# Patient Record
Sex: Male | Born: 1950 | Race: White | Hispanic: No | Marital: Single | Smoking: Never smoker
Health system: Southern US, Community
[De-identification: ages and names within clinical notes are randomized; demographics above are authoritative.]

## PROBLEM LIST (undated history)

## (undated) DIAGNOSIS — I480 Paroxysmal atrial fibrillation: Secondary | ICD-10-CM

## (undated) DIAGNOSIS — I272 Pulmonary hypertension, unspecified: Secondary | ICD-10-CM

## (undated) DIAGNOSIS — E1165 Type 2 diabetes mellitus with hyperglycemia: Secondary | ICD-10-CM

## (undated) DIAGNOSIS — I255 Ischemic cardiomyopathy: Secondary | ICD-10-CM

## (undated) DIAGNOSIS — N200 Calculus of kidney: Secondary | ICD-10-CM

## (undated) DIAGNOSIS — N183 Chronic kidney disease, stage 3 unspecified: Secondary | ICD-10-CM

## (undated) DIAGNOSIS — L409 Psoriasis, unspecified: Secondary | ICD-10-CM

## (undated) DIAGNOSIS — I10 Essential (primary) hypertension: Secondary | ICD-10-CM

## (undated) DIAGNOSIS — I219 Acute myocardial infarction, unspecified: Secondary | ICD-10-CM

## (undated) DIAGNOSIS — I5020 Unspecified systolic (congestive) heart failure: Secondary | ICD-10-CM

## (undated) DIAGNOSIS — I4891 Unspecified atrial fibrillation: Secondary | ICD-10-CM

## (undated) DIAGNOSIS — D509 Iron deficiency anemia, unspecified: Secondary | ICD-10-CM

## (undated) DIAGNOSIS — T7840XA Allergy, unspecified, initial encounter: Secondary | ICD-10-CM

## (undated) DIAGNOSIS — I429 Cardiomyopathy, unspecified: Secondary | ICD-10-CM

## (undated) DIAGNOSIS — Z87442 Personal history of urinary calculi: Secondary | ICD-10-CM

## (undated) DIAGNOSIS — N289 Disorder of kidney and ureter, unspecified: Secondary | ICD-10-CM

## (undated) DIAGNOSIS — IMO0002 Reserved for concepts with insufficient information to code with codable children: Secondary | ICD-10-CM

## (undated) DIAGNOSIS — H269 Unspecified cataract: Secondary | ICD-10-CM

## (undated) DIAGNOSIS — I509 Heart failure, unspecified: Secondary | ICD-10-CM

## (undated) DIAGNOSIS — F32A Depression, unspecified: Secondary | ICD-10-CM

## (undated) DIAGNOSIS — I2510 Atherosclerotic heart disease of native coronary artery without angina pectoris: Secondary | ICD-10-CM

## (undated) DIAGNOSIS — G4730 Sleep apnea, unspecified: Secondary | ICD-10-CM

## (undated) DIAGNOSIS — Z9109 Other allergy status, other than to drugs and biological substances: Secondary | ICD-10-CM

## (undated) DIAGNOSIS — I5042 Chronic combined systolic (congestive) and diastolic (congestive) heart failure: Secondary | ICD-10-CM

## (undated) DIAGNOSIS — H409 Unspecified glaucoma: Secondary | ICD-10-CM

## (undated) DIAGNOSIS — F419 Anxiety disorder, unspecified: Secondary | ICD-10-CM

## (undated) DIAGNOSIS — D649 Anemia, unspecified: Secondary | ICD-10-CM

## (undated) HISTORY — DX: Acute myocardial infarction, unspecified: I21.9

## (undated) HISTORY — DX: Anemia, unspecified: D64.9

## (undated) HISTORY — DX: Unspecified glaucoma: H40.9

## (undated) HISTORY — DX: Ischemic cardiomyopathy: I25.5

## (undated) HISTORY — PX: CARDIAC CATHETERIZATION: SHX172

## (undated) HISTORY — PX: EYE SURGERY: SHX253

## (undated) HISTORY — DX: Anxiety disorder, unspecified: F41.9

## (undated) HISTORY — PX: CORONARY ANGIOPLASTY WITH STENT PLACEMENT: SHX49

## (undated) HISTORY — DX: Allergy, unspecified, initial encounter: T78.40XA

## (undated) HISTORY — DX: Unspecified systolic (congestive) heart failure: I50.20

## (undated) HISTORY — DX: Depression, unspecified: F32.A

## (undated) HISTORY — DX: Paroxysmal atrial fibrillation: I48.0

## (undated) HISTORY — PX: NEPHRECTOMY: SHX65

## (undated) HISTORY — PX: KIDNEY SURGERY: SHX687

## (undated) HISTORY — DX: Atherosclerotic heart disease of native coronary artery without angina pectoris: I25.10

## (undated) HISTORY — DX: Unspecified cataract: H26.9

---

## 2015-06-18 IMAGING — US US RENAL
1 series · 14 of 25 positions shown · non-contrast
Comparison: Radiographs today.

CLINICAL DATA: Left-sided kidney stone.  Difficulty urinating.

EXAM:
RENAL / URINARY TRACT ULTRASOUND COMPLETE

[Series 1: us renal · 0.33mm/px · 14 of 63 slices shown]
[im 1/63]
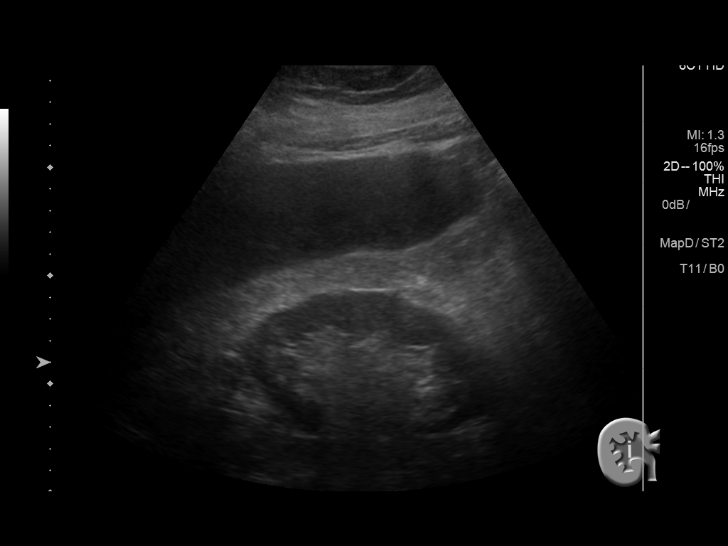
[im 6/63]
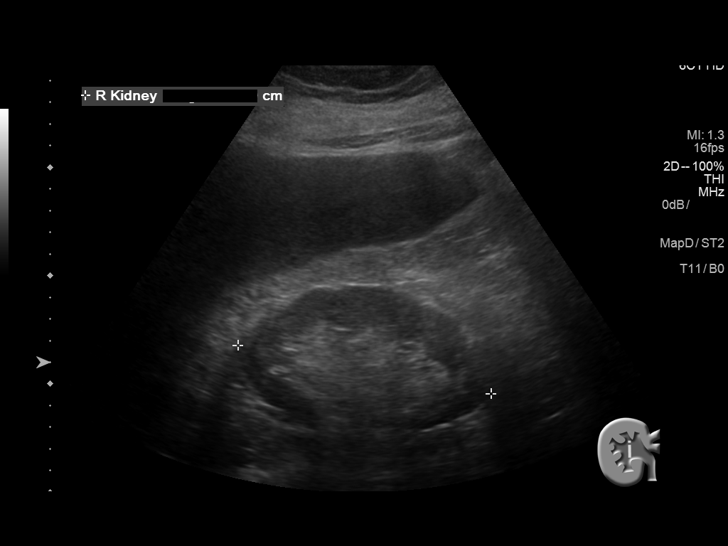
[im 11/63]
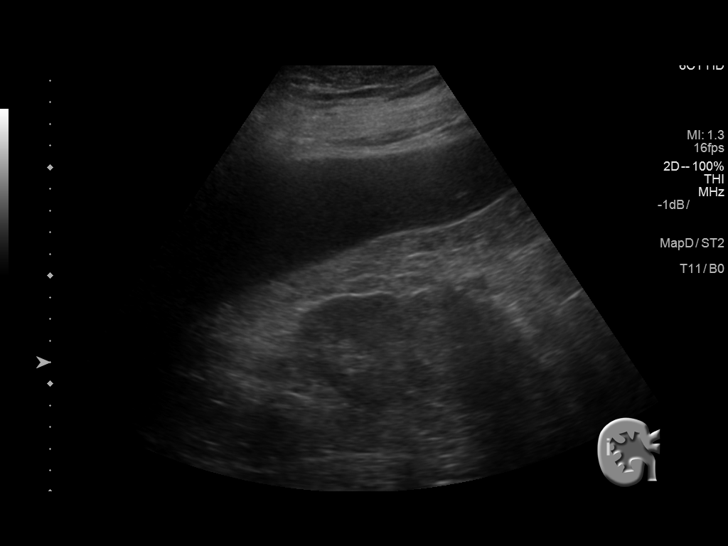
[im 16/63]
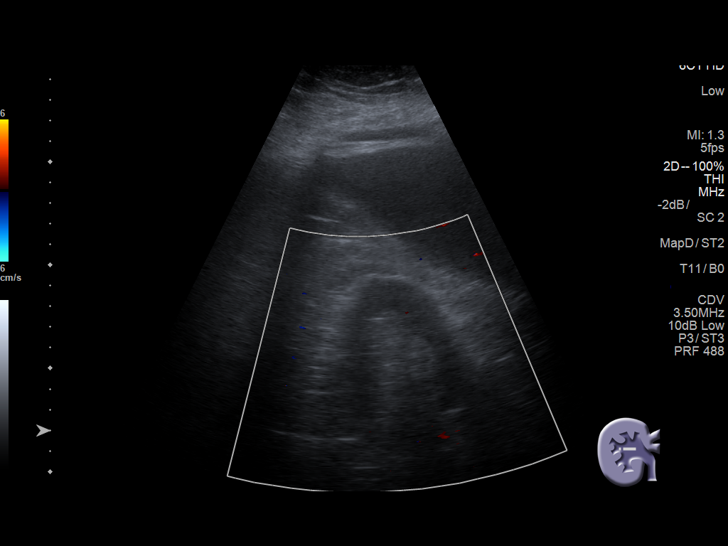
[im 21/63]
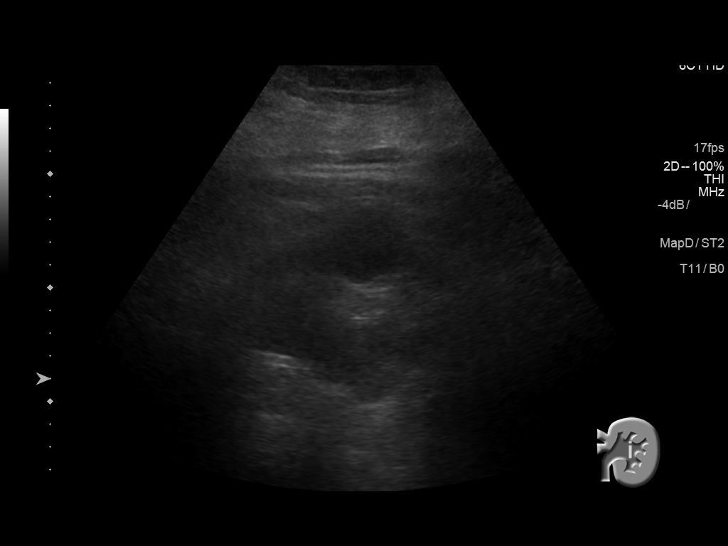
[im 24/63]
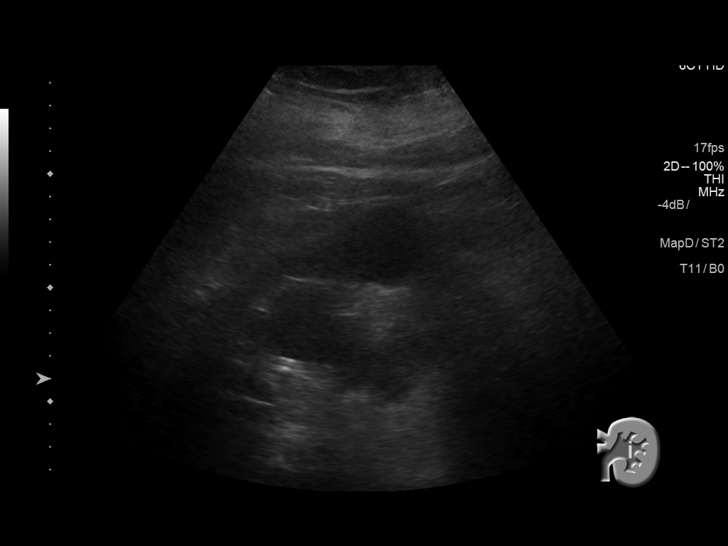
[im 29/63]
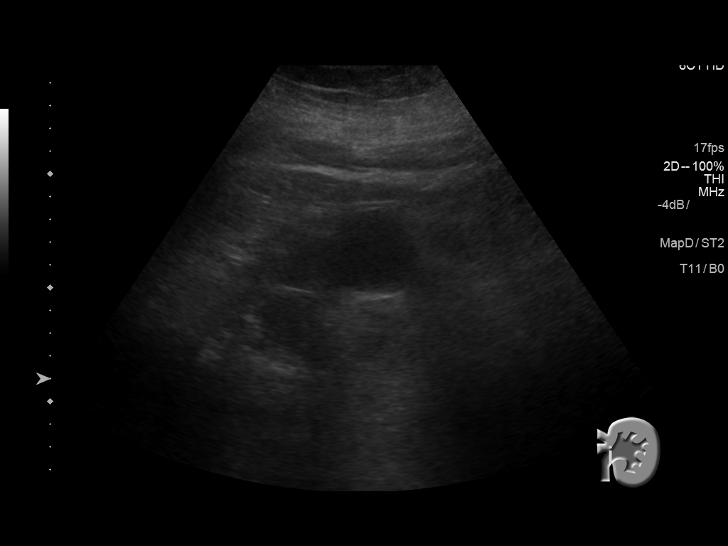
[im 34/63]
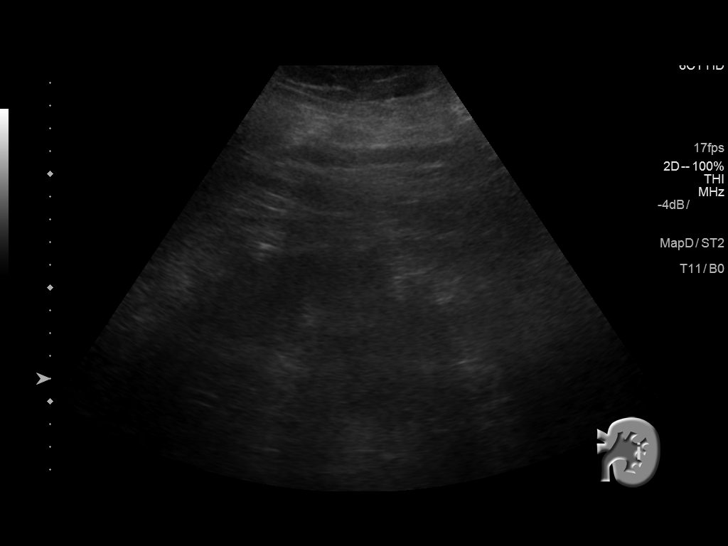
[im 39/63]
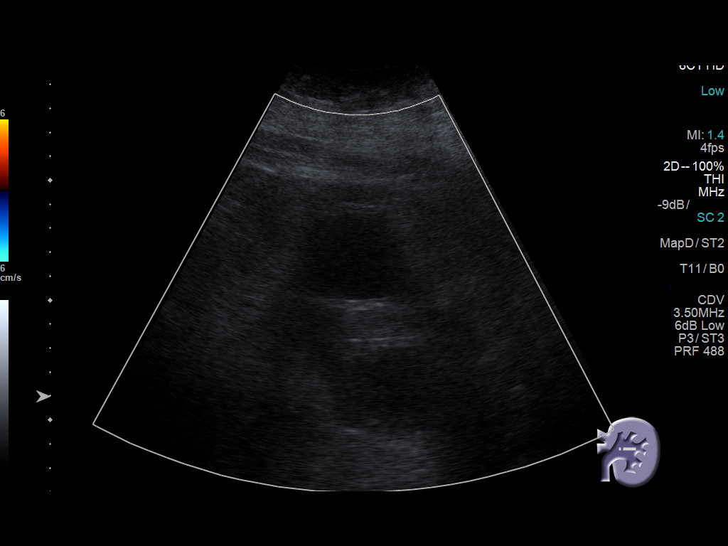
[im 42/63]
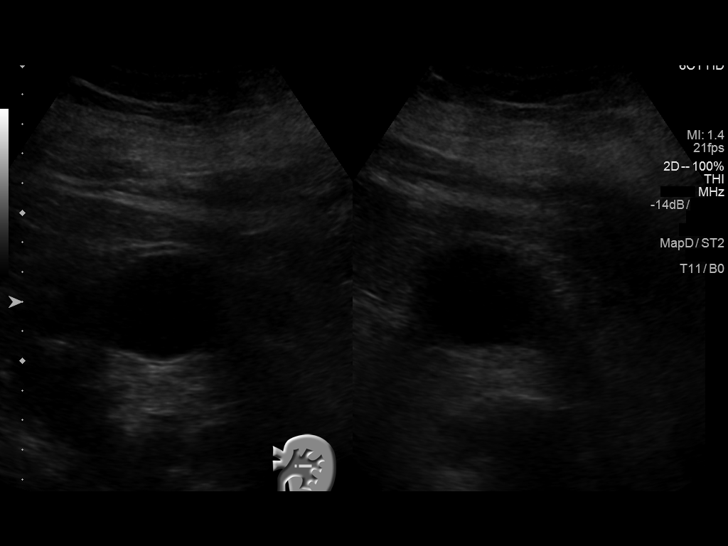
[im 47/63]
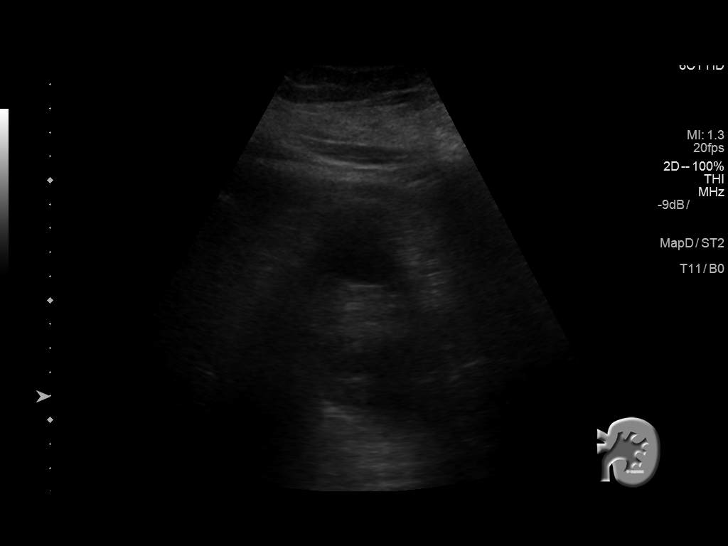
[im 52/63]
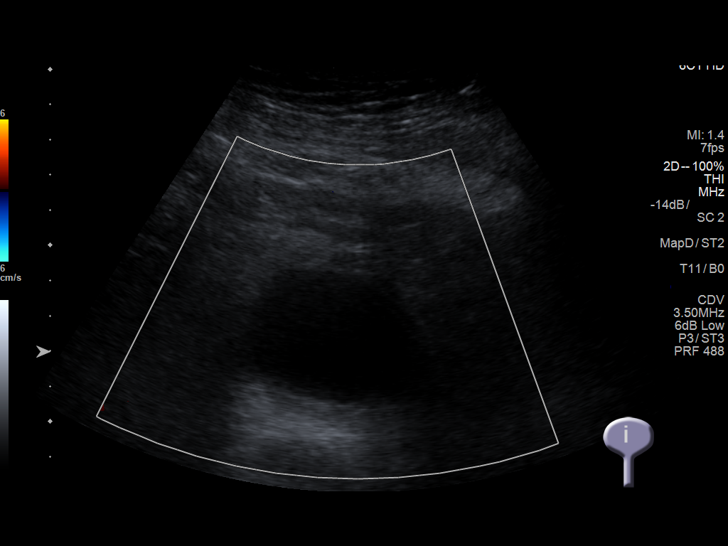
[im 57/63]
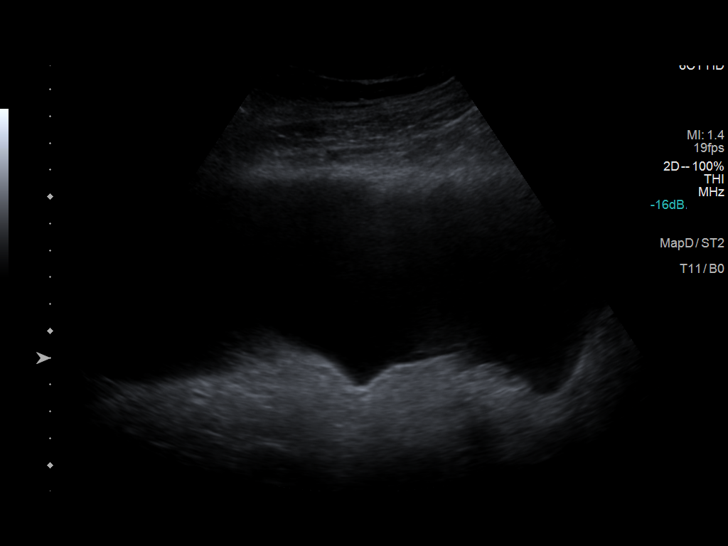
[im 63/63]
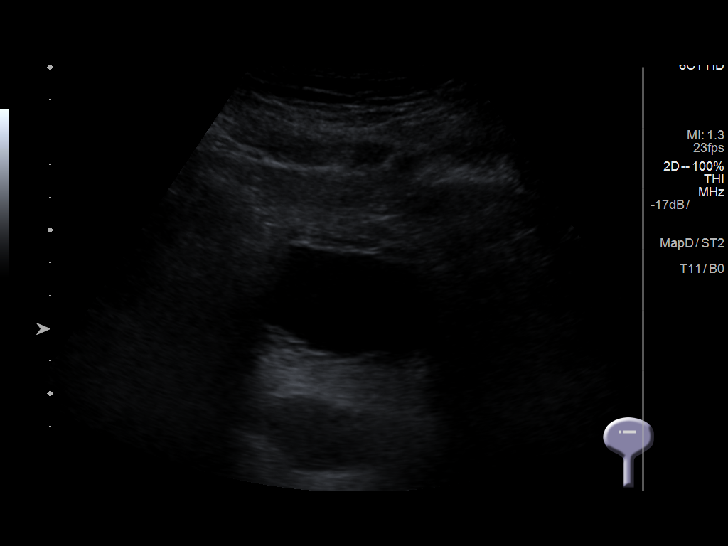

[14 of 25 positions shown; findings below may reference images not displayed]

FINDINGS: Right Kidney:

Length: 11.9 cm. Echogenicity within normal limits. No mass or
hydronephrosis visualized.

Left Kidney:

Length: 14.1 cm. The left kidney is suboptimally visualized, partly
obscured by bowel gas. There is a probable cyst in the interpolar
region measuring approximately 4.1 x 3.6 x 4.1 cm. The left renal
pelvis appears mildly distended. The calcifications demonstrated on
the radiographs are not well seen, probably within the ureter.

Bladder:

Appears normal for degree of bladder distention. Ureteral jets were
not visualized. Pelvic ascites noted.
IMPRESSION: 1. Suboptimal visualization of the left kidney due to bowel gas. The
left renal pelvis appears dilated, suggesting obstruction of the
left ureter by the calcifications demonstrated on radiographs.
2. Left renal cyst.
3. Pelvic ascites.
4. CT suggested for further evaluation.

## 2015-07-08 ENCOUNTER — Encounter: Payer: Self-pay | Admitting: Emergency Medicine

## 2015-07-08 ENCOUNTER — Emergency Department: Payer: Medicaid Other

## 2015-07-08 ENCOUNTER — Inpatient Hospital Stay
Admission: EM | Admit: 2015-07-08 | Discharge: 2015-08-01 | DRG: 286 | Disposition: A | Discharge status: Home Health | Payer: Medicaid Other | Attending: Internal Medicine | Admitting: Internal Medicine

## 2015-07-08 DIAGNOSIS — G4733 Obstructive sleep apnea (adult) (pediatric): Secondary | ICD-10-CM

## 2015-07-08 DIAGNOSIS — N200 Calculus of kidney: Secondary | ICD-10-CM

## 2015-07-08 DIAGNOSIS — N179 Acute kidney failure, unspecified: Secondary | ICD-10-CM | POA: Diagnosis present

## 2015-07-08 DIAGNOSIS — Z87442 Personal history of urinary calculi: Secondary | ICD-10-CM | POA: Diagnosis not present

## 2015-07-08 DIAGNOSIS — D696 Thrombocytopenia, unspecified: Secondary | ICD-10-CM | POA: Diagnosis present

## 2015-07-08 DIAGNOSIS — Z8249 Family history of ischemic heart disease and other diseases of the circulatory system: Secondary | ICD-10-CM | POA: Diagnosis not present

## 2015-07-08 DIAGNOSIS — I5023 Acute on chronic systolic (congestive) heart failure: Secondary | ICD-10-CM | POA: Diagnosis not present

## 2015-07-08 DIAGNOSIS — Z79899 Other long term (current) drug therapy: Secondary | ICD-10-CM

## 2015-07-08 DIAGNOSIS — Z7984 Long term (current) use of oral hypoglycemic drugs: Secondary | ICD-10-CM | POA: Diagnosis not present

## 2015-07-08 DIAGNOSIS — E871 Hypo-osmolality and hyponatremia: Secondary | ICD-10-CM | POA: Diagnosis not present

## 2015-07-08 DIAGNOSIS — I959 Hypotension, unspecified: Secondary | ICD-10-CM | POA: Diagnosis present

## 2015-07-08 DIAGNOSIS — I130 Hypertensive heart and chronic kidney disease with heart failure and stage 1 through stage 4 chronic kidney disease, or unspecified chronic kidney disease: Secondary | ICD-10-CM | POA: Diagnosis present

## 2015-07-08 DIAGNOSIS — E1122 Type 2 diabetes mellitus with diabetic chronic kidney disease: Secondary | ICD-10-CM | POA: Diagnosis present

## 2015-07-08 DIAGNOSIS — N132 Hydronephrosis with renal and ureteral calculous obstruction: Secondary | ICD-10-CM | POA: Diagnosis present

## 2015-07-08 DIAGNOSIS — K5900 Constipation, unspecified: Secondary | ICD-10-CM | POA: Diagnosis present

## 2015-07-08 DIAGNOSIS — Z955 Presence of coronary angioplasty implant and graft: Secondary | ICD-10-CM

## 2015-07-08 DIAGNOSIS — F419 Anxiety disorder, unspecified: Secondary | ICD-10-CM | POA: Diagnosis present

## 2015-07-08 DIAGNOSIS — I272 Other secondary pulmonary hypertension: Secondary | ICD-10-CM | POA: Diagnosis present

## 2015-07-08 DIAGNOSIS — N183 Chronic kidney disease, stage 3 (moderate): Secondary | ICD-10-CM | POA: Diagnosis present

## 2015-07-08 DIAGNOSIS — I878 Other specified disorders of veins: Secondary | ICD-10-CM | POA: Diagnosis present

## 2015-07-08 DIAGNOSIS — I5041 Acute combined systolic (congestive) and diastolic (congestive) heart failure: Secondary | ICD-10-CM | POA: Diagnosis not present

## 2015-07-08 DIAGNOSIS — I951 Orthostatic hypotension: Secondary | ICD-10-CM | POA: Diagnosis present

## 2015-07-08 DIAGNOSIS — I248 Other forms of acute ischemic heart disease: Secondary | ICD-10-CM | POA: Diagnosis present

## 2015-07-08 DIAGNOSIS — IMO0002 Reserved for concepts with insufficient information to code with codable children: Secondary | ICD-10-CM

## 2015-07-08 DIAGNOSIS — I1310 Hypertensive heart and chronic kidney disease without heart failure, with stage 1 through stage 4 chronic kidney disease, or unspecified chronic kidney disease: Secondary | ICD-10-CM | POA: Diagnosis present

## 2015-07-08 DIAGNOSIS — I340 Nonrheumatic mitral (valve) insufficiency: Secondary | ICD-10-CM | POA: Diagnosis present

## 2015-07-08 DIAGNOSIS — I2510 Atherosclerotic heart disease of native coronary artery without angina pectoris: Secondary | ICD-10-CM | POA: Diagnosis present

## 2015-07-08 DIAGNOSIS — I472 Ventricular tachycardia: Secondary | ICD-10-CM | POA: Diagnosis not present

## 2015-07-08 DIAGNOSIS — I5043 Acute on chronic combined systolic (congestive) and diastolic (congestive) heart failure: Secondary | ICD-10-CM | POA: Diagnosis present

## 2015-07-08 DIAGNOSIS — I255 Ischemic cardiomyopathy: Secondary | ICD-10-CM | POA: Diagnosis present

## 2015-07-08 DIAGNOSIS — I4891 Unspecified atrial fibrillation: Secondary | ICD-10-CM | POA: Diagnosis not present

## 2015-07-08 DIAGNOSIS — R7989 Other specified abnormal findings of blood chemistry: Secondary | ICD-10-CM

## 2015-07-08 DIAGNOSIS — I509 Heart failure, unspecified: Secondary | ICD-10-CM

## 2015-07-08 DIAGNOSIS — Z452 Encounter for adjustment and management of vascular access device: Secondary | ICD-10-CM

## 2015-07-08 DIAGNOSIS — E1165 Type 2 diabetes mellitus with hyperglycemia: Secondary | ICD-10-CM | POA: Diagnosis present

## 2015-07-08 DIAGNOSIS — I480 Paroxysmal atrial fibrillation: Secondary | ICD-10-CM | POA: Diagnosis present

## 2015-07-08 DIAGNOSIS — N1330 Unspecified hydronephrosis: Secondary | ICD-10-CM

## 2015-07-08 DIAGNOSIS — R778 Other specified abnormalities of plasma proteins: Secondary | ICD-10-CM | POA: Diagnosis present

## 2015-07-08 DIAGNOSIS — I5021 Acute systolic (congestive) heart failure: Secondary | ICD-10-CM | POA: Diagnosis not present

## 2015-07-08 DIAGNOSIS — N189 Chronic kidney disease, unspecified: Secondary | ICD-10-CM

## 2015-07-08 DIAGNOSIS — Z936 Other artificial openings of urinary tract status: Secondary | ICD-10-CM

## 2015-07-08 DIAGNOSIS — Z6841 Body Mass Index (BMI) 40.0 and over, adult: Secondary | ICD-10-CM

## 2015-07-08 DIAGNOSIS — R0600 Dyspnea, unspecified: Secondary | ICD-10-CM | POA: Diagnosis not present

## 2015-07-08 DIAGNOSIS — E785 Hyperlipidemia, unspecified: Secondary | ICD-10-CM | POA: Diagnosis present

## 2015-07-08 DIAGNOSIS — T829XXA Unspecified complication of cardiac and vascular prosthetic device, implant and graft, initial encounter: Secondary | ICD-10-CM

## 2015-07-08 HISTORY — DX: Sleep apnea, unspecified: G47.30

## 2015-07-08 HISTORY — DX: Unspecified atrial fibrillation: I48.91

## 2015-07-08 HISTORY — DX: Chronic kidney disease, stage 3 (moderate): N18.3

## 2015-07-08 HISTORY — DX: Chronic kidney disease, stage 3 unspecified: N18.30

## 2015-07-08 HISTORY — DX: Essential (primary) hypertension: I10

## 2015-07-08 HISTORY — DX: Chronic combined systolic (congestive) and diastolic (congestive) heart failure: I50.42

## 2015-07-08 HISTORY — DX: Heart failure, unspecified: I50.9

## 2015-07-08 HISTORY — DX: Disorder of kidney and ureter, unspecified: N28.9

## 2015-07-08 HISTORY — DX: Type 2 diabetes mellitus with hyperglycemia: E11.65

## 2015-07-08 HISTORY — DX: Reserved for concepts with insufficient information to code with codable children: IMO0002

## 2015-07-08 LAB — CBC
HCT: 41.2 % (ref 40.0–52.0)
Hemoglobin: 13.4 g/dL (ref 13.0–18.0)
MCH: 32.3 pg (ref 26.0–34.0)
MCHC: 32.5 g/dL (ref 32.0–36.0)
MCV: 99.4 fL (ref 80.0–100.0)
PLATELETS: 178 10*3/uL (ref 150–440)
RBC: 4.15 MIL/uL — AB (ref 4.40–5.90)
RDW: 14.8 % — ABNORMAL HIGH (ref 11.5–14.5)
WBC: 6.3 10*3/uL (ref 3.8–10.6)

## 2015-07-08 LAB — URINALYSIS COMPLETE WITH MICROSCOPIC (ARMC ONLY)
Bilirubin Urine: NEGATIVE
GLUCOSE, UA: NEGATIVE mg/dL
HGB URINE DIPSTICK: NEGATIVE
Ketones, ur: NEGATIVE mg/dL
Nitrite: NEGATIVE
PROTEIN: NEGATIVE mg/dL
Specific Gravity, Urine: 1.011 (ref 1.005–1.030)
pH: 5 (ref 5.0–8.0)

## 2015-07-08 LAB — BASIC METABOLIC PANEL
Anion gap: 11 (ref 5–15)
BUN: 33 mg/dL — ABNORMAL HIGH (ref 6–20)
CALCIUM: 9.7 mg/dL (ref 8.9–10.3)
CO2: 30 mmol/L (ref 22–32)
CREATININE: 1.48 mg/dL — AB (ref 0.61–1.24)
Chloride: 98 mmol/L — ABNORMAL LOW (ref 101–111)
GFR calc non Af Amer: 49 mL/min — ABNORMAL LOW (ref >60)
GFR, EST AFRICAN AMERICAN: 56 mL/min — AB (ref >60)
GLUCOSE: 318 mg/dL — AB (ref 65–99)
Potassium: 4.7 mmol/L (ref 3.5–5.1)
Sodium: 139 mmol/L (ref 135–145)

## 2015-07-08 LAB — TROPONIN I: TROPONIN I: 0.11 ng/mL — AB (ref <0.031)

## 2015-07-08 LAB — GLUCOSE, CAPILLARY: GLUCOSE-CAPILLARY: 251 mg/dL — AB (ref 65–99)

## 2015-07-08 IMAGING — CR DG CHEST 2V
1 series · 2 of 2 positions shown · non-contrast
Comparison: None.

CLINICAL DATA: Patient with a history of chf and cardiomyopathy.
Patient states that since [REDACTED] he has developed shortness of
breath, bilateral leg swelling and vertigo. Patient states that over
the last week the symptoms have become worse.

EXAM:
CHEST  2 VIEW

[Series 1: dg chest 2 view · 0.14mm/px · 2 of 2 slices shown]
[im 1/2]
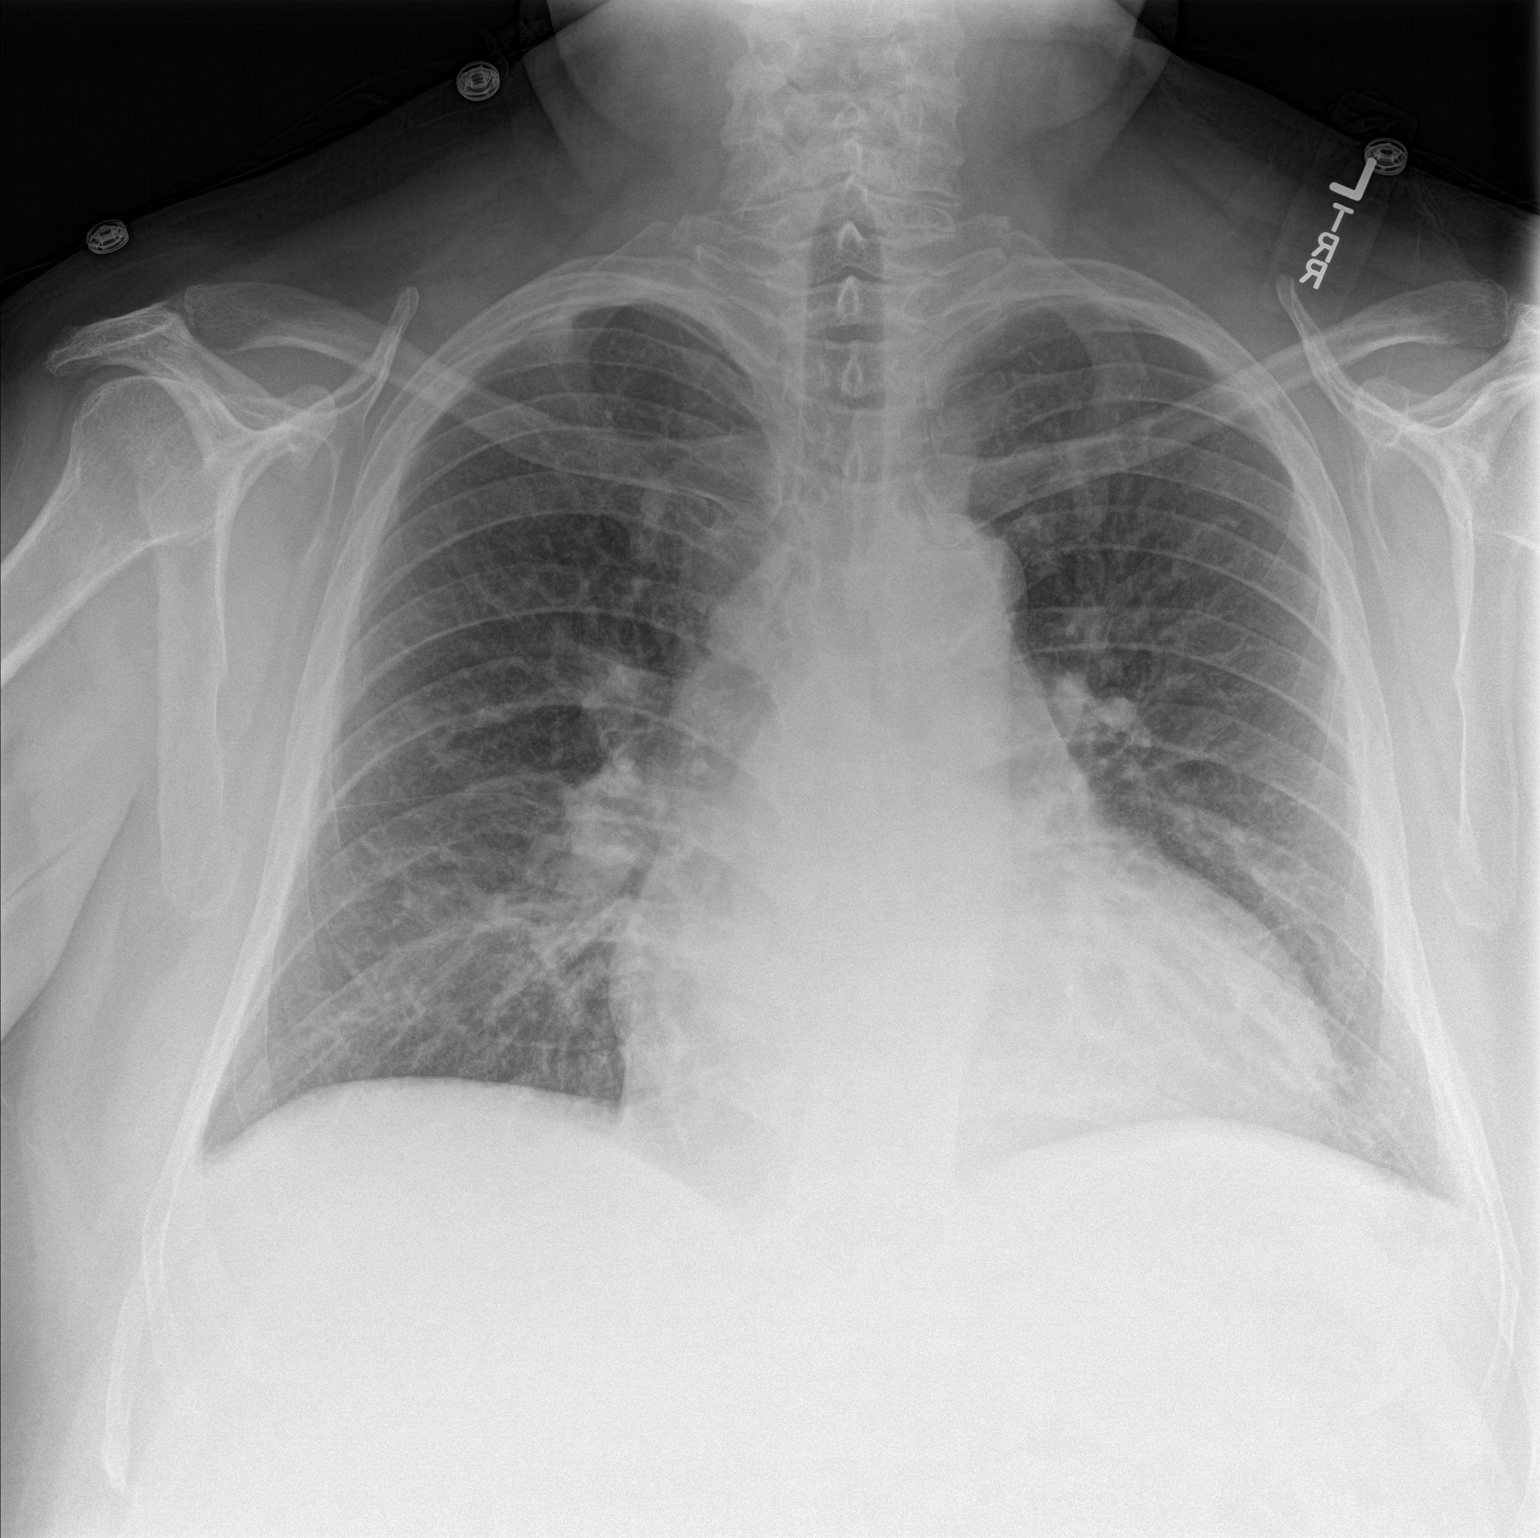
[im 2/2]
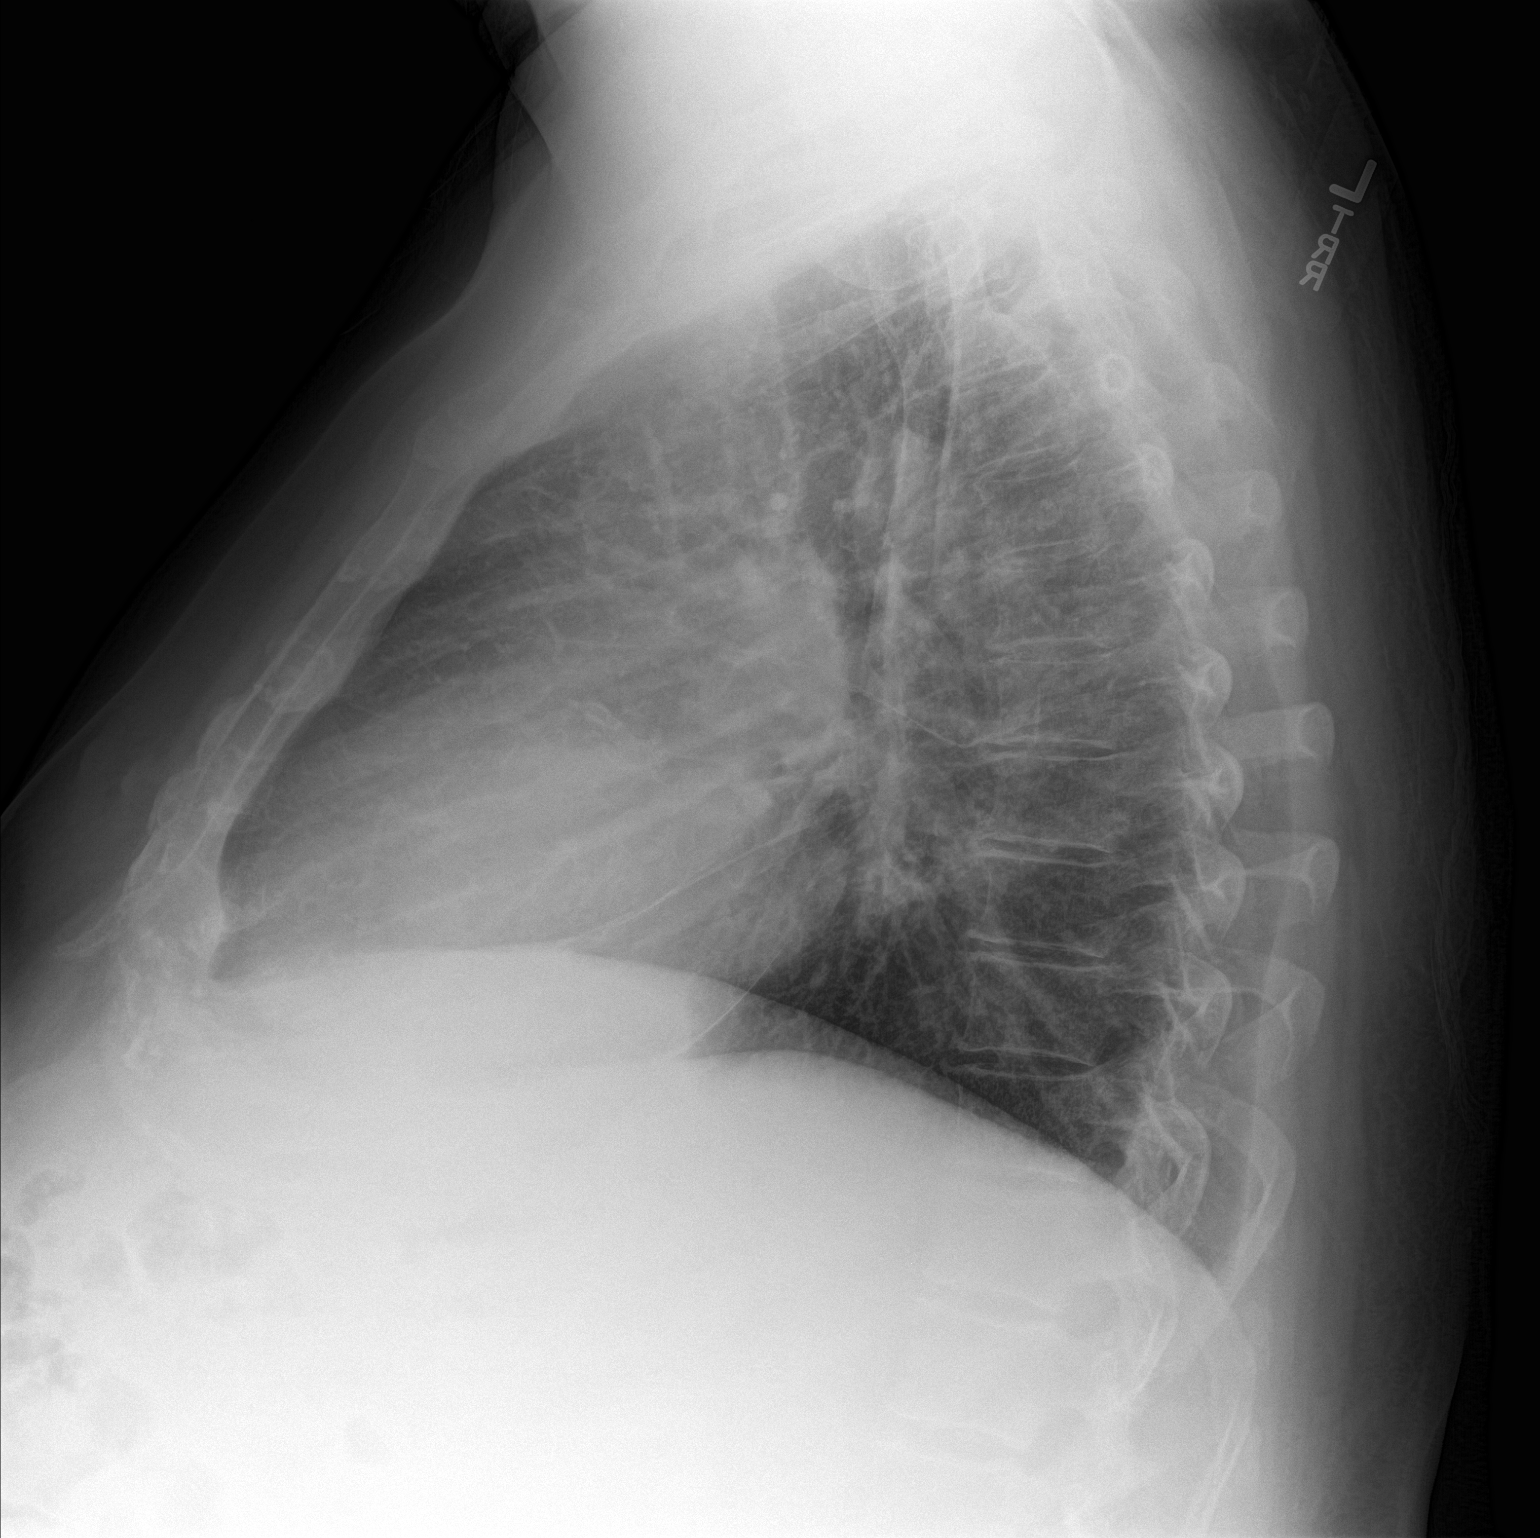

[2 of 2 positions shown; findings below may reference images not displayed]

FINDINGS: Normal cardiac silhouette. There is central venous congestion. No
effusion, infiltrate pneumothorax. No pleural fluid. No acute
osseous abnormality.
IMPRESSION: Central venous congestion.  No pleural floor no overt edema.

## 2015-07-08 MED ORDER — INSULIN ASPART 100 UNIT/ML ~~LOC~~ SOLN
0.0000 [IU] | Freq: Every day | SUBCUTANEOUS | Status: DC
  Administered 2015-07-08: 100 [IU] via SUBCUTANEOUS
  Administered 2015-07-09: 2 [IU] via SUBCUTANEOUS
  Administered 2015-07-10: 3 [IU] via SUBCUTANEOUS
  Administered 2015-07-12 – 2015-07-16 (×4): 2 [IU] via SUBCUTANEOUS
  Administered 2015-07-17: 3 [IU] via SUBCUTANEOUS
  Administered 2015-07-18 – 2015-07-31 (×3): 2 [IU] via SUBCUTANEOUS
  Filled 2015-07-08: qty 3
  Filled 2015-07-08: qty 2
  Filled 2015-07-08: qty 5
  Filled 2015-07-08: qty 2
  Filled 2015-07-08: qty 3
  Filled 2015-07-08 (×5): qty 2
  Filled 2015-07-08: qty 5
  Filled 2015-07-08 (×3): qty 2

## 2015-07-08 MED ORDER — METOPROLOL SUCCINATE ER 25 MG PO TB24
25.0000 mg | ORAL_TABLET | Freq: Every day | ORAL | Status: DC
  Administered 2015-07-08: 25 mg via ORAL
  Filled 2015-07-08: qty 1

## 2015-07-08 MED ORDER — SODIUM CHLORIDE 0.9% FLUSH
3.0000 mL | Freq: Two times a day (BID) | INTRAVENOUS | Status: DC
  Administered 2015-07-08 – 2015-08-01 (×38): 3 mL via INTRAVENOUS

## 2015-07-08 MED ORDER — ONDANSETRON HCL 4 MG PO TABS
4.0000 mg | ORAL_TABLET | Freq: Four times a day (QID) | ORAL | Status: DC | PRN
  Administered 2015-07-11: 4 mg via ORAL
  Filled 2015-07-08: qty 1

## 2015-07-08 MED ORDER — SODIUM CHLORIDE 0.9 % IV SOLN: 250.0000 mL | INTRAVENOUS | Status: DC | PRN

## 2015-07-08 MED ORDER — LISINOPRIL 5 MG PO TABS
5.0000 mg | ORAL_TABLET | Freq: Every day | ORAL | Status: DC
  Administered 2015-07-09 – 2015-07-12 (×4): 5 mg via ORAL
  Filled 2015-07-08 (×4): qty 1

## 2015-07-08 MED ORDER — ACETAMINOPHEN 325 MG PO TABS
650.0000 mg | ORAL_TABLET | Freq: Four times a day (QID) | ORAL | Status: DC | PRN
  Administered 2015-07-10 – 2015-07-16 (×3): 650 mg via ORAL
  Filled 2015-07-08 (×4): qty 2

## 2015-07-08 MED ORDER — ONDANSETRON HCL 4 MG/2ML IJ SOLN
4.0000 mg | Freq: Four times a day (QID) | INTRAMUSCULAR | Status: DC | PRN
  Administered 2015-07-12: 4 mg via INTRAVENOUS
  Filled 2015-07-08 (×2): qty 2

## 2015-07-08 MED ORDER — SODIUM CHLORIDE 0.9% FLUSH
3.0000 mL | INTRAVENOUS | Status: DC | PRN
  Administered 2015-07-24 – 2015-07-29 (×2): 3 mL via INTRAVENOUS
  Filled 2015-07-08 (×2): qty 3

## 2015-07-08 MED ORDER — POTASSIUM CHLORIDE CRYS ER 10 MEQ PO TBCR
10.0000 meq | EXTENDED_RELEASE_TABLET | Freq: Every day | ORAL | Status: DC
  Administered 2015-07-09 – 2015-07-14 (×6): 10 meq via ORAL
  Filled 2015-07-08 (×5): qty 1

## 2015-07-08 MED ORDER — ASPIRIN 81 MG PO CHEW
81.0000 mg | CHEWABLE_TABLET | Freq: Every day | ORAL | Status: DC
  Administered 2015-07-09 – 2015-07-18 (×10): 81 mg via ORAL
  Filled 2015-07-08 (×10): qty 1

## 2015-07-08 MED ORDER — ALBUTEROL SULFATE (2.5 MG/3ML) 0.083% IN NEBU
2.5000 mg | INHALATION_SOLUTION | RESPIRATORY_TRACT | Status: DC | PRN
  Administered 2015-07-11: 2.5 mg via RESPIRATORY_TRACT
  Filled 2015-07-08 (×2): qty 3

## 2015-07-08 MED ORDER — SPIRONOLACTONE 25 MG PO TABS
25.0000 mg | ORAL_TABLET | Freq: Every day | ORAL | Status: DC
  Administered 2015-07-08 – 2015-07-31 (×23): 25 mg via ORAL
  Filled 2015-07-08 (×23): qty 1

## 2015-07-08 MED ORDER — ACETAMINOPHEN 650 MG RE SUPP: 650.0000 mg | Freq: Four times a day (QID) | RECTAL | Status: DC | PRN

## 2015-07-08 MED ORDER — HEPARIN SODIUM (PORCINE) 5000 UNIT/ML IJ SOLN
5000.0000 [IU] | Freq: Three times a day (TID) | INTRAMUSCULAR | Status: DC
  Administered 2015-07-08 – 2015-07-10 (×5): 5000 [IU] via SUBCUTANEOUS
  Filled 2015-07-08 (×5): qty 1

## 2015-07-08 MED ORDER — FUROSEMIDE 10 MG/ML IJ SOLN
40.0000 mg | Freq: Two times a day (BID) | INTRAMUSCULAR | Status: DC
  Administered 2015-07-08 – 2015-07-09 (×2): 40 mg via INTRAVENOUS
  Filled 2015-07-08 (×2): qty 4

## 2015-07-08 MED ORDER — INSULIN ASPART 100 UNIT/ML ~~LOC~~ SOLN
0.0000 [IU] | Freq: Three times a day (TID) | SUBCUTANEOUS | Status: DC
  Administered 2015-07-09: 7 [IU] via SUBCUTANEOUS
  Administered 2015-07-09: 2 [IU] via SUBCUTANEOUS
  Administered 2015-07-09: 3 [IU] via SUBCUTANEOUS
  Administered 2015-07-10 (×2): 2 [IU] via SUBCUTANEOUS
  Administered 2015-07-10 – 2015-07-11 (×2): 5 [IU] via SUBCUTANEOUS
  Administered 2015-07-11: 1 [IU] via SUBCUTANEOUS
  Administered 2015-07-11: 5 [IU] via SUBCUTANEOUS
  Administered 2015-07-12 (×3): 3 [IU] via SUBCUTANEOUS
  Administered 2015-07-13: 5 [IU] via SUBCUTANEOUS
  Administered 2015-07-13: 2 [IU] via SUBCUTANEOUS
  Administered 2015-07-13: 5 [IU] via SUBCUTANEOUS
  Administered 2015-07-14: 3 [IU] via SUBCUTANEOUS
  Administered 2015-07-14 (×2): 1 [IU] via SUBCUTANEOUS
  Administered 2015-07-15: 5 [IU] via SUBCUTANEOUS
  Administered 2015-07-15: 2 [IU] via SUBCUTANEOUS
  Administered 2015-07-15: 1 [IU] via SUBCUTANEOUS
  Administered 2015-07-16 (×2): 2 [IU] via SUBCUTANEOUS
  Administered 2015-07-17: 3 [IU] via SUBCUTANEOUS
  Administered 2015-07-17: 2 [IU] via SUBCUTANEOUS
  Administered 2015-07-17: 5 [IU] via SUBCUTANEOUS
  Administered 2015-07-18: 2 [IU] via SUBCUTANEOUS
  Administered 2015-07-18: 5 [IU] via SUBCUTANEOUS
  Administered 2015-07-18: 3 [IU] via SUBCUTANEOUS
  Administered 2015-07-19: 2 [IU] via SUBCUTANEOUS
  Administered 2015-07-19: 1 [IU] via SUBCUTANEOUS
  Administered 2015-07-19: 3 [IU] via SUBCUTANEOUS
  Administered 2015-07-20: 2 [IU] via SUBCUTANEOUS
  Administered 2015-07-20: 3 [IU] via SUBCUTANEOUS
  Administered 2015-07-21: 1 [IU] via SUBCUTANEOUS
  Administered 2015-07-21 – 2015-07-24 (×5): 3 [IU] via SUBCUTANEOUS
  Administered 2015-07-24 – 2015-07-25 (×2): 2 [IU] via SUBCUTANEOUS
  Administered 2015-07-26: 1 [IU] via SUBCUTANEOUS
  Administered 2015-07-26: 2 [IU] via SUBCUTANEOUS
  Administered 2015-07-27: 3 [IU] via SUBCUTANEOUS
  Administered 2015-07-27: 2 [IU] via SUBCUTANEOUS
  Administered 2015-07-28 (×2): 1 [IU] via SUBCUTANEOUS
  Administered 2015-07-28 – 2015-07-30 (×3): 3 [IU] via SUBCUTANEOUS
  Administered 2015-07-31: 1 [IU] via SUBCUTANEOUS
  Administered 2015-07-31: 3 [IU] via SUBCUTANEOUS
  Administered 2015-08-01: 2 [IU] via SUBCUTANEOUS
  Filled 2015-07-08: qty 1
  Filled 2015-07-08 (×4): qty 2
  Filled 2015-07-08: qty 1
  Filled 2015-07-08 (×3): qty 2
  Filled 2015-07-08: qty 3
  Filled 2015-07-08: qty 4
  Filled 2015-07-08: qty 5
  Filled 2015-07-08: qty 2
  Filled 2015-07-08: qty 3
  Filled 2015-07-08: qty 2
  Filled 2015-07-08: qty 5
  Filled 2015-07-08: qty 2
  Filled 2015-07-08 (×2): qty 3
  Filled 2015-07-08: qty 5
  Filled 2015-07-08 (×2): qty 3
  Filled 2015-07-08: qty 5
  Filled 2015-07-08 (×2): qty 2
  Filled 2015-07-08: qty 3
  Filled 2015-07-08: qty 1
  Filled 2015-07-08: qty 3
  Filled 2015-07-08: qty 5
  Filled 2015-07-08: qty 2
  Filled 2015-07-08 (×3): qty 1
  Filled 2015-07-08: qty 5
  Filled 2015-07-08: qty 2
  Filled 2015-07-08: qty 8
  Filled 2015-07-08: qty 5
  Filled 2015-07-08 (×2): qty 3
  Filled 2015-07-08: qty 1
  Filled 2015-07-08: qty 5
  Filled 2015-07-08: qty 2
  Filled 2015-07-08 (×4): qty 3
  Filled 2015-07-08: qty 5
  Filled 2015-07-08: qty 1

## 2015-07-08 MED ORDER — FUROSEMIDE 10 MG/ML IJ SOLN
60.0000 mg | Freq: Once | INTRAMUSCULAR | Status: AC
  Administered 2015-07-08: 60 mg via INTRAVENOUS
  Filled 2015-07-08: qty 8

## 2015-07-08 MED ORDER — ATORVASTATIN CALCIUM 20 MG PO TABS
40.0000 mg | ORAL_TABLET | Freq: Every day | ORAL | Status: DC
  Administered 2015-07-09 – 2015-07-31 (×21): 40 mg via ORAL
  Filled 2015-07-08 (×20): qty 2

## 2015-07-08 NOTE — ED Provider Notes (Signed)
[facility] Emergency Department Provider Note  Time seen: 9:07 PM  I have reviewed the triage vital signs and the nursing notes.   HISTORY  Chief Complaint Shortness of Breath    HPI [name] is a 65 y.o. male with a past medical history of CAD, CHF, cream out of the, CK D, diabetes, presents the emergency department with increased shortness of breath. According to the patient for the past one month he has had progressively worsening shortness breath, however he states over the past 3-4 days it has worsened acutely. Patient is also been noticing increased fluid accumulation on his legs. Patient has never had accumulated fluid. He was prescribed Lasix 40 mg tablets to be taken when necessary. He states over the past 30 days he has been taking them daily, over the past several days he has been taking one and a half tablets daily, and continues to feel worsening shortness breath. He now has to sleep in a recliner as he cannot lay flat in the bed due to shortness of breath. Denies any chest pain. Denies fever or cough. Patient states he can only walk very short distances without stopping to rest due to the dyspnea. Describes the dyspnea is mild wall resting, severe while exerting.     Past Medical History  Diagnosis Date  . CHF (congestive heart failure) ([place])   . Sleep apnea   . Diabetes ([place])   . Renal disorder     kidney stone    There are no active problems to display for this patient.   Past Surgical History  Procedure Laterality Date  . Coronary angioplasty with stent placement    . Kidney surgery      No current outpatient prescriptions on file.  Allergies Review of patient's allergies indicates no known allergies.  No family history on file.  Social History Social History  Substance Use Topics  . Smoking status: Never Smoker   . Smokeless tobacco: None  . Alcohol Use: Yes     Comment: occasionally    Review of  Systems Constitutional: Negative for fever Cardiovascular: Negative for chest pain. Respiratory: Positive shortness of breath Gastrointestinal: Negative for abdominal pain Neurological: Negative for headache 10-point ROS otherwise negative.  ____________________________________________   PHYSICAL EXAM:  VITAL SIGNS: ED Triage Vitals  Enc Vitals Group     BP 07/08/15 2002 134/88 mmHg     Pulse Rate 07/08/15 2002 107     Resp 07/08/15 2002 20     Temp 07/08/15 2002 97.6 F (36.4 C)     Temp Source 07/08/15 2002 Oral     SpO2 07/08/15 2002 97 %     Weight 07/08/15 2002 315 lb (142.883 kg)     Height 07/08/15 2002 6' (1.829 m)     Head Cir --      Peak Flow --      Pain Score --      Pain Loc --      Pain Edu? --      Excl. in [place]? --     Constitutional: Alert and oriented. Well appearing and in no distress. Eyes: Normal exam ENT   Head: Normocephalic and atraumatic.   Mouth/Throat: Mucous membranes are moist. Cardiovascular: Normal rate, regular rhythm. No murmur Respiratory: Normal respiratory effort without tachypnea nor retractions. Breath sounds are clear  Gastrointestinal: Soft and nontender. Obese. Musculoskeletal: Nontender with normal range of motion in all extremities. 3+ lower extremity pitting edema bilaterally. Skin color changes consistent with  venous stasis, no tenderness palpation. Neurologic:  Normal speech and language. No gross focal neurologic deficits Skin:  Skin is warm, dry and intact.  Psychiatric: Mood and affect are normal. Speech and behavior are normal.   ____________________________________________    EKG  EKG reviewed and interpreted by myself shows sinus tachycardia 110 bpm, slightly widened QRS, left axis deviation, nonspecific ST changes. No ST elevations.  ____________________________________________    RADIOLOGY  Chest x-ray shows vascular congestion without overt edema.  INITIAL IMPRESSION / ASSESSMENT AND PLAN / ED  COURSE  Pertinent labs & imaging results that were available during my care of the patient were reviewed by me and considered in my medical decision making (see chart for details).  Patient presents the emergency department worsening shortness breath of the past 1 month, acutely worse over the past several days. Patient has 3+ lower extremity pitting edema. Chest x-ray consistent with vascular congestion. Labs consistent with elevated troponin 0.11. The patient is now to the point where he needs to sit upright in a recliner to sleep, cannot lie back or lay flat. States he can only walk very short distances for having to rest due to dyspnea. Given the patient's elevated troponin, exam consistent with CHF exacerbation, and the fact the patient recently moved to [place] has no primary care or cardiology follow-up we will admit to the hospital for IV diuresis. Patient does 60 mg of IV Lasix in the emergency department.  ____________________________________________   FINAL CLINICAL IMPRESSION(S) / ED DIAGNOSES  CHF exacerbation Dyspnea   Harvest Dark, MD 07/08/15 2111

## 2015-07-08 NOTE — H&P (Signed)
[place] [facility] [place] NAME: [name]    MR#:  [id number]  DATE OF BIRTH:  09/19/1950  DATE OF ADMISSION:  07/08/2015  PRIMARY CARE PHYSICIAN: No PCP Per Patient   REQUESTING/REFERRING PHYSICIAN: Harvest Dark, MD  CHIEF COMPLAINT:   Chief Complaint  Patient presents with  . Shortness of Breath   cough, shortness of breath and leg swelling for the past 6 weeks  HISTORY OF PRESENT ILLNESS:  [name]  is a 65 y.o. male with a known history of CAD, CHF, cardiomyopathy, CKD, diabetes. The patient has had cough, shortness of breath and leg swelling for the past 6 weeks, which has been worsening for the past the 3-4 days. He also complains of orthopnea and nocturnal dyspnea. He has worsening dyspnea on exertion. But he denies any fever or chills. He was prescribed Lasix 40 mg by mouth daily when necessary and has to take it daily for the past several days.  PAST MEDICAL HISTORY:   Past Medical History  Diagnosis Date  . CHF (congestive heart failure) ([place])   . Sleep apnea   . Diabetes ([place])   . Renal disorder     kidney stone  . Hypertension     PAST SURGICAL HISTORY:   Past Surgical History  Procedure Laterality Date  . Coronary angioplasty with stent placement    . Kidney surgery      SOCIAL HISTORY:   Social History  Substance Use Topics  . Smoking status: Never Smoker   . Smokeless tobacco: Not on file  . Alcohol Use: Yes     Comment: occasionally    FAMILY HISTORY:   Family History  Problem Relation Age of Onset  . Heart failure Father   . Heart attack Brother     DRUG ALLERGIES:  No Known Allergies  REVIEW OF SYSTEMS:  CONSTITUTIONAL: No fever, has weakness.  EYES: No blurred or double vision.  EARS, NOSE, AND THROAT: No tinnitus or ear pain.  RESPIRATORY: Has cough, shortness of breath, no wheezing or hemoptysis.  CARDIOVASCULAR: No chest pain, orthopnea, has leg edema.  GASTROINTESTINAL: No  nausea, vomiting, diarrhea or abdominal pain.  GENITOURINARY: No dysuria, hematuria.  ENDOCRINE: No polyuria, nocturia,  HEMATOLOGY: No anemia, easy bruising or bleeding SKIN: No rash or lesion. MUSCULOSKELETAL: No joint pain or arthritis.   NEUROLOGIC: No tingling, numbness, weakness.  PSYCHIATRY: No anxiety or depression.   MEDICATIONS AT HOME:   Prior to Admission medications   Medication Sig Start Date End Date Taking? Authorizing Provider  b complex vitamins tablet Take 1 tablet by mouth daily.   Yes Historical Provider, MD  cholecalciferol (VITAMIN D) 1000 units tablet Take 2,000 Units by mouth daily.   Yes Historical Provider, MD  Cranberry 500 MG CAPS Take 1,000 mg by mouth daily.   Yes Historical Provider, MD  furosemide (LASIX) 40 MG tablet Take 40 mg by mouth daily.   Yes Historical Provider, MD  glipiZIDE (GLUCOTROL) 10 MG tablet Take 20-30 mg by mouth 2 (two) times daily. Pt takes three tablets in the morning and two tablets at bedtime.   Yes Historical Provider, MD  Cathrine Muster 550 MG CAPS Take 550 mg by mouth daily.   Yes Historical Provider, MD  hydrochlorothiazide (HYDRODIURIL) 25 MG tablet Take 25 mg by mouth daily.   Yes Historical Provider, MD  metFORMIN (GLUCOPHAGE) 500 MG tablet Take 1,000 mg by mouth 2 (two) times daily with a meal.  Yes Historical Provider, MD  metoprolol succinate (TOPROL-XL) 25 MG 24 hr tablet Take 25 mg by mouth at bedtime.    Yes Historical Provider, MD  multivitamin-lutein (OCUVITE-LUTEIN) CAPS capsule Take 1 capsule by mouth 2 (two) times daily.   Yes Historical Provider, MD  Olive Leaf Extract 250 MG CAPS Take 250 mg by mouth 2 (two) times daily.   Yes Historical Provider, MD  OVER THE COUNTER MEDICATION Take 3 capsules by mouth 2 (two) times daily. Pt takes beet root.   Yes Historical Provider, MD  OVER THE COUNTER MEDICATION Take 1 capsule by mouth daily. Pt takes caprylic acid.   Yes Historical Provider, MD  spironolactone (ALDACTONE)  25 MG tablet Take 25 mg by mouth at bedtime.    Yes Historical Provider, MD  TURMERIC CURCUMIN PO Take 1 capsule by mouth daily.   Yes Historical Provider, MD  vitamin C (ASCORBIC ACID) 500 MG tablet Take 1,000 mg by mouth daily.   Yes Historical Provider, MD  vitamin E 400 UNIT capsule Take 400 Units by mouth daily.   Yes Historical Provider, MD      VITAL SIGNS:  Blood pressure 134/88, pulse 107, temperature 97.6 F (36.4 C), temperature source Oral, resp. rate 20, height 6' (1.829 m), weight 142.883 kg (315 lb), SpO2 97 %.  PHYSICAL EXAMINATION:  GENERAL:  65 y.o.-year-old patient lying in the bed with no acute distress.  morbid obese. EYES: Pupils equal, round, reactive to light and accommodation. No scleral icterus. Extraocular muscles intact.  HEENT: Head atraumatic, normocephalic. Oropharynx and nasopharynx clear.  Moist oral mucosa. NECK:  Supple, no jugular venous distention. No thyroid enlargement, no tenderness.  LUNGS: Normal breath sounds bilaterally, no wheezing, rales,rhonchi or crepitation. No use of accessory muscles of respiration.  CARDIOVASCULAR: S1, S2 normal. No murmurs, rubs, or gallops.  ABDOMEN: Soft, nontender, nondistended. Bowel sounds present. No organomegaly or mass.  EXTREMITIE: bilateral leg edema 2-3+, no cyanosis, or clubbing.  NEUROLOGIC: Cranial nerves II through XII are intact. Muscle strength 5/5 in all extremities. Sensation intact. Gait not checked.  PSYCHIATRIC: The patient is alert and oriented x 3.  SKIN: No obvious rash, lesion, or ulcer.   LABORATORY PANEL:   CBC  Recent Labs Lab 07/08/15 2011  WBC 6.3  HGB 13.4  HCT 41.2  PLT 178   ------------------------------------------------------------------------------------------------------------------  Chemistries   Recent Labs Lab 07/08/15 2011  NA 139  K 4.7  CL 98*  CO2 30  GLUCOSE 318*  BUN 33*  CREATININE 1.48*  CALCIUM 9.7    ------------------------------------------------------------------------------------------------------------------  Cardiac Enzymes  Recent Labs Lab 07/08/15 2011  TROPONINI 0.11*   ------------------------------------------------------------------------------------------------------------------  RADIOLOGY:  Dg Chest 2 View  07/08/2015  CLINICAL DATA:  Patient with a history of chf and cardiomyopathy. Patient states that since December he has developed shortness of breath, bilateral leg swelling and vertigo. Patient states that over the last week the symptoms have become worse. EXAM: CHEST  2 VIEW COMPARISON:  None. FINDINGS: Normal cardiac silhouette. There is central venous congestion. No effusion, infiltrate pneumothorax. No pleural fluid. No acute osseous abnormality. IMPRESSION: Central venous congestion.  No pleural floor no overt edema. Electronically Signed   By: Suzy Bouchard M.D.   On: 07/08/2015 20:43    EKG:   Orders placed or performed during the hospital encounter of 07/08/15  . EKG 12-Lead  . EKG 12-Lead  . ED EKG within 10 minutes  . ED EKG within 10 minutes    IMPRESSION AND PLAN:  Acute on chronic CHF, Cardiomyopathy The patient will be admitted to telemetry floor. I will start CHF protocol, start Lasix 40 mg IV twice a day, continue spironolactone, add lisinopril, give potassium supplement, get echocardiogram and a cardiology consult.  Eleviated troponin and CAD. Possible due to demanding ischemia. Follow-up troponin and lipid panel , start aspirin and Lipitor, follow-up cardiology consult.  Hypertension.  continue Lopressor, Lasix, spironolactone and lisinopril.  Diabetes, start sliding scale, hold metformin and glipizide.  CKD stage III. follow-up BMP while on Lasix.  OSA and Morbid obesity   All the records are reviewed and case discussed with ED provider. Management plans discussed with the patient, family and they are in agreement.  CODE  STATUS: Full code  TOTAL TIME TAKING CARE OF THIS PATIENT: 57 minutes.    Demetrios Loll M.D on 07/08/2015 at 9:47 PM  Between 7am to 6pm - Pager - [phone]  After 6pm go to [email or web address] - password [email or web address] [facility]  [facility]  Office  [phone]  CC: Primary care physician; No PCP Per Patient

## 2015-07-08 NOTE — ED Notes (Signed)
Patient with a history of chf and cardiomyopathy. Patient states that since December he has developed shortness of breath, bilateral leg swelling and vertigo. Patient states that over the last week the symptoms have become worse.

## 2015-07-08 NOTE — ED Notes (Signed)
Notified Dr Kerman Passey of elevated troponin.

## 2015-07-09 ENCOUNTER — Inpatient Hospital Stay (HOSPITAL_COMMUNITY)
Admit: 2015-07-09 | Discharge: 2015-07-09 | Disposition: A | Discharge status: Home / Self Care | Payer: Medicaid Other | Attending: Internal Medicine | Admitting: Internal Medicine

## 2015-07-09 DIAGNOSIS — R0600 Dyspnea, unspecified: Secondary | ICD-10-CM

## 2015-07-09 DIAGNOSIS — I5023 Acute on chronic systolic (congestive) heart failure: Secondary | ICD-10-CM

## 2015-07-09 LAB — BASIC METABOLIC PANEL
Anion gap: 11 (ref 5–15)
BUN: 31 mg/dL — AB (ref 6–20)
CALCIUM: 9.5 mg/dL (ref 8.9–10.3)
CHLORIDE: 99 mmol/L — AB (ref 101–111)
CO2: 29 mmol/L (ref 22–32)
Creatinine, Ser: 1.38 mg/dL — ABNORMAL HIGH (ref 0.61–1.24)
GFR calc Af Amer: >60 mL/min (ref >60)
GFR, EST NON AFRICAN AMERICAN: 53 mL/min — AB (ref >60)
Glucose, Bld: 214 mg/dL — ABNORMAL HIGH (ref 65–99)
POTASSIUM: 4.1 mmol/L (ref 3.5–5.1)
SODIUM: 139 mmol/L (ref 135–145)

## 2015-07-09 LAB — LIPID PANEL
CHOL/HDL RATIO: 4.5 ratio
CHOLESTEROL: 153 mg/dL (ref 0–200)
HDL: 34 mg/dL — ABNORMAL LOW (ref >40)
LDL Cholesterol: 85 mg/dL (ref 0–99)
TRIGLYCERIDES: 168 mg/dL — AB (ref <150)
VLDL: 34 mg/dL (ref 0–40)

## 2015-07-09 LAB — GLUCOSE, CAPILLARY
GLUCOSE-CAPILLARY: 227 mg/dL — AB (ref 65–99)
Glucose-Capillary: 181 mg/dL — ABNORMAL HIGH (ref 65–99)
Glucose-Capillary: 347 mg/dL — ABNORMAL HIGH (ref 65–99)
Glucose-Capillary: 209 mg/dL — ABNORMAL HIGH (ref 65–99)

## 2015-07-09 LAB — TROPONIN I
TROPONIN I: 0.12 ng/mL — AB (ref <0.031)
Troponin I: 0.12 ng/mL — ABNORMAL HIGH (ref <0.031)
Troponin I: 0.14 ng/mL — ABNORMAL HIGH (ref <0.031)

## 2015-07-09 LAB — CBC
HEMATOCRIT: 41.5 % (ref 40.0–52.0)
HEMOGLOBIN: 13.7 g/dL (ref 13.0–18.0)
MCH: 31.9 pg (ref 26.0–34.0)
MCHC: 33 g/dL (ref 32.0–36.0)
MCV: 97 fL (ref 80.0–100.0)
Platelets: 160 10*3/uL (ref 150–440)
RBC: 4.28 MIL/uL — ABNORMAL LOW (ref 4.40–5.90)
RDW: 15.5 % — AB (ref 11.5–14.5)
WBC: 6.8 10*3/uL (ref 3.8–10.6)

## 2015-07-09 LAB — HEMOGLOBIN A1C: HEMOGLOBIN A1C: 11 % — AB (ref 4.0–6.0)

## 2015-07-09 LAB — BRAIN NATRIURETIC PEPTIDE: B NATRIURETIC PEPTIDE 5: 1891 pg/mL — AB (ref 0.0–100.0)

## 2015-07-09 LAB — MAGNESIUM: Magnesium: 2.3 mg/dL (ref 1.7–2.4)

## 2015-07-09 MED ORDER — INSULIN GLARGINE 100 UNIT/ML ~~LOC~~ SOLN
8.0000 [IU] | Freq: Every day | SUBCUTANEOUS | Status: DC
  Administered 2015-07-09: 8 [IU] via SUBCUTANEOUS
  Filled 2015-07-09 (×2): qty 0.08

## 2015-07-09 MED ORDER — DEXTROSE 5 % IV SOLN
1.0000 g | INTRAVENOUS | Status: DC
  Administered 2015-07-09 – 2015-07-10 (×2): 1 g via INTRAVENOUS
  Filled 2015-07-09 (×3): qty 10

## 2015-07-09 MED ORDER — METOPROLOL SUCCINATE ER 25 MG PO TB24
25.0000 mg | ORAL_TABLET | Freq: Two times a day (BID) | ORAL | Status: DC
Start: 2015-07-09 — End: 2015-07-10
  Administered 2015-07-09 – 2015-07-10 (×2): 25 mg via ORAL
  Filled 2015-07-09: qty 1

## 2015-07-09 MED ORDER — METOPROLOL TARTRATE 25 MG PO TABS
25.0000 mg | ORAL_TABLET | Freq: Two times a day (BID) | ORAL | Status: DC
Start: 2015-07-09 — End: 2015-07-09
  Filled 2015-07-09: qty 1

## 2015-07-09 MED ORDER — FUROSEMIDE 10 MG/ML IJ SOLN
100.0000 mg | Freq: Two times a day (BID) | INTRAMUSCULAR | Status: DC
  Administered 2015-07-09 – 2015-07-10 (×3): 100 mg via INTRAVENOUS
  Filled 2015-07-09 (×5): qty 10

## 2015-07-09 MED ORDER — GUAIFENESIN-DM 100-10 MG/5ML PO SYRP
5.0000 mL | ORAL_SOLUTION | ORAL | Status: DC | PRN
  Administered 2015-07-09 – 2015-07-11 (×4): 5 mL via ORAL
  Filled 2015-07-09 (×4): qty 5

## 2015-07-09 NOTE — Progress Notes (Signed)
*  PRELIMINARY RESULTS* Echocardiogram 2D Echocardiogram has been performed.  [name] 07/09/2015, 11:43 AM

## 2015-07-09 NOTE — Progress Notes (Signed)
Patient is admitted to room 245 with a diagnosis of HF. Alert and oriented x 4. Denied any pain, no acute respiratory distress noted. Patient is oriented to her room, staff, call bell/ascom. Skin assessment done with Mya. Noted skin discoralation on bilateral legs, the legs are dry, flaky and reddened. Also noted dry patches scattered over skin (per patient is Psoriasis). Verified tele box with Jessica NT. Will continue to monitor.

## 2015-07-09 NOTE — Consult Note (Signed)
Cardiology Consultation Note  Patient ID: [name], MRN: [id number], DOB/AGE: Oct 16, 1950 65 y.o. Admit date: 07/08/2015   Date of Consult: 07/09/2015 Primary Physician: No PCP Per Patient Primary Cardiologist: New to [facility]  Chief Complaint: SOB and lower extremity swelling Reason for Consult: Acute on chronic systolic CHF  HPI: 65 y.o. male with h/o CAD s/p remote PCI and stenting approximately 25 years ago, ischemic cardiomyopathy with patient reported EF of 25% by cardiac cath in 2013 with medical management at that time per patient, obesity, OSA, HTN, and DM who presents to [facility] on 2/3 with increased SOB and bilateral lower extremity edema.   He recently moved from [place] to [place] in June of 2016 given hardships. He has not seen a cardiologist since this move. He was previously on Lasix 40 mg prn lower extremity swelling. As of late he has began to notice more frequent increased bilateral lower extremity swelling along with worsening SOB requiring him to sleep in a recliner. He has gone from taking Lasix 40 mg prn to 40 mg daily intermittently to taking Lasix 60 mg daily a couple of days. He reports not seeing much increase in his urine output with this increase in PO Lasix. He does not watch his PO salt or fluid intake.   Upon the patient's arrival to [facility] they were found to have a troponin 0.11-->0.12-->0.12, SCr 1.48-->1.38, BUN 33-->31. ECG showed sinus tachycardia, 110 bpm, PACs, rare PVC, left anterior fascicular block, cannot rule out inferior infarct, CXR showed central venous congestion, no pleural edema. He was started IV Lasix q 12 hours with 600 mL urine output for the admission so far.    Past Medical History  Diagnosis Date  . CHF (congestive heart failure) ([place])   . Sleep apnea   . Diabetes ([place])   . Renal disorder     kidney stone  . Hypertension       Most Recent Cardiac Studies: Echo pending   Surgical History:  Past Surgical History  Procedure Laterality  Date  . Coronary angioplasty with stent placement    . Kidney surgery       Home Meds: Prior to Admission medications   Medication Sig Start Date End Date Taking? Authorizing Provider  b complex vitamins tablet Take 1 tablet by mouth daily.   Yes Historical Provider, MD  cholecalciferol (VITAMIN D) 1000 units tablet Take 2,000 Units by mouth daily.   Yes Historical Provider, MD  Cranberry 500 MG CAPS Take 1,000 mg by mouth daily.   Yes Historical Provider, MD  furosemide (LASIX) 40 MG tablet Take 40 mg by mouth daily.   Yes Historical Provider, MD  glipiZIDE (GLUCOTROL) 10 MG tablet Take 20-30 mg by mouth 2 (two) times daily. Pt takes three tablets in the morning and two tablets at bedtime.   Yes Historical Provider, MD  Cathrine Muster 550 MG CAPS Take 550 mg by mouth daily.   Yes Historical Provider, MD  hydrochlorothiazide (HYDRODIURIL) 25 MG tablet Take 25 mg by mouth daily.   Yes Historical Provider, MD  metFORMIN (GLUCOPHAGE) 500 MG tablet Take 1,000 mg by mouth 2 (two) times daily with a meal.   Yes Historical Provider, MD  metoprolol succinate (TOPROL-XL) 25 MG 24 hr tablet Take 25 mg by mouth at bedtime.    Yes Historical Provider, MD  multivitamin-lutein (OCUVITE-LUTEIN) CAPS capsule Take 1 capsule by mouth 2 (two) times daily.   Yes Historical Provider, MD  Olive Leaf Extract 250 MG CAPS Take  250 mg by mouth 2 (two) times daily.   Yes Historical Provider, MD  OVER THE COUNTER MEDICATION Take 3 capsules by mouth 2 (two) times daily. Pt takes beet root.   Yes Historical Provider, MD  OVER THE COUNTER MEDICATION Take 1 capsule by mouth daily. Pt takes caprylic acid.   Yes Historical Provider, MD  spironolactone (ALDACTONE) 25 MG tablet Take 25 mg by mouth at bedtime.    Yes Historical Provider, MD  TURMERIC CURCUMIN PO Take 1 capsule by mouth daily.   Yes Historical Provider, MD  vitamin C (ASCORBIC ACID) 500 MG tablet Take 1,000 mg by mouth daily.   Yes Historical Provider, MD    vitamin E 400 UNIT capsule Take 400 Units by mouth daily.   Yes Historical Provider, MD    Inpatient Medications:  . aspirin  81 mg Oral Daily  . atorvastatin  40 mg Oral q1800  . furosemide  40 mg Intravenous Q12H  . heparin  5,000 Units Subcutaneous 3 times per day  . insulin aspart  0-5 Units Subcutaneous QHS  . insulin aspart  0-9 Units Subcutaneous TID WC  . lisinopril  5 mg Oral Daily  . metoprolol succinate  25 mg Oral QHS  . potassium chloride  10 mEq Oral Daily  . sodium chloride flush  3 mL Intravenous Q12H  . spironolactone  25 mg Oral QHS      Allergies: No Known Allergies  Social History   Social History  . Marital Status: Single    Spouse Name: N/A  . Number of Children: N/A  . Years of Education: N/A   Occupational History  . Not on file.   Social History Main Topics  . Smoking status: Never Smoker   . Smokeless tobacco: Not on file  . Alcohol Use: Yes     Comment: occasionally  . Drug Use: No  . Sexual Activity: Not on file   Other Topics Concern  . Not on file   Social History Narrative  . No narrative on file     Family History  Problem Relation Age of Onset  . Heart failure Father   . Heart attack Brother      Review of Systems: Review of Systems  Constitutional: Positive for malaise/fatigue. Negative for fever, chills, weight loss and diaphoresis.  HENT: Negative for congestion.   Eyes: Negative for discharge and redness.  Respiratory: Positive for shortness of breath. Negative for cough, hemoptysis, sputum production and wheezing.   Cardiovascular: Positive for orthopnea and leg swelling. Negative for chest pain, palpitations, claudication and PND.  Gastrointestinal: Negative for heartburn, nausea, vomiting and abdominal pain.  Musculoskeletal: Negative for myalgias and falls.  Skin: Negative for rash.  Neurological: Positive for weakness. Negative for dizziness, tingling, tremors, sensory change, speech change, focal weakness and  loss of consciousness.  Endo/Heme/Allergies: Does not bruise/bleed easily.  Psychiatric/Behavioral: Negative for substance abuse. The patient is not nervous/anxious.   All other systems reviewed and are negative.   Labs:  Recent Labs  07/08/15 2011 07/08/15 2327 07/09/15 0455  TROPONINI 0.11* 0.12* 0.12*   Lab Results  Component Value Date   WBC 6.8 07/09/2015   HGB 13.7 07/09/2015   HCT 41.5 07/09/2015   MCV 97.0 07/09/2015   PLT 160 07/09/2015    Recent Labs Lab 07/09/15 0455  NA 139  K 4.1  CL 99*  CO2 29  BUN 31*  CREATININE 1.38*  CALCIUM 9.5  GLUCOSE 214*   Lab Results  Component  Value Date   CHOL 153 07/09/2015   HDL 34* 07/09/2015   LDLCALC 85 07/09/2015   TRIG 168* 07/09/2015   No results found for: DDIMER  Radiology/Studies:  Dg Chest 2 View  07/08/2015  CLINICAL DATA:  Patient with a history of chf and cardiomyopathy. Patient states that since December he has developed shortness of breath, bilateral leg swelling and vertigo. Patient states that over the last week the symptoms have become worse. EXAM: CHEST  2 VIEW COMPARISON:  None. FINDINGS: Normal cardiac silhouette. There is central venous congestion. No effusion, infiltrate pneumothorax. No pleural fluid. No acute osseous abnormality. IMPRESSION: Central venous congestion.  No pleural floor no overt edema. Electronically Signed   By: Suzy Bouchard M.D.   On: 07/08/2015 20:43    EKG: sinus tachycardia, 110 bpm, PACs, rare PVC, left anterior fascicular block, cannot rule out inferior infarct  Weights: Filed Weights   07/08/15 2002 07/08/15 2228 07/09/15 0516  Weight: 315 lb (142.883 kg) 302 lb 8 oz (137.213 kg) 302 lb 8 oz (137.213 kg)     Physical Exam: Blood pressure 137/95, pulse 113, temperature 97.8 F (36.6 C), temperature source Oral, resp. rate 18, height 6' (1.829 m), weight 302 lb 8 oz (137.213 kg), SpO2 97 %. Body mass index is 41.02 kg/(m^2). General: Well developed, well  nourished, in no acute distress. Head: Normocephalic, atraumatic, sclera non-icteric, no xanthomas, nares are without discharge.  Neck: Negative for carotid bruits. JVD not elevated. Lungs: Clear bilaterally to auscultation without wheezes, rales, or rhonchi. Breathing is unlabored. Heart: RRR with S1 S2. No murmurs, rubs, or gallops appreciated. Abdomen: Obese, soft, non-tender, non-distended with normoactive bowel sounds. No hepatomegaly. No rebound/guarding. No obvious abdominal masses. Msk:  Strength and tone appear normal for age. Extremities: No clubbing or cyanosis. 2+ pitting woody edema to the lower shin with 1+ pitting edema to the bilateral thighs.   Neuro: Alert and oriented X 3. No facial asymmetry. No focal deficit. Moves all extremities spontaneously. Psych:  Responds to questions appropriately with a normal affect.    Assessment and Plan:   1. Acute on likely chronic systolic CHF: -Minimal urine output with IV Lasix 40 mg q 12 hours -Still with significant lower extremity swelling -Echo is pending. I added a BNP -It is unclear what his baseline renal function is as he presented with a renal function of 1.48 and had been taking extra Lasix up to 60 mg daily -With IV Lasix 40 mg q 12 hours his SCr has improved some, though minimal UOP as above -Perhaps he would either benefit from either an increase in his IV lasix or a change to IV Bumex  -Consider changing to torsemide at discharge as he reports minimal UOP with PO Lasix at home -Toprol XL 25 mg, lisinopril 5 mg, spironolactone 25 mg  2. CAD s/p patient reported PCI as above: -No anginal symptoms -Minimally elevated troponin likely in the setting of volume overload -Check echo as above -Have patient sign record release to obtain records from [place] cardiologist  -Toprol and lisinopril as above  3. Acute renal injury: -Unknown baseline -Improving -Monitor closely with diuresis -If trends upwards would hold  lisinopril and spironolactone   4. HTN: -Continue current medications   5. Obesity/OSA: -Weight loss advised  6. HLD: -Lipitor 40 mg   SignedChristell Faith, PA-C Pager: [phone] 07/09/2015, 9:39 AM  I have seen and examined this patient with Christell Faith.  Agree with above, note added to reflect my  findings.  On exam, tachycardic, regular, no murmurs, lungs clear, 2LE edema to the knee.  Patient has had shortness of breath for about a month now and is unable to walk across the room. [name] also been sleeping in a chair because he cannot lay flat. He has significant lower extremity edema and obvious heart failure symptoms. He does say that he had a catheterization and a stent placed 25 years ago and that his EF is at 25% as well. He has an echo ordered and we should see what his EF is at this time. He has been getting 40 mg of IV Lasix since admission but is not positive. We'll increase his Lasix today to see if he will urinate more. [name] getting a BNP drawn right now which will give [place] an idea of his fluid status. Agree with Aldactone, lisinopril, and metoprolol, and these may be able to be increased once his volume status is improved.  Will M. Camnitz MD 07/09/2015 10:53 AM

## 2015-07-09 NOTE — Progress Notes (Signed)
Patient ID: [name], male   DOB: 1951/05/05, 65 y.o.   MRN: [id number] [facility] PROGRESS NOTE  [name] [id number] DOB: July 11, 1950 DOA: 07/08/2015 PCP: No PCP Per Patient  HPI/Subjective: Patient with shortness of breath with any activity. Increased leg swelling recently increased weight gain. He feels okay at rest.  Objective: Filed Vitals:   07/09/15 0800 07/09/15 1156  BP: 137/95 126/89  Pulse: 113 109  Temp: 97.8 F (36.6 C)   Resp: 18 20    Filed Weights   07/08/15 2002 07/08/15 2228 07/09/15 0516  Weight: 142.883 kg (315 lb) 137.213 kg (302 lb 8 oz) 137.213 kg (302 lb 8 oz)    ROS: Review of Systems  Constitutional: Negative for fever and chills.  Eyes: Negative for blurred vision.  Respiratory: Positive for shortness of breath. Negative for cough.   Cardiovascular: Negative for chest pain.  Gastrointestinal: Negative for nausea, vomiting, abdominal pain, diarrhea and constipation.  Genitourinary: Negative for dysuria.  Musculoskeletal: Negative for joint pain.  Neurological: Negative for dizziness and headaches.   Exam: Physical Exam  Constitutional: [name] oriented to person, place, and time.  HENT:  Nose: No mucosal edema.  Mouth/Throat: No oropharyngeal exudate or posterior oropharyngeal edema.  Eyes: Conjunctivae, EOM and lids are normal. Pupils are equal, round, and reactive to light.  Neck: No JVD present. Carotid bruit is not present. No edema present. No thyroid mass and no thyromegaly present.  Cardiovascular: S1 normal and S2 normal.  An irregularly irregular rhythm present. Exam reveals no gallop.   Murmur heard.  Systolic murmur is present with a grade of 2/6  Pulses:      Dorsalis pedis pulses are 2+ on the right side, and 2+ on the left side.  Respiratory: No respiratory distress. He has decreased breath sounds in the right lower field and the left lower field. He has no wheezes. He has no rhonchi. He has no rales.  GI: Soft.  Bowel sounds are normal. There is no tenderness.  Musculoskeletal:       Right ankle: He exhibits swelling.       Left ankle: He exhibits swelling.  Lymphadenopathy:    He has no cervical adenopathy.  Neurological: [name] alert and oriented to person, place, and time. No cranial nerve deficit.  Skin: Skin is warm. Nails show no clubbing.  Erythema and warmth bilateral lower extremities and feet.  Psychiatric: He has a normal mood and affect.      Data Reviewed: Basic Metabolic Panel:  Recent Labs Lab 07/08/15 2011 07/09/15 0455  NA 139 139  K 4.7 4.1  CL 98* 99*  CO2 30 29  GLUCOSE 318* 214*  BUN 33* 31*  CREATININE 1.48* 1.38*  CALCIUM 9.7 9.5  MG  --  2.3   CBC:  Recent Labs Lab 07/08/15 2011 07/09/15 0455  WBC 6.3 6.8  HGB 13.4 13.7  HCT 41.2 41.5  MCV 99.4 97.0  PLT 178 160   Cardiac Enzymes:  Recent Labs Lab 07/08/15 2011 07/08/15 2327 07/09/15 0455 07/09/15 1055  TROPONINI 0.11* 0.12* 0.12* 0.14*   BNP (last 3 results)  Recent Labs  07/09/15 1048  BNP 1891.0*    CBG:  Recent Labs Lab 07/08/15 2231 07/09/15 0756 07/09/15 1153  GLUCAP 251* 181* 347*      Studies: Dg Chest 2 View  07/08/2015  CLINICAL DATA:  Patient with a history of chf and cardiomyopathy. Patient states that since December he has developed shortness of breath,  bilateral leg swelling and vertigo. Patient states that over the last week the symptoms have become worse. EXAM: CHEST  2 VIEW COMPARISON:  None. FINDINGS: Normal cardiac silhouette. There is central venous congestion. No effusion, infiltrate pneumothorax. No pleural fluid. No acute osseous abnormality. IMPRESSION: Central venous congestion.  No pleural floor no overt edema. Electronically Signed   By: Suzy Bouchard M.D.   On: 07/08/2015 20:43    Scheduled Meds: . aspirin  81 mg Oral Daily  . atorvastatin  40 mg Oral q1800  . cefTRIAXone (ROCEPHIN)  IV  1 g Intravenous Q24H  . furosemide  100 mg Intravenous  BID  . heparin  5,000 Units Subcutaneous 3 times per day  . insulin aspart  0-5 Units Subcutaneous QHS  . insulin aspart  0-9 Units Subcutaneous TID WC  . lisinopril  5 mg Oral Daily  . metoprolol succinate  25 mg Oral QHS  . potassium chloride  10 mEq Oral Daily  . sodium chloride flush  3 mL Intravenous Q12H  . spironolactone  25 mg Oral QHS    Assessment/Plan:  1. 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 Cardiology increased Lasix 200 mg twice a day. Patient also on spironolactone, metoprolol and lisinopril. 2. Atrial fibrillation new onset. Increase metoprolol to twice a day dosing. I will let cardiology decide on anticoagulation, depending on whether they want to do a procedure not first 3. Bilateral Page Spiro and be cellulitis start Rocephin 4. Type 2 diabetes mellitus- on sliding scale and start low-dose Lantus at night. Oral meds on hold at this point. 5. Obesity and sleep apnea 6. Chronic kidney disease stage II  Code Status:     Code Status Orders        Start     Ordered   07/08/15 2228  Full code   Continuous     07/08/15 2228    Code Status History    Date Active Date Inactive Code Status Order ID Comments User Context   This patient has a current code status but no historical code status.     Disposition Plan: Home once diuresis.  Consultants:  Cardiology  Antibiotics:  Rocephin  Time spent: 25 minutes  Loletha Grayer  [facility]

## 2015-07-10 DIAGNOSIS — I509 Heart failure, unspecified: Secondary | ICD-10-CM

## 2015-07-10 LAB — GLUCOSE, CAPILLARY
GLUCOSE-CAPILLARY: 163 mg/dL — AB (ref 65–99)
GLUCOSE-CAPILLARY: 263 mg/dL — AB (ref 65–99)
Glucose-Capillary: 193 mg/dL — ABNORMAL HIGH (ref 65–99)
Glucose-Capillary: 255 mg/dL — ABNORMAL HIGH (ref 65–99)

## 2015-07-10 LAB — BASIC METABOLIC PANEL
Anion gap: 14 (ref 5–15)
BUN: 31 mg/dL — AB (ref 6–20)
CHLORIDE: 96 mmol/L — AB (ref 101–111)
CO2: 28 mmol/L (ref 22–32)
Calcium: 9.3 mg/dL (ref 8.9–10.3)
Creatinine, Ser: 1.44 mg/dL — ABNORMAL HIGH (ref 0.61–1.24)
GFR calc Af Amer: 58 mL/min — ABNORMAL LOW (ref >60)
GFR calc non Af Amer: 50 mL/min — ABNORMAL LOW (ref >60)
Glucose, Bld: 190 mg/dL — ABNORMAL HIGH (ref 65–99)
POTASSIUM: 3.3 mmol/L — AB (ref 3.5–5.1)
SODIUM: 138 mmol/L (ref 135–145)

## 2015-07-10 MED ORDER — METOPROLOL SUCCINATE ER 50 MG PO TB24
50.0000 mg | ORAL_TABLET | Freq: Two times a day (BID) | ORAL | Status: DC
  Administered 2015-07-10 – 2015-07-12 (×4): 50 mg via ORAL
  Filled 2015-07-10 (×4): qty 1

## 2015-07-10 MED ORDER — INSULIN GLARGINE 100 UNIT/ML ~~LOC~~ SOLN
14.0000 [IU] | Freq: Every day | SUBCUTANEOUS | Status: DC
  Administered 2015-07-10 – 2015-07-12 (×2): 14 [IU] via SUBCUTANEOUS
  Filled 2015-07-10 (×3): qty 0.14

## 2015-07-10 MED ORDER — APIXABAN 5 MG PO TABS
5.0000 mg | ORAL_TABLET | Freq: Two times a day (BID) | ORAL | Status: AC
Start: 2015-07-10 — End: 2015-07-22
  Administered 2015-07-10 – 2015-07-22 (×26): 5 mg via ORAL
  Filled 2015-07-10 (×26): qty 1

## 2015-07-10 NOTE — Progress Notes (Signed)
Patient ID: [name], male   DOB: 09-11-1950, 65 y.o.   MRN: [id number] [facility] PROGRESS NOTE  [name] [id number] DOB: 1950/08/07 DOA: 07/08/2015 PCP: No PCP Per Patient  HPI/Subjective: Patient feeling a little bit better today. Still short of breath with limited activity. Still with swelling of the lower extremity. Urinating better.  Objective: Filed Vitals:   07/10/15 0817 07/10/15 1118  BP: 128/87 113/71  Pulse: 120 101  Temp: 98 F (36.7 C) 97.5 F (36.4 C)  Resp: 20 20    Filed Weights   07/08/15 2228 07/09/15 0516 07/10/15 0408  Weight: 137.213 kg (302 lb 8 oz) 137.213 kg (302 lb 8 oz) 136.805 kg (301 lb 9.6 oz)    ROS: Review of Systems  Constitutional: Negative for fever and chills.  Eyes: Negative for blurred vision.  Respiratory: Positive for shortness of breath. Negative for cough.   Cardiovascular: Negative for chest pain.  Gastrointestinal: Negative for nausea, vomiting, abdominal pain, diarrhea and constipation.  Genitourinary: Negative for dysuria.  Musculoskeletal: Negative for joint pain.  Neurological: Negative for dizziness and headaches.   Exam: Physical Exam  Constitutional: [name] oriented to person, place, and time.  HENT:  Nose: No mucosal edema.  Mouth/Throat: No oropharyngeal exudate or posterior oropharyngeal edema.  Eyes: Conjunctivae, EOM and lids are normal. Pupils are equal, round, and reactive to light.  Neck: No JVD present. Carotid bruit is not present. No edema present. No thyroid mass and no thyromegaly present.  Cardiovascular: S1 normal and S2 normal.  An irregularly irregular rhythm present. Exam reveals no gallop.   Murmur heard.  Systolic murmur is present with a grade of 2/6  Pulses:      Dorsalis pedis pulses are 2+ on the right side, and 2+ on the left side.  Respiratory: No respiratory distress. He has decreased breath sounds in the right lower field and the left lower field. He has no wheezes. He  has no rhonchi. He has no rales.  GI: Soft. Bowel sounds are normal. There is no tenderness.  Musculoskeletal:       Right ankle: He exhibits swelling.       Left ankle: He exhibits swelling.  Lymphadenopathy:    He has no cervical adenopathy.  Neurological: [name] alert and oriented to person, place, and time. No cranial nerve deficit.  Skin: Skin is warm. Nails show no clubbing.  Erythema and warmth bilateral lower extremities and feet.  Psychiatric: He has a normal mood and affect.      Data Reviewed: Basic Metabolic Panel:  Recent Labs Lab 07/08/15 2011 07/09/15 0455 07/10/15 0554  NA 139 139 138  K 4.7 4.1 3.3*  CL 98* 99* 96*  CO2 30 29 28   GLUCOSE 318* 214* 190*  BUN 33* 31* 31*  CREATININE 1.48* 1.38* 1.44*  CALCIUM 9.7 9.5 9.3  MG  --  2.3  --    CBC:  Recent Labs Lab 07/08/15 2011 07/09/15 0455  WBC 6.3 6.8  HGB 13.4 13.7  HCT 41.2 41.5  MCV 99.4 97.0  PLT 178 160   Cardiac Enzymes:  Recent Labs Lab 07/08/15 2011 07/08/15 2327 07/09/15 0455 07/09/15 1055  TROPONINI 0.11* 0.12* 0.12* 0.14*   BNP (last 3 results)  Recent Labs  07/09/15 1048  BNP 1891.0*    CBG:  Recent Labs Lab 07/09/15 1153 07/09/15 1637 07/09/15 2107 07/10/15 0719 07/10/15 1116  GLUCAP 347* 227* 209* 163* 263*      Studies: Dg Chest  2 View  07/08/2015  CLINICAL DATA:  Patient with a history of chf and cardiomyopathy. Patient states that since December he has developed shortness of breath, bilateral leg swelling and vertigo. Patient states that over the last week the symptoms have become worse. EXAM: CHEST  2 VIEW COMPARISON:  None. FINDINGS: Normal cardiac silhouette. There is central venous congestion. No effusion, infiltrate pneumothorax. No pleural fluid. No acute osseous abnormality. IMPRESSION: Central venous congestion.  No pleural floor no overt edema. Electronically Signed   By: Suzy Bouchard M.D.   On: 07/08/2015 20:43    Scheduled Meds: . apixaban   5 mg Oral BID  . aspirin  81 mg Oral Daily  . atorvastatin  40 mg Oral q1800  . cefTRIAXone (ROCEPHIN)  IV  1 g Intravenous Q24H  . furosemide  100 mg Intravenous BID  . insulin aspart  0-5 Units Subcutaneous QHS  . insulin aspart  0-9 Units Subcutaneous TID WC  . insulin glargine  8 Units Subcutaneous QHS  . lisinopril  5 mg Oral Daily  . metoprolol succinate  50 mg Oral BID  . potassium chloride  10 mEq Oral Daily  . sodium chloride flush  3 mL Intravenous Q12H  . spironolactone  25 mg Oral QHS    Assessment/Plan:  1. 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 Cardiology increased Lasix 100 mg iv twice a day. Patient also on spironolactone, metoprolol and lisinopril. 2. Atrial fibrillation new onset. Today looks more like sinus arrhythmia. Increase Toprol to 50 mg twice a day. Start eliquis 5 mg twice a day 3. Bilateral lower extremity cellulitis -continue Rocephin 4. Type 2 diabetes mellitus- on sliding scale and low-dose Lantus at night. Oral meds on hold at this point. 5. Obesity and sleep apnea 6. Chronic kidney disease stage 3. Watch with diuresis.  Code Status:     Code Status Orders        Start     Ordered   07/08/15 2228  Full code   Continuous     07/08/15 2228    Code Status History    Date Active Date Inactive Code Status Order ID Comments User Context   This patient has a current code status but no historical code status.     Disposition Plan:   Consultants:  Cardiology  Antibiotics:  Rocephin  Time spent: 25 minutes  Loletha Grayer  [facility]

## 2015-07-10 NOTE — Progress Notes (Addendum)
ANTICOAGULATION CONSULT NOTE - Initial Consult  Pharmacy Consult for Apixaban Indication: atrial fibrillation  No Known Allergies  Patient Measurements: Height: 6' (182.9 cm) Weight: (!) 301 lb 9.6 oz (136.805 kg) IBW/kg (Calculated) : 77.6   Vital Signs: Temp: 98 F (36.7 C) (02/05 0817) Temp Source: Oral (02/05 0817) BP: 128/87 mmHg (02/05 0817) Pulse Rate: 120 (02/05 0817)  Labs:  Recent Labs  07/08/15 2011 07/08/15 2327 07/09/15 0455 07/09/15 1055 07/10/15 0554  HGB 13.4  --  13.7  --   --   HCT 41.2  --  41.5  --   --   PLT 178  --  160  --   --   CREATININE 1.48*  --  1.38*  --  1.44*  TROPONINI 0.11* 0.12* 0.12* 0.14*  --     Estimated Creatinine Clearance: 75.2 mL/min (by C-G formula based on Cr of 1.44).   Assessment: 65 yo male found to have new onset AFib starting on Eliquis.   2/4 Hgb 13.7, Plt 160  Goal of Therapy:  Monitor platelets by anticoagulation protocol: Yes   Plan:  D/c SQ heparin Will start apixaban 5 mg PO BID (age <80, SCr <1.5, Wt>60kg) Will need CBC q3 days  Rayna Sexton L 07/10/2015,10:52 AM

## 2015-07-10 NOTE — Progress Notes (Signed)
Patient rested quietly tonight with a cough as his only complaint. PRN Robitussin ordered and relieved patient's cough. A&Ox4 and VSS. Nursing staff will continue to monitor. Earleen Reaper, RN

## 2015-07-11 DIAGNOSIS — I4891 Unspecified atrial fibrillation: Secondary | ICD-10-CM

## 2015-07-11 LAB — BASIC METABOLIC PANEL
ANION GAP: 11 (ref 5–15)
BUN: 31 mg/dL — ABNORMAL HIGH (ref 6–20)
CALCIUM: 9.6 mg/dL (ref 8.9–10.3)
CO2: 33 mmol/L — ABNORMAL HIGH (ref 22–32)
Chloride: 94 mmol/L — ABNORMAL LOW (ref 101–111)
Creatinine, Ser: 1.76 mg/dL — ABNORMAL HIGH (ref 0.61–1.24)
GFR, EST AFRICAN AMERICAN: 46 mL/min — AB (ref >60)
GFR, EST NON AFRICAN AMERICAN: 39 mL/min — AB (ref >60)
Glucose, Bld: 149 mg/dL — ABNORMAL HIGH (ref 65–99)
Potassium: 4.2 mmol/L (ref 3.5–5.1)
Sodium: 138 mmol/L (ref 135–145)

## 2015-07-11 LAB — GLUCOSE, CAPILLARY
GLUCOSE-CAPILLARY: 291 mg/dL — AB (ref 65–99)
Glucose-Capillary: 132 mg/dL — ABNORMAL HIGH (ref 65–99)
Glucose-Capillary: 274 mg/dL — ABNORMAL HIGH (ref 65–99)
Glucose-Capillary: 246 mg/dL — ABNORMAL HIGH (ref 65–99)

## 2015-07-11 MED ORDER — ALPRAZOLAM 0.25 MG PO TABS
0.2500 mg | ORAL_TABLET | Freq: Three times a day (TID) | ORAL | Status: DC | PRN
  Administered 2015-07-11 – 2015-07-31 (×20): 0.25 mg via ORAL
  Filled 2015-07-11 (×20): qty 1

## 2015-07-11 MED ORDER — TORSEMIDE 20 MG PO TABS
40.0000 mg | ORAL_TABLET | Freq: Two times a day (BID) | ORAL | Status: DC
  Administered 2015-07-11 (×2): 40 mg via ORAL
  Filled 2015-07-11 (×2): qty 2

## 2015-07-11 MED ORDER — LABETALOL HCL 5 MG/ML IV SOLN
10.0000 mg | INTRAVENOUS | Status: DC | PRN
  Administered 2015-07-11: 10 mg via INTRAVENOUS
  Filled 2015-07-11: qty 4

## 2015-07-11 MED ORDER — AMIODARONE HCL IN DEXTROSE 360-4.14 MG/200ML-% IV SOLN
30.0000 mg/h | INTRAVENOUS | Status: DC
  Administered 2015-07-11 – 2015-07-12 (×3): 30 mg/h via INTRAVENOUS
  Filled 2015-07-11 (×6): qty 200

## 2015-07-11 MED ORDER — LORAZEPAM 2 MG/ML IJ SOLN
0.5000 mg | Freq: Once | INTRAMUSCULAR | Status: AC
  Administered 2015-07-12: 0.5 mg via INTRAVENOUS
  Filled 2015-07-11: qty 1

## 2015-07-11 MED ORDER — AMIODARONE LOAD VIA INFUSION
150.0000 mg | Freq: Once | INTRAVENOUS | Status: AC
  Administered 2015-07-11: 150 mg via INTRAVENOUS
  Filled 2015-07-11: qty 83.34

## 2015-07-11 MED ORDER — CEPHALEXIN 500 MG PO CAPS
500.0000 mg | ORAL_CAPSULE | Freq: Three times a day (TID) | ORAL | Status: DC
  Administered 2015-07-11 – 2015-07-17 (×18): 500 mg via ORAL
  Filled 2015-07-11 (×26): qty 1

## 2015-07-11 MED ORDER — AMIODARONE HCL IN DEXTROSE 360-4.14 MG/200ML-% IV SOLN
60.0000 mg/h | INTRAVENOUS | Status: DC
  Administered 2015-07-11 (×2): 60 mg/h via INTRAVENOUS
  Filled 2015-07-11: qty 200

## 2015-07-11 NOTE — Progress Notes (Signed)
Patient ID: [name], male   DOB: June 10, 1950, 65 y.o.   MRN: [id number] [facility] PROGRESS NOTE  [name] [id number] DOB: 25-Jan-1951 DOA: 07/08/2015 PCP: No PCP Per Patient  HPI/Subjective: Patient was short of breath last night and thinks he had a panic attack. He feels a little bit better after Xanax. Cardiology put on amiodarone drip. Still has some shortness of breath.  Objective: Filed Vitals:   07/11/15 1045 07/11/15 1123  BP: 115/77 102/80  Pulse: 100 100  Temp:  97.8 F (36.6 C)  Resp: 20 20    Filed Weights   07/09/15 0516 07/10/15 0408 07/11/15 0330  Weight: 137.213 kg (302 lb 8 oz) 136.805 kg (301 lb 9.6 oz) 135.978 kg (299 lb 12.4 oz)    ROS: Review of Systems  Constitutional: Negative for fever and chills.  Eyes: Negative for blurred vision.  Respiratory: Positive for shortness of breath. Negative for cough.   Cardiovascular: Negative for chest pain.  Gastrointestinal: Negative for nausea, vomiting, abdominal pain, diarrhea and constipation.  Genitourinary: Negative for dysuria.  Musculoskeletal: Negative for joint pain.  Neurological: Negative for dizziness and headaches.   Exam: Physical Exam  Constitutional: [name] oriented to person, place, and time.  HENT:  Nose: No mucosal edema.  Mouth/Throat: No oropharyngeal exudate or posterior oropharyngeal edema.  Eyes: Conjunctivae, EOM and lids are normal. Pupils are equal, round, and reactive to light.  Neck: No JVD present. Carotid bruit is not present. No edema present. No thyroid mass and no thyromegaly present.  Cardiovascular: S1 normal and S2 normal.  An irregularly irregular rhythm present. Exam reveals no gallop.   Murmur heard.  Systolic murmur is present with a grade of 2/6  Pulses:      Dorsalis pedis pulses are 2+ on the right side, and 2+ on the left side.  Respiratory: No respiratory distress. He has decreased breath sounds in the right lower field and the left lower field.  He has no wheezes. He has no rhonchi. He has no rales.  GI: Soft. Bowel sounds are normal. There is no tenderness.  Musculoskeletal:       Right ankle: He exhibits swelling.       Left ankle: He exhibits swelling.  Lymphadenopathy:    He has no cervical adenopathy.  Neurological: [name] alert and oriented to person, place, and time. No cranial nerve deficit.  Skin: Skin is warm. Nails show no clubbing.  Erythema bilateral lower extremities and feet. Bilateral lower extremities are normal or warm.  Psychiatric: He has a normal mood and affect.      Data Reviewed: Basic Metabolic Panel:  Recent Labs Lab 07/08/15 2011 07/09/15 0455 07/10/15 0554 07/11/15 0437  NA 139 139 138 138  K 4.7 4.1 3.3* 4.2  CL 98* 99* 96* 94*  CO2 30 29 28  33*  GLUCOSE 318* 214* 190* 149*  BUN 33* 31* 31* 31*  CREATININE 1.48* 1.38* 1.44* 1.76*  CALCIUM 9.7 9.5 9.3 9.6  MG  --  2.3  --   --    CBC:  Recent Labs Lab 07/08/15 2011 07/09/15 0455  WBC 6.3 6.8  HGB 13.4 13.7  HCT 41.2 41.5  MCV 99.4 97.0  PLT 178 160   Cardiac Enzymes:  Recent Labs Lab 07/08/15 2011 07/08/15 2327 07/09/15 0455 07/09/15 1055  TROPONINI 0.11* 0.12* 0.12* 0.14*   BNP (last 3 results)  Recent Labs  07/09/15 1048  BNP 1891.0*    CBG:  Recent Labs Lab  07/10/15 1116 07/10/15 1632 07/10/15 2130 07/11/15 0737 07/11/15 1125  GLUCAP 263* 193* 255* 132* 291*      Studies: No results found.  Scheduled Meds: . apixaban  5 mg Oral BID  . aspirin  81 mg Oral Daily  . atorvastatin  40 mg Oral q1800  . cephALEXin  500 mg Oral 3 times per day  . insulin aspart  0-5 Units Subcutaneous QHS  . insulin aspart  0-9 Units Subcutaneous TID WC  . insulin glargine  14 Units Subcutaneous QHS  . lisinopril  5 mg Oral Daily  . metoprolol succinate  50 mg Oral BID  . potassium chloride  10 mEq Oral Daily  . sodium chloride flush  3 mL Intravenous Q12H  . spironolactone  25 mg Oral QHS  . torsemide  40 mg  Oral BID    Assessment/Plan:  1. 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 Cardiology with the patient on torsemide. Patient also on spironolactone, metoprolol and lisinopril. 2. Atrial fibrillation new onset. Cardiology put on amiodarone drip. Increase Toprol to 50 mg twice a day. Eliquis 5 mg twice a day 3. Bilateral lower extremity cellulitis -switch Rocephin to Keflex 4. Type 2 diabetes mellitus- on sliding scale and low-dose Lantus at night. Oral meds on hold at this point. 5. Obesity and sleep apnea 6. Acute on Chronic kidney disease stage 3. Watch with diuresis. Check a BMP tomorrow morning  Code Status:     Code Status Orders        Start     Ordered   07/08/15 2228  Full code   Continuous     07/08/15 2228    Code Status History    Date Active Date Inactive Code Status Order ID Comments User Context   This patient has a current code status but no historical code status.     Disposition Plan:   Consultants:  Cardiology  Antibiotics:  Rocephin  Time spent: 25 minutes  Loletha Grayer  [facility]

## 2015-07-11 NOTE — Progress Notes (Signed)
Initial appointment made at the [facility] on July 25, 2015 at 12:30pm. Thank you.

## 2015-07-11 NOTE — Progress Notes (Signed)
Pt states "i feel really good, this is the best I have felt all weekend". Reporting that he is no longer short of breath, denies anymore chest tightness. Will continue to monitor.   [name] M

## 2015-07-11 NOTE — Progress Notes (Signed)
Pt reports having a chest tightness. Vital signs checked, BP 165/142. MD Jannifer Franklin notified. Order given for 10 mg Labetalol IV push. Awaiting medication from pharmacy. Will continue to monitor.     Iran Sizer M

## 2015-07-11 NOTE — Progress Notes (Signed)
Inpatient Diabetes Program Recommendations  AACE/ADA: New Consensus Statement on Inpatient Glycemic Control (2015)  Target Ranges:  Prepandial:   less than 140 mg/dL      Peak postprandial:   less than 180 mg/dL (1-2 hours)      Critically ill patients:  140 - 180 mg/dL   Review of Glycemic Control:  Results for [name], [name] (MRN [id number]) as of 07/11/2015 09:43  Ref. Range 07/10/2015 07:19 07/10/2015 11:16 07/10/2015 16:32 07/10/2015 21:30 07/11/2015 07:37  Glucose-Capillary Latest Ref Range: 65-99 mg/dL 163 (H) 263 (H) 193 (H) 255 (H) 132 (H)   Results for [name], [name] (MRN [id number]) as of 07/11/2015 09:43  Ref. Range 07/08/2015 20:11  Hemoglobin A1C Latest Ref Range: 4.0-6.0 % 11.0 (H)   Diabetes history:  Diabetes Mellitus Outpatient Diabetes medications: Glipizide 20-30 mg daily, Metformin 1000 mg bid Current orders for Inpatient glycemic control:  Lantus 14 units q HS, Novolog sensitive tid with meals and HS  Inpatient Diabetes Program Recommendations:   Consider adding Novolog meal coverage 3 units tid with meals while patient is in the hospital. Also consider changing bedtime scale to HS scale per Glycemic Control order set? A1C indicates poor control of diabetes prior to admit.  Likely will need insulin at discharge?  Will talk with patient regarding home diabetes management.    Thanks, Adah Perl, RN, BC-ADM Inpatient Diabetes Coordinator Pager [phone] (8a-5p)

## 2015-07-11 NOTE — Progress Notes (Signed)
SUBJECTIVE:  The patient reports feeling better. [name] noted to go into atrial fibrillation with episodes of rapid ventricular response and heart rate occasionally going up to 140 bpm. Renal function worsened with diuresis with a creatinine of 1.76. IV Lasix is currently on hold. He complains of intermittent substernal chest tightness.   Filed Vitals:   07/11/15 0255 07/11/15 0300 07/11/15 0315 07/11/15 0330  BP:  87/54 99/74 114/86  Pulse:  97 96 100  Temp:    98.7 F (37.1 C)  TempSrc:    Oral  Resp:    18  Height:      Weight:    299 lb 12.4 oz (135.978 kg)  SpO2: 94%   95%    Intake/Output Summary (Last 24 hours) at 07/11/15 0852 Last data filed at 07/11/15 [id number]  Gross per 24 hour  Intake    480 ml  Output   2425 ml  Net  -1945 ml    LABS: Basic Metabolic Panel:  Recent Labs  07/09/15 0455 07/10/15 0554 07/11/15 0437  NA 139 138 138  K 4.1 3.3* 4.2  CL 99* 96* 94*  CO2 29 28 33*  GLUCOSE 214* 190* 149*  BUN 31* 31* 31*  CREATININE 1.38* 1.44* 1.76*  CALCIUM 9.5 9.3 9.6  MG 2.3  --   --    Liver Function Tests: No results for input(s): AST, ALT, ALKPHOS, BILITOT, PROT, ALBUMIN in the last 72 hours. No results for input(s): LIPASE, AMYLASE in the last 72 hours. CBC:  Recent Labs  07/08/15 2011 07/09/15 0455  WBC 6.3 6.8  HGB 13.4 13.7  HCT 41.2 41.5  MCV 99.4 97.0  PLT 178 160   Cardiac Enzymes:  Recent Labs  07/08/15 2327 07/09/15 0455 07/09/15 1055  TROPONINI 0.12* 0.12* 0.14*   BNP: Invalid input(s): POCBNP D-Dimer: No results for input(s): DDIMER in the last 72 hours. Hemoglobin A1C:  Recent Labs  07/08/15 2011  HGBA1C 11.0*   Fasting Lipid Panel:  Recent Labs  07/09/15 0455  CHOL 153  HDL 34*  LDLCALC 85  TRIG 168*  CHOLHDL 4.5   Thyroid Function Tests: No results for input(s): TSH, T4TOTAL, T3FREE, THYROIDAB in the last 72 hours.  Invalid input(s): FREET3 Anemia Panel: No results for input(s): VITAMINB12, FOLATE,  FERRITIN, TIBC, IRON, RETICCTPCT in the last 72 hours.   PHYSICAL EXAM General: Well developed, well nourished, in no acute distress HEENT:  Normocephalic and atramatic Neck: Jugular veins are not well visualized  Lungs: Diminished breath sounds at the bases bilaterally with few crackles. Heart: Irregularly irregular . Normal S1 and S2 without gallops or murmurs.  Abdomen: Bowel sounds are positive, abdomen soft and non-tender  Msk:  Back normal, normal gait. Normal strength and tone for age. Extremities: No clubbing, cyanosis. Moderately to severe leg edema Neuro: Alert and oriented X 3. Psych:  Good affect, responds appropriately  TELEMETRY: Reviewed telemetry pt in sinus tachycardia with frequent PACs with episodes of A. fib with RVR  ASSESSMENT AND PLAN:  1. Acute on chronic systolic heart failure: Echocardiogram yesterday showed an ejection fraction of 15-20% with moderate mitral regurgitation and moderate pulmonary hypertension. His previous ejection fraction was reported to be 25%.  The patient still seems to be fluid overloaded. IV Lasix was held due to worsening kidney function. I'm going to try with torsemide 40 mg by mouth twice daily to evaluate his response. Monitor renal function closely. Continue treatment with Toprol, lisinopril and spironolactone. Once his tachycardia improved,  we should consider switching metoprolol to carvedilol.  2. Newly diagnosed atrial fibrillation with rapid ventricular response: Most of his rhythm seems to be due to sinus tachycardia with PACs. However, he is having episodes of A. fib with RVR on telemetry as well. I agree with anticoagulation which was started with Eliquis.  It is difficult to increase the dose of metoprolol as his blood pressure is relatively low. Thus, I elected to start him on IV amiodarone to see if we can maintain him in sinus rhythm.  3. Coronary artery disease with previous PCI: The patient reports intermittent episodes of  substernal chest tightness at rest. I think he will require a repeat cardiac catheterization at some point but this likely can be done in the outpatient setting after his heart failure is more controlled.    Kathlyn Sacramento, MD, [facility] 07/11/2015 8:52 AM

## 2015-07-11 NOTE — Progress Notes (Signed)
Pt having reports of chest tightness and sob. Vitals signs are stable. No distress noted. MD Pyreddy notified. Orders given for 0.25 mg xanax q8h prn. Will continue to  Monitor.   Iran Sizer M

## 2015-07-11 NOTE — Care Management (Signed)
Patient moved to [place] from [place] in June 2016 when he lost his home in foreclosure.  He had not been able to work and filed for disability.  After two years he was declined.  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  He has signed up for insurance through the affordable care web site and his [place] goes into affect 3/1.  he is not sure about actual benefit coverage or medications patient lives in [place] but will get established with PCP and cardiology in [place].  He will qualify for assistance of [facility] if PCP are in [place]. At present anticipate discharge on new Eliquis. Currently on Amiodarone dirp

## 2015-07-11 NOTE — Progress Notes (Signed)
Pt started on amiodarone gtt. New iv placed for dedicated line.

## 2015-07-11 NOTE — Progress Notes (Signed)
Rn receieved orders from MD [place] to continue to [facility] gtt. Will continue to monitor.   Iran Sizer M

## 2015-07-11 NOTE — Progress Notes (Signed)
PT Cancellation Note  Patient Details Name: [name] MRN: [id number] DOB: 08-05-1950   Cancelled Treatment:    Reason Eval/Treat Not Completed: Medical issues which prohibited therapy. PT reviewed chart and noted patient is on IV infusion of amioderone. Given the need for this to control his cardiopulmonary status, PT will defer mobility until patient is more stable. PT will continue to follow/re-attempt when patient is available and appropriate.   Kerman Passey, PT, DPT    07/11/2015, 12:38 PM

## 2015-07-11 NOTE — Progress Notes (Signed)
Pt complaining of lightheadedness, Vitals taken. BP 87/63. Amio gtt paused. Cardiologist paged. Awaiting call back. Will continue to monitor.   [name] M

## 2015-07-11 NOTE — Progress Notes (Addendum)
Inpatient Diabetes Program Recommendations  AACE/ADA: New Consensus Statement on Inpatient Glycemic Control (2015)  Target Ranges:  Prepandial:   less than 140 mg/dL      Peak postprandial:   less than 180 mg/dL (1-2 hours)      Critically ill patients:  140 - 180 mg/dL   Review of Glycemic Control:  Results for [name], [name] (MRN [id number]) as of 07/11/2015 12:53  Ref. Range 07/10/2015 11:16 07/10/2015 16:32 07/10/2015 21:30 07/11/2015 07:37 07/11/2015 11:25  Glucose-Capillary Latest Ref Range: 65-99 mg/dL 263 (H) 193 (H) 255 (H) 132 (H) 291 (H)  Results for [name], [name] (MRN [id number]) as of 07/11/2015 12:53  Ref. Range 07/08/2015 20:11  Hemoglobin A1C Latest Ref Range: 4.0-6.0 % 11.0 (H)    Inpatient Diabetes Program Recommendations:    Note that A1C elevated.  Spoke with patient regarding A1C results.  He states that he has not had an A1C done in over a year and 1/2 but states he was surprised at how high it was.  He states that he has not been consistently taking diabetes medications at home and has been eating "whatever" at home also.  Discussed potential for insulin at home.  Patient states that his brother has "insulin dependent diabetes" and is familiar with insulin.  He does not have a PCP at this time but states he thinks he will follow up "here".  Ordered "Living Well with Diabetes" booklet for patient.   May consider adding Novolog meal coverage 3 units tid with meals and continue to titrate Lantus as needed based on fasting CBG's.  May need more affordable insulin at discharge if patient does not have Rx. Coverage?    MD if patient is to be d/c'd home on insulin.  Please order insulin teaching by bedside RN.    Will follow.  Thanks, Adah Perl, RN, BC-ADM Inpatient Diabetes Coordinator Pager [phone] (8a-5p)

## 2015-07-12 DIAGNOSIS — I272 Other secondary pulmonary hypertension: Secondary | ICD-10-CM

## 2015-07-12 DIAGNOSIS — N189 Chronic kidney disease, unspecified: Secondary | ICD-10-CM

## 2015-07-12 DIAGNOSIS — I1310 Hypertensive heart and chronic kidney disease without heart failure, with stage 1 through stage 4 chronic kidney disease, or unspecified chronic kidney disease: Secondary | ICD-10-CM | POA: Diagnosis present

## 2015-07-12 DIAGNOSIS — N179 Acute kidney failure, unspecified: Secondary | ICD-10-CM | POA: Diagnosis present

## 2015-07-12 DIAGNOSIS — I130 Hypertensive heart and chronic kidney disease with heart failure and stage 1 through stage 4 chronic kidney disease, or unspecified chronic kidney disease: Principal | ICD-10-CM

## 2015-07-12 DIAGNOSIS — I951 Orthostatic hypotension: Secondary | ICD-10-CM | POA: Diagnosis present

## 2015-07-12 DIAGNOSIS — I5043 Acute on chronic combined systolic (congestive) and diastolic (congestive) heart failure: Secondary | ICD-10-CM | POA: Diagnosis present

## 2015-07-12 DIAGNOSIS — I5041 Acute combined systolic (congestive) and diastolic (congestive) heart failure: Secondary | ICD-10-CM

## 2015-07-12 DIAGNOSIS — I480 Paroxysmal atrial fibrillation: Secondary | ICD-10-CM

## 2015-07-12 LAB — BASIC METABOLIC PANEL
ANION GAP: 9 (ref 5–15)
BUN: 40 mg/dL — ABNORMAL HIGH (ref 6–20)
CALCIUM: 9.5 mg/dL (ref 8.9–10.3)
CO2: 33 mmol/L — ABNORMAL HIGH (ref 22–32)
CREATININE: 2.02 mg/dL — AB (ref 0.61–1.24)
Chloride: 91 mmol/L — ABNORMAL LOW (ref 101–111)
GFR, EST AFRICAN AMERICAN: 39 mL/min — AB (ref >60)
GFR, EST NON AFRICAN AMERICAN: 33 mL/min — AB (ref >60)
Glucose, Bld: 249 mg/dL — ABNORMAL HIGH (ref 65–99)
Potassium: 4.1 mmol/L (ref 3.5–5.1)
SODIUM: 133 mmol/L — AB (ref 135–145)

## 2015-07-12 LAB — GLUCOSE, CAPILLARY
GLUCOSE-CAPILLARY: 237 mg/dL — AB (ref 65–99)
GLUCOSE-CAPILLARY: 241 mg/dL — AB (ref 65–99)
Glucose-Capillary: 247 mg/dL — ABNORMAL HIGH (ref 65–99)
Glucose-Capillary: 230 mg/dL — ABNORMAL HIGH (ref 65–99)
Glucose-Capillary: 196 mg/dL — ABNORMAL HIGH (ref 65–99)

## 2015-07-12 MED ORDER — METOPROLOL SUCCINATE ER 25 MG PO TB24
25.0000 mg | ORAL_TABLET | Freq: Two times a day (BID) | ORAL | Status: DC
  Administered 2015-07-13: 25 mg via ORAL
  Filled 2015-07-12: qty 1

## 2015-07-12 MED ORDER — INSULIN GLARGINE 100 UNIT/ML ~~LOC~~ SOLN
20.0000 [IU] | Freq: Every day | SUBCUTANEOUS | Status: DC
  Administered 2015-07-12 – 2015-07-25 (×12): 20 [IU] via SUBCUTANEOUS
  Filled 2015-07-12 (×17): qty 0.2

## 2015-07-12 MED ORDER — PROMETHAZINE HCL 25 MG PO TABS
12.5000 mg | ORAL_TABLET | ORAL | Status: DC | PRN
  Administered 2015-07-12: 12.5 mg via ORAL
  Filled 2015-07-12 (×2): qty 1

## 2015-07-12 MED ORDER — AMIODARONE HCL 200 MG PO TABS
200.0000 mg | ORAL_TABLET | Freq: Two times a day (BID) | ORAL | Status: DC
  Administered 2015-07-12 – 2015-07-28 (×31): 200 mg via ORAL
  Filled 2015-07-12 (×31): qty 1

## 2015-07-12 NOTE — Progress Notes (Signed)
Patient ID: [name], male   DOB: [birth year], 65 y.o.   MRN: [id number] [facility] PROGRESS NOTE  [name] [id number] DOB: 12/07/1950 DOA: 07/08/2015 PCP: No PCP Per Patient  HPI/Subjective: Patient had nausea and vomiting last night. Still feels a little nauseous this morning. Still feel short of breath at times.  Objective: Filed Vitals:   07/12/15 1500 07/12/15 1549  BP: 98/72 90/54  Pulse:    Temp:    Resp:      Filed Weights   07/10/15 0408 07/11/15 0330 07/12/15 0523  Weight: 136.805 kg (301 lb 9.6 oz) 135.978 kg (299 lb 12.4 oz) 135.37 kg (298 lb 7 oz)    ROS: Review of Systems  Constitutional: Negative for fever and chills.  Eyes: Negative for blurred vision.  Respiratory: Positive for shortness of breath. Negative for cough.   Cardiovascular: Negative for chest pain.  Gastrointestinal: Negative for nausea, vomiting, abdominal pain, diarrhea and constipation.  Genitourinary: Negative for dysuria.  Musculoskeletal: Negative for joint pain.  Neurological: Negative for dizziness and headaches.   Exam: Physical Exam  Constitutional: [name] oriented to person, place, and time.  HENT:  Nose: No mucosal edema.  Mouth/Throat: No oropharyngeal exudate or posterior oropharyngeal edema.  Eyes: Conjunctivae, EOM and lids are normal. Pupils are equal, round, and reactive to light.  Neck: No JVD present. Carotid bruit is not present. No edema present. No thyroid mass and no thyromegaly present.  Cardiovascular: S1 normal and S2 normal.  An irregularly irregular rhythm present. Exam reveals no gallop.   Murmur heard.  Systolic murmur is present with a grade of 2/6  Pulses:      Dorsalis pedis pulses are 2+ on the right side, and 2+ on the left side.  Respiratory: No respiratory distress. He has decreased breath sounds in the right lower field and the left lower field. He has no wheezes. He has no rhonchi. He has no rales.  GI: Soft. Bowel sounds are normal.  There is no tenderness.  Musculoskeletal:       Right ankle: He exhibits swelling.       Left ankle: He exhibits swelling.  Lymphadenopathy:    He has no cervical adenopathy.  Neurological: [name] alert and oriented to person, place, and time. No cranial nerve deficit.  Skin: Skin is warm. Nails show no clubbing.  Erythema bilateral lower extremities and feet. Bilateral lower extremities are normal or warm.  Psychiatric: He has a normal mood and affect.      Data Reviewed: Basic Metabolic Panel:  Recent Labs Lab 07/08/15 2011 07/09/15 0455 07/10/15 0554 07/11/15 0437 07/12/15 0524  NA 139 139 138 138 133*  K 4.7 4.1 3.3* 4.2 4.1  CL 98* 99* 96* 94* 91*  CO2 30 29 28  33* 33*  GLUCOSE 318* 214* 190* 149* 249*  BUN 33* 31* 31* 31* 40*  CREATININE 1.48* 1.38* 1.44* 1.76* 2.02*  CALCIUM 9.7 9.5 9.3 9.6 9.5  MG  --  2.3  --   --   --    CBC:  Recent Labs Lab 07/08/15 2011 07/09/15 0455  WBC 6.3 6.8  HGB 13.4 13.7  HCT 41.2 41.5  MCV 99.4 97.0  PLT 178 160   Cardiac Enzymes:  Recent Labs Lab 07/08/15 2011 07/08/15 2327 07/09/15 0455 07/09/15 1055  TROPONINI 0.11* 0.12* 0.12* 0.14*   BNP (last 3 results)  Recent Labs  07/09/15 1048  BNP 1891.0*    CBG:  Recent Labs Lab 07/11/15 1713  07/11/15 2137 07/12/15 0011 07/12/15 0726 07/12/15 1141  GLUCAP 274* 246* 241* 237* 247*     Scheduled Meds: . amiodarone  200 mg Oral BID  . apixaban  5 mg Oral BID  . aspirin  81 mg Oral Daily  . atorvastatin  40 mg Oral q1800  . cephALEXin  500 mg Oral 3 times per day  . insulin aspart  0-5 Units Subcutaneous QHS  . insulin aspart  0-9 Units Subcutaneous TID WC  . insulin glargine  20 Units Subcutaneous QHS  . lisinopril  5 mg Oral Daily  . metoprolol succinate  50 mg Oral BID  . potassium chloride  10 mEq Oral Daily  . sodium chloride flush  3 mL Intravenous Q12H  . spironolactone  25 mg Oral QHS    Assessment/Plan:  1. Acute systolic congestive  heart failure with severe cardiomyopathy with an EF of 15-20%. hold torsemide and lisinopril with acute renal failure. ower the dose of Toprol-XL 2. Atrial fibrillation new onset. Amiodarone orally, decreased dose of Toprol to 25 mg twice a day starting tomorrow with the patient's hypotension today. continue Eliquis 5 mg twice a day. 3. Bilateral lower extremity cellulitis -switch Rocephin to Keflex 4. Type 2 diabetes mellitus- on sliding scale and increase Lantus at night. Oral meds on hold at this point. 5. Obesity and sleep apnea 6. Acute on Chronic kidney disease stage 3. Hold diuresis today. Check a BMP tomorrow morning 7. Hyperlipidemia unspecified on atorvastatin 8. Relative hypotension The dose of the Toprol, hold lisinopril.  Code Status:     Code Status Orders        Start     Ordered   07/08/15 2228  Full code   Continuous     07/08/15 2228    Code Status History    Date Active Date Inactive Code Status Order ID Comments User Context   This patient has a current code status but no historical code status.     Disposition Plan:   Consultants:  Cardiology  Antibiotics:  Keflex  Time spent: 20 minutes  Loletha Grayer  [facility]

## 2015-07-12 NOTE — Progress Notes (Signed)
Patient: [name] / Admit Date: 07/08/2015 / Date of Encounter: 07/12/2015, 6:20 PM   Subjective: Continued shortness of breath, leg swelling "Legs are double what they normally are" Has not been out of bed much at all, anorexia, secondary to nausea and vomiting last night and again today. Reports symptoms have been coming on for several months time Has not seen cardiology in several years time [place]. Since then no workup or management   Review of Systems: Review of Systems  Respiratory: Positive for shortness of breath.   Cardiovascular: Positive for orthopnea and leg swelling.  Gastrointestinal: Positive for nausea and vomiting.  Musculoskeletal: Negative.   Neurological: Positive for weakness.  Psychiatric/Behavioral: Negative.   All other systems reviewed and are negative.   Objective: Telemetry: Telemetry showing normal sinus rhythm for the past 24 hours Physical Exam: Blood pressure 92/70, pulse 90, temperature 97.4 F (36.3 C), temperature source Oral, resp. rate 21, height 6' (1.829 m), weight 298 lb 7 oz (135.37 kg), SpO2 93 %. Body mass index is 40.47 kg/(m^2). General: Well developed, well nourished, in no acute distress HEENT: Normocephalic and atramatic Neck: Jugular veins are not well visualized  Lungs: Diminished breath sounds at the bases bilaterally with few crackles. Heart: Regular rate and rhythm, Normal S1 and S2 without gallops or murmurs.  Abdomen: Bowel sounds are positive, abdomen soft and non-tender , appears distended Msk: Back normal, normal gait. Normal strength and tone for age. Extremities: No clubbing, cyanosis. Moderately  leg edema, regions of excoriation, discolored from chronic edema Neuro: Alert and oriented X 3. Psych: Good affect, responds appropriately   Intake/Output Summary (Last 24 hours) at 07/12/15 1820 Last data filed at 07/12/15 1339  Gross per 24 hour  Intake    240 ml    Output      0 ml  Net    240 ml    Inpatient Medications:  . amiodarone  200 mg Oral BID  . apixaban  5 mg Oral BID  . aspirin  81 mg Oral Daily  . atorvastatin  40 mg Oral q1800  . cephALEXin  500 mg Oral 3 times per day  . insulin aspart  0-5 Units Subcutaneous QHS  . insulin aspart  0-9 Units Subcutaneous TID WC  . insulin glargine  20 Units Subcutaneous QHS  . [START ON 07/13/2015] metoprolol succinate  25 mg Oral BID  . potassium chloride  10 mEq Oral Daily  . sodium chloride flush  3 mL Intravenous Q12H  . spironolactone  25 mg Oral QHS   Infusions:    Labs:  Recent Labs  07/11/15 0437 07/12/15 0524  NA 138 133*  K 4.2 4.1  CL 94* 91*  CO2 33* 33*  GLUCOSE 149* 249*  BUN 31* 40*  CREATININE 1.76* 2.02*  CALCIUM 9.6 9.5   No results for input(s): AST, ALT, ALKPHOS, BILITOT, PROT, ALBUMIN in the last 72 hours. No results for input(s): WBC, NEUTROABS, HGB, HCT, MCV, PLT in the last 72 hours. No results for input(s): CKTOTAL, CKMB, TROPONINI in the last 72 hours. Invalid input(s): POCBNP No results for input(s): HGBA1C in the last 72 hours.   Weights: Filed Weights   07/10/15 0408 07/11/15 0330 07/12/15 0523  Weight: 301 lb 9.6 oz (136.805 kg) 299 lb 12.4 oz (135.978 kg) 298 lb 7 oz (135.37 kg)     Radiology/Studies:  Dg Chest 2 View  07/08/2015  CLINICAL DATA:  Patient with  a history of chf and cardiomyopathy. Patient states that since December he has developed shortness of breath, bilateral leg swelling and vertigo. Patient states that over the last week the symptoms have become worse. EXAM: CHEST  2 VIEW COMPARISON:  None. FINDINGS: Normal cardiac silhouette. There is central venous congestion. No effusion, infiltrate pneumothorax. No pleural fluid. No acute osseous abnormality. IMPRESSION: Central venous congestion.  No pleural floor no overt edema. Electronically Signed   By: Suzy Bouchard M.D.   On: 07/08/2015 20:43     Assessment and Plan  65 y.o. male    1. Acute on chronic systolic heart failure: ejection fraction of 15-20% with moderate mitral regurgitation and moderate pulmonary hypertension Severely Dilated inferior vena cava.  --He has not responded to IV diuresis, even torsemide with minimal urine output, Now with worsening renal dysfunction (likely cardiorenal) --Long discussion with him today, he reports symptoms have been coming on for several months. Suspect he will need dopamine and dobutamine inotropes with IV Lasix (Do not think he will tolerate milrinone given his low blood pressure and renal dysfunction)  2. Newly diagnosed atrial fibrillation with rapid ventricular response: Tolerated amiodarone infusion overnight, maintaining normal sinus rhythm Changed to amiodarone 200 mg by mouth twice a day If he has recurrent atrial fibrillation, we'll need to go back on IV infusion for higher dose pill Amiodarone likely not contributing to his hypotension  3. Coronary artery disease with previous PCI: Given the decline in his ejection fraction, will need cardiac catheterization Using pressors, dopamine and dobutamine, may help with renal function, diuresis May reach a point where renal function improved to where he can tolerate cardiac catheterization  4.Morbid obesity Likely sleep apnea, needs CPAP  5. Acute on chronic renal failure Minimal urine output given his dilated IVC, moderately elevated right heart pressures, systolic CHF   likely cardiorenal, low cardiac output   Total encounter time more than 35 minutes  Greater than 50% was spent in counseling and coordination of care with the patient    Signed, Esmond Plants, MD, Ph.D. [facility] 07/12/2015, 6:20 PM

## 2015-07-12 NOTE — Progress Notes (Addendum)
Inpatient Diabetes Program Recommendations  AACE/ADA: New Consensus Statement on Inpatient Glycemic Control (2015)  Target Ranges:  Prepandial:   less than 140 mg/dL      Peak postprandial:   less than 180 mg/dL (1-2 hours)      Critically ill patients:  140 - 180 mg/dL   Review of Glycemic Control  Results for [name], [name] (MRN [id number]) as of 07/12/2015 08:18  Ref. Range 07/11/2015 11:25 07/11/2015 17:13 07/11/2015 21:37 07/12/2015 00:11 07/12/2015 07:26  Glucose-Capillary Latest Ref Range: 65-99 mg/dL 291 (H) 274 (H) 246 (H) 241 (H) 237 (H)    Diabetes history: Diabetes Mellitus Outpatient Diabetes medications: Glipizide 20-30 mg daily, Metformin 1000 mg bid Current orders for Inpatient glycemic control:  Lantus 14 units q HS, Novolog sensitive tid with meals and Novolog 0-5 units QHS  Inpatient Diabetes Program Recommendations:  Consider increasing Lantus to 20 units qhs (0.15units/kg)  - fasting blood sugar 237mg /dl  Consider adding Novolog meal coverage 3 units tid with meals while patient is in the hospital. Patient does not have insurance coverage until August 05, 2015 and MAY qualify for medication management clinic.  With poor renal function, if he does not go home on oral meds;if he is discharged home on insulin- please order insulin teaching for staff to educate patient before he goes home.  Please consider changing diet to carb modified/heart healthy/2gram sodium restricted  Gentry Fitz, RN, [place], [place], CDE Diabetes Coordinator Inpatient Diabetes Program  [phone] (Team Pager) [phone] ([place]) 07/12/2015 8:26 AM

## 2015-07-12 NOTE — Progress Notes (Signed)
Patient C/O Nausea and vomiting and feeling light headed. BP 88/65 in chair on amiodarone drip. Dr. Rockey Situ notified orders to stop drip received. Patient back in bed and bp 98/72 and states he feels a little better. Will continue to monitor.

## 2015-07-12 NOTE — Progress Notes (Signed)
PT Cancellation Note  Patient Details Name: [name] MRN: [id number] DOB: 1950-06-24   Cancelled Treatment:    Reason Eval/Treat Not Completed: Medical issues which prohibited therapy.  Chart reviewed and spoke with RN and patient.  Pt continues with IV amioderone drip this date.  Will continue to defer PT until pt more stable.  PT will continue to follow as appropriate.  Jacquelyn Antony A Cletus Mehlhoff, PT 07/12/2015, 11:44 AM

## 2015-07-13 ENCOUNTER — Inpatient Hospital Stay: Payer: Medicaid Other

## 2015-07-13 ENCOUNTER — Encounter: Payer: Self-pay | Admitting: Student

## 2015-07-13 DIAGNOSIS — I2789 Other specified pulmonary heart diseases: Secondary | ICD-10-CM

## 2015-07-13 DIAGNOSIS — R7989 Other specified abnormal findings of blood chemistry: Secondary | ICD-10-CM

## 2015-07-13 DIAGNOSIS — E1165 Type 2 diabetes mellitus with hyperglycemia: Secondary | ICD-10-CM

## 2015-07-13 DIAGNOSIS — IMO0002 Reserved for concepts with insufficient information to code with codable children: Secondary | ICD-10-CM

## 2015-07-13 DIAGNOSIS — I5043 Acute on chronic combined systolic (congestive) and diastolic (congestive) heart failure: Secondary | ICD-10-CM

## 2015-07-13 DIAGNOSIS — E871 Hypo-osmolality and hyponatremia: Secondary | ICD-10-CM

## 2015-07-13 LAB — BASIC METABOLIC PANEL
Anion gap: 12 (ref 5–15)
BUN: 43 mg/dL — AB (ref 6–20)
CHLORIDE: 87 mmol/L — AB (ref 101–111)
CO2: 30 mmol/L (ref 22–32)
Calcium: 8.9 mg/dL (ref 8.9–10.3)
Creatinine, Ser: 2.13 mg/dL — ABNORMAL HIGH (ref 0.61–1.24)
GFR calc Af Amer: 36 mL/min — ABNORMAL LOW (ref >60)
GFR calc non Af Amer: 31 mL/min — ABNORMAL LOW (ref >60)
GLUCOSE: 192 mg/dL — AB (ref 65–99)
POTASSIUM: 4 mmol/L (ref 3.5–5.1)
SODIUM: 129 mmol/L — AB (ref 135–145)

## 2015-07-13 LAB — GLUCOSE, CAPILLARY
GLUCOSE-CAPILLARY: 193 mg/dL — AB (ref 65–99)
GLUCOSE-CAPILLARY: 290 mg/dL — AB (ref 65–99)
GLUCOSE-CAPILLARY: 267 mg/dL — AB (ref 65–99)
Glucose-Capillary: 203 mg/dL — ABNORMAL HIGH (ref 65–99)

## 2015-07-13 IMAGING — CR DG CHEST 1V PORT
1 series · 1 of 1 positions shown · non-contrast
Comparison: Chest x-ray dated [DATE].

CLINICAL DATA: PICC line placement

EXAM:
PORTABLE CHEST 1 VIEW

[ap]
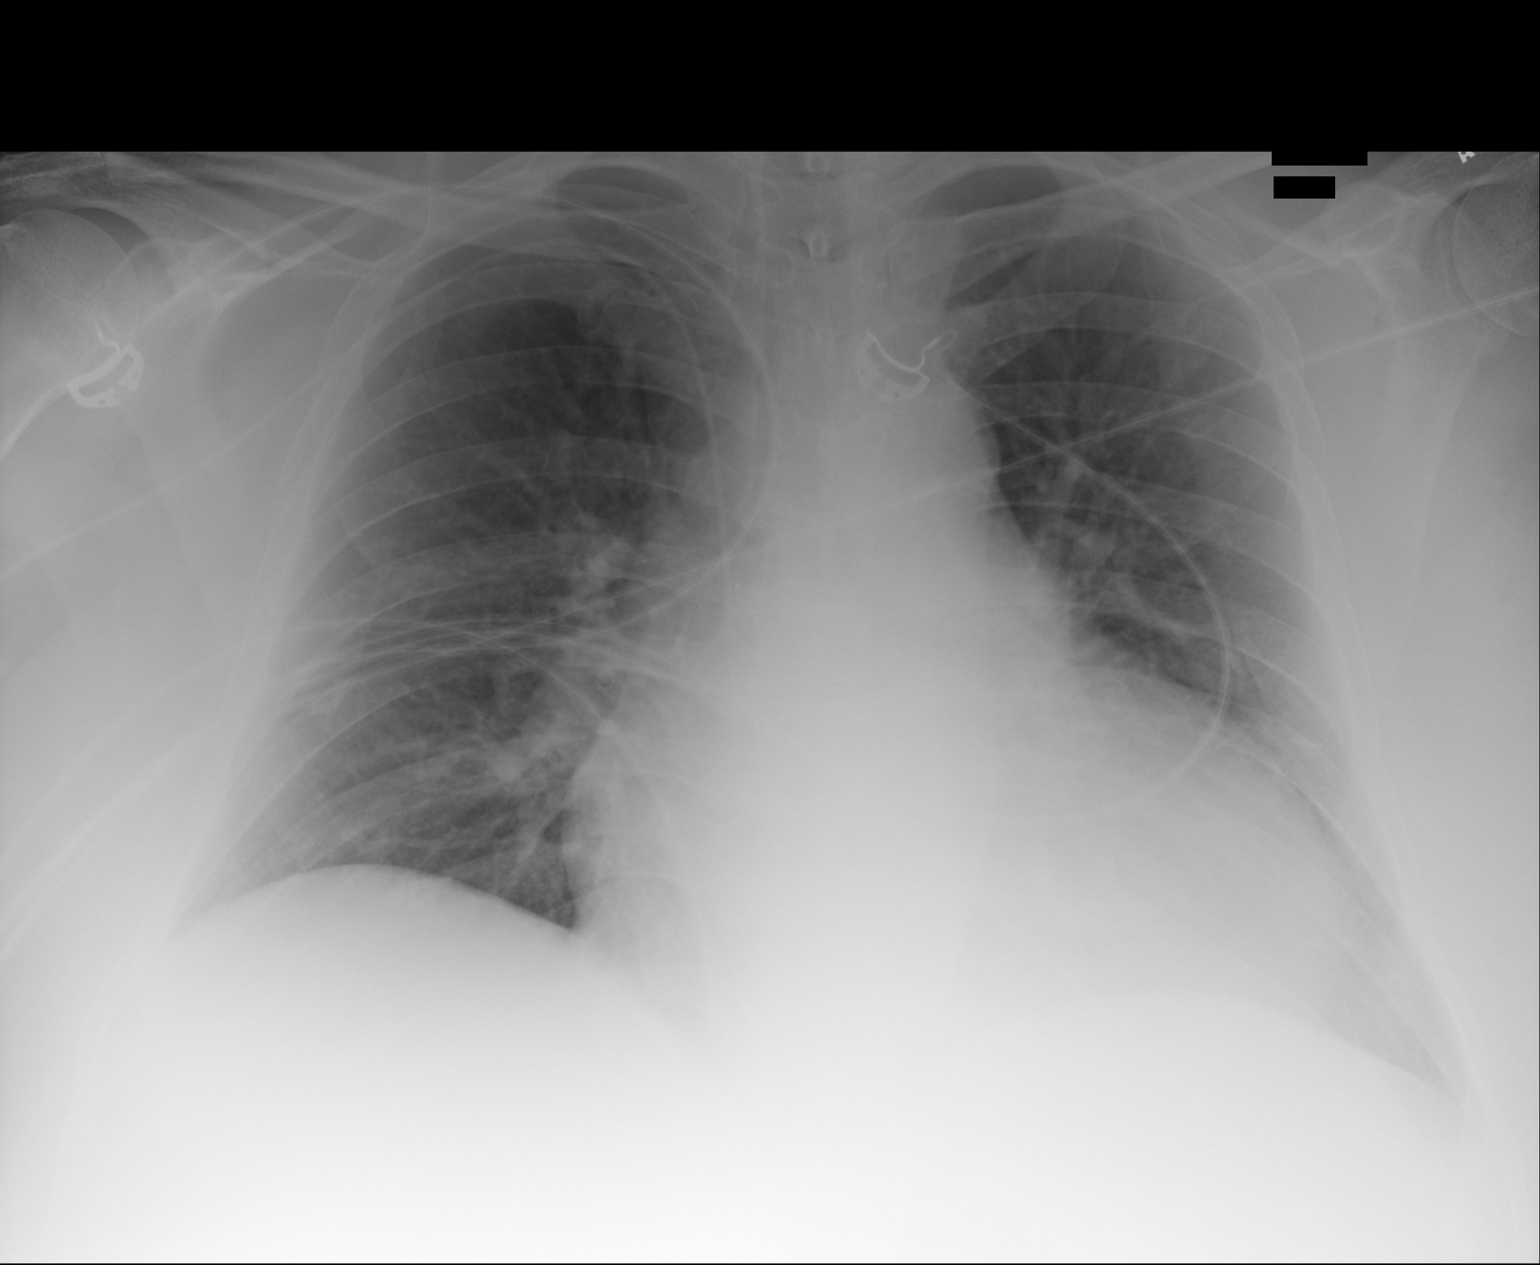

[1 of 1 positions shown; findings below may reference images not displayed]

FINDINGS: Interval placement of a right-sided PICC line with tip
well-positioned in the lower SVC, near the expected location of the
cavoatrial junction. Cardiomegaly is stable. Lungs are clear. No
pleural effusion seen. No pneumothorax seen.
IMPRESSION: 1. Right-sided PICC line placement, well positioned with tip near
the expected location of the cavoatrial junction.
2. Stable cardiomegaly.

## 2015-07-13 MED ORDER — INSULIN ASPART 100 UNIT/ML ~~LOC~~ SOLN
3.0000 [IU] | Freq: Three times a day (TID) | SUBCUTANEOUS | Status: DC
  Administered 2015-07-13 – 2015-07-18 (×14): 3 [IU] via SUBCUTANEOUS
  Filled 2015-07-13 (×5): qty 3
  Filled 2015-07-13: qty 5
  Filled 2015-07-13 (×6): qty 3

## 2015-07-13 MED ORDER — DOBUTAMINE IN D5W 4-5 MG/ML-% IV SOLN
2.5000 ug/kg/min | INTRAVENOUS | Status: DC
  Administered 2015-07-13 – 2015-07-15 (×2): 2.5 ug/kg/min via INTRAVENOUS
  Administered 2015-07-17: 3.5 ug/kg/min via INTRAVENOUS
  Administered 2015-07-18: 5 ug/kg/min via INTRAVENOUS
  Administered 2015-07-20: 2.5 ug/kg/min via INTRAVENOUS
  Filled 2015-07-13 (×5): qty 250

## 2015-07-13 MED ORDER — INSULIN ASPART 100 UNIT/ML ~~LOC~~ SOLN: 0.0000 [IU] | Freq: Three times a day (TID) | SUBCUTANEOUS | Status: DC

## 2015-07-13 MED ORDER — INSULIN ASPART 100 UNIT/ML ~~LOC~~ SOLN: 0.0000 [IU] | Freq: Every day | SUBCUTANEOUS | Status: DC

## 2015-07-13 MED ORDER — DOPAMINE-DEXTROSE 3.2-5 MG/ML-% IV SOLN
3.0000 ug/kg/min | INTRAVENOUS | Status: DC
  Filled 2015-07-13: qty 250

## 2015-07-13 NOTE — Progress Notes (Signed)
ANTICOAGULATION CONSULT NOTE - Follow up  Pharmacy Consult for Apixaban Indication: atrial fibrillation  No Known Allergies  Patient Measurements: Height: 6' (182.9 cm) Weight: (!) 306 lb (138.801 kg) IBW/kg (Calculated) : 77.6   Vital Signs: Temp: 97.5 F (36.4 C) (02/08 0630) Temp Source: Oral (02/08 0630) BP: 103/82 mmHg (02/08 0630) Pulse Rate: 87 (02/08 0630)  Labs:  Recent Labs  07/11/15 0437 07/12/15 0524 07/13/15 0719  CREATININE 1.76* 2.02* 2.13*   Lab Results  Component Value Date   HGB 13.7 07/09/2015   Lab Results  Component Value Date   PLT 160 07/09/2015   Estimated Creatinine Clearance: 51.3 mL/min (by C-G formula based on Cr of 2.13).   Assessment: 65 yo male found to have new onset AFib starting on Eliquis.   2/4 Hgb 13.7, Plt 160  Goal of Therapy:  Monitor platelets by anticoagulation protocol: Yes   Plan:  D/c SQ heparin Will start apixaban 5 mg PO BID (age <80, SCr <1.5, Wt>60kg) Will need CBC q3 days  2/8: CBC with am labs  Leo Weyandt A 07/13/2015,12:04 PM

## 2015-07-13 NOTE — Care Management (Addendum)
Plan for patient to transfer to icu for dobutamine and dopamine drip.  Continue to anticipate need for CM to assess medications at discharge.  Patient continues to state that he will be followed by physicians in [facility] which will allow assist from Medication Management Clinic.  Will have clinic also assist patient with completion of Eliquis patient assistance application.  have not provided patient with the Eliquis 30 day coupon yet for fear of it getting lost.  He will have to be assessed for need of home 02  prior to discharge

## 2015-07-13 NOTE — Progress Notes (Addendum)
Inpatient Diabetes Program Recommendations  AACE/ADA: New Consensus Statement on Inpatient Glycemic Control (2015)  Target Ranges:  Prepandial:   less than 140 mg/dL      Peak postprandial:   less than 180 mg/dL (1-2 hours)      Critically ill patients:  140 - 180 mg/dL   Review of Glycemic Control:  Results for [name], [name] (MRN [id number]) as of 07/13/2015 13:19  Ref. Range 07/12/2015 11:41 07/12/2015 16:17 07/12/2015 21:15 07/13/2015 07:48 07/13/2015 11:45  Glucose-Capillary Latest Ref Range: 65-99 mg/dL 247 (H) 230 (H) 196 (H) 193 (H) 290 (H)   Current orders for Inpatient glycemic control: Novolog sensitive tid with meals and HS, Lantus 20 units daily  Inpatient Diabetes Program Recommendations:    May consider adding Novolog meal coverage 4 units tid with meals (while in the hospital).  No insurance on file.  May need less expensive insulin at discharge such as NPH or Novolin 70/30, due to delay of insurance coverage til August 03, 2015.   Thanks, Adah Perl, RN, BC-ADM Inpatient Diabetes Coordinator Pager [phone] (8a-5p)

## 2015-07-13 NOTE — Progress Notes (Signed)
Patient transfer to CCU 16 report called to [facility].

## 2015-07-13 NOTE — Progress Notes (Addendum)
Patient ID: [name], male   DOB: 04-06-1951, 65 y.o.   MRN: [id number] [facility] PROGRESS NOTE  [name] [id number] DOB: [birth year] DOA: 07/08/2015 PCP: No PCP Per Patient  HPI/Subjective: Patient's nausea is gone. Still with some shortness of breath. Legs are still swollen. Feeling a bit better with regards to his breathing.  Objective: Filed Vitals:   07/13/15 0630 07/13/15 1100  BP: 103/82 100/80  Pulse: 87 80  Temp: 97.5 F (36.4 C) 98.1 F (36.7 C)  Resp: 22 20    Filed Weights   07/11/15 0330 07/12/15 0523 07/13/15 0500  Weight: 135.978 kg (299 lb 12.4 oz) 135.37 kg (298 lb 7 oz) 138.801 kg (306 lb)    ROS: Review of Systems  Constitutional: Negative for fever and chills.  Eyes: Negative for blurred vision.  Respiratory: Positive for shortness of breath. Negative for cough.   Cardiovascular: Negative for chest pain.  Gastrointestinal: Negative for nausea, vomiting, abdominal pain, diarrhea and constipation.  Genitourinary: Negative for dysuria.  Musculoskeletal: Negative for joint pain.  Neurological: Negative for dizziness and headaches.   Exam: Physical Exam  Constitutional: [name] oriented to person, place, and time.  HENT:  Nose: No mucosal edema.  Mouth/Throat: No oropharyngeal exudate or posterior oropharyngeal edema.  Eyes: Conjunctivae, EOM and lids are normal. Pupils are equal, round, and reactive to light.  Neck: No JVD present. Carotid bruit is not present. No edema present. No thyroid mass and no thyromegaly present.  Cardiovascular: S1 normal and S2 normal.  An irregularly irregular rhythm present. Exam reveals no gallop.   Murmur heard.  Systolic murmur is present with a grade of 2/6  Pulses:      Dorsalis pedis pulses are 2+ on the right side, and 2+ on the left side.  Respiratory: No respiratory distress. He has no decreased breath sounds. He has no wheezes. He has no rhonchi. He has rales in the right lower field and the  left lower field.  GI: Soft. Bowel sounds are normal. There is no tenderness.  Musculoskeletal:       Right ankle: He exhibits swelling.       Left ankle: He exhibits swelling.  Lymphadenopathy:    He has no cervical adenopathy.  Neurological: [name] alert and oriented to person, place, and time. No cranial nerve deficit.  Skin: Skin is warm. Nails show no clubbing.  Erythema bilateral lower extremities and feet. Bilateral lower extremities are normal or warm.  Psychiatric: He has a normal mood and affect.      Data Reviewed: Basic Metabolic Panel:  Recent Labs Lab 07/09/15 0455 07/10/15 0554 07/11/15 0437 07/12/15 0524 07/13/15 0719  NA 139 138 138 133* 129*  K 4.1 3.3* 4.2 4.1 4.0  CL 99* 96* 94* 91* 87*  CO2 29 28 33* 33* 30  GLUCOSE 214* 190* 149* 249* 192*  BUN 31* 31* 31* 40* 43*  CREATININE 1.38* 1.44* 1.76* 2.02* 2.13*  CALCIUM 9.5 9.3 9.6 9.5 8.9  MG 2.3  --   --   --   --    CBC:  Recent Labs Lab 07/08/15 2011 07/09/15 0455  WBC 6.3 6.8  HGB 13.4 13.7  HCT 41.2 41.5  MCV 99.4 97.0  PLT 178 160   Cardiac Enzymes:  Recent Labs Lab 07/08/15 2011 07/08/15 2327 07/09/15 0455 07/09/15 1055  TROPONINI 0.11* 0.12* 0.12* 0.14*   BNP (last 3 results)  Recent Labs  07/09/15 1048  BNP 1891.0*    CBG:  Recent Labs Lab 07/12/15 1141 07/12/15 1617 07/12/15 2115 07/13/15 0748 07/13/15 1145  GLUCAP 247* 230* 196* 193* 290*     Scheduled Meds: . amiodarone  200 mg Oral BID  . apixaban  5 mg Oral BID  . aspirin  81 mg Oral Daily  . atorvastatin  40 mg Oral q1800  . cephALEXin  500 mg Oral 3 times per day  . insulin aspart  0-5 Units Subcutaneous QHS  . insulin aspart  0-9 Units Subcutaneous TID WC  . insulin aspart  3 Units Subcutaneous TID WC  . insulin glargine  20 Units Subcutaneous QHS  . metoprolol succinate  25 mg Oral BID  . potassium chloride  10 mEq Oral Daily  . sodium chloride flush  3 mL Intravenous Q12H  . spironolactone   25 mg Oral QHS    Assessment/Plan:  1. 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  Case discussed with cardiology Dr. Ellyn Hack. Transfer to CCU stepdown for dobutamine and dopamine drip. Holding diuretics at this point. 2. Atrial fibrillation new onset. Amiodarone orally, Toprol decreased dose twice a day, continue Eliquis 5 mg twice a day. 3. Bilateral lower extremity cellulitis -oral Keflex 4. Type 2 diabetes mellitus- on Lantus 20 units at night. Oral meds on hold at this point. 5. Obesity and sleep apnea 6. Acute on Chronic kidney disease stage 3. Watch with dobutamine and dopamine 7. Hyperlipidemia unspecified on atorvastatin 8. Hyponatremia watch closely with diuresis with dobutamine and dopamine   Code Status:     Code Status Orders        Start     Ordered   07/08/15 2228  Full code   Continuous     07/08/15 2228    Code Status History    Date Active Date Inactive Code Status Order ID Comments User Context   This patient has a current code status but no historical code status.     Disposition Plan:   Consultants:  Cardiology  Antibiotics:  Keflex  Time spent: 20 minutes  Loletha Grayer  [facility]

## 2015-07-13 NOTE — Evaluation (Signed)
Physical Therapy Evaluation Patient Details Name: [name] MRN: [id number] DOB: 04/24/1951 Today's Date: 07/13/2015   History of Present Illness  Patient is a 65 y/o male that presents with orthopnea, nocturnal dyspnea, and LE swelling determined to have acute on chronic CHF. He was found to be in a-fib on admission and was started on amioderone drip. Became hypotensive on amio drip, which was dc'd.   Clinical Impression  Patient reports no chest pain and is agreeable to session. He demonstrates mild LE functional weakness opting to use RW, however he does take several steps in the room without AD and no loss of balance. He does not become dyspnic or show any signs of fatigue with ambulation, no loss of balance concerns with RW. He does appear generally deconditioned as he reports an overall decrease in activity level recently, likely contributing to current hospitalization. Given the above patient would benefit from long term activity modifications, starting with HHPT and progressing to cardiac rehab. Skilled acute PT services are indicated to address his mobility deficits listed below.      Follow Up Recommendations Home health PT    Equipment Recommendations  Rolling walker with 5" wheels    Recommendations for Other Services       Precautions / Restrictions Precautions Precautions: Fall Restrictions Weight Bearing Restrictions: No      Mobility  Bed Mobility Overal bed mobility: Needs Assistance Bed Mobility: Supine to Sit     Supine to sit: Min guard     General bed mobility comments: Patient used bed rails with HOB elevated, this appears close to his baseline.  Transfers Overall transfer level: Modified independent Equipment used: Rolling walker (2 wheeled)             General transfer comment: No deficits identified in sit to stand with RW.   Ambulation/Gait Ambulation/Gait assistance: Modified independent (Device/Increase time) Ambulation Distance (Feet):  200 Feet Assistive device: Rolling walker (2 wheeled) Gait Pattern/deviations: WFL(Within Functional Limits)   Gait velocity interpretation: Below normal speed for age/gender General Gait Details: Decreased gait speed, otherwise no gait deficits. Patient takes several steps without RW in the session with no loss of balance.   Stairs            Wheelchair Mobility    Modified Rankin (Stroke Patients Only)       Balance Overall balance assessment: Needs assistance Sitting-balance support: No upper extremity supported Sitting balance-Leahy Scale: Good     Standing balance support: Bilateral upper extremity supported Standing balance-Leahy Scale: Good Standing balance comment: No balance deficits identified with RW                              Pertinent Vitals/Pain Pain Assessment:  (Patient does not complain of any pain in this session)    Home Living Family/patient expects to be discharged to:: Private residence Living Arrangements:  (With a roommate ) Available Help at Discharge: Friend(s) Type of Home: [place]: Walker - 2 wheels;Cane - single point      Prior Function Level of Independence: Independent         Comments: Patient reports he has decline in mobility by choice ("laziness") no falls recently and no use of AD.      Hand Dominance        Extremity/Trunk Assessment   Upper Extremity Assessment: Overall WFL for tasks assessed  Lower Extremity Assessment: Overall WFL for tasks assessed         Communication   Communication: No difficulties  Cognition Arousal/Alertness: Awake/alert Behavior During Therapy: WFL for tasks assessed/performed Overall Cognitive Status: Within Functional Limits for tasks assessed                      General Comments General comments (skin integrity, edema, etc.): Redness/swelling in bilateral LEs, MD aware.     Exercises         Assessment/Plan    PT Assessment Patient needs continued PT services  PT Diagnosis Difficulty walking;Generalized weakness   PT Problem List Decreased strength;Decreased knowledge of use of DME;Decreased activity tolerance;Decreased balance;Cardiopulmonary status limiting activity;Decreased mobility  PT Treatment Interventions DME instruction;Gait training;Stair training;Therapeutic activities;Therapeutic exercise;Balance training   PT Goals (Current goals can be found in the Care Plan section) Acute Rehab PT Goals Patient Stated Goal: To return home safely  PT Goal Formulation: With patient Time For Goal Achievement: 07/27/15 Potential to Achieve Goals: Good    Frequency Min 2X/week   Barriers to discharge        Co-evaluation               End of Session Equipment Utilized During Treatment: Gait belt Activity Tolerance: Patient tolerated treatment well Patient left: in chair;with call bell/phone within reach (Declined chair alarm) Nurse Communication: Mobility status         Time: [id number] PT Time Calculation (min) (ACUTE ONLY): 20 min   Charges:   PT Evaluation $PT Eval High Complexity: 1 Procedure     PT G Codes:       Kerman Passey, PT, DPT    07/13/2015, 5:22 PM

## 2015-07-13 NOTE — Progress Notes (Signed)
Per Dr. Rockey Situ he stated despite elevated creatinine of 2.13 pt may still have PICC line placed

## 2015-07-13 NOTE — Progress Notes (Signed)
Hospital Problem List     Principal Problem:   Acute on chronic combined systolic and diastolic CHF (congestive heart failure) (HCC) Active Problems:   Elevated troponin   Pulmonary hypertension (HCC)   Cardiorenal syndrome   Acute on chronic renal failure (HCC)   Morbid obesity due to excess calories (HCC)   Paroxysmal atrial fibrillation (HCC)   Orthostatic hypotension   Hyponatremia    Patient Profile:   65 y.o. male w/ PMH of CAD (s/p remote PCI and stenting 25 years ago), ischemic cardiomyopathy (EF 15-20% this admission), obesity, OSA, HTN, and DM admitted to [facility] on 07/08/2015 with acute on chronic combined systolic and diastolic CHF.  Subjective   Continues to have shortness of breath and lower extremity edema, unchanged since yesterday. Unsure of what his baseline weight is. Denies any chest pain or palpitations.   Inpatient Medications    . amiodarone  200 mg Oral BID  . apixaban  5 mg Oral BID  . aspirin  81 mg Oral Daily  . atorvastatin  40 mg Oral q1800  . cephALEXin  500 mg Oral 3 times per day  . insulin aspart  0-5 Units Subcutaneous QHS  . insulin aspart  0-5 Units Subcutaneous QHS  . insulin aspart  0-9 Units Subcutaneous TID WC  . insulin aspart  0-9 Units Subcutaneous TID WC  . insulin aspart  3 Units Subcutaneous TID WC  . insulin glargine  20 Units Subcutaneous QHS  . metoprolol succinate  25 mg Oral BID  . potassium chloride  10 mEq Oral Daily  . sodium chloride flush  3 mL Intravenous Q12H  . spironolactone  25 mg Oral QHS    Vital Signs    Filed Vitals:   07/12/15 2007 07/13/15 0500 07/13/15 0630 07/13/15 1100  BP: 101/72  103/82 100/80  Pulse: 86  87 80  Temp: 97.7 F (36.5 C)  97.5 F (36.4 C) 98.1 F (36.7 C)  TempSrc: Oral  Oral Oral  Resp: 16  22 20   Height:  6' (1.829 m)    Weight:  306 lb (138.801 kg)    SpO2: 90%  93% 94%    Intake/Output Summary (Last 24 hours) at 07/13/15 1352 Last data filed at 07/13/15 1108  Gross  per 24 hour  Intake    360 ml  Output    600 ml  Net   -240 ml   Filed Weights   07/11/15 0330 07/12/15 0523 07/13/15 0500  Weight: 299 lb 12.4 oz (135.978 kg) 298 lb 7 oz (135.37 kg) 306 lb (138.801 kg)    Physical Exam    General: Morbidly obese Caucasian male appearing in no acute distress. Head: Normocephalic, atraumatic.  Neck: Supple without bruits, JVD unable to be assessed secondary to body habitus. Lungs:  Resp regular and unlabored, decreased breath sounds at the bases bilaterally. Heart: RRR, S1, S2, no S3, S4, or murmur; no rub. Abdomen: Soft, non-tender,  with normoactive bowel sounds. No hepatomegaly. No rebound/guarding. No obvious abdominal masses. Appears distended. Extremities: No clubbing or cyanosis, 2+ edema bilaterally. Distal pedal pulses are 2+ bilaterally. Neuro: Alert and oriented X 3. Moves all extremities spontaneously. Psych: Normal affect.  Labs    CBC No results for input(s): WBC, NEUTROABS, HGB, HCT, MCV, PLT in the last 72 hours. Basic Metabolic Panel  Recent Labs  07/12/15 0524 07/13/15 0719  NA 133* 129*  K 4.1 4.0  CL 91* 87*  CO2 33* 30  GLUCOSE 249* 192*  BUN 40* 43*  CREATININE 2.02* 2.13*  CALCIUM 9.5 8.9     Telemetry    NSR with HR in 80's - 90's. No atopic events.  ECG    No new tracing.   Cardiac Studies and Radiology    Dg Chest 2 View: 07/08/2015  CLINICAL DATA:  Patient with a history of chf and cardiomyopathy. Patient states that since December he has developed shortness of breath, bilateral leg swelling and vertigo. Patient states that over the last week the symptoms have become worse. EXAM: CHEST  2 VIEW COMPARISON:  None. FINDINGS: Normal cardiac silhouette. There is central venous congestion. No effusion, infiltrate pneumothorax. No pleural fluid. No acute osseous abnormality. IMPRESSION: Central venous congestion.  No pleural floor no overt edema. Electronically Signed   By: Suzy Bouchard M.D.   On: 07/08/2015  20:43    Echocardiogram: 07/09/2015 Study Conclusions - Procedure narrative: Transthoracic echocardiography. Image quality was poor. The study was technically difficult, as a result of poor acoustic windows and poor sound wave transmission. - Left ventricle: The cavity size was moderately dilated. There was mild concentric hypertrophy. Systolic function was severely reduced. The estimated ejection fraction was in the range of 15% to 20%. Possible akinesis of the anteroseptal, anterior, and anterolateral myocardium. The study is not technically sufficient to allow evaluation of LV diastolic function. - Mitral valve: There was moderate regurgitation. - Left atrium: The atrium was moderately dilated. - Right ventricle: The cavity size was moderately dilated. Wall thickness was normal. Systolic function was moderately reduced. - Right atrium: The atrium was mildly dilated. - Pulmonary arteries: Systolic pressure was moderately increased. PA peak pressure: 50 mm Hg (S). - Inferior vena cava: The vessel was severely dilated. Respirophasic changes in dimension were absent, severely elevated central venous pressure.  Assessment & Plan    1. Acute on chronic combined systolic and diastolic heart failure - Echo this admission shows an EF of 15-20% with moderate mitral regurgitation and moderate pulmonary hypertension and severely dilated inferior vena cava.  - [name] not responding to IV diuresis, even torsemide with minimal urine output. Creatinine continues to worsen. - Discussed the patient's case with Dr. Ellyn Hack and Dr. Rockey Situ who are both in agreement to transfer to the ICU for Dopamine and Dobutamine Inotropic therapy. He will need a PICC line for this but the medications can be started IV until PICC can be placed. The plan would be to start these today and anticipate restarting IV diuretics tomorrow or the following day. Will hold his BB while receiving Inotropic  therapy. - Dr. Rockey Situ does not think he will tolerate Milrinone given his low blood pressure and renal dysfunction.  2. Newly diagnosed atrial fibrillation with rapid ventricular response - converted to NSR on 07/11/2015 - This patients CHA2DS2-VASc Score and unadjusted Ischemic Stroke Rate (% per year) is equal to 4.8 % stroke rate/year from a score of 4 (CHF, HTN, DM, Vascular). Continue Eliquis for anticoagulation.  - continue with PO Amiodarone 200 mg BID. BB on hold due to Inotropic therapy.  3. Coronary artery disease with previous PCI: - Given the decline in his ejection fraction (previously reported as 25% in 2013), will need cardiac catheterization once his volume status improves -- would do right and left heart cath. - Elevated Troponin: troponin values this admission ranged from 0.11 - 0.14.  4. Morbid obesity - BMI of 42.8 on admission. - Likely has sleep apnea as well, needs CPAP  5. Acute on chronic Stage 3  CKD - Minimal urine output given his dilated IVC, moderately elevated right heart pressures, and systolic CHF. Likely to represent cardiorenal syndrome. - creatinine 1.48 on admission. Trending up to 2.13 on 07/13/2015 even in the setting of holding diuretics. Urine output recorded as only 483mL on 07/12/2015. -- Hyponatremic - ? Likely related to Volume overload - hypoosmolar  6. Uncontrolled Type 2 DM - A1c 11.0 this admission - per admitting team  Signed, Erma Heritage , PA-C 1:52 PM 07/13/2015 Pager: [phone]   I saw evaluated the patient along with Mr. Samara Snide, [place]. As the patient is new to both of [place], we reviewed the chart together and a poor form the interview and exam together. We then discussed findings as well as our plan with Dr. Rockey Situ as well as Dr. Earleen Newport.    As he has had poor urine output with worsening renal function and very difficult to assess volume status besides clear edema, as per discussion with Dr. Rockey Situ last night, we will transfer to CCU  to initiate treatment with dobutamine and dopamine for inotropic effect. I would run the combination of inotropes overnight and then consider restarting diuretic tomorrow.  We will need to watch his A. fib rate closely while on inotropes. [name] currently on an increased dose of amiodaroneconverted from IV to oral. If he were to go fast overnight, I would go back to IV amiodarone.   Unfortunately, really really don't know his baseline. If he doesn't have good response with this current plan, we would need to consider right heart catheterization to get a better estimation of his filling pressures. Cardiac evaluation by echo would suggest severely elevated RV and RA pressures. This may be in part related to obesity hypoventilation syndrome.  If his renal function stabilizes, consider right and left heart catheterization prior to discharge. This would reevaluate both reduced ejection fraction and volume status/intracardiac pressures.   Leonie Man, M.D., M.S.  [employer]  [address] [place], [place] [address] [phone] Fax [phone]

## 2015-07-14 ENCOUNTER — Encounter: Payer: Self-pay | Admitting: Student

## 2015-07-14 LAB — BASIC METABOLIC PANEL
Anion gap: 10 (ref 5–15)
Anion gap: 6 (ref 5–15)
BUN: 50 mg/dL — ABNORMAL HIGH (ref 6–20)
BUN: 47 mg/dL — AB (ref 6–20)
CO2: 34 mmol/L — ABNORMAL HIGH (ref 22–32)
CO2: 35 mmol/L — ABNORMAL HIGH (ref 22–32)
Calcium: 9.1 mg/dL (ref 8.9–10.3)
Calcium: 9.1 mg/dL (ref 8.9–10.3)
Chloride: 91 mmol/L — ABNORMAL LOW (ref 101–111)
Chloride: 92 mmol/L — ABNORMAL LOW (ref 101–111)
Creatinine, Ser: 1.96 mg/dL — ABNORMAL HIGH (ref 0.61–1.24)
Creatinine, Ser: 1.87 mg/dL — ABNORMAL HIGH (ref 0.61–1.24)
GFR calc Af Amer: 40 mL/min — ABNORMAL LOW (ref >60)
GFR calc Af Amer: 42 mL/min — ABNORMAL LOW (ref >60)
GFR calc non Af Amer: 35 mL/min — ABNORMAL LOW (ref >60)
GFR, EST NON AFRICAN AMERICAN: 37 mL/min — AB (ref >60)
GLUCOSE: 171 mg/dL — AB (ref 65–99)
Glucose, Bld: 167 mg/dL — ABNORMAL HIGH (ref 65–99)
POTASSIUM: 3.9 mmol/L (ref 3.5–5.1)
Potassium: 3.5 mmol/L (ref 3.5–5.1)
Sodium: 135 mmol/L (ref 135–145)
Sodium: 133 mmol/L — ABNORMAL LOW (ref 135–145)

## 2015-07-14 LAB — GLUCOSE, CAPILLARY
GLUCOSE-CAPILLARY: 149 mg/dL — AB (ref 65–99)
GLUCOSE-CAPILLARY: 157 mg/dL — AB (ref 65–99)
GLUCOSE-CAPILLARY: 179 mg/dL — AB (ref 65–99)
Glucose-Capillary: 221 mg/dL — ABNORMAL HIGH (ref 65–99)

## 2015-07-14 LAB — CBC
HCT: 37 % — ABNORMAL LOW (ref 40.0–52.0)
Hemoglobin: 12.3 g/dL — ABNORMAL LOW (ref 13.0–18.0)
MCH: 32.5 pg (ref 26.0–34.0)
MCHC: 33.2 g/dL (ref 32.0–36.0)
MCV: 97.7 fL (ref 80.0–100.0)
Platelets: 144 10*3/uL — ABNORMAL LOW (ref 150–440)
RBC: 3.79 MIL/uL — ABNORMAL LOW (ref 4.40–5.90)
RDW: 14.6 % — ABNORMAL HIGH (ref 11.5–14.5)
WBC: 5.9 10*3/uL (ref 3.8–10.6)

## 2015-07-14 MED ORDER — FUROSEMIDE 10 MG/ML IJ SOLN
40.0000 mg | Freq: Two times a day (BID) | INTRAMUSCULAR | Status: DC
  Administered 2015-07-14 – 2015-07-16 (×4): 40 mg via INTRAVENOUS
  Filled 2015-07-14 (×4): qty 4

## 2015-07-14 MED ORDER — POTASSIUM CHLORIDE CRYS ER 20 MEQ PO TBCR
40.0000 meq | EXTENDED_RELEASE_TABLET | Freq: Every day | ORAL | Status: DC
  Administered 2015-07-15 – 2015-07-20 (×6): 40 meq via ORAL
  Filled 2015-07-14 (×6): qty 2

## 2015-07-14 MED ORDER — LORATADINE 10 MG PO TABS
10.0000 mg | ORAL_TABLET | Freq: Every day | ORAL | Status: DC
  Administered 2015-07-14 – 2015-07-31 (×18): 10 mg via ORAL
  Filled 2015-07-14 (×18): qty 1

## 2015-07-14 MED ORDER — DIPHENHYDRAMINE HCL 25 MG PO CAPS
25.0000 mg | ORAL_CAPSULE | Freq: Four times a day (QID) | ORAL | Status: DC | PRN
  Filled 2015-07-14: qty 1

## 2015-07-14 MED ORDER — POTASSIUM CHLORIDE CRYS ER 20 MEQ PO TBCR
40.0000 meq | EXTENDED_RELEASE_TABLET | Freq: Once | ORAL | Status: AC
  Administered 2015-07-14: 40 meq via ORAL
  Filled 2015-07-14: qty 2

## 2015-07-14 NOTE — Progress Notes (Signed)
Initial Nutrition Assessment     INTERVENTION:   Meals and Snacks: Cater to patient preferences within dietary restrictions; assisted pt with meal order for this evening as pt unhappy with food options Education: attempted diet education, pt reports he does not eat "salt" but also reports that when he leaves the hospital his first meal is going to be fried chicken and french fries. Pt not interested in further diet education at this time   NUTRITION DIAGNOSIS:   Limited adherence to nutrition-related recommendations related to chronic illness as evidenced by per patient/family report (pt with poor diet despite education, plans to eat fried chicken and french fries as first meal once he leaves hospital).  GOAL:   Patient will meet greater than or equal to 90% of their needs  MONITOR:    (Energy Intake, Anthropometrics, Electrolyte/Renal Profile, Digestive System)  REASON FOR ASSESSMENT:   Diagnosis    ASSESSMENT:    Pt admitted with acute on chronic CHF with EF of 15-20%, currently on dobutamine, new onset afib  Past Medical History  Diagnosis Date  . Chronic combined systolic (congestive) and diastolic (congestive) heart failure (HCC)     a. EF 25% by cath in 2013 b. Echo in 07/2015 showing EF of 15-20%, moderate MR, moderate Pulm HTN, severely dilated IVC  . Sleep apnea   . Diabetes mellitus type 2, uncontrolled ([place])     a. A1c 11.0 in 07/2015.  [name] Renal disorder     kidney stone  . Hypertension   . New onset atrial fibrillation ([place])     a. diagnosed in 07/2015 b. started on Eliquis  . CKD (chronic kidney disease) stage 3, GFR 30-59 ml/min      Diet Order:  Diet heart healthy/carb modified Room service appropriate?: Yes; Fluid consistency:: Thin   Energy Intake: pt with good appetite although not happy with diet or food. Recorded po intake mostly 100% of meals.   Electrolyte and Renal Profile:  Recent Labs Lab 07/09/15 0455  07/12/15 0524 07/13/15 0719  07/14/15 0510  BUN 31*  < > 40* 43* 50*  CREATININE 1.38*  < > 2.02* 2.13* 1.96*  NA 139  < > 133* 129* 135  K 4.1  < > 4.1 4.0 3.5  MG 2.3  --   --   --   --   < > = values in this interval not displayed. Glucose Profile:   Recent Labs  07/13/15 2129 07/14/15 0746 07/14/15 1149  GLUCAP 203* 149* 221*   Lab Results  Component Value Date   HGBA1C 11.0* 07/08/2015    Meds: ss novolog, dobutamine, novolog with meals, lantus, potassium chloride  Height:   Ht Readings from Last 1 Encounters:  07/13/15 6' (1.829 m)    Weight:   Wt Readings from Last 1 Encounters:  07/14/15 313 lb 0.9 oz (142 kg)   Filed Weights   07/12/15 0523 07/13/15 0500 07/14/15 0500  Weight: 298 lb 7 oz (135.37 kg) 306 lb (138.801 kg) 313 lb 0.9 oz (142 kg)    BMI:  Body mass index is 42.45 kg/(m^2).  LOW Care Level  Kerman Passey MS, [place], LDN [phone] Pager  [phone] Weekend/On-Call Pager

## 2015-07-14 NOTE — Progress Notes (Signed)
ANTICOAGULATION CONSULT NOTE - Follow up  Pharmacy Consult for Apixaban Indication: atrial fibrillation  No Known Allergies  Patient Measurements: Height: 6' (182.9 cm) Weight: (!) 313 lb 0.9 oz (142 kg) IBW/kg (Calculated) : 77.6   Vital Signs: Temp: 97.8 F (36.6 C) (02/09 0500) Temp Source: Axillary (02/09 0500) BP: 118/67 mmHg (02/09 0600) Pulse Rate: 91 (02/09 0600)  Labs:  Recent Labs  07/12/15 0524 07/13/15 0719 07/14/15 0510  HGB  --   --  12.3*  HCT  --   --  37.0*  PLT  --   --  144*  CREATININE 2.02* 2.13* 1.96*   Lab Results  Component Value Date   HGB 12.3* 07/14/2015   Lab Results  Component Value Date   PLT 144* 07/14/2015   Estimated Creatinine Clearance: 56.4 mL/min (by C-G formula based on Cr of 1.96).   Assessment: 65 yo male found to have new onset AFib starting on Eliquis.   2/4 Hgb 13.7, Plt 160  Goal of Therapy:  Monitor platelets by anticoagulation protocol: Yes   Plan:  Will continue apixaban 5 mg bid.   Ulice Dash D 07/14/2015,3:35 PM

## 2015-07-14 NOTE — Progress Notes (Signed)
Patient ID: [name], male   DOB: 11/06/1950, 65 y.o.   MRN: [id number]  [facility] PROGRESS NOTE  [name] [id number] DOB: Feb 22, 1951 DOA: 07/08/2015 PCP: No PCP Per Patient  HPI/Subjective: Patient is breathing better. Patient states he is urinating well. His nausea is gone. He feels okay. He has been sneezing and has a runny nose  Objective: Filed Vitals:   07/14/15 0500 07/14/15 0600  BP: 102/69 118/67  Pulse: 94 91  Temp: 97.8 F (36.6 C)   Resp: 10 15    Filed Weights   07/12/15 0523 07/13/15 0500 07/14/15 0500  Weight: 135.37 kg (298 lb 7 oz) 138.801 kg (306 lb) 142 kg (313 lb 0.9 oz)    ROS: Review of Systems  Constitutional: Negative for fever and chills.  HENT: Positive for congestion.   Eyes: Negative for blurred vision.  Respiratory: Positive for shortness of breath. Negative for cough.   Cardiovascular: Negative for chest pain.  Gastrointestinal: Negative for nausea, vomiting, abdominal pain, diarrhea and constipation.  Genitourinary: Negative for dysuria.  Musculoskeletal: Negative for joint pain.  Neurological: Negative for dizziness and headaches.   Exam: Physical Exam  Constitutional: [name] oriented to person, place, and time.  HENT:  Nose: No mucosal edema.  Mouth/Throat: No oropharyngeal exudate or posterior oropharyngeal edema.  Eyes: Conjunctivae, EOM and lids are normal. Pupils are equal, round, and reactive to light.  Neck: No JVD present. Carotid bruit is not present. No edema present. No thyroid mass and no thyromegaly present.  Cardiovascular: S1 normal and S2 normal.  An irregularly irregular rhythm present. Exam reveals no gallop.   Murmur heard.  Systolic murmur is present with a grade of 2/6  Pulses:      Dorsalis pedis pulses are 2+ on the right side, and 2+ on the left side.  Respiratory: No respiratory distress. He has no decreased breath sounds. He has no wheezes. He has no rhonchi. He has no rales.  GI: Soft.  Bowel sounds are normal. There is no tenderness.  Musculoskeletal:       Right ankle: He exhibits swelling.       Left ankle: He exhibits swelling.  Lymphadenopathy:    He has no cervical adenopathy.  Neurological: [name] alert and oriented to person, place, and time. No cranial nerve deficit.  Skin: Skin is warm. Nails show no clubbing.  Erythema bilateral lower extremities and feet. Bilateral lower extremities are normal or warm.  Psychiatric: He has a normal mood and affect.      Data Reviewed: Basic Metabolic Panel:  Recent Labs Lab 07/09/15 0455  07/11/15 0437 07/12/15 0524 07/13/15 0719 07/14/15 0510 07/14/15 1605  NA 139  < > 138 133* 129* 135 133*  K 4.1  < > 4.2 4.1 4.0 3.5 3.9  CL 99*  < > 94* 91* 87* 91* 92*  CO2 29  < > 33* 33* 30 34* 35*  GLUCOSE 214*  < > 149* 249* 192* 167* 171*  BUN 31*  < > 31* 40* 43* 50* 47*  CREATININE 1.38*  < > 1.76* 2.02* 2.13* 1.96* 1.87*  CALCIUM 9.5  < > 9.6 9.5 8.9 9.1 9.1  MG 2.3  --   --   --   --   --   --   < > = values in this interval not displayed. CBC:  Recent Labs Lab 07/08/15 2011 07/09/15 0455 07/14/15 0510  WBC 6.3 6.8 5.9  HGB 13.4 13.7 12.3*  HCT  41.2 41.5 37.0*  MCV 99.4 97.0 97.7  PLT 178 160 144*   Cardiac Enzymes:  Recent Labs Lab 07/08/15 2011 07/08/15 2327 07/09/15 0455 07/09/15 1055  TROPONINI 0.11* 0.12* 0.12* 0.14*   BNP (last 3 results)  Recent Labs  07/09/15 1048  BNP 1891.0*    CBG:  Recent Labs Lab 07/13/15 1536 07/13/15 2129 07/14/15 0746 07/14/15 1149 07/14/15 1557  GLUCAP 267* 203* 149* 221* 157*     Scheduled Meds: . amiodarone  200 mg Oral BID  . apixaban  5 mg Oral BID  . aspirin  81 mg Oral Daily  . atorvastatin  40 mg Oral q1800  . cephALEXin  500 mg Oral 3 times per day  . furosemide  40 mg Intravenous Q12H  . insulin aspart  0-5 Units Subcutaneous QHS  . insulin aspart  0-9 Units Subcutaneous TID WC  . insulin aspart  3 Units Subcutaneous TID WC  .  insulin glargine  20 Units Subcutaneous QHS  . [START ON 07/15/2015] potassium chloride  40 mEq Oral Daily  . sodium chloride flush  3 mL Intravenous Q12H  . spironolactone  25 mg Oral QHS    Assessment/Plan:  1. Acute systolic congestive heart failure with severe cardiomyopathy with an EF of 15-20% and anasarca.  Continue dobutamine drip. IV Lasix started today by cardiology 40 mg twice a day 2. Atrial fibrillation new onset. Amiodarone orally, Toprol held while on dobutamine, continue Eliquis 5 mg twice a day. 3. Bilateral lower extremity cellulitis -oral Keflex 4. Type 2 diabetes mellitus- on Lantus 20 units at night. Oral meds on hold at this point. 5. Obesity and sleep apnea 6. Acute on Chronic kidney disease stage 3. Watch with dobutamine and Lasix. Creatinine a little bit better today at 1.87 7. Hyperlipidemia unspecified on atorvastatin 8. Hyponatremia - watch closely with diuresis  9. elevated troponin demand ischemia from heart failure    Code Status:     Code Status Orders        Start     Ordered   07/08/15 2228  Full code   Continuous     07/08/15 2228    Code Status History    Date Active Date Inactive Code Status Order ID Comments User Context   This patient has a current code status but no historical code status.     Disposition Plan:   Consultants:  Cardiology  Antibiotics:  Keflex  Time spent: 20 minutes  Loletha Grayer  [facility]

## 2015-07-14 NOTE — Progress Notes (Signed)
Hospital Problem List     Principal Problem:   Acute on chronic combined systolic and diastolic CHF (congestive heart failure) (HCC) Active Problems:   Elevated troponin   Pulmonary hypertension (HCC)   Cardiorenal syndrome   Acute on chronic renal failure (HCC)   Morbid obesity due to excess calories (HCC)   Paroxysmal atrial fibrillation (HCC)   Orthostatic hypotension   Hyponatremia   Uncontrolled type 2 diabetes mellitus [facility])     Patient Profile:   65 y.o. male w/ PMH of CAD (s/p remote PCI and stenting 25 years ago), ischemic cardiomyopathy (EF 15-20% this admission), obesity, OSA, HTN, and DM admitted to [facility] on 07/08/2015 with acute on chronic combined systolic and diastolic CHF.  Subjective   Transferred to ICU yesterday to start Dobutamine and Dopamine in an effort to augment urine output. Currently on Dobutamine 5.6 mL/hr.  -1.3 L yesterday following the start of Inotropic therapy.  Denies any chest pain or palpitations. Breathing similar to previous day.  Inpatient Medications    . amiodarone  200 mg Oral BID  . apixaban  5 mg Oral BID  . aspirin  81 mg Oral Daily  . atorvastatin  40 mg Oral q1800  . cephALEXin  500 mg Oral 3 times per day  . insulin aspart  0-5 Units Subcutaneous QHS  . insulin aspart  0-9 Units Subcutaneous TID WC  . insulin aspart  3 Units Subcutaneous TID WC  . insulin glargine  20 Units Subcutaneous QHS  . potassium chloride  10 mEq Oral Daily  . sodium chloride flush  3 mL Intravenous Q12H  . spironolactone  25 mg Oral QHS    Vital Signs    Filed Vitals:   07/14/15 0300 07/14/15 0400 07/14/15 0500 07/14/15 0600  BP: 110/81 118/74 102/69 118/67  Pulse: 83 89 94 91  Temp:   97.8 F (36.6 C)   TempSrc:   Axillary   Resp: 21 20 10 15   Height:      Weight:   313 lb 0.9 oz (142 kg)   SpO2: 99% 100% 100% 92%    Intake/Output Summary (Last 24 hours) at 07/14/15 0750 Last data filed at 07/14/15 0548  Gross per 24 hour  Intake     600 ml  Output   1325 ml  Net   -725 ml   Filed Weights   07/12/15 0523 07/13/15 0500 07/14/15 0500  Weight: 298 lb 7 oz (135.37 kg) 306 lb (138.801 kg) 313 lb 0.9 oz (142 kg)    Physical Exam    General: Morbidly obese Caucasian male appearing in no acute distress. Head: Normocephalic, atraumatic. Neck: Supple without bruits, JVD unable to be assessed secondary to body habitus. Lungs: Resp regular and unlabored, decreased breath sounds at the bases bilaterally. Heart: RRR, S1, S2, no S3, S4, or murmur; no rub. Abdomen: Soft, non-tender, with normoactive bowel sounds. No hepatomegaly. No rebound/guarding. No obvious abdominal masses. Appears distended. Extremities: No clubbing or cyanosis, 2+ edema bilaterally. Erythema along lower extremities (chronic according to the patient). Distal pedal pulses are 2+ bilaterally. Neuro: Alert and oriented X 3. Moves all extremities spontaneously. Psych: Normal affect.  Labs    CBC  Recent Labs  07/14/15 0510  WBC 5.9  HGB 12.3*  HCT 37.0*  MCV 97.7  PLT 123456*   Basic Metabolic Panel  Recent Labs  07/13/15 0719 07/14/15 0510  NA 129* 135  K 4.0 3.5  CL 87* 91*  CO2 30 34*  GLUCOSE 192* 167*  BUN 43* 50*  CREATININE 2.13* 1.96*  CALCIUM 8.9 9.1    Telemetry    NSR, HR in the 90's. Episodes of 2-4 beats of NSVT at times. Occasional Ventricular Trigeminy.  ECG    No new tracing.   Cardiac Studies and Radiology    Dg Chest 2 View: 07/08/2015  CLINICAL DATA:  Patient with a history of chf and cardiomyopathy. Patient states that since December he has developed shortness of breath, bilateral leg swelling and vertigo. Patient states that over the last week the symptoms have become worse. EXAM: CHEST  2 VIEW COMPARISON:  None. FINDINGS: Normal cardiac silhouette. There is central venous congestion. No effusion, infiltrate pneumothorax. No pleural fluid. No acute osseous abnormality. IMPRESSION: Central venous congestion.   No pleural floor no overt edema. Electronically Signed   By: Suzy Bouchard M.D.   On: 07/08/2015 20:43   Dg Chest Port 1 View: 07/13/2015  CLINICAL DATA:  PICC line placement EXAM: PORTABLE CHEST 1 VIEW COMPARISON:  Chest x-ray dated 07/08/2015. FINDINGS: Interval placement of a right-sided PICC line with tip well-positioned in the lower SVC, near the expected location of the cavoatrial junction. Cardiomegaly is stable. Lungs are clear. No pleural effusion seen. No pneumothorax seen. IMPRESSION: 1. Right-sided PICC line placement, well positioned with tip near the expected location of the cavoatrial junction. 2. Stable cardiomegaly. Electronically Signed   By: Franki Cabot M.D.   On: 07/13/2015 18:19    Echocardiogram: 07/09/2015 Study Conclusions - Procedure narrative: Transthoracic echocardiography. Image quality was poor. The study was technically difficult, as a result of poor acoustic windows and poor sound wave transmission. - Left ventricle: The cavity size was moderately dilated. There was mild concentric hypertrophy. Systolic function was severely reduced. The estimated ejection fraction was in the range of 15% to 20%. Possible akinesis of the anteroseptal, anterior, and anterolateral myocardium. The study is not technically sufficient to allow evaluation of LV diastolic function. - Mitral valve: There was moderate regurgitation. - Left atrium: The atrium was moderately dilated. - Right ventricle: The cavity size was moderately dilated. Wall thickness was normal. Systolic function was moderately reduced. - Right atrium: The atrium was mildly dilated. - Pulmonary arteries: Systolic pressure was moderately increased. PA peak pressure: 50 mm Hg (S). - Inferior vena cava: The vessel was severely dilated. Respirophasic changes in dimension were absent, severely elevated central venous pressure.  Assessment & Plan    1. Acute on chronic combined systolic and  diastolic heart failure - Echo this admission shows an EF of 15-20% with moderate mitral regurgitation and moderate pulmonary hypertension and severely dilated inferior vena cava.  - The patient quit responding to IV diuresis, even torsemide with minimal urine output with his creatinine continuing to worsen. - Discussed the patient's case with Dr. Ellyn Hack and Dr. Rockey Situ on 07/13/2015 and the decision was made to transfer to the ICU for Dopamine and Dobutamine Inotropic therapy. BB on hold while on inotropes. - Plan was to start IV diuretics today. He diuresed -1.3L following the start of Dobutamine yesterday and his creatinine is trending down to 1.96 today. Consider checking a BMET later this afternoon prior to restarting IV diuretics. Would likely wait until tomorrow to allow more improvement in his creatinine and to see how he auto-diuresis.  2. Newly diagnosed atrial fibrillation with rapid ventricular response - converted to NSR on 07/11/2015 - This patients CHA2DS2-VASc Score and unadjusted Ischemic Stroke Rate (% per year) is equal to 4.8 %  stroke rate/year from a score of 4 (CHF, HTN, DM, Vascular). Continue Eliquis for anticoagulation.  - continue with PO Amiodarone 200 mg BID. BB on hold due to Inotropic therapy.  3. Coronary artery disease with previous PCI/ Elevated Troponin - Given the decline in his ejection fraction (previously reported as 25% in 2013), will need cardiac catheterization once his volume status improves -- would do right and left heart cath. -  troponin values this admission ranged from 0.11 - 0.14.  4. Morbid obesity - BMI of 42.8 on admission. - Likely has sleep apnea as well, needs CPAP  5. Acute on chronic Stage 3 CKD - Minimal urine output given his dilated IVC, moderately elevated right heart pressures, and systolic CHF. Likely to represent cardiorenal syndrome. - creatinine 1.48 on admission. Peaked at 2.13 on 07/13/2015. Trending down to 1.96 on 07/14/2015.  6.  Uncontrolled Type 2 DM - A1c 11.0 this admission - per admitting team  7. NSVT/ Ventricular Trigeminy - 2-4 beats of NSVT and occasional runs of ventricular trigeminy noted - BB on hold while receiving Inotropic therapy. Will resume afterwards - planning for LHC/RHC once renal function and volume status improves.   Signed, Erma Heritage , PA-C 7:50 AM 07/14/2015 Pager: [phone]

## 2015-07-14 NOTE — Progress Notes (Signed)
Pt alert and oriented, no complaints of pain.  NSR with BBB, a-fib at times, short runs of V-tach (4-5 beats) which pt has had history of this admission.  Placed on 2L [place] while sleeping.  Uses urinal.  VSS, afebrile.  Remains on dobutamine drip at 2.5 to keep map >65.  Urine output 650 ml so far this shift.

## 2015-07-14 NOTE — Progress Notes (Signed)
Inpatient Diabetes Program Recommendations  AACE/ADA: New Consensus Statement on Inpatient Glycemic Control (2015)  Target Ranges:  Prepandial:   less than 140 mg/dL      Peak postprandial:   less than 180 mg/dL (1-2 hours)      Critically ill patients:  140 - 180 mg/dL   Review of Glycemic Control  Results for DELORIS, POARCH (MRN [id number]) as of 07/14/2015 08:18  Ref. Range 07/13/2015 07:48 07/13/2015 11:45 07/13/2015 15:36 07/13/2015 21:29 07/14/2015 07:46  Glucose-Capillary Latest Ref Range: 65-99 mg/dL 193 (H) 290 (H) 267 (H) 203 (H) 149 (H)    Current orders for Inpatient glycemic control: Novolog sensitive 0-9units tid with meals and HS, Lantus 20 units daily, Novolog 3 units tid with meals  Inpatient Diabetes Program Recommendations:    May consider increasing Novolog meal coverage 5 units tid with meals (while in the hospital)- post prandial blood sugars remain elevated despite current Novolog ordered. No insurance on file. May need less expensive insulin at discharge such as NPH or Novolin 70/30, due to delay of insurance coverage til August 03, 2015.   Gentry Fitz, RN, BA, MHA, CDE Diabetes Coordinator Inpatient Diabetes Program  [phone] (Team Pager) [phone] ([place]) 07/14/2015 7:54 AM

## 2015-07-15 LAB — CBC
HEMATOCRIT: 36.9 % — AB (ref 40.0–52.0)
Hemoglobin: 12.1 g/dL — ABNORMAL LOW (ref 13.0–18.0)
MCH: 32 pg (ref 26.0–34.0)
MCHC: 32.9 g/dL (ref 32.0–36.0)
MCV: 97.1 fL (ref 80.0–100.0)
Platelets: 140 10*3/uL — ABNORMAL LOW (ref 150–440)
RBC: 3.8 MIL/uL — AB (ref 4.40–5.90)
RDW: 14.4 % (ref 11.5–14.5)
WBC: 5.4 10*3/uL (ref 3.8–10.6)

## 2015-07-15 LAB — BASIC METABOLIC PANEL
ANION GAP: 7 (ref 5–15)
BUN: 44 mg/dL — AB (ref 6–20)
CHLORIDE: 95 mmol/L — AB (ref 101–111)
CO2: 34 mmol/L — ABNORMAL HIGH (ref 22–32)
Calcium: 9.3 mg/dL (ref 8.9–10.3)
Creatinine, Ser: 1.63 mg/dL — ABNORMAL HIGH (ref 0.61–1.24)
GFR, EST AFRICAN AMERICAN: 50 mL/min — AB (ref >60)
GFR, EST NON AFRICAN AMERICAN: 43 mL/min — AB (ref >60)
Glucose, Bld: 169 mg/dL — ABNORMAL HIGH (ref 65–99)
POTASSIUM: 4.3 mmol/L (ref 3.5–5.1)
SODIUM: 136 mmol/L (ref 135–145)

## 2015-07-15 LAB — GLUCOSE, CAPILLARY
GLUCOSE-CAPILLARY: 273 mg/dL — AB (ref 65–99)
GLUCOSE-CAPILLARY: 153 mg/dL — AB (ref 65–99)
GLUCOSE-CAPILLARY: 203 mg/dL — AB (ref 65–99)
Glucose-Capillary: 139 mg/dL — ABNORMAL HIGH (ref 65–99)

## 2015-07-15 NOTE — Progress Notes (Signed)
ANTICOAGULATION CONSULT NOTE - Follow up  Pharmacy Consult for Apixaban Indication: atrial fibrillation  No Known Allergies  Patient Measurements: Height: 6' (182.9 cm) Weight: (!) 306 lb 7 oz (139 kg) IBW/kg (Calculated) : 77.6   Vital Signs: Temp: 97.6 F (36.4 C) (02/10 0424) Temp Source: Oral (02/10 0424) BP: 110/81 mmHg (02/10 0600) Pulse Rate: 104 (02/10 0600)  Labs:  Recent Labs  07/14/15 0510 07/14/15 1605 07/15/15 0428  HGB 12.3*  --  12.1*  HCT 37.0*  --  36.9*  PLT 144*  --  140*  CREATININE 1.96* 1.87* 1.63*   Lab Results  Component Value Date   HGB 12.1* 07/15/2015   Lab Results  Component Value Date   PLT 140* 07/15/2015   Estimated Creatinine Clearance: 67.1 mL/min (by C-G formula based on Cr of 1.63).   Assessment: Pharmacy consulted to dose and monitor apixiban for 65 yo male with new onset AFib.   Goal of Therapy:  Monitor platelets by anticoagulation protocol: Yes   Plan:  Will continue apixaban 5 mg bid.   Simpson,Michael L 07/15/2015,12:34 PM

## 2015-07-15 NOTE — Progress Notes (Signed)
[facility] [place] NAME: [name]    MR#:  [id number]  DATE OF BIRTH:  06/07/1950  SUBJECTIVE:   Patient is here due to acute on chronic systolic CHF. Shortness of breath is improved. [name] diuresing well with IV Lasix. Her means of a dobutamine drip. Renal function is improving.  REVIEW OF SYSTEMS:    Review of Systems  Constitutional: Negative for fever and chills.  HENT: Negative for congestion and tinnitus.   Eyes: Negative for blurred vision and double vision.  Respiratory: Negative for cough, shortness of breath and wheezing.   Cardiovascular: Negative for chest pain, orthopnea and PND.  Gastrointestinal: Negative for nausea, vomiting, abdominal pain and diarrhea.  Genitourinary: Negative for dysuria and hematuria.  Neurological: Negative for dizziness, sensory change and focal weakness.  All other systems reviewed and are negative.   Nutrition: Heart healthy Tolerating Diet: yes Tolerating PT: Await Eval.    DRUG ALLERGIES:  No Known Allergies  VITALS:  Blood pressure 87/67, pulse 112, temperature 98.6 F (37 C), temperature source Oral, resp. rate 18, height 6' (1.829 m), weight 139 kg (306 lb 7 oz), SpO2 95 %.  PHYSICAL EXAMINATION:   Physical Exam  GENERAL:  65 y.o.-year-old obese patient lying in the bed in no acute distress.  EYES: Pupils equal, round, reactive to light and accommodation. No scleral icterus. Extraocular muscles intact.  HEENT: Head atraumatic, normocephalic. Oropharynx and nasopharynx clear.  NECK:  Supple, no jugular venous distention. No thyroid enlargement, no tenderness.  LUNGS: Normal breath sounds bilaterally, no wheezing, rales, rhonchi. No use of accessory muscles of respiration.  CARDIOVASCULAR: S1, S2 normal. No murmurs, rubs, or gallops.  ABDOMEN: Soft, nontender, nondistended. Bowel sounds present. No organomegaly or mass.  EXTREMITIES: No cyanosis, clubbing, + 1-2 edema  b/l.     NEUROLOGIC: Cranial nerves II through XII are intact. No focal Motor or sensory deficits b/l.   PSYCHIATRIC: The patient is alert and oriented x 3.  SKIN: No obvious rash, lesion, or ulcer.    LABORATORY PANEL:   CBC  Recent Labs Lab 07/15/15 0428  WBC 5.4  HGB 12.1*  HCT 36.9*  PLT 140*   ------------------------------------------------------------------------------------------------------------------  Chemistries   Recent Labs Lab 07/09/15 0455  07/15/15 0428  NA 139  < > 136  K 4.1  < > 4.3  CL 99*  < > 95*  CO2 29  < > 34*  GLUCOSE 214*  < > 169*  BUN 31*  < > 44*  CREATININE 1.38*  < > 1.63*  CALCIUM 9.5  < > 9.3  MG 2.3  --   --   < > = values in this interval not displayed. ------------------------------------------------------------------------------------------------------------------  Cardiac Enzymes  Recent Labs Lab 07/09/15 1055  TROPONINI 0.14*   ------------------------------------------------------------------------------------------------------------------  RADIOLOGY:  Dg Chest Port 1 View  07/13/2015  CLINICAL DATA:  PICC line placement EXAM: PORTABLE CHEST 1 VIEW COMPARISON:  Chest x-ray dated 07/08/2015. FINDINGS: Interval placement of a right-sided PICC line with tip well-positioned in the lower SVC, near the expected location of the cavoatrial junction. Cardiomegaly is stable. Lungs are clear. No pleural effusion seen. No pneumothorax seen. IMPRESSION: 1. Right-sided PICC line placement, well positioned with tip near the expected location of the cavoatrial junction. 2. Stable cardiomegaly. Electronically Signed   By: Franki Cabot M.D.   On: 07/13/2015 18:19     ASSESSMENT AND PLAN:   65 year old male with past medical history of  chronic systolic CHF, obstructive sleep apnea, diabetes type 2 that, patient, chronic kidney disease stage III, hypertension, atrial fibrillation who presented to the hospital shortness of breath and noted  to be in congestive heart failure.  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 -Continue diuresis with IV Lasix and continue dobutamine. Appreciate cardiology input and will likely need a few more days of IV diuresis.  #2 chronic atrial fibrillation-continue amiodarone, rate controlled. -Continue Eliquis  #3. Bilateral lower extremity cellulitis-continue Keflex.  #4 type 2 diabetes without complication-continue Lantus, NovoLog with meals -Follow blood sugars.  #5. HyperLipidemia-continue atorvastatin.  #6. Anxiety - cont. Xanax.     All the records are reviewed and case discussed with Care Management/Social Workerr. Management plans discussed with the patient, family and they are in agreement.  CODE STATUS: Full   DVT Prophylaxis: Eliquis  TOTAL TIME TAKING CARE OF THIS PATIENT: 30 minutes.   POSSIBLE D/C IN 2-3 DAYS, DEPENDING ON CLINICAL CONDITION.   [name] M.D on 07/15/2015 at 5:27 PM  Between 7am to 6pm - Pager - [phone]  After 6pm go to [email or web address] - password [email or web address] [facility]  [place] [facility]  Office  [phone]  CC: Primary care physician; No PCP Per Patient

## 2015-07-15 NOTE — Progress Notes (Signed)
Inpatient Diabetes Program Recommendations  AACE/ADA: New Consensus Statement on Inpatient Glycemic Control (2015)  Target Ranges:  Prepandial:   less than 140 mg/dL      Peak postprandial:   less than 180 mg/dL (1-2 hours)      Critically ill patients:  140 - 180 mg/dL   Review of Glycemic Control  Results for EKAM, DERSTINE (MRN [id number]) as of 07/15/2015 13:04  Ref. Range 07/14/2015 11:49 07/14/2015 15:57 07/14/2015 22:32 07/15/2015 07:45 07/15/2015 11:24  Glucose-Capillary Latest Ref Range: 65-99 mg/dL 221 (H) 157 (H) 179 (H) 139 (H) 273 (H)    Current orders for Inpatient glycemic control: Novolog sensitive 0-9units tid with meals and HS, Lantus 20 units daily, Novolog 3 units tid with meals  Post prandial blood sugars remain elevated  Inpatient Diabetes Program Recommendations:   Consider increasing Novolog meal coverage to  5 units tid with meals (while in the hospital)- post prandial blood sugars remain elevated despite current Novolog ordered. No insurance on file. May need less expensive insulin at discharge such as NPH or Novolin 70/30, due to delay of insurance coverage til August 03, 2015.  Gentry Fitz, RN, BA, MHA, CDE Diabetes Coordinator Inpatient Diabetes Program  [phone] (Team Pager) [phone] ([place]) 07/15/2015 1:05 PM

## 2015-07-15 NOTE — Progress Notes (Signed)
Hospital Problem List     Principal Problem:   Acute on chronic combined systolic and diastolic CHF (congestive heart failure) (HCC) Active Problems:   Elevated troponin   Pulmonary hypertension (HCC)   Cardiorenal syndrome   Acute on chronic renal failure (HCC)   Morbid obesity due to excess calories (HCC)   Paroxysmal atrial fibrillation (HCC)   Orthostatic hypotension   Hyponatremia   Uncontrolled type 2 diabetes mellitus [facility])     Patient Profile:   65 y.o. male w/ PMH of CAD (s/p remote PCI and stenting 25 years ago), ischemic cardiomyopathy (EF 15-20% this admission), obesity, OSA, HTN, and DM admitted to [facility] on 07/08/2015 with acute on chronic combined systolic and diastolic CHF.  Subjective   [name] feeling better today. Urine output has improved and renal function also improved.  Inpatient Medications    . amiodarone  200 mg Oral BID  . apixaban  5 mg Oral BID  . aspirin  81 mg Oral Daily  . atorvastatin  40 mg Oral q1800  . cephALEXin  500 mg Oral 3 times per day  . furosemide  40 mg Intravenous Q12H  . insulin aspart  0-5 Units Subcutaneous QHS  . insulin aspart  0-9 Units Subcutaneous TID WC  . insulin aspart  3 Units Subcutaneous TID WC  . insulin glargine  20 Units Subcutaneous QHS  . loratadine  10 mg Oral QHS  . potassium chloride  40 mEq Oral Daily  . sodium chloride flush  3 mL Intravenous Q12H  . spironolactone  25 mg Oral QHS    Vital Signs    Filed Vitals:   07/15/15 0424 07/15/15 0436 07/15/15 0500 07/15/15 0600  BP: 115/73  121/77 110/81  Pulse: 110  107 104  Temp: 97.6 F (36.4 C)     TempSrc: Oral     Resp: 17  16 17   Height:      Weight:  306 lb 7 oz (139 kg)    SpO2: 100%  100% 100%    Intake/Output Summary (Last 24 hours) at 07/15/15 1336 Last data filed at 07/15/15 1100  Gross per 24 hour  Intake 1055.2 ml  Output   2300 ml  Net -1244.8 ml   Filed Weights   07/13/15 0500 07/14/15 0500 07/15/15 0436  Weight: 306 lb  (138.801 kg) 313 lb 0.9 oz (142 kg) 306 lb 7 oz (139 kg)    Physical Exam    General: Morbidly obese Caucasian male appearing in no acute distress. Head: Normocephalic, atraumatic. Neck: Supple without bruits, JVD unable to be assessed secondary to body habitus. Lungs: Resp regular and unlabored, decreased breath sounds at the bases bilaterally. Heart: RRR, S1, S2, no S3, S4, or murmur; no rub. Abdomen: Soft, non-tender, with normoactive bowel sounds. No hepatomegaly. No rebound/guarding. No obvious abdominal masses. Appears distended. Extremities: No clubbing or cyanosis, 2+ edema bilaterally. Erythema along lower extremities (chronic according to the patient). Distal pedal pulses are 2+ bilaterally. Neuro: Alert and oriented X 3. Moves all extremities spontaneously. Psych: Normal affect.  Labs    CBC  Recent Labs  07/14/15 0510 07/15/15 0428  WBC 5.9 5.4  HGB 12.3* 12.1*  HCT 37.0* 36.9*  MCV 97.7 97.1  PLT 144* [id number]*   Basic Metabolic Panel  Recent Labs  07/14/15 1605 07/15/15 0428  NA 133* 136  K 3.9 4.3  CL 92* 95*  CO2 35* 34*  GLUCOSE 171* 169*  BUN 47* 44*  CREATININE 1.87*  1.63*  CALCIUM 9.1 9.3    Telemetry    NSR, HR in the 90's. Episodes of 2-4 beats of NSVT at times. Occasional Ventricular Trigeminy.  ECG    No new tracing.   Cardiac Studies and Radiology    Dg Chest 2 View: 07/08/2015  CLINICAL DATA:  Patient with a history of chf and cardiomyopathy. Patient states that since December he has developed shortness of breath, bilateral leg swelling and vertigo. Patient states that over the last week the symptoms have become worse. EXAM: CHEST  2 VIEW COMPARISON:  None. FINDINGS: Normal cardiac silhouette. There is central venous congestion. No effusion, infiltrate pneumothorax. No pleural fluid. No acute osseous abnormality. IMPRESSION: Central venous congestion.  No pleural floor no overt edema. Electronically Signed   By: Suzy Bouchard M.D.    On: 07/08/2015 20:43   Dg Chest Port 1 View: 07/13/2015  CLINICAL DATA:  PICC line placement EXAM: PORTABLE CHEST 1 VIEW COMPARISON:  Chest x-ray dated 07/08/2015. FINDINGS: Interval placement of a right-sided PICC line with tip well-positioned in the lower SVC, near the expected location of the cavoatrial junction. Cardiomegaly is stable. Lungs are clear. No pleural effusion seen. No pneumothorax seen. IMPRESSION: 1. Right-sided PICC line placement, well positioned with tip near the expected location of the cavoatrial junction. 2. Stable cardiomegaly. Electronically Signed   By: Franki Cabot M.D.   On: 07/13/2015 18:19    Echocardiogram: 07/09/2015 Study Conclusions - Procedure narrative: Transthoracic echocardiography. Image quality was poor. The study was technically difficult, as a result of poor acoustic windows and poor sound wave transmission. - Left ventricle: The cavity size was moderately dilated. There was mild concentric hypertrophy. Systolic function was severely reduced. The estimated ejection fraction was in the range of 15% to 20%. Possible akinesis of the anteroseptal, anterior, and anterolateral myocardium. The study is not technically sufficient to allow evaluation of LV diastolic function. - Mitral valve: There was moderate regurgitation. - Left atrium: The atrium was moderately dilated. - Right ventricle: The cavity size was moderately dilated. Wall thickness was normal. Systolic function was moderately reduced. - Right atrium: The atrium was mildly dilated. - Pulmonary arteries: Systolic pressure was moderately increased. PA peak pressure: 50 mm Hg (S). - Inferior vena cava: The vessel was severely dilated. Respirophasic changes in dimension were absent, severely elevated central venous pressure.  Assessment & Plan    1. Acute on chronic combined systolic and diastolic heart failure - Echo this admission shows an EF of 15-20% with moderate mitral  regurgitation and moderate pulmonary hypertension and severely dilated inferior vena cava.  - The patient is improving with low dose dobutamine. Furosemide was resumed yesterday with good urine output. His weight is down to 306. [name] still moderately fluid overloaded and I recommend continued dobutamine at least until tomorrow or Sunday. Continue diuresis at the current dose and continue to monitor renal function. If renal function continues to improve, we can consider resuming small dose lisinopril or ARB and resuming small dose carvedilol once he is off dobutamine.   2. Newly diagnosed atrial fibrillation with rapid ventricular response - converted to NSR on 07/11/2015 - This patients CHA2DS2-VASc Score and unadjusted Ischemic Stroke Rate (% per year) is equal to 4.8 % stroke rate/year from a score of 4 (CHF, HTN, DM, Vascular). Continue Eliquis for anticoagulation.  - continue with PO Amiodarone 200 mg BID. BB on hold due to Inotropic therapy.  3. Coronary artery disease with previous PCI/ Elevated Troponin - Given  the decline in his ejection fraction (previously reported as 25% in 2013), will need right and left cardiac catheterization once his volume status improves --  likely as an outpatient -  troponin values this admission ranged from 0.11 - 0.14.  4. Morbid obesity - BMI of 42.8 on admission. - Likely has sleep apnea as well, needs CPAP  5. Acute on chronic Stage 3 CKD - Minimal urine output given his dilated IVC, moderately elevated right heart pressures, and systolic CHF. Likely to represent cardiorenal syndrome. - creatinine 1.48 on admission. Peaked at 2.13 on 07/13/2015. Trending down to 1.96 on 07/14/2015.  6. Uncontrolled Type 2 DM - A1c 11.0 this admission - per admitting team    Signed, Kathlyn Sacramento , MD  1:36 PM 07/15/2015

## 2015-07-15 NOTE — Progress Notes (Signed)
Physical Therapy Treatment Patient Details Name: [name] MRN: [id number] DOB: 1950-11-24 Today's Date: 07/15/2015    History of Present Illness Patient is a 65 y/o male that presents with orthopnea, nocturnal dyspnea, and LE swelling determined to have acute on chronic CHF. He was found to be in a-fib on admission and was started on amioderone drip. Became hypotensive on amio drip, which was dc'd.     PT Comments    Per chart review, patient noted with transfer to CCU for dobutamine drip.  Per discussion with RN, patient stable and appropriate for continued PT; to enter/modify order as appropriate to allow for continued treatment. Patient continues to complete all mobility at sup/mod indep level at this time; no significant change in functional status noted.  Vitals stable and WFL throughout session with minimal reports of fatigue or SOB. Initial goals remain appropriate; recommend continued mobilization with nursing outside of therapy to maintain functional strength/endurance.  May transition to no PT needs upon discharge.   Follow Up Recommendations  Other (comment);Home health PT (outpatient cardiac rehab)     Equipment Recommendations  Rolling walker with 5" wheels    Recommendations for Other Services       Precautions / Restrictions Precautions Precautions: Fall Restrictions Weight Bearing Restrictions: No    Mobility  Bed Mobility Overal bed mobility: Modified Independent Bed Mobility: Supine to Sit;Sit to Supine           General bed mobility comments: use of momentum to complete movement transition  Transfers Overall transfer level: Modified independent Equipment used: Rolling walker (2 wheeled)             General transfer comment: Sit/stand with RW, mod indep; good LE strength and power noted with movement transition  Ambulation/Gait Ambulation/Gait assistance: Supervision;Modified independent (Device/Increase time) Ambulation Distance (Feet): 150  Feet Assistive device: Rolling walker (2 wheeled)       General Gait Details: reciprocal stepping with fair cadence and overall gait speed; no buckling or LOB.  Vitals stable and WFL throughout without reports of chest pain/pressure or excessive SOB   Stairs            Wheelchair Mobility    Modified Rankin (Stroke Patients Only)       Balance Overall balance assessment: Needs assistance Sitting-balance support: No upper extremity supported;Feet supported Sitting balance-Leahy Scale: Normal     Standing balance support: Bilateral upper extremity supported Standing balance-Leahy Scale: Good                      Cognition Arousal/Alertness: Awake/alert Behavior During Therapy: WFL for tasks assessed/performed Overall Cognitive Status: Within Functional Limits for tasks assessed                      Exercises Other Exercises Other Exercises: Seated LE therex, 1x15, AROM for muscular strength/endurance with functional activities    General Comments        Pertinent Vitals/Pain Pain Assessment: No/denies pain    Home Living                      Prior Function            PT Goals (current goals can now be found in the care plan section) Acute Rehab PT Goals Patient Stated Goal: To return home safely  PT Goal Formulation: With patient Time For Goal Achievement: 07/27/15 Potential to Achieve Goals: Good Progress towards PT goals: Progressing toward goals  Frequency  Min 2X/week    PT Plan Current plan remains appropriate    Co-evaluation             End of Session   Activity Tolerance: Patient tolerated treatment well Patient left: in bed;with call bell/phone within reach     Time: 1122-1145 PT Time Calculation (min) (ACUTE ONLY): 23 min  Charges:  $Gait Training: 8-22 mins $Therapeutic Exercise: 8-22 mins                    G Codes:      Takoya Jonas H. Owens Shark, PT, DPT, NCS 07/15/2015, 2:50  PM [phone]

## 2015-07-16 ENCOUNTER — Inpatient Hospital Stay: Payer: Medicaid Other

## 2015-07-16 DIAGNOSIS — E1165 Type 2 diabetes mellitus with hyperglycemia: Secondary | ICD-10-CM

## 2015-07-16 DIAGNOSIS — E1159 Type 2 diabetes mellitus with other circulatory complications: Secondary | ICD-10-CM

## 2015-07-16 LAB — BASIC METABOLIC PANEL
Anion gap: 7 (ref 5–15)
BUN: 36 mg/dL — AB (ref 6–20)
CO2: 34 mmol/L — ABNORMAL HIGH (ref 22–32)
Calcium: 8.9 mg/dL (ref 8.9–10.3)
Chloride: 94 mmol/L — ABNORMAL LOW (ref 101–111)
Creatinine, Ser: 1.61 mg/dL — ABNORMAL HIGH (ref 0.61–1.24)
GFR calc Af Amer: 51 mL/min — ABNORMAL LOW (ref >60)
GFR, EST NON AFRICAN AMERICAN: 44 mL/min — AB (ref >60)
GLUCOSE: 109 mg/dL — AB (ref 65–99)
POTASSIUM: 4 mmol/L (ref 3.5–5.1)
Sodium: 135 mmol/L (ref 135–145)

## 2015-07-16 LAB — GLUCOSE, CAPILLARY
Glucose-Capillary: 89 mg/dL (ref 65–99)
Glucose-Capillary: 191 mg/dL — ABNORMAL HIGH (ref 65–99)
Glucose-Capillary: 174 mg/dL — ABNORMAL HIGH (ref 65–99)
Glucose-Capillary: 228 mg/dL — ABNORMAL HIGH (ref 65–99)

## 2015-07-16 IMAGING — CR DG CHEST 1V PORT
1 series · 3 of 3 positions shown · non-contrast
Comparison: Chest radiograph performed [DATE]

CLINICAL DATA: Check central line position.  Initial encounter.

EXAM:
PORTABLE CHEST 1 VIEW

[Series 1: ap · 0.17mm/px · 3 of 3 slices shown]
[im 1/3]
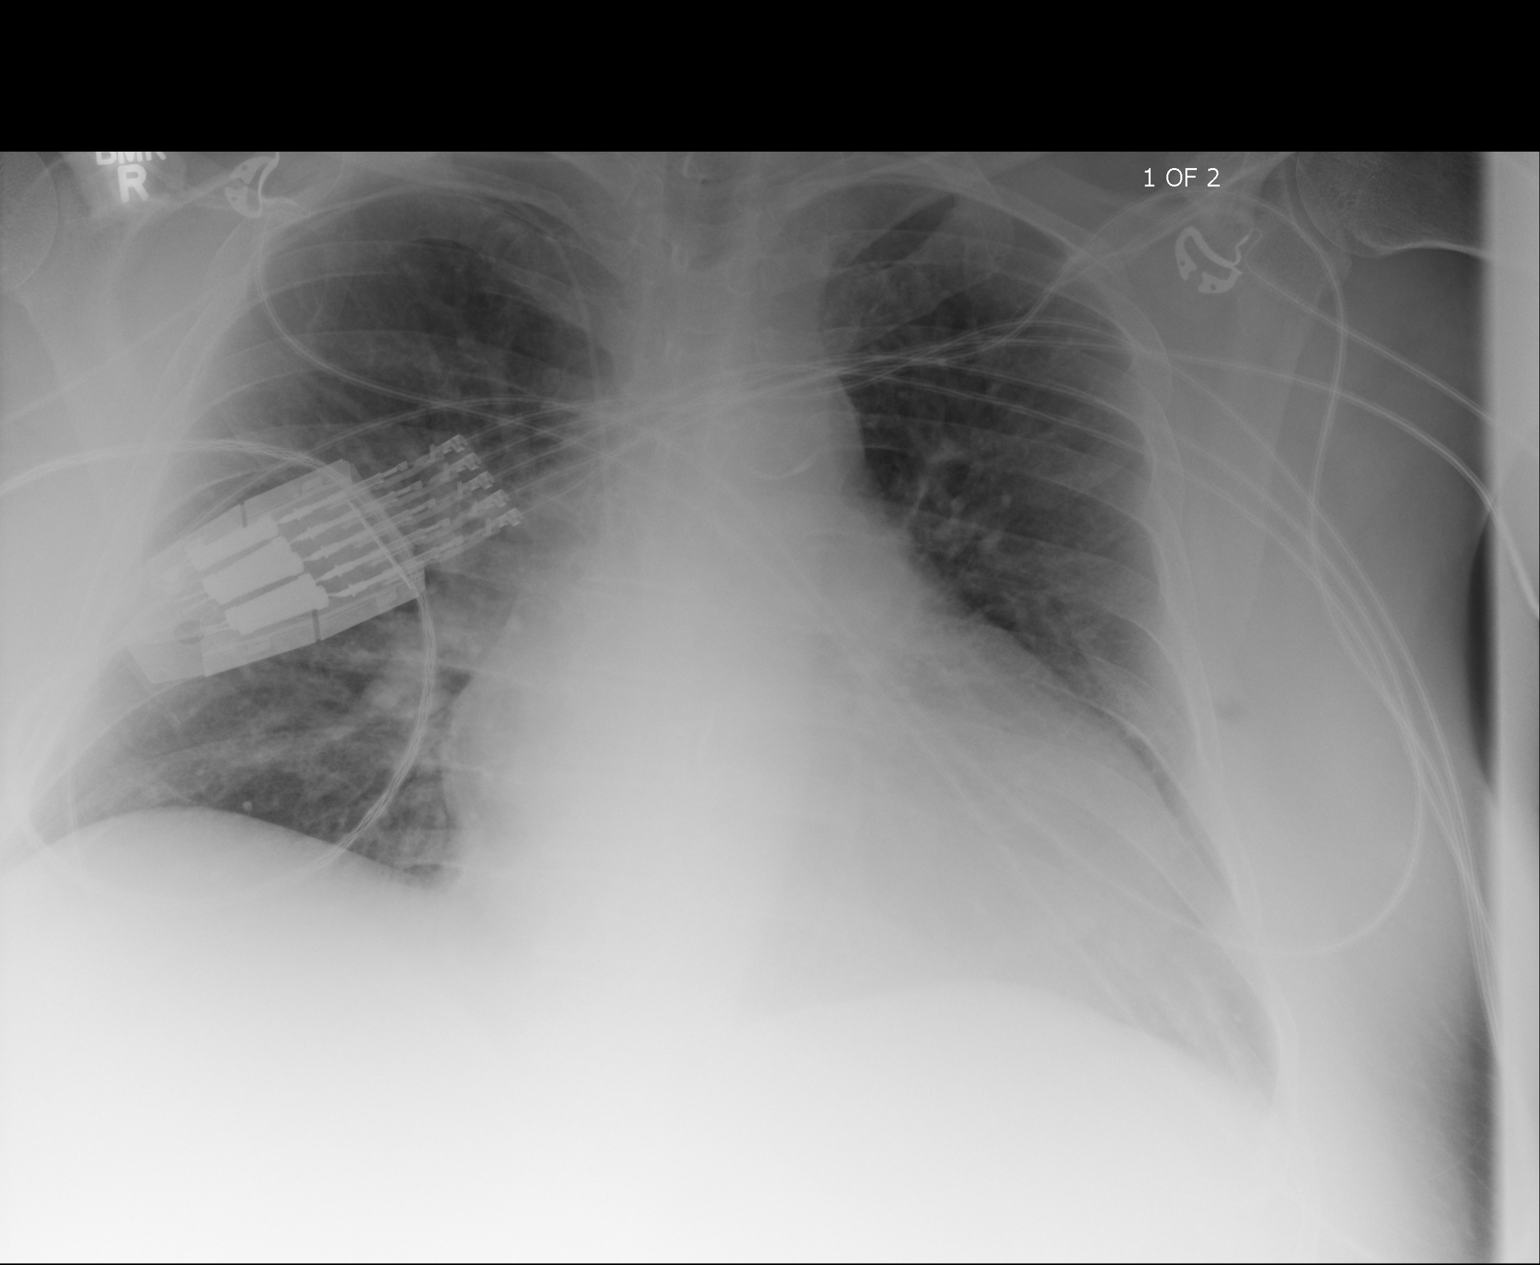
[im 2/3]
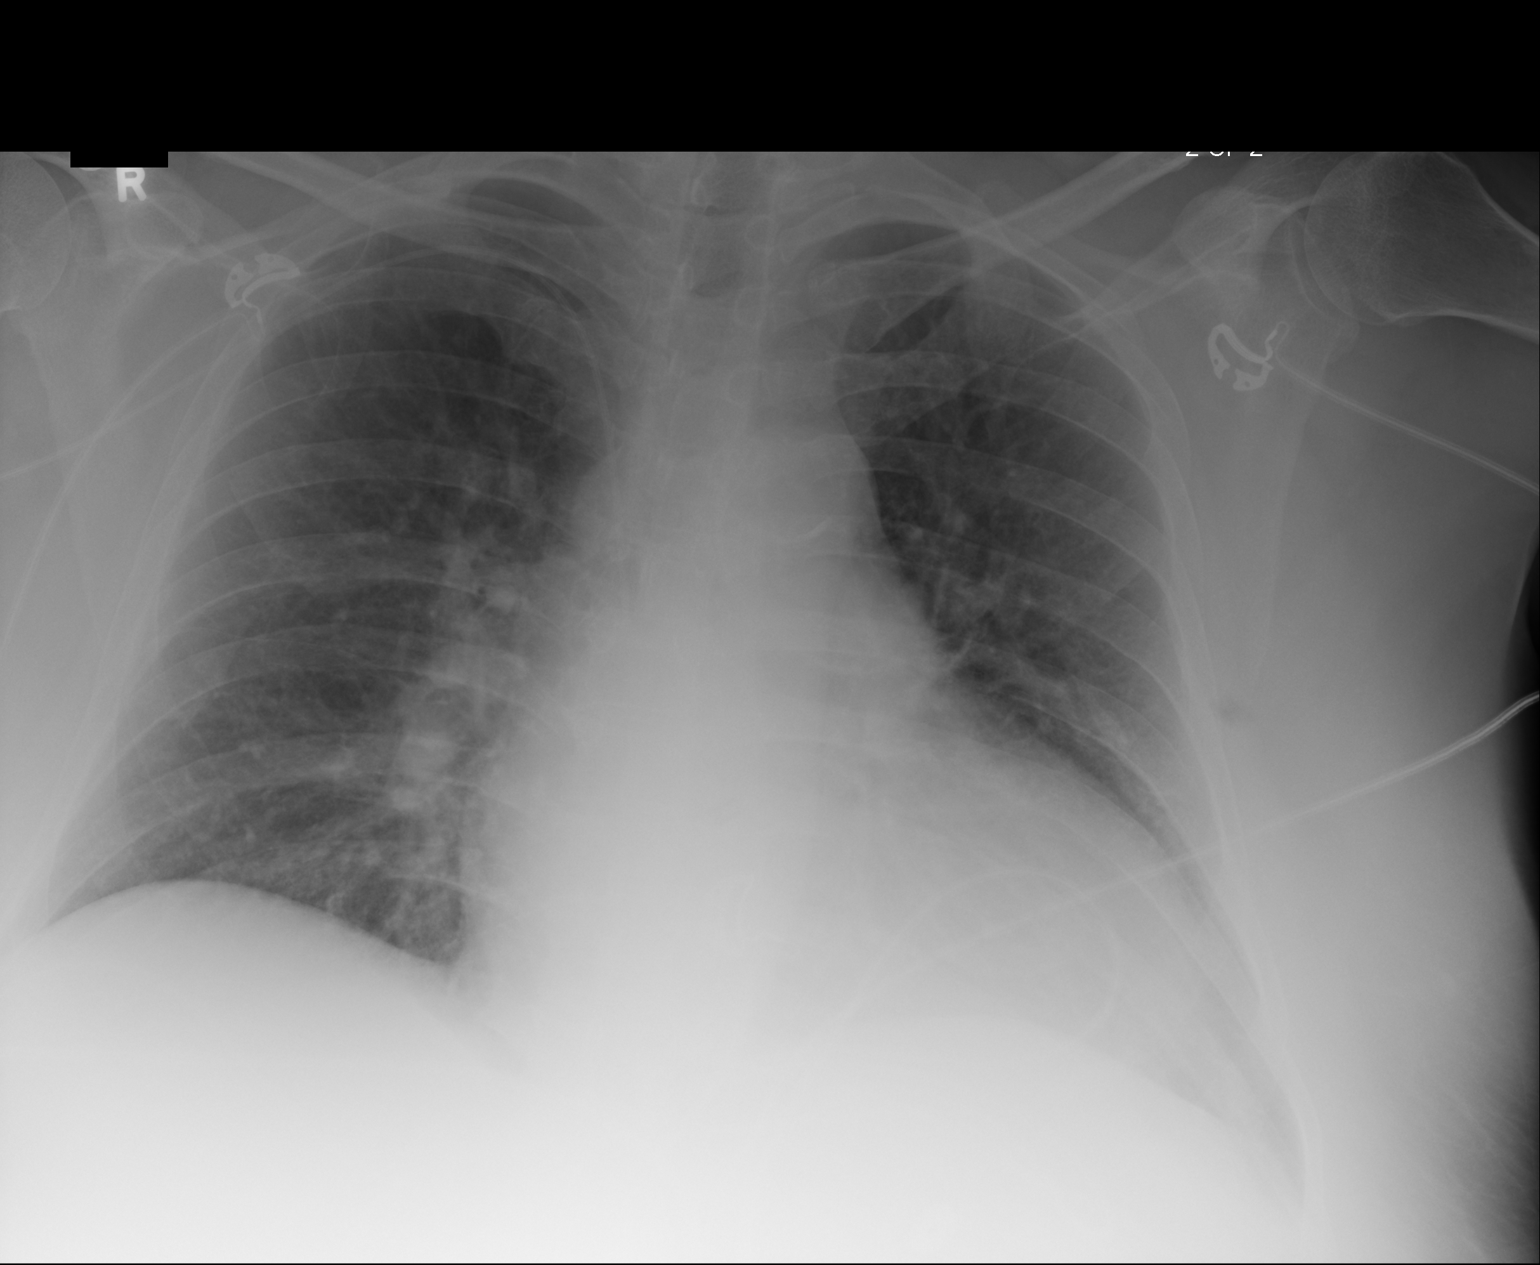
[im 3/3]
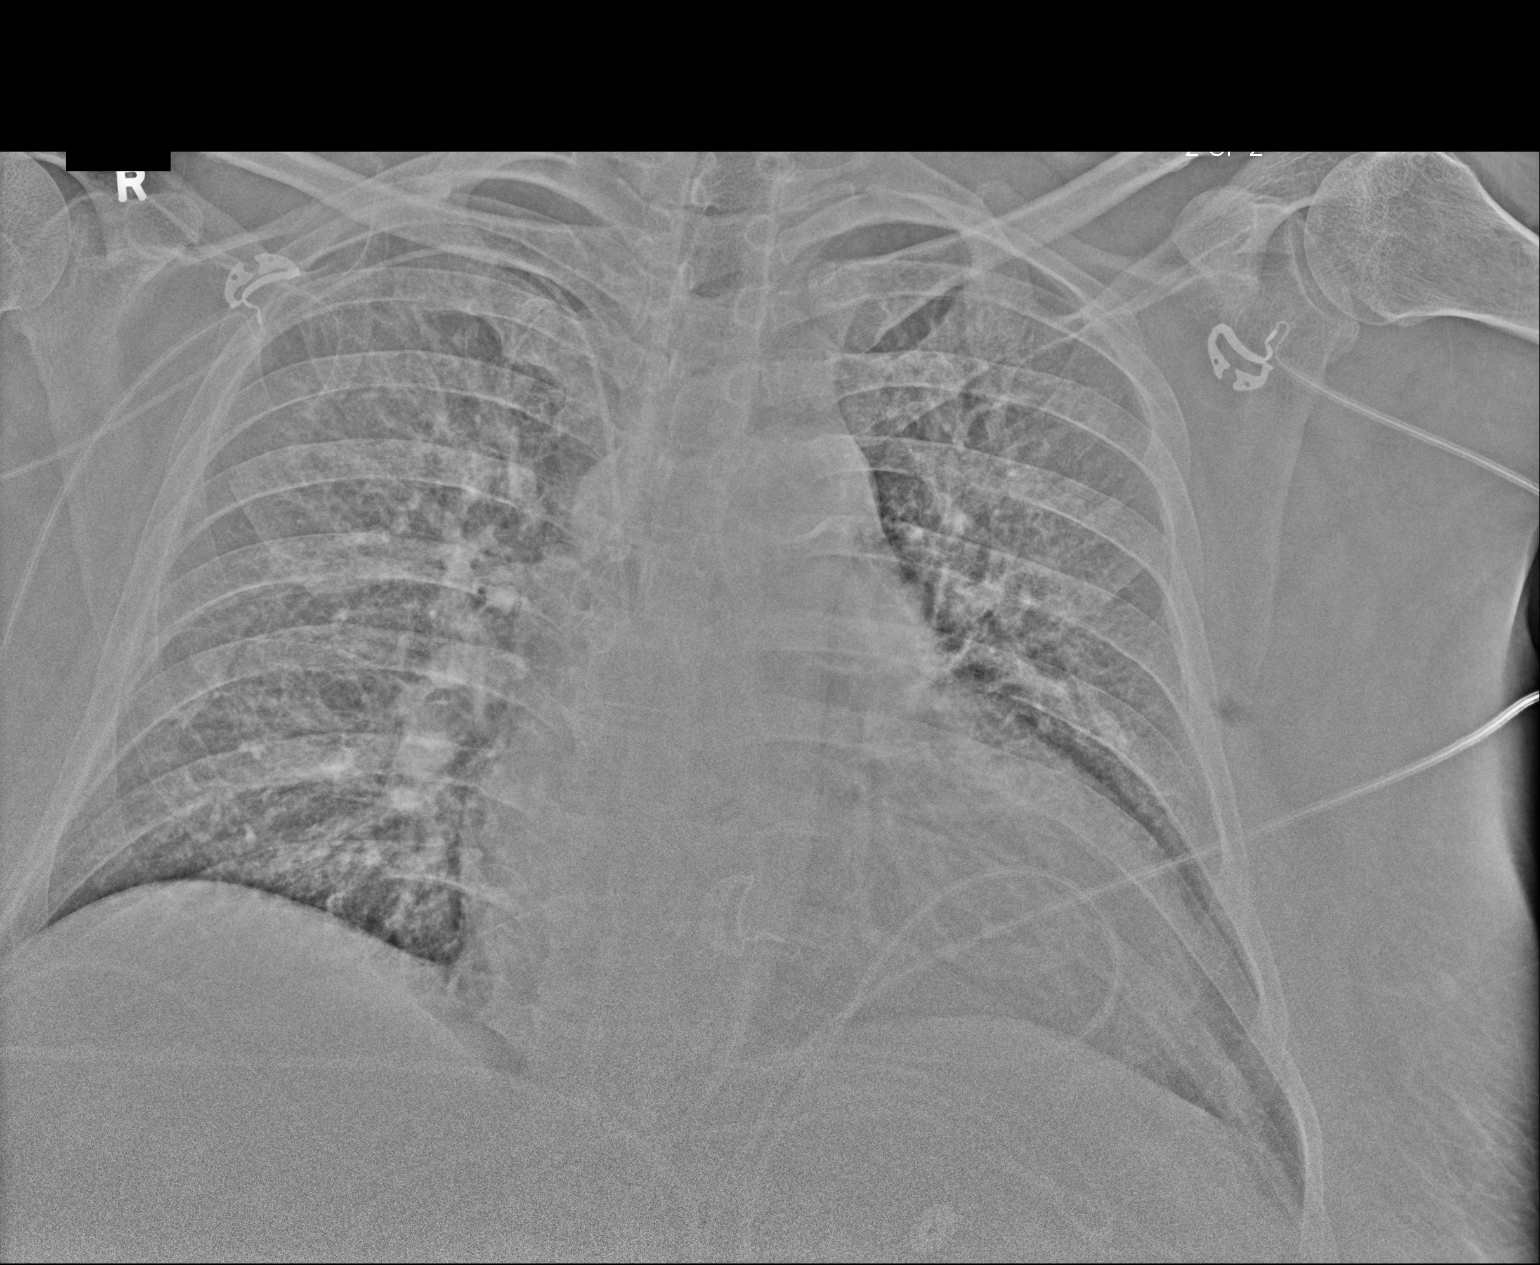

[3 of 3 positions shown; findings below may reference images not displayed]

FINDINGS: A right PICC is noted ending about the distal SVC.

The lungs are well-aerated. Vascular congestion is noted. There is
no evidence of focal opacification, pleural effusion or
pneumothorax.

The cardiomediastinal silhouette is mildly enlarged. No acute
osseous abnormalities are seen.
IMPRESSION: 1. Right PICC noted ending about the distal SVC.
2. Vascular congestion and mild cardiomegaly. Lungs remain grossly
clear.

## 2015-07-16 MED ORDER — BISACODYL 10 MG RE SUPP
10.0000 mg | Freq: Every day | RECTAL | Status: DC | PRN
  Administered 2015-07-17: 10 mg via RECTAL
  Filled 2015-07-16: qty 1

## 2015-07-16 MED ORDER — DOPAMINE-DEXTROSE 3.2-5 MG/ML-% IV SOLN
2.5000 ug/kg/min | INTRAVENOUS | Status: DC
  Administered 2015-07-16: 2.5 ug/kg/min via INTRAVENOUS
  Filled 2015-07-16: qty 250

## 2015-07-16 MED ORDER — FUROSEMIDE 10 MG/ML IJ SOLN
40.0000 mg | Freq: Three times a day (TID) | INTRAMUSCULAR | Status: DC
  Administered 2015-07-16 – 2015-07-19 (×9): 40 mg via INTRAVENOUS
  Filled 2015-07-16 (×9): qty 4

## 2015-07-16 MED ORDER — DOCUSATE SODIUM 100 MG PO CAPS
100.0000 mg | ORAL_CAPSULE | Freq: Two times a day (BID) | ORAL | Status: DC
  Administered 2015-07-16 – 2015-08-01 (×31): 100 mg via ORAL
  Filled 2015-07-16 (×33): qty 1

## 2015-07-16 NOTE — Progress Notes (Signed)
2130: Called E-link and spoke with Eddie Dibbles, RN r/t pt.HR which since 2115 has been 120-130's and is on dobutamine gtt @ lowest dose (see EMR for details). Also relayed need for bowel regimen as LBM 07/12/15.  2145:  Spoke w/ MD Sommers r/t pt. HR and MD deferred to cardiologist. Will page cardiologist on-call. MD Wallis Bamberg initiated bowel regimen.

## 2015-07-16 NOTE — Progress Notes (Signed)
Changed pt.'s PICC dsg, due to old drainage dsg was difficult to remove and PICC line was pulled out 3 cm.  Spoke w/ E-link MD who ordered a CXR.  CXR showed tip in distal SVC, spoke w/ E-link again and was told the PICC was safe to use.  Both lines get blood return.

## 2015-07-16 NOTE — Progress Notes (Signed)
MD Gollan called back to check-in on pt. Pt.'s HR has decreased to 118-123, MD decided to switch and stop dopamine gtt restarting dobutamine gtt to see if that makes more of an impact. Will continue to monitor pt. Closely.

## 2015-07-16 NOTE — Progress Notes (Signed)
[facility] Progress Note Patient Name: Roswell Adornetto DOB: 1950/07/16 MRN: [id number]   Date of Service  07/16/2015  HPI/Events of Note  Constipation since 07/12/2015.  eICU Interventions  Will order: 1. Colace 100 mg PO BID. 2. Dulcolax 10 mg PR Q day PRN constipation.      Intervention Category Intermediate Interventions: Other:  Lysle Dingwall 07/16/2015, 9:47 PM

## 2015-07-16 NOTE — Progress Notes (Signed)
Spoke w/ MD Rockey Situ, cardiologist on-call discussed pt.'s HR, history, UOP.  MD ordered stopping dobutamine gtt and maintaining dopamine gtt to see if that impacts pt.'s HR. Will continue to monitor pt. Closely.

## 2015-07-16 NOTE — Progress Notes (Signed)
Pt. Has dobutamine gtt running and has been in ST w/ SBP in the 100's-110's.  Pt. Has been afebrile and O2SATs > 92% on room air when awake and 3L [place] when asleep.  Pt. Has not c/o of pain, nausea or any type to discomfort but only slept 3 hours which pt. States is baseline.  Pt. Refused bath from both RN and NT stating he wants to wait for d/c.  BUN and Cr. Are continuing to trend down this AM and pt.'s I & O is  - 240 for this shift.  Pt. Is passing gas but LBM was on 2/7 - will discuss with oncoming RN to bring up bowel regimen during rounds.

## 2015-07-16 NOTE — Progress Notes (Signed)
Patient: [name] / Admit Date: 07/08/2015 / Date of Encounter: 07/16/2015, 11:35 AM   Subjective: ABD bloating, leg swelling, very slow improvement, Improving SOB, On dobutamine 2.5 ug/kg/min Total >6 liters negative this admission.  Review of Systems: Review of Systems  Constitutional: Negative.   Respiratory: Positive for shortness of breath.   Cardiovascular: Positive for leg swelling.  Gastrointestinal:       ABD distention  Musculoskeletal: Negative.   Neurological: Negative.   Psychiatric/Behavioral: Negative.   All other systems reviewed and are negative.   Objective: Telemetry:  Physical Exam: Blood pressure 128/104, pulse 114, temperature 98.6 F (37 C), temperature source Oral, resp. rate 21, height 6' (1.829 m), weight 308 lb 10.3 oz (140 kg), SpO2 93 %. Body mass index is 41.85 kg/(m^2). General: Morbidly obese Caucasian male appearing in no acute distress. Head: Normocephalic, atraumatic. Neck: Supple without bruits, JVD unable to be assessed secondary to body habitus. Lungs: Resp regular and unlabored, decreased breath sounds at the bases bilaterally. Heart: RRR,  Tachycardia, S1, S2, no S3, S4, or murmur; no rub.  Abdomen: Soft, non-tender, with normoactive bowel sounds. No hepatomegaly. No rebound/guarding. No obvious abdominal masses. Appears distended. Extremities: No clubbing or cyanosis, 2+ edema bilaterally.  Neuro: Alert and oriented X 3. Moves all extremities spontaneously. Psych: Normal affect.   Intake/Output Summary (Last 24 hours) at 07/16/15 1135 Last data filed at 07/16/15 0949  Gross per 24 hour  Intake 2390.8 ml  Output   2800 ml  Net -409.2 ml    Inpatient Medications:  . amiodarone  200 mg Oral BID  . apixaban  5 mg Oral BID  . aspirin  81 mg Oral Daily  . atorvastatin  40 mg Oral q1800  . cephALEXin  500 mg Oral 3 times per day  . furosemide  40 mg Intravenous Q12H  . insulin aspart  0-5 Units Subcutaneous QHS  .  insulin aspart  0-9 Units Subcutaneous TID WC  . insulin aspart  3 Units Subcutaneous TID WC  . insulin glargine  20 Units Subcutaneous QHS  . loratadine  10 mg Oral QHS  . potassium chloride  40 mEq Oral Daily  . sodium chloride flush  3 mL Intravenous Q12H  . spironolactone  25 mg Oral QHS   Infusions:  . DOBUTamine 2.5 mcg/kg/min (07/16/15 0700)    Labs:  Recent Labs  07/15/15 0428 07/16/15 0428  NA 136 135  K 4.3 4.0  CL 95* 94*  CO2 34* 34*  GLUCOSE 169* 109*  BUN 44* 36*  CREATININE 1.63* 1.61*  CALCIUM 9.3 8.9   No results for input(s): AST, ALT, ALKPHOS, BILITOT, PROT, ALBUMIN in the last 72 hours.  Recent Labs  07/14/15 0510 07/15/15 0428  WBC 5.9 5.4  HGB 12.3* 12.1*  HCT 37.0* 36.9*  MCV 97.7 97.1  PLT 144* 140*   No results for input(s): CKTOTAL, CKMB, TROPONINI in the last 72 hours. Invalid input(s): POCBNP No results for input(s): HGBA1C in the last 72 hours.   Weights: Filed Weights   07/14/15 0500 07/15/15 0436 07/16/15 0400  Weight: 313 lb 0.9 oz (142 kg) 306 lb 7 oz (139 kg) 308 lb 10.3 oz (140 kg)     Radiology/Studies:  Dg Chest 2 View  07/08/2015  CLINICAL DATA:  Patient with a history of chf and cardiomyopathy. Patient states that since December he has developed shortness of breath, bilateral leg swelling and vertigo. Patient states that over the last week the symptoms  have become worse. EXAM: CHEST  2 VIEW COMPARISON:  None. FINDINGS: Normal cardiac silhouette. There is central venous congestion. No effusion, infiltrate pneumothorax. No pleural fluid. No acute osseous abnormality. IMPRESSION: Central venous congestion.  No pleural floor no overt edema. Electronically Signed   By: Suzy Bouchard M.D.   On: 07/08/2015 20:43   Dg Chest Port 1 View  07/16/2015  CLINICAL DATA:  Check central line position.  Initial encounter. EXAM: PORTABLE CHEST 1 VIEW COMPARISON:  Chest radiograph performed 07/13/2015 FINDINGS: A right PICC is noted ending  about the distal SVC. The lungs are well-aerated. Vascular congestion is noted. There is no evidence of focal opacification, pleural effusion or pneumothorax. The cardiomediastinal silhouette is mildly enlarged. No acute osseous abnormalities are seen. IMPRESSION: 1. Right PICC noted ending about the distal SVC. 2. Vascular congestion and mild cardiomegaly. Lungs remain grossly clear. Electronically Signed   By: Garald Balding M.D.   On: 07/16/2015 03:34   Dg Chest Port 1 View  07/13/2015  CLINICAL DATA:  PICC line placement EXAM: PORTABLE CHEST 1 VIEW COMPARISON:  Chest x-ray dated 07/08/2015. FINDINGS: Interval placement of a right-sided PICC line with tip well-positioned in the lower SVC, near the expected location of the cavoatrial junction. Cardiomegaly is stable. Lungs are clear. No pleural effusion seen. No pneumothorax seen. IMPRESSION: 1. Right-sided PICC line placement, well positioned with tip near the expected location of the cavoatrial junction. 2. Stable cardiomegaly. Electronically Signed   By: Franki Cabot M.D.   On: 07/13/2015 18:19     Assessment and Plan  65 y.o. male   1. Acute on chronic combined systolic and diastolic heart failure  EF of 15-20% with moderate mitral regurgitation and moderate pulmonary hypertension and severely dilated inferior vena cava.  on low dose dobutamine.  Furosemide was resumed Thursday, slow urine output but improved still moderately fluid overloaded  ---will add dopamine 2 to 3 ug infusion and increase lasix dosing to Q8 given continued massive fluid overload Plan discussed with Dr. Theodoro Doing  2. Newly diagnosed atrial fibrillation with rapid ventricular response - converted to NSR on 07/11/2015 - This patients CHA2DS2-VASc Score and unadjusted Ischemic Stroke Rate (% per year) is equal to 4.8 % stroke rate/year from a score of 4 (CHF, HTN, DM, Vascular). Continue Eliquis for anticoagulation.  - continue with PO Amiodarone 200 mg BID. BB on hold due  to Inotropic therapy.  3. Coronary artery disease with previous PCI/ Elevated Troponin - Given the decline in his ejection fraction (previously reported as 25% in 2013),  will need right and left cardiac catheterization once his volume status improves   4. Morbid obesity - BMI of 42.8 on admission. - Likely has sleep apnea as well, needs CPAP  5. 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 Peaked at 2.13 on 07/13/2015.  Trending down   6. Uncontrolled Type 2 DM - A1c 11.0 this admission Strict diet    Total encounter time more than 35 minutes  Greater than 50% was spent in counseling and coordination of care with the patient   Signed, Esmond Plants, MD, Ph.D. [facility] 07/16/2015, 11:35 AM

## 2015-07-16 NOTE — Progress Notes (Signed)
eLink Physician-Brief Progress Note Patient Name: [name] DOB: [birth year] MRN: [id number]   Date of Service  07/16/2015  HPI/Events of Note  Notified that PICC line accidentally withdrawn 3cm.  eICU Interventions  Stat Portable CXR to confirm placement.     Intervention Category Intermediate Interventions: Other:  Tera Partridge 07/16/2015, 3:16 AM

## 2015-07-16 NOTE — Progress Notes (Signed)
[name] at [place] NAME: [name]    MR#:  [id number]  DATE OF BIRTH:  02/16/1951  SUBJECTIVE:   Patient is here due to acute on chronic systolic CHF. Shortness of breath is improving. [name] diuresing well with IV Lasix. Remains on a dobutamine drip. As per cardiology will also start him on a dopamine drip and increase his Lasix to improve diuresis. No other complaints presently.  REVIEW OF SYSTEMS:    Review of Systems  Constitutional: Negative for fever and chills.  HENT: Negative for congestion and tinnitus.   Eyes: Negative for blurred vision and double vision.  Respiratory: Negative for cough, shortness of breath and wheezing.   Cardiovascular: Negative for chest pain, orthopnea and PND.  Gastrointestinal: Negative for nausea, vomiting, abdominal pain and diarrhea.  Genitourinary: Negative for dysuria and hematuria.  Neurological: Negative for dizziness, sensory change and focal weakness.  All other systems reviewed and are negative.   Nutrition: Heart healthy Tolerating Diet: yes Tolerating PT: Await Eval.    DRUG ALLERGIES:  No Known Allergies  VITALS:  Blood pressure 108/74, pulse 117, temperature 98.6 F (37 C), temperature source Oral, resp. rate 18, height 6' (1.829 m), weight 140 kg (308 lb 10.3 oz), SpO2 96 %.  PHYSICAL EXAMINATION:   Physical Exam  GENERAL:  65 y.o.-year-old obese patient sitting up in chair in no acute distress.  EYES: Pupils equal, round, reactive to light and accommodation. No scleral icterus. Extraocular muscles intact.  HEENT: Head atraumatic, normocephalic. Oropharynx and nasopharynx clear.  NECK:  Supple, no jugular venous distention. No thyroid enlargement, no tenderness.  LUNGS: Normal breath sounds bilaterally, no wheezing, rales, rhonchi. No use of accessory muscles of respiration.  CARDIOVASCULAR: S1, S2 normal. No murmurs, rubs, or gallops.  ABDOMEN: Soft, nontender,  nondistended. Bowel sounds present. No organomegaly or mass.  EXTREMITIES: No cyanosis, clubbing, + 1-2 edema b/l.     NEUROLOGIC: Cranial nerves II through XII are intact. No focal Motor or sensory deficits b/l.   PSYCHIATRIC: The patient is alert and oriented x 3.  SKIN: No obvious rash, lesion, or ulcer.    LABORATORY PANEL:   CBC  Recent Labs Lab 07/15/15 0428  WBC 5.4  HGB 12.1*  HCT 36.9*  PLT 140*   ------------------------------------------------------------------------------------------------------------------  Chemistries   Recent Labs Lab 07/16/15 0428  NA 135  K 4.0  CL 94*  CO2 34*  GLUCOSE 109*  BUN 36*  CREATININE 1.61*  CALCIUM 8.9   ------------------------------------------------------------------------------------------------------------------  Cardiac Enzymes No results for input(s): TROPONINI in the last 168 hours. ------------------------------------------------------------------------------------------------------------------  RADIOLOGY:  Dg Chest Port 1 View  07/16/2015  CLINICAL DATA:  Check central line position.  Initial encounter. EXAM: PORTABLE CHEST 1 VIEW COMPARISON:  Chest radiograph performed 07/13/2015 FINDINGS: A right PICC is noted ending about the distal SVC. The lungs are well-aerated. Vascular congestion is noted. There is no evidence of focal opacification, pleural effusion or pneumothorax. The cardiomediastinal silhouette is mildly enlarged. No acute osseous abnormalities are seen. IMPRESSION: 1. Right PICC noted ending about the distal SVC. 2. Vascular congestion and mild cardiomegaly. Lungs remain grossly clear. Electronically Signed   By: Garald Balding M.D.   On: 07/16/2015 03:34     ASSESSMENT AND PLAN:   65 year old male with past medical history of chronic systolic CHF, obstructive sleep apnea, diabetes type 2 that, patient, chronic kidney disease stage III, hypertension, atrial fibrillation who presented to the hospital  shortness of  breath and noted to be in congestive heart failure.  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 -Continue diuresis with IV Lasix and continue dobutamine. Appreciate cardiology input and will add low-dose dopamine and increase Lasix to improve diuresis. Cont. Aldactone.  -  -Patient is about 6 L negative since admission. We'll continue aggressive diuresis for another 2 days.  #2 chronic atrial fibrillation-continue amiodarone, rate controlled. -Continue Eliquis  #3. Bilateral lower extremity cellulitis-continue Keflex.  #4 type 2 diabetes without complication-continue Lantus, NovoLog with meals - Blood Sugars stable  #5. HyperLipidemia-continue atorvastatin.  #6. Anxiety - cont. Xanax.     All the records are reviewed and case discussed with Care Management/Social Workerr. Management plans discussed with the patient, family and they are in agreement.  CODE STATUS: Full   DVT Prophylaxis: Eliquis  TOTAL TIME TAKING CARE OF THIS PATIENT: 25 minutes.   POSSIBLE D/C IN 2-3 DAYS, DEPENDING ON CLINICAL CONDITION.   [name] M.D on 07/16/2015 at 3:47 PM  Between 7am to 6pm - Pager - [phone]  After 6pm go to [email or web address] - password [email or web address] [facility]  [place] [facility]  Office  [phone]  CC: Primary care physician; No PCP Per Patient

## 2015-07-16 NOTE — Plan of Care (Signed)
Problem: Safety: Goal: Ability to remain free from injury will improve Outcome: Progressing Pt. Alerts staff when needing to stand/get out of bed

## 2015-07-16 NOTE — Progress Notes (Signed)
Pt. Has been having miscommunication problems with dietary for this past week r/t his carb-modified diet.  Pt. Called today before 1600 and spoke with Kennyth Lose in dietary ordering dinner per what he and the  nutritionist discussed.  Dinner never arrived and at [employer] pt. Brought up to staff that dinner never came.  [place], NT called dietary which was closed.  We gave the option of cereal/turkey sandwich: but the pt. Does not like [place] and had already had cereal. Pt. Was extremely upset stating this was the only area of his stay that has been unsatisfactory.  Spoke with Lelon Frohlich, RN supervisor per [employer] charge RN suggestion who went into the dietary offices and got dinner for the pt.

## 2015-07-17 DIAGNOSIS — G4733 Obstructive sleep apnea (adult) (pediatric): Secondary | ICD-10-CM

## 2015-07-17 LAB — BASIC METABOLIC PANEL
ANION GAP: 8 (ref 5–15)
BUN: 35 mg/dL — ABNORMAL HIGH (ref 6–20)
CALCIUM: 8.8 mg/dL — AB (ref 8.9–10.3)
CHLORIDE: 93 mmol/L — AB (ref 101–111)
CO2: 34 mmol/L — AB (ref 22–32)
CREATININE: 1.53 mg/dL — AB (ref 0.61–1.24)
GFR calc non Af Amer: 47 mL/min — ABNORMAL LOW (ref >60)
GFR, EST AFRICAN AMERICAN: 54 mL/min — AB (ref >60)
Glucose, Bld: 195 mg/dL — ABNORMAL HIGH (ref 65–99)
Potassium: 4.5 mmol/L (ref 3.5–5.1)
SODIUM: 135 mmol/L (ref 135–145)

## 2015-07-17 LAB — GLUCOSE, CAPILLARY
GLUCOSE-CAPILLARY: 186 mg/dL — AB (ref 65–99)
GLUCOSE-CAPILLARY: 219 mg/dL — AB (ref 65–99)
GLUCOSE-CAPILLARY: 291 mg/dL — AB (ref 65–99)
Glucose-Capillary: 274 mg/dL — ABNORMAL HIGH (ref 65–99)

## 2015-07-17 NOTE — Progress Notes (Signed)
Patient: [name] / Admit Date: 07/08/2015 / Date of Encounter: 07/17/2015, 12:08 PM   Subjective: difficulty tolerating  CPAP yesterday, felt like he was suffocating, took this off after 6 minutes  Abdomen less distended and tense, Still with significant leg swelling  Lasix increased up to every 8 hours yesterday  Had tachycardia last night, was called by nurses, heart rates more than 120 Initially held dobutamine with minimal improvement of his heart rate Felt like he was smothering without dobutamine Restarted dobutamine, held dopamine, placed back in bed with improvement of his heart rate and symptoms  Review of intake show significant fluid intake daily. For some reason the kitchen is providing large volumes of ice tea, and he is drinking water from a large cup. Despite that he was still negative, reports having improve shortness of breath  Review of Systems: Review of Systems  Constitutional: Negative.   Respiratory: Positive for shortness of breath.   Cardiovascular: Positive for leg swelling.  Gastrointestinal:       ABD distention  Musculoskeletal: Negative.   Neurological: Negative.   Psychiatric/Behavioral: Negative.   All other systems reviewed and are negative.   Objective: Telemetry: Sinus tachycardia, rate 110 bpm Physical Exam: Blood pressure 108/80, pulse 108, temperature 97.8 F (36.6 C), temperature source Oral, resp. rate 16, height 6' (1.829 m), weight 310 lb 10.1 oz (140.9 kg), SpO2 100 %. Body mass index is 42.12 kg/(m^2). General: Morbidly obese Caucasian male appearing in no acute distress. Head: Normocephalic, atraumatic. Neck: Supple without bruits, JVD unable to be assessed secondary to body habitus. Lungs: Resp regular and unlabored, decreased breath sounds at the bases bilaterally. Heart: RRR,  Tachycardia, S1, S2, no S3, S4, or murmur; no rub.  Abdomen: Soft, non-tender, with normoactive bowel sounds. No hepatomegaly. No  rebound/guarding. No obvious abdominal masses. Appears distended. Extremities: No clubbing or cyanosis, 2+ edema bilaterally.  Neuro: Alert and oriented X 3. Moves all extremities spontaneously. Psych: Normal affect.   Intake/Output Summary (Last 24 hours) at 07/17/15 1208 Last data filed at 07/17/15 1050  Gross per 24 hour  Intake 2074.49 ml  Output   3450 ml  Net -1375.51 ml    Inpatient Medications:  . amiodarone  200 mg Oral BID  . apixaban  5 mg Oral BID  . aspirin  81 mg Oral Daily  . atorvastatin  40 mg Oral q1800  . cephALEXin  500 mg Oral 3 times per day  . docusate sodium  100 mg Oral BID  . furosemide  40 mg Intravenous 3 times per day  . insulin aspart  0-5 Units Subcutaneous QHS  . insulin aspart  0-9 Units Subcutaneous TID WC  . insulin aspart  3 Units Subcutaneous TID WC  . insulin glargine  20 Units Subcutaneous QHS  . loratadine  10 mg Oral QHS  . potassium chloride  40 mEq Oral Daily  . sodium chloride flush  3 mL Intravenous Q12H  . spironolactone  25 mg Oral QHS   Infusions:  . DOBUTamine 3.5 mcg/kg/min (07/17/15 1110)  . DOPamine Stopped (07/16/15 2321)    Labs:  Recent Labs  07/16/15 0428 07/17/15 0522  NA 135 135  K 4.0 4.5  CL 94* 93*  CO2 34* 34*  GLUCOSE 109* 195*  BUN 36* 35*  CREATININE 1.61* 1.53*  CALCIUM 8.9 8.8*   No results for input(s): AST, ALT, ALKPHOS, BILITOT, PROT, ALBUMIN in the last 72 hours.  Recent Labs  07/15/15 0428  WBC 5.4  HGB 12.1*  HCT 36.9*  MCV 97.1  PLT 140*   No results for input(s): CKTOTAL, CKMB, TROPONINI in the last 72 hours. Invalid input(s): POCBNP No results for input(s): HGBA1C in the last 72 hours.   Weights: Filed Weights   07/15/15 0436 07/16/15 0400 07/17/15 0500  Weight: 306 lb 7 oz (139 kg) 308 lb 10.3 oz (140 kg) 310 lb 10.1 oz (140.9 kg)     Radiology/Studies:  Dg Chest 2 View  07/08/2015  CLINICAL DATA:  Patient with a history of chf and cardiomyopathy. Patient states that  since December he has developed shortness of breath, bilateral leg swelling and vertigo. Patient states that over the last week the symptoms have become worse. EXAM: CHEST  2 VIEW COMPARISON:  None. FINDINGS: Normal cardiac silhouette. There is central venous congestion. No effusion, infiltrate pneumothorax. No pleural fluid. No acute osseous abnormality. IMPRESSION: Central venous congestion.  No pleural floor no overt edema. Electronically Signed   By: Suzy Bouchard M.D.   On: 07/08/2015 20:43   Dg Chest Port 1 View  07/16/2015  CLINICAL DATA:  Check central line position.  Initial encounter. EXAM: PORTABLE CHEST 1 VIEW COMPARISON:  Chest radiograph performed 07/13/2015 FINDINGS: A right PICC is noted ending about the distal SVC. The lungs are well-aerated. Vascular congestion is noted. There is no evidence of focal opacification, pleural effusion or pneumothorax. The cardiomediastinal silhouette is mildly enlarged. No acute osseous abnormalities are seen. IMPRESSION: 1. Right PICC noted ending about the distal SVC. 2. Vascular congestion and mild cardiomegaly. Lungs remain grossly clear. Electronically Signed   By: Garald Balding M.D.   On: 07/16/2015 03:34   Dg Chest Port 1 View  07/13/2015  CLINICAL DATA:  PICC line placement EXAM: PORTABLE CHEST 1 VIEW COMPARISON:  Chest x-ray dated 07/08/2015. FINDINGS: Interval placement of a right-sided PICC line with tip well-positioned in the lower SVC, near the expected location of the cavoatrial junction. Cardiomegaly is stable. Lungs are clear. No pleural effusion seen. No pneumothorax seen. IMPRESSION: 1. Right-sided PICC line placement, well positioned with tip near the expected location of the cavoatrial junction. 2. Stable cardiomegaly. Electronically Signed   By: Franki Cabot M.D.   On: 07/13/2015 18:19     Assessment and Plan  65 y.o. male   1. Acute on chronic combined systolic and diastolic heart failure  EF of 15-20% with moderate mitral  regurgitation and moderate pulmonary hypertension and severely dilated inferior vena cava.  on low dose dobutamine.  Tried adding dopamine yesterday but only had tachycardia, still moderately fluid overloaded  Felt like he was smothering yesterday without dobutamine, improved symptoms back on the infusion Plan discussed with Dr. Theodoro Doing --- Plan is to continue Lasix every 8 hours --We will try to increase in dobutamine up to 5 g to augment urine output, close monitoring of arrhythmia or tachycardia --Hold dopamine permanently, blood pressures are stable --Talk with nurses and patient, will stress the importance of decreased fluid intake. Would likely be advancing more rapidly with diuresis if intake was decreased  2. Newly diagnosed atrial fibrillation with rapid ventricular response - converted to NSR on 07/11/2015 - This patients CHA2DS2-VASc Score and unadjusted Ischemic Stroke Rate (% per year) is equal to 4.8 % stroke rate/year from a score of 4 (CHF, HTN, DM, Vascular). Continue Eliquis for anticoagulation.  - continue with PO Amiodarone 200 mg BID. BB on hold due to Inotropic therapy. Telemetry reviewed, sinus tachycardia with no atrial fibrillation  3. Coronary  artery disease with previous PCI/ Elevated Troponin - Given the decline in his ejection fraction (previously reported as 25% in 2013),  will need right and left cardiac catheterization once his volume status improves  Perhaps could be done when we feel he is euvolemic, when trying to wean off dobutamine  4. Morbid obesity - BMI of 42.8 on admission. - Likely has sleep apnea as well, needs CPAP  5. 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 Peaked at 2.13 on 07/13/2015.  Continues to trend down, now 1.5  6. Uncontrolled Type 2 DM - A1c 11.0 this admission Drinks significant volume of suite  tea, this has been stopped, needs diabetes education  Strict diet    Total encounter time more than 35 minutes  Greater than 50% was spent in counseling and coordination of care with the patient   Signed, Esmond Plants, MD, Ph.D. [facility] 07/17/2015, 12:08 PM

## 2015-07-17 NOTE — Progress Notes (Signed)
Pt attempted to wear Nasal CPAP but can not tolerate the CPAP even on minimum settings

## 2015-07-17 NOTE — Progress Notes (Signed)
[place] at [place] NAME: [name]    MR#:  [id number]  DATE OF BIRTH:  04-11-1951  SUBJECTIVE:   Was started on dopamine gtt yesterday but became tachycardic and it was discontinued.  Pt. Has increasing intake of fluids by drinking large volumes of ice tea and water.  Remains on Dobutamine and IV Lasix.   REVIEW OF SYSTEMS:    Review of Systems  Constitutional: Negative for fever and chills.  HENT: Negative for congestion and tinnitus.   Eyes: Negative for blurred vision and double vision.  Respiratory: Negative for cough, shortness of breath and wheezing.   Cardiovascular: Negative for chest pain, orthopnea and PND.  Gastrointestinal: Negative for nausea, vomiting, abdominal pain and diarrhea.  Genitourinary: Negative for dysuria and hematuria.  Neurological: Negative for dizziness, sensory change and focal weakness.  All other systems reviewed and are negative.   Nutrition: Heart healthy Tolerating Diet: yes Tolerating PT: Await Eval.    DRUG ALLERGIES:  No Known Allergies  VITALS:  Blood pressure 97/65, pulse 105, temperature 97.7 F (36.5 C), temperature source Oral, resp. rate 13, height 6' (1.829 m), weight 140.9 kg (310 lb 10.1 oz), SpO2 100 %.  PHYSICAL EXAMINATION:   Physical Exam  GENERAL:  65 y.o.-year-old obese patient sitting up in chair in no acute distress.  EYES: Pupils equal, round, reactive to light and accommodation. No scleral icterus. Extraocular muscles intact.  HEENT: Head atraumatic, normocephalic. Oropharynx and nasopharynx clear.  NECK:  Supple, no jugular venous distention. No thyroid enlargement, no tenderness.  LUNGS: Normal breath sounds bilaterally, no wheezing, rales, rhonchi. No use of accessory muscles of respiration.  CARDIOVASCULAR: S1, S2 normal. No murmurs, rubs, or gallops.  ABDOMEN: Soft, nontender, nondistended. Bowel sounds present. No organomegaly or mass.  EXTREMITIES: No  cyanosis, clubbing, + 1-2 edema b/l.     NEUROLOGIC: Cranial nerves II through XII are intact. No focal Motor or sensory deficits b/l.   PSYCHIATRIC: The patient is alert and oriented x 3.  SKIN: No obvious rash, lesion, or ulcer.    LABORATORY PANEL:   CBC  Recent Labs Lab 07/15/15 0428  WBC 5.4  HGB 12.1*  HCT 36.9*  PLT 140*   ------------------------------------------------------------------------------------------------------------------  Chemistries   Recent Labs Lab 07/17/15 0522  NA 135  K 4.5  CL 93*  CO2 34*  GLUCOSE 195*  BUN 35*  CREATININE 1.53*  CALCIUM 8.8*   ------------------------------------------------------------------------------------------------------------------  Cardiac Enzymes No results for input(s): TROPONINI in the last 168 hours. ------------------------------------------------------------------------------------------------------------------  RADIOLOGY:  Dg Chest Port 1 View  07/16/2015  CLINICAL DATA:  Check central line position.  Initial encounter. EXAM: PORTABLE CHEST 1 VIEW COMPARISON:  Chest radiograph performed 07/13/2015 FINDINGS: A right PICC is noted ending about the distal SVC. The lungs are well-aerated. Vascular congestion is noted. There is no evidence of focal opacification, pleural effusion or pneumothorax. The cardiomediastinal silhouette is mildly enlarged. No acute osseous abnormalities are seen. IMPRESSION: 1. Right PICC noted ending about the distal SVC. 2. Vascular congestion and mild cardiomegaly. Lungs remain grossly clear. Electronically Signed   By: Garald Balding M.D.   On: 07/16/2015 03:34     ASSESSMENT AND PLAN:   65 year old male with past medical history of chronic systolic CHF, obstructive sleep apnea, diabetes type 2 that, patient, chronic kidney disease stage III, hypertension, atrial fibrillation who presented to the hospital shortness of breath and noted to be in congestive heart failure.  #1 acute  on chronic systolic CHF-patient has underlying severe cardiomyopathy of a 15-20%. -Continue diuresis with IV Lasix and continue dobutamine. Appreciate cardiology input they attempted to add dopamine but pt. Became tachycardic and it was stopped.   -Patient is about 7 L negative since admission. Patient will be fluid restricted as he has had increasing fluid intake over the past few days. Continue aggressive IV diuresis for the next few days. Follow I's and O's.  #2 chronic atrial fibrillation-continue amiodarone, rate controlled. -Continue Eliquis  #3. Bilateral lower extremity cellulitis-this seems more like chronic venous stasis. I will DC Keflex for now.  #4 type 2 diabetes without complication-continue Lantus, NovoLog with meals - Blood Sugars stable  #5. HyperLipidemia-continue atorvastatin.  #6. Anxiety - cont. Xanax.    #7 obstructive sleep apnea-attempted to give patient CPAP last night but he could not tolerate the nasal CPAP. We'll continue to monitor.   All the records are reviewed and case discussed with Care Management/Social Workerr. Management plans discussed with the patient, family and they are in agreement.  CODE STATUS: Full   DVT Prophylaxis: Eliquis  TOTAL TIME TAKING CARE OF THIS PATIENT: 25 minutes.   POSSIBLE D/C IN 2-3 DAYS, DEPENDING ON CLINICAL CONDITION.   [name] M.D on 07/17/2015 at 2:02 PM  Between 7am to 6pm - Pager - [phone]  After 6pm go to [email or web address] - password [email or web address] [facility]  [place] [facility]  Office  [phone]  CC: Primary care physician; No PCP Per Patient

## 2015-07-18 LAB — GLUCOSE, CAPILLARY
GLUCOSE-CAPILLARY: 188 mg/dL — AB (ref 65–99)
GLUCOSE-CAPILLARY: 215 mg/dL — AB (ref 65–99)
Glucose-Capillary: 247 mg/dL — ABNORMAL HIGH (ref 65–99)
Glucose-Capillary: 265 mg/dL — ABNORMAL HIGH (ref 65–99)

## 2015-07-18 LAB — MRSA PCR SCREENING: MRSA by PCR: NEGATIVE

## 2015-07-18 MED ORDER — MAGNESIUM CITRATE PO SOLN
1.0000 | Freq: Once | ORAL | Status: DC
  Filled 2015-07-18: qty 296

## 2015-07-18 MED ORDER — LIVING BETTER WITH HEART FAILURE BOOK
Freq: Once | Status: AC
  Administered 2015-07-18: 20:00:00

## 2015-07-18 MED ORDER — INSULIN ASPART 100 UNIT/ML ~~LOC~~ SOLN
5.0000 [IU] | Freq: Three times a day (TID) | SUBCUTANEOUS | Status: DC
  Administered 2015-07-18 – 2015-07-22 (×10): 5 [IU] via SUBCUTANEOUS
  Filled 2015-07-18 (×8): qty 5

## 2015-07-18 MED ORDER — FLEET ENEMA 7-19 GM/118ML RE ENEM
1.0000 | ENEMA | Freq: Once | RECTAL | Status: AC
  Administered 2015-07-18: 1 via RECTAL

## 2015-07-18 NOTE — Progress Notes (Signed)
ANTICOAGULATION CONSULT NOTE - Follow up  Pharmacy Consult for Apixaban Indication: atrial fibrillation  No Known Allergies  Patient Measurements: Height: 6' (182.9 cm) Weight: (!) 311 lb 8.2 oz (141.3 kg) IBW/kg (Calculated) : 77.6   Vital Signs: Temp: 97.6 F (36.4 C) (02/13 0800) Temp Source: Oral (02/13 0800) BP: 113/79 mmHg (02/13 1000) Pulse Rate: 122 (02/13 1000)  Labs:  Recent Labs  07/16/15 0428 07/17/15 0522  CREATININE 1.61* 1.53*   Lab Results  Component Value Date   HGB 12.1* 07/15/2015   Lab Results  Component Value Date   PLT 140* 07/15/2015   Estimated Creatinine Clearance: 72.1 mL/min (by C-G formula based on Cr of 1.53).   Assessment: Pharmacy consulted to dose and monitor apixiban for 65 yo male with new onset AFib.   Goal of Therapy:  Monitor platelets by anticoagulation protocol: Yes   Plan:  Will continue apixaban 5 mg bid.  Will order CBC for tomorrow AM (need q3 days per policy).  Pharmacy will continue to follow.   Rocky Morel 07/18/2015,2:54 PM

## 2015-07-18 NOTE — Progress Notes (Signed)
Chaplain rounded in the unit and provided a compassionate presence to the patient. Visited and said a prayer. Minerva Fester [phone]

## 2015-07-18 NOTE — Progress Notes (Signed)
Patient: [name] / Admit Date: 07/08/2015 / Date of Encounter: 07/18/2015, 9:11 AM   Subjective:  Dobutamine has been gradually increased to 5 g. He feels the same. Dyspnea is overall improved. Leg edema and also seems to be better. [name] trying to cut down on fluid intake  Review of Systems: Review of Systems  Constitutional: Negative.   Respiratory: Positive for shortness of breath.   Cardiovascular: Positive for leg swelling.  Gastrointestinal:       ABD distention  Musculoskeletal: Negative.   Neurological: Negative.   Psychiatric/Behavioral: Negative.   All other systems reviewed and are negative.   Objective: Telemetry: Sinus tachycardia, rate 110 bpm Physical Exam: Blood pressure 98/66, pulse 110, temperature 97.6 F (36.4 C), temperature source Oral, resp. rate 11, height 6' (1.829 m), weight 311 lb 8.2 oz (141.3 kg), SpO2 100 %. Body mass index is 42.24 kg/(m^2). General: Morbidly obese Caucasian male appearing in no acute distress. Head: Normocephalic, atraumatic. Neck: Supple without bruits, JVD unable to be assessed secondary to body habitus. Lungs: Resp regular and unlabored, decreased breath sounds at the bases bilaterally. Heart: RRR,  Tachycardia, S1, S2, no S3, S4, or murmur; no rub.  Abdomen: Soft, non-tender, with normoactive bowel sounds. No hepatomegaly. No rebound/guarding. No obvious abdominal masses. Appears distended. Extremities: No clubbing or cyanosis, 2+ edema bilaterally.  Neuro: Alert and oriented X 3. Moves all extremities spontaneously. Psych: Normal affect.   Intake/Output Summary (Last 24 hours) at 07/18/15 0911 Last data filed at 07/18/15 0755  Gross per 24 hour  Intake 1369.78 ml  Output   3300 ml  Net -1930.22 ml    Inpatient Medications:  . amiodarone  200 mg Oral BID  . apixaban  5 mg Oral BID  . aspirin  81 mg Oral Daily  . atorvastatin  40 mg Oral q1800  . docusate sodium  100 mg Oral BID  . furosemide  40 mg  Intravenous 3 times per day  . insulin aspart  0-5 Units Subcutaneous QHS  . insulin aspart  0-9 Units Subcutaneous TID WC  . insulin aspart  3 Units Subcutaneous TID WC  . insulin glargine  20 Units Subcutaneous QHS  . loratadine  10 mg Oral QHS  . potassium chloride  40 mEq Oral Daily  . sodium chloride flush  3 mL Intravenous Q12H  . spironolactone  25 mg Oral QHS   Infusions:  . DOBUTamine 4.995 mcg/kg/min (07/18/15 0600)  . DOPamine Stopped (07/16/15 2321)    Labs:  Recent Labs  07/16/15 0428 07/17/15 0522  NA 135 135  K 4.0 4.5  CL 94* 93*  CO2 34* 34*  GLUCOSE 109* 195*  BUN 36* 35*  CREATININE 1.61* 1.53*  CALCIUM 8.9 8.8*   No results for input(s): AST, ALT, ALKPHOS, BILITOT, PROT, ALBUMIN in the last 72 hours. No results for input(s): WBC, NEUTROABS, HGB, HCT, MCV, PLT in the last 72 hours. No results for input(s): CKTOTAL, CKMB, TROPONINI in the last 72 hours. Invalid input(s): POCBNP No results for input(s): HGBA1C in the last 72 hours.   Weights: Filed Weights   07/16/15 0400 07/17/15 0500 07/18/15 0500  Weight: 308 lb 10.3 oz (140 kg) 310 lb 10.1 oz (140.9 kg) 311 lb 8.2 oz (141.3 kg)     Radiology/Studies:  Dg Chest 2 View  07/08/2015  CLINICAL DATA:  Patient with a history of chf and cardiomyopathy. Patient states that since December he has developed shortness of breath, bilateral leg swelling  and vertigo. Patient states that over the last week the symptoms have become worse. EXAM: CHEST  2 VIEW COMPARISON:  None. FINDINGS: Normal cardiac silhouette. There is central venous congestion. No effusion, infiltrate pneumothorax. No pleural fluid. No acute osseous abnormality. IMPRESSION: Central venous congestion.  No pleural floor no overt edema. Electronically Signed   By: Suzy Bouchard M.D.   On: 07/08/2015 20:43   Dg Chest Port 1 View  07/16/2015  CLINICAL DATA:  Check central line position.  Initial encounter. EXAM: PORTABLE CHEST 1 VIEW COMPARISON:   Chest radiograph performed 07/13/2015 FINDINGS: A right PICC is noted ending about the distal SVC. The lungs are well-aerated. Vascular congestion is noted. There is no evidence of focal opacification, pleural effusion or pneumothorax. The cardiomediastinal silhouette is mildly enlarged. No acute osseous abnormalities are seen. IMPRESSION: 1. Right PICC noted ending about the distal SVC. 2. Vascular congestion and mild cardiomegaly. Lungs remain grossly clear. Electronically Signed   By: Garald Balding M.D.   On: 07/16/2015 03:34   Dg Chest Port 1 View  07/13/2015  CLINICAL DATA:  PICC line placement EXAM: PORTABLE CHEST 1 VIEW COMPARISON:  Chest x-ray dated 07/08/2015. FINDINGS: Interval placement of a right-sided PICC line with tip well-positioned in the lower SVC, near the expected location of the cavoatrial junction. Cardiomegaly is stable. Lungs are clear. No pleural effusion seen. No pneumothorax seen. IMPRESSION: 1. Right-sided PICC line placement, well positioned with tip near the expected location of the cavoatrial junction. 2. Stable cardiomegaly. Electronically Signed   By: Franki Cabot M.D.   On: 07/13/2015 18:19     Assessment and Plan  65 y.o. male   1. Acute on chronic combined systolic and diastolic heart failure  EF of 15-20% with moderate mitral regurgitation and moderate pulmonary hypertension and severely dilated inferior vena cava.  on low dose dobutamine.  Continue Dobutamine and current dose of IV Lasix. Consider weaning off dobutamine starting tomorrow.   2. Newly diagnosed atrial fibrillation with rapid ventricular response - converted to NSR on 07/11/2015 - This patients CHA2DS2-VASc Score and unadjusted Ischemic Stroke Rate (% per year) is equal to 4.8 % stroke rate/year from a score of 4 (CHF, HTN, DM, Vascular). Continue Eliquis for anticoagulation.  - continue with PO Amiodarone 200 mg BID. BB on hold due to Inotropic therapy. Telemetry reviewed, sinus tachycardia  with no atrial fibrillation  3. Coronary artery disease with previous PCI/ Elevated Troponin - Given the decline in his ejection fraction (previously reported as 25% in 2013),  will need right and left cardiac catheterization once his volume status improves  Perhaps could be done when we feel he is euvolemic, likely as an outpatient.   4. Morbid obesity - BMI of 42.8 on admission. - Likely has sleep apnea as well, needs CPAP  5. 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 Peaked at 2.13 on 07/13/2015.  Continues to trend down, now 1.5  6. Uncontrolled Type 2 DM - A1c 11.0 this admission Drinks significant volume of suite tea, this has been stopped, needs diabetes education  Strict diet     Signed, Kathlyn Sacramento, MD  [facility] 07/18/2015, 9:11 AM

## 2015-07-18 NOTE — Plan of Care (Signed)
Problem: Safety: Goal: Ability to remain free from injury will improve Outcome: Progressing Pt uses call light to get up to bathroom. Yellow socks on patient before any transfers.   Problem: Activity: Goal: Capacity to carry out activities will improve Outcome: Completed/Met Date Met:  07/18/15 Pt states he can feel relief from the swelling due to his heart failure.  Problem: Cardiac: Goal: Ability to achieve and maintain adequate cardiopulmonary perfusion will improve Outcome: Progressing Pt on 78mg of dobutamine. Will titrate down tomorrow. Pt currently ST on telemetry.  Problem: Education: Goal: Ability to demonstrate managment of disease process will improve Outcome: Progressing Informed patient about the importance of diet control and fluid management.

## 2015-07-18 NOTE — Progress Notes (Signed)
[place] at [place] NAME: [name]    MR#:  [id number]  DATE OF BIRTH:  06-29-1950  SUBJECTIVE:   Dobutamine gtt dose has slowly been advanced.  Diuresing o.k. With increased Lasix dose.  Constipated.   REVIEW OF SYSTEMS:    Review of Systems  Constitutional: Negative for fever and chills.  HENT: Negative for congestion and tinnitus.   Eyes: Negative for blurred vision and double vision.  Respiratory: Negative for cough, shortness of breath and wheezing.   Cardiovascular: Negative for chest pain, orthopnea and PND.  Gastrointestinal: Positive for constipation. Negative for nausea, vomiting, abdominal pain and diarrhea.  Genitourinary: Negative for dysuria and hematuria.  Neurological: Negative for dizziness, sensory change and focal weakness.  All other systems reviewed and are negative.   Nutrition: Heart healthy Tolerating Diet: yes Tolerating PT: Await Eval.    DRUG ALLERGIES:  No Known Allergies  VITALS:  Blood pressure 113/74, pulse 121, temperature 97.9 F (36.6 C), temperature source Oral, resp. rate 21, height 6' (1.829 m), weight 141.3 kg (311 lb 8.2 oz), SpO2 92 %.  PHYSICAL EXAMINATION:   Physical Exam  GENERAL:  65 y.o.-year-old obese patient sitting up in chair in no acute distress.  EYES: Pupils equal, round, reactive to light and accommodation. No scleral icterus. Extraocular muscles intact.  HEENT: Head atraumatic, normocephalic. Oropharynx and nasopharynx clear.  NECK:  Supple, no jugular venous distention. No thyroid enlargement, no tenderness.  LUNGS: Normal breath sounds bilaterally, no wheezing, rales, rhonchi. No use of accessory muscles of respiration.  CARDIOVASCULAR: S1, S2 normal. No murmurs, rubs, or gallops.  ABDOMEN: Soft, nontender, nondistended. Bowel sounds present. No organomegaly or mass.  EXTREMITIES: No cyanosis, clubbing, + 1-2 edema b/l.     NEUROLOGIC: Cranial nerves II  through XII are intact. No focal Motor or sensory deficits b/l.  G PSYCHIATRIC: The patient is alert and oriented x 3.  SKIN: No obvious rash, lesion, or ulcer.    LABORATORY PANEL:   CBC  Recent Labs Lab 07/15/15 0428  WBC 5.4  HGB 12.1*  HCT 36.9*  PLT 140*   ------------------------------------------------------------------------------------------------------------------  Chemistries   Recent Labs Lab 07/17/15 0522  NA 135  K 4.5  CL 93*  CO2 34*  GLUCOSE 195*  BUN 35*  CREATININE 1.53*  CALCIUM 8.8*   ------------------------------------------------------------------------------------------------------------------  Cardiac Enzymes No results for input(s): TROPONINI in the last 168 hours. ------------------------------------------------------------------------------------------------------------------  RADIOLOGY:  No results found.   ASSESSMENT AND PLAN:   65 year old male with past medical history of chronic systolic CHF, obstructive sleep apnea, diabetes type 2 that, patient, chronic kidney disease stage III, hypertension, atrial fibrillation who presented to the hospital shortness of breath and noted to be in congestive heart failure.  #1 acute on chronic systolic CHF-patient has underlying severe cardiomyopathy of a 15-20%. -Continue diuresis with IV Lasix and and higher dose of dobutamine. Appreciate cardiology input they attempted and will attempt to wean Dobutamine tomorrow.  -Patient is about 8 L negative since admission. Cont. Fluid restriction.   #2 chronic atrial fibrillation-continue amiodarone, rate controlled. -Continue Eliquis  #3. Bilateral lower extremity cellulitis-this seems more like chronic venous stasis. Off Keflex now.  - afebrile.    #4 type 2 diabetes without complication-continue Lantus, NovoLog with meals - Blood Sugars stable  #5. HyperLipidemia-continue atorvastatin.  #6. Anxiety - cont. Xanax.    #7 Constipation - was  given dulcolax and Miralax without much response.  - will order Fleets  enema today and monitor.   All the records are reviewed and case discussed with Care Management/Social Workerr. Management plans discussed with the patient, family and they are in agreement.  CODE STATUS: Full   DVT Prophylaxis: Eliquis  TOTAL TIME TAKING CARE OF THIS PATIENT: 25 minutes.   POSSIBLE D/C IN 2-3 DAYS, DEPENDING ON CLINICAL CONDITION.   [name] M.D on 07/18/2015 at 3:54 PM  Between 7am to 6pm - Pager - [phone]  After 6pm go to [email or web address] - password [email or web address] [facility]  [facility]  Office  [phone]  CC: Primary care physician; No PCP Per Patient

## 2015-07-18 NOTE — Progress Notes (Signed)
Inpatient Diabetes Program Recommendations  AACE/ADA: New Consensus Statement on Inpatient Glycemic Control (2015)  Target Ranges:  Prepandial:   less than 140 mg/dL      Peak postprandial:   less than 180 mg/dL (1-2 hours)      Critically ill patients:  140 - 180 mg/dL  Results for [name], [name] (MRN [id number]) as of 07/18/2015 09:48  Ref. Range 07/17/2015 07:30 07/17/2015 11:39 07/17/2015 16:59 07/17/2015 22:39 07/18/2015 07:46  Glucose-Capillary Latest Ref Range: 65-99 mg/dL 186 (H) 219 (H) 291 (H) 274 (H) 188 (H)   Results for [name], [name] (MRN [id number]) as of 07/18/2015 09:48  Ref. Range 07/08/2015 20:11  Hemoglobin A1C Latest Ref Range: 4.0-6.0 % 11.0 (H)   Review of Glycemic Control  Diabetes history: DM2 Outpatient Diabetes medications: Glipizide 30 mg QAM, Glipizide 20 mg QHS, Metformin 1000 mg BID Current orders for Inpatient glycemic control: Lantus 20 units QHS, Novolog 0-9 units TID with meals, Novolog 0-5 units QHS, Novolog 3 units TID with meals for meal coverage  Inpatient Diabetes Program Recommendations: Insulin - Meal Coverage: Post prandial glucose continues to be elevated despite Novolog 3 units TID with meals. Please consider increasing meal coverage to Novolog 7 units TID with meals. HgbA1C: A1C was 11% on 07/08/15 indicating an average glucose of 269 mg/dl over the past 2-3 months. Patient will need to follow up with PCP regarding glycemic control.  Thanks, Barnie Alderman, RN, MSN, CDE Diabetes Coordinator Inpatient Diabetes Program [phone] (Team Pager from [place] to [place]) [phone] ([facility] office) [phone] [facility] office) [phone] [facility] office)

## 2015-07-18 NOTE — Progress Notes (Signed)
PT Cancellation Note  Patient Details Name: [name] MRN: [id number] DOB: 12-10-1950   Cancelled Treatment:    Reason Eval/Treat Not Completed:  (Per chart review, order not updated as instructed to therapist 2/10.  Clarified with current RN who reports patient remains appropriate for PT; orders updated for continued care this date.  However, patient just receiving enema; RN recommends re-attempt at later time as result.)   [name] H. Owens Shark, PT, DPT, NCS 07/18/2015, 3:34 PM [phone]

## 2015-07-18 NOTE — Care Management (Signed)
Met with patient to see his medical progress. [name] still requiring O2 which is new. He has no PCP and lives in [place]. He states he has been independent with mobility. He would like to remain with [facility] pulmonary physicians. Advised patient to seek PCP also- he agreed. List of home health agencies left with patient. I have faxed MAR to Medication Mgt for review as he is not an [facility] resident. He will have health insurance 08/03/15. He lives with a roommate [facility] however patient pays [facility] rent to stay there. Patient states his is able to drive.

## 2015-07-18 NOTE — Progress Notes (Signed)
At 0923, Pt had 13 beats of V-Tach. Pt states he did not feel and palpitations and had no idea anything had happened if it wasn't for the monitor alarming. Printed off event and showed Dr. Fletcher Anon. He stated if pt has anymore episodes to let Cardiology know.

## 2015-07-19 DIAGNOSIS — I5021 Acute systolic (congestive) heart failure: Secondary | ICD-10-CM

## 2015-07-19 LAB — BASIC METABOLIC PANEL
ANION GAP: 6 (ref 5–15)
BUN: 35 mg/dL — AB (ref 6–20)
CHLORIDE: 96 mmol/L — AB (ref 101–111)
CO2: 35 mmol/L — ABNORMAL HIGH (ref 22–32)
Calcium: 8.8 mg/dL — ABNORMAL LOW (ref 8.9–10.3)
Creatinine, Ser: 1.32 mg/dL — ABNORMAL HIGH (ref 0.61–1.24)
GFR calc Af Amer: >60 mL/min (ref >60)
GFR, EST NON AFRICAN AMERICAN: 56 mL/min — AB (ref >60)
GLUCOSE: 137 mg/dL — AB (ref 65–99)
POTASSIUM: 4 mmol/L (ref 3.5–5.1)
SODIUM: 137 mmol/L (ref 135–145)

## 2015-07-19 LAB — CBC
HCT: 33.8 % — ABNORMAL LOW (ref 40.0–52.0)
HEMOGLOBIN: 11.3 g/dL — AB (ref 13.0–18.0)
MCH: 32.1 pg (ref 26.0–34.0)
MCHC: 33.4 g/dL (ref 32.0–36.0)
MCV: 96.2 fL (ref 80.0–100.0)
Platelets: 116 10*3/uL — ABNORMAL LOW (ref 150–440)
RBC: 3.51 MIL/uL — AB (ref 4.40–5.90)
RDW: 15.1 % — ABNORMAL HIGH (ref 11.5–14.5)
WBC: 5.8 10*3/uL (ref 3.8–10.6)

## 2015-07-19 LAB — GLUCOSE, CAPILLARY
GLUCOSE-CAPILLARY: 127 mg/dL — AB (ref 65–99)
GLUCOSE-CAPILLARY: 178 mg/dL — AB (ref 65–99)
GLUCOSE-CAPILLARY: 122 mg/dL — AB (ref 65–99)
Glucose-Capillary: 231 mg/dL — ABNORMAL HIGH (ref 65–99)

## 2015-07-19 MED ORDER — FUROSEMIDE 10 MG/ML IJ SOLN
80.0000 mg | Freq: Two times a day (BID) | INTRAMUSCULAR | Status: DC
  Administered 2015-07-19 – 2015-07-21 (×5): 80 mg via INTRAVENOUS
  Filled 2015-07-19 (×5): qty 8

## 2015-07-19 MED ORDER — HYDROCORTISONE ACE-PRAMOXINE 1-1 % RE FOAM
1.0000 | Freq: Three times a day (TID) | RECTAL | Status: DC | PRN
  Administered 2015-07-19: 1 via RECTAL
  Filled 2015-07-19: qty 10

## 2015-07-19 NOTE — Progress Notes (Signed)
Pt lying in bed resting comfortably no SOB noted. Pt has [place] @3l  tolerating well.. Pt denies pain or distress . Pt had good UOP this shift ,. Continued on dobutamine at 61mcg.further assessment via flowsheet

## 2015-07-19 NOTE — Plan of Care (Signed)
Problem: Cardiac: Goal: Ability to achieve and maintain adequate cardiopulmonary perfusion will improve Outcome: Progressing Dobutamine titrated down to 2.5 mcg/kg/minand lasix increased to 80 mg twice daily per Cardiologist.  Problem: Education: Goal: Ability to demonstrate managment of disease process will improve Outcome: Progressing Patient able to verbalize treatments of heart failure has information packet at bedside that he has been reviewing as well. Goal: Ability to verbalize understanding of medication therapies will improve Outcome: Progressing Patient verbalized goal of dobuatmine infusion and lasix injections.

## 2015-07-19 NOTE — Progress Notes (Signed)
[place] at [place] NAME: [name]    MR#:  [id number]  DATE OF BIRTH:  1950/12/19  SUBJECTIVE:   Dobutamine gtt lowered.  Having BM's now.  Lasix dose advanced.  Feels better.  No other complaints.   REVIEW OF SYSTEMS:    Review of Systems  Constitutional: Negative for fever and chills.  HENT: Negative for congestion and tinnitus.   Eyes: Negative for blurred vision and double vision.  Respiratory: Negative for cough, shortness of breath and wheezing.   Cardiovascular: Negative for chest pain, orthopnea and PND.  Gastrointestinal: Negative for nausea, vomiting, abdominal pain, diarrhea and constipation.  Genitourinary: Negative for dysuria and hematuria.  Neurological: Negative for dizziness, sensory change and focal weakness.  All other systems reviewed and are negative.   Nutrition: Heart healthy Tolerating Diet: yes Tolerating PT: Await Eval.    DRUG ALLERGIES:  No Known Allergies  VITALS:  Blood pressure 107/80, pulse 117, temperature 97.9 F (36.6 C), temperature source Oral, resp. rate 17, height 6' (1.829 m), weight 137.6 kg (303 lb 5.7 oz), SpO2 97 %.  PHYSICAL EXAMINATION:   Physical Exam  GENERAL:  65 y.o.-year-old obese patient sitting up in chair in no acute distress.  EYES: Pupils equal, round, reactive to light and accommodation. No scleral icterus. Extraocular muscles intact.  HEENT: Head atraumatic, normocephalic. Oropharynx and nasopharynx clear.  NECK:  Supple, no jugular venous distention. No thyroid enlargement, no tenderness.  LUNGS: Normal breath sounds bilaterally, no wheezing, rales, rhonchi. No use of accessory muscles of respiration.  CARDIOVASCULAR: S1, S2 normal. No murmurs, rubs, or gallops.  ABDOMEN: Soft, nontender, nondistended. Bowel sounds present. No organomegaly or mass.  EXTREMITIES: No cyanosis, clubbing, + 1-2 edema b/l.     NEUROLOGIC: Cranial nerves II through XII are  intact. No focal Motor or sensory deficits b/l.  G PSYCHIATRIC: The patient is alert and oriented x 3.  SKIN: No obvious rash, lesion, or ulcer. Signs of chronic venous stasis on lower extremities bilaterally.   LABORATORY PANEL:   CBC  Recent Labs Lab 07/19/15 0446  WBC 5.8  HGB 11.3*  HCT 33.8*  PLT 116*   ------------------------------------------------------------------------------------------------------------------  Chemistries   Recent Labs Lab 07/19/15 0446  NA 137  K 4.0  CL 96*  CO2 35*  GLUCOSE 137*  BUN 35*  CREATININE 1.32*  CALCIUM 8.8*   ------------------------------------------------------------------------------------------------------------------  Cardiac Enzymes No results for input(s): TROPONINI in the last 168 hours. ------------------------------------------------------------------------------------------------------------------  RADIOLOGY:  No results found.   ASSESSMENT AND PLAN:   65 year old male with past medical history of chronic systolic CHF, obstructive sleep apnea, diabetes type 2 that, patient, chronic kidney disease stage III, hypertension, atrial fibrillation who presented to the hospital shortness of breath and noted to be in congestive heart failure.  #1 acute on chronic systolic CHF-patient has underlying severe cardiomyopathy of a 15-20%. - about 9-10 L since admission.  -Continue diuresis with IV Lasix and dose advanced by Cards.  Dobutamine lowered due to tachycardia.   - Appreciate cardiology help.  -Cont. Fluid restriction.   #2 chronic atrial fibrillation-continue amiodarone, Tachy due to Dobutamine which is being lowered. -Continue Eliquis  #3. Bilateral lower extremity cellulitis- ruled out.  - chronic venous stasis. Off Keflex now.   #4 type 2 diabetes without complication-continue Lantus, NovoLog with meals - Blood Sugars stable  #5. HyperLipidemia-continue atorvastatin.  #6. Anxiety - cont. Xanax.     #7 Constipation - now resolved w/ Fleets  enema - cont. To monitor.   All the records are reviewed and case discussed with Care Management/Social Worker. Management plans discussed with the patient, family and they are in agreement.  CODE STATUS: Full   DVT Prophylaxis: Eliquis  TOTAL TIME TAKING CARE OF THIS PATIENT: 25 minutes.   POSSIBLE D/C IN 2-3 DAYS, DEPENDING ON CLINICAL CONDITION.   [name] M.D on 07/19/2015 at 3:03 PM  Between 7am to 6pm - Pager - [phone]  After 6pm go to [email or web address] - password [email or web address] [facility]  [facility]  Office  [phone]  CC: Primary care physician; No PCP Per Patient

## 2015-07-19 NOTE — Progress Notes (Signed)
Gave report to [place], Therapist, sports and relinquished pt. Care @ 2300

## 2015-07-19 NOTE — Progress Notes (Signed)
PT Cancellation Note  Patient Details Name: [name] MRN: [id number] DOB: [birth year]   Cancelled Treatment:    Reason Eval/Treat Not Completed: Patient declined, no reason specified;Other (comment). Treatment attempted this afternoon. Pt is on the bedside commode and notes he has been suffering with loose stool and constant need to use commode since last night. Pt request PT held today and attempted tomorrow.    Erline Levine Bishop 07/19/2015, 2:28 PM

## 2015-07-19 NOTE — Progress Notes (Signed)
PT Cancellation Note  Patient Details Name: [name] MRN: [id number] DOB: [birth year]   Cancelled Treatment:    Reason Eval/Treat Not Completed: Other (comment) (Treatment session attempted.  Patient currently on [facility]; expresses need to 'work' a little longer.  Will re-attempt at later time/date as patient available and medically appropriate.)   Shalika Arntz H. Owens Shark, PT, DPT, NCS 07/19/2015, 11:19 AM [phone]

## 2015-07-19 NOTE — Progress Notes (Addendum)
Patient: [name] / Admit Date: 07/08/2015 / Date of Encounter: 07/19/2015, 8:14 AM   Subjective:   feels better today  IO incomplete 2/2 urine in commode with BM   Review of Systems: Review of Systems  Constitutional: Negative.   Respiratory: Positive for shortness of breath.   Cardiovascular: Positive for leg swelling.  Gastrointestinal:       ABD distention  Musculoskeletal: Negative.   Neurological: Negative.   Psychiatric/Behavioral: Negative.   All other systems reviewed and are negative.   Objective: Telemetry: Sinus tachycardia, rate 110 bpm Physical Exam: Blood pressure 111/91, pulse 106, temperature 97.6 F (36.4 C), temperature source Oral, resp. rate 15, height 6' (1.829 m), weight 303 lb 5.7 oz (137.6 kg), SpO2 100 %. Body mass index is 41.13 kg/(m^2). General: Morbidly obese Caucasian male appearing in no acute distress. Head: Normocephalic, atraumatic. Neck: Supple without bruits, JVD unable to be assessed secondary to body habitus. Lungs: Resp regular and unlabored, decreased breath sounds at the bases bilaterally. Heart: RRR,  Tachycardia, S1, S2, no S3, S4, or murmur; no rub.  Abdomen: Soft, non-tender, with normoactive bowel sounds. No hepatomegaly. No rebound/guarding. No obvious abdominal masses. Appears distended. Extremities: No clubbing or cyanosis, 2+ edema bilaterally.  Neuro: Alert and oriented X 3. Moves all extremities spontaneously. Psych: Normal affect.   Intake/Output Summary (Last 24 hours) at 07/19/15 0814 Last data filed at 07/19/15 0600  Gross per 24 hour  Intake 1088.8 ml  Output   1575 ml  Net -486.2 ml    Inpatient Medications:  . amiodarone  200 mg Oral BID  . apixaban  5 mg Oral BID  . aspirin  81 mg Oral Daily  . atorvastatin  40 mg Oral q1800  . docusate sodium  100 mg Oral BID  . furosemide  40 mg Intravenous 3 times per day  . insulin aspart  0-5 Units Subcutaneous QHS  . insulin aspart  0-9 Units  Subcutaneous TID WC  . insulin aspart  5 Units Subcutaneous TID WC  . insulin glargine  20 Units Subcutaneous QHS  . loratadine  10 mg Oral QHS  . magnesium citrate  1 Bottle Oral Once  . potassium chloride  40 mEq Oral Daily  . sodium chloride flush  3 mL Intravenous Q12H  . spironolactone  25 mg Oral QHS   Infusions:  . DOBUTamine 4.995 mcg/kg/min (07/19/15 0600)    Labs:  Recent Labs  07/17/15 0522 07/19/15 0446  NA 135 137  K 4.5 4.0  CL 93* 96*  CO2 34* 35*  GLUCOSE 195* 137*  BUN 35* 35*  CREATININE 1.53* 1.32*  CALCIUM 8.8* 8.8*   No results for input(s): AST, ALT, ALKPHOS, BILITOT, PROT, ALBUMIN in the last 72 hours.  Recent Labs  07/19/15 0446  WBC 5.8  HGB 11.3*  HCT 33.8*  MCV 96.2  PLT 116*   No results for input(s): CKTOTAL, CKMB, TROPONINI in the last 72 hours. Invalid input(s): POCBNP No results for input(s): HGBA1C in the last 72 hours.   Weights: Filed Weights   07/17/15 0500 07/18/15 0500 07/19/15 0600  Weight: 310 lb 10.1 oz (140.9 kg) 311 lb 8.2 oz (141.3 kg) 303 lb 5.7 oz (137.6 kg)     Radiology/Studies:  Dg Chest 2 View  07/08/2015  CLINICAL DATA:  Patient with a history of chf and cardiomyopathy. Patient states that since December he has developed shortness of breath, bilateral leg swelling and vertigo. Patient states that over the last  week the symptoms have become worse. EXAM: CHEST  2 VIEW COMPARISON:  None. FINDINGS: Normal cardiac silhouette. There is central venous congestion. No effusion, infiltrate pneumothorax. No pleural fluid. No acute osseous abnormality. IMPRESSION: Central venous congestion.  No pleural floor no overt edema. Electronically Signed   By: Suzy Bouchard M.D.   On: 07/08/2015 20:43   Dg Chest Port 1 View  07/16/2015  CLINICAL DATA:  Check central line position.  Initial encounter. EXAM: PORTABLE CHEST 1 VIEW COMPARISON:  Chest radiograph performed 07/13/2015 FINDINGS: A right PICC is noted ending about the  distal SVC. The lungs are well-aerated. Vascular congestion is noted. There is no evidence of focal opacification, pleural effusion or pneumothorax. The cardiomediastinal silhouette is mildly enlarged. No acute osseous abnormalities are seen. IMPRESSION: 1. Right PICC noted ending about the distal SVC. 2. Vascular congestion and mild cardiomegaly. Lungs remain grossly clear. Electronically Signed   By: Garald Balding M.D.   On: 07/16/2015 03:34   Dg Chest Port 1 View  07/13/2015  CLINICAL DATA:  PICC line placement EXAM: PORTABLE CHEST 1 VIEW COMPARISON:  Chest x-ray dated 07/08/2015. FINDINGS: Interval placement of a right-sided PICC line with tip well-positioned in the lower SVC, near the expected location of the cavoatrial junction. Cardiomegaly is stable. Lungs are clear. No pleural effusion seen. No pneumothorax seen. IMPRESSION: 1. Right-sided PICC line placement, well positioned with tip near the expected location of the cavoatrial junction. 2. Stable cardiomegaly. Electronically Signed   By: Franki Cabot M.D.   On: 07/13/2015 18:19     Assessment and Plan  65 y.o. male   1. Acute on chronic combined systolic and diastolic heart failure  EF of 15-20% with moderate mitral regurgitation and moderate pulmonary hypertension and severely dilated inferior vena cava.  on low dose dobutamine.   Still massively volume overloaded  .   2. Newly diagnosed atrial fibrillation with rapid ventricular response - converted to NSR on 07/11/2015 - This patients CHA2DS2-VASc Score and unadjusted Ischemic Stroke Rate (% per year) is equal to 4.8 % stroke rate/year from a score of 4 (CHF, HTN, DM, Vascular). Continue Eliquis for anticoagulation.  - continue with PO Amiodarone 200 mg BID. BB on hold due to Inotropic therapy. Telemetry reviewed, sinus tachycardia with no atrial fibrillation  3. Coronary artery disease with previous PCI/ Elevated Troponin - Given the decline in his ejection fraction  (previously reported as 25% in 2013),  will need right and left cardiac catheterization once his volume status improves  Perhaps could be done when we feel he is euvolemic, likely as an outpatient.   4. Morbid obesity - BMI of 42.8 on admission. - Likely has sleep apnea as well, needs CPAP  5. Acute on chronic Stage 3 CKD  Likely cardiorenal syndrome  Continues to improve  6. Uncontrolled Type 2 DM - A1c 11.0 this admission    Sinus tachycardia prob 2/2 dobutatmie  Volume overloaded  Renal insufficiency  Stop asa now on apixoban   Will change lasix 80 bid and give dose of metalzaone in am if not vigorous diuresis    Will decrease dobutatmine given tachy  NEEDS SLEEP STUDY        07/19/2015, 8:14 AM

## 2015-07-19 NOTE — Progress Notes (Signed)
Patient complaining of hemorrhoid pain. States normally uses aloe vera gel with lidocaine ("like for sunburns") while at home. RN called MD, Dr. Verdell Carmine, and confirmed with pharmacy that the hospital does not stock that medication. MD ordered proctofoam HC instead.

## 2015-07-20 LAB — BASIC METABOLIC PANEL
Anion gap: 10 (ref 5–15)
BUN: 40 mg/dL — AB (ref 6–20)
CALCIUM: 8.8 mg/dL — AB (ref 8.9–10.3)
CO2: 31 mmol/L (ref 22–32)
CREATININE: 1.6 mg/dL — AB (ref 0.61–1.24)
Chloride: 95 mmol/L — ABNORMAL LOW (ref 101–111)
GFR calc Af Amer: 51 mL/min — ABNORMAL LOW (ref >60)
GFR, EST NON AFRICAN AMERICAN: 44 mL/min — AB (ref >60)
GLUCOSE: 185 mg/dL — AB (ref 65–99)
Potassium: 4.1 mmol/L (ref 3.5–5.1)
Sodium: 136 mmol/L (ref 135–145)

## 2015-07-20 LAB — GLUCOSE, CAPILLARY
GLUCOSE-CAPILLARY: 170 mg/dL — AB (ref 65–99)
Glucose-Capillary: 71 mg/dL (ref 65–99)
Glucose-Capillary: 223 mg/dL — ABNORMAL HIGH (ref 65–99)
Glucose-Capillary: 145 mg/dL — ABNORMAL HIGH (ref 65–99)

## 2015-07-20 MED ORDER — HYDROCORTISONE 2.5 % RE CREA
TOPICAL_CREAM | Freq: Three times a day (TID) | RECTAL | Status: DC
  Administered 2015-07-21 – 2015-07-23 (×3): via RECTAL
  Administered 2015-07-24: 1 via RECTAL
  Administered 2015-07-24 – 2015-07-29 (×9): via RECTAL
  Filled 2015-07-20: qty 28.35

## 2015-07-20 MED ORDER — POTASSIUM CHLORIDE CRYS ER 20 MEQ PO TBCR
20.0000 meq | EXTENDED_RELEASE_TABLET | Freq: Every day | ORAL | Status: DC
  Administered 2015-07-21 – 2015-08-01 (×11): 20 meq via ORAL
  Filled 2015-07-20 (×11): qty 1

## 2015-07-20 MED ORDER — HYDROCORTISONE ACETATE 25 MG RE SUPP
25.0000 mg | Freq: Once | RECTAL | Status: DC
  Filled 2015-07-20: qty 1

## 2015-07-20 NOTE — Progress Notes (Signed)
Problem: Cardiac Goal: Pt will achieve and maintain adequate cardiopulmonary perfusion.  Outcome: Progressing Dobutamine remains at 2.48mcg/kg/min with lasix 2x daily. Pt was able to function on Room air for most of the day. Currently on 4L.  Problem Education Goal: Ability to verbalize an understanding of medication therapies will improve. Outcome: Progressing Pt can verbalize the reason he is taking medications and the goal of the current treatments.

## 2015-07-20 NOTE — Progress Notes (Signed)
Pt rested throughout night, VSS, remained on 3L while sleeping, no complaints of pain.

## 2015-07-20 NOTE — Progress Notes (Signed)
ANTICOAGULATION CONSULT NOTE - Follow up  Pharmacy Consult for Apixaban Indication: atrial fibrillation  No Known Allergies  Patient Measurements: Height: 6' (182.9 cm) Weight: (!) 308 lb 3.3 oz (139.8 kg) IBW/kg (Calculated) : 77.6   Vital Signs: Temp: 97.7 F (36.5 C) (02/15 1400) Temp Source: Oral (02/15 1400) BP: 105/80 mmHg (02/15 1900) Pulse Rate: 110 (02/15 1900)  Labs:  Recent Labs  07/19/15 0446 07/20/15 1031  HGB 11.3*  --   HCT 33.8*  --   PLT 116*  --   CREATININE 1.32* 1.60*   Lab Results  Component Value Date   HGB 11.3* 07/19/2015   Lab Results  Component Value Date   PLT 116* 07/19/2015   Estimated Creatinine Clearance: 68.5 mL/min (by C-G formula based on Cr of 1.6).   Assessment: Pharmacy consulted to dose and monitor apixiban for 65 yo male with new onset AFib.   Goal of Therapy:  Monitor platelets by anticoagulation protocol: Yes   Plan:  Will continue apixaban 5 mg bid.  Will order CBC for tomorrow AM (need q3 days per policy).  Pharmacy will continue to follow.   [name] L 07/20/2015,7:40 PM

## 2015-07-20 NOTE — Progress Notes (Signed)
Pt remains in stable condition and pain free.   Report given to oncoming nurse.

## 2015-07-20 NOTE — Progress Notes (Signed)
   07/20/15 1900  Clinical Encounter Type  Visited With Patient  Visit Type Initial  Referral From Chaplain  Consult/Referral To [place] assisted with the completion of a HPOA and filed it accordantly.   Chaplain Clent Damore Ext: [phone]

## 2015-07-20 NOTE — Progress Notes (Signed)
Nutrition Follow-up    INTERVENTION:     Meals and Snacks: Cater to patient preferences within dietary restrictions Education: pt has not been receptive to diet education   NUTRITION DIAGNOSIS:   Limited adherence to nutrition-related recommendations related to chronic illness as evidenced by per patient/family report (pt with poor diet despite education, plans to eat fried chicken and french fries as first meal once he leaves hospital).  GOAL:   Patient will meet greater than or equal to 90% of their needs  MONITOR:    (Energy Intake, Anthropometrics, Electrolyte/Renal Profile, Digestive System)  REASON FOR ASSESSMENT:   Diagnosis    ASSESSMENT:   Pt remains on dobutamine drip, still with significant leg swelling, EF 15-20%, newly diagnosed afib with RVR, possible cardiac cath. Now on dietary fluid restriction  Diet Order:  Diet heart healthy/carb modified Room service appropriate?: Yes; Fluid consistency:: Thin; Fluid restriction:: 1500 mL Fluid   Energy Intake: appetite is good, pt eating 73% of meals on average   Recent Labs Lab 07/17/15 0522 07/19/15 0446 07/20/15 1031  NA 135 137 136  K 4.5 4.0 4.1  CL 93* 96* 95*  CO2 34* 35* 31  BUN 35* 35* 40*  CREATININE 1.53* 1.32* 1.60*  CALCIUM 8.8* 8.8* 8.8*  GLUCOSE 195* 137* 185*    Glucose Profile:  Recent Labs  07/19/15 2237 07/20/15 0730 07/20/15 1130  GLUCAP 122* 71 223*   Meds: ss novolog, lasix, lantus, novolog with meals   Height:   Ht Readings from Last 1 Encounters:  07/17/15 6' (1.829 m)    Weight:   Wt Readings from Last 1 Encounters:  07/19/15 303 lb 5.7 oz (137.6 kg)    Filed Weights   07/17/15 0500 07/18/15 0500 07/19/15 0600  Weight: 310 lb 10.1 oz (140.9 kg) 311 lb 8.2 oz (141.3 kg) 303 lb 5.7 oz (137.6 kg)    BMI:  Body mass index is 41.13 kg/(m^2).  LOW Care Level  Kerman Passey MS, [place], LDN [phone] Pager  [phone] Weekend/On-Call Pager

## 2015-07-20 NOTE — Progress Notes (Signed)
Physical Therapy Treatment Patient Details Name: [name] MRN: [id number] DOB: 10-Feb-1951 Today's Date: 07/20/2015    History of Present Illness Patient is a 65 y/o male that presents with orthopnea, nocturnal dyspnea, and LE swelling determined to have acute on chronic CHF. He was found to be in a-fib on admission and was started on amioderone drip. Became hypotensive on amio drip, which was dc'd.     PT Comments    Pt initially reluctant to participate with PT; but notes he needs to; therefore, agreeable. Pt notes he is having difficulty with pain from hemorrhoids due to bowel issues over the past week. Pt also notes he is waiting lab results to know whether or not doctor will proceed with heart catheterization today. Pt participates well in long sit and seated exercises with short rest periods between sets. Pt does participate in limited stand exercise/ambulation as well. Heart rate ranges from 113-122 beats per minute with activity. Continue PT to progress strength, endurance and ambulation for improved overall functional mobility and cardiopulmonary health.   Follow Up Recommendations  Home health PT     Equipment Recommendations       Recommendations for Other Services       Precautions / Restrictions Restrictions Weight Bearing Restrictions: No    Mobility  Bed Mobility               General bed mobility comments: Not tested; up in chair  Transfers Overall transfer level: Modified independent Equipment used: Rolling walker (2 wheeled)             General transfer comment: sit to/from stand with decreased speed, but good hand placement and ability. Pt notes all exercise/activity uncomfortable due to hemorrhoids  Ambulation/Gait Ambulation/Gait assistance: Supervision Ambulation Distance (Feet): 10 Feet (initiated; pt did not wish to leave room at that point) Assistive device: Rolling walker (2 wheeled) Gait Pattern/deviations: WFL(Within Functional  Limits)     General Gait Details: short distance followed by some stand/march in place. Increasing discomfort in bottom. Heart rate between 113-122 bpm. O2 saturation stable 96-97% on room air. No complaints of exertion.    Stairs            Wheelchair Mobility    Modified Rankin (Stroke Patients Only)       Balance                                    Cognition Arousal/Alertness: Awake/alert Behavior During Therapy: WFL for tasks assessed/performed Overall Cognitive Status: Within Functional Limits for tasks assessed                      Exercises General Exercises - Lower Extremity Ankle Circles/Pumps: AROM;Both;20 reps (long sit) Quad Sets: Strengthening;Both;20 reps (long sit) Gluteal Sets:  (deferred due to bottom soreness) Short Arc Quad: AROM;Both;20 reps (long sit) Long Arc Quad: AROM;Both;20 reps;Seated Heel Slides: AROM;Both;20 reps (long sit) Hip ABduction/ADduction: AROM;Both;20 reps (long sit) Hip Flexion/Marching: AROM;Both;20 reps;Seated;Standing (2 sets of 10 each) Other Exercises Other Exercises: all exercises performed in 2 sets of 10 Other Exercises: educated pt on proper breathing with all exercises and abdominal isolation with breathing    General Comments        Pertinent Vitals/Pain Pain Assessment:  (bottom due to hemmrhoids )    Home Living  Prior Function            PT Goals (current goals can now be found in the care plan section) Progress towards PT goals: Progressing toward goals    Frequency  Min 2X/week    PT Plan Current plan remains appropriate    Co-evaluation             End of Session   Activity Tolerance: Patient tolerated treatment well;Patient limited by fatigue (limited by deconditioning/pain from hemorrhoids) Patient left: in chair;with call bell/phone within reach     Time: 1111-1140 PT Time Calculation (min) (ACUTE ONLY): 29 min  Charges:   $Gait Training: 8-22 mins $Therapeutic Exercise: 8-22 mins                    G Codes:      Charlaine Dalton 07/20/2015, 12:08 PM

## 2015-07-20 NOTE — Progress Notes (Signed)
Patient: [name] / Admit Date: 07/08/2015 / Date of Encounter: 07/20/2015, 9:20 AM   Subjective: Constipation, small BM yesterday, he reports no bowel movement in 6 days Dobutamine dose decreased yesterday in half, felt like he was suffocating when dose was decreased acutely, has settled down now Still with significant leg swelling Heart rate down 10 bpm with low-dose dobutamine Urine output has moderately decreased despite increased Lasix dosing   Review of Systems: Review of Systems  Constitutional: Negative.   Respiratory: Positive for shortness of breath.   Cardiovascular: Positive for leg swelling.  Gastrointestinal:       ABD distention  Musculoskeletal: Negative.   Neurological: Negative.   Psychiatric/Behavioral: Negative.   All other systems reviewed and are negative.   Objective: Telemetry: Sinus tachycardia, rate 110 bpm Physical Exam: Blood pressure 119/65, pulse 105, temperature 97.7 F (36.5 C), temperature source Oral, resp. rate 22, height 6' (1.829 m), weight 303 lb 5.7 oz (137.6 kg), SpO2 100 %. Body mass index is 41.13 kg/(m^2). General: Morbidly obese Caucasian male appearing in no acute distress. Head: Normocephalic, atraumatic. Neck: Supple without bruits, JVD unable to be assessed secondary to body habitus. Lungs: Resp regular and unlabored, decreased breath sounds at the bases bilaterally. Heart: RRR,  Tachycardia, S1, S2, no S3, S4, or murmur; no rub.  Abdomen: Soft, non-tender, with normoactive bowel sounds. No hepatomegaly. No rebound/guarding. No obvious abdominal masses. Appears distended. Extremities: No clubbing or cyanosis, 1+ edema bilaterally.  Neuro: Alert and oriented X 3. Moves all extremities spontaneously. Psych: Normal affect.   Intake/Output Summary (Last 24 hours) at 07/20/15 0920 Last data filed at 07/20/15 0300  Gross per 24 hour  Intake  933.6 ml  Output   1400 ml  Net -466.4 ml    Inpatient Medications:  .  amiodarone  200 mg Oral BID  . apixaban  5 mg Oral BID  . atorvastatin  40 mg Oral q1800  . docusate sodium  100 mg Oral BID  . furosemide  80 mg Intravenous BID  . insulin aspart  0-5 Units Subcutaneous QHS  . insulin aspart  0-9 Units Subcutaneous TID WC  . insulin aspart  5 Units Subcutaneous TID WC  . insulin glargine  20 Units Subcutaneous QHS  . loratadine  10 mg Oral QHS  . magnesium citrate  1 Bottle Oral Once  . potassium chloride  40 mEq Oral Daily  . sodium chloride flush  3 mL Intravenous Q12H  . spironolactone  25 mg Oral QHS   Infusions:  . DOBUTamine 2.5 mcg/kg/min (07/19/15 0857)    Labs:  Recent Labs  07/19/15 0446  NA 137  K 4.0  CL 96*  CO2 35*  GLUCOSE 137*  BUN 35*  CREATININE 1.32*  CALCIUM 8.8*   No results for input(s): AST, ALT, ALKPHOS, BILITOT, PROT, ALBUMIN in the last 72 hours.  Recent Labs  07/19/15 0446  WBC 5.8  HGB 11.3*  HCT 33.8*  MCV 96.2  PLT 116*   No results for input(s): CKTOTAL, CKMB, TROPONINI in the last 72 hours. Invalid input(s): POCBNP No results for input(s): HGBA1C in the last 72 hours.   Weights: Filed Weights   07/17/15 0500 07/18/15 0500 07/19/15 0600  Weight: 310 lb 10.1 oz (140.9 kg) 311 lb 8.2 oz (141.3 kg) 303 lb 5.7 oz (137.6 kg)     Radiology/Studies:  Dg Chest 2 View  07/08/2015  CLINICAL DATA:  Patient with a history of chf and cardiomyopathy. Patient states  that since December he has developed shortness of breath, bilateral leg swelling and vertigo. Patient states that over the last week the symptoms have become worse. EXAM: CHEST  2 VIEW COMPARISON:  None. FINDINGS: Normal cardiac silhouette. There is central venous congestion. No effusion, infiltrate pneumothorax. No pleural fluid. No acute osseous abnormality. IMPRESSION: Central venous congestion.  No pleural floor no overt edema. Electronically Signed   By: Suzy Bouchard M.D.   On: 07/08/2015 20:43   Dg Chest Port 1 View  07/16/2015   CLINICAL DATA:  Check central line position.  Initial encounter. EXAM: PORTABLE CHEST 1 VIEW COMPARISON:  Chest radiograph performed 07/13/2015 FINDINGS: A right PICC is noted ending about the distal SVC. The lungs are well-aerated. Vascular congestion is noted. There is no evidence of focal opacification, pleural effusion or pneumothorax. The cardiomediastinal silhouette is mildly enlarged. No acute osseous abnormalities are seen. IMPRESSION: 1. Right PICC noted ending about the distal SVC. 2. Vascular congestion and mild cardiomegaly. Lungs remain grossly clear. Electronically Signed   By: Garald Balding M.D.   On: 07/16/2015 03:34   Dg Chest Port 1 View  07/13/2015  CLINICAL DATA:  PICC line placement EXAM: PORTABLE CHEST 1 VIEW COMPARISON:  Chest x-ray dated 07/08/2015. FINDINGS: Interval placement of a right-sided PICC line with tip well-positioned in the lower SVC, near the expected location of the cavoatrial junction. Cardiomegaly is stable. Lungs are clear. No pleural effusion seen. No pneumothorax seen. IMPRESSION: 1. Right-sided PICC line placement, well positioned with tip near the expected location of the cavoatrial junction. 2. Stable cardiomegaly. Electronically Signed   By: Franki Cabot M.D.   On: 07/13/2015 18:19     Assessment and Plan  65 y.o. male   1. Acute on chronic combined systolic and diastolic heart failure  EF of 15-20% with moderate mitral regurgitation and moderate pulmonary hypertension and severely dilated inferior vena cava.  on low dose dobutamine.  moderately fluid overloaded  --- Plan is to continue Lasix with metolazone Check BMP today Consider cardiac cath, will discuss with Dr. Fletcher Anon today (perhaps this afternoon) If no cath this week, will start low dose ace inh, enalapril 2.5 BID  2. Newly diagnosed atrial fibrillation with rapid ventricular response - converted to NSR on 07/11/2015 - This patients CHA2DS2-VASc Score and unadjusted Ischemic Stroke Rate (%  per year) is equal to 4.8 % stroke rate/year from a score of 4 (CHF, HTN, DM, Vascular). Continue Eliquis for anticoagulation.  - continue with PO Amiodarone 200 mg BID. BB on hold due to Inotropic therapy. Telemetry reviewed, sinus tachycardia with no atrial fibrillation  3. Coronary artery disease with previous PCI/ Elevated Troponin - Given the decline in his ejection fraction (previously reported as 25% in 2013),  will need right and left cardiac catheterization, will discuss timing with Dr. Fletcher Anon  4. Morbid obesity - BMI of 42.8 on admission. - Likely has sleep apnea as well, needs CPAP  5. 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 Peaked at 2.13 on 07/13/2015.  BMP pending today  6. Uncontrolled Type 2 DM - A1c 11.0 this admission Drinks significant volume of suite tea, this has been stopped, needs diabetes education  Strict diet    Total encounter time more than 35 minutes  Greater than 50% was spent in counseling and coordination of care with the patient   Signed, Esmond Plants, MD, Ph.D.  [facility] 07/20/2015, 9:20 AM

## 2015-07-20 NOTE — Progress Notes (Signed)
[place] at [place] NAME: [name]    MR#:  [id number]  DATE OF BIRTH:  1950/10/30  SUBJECTIVE:   Complaining of pain from his hemorrhoids.  Diuresing well w/ Lasix now.  Dobutamine gtt lowered by Cards.  No other complaints.    REVIEW OF SYSTEMS:    Review of Systems  Constitutional: Negative for fever and chills.  HENT: Negative for congestion and tinnitus.   Eyes: Negative for blurred vision and double vision.  Respiratory: Negative for cough, shortness of breath and wheezing.   Cardiovascular: Negative for chest pain, orthopnea and PND.  Gastrointestinal: Negative for nausea, vomiting, abdominal pain, diarrhea and constipation.  Genitourinary: Negative for dysuria and hematuria.  Neurological: Negative for dizziness, sensory change and focal weakness.  All other systems reviewed and are negative.   Nutrition: Heart healthy Tolerating Diet: yes Tolerating PT: Await Eval.    DRUG ALLERGIES:  No Known Allergies  VITALS:  Blood pressure 120/90, pulse 113, temperature 97.7 F (36.5 C), temperature source Oral, resp. rate 16, height 6' (1.829 m), weight 139.8 kg (308 lb 3.3 oz), SpO2 99 %.  PHYSICAL EXAMINATION:   Physical Exam  GENERAL:  65 y.o.-year-old obese patient sitting in bed in no acute distress.  EYES: Pupils equal, round, reactive to light and accommodation. No scleral icterus. Extraocular muscles intact.  HEENT: Head atraumatic, normocephalic. Oropharynx and nasopharynx clear.  NECK:  Supple, no jugular venous distention. No thyroid enlargement, no tenderness.  LUNGS: Normal breath sounds bilaterally, no wheezing, rales, rhonchi. No use of accessory muscles of respiration.  CARDIOVASCULAR: S1, S2 normal. No murmurs, rubs, or gallops.  ABDOMEN: Soft, nontender, nondistended. Bowel sounds present. No organomegaly or mass.  EXTREMITIES: No cyanosis, clubbing, + 1-2 edema b/l.     NEUROLOGIC: Cranial nerves  II through XII are intact. No focal Motor or sensory deficits b/l.   PSYCHIATRIC: The patient is alert and oriented x 3.  SKIN: No obvious rash, lesion, or ulcer. Signs of chronic venous stasis on lower extremities bilaterally.   LABORATORY PANEL:   CBC  Recent Labs Lab 07/19/15 0446  WBC 5.8  HGB 11.3*  HCT 33.8*  PLT 116*   ------------------------------------------------------------------------------------------------------------------  Chemistries   Recent Labs Lab 07/20/15 1031  NA 136  K 4.1  CL 95*  CO2 31  GLUCOSE 185*  BUN 40*  CREATININE 1.60*  CALCIUM 8.8*   ------------------------------------------------------------------------------------------------------------------  Cardiac Enzymes No results for input(s): TROPONINI in the last 168 hours. ------------------------------------------------------------------------------------------------------------------  RADIOLOGY:  No results found.   ASSESSMENT AND PLAN:   65 year old male with past medical history of chronic systolic CHF, obstructive sleep apnea, diabetes type 2 that, patient, chronic kidney disease stage III, hypertension, atrial fibrillation who presented to the hospital shortness of breath and noted to be in congestive heart failure.  #1 acute on chronic systolic CHF-patient has underlying severe cardiomyopathy of a 15-20%. - greater than 10 L since admission.  -Continue diuresis with IV Lasix and dose advanced by Cards.  Dobutamine lowered due to tachycardia.   - Appreciate cardiology help.  -Cont. Fluid restriction.   #2 chronic atrial fibrillation-continue amiodarone, Tachy due to Dobutamine which is being lowered. -Continue Eliquis  #3. Bilateral lower extremity cellulitis- ruled out.  - likely chronic venous stasis. Off Keflex now.   #4 type 2 diabetes without complication-continue Lantus, NovoLog with meals - Blood Sugars stable  #5. HyperLipidemia-continue atorvastatin.  #6.  Anxiety - cont. Xanax.    #7  Constipation - now resolved w/ Fleets enema - now having some irritation from hemorrhoids and will start on Anusol cream/suppository and monitor.   #8 Thrombocytopenia - mild and follow counts.  No acute bleeding. Cont. eliquis for now.    All the records are reviewed and case discussed with Care Management/Social Worker. Management plans discussed with the patient, family and they are in agreement.  CODE STATUS: Full   DVT Prophylaxis: Eliquis  TOTAL TIME TAKING CARE OF THIS PATIENT: 30 minutes.   POSSIBLE D/C IN 2-3 DAYS, DEPENDING ON CLINICAL CONDITION.   [name] M.D on 07/20/2015 at 4:11 PM  Between 7am to 6pm - Pager - [phone]  After 6pm go to [email or web address] - password [email or web address] [facility]  [facility]  Office  [phone]  CC: Primary care physician; No PCP Per Patient

## 2015-07-21 LAB — CBC
HEMATOCRIT: 34.6 % — AB (ref 40.0–52.0)
HEMOGLOBIN: 11.4 g/dL — AB (ref 13.0–18.0)
MCH: 31.3 pg (ref 26.0–34.0)
MCHC: 32.8 g/dL (ref 32.0–36.0)
MCV: 95.6 fL (ref 80.0–100.0)
Platelets: 127 10*3/uL — ABNORMAL LOW (ref 150–440)
RBC: 3.62 MIL/uL — ABNORMAL LOW (ref 4.40–5.90)
RDW: 14.7 % — AB (ref 11.5–14.5)
WBC: 5.2 10*3/uL (ref 3.8–10.6)

## 2015-07-21 LAB — GLUCOSE, CAPILLARY
GLUCOSE-CAPILLARY: 120 mg/dL — AB (ref 65–99)
GLUCOSE-CAPILLARY: 130 mg/dL — AB (ref 65–99)
GLUCOSE-CAPILLARY: 234 mg/dL — AB (ref 65–99)
Glucose-Capillary: 160 mg/dL — ABNORMAL HIGH (ref 65–99)
Glucose-Capillary: 217 mg/dL — ABNORMAL HIGH (ref 65–99)
Glucose-Capillary: 265 mg/dL — ABNORMAL HIGH (ref 65–99)

## 2015-07-21 MED ORDER — FUROSEMIDE 10 MG/ML IJ SOLN
60.0000 mg | Freq: Two times a day (BID) | INTRAMUSCULAR | Status: DC
  Administered 2015-07-22 – 2015-07-24 (×6): 60 mg via INTRAVENOUS
  Filled 2015-07-21 (×7): qty 6

## 2015-07-21 MED ORDER — LISINOPRIL 5 MG PO TABS
5.0000 mg | ORAL_TABLET | Freq: Every day | ORAL | Status: DC
  Administered 2015-07-21: 5 mg via ORAL
  Filled 2015-07-21: qty 1

## 2015-07-21 MED ORDER — ALTEPLASE 100 MG IV SOLR
2.0000 mg | Freq: Once | INTRAVENOUS | Status: AC | PRN
  Administered 2015-07-21 (×2): 2 mg
  Filled 2015-07-21 (×4): qty 2

## 2015-07-21 MED ORDER — STERILE WATER FOR INJECTION IJ SOLN
INTRAMUSCULAR | Status: AC
  Administered 2015-07-21: 4 mL
  Filled 2015-07-21: qty 10

## 2015-07-21 NOTE — Progress Notes (Signed)
Patient arrived to 2A from CCU at [place]. Blood pressure currently 82/61. Patient states he does not feel any different than he did earlier today, stating he has felt bad the whole time he has been here. Paged Dr. Anselm Jungling and MD stated to hold 1800 lasix dose. No other orders at this time but to continue to monitor. Patient is a CHF patient and cannot tolerate fluids well. Will pass on to night shift to monitor closely.

## 2015-07-21 NOTE — Progress Notes (Signed)
Physical Therapy Treatment Patient Details Name: [name] MRN: [id number] DOB: 08/03/1950 Today's Date: 07/21/2015    History of Present Illness Patient is a 65 y/o male that presents with orthopnea, nocturnal dyspnea, and LE swelling determined to have acute on chronic CHF. He was found to be in a-fib on admission and was started on amioderone drip. Became hypotensive on amio drip, which was dc'd.     PT Comments    Pt would prefer not to, but willing to participate with PT. Pt notes he is feeling better today. Pt moves slowly, but able to transfer to stand and don shorts from knees to waist without upper extremity support safely. Pt encouraged to focus on breathing as pt's O2 saturation decreases to 88% with bending forward and difficulty breathing in such position. Pt demonstrates improved ambulation distance today; O2 saturation on room air maintains above 96% and heart rate no higher than 118 beats per minute during ambulation. Pt does ambulate slowly and requires approximately 5 stand rest breaks to maintain respiration rate below 26 per minute. Continue PT for progression of strength, endurance and overall functional mobility tomorrow.   Follow Up Recommendations  Home health PT     Equipment Recommendations  Rolling walker with 5" wheels    Recommendations for Other Services       Precautions / Restrictions Restrictions Weight Bearing Restrictions: No    Mobility  Bed Mobility               General bed mobility comments: Not tested; up in chair  Transfers Overall transfer level: Modified independent Equipment used: Rolling walker (2 wheeled)             General transfer comment: No issues; able to rise shorts from knees to waist in stand independently. Pt only cued to watch breathing; pt tends to hold breath/difficulty breathing when leaning forward causing O2 saturation to drop to lowest of 88%; quickly recovers.  Ambulation/Gait Ambulation/Gait  assistance: Supervision Ambulation Distance (Feet): 200 Feet Assistive device: Rolling walker (2 wheeled) Gait Pattern/deviations: Step-through pattern Gait velocity: rediced Gait velocity interpretation: Below normal speed for age/gender General Gait Details: Ambulates well; steady/safe. Does require slow pace and 5 short stand rest periods to control respiration rate between below 26 respirations per minute   Stairs            Wheelchair Mobility    Modified Rankin (Stroke Patients Only)       Balance           Standing balance support: Bilateral upper extremity supported;No upper extremity supported Standing balance-Leahy Scale: Good                      Cognition Arousal/Alertness: Awake/alert Behavior During Therapy: WFL for tasks assessed/performed Overall Cognitive Status: Within Functional Limits for tasks assessed                      Exercises      General Comments        Pertinent Vitals/Pain      Home Living                      Prior Function            PT Goals (current goals can now be found in the care plan section) Progress towards PT goals: Progressing toward goals    Frequency  Min 2X/week    PT Plan Current plan remains  appropriate    Co-evaluation             End of Session   Activity Tolerance: Patient tolerated treatment well;Patient limited by fatigue Patient left: in chair;with call bell/phone within reach     Time: 1405-1430 PT Time Calculation (min) (ACUTE ONLY): 25 min  Charges:  $Gait Training: 8-22 mins $Therapeutic Activity: 8-22 mins                    G Codes:      Charlaine Dalton 07/21/2015, 3:04 PM

## 2015-07-21 NOTE — Progress Notes (Signed)
Patient: [name] / Admit Date: 07/08/2015 / Date of Encounter: 07/21/2015, 9:34 AM   Subjective: [name] feeling better. Weight is down to 300 lbs (was 311 few days ago). Good urine output.    Review of Systems: Review of Systems  Constitutional: Negative.   Respiratory: Positive for shortness of breath.   Cardiovascular: Positive for leg swelling.  Gastrointestinal:       ABD distention  Musculoskeletal: Negative.   Neurological: Negative.   Psychiatric/Behavioral: Negative.   All other systems reviewed and are negative.   Objective: Telemetry: Sinus tachycardia, rate 110 bpm Physical Exam: Blood pressure 111/74, pulse 81, temperature 97.9 F (36.6 C), temperature source Oral, resp. rate 12, height 6' (1.829 m), weight 300 lb 7.8 oz (136.3 kg), SpO2 99 %. Body mass index is 40.74 kg/(m^2). General: Morbidly obese Caucasian male appearing in no acute distress. Head: Normocephalic, atraumatic. Neck: Supple without bruits, JVD unable to be assessed secondary to body habitus. Lungs: Resp regular and unlabored, decreased breath sounds at the bases bilaterally. Heart: RRR,  Tachycardia, S1, S2, no S3, S4, or murmur; no rub.  Abdomen: Soft, non-tender, with normoactive bowel sounds. No hepatomegaly. No rebound/guarding. No obvious abdominal masses. Appears distended. Extremities: No clubbing or cyanosis, 1+ edema bilaterally.  Neuro: Alert and oriented X 3. Moves all extremities spontaneously. Psych: Normal affect.   Intake/Output Summary (Last 24 hours) at 07/21/15 0934 Last data filed at 07/21/15 0800  Gross per 24 hour  Intake  842.6 ml  Output   2100 ml  Net -1257.4 ml    Inpatient Medications:  . amiodarone  200 mg Oral BID  . apixaban  5 mg Oral BID  . atorvastatin  40 mg Oral q1800  . docusate sodium  100 mg Oral BID  . furosemide  60 mg Intravenous BID  . hydrocortisone   Rectal TID  . hydrocortisone  25 mg Rectal Once  . insulin aspart  0-5 Units  Subcutaneous QHS  . insulin aspart  0-9 Units Subcutaneous TID WC  . insulin aspart  5 Units Subcutaneous TID WC  . insulin glargine  20 Units Subcutaneous QHS  . lisinopril  5 mg Oral Daily  . loratadine  10 mg Oral QHS  . magnesium citrate  1 Bottle Oral Once  . potassium chloride  20 mEq Oral Daily  . sodium chloride flush  3 mL Intravenous Q12H  . spironolactone  25 mg Oral QHS   Infusions:     Labs:  Recent Labs  07/19/15 0446 07/20/15 1031  NA 137 136  K 4.0 4.1  CL 96* 95*  CO2 35* 31  GLUCOSE 137* 185*  BUN 35* 40*  CREATININE 1.32* 1.60*  CALCIUM 8.8* 8.8*   No results for input(s): AST, ALT, ALKPHOS, BILITOT, PROT, ALBUMIN in the last 72 hours.  Recent Labs  07/19/15 0446 07/21/15 0615  WBC 5.8 5.2  HGB 11.3* 11.4*  HCT 33.8* 34.6*  MCV 96.2 95.6  PLT 116* 127*   No results for input(s): CKTOTAL, CKMB, TROPONINI in the last 72 hours. Invalid input(s): POCBNP No results for input(s): HGBA1C in the last 72 hours.   Weights: Filed Weights   07/19/15 0600 07/20/15 1200 07/21/15 0500  Weight: 303 lb 5.7 oz (137.6 kg) 308 lb 3.3 oz (139.8 kg) 300 lb 7.8 oz (136.3 kg)     Radiology/Studies:  Dg Chest 2 View  07/08/2015  CLINICAL DATA:  Patient with a history of chf and cardiomyopathy. Patient states that  since December he has developed shortness of breath, bilateral leg swelling and vertigo. Patient states that over the last week the symptoms have become worse. EXAM: CHEST  2 VIEW COMPARISON:  None. FINDINGS: Normal cardiac silhouette. There is central venous congestion. No effusion, infiltrate pneumothorax. No pleural fluid. No acute osseous abnormality. IMPRESSION: Central venous congestion.  No pleural floor no overt edema. Electronically Signed   By: Suzy Bouchard M.D.   On: 07/08/2015 20:43   Dg Chest Port 1 View  07/16/2015  CLINICAL DATA:  Check central line position.  Initial encounter. EXAM: PORTABLE CHEST 1 VIEW COMPARISON:  Chest radiograph  performed 07/13/2015 FINDINGS: A right PICC is noted ending about the distal SVC. The lungs are well-aerated. Vascular congestion is noted. There is no evidence of focal opacification, pleural effusion or pneumothorax. The cardiomediastinal silhouette is mildly enlarged. No acute osseous abnormalities are seen. IMPRESSION: 1. Right PICC noted ending about the distal SVC. 2. Vascular congestion and mild cardiomegaly. Lungs remain grossly clear. Electronically Signed   By: Garald Balding M.D.   On: 07/16/2015 03:34   Dg Chest Port 1 View  07/13/2015  CLINICAL DATA:  PICC line placement EXAM: PORTABLE CHEST 1 VIEW COMPARISON:  Chest x-ray dated 07/08/2015. FINDINGS: Interval placement of a right-sided PICC line with tip well-positioned in the lower SVC, near the expected location of the cavoatrial junction. Cardiomegaly is stable. Lungs are clear. No pleural effusion seen. No pneumothorax seen. IMPRESSION: 1. Right-sided PICC line placement, well positioned with tip near the expected location of the cavoatrial junction. 2. Stable cardiomegaly. Electronically Signed   By: Franki Cabot M.D.   On: 07/13/2015 18:19     Assessment and Plan  65 y.o. male   1. Acute on chronic combined systolic and diastolic heart failure  EF of 15-20% with moderate mitral regurgitation and moderate pulmonary hypertension and severely dilated inferior vena cava.  Slowly improving.  Stop Dobutamine today. If he remains stable, can transfer to tele in afternoon.  Cardiac cath was being considered but Eliquis was not held. It is probably better also to do the right heart cath while off Dobutamine.  I resumed small dose Lisinopril and decreased lasix to 60 mg iv bid.  Will plan adding small dose Coreg tomorrow if stable.     2. Newly diagnosed atrial fibrillation with rapid ventricular response - converted to NSR on 07/11/2015 - This patients CHA2DS2-VASc Score and unadjusted Ischemic Stroke Rate (% per year) is equal to 4.8 %  stroke rate/year from a score of 4 (CHF, HTN, DM, Vascular). Continue Eliquis for anticoagulation.  - continue with PO Amiodarone 200 mg BID.  Telemetry reviewed, sinus tachycardia with no atrial fibrillation  3. Coronary artery disease with previous PCI/ Elevated Troponin - Given the decline in his ejection fraction (previously reported as 25% in 2013),  will need right and left cardiac catheterization at some point.   4. Morbid obesity - BMI of 42.8 on admission. - Likely has sleep apnea as well, needs CPAP  5. Acute on chronic Stage 3 CKD  Likely cardiorenal syndrome in addition to underlying renal dysfunction from lonstanding poorly controlled diabetes   6. Uncontrolled Type 2 DM - A1c 11.0 this admission Drinks significant volume of suite tea, this has been stopped, needs diabetes education  Strict diet     Signed, Kathlyn Sacramento, MD  [facility] 07/21/2015, 9:34 AM

## 2015-07-21 NOTE — Progress Notes (Signed)
Pt is refusing bed alarm. Pt educated on the importance on safety. Pt agreeable to call out when he needs to get out of bed. Will continue to monitor.   [name] M

## 2015-07-21 NOTE — Progress Notes (Signed)
VSS, Pt NPO after midnight, rested throughout night.

## 2015-07-21 NOTE — Progress Notes (Signed)
[place] at [place] NAME: [name]    MR#:  [id number]  DATE OF BIRTH:  Sep 12, 1950  SUBJECTIVE:   Diuresing well. Still has SOB on minimal ambulation. Leg swelling better  REVIEW OF SYSTEMS:    Review of Systems  Constitutional: Positive for malaise/fatigue. Negative for fever and chills.  HENT: Negative for congestion and tinnitus.   Eyes: Negative for blurred vision and double vision.  Respiratory: Positive for shortness of breath. Negative for cough and wheezing.   Cardiovascular: Positive for leg swelling. Negative for chest pain, orthopnea and PND.  Gastrointestinal: Negative for nausea, vomiting, abdominal pain, diarrhea and constipation.  Genitourinary: Negative for dysuria and hematuria.  Neurological: Negative for dizziness, sensory change and focal weakness.  All other systems reviewed and are negative.   Nutrition: Heart healthy Tolerating Diet: yes Tolerating PT: Await Eval.    DRUG ALLERGIES:  No Known Allergies  VITALS:  Blood pressure 111/74, pulse 81, temperature 97.9 F (36.6 C), temperature source Oral, resp. rate 12, height 6' (1.829 m), weight 136.3 kg (300 lb 7.8 oz), SpO2 99 %.  PHYSICAL EXAMINATION:   Physical Exam  GENERAL:  65 y.o.-year-old obese patient sitting in bed in no acute distress.  EYES: Pupils equal, round, reactive to light and accommodation. No scleral icterus. Extraocular muscles intact.  HEENT: Head atraumatic, normocephalic. Oropharynx and nasopharynx clear.  NECK:  Supple, no jugular venous distention. No thyroid enlargement, no tenderness.  LUNGS: Normal breath sounds bilaterally, no wheezing, rales, rhonchi. No use of accessory muscles of respiration.  CARDIOVASCULAR: S1, S2 normal. No murmurs, rubs, or gallops.  ABDOMEN: Soft, nontender, nondistended. Bowel sounds present. No organomegaly or mass.  EXTREMITIES: No cyanosis, clubbing, + 1-2 edema b/l.     NEUROLOGIC:  Cranial nerves II through XII are intact. No focal Motor or sensory deficits b/l.   PSYCHIATRIC: The patient is alert and oriented x 3.  SKIN: No obvious rash, lesion, or ulcer. Signs of chronic venous stasis on lower extremities bilaterally.   LABORATORY PANEL:   CBC  Recent Labs Lab 07/21/15 0615  WBC 5.2  HGB 11.4*  HCT 34.6*  PLT 127*   ------------------------------------------------------------------------------------------------------------------  Chemistries   Recent Labs Lab 07/20/15 1031  NA 136  K 4.1  CL 95*  CO2 31  GLUCOSE 185*  BUN 40*  CREATININE 1.60*  CALCIUM 8.8*   ------------------------------------------------------------------------------------------------------------------  Cardiac Enzymes No results for input(s): TROPONINI in the last 168 hours. ------------------------------------------------------------------------------------------------------------------  RADIOLOGY:  No results found.   ASSESSMENT AND PLAN:   65 year old male with past medical history of chronic systolic CHF, obstructive sleep apnea, diabetes type 2 that, patient, chronic kidney disease stage III, hypertension, atrial fibrillation who presented to the hospital shortness of breath and noted to be in congestive heart failure.  # Acute on chronic systolic CHF- EF 0000000. - Still unstable and needs further diuresis - 11 L Diuresis - Continue lasix. - Discussed with Dr. Fletcher Anon.  Stop dobutamin. - Start Lisinopril -Cont. Fluid restriction.   # Chronic atrial fibrillation continue amiodarone -Continue Eliquis  # Bilateral lower extremity cellulitis- ruled out.  - likely chronic venous stasis. Off Keflex now.   # Type 2 diabetes without complication continue Lantus, NovoLog with meals - Blood Sugars stable  # HyperLipidemia-continue atorvastatin.  # Anxiety - cont. Xanax.    # Constipation - now resolved w/ Fleets enema  # Thrombocytopenia - mild and follow  counts. No acute bleeding. Cont. eliquis for  now.    All the records are reviewed and case discussed with Care Management/Social Worker. Management plans discussed with the patient, family and they are in agreement.  CODE STATUS: Full   DVT Prophylaxis: Eliquis  TOTAL TIME TAKING CARE OF THIS PATIENT: 35 minutes.   POSSIBLE D/C IN 2-3 DAYS, DEPENDING ON CLINICAL CONDITION.   Hillary Bow R M.D on 07/21/2015 at 9:56 AM  Between 7am to 6pm - Pager - [phone]  After 6pm go to [email or web address] - password [email or web address] [facility]  [place] [facility]  Office  [phone]  CC: Primary care physician; No PCP Per Patient

## 2015-07-21 NOTE — Care Management (Signed)
Provided patient application to [facility] and Eliquis application.  Will make every attempt to determine level of medication assistance that will be required in the event patient discharges home over the weekend. Recap- patient will have insurance through [employer] and does not know coverage benefits or what medication coverage he will have.  Asked patient to have these completed by 2/17 morning.  Patient requiring cardiac cath but is delayed due to renal function.  he is to transfer out to 2A this day

## 2015-07-21 NOTE — Progress Notes (Signed)
Pt doesn't wish to wear CPAP for sleep 

## 2015-07-21 NOTE — Progress Notes (Signed)
Pt in stable condition. Report given to nurse on 2A.

## 2015-07-21 NOTE — Progress Notes (Addendum)
ANTICOAGULATION CONSULT NOTE - Follow up  Pharmacy Consult for Apixaban Indication: atrial fibrillation  No Known Allergies  Patient Measurements: Height: 6' (182.9 cm) Weight: (!) 300 lb 7.8 oz (136.3 kg) IBW/kg (Calculated) : 77.6   Vital Signs: Temp: 97.9 F (36.6 C) (02/16 0800) Temp Source: Oral (02/16 0800) BP: 111/79 mmHg (02/16 1054) Pulse Rate: 81 (02/16 0600)  Labs:  Recent Labs  07/19/15 0446 07/20/15 1031 07/21/15 0615  HGB 11.3*  --  11.4*  HCT 33.8*  --  34.6*  PLT 116*  --  127*  CREATININE 1.32* 1.60*  --    Lab Results  Component Value Date   HGB 11.4* 07/21/2015   Lab Results  Component Value Date   PLT 127* 07/21/2015   Estimated Creatinine Clearance: 67.6 mL/min (by C-G formula based on Cr of 1.6).   Assessment: Pharmacy consulted to dose and monitor apixiban for 65 yo male with new onset AFib.   Goal of Therapy:  Monitor platelets by anticoagulation protocol: Yes   Plan:  Will continue apixaban 5 mg bid.   Pharmacy will continue to follow.   Ulice Dash D 07/21/2015,1:25 PM

## 2015-07-21 NOTE — Progress Notes (Signed)
°   07/20/15 1500  Clinical Encounter Type  Visited With Patient  Visit Type Initial  Referral From Nurse  Consult/Referral To Chaplain  Spiritual Encounters  Spiritual Needs Other (Comment)  Pastoral care visit and AD document completion. [place]

## 2015-07-22 LAB — BASIC METABOLIC PANEL
ANION GAP: 7 (ref 5–15)
BUN: 49 mg/dL — ABNORMAL HIGH (ref 6–20)
CALCIUM: 8.8 mg/dL — AB (ref 8.9–10.3)
CO2: 32 mmol/L (ref 22–32)
CREATININE: 1.48 mg/dL — AB (ref 0.61–1.24)
Chloride: 97 mmol/L — ABNORMAL LOW (ref 101–111)
GFR, EST AFRICAN AMERICAN: 56 mL/min — AB (ref >60)
GFR, EST NON AFRICAN AMERICAN: 49 mL/min — AB (ref >60)
Glucose, Bld: 100 mg/dL — ABNORMAL HIGH (ref 65–99)
Potassium: 4.1 mmol/L (ref 3.5–5.1)
SODIUM: 136 mmol/L (ref 135–145)

## 2015-07-22 LAB — GLUCOSE, CAPILLARY
GLUCOSE-CAPILLARY: 135 mg/dL — AB (ref 65–99)
Glucose-Capillary: 76 mg/dL (ref 65–99)
Glucose-Capillary: 201 mg/dL — ABNORMAL HIGH (ref 65–99)
Glucose-Capillary: 147 mg/dL — ABNORMAL HIGH (ref 65–99)
Glucose-Capillary: 90 mg/dL (ref 65–99)

## 2015-07-22 MED ORDER — SODIUM CHLORIDE 0.9% FLUSH
10.0000 mL | Freq: Two times a day (BID) | INTRAVENOUS | Status: DC
  Administered 2015-07-23: 23 mL
  Administered 2015-07-23: 10 mL
  Administered 2015-07-24: 20 mL
  Administered 2015-07-24 – 2015-07-29 (×10): 10 mL
  Administered 2015-07-30: 20 mL
  Administered 2015-07-30: 10 mL
  Administered 2015-07-31: 20 mL
  Administered 2015-08-01: 10 mL

## 2015-07-22 MED ORDER — SODIUM CHLORIDE 0.9% FLUSH: 10.0000 mL | INTRAVENOUS | Status: DC | PRN

## 2015-07-22 MED ORDER — INSULIN ASPART 100 UNIT/ML ~~LOC~~ SOLN
7.0000 [IU] | Freq: Three times a day (TID) | SUBCUTANEOUS | Status: DC
  Administered 2015-07-22 – 2015-08-01 (×24): 7 [IU] via SUBCUTANEOUS
  Filled 2015-07-22 (×25): qty 7

## 2015-07-22 MED ORDER — LISINOPRIL 5 MG PO TABS: 2.5000 mg | ORAL_TABLET | Freq: Every day | ORAL | Status: DC

## 2015-07-22 NOTE — Progress Notes (Signed)
Pt is refusing to wear CPAP for sleep

## 2015-07-22 NOTE — Progress Notes (Signed)
ANTICOAGULATION CONSULT NOTE - Follow up  Pharmacy Consult for Apixaban Indication: atrial fibrillation  No Known Allergies  Patient Measurements: Height: 6' (182.9 cm) Weight: (!) 301 lb 14.4 oz (136.941 kg) IBW/kg (Calculated) : 77.6   Vital Signs: Temp: 97.5 F (36.4 C) (02/17 1145) Temp Source: Oral (02/17 1145) BP: 93/76 mmHg (02/17 1145) Pulse Rate: 97 (02/17 1145)  Labs:  Recent Labs  07/20/15 1031 07/21/15 0615 07/22/15 0416  HGB  --  11.4*  --   HCT  --  34.6*  --   PLT  --  127*  --   CREATININE 1.60*  --  1.48*   Lab Results  Component Value Date   HGB 11.4* 07/21/2015   Lab Results  Component Value Date   PLT 127* 07/21/2015   Estimated Creatinine Clearance: 73.2 mL/min (by C-G formula based on Cr of 1.48).   Assessment: Pharmacy consulted to dose and monitor apixiban for 65 yo male with new onset AFib.   Goal of Therapy:  Monitor platelets by anticoagulation protocol: Yes   Plan:  Will continue apixaban 5 mg bid.  2/17: Per Cardiology-Will hold Eliquis following tonight's(2/17) dose in preparation for catheterization Monday am. Discussed with Dr. Rockey Situ, and due to the patient only having 1 day of atrial fibrillation this admission, will not bridge with Heparin.  Pharmacy will continue to follow.   Emmilynn Marut A 07/22/2015,3:50 PM

## 2015-07-22 NOTE — Progress Notes (Signed)
[facility] NAME: [name]    MR#:  [id number]  DATE OF BIRTH:  [birth year]  SUBJECTIVE:   Diuresing well. Walked out in the hallway yesterday./ Leg swelling slowly improving. Off dobutamine  REVIEW OF SYSTEMS:    Review of Systems  Constitutional: Positive for malaise/fatigue. Negative for fever and chills.  HENT: Negative for congestion and tinnitus.   Eyes: Negative for blurred vision and double vision.  Respiratory: Positive for shortness of breath. Negative for cough and wheezing.   Cardiovascular: Positive for leg swelling. Negative for chest pain, orthopnea and PND.  Gastrointestinal: Negative for nausea, vomiting, abdominal pain, diarrhea and constipation.  Genitourinary: Negative for dysuria and hematuria.  Neurological: Negative for dizziness, sensory change and focal weakness.  All other systems reviewed and are negative.   Nutrition: Heart healthy Tolerating Diet: yes Tolerating PT: Await Eval.    DRUG ALLERGIES:  No Known Allergies  VITALS:  Blood pressure 93/76, pulse 97, temperature 97.5 F (36.4 C), temperature source Oral, resp. rate 18, height 6' (1.829 m), weight 136.941 kg (301 lb 14.4 oz), SpO2 100 %.  PHYSICAL EXAMINATION:   Physical Exam  GENERAL:  65 y.o.-year-old obese patient sitting in bed in no acute distress.  EYES: Pupils equal, round, reactive to light and accommodation. No scleral icterus. Extraocular muscles intact.  HEENT: Head atraumatic, normocephalic. Oropharynx and nasopharynx clear.  NECK:  Supple, no jugular venous distention. No thyroid enlargement, no tenderness.  LUNGS: Normal breath sounds bilaterally, no wheezing, rales, rhonchi. No use of accessory muscles of respiration.  CARDIOVASCULAR: S1, S2 normal. No murmurs, rubs, or gallops.  ABDOMEN: Soft, nontender, nondistended. Bowel sounds present. No organomegaly or mass.  EXTREMITIES: No cyanosis, clubbing, + 1-2  edema b/l.     NEUROLOGIC: Cranial nerves II through XII are intact. No focal Motor or sensory deficits b/l.   PSYCHIATRIC: The patient is alert and oriented x 3.  SKIN: No obvious rash, lesion, or ulcer. Signs of chronic venous stasis on lower extremities bilaterally.   LABORATORY PANEL:   CBC  Recent Labs Lab 07/21/15 0615  WBC 5.2  HGB 11.4*  HCT 34.6*  PLT 127*   ------------------------------------------------------------------------------------------------------------------  Chemistries   Recent Labs Lab 07/22/15 0416  NA 136  K 4.1  CL 97*  CO2 32  GLUCOSE 100*  BUN 49*  CREATININE 1.48*  CALCIUM 8.8*   ------------------------------------------------------------------------------------------------------------------  Cardiac Enzymes No results for input(s): TROPONINI in the last 168 hours. ------------------------------------------------------------------------------------------------------------------  RADIOLOGY:  No results found.   ASSESSMENT AND PLAN:   65 year old male with past medical history of chronic systolic CHF, obstructive sleep apnea, diabetes type 2 that, patient, chronic kidney disease stage III, hypertension, atrial fibrillation who presented to the hospital shortness of breath and noted to be in congestive heart failure.  # Acute on chronic systolic CHF- EF 0000000. - Still unstable and needs further diuresis - 12 L Diuresis - Continue lasix. - Started Lisinopril. Held due to hypotension -Cont. Fluid restriction.  -- Monitor off dobutamine. Dicussed with Dr. Rockey Situ. May have to transfer to [facility] if he doesn't diurese off dobutamine or worsens. Hopefully can d/c in 2-3 days.  # Chronic atrial fibrillation continue amiodarone -Continue Eliquis  # Bilateral lower extremity cellulitis- ruled out.  - likely chronic venous stasis. Off Keflex.  # Type 2 diabetes without complication continue Lantus, NovoLog with meals - Blood Sugars  stable  # HyperLipidemia-continue atorvastatin.  # Anxiety - cont. Xanax.    #  Constipation - Resolved  # Thrombocytopenia - mild and follow counts. No acute bleeding. Cont. eliquis for now.    All the records are reviewed and case discussed with Care Management/Social Worker. Management plans discussed with the patient, family and they are in agreement.  CODE STATUS: Full   DVT Prophylaxis: Eliquis  TOTAL TIME TAKING CARE OF THIS PATIENT: 35 minutes.   POSSIBLE D/C IN 2-3 DAYS, DEPENDING ON CLINICAL CONDITION.  Will need Nocturnal O2 at d/c as he desats when sleeping. Will need OP sleep study.   Hillary Bow R M.D on 07/22/2015 at 2:23 PM  Between 7am to 6pm - Pager - [phone]  After 6pm go to [email or web address] - password [email or web address] [facility]  [facility]  Office  [phone]  CC: Primary care physician; No PCP Per Patient

## 2015-07-22 NOTE — Progress Notes (Addendum)
Hospital Problem List     Principal Problem:   Acute on chronic combined systolic and diastolic CHF (congestive heart failure) (HCC) Active Problems:   Elevated troponin   Pulmonary hypertension (HCC)   Cardiorenal syndrome   Acute on chronic renal failure (HCC)   Morbid obesity due to excess calories (HCC)   Paroxysmal atrial fibrillation (HCC)   Orthostatic hypotension   Hyponatremia   Uncontrolled type 2 diabetes mellitus (HCC)   OSA (obstructive sleep apnea)    Patient Profile:   65 y.o. male w/ PMH of CAD (s/p remote PCI and stenting 25 years ago), ischemic cardiomyopathy (EF 15-20% this admission), obesity, OSA, HTN, and DM admitted to [facility] on 07/08/2015 with acute on chronic combined systolic and diastolic CHF.  Subjective   Did not sleep well last night. Attempted CPAP but felt like he was smothering and unable to use for extended amount of time. On 2.5L [place] overnight and this morning. Resting comfortably in his chair at the time of this encounter. Denies any chest pain or palpitations.  Inpatient Medications    . amiodarone  200 mg Oral BID  . apixaban  5 mg Oral BID  . atorvastatin  40 mg Oral q1800  . docusate sodium  100 mg Oral BID  . furosemide  60 mg Intravenous BID  . hydrocortisone   Rectal TID  . hydrocortisone  25 mg Rectal Once  . insulin aspart  0-5 Units Subcutaneous QHS  . insulin aspart  0-9 Units Subcutaneous TID WC  . insulin aspart  5 Units Subcutaneous TID WC  . insulin glargine  20 Units Subcutaneous QHS  . lisinopril  5 mg Oral Daily  . loratadine  10 mg Oral QHS  . magnesium citrate  1 Bottle Oral Once  . potassium chloride  20 mEq Oral Daily  . sodium chloride flush  3 mL Intravenous Q12H  . spironolactone  25 mg Oral QHS    Vital Signs    Filed Vitals:   07/21/15 2350 07/22/15 0336 07/22/15 0422 07/22/15 0849  BP: 94/52 109/75  95/64  Pulse: 113 104  100  Temp:  97.5 F (36.4 C)    TempSrc:  Oral    Resp:  22    Height:        Weight:   301 lb 14.4 oz (136.941 kg)   SpO2: 98% 97%  98%    Intake/Output Summary (Last 24 hours) at 07/22/15 1030 Last data filed at 07/22/15 0900  Gross per 24 hour  Intake    803 ml  Output   1150 ml  Net   -347 ml   Filed Weights   07/20/15 1200 07/21/15 0500 07/22/15 0422  Weight: 308 lb 3.3 oz (139.8 kg) 300 lb 7.8 oz (136.3 kg) 301 lb 14.4 oz (136.941 kg)    Physical Exam    General: Morbidly obese Caucasian male appearing in no acute distress. Head: Normocephalic, atraumatic.  Neck: Supple without bruits, JVD unable to be assessed secondary to body habitus. Lungs:  Resp regular and unlabored, decreased breath sounds at the bases. No audible wheezing  Heart: RRR, S1, S2, no S3, S4, or murmur; no rub. Abdomen: Soft, non-tender, non-distended with normoactive bowel sounds. No hepatomegaly. No rebound/guarding. No obvious abdominal masses. Extremities: No clubbing or cyanosis.  1+ edema bilaterally. Distal pedal pulses are 2+ bilaterally. Neuro: Alert and oriented X 3. Moves all extremities spontaneously. Psych: Normal affect.  Labs    CBC  Recent Labs  07/21/15 0615  WBC 5.2  HGB 11.4*  HCT 34.6*  MCV 95.6  PLT [id number]*   Basic Metabolic Panel  Recent Labs  07/20/15 1031 07/22/15 0416  NA 136 136  K 4.1 4.1  CL 95* 97*  CO2 31 32  GLUCOSE 185* 100*  BUN 40* 49*  CREATININE 1.60* 1.48*  CALCIUM 8.8* 8.8*     Telemetry    NSR, HR in 90's  - 100's. Frequent PVC's.   ECG    No new tracings.   Cardiac Studies and Radiology    Dg Chest Port 1 View: 07/16/2015  CLINICAL DATA:  Check central line position.  Initial encounter. EXAM: PORTABLE CHEST 1 VIEW COMPARISON:  Chest radiograph performed 07/13/2015 FINDINGS: A right PICC is noted ending about the distal SVC. The lungs are well-aerated. Vascular congestion is noted. There is no evidence of focal opacification, pleural effusion or pneumothorax. The cardiomediastinal silhouette is mildly enlarged. No  acute osseous abnormalities are seen. IMPRESSION: 1. Right PICC noted ending about the distal SVC. 2. Vascular congestion and mild cardiomegaly. Lungs remain grossly clear. Electronically Signed   By: Garald Balding M.D.   On: 07/16/2015 03:34   Echocardiogram: 07/09/2015 Study Conclusions - Procedure narrative: Transthoracic echocardiography. Image quality was poor. The study was technically difficult, as a result of poor acoustic windows and poor sound wave transmission. - Left ventricle: The cavity size was moderately dilated. There was mild concentric hypertrophy. Systolic function was severely reduced. The estimated ejection fraction was in the range of 15% to 20%. Possible akinesis of the anteroseptal, anterior, and anterolateral myocardium. The study is not technically sufficient to allow evaluation of LV diastolic function. - Mitral valve: There was moderate regurgitation. - Left atrium: The atrium was moderately dilated. - Right ventricle: The cavity size was moderately dilated. Wall thickness was normal. Systolic function was moderately reduced. - Right atrium: The atrium was mildly dilated. - Pulmonary arteries: Systolic pressure was moderately increased. PA peak pressure: 50 mm Hg (S). - Inferior vena cava: The vessel was severely dilated. Respirophasic changes in dimension were absent, severely elevated central venous pressure.  Assessment & Plan     1. Acute on chronic combined systolic and diastolic heart failure - EF of 15-20% with moderate mitral regurgitation and moderate pulmonary hypertension and severely dilated inferior vena cava.  - Slowly improving, taken off Dobutamine 07/21/2015 .  - Cardiac cath was being considered but Eliquis was not held. It is probably better also to do the right heart cath while off Dobutamine. Earliest cath would be Monday, 07/25/2015, but the patient is also looking forward to discharge soon. - will place order  for Ted hose to help with lower extremity edema. - continue Lasix 60mg  IV BID. Lisinopril held this morning due to hypotension. His BP has been 82/52 - 114/84 in the past 24 hours, therefore we will defer the addition of Coreg for now. - weight is down 15 lbs since admission with net output -11.9L. Will see how he responds to Lasix off Dobutamine today. If he does not diurese well, he will need to be seen by Dr. Haroldine Laws in [place] for further CHF evaluation and management.  2. Newly diagnosed atrial fibrillation with rapid ventricular response - converted to NSR on 07/11/2015 - This patients CHA2DS2-VASc Score and unadjusted Ischemic Stroke Rate (% per year) is equal to 4.8 % stroke rate/year from a score of 4 (CHF, HTN, DM, Vascular). Continue Eliquis for anticoagulation.  - continue with PO Amiodarone 200  mg BID.  - Telemetry shows no evidence of recurrent atrial fibrillation.  3. Coronary artery disease with previous PCI/ Elevated Troponin - Given the decline in his ejection fraction (previously reported as 25% in 2013),will need right and left cardiac catheterization at some point.  - continue statin and ACE-I. Will add low-dose BB once BP allows.  4. Morbid obesity - BMI of 42.8 on admission. - Likely has sleep apnea as well, needs CPAP  5. Acute on chronic Stage 3 CKD - Likely cardiorenal syndrome in addition to underlying renal dysfunction from lonstanding poorly controlled diabetes - creatinine peaked at 2.13 on 07/13/2015. Improved to 1.48 on 07/22/2015.  6. Uncontrolled Type 2 DM - A1c 11.0 this admission - Drinks significant volume of sweet tea and reports he wants his first meal at the time of discharge to be fried chicken - education has been provided by the Diabetes Coordinator as well as nursing staff and his providers.   7. OSA - reports his CPAP machine at home is broke. Unable to tolerate CPAP thus far this admission due to feeling like he is being smothered. - will  have nursing staff check his oxygen saturations overnight without his nasal cannula and see what his saturations are. - will likely need O2 going home for nocturnal use until he can have a repeat sleep study.  Signed, Erma Heritage , PA-C 10:30 AM 07/22/2015 Pager: [phone]    Attending Note Patient seen and examined, agree with detailed note above,  Patient presentation and plan discussed on rounds.   Dobutamine held yesterday, started on low dose ACE Significant hypotension but asymptomatic, systolics periodically into the 80s meds being held by nursing secondary to hypotension  He has ambulated around nursing station, Slow pace, Good appetite, Weight down approx 15 pounds from admission weight, Overall feels that he is improving  --would continue currents meds, lisinopril possibly later today if BP tolerates, Will need to monitor weight and renal function closely If there is weight gain and worsening rena function off dobutamine,  He may need out pt dobutamine via picc line  Needs cardiac cath monday (will stop eiquis 2 days prior, on saturday Case discussed with Dr. Fletcher Anon, scheduled for Monday AM   Greater than 50% was spent in counseling and coordination of care with patient Total encounter time 35 minutes or more   Signed: Esmond Plants  M.D., Ph.D. [facility]

## 2015-07-22 NOTE — Progress Notes (Signed)
  Spoke with Dr. Rockey Situ. Will plan for left and right heart catheterization on Monday (07/25/2015). The patient has been added to the board and will be the first case at 7:30AM.   Will hold Eliquis following tonight's dose in preparation for catheterization. Discussed with Dr. Rockey Situ, and due to the patient only having 1 day of atrial fibrillation this admission, will not bridge with Heparin.  Patient updated about this plan and is in agreement to proceed with catheterization on Monday.  Signed, Erma Heritage, PA-C 07/22/2015, 2:44 PM Pager: [phone]

## 2015-07-22 NOTE — Progress Notes (Signed)
PT Cancellation Note  Patient Details Name: Paysen Turnquist MRN: [id number] DOB: 1951/01/18   Cancelled Treatment:    Reason Eval/Treat Not Completed: Patient declined, no reason specified;Other (comment) Pt reports that he is feeling wiped out and does not wish to do any PT today.  Offered to just do light in-bed exercsies - pt still deferred.  PT held today.  Kreg Shropshire, DPT 07/22/2015, 4:26 PM

## 2015-07-22 NOTE — Progress Notes (Signed)
Inpatient Diabetes Program Recommendations  AACE/ADA: New Consensus Statement on Inpatient Glycemic Control (2015)  Target Ranges:  Prepandial:   less than 140 mg/dL      Peak postprandial:   less than 180 mg/dL (1-2 hours)      Critically ill patients:  140 - 180 mg/dL  Results for HARY, KIDO (MRN [id number]) as of 07/22/2015 09:37  Ref. Range 07/21/2015 07:29 07/21/2015 11:32 07/21/2015 16:07 07/21/2015 17:49 07/21/2015 20:48 07/21/2015 23:33 07/22/2015 07:27  Glucose-Capillary Latest Ref Range: 65-99 mg/dL 120 (H) 130 (H) Novolog 6 units 265 (H) Novolog 8 units 217 (H)  234 (H) Novolog 2 units 160 (H) Lantus 20 units 76   Review of Glycemic Control  Diabetes history: DM2 Outpatient Diabetes medications: Glipizide 30 mg QAM, Glipizide 20 mg QHS, Metformin 1000 mg BID Current orders for Inpatient glycemic control: Lantus 20 units QHS, Novolog 0-9 units TID with meals, Novolog 0-5 units QHS, Novolog 5 units TID with meals for meal coverage  Inpatient Diabetes Program Recommendations: Insulin - Meal Coverage: Please consider increasing meal coverage to 8 units TID with meals if patient is eating at least 50% of meals.  Thanks, Barnie Alderman, RN, MSN, CDE Diabetes Coordinator Inpatient Diabetes Program [phone] (Team Pager from [place] to [place]) [phone] ([facility] office) [phone] [facility] office) [phone] [facility] office)

## 2015-07-22 NOTE — Care Management (Signed)
Plan is for left and right heart catheterization on Monday (07/25/2015).

## 2015-07-23 LAB — BASIC METABOLIC PANEL
ANION GAP: 8 (ref 5–15)
BUN: 52 mg/dL — ABNORMAL HIGH (ref 6–20)
CALCIUM: 8.6 mg/dL — AB (ref 8.9–10.3)
CO2: 30 mmol/L (ref 22–32)
CREATININE: 1.6 mg/dL — AB (ref 0.61–1.24)
Chloride: 97 mmol/L — ABNORMAL LOW (ref 101–111)
GFR, EST AFRICAN AMERICAN: 51 mL/min — AB (ref >60)
GFR, EST NON AFRICAN AMERICAN: 44 mL/min — AB (ref >60)
Glucose, Bld: 142 mg/dL — ABNORMAL HIGH (ref 65–99)
Potassium: 4.2 mmol/L (ref 3.5–5.1)
Sodium: 135 mmol/L (ref 135–145)

## 2015-07-23 LAB — GLUCOSE, CAPILLARY
GLUCOSE-CAPILLARY: 128 mg/dL — AB (ref 65–99)
GLUCOSE-CAPILLARY: 236 mg/dL — AB (ref 65–99)
GLUCOSE-CAPILLARY: 124 mg/dL — AB (ref 65–99)
Glucose-Capillary: 217 mg/dL — ABNORMAL HIGH (ref 65–99)

## 2015-07-23 LAB — PLATELET COUNT: Platelets: 203 10*3/uL (ref 150–440)

## 2015-07-23 MED ORDER — OXYCODONE-ACETAMINOPHEN 5-325 MG PO TABS
1.0000 | ORAL_TABLET | ORAL | Status: DC | PRN
Start: 2015-07-23 — End: 2015-07-29
  Administered 2015-07-23 – 2015-07-28 (×8): 1 via ORAL
  Filled 2015-07-23 (×8): qty 1

## 2015-07-23 NOTE — Progress Notes (Signed)
Subjective: No CP  Still SOB  Objective: Filed Vitals:   07/22/15 2020 07/23/15 0533 07/23/15 0718 07/23/15 1127  BP: 107/78 110/74 106/74 110/81  Pulse: 99 96 89 95  Temp: 98.2 F (36.8 C)   97.4 F (36.3 C)  TempSrc:    Oral  Resp: 22 21 20 18   Height:      Weight:  135.24 kg (298 lb 2.4 oz)    SpO2: 97% 99% 98% 100%   Weight change: -1.701 kg (-3 lb 12 oz)  Intake/Output Summary (Last 24 hours) at 07/23/15 1235 Last data filed at 07/23/15 0953  Gross per 24 hour  Intake    720 ml  Output    920 ml  Net   -200 ml   Net I/O 12. 5 L negative    General: Alert, awake, oriented x3, in no acute distress Neck:  JVP is increased   Heart: Regular rate and rhythm, without murmurs, rubs +S3  Lungs: Clear to auscultation.  No rales or wheezes. Exemities:  1+  edema.   Neuro: Grossly intact, nonfocal.  Tele:  SR    Lab Results: Results for orders placed or performed during the hospital encounter of 07/08/15 (from the past 24 hour(s))  Glucose, capillary     Status: Abnormal   Collection Time: 07/22/15  4:26 PM  Result Value Ref Range   Glucose-Capillary 147 (H) 65 - 99 mg/dL   Comment 1 Notify RN   Glucose, capillary     Status: None   Collection Time: 07/22/15  9:50 PM  Result Value Ref Range   Glucose-Capillary 90 65 - 99 mg/dL   Comment 1 Notify RN   Glucose, capillary     Status: Abnormal   Collection Time: 07/22/15 10:54 PM  Result Value Ref Range   Glucose-Capillary 135 (H) 65 - 99 mg/dL   Comment 1 Notify RN   Basic metabolic panel     Status: Abnormal   Collection Time: 07/23/15  5:00 AM  Result Value Ref Range   Sodium 135 135 - 145 mmol/L   Potassium 4.2 3.5 - 5.1 mmol/L   Chloride 97 (L) 101 - 111 mmol/L   CO2 30 22 - 32 mmol/L   Glucose, Bld 142 (H) 65 - 99 mg/dL   BUN 52 (H) 6 - 20 mg/dL   Creatinine, Ser 1.60 (H) 0.61 - 1.24 mg/dL   Calcium 8.6 (L) 8.9 - 10.3 mg/dL   GFR calc non Af Amer 44 (L) >60 mL/min   GFR calc Af Amer 51 (L) >60 mL/min   Anion gap 8 5 - 15  Platelet count     Status: None   Collection Time: 07/23/15  5:00 AM  Result Value Ref Range   Platelets 203 150 - 440 K/uL  Glucose, capillary     Status: Abnormal   Collection Time: 07/23/15  7:22 AM  Result Value Ref Range   Glucose-Capillary 128 (H) 65 - 99 mg/dL  Glucose, capillary     Status: Abnormal   Collection Time: 07/23/15 11:37 AM  Result Value Ref Range   Glucose-Capillary 236 (H) 65 - 99 mg/dL    Studies/Results: No results found.  Medications: Reviewed    @PROBHOSP @ 1  Acute on chronic systolic CHF  LVEF 15 to 123456  RVEF moderately reduced  Off of dobutamine since 2/16  On IV lasix 60 bid   BP is better   Would continue IV lasix  Watch Cr    2  Atrial fib with RVR  CHADSVASc score of 4  On amio 200 bid and eliquis.  Remains in SR    3.  CAD  No sympotms to sugg angina    4  OSA  Intolerant to CPAP atprewsent    4  Renal  Cr 1.6  Up from yesterday  Hold ACE I    LOS: 15 days   Dorris Carnes 07/23/2015, 12:35 PM

## 2015-07-23 NOTE — Progress Notes (Signed)
Patient complained of pain during Am rounds. Patient stated he felt like he was had a kidney stone due to prior experiences such as the urge to pee but only urinating small amounts of concentrated urine. Md notified. Acknowledged. New orders given.

## 2015-07-23 NOTE — Progress Notes (Signed)
Patient ID: [name], male   DOB: 1950/12/14, 65 y.o.   MRN: [id number] [facility] PROGRESS NOTE  [name] [id number] DOB: 1950/08/06 DOA: 07/08/2015 PCP: No PCP Per Patient  HPI/Subjective: Patient having some pain in the left back. He has a history of kidney stones. He states his breathing is okay. Offers no complaints. Still swollen in the legs and abdomen.  Objective: Filed Vitals:   07/23/15 0718 07/23/15 1127  BP: 106/74 110/81  Pulse: 89 95  Temp:  97.4 F (36.3 C)  Resp: 20 18    Filed Weights   07/21/15 0500 07/22/15 0422 07/23/15 0533  Weight: 136.3 kg (300 lb 7.8 oz) 136.941 kg (301 lb 14.4 oz) 135.24 kg (298 lb 2.4 oz)    ROS: Review of Systems  Constitutional: Negative for fever and chills.  Eyes: Negative for blurred vision.  Respiratory: Positive for shortness of breath. Negative for cough.   Cardiovascular: Negative for chest pain.  Gastrointestinal: Negative for nausea, vomiting, abdominal pain, diarrhea and constipation.  Genitourinary: Negative for dysuria.  Musculoskeletal: Negative for joint pain.  Neurological: Negative for dizziness and headaches.   Exam: Physical Exam  Constitutional: [name] oriented to person, place, and time.  HENT:  Nose: No mucosal edema.  Mouth/Throat: No oropharyngeal exudate or posterior oropharyngeal edema.  Eyes: Conjunctivae, EOM and lids are normal. Pupils are equal, round, and reactive to light.  Neck: No JVD present. Carotid bruit is not present. No edema present. No thyroid mass and no thyromegaly present.  Cardiovascular: S1 normal and S2 normal.  Exam reveals no gallop.   No murmur heard. Pulses:      Dorsalis pedis pulses are 2+ on the right side, and 2+ on the left side.  Respiratory: No respiratory distress. He has no wheezes. He has no rhonchi. He has rales in the right lower field and the left lower field.  GI: Soft. Bowel sounds are normal. There is no tenderness.  Musculoskeletal:    Right ankle: He exhibits swelling.       Left ankle: He exhibits swelling.  Lymphadenopathy:    He has no cervical adenopathy.  Neurological: [name] alert and oriented to person, place, and time. No cranial nerve deficit.  Skin: Skin is warm. No rash noted. Nails show no clubbing.  Psychiatric: He has a normal mood and affect.      Data Reviewed: Basic Metabolic Panel:  Recent Labs Lab 07/17/15 0522 07/19/15 0446 07/20/15 1031 07/22/15 0416 07/23/15 0500  NA 135 137 136 136 135  K 4.5 4.0 4.1 4.1 4.2  CL 93* 96* 95* 97* 97*  CO2 34* 35* 31 32 30  GLUCOSE 195* 137* 185* 100* 142*  BUN 35* 35* 40* 49* 52*  CREATININE 1.53* 1.32* 1.60* 1.48* 1.60*  CALCIUM 8.8* 8.8* 8.8* 8.8* 8.6*   CBC:  Recent Labs Lab 07/19/15 0446 07/21/15 0615 07/23/15 0500  WBC 5.8 5.2  --   HGB 11.3* 11.4*  --   HCT 33.8* 34.6*  --   MCV 96.2 95.6  --   PLT 116* 127* 203    CBG:  Recent Labs Lab 07/22/15 1626 07/22/15 2150 07/22/15 2254 07/23/15 0722 07/23/15 1137  GLUCAP 147* 90 135* 128* 236*    Recent Results (from the past 240 hour(s))  MRSA PCR Screening     Status: None   Collection Time: 07/18/15  2:42 PM  Result Value Ref Range Status   MRSA by PCR NEGATIVE NEGATIVE Final  Comment:        The GeneXpert MRSA Assay (FDA approved for NASAL specimens only), is one component of a comprehensive MRSA colonization surveillance program. It is not intended to diagnose MRSA infection nor to guide or monitor treatment for MRSA infections.      Scheduled Meds: . amiodarone  200 mg Oral BID  . atorvastatin  40 mg Oral q1800  . docusate sodium  100 mg Oral BID  . furosemide  60 mg Intravenous BID  . hydrocortisone   Rectal TID  . hydrocortisone  25 mg Rectal Once  . insulin aspart  0-5 Units Subcutaneous QHS  . insulin aspart  0-9 Units Subcutaneous TID WC  . insulin aspart  7 Units Subcutaneous TID WC  . insulin glargine  20 Units Subcutaneous QHS  . loratadine  10  mg Oral QHS  . magnesium citrate  1 Bottle Oral Once  . potassium chloride  20 mEq Oral Daily  . sodium chloride flush  10-40 mL Intracatheter Q12H  . sodium chloride flush  3 mL Intravenous Q12H  . spironolactone  25 mg Oral QHS    Assessment/Plan:  1.  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 Continue IV diuresis with Lasix 60 mg IV twice a day. Continue spironolactone. Blood pressure still on the lower side and can't give beta blocker or ACE inhibitor at this point. 2. Atrial fibrillation continue amiodarone for rate control. Eliquis held for cardiac catheter on Monday. 3. Hyperlipidemia unspecified continue atorvastatin 4. Bilateral lotion the cellulitis- finished antibiotics 5. Type 2 diabetes without complication continue low-dose Lantus and sliding scale 6. Anxiety on Xanax as needed 7. History of kidney stone on the left- continue oral hydration  Code Status:     Code Status Orders        Start     Ordered   07/15/15 2355  Full code   Continuous     07/15/15 2354    Code Status History    Date Active Date Inactive Code Status Order ID Comments User Context   07/08/2015 10:28 PM 07/15/2015 11:54 PM Full Code [id number]  Demetrios Loll, MD Inpatient     Disposition Plan: To be determined likely home this coming week  Consultants:  Cardiology  Time spent: 25 minutes  [place] [place] [facility]

## 2015-07-23 NOTE — Progress Notes (Signed)
Room air. NSR. FS are stable. Takes meds ok. Up to chair and tolearted it well. Bed alarm refused. Urinal. Pt has no further concerns at this time.

## 2015-07-24 ENCOUNTER — Inpatient Hospital Stay: Payer: Medicaid Other

## 2015-07-24 LAB — GLUCOSE, CAPILLARY
GLUCOSE-CAPILLARY: 173 mg/dL — AB (ref 65–99)
GLUCOSE-CAPILLARY: 232 mg/dL — AB (ref 65–99)
GLUCOSE-CAPILLARY: 163 mg/dL — AB (ref 65–99)
Glucose-Capillary: 82 mg/dL (ref 65–99)

## 2015-07-24 LAB — BASIC METABOLIC PANEL
ANION GAP: 9 (ref 5–15)
BUN: 54 mg/dL — ABNORMAL HIGH (ref 6–20)
CALCIUM: 8.7 mg/dL — AB (ref 8.9–10.3)
CO2: 29 mmol/L (ref 22–32)
CREATININE: 1.67 mg/dL — AB (ref 0.61–1.24)
Chloride: 97 mmol/L — ABNORMAL LOW (ref 101–111)
GFR, EST AFRICAN AMERICAN: 49 mL/min — AB (ref >60)
GFR, EST NON AFRICAN AMERICAN: 42 mL/min — AB (ref >60)
Glucose, Bld: 104 mg/dL — ABNORMAL HIGH (ref 65–99)
Potassium: 4.2 mmol/L (ref 3.5–5.1)
SODIUM: 135 mmol/L (ref 135–145)

## 2015-07-24 IMAGING — CR DG ABDOMEN 2V
1 series · 4 of 4 positions shown · non-contrast
Comparison: None.

CLINICAL DATA: Left kidney stone.  Difficulty urinating.

EXAM:
ABDOMEN - 2 VIEW

[Series 1: dg abd 2 views · 0.14mm/px · 4 of 4 slices shown]
[im 1/4]
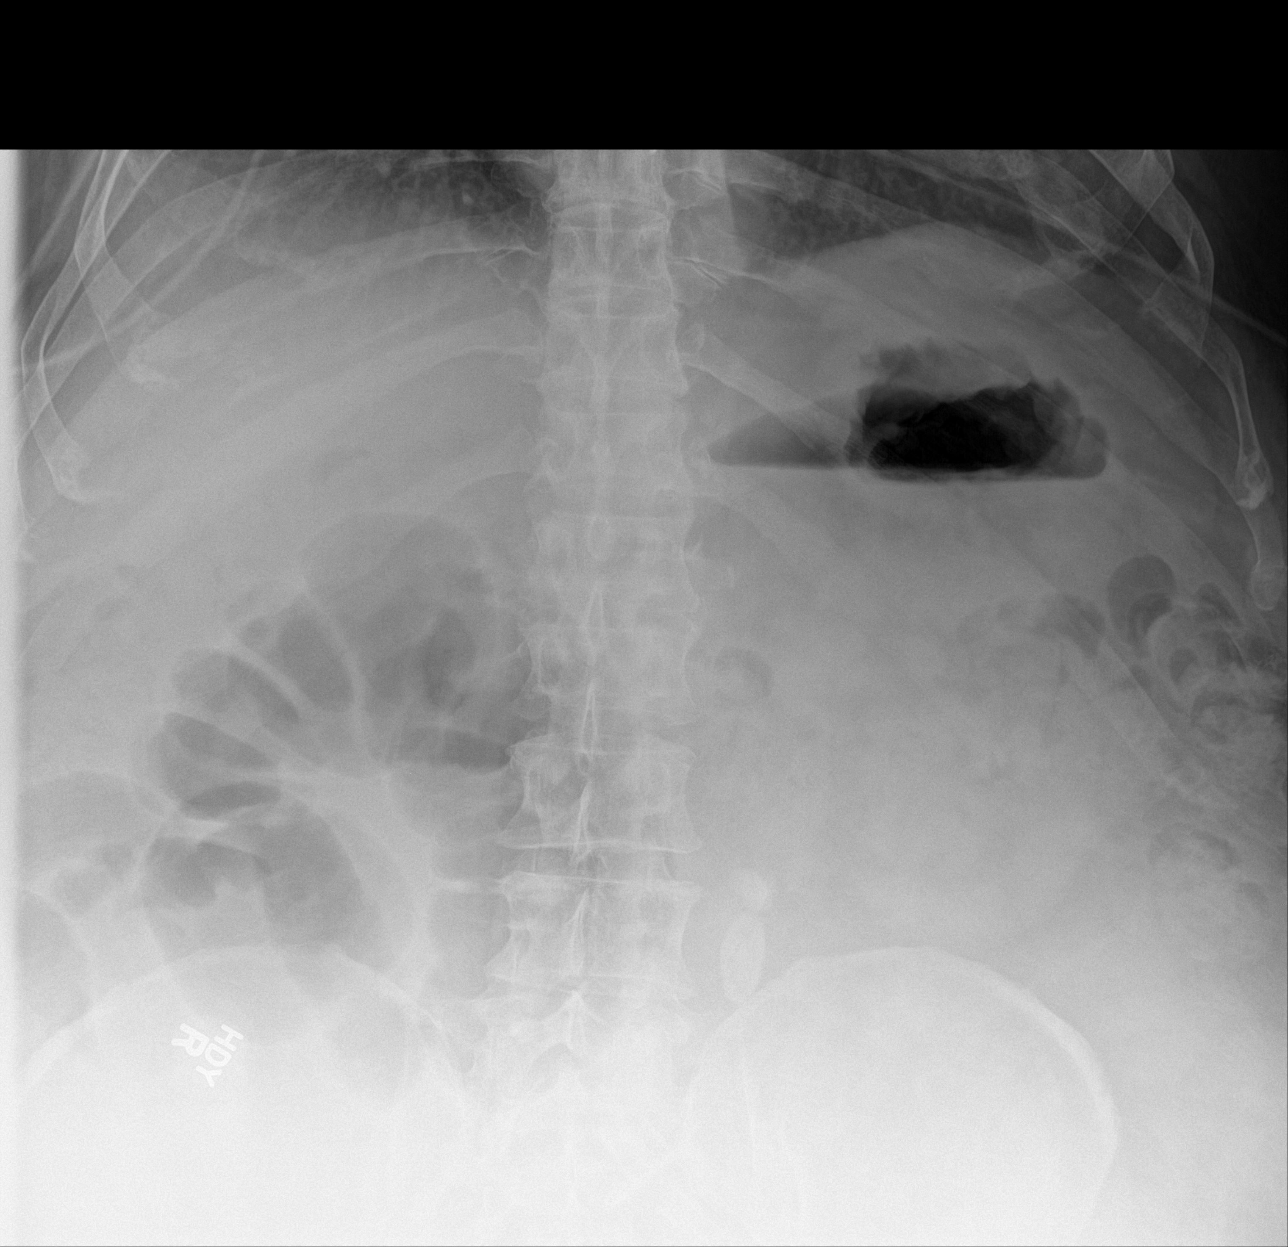
[im 2/4]
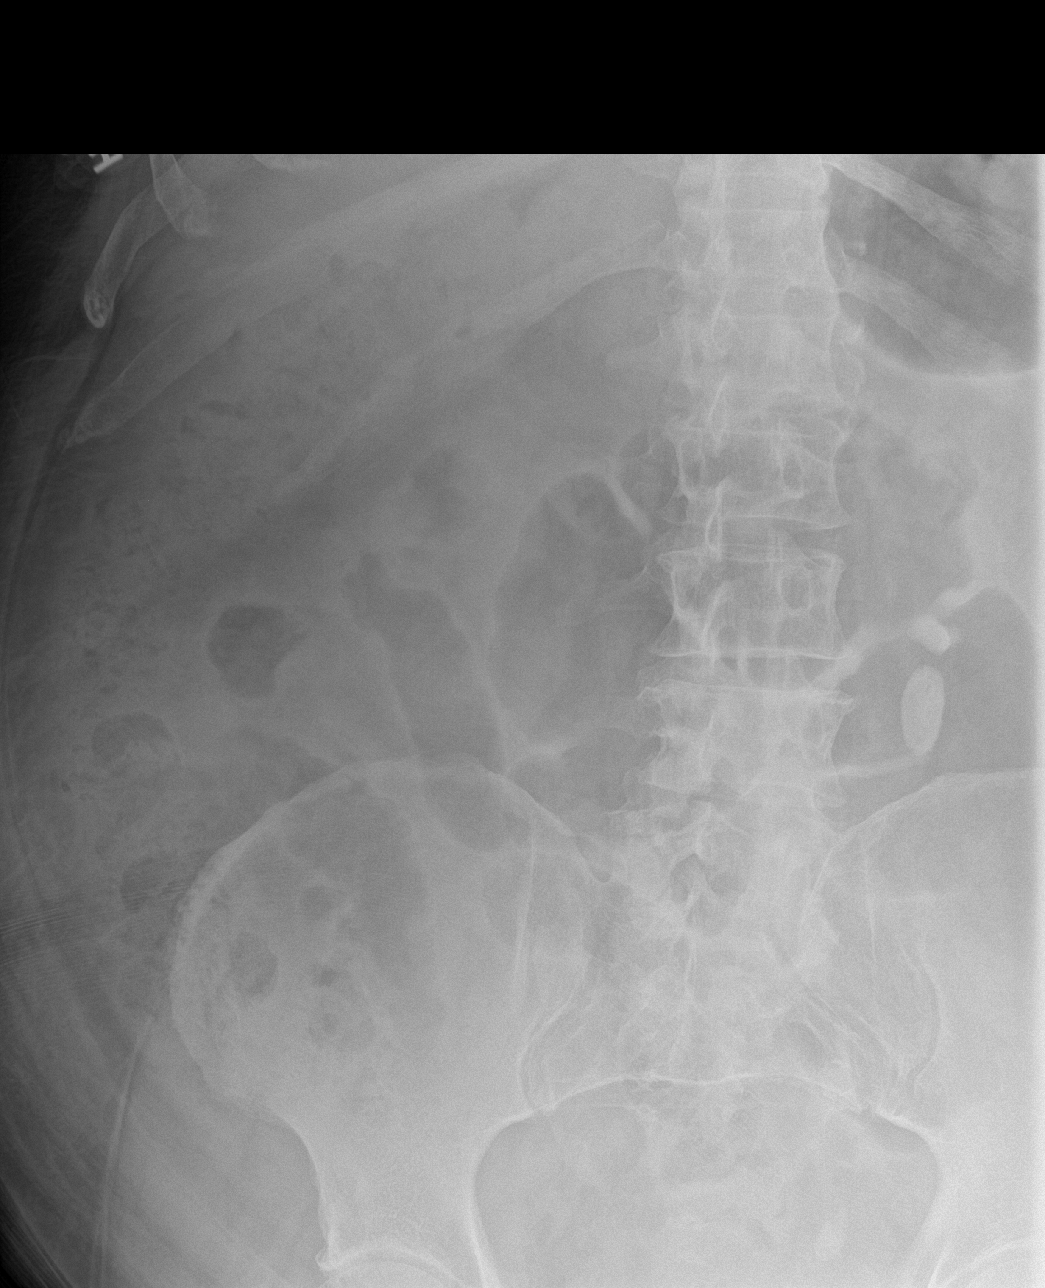
[im 3/4]
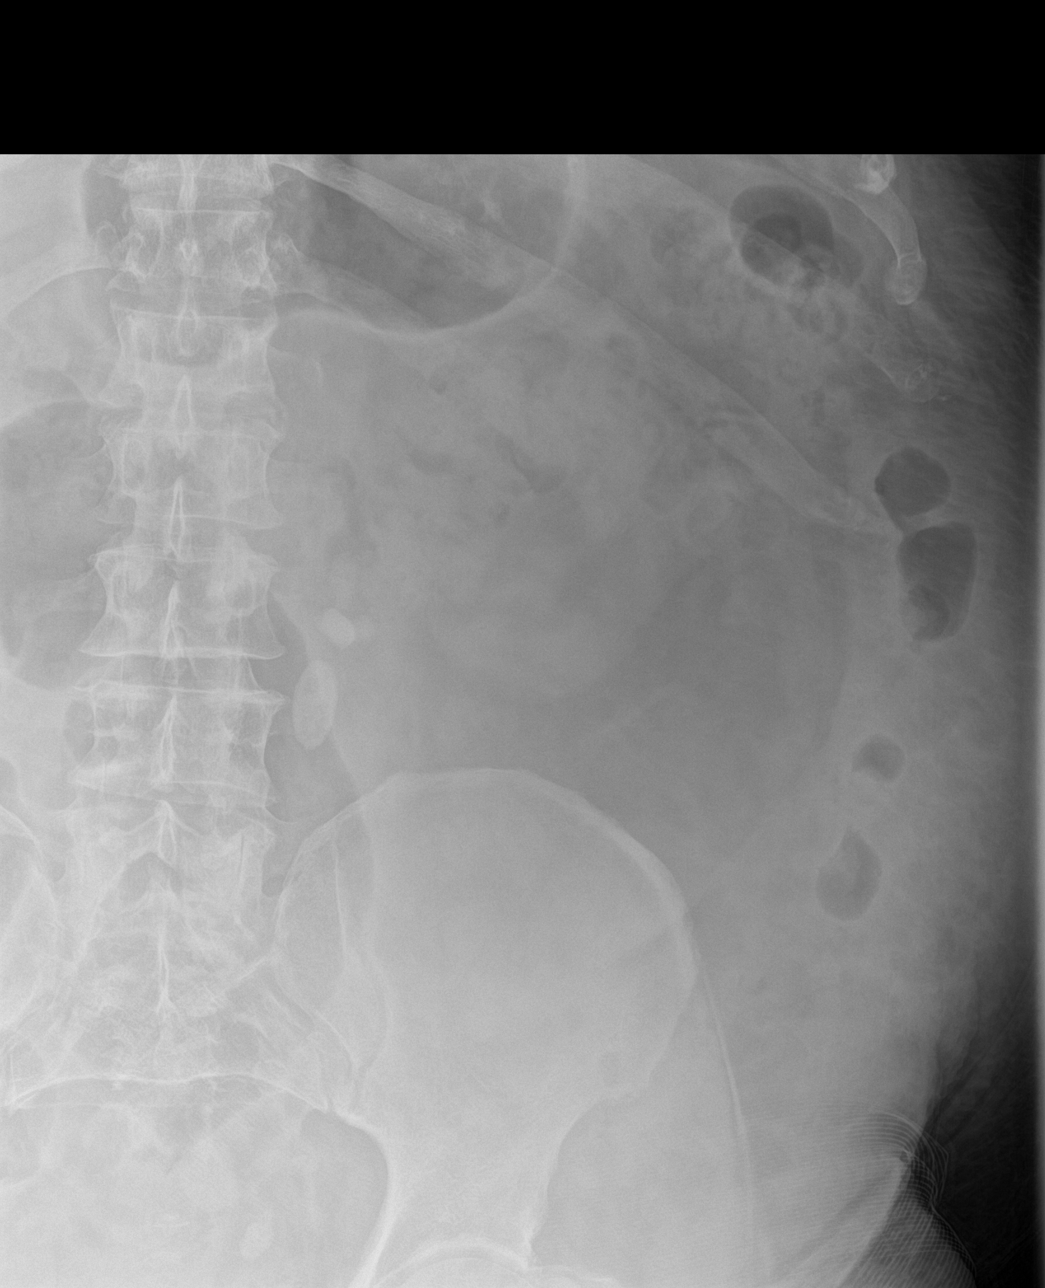
[im 4/4]
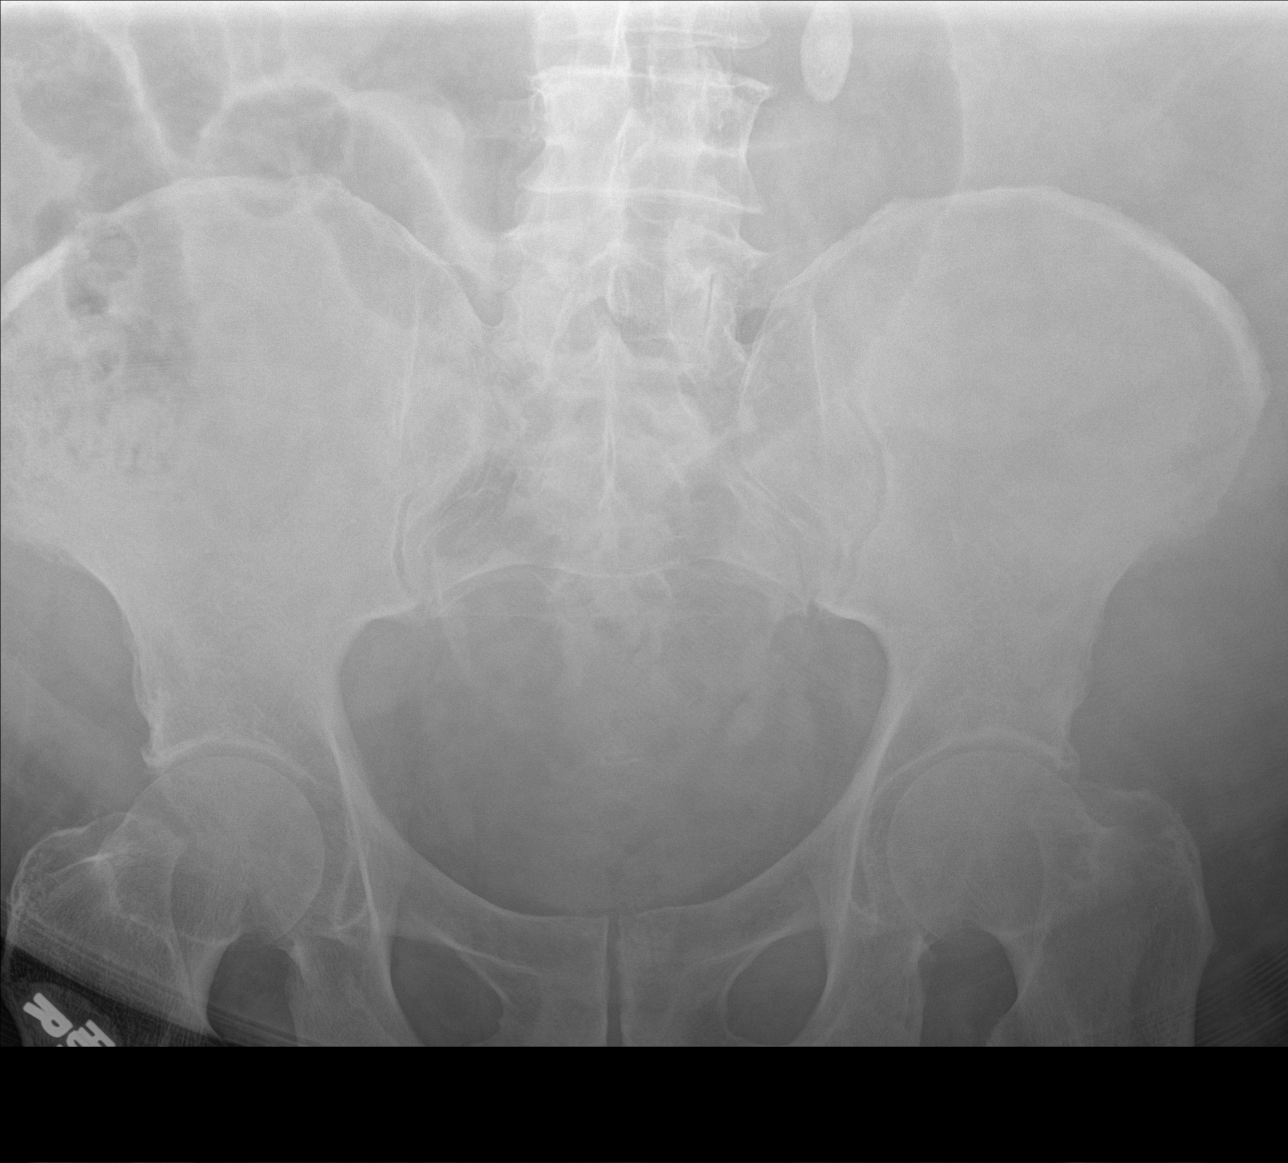

[4 of 4 positions shown; findings below may reference images not displayed]

FINDINGS: Normal bowel gas pattern without free peritoneal air. Adjacent 2.8 x
1.2 cm and 1.4 x 0.9 cm oval calcifications in the expected position
of the proximal to mid left ureter. Mild lumbar spine degenerative
changes.
IMPRESSION: 2.8 cm and 1.4 cm probable left ureteral calculi. Calcified lymph
nodes are less likely.

## 2015-07-24 MED ORDER — SODIUM CHLORIDE 0.9% FLUSH: 3.0000 mL | INTRAVENOUS | Status: DC | PRN

## 2015-07-24 MED ORDER — ASPIRIN 81 MG PO CHEW
81.0000 mg | CHEWABLE_TABLET | ORAL | Status: AC
  Administered 2015-07-25: 81 mg via ORAL
  Filled 2015-07-24: qty 1

## 2015-07-24 MED ORDER — SODIUM CHLORIDE 0.9 % IV SOLN: 250.0000 mL | INTRAVENOUS | Status: DC | PRN

## 2015-07-24 MED ORDER — SODIUM CHLORIDE 0.9 % IV SOLN
INTRAVENOUS | Status: DC
  Administered 2015-07-25: 06:00:00 via INTRAVENOUS

## 2015-07-24 MED ORDER — SODIUM CHLORIDE 0.9% FLUSH
3.0000 mL | Freq: Two times a day (BID) | INTRAVENOUS | Status: DC
  Administered 2015-07-24: 3 mL via INTRAVENOUS

## 2015-07-24 MED ORDER — SODIUM CHLORIDE 0.9 % IV SOLN
INTRAVENOUS | Status: DC
  Administered 2015-07-24: 23:00:00 via INTRAVENOUS

## 2015-07-24 MED ORDER — SODIUM CHLORIDE 0.9% FLUSH: 3.0000 mL | Freq: Two times a day (BID) | INTRAVENOUS | Status: DC

## 2015-07-24 MED ORDER — ASPIRIN 81 MG PO CHEW: 81.0000 mg | CHEWABLE_TABLET | ORAL | Status: AC

## 2015-07-24 NOTE — Progress Notes (Signed)
Patient ID: [name], male   DOB: 11/18/1950, 65 y.o.   MRN: [id number] [facility] PROGRESS NOTE  [name] [id number] DOB: 07/29/1950 DOA: 07/08/2015 PCP: No PCP Per Patient  HPI/Subjective: Patient having some pain in the left back. He has a history of kidney stones. He states his breathing is okay. Offers no complaints. Still swollen in the legs and abdomen.  Objective: Filed Vitals:   07/24/15 0428 07/24/15 1105  BP: 101/59 115/77  Pulse: 93 95  Temp: 97.5 F (36.4 C) 97.5 F (36.4 C)  Resp: 20 20    Filed Weights   07/22/15 0422 07/23/15 0533 07/24/15 0428  Weight: 136.941 kg (301 lb 14.4 oz) 135.24 kg (298 lb 2.4 oz) 136.17 kg (300 lb 3.2 oz)    ROS: Review of Systems  Constitutional: Negative for fever and chills.  Eyes: Negative for blurred vision.  Respiratory: Positive for shortness of breath. Negative for cough.   Cardiovascular: Negative for chest pain.  Gastrointestinal: Negative for nausea, vomiting, abdominal pain, diarrhea and constipation.  Genitourinary: Positive for flank pain. Negative for dysuria.  Musculoskeletal: Negative for joint pain.  Neurological: Negative for dizziness and headaches.   Exam: Physical Exam  Constitutional: [name] oriented to person, place, and time.  HENT:  Nose: No mucosal edema.  Mouth/Throat: No oropharyngeal exudate or posterior oropharyngeal edema.  Eyes: Conjunctivae, EOM and lids are normal. Pupils are equal, round, and reactive to light.  Neck: No JVD present. Carotid bruit is not present. No edema present. No thyroid mass and no thyromegaly present.  Cardiovascular: S1 normal and S2 normal.  Exam reveals no gallop.   No murmur heard. Pulses:      Dorsalis pedis pulses are 2+ on the right side, and 2+ on the left side.  Respiratory: No respiratory distress. He has no wheezes. He has no rhonchi. He has rales in the right lower field and the left lower field.  GI: Soft. Bowel sounds are normal. There  is no tenderness.  Musculoskeletal:       Right ankle: He exhibits swelling.       Left ankle: He exhibits swelling.  Lymphadenopathy:    He has no cervical adenopathy.  Neurological: [name] alert and oriented to person, place, and time. No cranial nerve deficit.  Skin: Skin is warm. No rash noted. Nails show no clubbing.  Psychiatric: He has a normal mood and affect.      Data Reviewed: Basic Metabolic Panel:  Recent Labs Lab 07/19/15 0446 07/20/15 1031 07/22/15 0416 07/23/15 0500 07/24/15 0627  NA 137 136 136 135 135  K 4.0 4.1 4.1 4.2 4.2  CL 96* 95* 97* 97* 97*  CO2 35* 31 32 30 29  GLUCOSE 137* 185* 100* 142* 104*  BUN 35* 40* 49* 52* 54*  CREATININE 1.32* 1.60* 1.48* 1.60* 1.67*  CALCIUM 8.8* 8.8* 8.8* 8.6* 8.7*   CBC:  Recent Labs Lab 07/19/15 0446 07/21/15 0615 07/23/15 0500  WBC 5.8 5.2  --   HGB 11.3* 11.4*  --   HCT 33.8* 34.6*  --   MCV 96.2 95.6  --   PLT 116* 127* 203    CBG:  Recent Labs Lab 07/23/15 1137 07/23/15 1636 07/23/15 2100 07/24/15 0725 07/24/15 1107  GLUCAP 236* 217* 124* 82 173*    Recent Results (from the past 240 hour(s))  MRSA PCR Screening     Status: None   Collection Time: 07/18/15  2:42 PM  Result Value Ref Range Status  MRSA by PCR NEGATIVE NEGATIVE Final    Comment:        The GeneXpert MRSA Assay (FDA approved for NASAL specimens only), is one component of a comprehensive MRSA colonization surveillance program. It is not intended to diagnose MRSA infection nor to guide or monitor treatment for MRSA infections.      Scheduled Meds: . amiodarone  200 mg Oral BID  . atorvastatin  40 mg Oral q1800  . docusate sodium  100 mg Oral BID  . furosemide  60 mg Intravenous BID  . hydrocortisone   Rectal TID  . hydrocortisone  25 mg Rectal Once  . insulin aspart  0-5 Units Subcutaneous QHS  . insulin aspart  0-9 Units Subcutaneous TID WC  . insulin aspart  7 Units Subcutaneous TID WC  . insulin glargine  20  Units Subcutaneous QHS  . loratadine  10 mg Oral QHS  . magnesium citrate  1 Bottle Oral Once  . potassium chloride  20 mEq Oral Daily  . sodium chloride flush  10-40 mL Intracatheter Q12H  . sodium chloride flush  3 mL Intravenous Q12H  . spironolactone  25 mg Oral QHS    Assessment/Plan:  1.  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 Continue IV diuresis with Lasix 60 mg IV twice a day. Continue spironolactone. Blood pressure still on the lower side and can't give beta blocker or ACE inhibitor at this point. 2. Atrial fibrillation continue amiodarone for rate control. Eliquis held for cardiac catheter on Monday. 3. Hyperlipidemia unspecified continue atorvastatin 4. Bilateral lower extremity cellulitis- finished antibiotics 5. Type 2 diabetes without complication continue low-dose Lantus and sliding scale 6. Anxiety on Xanax as needed 7. History of kidney stone on the left- continue oral hydration. X-ray showing two large stones.  Will get renal ultrasound.  Code Status:     Code Status Orders        Start     Ordered   07/15/15 2355  Full code   Continuous     07/15/15 2354    Code Status History    Date Active Date Inactive Code Status Order ID Comments User Context   07/08/2015 10:28 PM 07/15/2015 11:54 PM Full Code [id number]  Demetrios Loll, MD Inpatient     Disposition Plan: To be determined, likely home this coming week  Consultants:  Cardiology  Time spent: 24 minutes  Loletha Grayer  [facility]

## 2015-07-24 NOTE — Progress Notes (Signed)
Subjective: Breathign stable  No CP   Objective: Filed Vitals:   07/23/15 1127 07/23/15 2025 Aug 23, 2015 0428 23-Aug-2015 1105  BP: 110/81 113/77 101/59 115/77  Pulse: 95 97 93 95  Temp: 97.4 F (36.3 C) 98 F (36.7 C) 97.5 F (36.4 C) 97.5 F (36.4 C)  TempSrc: Oral Oral Oral Oral  Resp: 18 20 20 20   Height:      Weight:   136.17 kg (300 lb 3.2 oz)   SpO2: 100% 99% 97% 100%   Weight change: 0.93 kg (2 lb 0.8 oz)  Intake/Output Summary (Last 24 hours) at 23-Aug-2015 1330 Last data filed at Aug 23, 2015 1222  Gross per 24 hour  Intake      0 ml  Output   1050 ml  Net  -1050 ml   Net:  NEg 13.5 L    General: Alert, awake, oriented x3, in no acute distress Neck:  JVP is increaesed   Heart: regular rate and rhythm, without murmurs, rubs, gallops.  Lungs: Clear to auscultation.Rales at bases  . Exemities:  N1+ edema.   Neuro: Grossly intact, nonfocal.  Tele:  SR      Lab Results: Results for orders placed or performed during the hospital encounter of 07/08/15 (from the past 24 hour(s))  Glucose, capillary     Status: Abnormal   Collection Time: 07/23/15  4:36 PM  Result Value Ref Range   Glucose-Capillary 217 (H) 65 - 99 mg/dL  Glucose, capillary     Status: Abnormal   Collection Time: 07/23/15  9:00 PM  Result Value Ref Range   Glucose-Capillary 124 (H) 65 - 99 mg/dL   Comment 1 Notify RN   Basic metabolic panel     Status: Abnormal   Collection Time: 2015-08-23  6:27 AM  Result Value Ref Range   Sodium 135 135 - 145 mmol/L   Potassium 4.2 3.5 - 5.1 mmol/L   Chloride 97 (L) 101 - 111 mmol/L   CO2 29 22 - 32 mmol/L   Glucose, Bld 104 (H) 65 - 99 mg/dL   BUN 54 (H) 6 - 20 mg/dL   Creatinine, Ser 1.67 (H) 0.61 - 1.24 mg/dL   Calcium 8.7 (L) 8.9 - 10.3 mg/dL   GFR calc non Af Amer 42 (L) >60 mL/min   GFR calc Af Amer 49 (L) >60 mL/min   Anion gap 9 5 - 15  Glucose, capillary     Status: None   Collection Time: Aug 23, 2015  7:25 AM  Result Value Ref Range   Glucose-Capillary 82 65 - 99 mg/dL  Glucose, capillary     Status: Abnormal   Collection Time: 08-23-2015 11:07 AM  Result Value Ref Range   Glucose-Capillary 173 (H) 65 - 99 mg/dL    Studies/Results: Dg Abd 2 Views  2015-08-23  CLINICAL DATA:  Left kidney stone.  Difficulty urinating. EXAM: ABDOMEN - 2 VIEW COMPARISON:  None. FINDINGS: Normal bowel gas pattern without free peritoneal air. Adjacent 2.8 x 1.2 cm and 1.4 x 0.9 cm oval calcifications in the expected position of the proximal to mid left ureter. Mild lumbar spine degenerative changes. IMPRESSION: 2.8 cm and 1.4 cm probable left ureteral calculi. Calcified lymph nodes are less likely. Electronically Signed   By: Claudie Revering M.D.   On: 08/23/2015 12:53    Medications: Reviewed  @PROBHOSP @  1  Acute on chronic systolic CHF  LVEF 15 to [id number]  RVE mod reduced.   Volume is still up on exam  Plan for cath tomorrow  Will need to start Hydration tonight  Labs in AM    2.  Atiral fib  CHADSVASc of 4  On amio 200 bid and eliquis is on hold    3.  CAD  Tentative plan for cath    4.  OSA  5  Renal  Will need close f/u   LOS: 16 days   Dorris Carnes 07/24/2015, 1:30 PM

## 2015-07-24 NOTE — Progress Notes (Signed)
Patient is doing good today,persistent bilateral lower extremities edema,iv lasix in progress and diuresing,cbg elevated this evening and covered with sliding scale insulin.

## 2015-07-25 ENCOUNTER — Inpatient Hospital Stay: Payer: Medicaid Other

## 2015-07-25 ENCOUNTER — Encounter: Payer: Self-pay | Admitting: Cardiovascular Disease

## 2015-07-25 ENCOUNTER — Encounter: Payer: Self-pay | Admitting: Registered Nurse

## 2015-07-25 ENCOUNTER — Encounter
Admission: EM | Disposition: A | Discharge status: Home Health | Payer: Self-pay | Source: Home / Self Care | Attending: Internal Medicine

## 2015-07-25 ENCOUNTER — Ambulatory Visit: Payer: Self-pay | Admitting: Family

## 2015-07-25 DIAGNOSIS — I2510 Atherosclerotic heart disease of native coronary artery without angina pectoris: Secondary | ICD-10-CM

## 2015-07-25 DIAGNOSIS — N200 Calculus of kidney: Secondary | ICD-10-CM

## 2015-07-25 HISTORY — PX: CARDIAC CATHETERIZATION: SHX172

## 2015-07-25 LAB — URINALYSIS COMPLETE WITH MICROSCOPIC (ARMC ONLY)
BACTERIA UA: NONE SEEN
Bilirubin Urine: NEGATIVE
GLUCOSE, UA: NEGATIVE mg/dL
KETONES UR: NEGATIVE mg/dL
NITRITE: NEGATIVE
PROTEIN: NEGATIVE mg/dL
SPECIFIC GRAVITY, URINE: 1.02 (ref 1.005–1.030)
pH: 5 (ref 5.0–8.0)

## 2015-07-25 LAB — GLUCOSE, CAPILLARY
GLUCOSE-CAPILLARY: 177 mg/dL — AB (ref 65–99)
GLUCOSE-CAPILLARY: 152 mg/dL — AB (ref 65–99)
GLUCOSE-CAPILLARY: 154 mg/dL — AB (ref 65–99)
Glucose-Capillary: 62 mg/dL — ABNORMAL LOW (ref 65–99)
Glucose-Capillary: 69 mg/dL (ref 65–99)

## 2015-07-25 LAB — PROTIME-INR
INR: 1.84
Prothrombin Time: 21.2 seconds — ABNORMAL HIGH (ref 11.4–15.0)

## 2015-07-25 IMAGING — CT CT RENAL STONE PROTOCOL
1 of 2 series · 14 of 32 positions shown, 18 images · non-contrast
Comparison: Renal ultrasound and abdominal radiographs 1 day prior.

CLINICAL DATA: Left flank pain.  History kidney stones.

EXAM:
CT ABDOMEN AND PELVIS WITHOUT CONTRAST
TECHNIQUE: Multidetector CT imaging of the abdomen and pelvis was performed
following the standard protocol without IV contrast.

[Series 2: stone standard full · axial · 0.91mm/px · z∈[-468,-28]mm · 14 of 102 slices shown, 18 images]
[im 9/102  soft-tissue]
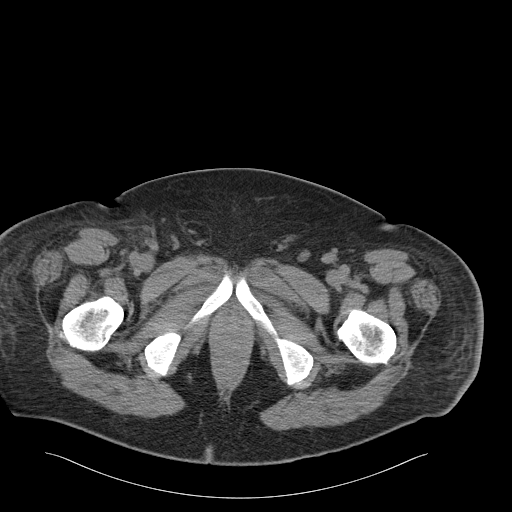
[im 9/102  bone]
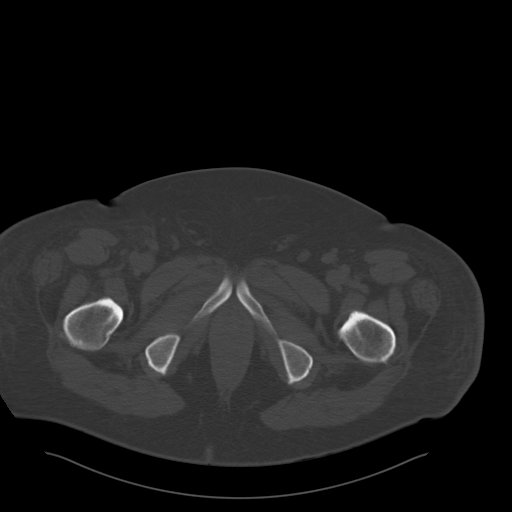
[im 17/102  soft-tissue]
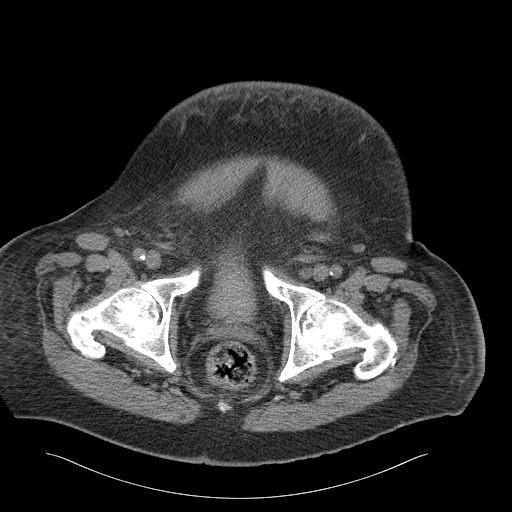
[im 25/102  soft-tissue]
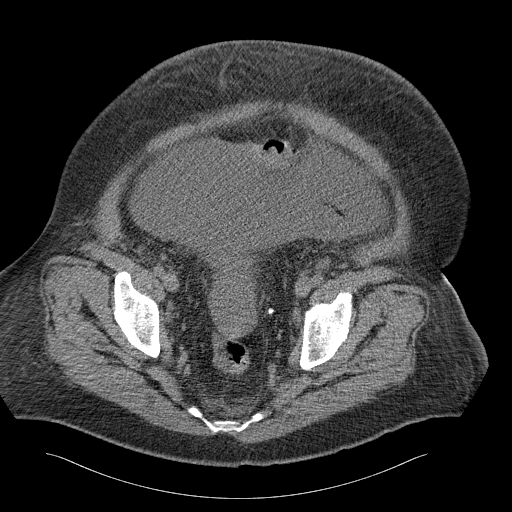
[im 33/102  soft-tissue]
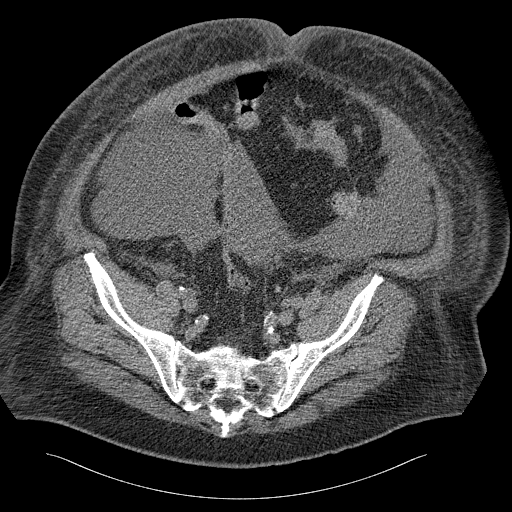
[im 41/102  soft-tissue]
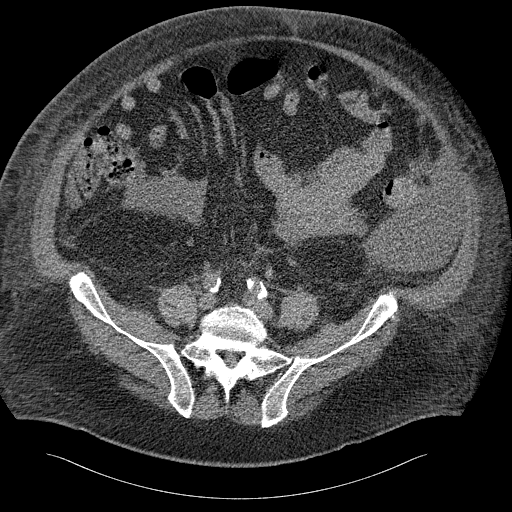
[im 49/102  soft-tissue]
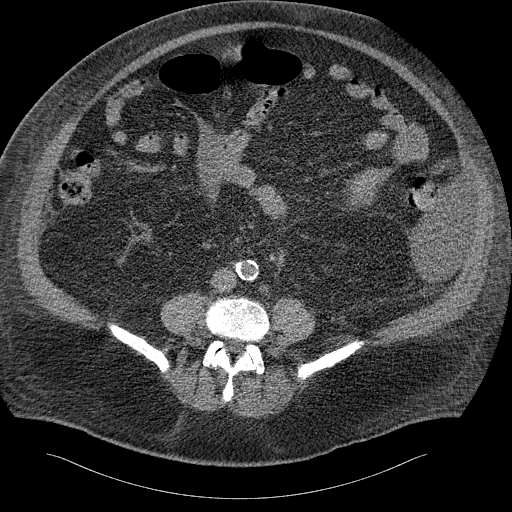
[im 57/102  soft-tissue]
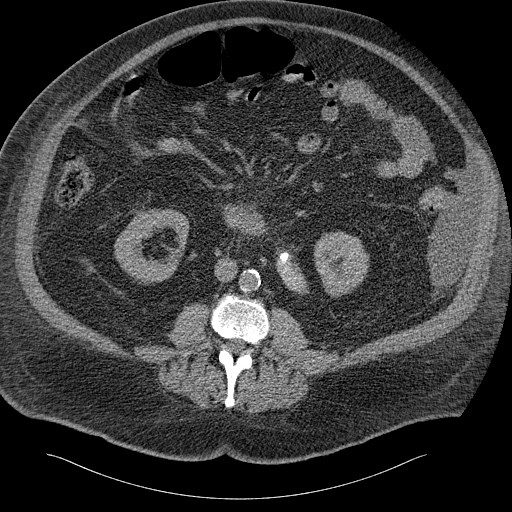
[im 65/102  soft-tissue]
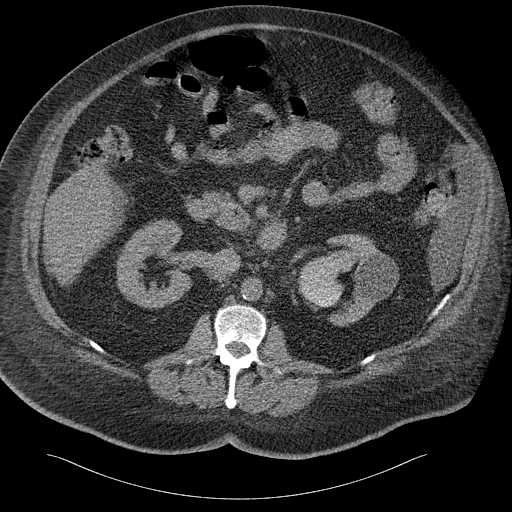
[im 73/102  soft-tissue]
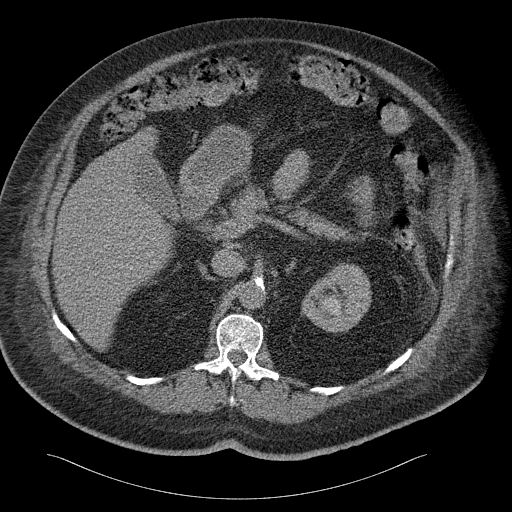
[im 73/102  bone]
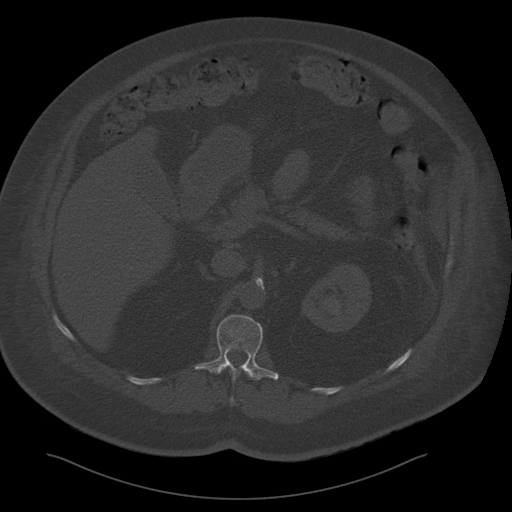
[im 81/102  soft-tissue]
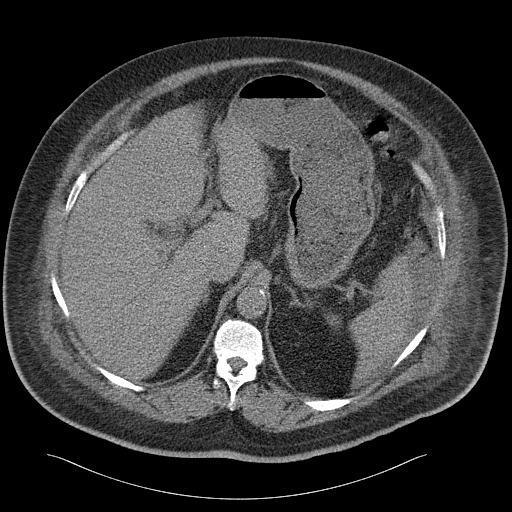
[im 85/102  lung]
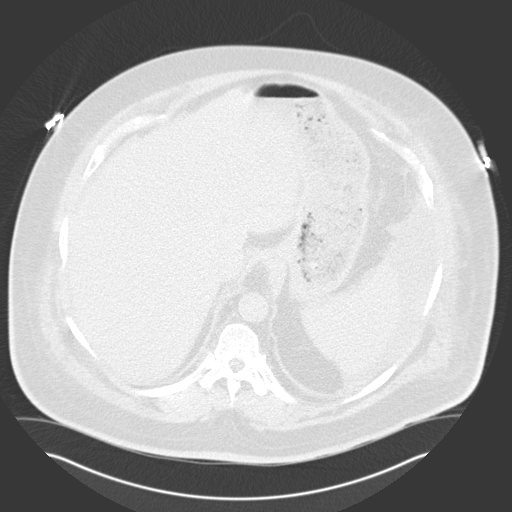
[im 89/102  soft-tissue]
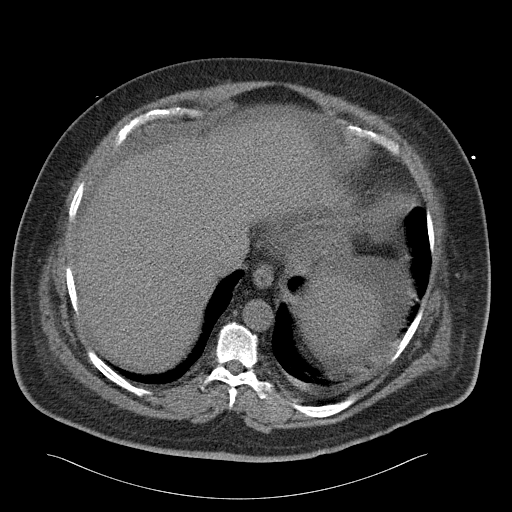
[im 89/102  lung]
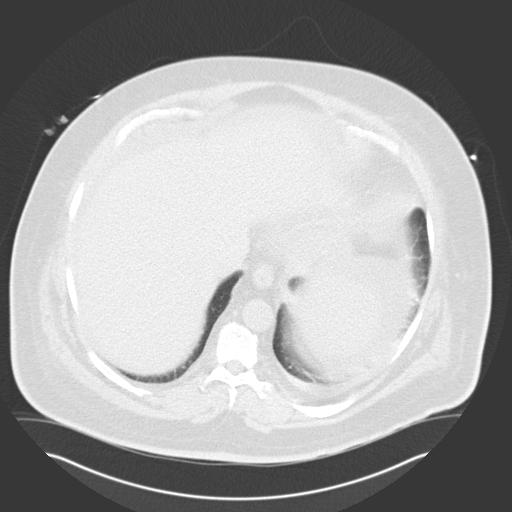
[im 93/102  lung]
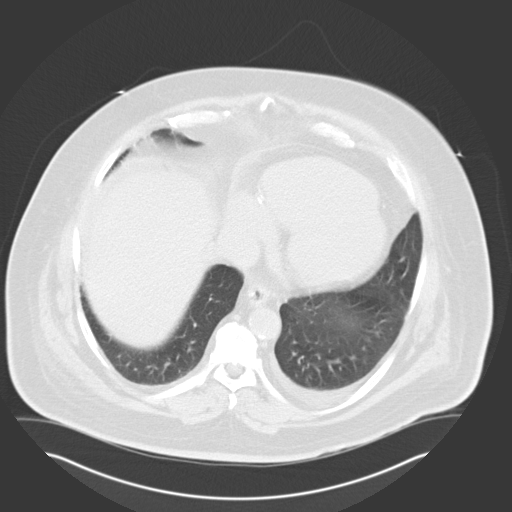
[im 97/102  soft-tissue]
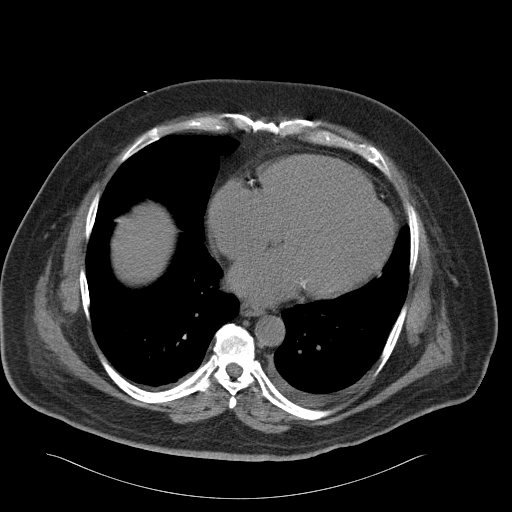
[im 97/102  lung]
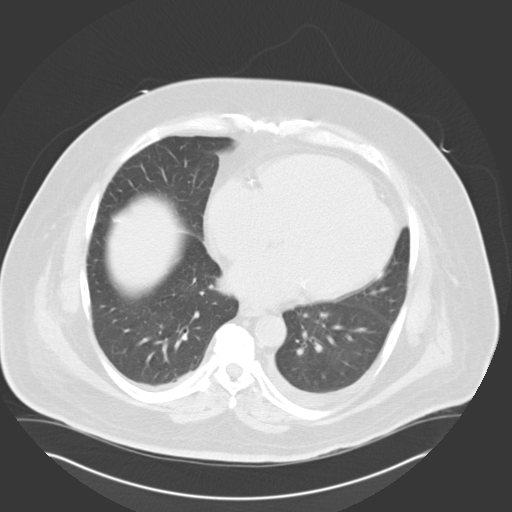

[14 of 32 positions shown; findings below may reference images not displayed]

FINDINGS: Lower chest: Heart mildly enlarged. Coronary artery calcifications
are seen. There is a small left pleural effusion.

Liver: Enlarged with diffusely decreased density consistent with
steatosis.

Hepatobiliary: Gallbladder physiologically distended, no calcified
stone. No biliary dilatation.

Pancreas: No ductal dilatation or inflammation.

Spleen: Normal in size measuring 11.6 cm greatest dimension.

Adrenal glands: No nodule.

Kidneys: There are 2 large stones in the mid proximal left ureter.
More proximal stone measures 1.3 x 1.1 x 2.2 cm, with an adjacent
stone measuring 2.5 x 1.6 x 1.4 cm. Moderate proximal
hydroureteronephrosis with increased density of the urine in the
renal collecting system, suggestive of blood. Ureter distal to this
is decompressed, however is an additional 0.9 x 1.0 x 0.6 cm stone
in the left distal ureter just proximal to the ureterovesicular
junction. There is thinning of the left renal parenchyma. Small
nonobstructing stone in the lower left kidney. Cyst in the
interpolar left kidney measures 3.9 cm. No stones or obstructive
uropathy of the right kidney. Right ureter is decompressed.

Stomach/Bowel: Limited bowel assessment secondary to lack of enteric
contrast, intra-abdominal ascites, and soft tissue attenuation from
body habitus. Stomach physiologically distended with ingested
contents. There are no dilated or thickened small bowel loops. Small
volume of stool throughout the colon without colonic wall
thickening. The appendix is not confidently identified.

Vascular/Lymphatic: No retroperitoneal adenopathy. Abdominal aorta
is normal in caliber moderate aortic atherosclerosis without
aneurysm.

Reproductive: Prostatic calcifications.

Bladder: Decompressed, probable intraluminal blood.

Other: Moderate volume of intra-abdominal and pelvic ascites. Free
fluid in the mesenteric, pelvis, both pericolic gutters in both
upper quadrants. Mild mesenteric and whole body wall edema. No free
air. No loculated intra-abdominal fluid collection.

Musculoskeletal: There are no acute or suspicious osseous
abnormalities. Facet arthropathy in the lower lumbar spine.
IMPRESSION: 1. Left hydroureteronephrosis with 2 large stones in the mid
proximal ureter measuring 2.2 and 2.5 cm. Ureter distal to this is
decompressed, however there is an additional 1 cm stone in the
distal left ureter, just proximal to the ureterovesicular junction.
Increased density of the urine in the bladder and left renal
collecting system consistent with hemorrhage.
2. Moderate volume intra-abdominal ascites. There is hepatic
steatosis.
3. Atherosclerosis, including coronary artery calcifications.
Cardiomegaly and small left effusion in the included lung bases.

## 2015-07-25 SURGERY — RIGHT/LEFT HEART CATH AND CORONARY ANGIOGRAPHY
Anesthesia: Choice

## 2015-07-25 MED ORDER — FENTANYL CITRATE (PF) 100 MCG/2ML IJ SOLN
INTRAMUSCULAR | Status: AC
  Filled 2015-07-25: qty 2

## 2015-07-25 MED ORDER — HEPARIN SODIUM (PORCINE) 1000 UNIT/ML IJ SOLN
INTRAMUSCULAR | Status: AC
  Filled 2015-07-25: qty 1

## 2015-07-25 MED ORDER — FENTANYL CITRATE (PF) 100 MCG/2ML IJ SOLN
INTRAMUSCULAR | Status: DC | PRN
  Administered 2015-07-25: 25 ug via INTRAVENOUS

## 2015-07-25 MED ORDER — MIDAZOLAM HCL 2 MG/2ML IJ SOLN
INTRAMUSCULAR | Status: DC | PRN
  Administered 2015-07-25 (×2): 0.5 mg via INTRAVENOUS

## 2015-07-25 MED ORDER — SODIUM CHLORIDE 0.9% FLUSH: 3.0000 mL | INTRAVENOUS | Status: DC | PRN

## 2015-07-25 MED ORDER — VERAPAMIL HCL 2.5 MG/ML IV SOLN
INTRAVENOUS | Status: DC | PRN
  Administered 2015-07-25: 2.5 mg via INTRA_ARTERIAL

## 2015-07-25 MED ORDER — VERAPAMIL HCL 2.5 MG/ML IV SOLN
INTRAVENOUS | Status: AC
  Filled 2015-07-25: qty 2

## 2015-07-25 MED ORDER — MIDAZOLAM HCL 2 MG/2ML IJ SOLN
INTRAMUSCULAR | Status: AC
  Filled 2015-07-25: qty 2

## 2015-07-25 MED ORDER — HEPARIN SODIUM (PORCINE) 1000 UNIT/ML IJ SOLN
INTRAMUSCULAR | Status: DC | PRN
  Administered 2015-07-25: 6000 [IU] via INTRAVENOUS

## 2015-07-25 MED ORDER — APIXABAN 5 MG PO TABS
5.0000 mg | ORAL_TABLET | Freq: Two times a day (BID) | ORAL | Status: DC
  Administered 2015-07-25 (×2): 5 mg via ORAL
  Filled 2015-07-25 (×3): qty 1

## 2015-07-25 MED ORDER — METOLAZONE 2.5 MG PO TABS
2.5000 mg | ORAL_TABLET | Freq: Every day | ORAL | Status: DC
  Administered 2015-07-25 – 2015-08-01 (×8): 2.5 mg via ORAL
  Filled 2015-07-25 (×8): qty 1

## 2015-07-25 MED ORDER — LIDOCAINE HCL (PF) 1 % IJ SOLN
INTRAMUSCULAR | Status: DC | PRN
  Administered 2015-07-25: 2 mL
  Administered 2015-07-25: 15 mL

## 2015-07-25 MED ORDER — FUROSEMIDE 10 MG/ML IJ SOLN
80.0000 mg | Freq: Two times a day (BID) | INTRAMUSCULAR | Status: DC
  Administered 2015-07-25 – 2015-07-28 (×6): 80 mg via INTRAVENOUS
  Filled 2015-07-25 (×6): qty 8

## 2015-07-25 MED ORDER — HEPARIN (PORCINE) IN NACL 2-0.9 UNIT/ML-% IJ SOLN
INTRAMUSCULAR | Status: AC
  Filled 2015-07-25: qty 1000

## 2015-07-25 MED ORDER — SODIUM CHLORIDE 0.9 % IV SOLN: 250.0000 mL | INTRAVENOUS | Status: DC | PRN

## 2015-07-25 MED ORDER — CARVEDILOL 3.125 MG PO TABS
3.1250 mg | ORAL_TABLET | Freq: Two times a day (BID) | ORAL | Status: DC
  Administered 2015-07-25 – 2015-08-01 (×13): 3.125 mg via ORAL
  Filled 2015-07-25 (×13): qty 1

## 2015-07-25 MED ORDER — SODIUM CHLORIDE 0.9% FLUSH
3.0000 mL | Freq: Two times a day (BID) | INTRAVENOUS | Status: DC
  Administered 2015-07-25 – 2015-07-28 (×5): 3 mL via INTRAVENOUS

## 2015-07-25 MED FILL — Alteplase For Inj 2 MG: INTRAMUSCULAR | Qty: 2 | Status: AC

## 2015-07-25 SURGICAL SUPPLY — 11 items
CATH OPTITORQUE JACKY 4.0 5F (CATHETERS) ×3 IMPLANT
CATH SWANZ 7F THERMO (CATHETERS) ×3 IMPLANT
DEVICE RAD TR BAND REGULAR (VASCULAR PRODUCTS) ×3 IMPLANT
GLIDESHEATH SLEND SS 6F .021 (SHEATH) ×3 IMPLANT
GUIDEWIRE EMER 3M J .025X150CM (WIRE) ×3 IMPLANT
KIT MANI 3VAL PERCEP (MISCELLANEOUS) ×3 IMPLANT
KIT RIGHT HEART (MISCELLANEOUS) ×3 IMPLANT
NEEDLE PERC 18GX7CM (NEEDLE) ×3 IMPLANT
PACK CARDIAC CATH (CUSTOM PROCEDURE TRAY) ×3 IMPLANT
SHEATH PINNACLE 7F 10CM (SHEATH) ×3 IMPLANT
WIRE SAFE-T 1.5MM-J .035X260CM (WIRE) ×3 IMPLANT

## 2015-07-25 NOTE — Progress Notes (Signed)
Patient ID: [name], male   DOB: [birth year], 65 y.o.   MRN: [id number] [facility] PROGRESS NOTE  [name] [id number] DOB: 09-Mar-1951 DOA: 07/08/2015 PCP: No PCP Per Patient  HPI/Subjective: Patient has been medicated with the cardiac catheter has not felt back pain that he has over the last few days. He states he urinates about 250 cc at a time. He did well with the cardiac catheter today.  Objective: Filed Vitals:   07/25/15 1124 07/25/15 1149  BP: 103/80 112/71  Pulse: 83 86  Temp:    Resp: 12     Filed Weights   07/23/15 0533 07/24/15 0428 07/25/15 0500  Weight: 135.24 kg (298 lb 2.4 oz) 136.17 kg (300 lb 3.2 oz) 139.3 kg (307 lb 1.6 oz)    ROS: Review of Systems  Constitutional: Negative for fever and chills.  Eyes: Negative for blurred vision.  Respiratory: Positive for shortness of breath. Negative for cough.   Cardiovascular: Negative for chest pain.  Gastrointestinal: Negative for nausea, vomiting, abdominal pain, diarrhea and constipation.  Genitourinary: Positive for flank pain. Negative for dysuria.  Musculoskeletal: Negative for joint pain.  Neurological: Negative for dizziness and headaches.   Exam: Physical Exam  Constitutional: [name] oriented to person, place, and time.  HENT:  Nose: No mucosal edema.  Mouth/Throat: No oropharyngeal exudate or posterior oropharyngeal edema.  Eyes: Conjunctivae, EOM and lids are normal. Pupils are equal, round, and reactive to light.  Neck: No JVD present. Carotid bruit is not present. No edema present. No thyroid mass and no thyromegaly present.  Cardiovascular: S1 normal and S2 normal.  Exam reveals no gallop.   No murmur heard. Pulses:      Dorsalis pedis pulses are 2+ on the right side, and 2+ on the left side.  Respiratory: No respiratory distress. He has no wheezes. He has no rhonchi. He has rales in the right lower field and the left lower field.  GI: Soft. Bowel sounds are normal. There is no  tenderness.  Musculoskeletal:       Right ankle: He exhibits swelling.       Left ankle: He exhibits swelling.  Lymphadenopathy:    He has no cervical adenopathy.  Neurological: [name] alert and oriented to person, place, and time. No cranial nerve deficit.  Skin: Skin is warm. No rash noted. Nails show no clubbing.  Psychiatric: He has a normal mood and affect.      Data Reviewed: Basic Metabolic Panel:  Recent Labs Lab 07/19/15 0446 07/20/15 1031 07/22/15 0416 07/23/15 0500 07/24/15 0627  NA 137 136 136 135 135  K 4.0 4.1 4.1 4.2 4.2  CL 96* 95* 97* 97* 97*  CO2 35* 31 32 30 29  GLUCOSE 137* 185* 100* 142* 104*  BUN 35* 40* 49* 52* 54*  CREATININE 1.32* 1.60* 1.48* 1.60* 1.67*  CALCIUM 8.8* 8.8* 8.8* 8.6* 8.7*   CBC:  Recent Labs Lab 07/19/15 0446 07/21/15 0615 07/23/15 0500  WBC 5.8 5.2  --   HGB 11.3* 11.4*  --   HCT 33.8* 34.6*  --   MCV 96.2 95.6  --   PLT 116* 127* 203    CBG:  Recent Labs Lab 07/24/15 1107 07/24/15 1632 07/24/15 2129 07/25/15 1146 07/25/15 1228  GLUCAP 173* 232* 163* 62* 69    Recent Results (from the past 240 hour(s))  MRSA PCR Screening     Status: None   Collection Time: 07/18/15  2:42 PM  Result Value Ref  Range Status   MRSA by PCR NEGATIVE NEGATIVE Final    Comment:        The GeneXpert MRSA Assay (FDA approved for NASAL specimens only), is one component of a comprehensive MRSA colonization surveillance program. It is not intended to diagnose MRSA infection nor to guide or monitor treatment for MRSA infections.      Scheduled Meds: . amiodarone  200 mg Oral BID  . apixaban  5 mg Oral BID  . atorvastatin  40 mg Oral q1800  . carvedilol  3.125 mg Oral BID WC  . docusate sodium  100 mg Oral BID  . furosemide  80 mg Intravenous BID  . hydrocortisone   Rectal TID  . hydrocortisone  25 mg Rectal Once  . insulin aspart  0-5 Units Subcutaneous QHS  . insulin aspart  0-9 Units Subcutaneous TID WC  . insulin  aspart  7 Units Subcutaneous TID WC  . insulin glargine  20 Units Subcutaneous QHS  . loratadine  10 mg Oral QHS  . magnesium citrate  1 Bottle Oral Once  . metolazone  2.5 mg Oral Daily  . potassium chloride  20 mEq Oral Daily  . sodium chloride flush  10-40 mL Intracatheter Q12H  . sodium chloride flush  3 mL Intravenous Q12H  . sodium chloride flush  3 mL Intravenous Q12H  . spironolactone  25 mg Oral QHS    Assessment/Plan:  1.  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 Continue IV diuresis with Lasix 80 mg IV twice a day. Continue spironolactone. Low-dose beta blocker and metolazone started. Cardiology still wants to do IV diuresis. Cardiology wrote in their note if the patient still has problems with diuresis may need milrinone. 2. Atrial fibrillation continue amiodarone for rate control. Eliquis restarted by cardiology. 3. Hyperlipidemia unspecified continue atorvastatin 4. Bilateral lower extremity cellulitis- finished antibiotics 5. Type 2 diabetes without complication continue low-dose Lantus and sliding scale 6. Anxiety on Xanax as needed 7. Left flank pain. X-ray showed kidney stones on the left renal pelvis. Ultrasound of the kidney showed some swelling in the left renal pelvis which the patient states that he's had in the past.  Continue oral hydration. Urology consult.  Code Status:     Code Status Orders        Start     Ordered   07/15/15 2355  Full code   Continuous     07/15/15 2354    Code Status History    Date Active Date Inactive Code Status Order ID Comments User Context   07/08/2015 10:28 PM 07/15/2015 11:54 PM Full Code [id number]  Demetrios Loll, MD Inpatient     Disposition Plan: To be determined, likely home this coming week  Consultants:  Cardiology  Time spent: 22 minutes  [place], [place] [facility]

## 2015-07-25 NOTE — Progress Notes (Signed)
3cc of air removed from TR band.  Band is deflated.  No bleeding noted.  Patient is resting with eyes closed.  Awakens when name called.  Denies any pain.  No distress noted.

## 2015-07-25 NOTE — Progress Notes (Signed)
Bleeding noted at TR band site.  Pressure applied.  New TR band placed with 6cc of air.  Patient denies any pain.  right femoral dressing remains clean, dry and intact.

## 2015-07-25 NOTE — Progress Notes (Signed)
TR band removed and pressure dressing applied.  No bleeding noted.  Patient tolerated well.  Denies any pain and no distress noted.

## 2015-07-25 NOTE — Progress Notes (Signed)
3cc of air removed from TR band.  Patient tolerated well.  No c/o.  Site without any bleeding noted.

## 2015-07-25 NOTE — Progress Notes (Signed)
PT Cancellation Note  Patient Details Name: [name] MRN: [id number] DOB: Feb 03, 1951   Cancelled Treatment:    Reason Eval/Treat Not Completed: Patient declined, no reason specified;Other (comment). Initial attempt, pt with MD. Second attempt, pt notes he is not up for PT today due to having a heart catheterization this morning. Pt notes he is "wore out". Re attempt treatment tomorrow.    Erline Levine Bishop 07/25/2015, 2:47 PM

## 2015-07-25 NOTE — Progress Notes (Signed)
TR band removed and 4x4 with tegaderm applied.  No bleeding noted at site.  No c/o from patient.  No distress noted.

## 2015-07-25 NOTE — Consult Note (Signed)
Urology Consult  I have been asked to see the patient by Dr. Leslye Peer, for evaluation and management of left kidney stones.  Chief Complaint: left flank pain  History of Present Illness: [name] is a 65 y.o. year old admitted with multiple medical problems including CHF with an EF of 15-20% admitted on 07/08/15 for acute on chronic CHF.  He reported developing some "restlessness", mild flank pain, urinary urgency (on aggressive IV lasix for diuresis) which he felt may be related to his history of stones. He underwent further workup with a KUB which demonstrated left-sided stones along with a renal ultrasound demonstrating left hydronephrosis.   CT stone protocol shows numerous left ureteral calcifications including a 2.2 cm, 2.5 cm left proximal stone along with a 1 cm left distal stone. The left kidney is atrophic with hydronephrosis.    He does have baseline CKD, creatinine at baseline.  No fevers or chills.  No dysuria.    He does have an extensive personal history of kidney stones. He underwent ESWL 2 and ureteroscopy in the past but has not seen a urologist in over 10 years. He's had intermittent flank pain for numerous years but never sought intervention.  [name] currently anticoagulated (Elaquis) and cardiac catheter earlier today.  Past Medical History  Diagnosis Date  . Chronic combined systolic (congestive) and diastolic (congestive) heart failure (HCC)     a. EF 25% by cath in 2013 b. Echo in 07/2015 showing EF of 15-20%, moderate MR, moderate Pulm HTN, severely dilated IVC  . Sleep apnea   . Diabetes mellitus type 2, uncontrolled ([place])     a. A1c 11.0 in 07/2015.  [name] Renal disorder     kidney stone  . Hypertension   . New onset atrial fibrillation ([place])     a. diagnosed in 07/2015 b. started on Eliquis  . CKD (chronic kidney disease) stage 3, GFR 30-59 ml/min     Past Surgical History  Procedure Laterality Date  . Coronary angioplasty with stent placement    .  Kidney surgery    . Cardiac catheterization N/A 07/25/2015    Procedure: Right/Left Heart Cath and Coronary Angiography;  Surgeon: Wellington Hampshire, MD;  Location: [facility];  Service: Cardiovascular;  Laterality: N/A;    Home Medications:    Medication List    ASK your doctor about these medications        b complex vitamins tablet  Take 1 tablet by mouth daily.     cholecalciferol 1000 units tablet  Commonly known as:  VITAMIN D  Take 2,000 Units by mouth daily.     Cranberry 500 MG Caps  Take 1,000 mg by mouth daily.     furosemide 40 MG tablet  Commonly known as:  LASIX  Take 40 mg by mouth daily.     glipiZIDE 10 MG tablet  Commonly known as:  GLUCOTROL  Take 20-30 mg by mouth 2 (two) times daily. Pt takes three tablets in the morning and two tablets at bedtime.     Hawthorne Berry 550 MG Caps  Take 550 mg by mouth daily.     hydrochlorothiazide 25 MG tablet  Commonly known as:  HYDRODIURIL  Take 25 mg by mouth daily.     metFORMIN 500 MG tablet  Commonly known as:  GLUCOPHAGE  Take 1,000 mg by mouth 2 (two) times daily with a meal.     metoprolol succinate 25 MG 24 hr tablet  Commonly  known as:  TOPROL-XL  Take 25 mg by mouth at bedtime.     multivitamin-lutein Caps capsule  Take 1 capsule by mouth 2 (two) times daily.     Olive Leaf Extract 250 MG Caps  Take 250 mg by mouth 2 (two) times daily.     OVER THE COUNTER MEDICATION  Take 3 capsules by mouth 2 (two) times daily. Pt takes beet root.     OVER THE COUNTER MEDICATION  Take 1 capsule by mouth daily. Pt takes caprylic acid.     spironolactone 25 MG tablet  Commonly known as:  ALDACTONE  Take 25 mg by mouth at bedtime.     TURMERIC CURCUMIN PO  Take 1 capsule by mouth daily.     vitamin C 500 MG tablet  Commonly known as:  ASCORBIC ACID  Take 1,000 mg by mouth daily.     vitamin E 400 UNIT capsule  Take 400 Units by mouth daily.        Allergies: No Known Allergies  Family  History  Problem Relation Age of Onset  . Heart failure Father   . Heart attack Brother     Social History:  reports that he has never smoked. He does not have any smokeless tobacco history on file. He reports that he drinks alcohol. He reports that he does not use illicit drugs.  ROS: A complete review of systems was performed.  All systems are negative except for pertinent findings as noted.  Physical Exam:  Vital signs in last 24 hours: Temp:  [97.6 F (36.4 C)-98 F (36.7 C)] 97.6 F (36.4 C) (02/20 0356) Pulse Rate:  [76-100] 98 (02/20 1828) Resp:  [11-20] 20 (02/20 2018) BP: (96-116)/(62-94) 106/79 mmHg (02/20 2018) SpO2:  [94 %-100 %] 100 % (02/20 1149) Weight:  [307 lb 1.6 oz (139.3 kg)] 307 lb 1.6 oz (139.3 kg) (02/20 0500) Constitutional:  Alert and oriented, No acute distress.   HEENT: [place] AT, moist mucus membranes.  Trachea midline, no masses Cardiovascular: 2+ pitting lower extremity edema.  Respiratory: Normal respiratory effort, no increased work of breathing. GI: Abdomen is soft, nontender, nondistended, no abdominal masses.  Obese. GU: No CVA tenderness. Buried phallus.  Normal scrotum bilateral descended testicles.  Skin: Multiple abdominal and right arm bruises. Neurologic: Grossly intact, no focal deficits, moving all 4 extremities Psychiatric: Normal mood and affect    Laboratory Data:  No results for input(s): WBC, HGB, HCT in the last 72 hours.  Recent Labs  07/23/15 0500 07/24/15 0627  NA 135 135  K 4.2 4.2  CL 97* 97*  CO2 30 29  GLUCOSE 142* 104*  BUN 52* 54*  CREATININE 1.60* 1.67*  CALCIUM 8.6* 8.7*    Recent Labs  07/25/15 0019  INR 1.84   No results for input(s): LABURIN in the last 72 hours. Results for orders placed or performed during the hospital encounter of 07/08/15  MRSA PCR Screening     Status: None   Collection Time: 07/18/15  2:42 PM  Result Value Ref Range Status   MRSA by PCR NEGATIVE NEGATIVE Final    Comment:         The GeneXpert MRSA Assay (FDA approved for NASAL specimens only), is one component of a comprehensive MRSA colonization surveillance program. It is not intended to diagnose MRSA infection nor to guide or monitor treatment for MRSA infections.      Radiologic Imaging: US Renal  07/24/2015  CLINICAL DATA:  Left-sided kidney stone.  Difficulty urinating. EXAM: RENAL / URINARY TRACT ULTRASOUND COMPLETE COMPARISON:  Radiographs today. FINDINGS: Right Kidney: Length: 11.9 cm. Echogenicity within normal limits. No mass or hydronephrosis visualized. Left Kidney: Length: 14.1 cm. The left kidney is suboptimally visualized, partly obscured by bowel gas. There is a probable cyst in the interpolar region measuring approximately 4.1 x 3.6 x 4.1 cm. The left renal pelvis appears mildly distended. The calcifications demonstrated on the radiographs are not well seen, probably within the ureter. Bladder: Appears normal for degree of bladder distention. Ureteral jets were not visualized. Pelvic ascites noted. IMPRESSION: 1. Suboptimal visualization of the left kidney due to bowel gas. The left renal pelvis appears dilated, suggesting obstruction of the left ureter by the calcifications demonstrated on radiographs. 2. Left renal cyst. 3. Pelvic ascites. 4. CT suggested for further evaluation. Electronically Signed   By: Richardean Sale M.D.   On: 07/24/2015 19:32   Dg Abd 2 Views  07/24/2015  CLINICAL DATA:  Left kidney stone.  Difficulty urinating. EXAM: ABDOMEN - 2 VIEW COMPARISON:  None. FINDINGS: Normal bowel gas pattern without free peritoneal air. Adjacent 2.8 x 1.2 cm and 1.4 x 0.9 cm oval calcifications in the expected position of the proximal to mid left ureter. Mild lumbar spine degenerative changes. IMPRESSION: 2.8 cm and 1.4 cm probable left ureteral calculi. Calcified lymph nodes are less likely. Electronically Signed   By: Claudie Revering M.D.   On: 07/24/2015 12:53   Ct Renal Stone  Study  07/25/2015  CLINICAL DATA:  Left flank pain.  History kidney stones. EXAM: CT ABDOMEN AND PELVIS WITHOUT CONTRAST TECHNIQUE: Multidetector CT imaging of the abdomen and pelvis was performed following the standard protocol without IV contrast. COMPARISON:  Renal ultrasound and abdominal radiographs 1 day prior. FINDINGS: Lower chest: Heart mildly enlarged. Coronary artery calcifications are seen. There is a small left pleural effusion. Liver: Enlarged with diffusely decreased density consistent with steatosis. Hepatobiliary: Gallbladder physiologically distended, no calcified stone. No biliary dilatation. Pancreas: No ductal dilatation or inflammation. Spleen: Normal in size measuring 11.6 cm greatest dimension. Adrenal glands: No nodule. Kidneys: There are 2 large stones in the mid proximal left ureter. More proximal stone measures 1.3 x 1.1 x 2.2 cm, with an adjacent stone measuring 2.5 x 1.6 x 1.4 cm. Moderate proximal hydroureteronephrosis with increased density of the urine in the renal collecting system, suggestive of blood. Ureter distal to this is decompressed, however is an additional 0.9 x 1.0 x 0.6 cm stone in the left distal ureter just proximal to the ureterovesicular junction. There is thinning of the left renal parenchyma. Small nonobstructing stone in the lower left kidney. Cyst in the interpolar left kidney measures 3.9 cm. No stones or obstructive uropathy of the right kidney. Right ureter is decompressed. Stomach/Bowel: Limited bowel assessment secondary to lack of enteric contrast, intra-abdominal ascites, and soft tissue attenuation from body habitus. Stomach physiologically distended with ingested contents. There are no dilated or thickened small bowel loops. Small volume of stool throughout the colon without colonic wall thickening. The appendix is not confidently identified. Vascular/Lymphatic: No retroperitoneal adenopathy. Abdominal aorta is normal in caliber moderate aortic  atherosclerosis without aneurysm. Reproductive: Prostatic calcifications. Bladder: Decompressed, probable intraluminal blood. Other: Moderate volume of intra-abdominal and pelvic ascites. Free fluid in the mesenteric, pelvis, both pericolic gutters in both upper quadrants. Mild mesenteric and whole body wall edema. No free air. No loculated intra-abdominal fluid collection. Musculoskeletal: There are no acute or suspicious osseous abnormalities. Facet arthropathy in the lower  lumbar spine. IMPRESSION: 1. Left hydroureteronephrosis with 2 large stones in the mid proximal ureter measuring 2.2 and 2.5 cm. Ureter distal to this is decompressed, however there is an additional 1 cm stone in the distal left ureter, just proximal to the ureterovesicular junction. Increased density of the urine in the bladder and left renal collecting system consistent with hemorrhage. 2. Moderate volume intra-abdominal ascites. There is hepatic steatosis. 3. Atherosclerosis, including coronary artery calcifications. Cardiomegaly and small left effusion in the included lung bases. Electronically Signed   By: Jeb Levering M.D.   On: 07/25/2015 19:45   CT scan personally reviewed. Increased density in the left collecting system and bladder is likely delayed contrast excretion from earlier cardiac catheter today. Do not suspect hemorrhage.  Impression/Assessment:   1. Obstructing left ureteral stones/ left flank pain- CT scan reviewed. Results discussed with patient as well. 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  2. 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 CKD III, Cr near baseline ~1.4  4.  Multiple medical comorbidities including CHF, anticoagulated state    Plan:  -No acute/ urgent intervention warranted, stones appear to be very chronic in nature, pain minimal currently -Will discuss case with primary care Dr. Leslye Peer as well as cardiology  tomorrow.  Given the overall stone burden, treating the stones will not be trivial. Additionally, given the degree of ureteral obstruction, ureteral stenting would likely fail on the first attempt. As such, he will likely need a left percutaneous nephrostomy and ultimately a left nephroureteral tube followed by retrograde ureteroscopy versus ESWL for treatment of these ureteral stones. This will be in the form of multiple staged procedures to clear him of his stone burden and will require holding anticoagulation at least for a portion of the procedures under anesthesia.  Other options include left nephrectomy given the overall appearance of his atrophic left kidney which is likely not contributing much to his overall renal function, however, given his overall health status, I do not feel that he's an ideal candidate for a major surgery currently.   -He will need to be medically optimized and cleared to hold anticoagulation for any elective procedure procedure moving forward -Should the patient become febrile or otherwise hemodynamically unstable concerning for sepsis, he will need emergent left percutaneous nephrostomy tube -The above was disclosed to the patient, will develop a plan for treatment after talking to his primary team -UA/ CBC to r/o infection   07/25/2015, 8:28 PM  Hollice Espy,  MD   Thank you for involving me in this patient's care, I will continue to follow along.Please page with any further questions or concerns.  I spent 55 min with this patient of which greater than 50% was spent in counseling and coordination of care with the patient.

## 2015-07-25 NOTE — Progress Notes (Signed)
3cc of air removed from TR band.  No bleeding noted from site.  No complaints from patient.

## 2015-07-25 NOTE — Progress Notes (Signed)
Dressing to TR band clean, dry and intact.  Right femoral dressing is clean, dry and intact.  No bruising or swelling noted at site.  No c/o from patient.  Denies any pain or discomfort.

## 2015-07-25 NOTE — Progress Notes (Signed)
Hypoglycemic Event  CBG: 62  Treatment: 2 cups of orange juice, held 1200 insulin orders.   Symptoms: patient was asymptomatic  Follow-up CBG: Time: 12:28 CBG Result: 69  Possible Reasons for Event: Patient received 20 units Lantus at bedtime. Patient was NPO for cardiac cath.   Comments/MD notified: Dr. Leslye Peer made aware.   Rechecked CBG after patient had another glass of orange and clear liquid diet. CBG was 177. Will continue to monitor patient    Horton Finer

## 2015-07-25 NOTE — Progress Notes (Signed)
3cc of air removed from TR band. No bleeding noted.

## 2015-07-25 NOTE — H&P (View-Only) (Signed)
Subjective: Breathign stable  No CP   Objective: Filed Vitals:   07/23/15 1127 07/23/15 2025 07/28/2015 0428 2015/07/28 1105  BP: 110/81 113/77 101/59 115/77  Pulse: 95 97 93 95  Temp: 97.4 F (36.3 C) 98 F (36.7 C) 97.5 F (36.4 C) 97.5 F (36.4 C)  TempSrc: Oral Oral Oral Oral  Resp: 18 20 20 20   Height:      Weight:   136.17 kg (300 lb 3.2 oz)   SpO2: 100% 99% 97% 100%   Weight change: 0.93 kg (2 lb 0.8 oz)  Intake/Output Summary (Last 24 hours) at 2015/07/28 1330 Last data filed at 07/28/2015 1222  Gross per 24 hour  Intake      0 ml  Output   1050 ml  Net  -1050 ml   Net:  NEg 13.5 L    General: Alert, awake, oriented x3, in no acute distress Neck:  JVP is increaesed   Heart: regular rate and rhythm, without murmurs, rubs, gallops.  Lungs: Clear to auscultation.Rales at bases  . Exemities:  N1+ edema.   Neuro: Grossly intact, nonfocal.  Tele:  SR      Lab Results: Results for orders placed or performed during the hospital encounter of 07/08/15 (from the past 24 hour(s))  Glucose, capillary     Status: Abnormal   Collection Time: 07/23/15  4:36 PM  Result Value Ref Range   Glucose-Capillary 217 (H) 65 - 99 mg/dL  Glucose, capillary     Status: Abnormal   Collection Time: 07/23/15  9:00 PM  Result Value Ref Range   Glucose-Capillary 124 (H) 65 - 99 mg/dL   Comment 1 Notify RN   Basic metabolic panel     Status: Abnormal   Collection Time: 07/28/2015  6:27 AM  Result Value Ref Range   Sodium 135 135 - 145 mmol/L   Potassium 4.2 3.5 - 5.1 mmol/L   Chloride 97 (L) 101 - 111 mmol/L   CO2 29 22 - 32 mmol/L   Glucose, Bld 104 (H) 65 - 99 mg/dL   BUN 54 (H) 6 - 20 mg/dL   Creatinine, Ser 1.67 (H) 0.61 - 1.24 mg/dL   Calcium 8.7 (L) 8.9 - 10.3 mg/dL   GFR calc non Af Amer 42 (L) >60 mL/min   GFR calc Af Amer 49 (L) >60 mL/min   Anion gap 9 5 - 15  Glucose, capillary     Status: None   Collection Time: 07/28/2015  7:25 AM  Result Value Ref Range   Glucose-Capillary 82 65 - 99 mg/dL  Glucose, capillary     Status: Abnormal   Collection Time: 07-28-15 11:07 AM  Result Value Ref Range   Glucose-Capillary 173 (H) 65 - 99 mg/dL    Studies/Results: Dg Abd 2 Views  2015/07/28  CLINICAL DATA:  Left kidney stone.  Difficulty urinating. EXAM: ABDOMEN - 2 VIEW COMPARISON:  None. FINDINGS: Normal bowel gas pattern without free peritoneal air. Adjacent 2.8 x 1.2 cm and 1.4 x 0.9 cm oval calcifications in the expected position of the proximal to mid left ureter. Mild lumbar spine degenerative changes. IMPRESSION: 2.8 cm and 1.4 cm probable left ureteral calculi. Calcified lymph nodes are less likely. Electronically Signed   By: Claudie Revering M.D.   On: July 28, 2015 12:53    Medications: Reviewed  @PROBHOSP @  1  Acute on chronic systolic CHF  LVEF 15 to 123456  RVE mod reduced.   Volume is still up on exam  Plan for cath tomorrow  Will need to start Hydration tonight  Labs in AM    2.  Atiral fib  CHADSVASc of 4  On amio 200 bid and eliquis is on hold    3.  CAD  Tentative plan for cath    4.  OSA  5  Renal  Will need close f/u   LOS: 16 days   Dorris Carnes 07/24/2015, 1:30 PM

## 2015-07-25 NOTE — Progress Notes (Signed)
3cc of air removed from TR band.  No bleeding noted and no c/o from patient.

## 2015-07-25 NOTE — Interval H&P Note (Signed)
History and Physical Interval Note:  07/25/2015 7:45 AM  [name]  has presented today for surgery, with the diagnosis of Acute Systolic Congestive Heart Failure; Reduced Ejection Fraction Room 241  The various methods of treatment have been discussed with the patient and family. After consideration of risks, benefits and other options for treatment, the patient has consented to  Procedure(s): Right/Left Heart Cath and Coronary Angiography (N/A) as a surgical intervention .  The patient's history has been reviewed, patient examined, no change in status, stable for surgery.  I have reviewed the patient's chart and labs.  Questions were answered to the patient's satisfaction.     Kathlyn Sacramento

## 2015-07-25 NOTE — Care Management (Addendum)
Attempted to speak with patient. [name] resting with eyes closed. No answer to commands.  Cardiac Cath today. EF 15-20%. Remains on IV diuretics BID. May need renal dose milrinone if diuresis shows no improvement.  Left kidney stones. Urology consult.

## 2015-07-25 NOTE — Progress Notes (Signed)
3cc of air removed from TR band.  No c/o from patient.  No bleeding noted from site.

## 2015-07-26 LAB — CBC
HEMATOCRIT: 36.5 % — AB (ref 40.0–52.0)
HEMOGLOBIN: 12.1 g/dL — AB (ref 13.0–18.0)
MCH: 32.1 pg (ref 26.0–34.0)
MCHC: 33.2 g/dL (ref 32.0–36.0)
MCV: 96.6 fL (ref 80.0–100.0)
Platelets: 230 10*3/uL (ref 150–440)
RBC: 3.77 MIL/uL — ABNORMAL LOW (ref 4.40–5.90)
RDW: 15.1 % — AB (ref 11.5–14.5)
WBC: 6 10*3/uL (ref 3.8–10.6)

## 2015-07-26 LAB — GLUCOSE, CAPILLARY
GLUCOSE-CAPILLARY: 56 mg/dL — AB (ref 65–99)
GLUCOSE-CAPILLARY: 65 mg/dL (ref 65–99)
Glucose-Capillary: 156 mg/dL — ABNORMAL HIGH (ref 65–99)
Glucose-Capillary: 137 mg/dL — ABNORMAL HIGH (ref 65–99)
Glucose-Capillary: 108 mg/dL — ABNORMAL HIGH (ref 65–99)

## 2015-07-26 MED ORDER — INSULIN GLARGINE 100 UNIT/ML ~~LOC~~ SOLN
16.0000 [IU] | Freq: Every day | SUBCUTANEOUS | Status: DC
  Administered 2015-07-26: 16 [IU] via SUBCUTANEOUS
  Filled 2015-07-26 (×2): qty 0.16

## 2015-07-26 MED ORDER — ENOXAPARIN SODIUM 40 MG/0.4ML ~~LOC~~ SOLN
40.0000 mg | SUBCUTANEOUS | Status: DC
  Administered 2015-07-26: 40 mg via SUBCUTANEOUS
  Filled 2015-07-26: qty 0.4

## 2015-07-26 MED ORDER — CALCIUM POLYCARBOPHIL 625 MG PO TABS
625.0000 mg | ORAL_TABLET | Freq: Every day | ORAL | Status: DC
  Administered 2015-07-26 – 2015-08-01 (×7): 625 mg via ORAL
  Filled 2015-07-26 (×9): qty 1

## 2015-07-26 NOTE — Progress Notes (Signed)
Patient ID: [name], male   DOB: 1950/12/01, 65 y.o.   MRN: [id number] [facility] PROGRESS NOTE  [name] [id number] DOB: [birth year] DOA: 07/08/2015 PCP: No PCP Per Patient  HPI/Subjective: Patient feeling okay today. Does have a little bit of flank pain. He was impressed by Dr. Erlene Quan urology coming to see him last night.  Objective: Filed Vitals:   07/26/15 0757 07/26/15 1130  BP: 91/61 116/85  Pulse: 77 87  Temp:  97.7 F (36.5 C)  Resp:  17    Filed Weights   07/23/15 0533 07/24/15 0428 07/25/15 0500  Weight: 135.24 kg (298 lb 2.4 oz) 136.17 kg (300 lb 3.2 oz) 139.3 kg (307 lb 1.6 oz)    ROS: Review of Systems  Constitutional: Negative for fever and chills.  Eyes: Negative for blurred vision.  Respiratory: Positive for shortness of breath. Negative for cough.   Cardiovascular: Negative for chest pain.  Gastrointestinal: Negative for nausea, vomiting, abdominal pain, diarrhea and constipation.  Genitourinary: Positive for flank pain. Negative for dysuria.  Musculoskeletal: Negative for joint pain.  Neurological: Negative for dizziness and headaches.   Exam: Physical Exam  Constitutional: [name] oriented to person, place, and time.  HENT:  Nose: No mucosal edema.  Mouth/Throat: No oropharyngeal exudate or posterior oropharyngeal edema.  Eyes: Conjunctivae, EOM and lids are normal. Pupils are equal, round, and reactive to light.  Neck: No JVD present. Carotid bruit is not present. No edema present. No thyroid mass and no thyromegaly present.  Cardiovascular: S1 normal and S2 normal.  Exam reveals no gallop.   No murmur heard. Pulses:      Dorsalis pedis pulses are 2+ on the right side, and 2+ on the left side.  Respiratory: No respiratory distress. He has no wheezes. He has no rhonchi. He has rales in the right lower field and the left lower field.  GI: Soft. Bowel sounds are normal. There is no tenderness.  Musculoskeletal:       Right ankle:  He exhibits swelling.       Left ankle: He exhibits swelling.  Lymphadenopathy:    He has no cervical adenopathy.  Neurological: [name] alert and oriented to person, place, and time. No cranial nerve deficit.  Skin: Skin is warm. No rash noted. Nails show no clubbing.  Psychiatric: He has a normal mood and affect.      Data Reviewed: Basic Metabolic Panel:  Recent Labs Lab 07/20/15 1031 07/22/15 0416 07/23/15 0500 07/24/15 0627  NA 136 136 135 135  K 4.1 4.1 4.2 4.2  CL 95* 97* 97* 97*  CO2 31 32 30 29  GLUCOSE 185* 100* 142* 104*  BUN 40* 49* 52* 54*  CREATININE 1.60* 1.48* 1.60* 1.67*  CALCIUM 8.8* 8.8* 8.6* 8.7*   CBC:  Recent Labs Lab 07/21/15 0615 07/23/15 0500 07/26/15 0445  WBC 5.2  --  6.0  HGB 11.4*  --  12.1*  HCT 34.6*  --  36.5*  MCV 95.6  --  96.6  PLT 127* 203 230    CBG:  Recent Labs Lab 07/25/15 1559 07/25/15 2126 07/26/15 0718 07/26/15 0817 07/26/15 1101  GLUCAP 152* 154* 56* 65 156*    Recent Results (from the past 240 hour(s))  MRSA PCR Screening     Status: None   Collection Time: 07/18/15  2:42 PM  Result Value Ref Range Status   MRSA by PCR NEGATIVE NEGATIVE Final    Comment:  The GeneXpert MRSA Assay (FDA approved for NASAL specimens only), is one component of a comprehensive MRSA colonization surveillance program. It is not intended to diagnose MRSA infection nor to guide or monitor treatment for MRSA infections.      Scheduled Meds: . amiodarone  200 mg Oral BID  . atorvastatin  40 mg Oral q1800  . carvedilol  3.125 mg Oral BID WC  . docusate sodium  100 mg Oral BID  . enoxaparin (LOVENOX) injection  40 mg Subcutaneous Q24H  . furosemide  80 mg Intravenous BID  . hydrocortisone   Rectal TID  . hydrocortisone  25 mg Rectal Once  . insulin aspart  0-5 Units Subcutaneous QHS  . insulin aspart  0-9 Units Subcutaneous TID WC  . insulin aspart  7 Units Subcutaneous TID WC  . insulin glargine  20 Units  Subcutaneous QHS  . loratadine  10 mg Oral QHS  . magnesium citrate  1 Bottle Oral Once  . metolazone  2.5 mg Oral Daily  . polycarbophil  625 mg Oral Daily  . potassium chloride  20 mEq Oral Daily  . sodium chloride flush  10-40 mL Intracatheter Q12H  . sodium chloride flush  3 mL Intravenous Q12H  . sodium chloride flush  3 mL Intravenous Q12H  . spironolactone  25 mg Oral QHS    Assessment/Plan:  1.  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 Continue IV diuresis with Lasix 80 mg IV twice a day. Continue spironolactone. Low-dose beta blocker if blood pressure will tolerate and metolazone started. Cardiology still wants to do IV diuresis. Cardiology wrote in their note if the patient still has problems with diuresis may need milrinone. 2. Atrial fibrillation continue amiodarone for rate control. Eliquis now on hold for urology procedure. 3. Hyperlipidemia unspecified continue atorvastatin 4. Bilateral lower extremity cellulitis- finished antibiotics 5. Type 2 diabetes without complication continue low-dose Lantus and sliding scale 6. Anxiety on Xanax as needed 7. Chronic left nephrolithiasis with chronic hydronephrosis. Hold eliquis. Urology planning on doing a nephrostomy tube in a couple days and then following up stones management as outpatient.  Code Status:     Code Status Orders        Start     Ordered   07/15/15 2355  Full code   Continuous     07/15/15 2354    Code Status History    Date Active Date Inactive Code Status Order ID Comments User Context   07/08/2015 10:28 PM 07/15/2015 11:54 PM Full Code [id number]  Demetrios Loll, MD Inpatient     Disposition Plan: To be determined  Consultants:  Cardiology  Urology  Time spent: 20 minutes  Loletha Grayer  [facility]

## 2015-07-26 NOTE — Care Management (Signed)
Faxed Medication Management application to [id number]. Returned original copay to patient.

## 2015-07-26 NOTE — Progress Notes (Signed)
Hospital Problem List     Principal Problem:   Acute on chronic combined systolic and diastolic CHF (congestive heart failure) (HCC) Active Problems:   Elevated troponin   Pulmonary hypertension (HCC)   Cardiorenal syndrome   Acute on chronic renal failure (HCC)   Morbid obesity due to excess calories (HCC)   Paroxysmal atrial fibrillation (HCC)   Orthostatic hypotension   Hyponatremia   Uncontrolled type 2 diabetes mellitus (HCC)   OSA (obstructive sleep apnea)   Kidney stone on left side    Patient Profile:   65 y.o. male w/ PMH of CAD (s/p remote PCI and stenting 25 years ago), ischemic cardiomyopathy (EF 15-20% this admission), obesity, OSA, HTN, and DM admitted to [facility] on 07/08/2015 with acute on chronic combined systolic and diastolic CHF.  Subjective   Reports no remaining complications from his cath yesterday (Did move his wrist when still on precautions yesterday and had minimal bleeding at the site. No bleeding since). CT performed yesterday showed left hydroureteronephrosis with 2 large stones in the mid proximal ureter measuring 2.2 and 2.5 cm. Urology says the stones appear to be chronic and do not see any need for urgent intervention. They are considering a potential stent vs. nephroureteral tube according to the patient.  Inpatient Medications    . amiodarone  200 mg Oral BID  . atorvastatin  40 mg Oral q1800  . carvedilol  3.125 mg Oral BID WC  . docusate sodium  100 mg Oral BID  . furosemide  80 mg Intravenous BID  . hydrocortisone   Rectal TID  . hydrocortisone  25 mg Rectal Once  . insulin aspart  0-5 Units Subcutaneous QHS  . insulin aspart  0-9 Units Subcutaneous TID WC  . insulin aspart  7 Units Subcutaneous TID WC  . insulin glargine  20 Units Subcutaneous QHS  . loratadine  10 mg Oral QHS  . magnesium citrate  1 Bottle Oral Once  . metolazone  2.5 mg Oral Daily  . potassium chloride  20 mEq Oral Daily  . sodium chloride flush  10-40 mL  Intracatheter Q12H  . sodium chloride flush  3 mL Intravenous Q12H  . sodium chloride flush  3 mL Intravenous Q12H  . spironolactone  25 mg Oral QHS    Vital Signs    Filed Vitals:   07/25/15 2018 07/26/15 0604 07/26/15 0757 07/26/15 1130  BP: 106/79 109/64 91/61 116/85  Pulse:  73 77 87  Temp:  97.4 F (36.3 C)  97.7 F (36.5 C)  TempSrc:  Oral    Resp: 20 18  17   Height:      Weight:      SpO2:  95% 100% 95%    Intake/Output Summary (Last 24 hours) at 07/26/15 1233 Last data filed at 07/26/15 1124  Gross per 24 hour  Intake    720 ml  Output   2510 ml  Net  -1790 ml   Filed Weights   07/23/15 0533 07/24/15 0428 07/25/15 0500  Weight: 298 lb 2.4 oz (135.24 kg) 300 lb 3.2 oz (136.17 kg) 307 lb 1.6 oz (139.3 kg)    Physical Exam    General: Morbidly obese Caucasian male appearing in no acute distress. Head: Normocephalic, atraumatic.  Neck: Supple without bruits, JVD unable to be assessed secondary to body habitus. Lungs:  Resp regular and unlabored, CTA without wheezing or rales. Improved aeration at the bases. Heart: RRR, S1, S2, no S3, S4, or murmur; no rub.  Abdomen: Soft, non-tender, non-distended with normoactive bowel sounds. No hepatomegaly. No rebound/guarding. No obvious abdominal masses. Extremities: No clubbing or cyanosis, 1+ edema bilaterally. Distal pedal pulses are 2+ bilaterally. Mild ecchymosis along right wrist. No evidence of a hematoma. Neuro: Alert and oriented X 3. Moves all extremities spontaneously. Psych: Normal affect.  Labs    CBC  Recent Labs  07/26/15 0445  WBC 6.0  HGB 12.1*  HCT 36.5*  MCV 96.6  PLT [id number]   Basic Metabolic Panel  Recent Labs  07/24/15 0627  NA 135  K 4.2  CL 97*  CO2 29  GLUCOSE 104*  BUN 54*  CREATININE 1.67*  CALCIUM 8.7*     Telemetry    NSR, HR in 80's - 90's. Occasional PVC's.  ECG    No new tracings.   Cardiac Studies and Radiology    US Renal: 07/24/2015  CLINICAL DATA:  Left-sided  kidney stone.  Difficulty urinating. EXAM: RENAL / URINARY TRACT ULTRASOUND COMPLETE COMPARISON:  Radiographs today. FINDINGS: Right Kidney: Length: 11.9 cm. Echogenicity within normal limits. No mass or hydronephrosis visualized. Left Kidney: Length: 14.1 cm. The left kidney is suboptimally visualized, partly obscured by bowel gas. There is a probable cyst in the interpolar region measuring approximately 4.1 x 3.6 x 4.1 cm. The left renal pelvis appears mildly distended. The calcifications demonstrated on the radiographs are not well seen, probably within the ureter. Bladder: Appears normal for degree of bladder distention. Ureteral jets were not visualized. Pelvic ascites noted. IMPRESSION: 1. Suboptimal visualization of the left kidney due to bowel gas. The left renal pelvis appears dilated, suggesting obstruction of the left ureter by the calcifications demonstrated on radiographs. 2. Left renal cyst. 3. Pelvic ascites. 4. CT suggested for further evaluation. Electronically Signed   By: Richardean Sale M.D.   On: 07/24/2015 19:32   Dg Abd 2 Views: 07/24/2015  CLINICAL DATA:  Left kidney stone.  Difficulty urinating. EXAM: ABDOMEN - 2 VIEW COMPARISON:  None. FINDINGS: Normal bowel gas pattern without free peritoneal air. Adjacent 2.8 x 1.2 cm and 1.4 x 0.9 cm oval calcifications in the expected position of the proximal to mid left ureter. Mild lumbar spine degenerative changes. IMPRESSION: 2.8 cm and 1.4 cm probable left ureteral calculi. Calcified lymph nodes are less likely. Electronically Signed   By: Claudie Revering M.D.   On: 07/24/2015 12:53   Ct Renal Stone Study: 07/25/2015  CLINICAL DATA:  Left flank pain.  History kidney stones. EXAM: CT ABDOMEN AND PELVIS WITHOUT CONTRAST TECHNIQUE: Multidetector CT imaging of the abdomen and pelvis was performed following the standard protocol without IV contrast. COMPARISON:  Renal ultrasound and abdominal radiographs 1 day prior. FINDINGS: Lower chest: Heart mildly  enlarged. Coronary artery calcifications are seen. There is a small left pleural effusion. Liver: Enlarged with diffusely decreased density consistent with steatosis. Hepatobiliary: Gallbladder physiologically distended, no calcified stone. No biliary dilatation. Pancreas: No ductal dilatation or inflammation. Spleen: Normal in size measuring 11.6 cm greatest dimension. Adrenal glands: No nodule. Kidneys: There are 2 large stones in the mid proximal left ureter. More proximal stone measures 1.3 x 1.1 x 2.2 cm, with an adjacent stone measuring 2.5 x 1.6 x 1.4 cm. Moderate proximal hydroureteronephrosis with increased density of the urine in the renal collecting system, suggestive of blood. Ureter distal to this is decompressed, however is an additional 0.9 x 1.0 x 0.6 cm stone in the left distal ureter just proximal to the ureterovesicular junction. There is thinning of the  left renal parenchyma. Small nonobstructing stone in the lower left kidney. Cyst in the interpolar left kidney measures 3.9 cm. No stones or obstructive uropathy of the right kidney. Right ureter is decompressed. Stomach/Bowel: Limited bowel assessment secondary to lack of enteric contrast, intra-abdominal ascites, and soft tissue attenuation from body habitus. Stomach physiologically distended with ingested contents. There are no dilated or thickened small bowel loops. Small volume of stool throughout the colon without colonic wall thickening. The appendix is not confidently identified. Vascular/Lymphatic: No retroperitoneal adenopathy. Abdominal aorta is normal in caliber moderate aortic atherosclerosis without aneurysm. Reproductive: Prostatic calcifications. Bladder: Decompressed, probable intraluminal blood. Other: Moderate volume of intra-abdominal and pelvic ascites. Free fluid in the mesenteric, pelvis, both pericolic gutters in both upper quadrants. Mild mesenteric and whole body wall edema. No free air. No loculated intra-abdominal fluid  collection. Musculoskeletal: There are no acute or suspicious osseous abnormalities. Facet arthropathy in the lower lumbar spine. IMPRESSION: 1. Left hydroureteronephrosis with 2 large stones in the mid proximal ureter measuring 2.2 and 2.5 cm. Ureter distal to this is decompressed, however there is an additional 1 cm stone in the distal left ureter, just proximal to the ureterovesicular junction. Increased density of the urine in the bladder and left renal collecting system consistent with hemorrhage. 2. Moderate volume intra-abdominal ascites. There is hepatic steatosis. 3. Atherosclerosis, including coronary artery calcifications. Cardiomegaly and small left effusion in the included lung bases. Electronically Signed   By: Jeb Levering M.D.   On: 07/25/2015 19:45    Cardiac Catheterization: 07/25/2015   Dist RCA lesion, 60% stenosed.  Prox LAD to Mid LAD lesion, 60% stenosed. The lesion was previously treated with a stent (unknown type) greater than two years ago.  Ost 2nd Diag to 2nd Diag lesion, 70% stenosed.  1. Moderately elevated filling pressures with moderate pulmonary hypertension. Mildly reduced cardiac output. Hemodynamic features are suggestive of restrictive physiology. 2. Moderate two-vessel coronary artery disease. Patent proximal LAD stent with 60% in-stent restenosis. The LAD and RCA are moderately calcified. 3. Severely reduced LV systolic function by echocardiogram. Left ventricular angiography was not performed due to chronic kidney disease.  PA pressure: 56/34 with a mean of 42, LV pressure: 95/15 with left ventricular end-diastolic pressure of 26. Cardiac output was 4.86 with a cardiac index of 1.9.  Recommendations: The patient is still volume overloaded and requires further diuresis. I'm going to increase the dose of furosemide and if needed metolazone can be added. I will resume small dose carvedilol. If diuresis continues to be difficult, renal dose milrinone can be  added instead of dobutamine given that the later caused significant tachycardia.  Assessment & Plan    1. Acute on chronic combined systolic and diastolic heart failure - EF of 15-20% with moderate mitral regurgitation and moderate pulmonary hypertension and severely dilated inferior vena cava.  - Slowly improving, taken off Dobutamine 07/21/2015 .  - Net output of  -15.1L this admission, with net output of -1.5L on 07/25/2015. TED hose in place. - continue Lasix 80mg  IV BID along with Metolazone, BB, and Spironolactone.  2. Newly diagnosed atrial fibrillation with rapid ventricular response - converted to NSR on 07/11/2015 - This patients CHA2DS2-VASc Score and unadjusted Ischemic Stroke Rate (% per year) is equal to 4.8 % stroke rate/year from a score of 4 (CHF, HTN, DM, Vascular). Eliquis held for potential Urology procedure.  - continue with PO Amiodarone 200 mg BID. Can likely transition to 200mg  daily in the next day or two. -  Telemetry shows no evidence of recurrent atrial fibrillation.  3. Coronary artery disease with previous PCI/ Elevated Troponin - Cath on 07/25/2015 showed moderate 2-vessel CAD with patent proximal LAD stent with 60% in-stent restenosis. - continue statin and BB.  4. Morbid obesity - BMI of 42.8 on admission. - Likely has sleep apnea as well, needs CPAP  5. Acute on chronic Stage 3 CKD/ Obstructing Left Ureteral Stones. - creatinine peaked at 2.13 on 07/13/2015. Improved to 1.67 on 07/24/2015. BMET for today is pending. - CT performed on 07/25/2015 showed left hydroureteronephrosis with 2 large stones in the mid proximal ureter measuring 2.2 and 2.5 cm. Urology says the stones appear to be chronic and do not see any need for urgent intervention. They are considering a potential stent vs. nephroureteral tube according to the patient.  6. Uncontrolled Type 2 DM - A1c 11.0 this admission - Drinks significant volume of sweet tea and reports he wants his first meal  at the time of discharge to be fried chicken - education has been provided by the Diabetes Coordinator as well as nursing staff and his providers.  7. OSA - reports his CPAP machine at home is broke. Unable to tolerate CPAP thus far this admission due to feeling like he is being smothered. - will likely need O2 going home for nocturnal use until he can have a repeat sleep study.  Signed, Erma Heritage , PA-C 12:33 PM 07/26/2015 Pager: [phone]

## 2015-07-26 NOTE — Progress Notes (Signed)
Inpatient Diabetes Program Recommendations  AACE/ADA: New Consensus Statement on Inpatient Glycemic Control (2015)  Target Ranges:  Prepandial:   less than 140 mg/dL      Peak postprandial:   less than 180 mg/dL (1-2 hours)      Critically ill patients:  140 - 180 mg/dL   Review of Glycemic Control  Results for KALDEN, DORIN (MRN [id number]) as of 07/26/2015 15:06  Ref. Range 07/25/2015 15:59 07/25/2015 21:26 07/26/2015 07:18 07/26/2015 08:17 07/26/2015 11:01  Glucose-Capillary Latest Ref Range: 65-99 mg/dL 152 (H) 154 (H) 56 (L) 65 156 (H)    Diabetes history: Diabetes Mellitus Outpatient Diabetes medications: Glipizide 20-30 mg daily, Metformin 1000 mg bid Current orders for Inpatient glycemic control: Lantus 20 units q HS, Novolog sensitive tid with meals and Novolog 0-5 units QHS, Novolog 7 units tid (hold if he eats less than 50%)  Inpatient Diabetes Program Recommendations: Low blood sugar yesterday and today- consider decreasing Lantus to 0.1unit/kg= Lantus 14 units daily.  Gentry Fitz, RN, BA, MHA, CDE Diabetes Coordinator Inpatient Diabetes Program  [phone] (Team Pager) [phone] ([place]) 07/26/2015 3:08 PM

## 2015-07-26 NOTE — Progress Notes (Signed)
Urology Consult Follow Up  Subjective: Patient resting comfortably.  He has no complaints this morning.  Discussion with cardiology and Dr. Earleen Newport still pending.    Anti-infectives: Anti-infectives    Start     Dose/Rate Route Frequency Ordered Stop   07/11/15 1400  cephALEXin (KEFLEX) capsule 500 mg  Status:  Discontinued     500 mg Oral 3 times per day 07/11/15 1149 07/17/15 1405   07/09/15 1245  cefTRIAXone (ROCEPHIN) 1 g in dextrose 5 % 50 mL IVPB  Status:  Discontinued     1 g 100 mL/hr over 30 Minutes Intravenous Every 24 hours 07/09/15 1230 07/11/15 1149      Current Facility-Administered Medications  Medication Dose Route Frequency Provider Last Rate Last Dose  . 0.9 %  sodium chloride infusion  250 mL Intravenous PRN Demetrios Loll, MD      . 0.9 %  sodium chloride infusion  250 mL Intravenous PRN Wellington Hampshire, MD      . acetaminophen (TYLENOL) tablet 650 mg  650 mg Oral Q6H PRN Demetrios Loll, MD   650 mg at 07/16/15 1654   Or  . acetaminophen (TYLENOL) suppository 650 mg  650 mg Rectal Q6H PRN Demetrios Loll, MD      . albuterol (PROVENTIL) (2.5 MG/3ML) 0.083% nebulizer solution 2.5 mg  2.5 mg Nebulization Q2H PRN Demetrios Loll, MD   2.5 mg at 07/11/15 0356  . ALPRAZolam Duanne Moron) tablet 0.25 mg  0.25 mg Oral Q8H PRN Saundra Shelling, MD   0.25 mg at 07/25/15 2216  . amiodarone (PACERONE) tablet 200 mg  200 mg Oral BID Erma Heritage, [place]   200 mg at 07/26/15 [id number]  . apixaban (ELIQUIS) tablet 5 mg  5 mg Oral BID Wellington Hampshire, MD   5 mg at 07/25/15 2216  . atorvastatin (LIPITOR) tablet 40 mg  40 mg Oral q1800 Demetrios Loll, MD   40 mg at 07/25/15 1717  . bisacodyl (DULCOLAX) suppository 10 mg  10 mg Rectal Daily PRN Anders Simmonds, MD   10 mg at 07/17/15 1855  . carvedilol (COREG) tablet 3.125 mg  3.125 mg Oral BID WC Wellington Hampshire, MD   Stopped at 07/26/15 [phone]  . diphenhydrAMINE (BENADRYL) capsule 25 mg  25 mg Oral Q6H PRN Wellington Hampshire, MD      . docusate sodium (COLACE) capsule  100 mg  100 mg Oral BID Anders Simmonds, MD   100 mg at 07/26/15 [id number]  . furosemide (LASIX) injection 80 mg  80 mg Intravenous BID Wellington Hampshire, MD   80 mg at 07/26/15 [id number]  . guaiFENesin-dextromethorphan (ROBITUSSIN DM) 100-10 MG/5ML syrup 5 mL  5 mL Oral Q4H PRN Lance Coon, MD   5 mL at 07/11/15 2014  . hydrocortisone (ANUSOL-HC) 2.5 % rectal cream   Rectal TID [name], MD      . hydrocortisone (ANUSOL-HC) suppository 25 mg  25 mg Rectal Once [name], MD   25 mg at 07/20/15 1615  . hydrocortisone-pramoxine (PROCTOFOAM-HC) rectal foam 1 applicator  1 applicator Rectal TID PRN [name], MD   1 applicator at [id number]  . insulin aspart (novoLOG) injection 0-5 Units  0-5 Units Subcutaneous QHS Demetrios Loll, MD   2 Units at 07/21/15 2058  . insulin aspart (novoLOG) injection 0-9 Units  0-9 Units Subcutaneous TID WC Demetrios Loll, MD   2 Units at 07/25/15 1719  . insulin aspart (novoLOG) injection 7 Units  7 Units Subcutaneous TID WC Hillary Bow, MD   7 Units at 07/25/15 1717  . insulin glargine (LANTUS) injection 20 Units  20 Units Subcutaneous QHS Loletha Grayer, MD   20 Units at 07/25/15 2216  . loratadine (CLARITIN) tablet 10 mg  10 mg Oral QHS Loletha Grayer, MD   10 mg at 07/25/15 2216  . magnesium citrate solution 1 Bottle  1 Bottle Oral Once [name], MD   Stopped at 07/18/15 1830  . metolazone (ZAROXOLYN) tablet 2.5 mg  2.5 mg Oral Daily Wellington Hampshire, MD   2.5 mg at 07/26/15 [id number]  . ondansetron (ZOFRAN) tablet 4 mg  4 mg Oral Q6H PRN Demetrios Loll, MD   4 mg at 07/11/15 2101   Or  . ondansetron (ZOFRAN) injection 4 mg  4 mg Intravenous Q6H PRN Demetrios Loll, MD   4 mg at 07/12/15 1407  . oxyCODONE-acetaminophen (PERCOCET/ROXICET) 5-325 MG per tablet 1 tablet  1 tablet Oral Q4H PRN Saundra Shelling, MD   1 tablet at 07/25/15 2216  . potassium chloride SA (K-DUR,KLOR-CON) CR tablet 20 mEq  20 mEq Oral Daily [name], MD   20 mEq at 07/26/15 0918  .  promethazine (PHENERGAN) tablet 12.5 mg  12.5 mg Oral Q4H PRN Erma Heritage, PA   12.5 mg at 07/12/15 1547  . sodium chloride flush (NS) 0.9 % injection 10-40 mL  10-40 mL Intracatheter Q12H Srikar Sudini, MD   10 mL at 07/26/15 0919  . sodium chloride flush (NS) 0.9 % injection 10-40 mL  10-40 mL Intracatheter PRN Srikar Sudini, MD      . sodium chloride flush (NS) 0.9 % injection 3 mL  3 mL Intravenous Q12H Demetrios Loll, MD   3 mL at 07/26/15 0920  . sodium chloride flush (NS) 0.9 % injection 3 mL  3 mL Intravenous PRN Demetrios Loll, MD   3 mL at 07/24/15 0908  . sodium chloride flush (NS) 0.9 % injection 3 mL  3 mL Intravenous Q12H Wellington Hampshire, MD   3 mL at 07/25/15 2217  . sodium chloride flush (NS) 0.9 % injection 3 mL  3 mL Intravenous PRN Wellington Hampshire, MD      . spironolactone (ALDACTONE) tablet 25 mg  25 mg Oral QHS Demetrios Loll, MD   25 mg at 07/25/15 2216     Objective: Vital signs in last 24 hours: Temp:  [97.4 F (36.3 C)] 97.4 F (36.3 C) (02/21 0604) Pulse Rate:  [73-98] 77 (02/21 0757) Resp:  [11-20] 18 (02/21 0604) BP: (91-113)/(61-83) 91/61 mmHg (02/21 0757) SpO2:  [95 %-100 %] 100 % (02/21 0757)  Intake/Output from previous day: 02/20 0701 - 02/21 0700 In: 240 [P.O.:240] Out: 1760 [Urine:1760] Intake/Output this shift: Total I/O In: -  Out: 350 [Urine:350]   Physical Exam Constitutional: Well nourished. Alert and oriented, No acute distress. Obese.   HEENT: [place] AT, moist mucus membranes. Trachea midline, no masses. Cardiovascular: No clubbing, cyanosis, or edema. Respiratory: Normal respiratory effort, no increased work of breathing. GI: Abdomen is soft, non tender, non distended, no abdominal masses. Liver and spleen not palpable.  No hernias appreciated.  Stool sample for occult testing is not indicated.   Lymph: No cervical or inguinal adenopathy. Neurologic: Grossly intact, no focal deficits, moving all 4 extremities. Psychiatric: Normal mood and  affect.  Lab Results:   Recent Labs  07/26/15 0445  WBC 6.0  HGB 12.1*  HCT 36.5*  PLT 230  BMET  Recent Labs  07/24/15 0627  NA 135  K 4.2  CL 97*  CO2 29  GLUCOSE 104*  BUN 54*  CREATININE 1.67*  CALCIUM 8.7*   PT/INR  Recent Labs  07/25/15 0019  LABPROT 21.2*  INR 1.84   ABG No results for input(s): PHART, HCO3 in the last 72 hours.  Invalid input(s): PCO2, PO2  Studies/Results: US Renal  07/24/2015  CLINICAL DATA:  Left-sided kidney stone.  Difficulty urinating. EXAM: RENAL / URINARY TRACT ULTRASOUND COMPLETE COMPARISON:  Radiographs today. FINDINGS: Right Kidney: Length: 11.9 cm. Echogenicity within normal limits. No mass or hydronephrosis visualized. Left Kidney: Length: 14.1 cm. The left kidney is suboptimally visualized, partly obscured by bowel gas. There is a probable cyst in the interpolar region measuring approximately 4.1 x 3.6 x 4.1 cm. The left renal pelvis appears mildly distended. The calcifications demonstrated on the radiographs are not well seen, probably within the ureter. Bladder: Appears normal for degree of bladder distention. Ureteral jets were not visualized. Pelvic ascites noted. IMPRESSION: 1. Suboptimal visualization of the left kidney due to bowel gas. The left renal pelvis appears dilated, suggesting obstruction of the left ureter by the calcifications demonstrated on radiographs. 2. Left renal cyst. 3. Pelvic ascites. 4. CT suggested for further evaluation. Electronically Signed   By: Richardean Sale M.D.   On: 07/24/2015 19:32   Dg Abd 2 Views  07/24/2015  CLINICAL DATA:  Left kidney stone.  Difficulty urinating. EXAM: ABDOMEN - 2 VIEW COMPARISON:  None. FINDINGS: Normal bowel gas pattern without free peritoneal air. Adjacent 2.8 x 1.2 cm and 1.4 x 0.9 cm oval calcifications in the expected position of the proximal to mid left ureter. Mild lumbar spine degenerative changes. IMPRESSION: 2.8 cm and 1.4 cm probable left ureteral calculi.  Calcified lymph nodes are less likely. Electronically Signed   By: Claudie Revering M.D.   On: 07/24/2015 12:53   Ct Renal Stone Study  07/25/2015  CLINICAL DATA:  Left flank pain.  History kidney stones. EXAM: CT ABDOMEN AND PELVIS WITHOUT CONTRAST TECHNIQUE: Multidetector CT imaging of the abdomen and pelvis was performed following the standard protocol without IV contrast. COMPARISON:  Renal ultrasound and abdominal radiographs 1 day prior. FINDINGS: Lower chest: Heart mildly enlarged. Coronary artery calcifications are seen. There is a small left pleural effusion. Liver: Enlarged with diffusely decreased density consistent with steatosis. Hepatobiliary: Gallbladder physiologically distended, no calcified stone. No biliary dilatation. Pancreas: No ductal dilatation or inflammation. Spleen: Normal in size measuring 11.6 cm greatest dimension. Adrenal glands: No nodule. Kidneys: There are 2 large stones in the mid proximal left ureter. More proximal stone measures 1.3 x 1.1 x 2.2 cm, with an adjacent stone measuring 2.5 x 1.6 x 1.4 cm. Moderate proximal hydroureteronephrosis with increased density of the urine in the renal collecting system, suggestive of blood. Ureter distal to this is decompressed, however is an additional 0.9 x 1.0 x 0.6 cm stone in the left distal ureter just proximal to the ureterovesicular junction. There is thinning of the left renal parenchyma. Small nonobstructing stone in the lower left kidney. Cyst in the interpolar left kidney measures 3.9 cm. No stones or obstructive uropathy of the right kidney. Right ureter is decompressed. Stomach/Bowel: Limited bowel assessment secondary to lack of enteric contrast, intra-abdominal ascites, and soft tissue attenuation from body habitus. Stomach physiologically distended with ingested contents. There are no dilated or thickened small bowel loops. Small volume of stool throughout the colon without colonic wall thickening. The  appendix is not  confidently identified. Vascular/Lymphatic: No retroperitoneal adenopathy. Abdominal aorta is normal in caliber moderate aortic atherosclerosis without aneurysm. Reproductive: Prostatic calcifications. Bladder: Decompressed, probable intraluminal blood. Other: Moderate volume of intra-abdominal and pelvic ascites. Free fluid in the mesenteric, pelvis, both pericolic gutters in both upper quadrants. Mild mesenteric and whole body wall edema. No free air. No loculated intra-abdominal fluid collection. Musculoskeletal: There are no acute or suspicious osseous abnormalities. Facet arthropathy in the lower lumbar spine. IMPRESSION: 1. Left hydroureteronephrosis with 2 large stones in the mid proximal ureter measuring 2.2 and 2.5 cm. Ureter distal to this is decompressed, however there is an additional 1 cm stone in the distal left ureter, just proximal to the ureterovesicular junction. Increased density of the urine in the bladder and left renal collecting system consistent with hemorrhage. 2. Moderate volume intra-abdominal ascites. There is hepatic steatosis. 3. Atherosclerosis, including coronary artery calcifications. Cardiomegaly and small left effusion in the included lung bases. Electronically Signed   By: Jeb Levering M.D.   On: 07/25/2015 19:45     Assessment: s/p Procedure(s): Right/Left Heart Cath and Coronary Angiography  1. Obstructing left ureteral stones/ left flank pain- CT scan reviewed. Results discussed with patient as well. 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  2. 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 CKD III, Cr near baseline ~1.4  4. Multiple medical comorbidities including CHF, anticoagulated state   Plan: -No acute/ urgent intervention warranted, stones appear to be very chronic in nature, pain minimal currently -Will discuss case with primary care Dr. Leslye Peer as well as cardiology tomorrow  (pending). Given the overall stone burden, treating the stones will not be trivial. Additionally, given the degree of ureteral obstruction, ureteral stenting would likely fail on the first attempt. As such, he will likely need a left percutaneous nephrostomy and ultimately a left nephroureteral tube followed by retrograde ureteroscopy versus ESWL for treatment of these ureteral stones. This will be in the form of multiple staged procedures to clear him of his stone burden and will require holding anticoagulation at least for a portion of the procedures under anesthesia. Other options include left nephrectomy given the overall appearance of his atrophic left kidney which is likely not contributing much to his overall renal function, however, given his overall health status, Dr. Erlene Quan does not feel that he's an ideal candidate for a major surgery currently.  -He will need to be medically optimized and cleared to hold anticoagulation for any elective procedure procedure moving forward -Should the patient become febrile or otherwise hemodynamically unstable concerning for sepsis, he will need emergent left percutaneous nephrostomy tube -The above was disclosed to the patient, will develop a plan for treatment after talking to his primary team -UA/ CBC to r/o infection -UA suspicious for infection, will send for culture   07/25/2015, 8:28 PM  Hollice Espy, MD     LOS: 18 days    [facility] 07/26/2015

## 2015-07-26 NOTE — Progress Notes (Signed)
PT Cancellation Note  Patient Details Name: [name] MRN: [id number] DOB: 1950/10/09   Cancelled Treatment:    Reason Eval/Treat Not Completed: Patient declined, no reason specified. Treatment attempted; pt refuses noting that he is to exhausted. Pt notes he is on increased diuretics and is getting up to void all of the time. Offered pt up in the chair and bed exercises with encouragement; pt continues to refuse. Re attempt tomorrow.    Charlaine Dalton, [place] 07/26/2015, 2:32 PM

## 2015-07-27 DIAGNOSIS — N1330 Unspecified hydronephrosis: Secondary | ICD-10-CM

## 2015-07-27 DIAGNOSIS — N132 Hydronephrosis with renal and ureteral calculous obstruction: Secondary | ICD-10-CM

## 2015-07-27 LAB — URINE CULTURE: Culture: NO GROWTH

## 2015-07-27 LAB — GLUCOSE, CAPILLARY
GLUCOSE-CAPILLARY: 215 mg/dL — AB (ref 65–99)
GLUCOSE-CAPILLARY: 197 mg/dL — AB (ref 65–99)
GLUCOSE-CAPILLARY: 163 mg/dL — AB (ref 65–99)
Glucose-Capillary: 69 mg/dL (ref 65–99)
Glucose-Capillary: 66 mg/dL (ref 65–99)

## 2015-07-27 LAB — BASIC METABOLIC PANEL
Anion gap: 9 (ref 5–15)
BUN: 64 mg/dL — AB (ref 6–20)
CO2: 30 mmol/L (ref 22–32)
CREATININE: 1.77 mg/dL — AB (ref 0.61–1.24)
Calcium: 8.5 mg/dL — ABNORMAL LOW (ref 8.9–10.3)
Chloride: 91 mmol/L — ABNORMAL LOW (ref 101–111)
GFR calc non Af Amer: 39 mL/min — ABNORMAL LOW (ref >60)
GFR, EST AFRICAN AMERICAN: 45 mL/min — AB (ref >60)
Glucose, Bld: 74 mg/dL (ref 65–99)
Potassium: 3.5 mmol/L (ref 3.5–5.1)
SODIUM: 130 mmol/L — AB (ref 135–145)

## 2015-07-27 MED ORDER — POTASSIUM CHLORIDE CRYS ER 20 MEQ PO TBCR
20.0000 meq | EXTENDED_RELEASE_TABLET | Freq: Once | ORAL | Status: AC
  Administered 2015-07-27: 20 meq via ORAL
  Filled 2015-07-27: qty 1

## 2015-07-27 MED ORDER — INSULIN GLARGINE 100 UNIT/ML ~~LOC~~ SOLN
10.0000 [IU] | Freq: Every day | SUBCUTANEOUS | Status: DC
  Administered 2015-07-27: 10 [IU] via SUBCUTANEOUS
  Filled 2015-07-27 (×2): qty 0.1

## 2015-07-27 NOTE — Progress Notes (Signed)
Patient seen briefly today.  Discussed case with interventional radiology, he scheduled for left nephrostomy versus nephroureteral stent on Friday. Prefer nephroureteral tube as this will help provide access to the ureter and facilitate treatment down the road.  The patient made aware plan. He will need to be nothing by mouth at midnight on Thursday.  Discuss case with Dr. Leslye Peer this AM.    Burnis Medin see patient again on Friday.  Hollice Espy, MD

## 2015-07-27 NOTE — Progress Notes (Signed)
Inpatient Diabetes Program Recommendations  AACE/ADA: New Consensus Statement on Inpatient Glycemic Control (2015)  Target Ranges:  Prepandial:   less than 140 mg/dL      Peak postprandial:   less than 180 mg/dL (1-2 hours)      Critically ill patients:  140 - 180 mg/dL   Results for ZEREK, MCCAMISH (MRN [id number]) as of 07/27/2015 10:11  Ref. Range 07/26/2015 07:18 07/26/2015 08:17 07/26/2015 11:01 07/26/2015 16:06 07/26/2015 20:35  Glucose-Capillary Latest Ref Range: 65-99 mg/dL 56 (L) 65 156 (H) 137 (H) 108 (H)   Results for KHAALID, TRUXILLO (MRN [id number]) as of 07/27/2015 10:11  Ref. Range 07/27/2015 07:24 07/27/2015 07:59  Glucose-Capillary Latest Ref Range: 65-99 mg/dL 69 66     Current Insulin Orders: Lantus 16 units QHS      Novolog Sensitive Correction Scale/ SSI (0-9 units) TID AC + HS      Novolog 7 units tidwc      MD- Note patient with Hypoglycemia again this AM after getting reduced dose of Lantus last PM (16 units QHS).  Please consider further reduction of Lantus to 10 units QHS      --Will follow patient during hospitalization--  Wyn Quaker RN, MSN, CDE Diabetes Coordinator Inpatient Glycemic Control Team Team Pager: [phone] (8a-5p)

## 2015-07-27 NOTE — Progress Notes (Addendum)
  Examined the patient on rounds with Dr. Ellyn Hack. Vitals and labs reviewed. We will continue with diuresis at current rate. No medication changes today. Eliquis held in preparation for upcoming urology procedure.  Signed, Erma Heritage, PA-C 07/27/2015, 2:12 PM Pager: [phone]  No active cardiac issues going on besides continue diuresis. Patient is started look and feel better. Hopefully with his urologic procedure, he we will build up safely continue diuresis. I suspect that after couple more days of diuresis he should be back to baseline.   Leonie Man, M.D., M.S.  [employer]  [address] [place], [place] [address] [phone] Fax [phone]

## 2015-07-27 NOTE — Progress Notes (Signed)
Physical Therapy Treatment Patient Details Name: [name] MRN: [id number] DOB: January 29, 1951 Today's Date: 07/27/2015    History of Present Illness Patient is a 64 y/o male that presents with orthopnea, nocturnal dyspnea, and LE swelling determined to have acute on chronic CHF. He was found to be in a-fib on admission and was started on amioderone drip. Became hypotensive on amio drip, which was dc'd.     PT Comments    Pt refuses to transfer or ambulate with physical therapy at this time despite heavy encouragement. He does agree to seated exercises eventually. Pt able to complete all seated exercises as instructed demonstrating good LE strength. Unable to update PT goals on this date due to refusal to transfer or ambulate. Will update at future sessions. Will continue to encourage ambulation with physical therapy at future sessions. Pt encouraged to ambulate with nursing staff frequently. Pt will benefit from skilled PT services to address deficits in strength, balance, and mobility in order to return to full function at home.   Follow Up Recommendations  Home health PT     Equipment Recommendations  Rolling walker with 5" wheels    Recommendations for Other Services       Precautions / Restrictions Precautions Precautions: Fall Restrictions Weight Bearing Restrictions: No    Mobility  Bed Mobility               General bed mobility comments: Received up in recliner  Transfers                 General transfer comment: Refuses to transfer or ambulate at this time  Ambulation/Gait             General Gait Details: Refuses   Stairs            Wheelchair Mobility    Modified Rankin (Stroke Patients Only)       Balance Overall balance assessment: Needs assistance Sitting-balance support: No upper extremity supported Sitting balance-Leahy Scale: Normal                              Cognition Arousal/Alertness:  Awake/alert Behavior During Therapy: WFL for tasks assessed/performed Overall Cognitive Status: Within Functional Limits for tasks assessed                      Exercises General Exercises - Lower Extremity Long Arc Quad: Strengthening;Both;15 reps;Seated;Other (comment) (2 sets) Heel Slides: Strengthening;Both;15 reps;Seated;Other (comment) (2 sets) Hip ABduction/ADduction: Strengthening;Both;15 reps;Seated;Other (comment) (2 sets) Hip Flexion/Marching: Strengthening;Both;15 reps;Seated;Other (comment) (2 sets) Heel Raises: Strengthening;Both;15 reps;Seated;Other (comment) (2 sets)    General Comments        Pertinent Vitals/Pain Pain Assessment: No/denies pain    Home Living                      Prior Function            PT Goals (current goals can now be found in the care plan section) Acute Rehab PT Goals Patient Stated Goal: To return home safely  PT Goal Formulation: With patient Time For Goal Achievement: 08/10/15 Potential to Achieve Goals: Good Progress towards PT goals: Progressing toward goals    Frequency  Min 2X/week    PT Plan Current plan remains appropriate    Co-evaluation             End of Session Equipment Utilized During Treatment: Oxygen (Pt on 2  L/min) Activity Tolerance: Patient tolerated treatment well Patient left: in chair;with call bell/phone within reach     Time: 1602-1625 PT Time Calculation (min) (ACUTE ONLY): 23 min  Charges:  $Therapeutic Exercise: 23-37 mins                    G Codes:      Lyndel Safe Marvell Tamer PT, DPT   Alfredia Desanctis 07/27/2015, 4:50 PM

## 2015-07-27 NOTE — Progress Notes (Signed)
A&O. Up with one assist. Medicated for pain once during the night. Labs drawn with no problem from PICC line. Plan for patient to receive nephrostomy tube in a few days, eloquis on hold.

## 2015-07-27 NOTE — Progress Notes (Signed)
Patient ID: [name], male   DOB: 1951/01/07, 65 y.o.   MRN: [id number]  [name] PROGRESS NOTE  [name] [id number] DOB: 1951-05-29 DOA: 07/08/2015 PCP: No PCP Per Patient  HPI/Subjective: Patient feeling okay today. Does have a little bit of flank pain.   Objective: Filed Vitals:   07/27/15 0821 07/27/15 1114  BP: 111/83 128/75  Pulse: 73 81  Temp: 97 F (36.1 C)   Resp: 20 18    Filed Weights   07/24/15 0428 07/25/15 0500 07/27/15 0600  Weight: 136.17 kg (300 lb 3.2 oz) 139.3 kg (307 lb 1.6 oz) 140.524 kg (309 lb 12.8 oz)    ROS: Review of Systems  Constitutional: Negative for fever and chills.  Eyes: Negative for blurred vision.  Respiratory: Positive for shortness of breath. Negative for cough.   Cardiovascular: Negative for chest pain.  Gastrointestinal: Negative for nausea, vomiting, abdominal pain, diarrhea and constipation.  Genitourinary: Positive for flank pain. Negative for dysuria.  Musculoskeletal: Negative for joint pain.  Neurological: Negative for dizziness and headaches.   Exam: Physical Exam  Constitutional: [name] oriented to person, place, and time.  HENT:  Nose: No mucosal edema.  Mouth/Throat: No oropharyngeal exudate or posterior oropharyngeal edema.  Eyes: Conjunctivae, EOM and lids are normal. Pupils are equal, round, and reactive to light.  Neck: No JVD present. Carotid bruit is not present. No edema present. No thyroid mass and no thyromegaly present.  Cardiovascular: S1 normal and S2 normal.  Exam reveals no gallop.   No murmur heard. Pulses:      Dorsalis pedis pulses are 2+ on the right side, and 2+ on the left side.  Respiratory: No respiratory distress. He has no wheezes. He has no rhonchi. He has rales in the right lower field and the left lower field.  GI: Soft. Bowel sounds are normal. There is no tenderness.  Musculoskeletal:       Right ankle: He exhibits swelling.       Left ankle: He exhibits swelling.   Lymphadenopathy:    He has no cervical adenopathy.  Neurological: [name] alert and oriented to person, place, and time. No cranial nerve deficit.  Skin: Skin is warm. No rash noted. Nails show no clubbing.  Psychiatric: He has a normal mood and affect.      Data Reviewed: Basic Metabolic Panel:  Recent Labs Lab 07/22/15 0416 07/23/15 0500 07/24/15 0627 07/27/15 0500  NA 136 135 135 130*  K 4.1 4.2 4.2 3.5  CL 97* 97* 97* 91*  CO2 32 30 29 30   GLUCOSE 100* 142* 104* 74  BUN 49* 52* 54* 64*  CREATININE 1.48* 1.60* 1.67* 1.77*  CALCIUM 8.8* 8.6* 8.7* 8.5*   CBC:  Recent Labs Lab 07/21/15 0615 07/23/15 0500 07/26/15 0445  WBC 5.2  --  6.0  HGB 11.4*  --  12.1*  HCT 34.6*  --  36.5*  MCV 95.6  --  96.6  PLT 127* 203 230    CBG:  Recent Labs Lab 07/26/15 1606 07/26/15 2035 07/27/15 0724 07/27/15 0759 07/27/15 1112  GLUCAP 137* 108* 69 66 215*    Recent Results (from the past 240 hour(s))  MRSA PCR Screening     Status: None   Collection Time: 07/18/15  2:42 PM  Result Value Ref Range Status   MRSA by PCR NEGATIVE NEGATIVE Final    Comment:        The GeneXpert MRSA Assay (FDA approved for NASAL specimens only), is  one component of a comprehensive MRSA colonization surveillance program. It is not intended to diagnose MRSA infection nor to guide or monitor treatment for MRSA infections.   Urine culture     Status: None   Collection Time: 07/26/15 11:19 AM  Result Value Ref Range Status   Specimen Description URINE, RANDOM  Final   Special Requests NONE  Final   Culture NO GROWTH 1 DAY  Final   Report Status 07/27/2015 FINAL  Final     Scheduled Meds: . amiodarone  200 mg Oral BID  . atorvastatin  40 mg Oral q1800  . carvedilol  3.125 mg Oral BID WC  . docusate sodium  100 mg Oral BID  . enoxaparin (LOVENOX) injection  40 mg Subcutaneous Q24H  . furosemide  80 mg Intravenous BID  . hydrocortisone   Rectal TID  . hydrocortisone  25 mg  Rectal Once  . insulin aspart  0-5 Units Subcutaneous QHS  . insulin aspart  0-9 Units Subcutaneous TID WC  . insulin aspart  7 Units Subcutaneous TID WC  . insulin glargine  10 Units Subcutaneous QHS  . loratadine  10 mg Oral QHS  . magnesium citrate  1 Bottle Oral Once  . metolazone  2.5 mg Oral Daily  . polycarbophil  625 mg Oral Daily  . potassium chloride  20 mEq Oral Daily  . sodium chloride flush  10-40 mL Intracatheter Q12H  . sodium chloride flush  3 mL Intravenous Q12H  . sodium chloride flush  3 mL Intravenous Q12H  . spironolactone  25 mg Oral QHS    Assessment/Plan:  1.  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 Continue IV diuresis with Lasix 80 mg IV twice a day. Continue spironolactone. Low-dose beta blocker if blood pressure will tolerate and metolazone started. Cardiology still wants to do IV diuresis.  2. Atrial fibrillation continue amiodarone for rate control. Eliquis now on hold for urology procedure. 3. Hyperlipidemia unspecified continue atorvastatin 4. Bilateral lower extremity cellulitis- finished antibiotics 5. Type 2 diabetes without complication continue low-dose Lantus and sliding scale 6. Anxiety on Xanax as needed 7. Chronic left nephrolithiasis with chronic hydronephrosis. Hold eliquis. Urology to speak with interventional radiology about a procedure on Thursday or Friday.  Code Status:     Code Status Orders        Start     Ordered   07/15/15 2355  Full code   Continuous     07/15/15 2354    Code Status History    Date Active Date Inactive Code Status Order ID Comments User Context   07/08/2015 10:28 PM 07/15/2015 11:54 PM Full Code [id number]  Jim Loll, MD Inpatient     Disposition Plan: To be determined  Consultants:  Cardiology  Urology  Time spent: 20 minutes  [name]  [name], [name]

## 2015-07-28 ENCOUNTER — Other Ambulatory Visit: Payer: Self-pay | Admitting: Radiology

## 2015-07-28 LAB — GLUCOSE, CAPILLARY
GLUCOSE-CAPILLARY: 228 mg/dL — AB (ref 65–99)
Glucose-Capillary: 150 mg/dL — ABNORMAL HIGH (ref 65–99)
Glucose-Capillary: 133 mg/dL — ABNORMAL HIGH (ref 65–99)
Glucose-Capillary: 140 mg/dL — ABNORMAL HIGH (ref 65–99)

## 2015-07-28 MED ORDER — INSULIN GLARGINE 100 UNIT/ML ~~LOC~~ SOLN
5.0000 [IU] | Freq: Every day | SUBCUTANEOUS | Status: DC
  Administered 2015-07-28 – 2015-07-31 (×3): 5 [IU] via SUBCUTANEOUS
  Filled 2015-07-28 (×5): qty 0.05

## 2015-07-28 MED ORDER — FUROSEMIDE 40 MG PO TABS
80.0000 mg | ORAL_TABLET | Freq: Two times a day (BID) | ORAL | Status: DC
  Administered 2015-07-28 – 2015-08-01 (×8): 80 mg via ORAL
  Filled 2015-07-28 (×8): qty 2

## 2015-07-28 MED ORDER — AMMONIUM LACTATE 12 % EX LOTN
TOPICAL_LOTION | CUTANEOUS | Status: DC | PRN
Start: 2015-07-28 — End: 2015-08-01
  Filled 2015-07-28: qty 400

## 2015-07-28 MED ORDER — AMIODARONE HCL 200 MG PO TABS
200.0000 mg | ORAL_TABLET | Freq: Every day | ORAL | Status: DC
  Administered 2015-07-29 – 2015-08-01 (×4): 200 mg via ORAL
  Filled 2015-07-28 (×4): qty 1

## 2015-07-28 NOTE — Progress Notes (Signed)
Hospital Problem List     Principal Problem:   Acute on chronic combined systolic and diastolic CHF (congestive heart failure) (HCC) Active Problems:   Elevated troponin   Pulmonary hypertension (HCC)   Cardiorenal syndrome   Acute on chronic renal failure (HCC)   Morbid obesity due to excess calories (HCC)   Paroxysmal atrial fibrillation (HCC)   Orthostatic hypotension   Hyponatremia   Uncontrolled type 2 diabetes mellitus (HCC)   OSA (obstructive sleep apnea)   Kidney stone on left side   Hydronephrosis, left    Patient Profile:   65 y.o. male w/ PMH of CAD (s/p remote PCI and stenting 25 years ago), ischemic cardiomyopathy (EF 15-20% this admission), obesity, OSA, HTN, and DM admitted to [facility] on 07/08/2015 with acute on chronic combined systolic and diastolic CHF.  Subjective   He feels better overall. He has nephrolithiasis and going for procedure tomorrow. His weight seems to be stable.  Inpatient Medications    . amiodarone  200 mg Oral BID  . atorvastatin  40 mg Oral q1800  . carvedilol  3.125 mg Oral BID WC  . docusate sodium  100 mg Oral BID  . furosemide  80 mg Oral BID  . hydrocortisone   Rectal TID  . hydrocortisone  25 mg Rectal Once  . insulin aspart  0-5 Units Subcutaneous QHS  . insulin aspart  0-9 Units Subcutaneous TID WC  . insulin aspart  7 Units Subcutaneous TID WC  . insulin glargine  10 Units Subcutaneous QHS  . loratadine  10 mg Oral QHS  . magnesium citrate  1 Bottle Oral Once  . metolazone  2.5 mg Oral Daily  . polycarbophil  625 mg Oral Daily  . potassium chloride  20 mEq Oral Daily  . sodium chloride flush  10-40 mL Intracatheter Q12H  . sodium chloride flush  3 mL Intravenous Q12H  . sodium chloride flush  3 mL Intravenous Q12H  . spironolactone  25 mg Oral QHS    Vital Signs    Filed Vitals:   07/27/15 1114 07/27/15 2021 07/28/15 0452 07/28/15 0911  BP: 128/75 104/73 102/67 119/83  Pulse: 81 81 69 82  Temp:  97.6 F (36.4 C)     TempSrc:  Oral    Resp: 18 16 16 18   Height:      Weight:   306 lb 1.6 oz (138.846 kg)   SpO2: 100% 100% 98% 100%    Intake/Output Summary (Last 24 hours) at 07/28/15 1016 Last data filed at 07/28/15 0735  Gross per 24 hour  Intake    480 ml  Output   3350 ml  Net  -2870 ml   Filed Weights   07/25/15 0500 07/27/15 0600 07/28/15 0452  Weight: 307 lb 1.6 oz (139.3 kg) 309 lb 12.8 oz (140.524 kg) 306 lb 1.6 oz (138.846 kg)    Physical Exam    General: Morbidly obese Caucasian male appearing in no acute distress. Head: Normocephalic, atraumatic.  Neck: Supple without bruits, JVD unable to be assessed secondary to body habitus. Lungs:  Resp regular and unlabored, CTA without wheezing or rales. Improved aeration at the bases. Heart: RRR, S1, S2, no S3, S4, or murmur; no rub. Abdomen: Soft, non-tender, non-distended with normoactive bowel sounds. No hepatomegaly. No rebound/guarding. No obvious abdominal masses. Extremities: No clubbing or cyanosis, 1+ edema bilaterally. Distal pedal pulses are 2+ bilaterally. Mild ecchymosis along right wrist. No evidence of a hematoma. Neuro: Alert and oriented X  3. Moves all extremities spontaneously. Psych: Normal affect.  Labs    CBC  Recent Labs  07/26/15 0445  WBC 6.0  HGB 12.1*  HCT 36.5*  MCV 96.6  PLT 123456   Basic Metabolic Panel  Recent Labs  07/27/15 0500  NA 130*  K 3.5  CL 91*  CO2 30  GLUCOSE 74  BUN 64*  CREATININE 1.77*  CALCIUM 8.5*     Telemetry    NSR, HR in 80's - 90's. Occasional PVC's.  ECG    No new tracings.   Cardiac Studies and Radiology    US Renal: 07/24/2015  CLINICAL DATA:  Left-sided kidney stone.  Difficulty urinating. EXAM: RENAL / URINARY TRACT ULTRASOUND COMPLETE COMPARISON:  Radiographs today. FINDINGS: Right Kidney: Length: 11.9 cm. Echogenicity within normal limits. No mass or hydronephrosis visualized. Left Kidney: Length: 14.1 cm. The left kidney is suboptimally visualized,  partly obscured by bowel gas. There is a probable cyst in the interpolar region measuring approximately 4.1 x 3.6 x 4.1 cm. The left renal pelvis appears mildly distended. The calcifications demonstrated on the radiographs are not well seen, probably within the ureter. Bladder: Appears normal for degree of bladder distention. Ureteral jets were not visualized. Pelvic ascites noted. IMPRESSION: 1. Suboptimal visualization of the left kidney due to bowel gas. The left renal pelvis appears dilated, suggesting obstruction of the left ureter by the calcifications demonstrated on radiographs. 2. Left renal cyst. 3. Pelvic ascites. 4. CT suggested for further evaluation. Electronically Signed   By: Richardean Sale M.D.   On: 07/24/2015 19:32   Dg Abd 2 Views: 07/24/2015  CLINICAL DATA:  Left kidney stone.  Difficulty urinating. EXAM: ABDOMEN - 2 VIEW COMPARISON:  None. FINDINGS: Normal bowel gas pattern without free peritoneal air. Adjacent 2.8 x 1.2 cm and 1.4 x 0.9 cm oval calcifications in the expected position of the proximal to mid left ureter. Mild lumbar spine degenerative changes. IMPRESSION: 2.8 cm and 1.4 cm probable left ureteral calculi. Calcified lymph nodes are less likely. Electronically Signed   By: Claudie Revering M.D.   On: 07/24/2015 12:53   Ct Renal Stone Study: 07/25/2015  CLINICAL DATA:  Left flank pain.  History kidney stones. EXAM: CT ABDOMEN AND PELVIS WITHOUT CONTRAST TECHNIQUE: Multidetector CT imaging of the abdomen and pelvis was performed following the standard protocol without IV contrast. COMPARISON:  Renal ultrasound and abdominal radiographs 1 day prior. FINDINGS: Lower chest: Heart mildly enlarged. Coronary artery calcifications are seen. There is a small left pleural effusion. Liver: Enlarged with diffusely decreased density consistent with steatosis. Hepatobiliary: Gallbladder physiologically distended, no calcified stone. No biliary dilatation. Pancreas: No ductal dilatation or  inflammation. Spleen: Normal in size measuring 11.6 cm greatest dimension. Adrenal glands: No nodule. Kidneys: There are 2 large stones in the mid proximal left ureter. More proximal stone measures 1.3 x 1.1 x 2.2 cm, with an adjacent stone measuring 2.5 x 1.6 x 1.4 cm. Moderate proximal hydroureteronephrosis with increased density of the urine in the renal collecting system, suggestive of blood. Ureter distal to this is decompressed, however is an additional 0.9 x 1.0 x 0.6 cm stone in the left distal ureter just proximal to the ureterovesicular junction. There is thinning of the left renal parenchyma. Small nonobstructing stone in the lower left kidney. Cyst in the interpolar left kidney measures 3.9 cm. No stones or obstructive uropathy of the right kidney. Right ureter is decompressed. Stomach/Bowel: Limited bowel assessment secondary to lack of enteric contrast, intra-abdominal ascites,  and soft tissue attenuation from body habitus. Stomach physiologically distended with ingested contents. There are no dilated or thickened small bowel loops. Small volume of stool throughout the colon without colonic wall thickening. The appendix is not confidently identified. Vascular/Lymphatic: No retroperitoneal adenopathy. Abdominal aorta is normal in caliber moderate aortic atherosclerosis without aneurysm. Reproductive: Prostatic calcifications. Bladder: Decompressed, probable intraluminal blood. Other: Moderate volume of intra-abdominal and pelvic ascites. Free fluid in the mesenteric, pelvis, both pericolic gutters in both upper quadrants. Mild mesenteric and whole body wall edema. No free air. No loculated intra-abdominal fluid collection. Musculoskeletal: There are no acute or suspicious osseous abnormalities. Facet arthropathy in the lower lumbar spine. IMPRESSION: 1. Left hydroureteronephrosis with 2 large stones in the mid proximal ureter measuring 2.2 and 2.5 cm. Ureter distal to this is decompressed, however there  is an additional 1 cm stone in the distal left ureter, just proximal to the ureterovesicular junction. Increased density of the urine in the bladder and left renal collecting system consistent with hemorrhage. 2. Moderate volume intra-abdominal ascites. There is hepatic steatosis. 3. Atherosclerosis, including coronary artery calcifications. Cardiomegaly and small left effusion in the included lung bases. Electronically Signed   By: Jeb Levering M.D.   On: 07/25/2015 19:45    Cardiac Catheterization: 07/25/2015   Dist RCA lesion, 60% stenosed.  Prox LAD to Mid LAD lesion, 60% stenosed. The lesion was previously treated with a stent (unknown type) greater than two years ago.  Ost 2nd Diag to 2nd Diag lesion, 70% stenosed.  1. Moderately elevated filling pressures with moderate pulmonary hypertension. Mildly reduced cardiac output. Hemodynamic features are suggestive of restrictive physiology. 2. Moderate two-vessel coronary artery disease. Patent proximal LAD stent with 60% in-stent restenosis. The LAD and RCA are moderately calcified. 3. Severely reduced LV systolic function by echocardiogram. Left ventricular angiography was not performed due to chronic kidney disease.  PA pressure: 56/34 with a mean of 42, LV pressure: 95/15 with left ventricular end-diastolic pressure of 26. Cardiac output was 4.86 with a cardiac index of 1.9.  Recommendations: The patient is still volume overloaded and requires further diuresis. I'm going to increase the dose of furosemide and if needed metolazone can be added. I will resume small dose carvedilol. If diuresis continues to be difficult, renal dose milrinone can be added instead of dobutamine given that the later caused significant tachycardia.  Assessment & Plan    1. Acute on chronic combined systolic and diastolic heart failure - EF of 15-20% with moderate mitral regurgitation and moderate pulmonary hypertension and severely dilated inferior vena  cava.  - Slowly improving, taken off Dobutamine 07/21/2015 .  -  His volume status seems to be improved. Thus, I elected to switch furosemide to 80 mg by mouth twice daily Along with Metolazone, BB, and Spironolactone.  This will give [place] a chance to monitoring him with oral diuresis before he goes home.  2. Newly diagnosed atrial fibrillation with rapid ventricular response - converted to NSR on 07/11/2015 - This patients CHA2DS2-VASc Score and unadjusted Ischemic Stroke Rate (% per year) is equal to 4.8 % stroke rate/year from a score of 4 (CHF, HTN, DM, Vascular). Eliquis held for potential Urology procedure.  -I decreased amiodarone to 200 mg once daily. - Telemetry shows no evidence of recurrent atrial fibrillation.  3. Coronary artery disease with previous PCI/ Elevated Troponin - Cath on 07/25/2015 showed moderate 2-vessel CAD with patent proximal LAD stent with 60% in-stent restenosis. - continue statin and BB.  4. Morbid  obesity - BMI of 42.8 on admission. - Likely has sleep apnea as well, needs CPAP  5. Acute on chronic Stage 3 CKD/ Obstructing Left Ureteral Stones. Renal function is stable.  6. Uncontrolled Type 2 DM - A1c 11.0 this admission - Drinks significant volume of sweet tea and reports he wants his first meal at the time of discharge to be fried chicken - education has been provided by the Diabetes Coordinator as well as nursing staff and his providers.  7. OSA - reports his CPAP machine at home is broke. Unable to tolerate CPAP thus far this admission due to feeling like he is being smothered. - will likely need O2 going home for nocturnal use until he can have a repeat sleep study.  Signed, Kathlyn Sacramento , MD 10:16 AM 07/28/2015

## 2015-07-28 NOTE — Progress Notes (Signed)
Inpatient Diabetes Program Recommendations  AACE/ADA: New Consensus Statement on Inpatient Glycemic Control (2015)  Target Ranges:  Prepandial:   less than 140 mg/dL      Peak postprandial:   less than 180 mg/dL (1-2 hours)      Critically ill patients:  140 - 180 mg/dL   Results for THESEUS, MALBROUGH (MRN [id number]) as of 07/28/2015 15:31  Ref. Range 07/28/2015 09:04 07/28/2015 11:37  Glucose-Capillary Latest Ref Range: 65-99 mg/dL 150 (H) 228 (H)   Review of Glycemic Control  Current orders for Inpatient glycemic control: Lantus 5 units QHS, Novolog Sensitive + HS scale + Novolog 7 units TID meal coverage  Inpatient Diabetes Program Recommendations:  Insulin - Basal: Patient received Lantus 10 units last night. Fasting glucose was within goal in the 150's. Please consider increasing Lantus back up to 10 units QHS.  Insulin - Meal Coverage: Please consider increasing meal coverage to 8 units TID with meals. HgbA1C: A1C was 11% on 07/08/15 indicating an average glucose of 269 mg/dl over the past 2-3 months. Patient will need to follow up with PCP regarding glycemic control.  Thanks,  Tama Headings RN, MSN, [facility] Inpatient Diabetes Coordinator Team Pager [phone] (8a-5p)

## 2015-07-28 NOTE — Progress Notes (Signed)
Physical Therapy Treatment Patient Details Name: [name] MRN: [id number] DOB: 1950/09/29 Today's Date: 07/28/2015    History of Present Illness Patient is a 65 y/o male that presents with orthopnea, nocturnal dyspnea, and LE swelling determined to have acute on chronic CHF. He was found to be in a-fib on admission and was started on amioderone drip. Became hypotensive on amio drip, which was dc'd.     PT Comments    Pt in bed upon arrival.  Pt initially declined intervention but agreed to exercise and out of bed with encouragement.  He refused ambulation due to complaints of bilateral foot pain with weight bearing but stated he was having no difficulty walking to the bathroom with nursing.  [name] not using an assistive device in his room. He was able to sit edge of bed unsupported without loss of balance for 15 minutes during exercises.   Follow Up Recommendations  Home health PT     Equipment Recommendations  Rolling walker with 5" wheels    Recommendations for Other Services       Precautions / Restrictions Precautions Precautions: Fall Restrictions Weight Bearing Restrictions: No    Mobility  Bed Mobility Overal bed mobility: Modified Independent Bed Mobility: Supine to Sit     Supine to sit: Modified independent (Device/Increase time) (used rail)        Transfers Overall transfer level: Modified independent               General transfer comment: no lob's  steady  Ambulation/Gait             General Gait Details: Refuses (reports amb to bathroom with nsg, declines due to foot pain)   Stairs            Wheelchair Mobility    Modified Rankin (Stroke Patients Only)       Balance Overall balance assessment: Modified Independent Sitting-balance support: No upper extremity supported;Feet supported Sitting balance-Leahy Scale: Normal                              Cognition Arousal/Alertness: Awake/alert Behavior During  Therapy: WFL for tasks assessed/performed Overall Cognitive Status: Within Functional Limits for tasks assessed                      Exercises General Exercises - Lower Extremity Long Arc Quad: AROM;Both;20 reps;Seated Hip ABduction/ADduction: AROM;Seated Hip Flexion/Marching: AROM;Both;20 reps;Seated Heel Raises: AROM;Both;20 reps;Seated    General Comments        Pertinent Vitals/Pain Pain Assessment: 0-10 Pain Score: 4  Pain Location: Bilateral feet when weight bearing    Home Living                      Prior Function            PT Goals (current goals can now be found in the care plan section) Progress towards PT goals: Progressing toward goals    Frequency  Min 2X/week    PT Plan Current plan remains appropriate    Co-evaluation             End of Session Equipment Utilized During Treatment: Oxygen Activity Tolerance: Patient tolerated treatment well Patient left: in chair;with call bell/phone within reach     Time: 1140-1205 PT Time Calculation (min) (ACUTE ONLY): 25 min  Charges:  $Therapeutic Exercise: 23-37 mins  G Codes:      Chesley Noon, PTA 07/28/2015, 12:49 PM

## 2015-07-28 NOTE — Care Management (Signed)
Patient hs qualified for charity care through [place].  Cariology completed Eliquis Assistance form and faxed it to med management clinic.  Will ask cardiology to sign orders for home health

## 2015-07-28 NOTE — Progress Notes (Signed)
Nutrition Follow-up  RD has been following pt; pt is meeting nutritional needs as pt eating 100% of meals. Education has been attempted. No further interventions warranted at this time. Will sign off. Please re-consult if nutrition issues arise  Kerman Passey MS, [place], LDN [phone] Pager  [phone] Weekend/On-Call Pager

## 2015-07-28 NOTE — Care Management (Addendum)
Changed to oral diuretics.  Patient is scheduled for nephrostomy tube or uretheral stent.  Left message for cariology in regards to complete.  Asked [facility] to assess for charity care criteria

## 2015-07-28 NOTE — Progress Notes (Signed)
Patient ID: [name], male   DOB: 1950/08/24, 65 y.o.   MRN: [id number]  [facility] PROGRESS NOTE  [name] [id number] DOB: 02/13/1951 DOA: 07/08/2015 PCP: No PCP Per Patient  HPI/Subjective: Patient states that he is given a have a urological procedure tomorrow morning. Patient feels well. His breathing is better. Does have some flank pain. Patient hoping to go home soon.  Objective: Filed Vitals:   07/28/15 0911 07/28/15 1148  BP: 119/83 120/78  Pulse: 82 79  Temp:    Resp: 18 18    Filed Weights   07/25/15 0500 07/27/15 0600 07/28/15 0452  Weight: 139.3 kg (307 lb 1.6 oz) 140.524 kg (309 lb 12.8 oz) 138.846 kg (306 lb 1.6 oz)    ROS: Review of Systems  Constitutional: Negative for fever and chills.  Eyes: Negative for blurred vision.  Respiratory: Positive for shortness of breath. Negative for cough.   Cardiovascular: Negative for chest pain.  Gastrointestinal: Negative for nausea, vomiting, abdominal pain, diarrhea and constipation.  Genitourinary: Positive for flank pain. Negative for dysuria.  Musculoskeletal: Negative for joint pain.  Neurological: Negative for dizziness and headaches.   Exam: Physical Exam  Constitutional: [name] oriented to person, place, and time.  HENT:  Nose: No mucosal edema.  Mouth/Throat: No oropharyngeal exudate or posterior oropharyngeal edema.  Eyes: Conjunctivae, EOM and lids are normal. Pupils are equal, round, and reactive to light.  Neck: No JVD present. Carotid bruit is not present. No edema present. No thyroid mass and no thyromegaly present.  Cardiovascular: S1 normal and S2 normal.  Exam reveals no gallop.   No murmur heard. Pulses:      Dorsalis pedis pulses are 2+ on the right side, and 2+ on the left side.  Respiratory: No respiratory distress. He has no wheezes. He has no rhonchi. He has no rales.  GI: Soft. Bowel sounds are normal. There is no tenderness.  Musculoskeletal:       Right ankle: He  exhibits swelling.       Left ankle: He exhibits swelling.  Lymphadenopathy:    He has no cervical adenopathy.  Neurological: [name] alert and oriented to person, place, and time. No cranial nerve deficit.  Skin: Skin is warm. Nails show no clubbing.  Scaling bilateral lower extremity feet. Toes bilaterally erythematous and swollen. Chronic lower extremity discoloration bilateral lower extremities. Looks like a plaque psoriasis on her arms.  Psychiatric: He has a normal mood and affect.      Data Reviewed: Basic Metabolic Panel:  Recent Labs Lab 07/22/15 0416 07/23/15 0500 07/24/15 0627 07/27/15 0500  NA 136 135 135 130*  K 4.1 4.2 4.2 3.5  CL 97* 97* 97* 91*  CO2 32 30 29 30   GLUCOSE 100* 142* 104* 74  BUN 49* 52* 54* 64*  CREATININE 1.48* 1.60* 1.67* 1.77*  CALCIUM 8.8* 8.6* 8.7* 8.5*   CBC:  Recent Labs Lab 07/23/15 0500 07/26/15 0445  WBC  --  6.0  HGB  --  12.1*  HCT  --  36.5*  MCV  --  96.6  PLT 203 230    CBG:  Recent Labs Lab 07/27/15 1112 07/27/15 1630 07/27/15 2023 07/28/15 0904 07/28/15 1137  GLUCAP 215* 197* 163* 150* 228*    Recent Results (from the past 240 hour(s))  Urine culture     Status: None   Collection Time: 07/26/15 11:19 AM  Result Value Ref Range Status   Specimen Description URINE, RANDOM  Final  Special Requests NONE  Final   Culture NO GROWTH 1 DAY  Final   Report Status 07/27/2015 FINAL  Final     Scheduled Meds: . [START ON 07/29/2015] amiodarone  200 mg Oral Daily  . atorvastatin  40 mg Oral q1800  . carvedilol  3.125 mg Oral BID WC  . docusate sodium  100 mg Oral BID  . furosemide  80 mg Oral BID  . hydrocortisone   Rectal TID  . hydrocortisone  25 mg Rectal Once  . insulin aspart  0-5 Units Subcutaneous QHS  . insulin aspart  0-9 Units Subcutaneous TID WC  . insulin aspart  7 Units Subcutaneous TID WC  . insulin glargine  5 Units Subcutaneous QHS  . loratadine  10 mg Oral QHS  . magnesium citrate  1 Bottle  Oral Once  . metolazone  2.5 mg Oral Daily  . polycarbophil  625 mg Oral Daily  . potassium chloride  20 mEq Oral Daily  . sodium chloride flush  10-40 mL Intracatheter Q12H  . sodium chloride flush  3 mL Intravenous Q12H  . sodium chloride flush  3 mL Intravenous Q12H  . spironolactone  25 mg Oral QHS    Assessment/Plan:  1.  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 Switch diuresis with Lasix 80 mg po twice a day. Patient also on middle low zone and spironolactone. Patient is on low-dose Coreg. Not a candidate for interested with blood pressure being on the lower side. Can consider ACE inhibitor as outpatient. Right now creatinine a little too borderline for ACE inhibitor  2. Atrial fibrillation continue amiodarone for rate control. Eliquis now on hold for urology procedure. Patient assistance form for eliquis filled out. 3. Hyperlipidemia unspecified continue atorvastatin 4. Bilateral lower extremity cellulitis- finished antibiotics 5. Type 2 diabetes without complication continue low-dose Lantus and sliding scale 6. Anxiety on Xanax as needed 7. Chronic left nephrolithiasis with chronic hydronephrosis. Hold eliquis. Urology spoke with interventional radiology about a procedure on Friday. 8. Lac-Hydrin lotion for the feet.  Code Status:     Code Status Orders        Start     Ordered   07/15/15 2355  Full code   Continuous     07/15/15 2354    Code Status History    Date Active Date Inactive Code Status Order ID Comments User Context   07/08/2015 10:28 PM 07/15/2015 11:54 PM Full Code [id number]  Demetrios Loll, MD Inpatient     Disposition Plan: To be determined  Consultants:  Cardiology  Urology  Time spent: 20 minutes  Loletha Grayer  [facility]

## 2015-07-29 ENCOUNTER — Inpatient Hospital Stay
Admit: 2015-07-29 | Discharge: 2015-07-29 | Disposition: A | Discharge status: Home / Self Care | Payer: Medicaid Other | Attending: Urology | Admitting: Urology

## 2015-07-29 ENCOUNTER — Inpatient Hospital Stay: Payer: Medicaid Other

## 2015-07-29 LAB — BASIC METABOLIC PANEL
Anion gap: 11 (ref 5–15)
BUN: 67 mg/dL — AB (ref 6–20)
CALCIUM: 8.8 mg/dL — AB (ref 8.9–10.3)
CO2: 34 mmol/L — ABNORMAL HIGH (ref 22–32)
Chloride: 86 mmol/L — ABNORMAL LOW (ref 101–111)
Creatinine, Ser: 1.75 mg/dL — ABNORMAL HIGH (ref 0.61–1.24)
GFR calc Af Amer: 46 mL/min — ABNORMAL LOW (ref >60)
GFR, EST NON AFRICAN AMERICAN: 40 mL/min — AB (ref >60)
GLUCOSE: 127 mg/dL — AB (ref 65–99)
POTASSIUM: 3.3 mmol/L — AB (ref 3.5–5.1)
Sodium: 131 mmol/L — ABNORMAL LOW (ref 135–145)

## 2015-07-29 LAB — CBC
HEMATOCRIT: 34.8 % — AB (ref 40.0–52.0)
HEMOGLOBIN: 11.8 g/dL — AB (ref 13.0–18.0)
MCH: 32.3 pg (ref 26.0–34.0)
MCHC: 33.8 g/dL (ref 32.0–36.0)
MCV: 95.5 fL (ref 80.0–100.0)
Platelets: 214 10*3/uL (ref 150–440)
RBC: 3.64 MIL/uL — ABNORMAL LOW (ref 4.40–5.90)
RDW: 15 % — ABNORMAL HIGH (ref 11.5–14.5)
WBC: 6.6 10*3/uL (ref 3.8–10.6)

## 2015-07-29 LAB — GLUCOSE, CAPILLARY
GLUCOSE-CAPILLARY: 102 mg/dL — AB (ref 65–99)
GLUCOSE-CAPILLARY: 151 mg/dL — AB (ref 65–99)
Glucose-Capillary: 114 mg/dL — ABNORMAL HIGH (ref 65–99)
Glucose-Capillary: 224 mg/dL — ABNORMAL HIGH (ref 65–99)
Glucose-Capillary: 178 mg/dL — ABNORMAL HIGH (ref 65–99)

## 2015-07-29 LAB — APTT: aPTT: 36 seconds (ref 24–36)

## 2015-07-29 LAB — PROTIME-INR
INR: 1.44
Prothrombin Time: 17.6 seconds — ABNORMAL HIGH (ref 11.4–15.0)

## 2015-07-29 LAB — MAGNESIUM: Magnesium: 2 mg/dL (ref 1.7–2.4)

## 2015-07-29 MED ORDER — SODIUM CHLORIDE 0.9 % IV SOLN
Freq: Once | INTRAVENOUS | Status: AC
  Administered 2015-07-29: 11:00:00 via INTRAVENOUS

## 2015-07-29 MED ORDER — FENTANYL CITRATE (PF) 100 MCG/2ML IJ SOLN
INTRAMUSCULAR | Status: AC
  Filled 2015-07-29: qty 2

## 2015-07-29 MED ORDER — OXYCODONE-ACETAMINOPHEN 5-325 MG PO TABS
1.0000 | ORAL_TABLET | ORAL | Status: DC | PRN
  Administered 2015-07-29 – 2015-07-31 (×5): 1 via ORAL
  Filled 2015-07-29 (×5): qty 1

## 2015-07-29 MED ORDER — APIXABAN 5 MG PO TABS
5.0000 mg | ORAL_TABLET | Freq: Two times a day (BID) | ORAL | Status: DC
  Administered 2015-07-29 – 2015-08-01 (×7): 5 mg via ORAL
  Filled 2015-07-29 (×7): qty 1

## 2015-07-29 MED ORDER — MIDAZOLAM HCL 5 MG/5ML IJ SOLN
INTRAMUSCULAR | Status: AC
  Filled 2015-07-29: qty 10

## 2015-07-29 MED ORDER — FENTANYL CITRATE (PF) 100 MCG/2ML IJ SOLN
INTRAMUSCULAR | Status: AC | PRN
  Administered 2015-07-29: 25 ug via INTRAVENOUS
  Administered 2015-07-29: 50 ug via INTRAVENOUS

## 2015-07-29 MED ORDER — IOHEXOL 300 MG/ML  SOLN
30.0000 mL | Freq: Once | INTRAMUSCULAR | Status: AC | PRN
  Administered 2015-07-29: 10 mL

## 2015-07-29 MED ORDER — LIDOCAINE HCL (PF) 1 % IJ SOLN
INTRAMUSCULAR | Status: AC
  Filled 2015-07-29: qty 30

## 2015-07-29 MED ORDER — OXYCODONE HCL 5 MG PO TABS
5.0000 mg | ORAL_TABLET | ORAL | Status: DC | PRN
  Administered 2015-07-29: 5 mg via ORAL
  Filled 2015-07-29: qty 1

## 2015-07-29 MED ORDER — FENTANYL CITRATE (PF) 100 MCG/2ML IJ SOLN
50.0000 ug | Freq: Once | INTRAMUSCULAR | Status: AC
  Administered 2015-07-29: 50 ug via INTRAVENOUS

## 2015-07-29 MED ORDER — CIPROFLOXACIN IN D5W 400 MG/200ML IV SOLN
400.0000 mg | INTRAVENOUS | Status: AC
  Administered 2015-07-29: 400 mg via INTRAVENOUS
  Filled 2015-07-29: qty 200

## 2015-07-29 MED ORDER — FENTANYL CITRATE (PF) 100 MCG/2ML IJ SOLN
INTRAMUSCULAR | Status: AC
  Filled 2015-07-29: qty 4

## 2015-07-29 MED ORDER — MIDAZOLAM HCL 5 MG/5ML IJ SOLN
INTRAMUSCULAR | Status: AC | PRN
  Administered 2015-07-29: 2 mg via INTRAVENOUS
  Administered 2015-07-29: 1 mg via INTRAVENOUS

## 2015-07-29 MED ORDER — MORPHINE SULFATE (PF) 2 MG/ML IV SOLN: 2.0000 mg | INTRAVENOUS | Status: DC | PRN

## 2015-07-29 NOTE — Procedures (Signed)
Interventional Radiology Procedure Note  Procedure: Placement of a left PCN for obstructive ureteral stone.    Findings:  33F tube into the collecting system.  014 wire would not advance beyond the proximal ureteral stone.    Complications: None Recommendations:  - To gravity drain.  Observe for urine clearing over the next few days. Record output each shift. - Do not submerge site - Routine care   Signed,  Dulcy Fanny. Earleen Newport, DO

## 2015-07-29 NOTE — Progress Notes (Signed)
Patient ID: [name], male   DOB: [birth year], 65 y.o.   MRN: [id number]  [facility] PROGRESS NOTE  Nouman Malagon [id number] DOB: 1951/02/28 DOA: 07/08/2015 PCP: No PCP Per Patient  HPI/Subjective: een after nephrostomy tube placement complains of pain as expected Objective: Filed Vitals:   07/29/15 1100 07/29/15 1402  BP: 109/73 104/61  Pulse: 79 75  Temp: 97.6 F (36.4 C) 97.4 F (36.3 C)  Resp: 20 20    Filed Weights   07/27/15 0600 07/28/15 0452 07/29/15 0621  Weight: 140.524 kg (309 lb 12.8 oz) 138.846 kg (306 lb 1.6 oz) 137.984 kg (304 lb 3.2 oz)    ROS: Review of Systems  Constitutional: Negative for fever and chills.  Eyes: Negative for blurred vision.  Respiratory: Positive for shortness of breath. Negative for cough.   Cardiovascular: Negative for chest pain.  Gastrointestinal: Negative for nausea, vomiting, abdominal pain, diarrhea and constipation.  Genitourinary: Positive for flank pain. Negative for dysuria.  Musculoskeletal: Negative for joint pain.  Neurological: Negative for dizziness and headaches.   Exam: Physical Exam  Constitutional: [name] oriented to person, place, and time.  HENT:  Nose: No mucosal edema.  Mouth/Throat: No oropharyngeal exudate or posterior oropharyngeal edema.  Eyes: Conjunctivae, EOM and lids are normal. Pupils are equal, round, and reactive to light.  Neck: No JVD present. Carotid bruit is not present. No edema present. No thyroid mass and no thyromegaly present.  Cardiovascular: S1 normal and S2 normal.  Exam reveals no gallop.   No murmur heard. Pulses:      Dorsalis pedis pulses are 2+ on the right side, and 2+ on the left side.  Respiratory: No respiratory distress. He has no wheezes. He has no rhonchi. He has no rales.  GI: Soft. Bowel sounds are normal. There is no tenderness.  Musculoskeletal:       Right ankle: He exhibits swelling.       Left ankle: He exhibits swelling.  Lymphadenopathy:    He has  no cervical adenopathy.  Neurological: [name] alert and oriented to person, place, and time. No cranial nerve deficit.  Skin: Skin is warm. Nails show no clubbing.  Scaling bilateral lower extremity feet. Toes bilaterally erythematous and swollen. Chronic lower extremity discoloration bilateral lower extremities. Looks like a plaque psoriasis on her arms.  Psychiatric: He has a normal mood and affect.      Data Reviewed: Basic Metabolic Panel:  Recent Labs Lab 07/23/15 0500 07/24/15 0627 07/27/15 0500 07/29/15 0616  NA 135 135 130* 131*  K 4.2 4.2 3.5 3.3*  CL 97* 97* 91* 86*  CO2 30 29 30  34*  GLUCOSE 142* 104* 74 127*  BUN 52* 54* 64* 67*  CREATININE 1.60* 1.67* 1.77* 1.75*  CALCIUM 8.6* 8.7* 8.5* 8.8*  MG  --   --   --  2.0   CBC:  Recent Labs Lab 07/23/15 0500 07/26/15 0445 07/29/15 0618  WBC  --  6.0 6.6  HGB  --  12.1* 11.8*  HCT  --  36.5* 34.8*  MCV  --  96.6 95.5  PLT 203 230 214    CBG:  Recent Labs Lab 07/28/15 1137 07/28/15 1600 07/28/15 2125 07/29/15 0717 07/29/15 1100  GLUCAP 228* 133* 140* 114* 102*    Recent Results (from the past 240 hour(s))  Urine culture     Status: None   Collection Time: 07/26/15 11:19 AM  Result Value Ref Range Status   Specimen Description URINE, RANDOM  Final   Special Requests NONE  Final   Culture NO GROWTH 1 DAY  Final   Report Status 07/27/2015 FINAL  Final     Scheduled Meds: . amiodarone  200 mg Oral Daily  . atorvastatin  40 mg Oral q1800  . carvedilol  3.125 mg Oral BID WC  . docusate sodium  100 mg Oral BID  . fentaNYL      . fentaNYL      . furosemide  80 mg Oral BID  . hydrocortisone   Rectal TID  . hydrocortisone  25 mg Rectal Once  . insulin aspart  0-5 Units Subcutaneous QHS  . insulin aspart  0-9 Units Subcutaneous TID WC  . insulin aspart  7 Units Subcutaneous TID WC  . insulin glargine  5 Units Subcutaneous QHS  . loratadine  10 mg Oral QHS  . magnesium citrate  1 Bottle Oral Once   . metolazone  2.5 mg Oral Daily  . midazolam      . polycarbophil  625 mg Oral Daily  . potassium chloride  20 mEq Oral Daily  . sodium chloride flush  10-40 mL Intracatheter Q12H  . sodium chloride flush  3 mL Intravenous Q12H  . spironolactone  25 mg Oral QHS    Assessment/Plan:  1.  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 Switch diuresis with Lasix 80 mg po twice a day. Patient also on middle low zone and spironolactone. Patient is on low-dose Coreg. Not a candidate for interested with blood pressure being on the lower side. Hold ACE inhibitor given kidney function 2. Atrial fibrillation continue amiodarone for rate control. Eliquis now on hold for urology procedure. Patient assistance form for eliquis filled out. Restart Eliquis 3. Hyperlipidemia unspecified continue atorvastatin 4. Bilateral lower extremity cellulitis- finished antibiotics 5. Type 2 diabetes without complication continue low-dose Lantus and sliding scale 6. Anxiety on Xanax as needed 7. Chronic left nephrolithiasis with chronic hydronephrosis. pOst-nephrostomy tube 8. Lac-Hydrin lotion for the feet.  Code Status:     Code Status Orders        Start     Ordered   07/15/15 2355  Full code   Continuous     07/15/15 2354    Code Status History    Date Active Date Inactive Code Status Order ID Comments User Context   07/08/2015 10:28 PM 07/15/2015 11:54 PM Full Code [id number]  Demetrios Loll, MD Inpatient     Disposition Plan: To be determined  Consultants:  Cardiology  Urology  Time spent: 28 minutes  Hower,  Lovett Benda  [facility]

## 2015-07-29 NOTE — OR Nursing (Signed)
Upon arrival from proc. Room, sats without O2 down to 87%, placed in O2 2 liters with sats returning to 96%, etco2 readings: 48

## 2015-07-29 NOTE — Progress Notes (Signed)
Hospital Problem List     Principal Problem:   Acute on chronic combined systolic and diastolic CHF (congestive heart failure) (HCC) Active Problems:   Elevated troponin   Pulmonary hypertension (HCC)   Cardiorenal syndrome   Acute on chronic renal failure (HCC)   Morbid obesity due to excess calories (HCC)   Paroxysmal atrial fibrillation (HCC)   Orthostatic hypotension   Hyponatremia   Uncontrolled type 2 diabetes mellitus (HCC)   OSA (obstructive sleep apnea)   Kidney stone on left side   Hydronephrosis, left    Patient Profile:   65 y.o. male w/ PMH of CAD (s/p remote PCI and stenting 25 years ago), ischemic cardiomyopathy (EF 15-20% this admission), obesity, OSA, HTN, and DM admitted to [facility] on 07/08/2015 with acute on chronic combined systolic and diastolic CHF.  Subjective   He feels better overall. He has nephrolithiasis and  underwent nephrostomy tube placement for hydronephrosis. There is bloody drainage.     Inpatient Medications    . amiodarone  200 mg Oral Daily  . apixaban  5 mg Oral BID  . atorvastatin  40 mg Oral q1800  . carvedilol  3.125 mg Oral BID WC  . docusate sodium  100 mg Oral BID  . fentaNYL      . fentaNYL      . furosemide  80 mg Oral BID  . hydrocortisone   Rectal TID  . hydrocortisone  25 mg Rectal Once  . insulin aspart  0-5 Units Subcutaneous QHS  . insulin aspart  0-9 Units Subcutaneous TID WC  . insulin aspart  7 Units Subcutaneous TID WC  . insulin glargine  5 Units Subcutaneous QHS  . loratadine  10 mg Oral QHS  . magnesium citrate  1 Bottle Oral Once  . metolazone  2.5 mg Oral Daily  . midazolam      . polycarbophil  625 mg Oral Daily  . potassium chloride  20 mEq Oral Daily  . sodium chloride flush  10-40 mL Intracatheter Q12H  . sodium chloride flush  3 mL Intravenous Q12H  . spironolactone  25 mg Oral QHS    Vital Signs    Filed Vitals:   07/29/15 0621 07/29/15 0842 07/29/15 1100 07/29/15 1402  BP: 110/72 111/71  109/73 104/61  Pulse: 80 77 79 75  Temp: 97.7 F (36.5 C) 97.4 F (36.3 C) 97.6 F (36.4 C) 97.4 F (36.3 C)  TempSrc: Oral Oral Oral Oral  Resp: 20 18 20 20   Height:      Weight: 304 lb 3.2 oz (137.984 kg)     SpO2: 100% 100% 98% 98%    Intake/Output Summary (Last 24 hours) at 07/29/15 1635 Last data filed at 07/29/15 1600  Gross per 24 hour  Intake    250 ml  Output   2975 ml  Net  -2725 ml   Filed Weights   07/27/15 0600 07/28/15 0452 07/29/15 0621  Weight: 309 lb 12.8 oz (140.524 kg) 306 lb 1.6 oz (138.846 kg) 304 lb 3.2 oz (137.984 kg)    Physical Exam    General: Morbidly obese Caucasian male appearing in no acute distress. Head: Normocephalic, atraumatic.  Neck: Supple without bruits, JVD unable to be assessed secondary to body habitus. Lungs:  Resp regular and unlabored, CTA without wheezing or rales. Improved aeration at the bases. Heart: RRR, S1, S2, no S3, S4, or murmur; no rub. Abdomen: Soft, non-tender, non-distended with normoactive bowel sounds. No hepatomegaly. No rebound/guarding. No  obvious abdominal masses. Extremities: No clubbing or cyanosis, 1+ edema bilaterally. Distal pedal pulses are 2+ bilaterally. Mild ecchymosis along right wrist. No evidence of a hematoma. Neuro: Alert and oriented X 3. Moves all extremities spontaneously. Psych: Normal affect.  Labs    CBC  Recent Labs  07/29/15 0618  WBC 6.6  HGB 11.8*  HCT 34.8*  MCV 95.5  PLT [id number]   Basic Metabolic Panel  Recent Labs  07/27/15 0500 07/29/15 0616  NA 130* 131*  K 3.5 3.3*  CL 91* 86*  CO2 30 34*  GLUCOSE 74 127*  BUN 64* 67*  CREATININE 1.77* 1.75*  CALCIUM 8.5* 8.8*  MG  --  2.0     Telemetry    NSR, HR in 80's - 90's. Occasional PVC's.  ECG    No new tracings.   Cardiac Studies and Radiology    US Renal: 07/24/2015  CLINICAL DATA:  Left-sided kidney stone.  Difficulty urinating. EXAM: RENAL / URINARY TRACT ULTRASOUND COMPLETE COMPARISON:  Radiographs today.  FINDINGS: Right Kidney: Length: 11.9 cm. Echogenicity within normal limits. No mass or hydronephrosis visualized. Left Kidney: Length: 14.1 cm. The left kidney is suboptimally visualized, partly obscured by bowel gas. There is a probable cyst in the interpolar region measuring approximately 4.1 x 3.6 x 4.1 cm. The left renal pelvis appears mildly distended. The calcifications demonstrated on the radiographs are not well seen, probably within the ureter. Bladder: Appears normal for degree of bladder distention. Ureteral jets were not visualized. Pelvic ascites noted. IMPRESSION: 1. Suboptimal visualization of the left kidney due to bowel gas. The left renal pelvis appears dilated, suggesting obstruction of the left ureter by the calcifications demonstrated on radiographs. 2. Left renal cyst. 3. Pelvic ascites. 4. CT suggested for further evaluation. Electronically Signed   By: Richardean Sale M.D.   On: 07/24/2015 19:32   Dg Abd 2 Views: 07/24/2015  CLINICAL DATA:  Left kidney stone.  Difficulty urinating. EXAM: ABDOMEN - 2 VIEW COMPARISON:  None. FINDINGS: Normal bowel gas pattern without free peritoneal air. Adjacent 2.8 x 1.2 cm and 1.4 x 0.9 cm oval calcifications in the expected position of the proximal to mid left ureter. Mild lumbar spine degenerative changes. IMPRESSION: 2.8 cm and 1.4 cm probable left ureteral calculi. Calcified lymph nodes are less likely. Electronically Signed   By: Claudie Revering M.D.   On: 07/24/2015 12:53   Ct Renal Stone Study: 07/25/2015  CLINICAL DATA:  Left flank pain.  History kidney stones. EXAM: CT ABDOMEN AND PELVIS WITHOUT CONTRAST TECHNIQUE: Multidetector CT imaging of the abdomen and pelvis was performed following the standard protocol without IV contrast. COMPARISON:  Renal ultrasound and abdominal radiographs 1 day prior. FINDINGS: Lower chest: Heart mildly enlarged. Coronary artery calcifications are seen. There is a small left pleural effusion. Liver: Enlarged with  diffusely decreased density consistent with steatosis. Hepatobiliary: Gallbladder physiologically distended, no calcified stone. No biliary dilatation. Pancreas: No ductal dilatation or inflammation. Spleen: Normal in size measuring 11.6 cm greatest dimension. Adrenal glands: No nodule. Kidneys: There are 2 large stones in the mid proximal left ureter. More proximal stone measures 1.3 x 1.1 x 2.2 cm, with an adjacent stone measuring 2.5 x 1.6 x 1.4 cm. Moderate proximal hydroureteronephrosis with increased density of the urine in the renal collecting system, suggestive of blood. Ureter distal to this is decompressed, however is an additional 0.9 x 1.0 x 0.6 cm stone in the left distal ureter just proximal to the ureterovesicular junction. There is  thinning of the left renal parenchyma. Small nonobstructing stone in the lower left kidney. Cyst in the interpolar left kidney measures 3.9 cm. No stones or obstructive uropathy of the right kidney. Right ureter is decompressed. Stomach/Bowel: Limited bowel assessment secondary to lack of enteric contrast, intra-abdominal ascites, and soft tissue attenuation from body habitus. Stomach physiologically distended with ingested contents. There are no dilated or thickened small bowel loops. Small volume of stool throughout the colon without colonic wall thickening. The appendix is not confidently identified. Vascular/Lymphatic: No retroperitoneal adenopathy. Abdominal aorta is normal in caliber moderate aortic atherosclerosis without aneurysm. Reproductive: Prostatic calcifications. Bladder: Decompressed, probable intraluminal blood. Other: Moderate volume of intra-abdominal and pelvic ascites. Free fluid in the mesenteric, pelvis, both pericolic gutters in both upper quadrants. Mild mesenteric and whole body wall edema. No free air. No loculated intra-abdominal fluid collection. Musculoskeletal: There are no acute or suspicious osseous abnormalities. Facet arthropathy in the  lower lumbar spine. IMPRESSION: 1. Left hydroureteronephrosis with 2 large stones in the mid proximal ureter measuring 2.2 and 2.5 cm. Ureter distal to this is decompressed, however there is an additional 1 cm stone in the distal left ureter, just proximal to the ureterovesicular junction. Increased density of the urine in the bladder and left renal collecting system consistent with hemorrhage. 2. Moderate volume intra-abdominal ascites. There is hepatic steatosis. 3. Atherosclerosis, including coronary artery calcifications. Cardiomegaly and small left effusion in the included lung bases. Electronically Signed   By: Jeb Levering M.D.   On: 07/25/2015 19:45    Cardiac Catheterization: 07/25/2015   Dist RCA lesion, 60% stenosed.  Prox LAD to Mid LAD lesion, 60% stenosed. The lesion was previously treated with a stent (unknown type) greater than two years ago.  Ost 2nd Diag to 2nd Diag lesion, 70% stenosed.  1. Moderately elevated filling pressures with moderate pulmonary hypertension. Mildly reduced cardiac output. Hemodynamic features are suggestive of restrictive physiology. 2. Moderate two-vessel coronary artery disease. Patent proximal LAD stent with 60% in-stent restenosis. The LAD and RCA are moderately calcified. 3. Severely reduced LV systolic function by echocardiogram. Left ventricular angiography was not performed due to chronic kidney disease.  PA pressure: 56/34 with a mean of 42, LV pressure: 95/15 with left ventricular end-diastolic pressure of 26. Cardiac output was 4.86 with a cardiac index of 1.9.  Recommendations: The patient is still volume overloaded and requires further diuresis. I'm going to increase the dose of furosemide and if needed metolazone can be added. I will resume small dose carvedilol. If diuresis continues to be difficult, renal dose milrinone can be added instead of dobutamine given that the later caused significant tachycardia.  Assessment & Plan    1.  Acute on chronic combined systolic and diastolic heart failure - EF of 15-20% with moderate mitral regurgitation and moderate pulmonary hypertension and severely dilated inferior vena cava.  - Slowly improving, taken off Dobutamine 07/21/2015 .  -  His volume status seems to be stable. Continue furosemide to 80 mg by mouth twice daily Along with Metolazone, BB, and Spironolactone.  No ACE inhibitor for now due to low blood pressure .  2. Newly diagnosed atrial fibrillation with rapid ventricular response - converted to NSR on 07/11/2015 - This patients CHA2DS2-VASc Score and unadjusted Ischemic Stroke Rate (% per year) is equal to 4.8 % stroke rate/year from a score of 4 (CHF, HTN, DM, Vascular). Eliquis held for potential Urology procedure.  -Continue amiodarone to 200 mg once daily. - Telemetry shows no evidence of  recurrent atrial fibrillation.  3. Coronary artery disease with previous PCI/ Elevated Troponin - Cath on 07/25/2015 showed moderate 2-vessel CAD with patent proximal LAD stent with 60% in-stent restenosis. - continue statin and BB.  4. Morbid obesity - BMI of 42.8 on admission. - Likely has sleep apnea as well, needs CPAP  5. Acute on chronic Stage 3 CKD/ Obstructing Left Ureteral Stones. Renal function is stable.  6. Uncontrolled Type 2 DM - A1c 11.0 this admission - Drinks significant volume of sweet tea and reports he wants his first meal at the time of discharge to be fried chicken - education has been provided by the Diabetes Coordinator as well as nursing staff and his providers.  7. OSA - reports his CPAP machine at home is broke. Unable to tolerate CPAP thus far this admission due to feeling like he is being smothered. - will likely need O2 going home for nocturnal use until he can have a repeat sleep study.  The patient is stable from a cardiac standpoint.  Signed, Kathlyn Sacramento , MD 4:35 PM 07/29/2015

## 2015-07-29 NOTE — Care Management (Signed)
Left nephrostomy tube is draining  urine with brighter red blood.  Eliquis has been on hold prior to this procedure.  Provided patient with coupon for 30 day coupon and in anticipation of insurance coverage 3/1 with drug coverage- provided patient with the monthly copay card.  Patient says 'the other care manager has the other form.".  is referring to the [place] form.  This CM obtained MD signature and faxed the  physician portion of the form to [facility].  Patient per nursing has not qualified for home 02.  Discussed that [facility] will provide nursing services .

## 2015-07-29 NOTE — Progress Notes (Signed)
07/29/2015  Patient seen and examined today. Discussed percutaneous nephrostomy tube with interventional radiology this morning, Dr. Earleen Newport. He was made aware of the plans for percutaneous nephrostomy tube, ideally nephroureteral tube to help facilitate retrograde treatment of his significant left ureteral stone burden. Unfortunately, he is only able to place a nephrostomy tube today and we will plan for antegrade wire placement on the day of planned left ureteroscopy to be performed as an outpatient.  Discussed plan with the patient today at bedside.  NAD a and O 3 Abdomen soft nontender nondistended Left nephrostomy tube in place draining cranberry-colored urine, no clots. Scant blood on nephrostomy tube dressing, dressing changed at bedside. Voiding clear yellow urine  Plan: Patient may resume anticoagulation which is our to been restarted today Discharged home with nephrostomy tube in place We will arrange for outpatient follow-up within 1-2 weeks of discharge to arrange for outpatient procedure Patient will need cardiac clearance prior to elective outpatient surgery in order to hold anticoagulation for this procedure  Urology will sign off during this admission.  Please call if needed.    Hollice Espy, MD

## 2015-07-29 NOTE — Progress Notes (Signed)
PT Cancellation Note  Patient Details Name: [name] MRN: [id number] DOB: 07/19/1950   Cancelled Treatment:    Reason Eval/Treat Not Completed: Other (comment). Pt recently returned from nephroureteral cath. Pt now eating lunch and he was NPO prior to procedure. Pt refuses physical therapy at this time. Pt educated on continued mobility attempts with staff. Will re-attempt next available date.   Dawnn Nam 07/29/2015, 2:51 PM Greggory Stallion, PT, DPT [phone]

## 2015-07-29 NOTE — Progress Notes (Signed)
Pt complaining that pain medication is not effective, stating that "percocet works better for me". MD Sudini notified. MD to place orders. Will continue to monitor.   [name]

## 2015-07-30 LAB — GLUCOSE, CAPILLARY
Glucose-Capillary: 115 mg/dL — ABNORMAL HIGH (ref 65–99)
Glucose-Capillary: 238 mg/dL — ABNORMAL HIGH (ref 65–99)
Glucose-Capillary: 122 mg/dL — ABNORMAL HIGH (ref 65–99)
Glucose-Capillary: 98 mg/dL (ref 65–99)

## 2015-07-30 NOTE — Progress Notes (Signed)
Patient ID: [name], male   DOB: 08/03/1950, 65 y.o.   MRN: [id number]    Hospital Problem List     Principal Problem:   Acute on chronic combined systolic and diastolic CHF (congestive heart failure) (HCC) Active Problems:   Elevated troponin   Pulmonary hypertension (HCC)   Cardiorenal syndrome   Acute on chronic renal failure (HCC)   Morbid obesity due to excess calories (HCC)   Paroxysmal atrial fibrillation (HCC)   Orthostatic hypotension   Hyponatremia   Uncontrolled type 2 diabetes mellitus (HCC)   OSA (obstructive sleep apnea)   Kidney stone on left side   Hydronephrosis, left    Patient Profile:   65 y.o. male w/ PMH of CAD (s/p remote PCI and stenting 25 years ago), ischemic cardiomyopathy (EF 15-20% this admission), obesity, OSA, HTN, and DM admitted to [facility] on 07/08/2015 with acute on chronic combined systolic and diastolic CHF.  Subjective   No dyspnea edema better Not clear of plan for nephrostomy tube     Inpatient Medications    . amiodarone  200 mg Oral Daily  . apixaban  5 mg Oral BID  . atorvastatin  40 mg Oral q1800  . carvedilol  3.125 mg Oral BID WC  . docusate sodium  100 mg Oral BID  . furosemide  80 mg Oral BID  . hydrocortisone   Rectal TID  . hydrocortisone  25 mg Rectal Once  . insulin aspart  0-5 Units Subcutaneous QHS  . insulin aspart  0-9 Units Subcutaneous TID WC  . insulin aspart  7 Units Subcutaneous TID WC  . insulin glargine  5 Units Subcutaneous QHS  . loratadine  10 mg Oral QHS  . magnesium citrate  1 Bottle Oral Once  . metolazone  2.5 mg Oral Daily  . polycarbophil  625 mg Oral Daily  . potassium chloride  20 mEq Oral Daily  . sodium chloride flush  10-40 mL Intracatheter Q12H  . sodium chloride flush  3 mL Intravenous Q12H  . spironolactone  25 mg Oral QHS    Vital Signs    Filed Vitals:   07/29/15 1659 07/29/15 1943 07/30/15 0355 07/30/15 0746  BP: 120/80 103/72 115/77 109/72  Pulse: 81 85 79   Temp: 97.8 F  (36.6 C) 97.4 F (36.3 C) 97.9 F (36.6 C)   TempSrc: Oral Oral Oral   Resp: 18 20 18    Height:      Weight:   137.122 kg (302 lb 4.8 oz)   SpO2: 100% 99% 100%     Intake/Output Summary (Last 24 hours) at 07/30/15 0850 Last data filed at 07/30/15 0749  Gross per 24 hour  Intake    120 ml  Output   3525 ml  Net  -3405 ml   Filed Weights   07/28/15 0452 07/29/15 0621 07/30/15 0355  Weight: 138.846 kg (306 lb 1.6 oz) 137.984 kg (304 lb 3.2 oz) 137.122 kg (302 lb 4.8 oz)    Physical Exam    General: Morbidly obese Caucasian male appearing in no acute distress. Head: Normocephalic, atraumatic.  Neck: Supple without bruits, JVD unable to be assessed secondary to body habitus. Lungs:  Resp regular and unlabored, CTA without wheezing or rales. Improved aeration at the bases. Heart: RRR, S1, S2, no S3, S4, or murmur; no rub. Abdomen: Soft, non-tender, non-distended with normoactive bowel sounds. No hepatomegaly. No rebound/guarding. No obvious abdominal masses. Extremities: No clubbing or cyanosis, 1+ edema bilaterally. Distal pedal pulses are 2+  bilaterally. Mild ecchymosis along right wrist. No evidence of a hematoma. Neuro: Alert and oriented X 3. Moves all extremities spontaneously. Psych: Normal affect. Left sided nephrostomy tube in place   Labs    CBC  Recent Labs  07/29/15 0618  WBC 6.6  HGB 11.8*  HCT 34.8*  MCV 95.5  PLT [id number]   Basic Metabolic Panel  Recent Labs  07/29/15 0616  NA 131*  K 3.3*  CL 86*  CO2 34*  GLUCOSE 127*  BUN 67*  CREATININE 1.75*  CALCIUM 8.8*  MG 2.0     Telemetry    NSR, HR in 80's - 90's. Occasional PVC's.  ECG    No new tracings.   Cardiac Studies and Radiology    US Renal: 07/24/2015  CLINICAL DATA:  Left-sided kidney stone.  Difficulty urinating. EXAM: RENAL / URINARY TRACT ULTRASOUND COMPLETE COMPARISON:  Radiographs today. FINDINGS: Right Kidney: Length: 11.9 cm. Echogenicity within normal limits. No mass or  hydronephrosis visualized. Left Kidney: Length: 14.1 cm. The left kidney is suboptimally visualized, partly obscured by bowel gas. There is a probable cyst in the interpolar region measuring approximately 4.1 x 3.6 x 4.1 cm. The left renal pelvis appears mildly distended. The calcifications demonstrated on the radiographs are not well seen, probably within the ureter. Bladder: Appears normal for degree of bladder distention. Ureteral jets were not visualized. Pelvic ascites noted. IMPRESSION: 1. Suboptimal visualization of the left kidney due to bowel gas. The left renal pelvis appears dilated, suggesting obstruction of the left ureter by the calcifications demonstrated on radiographs. 2. Left renal cyst. 3. Pelvic ascites. 4. CT suggested for further evaluation. Electronically Signed   By: Richardean Sale M.D.   On: 07/24/2015 19:32   Dg Abd 2 Views: 07/24/2015  CLINICAL DATA:  Left kidney stone.  Difficulty urinating. EXAM: ABDOMEN - 2 VIEW COMPARISON:  None. FINDINGS: Normal bowel gas pattern without free peritoneal air. Adjacent 2.8 x 1.2 cm and 1.4 x 0.9 cm oval calcifications in the expected position of the proximal to mid left ureter. Mild lumbar spine degenerative changes. IMPRESSION: 2.8 cm and 1.4 cm probable left ureteral calculi. Calcified lymph nodes are less likely. Electronically Signed   By: Claudie Revering M.D.   On: 07/24/2015 12:53   Ct Renal Stone Study: 07/25/2015  CLINICAL DATA:  Left flank pain.  History kidney stones. EXAM: CT ABDOMEN AND PELVIS WITHOUT CONTRAST TECHNIQUE: Multidetector CT imaging of the abdomen and pelvis was performed following the standard protocol without IV contrast. COMPARISON:  Renal ultrasound and abdominal radiographs 1 day prior. FINDINGS: Lower chest: Heart mildly enlarged. Coronary artery calcifications are seen. There is a small left pleural effusion. Liver: Enlarged with diffusely decreased density consistent with steatosis. Hepatobiliary: Gallbladder  physiologically distended, no calcified stone. No biliary dilatation. Pancreas: No ductal dilatation or inflammation. Spleen: Normal in size measuring 11.6 cm greatest dimension. Adrenal glands: No nodule. Kidneys: There are 2 large stones in the mid proximal left ureter. More proximal stone measures 1.3 x 1.1 x 2.2 cm, with an adjacent stone measuring 2.5 x 1.6 x 1.4 cm. Moderate proximal hydroureteronephrosis with increased density of the urine in the renal collecting system, suggestive of blood. Ureter distal to this is decompressed, however is an additional 0.9 x 1.0 x 0.6 cm stone in the left distal ureter just proximal to the ureterovesicular junction. There is thinning of the left renal parenchyma. Small nonobstructing stone in the lower left kidney. Cyst in the interpolar left kidney measures 3.9  cm. No stones or obstructive uropathy of the right kidney. Right ureter is decompressed. Stomach/Bowel: Limited bowel assessment secondary to lack of enteric contrast, intra-abdominal ascites, and soft tissue attenuation from body habitus. Stomach physiologically distended with ingested contents. There are no dilated or thickened small bowel loops. Small volume of stool throughout the colon without colonic wall thickening. The appendix is not confidently identified. Vascular/Lymphatic: No retroperitoneal adenopathy. Abdominal aorta is normal in caliber moderate aortic atherosclerosis without aneurysm. Reproductive: Prostatic calcifications. Bladder: Decompressed, probable intraluminal blood. Other: Moderate volume of intra-abdominal and pelvic ascites. Free fluid in the mesenteric, pelvis, both pericolic gutters in both upper quadrants. Mild mesenteric and whole body wall edema. No free air. No loculated intra-abdominal fluid collection. Musculoskeletal: There are no acute or suspicious osseous abnormalities. Facet arthropathy in the lower lumbar spine. IMPRESSION: 1. Left hydroureteronephrosis with 2 large stones  in the mid proximal ureter measuring 2.2 and 2.5 cm. Ureter distal to this is decompressed, however there is an additional 1 cm stone in the distal left ureter, just proximal to the ureterovesicular junction. Increased density of the urine in the bladder and left renal collecting system consistent with hemorrhage. 2. Moderate volume intra-abdominal ascites. There is hepatic steatosis. 3. Atherosclerosis, including coronary artery calcifications. Cardiomegaly and small left effusion in the included lung bases. Electronically Signed   By: Jeb Levering M.D.   On: 07/25/2015 19:45    Cardiac Catheterization: 07/25/2015   Dist RCA lesion, 60% stenosed.  Prox LAD to Mid LAD lesion, 60% stenosed. The lesion was previously treated with a stent (unknown type) greater than two years ago.  Ost 2nd Diag to 2nd Diag lesion, 70% stenosed.  1. Moderately elevated filling pressures with moderate pulmonary hypertension. Mildly reduced cardiac output. Hemodynamic features are suggestive of restrictive physiology. 2. Moderate two-vessel coronary artery disease. Patent proximal LAD stent with 60% in-stent restenosis. The LAD and RCA are moderately calcified. 3. Severely reduced LV systolic function by echocardiogram. Left ventricular angiography was not performed due to chronic kidney disease.  PA pressure: 56/34 with a mean of 42, LV pressure: 95/15 with left ventricular end-diastolic pressure of 26. Cardiac output was 4.86 with a cardiac index of 1.9.  Recommendations: The patient is still volume overloaded and requires further diuresis. I'm going to increase the dose of furosemide and if needed metolazone can be added. I will resume small dose carvedilol. If diuresis continues to be difficult, renal dose milrinone can be added instead of dobutamine given that the later caused significant tachycardia.  Assessment & Plan    1. Acute on chronic combined systolic and diastolic heart failure - EF of 15-20%  with moderate mitral regurgitation and moderate pulmonary hypertension and severely dilated inferior vena cava.  - Slowly improving, taken off Dobutamine 07/21/2015 .  -  His volume status seems to be stable. Continue furosemide to 80 mg by mouth twice daily Along with Metolazone, BB, and Spironolactone.  Will arrange outpatient f/u with Dr Fletcher Anon.    2. Newly diagnosed atrial fibrillation with rapid ventricular response - converted to NSR on 07/11/2015 - This patients CHA2DS2-VASc Score and unadjusted Ischemic Stroke Rate (% per year) is equal to 4.8 % stroke rate/year from a score of 4 (CHF, HTN, DM, Vascular). -Continue amiodarone to 200 mg once daily. - Telemetry shows no evidence of recurrent atrial fibrillation. On Eliquis   3. Coronary artery disease with previous PCI/ Elevated Troponin - Cath on 07/25/2015 showed moderate 2-vessel CAD with patent proximal LAD stent with 60%  in-stent restenosis. - continue statin and BB. Stable no angina   4. Morbid obesity - BMI of 42.8 on admission. - Likely has sleep apnea as well, needs CPAP  5. Acute on chronic Stage 3 CKD/ Obstructing Left Ureteral Stones. Renal function is stable.  6. Uncontrolled Type 2 DM - A1c 11.0 this admission - Drinks significant volume of sweet tea and reports he wants his first meal at the time of discharge to be fried chicken - education has been provided by the Diabetes Coordinator as well as nursing staff and his providers.  7. OSA - reports his CPAP machine at home is broke. Unable to tolerate CPAP thus far this admission due to feeling like he is being smothered. - will likely need O2 going home for nocturnal use until he can have a repeat sleep study.  The patient is stable from a cardiac standpoint.  ? D/C when plan for nephrostomy tube and home care for it finalized  Signed, Jenkins Rouge , MD 8:50 AM 07/30/2015

## 2015-07-30 NOTE — Progress Notes (Signed)
Spoke with dr. Lavetta Nielsen to make aware patients bp is 109/72 has scheduled po coreg 3.125 and 80mg  po lasix. Per md okay to give scheduled dose. Will continue to monitor

## 2015-07-30 NOTE — Progress Notes (Signed)
Patient ID: [name], male   DOB: [birth year], 65 y.o.   MRN: [id number]  [facility] PROGRESS NOTE  [name] [id number] DOB: [birth year] DOA: 07/08/2015 PCP: No PCP Per Patient  HPI/Subjective: Still complains of some pain at nephrostomy site however relieved with Percocet Anxious about being discharged Objective: Filed Vitals:   07/30/15 0746 07/30/15 1145  BP: 109/72 107/69  Pulse:  84  Temp:  97.8 F (36.6 C)  Resp:  18    Filed Weights   07/28/15 0452 07/29/15 0621 07/30/15 0355  Weight: 138.846 kg (306 lb 1.6 oz) 137.984 kg (304 lb 3.2 oz) 137.122 kg (302 lb 4.8 oz)    ROS: Review of Systems  Constitutional: Negative for fever and chills.  Eyes: Negative for blurred vision.  Respiratory: Positive for shortness of breath. Negative for cough.   Cardiovascular: Negative for chest pain.  Gastrointestinal: Negative for nausea, vomiting, abdominal pain, diarrhea and constipation.  Genitourinary: Positive for flank pain. Negative for dysuria.  Musculoskeletal: Negative for joint pain.  Neurological: Negative for dizziness and headaches.   Exam: Physical Exam  Constitutional: [name] oriented to person, place, and time.  HENT:  Nose: No mucosal edema.  Mouth/Throat: No oropharyngeal exudate or posterior oropharyngeal edema.  Eyes: Conjunctivae, EOM and lids are normal. Pupils are equal, round, and reactive to light.  Neck: No JVD present. Carotid bruit is not present. No edema present. No thyroid mass and no thyromegaly present.  Cardiovascular: S1 normal and S2 normal.  Exam reveals no gallop.   No murmur heard. Pulses:      Dorsalis pedis pulses are 2+ on the right side, and 2+ on the left side.  Respiratory: No respiratory distress. He has no wheezes. He has no rhonchi. He has no rales.  GI: Soft. Bowel sounds are normal. There is no tenderness.  Musculoskeletal:       Right ankle: He exhibits swelling.       Left ankle: He exhibits swelling.   Lymphadenopathy:    He has no cervical adenopathy.  Neurological: [name] alert and oriented to person, place, and time. No cranial nerve deficit.  Skin: Skin is warm. Nails show no clubbing.  Scaling bilateral lower extremity feet. Toes bilaterally erythematous and swollen. Chronic lower extremity discoloration bilateral lower extremities. Looks like a plaque psoriasis on her arms.  Psychiatric: He has a normal mood and affect.      Data Reviewed: Basic Metabolic Panel:  Recent Labs Lab 07/24/15 0627 07/27/15 0500 07/29/15 0616  NA 135 130* 131*  K 4.2 3.5 3.3*  CL 97* 91* 86*  CO2 29 30 34*  GLUCOSE 104* 74 127*  BUN 54* 64* 67*  CREATININE 1.67* 1.77* 1.75*  CALCIUM 8.7* 8.5* 8.8*  MG  --   --  2.0   CBC:  Recent Labs Lab 07/26/15 0445 07/29/15 0618  WBC 6.0 6.6  HGB 12.1* 11.8*  HCT 36.5* 34.8*  MCV 96.6 95.5  PLT 230 214    CBG:  Recent Labs Lab 07/29/15 1645 07/29/15 1943 07/29/15 2252 07/30/15 0730 07/30/15 1143  GLUCAP 224* 178* 151* 115* 238*    Recent Results (from the past 240 hour(s))  Urine culture     Status: None   Collection Time: 07/26/15 11:19 AM  Result Value Ref Range Status   Specimen Description URINE, RANDOM  Final   Special Requests NONE  Final   Culture NO GROWTH 1 DAY  Final   Report Status 07/27/2015 FINAL  Final  Urine culture     Status: None (Preliminary result)   Collection Time: 07/29/15 12:15 PM  Result Value Ref Range Status   Specimen Description URINE, RANDOM  Final   Special Requests NONE  Final   Culture NO GROWTH < 24 HOURS  Final   Report Status PENDING  Incomplete     Scheduled Meds: . amiodarone  200 mg Oral Daily  . apixaban  5 mg Oral BID  . atorvastatin  40 mg Oral q1800  . carvedilol  3.125 mg Oral BID WC  . docusate sodium  100 mg Oral BID  . furosemide  80 mg Oral BID  . hydrocortisone   Rectal TID  . hydrocortisone  25 mg Rectal Once  . insulin aspart  0-5 Units Subcutaneous QHS  . insulin  aspart  0-9 Units Subcutaneous TID WC  . insulin aspart  7 Units Subcutaneous TID WC  . insulin glargine  5 Units Subcutaneous QHS  . loratadine  10 mg Oral QHS  . magnesium citrate  1 Bottle Oral Once  . metolazone  2.5 mg Oral Daily  . polycarbophil  625 mg Oral Daily  . potassium chloride  20 mEq Oral Daily  . sodium chloride flush  10-40 mL Intracatheter Q12H  . sodium chloride flush  3 mL Intravenous Q12H  . spironolactone  25 mg Oral QHS    Assessment/Plan:  1.  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 Switch diuresis with Lasix 80 mg po twice a day. Patient also on middle low zone and spironolactone. Patient is on low-dose Coreg. Not a candidate for interested with blood pressure being on the lower side. Hold ACE inhibitor given kidney function 2. Atrial fibrillation continue amiodarone for rate control. Eliquis now on hold for urology procedure. Patient assistance form for eliquis filled out. Restart Eliquis 3. Hyperlipidemia unspecified continue atorvastatin 4. Bilateral lower extremity cellulitis- finished antibiotics 5. Type 2 diabetes without complication continue low-dose Lantus and sliding scale 6. Anxiety on Xanax as needed 7. Chronic left nephrolithiasis with chronic hydronephrosis. pOst-nephrostomy tube 8. Lac-Hydrin lotion for the feet.   Disposition: Check CBC tomorrow given Eliquis and recent nephrostomy tube placement, discharged with home health if remains stable  Code Status:     Code Status Orders        Start     Ordered   07/15/15 2355  Full code   Continuous     07/15/15 2354    Code Status History    Date Active Date Inactive Code Status Order ID Comments User Context   07/08/2015 10:28 PM 07/15/2015 11:54 PM Full Code [id number]  Demetrios Loll, MD Inpatient     Disposition Plan: To be determined  Consultants:  Cardiology  Urology  Time spent: 28 minutes  [name],  [name]  [facility]  Hospitalists

## 2015-07-31 LAB — GLUCOSE, CAPILLARY
GLUCOSE-CAPILLARY: 133 mg/dL — AB (ref 65–99)
GLUCOSE-CAPILLARY: 203 mg/dL — AB (ref 65–99)
Glucose-Capillary: 108 mg/dL — ABNORMAL HIGH (ref 65–99)
Glucose-Capillary: 241 mg/dL — ABNORMAL HIGH (ref 65–99)

## 2015-07-31 LAB — URINE CULTURE: CULTURE: NO GROWTH

## 2015-07-31 LAB — CBC
HEMATOCRIT: 34.3 % — AB (ref 40.0–52.0)
Hemoglobin: 11.6 g/dL — ABNORMAL LOW (ref 13.0–18.0)
MCH: 31.7 pg (ref 26.0–34.0)
MCHC: 33.9 g/dL (ref 32.0–36.0)
MCV: 93.4 fL (ref 80.0–100.0)
Platelets: 183 10*3/uL (ref 150–440)
RBC: 3.67 MIL/uL — ABNORMAL LOW (ref 4.40–5.90)
RDW: 15.7 % — AB (ref 11.5–14.5)
WBC: 6.6 10*3/uL (ref 3.8–10.6)

## 2015-07-31 NOTE — Progress Notes (Signed)
Pt doesn't have a CPAP in his room. Pt is refusing to wear CPAP. Pt doesn't feel like he needs CPAP at this time.

## 2015-07-31 NOTE — Progress Notes (Signed)
Patient ID: [name], male   DOB: 27-Apr-1951, 65 y.o.   MRN: [id number]  [facility] PROGRESS NOTE  Moath Lum [id number] DOB: [birth year] DOA: 07/08/2015 PCP: No PCP Per Patient  HPI/Subjective: Still has some pain at nephrostomy site managed with pain meds Nephrostomy with bloody output Objective: Filed Vitals:   07/31/15 0851 07/31/15 1134  BP: 116/65 105/76  Pulse: 86 81  Temp:    Resp:      Filed Weights   07/29/15 0621 07/30/15 0355 07/31/15 0503  Weight: 137.984 kg (304 lb 3.2 oz) 137.122 kg (302 lb 4.8 oz) 135.353 kg (298 lb 6.4 oz)    ROS: Review of Systems  Constitutional: Negative for fever and chills.  Eyes: Negative for blurred vision.  Respiratory: Positive for shortness of breath. Negative for cough.   Cardiovascular: Negative for chest pain.  Gastrointestinal: Negative for nausea, vomiting, abdominal pain, diarrhea and constipation.  Genitourinary: Positive for flank pain. Negative for dysuria.  Musculoskeletal: Negative for joint pain.  Neurological: Negative for dizziness and headaches.   Exam: Physical Exam  Constitutional: [name] oriented to person, place, and time.  HENT:  Nose: No mucosal edema.  Mouth/Throat: No oropharyngeal exudate or posterior oropharyngeal edema.  Eyes: Conjunctivae, EOM and lids are normal. Pupils are equal, round, and reactive to light.  Neck: No JVD present. Carotid bruit is not present. No edema present. No thyroid mass and no thyromegaly present.  Cardiovascular: S1 normal and S2 normal.  Exam reveals no gallop.   No murmur heard. Pulses:      Dorsalis pedis pulses are 2+ on the right side, and 2+ on the left side.  Respiratory: No respiratory distress. He has no wheezes. He has no rhonchi. He has no rales.  GI: Soft. Bowel sounds are normal. There is no tenderness.  Musculoskeletal:       Right ankle: He exhibits swelling.       Left ankle: He exhibits swelling.  Lymphadenopathy:    He has no  cervical adenopathy.  Neurological: [name] alert and oriented to person, place, and time. No cranial nerve deficit.  Skin: Skin is warm. Nails show no clubbing.  Scaling bilateral lower extremity feet. Toes bilaterally erythematous and swollen. Chronic lower extremity discoloration bilateral lower extremities. Looks like a plaque psoriasis on her arms.  Psychiatric: He has a normal mood and affect.      Data Reviewed: Basic Metabolic Panel:  Recent Labs Lab 07/27/15 0500 07/29/15 0616  NA 130* 131*  K 3.5 3.3*  CL 91* 86*  CO2 30 34*  GLUCOSE 74 127*  BUN 64* 67*  CREATININE 1.77* 1.75*  CALCIUM 8.5* 8.8*  MG  --  2.0   CBC:  Recent Labs Lab 07/26/15 0445 07/29/15 0618 07/31/15 0516  WBC 6.0 6.6 6.6  HGB 12.1* 11.8* 11.6*  HCT 36.5* 34.8* 34.3*  MCV 96.6 95.5 93.4  PLT 230 214 183    CBG:  Recent Labs Lab 07/30/15 1143 07/30/15 1638 07/30/15 2101 07/31/15 0739 07/31/15 1132  GLUCAP 238* 122* 98 108* 241*    Recent Results (from the past 240 hour(s))  Urine culture     Status: None   Collection Time: 07/26/15 11:19 AM  Result Value Ref Range Status   Specimen Description URINE, RANDOM  Final   Special Requests NONE  Final   Culture NO GROWTH 1 DAY  Final   Report Status 07/27/2015 FINAL  Final  Urine culture     Status: None  Collection Time: 07/29/15 12:15 PM  Result Value Ref Range Status   Specimen Description URINE, RANDOM  Final   Special Requests NONE  Final   Culture NO GROWTH 2 DAYS  Final   Report Status 07/31/2015 FINAL  Final     Scheduled Meds: . amiodarone  200 mg Oral Daily  . apixaban  5 mg Oral BID  . atorvastatin  40 mg Oral q1800  . carvedilol  3.125 mg Oral BID WC  . docusate sodium  100 mg Oral BID  . furosemide  80 mg Oral BID  . hydrocortisone   Rectal TID  . hydrocortisone  25 mg Rectal Once  . insulin aspart  0-5 Units Subcutaneous QHS  . insulin aspart  0-9 Units Subcutaneous TID WC  . insulin aspart  7 Units  Subcutaneous TID WC  . insulin glargine  5 Units Subcutaneous QHS  . loratadine  10 mg Oral QHS  . magnesium citrate  1 Bottle Oral Once  . metolazone  2.5 mg Oral Daily  . polycarbophil  625 mg Oral Daily  . potassium chloride  20 mEq Oral Daily  . sodium chloride flush  10-40 mL Intracatheter Q12H  . sodium chloride flush  3 mL Intravenous Q12H  . spironolactone  25 mg Oral QHS    Assessment/Plan:  1.  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 Switch diuresis with Lasix 80 mg po twice a day. Patient also on middle low zone and spironolactone. Patient is on low-dose Coreg. Not a candidate for interested with blood pressure being on the lower side. Hold ACE inhibitor given kidney function 2. Atrial fibrillation continue amiodarone for rate control. Patient assistance form for eliquis filled out.  3. Hyperlipidemia unspecified continue atorvastatin 4. Bilateral lower extremity cellulitis- finished antibiotics 5. Type 2 diabetes without complication continue low-dose Lantus and sliding scale 6. Anxiety on Xanax as needed 7. Chronic left nephrolithiasis with chronic hydronephrosis. pOst-nephrostomy tube 8. Lac-Hydrin lotion for the feet.   Disposition: After urology evaluation can likely be discharged with home health tomorrow, continue to wean oxygen  Code Status:     Code Status Orders        Start     Ordered   07/15/15 2355  Full code   Continuous     07/15/15 2354    Code Status History    Date Active Date Inactive Code Status Order ID Comments User Context   07/08/2015 10:28 PM 07/15/2015 11:54 PM Full Code [id number]  Demetrios Loll, MD Inpatient       Consultants:  Cardiology  Urology  Time spent: 28 minutes  Hower,  Aaron Mose  [facility]

## 2015-07-31 NOTE — Progress Notes (Signed)
Patient ID: [name], male   DOB: 1950/12/25, 65 y.o.   MRN: [id number]    Hospital Problem List     Principal Problem:   Acute on chronic combined systolic and diastolic CHF (congestive heart failure) (HCC) Active Problems:   Elevated troponin   Pulmonary hypertension (HCC)   Cardiorenal syndrome   Acute on chronic renal failure (HCC)   Morbid obesity due to excess calories (HCC)   Paroxysmal atrial fibrillation (HCC)   Orthostatic hypotension   Hyponatremia   Uncontrolled type 2 diabetes mellitus (HCC)   OSA (obstructive sleep apnea)   Kidney stone on left side   Hydronephrosis, left    Patient Profile:   65 y.o. male w/ PMH of CAD (s/p remote PCI and stenting 25 years ago), ischemic cardiomyopathy (EF 15-20% this admission), obesity, OSA, HTN, and DM admitted to [facility] on 07/08/2015 with acute on chronic combined systolic and diastolic CHF.  Subjective   Not out of bed much.  No dyspnea Bloody drainage from nephrostomy tube Urology has not been by      Inpatient Medications    . amiodarone  200 mg Oral Daily  . apixaban  5 mg Oral BID  . atorvastatin  40 mg Oral q1800  . carvedilol  3.125 mg Oral BID WC  . docusate sodium  100 mg Oral BID  . furosemide  80 mg Oral BID  . hydrocortisone   Rectal TID  . hydrocortisone  25 mg Rectal Once  . insulin aspart  0-5 Units Subcutaneous QHS  . insulin aspart  0-9 Units Subcutaneous TID WC  . insulin aspart  7 Units Subcutaneous TID WC  . insulin glargine  5 Units Subcutaneous QHS  . loratadine  10 mg Oral QHS  . magnesium citrate  1 Bottle Oral Once  . metolazone  2.5 mg Oral Daily  . polycarbophil  625 mg Oral Daily  . potassium chloride  20 mEq Oral Daily  . sodium chloride flush  10-40 mL Intracatheter Q12H  . sodium chloride flush  3 mL Intravenous Q12H  . spironolactone  25 mg Oral QHS    Vital Signs    Filed Vitals:   07/30/15 2011 07/30/15 2101 07/31/15 0503 07/31/15 0851  BP: 111/83  112/76 116/65  Pulse: 79   83 86  Temp:  97.5 F (36.4 C) 97.4 F (36.3 C)   TempSrc:  Oral Oral   Resp: 20  20   Height:      Weight:   135.353 kg (298 lb 6.4 oz)   SpO2: 100%  98%     Intake/Output Summary (Last 24 hours) at 07/31/15 1041 Last data filed at 07/31/15 [id number]  Gross per 24 hour  Intake    840 ml  Output   5350 ml  Net  -4510 ml   Filed Weights   07/29/15 0621 07/30/15 0355 07/31/15 0503  Weight: 137.984 kg (304 lb 3.2 oz) 137.122 kg (302 lb 4.8 oz) 135.353 kg (298 lb 6.4 oz)    Physical Exam    General: Morbidly obese Caucasian male appearing in no acute distress. Head: Normocephalic, atraumatic.  Neck: Supple without bruits, JVD unable to be assessed secondary to body habitus. Lungs:  Resp regular and unlabored, CTA without wheezing or rales. Improved aeration at the bases. Heart: RRR, S1, S2, no S3, S4, or murmur; no rub. Abdomen: Soft, non-tender, non-distended with normoactive bowel sounds. No hepatomegaly. No rebound/guarding. No obvious abdominal masses. Extremities: No clubbing or cyanosis, 1+ edema bilaterally.  Distal pedal pulses are 2+ bilaterally. Mild ecchymosis along right wrist. No evidence of a hematoma. Neuro: Alert and oriented X 3. Moves all extremities spontaneously. Psych: Normal affect. Left sided nephrostomy tube in place with bloody drainage   Labs    CBC  Recent Labs  07/29/15 0618 07/31/15 0516  WBC 6.6 6.6  HGB 11.8* 11.6*  HCT 34.8* 34.3*  MCV 95.5 93.4  PLT 214 [id number]   Basic Metabolic Panel  Recent Labs  07/29/15 0616  NA 131*  K 3.3*  CL 86*  CO2 34*  GLUCOSE 127*  BUN 67*  CREATININE 1.75*  CALCIUM 8.8*  MG 2.0     Telemetry    NSR, HR in 80's - 90's. Occasional PVC's.  ECG    No new tracings.   Cardiac Studies and Radiology    US Renal: 07/24/2015  CLINICAL DATA:  Left-sided kidney stone.  Difficulty urinating. EXAM: RENAL / URINARY TRACT ULTRASOUND COMPLETE COMPARISON:  Radiographs today. FINDINGS: Right Kidney: Length:  11.9 cm. Echogenicity within normal limits. No mass or hydronephrosis visualized. Left Kidney: Length: 14.1 cm. The left kidney is suboptimally visualized, partly obscured by bowel gas. There is a probable cyst in the interpolar region measuring approximately 4.1 x 3.6 x 4.1 cm. The left renal pelvis appears mildly distended. The calcifications demonstrated on the radiographs are not well seen, probably within the ureter. Bladder: Appears normal for degree of bladder distention. Ureteral jets were not visualized. Pelvic ascites noted. IMPRESSION: 1. Suboptimal visualization of the left kidney due to bowel gas. The left renal pelvis appears dilated, suggesting obstruction of the left ureter by the calcifications demonstrated on radiographs. 2. Left renal cyst. 3. Pelvic ascites. 4. CT suggested for further evaluation. Electronically Signed   By: Richardean Sale M.D.   On: 07/24/2015 19:32   Dg Abd 2 Views: 07/24/2015  CLINICAL DATA:  Left kidney stone.  Difficulty urinating. EXAM: ABDOMEN - 2 VIEW COMPARISON:  None. FINDINGS: Normal bowel gas pattern without free peritoneal air. Adjacent 2.8 x 1.2 cm and 1.4 x 0.9 cm oval calcifications in the expected position of the proximal to mid left ureter. Mild lumbar spine degenerative changes. IMPRESSION: 2.8 cm and 1.4 cm probable left ureteral calculi. Calcified lymph nodes are less likely. Electronically Signed   By: Claudie Revering M.D.   On: 07/24/2015 12:53   Ct Renal Stone Study: 07/25/2015  CLINICAL DATA:  Left flank pain.  History kidney stones. EXAM: CT ABDOMEN AND PELVIS WITHOUT CONTRAST TECHNIQUE: Multidetector CT imaging of the abdomen and pelvis was performed following the standard protocol without IV contrast. COMPARISON:  Renal ultrasound and abdominal radiographs 1 day prior. FINDINGS: Lower chest: Heart mildly enlarged. Coronary artery calcifications are seen. There is a small left pleural effusion. Liver: Enlarged with diffusely decreased density consistent  with steatosis. Hepatobiliary: Gallbladder physiologically distended, no calcified stone. No biliary dilatation. Pancreas: No ductal dilatation or inflammation. Spleen: Normal in size measuring 11.6 cm greatest dimension. Adrenal glands: No nodule. Kidneys: There are 2 large stones in the mid proximal left ureter. More proximal stone measures 1.3 x 1.1 x 2.2 cm, with an adjacent stone measuring 2.5 x 1.6 x 1.4 cm. Moderate proximal hydroureteronephrosis with increased density of the urine in the renal collecting system, suggestive of blood. Ureter distal to this is decompressed, however is an additional 0.9 x 1.0 x 0.6 cm stone in the left distal ureter just proximal to the ureterovesicular junction. There is thinning of the left renal parenchyma. Small  nonobstructing stone in the lower left kidney. Cyst in the interpolar left kidney measures 3.9 cm. No stones or obstructive uropathy of the right kidney. Right ureter is decompressed. Stomach/Bowel: Limited bowel assessment secondary to lack of enteric contrast, intra-abdominal ascites, and soft tissue attenuation from body habitus. Stomach physiologically distended with ingested contents. There are no dilated or thickened small bowel loops. Small volume of stool throughout the colon without colonic wall thickening. The appendix is not confidently identified. Vascular/Lymphatic: No retroperitoneal adenopathy. Abdominal aorta is normal in caliber moderate aortic atherosclerosis without aneurysm. Reproductive: Prostatic calcifications. Bladder: Decompressed, probable intraluminal blood. Other: Moderate volume of intra-abdominal and pelvic ascites. Free fluid in the mesenteric, pelvis, both pericolic gutters in both upper quadrants. Mild mesenteric and whole body wall edema. No free air. No loculated intra-abdominal fluid collection. Musculoskeletal: There are no acute or suspicious osseous abnormalities. Facet arthropathy in the lower lumbar spine. IMPRESSION: 1. Left  hydroureteronephrosis with 2 large stones in the mid proximal ureter measuring 2.2 and 2.5 cm. Ureter distal to this is decompressed, however there is an additional 1 cm stone in the distal left ureter, just proximal to the ureterovesicular junction. Increased density of the urine in the bladder and left renal collecting system consistent with hemorrhage. 2. Moderate volume intra-abdominal ascites. There is hepatic steatosis. 3. Atherosclerosis, including coronary artery calcifications. Cardiomegaly and small left effusion in the included lung bases. Electronically Signed   By: Jeb Levering M.D.   On: 07/25/2015 19:45    Cardiac Catheterization: 07/25/2015   Dist RCA lesion, 60% stenosed.  Prox LAD to Mid LAD lesion, 60% stenosed. The lesion was previously treated with a stent (unknown type) greater than two years ago.  Ost 2nd Diag to 2nd Diag lesion, 70% stenosed.  1. Moderately elevated filling pressures with moderate pulmonary hypertension. Mildly reduced cardiac output. Hemodynamic features are suggestive of restrictive physiology. 2. Moderate two-vessel coronary artery disease. Patent proximal LAD stent with 60% in-stent restenosis. The LAD and RCA are moderately calcified. 3. Severely reduced LV systolic function by echocardiogram. Left ventricular angiography was not performed due to chronic kidney disease.  PA pressure: 56/34 with a mean of 42, LV pressure: 95/15 with left ventricular end-diastolic pressure of 26. Cardiac output was 4.86 with a cardiac index of 1.9.   Assessment & Plan    1. Acute on chronic combined systolic and diastolic heart failure - EF of 15-20% with moderate mitral regurgitation and moderate pulmonary hypertension and severely dilated inferior vena cava.  - Slowly improving, taken off Dobutamine 07/21/2015 .  -  His volume status seems to be stable. Continue furosemide to 80 mg by mouth twice daily Along with Metolazone, BB, and Spironolactone.  Will  arrange outpatient f/u with Dr Fletcher Anon.    2. Newly diagnosed atrial fibrillation with rapid ventricular response - converted to NSR on 07/11/2015 - This patients CHA2DS2-VASc Score and unadjusted Ischemic Stroke Rate (% per year) is equal to 4.8 % stroke rate/year from a score of 4 (CHF, HTN, DM, Vascular). -Continue amiodarone to 200 mg once daily. - Telemetry shows no evidence of recurrent atrial fibrillation. On Eliquis ok to hold if needed for bloody nephrostomy tube drainage  Hct is stable   3. Coronary artery disease with previous PCI/ Elevated Troponin - Cath on 07/25/2015 showed moderate 2-vessel CAD with patent proximal LAD stent with 60% in-stent restenosis. - continue statin and BB. Stable no angina   4. Morbid obesity - BMI of 42.8 on admission. - Likely has sleep  apnea as well, needs CPAP  5. Acute on chronic Stage 3 CKD/ Obstructing Left Ureteral Stones. Renal function is stable.  6. Uncontrolled Type 2 DM - A1c 11.0 this admission - Drinks significant volume of sweet tea and reports he wants his first meal at the time of discharge to be fried chicken - education has been provided by the Diabetes Coordinator as well as nursing staff and his providers.  7. OSA - reports his CPAP machine at home is broke. Unable to tolerate CPAP thus far this admission due to feeling like he is being smothered. - will likely need O2 going home for nocturnal use until he can have a repeat sleep study.  The patient is stable from a cardiac standpoint.  ? D/C when plan for nephrostomy tube and home care for it finalized  Signed, Jenkins Rouge , MD 10:41 AM 07/31/2015

## 2015-07-31 NOTE — Progress Notes (Signed)
A&O. Independent. O2 at 2L. Nephrostomy tube in place. Draining minimal dark red brown fluid. Voiding well. PICC line in place. Refuses bed alarm.

## 2015-08-01 ENCOUNTER — Telehealth: Payer: Self-pay

## 2015-08-01 LAB — GLUCOSE, CAPILLARY
GLUCOSE-CAPILLARY: 197 mg/dL — AB (ref 65–99)
Glucose-Capillary: 95 mg/dL (ref 65–99)

## 2015-08-01 MED ORDER — CARVEDILOL 3.125 MG PO TABS: 3.1250 mg | ORAL_TABLET | Freq: Two times a day (BID) | ORAL | Status: DC

## 2015-08-01 MED ORDER — AMIODARONE HCL 200 MG PO TABS: 200.0000 mg | ORAL_TABLET | Freq: Every day | ORAL | Status: DC

## 2015-08-01 MED ORDER — APIXABAN 5 MG PO TABS
5.0000 mg | ORAL_TABLET | Freq: Two times a day (BID) | ORAL | Status: DC
Start: 2015-08-01 — End: 2015-08-08

## 2015-08-01 MED ORDER — SPIRONOLACTONE 25 MG PO TABS: 25.0000 mg | ORAL_TABLET | Freq: Every day | ORAL | Status: DC

## 2015-08-01 MED ORDER — POTASSIUM CHLORIDE CRYS ER 20 MEQ PO TBCR: 20.0000 meq | EXTENDED_RELEASE_TABLET | Freq: Every day | ORAL | Status: DC

## 2015-08-01 MED ORDER — CALCIUM POLYCARBOPHIL 625 MG PO TABS: 625.0000 mg | ORAL_TABLET | Freq: Every day | ORAL | Status: DC

## 2015-08-01 MED ORDER — DOCUSATE SODIUM 100 MG PO CAPS: 100.0000 mg | ORAL_CAPSULE | Freq: Two times a day (BID) | ORAL | Status: DC

## 2015-08-01 MED ORDER — OXYCODONE-ACETAMINOPHEN 5-325 MG PO TABS: 1.0000 | ORAL_TABLET | ORAL | Status: DC | PRN

## 2015-08-01 MED ORDER — FUROSEMIDE 80 MG PO TABS: 80.0000 mg | ORAL_TABLET | Freq: Two times a day (BID) | ORAL | Status: DC

## 2015-08-01 MED ORDER — INSULIN ASPART 100 UNIT/ML ~~LOC~~ SOLN
7.0000 [IU] | Freq: Three times a day (TID) | SUBCUTANEOUS | Status: DC
Start: 2015-08-01 — End: 2015-08-01

## 2015-08-01 MED ORDER — APIXABAN 5 MG PO TABS: 5.0000 mg | ORAL_TABLET | Freq: Two times a day (BID) | ORAL | Status: DC

## 2015-08-01 MED ORDER — METOLAZONE 2.5 MG PO TABS: 2.5000 mg | ORAL_TABLET | Freq: Every day | ORAL | Status: DC

## 2015-08-01 MED ORDER — ALPRAZOLAM 0.25 MG PO TABS: 0.2500 mg | ORAL_TABLET | Freq: Three times a day (TID) | ORAL | Status: DC | PRN

## 2015-08-01 MED ORDER — INSULIN GLARGINE 100 UNIT/ML ~~LOC~~ SOLN: 5.0000 [IU] | Freq: Every day | SUBCUTANEOUS | Status: DC

## 2015-08-01 MED ORDER — ATORVASTATIN CALCIUM 40 MG PO TABS: 40.0000 mg | ORAL_TABLET | Freq: Every day | ORAL | Status: DC

## 2015-08-01 NOTE — Progress Notes (Signed)
The RN was trying to qualify patient for home 02, did 02 at 2 L and it was 100%, 02 on R/A at rest and it was 92% . Wanted to walk patient so 02 during ambulation can be measured. Patient stated he needed to use a walker to walk. The RN brought in a walker but patient stated he prefers the PT to walk him. Will pass the information on to the incoming RN. No c/o pain or any acute respiratory distress noted. Will continue to monitor.

## 2015-08-01 NOTE — Progress Notes (Addendum)
Physical Therapy Treatment Patient Details Name: [name] MRN: [id number] DOB: [birth year] Today's Date: 08/01/2015    History of Present Illness Patient is a 65 y/o male that presents with orthopnea, nocturnal dyspnea, and LE swelling determined to have acute on chronic CHF. He was found to be in a-fib on admission and was started on amioderone drip. Became hypotensive on amio drip, which was dc'd.     PT Comments    Pt scheduled for discharge home today; nursing calls and requests pt be seen. Pt wishes bariatric rolling walker for ambulation. Pt receives bariatric rolling walker at start of session. Adjusted for appropriate height. Pt demonstrating ambulation and stair climbing slowly, but safely. Pt able to transfer and take several steps without assistive device steadily and safely. Pt fatigues after 170 ft ambulation and ascending/descending 6 steps; requiring short seated rest break. At that time O2 saturation 93% on room air and heart rate 98 beats per minute. Pt has met ambulation goal and is safe to discharge home with home health. Pt notes he has a roommate who will be able to assist him if needed.   Follow Up Recommendations  Home health PT     Equipment Recommendations  Rolling walker with 5" wheels    Recommendations for Other Services       Precautions / Restrictions Precautions Precautions: Fall Restrictions Weight Bearing Restrictions: No    Mobility  Bed Mobility Overal bed mobility: Modified Independent             General bed mobility comments: use of rails  Transfers Overall transfer level: Independent               General transfer comment: Pt stands independently and takes few steps to/from rw   Ambulation/Gait Ambulation/Gait assistance: Supervision;Modified independent (Device/Increase time) Ambulation Distance (Feet): 170 Feet (short seated rest break and continued 60 additional ft) Assistive device: Rolling walker (2 wheeled) Gait  Pattern/deviations: Step-through pattern;Trunk flexed (mild trunk flexion) Gait velocity: rediced Gait velocity interpretation: <1.8 ft/sec, indicative of risk for recurrent falls (1.25 ft/sec) General Gait Details: other than decreased speed; no issues   Stairs Stairs: Yes Stairs assistance: Min guard Stair Management: Two rails;Step to pattern Number of Stairs: 6 General stair comments: slow; no LOB. Steps done consecutively with 170 ft walk before resting, O2 saturation increased to 93% on room air and HR to 98 bpm  Wheelchair Mobility    Modified Rankin (Stroke Patients Only)       Balance           Standing balance support: Bilateral upper extremity supported Standing balance-Leahy Scale: Good                      Cognition Arousal/Alertness: Awake/alert Behavior During Therapy: WFL for tasks assessed/performed Overall Cognitive Status: Within Functional Limits for tasks assessed                      Exercises      General Comments        Pertinent Vitals/Pain Pain Assessment: 0-10 Pain Score: 5  Pain Location: Back    Home Living                      Prior Function            PT Goals (current goals can now be found in the care plan section) Progress towards PT goals: Progressing toward goals    Frequency  Min 2X/week    PT Plan Current plan remains appropriate    Co-evaluation             End of Session   Activity Tolerance: Patient tolerated treatment well Patient left: with nursing/sitter in room;Other (comment) (sitting edge of bed)     Time: 2549-8264 PT Time Calculation (min) (ACUTE ONLY): 31 min  Charges:  $Gait Training: 23-37 mins                    G Codes:      Charlaine Dalton, PTA 08/01/2015, 12:21 PM

## 2015-08-01 NOTE — Telephone Encounter (Signed)
Patient contacted regarding discharge from [facility] on 08/01/15.  Patient understands to follow up with Dr. Yvone Neu on 08/15/15 at 1:00 at [facility]. Patient understands discharge instructions? yes Patient understands medications and regimen? yes Patient understands to bring all medications to this visit? yes

## 2015-08-01 NOTE — Progress Notes (Signed)
SATURATION QUALIFICATIONS: (This note is used to comply with regulatory documentation for home oxygen)  Patient Saturations on Room Air at Rest = 95%  Patient Saturations on Room Air while Ambulating = 93%     Please briefly explain why patient needs home oxygen: 

## 2015-08-01 NOTE — Telephone Encounter (Signed)
Attempted to contact pt regarding discharge from [facility] on 08/01/15. Left message asking pt to call back regarding discharge instructions and/or medications. Advised pt of appt w/ Dr. Yvone Neu on 08/15/15 at 1:00 w/ [facility]. Asked pt to call back if unable to keep this appt.

## 2015-08-01 NOTE — Progress Notes (Signed)
Patient received discharge instructions, pt verbalized understanding. IV was removed with no signs of infection. Dressing clean, dry intact. No skin tears or wounds present. Prescription was printed and given to patient. Patient was escorted out with staff member via wheelchair via private auto.  Patient received wheelchair. No further needs from care management team. Patient educated on how to care for percutaneous nephrostomy tube, information has been provided to the patient.

## 2015-08-01 NOTE — Telephone Encounter (Signed)
-----   Message from Arie Sabina sent at 08/01/2015  9:29 AM EST ----- Regarding: tcm/ph 08/15/15 @1 :00  Dr. Yvone Neu

## 2015-08-01 NOTE — Discharge Instructions (Signed)
[facility] Failure Clinic appointment on August 08, 2015 at 11:00am with Darylene Price, [place]. Please call [phone] to reschedule.    Urology: Percutaneous Nephrostomy Home Guide A nephrostomy tube allows urine to leave your body when a medical condition prevents it from leaving your kidney normally. Urine is normally carried from the kidneys to the bladder through narrow tubes called ureters. The ureter can become obstructed due to conditions such as kidney stones, tumors, infection, or blood clots. A nephrostomy tube is a hollow, flexible tube placed into the kidney to restore the flow of urine. The tube is placed on the right or left side of your lower back and is connected to an external drainage bag. PERSONAL HYGIENE  You may shower unless otherwise told by your health care provider. Prepare for a shower by placing a plastic covering over the nephrostomy tube dressing.  Change the dressing immediately after showering. Make sure your skin around the nephrostomy tube exit site is dry.  Avoid immersing your nephrostomy tube in water, such as taking a bath or swimming. NEPHROSTOMY TUBE CARE  Your nephrostomy tube is connected to a leg bag or bedside drainage bag. Always keep your tubing, leg bag, or bedside drainage bag below the level of your kidney so that your urine drains freely.  Your activity is not restricted as long as your activity does not pull or tug on your tube.  During the day, if you are connecting your nephrostomy tube to a leg bag, ensure that your tubing does not have any kinks and that your urine is draining freely. One helpful technique to prevent kinking or inadvertent dislodging of your nephrostomy tubing is to gently wrap an elastic bandage over the tubing. This will help to secure the tubing in place. Make sure there is no tension on the tubing so it does not become dislodged.  At night, you may want to connect your nephrostomy tube to a larger bedside drainage bag. EMPTYING  THE LEG BAG OR BEDSIDE DRAINAGE BAG  The leg bag or bedside drainage bag should be emptied when it becomes  full and before going to sleep. Most leg bags and bedside drainage bags have a drain at the bottom that allows urine to be emptied. 1. Hold the leg bag or bedside drainage bag over a toilet or collection container. Use a measuring container if you are directed to measure your urine. 2. Open the drain and allow the urine to drain. 3. Once all the urine is drained from the leg bag or bedside drainage bag, close the drain fully to avoid urine leakage. 4. Flush urine down toilet. If a collection container was used, rinse the container. NEPHROSTOMY TUBE EXIT SITE CARE The exit site for your nephrostomy tube is covered with a bandage (dressing). Clean your exit site and change your dressing as directed by your health care provider or if your dressing becomes wet. Supplies Needed:  Mild soap and water.  44 inch (10x10 cm) split gauze pads.  44 inch (10x10 cm) gauze pads.  Paper tape. Exit Site Care and Dressing Change:For the first 2 weeks after having a nephrostomy tube inserted, you should change the dressing every day. After 2 weeks, you can change the dressing 2 times per week, whenever the dressing becomes wet, or as told by your health care provider. Because of the location of your nephrostomy tube, you may need help from another person to complete dressing changes. The steps to changing a dressing are: 1. Wash hands well with  soap and water. 2. Gently remove the tapes and dressing from around the nephrostomy tube. Be careful not to pull on the tube while removing the dressing. Avoid using scissors to remove the dressing since this may lead to accidental damage to the tube. 3. Wash the skin around the tube with the mild soap and water, rinse well, and dry with a clean cloth. 4. Inspect the skin around the drain for redness, swelling, and foul-smelling yellow or green discharge. 5. If the  drain was sutured to the skin, inspect the suture to verify that it is still anchored in the skin. 6. Place two split gauze pads in and around the tube exit site. Do not apply ointments or alcohol to the site. 7. Place a gauze pad on top of the split gauze pad. 8. Coil the tube on top of the gauze. The tubing should rest on the gauze, not on the skin. 9. Place tape around each edge of the gauze pad. 10. Secure the nephrostomy tubing. Ensure that the nephrostomy tube does not kink or become pinched. The tubing should rest on the gauze pad and not on the skin. 11. Dispose of used supplies properly. FLUSHING YOUR NEPHROSTOMY TUBE  Flush your nephrostomy tube as directed by your health care provider. Flushing of a nephrostomy tube is easier if a three-way stopcock is placed between the tube and the leg bag or bedside drainage bag. One connection of the three-way stopcock connects to your tube, the second connects to the leg bag or bedside drainage bag, and the third connection is usually covered with a cap. The three-way stopcock lever points to the direction on the stopcock that is closed to flow. Normally, the lever points in the direction of the cap to allow urine to drain from the tube to the leg bag or bedside drainage bag. Supplies Needed:  Rubbing alcohol wipe.  10 mL 0.9% saline syringe. Flushing Your Nephrostomy Tube 1. Gather needed supplies. 2. Move the lever of the three-way stopcock so that it points toward the leg bag or bedside drainage bag. 3. Clean the cap with a rubbing alcohol wipe and then screw the tip of a 10 mL 0.9% saline syringe onto the cap. 4. Using the syringe plunger, slowly push the 10 mL 0.9% saline in the syringe over 5-10 seconds. If resistance is met or pain occurs while pushing, stop pushing the saline immediately. 5. Remove the syringe from the cap. 6. Return the stopcock lever to the usual position which is pointing in the direction of the cap. 7. Dispose of used  supplies properly. REPLACING YOUR LEG BAG OR BEDSIDE DRAINAGE BAG  Replace your leg bag or bedside drainage bag, three-way stopcock, and any extension tubing as directed by your health care provider. Make sure you always have an extra drainage bag and connecting tubing available. 1. Empty urine from your leg bag or bedside drainage bag. 2. Gather new leg bag or bedside drainage bag, three-way stopcock, and any extension tubing. 3. Remove the leg bag or bedside drainage bag, three-way stopcock, and any extension tubing from the nephrostomy tube. 4. Attach the new leg bag or bedside drainage bag, three-way stopcock, and any extension tubing to the nephrostomy tube. 5. Dispose of the used leg bag or bedside drainage bag, three-way stopcock, and any extension tubing from the nephrostomy tube. SEEK IMMEDIATE MEDICAL CARE IF:  You have a fever.  You have abdominal pain during the first week after nephrostomy tube insertion.  You have new  appearance of blood in your urine.  You have drainage, redness, swelling or pain at your nephrostomy tube insertion site.  You have difficulty or pain with flushing the tube.  You notice a decrease in your urine output not explained by drinking less fluids.  Your nephrostomy tube comes out or the suture securing the tube comes free.   Your nephrostomy tube should be flushed every other day for the first week and then as needed thereafter if it stops draining.

## 2015-08-01 NOTE — Discharge Summary (Signed)
[name] at [place] NAME: [name]    MR#:  [id number]  DATE OF BIRTH:  02-06-1951  DATE OF ADMISSION:  07/08/2015 ADMITTING PHYSICIAN: Demetrios Loll, MD  DATE OF DISCHARGE: No discharge date for patient encounter.  PRIMARY CARE PHYSICIAN: No PCP Per Patient    ADMISSION DIAGNOSIS:  Acute on chronic congestive heart failure, unspecified congestive heart failure type ([place]) [I50.9]  DISCHARGE DIAGNOSIS:  Acute on chronic systolic congestive heart failure New onset atrial fibrillation Obstructive nephropathy requiring nephrostomy tube   SECONDARY DIAGNOSIS:   Past Medical History  Diagnosis Date  . Chronic combined systolic (congestive) and diastolic (congestive) heart failure (HCC)     a. EF 25% by cath in 2013 b. Echo in 07/2015 showing EF of 15-20%, moderate MR, moderate Pulm HTN, severely dilated IVC  . Sleep apnea   . Diabetes mellitus type 2, uncontrolled ([place])     a. A1c 11.0 in 07/2015.  [name] Renal disorder     kidney stone  . Hypertension   . New onset atrial fibrillation ([place])     a. diagnosed in 07/2015 b. started on Eliquis  . CKD (chronic kidney disease) stage 3, GFR 30-59 ml/min     HOSPITAL COURSE:  [name]  is a 65 y.o. Love admitted 07/08/2015 with chief complaint of shortness of breath and swelling. Please see H&P performed by Dr. Bridgett Larsson for further information. On original presentation he had signs and symptoms of worsening congestive heart failure. He was started on IV diuresis with Lasix however, progressed slowly. Cardiology became involved in his case echocardiogram performed revealing ejection fraction of 15%. He was eventually placed on high dose diuretics still with minimal response. At that time he was started on dobutamine drip to increase cardiac output. He symptomatically improved and was able to eventually be weaned off of dobutamine and Lasix switched over to oral which she has remained stable on. Also  during his course he developed new onset atrial fibrillation which became rate controlled with amiodarone, he was initiated on Eliquis for anticoagulation. The other issue in his care during his hospitalization was in regards to flank pain and nephrolithiasis. [name] evaluated by urology and underwent nephrostomy tube placement on the left. [name] being discharged home with home health and will have follow-up with urology in cardiology as an outpatient.  DISCHARGE CONDITIONS:   Stable/improved  CONSULTS OBTAINED:  Treatment Team:  Will Meredith Leeds, MD Hollice Espy, MD  DRUG ALLERGIES:  No Known Allergies  DISCHARGE MEDICATIONS:   Current Discharge Medication List    START taking these medications   Details  ALPRAZolam (XANAX) 0.25 MG tablet Take 1 tablet (0.25 mg total) by mouth every 8 (eight) hours as needed for anxiety. Qty: 30 tablet, Refills: 0    amiodarone (PACERONE) 200 MG tablet Take 1 tablet (200 mg total) by mouth daily. Qty: 30 tablet, Refills: 0    !! apixaban (ELIQUIS) 5 MG TABS tablet Take 1 tablet (5 mg total) by mouth 2 (two) times daily. Qty: 60 tablet, Refills: 0    !! apixaban (ELIQUIS) 5 MG TABS tablet Take 1 tablet (5 mg total) by mouth 2 (two) times daily. Qty: 60 tablet, Refills: 12    atorvastatin (LIPITOR) 40 MG tablet Take 1 tablet (40 mg total) by mouth daily at 6 PM. Qty: 30 tablet, Refills: 0    carvedilol (COREG) 3.125 MG tablet Take 1 tablet (3.125 mg total) by mouth 2 (two)  times daily with a meal. Qty: 60 tablet, Refills: 0    docusate sodium (COLACE) 100 MG capsule Take 1 capsule (100 mg total) by mouth 2 (two) times daily. Qty: 30 capsule, Refills: 0    metolazone (ZAROXOLYN) 2.5 MG tablet Take 1 tablet (2.5 mg total) by mouth daily. Qty: 30 tablet, Refills: 0    oxyCODONE-acetaminophen (PERCOCET/ROXICET) 5-325 MG tablet Take 1 tablet by mouth every 4 (four) hours as needed for moderate pain. Qty: 30 tablet, Refills: 0     polycarbophil (FIBERCON) 625 MG tablet Take 1 tablet (625 mg total) by mouth daily. Qty: 30 tablet, Refills: 0    potassium chloride SA (K-DUR,KLOR-CON) 20 MEQ tablet Take 1 tablet (20 mEq total) by mouth daily. Qty: 30 tablet, Refills: 0     !! - Potential duplicate medications found. Please discuss with provider.    CONTINUE these medications which have CHANGED   Details  furosemide (LASIX) 80 MG tablet Take 1 tablet (80 mg total) by mouth 2 (two) times daily. Qty: 60 tablet, Refills: 0    !! spironolactone (ALDACTONE) 25 MG tablet Take 1 tablet (25 mg total) by mouth daily. Qty: 30 tablet, Refills: 0     !! - Potential duplicate medications found. Please discuss with provider.    CONTINUE these medications which have NOT CHANGED   Details  b complex vitamins tablet Take 1 tablet by mouth daily.    cholecalciferol (VITAMIN D) 1000 units tablet Take 2,000 Units by mouth daily.    Cranberry 500 MG CAPS Take 1,000 mg by mouth daily.    glipiZIDE (GLUCOTROL) 10 MG tablet Take 20-30 mg by mouth 2 (two) times daily. Pt takes three tablets in the morning and two tablets at bedtime.    Hawthorne Berry 550 MG CAPS Take 550 mg by mouth daily.    metFORMIN (GLUCOPHAGE) 500 MG tablet Take 1,000 mg by mouth 2 (two) times daily with a meal.    multivitamin-lutein (OCUVITE-LUTEIN) CAPS capsule Take 1 capsule by mouth 2 (two) times daily.    Olive Leaf Extract 250 MG CAPS Take 250 mg by mouth 2 (two) times daily.    !! OVER THE COUNTER MEDICATION Take 3 capsules by mouth 2 (two) times daily. Pt takes beet root.    !! OVER THE COUNTER MEDICATION Take 1 capsule by mouth daily. Pt takes caprylic acid.    !! spironolactone (ALDACTONE) 25 MG tablet Take 25 mg by mouth at bedtime.     TURMERIC CURCUMIN PO Take 1 capsule by mouth daily.    vitamin C (ASCORBIC ACID) 500 MG tablet Take 1,000 mg by mouth daily.    vitamin E 400 UNIT capsule Take 400 Units by mouth daily.     !! - Potential  duplicate medications found. Please discuss with provider.    STOP taking these medications     hydrochlorothiazide (HYDRODIURIL) 25 MG tablet      metoprolol succinate (TOPROL-XL) 25 MG 24 hr tablet          DISCHARGE INSTRUCTIONS:    DIET:  Diabetic diet  DISCHARGE CONDITION:  Stable  ACTIVITY:  Activity as tolerated  OXYGEN:  Home Oxygen: No.   Oxygen Delivery: room air  DISCHARGE LOCATION:  home   If you experience worsening of your admission symptoms, develop shortness of breath, life threatening emergency, suicidal or homicidal thoughts you must seek medical attention immediately by calling 911 or calling your MD immediately  if symptoms less severe.  You Must read complete  instructions/literature along with all the possible adverse reactions/side effects for all the Medicines you take and that have been prescribed to you. Take any new Medicines after you have completely understood and accpet all the possible adverse reactions/side effects.   Please note  You were cared for by a hospitalist during your hospital stay. If you have any questions about your discharge medications or the care you received while you were in the hospital after you are discharged, you can call the unit and asked to speak with the hospitalist on call if the hospitalist that took care of you is not available. Once you are discharged, your primary care physician will handle any further medical issues. Please note that NO REFILLS for any discharge medications will be authorized once you are discharged, as it is imperative that you return to your primary care physician (or establish a relationship with a primary care physician if you do not have one) for your aftercare needs so that they can reassess your need for medications and monitor your lab values.    On the day of Discharge:   VITAL SIGNS:  Blood pressure 108/66, pulse 81, temperature 97.8 F (36.6 C), temperature source Oral, resp. rate  18, height 6' (1.829 m), weight 294 lb 8 oz (133.584 kg), SpO2 100 %.  I/O:   Intake/Output Summary (Last 24 hours) at 08/01/15 0916 Last data filed at 08/01/15 0800  Gross per 24 hour  Intake    480 ml  Output   6000 ml  Net  -5520 ml    PHYSICAL EXAMINATION:  GENERAL:  65 y.o.-year-old patient lying in the bed with no acute distress.  EYES: Pupils equal, round, reactive to light and accommodation. No scleral icterus. Extraocular muscles intact.  HEENT: Head atraumatic, normocephalic. Oropharynx and nasopharynx clear.  NECK:  Supple, no jugular venous distention. No thyroid enlargement, no tenderness.  LUNGS: Normal breath sounds bilaterally, no wheezing, rales,rhonchi or crepitation. No use of accessory muscles of respiration.  CARDIOVASCULAR: S1, S2 normal. No murmurs, rubs, or gallops.  ABDOMEN: Soft, non-tender, non-distended. Bowel sounds present. No organomegaly or mass.  EXTREMITIES: No pedal edema, cyanosis, or clubbing.  NEUROLOGIC: Cranial nerves II through XII are intact. Muscle strength 5/5 in all extremities. Sensation intact. Gait not checked.  PSYCHIATRIC: The patient is alert and oriented x 3.  SKIN: No obvious rash, lesion, or ulcer.   DATA REVIEW:   CBC  Recent Labs Lab 07/31/15 0516  WBC 6.6  HGB 11.6*  HCT 34.3*  PLT 183    Chemistries   Recent Labs Lab 07/29/15 0616  NA 131*  K 3.3*  CL 86*  CO2 34*  GLUCOSE 127*  BUN 67*  CREATININE 1.75*  CALCIUM 8.8*  MG 2.0    Cardiac Enzymes No results for input(s): TROPONINI in the last 168 hours.  Microbiology Results  Results for orders placed or performed during the hospital encounter of 07/08/15  MRSA PCR Screening     Status: None   Collection Time: 07/18/15  2:42 PM  Result Value Ref Range Status   MRSA by PCR NEGATIVE NEGATIVE Final    Comment:        The GeneXpert MRSA Assay (FDA approved for NASAL specimens only), is one component of a comprehensive MRSA colonization surveillance  program. It is not intended to diagnose MRSA infection nor to guide or monitor treatment for MRSA infections.   Urine culture     Status: None   Collection Time: 07/26/15 11:19 AM  Result Value Ref Range Status   Specimen Description URINE, RANDOM  Final   Special Requests NONE  Final   Culture NO GROWTH 1 DAY  Final   Report Status 07/27/2015 FINAL  Final    RADIOLOGY:  No results found.   Management plans discussed with the patient, family and they are in agreement.  CODE STATUS:     Code Status Orders        Start     Ordered   07/15/15 2355  Full code   Continuous     07/15/15 2354    Code Status History    Date Active Date Inactive Code Status Order ID Comments User Context   07/08/2015 10:28 PM 07/15/2015 11:54 PM Full Code [id number]  Demetrios Loll, MD Inpatient    Advance Directive Documentation        Most Recent Value   Type of Advance Directive  Healthcare Power of Attorney   Pre-existing out of facility DNR order (yellow form or pink MOST form)     "MOST" Form in Place?        TOTAL TIME TAKING CARE OF THIS PATIENT: 45 minutes.    [name],  [name].D on 08/01/2015 at 9:16 AM  Between 7am to 6pm - Pager - [phone]  After 6pm go to [email or web address] - password [email or web address] [facility]  [facility]  Office  [phone]  CC: Primary care physician; No PCP Per Patient

## 2015-08-01 NOTE — Care Management (Signed)
Patient is for discharge home today.  Discussed with primary nurse that patient must be able to demonstrate nephrostomy care/maintence.  [facility]: made notation that clinic will not be able to fill xanax or oxycodone.  [facility] notified of discharge and a bariatric walker has been provided.  Patient has not qualified for home 02.  Dr Fletcher Anon with [facility] cardiology will sign home health orders but office is not to be called for any issues with the nephrostomy tube.  He has an appointment scheduled at [facility] for 3/21 to get established with pcp.  Patient does not wish to be followed by a clinic in [place].  After he gets established at [facility], That PCP should follow for home health orders

## 2015-08-02 ENCOUNTER — Telehealth: Payer: Self-pay | Admitting: Urology

## 2015-08-02 NOTE — Telephone Encounter (Signed)
Malachy Mood, [facility] Nurse, called regarding this patient.  He was discharged from the hospital 08/01/2015 with nephrostomy tube.  Area marked on gauze when he left the hospital at the site of the tube.  Patient has steady oozing from the tube opening and dried blood.  She cleaned the area but is concerned about the oozing.  She does not have any orders to flush the tube.  Please advise Fonnie Jarvis, CMA on how to proceed.  Cheryl [phone]

## 2015-08-02 NOTE — Telephone Encounter (Signed)
Hold pressure around the tube for 10 min and see if you can get it to stop.  If not, apply a pressure dressing.  Flush with 10 cc sterile water every other day or prn if not draining or bloody.  Hollice Espy, MD

## 2015-08-02 NOTE — Telephone Encounter (Signed)
Spoke with Malachy Mood (home health nurse) and gave her a verbal order per Dr. Cherrie Gauze instructions, she also requested a written order be faxed to [phone] att: Anderson Malta, this was also done.

## 2015-08-08 ENCOUNTER — Telehealth: Payer: Self-pay | Admitting: *Deleted

## 2015-08-08 ENCOUNTER — Ambulatory Visit: Payer: BLUE CROSS/BLUE SHIELD | Attending: Family | Admitting: Family

## 2015-08-08 ENCOUNTER — Encounter: Payer: Self-pay | Admitting: Family

## 2015-08-08 VITALS — BP 87/65 | HR 94 | Resp 20 | Ht 72.0 in | Wt 270.0 lb

## 2015-08-08 DIAGNOSIS — I952 Hypotension due to drugs: Secondary | ICD-10-CM

## 2015-08-08 DIAGNOSIS — I129 Hypertensive chronic kidney disease with stage 1 through stage 4 chronic kidney disease, or unspecified chronic kidney disease: Secondary | ICD-10-CM | POA: Diagnosis not present

## 2015-08-08 DIAGNOSIS — E1165 Type 2 diabetes mellitus with hyperglycemia: Secondary | ICD-10-CM | POA: Diagnosis not present

## 2015-08-08 DIAGNOSIS — I5022 Chronic systolic (congestive) heart failure: Secondary | ICD-10-CM | POA: Insufficient documentation

## 2015-08-08 DIAGNOSIS — N183 Chronic kidney disease, stage 3 (moderate): Secondary | ICD-10-CM | POA: Insufficient documentation

## 2015-08-08 DIAGNOSIS — Z936 Other artificial openings of urinary tract status: Secondary | ICD-10-CM | POA: Diagnosis not present

## 2015-08-08 DIAGNOSIS — R42 Dizziness and giddiness: Secondary | ICD-10-CM | POA: Insufficient documentation

## 2015-08-08 DIAGNOSIS — Z7984 Long term (current) use of oral hypoglycemic drugs: Secondary | ICD-10-CM | POA: Diagnosis not present

## 2015-08-08 DIAGNOSIS — I5042 Chronic combined systolic (congestive) and diastolic (congestive) heart failure: Secondary | ICD-10-CM | POA: Insufficient documentation

## 2015-08-08 DIAGNOSIS — I959 Hypotension, unspecified: Secondary | ICD-10-CM | POA: Diagnosis not present

## 2015-08-08 DIAGNOSIS — I4891 Unspecified atrial fibrillation: Secondary | ICD-10-CM | POA: Diagnosis not present

## 2015-08-08 DIAGNOSIS — G4730 Sleep apnea, unspecified: Secondary | ICD-10-CM | POA: Diagnosis not present

## 2015-08-08 DIAGNOSIS — Z9889 Other specified postprocedural states: Secondary | ICD-10-CM | POA: Insufficient documentation

## 2015-08-08 DIAGNOSIS — Z7901 Long term (current) use of anticoagulants: Secondary | ICD-10-CM | POA: Diagnosis not present

## 2015-08-08 DIAGNOSIS — I272 Other secondary pulmonary hypertension: Secondary | ICD-10-CM | POA: Diagnosis not present

## 2015-08-08 DIAGNOSIS — N1330 Unspecified hydronephrosis: Secondary | ICD-10-CM | POA: Diagnosis not present

## 2015-08-08 DIAGNOSIS — Z87442 Personal history of urinary calculi: Secondary | ICD-10-CM | POA: Diagnosis not present

## 2015-08-08 DIAGNOSIS — Z79899 Other long term (current) drug therapy: Secondary | ICD-10-CM | POA: Diagnosis not present

## 2015-08-08 DIAGNOSIS — I480 Paroxysmal atrial fibrillation: Secondary | ICD-10-CM

## 2015-08-08 LAB — BASIC METABOLIC PANEL
ANION GAP: 14 (ref 5–15)
BUN: 46 mg/dL — ABNORMAL HIGH (ref 6–20)
CHLORIDE: 86 mmol/L — AB (ref 101–111)
CO2: 35 mmol/L — AB (ref 22–32)
Calcium: 9.9 mg/dL (ref 8.9–10.3)
Creatinine, Ser: 1.74 mg/dL — ABNORMAL HIGH (ref 0.61–1.24)
GFR calc non Af Amer: 40 mL/min — ABNORMAL LOW (ref >60)
GFR, EST AFRICAN AMERICAN: 46 mL/min — AB (ref >60)
Glucose, Bld: 172 mg/dL — ABNORMAL HIGH (ref 65–99)
Potassium: 3.7 mmol/L (ref 3.5–5.1)
Sodium: 135 mmol/L (ref 135–145)

## 2015-08-08 NOTE — Telephone Encounter (Signed)
Request for surgical clearance:  1. What type of surgery is being performed? Patient has kidney stones  2. When is this surgery scheduled? Not scheduled yet  3. Are there any medications that need to be held prior to surgery and how long? Patient not sure   4. Name of physician performing surgery? Dr. Bryna Colander  5. What is your office phone and fax number? # [phone]  Patient had a cath done by Dr. Fletcher Anon but not a clinic patient. He has an appt on 08/15/15 with Dr. Yvone Neu. [name] insistant that Dr. Fletcher Anon needs to clear him for kidney surgery.

## 2015-08-08 NOTE — Telephone Encounter (Signed)
S/w pt who reports he has appt Wednesday with urology to determine best treatment to remove kidney stones.  He would like to know if Dr. Fletcher Anon will clear him for surgery. Advised pt to keep March 8 appt w/Dr. Erlene Quan to discuss stone removal options and f/u with Dr. Yvone Neu March 13 for clearance. Pt verbalized understanding and is agreeable w/plan.

## 2015-08-08 NOTE — Progress Notes (Signed)
Subjective:    Patient ID: [name], male    DOB: 08-27-1950, 65 y.o.   MRN: [id number]  Congestive Heart Failure Presents for initial visit. The disease course has been improving. Associated symptoms include fatigue and shortness of breath (at times). Pertinent negatives include no abdominal pain, chest pain, chest pressure, edema, orthopnea or palpitations. The symptoms have been improving. Past treatments include beta blockers and salt and fluid restriction. The treatment provided moderate relief. Compliance with prior treatments has been good. His past medical history is significant for arrhythmia and DM. There is no history of chronic lung disease or HTN. He has one 1st degree relative with heart disease. Compliance with total regimen is 51-75%. Compliance problems include adherence to exercise and adherence to diet.   Other This is a recurrent (hypotension) problem. The current episode started more than 1 month ago. The problem occurs daily. The problem has been waxing and waning. Associated symptoms include fatigue and headaches (daily). Pertinent negatives include no abdominal pain, chest pain, congestion, coughing, nausea, neck pain, sore throat or weakness. Exacerbated by: medications. He has tried nothing for the symptoms.   Past Medical History  Diagnosis Date  . Chronic combined systolic (congestive) and diastolic (congestive) heart failure (HCC)     a. EF 25% by cath in 2013 b. Echo in 07/2015 showing EF of 15-20%, moderate MR, moderate Pulm HTN, severely dilated IVC  . Sleep apnea   . Diabetes mellitus type 2, uncontrolled ([place])     a. A1c 11.0 in 07/2015.  [name] Renal disorder     kidney stone  . Hypertension   . New onset atrial fibrillation ([place])     a. diagnosed in 07/2015 b. started on Eliquis  . CKD (chronic kidney disease) stage 3, GFR 30-59 ml/min     Past Surgical History  Procedure Laterality Date  . Coronary angioplasty with stent placement    . Kidney surgery     . Cardiac catheterization N/A 07/25/2015    Procedure: Right/Left Heart Cath and Coronary Angiography;  Surgeon: [place] Hampshire, MD;  Location: [facility];  Service: Cardiovascular;  Laterality: N/A;    Family History  Problem Relation Age of Onset  . Heart failure Father   . Heart attack Brother     Social History  Substance Use Topics  . Smoking status: Never Smoker   . Smokeless tobacco: Never Used  . Alcohol Use: 0.0 oz/week    0 Standard drinks or equivalent per week     Comment: occasionally    No Known Allergies  Prior to Admission medications   Medication Sig Start Date End Date Taking? Authorizing Provider  ALPRAZolam (XANAX) 0.25 MG tablet Take 1 tablet (0.25 mg total) by mouth every 8 (eight) hours as needed for anxiety. 08/01/15  Yes Lytle Butte, MD  amiodarone (PACERONE) 200 MG tablet Take 1 tablet (200 mg total) by mouth daily. 08/01/15  Yes Lytle Butte, MD  apixaban (ELIQUIS) 5 MG TABS tablet Take 1 tablet (5 mg total) by mouth 2 (two) times daily. 08/01/15  Yes Lytle Butte, MD  atorvastatin (LIPITOR) 40 MG tablet Take 1 tablet (40 mg total) by mouth daily at 6 PM. 08/01/15  Yes Lytle Butte, MD  b complex vitamins tablet Take 1 tablet by mouth daily.   Yes Historical Provider, MD  carvedilol (COREG) 3.125 MG tablet Take 1 tablet (3.125 mg total) by mouth 2 (two) times daily with a meal. 08/01/15  Yes  Lytle Butte, MD  cholecalciferol (VITAMIN D) 1000 units tablet Take 2,000 Units by mouth daily.   Yes Historical Provider, MD  Cranberry 500 MG CAPS Take 1,000 mg by mouth daily.   Yes Historical Provider, MD  furosemide (LASIX) 80 MG tablet Take 1 tablet (80 mg total) by mouth 2 (two) times daily. 08/01/15  Yes Lytle Butte, MD  glipiZIDE (GLUCOTROL) 10 MG tablet Take 20-30 mg by mouth 2 (two) times daily. Pt takes three tablets in the morning and two tablets at bedtime.   Yes Historical Provider, MD  Cathrine Muster 550 MG CAPS Take 550 mg by mouth  daily.   Yes Historical Provider, MD  metFORMIN (GLUCOPHAGE) 500 MG tablet Take 1,000 mg by mouth 2 (two) times daily with a meal.   Yes Historical Provider, MD  metolazone (ZAROXOLYN) 2.5 MG tablet Take 1 tablet (2.5 mg total) by mouth daily. 08/01/15  Yes Lytle Butte, MD  multivitamin-lutein [facility]) CAPS capsule Take 1 capsule by mouth 2 (two) times daily.   Yes Historical Provider, MD  Olive Leaf Extract 250 MG CAPS Take 250 mg by mouth 2 (two) times daily.   Yes Historical Provider, MD  OVER THE COUNTER MEDICATION Take 3 capsules by mouth 2 (two) times daily. Pt takes beet root.   Yes Historical Provider, MD  OVER THE COUNTER MEDICATION Take 1 capsule by mouth daily. Pt takes caprylic acid.   Yes Historical Provider, MD  oxyCODONE-acetaminophen (PERCOCET/ROXICET) 5-325 MG tablet Take 1 tablet by mouth every 4 (four) hours as needed for moderate pain. 08/01/15  Yes Lytle Butte, MD  polyethylene glycol [facility] / GLYCOLAX) packet Take 17 g by mouth daily as needed for mild constipation.   Yes Historical Provider, MD  potassium phosphate, monobasic, (K-PHOS ORIGINAL) 500 MG tablet Take 500 mg by mouth 2 (two) times daily with a meal.   Yes Historical Provider, MD  spironolactone (ALDACTONE) 25 MG tablet Take 25 mg by mouth 2 (two) times daily.    Yes Historical Provider, MD  TURMERIC CURCUMIN PO Take 1 capsule by mouth daily.   Yes Historical Provider, MD  vitamin C (ASCORBIC ACID) 500 MG tablet Take 1,000 mg by mouth daily.   Yes Historical Provider, MD  vitamin E 400 UNIT capsule Take 400 Units by mouth daily.   Yes Historical Provider, MD  Wheat Dextrin (BENEFIBER) CHEW Chew 1 tablet by mouth daily as needed.   Yes Historical Provider, MD      Review of Systems  Constitutional: Positive for fatigue. Negative for appetite change.  HENT: Positive for rhinorrhea. Negative for congestion and sore throat.   Eyes: Negative.   Respiratory: Positive for shortness of breath (at times).  Negative for cough, chest tightness and wheezing.   Cardiovascular: Negative for chest pain, palpitations and leg swelling.  Gastrointestinal: Positive for abdominal distention. Negative for nausea and abdominal pain.  Endocrine: Negative.   Genitourinary: Positive for decreased urine volume. Negative for dysuria and hematuria.  Musculoskeletal: Positive for back pain (due to left nephrostomy tube). Negative for neck pain.  Skin: Negative.   Allergic/Immunologic: Negative.   Neurological: Positive for light-headedness (intermittently) and headaches (daily). Negative for dizziness and weakness.  Hematological: Negative for adenopathy. Bruises/bleeds easily.  Psychiatric/Behavioral: Negative for sleep disturbance (sleeping on 1 pillow) and dysphoric mood. The patient is not nervous/anxious.        Objective:   Physical Exam  Constitutional: [name] oriented to person, place, and time. He appears well-developed and  well-nourished.  HENT:  Head: Normocephalic and atraumatic.  Eyes: Conjunctivae are normal. Pupils are equal, round, and reactive to light.  Neck: Normal range of motion. Neck supple.  Cardiovascular: Regular rhythm.  Tachycardia present.   Pulmonary/Chest: He has no wheezes. He has no rales.  Abdominal: Soft. He exhibits distension. There is no tenderness.  Musculoskeletal: He exhibits no edema or tenderness.  Neurological: [name] alert and oriented to person, place, and time.  Skin: Skin is warm and dry.  Psychiatric: He has a normal mood and affect. His behavior is normal. Thought content normal.  Nursing note and vitals reviewed.   BP 87/65 mmHg  Pulse 94  Resp 20  Ht 6' (1.829 m)  Wt 270 lb (122.471 kg)  BMI 36.61 kg/m2  SpO2 97%       Assessment & Plan:  1: Chronic heart failure with reduced heart function- Patient presents with fatigue upon exertion along with shortness of breath at times. Did endorse feeling short of breath upon walking into the office today  but once he sat down for a few minutes, his breathing normalized. Does consistently walk with a walker or a cane. Hasn't been weighing himself at home but says that he has scales that just need batteries. Discussed the importance of weighing himself every morning and calling for an overnight weight gain of >2 pounds or a weekly weight gain of >5 pounds. He says that he will get the batteries put in it today. [name] not adding salt to his food but is unsure of how much salt is in what gets prepared for him. He lives with a friend who does most of the cooking and he was encouraged to speak with his friend regarding the sodium content. Discussed the importance of following a 2000mg  sodium diet and written information was given to him about this. Does have support socks that he wears on a daily basis. Has a home health nurse that comes every other day. [facility] PharmD went in and reviewed medications with the patient. Patient is on multiple diuretics along with potassium/spironolactone. Will get a BMP today to check renal function and potassium level. 2: Hypotension- Patient's blood pressure is low and he says that when the nurse checks it, it tends to be in the low 100's. Does intermittently get lightheaded. Patient's HF treatment will be greatly limited due to his blood pressure. Sees his PCP on 08/23/15. 3: Atrial fibrillation- Currently rate controlled at this time. Taking eliquis, amiodarone and carvedilol. Sees cardiology on 08/15/15. 4: Left hydronephrosis- Continues to have the left nephrostomy tube in place. He says that he has multiple stones which prompted the need for the nephrostomy tube. Is following up with urology on 08/10/15.  Return here in 1 month or sooner for any questions/problems before then.

## 2015-08-08 NOTE — Patient Instructions (Signed)
Begin weighing daily and call for an overnight weight gain of > 2 pounds or a weekly weight gain of >5 pounds. 

## 2015-08-09 ENCOUNTER — Telehealth: Payer: Self-pay | Admitting: Family

## 2015-08-09 DIAGNOSIS — I959 Hypotension, unspecified: Secondary | ICD-10-CM | POA: Insufficient documentation

## 2015-08-09 NOTE — Telephone Encounter (Signed)
Left message on voicemail stating that his lab work was stable and he was to continue taking his medications like he's been doing. Discussed lab work with his cardiologist, Dr. Rockey Situ, who says that patient needs to be run on the dry side to hopefully prevent rehospitalization. [name] to continue taking metolazone 2.5mg  daily, furosemide 80mg  BID, spironolactone 25mg  BID and OTC potassium BID.

## 2015-08-10 ENCOUNTER — Ambulatory Visit: Payer: MEDICAID | Admitting: Urology

## 2015-08-15 ENCOUNTER — Encounter: Payer: Self-pay | Admitting: Cardiology

## 2015-08-15 ENCOUNTER — Ambulatory Visit (INDEPENDENT_AMBULATORY_CARE_PROVIDER_SITE_OTHER): Payer: BLUE CROSS/BLUE SHIELD | Admitting: Cardiology

## 2015-08-15 ENCOUNTER — Encounter (INDEPENDENT_AMBULATORY_CARE_PROVIDER_SITE_OTHER): Payer: Self-pay

## 2015-08-15 VITALS — BP 88/68 | HR 85 | Ht 72.0 in | Wt 270.5 lb

## 2015-08-15 DIAGNOSIS — I272 Other secondary pulmonary hypertension: Secondary | ICD-10-CM | POA: Diagnosis not present

## 2015-08-15 DIAGNOSIS — I5022 Chronic systolic (congestive) heart failure: Secondary | ICD-10-CM | POA: Diagnosis not present

## 2015-08-15 DIAGNOSIS — I340 Nonrheumatic mitral (valve) insufficiency: Secondary | ICD-10-CM

## 2015-08-15 DIAGNOSIS — I480 Paroxysmal atrial fibrillation: Secondary | ICD-10-CM

## 2015-08-15 DIAGNOSIS — E785 Hyperlipidemia, unspecified: Secondary | ICD-10-CM

## 2015-08-15 DIAGNOSIS — I10 Essential (primary) hypertension: Secondary | ICD-10-CM

## 2015-08-15 DIAGNOSIS — I440 Atrioventricular block, first degree: Secondary | ICD-10-CM

## 2015-08-15 DIAGNOSIS — G4733 Obstructive sleep apnea (adult) (pediatric): Secondary | ICD-10-CM | POA: Diagnosis not present

## 2015-08-15 NOTE — Patient Instructions (Signed)
Medication Instructions:  Your physician recommends that you continue on your current medications as directed. Please refer to the Current Medication list given to you today.   Labwork: BMP and Lipid profile with next lab draw.   Testing/Procedures: None ordered  Follow-Up: Your physician recommends that you schedule a follow-up appointment in: with Dr. Yvone Neu.  Date & Time: ______________________________________________________  Dennis Bast have been referred to Dr. Caryl Comes for evaluation.   Date & Time: ______________________________________________________    Any Other Special Instructions Will Be Listed Below (If Applicable).     If you need a refill on your cardiac medications before your next appointment, please call your pharmacy.

## 2015-08-15 NOTE — Progress Notes (Signed)
Cardiology Office Note   Date:  08/15/2015   ID:  [name], DOB 14-Mar-1951, MRN [id number]  Referring Doctor:  Elisabeth Cara, NP   Cardiologist:   Wende Bushy, MD   Reason for consultation:  Chief Complaint  Patient presents with  . other    S/p cardiac cath pt needs cardiac clearance for kidney stone removal. Meds reviewed verbally with pt.      History of Present Illness: [name] is a 65 y.o. male who presents for atrial fibrillation, congestive heart failure  In terms of atrial fibrillation, as far as he is concerned, heart rate is controlled. He does not have any symptoms of palpitations from this. He continues to take the anticoagulant. He has no significant bleeding issues.  In terms of congestive heart failure, he has been aware of a weakened heart for many years now. He was told that he had a heart attack but he does not recall an event in the past where he had significant chest pain. However he is aware that his heart has been weak for quite some time now. He has been more privileges with taking his medications and is addressing lifestyle issues -- dietary changes, sodium restriction.He feels that his weight has been stable at home. Blood pressure is in the [id number] systolic.  Patient is likely class III in terms of functional capacity for CHF. He denies chest pain. No shortness of breath at rest. No palpitations. No loss of consciousness. No syncope.     ROS:  Please see the history of present illness. Aside from mentioned under HPI, all other systems are reviewed and negative.     Past Medical History  Diagnosis Date  . Chronic combined systolic (congestive) and diastolic (congestive) heart failure (HCC)     a. EF 25% by cath in 2013 b. Echo in 07/2015 showing EF of 15-20%, moderate MR, moderate Pulm HTN, severely dilated IVC  . Sleep apnea   . Diabetes mellitus type 2, uncontrolled ([place])     a. A1c 11.0 in 07/2015.  [name] Renal disorder     kidney stone  .  Hypertension   . New onset atrial fibrillation ([place])     a. diagnosed in 07/2015 b. started on Eliquis  . CKD (chronic kidney disease) stage 3, GFR 30-59 ml/min   . Coronary artery disease     Past Surgical History  Procedure Laterality Date  . Kidney surgery    . Coronary angioplasty with stent placement    . Cardiac catheterization N/A 07/25/2015    Procedure: Right/Left Heart Cath and Coronary Angiography;  Surgeon: [place] Hampshire, MD;  Location: [facility];  Service: Cardiovascular;  Laterality: N/A;  . Cardiac catheterization      [place]  . Cardiac catheterization      [facility] [place]     reports that he has never smoked. He has never used smokeless tobacco. He reports that he drinks alcohol. He reports that he does not use illicit drugs.   family history includes Heart attack in his brother; Heart failure in his father.   Current Outpatient Prescriptions  Medication Sig Dispense Refill  . ALPRAZolam (XANAX) 0.25 MG tablet Take 1 tablet (0.25 mg total) by mouth every 8 (eight) hours as needed for anxiety. 30 tablet 0  . amiodarone (PACERONE) 200 MG tablet Take 1 tablet (200 mg total) by mouth daily. 30 tablet 0  . apixaban (ELIQUIS) 5 MG TABS tablet Take 1 tablet (5 mg total)  by mouth 2 (two) times daily. 60 tablet 0  . atorvastatin (LIPITOR) 40 MG tablet Take 1 tablet (40 mg total) by mouth daily at 6 PM. 30 tablet 0  . b complex vitamins tablet Take 1 tablet by mouth daily.    . carvedilol (COREG) 3.125 MG tablet Take 1 tablet (3.125 mg total) by mouth 2 (two) times daily with a meal. 60 tablet 0  . cholecalciferol (VITAMIN D) 1000 units tablet Take 2,000 Units by mouth daily.    . Coenzyme Q10 (CO Q 10 PO) Take 400 mg by mouth daily.    . Cranberry 500 MG CAPS Take 1,000 mg by mouth daily.    . furosemide (LASIX) 80 MG tablet Take 1 tablet (80 mg total) by mouth 2 (two) times daily. 60 tablet 0  . glipiZIDE (GLUCOTROL) 10 MG tablet Take 20-30 mg by  mouth 2 (two) times daily. Pt takes three tablets in the morning and two tablets at bedtime.    [name] Hawthorne Berry 550 MG CAPS Take 550 mg by mouth daily.    . metFORMIN (GLUCOPHAGE) 500 MG tablet Take 1,000 mg by mouth 2 (two) times daily with a meal.    . metolazone (ZAROXOLYN) 2.5 MG tablet Take 1 tablet (2.5 mg total) by mouth daily. 30 tablet 0  . multivitamin-lutein (OCUVITE-LUTEIN) CAPS capsule Take 1 capsule by mouth 2 (two) times daily.    Jonna Coup Leaf Extract 250 MG CAPS Take 250 mg by mouth 2 (two) times daily.    [name] OVER THE COUNTER MEDICATION Take 3 capsules by mouth 2 (two) times daily. Pt takes beet root.    [name] OVER THE COUNTER MEDICATION Take 1 capsule by mouth daily. Pt takes caprylic acid.    [name] oxyCODONE-acetaminophen (PERCOCET/ROXICET) 5-325 MG tablet Take 1 tablet by mouth every 4 (four) hours as needed for moderate pain. 30 tablet 0  . polyethylene glycol (MIRALAX / GLYCOLAX) packet Take 17 g by mouth daily as needed for mild constipation.    . potassium phosphate, monobasic, (K-PHOS ORIGINAL) 500 MG tablet Take 500 mg by mouth 2 (two) times daily with a meal.    . spironolactone (ALDACTONE) 25 MG tablet Take 25 mg by mouth 2 (two) times daily.     . TURMERIC CURCUMIN PO Take 1 capsule by mouth daily.    . vitamin C (ASCORBIC ACID) 500 MG tablet Take 1,000 mg by mouth daily.    . vitamin E 400 UNIT capsule Take 400 Units by mouth daily.    . Wheat Dextrin (BENEFIBER) CHEW Chew 1 tablet by mouth daily as needed.     No current facility-administered medications for this visit.    Allergies: Review of patient's allergies indicates no known allergies.    PHYSICAL EXAM: VS:  BP 88/68 mmHg  Pulse 85  Ht 6' (1.829 m)  Wt 270 lb 8 oz (122.698 kg)  BMI 36.68 kg/m2 , Body mass index is 36.68 kg/(m^2). Wt Readings from Last 3 Encounters:  08/15/15 270 lb 8 oz (122.698 kg)  08/08/15 270 lb (122.471 kg)  08/01/15 294 lb 8 oz (133.584 kg)    GENERAL:  well developed, well  nourished,  obese, not in acute distress HEENT: normocephalic, pink conjunctivae, anicteric sclerae, no xanthelasma, normal dentition, oropharynx clear NECK:  no neck vein engorgement, JVP normal, no hepatojugular reflux, carotid upstroke brisk and symmetric, no bruit, no thyromegaly, no lymphadenopathy LUNGS:  good respiratory effort, clear to auscultation bilaterally CV:  PMI not displaced, no thrills,  no lifts, S2 within normal limits, no palpable S3 or S4, no murmurs, no rubs, no gallops ABD:  Soft, nontender, distended, normoactive bowel sounds, no abdominal aortic bruit, no hepatomegaly, no splenomegaly MS: nontender back, no kyphosis, no scoliosis, no joint deformities EXT:  2+ DP/PT pulses, +1 edema, no varicosities, no cyanosis, no clubbing SKIN: warm, nondiaphoretic, normal turgor, no ulcers NEUROPSYCH: alert, oriented to person, place, and time, sensory/motor grossly intact, normal mood, appropriate affect  Recent Labs: 07/09/2015: B Natriuretic Peptide 1891.0* 07/29/2015: Magnesium 2.0 07/31/2015: Hemoglobin 11.6*; Platelets 183 08/08/2015: BUN 46*; Creatinine, Ser 1.74*; Potassium 3.7; Sodium 135   Lipid Panel    Component Value Date/Time   CHOL 153 07/09/2015 0455   TRIG 168* 07/09/2015 0455   HDL 34* 07/09/2015 0455   CHOLHDL 4.5 07/09/2015 0455   VLDL 34 07/09/2015 0455   LDLCALC 85 07/09/2015 0455     Other studies Reviewed:  EKG:  EKG  ordered today.08/15/2015  The ekg ordered today was personally reviewed by me and it reveals sinus rhythm first degree AV block. 85 BPM. LAFB. Cannot rule out anterior infarct.  Additional studies/ records that were reviewed personally reviewed by me today include:  Echocardiogram 07/09/2015: LV moderately dilated. Mild concentric hypertrophy. EF 15-20%. Possible Akinesis of anterior septal, anterior, anterolateral myocardium. Moderate MR. Left atrium moderately dilated. RV moderately dilated. Systolic function moderately reduced. R a  mildly dilated. IVC severely dilated.  Right and left heart cath 07/25/2015, Dr. Fletcher Anon: Proximal LAD to mid LAD lesion, 60% stenosed. Previously treated with a stent. Distal RCA 60% stenosed. Ostial second diagonal to second diagonal, 70% stenosed. Moderately elevated filling pressures with moderate pulmonary hypertension. Hemodynamic features suggestive of restrictive physiology Moderate two-vessel CAD. Patent proximal LAD stent with 60% in-stent restenosis. LAD and RCA moderately calcified. PA pressure: 56/34 with a mean of 42, LV pressure: 95/15 with left ventricular end-diastolic pressure of 26. Cardiac output was 4.86 with a cardiac index of 1.9.   ASSESSMENT AND PLAN:  congestive heart failure, systolic, chronic CHF, not in acute decompensation, EF 15-20% Ischemic cardiomyopathy Biventricular congestive heart failure Appears euvolemic. Weight has been stable. Commended on following sodium restriction, lifestyle changes. Blood pressure is borderline. Patient otherwise asymptomatic. We'll be unable to up titrate medications at this time. But will continue current dose of carvedilol. Continue Aldactone for now. Will not be able to start entresto or even acei/arb due to relatively low blood pressure .  Continue furosemide and metolazone for now. Last BMP was improved compared to BMP from hospital. Patient follows up with congestive heart failure clinic. Recommend to check BMP and fasting lipid panel in 1-2 weeks. Referred to electrophysiology for evaluation for ICD for primary prevention of SCD   Pulmonary hypertension, pulmonary venous, secondary to CHF Likely improve with diuresis. Continue to monitor  Coronary artery disease, moderate two-vessel CAD, no high-grade stenosis on last heart catheterization 07/25/2015.  No further investigation at this point prior to planned urologic procedure. No chest pain. Consider aspirin 81 mg by mouth daily after urologic procedure. Continue statin  therapy. LDL goal less than 70.  Atrial fibrillation, likely paroxysmal, on antiarrhythmic medication, on anticoagulation, currently in sinus rhythm Continue antiarrhythmic medication for now. Continue low-dose carvedilol.  Patient tolerating anticoagulation. Continue eliquis for now. May hold 24 hours prior to planned urologic procedure. Resume as soon as okay with urology.   Mitral regurgitation, moderate Likely related to CHF, dilated cardiomyopathy. Should improve with better volume status and blood pressure control. Continue to monitor  First-degree AV block  on low-dose beta blockade. Continue to monitor  Hypertension  continue blood pressure log continue current meds   Hyperlipidemia  continue statin therapy. LDL goal less than 70   Obesity Body mass index is 36.68 kg/(m^2).[name] Recommend aggressive weight loss through diet and increased physical activity.   Sleep apnea Follow-up with PCP regarding CPAP    Current medicines are reviewed at length with the patient today.  The patient does not have concerns regarding medicines.  Labs/ tests ordered today include:  Orders Placed This Encounter  Procedures  . Basic Metabolic Panel (BMET)  . Lipid Profile  . Ambulatory referral to Cardiac Electrophysiology  . EKG 12-Lead    I had a lengthy and detailed discussion with the patient regarding diagnoses, prognosis, diagnostic options, treatment options, and side effects of medications.   I counseled the patient on importance of lifestyle modification including heart healthy diet, regular physical activity.  Disposition:   FU with undersigned In one month  I spent at least 40 minutes with the patient today and more than 50% of the time was spent counseling the patient and coordinating care. Patient is high risk due to multiple comorbidities.    Signed, Wende Bushy, MD  08/15/2015 4:20 PM    [facility]

## 2015-08-16 ENCOUNTER — Encounter: Payer: Self-pay | Admitting: Urology

## 2015-08-16 ENCOUNTER — Ambulatory Visit (INDEPENDENT_AMBULATORY_CARE_PROVIDER_SITE_OTHER): Payer: BLUE CROSS/BLUE SHIELD | Admitting: Urology

## 2015-08-16 VITALS — BP 99/69 | HR 90 | Ht 72.0 in | Wt 269.4 lb

## 2015-08-16 DIAGNOSIS — N201 Calculus of ureter: Secondary | ICD-10-CM

## 2015-08-16 DIAGNOSIS — N1330 Unspecified hydronephrosis: Secondary | ICD-10-CM | POA: Diagnosis not present

## 2015-08-16 DIAGNOSIS — I5043 Acute on chronic combined systolic (congestive) and diastolic (congestive) heart failure: Secondary | ICD-10-CM | POA: Diagnosis not present

## 2015-08-16 DIAGNOSIS — N261 Atrophy of kidney (terminal): Secondary | ICD-10-CM

## 2015-08-16 NOTE — Progress Notes (Signed)
08/16/2015 5:18 PM   [name] 22-Jun-1950 [id number]  Referring provider: Elisabeth Cara, NP [place], [place] [address]  Chief Complaint  Patient presents with  . New Patient (Initial Visit)    nephrostomy tube    HPI: 65 year old male with recent prolonged hospital administration for CHF (EF 15-20%) and A. Fib.  He ultimately was diuresed and appropriate for discharge. During the hospital admission, he developed some left flank pain and underwent a CT scan. This revealed extensive left ureteral stone burden with chronic left kidney obstruction noted. He underwent left percutaneous nephrostomy tube placement in interventional radiology prior to discharge.  CT stone protocol shows numerous left ureteral calcifications including a 2.2 cm, 2.5 cm left proximal stone along with a 1 cm left distal stone. The left kidney is atrophic with hydronephrosis.   He does have baseline CKD, creatinine at baseline. No fevers or chills. No dysuria.   He does have an extensive personal history of kidney stones. He underwent ESWL 2 and ureteroscopy in the past but has not seen a urologist in over 10 years. He's had intermittent flank pain for numerous years but never sought intervention.  He returns today to discuss definitive management of his stones.  His nephrostomy tube has been draining well with intermittent hematuria with activity. Fevers or chills. Mild flank discomfort from the tube.  He has seen his cardiologist, Dr. Yvone Neu as outpatient follow-up already and been cleared for surgery. He'll be able to hold his anticoagulation for 24 hours prior to the procedure (Eliquis).   PMH: Past Medical History  Diagnosis Date  . Chronic combined systolic (congestive) and diastolic (congestive) heart failure (HCC)     a. EF 25% by cath in 2013 b. Echo in 07/2015 showing EF of 15-20%, moderate MR, moderate Pulm HTN, severely dilated IVC  . Sleep apnea   . Diabetes mellitus type  2, uncontrolled ([place])     a. A1c 11.0 in 07/2015.  [name] Renal disorder     kidney stone  . Hypertension   . New onset atrial fibrillation ([place])     a. diagnosed in 07/2015 b. started on Eliquis  . CKD (chronic kidney disease) stage 3, GFR 30-59 ml/min   . Coronary artery disease   . Paroxysmal atrial fibrillation [facility])     Surgical History: Past Surgical History  Procedure Laterality Date  . Kidney surgery    . Coronary angioplasty with stent placement    . Cardiac catheterization N/A 07/25/2015    Procedure: Right/Left Heart Cath and Coronary Angiography;  Surgeon: Wellington Hampshire, MD;  Location: [place] [facility];  Service: Cardiovascular;  Laterality: N/A;  . Cardiac catheterization      [place]  . Cardiac catheterization      [facility] [place]    Home Medications:    Medication List       This list is accurate as of: 08/16/15  5:18 PM.  Always use your most recent med list.               ALPRAZolam 0.25 MG tablet  Commonly known as:  XANAX  Take 1 tablet (0.25 mg total) by mouth every 8 (eight) hours as needed for anxiety.     amiodarone 200 MG tablet  Commonly known as:  PACERONE  Take 1 tablet (200 mg total) by mouth daily.     apixaban 5 MG Tabs tablet  Commonly known as:  ELIQUIS  Take 1 tablet (5 mg total) by  mouth 2 (two) times daily.     atorvastatin 40 MG tablet  Commonly known as:  LIPITOR  Take 1 tablet (40 mg total) by mouth daily at 6 PM.     b complex vitamins tablet  Take 1 tablet by mouth daily.     BENEFIBER Chew  Chew 1 tablet by mouth daily as needed.     carvedilol 3.125 MG tablet  Commonly known as:  COREG  Take 1 tablet (3.125 mg total) by mouth 2 (two) times daily with a meal.     cholecalciferol 1000 units tablet  Commonly known as:  VITAMIN D  Take 2,000 Units by mouth daily.     CO Q 10 PO  Take 400 mg by mouth daily.     Cranberry 500 MG Caps  Take 1,000 mg by mouth daily.     furosemide 80 MG tablet    Commonly known as:  LASIX  Take 1 tablet (80 mg total) by mouth 2 (two) times daily.     glipiZIDE 10 MG tablet  Commonly known as:  GLUCOTROL  Take 20-30 mg by mouth 2 (two) times daily. Pt takes three tablets in the morning and two tablets at bedtime.     [place] Berry 550 MG Caps  Take 550 mg by mouth daily.     metFORMIN 500 MG tablet  Commonly known as:  GLUCOPHAGE  Take 1,000 mg by mouth 2 (two) times daily with a meal.     metolazone 2.5 MG tablet  Commonly known as:  ZAROXOLYN  Take 1 tablet (2.5 mg total) by mouth daily.     multivitamin-lutein Caps capsule  Take 1 capsule by mouth 2 (two) times daily.     Olive Leaf Extract 250 MG Caps  Take 250 mg by mouth 2 (two) times daily.     OVER THE COUNTER MEDICATION  Take 3 capsules by mouth 2 (two) times daily. Pt takes beet root.     OVER THE COUNTER MEDICATION  Take 1 capsule by mouth daily. Pt takes caprylic acid.     oxyCODONE-acetaminophen 5-325 MG tablet  Commonly known as:  PERCOCET/ROXICET  Take 1 tablet by mouth every 4 (four) hours as needed for moderate pain.     polyethylene glycol packet  Commonly known as:  MIRALAX / GLYCOLAX  Take 17 g by mouth daily as needed for mild constipation.     potassium phosphate (monobasic) 500 MG tablet  Commonly known as:  K-PHOS ORIGINAL  Take 500 mg by mouth 2 (two) times daily with a meal.     spironolactone 25 MG tablet  Commonly known as:  ALDACTONE  Take 25 mg by mouth 2 (two) times daily.     TURMERIC CURCUMIN PO  Take 1 capsule by mouth daily.     vitamin C 500 MG tablet  Commonly known as:  ASCORBIC ACID  Take 1,000 mg by mouth daily.     vitamin E 400 UNIT capsule  Take 400 Units by mouth daily.        Allergies: No Known Allergies  Family History: Family History  Problem Relation Age of Onset  . Heart failure Father   . Heart attack Brother     Social History:  reports that he has never smoked. He has never used smokeless tobacco. He  reports that he drinks alcohol. He reports that he does not use illicit drugs.  ROS: UROLOGY Frequent Urination?: Yes Hard to postpone urination?: No Burning/pain with urination?: Yes  Get up at night to urinate?: Yes Leakage of urine?: No Urine stream starts and stops?: Yes Trouble starting stream?: No Do you have to strain to urinate?: No Blood in urine?: Yes Urinary tract infection?: No Sexually transmitted disease?: No Injury to kidneys or bladder?: No Painful intercourse?: No Weak stream?: No Erection problems?: Yes Penile pain?: No  Gastrointestinal Nausea?: No Vomiting?: No Indigestion/heartburn?: No Diarrhea?: No Constipation?: No  Constitutional Fever: No Night sweats?: No Weight loss?: No Fatigue?: Yes  Skin Skin rash/lesions?: Yes Itching?: Yes  Eyes Blurred vision?: Yes Double vision?: Yes  Ears/Nose/Throat Sore throat?: No Sinus problems?: No  Hematologic/Lymphatic Swollen glands?: No Easy bruising?: Yes  Cardiovascular Leg swelling?: Yes Chest pain?: No  Respiratory Cough?: Yes Shortness of breath?: Yes  Endocrine Excessive thirst?: Yes  Musculoskeletal Back pain?: Yes Joint pain?: No  Neurological Headaches?: Yes Dizziness?: Yes  Psychologic Depression?: Yes Anxiety?: Yes  Physical Exam: BP 99/69 mmHg  Pulse 90  Ht 6' (1.829 m)  Wt 269 lb 6.4 oz (122.199 kg)  BMI 36.53 kg/m2  Constitutional:  Alert and oriented, No acute distress. HEENT: [place] AT, moist mucus membranes.  Trachea midline, no masses. Cardiovascular: No clubbing, cyanosis.  Irregular rhythm. Respiratory: Normal respiratory effort, no increased work of breathing.  CTAB. GI: Abdomen is soft, nontender, nondistended, no abdominal masses.  Obese. GU: No CVA tenderness. Left percutaneous nephrostomy tube draining light pink urine. Left nephrostomy tube site with small dried adherent clot, granulation tissue around tube.   Skin: No rashes, bruises or suspicious  lesions. Neurologic: Grossly intact, no focal deficits, moving all 4 extremities. Psychiatric: Normal mood and affect.  Laboratory Data: Lab Results  Component Value Date   WBC 6.6 07/31/2015   HGB 11.6* 07/31/2015   HCT 34.3* 07/31/2015   MCV 93.4 07/31/2015   PLT 183 07/31/2015    Lab Results  Component Value Date   CREATININE 1.74* 08/08/2015    Lab Results  Component Value Date   HGBA1C 11.0* 07/08/2015    Urinalysis Pending, UCx from left nephrostomy tube collected  Pertinent Imaging: Study Result     CLINICAL DATA: Left flank pain. History kidney stones.  EXAM: CT ABDOMEN AND PELVIS WITHOUT CONTRAST  TECHNIQUE: Multidetector CT imaging of the abdomen and pelvis was performed following the standard protocol without IV contrast.  COMPARISON: Renal ultrasound and abdominal radiographs 1 day prior.  FINDINGS: Lower chest: Heart mildly enlarged. Coronary artery calcifications are seen. There is a small left pleural effusion.  Liver: Enlarged with diffusely decreased density consistent with steatosis.  Hepatobiliary: Gallbladder physiologically distended, no calcified stone. No biliary dilatation.  Pancreas: No ductal dilatation or inflammation.  Spleen: Normal in size measuring 11.6 cm greatest dimension.  Adrenal glands: No nodule.  Kidneys: There are 2 large stones in the mid proximal left ureter. More proximal stone measures 1.3 x 1.1 x 2.2 cm, with an adjacent stone measuring 2.5 x 1.6 x 1.4 cm. Moderate proximal hydroureteronephrosis with increased density of the urine in the renal collecting system, suggestive of blood. Ureter distal to this is decompressed, however is an additional 0.9 x 1.0 x 0.6 cm stone in the left distal ureter just proximal to the ureterovesicular junction. There is thinning of the left renal parenchyma. Small nonobstructing stone in the lower left kidney. Cyst in the interpolar left kidney measures 3.9 cm.  No stones or obstructive uropathy of the right kidney. Right ureter is decompressed.  Stomach/Bowel: Limited bowel assessment secondary to lack of enteric contrast, intra-abdominal ascites,  and soft tissue attenuation from body habitus. Stomach physiologically distended with ingested contents. There are no dilated or thickened small bowel loops. Small volume of stool throughout the colon without colonic wall thickening. The appendix is not confidently identified.  Vascular/Lymphatic: No retroperitoneal adenopathy. Abdominal aorta is normal in caliber moderate aortic atherosclerosis without aneurysm.  Reproductive: Prostatic calcifications.  Bladder: Decompressed, probable intraluminal blood.  Other: Moderate volume of intra-abdominal and pelvic ascites. Free fluid in the mesenteric, pelvis, both pericolic gutters in both upper quadrants. Mild mesenteric and whole body wall edema. No free air. No loculated intra-abdominal fluid collection.  Musculoskeletal: There are no acute or suspicious osseous abnormalities. Facet arthropathy in the lower lumbar spine.  IMPRESSION: 1. Left hydroureteronephrosis with 2 large stones in the mid proximal ureter measuring 2.2 and 2.5 cm. Ureter distal to this is decompressed, however there is an additional 1 cm stone in the distal left ureter, just proximal to the ureterovesicular junction. Increased density of the urine in the bladder and left renal collecting system consistent with hemorrhage. 2. Moderate volume intra-abdominal ascites. There is hepatic steatosis. 3. Atherosclerosis, including coronary artery calcifications. Cardiomegaly and small left effusion in the included lung bases.   Electronically Signed  By: Jeb Levering M.D.  On: 07/25/2015 19:45     Assessment & Plan:    1. Left ureteral stone CT scan reviewed today again. Significant left ureteral calcification occupying large portion of ureter. Again, we  discussed various options including continued management with percutaneous nephrostomy tube versus intervention. He would like to have the stones treated. There does appear to be some left renal parenchyma that could be salvaged despite atrophy. Next  We discussed surgical plane today extensively. Given his history of anticoagulation, obesity, and comorbidities, I prefer not to treat the stones in antegrade fashion. We will attempt to do staged ureteroscopy to clear his ureter. I have previously spoken with interventional radiology who has agreed to attempt to place an antegrade wire/nephroureteral stent on the day of the procedure as a safety as this is more likely to pass an antegrade fashion as a posterior retrograde. Patient is aware that he will likely require more than one procedure. Risks of surgery including risk of bleeding, infection, damage to surrounding structures including his ureter were discussed in detail. Additionally, he is somewhat high risk for general anesthesia and we have already obtained cardiac clearance.  Preop urine culture from left nephrostomy. We will obtain a voided urine culture from previous and testing.  His questions were answered today.  - CULTURE, URINE COMPREHENSIVE - IR URETERAL STENT PLACEMENT EXISTING ACCESS LEFT; Future  2. Hydronephrosis, left Obstructing ureteral stones  3. Left renal atrophy As above  4. Acute on chronic combined systolic and diastolic CHF (congestive heart failure) ([place]) Comorbidity   Schedule surgery   Hollice Espy, MD  [facility] [address], [place], [place] [address] [phone]  I spent 25 min with this patient of which greater than 50% was spent in counseling and coordination of care with the patient.

## 2015-08-18 ENCOUNTER — Telehealth: Payer: Self-pay | Admitting: Internal Medicine

## 2015-08-18 ENCOUNTER — Other Ambulatory Visit: Payer: Self-pay | Admitting: Radiology

## 2015-08-18 ENCOUNTER — Telehealth: Payer: Self-pay | Admitting: Radiology

## 2015-08-18 DIAGNOSIS — N201 Calculus of ureter: Secondary | ICD-10-CM

## 2015-08-18 NOTE — Telephone Encounter (Signed)
[name] the pt's Case manager called to let the office know that pt is enrolled in [facility] BS insurance . We are to call [name] for questions. Case manager is aware that pt has an appointment with Dr. Caryl Comes on Monday 08/22/15 at 9:45 AM. She states pt has the insurance card with him to bring to the appointment.

## 2015-08-18 NOTE — Telephone Encounter (Signed)
Notified pt of surgery scheduled 09/07/15, pre-admit testing appt on 08/24/15 @10 :30 and to call day prior to surgery for arrival time to SDS. Advised pt to hold Eliquis 24 hrs prior to surgery per Dr Tora Kindred instruction. Pt voices understanding.

## 2015-08-18 NOTE — Telephone Encounter (Signed)
New message     FYI Pt is enrolled in [employer] case management program.  Calling to let us know this

## 2015-08-19 ENCOUNTER — Ambulatory Visit: Payer: BLUE CROSS/BLUE SHIELD | Admitting: Internal Medicine

## 2015-08-22 ENCOUNTER — Encounter: Payer: Self-pay | Admitting: Internal Medicine

## 2015-08-22 ENCOUNTER — Ambulatory Visit (INDEPENDENT_AMBULATORY_CARE_PROVIDER_SITE_OTHER): Payer: BLUE CROSS/BLUE SHIELD | Admitting: Internal Medicine

## 2015-08-22 VITALS — BP 126/83 | HR 90 | Ht 72.0 in | Wt 262.0 lb

## 2015-08-22 DIAGNOSIS — I480 Paroxysmal atrial fibrillation: Secondary | ICD-10-CM | POA: Diagnosis not present

## 2015-08-22 DIAGNOSIS — I5022 Chronic systolic (congestive) heart failure: Secondary | ICD-10-CM | POA: Diagnosis not present

## 2015-08-22 LAB — CULTURE, URINE COMPREHENSIVE

## 2015-08-22 MED ORDER — LOSARTAN POTASSIUM 25 MG PO TABS: 25.0000 mg | ORAL_TABLET | Freq: Every day | ORAL | Status: DC

## 2015-08-22 MED ORDER — CARVEDILOL 6.25 MG PO TABS: 6.2500 mg | ORAL_TABLET | Freq: Two times a day (BID) | ORAL | Status: DC

## 2015-08-22 NOTE — Progress Notes (Signed)
ELECTROPHYSIOLOGY CONSULT NOTE  Patient ID: [name], MRN: [id number], DOB/AGE: 1951-03-27 65 y.o. Admit date: (Not on file) Date of Consult: 08/22/2015  Primary Physician: Elisabeth Cara, NP Primary Cardiologist: AI Consulting Physician AI  Chief Complaint: ICD   HPI [name] is a 65 y.o. male  Seen for consideration of an ICD.  I had seen him in hospital 2/17 where he had been admitted for acute on chronic congestive heart failure.  to [facility]. Echocardiogram demonstrated ejection fraction of 15% with a congestive heart failure symptoms. He required inotropic therapy. He also had atrial fibrillation and was started on amiodarone and apixaban.  Cath >> Dist RCA lesion, 60% stenosed.  Prox LAD to Mid LAD lesion, 60% stenosed. The lesion was previously treated with a stent (unknown type) greater than two years ago.  Ost 2nd Diag to 2nd Diag lesion, 70% stenosed.  1. Moderately elevated filling pressures with moderate pulmonary hypertension. Mildly reduced cardiac output. Hemodynamic features are suggestive of restrictive physiology. 2. Moderate two-vessel coronary artery disease. Patent proximal LAD stent with 60% in-stent restenosis. The LAD and RCA are moderately calcified  On arrival to the hospital he was on Aldactone and hydrochlorothiazide and low-dose beta-blockade; he was not on ace/ARB. [name] modest renal insufficiency with a creatinine of 1.48 ECG 3/17 demonstrated sinus rhythm first degree AV block and a QRS duration about 120 ms     Past Medical History  Diagnosis Date  . Chronic combined systolic (congestive) and diastolic (congestive) heart failure (HCC)     a. EF 25% by cath in 2013 b. Echo in 07/2015 showing EF of 15-20%, moderate MR, moderate Pulm HTN, severely dilated IVC  . Sleep apnea   . Diabetes mellitus type 2, uncontrolled ([place])     a. A1c 11.0 in 07/2015.  [name] Renal disorder     kidney stone  . Hypertension   . New onset atrial fibrillation  ([place])     a. diagnosed in 07/2015 b. started on Eliquis  . CKD (chronic kidney disease) stage 3, GFR 30-59 ml/min   . Coronary artery disease   . Paroxysmal atrial fibrillation [facility])       Surgical History:  Past Surgical History  Procedure Laterality Date  . Kidney surgery    . Coronary angioplasty with stent placement    . Cardiac catheterization N/A 07/25/2015    Procedure: Right/Left Heart Cath and Coronary Angiography;  Surgeon: Wellington Hampshire, MD;  Location: [facility];  Service: Cardiovascular;  Laterality: N/A;  . Cardiac catheterization      [place]  . Cardiac catheterization      [facility], [place]     Home Meds: Prior to Admission medications   Medication Sig Start Date End Date Taking? Authorizing Provider  ALPRAZolam (XANAX) 0.25 MG tablet Take 1 tablet (0.25 mg total) by mouth every 8 (eight) hours as needed for anxiety. 08/01/15  Yes Lytle Butte, MD  amiodarone (PACERONE) 200 MG tablet Take 1 tablet (200 mg total) by mouth daily. 08/01/15  Yes Lytle Butte, MD  apixaban (ELIQUIS) 5 MG TABS tablet Take 1 tablet (5 mg total) by mouth 2 (two) times daily. 08/01/15  Yes Lytle Butte, MD  atorvastatin (LIPITOR) 40 MG tablet Take 1 tablet (40 mg total) by mouth daily at 6 PM. 08/01/15  Yes Lytle Butte, MD  b complex vitamins tablet Take 1 tablet by mouth daily.   Yes Historical Provider, MD  carvedilol (COREG)  3.125 MG tablet Take 1 tablet (3.125 mg total) by mouth 2 (two) times daily with a meal. 08/01/15  Yes Lytle Butte, MD  cholecalciferol (VITAMIN D) 1000 units tablet Take 2,000 Units by mouth daily.   Yes Historical Provider, MD  Coenzyme Q10 (CO Q 10 PO) Take 400 mg by mouth daily.   Yes Historical Provider, MD  Cranberry 500 MG CAPS Take 1,000 mg by mouth daily.   Yes Historical Provider, MD  furosemide (LASIX) 80 MG tablet Take 1 tablet (80 mg total) by mouth 2 (two) times daily. 08/01/15  Yes Lytle Butte, MD  glipiZIDE (GLUCOTROL) 10 MG tablet  Take 20-30 mg by mouth 2 (two) times daily. Pt takes three tablets in the morning and two tablets at bedtime.   Yes Historical Provider, MD  Cathrine Muster 550 MG CAPS Take 550 mg by mouth daily.   Yes Historical Provider, MD  metFORMIN (GLUCOPHAGE) 500 MG tablet Take 1,000 mg by mouth 2 (two) times daily with a meal.   Yes Historical Provider, MD  metolazone (ZAROXOLYN) 2.5 MG tablet Take 1 tablet (2.5 mg total) by mouth daily. 08/01/15  Yes Lytle Butte, MD  multivitamin-lutein [facility]) CAPS capsule Take 1 capsule by mouth 2 (two) times daily.   Yes Historical Provider, MD  Olive Leaf Extract 250 MG CAPS Take 250 mg by mouth 2 (two) times daily.   Yes Historical Provider, MD  OVER THE COUNTER MEDICATION Take 3 capsules by mouth 2 (two) times daily. Pt takes beet root.   Yes Historical Provider, MD  OVER THE COUNTER MEDICATION Take 1 capsule by mouth daily. Pt takes caprylic acid.   Yes Historical Provider, MD  oxyCODONE-acetaminophen (PERCOCET/ROXICET) 5-325 MG tablet Take 1 tablet by mouth every 4 (four) hours as needed for moderate pain. 08/01/15  Yes Lytle Butte, MD  polyethylene glycol [facility] / GLYCOLAX) packet Take 17 g by mouth daily as needed for mild constipation.   Yes Historical Provider, MD  potassium phosphate, monobasic, (K-PHOS ORIGINAL) 500 MG tablet Take 500 mg by mouth 2 (two) times daily with a meal.   Yes Historical Provider, MD  spironolactone (ALDACTONE) 25 MG tablet Take 25 mg by mouth 2 (two) times daily.    Yes Historical Provider, MD  TURMERIC CURCUMIN PO Take 1 capsule by mouth daily.   Yes Historical Provider, MD  vitamin C (ASCORBIC ACID) 500 MG tablet Take 1,000 mg by mouth daily.   Yes Historical Provider, MD  vitamin E 400 UNIT capsule Take 400 Units by mouth daily.   Yes Historical Provider, MD  Wheat Dextrin (BENEFIBER) CHEW Chew 1 tablet by mouth daily as needed.   Yes Historical Provider, MD    Allergies: No Known Allergies  Social History    Social History  . Marital Status: Single    Spouse Name: N/A  . Number of Children: N/A  . Years of Education: N/A   Occupational History  . Not on file.   Social History Main Topics  . Smoking status: Never Smoker   . Smokeless tobacco: Never Used  . Alcohol Use: 0.0 oz/week    0 Standard drinks or equivalent per week     Comment: occasionally  . Drug Use: No     Comment: Past  . Sexual Activity: Not on file   Other Topics Concern  . Not on file   Social History Narrative     Family History  Problem Relation Age of Onset  . Heart failure  Father   . Heart attack Brother      ROS:  Please see the history of present illness.     All other systems reviewed and negative.    Physical Exam: Blood pressure 126/83, pulse 90, height 6' (1.829 m), weight 262 lb (118.842 kg). General: Well developed,Morbidly obese   male in no acute distress. Head: Normocephalic, atraumatic, sclera non-icteric, no xanthomas, nares are without discharge. EENT: normal  Lymph Nodes:  none Neck: Negative for carotid bruits. JVD not elevated. Back:without scoliosis kyphosis  Lungs: Clear bilaterally to auscultation without wheezes, rales, or rhonchi. Breathing is unlabored. Heart: RRR with S1 S2. No  murmur . No rubs, or gallops appreciated. Abdomen: Soft, non-tender, non-distended with normoactive bowel sounds. No hepatomegaly. No rebound/guarding. No obvious abdominal masses. Msk:  Strength and tone appear normal for age. Extremities: No clubbing or cyanosis.  2+  edema.  Distal pedal pulses are 2+ and equal bilaterally. Skin: Warm and Dry Neuro: Alert and oriented X 3. CN III-XII intact Grossly normal sensory and motor function . Psych:  Responds to questions appropriately with a normal affect.      Labs: Cardiac Enzymes No results for input(s): CKTOTAL, CKMB, TROPONINI in the last 72 hours. CBC Lab Results  Component Value Date   WBC 6.6 07/31/2015   HGB 11.6* 07/31/2015   HCT  34.3* 07/31/2015   MCV 93.4 07/31/2015   PLT 183 07/31/2015   PROTIME: No results for input(s): LABPROT, INR in the last 72 hours. Chemistry No results for input(s): NA, K, CL, CO2, BUN, CREATININE, CALCIUM, PROT, BILITOT, ALKPHOS, ALT, AST, GLUCOSE in the last 168 hours.  Invalid input(s): LABALBU Lipids Lab Results  Component Value Date   CHOL 153 07/09/2015   HDL 34* 07/09/2015   LDLCALC 85 07/09/2015   TRIG 168* 07/09/2015   BNP No results found for: PROBNP Thyroid Function Tests: No results for input(s): TSH, T4TOTAL, T3FREE, THYROIDAB in the last 72 hours.  Invalid input(s): FREET3 Miscellaneous No results found for: DDIMER  Radiology/Studies:  [place] Intraoperative  07/29/2015  CLINICAL DATA:  Ultrasound was provided for use by the ordering physician, and a technical charge was applied by the performing facility.  No radiologist interpretation/professional services rendered.   US Renal  07/24/2015  CLINICAL DATA:  Left-sided kidney stone.  Difficulty urinating. EXAM: RENAL / URINARY TRACT ULTRASOUND COMPLETE COMPARISON:  Radiographs today. FINDINGS: Right Kidney: Length: 11.9 cm. Echogenicity within normal limits. No mass or hydronephrosis visualized. Left Kidney: Length: 14.1 cm. The left kidney is suboptimally visualized, partly obscured by bowel gas. There is a probable cyst in the interpolar region measuring approximately 4.1 x 3.6 x 4.1 cm. The left renal pelvis appears mildly distended. The calcifications demonstrated on the radiographs are not well seen, probably within the ureter. Bladder: Appears normal for degree of bladder distention. Ureteral jets were not visualized. Pelvic ascites noted. IMPRESSION: 1. Suboptimal visualization of the left kidney due to bowel gas. The left renal pelvis appears dilated, suggesting obstruction of the left ureter by the calcifications demonstrated on radiographs. 2. Left renal cyst. 3. Pelvic ascites. 4. CT suggested for further  evaluation. Electronically Signed   By: Richardean Sale M.D.   On: 07/24/2015 19:32   Dg Abd 2 Views  07/24/2015  CLINICAL DATA:  Left kidney stone.  Difficulty urinating. EXAM: ABDOMEN - 2 VIEW COMPARISON:  None. FINDINGS: Normal bowel gas pattern without free peritoneal air. Adjacent 2.8 x 1.2 cm and 1.4 x 0.9 cm oval calcifications in  the expected position of the proximal to mid left ureter. Mild lumbar spine degenerative changes. IMPRESSION: 2.8 cm and 1.4 cm probable left ureteral calculi. Calcified lymph nodes are less likely. Electronically Signed   By: Claudie Revering M.D.   On: 07/24/2015 12:53   Ct Renal Stone Study  07/25/2015  CLINICAL DATA:  Left flank pain.  History kidney stones. EXAM: CT ABDOMEN AND PELVIS WITHOUT CONTRAST TECHNIQUE: Multidetector CT imaging of the abdomen and pelvis was performed following the standard protocol without IV contrast. COMPARISON:  Renal ultrasound and abdominal radiographs 1 day prior. FINDINGS: Lower chest: Heart mildly enlarged. Coronary artery calcifications are seen. There is a small left pleural effusion. Liver: Enlarged with diffusely decreased density consistent with steatosis. Hepatobiliary: Gallbladder physiologically distended, no calcified stone. No biliary dilatation. Pancreas: No ductal dilatation or inflammation. Spleen: Normal in size measuring 11.6 cm greatest dimension. Adrenal glands: No nodule. Kidneys: There are 2 large stones in the mid proximal left ureter. More proximal stone measures 1.3 x 1.1 x 2.2 cm, with an adjacent stone measuring 2.5 x 1.6 x 1.4 cm. Moderate proximal hydroureteronephrosis with increased density of the urine in the renal collecting system, suggestive of blood. Ureter distal to this is decompressed, however is an additional 0.9 x 1.0 x 0.6 cm stone in the left distal ureter just proximal to the ureterovesicular junction. There is thinning of the left renal parenchyma. Small nonobstructing stone in the lower left kidney.  Cyst in the interpolar left kidney measures 3.9 cm. No stones or obstructive uropathy of the right kidney. Right ureter is decompressed. Stomach/Bowel: Limited bowel assessment secondary to lack of enteric contrast, intra-abdominal ascites, and soft tissue attenuation from body habitus. Stomach physiologically distended with ingested contents. There are no dilated or thickened small bowel loops. Small volume of stool throughout the colon without colonic wall thickening. The appendix is not confidently identified. Vascular/Lymphatic: No retroperitoneal adenopathy. Abdominal aorta is normal in caliber moderate aortic atherosclerosis without aneurysm. Reproductive: Prostatic calcifications. Bladder: Decompressed, probable intraluminal blood. Other: Moderate volume of intra-abdominal and pelvic ascites. Free fluid in the mesenteric, pelvis, both pericolic gutters in both upper quadrants. Mild mesenteric and whole body wall edema. No free air. No loculated intra-abdominal fluid collection. Musculoskeletal: There are no acute or suspicious osseous abnormalities. Facet arthropathy in the lower lumbar spine. IMPRESSION: 1. Left hydroureteronephrosis with 2 large stones in the mid proximal ureter measuring 2.2 and 2.5 cm. Ureter distal to this is decompressed, however there is an additional 1 cm stone in the distal left ureter, just proximal to the ureterovesicular junction. Increased density of the urine in the bladder and left renal collecting system consistent with hemorrhage. 2. Moderate volume intra-abdominal ascites. There is hepatic steatosis. 3. Atherosclerosis, including coronary artery calcifications. Cardiomegaly and small left effusion in the included lung bases. Electronically Signed   By: Jeb Levering M.D.   On: 07/25/2015 19:45   Ir  Nephroureteral Cath Place Left  07/29/2015  INDICATION: 65 year old male with a history of left-sided nephrolithiasis and obstructive hydronephrosis. He has been referred  for evaluation of percutaneous nephrostomy. EXAM: IR NEPHROURETERAL CATH PLACEMENT RIGHT COMPARISON:  CT 07/25/2015 MEDICATIONS: None ANESTHESIA/SEDATION: Fentanyl 100 mcg IV; Versed 4.0 mg IV Moderate Sedation Time:  10 minutes The patient was continuously monitored during the procedure by the interventional radiology nurse under my direct supervision. CONTRAST:  27mL OMNIPAQUE IOHEXOL 300 MG/ML SOLN - administered into the collecting system(s) FLUOROSCOPY TIME:  Fluoroscopy Time: 0 minutes 30 seconds COMPLICATIONS: None PROCEDURE: Informed  written consent was obtained from the patient after a thorough discussion of the procedural risks, benefits and alternatives. All questions were addressed. Maximal Sterile Barrier Technique was utilized including caps, mask, sterile gowns, sterile gloves, sterile drape, hand hygiene and skin antiseptic. A timeout was performed prior to the initiation of the procedure. Patient positioned prone position on the fluoroscopy table. Ultrasound survey of the left flank was performed. Once the patient is prepped and draped in usual sterile fashion, scout image of the abdomen was performed. The skin and subcutaneous tissues were generously infiltrated 1% lidocaine for local anesthesia. Using ultrasound guidance, a 22 gauge needle was placed into the collecting system in the inferior pole through a posterior calyx. With spontaneous urine returned, modified Seldinger technique was used to place a 10 [place] catheter after serial dilation of the tissue tract with 8 [place], 10 [place], 12 [place] catheters. The [place] did not accept the [employer] wire, and a 014 [place] wire was used for access. This 014 wire did not traverse the ureter, and could not be passed beyond the proximal stone. Once the 10 French catheter was in place, contrast was infused confirming position. Catheter was sutured in position attached to gravity drainage. Patient tolerated the procedure well and remained  hemodynamically stable throughout. No complications were encountered and no significant blood loss encountered. IMPRESSION: Status post image guided left percutaneous nephrostomy tube. Sample was sent to the lab for analysis. An 014 wire could not be passed beyond the proximal stone in the ureter, likely secondary to current inflammation. Signed, Dulcy Fanny. Earleen Newport DO [facility] PLAN: Discussed with Dr. Erlene Quan. Plan for rendezvous procedure at the time of stone extraction. She is hopeful to place a wire through the percutaneous nephrostomy through the ureter for access from above. Electronically Signed   By: Corrie Mckusick D.O.   On: 07/29/2015 13:07    EKG:  From 13 March sinus rhythm at 85 Intervals 22/12/42 Poor R-wave progression Left axis deviation  Assessment and Plan:   Ischemic cardiomyopathy   Congestive heart failure-chronic-systolic  Morbidly obese  Obstructive sleep apnea    The patient has significant left ventricular dysfunction. Prior to making a decision regarding ICD therapy, we will try to augment his afterload reduction. We will add low-dose losartan and increase his carvedilol. Repeat LV assessment I will defer to Dr.  Orland Dec  if LV function remains impaired ICD for backup would be an appropriate consideration.  He also has significant sleep disordered breathing and daytime somnolence. He needs a sleep study.  Following the initiation of his losartan we'll arrange for his metabolic profile to be drawn next week.    we will see him as needed following reassessment by Dr. Koleen Nimrod

## 2015-08-22 NOTE — Patient Instructions (Signed)
Medication Instructions: 1) Increase Coreg to 6.25 mg one tablet by mouth twice daily 2) Start Losartan 25 mg one tablet by mouth once daily  Labwork: - Your physician recommends that you have lab work in: 1 week (along with your pre-op labs) BMP  Procedures/Testing: - Dr. Caryl Comes will forward his note to your PCP about the possibility of ordering a sleep study for you.  Follow-Up: - Dr. Caryl Comes will see you back on an as needed basis.  Any Additional Special Instructions Will Be Listed Below (If Applicable).     If you need a refill on your cardiac medications before your next appointment, please call your pharmacy.

## 2015-08-23 ENCOUNTER — Telehealth: Payer: Self-pay

## 2015-08-23 DIAGNOSIS — N390 Urinary tract infection, site not specified: Secondary | ICD-10-CM

## 2015-08-23 NOTE — Telephone Encounter (Signed)
Spoke with [name] are [facility] who stated they are not able to add sensitivities to organism 1 but they can for organism 2-mixed flora. Please advise.

## 2015-08-23 NOTE — Telephone Encounter (Signed)
Yes, please add sensitivities for mixed flora.    Jim Espy, MD

## 2015-08-23 NOTE — Telephone Encounter (Signed)
Spoke w/ Tabbatha at [facility] and had sensitivities added on for the mixed flora/SW

## 2015-08-23 NOTE — Telephone Encounter (Signed)
-----   Message from Hollice Espy, MD sent at 08/22/2015  4:48 PM EDT ----- Can you call the lab and request sensitivities on this organism.  He needs to be treated preop.    Hollice Espy, MD

## 2015-08-24 ENCOUNTER — Other Ambulatory Visit: Payer: Self-pay | Admitting: Cardiology

## 2015-08-24 ENCOUNTER — Other Ambulatory Visit: Payer: Self-pay | Admitting: *Deleted

## 2015-08-24 ENCOUNTER — Other Ambulatory Visit: Payer: BLUE CROSS/BLUE SHIELD

## 2015-08-24 MED ORDER — APIXABAN 5 MG PO TABS: 5.0000 mg | ORAL_TABLET | Freq: Two times a day (BID) | ORAL | Status: DC

## 2015-08-24 NOTE — Telephone Encounter (Signed)
Please review for refill, Thank you. 

## 2015-08-26 LAB — ID 3

## 2015-08-27 LAB — ID 4

## 2015-08-28 LAB — ID 2

## 2015-08-28 LAB — SPECIMEN STATUS REPORT

## 2015-08-28 LAB — ID AND SUSCEPTIBILITY, URINE

## 2015-08-29 ENCOUNTER — Telehealth: Payer: Self-pay | Admitting: Urology

## 2015-08-29 ENCOUNTER — Other Ambulatory Visit: Payer: Self-pay

## 2015-08-29 ENCOUNTER — Inpatient Hospital Stay: Admission: RE | Admit: 2015-08-29 | Payer: BLUE CROSS/BLUE SHIELD | Source: Ambulatory Visit

## 2015-08-29 DIAGNOSIS — N390 Urinary tract infection, site not specified: Secondary | ICD-10-CM

## 2015-08-29 MED ORDER — NITROFURANTOIN MONOHYD MACRO 100 MG PO CAPS: 100.0000 mg | ORAL_CAPSULE | Freq: Two times a day (BID) | ORAL | Status: AC

## 2015-08-29 MED ORDER — TETRACYCLINE HCL 500 MG PO CAPS: 500.0000 mg | ORAL_CAPSULE | Freq: Two times a day (BID) | ORAL | Status: DC

## 2015-08-29 MED ORDER — LEVOFLOXACIN 500 MG PO TABS: 500.0000 mg | ORAL_TABLET | Freq: Every day | ORAL | Status: DC

## 2015-08-29 NOTE — Telephone Encounter (Signed)
-----   Message from Hollice Espy, MD sent at 08/29/2015 10:58 AM EDT ----- Please treat with macrobid 100 mg bid x 14 days AND levaquin 500 mg daily.  (Two different organisms)  Hollice Espy, MD

## 2015-08-29 NOTE — Telephone Encounter (Signed)
Pharmacy consult, medically contraindicated due to risk of QT prolongation. Such, we'll change his medication to tetracycline 500 mg twice a day 7 days.  He should also take Macrobid.    Hollice Espy, MD

## 2015-08-29 NOTE — Telephone Encounter (Signed)
Medication has been sent to pharmacy and pt was made aware. Pt voiced understanding of taking both medications.

## 2015-08-29 NOTE — Telephone Encounter (Signed)
Nurse from [facility] called to let you know they prescribed Macrobid 100 mg 2 x day for 7 days.

## 2015-08-29 NOTE — Telephone Encounter (Signed)
Pt notified of medication change to Macrobid 100mg  bid x 14 days AND tetracycline 500mg  bid x 7 days. Scripts sent to [facility] in [place]. Also notified pt that pre-admit testing appt was changed to 3/29 @11 :45. Pt voices understanding.

## 2015-08-31 ENCOUNTER — Encounter
Admission: RE | Admit: 2015-08-31 | Discharge: 2015-08-31 | Disposition: A | Discharge status: Home / Self Care | Payer: BLUE CROSS/BLUE SHIELD | Source: Ambulatory Visit | Attending: Urology | Admitting: Urology

## 2015-08-31 DIAGNOSIS — Z01812 Encounter for preprocedural laboratory examination: Secondary | ICD-10-CM | POA: Insufficient documentation

## 2015-08-31 HISTORY — DX: Calculus of kidney: N20.0

## 2015-08-31 HISTORY — DX: Pulmonary hypertension, unspecified: I27.20

## 2015-08-31 HISTORY — DX: Cardiomyopathy, unspecified: I42.9

## 2015-08-31 HISTORY — DX: Psoriasis, unspecified: L40.9

## 2015-08-31 LAB — BASIC METABOLIC PANEL
Anion gap: 9 (ref 5–15)
BUN: 59 mg/dL — AB (ref 6–20)
CHLORIDE: 93 mmol/L — AB (ref 101–111)
CO2: 32 mmol/L (ref 22–32)
CREATININE: 1.42 mg/dL — AB (ref 0.61–1.24)
Calcium: 10.6 mg/dL — ABNORMAL HIGH (ref 8.9–10.3)
GFR calc Af Amer: 59 mL/min — ABNORMAL LOW (ref >60)
GFR calc non Af Amer: 51 mL/min — ABNORMAL LOW (ref >60)
Glucose, Bld: 57 mg/dL — ABNORMAL LOW (ref 65–99)
Potassium: 4.6 mmol/L (ref 3.5–5.1)
Sodium: 134 mmol/L — ABNORMAL LOW (ref 135–145)

## 2015-08-31 LAB — APTT: aPTT: 36 seconds (ref 24–36)

## 2015-08-31 LAB — CBC
HEMATOCRIT: 42.7 % (ref 40.0–52.0)
HEMOGLOBIN: 14.3 g/dL (ref 13.0–18.0)
MCH: 31.2 pg (ref 26.0–34.0)
MCHC: 33.5 g/dL (ref 32.0–36.0)
MCV: 93.3 fL (ref 80.0–100.0)
Platelets: 193 10*3/uL (ref 150–440)
RBC: 4.57 MIL/uL (ref 4.40–5.90)
RDW: 16.9 % — ABNORMAL HIGH (ref 11.5–14.5)
WBC: 9.1 10*3/uL (ref 3.8–10.6)

## 2015-08-31 LAB — PROTIME-INR
INR: 1.37
Prothrombin Time: 17 seconds — ABNORMAL HIGH (ref 11.4–15.0)

## 2015-08-31 NOTE — Patient Instructions (Addendum)
  Your procedure is scheduled on: 09/07/15 Wed Report to Day Surgery.2nd floor medical mall To find out your arrival time please call [phone] between 1PM - 3PM on 09/06/15 Tues.  Remember: Instructions that are not followed completely may result in serious medical risk, up to and including death, or upon the discretion of your surgeon and anesthesiologist your surgery may need to be rescheduled.    _x___ 1. Do not eat food or drink liquids after midnight. No gum chewing or hard candies.     __x__ 2. No Alcohol for 24 hours before or after surgery.   ____ 3. Bring all medications with you on the day of surgery if instructed.    _x_ 4. Notify your doctor if there is any change in your medical condition     (cold, fever, infections).     Do not wear jewelry, make-up, hairpins, clips or nail polish.  Do not wear lotions, powders, or perfumes. You may wear deodorant.  Do not shave 48 hours prior to surgery. Men may shave face and neck.  Do not bring valuables to the hospital.    [facility] is not responsible for any belongings or valuables.               Contacts, dentures or bridgework may not be worn into surgery.  Leave your suitcase in the car. After surgery it may be brought to your room.  For patients admitted to the hospital, discharge time is determined by your                treatment team.   Patients discharged the day of surgery will not be allowed to drive home.   Please read over the following fact sheets that you were given:      x____ Take these medicines the morning of surgery with A SIP OF WATER:    1.ALPRAZolam (XANAX) 0.25 MG tablet  2. amiodarone (PACERONE) 200 MG tablet  3. carvedilol (COREG) 6.25 MG tablet  4.  5.  6.  ____ Fleet Enema (as directed)   ____ Use CHG Soap as directed  ____ Use inhalers on the day of surgery  _x___ Stop metformin 2 days prior to surgery    ____ Take 1/2 of usual insulin dose the night before surgery and none on the  morning of surgery.   _x___ Stop Coumadin/Plavix/aspirin on stop eliquis 24- 48 hours before surgery  ____ Stop Anti-inflammatories on    ____ Stop supplements until after surgery.    ____ Bring C-Pap to the hospital.

## 2015-08-31 NOTE — Pre-Procedure Instructions (Signed)
Date of Consult: 08/22/2015  Primary Physician: Elisabeth Cara, NP Primary Cardiologist: AI Consulting Physician AI  Chief Complaint: ICD   HPI [name] is a 65 y.o. male  Seen for consideration of an ICD.  I had seen him in hospital 2/17 where he had been admitted for acute on chronic congestive heart failure. to [facility]. Echocardiogram demonstrated ejection fraction of 15% with a congestive heart failure symptoms. He required inotropic therapy. He also had atrial fibrillation and was started on amiodarone and apixaban.  Cath >> Dist RCA lesion, 60% stenosed.  Prox LAD to Mid LAD lesion, 60% stenosed. The lesion was previously treated with a stent (unknown type) greater than two years ago.  Ost 2nd Diag to 2nd Diag lesion, 70% stenosed.  1. Moderately elevated filling pressures with moderate pulmonary hypertension. Mildly reduced cardiac output. Hemodynamic features are suggestive of restrictive physiology. 2. Moderate two-vessel coronary artery disease. Patent proximal LAD stent with 60% in-stent restenosis. The LAD and RCA are moderately calcified  On arrival to the hospital he was on Aldactone and hydrochlorothiazide and low-dose beta-blockade; he was not on ace/ARB. [name] modest renal insufficiency with a creatinine of 1.48 ECG 3/17 demonstrated sinus rhythm first degree AV block and a QRS duration about 120 ms     Past Medical History  Diagnosis Date  . Chronic combined systolic (congestive) and diastolic (congestive) heart failure (HCC)     a. EF 25% by cath in 2013 b. Echo in 07/2015 showing EF of 15-20%, moderate MR, moderate Pulm HTN, severely dilated IVC  . Sleep apnea   . Diabetes mellitus type 2, uncontrolled ([place])     a. A1c 11.0 in 07/2015.  [name] Renal disorder     kidney stone  . Hypertension   . New onset atrial fibrillation ([place])     a. diagnosed in 07/2015 b. started on Eliquis  . CKD (chronic kidney disease) stage 3,  GFR 30-59 ml/min   . Coronary artery disease   . Paroxysmal atrial fibrillation [facility])      Surgical History:  Past Surgical History  Procedure Laterality Date  . Kidney surgery    . Coronary angioplasty with stent placement    . Cardiac catheterization N/A 07/25/2015    Procedure: Right/Left Heart Cath and Coronary Angiography; Surgeon: Wellington Hampshire, MD; Location: [place] [facility]; Service: Cardiovascular; Laterality: N/A;  . Cardiac catheterization      [place]  . Cardiac catheterization      [facility], [place]     Home Meds: Prior to Admission medications   Medication Sig Start Date End Date Taking? Authorizing Provider  ALPRAZolam (XANAX) 0.25 MG tablet Take 1 tablet (0.25 mg total) by mouth every 8 (eight) hours as needed for anxiety. 08/01/15  Yes Lytle Butte, MD  amiodarone (PACERONE) 200 MG tablet Take 1 tablet (200 mg total) by mouth daily. 08/01/15  Yes Lytle Butte, MD  apixaban (ELIQUIS) 5 MG TABS tablet Take 1 tablet (5 mg total) by mouth 2 (two) times daily. 08/01/15  Yes Lytle Butte, MD  atorvastatin (LIPITOR) 40 MG tablet Take 1 tablet (40 mg total) by mouth daily at 6 PM. 08/01/15  Yes Lytle Butte, MD  b complex vitamins tablet Take 1 tablet by mouth daily.   Yes Historical Provider, MD  carvedilol (COREG) 3.125 MG tablet Take 1 tablet (3.125 mg total) by mouth 2 (two) times daily with a meal. 08/01/15  Yes Lytle Butte, MD  cholecalciferol (VITAMIN D)  1000 units tablet Take 2,000 Units by mouth daily.   Yes Historical Provider, MD  Coenzyme Q10 (CO Q 10 PO) Take 400 mg by mouth daily.   Yes Historical Provider, MD  Cranberry 500 MG CAPS Take 1,000 mg by mouth daily.   Yes Historical Provider, MD  furosemide (LASIX) 80 MG tablet Take 1 tablet (80 mg total) by mouth 2 (two) times daily. 08/01/15  Yes Lytle Butte, MD  glipiZIDE (GLUCOTROL) 10  MG tablet Take 20-30 mg by mouth 2 (two) times daily. Pt takes three tablets in the morning and two tablets at bedtime.   Yes Historical Provider, MD  Cathrine Muster 550 MG CAPS Take 550 mg by mouth daily.   Yes Historical Provider, MD  metFORMIN (GLUCOPHAGE) 500 MG tablet Take 1,000 mg by mouth 2 (two) times daily with a meal.   Yes Historical Provider, MD  metolazone (ZAROXOLYN) 2.5 MG tablet Take 1 tablet (2.5 mg total) by mouth daily. 08/01/15  Yes Lytle Butte, MD  multivitamin-lutein [facility]) CAPS capsule Take 1 capsule by mouth 2 (two) times daily.   Yes Historical Provider, MD  Olive Leaf Extract 250 MG CAPS Take 250 mg by mouth 2 (two) times daily.   Yes Historical Provider, MD  OVER THE COUNTER MEDICATION Take 3 capsules by mouth 2 (two) times daily. Pt takes beet root.   Yes Historical Provider, MD  OVER THE COUNTER MEDICATION Take 1 capsule by mouth daily. Pt takes caprylic acid.   Yes Historical Provider, MD  oxyCODONE-acetaminophen (PERCOCET/ROXICET) 5-325 MG tablet Take 1 tablet by mouth every 4 (four) hours as needed for moderate pain. 08/01/15  Yes Lytle Butte, MD  polyethylene glycol [facility] / GLYCOLAX) packet Take 17 g by mouth daily as needed for mild constipation.   Yes Historical Provider, MD  potassium phosphate, monobasic, (K-PHOS ORIGINAL) 500 MG tablet Take 500 mg by mouth 2 (two) times daily with a meal.   Yes Historical Provider, MD  spironolactone (ALDACTONE) 25 MG tablet Take 25 mg by mouth 2 (two) times daily.    Yes Historical Provider, MD  TURMERIC CURCUMIN PO Take 1 capsule by mouth daily.   Yes Historical Provider, MD  vitamin C (ASCORBIC ACID) 500 MG tablet Take 1,000 mg by mouth daily.   Yes Historical Provider, MD  vitamin E 400 UNIT capsule Take 400 Units by mouth daily.   Yes Historical Provider, MD  Wheat Dextrin (BENEFIBER) CHEW Chew 1 tablet by mouth daily as  needed.   Yes Historical Provider, MD    Allergies: No Known Allergies  Social History   Social History  . Marital Status: Single    Spouse Name: N/A  . Number of Children: N/A  . Years of Education: N/A   Occupational History  . Not on file.   Social History Main Topics  . Smoking status: Never Smoker   . Smokeless tobacco: Never Used  . Alcohol Use: 0.0 oz/week    0 Standard drinks or equivalent per week     Comment: occasionally  . Drug Use: No     Comment: Past  . Sexual Activity: Not on file   Other Topics Concern  . Not on file   Social History Narrative     Family History  Problem Relation Age of Onset  . Heart failure Father   . Heart attack Brother      ROS: Please see the history of present illness. All other systems reviewed and negative.  Physical Exam: Blood pressure 126/83, pulse 90, height 6' (1.829 m), weight 262 lb (118.842 kg). General: Well developed,Morbidly obese male in no acute distress. Head: Normocephalic, atraumatic, sclera non-icteric, no xanthomas, nares are without discharge. EENT: normal  Lymph Nodes: none Neck: Negative for carotid bruits. JVD not elevated. Back:without scoliosis kyphosis  Lungs: Clear bilaterally to auscultation without wheezes, rales, or rhonchi. Breathing is unlabored. Heart: RRR with S1 S2. No murmur . No rubs, or gallops appreciated. Abdomen: Soft, non-tender, non-distended with normoactive bowel sounds. No hepatomegaly. No rebound/guarding. No obvious abdominal masses. Msk: Strength and tone appear normal for age. Extremities: No clubbing or cyanosis. 2+ edema. Distal pedal pulses are 2+ and equal bilaterally. Skin: Warm and Dry Neuro: Alert and oriented X 3. CN III-XII intact Grossly normal sensory and motor function . Psych: Responds to questions appropriately with a normal affect.      Labs: Cardiac Enzymes  Recent Labs (last 2 labs)     No results for input(s): CKTOTAL, CKMB, TROPONINI in the last 72 hours.   CBC  Recent Labs    Lab Results  Component Value Date   WBC 6.6 07/31/2015   HGB 11.6* 07/31/2015   HCT 34.3* 07/31/2015   MCV 93.4 07/31/2015   PLT 183 07/31/2015     PROTIME:  Recent Labs (last 2 labs)     No results for input(s): LABPROT, INR in the last 72 hours.    Last Labs     Chemistry No results for input(s): NA, K, CL, CO2, BUN, CREATININE, CALCIUM, PROT, BILITOT, ALKPHOS, ALT, AST, GLUCOSE in the last 168 hours.  Invalid input(s): LABALBU   Lipids  Recent Labs    Lab Results  Component Value Date   CHOL 153 07/09/2015   HDL 34* 07/09/2015   LDLCALC 85 07/09/2015   TRIG 168* 07/09/2015     BNP  Last Labs    No results found for: PROBNP   Thyroid Function Tests:  Recent Labs (last 2 labs)     No results for input(s): TSH, T4TOTAL, T3FREE, THYROIDAB in the last 72 hours.  Invalid input(s): FREET3   Miscellaneous  Recent Labs    No results found for: DDIMER    Radiology/Studies:   Imaging Results    [place] Intraoperative  07/29/2015 CLINICAL DATA: Ultrasound was provided for use by the ordering physician, and a technical charge was applied by the performing facility. No radiologist interpretation/professional services rendered.   US Renal  07/24/2015 CLINICAL DATA: Left-sided kidney stone. Difficulty urinating. EXAM: RENAL / URINARY TRACT ULTRASOUND COMPLETE COMPARISON: Radiographs today. FINDINGS: Right Kidney: Length: 11.9 cm. Echogenicity within normal limits. No mass or hydronephrosis visualized. Left Kidney: Length: 14.1 cm. The left kidney is suboptimally visualized, partly obscured by bowel gas. There is a probable cyst in the interpolar region measuring approximately 4.1 x 3.6 x 4.1 cm. The left renal pelvis appears mildly distended. The calcifications demonstrated on the  radiographs are not well seen, probably within the ureter. Bladder: Appears normal for degree of bladder distention. Ureteral jets were not visualized. Pelvic ascites noted. IMPRESSION: 1. Suboptimal visualization of the left kidney due to bowel gas. The left renal pelvis appears dilated, suggesting obstruction of the left ureter by the calcifications demonstrated on radiographs. 2. Left renal cyst. 3. Pelvic ascites. 4. CT suggested for further evaluation. Electronically Signed By: Richardean Sale M.D. On: 07/24/2015 19:32   Dg Abd 2 Views  07/24/2015 CLINICAL DATA: Left kidney stone. Difficulty urinating. EXAM: ABDOMEN - 2 VIEW COMPARISON:  None. FINDINGS: Normal bowel gas pattern without free peritoneal air. Adjacent 2.8 x 1.2 cm and 1.4 x 0.9 cm oval calcifications in the expected position of the proximal to mid left ureter. Mild lumbar spine degenerative changes. IMPRESSION: 2.8 cm and 1.4 cm probable left ureteral calculi. Calcified lymph nodes are less likely. Electronically Signed By: Claudie Revering M.D. On: 07/24/2015 12:53   Ct Renal Stone Study  07/25/2015 CLINICAL DATA: Left flank pain. History kidney stones. EXAM: CT ABDOMEN AND PELVIS WITHOUT CONTRAST TECHNIQUE: Multidetector CT imaging of the abdomen and pelvis was performed following the standard protocol without IV contrast. COMPARISON: Renal ultrasound and abdominal radiographs 1 day prior. FINDINGS: Lower chest: Heart mildly enlarged. Coronary artery calcifications are seen. There is a small left pleural effusion. Liver: Enlarged with diffusely decreased density consistent with steatosis. Hepatobiliary: Gallbladder physiologically distended, no calcified stone. No biliary dilatation. Pancreas: No ductal dilatation or inflammation. Spleen: Normal in size measuring 11.6 cm greatest dimension. Adrenal glands: No nodule. Kidneys: There are 2 large stones in the mid proximal left ureter. More proximal stone measures 1.3 x 1.1 x 2.2  cm, with an adjacent stone measuring 2.5 x 1.6 x 1.4 cm. Moderate proximal hydroureteronephrosis with increased density of the urine in the renal collecting system, suggestive of blood. Ureter distal to this is decompressed, however is an additional 0.9 x 1.0 x 0.6 cm stone in the left distal ureter just proximal to the ureterovesicular junction. There is thinning of the left renal parenchyma. Small nonobstructing stone in the lower left kidney. Cyst in the interpolar left kidney measures 3.9 cm. No stones or obstructive uropathy of the right kidney. Right ureter is decompressed. Stomach/Bowel: Limited bowel assessment secondary to lack of enteric contrast, intra-abdominal ascites, and soft tissue attenuation from body habitus. Stomach physiologically distended with ingested contents. There are no dilated or thickened small bowel loops. Small volume of stool throughout the colon without colonic wall thickening. The appendix is not confidently identified. Vascular/Lymphatic: No retroperitoneal adenopathy. Abdominal aorta is normal in caliber moderate aortic atherosclerosis without aneurysm. Reproductive: Prostatic calcifications. Bladder: Decompressed, probable intraluminal blood. Other: Moderate volume of intra-abdominal and pelvic ascites. Free fluid in the mesenteric, pelvis, both pericolic gutters in both upper quadrants. Mild mesenteric and whole body wall edema. No free air. No loculated intra-abdominal fluid collection. Musculoskeletal: There are no acute or suspicious osseous abnormalities. Facet arthropathy in the lower lumbar spine. IMPRESSION: 1. Left hydroureteronephrosis with 2 large stones in the mid proximal ureter measuring 2.2 and 2.5 cm. Ureter distal to this is decompressed, however there is an additional 1 cm stone in the distal left ureter, just proximal to the ureterovesicular junction. Increased density of the urine in the bladder and left renal collecting system consistent with hemorrhage. 2.  Moderate volume intra-abdominal ascites. There is hepatic steatosis. 3. Atherosclerosis, including coronary artery calcifications. Cardiomegaly and small left effusion in the included lung bases. Electronically Signed By: Jeb Levering M.D. On: 07/25/2015 19:45   Ir Nephroureteral Cath Place Left  07/29/2015 INDICATION: 65 year old male with a history of left-sided nephrolithiasis and obstructive hydronephrosis. He has been referred for evaluation of percutaneous nephrostomy. EXAM: IR NEPHROURETERAL CATH PLACEMENT RIGHT COMPARISON: CT 07/25/2015 MEDICATIONS: None ANESTHESIA/SEDATION: Fentanyl 100 mcg IV; Versed 4.0 mg IV Moderate Sedation Time: 10 minutes The patient was continuously monitored during the procedure by the interventional radiology nurse under my direct supervision. CONTRAST: 30mL OMNIPAQUE IOHEXOL 300 MG/ML SOLN - administered into the collecting system(s) FLUOROSCOPY TIME: Fluoroscopy Time: 0 minutes 30 seconds COMPLICATIONS:  None PROCEDURE: Informed written consent was obtained from the patient after a thorough discussion of the procedural risks, benefits and alternatives. All questions were addressed. Maximal Sterile Barrier Technique was utilized including caps, mask, sterile gowns, sterile gloves, sterile drape, hand hygiene and skin antiseptic. A timeout was performed prior to the initiation of the procedure. Patient positioned prone position on the fluoroscopy table. Ultrasound survey of the left flank was performed. Once the patient is prepped and draped in usual sterile fashion, scout image of the abdomen was performed. The skin and subcutaneous tissues were generously infiltrated 1% lidocaine for local anesthesia. Using ultrasound guidance, a 22 gauge needle was placed into the collecting system in the inferior pole through a posterior calyx. With spontaneous urine returned, modified Seldinger technique was used to place a 10 [place] catheter after serial dilation of the  tissue tract with 8 [place], 10 [place], 12 [place] catheters. The [place] did not accept the [employer] wire, and a 014 [place] wire was used for access. This 014 wire did not traverse the ureter, and could not be passed beyond the proximal stone. Once the 10 French catheter was in place, contrast was infused confirming position. Catheter was sutured in position attached to gravity drainage. Patient tolerated the procedure well and remained hemodynamically stable throughout. No complications were encountered and no significant blood loss encountered. IMPRESSION: Status post image guided left percutaneous nephrostomy tube. Sample was sent to the lab for analysis. An 014 wire could not be passed beyond the proximal stone in the ureter, likely secondary to current inflammation. Signed, Dulcy Fanny. Earleen Newport DO [facility] PLAN: Discussed with Dr. Erlene Quan. Plan for rendezvous procedure at the time of stone extraction. She is hopeful to place a wire through the percutaneous nephrostomy through the ureter for access from above. Electronically Signed By: Corrie Mckusick D.O. On: 07/29/2015 13:07     EKG: From 13 March sinus rhythm at 85 Intervals 22/12/42 Poor R-wave progression Left axis deviation  Assessment and Plan:   Ischemic cardiomyopathy   Congestive heart failure-chronic-systolic  Morbidly obese  Obstructive sleep apnea    The patient has significant left ventricular dysfunction. Prior to making a decision regarding ICD therapy, we will try to augment his afterload reduction. We will add low-dose losartan and increase his carvedilol. Repeat LV assessment I will defer to Dr. Orland Dec if LV function remains impaired ICD for backup would be an appropriate consideration.  He also has significant sleep disordered breathing and daytime somnolence. He needs a sleep study.  Following the initiation of his losartan we'll arrange for  his metabolic profile to be drawn next week.   we will see him as needed following reassessment by Dr. Koleen Nimrod

## 2015-09-02 LAB — URINE CULTURE: Culture: NO GROWTH

## 2015-09-07 ENCOUNTER — Ambulatory Visit
Admission: RE | Admit: 2015-09-07 | Discharge: 2015-09-07 | Disposition: A | Discharge status: Home / Self Care | Payer: BLUE CROSS/BLUE SHIELD | Source: Ambulatory Visit | Attending: Urology | Admitting: Urology

## 2015-09-07 ENCOUNTER — Ambulatory Visit: Payer: BLUE CROSS/BLUE SHIELD

## 2015-09-07 ENCOUNTER — Ambulatory Visit: Payer: BLUE CROSS/BLUE SHIELD | Admitting: Anesthesiology

## 2015-09-07 ENCOUNTER — Encounter
Admission: RE | Disposition: A | Discharge status: Home / Self Care | Payer: Self-pay | Source: Ambulatory Visit | Attending: Urology

## 2015-09-07 DIAGNOSIS — Z7984 Long term (current) use of oral hypoglycemic drugs: Secondary | ICD-10-CM | POA: Insufficient documentation

## 2015-09-07 DIAGNOSIS — I130 Hypertensive heart and chronic kidney disease with heart failure and stage 1 through stage 4 chronic kidney disease, or unspecified chronic kidney disease: Secondary | ICD-10-CM | POA: Insufficient documentation

## 2015-09-07 DIAGNOSIS — Z955 Presence of coronary angioplasty implant and graft: Secondary | ICD-10-CM | POA: Diagnosis not present

## 2015-09-07 DIAGNOSIS — N132 Hydronephrosis with renal and ureteral calculous obstruction: Secondary | ICD-10-CM | POA: Insufficient documentation

## 2015-09-07 DIAGNOSIS — Z87442 Personal history of urinary calculi: Secondary | ICD-10-CM | POA: Diagnosis not present

## 2015-09-07 DIAGNOSIS — Z79899 Other long term (current) drug therapy: Secondary | ICD-10-CM | POA: Diagnosis not present

## 2015-09-07 DIAGNOSIS — Z936 Other artificial openings of urinary tract status: Secondary | ICD-10-CM

## 2015-09-07 DIAGNOSIS — I2510 Atherosclerotic heart disease of native coronary artery without angina pectoris: Secondary | ICD-10-CM | POA: Diagnosis not present

## 2015-09-07 DIAGNOSIS — I5042 Chronic combined systolic (congestive) and diastolic (congestive) heart failure: Secondary | ICD-10-CM | POA: Insufficient documentation

## 2015-09-07 DIAGNOSIS — E669 Obesity, unspecified: Secondary | ICD-10-CM | POA: Insufficient documentation

## 2015-09-07 DIAGNOSIS — Z7901 Long term (current) use of anticoagulants: Secondary | ICD-10-CM | POA: Insufficient documentation

## 2015-09-07 DIAGNOSIS — N201 Calculus of ureter: Secondary | ICD-10-CM

## 2015-09-07 DIAGNOSIS — R188 Other ascites: Secondary | ICD-10-CM | POA: Insufficient documentation

## 2015-09-07 DIAGNOSIS — N183 Chronic kidney disease, stage 3 (moderate): Secondary | ICD-10-CM | POA: Insufficient documentation

## 2015-09-07 DIAGNOSIS — Z6836 Body mass index (BMI) 36.0-36.9, adult: Secondary | ICD-10-CM | POA: Diagnosis not present

## 2015-09-07 DIAGNOSIS — G4730 Sleep apnea, unspecified: Secondary | ICD-10-CM | POA: Insufficient documentation

## 2015-09-07 DIAGNOSIS — I480 Paroxysmal atrial fibrillation: Secondary | ICD-10-CM | POA: Diagnosis not present

## 2015-09-07 DIAGNOSIS — E1122 Type 2 diabetes mellitus with diabetic chronic kidney disease: Secondary | ICD-10-CM | POA: Insufficient documentation

## 2015-09-07 DIAGNOSIS — N200 Calculus of kidney: Secondary | ICD-10-CM

## 2015-09-07 HISTORY — PX: CYSTOSCOPY WITH STENT PLACEMENT: SHX5790

## 2015-09-07 HISTORY — PX: URETEROSCOPY WITH HOLMIUM LASER LITHOTRIPSY: SHX6645

## 2015-09-07 LAB — APTT: APTT: 31 s (ref 24–36)

## 2015-09-07 LAB — PROTIME-INR
INR: 1.18
PROTHROMBIN TIME: 15.2 s — AB (ref 11.4–15.0)

## 2015-09-07 LAB — GLUCOSE, CAPILLARY: GLUCOSE-CAPILLARY: 139 mg/dL — AB (ref 65–99)

## 2015-09-07 IMAGING — XA IR NEPHROSTOMY PLACEMENT LEFT
6 series · 6 of 6 positions shown · non-contrast
Comparison: CT of the abdomen and pelvis - [DATE];

INDICATION: History of left-sided obstructive nephrolithiasis, post ultrasound
fluoroscopic guided left-sided percutaneous nephrostomy catheter
placement on [DATE].

[Series 1: fl - angio · 1 of 1 slices shown (1 of 6)]
[im 1/1]
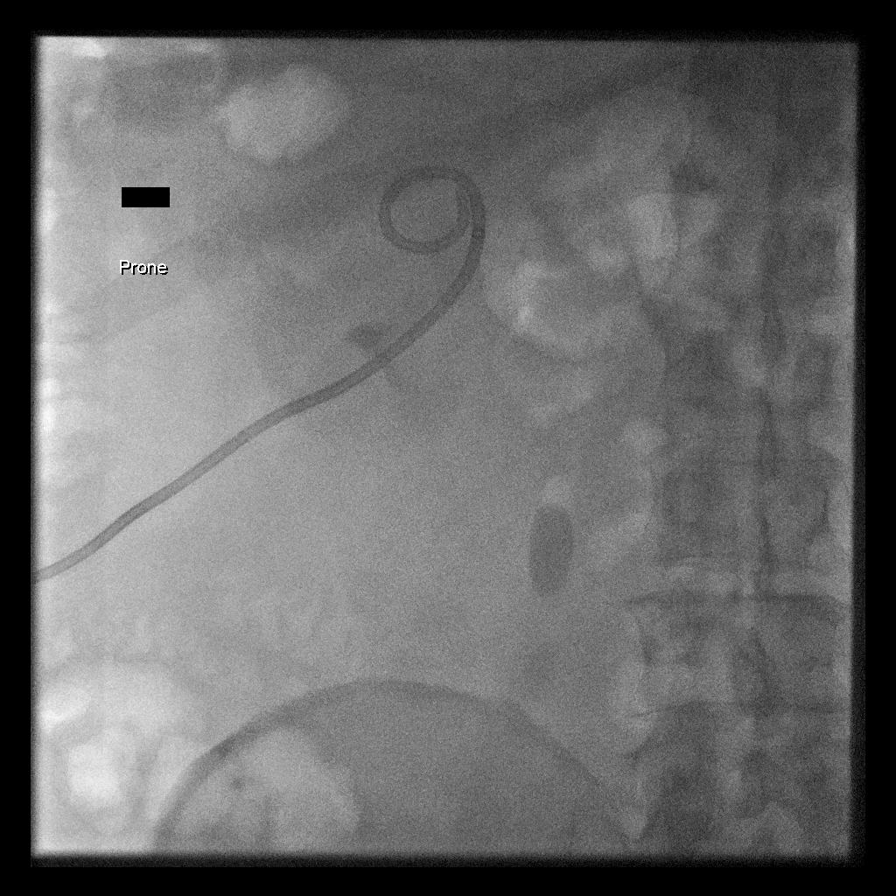

[Series 2: fl - angio · 1 of 1 slices shown (2 of 6)]
[im 1/1]
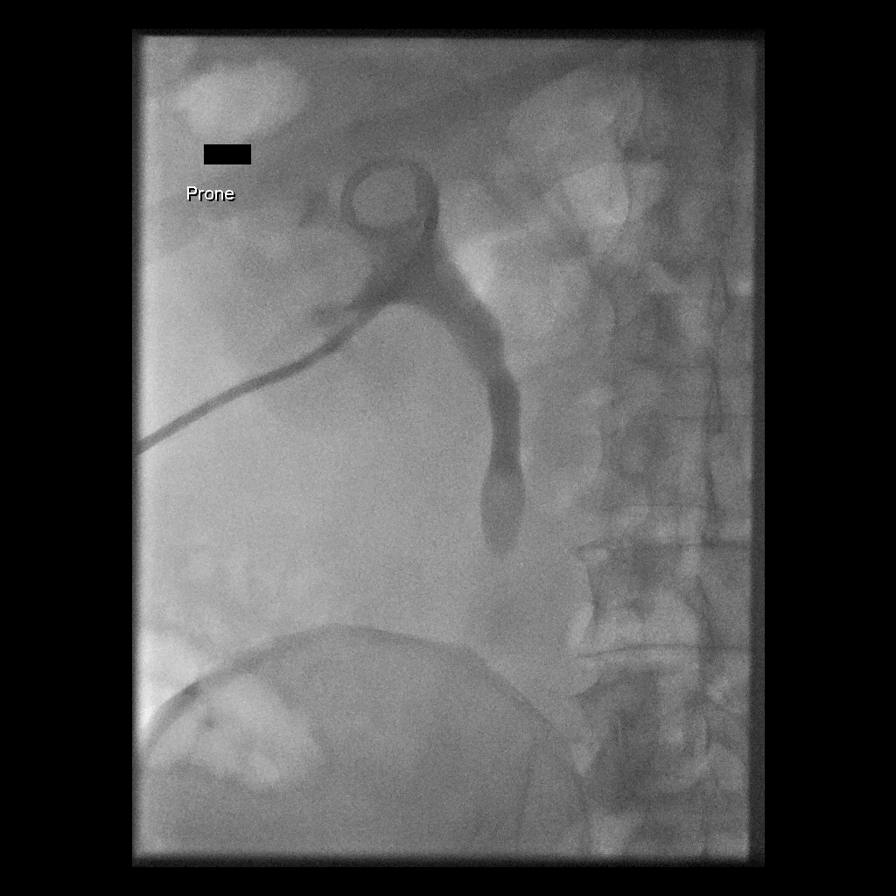

[Series 3: fl - angio · 1 of 1 slices shown (3 of 6)]
[im 1/1]
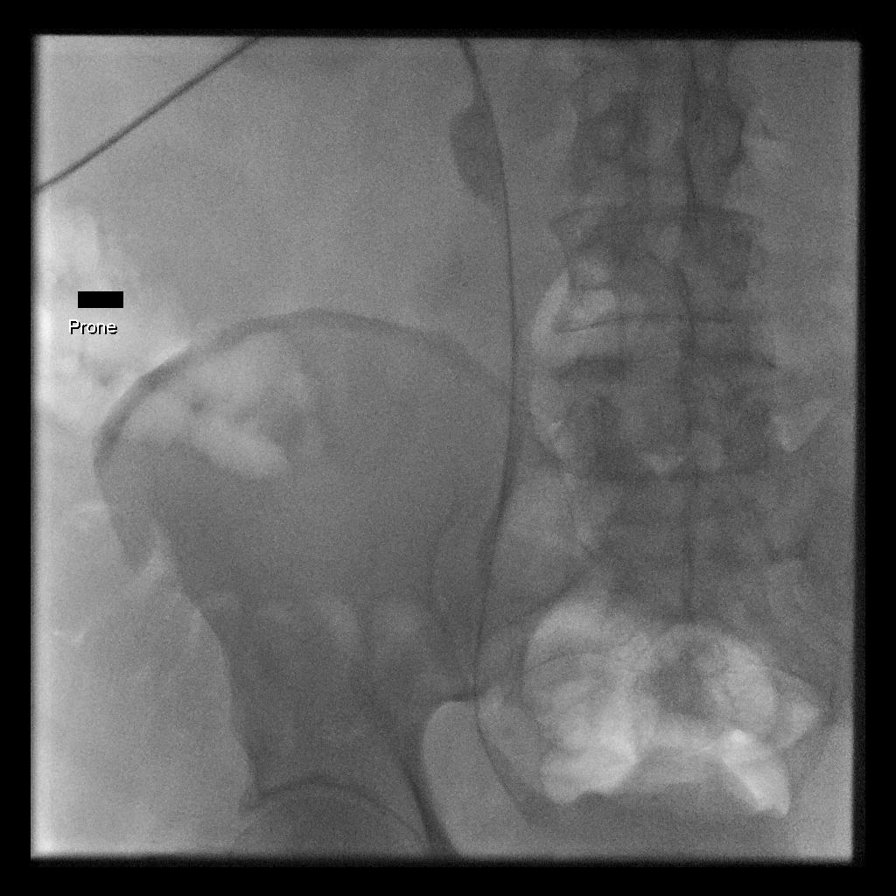

[Series 4: fl - angio · 1 of 1 slices shown (4 of 6)]
[im 1/1]
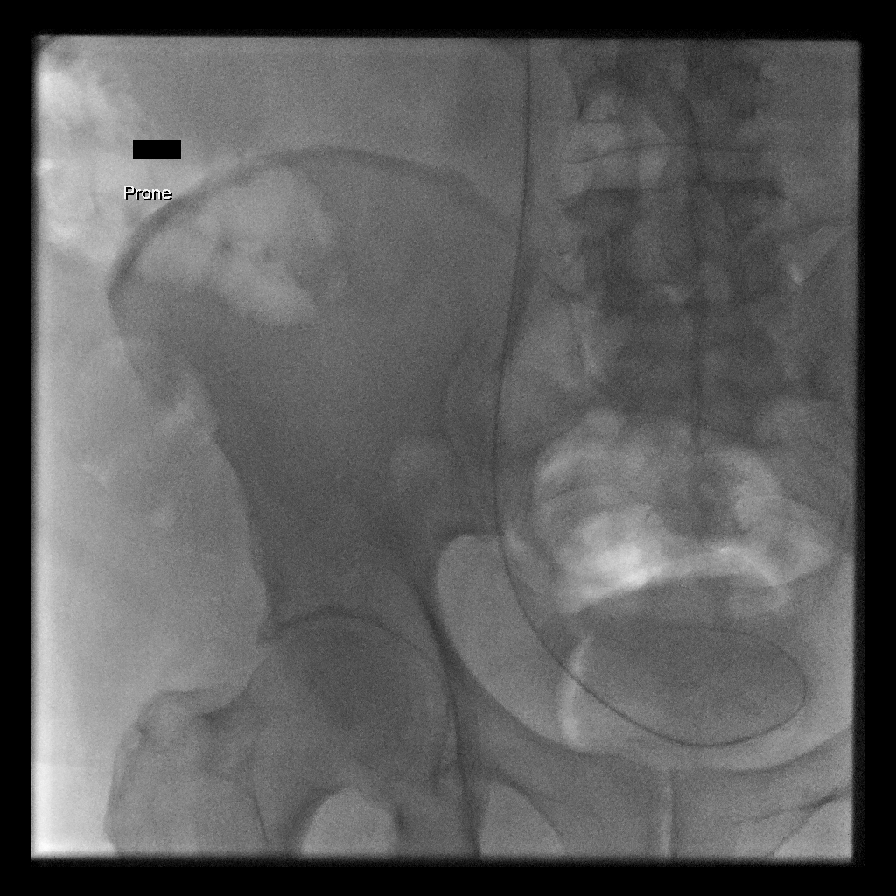

[Series 5: fl - angio · 1 of 1 slices shown (5 of 6)]
[im 1/1]
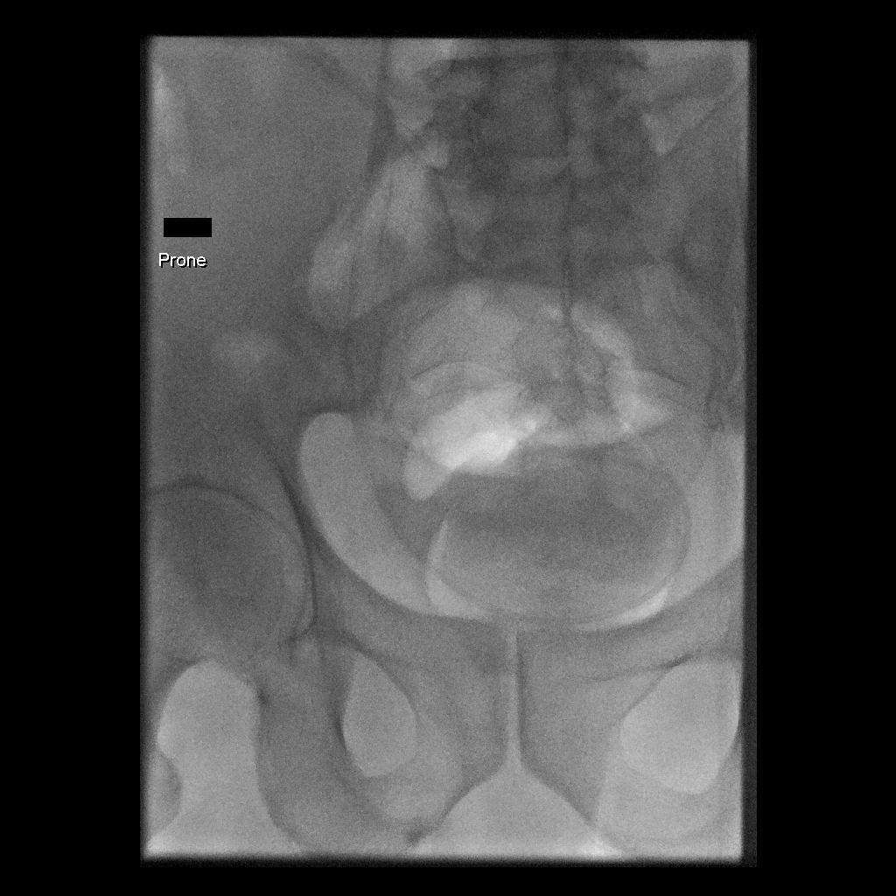

[Series 6: fl - angio · 1 of 1 slices shown (6 of 6)]
[im 1/1]
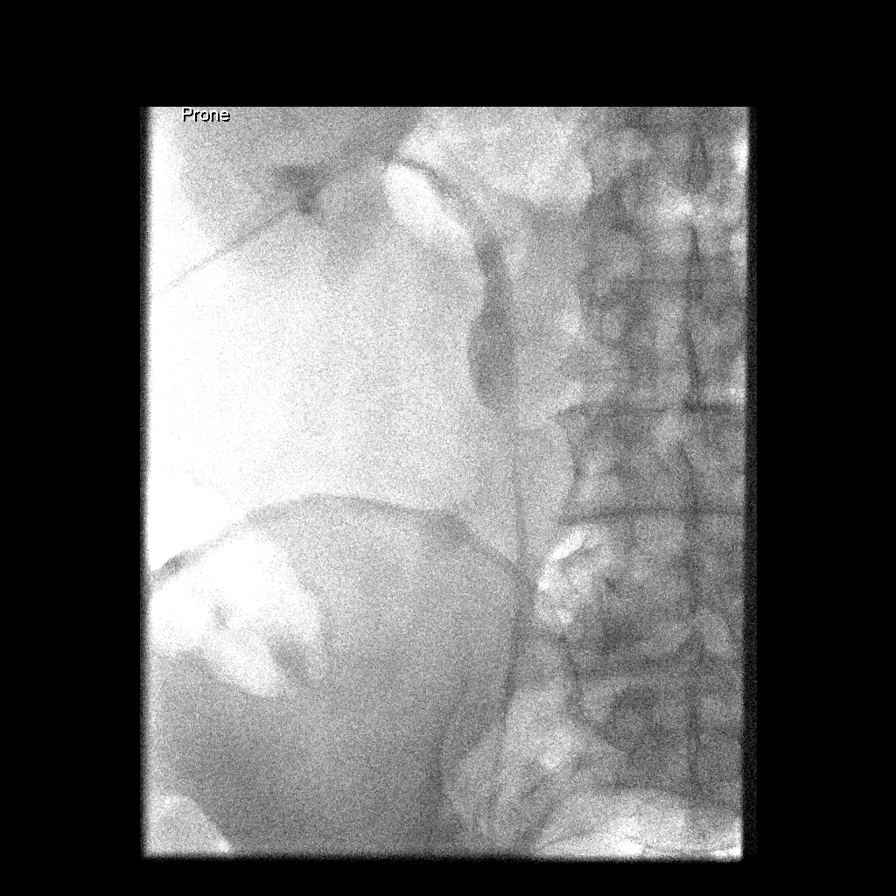

[6 of 6 positions shown; findings below may reference images not displayed]

Patient returns today to the interventional [HOSPITAL] for
attempted fluoroscopic guided conversion of existing left-sided
percutaneous nephrostomy catheter to a nephro ureteral catheter to
be utilized for the purposes of impending percutaneous
nephrolithotomy.

EXAM:
FLUOROSCOPIC GUIDED CONVERSION OF EXISTING LEFT-SIDED PERCUTANEOUS
NEPHROSTOMY CATHETER TO A NEPHRO URETERAL CATHETER FOR PURPOSES OF
IMPENDING PERCUTANEOUS NEPHROLITHOTOMY.
ultrasound
fluoroscopic guided left-sided percutaneous nephrostomy catheter
placement - [DATE] (report only, no images).

MEDICATIONS:
None

ANESTHESIA/SEDATION:
Moderate (conscious) sedation was employed during this procedure. A
total of Versed 1 mg and Fentanyl 50 mcg was administered
intravenously.

Moderate Sedation Time: 11 minutes. The patient's level of
consciousness and vital signs were monitored continuously by
radiology nursing throughout the procedure under my direct
supervision.

CONTRAST:  20 cc Omnipaque 300, administered into the left renal
collecting system.

FLUOROSCOPY TIME:  2 minutes 48 seconds (68 mGy)

COMPLICATIONS:
None immediate.

PROCEDURE:
Informed written consent was obtained from the patient after a
discussion of the risks, benefits, and alternatives to treatment.
The left flank region and distal portion of the existing left-sided
percutaneous nephrostomy catheter were prepped and draped in the
usual sterile fashion, and a sterile drape was applied covering the
operative field. A sterile gown and sterile gloves were used for the
procedure. A timeout was performed prior to the initiation of the
procedure.

A small amount of contrast was injected via the existing
percutaneous nephrostomy catheter. After the surrounding soft
tissues were anesthetized with 1% lidocaine, the external portion of
the nephrostomy catheter was cut and cannulated with a short Amplatz
wire which was coiled within the left renal collecting system.

Under intermittent fluoroscopic guidance, the existing percutaneous
nephrostomy catheter was exchanged for a 6 French, 25 cm radiopaque
vascular sheath. With the use of a regular glidewire, an MPA
catheter was advanced through the left renal collecting system and
ureter to the level of the urinary bladder. Contrast injection
confirmed appropriate positioning.

Over a stiff Amplatz wire, the vascular sheath was removed and the
external portion of the MPA catheter was secured at the skin exit
site with a single interrupted suture. The catheter was capped and a
dressing was placed. The patient tolerated the procedure well
without immediate post procedural complication.
FINDINGS: Pre procedural spot radiographic images demonstrates grossly
unchanged appearance of nonobstructing stone within the inferior
pole of the left renal calyx as well as the dominant stone overlying
the expected location of the superior aspect of the left ureter.

Contrast injection demonstrated appropriate positioning and
functionality existing left-sided percutaneous nephrostomy catheter.
No definitive contrast passed beyond the dominant stone within the
superior aspect the left ureter.

After fluoroscopic guided exchanged conversion, the tip of the MPA
catheter terminates within the urinary bladder.
IMPRESSION: Successful fluoroscopic guided conversion of existing left-sided
percutaneous nephrostomy catheter to an MPA catheter with tip
terminating within the urinary bladder. The catheter will be
utilized during impending nephrolithotomy procedure.

## 2015-09-07 SURGERY — URETEROSCOPY, WITH LITHOTRIPSY USING HOLMIUM LASER
Anesthesia: General | Laterality: Left

## 2015-09-07 MED ORDER — PROPOFOL 10 MG/ML IV BOLUS
INTRAVENOUS | Status: DC | PRN
  Administered 2015-09-07: 100 mg via INTRAVENOUS

## 2015-09-07 MED ORDER — OXYBUTYNIN CHLORIDE 5 MG PO TABS
5.0000 mg | ORAL_TABLET | Freq: Three times a day (TID) | ORAL | Status: DC | PRN
  Administered 2015-09-07: 5 mg via ORAL
  Filled 2015-09-07 (×2): qty 1

## 2015-09-07 MED ORDER — SUCCINYLCHOLINE CHLORIDE 20 MG/ML IJ SOLN
INTRAMUSCULAR | Status: DC | PRN
  Administered 2015-09-07: 80 mg via INTRAVENOUS

## 2015-09-07 MED ORDER — GENTAMICIN SULFATE 40 MG/ML IJ SOLN: 80.0000 mg | Freq: Once | INTRAVENOUS | Status: DC

## 2015-09-07 MED ORDER — SODIUM CHLORIDE 0.9 % IV SOLN
INTRAVENOUS | Status: AC
  Filled 2015-09-07: qty 1000

## 2015-09-07 MED ORDER — MIDAZOLAM HCL 2 MG/2ML IJ SOLN
INTRAMUSCULAR | Status: DC | PRN
  Administered 2015-09-07: 1 mg via INTRAVENOUS

## 2015-09-07 MED ORDER — FENTANYL CITRATE (PF) 100 MCG/2ML IJ SOLN
INTRAMUSCULAR | Status: AC
  Filled 2015-09-07: qty 4

## 2015-09-07 MED ORDER — SODIUM CHLORIDE 0.9 % IV SOLN
INTRAVENOUS | Status: DC
  Administered 2015-09-07 (×3): via INTRAVENOUS

## 2015-09-07 MED ORDER — SUGAMMADEX SODIUM 200 MG/2ML IV SOLN
INTRAVENOUS | Status: DC | PRN
  Administered 2015-09-07: 229.6 mg via INTRAVENOUS

## 2015-09-07 MED ORDER — IOTHALAMATE MEGLUMINE 43 % IV SOLN
INTRAVENOUS | Status: DC | PRN
  Administered 2015-09-07: 30 mL

## 2015-09-07 MED ORDER — GENTAMICIN IN SALINE 1.6-0.9 MG/ML-% IV SOLN
80.0000 mg | Freq: Once | INTRAVENOUS | Status: AC
  Administered 2015-09-07: 80 mg via INTRAVENOUS
  Filled 2015-09-07: qty 50

## 2015-09-07 MED ORDER — ONDANSETRON HCL 4 MG/2ML IJ SOLN: 4.0000 mg | Freq: Once | INTRAMUSCULAR | Status: DC | PRN

## 2015-09-07 MED ORDER — FAMOTIDINE 20 MG PO TABS
ORAL_TABLET | ORAL | Status: AC
  Administered 2015-09-07: 20 mg via ORAL
  Filled 2015-09-07: qty 1

## 2015-09-07 MED ORDER — LIDOCAINE HCL (CARDIAC) 20 MG/ML IV SOLN
INTRAVENOUS | Status: DC | PRN
  Administered 2015-09-07: 40 mg via INTRAVENOUS

## 2015-09-07 MED ORDER — FENTANYL CITRATE (PF) 100 MCG/2ML IJ SOLN
INTRAMUSCULAR | Status: AC | PRN
  Administered 2015-09-07: 50 ug via INTRAVENOUS

## 2015-09-07 MED ORDER — OXYBUTYNIN CHLORIDE 5 MG PO TABS: 5.0000 mg | ORAL_TABLET | Freq: Three times a day (TID) | ORAL | Status: DC | PRN

## 2015-09-07 MED ORDER — MIDAZOLAM HCL 5 MG/5ML IJ SOLN
INTRAMUSCULAR | Status: AC
  Filled 2015-09-07: qty 10

## 2015-09-07 MED ORDER — FENTANYL CITRATE (PF) 100 MCG/2ML IJ SOLN: 25.0000 ug | INTRAMUSCULAR | Status: DC | PRN

## 2015-09-07 MED ORDER — ROCURONIUM BROMIDE 100 MG/10ML IV SOLN
INTRAVENOUS | Status: DC | PRN
  Administered 2015-09-07: 10 mg via INTRAVENOUS
  Administered 2015-09-07: 40 mg via INTRAVENOUS

## 2015-09-07 MED ORDER — FENTANYL CITRATE (PF) 100 MCG/2ML IJ SOLN
INTRAMUSCULAR | Status: DC | PRN
  Administered 2015-09-07: 50 ug via INTRAVENOUS

## 2015-09-07 MED ORDER — TAMSULOSIN HCL 0.4 MG PO CAPS: 0.4000 mg | ORAL_CAPSULE | Freq: Every day | ORAL | Status: DC

## 2015-09-07 MED ORDER — HYDROCODONE-ACETAMINOPHEN 5-325 MG PO TABS: 1.0000 | ORAL_TABLET | Freq: Four times a day (QID) | ORAL | Status: DC | PRN

## 2015-09-07 MED ORDER — PHENYLEPHRINE HCL 10 MG/ML IJ SOLN
INTRAMUSCULAR | Status: DC | PRN
  Administered 2015-09-07: 200 ug via INTRAVENOUS
  Administered 2015-09-07 (×2): 100 ug via INTRAVENOUS
  Administered 2015-09-07: 150 ug via INTRAVENOUS
  Administered 2015-09-07: 200 ug via INTRAVENOUS
  Administered 2015-09-07: 100 ug via INTRAVENOUS

## 2015-09-07 MED ORDER — MIDAZOLAM HCL 5 MG/5ML IJ SOLN
INTRAMUSCULAR | Status: AC | PRN
  Administered 2015-09-07: 1 mg via INTRAVENOUS

## 2015-09-07 MED ORDER — LIDOCAINE HCL (PF) 1 % IJ SOLN
INTRAMUSCULAR | Status: AC
  Filled 2015-09-07: qty 30

## 2015-09-07 MED ORDER — SODIUM CHLORIDE 0.9 % IV SOLN
1.0000 g | Freq: Once | INTRAVENOUS | Status: AC
  Administered 2015-09-07: 1 g via INTRAVENOUS

## 2015-09-07 MED ORDER — SODIUM CHLORIDE 0.9 % IV SOLN
10000.0000 ug | INTRAVENOUS | Status: DC | PRN
  Administered 2015-09-07: 50 ug/min via INTRAVENOUS

## 2015-09-07 MED ORDER — FAMOTIDINE 20 MG PO TABS
20.0000 mg | ORAL_TABLET | Freq: Once | ORAL | Status: AC
  Administered 2015-09-07: 20 mg via ORAL

## 2015-09-07 MED ORDER — IOPAMIDOL (ISOVUE-300) INJECTION 61%: 10.0000 mL | Freq: Once | INTRAVENOUS | Status: DC | PRN

## 2015-09-07 SURGICAL SUPPLY — 33 items
ADAPTER SCOPE UROLOK II (MISCELLANEOUS) IMPLANT
BAG DRAIN CYSTO-URO LG1000N (MISCELLANEOUS) ×3 IMPLANT
BASKET ZERO TIP 1.9FR (BASKET) ×3 IMPLANT
CATH FOL 2WAY LX 16X5 (CATHETERS) ×3 IMPLANT
CATH URETL 5X70 OPEN END (CATHETERS) ×3 IMPLANT
CNTNR SPEC 2.5X3XGRAD LEK (MISCELLANEOUS) ×1
CONRAY 43 FOR UROLOGY 50M (MISCELLANEOUS) ×3 IMPLANT
CONT SPEC 4OZ STER OR WHT (MISCELLANEOUS) ×2
CONTAINER SPEC 2.5X3XGRAD LEK (MISCELLANEOUS) ×1 IMPLANT
DRAPE UTILITY 15X26 TOWEL STRL (DRAPES) ×3 IMPLANT
GLOVE BIO SURGEON STRL SZ 6.5 (GLOVE) ×2 IMPLANT
GLOVE BIO SURGEONS STRL SZ 6.5 (GLOVE) ×1
GOWN STRL REUS W/ TWL LRG LVL4 (GOWN DISPOSABLE) ×2 IMPLANT
GOWN STRL REUS W/TWL LRG LVL4 (GOWN DISPOSABLE) ×4
HOLDER FOLEY CATH W/STRAP (MISCELLANEOUS) ×3 IMPLANT
INTRODUCER DILATOR DOUBLE (INTRODUCER) IMPLANT
KIT RM TURNOVER CYSTO AR (KITS) ×3 IMPLANT
LASER FIBER 200M SMARTSCOPE (Laser) IMPLANT
LASER FIBER 365M SMARTSCOPE (Laser) ×3 IMPLANT
PACK CYSTO AR (MISCELLANEOUS) ×3 IMPLANT
PREP PVP WINGED SPONGE (MISCELLANEOUS) ×3 IMPLANT
PUMP SINGLE ACTION SAP (PUMP) IMPLANT
SENSORWIRE 0.038 NOT ANGLED (WIRE) ×6
SET CYSTO W/LG BORE CLAMP LF (SET/KITS/TRAYS/PACK) ×3 IMPLANT
SHEATH URETERAL 12FRX35CM (MISCELLANEOUS) IMPLANT
SOL .9 NS 3000ML IRR  AL (IV SOLUTION) ×2
SOL .9 NS 3000ML IRR UROMATIC (IV SOLUTION) ×1 IMPLANT
STENT URET 6FRX24 CONTOUR (STENTS) IMPLANT
STENT URET 6FRX26 CONTOUR (STENTS) ×3 IMPLANT
SURGILUBE 2OZ TUBE FLIPTOP (MISCELLANEOUS) ×3 IMPLANT
SYRINGE IRR TOOMEY STRL 70CC (SYRINGE) ×3 IMPLANT
WATER STERILE IRR 1000ML POUR (IV SOLUTION) ×3 IMPLANT
WIRE SENSOR 0.038 NOT ANGLED (WIRE) ×2 IMPLANT

## 2015-09-07 NOTE — Transfer of Care (Signed)
Immediate Anesthesia Transfer of Care Note  Patient: [name]  Procedure(s) Performed: Procedure(s): URETEROSCOPY WITH HOLMIUM LASER LITHOTRIPSY (Left) CYSTOSCOPY WITH STENT PLACEMENT (Left)  Patient Location: PACU  Anesthesia Type:General  Level of Consciousness: awake  Airway & Oxygen Therapy: Patient Spontanous Breathing and Patient connected to face mask oxygen  Post-op Assessment: Report given to RN and Post -op Vital signs reviewed and stable  Post vital signs: Reviewed and stable  Last Vitals:  Filed Vitals:   09/07/15 0837 09/07/15 0855  BP: 84/60 85/64  Pulse:    Temp:    Resp:      Complications: No apparent anesthesia complications

## 2015-09-07 NOTE — OR Nursing (Signed)
Returned from [employer] post procedure.  Positioned in bed for comfort, call light in place.

## 2015-09-07 NOTE — Op Note (Signed)
Date of procedure: 09/07/2015  Preoperative diagnosis:  1.  Atrophic left kidney 2.  Left ureteral stones 3.   Flank pain  Postoperative diagnosis:  1.  same as above   Procedure: 1.  cystoscopy 2.  Left ureteroscopy 3.  Laser lithotripsy 4.  Left ureteral stent placement 5.  Removal of left percutaneous nephrostomy tube 6.  Basket extraction of Stone fragment  Surgeon: Hollice Espy, MD  Anesthesia: General  Complications: None  Intraoperative findings:  Most distal 1 mm stone along with 2.5 mm proximal ureteral stone treated today. Significant stone burden/Steinstrasse and possible additional proximal ureteral stone not yet treated. Plan for staged procedure.  EBL: minimal  Specimens: stone fragment   Drains: 6 x 26 Fr JJ ureteral stent on left  Indication: [name] is a 65 y.o. patient with  A chronically obstructed left kidney from significant left ureteral stone burden including a 1 cm, 2.5 cm and 2.2 cm ureteral stone. He does have atrophy of his left kidney but this not a surgical candidate for nephrectomy and is having left flank pain.   He previously underwent left nephrostomy tube placement in the hospital for management of his obstruction and return to interventional radiology this morning for placement of a left nephroureteral tube prothesis Kumpe catheter) to help with access alongside of the stones to facilitate today's procedure.  After reviewing the management options for treatment, he elected to proceed with the above surgical procedure(s). We have discussed the potential benefits and risks of the procedure, side effects of the proposed treatment, the likelihood of the patient achieving the goals of the procedure, and any potential problems that might occur during the procedure or recuperation. Informed consent has been obtained.  Description of procedure:  The patient was taken to the operating room and general anesthesia was induced.  The patient was placed  in the dorsal lithotomy position, prepped and draped in the usual sterile fashion, and preoperative antibiotics were administered.   A roll was placed under the left flank temporarily and the kumpe catheter was prepped into a sterile field. A preoperative time-out was performed.       this point in time, a 41 French rigid cystoscope was advanced per urethra into the bladder. A small clot was noted within the bladder which was irrigated without difficulty an visualization improved significantly. A Kumpe catheter to be seen emanating from the left ureteral orifice. The distal most aspect of this catheter was grasped using stent graspers. The stitches securing the Kumpe catheter at the left flank was cut and the Kumpe catheter was pulled gently to level of the urethral meatus. A sensor wire was then used to cannulate this catheter  To the wire was advanced up to level of the kidney but not into the nephrostomy tube tract. With the wire in ideal position, the Kumpe catheter was carefully pulled out of the left flank leaving the wire place. The wire was then noted to coil presumably within the upper pole of the kidney. This wire was snapped in place as a safety wire.   The pump was removed from the patient's back.   next, an open-ended ureteral catheter was advanced just within the UO and a retrograde pyelogram was performed. This revealed a filling defect within the distal ureter consistent with the obstructing  Stone. Contrast did not pass easily beyond this area. A 4.5 French long semirigid ureteroscope was then advanced alongside the wire within the distal ureter until the first of this encounter. A  365  micron laser fiber then was brought in and using settings of 0.8 J at 10 hertz,  The first stone was fragmented into small pieces. Of 1.9 [place] to no basket was then used to extract a large number of these Stone fragments. Once this was complete, visualization was bladder. The scope was then advanced up to the  level of the proximal ureter carefully navigating around a small narrowing at the iliacs. Within the proximal ureter, very large at least 2 cm stone was encountered. This was also clearly seen on . Another retrograde pyelogram was performed at this level which again demonstrated 2 large filling defects within the proximal ureter, hydroureteronephrosis to this level and confirmed the position of the safety wire in the upper pole of the kidney. There is no extravasation noted. The more distal of the 2 stones was then fragmented using the laser fiber. Unfortunately, at this point given the tremendous amount of stone debris, bleeding, and congestion within the ureter,  Utilization and maneuverability became difficult. This point in time, the procedure was deemed complete with plans to return to later date for staged procedure as previously discussed with the patient.    the safety wire was then backloaded over a rigid cystoscope and a 6 x 26 French double-J ureteral stent was advanced over the wire. I did have some difficulty advancing the stent past the most proximal ureteral stone was ultimately successful. When the wire was partially withdrawn, full coil was noted within the upper pole of the kidney. The wire was then fully withdrawn and a full coil stone within the bladder. The bladder was then drained. The patient was then cleaned and dried. A dressing of Telfa and  Was applied to the patient's left flank. He was then repositioned supine position, reversed from anesthesia, taken to the PACU in stable condition. He did remain hemodynamically stable throughout the surgery today.  Plan: patient will return  for left ureteroscopy for a staged procedure in attempts to clear the remainder of the ureter.  Hollice Espy, M.D.

## 2015-09-07 NOTE — Anesthesia Procedure Notes (Addendum)
Date/Time: 09/07/2015 10:30 AM Performed by: Allean Found Pre-anesthesia Checklist: Patient identified, Emergency Drugs available, Suction available, Patient being monitored and Timeout performed Patient Re-evaluated:Patient Re-evaluated prior to inductionOxygen Delivery Method: Circle system utilized Preoxygenation: Pre-oxygenation with 100% oxygen Intubation Type: IV induction Ventilation: Mask ventilation with difficulty Laryngoscope Size: Mac and 4 Grade View: Grade I Tube type: Oral Number of attempts: 1 Airway Equipment and Method: Stylet Placement Confirmation: ETT inserted through vocal cords under direct vision,  positive ETCO2 and breath sounds checked- equal and bilateral Secured at: 23 cm Dental Injury: Teeth and Oropharynx as per pre-operative assessment  Future Recommendations: Recommend- induction with short-acting agent, and alternative techniques readily available   Performed by: Tait Balistreri Comments: Full beard made mask difficult

## 2015-09-07 NOTE — Anesthesia Postprocedure Evaluation (Signed)
Anesthesia Post Note  Patient: [name]  Procedure(s) Performed: Procedure(s) (LRB): URETEROSCOPY WITH HOLMIUM LASER LITHOTRIPSY (Left) CYSTOSCOPY WITH STENT PLACEMENT (Left)  Patient location during evaluation: PACU Anesthesia Type: General Level of consciousness: awake and alert Pain management: pain level controlled Vital Signs Assessment: post-procedure vital signs reviewed and stable Respiratory status: spontaneous breathing, nonlabored ventilation, respiratory function stable and patient connected to nasal cannula oxygen Cardiovascular status: blood pressure returned to baseline and stable Postop Assessment: no signs of nausea or vomiting Anesthetic complications: no    Last Vitals:  Filed Vitals:   09/07/15 1342 09/07/15 1448  BP: 102/60 113/63  Pulse: 71 76  Temp: 35.7 C   Resp: 18 18    Last Pain:  Filed Vitals:   09/07/15 1451  PainSc: 2                  Zacharee Gaddie S

## 2015-09-07 NOTE — OR Nursing (Signed)
Out of Same Day Surgery to OR.

## 2015-09-07 NOTE — OR Nursing (Signed)
Report given to Juanita RN.

## 2015-09-07 NOTE — H&P (View-Only) (Signed)
08/16/2015 5:18 PM   [name] 08/08/1950 [id number]  Referring provider: Elisabeth Cara, NP [place], [place] [address]  Chief Complaint  Patient presents with  . New Patient (Initial Visit)    nephrostomy tube    HPI: 64 year old male with recent prolonged hospital administration for CHF (EF 15-20%) and A. Fib.  He ultimately was diuresed and appropriate for discharge. During the hospital admission, he developed some left flank pain and underwent a CT scan. This revealed extensive left ureteral stone burden with chronic left kidney obstruction noted. He underwent left percutaneous nephrostomy tube placement in interventional radiology prior to discharge.  CT stone protocol shows numerous left ureteral calcifications including a 2.2 cm, 2.5 cm left proximal stone along with a 1 cm left distal stone. The left kidney is atrophic with hydronephrosis.   He does have baseline CKD, creatinine at baseline. No fevers or chills. No dysuria.   He does have an extensive personal history of kidney stones. He underwent ESWL 2 and ureteroscopy in the past but has not seen a urologist in over 10 years. He's had intermittent flank pain for numerous years but never sought intervention.  He returns today to discuss definitive management of his stones.  His nephrostomy tube has been draining well with intermittent hematuria with activity. Fevers or chills. Mild flank discomfort from the tube.  He has seen his cardiologist, Dr. Yvone Neu as outpatient follow-up already and been cleared for surgery. He'll be able to hold his anticoagulation for 24 hours prior to the procedure (Eliquis).   PMH: Past Medical History  Diagnosis Date  . Chronic combined systolic (congestive) and diastolic (congestive) heart failure (HCC)     a. EF 25% by cath in 2013 b. Echo in 07/2015 showing EF of 15-20%, moderate MR, moderate Pulm HTN, severely dilated IVC  . Sleep apnea   . Diabetes mellitus type  2, uncontrolled ([place])     a. A1c 11.0 in 07/2015.  [name] Renal disorder     kidney stone  . Hypertension   . New onset atrial fibrillation ([place])     a. diagnosed in 07/2015 b. started on Eliquis  . CKD (chronic kidney disease) stage 3, GFR 30-59 ml/min   . Coronary artery disease   . Paroxysmal atrial fibrillation [facility])     Surgical History: Past Surgical History  Procedure Laterality Date  . Kidney surgery    . Coronary angioplasty with stent placement    . Cardiac catheterization N/A 07/25/2015    Procedure: Right/Left Heart Cath and Coronary Angiography;  Surgeon: Wellington Hampshire, MD;  Location: [facility];  Service: Cardiovascular;  Laterality: N/A;  . Cardiac catheterization      [place]  . Cardiac catheterization      [facility] [place]    Home Medications:    Medication List       This list is accurate as of: 08/16/15  5:18 PM.  Always use your most recent med list.               ALPRAZolam 0.25 MG tablet  Commonly known as:  XANAX  Take 1 tablet (0.25 mg total) by mouth every 8 (eight) hours as needed for anxiety.     amiodarone 200 MG tablet  Commonly known as:  PACERONE  Take 1 tablet (200 mg total) by mouth daily.     apixaban 5 MG Tabs tablet  Commonly known as:  ELIQUIS  Take 1 tablet (5 mg total) by  mouth 2 (two) times daily.     atorvastatin 40 MG tablet  Commonly known as:  LIPITOR  Take 1 tablet (40 mg total) by mouth daily at 6 PM.     b complex vitamins tablet  Take 1 tablet by mouth daily.     BENEFIBER Chew  Chew 1 tablet by mouth daily as needed.     carvedilol 3.125 MG tablet  Commonly known as:  COREG  Take 1 tablet (3.125 mg total) by mouth 2 (two) times daily with a meal.     cholecalciferol 1000 units tablet  Commonly known as:  VITAMIN D  Take 2,000 Units by mouth daily.     CO Q 10 PO  Take 400 mg by mouth daily.     Cranberry 500 MG Caps  Take 1,000 mg by mouth daily.     furosemide 80 MG tablet    Commonly known as:  LASIX  Take 1 tablet (80 mg total) by mouth 2 (two) times daily.     glipiZIDE 10 MG tablet  Commonly known as:  GLUCOTROL  Take 20-30 mg by mouth 2 (two) times daily. Pt takes three tablets in the morning and two tablets at bedtime.     [place] Berry 550 MG Caps  Take 550 mg by mouth daily.     metFORMIN 500 MG tablet  Commonly known as:  GLUCOPHAGE  Take 1,000 mg by mouth 2 (two) times daily with a meal.     metolazone 2.5 MG tablet  Commonly known as:  ZAROXOLYN  Take 1 tablet (2.5 mg total) by mouth daily.     multivitamin-lutein Caps capsule  Take 1 capsule by mouth 2 (two) times daily.     Olive Leaf Extract 250 MG Caps  Take 250 mg by mouth 2 (two) times daily.     OVER THE COUNTER MEDICATION  Take 3 capsules by mouth 2 (two) times daily. Pt takes beet root.     OVER THE COUNTER MEDICATION  Take 1 capsule by mouth daily. Pt takes caprylic acid.     oxyCODONE-acetaminophen 5-325 MG tablet  Commonly known as:  PERCOCET/ROXICET  Take 1 tablet by mouth every 4 (four) hours as needed for moderate pain.     polyethylene glycol packet  Commonly known as:  MIRALAX / GLYCOLAX  Take 17 g by mouth daily as needed for mild constipation.     potassium phosphate (monobasic) 500 MG tablet  Commonly known as:  K-PHOS ORIGINAL  Take 500 mg by mouth 2 (two) times daily with a meal.     spironolactone 25 MG tablet  Commonly known as:  ALDACTONE  Take 25 mg by mouth 2 (two) times daily.     TURMERIC CURCUMIN PO  Take 1 capsule by mouth daily.     vitamin C 500 MG tablet  Commonly known as:  ASCORBIC ACID  Take 1,000 mg by mouth daily.     vitamin E 400 UNIT capsule  Take 400 Units by mouth daily.        Allergies: No Known Allergies  Family History: Family History  Problem Relation Age of Onset  . Heart failure Father   . Heart attack Brother     Social History:  reports that he has never smoked. He has never used smokeless tobacco. He  reports that he drinks alcohol. He reports that he does not use illicit drugs.  ROS: UROLOGY Frequent Urination?: Yes Hard to postpone urination?: No Burning/pain with urination?: Yes  Get up at night to urinate?: Yes Leakage of urine?: No Urine stream starts and stops?: Yes Trouble starting stream?: No Do you have to strain to urinate?: No Blood in urine?: Yes Urinary tract infection?: No Sexually transmitted disease?: No Injury to kidneys or bladder?: No Painful intercourse?: No Weak stream?: No Erection problems?: Yes Penile pain?: No  Gastrointestinal Nausea?: No Vomiting?: No Indigestion/heartburn?: No Diarrhea?: No Constipation?: No  Constitutional Fever: No Night sweats?: No Weight loss?: No Fatigue?: Yes  Skin Skin rash/lesions?: Yes Itching?: Yes  Eyes Blurred vision?: Yes Double vision?: Yes  Ears/Nose/Throat Sore throat?: No Sinus problems?: No  Hematologic/Lymphatic Swollen glands?: No Easy bruising?: Yes  Cardiovascular Leg swelling?: Yes Chest pain?: No  Respiratory Cough?: Yes Shortness of breath?: Yes  Endocrine Excessive thirst?: Yes  Musculoskeletal Back pain?: Yes Joint pain?: No  Neurological Headaches?: Yes Dizziness?: Yes  Psychologic Depression?: Yes Anxiety?: Yes  Physical Exam: BP 99/69 mmHg  Pulse 90  Ht 6' (1.829 m)  Wt 269 lb 6.4 oz (122.199 kg)  BMI 36.53 kg/m2  Constitutional:  Alert and oriented, No acute distress. HEENT: [place] AT, moist mucus membranes.  Trachea midline, no masses. Cardiovascular: No clubbing, cyanosis.  Irregular rhythm. Respiratory: Normal respiratory effort, no increased work of breathing.  CTAB. GI: Abdomen is soft, nontender, nondistended, no abdominal masses.  Obese. GU: No CVA tenderness. Left percutaneous nephrostomy tube draining light pink urine. Left nephrostomy tube site with small dried adherent clot, granulation tissue around tube.   Skin: No rashes, bruises or suspicious  lesions. Neurologic: Grossly intact, no focal deficits, moving all 4 extremities. Psychiatric: Normal mood and affect.  Laboratory Data: Lab Results  Component Value Date   WBC 6.6 07/31/2015   HGB 11.6* 07/31/2015   HCT 34.3* 07/31/2015   MCV 93.4 07/31/2015   PLT 183 07/31/2015    Lab Results  Component Value Date   CREATININE 1.74* 08/08/2015    Lab Results  Component Value Date   HGBA1C 11.0* 07/08/2015    Urinalysis Pending, UCx from left nephrostomy tube collected  Pertinent Imaging: Study Result     CLINICAL DATA: Left flank pain. History kidney stones.  EXAM: CT ABDOMEN AND PELVIS WITHOUT CONTRAST  TECHNIQUE: Multidetector CT imaging of the abdomen and pelvis was performed following the standard protocol without IV contrast.  COMPARISON: Renal ultrasound and abdominal radiographs 1 day prior.  FINDINGS: Lower chest: Heart mildly enlarged. Coronary artery calcifications are seen. There is a small left pleural effusion.  Liver: Enlarged with diffusely decreased density consistent with steatosis.  Hepatobiliary: Gallbladder physiologically distended, no calcified stone. No biliary dilatation.  Pancreas: No ductal dilatation or inflammation.  Spleen: Normal in size measuring 11.6 cm greatest dimension.  Adrenal glands: No nodule.  Kidneys: There are 2 large stones in the mid proximal left ureter. More proximal stone measures 1.3 x 1.1 x 2.2 cm, with an adjacent stone measuring 2.5 x 1.6 x 1.4 cm. Moderate proximal hydroureteronephrosis with increased density of the urine in the renal collecting system, suggestive of blood. Ureter distal to this is decompressed, however is an additional 0.9 x 1.0 x 0.6 cm stone in the left distal ureter just proximal to the ureterovesicular junction. There is thinning of the left renal parenchyma. Small nonobstructing stone in the lower left kidney. Cyst in the interpolar left kidney measures 3.9 cm.  No stones or obstructive uropathy of the right kidney. Right ureter is decompressed.  Stomach/Bowel: Limited bowel assessment secondary to lack of enteric contrast, intra-abdominal ascites,  and soft tissue attenuation from body habitus. Stomach physiologically distended with ingested contents. There are no dilated or thickened small bowel loops. Small volume of stool throughout the colon without colonic wall thickening. The appendix is not confidently identified.  Vascular/Lymphatic: No retroperitoneal adenopathy. Abdominal aorta is normal in caliber moderate aortic atherosclerosis without aneurysm.  Reproductive: Prostatic calcifications.  Bladder: Decompressed, probable intraluminal blood.  Other: Moderate volume of intra-abdominal and pelvic ascites. Free fluid in the mesenteric, pelvis, both pericolic gutters in both upper quadrants. Mild mesenteric and whole body wall edema. No free air. No loculated intra-abdominal fluid collection.  Musculoskeletal: There are no acute or suspicious osseous abnormalities. Facet arthropathy in the lower lumbar spine.  IMPRESSION: 1. Left hydroureteronephrosis with 2 large stones in the mid proximal ureter measuring 2.2 and 2.5 cm. Ureter distal to this is decompressed, however there is an additional 1 cm stone in the distal left ureter, just proximal to the ureterovesicular junction. Increased density of the urine in the bladder and left renal collecting system consistent with hemorrhage. 2. Moderate volume intra-abdominal ascites. There is hepatic steatosis. 3. Atherosclerosis, including coronary artery calcifications. Cardiomegaly and small left effusion in the included lung bases.   Electronically Signed  By: Jeb Levering M.D.  On: 07/25/2015 19:45     Assessment & Plan:    1. Left ureteral stone CT scan reviewed today again. Significant left ureteral calcification occupying large portion of ureter. Again, we  discussed various options including continued management with percutaneous nephrostomy tube versus intervention. He would like to have the stones treated. There does appear to be some left renal parenchyma that could be salvaged despite atrophy. Next  We discussed surgical plane today extensively. Given his history of anticoagulation, obesity, and comorbidities, I prefer not to treat the stones in antegrade fashion. We will attempt to do staged ureteroscopy to clear his ureter. I have previously spoken with interventional radiology who has agreed to attempt to place an antegrade wire/nephroureteral stent on the day of the procedure as a safety as this is more likely to pass an antegrade fashion as a posterior retrograde. Patient is aware that he will likely require more than one procedure. Risks of surgery including risk of bleeding, infection, damage to surrounding structures including his ureter were discussed in detail. Additionally, he is somewhat high risk for general anesthesia and we have already obtained cardiac clearance.  Preop urine culture from left nephrostomy. We will obtain a voided urine culture from previous and testing.  His questions were answered today.  - CULTURE, URINE COMPREHENSIVE - IR URETERAL STENT PLACEMENT EXISTING ACCESS LEFT; Future  2. Hydronephrosis, left Obstructing ureteral stones  3. Left renal atrophy As above  4. Acute on chronic combined systolic and diastolic CHF (congestive heart failure) ([place]) Comorbidity   Schedule surgery   Hollice Espy, MD  [facility] [address] [place], [place] [address] [phone]  I spent 25 min with this patient of which greater than 50% was spent in counseling and coordination of care with the patient.

## 2015-09-07 NOTE — Anesthesia Preprocedure Evaluation (Signed)
Anesthesia Evaluation  Patient identified by MRN, date of birth, ID band Patient awake    Reviewed: Allergy & Precautions, NPO status , Patient's Chart, lab work & pertinent test results, reviewed documented beta blocker date and time   Airway Mallampati: III  TM Distance: >3 FB     Dental  (+) Chipped   Pulmonary sleep apnea ,           Cardiovascular hypertension, Pt. on medications and Pt. on home beta blockers + CAD, + Cardiac Stents and +CHF  + dysrhythmias Atrial Fibrillation      Neuro/Psych    GI/Hepatic   Endo/Other  diabetes, Type 2  Renal/GU Renal InsufficiencyRenal disease     Musculoskeletal   Abdominal   Peds  Hematology   Anesthesia Other Findings Obese. Probaable MI in the past. Hx of CHF. Paroxysmal A Fib. On eliquis d/c'd 3 days. EKG ok. Took B - Blocker and Xanax this am. BP was 65/53. Came back up to 88/58 on the next check. That's where he lives. BPs are usually less than 100. Cardiac stent in place.  Reproductive/Obstetrics                             Anesthesia Physical Anesthesia Plan  ASA: III  Anesthesia Plan: General   Post-op Pain Management:    Induction: Intravenous  Airway Management Planned: Oral ETT  Additional Equipment:   Intra-op Plan:   Post-operative Plan:   Informed Consent: I have reviewed the patients History and Physical, chart, labs and discussed the procedure including the risks, benefits and alternatives for the proposed anesthesia with the patient or authorized representative who has indicated his/her understanding and acceptance.     Plan Discussed with: CRNA  Anesthesia Plan Comments:         Anesthesia Quick Evaluation

## 2015-09-07 NOTE — Interval H&P Note (Signed)
History and Physical Interval Note:  09/07/2015 7:22 AM  [name]  has presented today for surgery, with the diagnosis of LEFT URETERAL STONES  The various methods of treatment have been discussed with the patient and family. After consideration of risks, benefits and other options for treatment, the patient has consented to  Procedure(s): URETEROSCOPY WITH HOLMIUM LASER LITHOTRIPSY (Left) CYSTOSCOPY WITH STENT PLACEMENT (Left) as a surgical intervention .  The patient's history has been reviewed, patient examined, no change in status, stable for surgery.  I have reviewed the patient's chart and labs.  Questions were answered to the patient's satisfaction.    RRR CTAB  Treated for adequately for UTI prior to surgery  Plan for IR nephroU vs wire past stones  Hollice Espy

## 2015-09-07 NOTE — OR Nursing (Signed)
Dr. Erlene Quan notified that patient reports taking Xanax this morning prior to arrival to Hospital.  This is one of the patient's routine medications.  Dr. Erlene Quan reports obtaining verbal consent after a long discussion in the office.  Patient able to state what procedure he is having today and feels comfortable signing consents. Phoebe Sharps RN present as well for consent.

## 2015-09-07 NOTE — Procedures (Signed)
Successful fluoroscopic guided conversion of left sided PCN to an MPA catheter with end coiled terminating within the urinary bladder to be used during impending PNL.  MPA Catheter capped. EBL: None No immediate post procedural complications.   Ronny Bacon, MD Pager #: [phone]

## 2015-09-07 NOTE — Discharge Instructions (Signed)
You have a ureteral stent in place.  This is a tube that extends from your kidney to your bladder.  This may cause urinary bleeding, burning with urination, and urinary frequency.  Please call our office or present to the ED if you develop fevers >101 or pain which is not able to be controlled with oral pain medications.  You may be given either Flomax and/ or ditropan to help with bladder spasms and stent pain in addition to pain medications.    Your stent is temporary and will need to be exchanged or removed within 1 month.  Failure to follow up could lead to loss of the kidney.  [facility] [address], [place], [place] [address] [phone] AMBULATORY SURGERY  DISCHARGE INSTRUCTIONS   1) The drugs that you were given will stay in your system until tomorrow so for the next 24 hours you should not:  A) Drive an automobile B) Make any legal decisions C) Drink any alcoholic beverage   2) You may resume regular meals tomorrow.  Today it is better to start with liquids and gradually work up to solid foods.  You may eat anything you prefer, but it is better to start with liquids, then soup and crackers, and gradually work up to solid foods.   3) Please notify your doctor immediately if you have any unusual bleeding, trouble breathing, redness and pain at the surgery site, drainage, fever above 101.0, or pain not relieved by medication.

## 2015-09-08 ENCOUNTER — Ambulatory Visit: Payer: BLUE CROSS/BLUE SHIELD | Admitting: Family

## 2015-09-08 LAB — GLUCOSE, CAPILLARY: Glucose-Capillary: 161 mg/dL — ABNORMAL HIGH (ref 65–99)

## 2015-09-09 ENCOUNTER — Telehealth: Payer: Self-pay | Admitting: Radiology

## 2015-09-09 NOTE — Telephone Encounter (Signed)
Called to notify pt of surgery scheduled 09/26/15. Pt states he feels that the ditropan is causing dizziness & he has a sleep study scheduled for 09/28/15. Per Dr Erlene Quan pt was advised to stop taking ditropan if it is making him dizzy & there should be no interference between the surgery and sleep study. Pt states he will r/s the sleep study for his own comfort & voices understanding.

## 2015-09-13 NOTE — Telephone Encounter (Signed)
Notified pt of pre-admit testing phone interview scheduled 09/19/15 between 1-5pm & to hold Eliquis 24 hrs prior to surgery. Pt voices understanding.

## 2015-09-19 ENCOUNTER — Inpatient Hospital Stay: Admission: RE | Admit: 2015-09-19 | Payer: BLUE CROSS/BLUE SHIELD | Source: Ambulatory Visit

## 2015-09-19 LAB — STONE ANALYSIS
Ca Oxalate,Monohydr.: 90 %
Ca phos cry stone ql IR: 5 %
STONE WEIGHT KSTONE: 78 mg

## 2015-09-19 NOTE — Pre-Procedure Instructions (Signed)
Kelly Margulis  ECHO COMPLETE WO IMAGE ENHANCING AGENT  Order# [id number]   Reading physician: Wellington Hampshire, MD  Ordering physician: Demetrios Loll, MD  Study date: 07/09/15      Study Result    Result status: Final result     *[place], [place] [id number]  [id number]  ------------------------------------------------------------------- Transthoracic Echocardiography  Patient: [name], [name] MR #: [id number] Study Date: 07/09/2015 Gender: M Age: 65 Height: 182.9 cm Weight: 137.2 kg BSA: 2.7 m^2 Pt. Status: Room: 245A  ADMITTING Mahalia Longest, Qing Lynnell Jude, [place] ATTENDING [facility] SONOGRAPHER Tikeshia Stills RDCS PERFORMING [facility], [facility]  cc:  ------------------------------------------------------------------- LV EF: 15% - 20%  ------------------------------------------------------------------- Indications: Dysnea 786.09.  ------------------------------------------------------------------- Study Conclusions  - Procedure narrative: Transthoracic echocardiography. Image  quality was poor. The study was technically difficult, as a  result of poor acoustic windows and poor sound wave transmission. - Left ventricle: The cavity size was moderately dilated. There was  mild concentric hypertrophy. Systolic function was severely  reduced. The estimated ejection fraction was in the range of 15%  to 20%. Possible akinesis of the anteroseptal, anterior, and  anterolateral myocardium. The study is not technically sufficient  to allow evaluation of LV diastolic function. - Mitral valve: There was moderate regurgitation. - Left atrium: The atrium was moderately dilated. - Right ventricle: The cavity size was moderately dilated. Wall   thickness was normal. Systolic function was moderately reduced. - Right atrium: The atrium was mildly dilated. - Pulmonary arteries: Systolic pressure was moderately increased.  PA peak pressure: 50 mm Hg (S). - Inferior vena cava: The vessel was severely dilated.  Respirophasic changes in dimension were absent, severely elevated  central venous pressure.  Transthoracic echocardiography. M-mode, complete 2D, spectral Doppler, and color Doppler. Birthdate: Patient birthdate: 1950/12/23. Age: Patient is 65 yr old. Sex: Gender: male. BMI: 41 kg/m^2. Blood pressure: 137/95 Patient status: Inpatient. Study date: Study date: 07/09/2015. Study time: 11:05 AM.  -------------------------------------------------------------------  ------------------------------------------------------------------- Left ventricle: The cavity size was moderately dilated. There was mild concentric hypertrophy. Systolic function was severely reduced. The estimated ejection fraction was in the range of 15% to 20%. Regional wall motion abnormalities: Possible akinesis of the anteroseptal, anterior, and anterolateral myocardium. The study is not technically sufficient to allow evaluation of LV diastolic function.  ------------------------------------------------------------------- Aortic valve: Trileaflet; normal thickness leaflets. Mobility was not restricted. Doppler: Transvalvular velocity was within the normal range. There was no stenosis. There was no regurgitation. Peak velocity ratio of LVOT to aortic valve: 0.57. Valve area (Vmax): 2.56 cm^2. Indexed valve area (Vmax): 0.95 cm^2/m^2. Peak gradient (S): 4 mm Hg.  ------------------------------------------------------------------- Aorta: Aortic root: The aortic root was normal in size.  ------------------------------------------------------------------- Mitral valve: Structurally normal valve. Mobility was  not restricted. Doppler: Transvalvular velocity was within the normal range. There was no evidence for stenosis. There was moderate regurgitation.  ------------------------------------------------------------------- Left atrium: The atrium was moderately dilated.  ------------------------------------------------------------------- Right ventricle: The cavity size was moderately dilated. Wall thickness was normal. Systolic function was moderately reduced.  ------------------------------------------------------------------- Pulmonic valve: Doppler: Transvalvular velocity was within the normal range. There was no evidence for stenosis.  ------------------------------------------------------------------- Tricuspid valve: Structurally normal valve. Doppler: Transvalvular velocity was within the normal range. There was mild regurgitation.  ------------------------------------------------------------------- Pulmonary artery: The main pulmonary artery was normal-sized. Systolic pressure was moderately increased.  ------------------------------------------------------------------- Right atrium: The atrium was mildly dilated.  ------------------------------------------------------------------- Pericardium: There was no pericardial effusion.  ------------------------------------------------------------------- Systemic veins: Inferior  vena cava: The vessel was severely dilated. Respirophasic changes in dimension were absent, severely elevated central venous pressure.  ------------------------------------------------------------------- Measurements  Left ventricle Value Reference LV ID, ED, PLAX chordal (H) 62.3 mm 43 - 52 LV ID, ES, PLAX chordal (H) 55.5 mm 23 - 38 LV fx shortening, PLAX chordal (L) 11 % >=29 LV PW thickness, ED 10.6  mm --------- IVS/LV PW ratio, ED 1.18 <=1.3  Ventricular septum Value Reference IVS thickness, ED 12.5 mm ---------  LVOT Value Reference LVOT ID, S 24 mm --------- LVOT area 4.52 cm^2 --------- LVOT peak velocity, S 57.7 cm/s ---------  Aortic valve Value Reference Aortic valve peak velocity, S 102 cm/s --------- Aortic peak gradient, S 4 mm Hg --------- Velocity ratio, peak, LVOT/AV 0.57 --------- Aortic valve area, peak velocity 2.56 cm^2 --------- Aortic valve area/bsa, peak 0.95 cm^2/m^2 --------- velocity  Aorta Value Reference Aortic root ID, ED 32 mm --------- Ascending aorta ID, A-P, S 34 mm ---------  Left atrium Value Reference LA ID, A-P, ES 50 mm --------- LA ID/bsa, A-P 1.85 cm/m^2 <=2.2 LA volume, S 91.7 ml --------- LA volume/bsa, S 34 ml/m^2 --------- LA volume, ES, 1-p A4C 114 ml --------- LA volume/bsa, ES, 1-p A4C 42.2 ml/m^2 --------- LA volume, ES, 1-p A2C 69.4 ml --------- LA volume/bsa, ES, 1-p A2C 25.7 ml/m^2 ---------  Pulmonary arteries Value Reference PA pressure, S, DP (H) 50 mm Hg <=30  Tricuspid  valve Value Reference Tricuspid maximal inflow 281 cm/s --------- velocity, PISA  Legend: (L) and (H) mark values outside specified reference range.  ------------------------------------------------------------------- Prepared and Electronically Authenticated by  Kathlyn Sacramento, MD 2017-02-04T13:29:39    PACS Images    Show images for Echocardiogram    Patient Information    Patient Name Sex DOB SSN   [name], [name] Male 05-07-1951 [id number]    Reason For Exam  Priority: Routine   Dyspnea 786.09 / R06.00    Procedures Performed    Code Procedure Name   [id number] ECHO COMPLETE WO IMAGING ENHANCING AGENT    Performing Technologist/Nurse    Performing Technologist/Nurse:       Order-Level Documents - 07/08/15:      Scan on 08/02/2015 9:33 AM by Provider Default, MDScan on 08/02/2015 9:33 AM by Provider Default, MD    Encounter-Level Documents - 07/08/2015:      Scan on 08/02/2015 10:14 AM by Provider Default, MDScan on 08/02/2015 10:14 AM by Provider Default, MD     Scan on 08/02/2015 9:49 AM by Provider Default, MDScan on 08/02/2015 9:49 AM by Provider Default, MD     Scan on 08/02/2015 9:32 AM by Provider Default, MDScan on 08/02/2015 9:32 AM by Provider Default, MD     Scan on 08/02/2015 8:48 AM by Provider Default, MDScan on 08/02/2015 8:48 AM by Provider Default, MD     Scan on 07/25/2015 8:33 AM by Provider Default, MDScan on 07/25/2015 8:33 AM by Provider Default, MD     Scan on 07/24/2015 7:19 PM by Provider Default, MDScan on 07/24/2015 7:19 PM by Provider Default, MD     Electronic signature on 07/08/2015 8:56 PM    Signed    Electronically signed by Wellington Hampshire, MD on 07/09/15 at 1329 EST    Printable Result Report    Result Report    Echocardiogram (Order [id number])  Echocardiography  Order: [id number]   Released By: Tad Moore, RN (auto-released)   Authorizing: Demetrios Loll, MD   Date: 07/08/2015  Department: [facility] (2A)  Procedure Abnormality Status    Echocardiogram      Order Information     Order Date/Time Release Date/Time Start Date/Time End Date/Time    07/08/15 10:28 PM 07/08/15 10:28 PM 07/08/15 10:28 PM 07/08/15 10:28 PM      Order Details     Frequency Duration Priority Order Class    Once 1 occurrence Routine Hospital Performed      Acc#    [id number]      Order Questions     Question Answer Comment    Complete or Limited study? Complete     With Image Enhancing Agent or without Image Enhancing Agent? With Image Enhancing Agent     Note:  Select Without Image Enhancing Agent only if contraindicated.    Reason for exam-Echo Dyspnea 786.09 / R06.00       Authorizing Provider [address]     Date/Time Authorizing Provider Changed by    07/08/2015 10:28 PM Demetrios Loll, MD Demetrios Loll, MD      Order Requisition     Echocardiogram [phone]) on 07/08/15     Collection Information     Collected: 07/09/2015 11:05 AM   Resulting Agency: [facility]     Echocardiogram (Order [id number]) on 07/08/15

## 2015-09-19 NOTE — Pre-Procedure Instructions (Signed)
Jim Bushy, MD Signed Jim Bushy, MD 08/15/2015 4:21 PM    Progress Notes    Expand All Collapse All      Cardiology Office Note   Date: 08/15/2015   ID: [name], DOB 05-19-1951, MRN [id number]  Referring Doctor: Elisabeth Cara, NP  Cardiologist: Jim Bushy, MD   Reason for consultation:  Chief Complaint  Patient presents with  . other    S/p cardiac cath pt needs cardiac clearance for kidney stone removal. Meds reviewed verbally with pt.     History of Present Illness: [name] is a 65 y.o. male who presents for atrial fibrillation, congestive heart failure  In terms of atrial fibrillation, as far as he is concerned, heart rate is controlled. He does not have any symptoms of palpitations from this. He continues to take the anticoagulant. He has no significant bleeding issues.  In terms of congestive heart failure, he has been aware of a weakened heart for many years now. He was told that he had a heart attack but he does not recall an event in the past where he had significant chest pain. However he is aware that his heart has been weak for quite some time now. He has been more privileges with taking his medications and is addressing lifestyle issues -- dietary changes, sodium restriction.He feels that his weight has been stable at home. Blood pressure is in the 123XX123 systolic.  Patient is likely class III in terms of functional capacity for CHF. He denies chest pain. No shortness of breath at rest. No palpitations. No loss of consciousness. No syncope.     ROS: Please see the history of present illness. Aside from mentioned under HPI, all other systems are reviewed and negative.     Past Medical History  Diagnosis Date  . Chronic combined systolic (congestive) and diastolic (congestive) heart failure (HCC)     a. EF 25% by cath in 2013 b. Echo in 07/2015 showing EF of 15-20%, moderate MR, moderate Pulm HTN, severely  dilated IVC  . Sleep apnea   . Diabetes mellitus type 2, uncontrolled ([facility])     a. A1c 11.0 in 07/2015.  [name] Renal disorder     kidney stone  . Hypertension   . New onset atrial fibrillation ([facility])     a. diagnosed in 07/2015 b. started on Eliquis  . CKD (chronic kidney disease) stage 3, GFR 30-59 ml/min   . Coronary artery disease     Past Surgical History  Procedure Laterality Date  . Kidney surgery    . Coronary angioplasty with stent placement    . Cardiac catheterization N/A 07/25/2015    Procedure: Right/Left Heart Cath and Coronary Angiography; Surgeon: Wellington Hampshire, MD; Location: [facility]; Service: Cardiovascular; Laterality: N/A;  . Cardiac catheterization      [place]  . Cardiac catheterization      [facility], [place]     reports that he has never smoked. He has never used smokeless tobacco. He reports that he drinks alcohol. He reports that he does not use illicit drugs.   family history includes Heart attack in his brother; Heart failure in his father.   Current Outpatient Prescriptions  Medication Sig Dispense Refill  . ALPRAZolam (XANAX) 0.25 MG tablet Take 1 tablet (0.25 mg total) by mouth every 8 (eight) hours as needed for anxiety. 30 tablet 0  . amiodarone (PACERONE) 200 MG tablet Take 1 tablet (200 mg total) by mouth daily. 30 tablet 0  .  apixaban (ELIQUIS) 5 MG TABS tablet Take 1 tablet (5 mg total) by mouth 2 (two) times daily. 60 tablet 0  . atorvastatin (LIPITOR) 40 MG tablet Take 1 tablet (40 mg total) by mouth daily at 6 PM. 30 tablet 0  . b complex vitamins tablet Take 1 tablet by mouth daily.    . carvedilol (COREG) 3.125 MG tablet Take 1 tablet (3.125 mg total) by mouth 2 (two) times daily with a meal. 60 tablet 0  . cholecalciferol (VITAMIN D) 1000 units tablet Take 2,000 Units by mouth daily.    . Coenzyme Q10 (CO Q 10  PO) Take 400 mg by mouth daily.    . Cranberry 500 MG CAPS Take 1,000 mg by mouth daily.    . furosemide (LASIX) 80 MG tablet Take 1 tablet (80 mg total) by mouth 2 (two) times daily. 60 tablet 0  . glipiZIDE (GLUCOTROL) 10 MG tablet Take 20-30 mg by mouth 2 (two) times daily. Pt takes three tablets in the morning and two tablets at bedtime.    [name] Hawthorne Berry 550 MG CAPS Take 550 mg by mouth daily.    . metFORMIN (GLUCOPHAGE) 500 MG tablet Take 1,000 mg by mouth 2 (two) times daily with a meal.    . metolazone (ZAROXOLYN) 2.5 MG tablet Take 1 tablet (2.5 mg total) by mouth daily. 30 tablet 0  . multivitamin-lutein (OCUVITE-LUTEIN) CAPS capsule Take 1 capsule by mouth 2 (two) times daily.    Jonna Coup Leaf Extract 250 MG CAPS Take 250 mg by mouth 2 (two) times daily.    [name] OVER THE COUNTER MEDICATION Take 3 capsules by mouth 2 (two) times daily. Pt takes beet root.    [name] OVER THE COUNTER MEDICATION Take 1 capsule by mouth daily. Pt takes caprylic acid.    [name] oxyCODONE-acetaminophen (PERCOCET/ROXICET) 5-325 MG tablet Take 1 tablet by mouth every 4 (four) hours as needed for moderate pain. 30 tablet 0  . polyethylene glycol (MIRALAX / GLYCOLAX) packet Take 17 g by mouth daily as needed for mild constipation.    . potassium phosphate, monobasic, (K-PHOS ORIGINAL) 500 MG tablet Take 500 mg by mouth 2 (two) times daily with a meal.    . spironolactone (ALDACTONE) 25 MG tablet Take 25 mg by mouth 2 (two) times daily.     . TURMERIC CURCUMIN PO Take 1 capsule by mouth daily.    . vitamin C (ASCORBIC ACID) 500 MG tablet Take 1,000 mg by mouth daily.    . vitamin E 400 UNIT capsule Take 400 Units by mouth daily.    . Wheat Dextrin (BENEFIBER) CHEW Chew 1 tablet by mouth daily as needed.     No current facility-administered medications for this visit.    Allergies: Review of patient's allergies indicates no  known allergies.    PHYSICAL EXAM: VS: BP 88/68 mmHg  Pulse 85  Ht 6' (1.829 m)  Wt 270 lb 8 oz (122.698 kg)  BMI 36.68 kg/m2 , Body mass index is 36.68 kg/(m^2). Wt Readings from Last 3 Encounters:  08/15/15 270 lb 8 oz (122.698 kg)  08/08/15 270 lb (122.471 kg)  08/01/15 294 lb 8 oz (133.584 kg)    GENERAL: well developed, well nourished, obese, not in acute distress HEENT: normocephalic, pink conjunctivae, anicteric sclerae, no xanthelasma, normal dentition, oropharynx clear NECK: no neck vein engorgement, JVP normal, no hepatojugular reflux, carotid upstroke brisk and symmetric, no bruit, no thyromegaly, no lymphadenopathy LUNGS: good respiratory effort, clear to auscultation bilaterally  CV: PMI not displaced, no thrills, no lifts, S2 within normal limits, no palpable S3 or S4, no murmurs, no rubs, no gallops ABD: Soft, nontender, distended, normoactive bowel sounds, no abdominal aortic bruit, no hepatomegaly, no splenomegaly MS: nontender back, no kyphosis, no scoliosis, no joint deformities EXT: 2+ DP/PT pulses, +1 edema, no varicosities, no cyanosis, no clubbing SKIN: warm, nondiaphoretic, normal turgor, no ulcers NEUROPSYCH: alert, oriented to person, place, and time, sensory/motor grossly intact, normal mood, appropriate affect  Recent Labs: 07/09/2015: B Natriuretic Peptide 1891.0* 07/29/2015: Magnesium 2.0 07/31/2015: Hemoglobin 11.6*; Platelets 183 08/08/2015: BUN 46*; Creatinine, Ser 1.74*; Potassium 3.7; Sodium 135   Lipid Panel  Labs (Brief)       Component Value Date/Time   CHOL 153 07/09/2015 0455   TRIG 168* 07/09/2015 0455   HDL 34* 07/09/2015 0455   CHOLHDL 4.5 07/09/2015 0455   VLDL 34 07/09/2015 0455   LDLCALC 85 07/09/2015 0455      Other studies Reviewed:  EKG: EKG ordered today.08/15/2015  The ekg ordered today was personally reviewed by me and it reveals sinus rhythm first degree AV block. 85 BPM.  LAFB. Cannot rule out anterior infarct.  Additional studies/ records that were reviewed personally reviewed by me today include:  Echocardiogram 07/09/2015: LV moderately dilated. Mild concentric hypertrophy. EF 15-20%. Possible Akinesis of anterior septal, anterior, anterolateral myocardium. Moderate MR. Left atrium moderately dilated. RV moderately dilated. Systolic function moderately reduced. R a mildly dilated. IVC severely dilated.  Right and left heart cath 07/25/2015, Dr. Fletcher Anon: Proximal LAD to mid LAD lesion, 60% stenosed. Previously treated with a stent. Distal RCA 60% stenosed. Ostial second diagonal to second diagonal, 70% stenosed. Moderately elevated filling pressures with moderate pulmonary hypertension. Hemodynamic features suggestive of restrictive physiology Moderate two-vessel CAD. Patent proximal LAD stent with 60% in-stent restenosis. LAD and RCA moderately calcified. PA pressure: 56/34 with a mean of 42, LV pressure: 95/15 with left ventricular end-diastolic pressure of 26. Cardiac output was 4.86 with a cardiac index of 1.9.   ASSESSMENT AND PLAN:  congestive heart failure, systolic, chronic CHF, not in acute decompensation, EF 15-20% Ischemic cardiomyopathy Biventricular congestive heart failure Appears euvolemic. Weight has been stable. Commended on following sodium restriction, lifestyle changes. Blood pressure is borderline. Patient otherwise asymptomatic. We'll be unable to up titrate medications at this time. But will continue current dose of carvedilol. Continue Aldactone for now. Will not be able to start entresto or even acei/arb due to relatively low blood pressure .  Continue furosemide and metolazone for now. Last BMP was improved compared to BMP from hospital. Patient follows up with congestive heart failure clinic. Recommend to check BMP and fasting lipid panel in 1-2 weeks. Referred to electrophysiology for evaluation for ICD for primary prevention of SCD     Pulmonary hypertension, pulmonary venous, secondary to CHF Likely improve with diuresis. Continue to monitor  Coronary artery disease, moderate two-vessel CAD, no high-grade stenosis on last heart catheterization 07/25/2015.  No further investigation at this point prior to planned urologic procedure. No chest pain. Consider aspirin 81 mg by mouth daily after urologic procedure. Continue statin therapy. LDL goal less than 70.  Atrial fibrillation, likely paroxysmal, on antiarrhythmic medication, on anticoagulation, currently in sinus rhythm Continue antiarrhythmic medication for now. Continue low-dose carvedilol.  Patient tolerating anticoagulation. Continue eliquis for now. May hold 24 hours prior to planned urologic procedure. Resume as soon as okay with urology.   Mitral regurgitation, moderate Likely related to CHF, dilated cardiomyopathy. Should improve with  better volume status and blood pressure control. Continue to monitor   First-degree AV block on low-dose beta blockade. Continue to monitor  Hypertension continue blood pressure log continue current meds   Hyperlipidemia continue statin therapy. LDL goal less than 70   Obesity Body mass index is 36.68 kg/(m^2).[name] Recommend aggressive weight loss through diet and increased physical activity.   Sleep apnea Follow-up with PCP regarding CPAP    Current medicines are reviewed at length with the patient today. The patient does not have concerns regarding medicines.  Labs/ tests ordered today include:  Orders Placed This Encounter  Procedures  . Basic Metabolic Panel (BMET)  . Lipid Profile  . Ambulatory referral to Cardiac Electrophysiology  . EKG 12-Lead    I had a lengthy and detailed discussion with the patient regarding diagnoses, prognosis, diagnostic options, treatment options, and side effects of medications.   I counseled the patient on importance of lifestyle modification including heart  healthy diet, regular physical activity.  Disposition: FU with undersigned In one month  I spent at least 40 minutes with the patient today and more than 50% of the time was spent counseling the patient and coordinating care. Patient is high risk due to multiple comorbidities.    Signed, Jim Bushy, MD  08/15/2015 4:20 PM  [facility]

## 2015-09-19 NOTE — Pre-Procedure Instructions (Signed)
Progress Notes   [name] (MR# [id number])      Progress Notes Info    Author Note Status Last Update User Last Update Date/Time   Deboraha Sprang, MD Signed Deboraha Sprang, MD 08/22/2015 5:21 PM    Progress Notes    Expand All Collapse All        ELECTROPHYSIOLOGY CONSULT NOTE  Patient ID: [name], MRN: [id number], DOB/AGE: 1950-07-23 65 y.o. Admit date: (Not on file) Date of Consult: 08/22/2015  Primary Physician: Elisabeth Cara, NP Primary Cardiologist: AI Consulting Physician AI  Chief Complaint: ICD   HPI [name] is a 65 y.o. male  Seen for consideration of an ICD.  I had seen him in hospital 2/17 where he had been admitted for acute on chronic congestive heart failure. to [facility]. Echocardiogram demonstrated ejection fraction of 15% with a congestive heart failure symptoms. He required inotropic therapy. He also had atrial fibrillation and was started on amiodarone and apixaban.  Cath >> Dist RCA lesion, 60% stenosed.  Prox LAD to Mid LAD lesion, 60% stenosed. The lesion was previously treated with a stent (unknown type) greater than two years ago.  Ost 2nd Diag to 2nd Diag lesion, 70% stenosed.  1. Moderately elevated filling pressures with moderate pulmonary hypertension. Mildly reduced cardiac output. Hemodynamic features are suggestive of restrictive physiology. 2. Moderate two-vessel coronary artery disease. Patent proximal LAD stent with 60% in-stent restenosis. The LAD and RCA are moderately calcified  On arrival to the hospital he was on Aldactone and hydrochlorothiazide and low-dose beta-blockade; he was not on ace/ARB. [name] modest renal insufficiency with a creatinine of 1.48 ECG 3/17 demonstrated sinus rhythm first degree AV block and a QRS duration about 120 ms     Past Medical History  Diagnosis Date  . Chronic combined systolic (congestive) and diastolic (congestive) heart failure (HCC)     a. EF 25% by cath in 2013 b.  Echo in 07/2015 showing EF of 15-20%, moderate MR, moderate Pulm HTN, severely dilated IVC  . Sleep apnea   . Diabetes mellitus type 2, uncontrolled ([place])     a. A1c 11.0 in 07/2015.  [name] Renal disorder     kidney stone  . Hypertension   . New onset atrial fibrillation ([place])     a. diagnosed in 07/2015 b. started on Eliquis  . CKD (chronic kidney disease) stage 3, GFR 30-59 ml/min   . Coronary artery disease   . Paroxysmal atrial fibrillation [facility])      Surgical History:  Past Surgical History  Procedure Laterality Date  . Kidney surgery    . Coronary angioplasty with stent placement    . Cardiac catheterization N/A 07/25/2015    Procedure: Right/Left Heart Cath and Coronary Angiography; Surgeon: Wellington Hampshire, MD; Location: [place] [facility]; Service: Cardiovascular; Laterality: N/A;  . Cardiac catheterization      [place]  . Cardiac catheterization      [facility], [place]     Home Meds: Prior to Admission medications   Medication Sig Start Date End Date Taking? Authorizing Provider  ALPRAZolam (XANAX) 0.25 MG tablet Take 1 tablet (0.25 mg total) by mouth every 8 (eight) hours as needed for anxiety. 08/01/15  Yes Lytle Butte, MD  amiodarone (PACERONE) 200 MG tablet Take 1 tablet (200 mg total) by mouth daily. 08/01/15  Yes Lytle Butte, MD  apixaban (ELIQUIS) 5 MG TABS tablet Take 1 tablet (5 mg total) by mouth 2 (two) times daily. 08/01/15  Yes Lytle Butte, MD  atorvastatin (LIPITOR) 40 MG tablet Take 1 tablet (40 mg total) by mouth daily at 6 PM. 08/01/15  Yes Lytle Butte, MD  b complex vitamins tablet Take 1 tablet by mouth daily.   Yes Historical Provider, MD  carvedilol (COREG) 3.125 MG tablet Take 1 tablet (3.125 mg total) by mouth 2 (two) times daily with a meal. 08/01/15  Yes Lytle Butte, MD  cholecalciferol (VITAMIN D) 1000 units tablet Take  2,000 Units by mouth daily.   Yes Historical Provider, MD  Coenzyme Q10 (CO Q 10 PO) Take 400 mg by mouth daily.   Yes Historical Provider, MD  Cranberry 500 MG CAPS Take 1,000 mg by mouth daily.   Yes Historical Provider, MD  furosemide (LASIX) 80 MG tablet Take 1 tablet (80 mg total) by mouth 2 (two) times daily. 08/01/15  Yes Lytle Butte, MD  glipiZIDE (GLUCOTROL) 10 MG tablet Take 20-30 mg by mouth 2 (two) times daily. Pt takes three tablets in the morning and two tablets at bedtime.   Yes Historical Provider, MD  Cathrine Muster 550 MG CAPS Take 550 mg by mouth daily.   Yes Historical Provider, MD  metFORMIN (GLUCOPHAGE) 500 MG tablet Take 1,000 mg by mouth 2 (two) times daily with a meal.   Yes Historical Provider, MD  metolazone (ZAROXOLYN) 2.5 MG tablet Take 1 tablet (2.5 mg total) by mouth daily. 08/01/15  Yes Lytle Butte, MD  multivitamin-lutein [facility]) CAPS capsule Take 1 capsule by mouth 2 (two) times daily.   Yes Historical Provider, MD  Olive Leaf Extract 250 MG CAPS Take 250 mg by mouth 2 (two) times daily.   Yes Historical Provider, MD  OVER THE COUNTER MEDICATION Take 3 capsules by mouth 2 (two) times daily. Pt takes beet root.   Yes Historical Provider, MD  OVER THE COUNTER MEDICATION Take 1 capsule by mouth daily. Pt takes caprylic acid.   Yes Historical Provider, MD  oxyCODONE-acetaminophen (PERCOCET/ROXICET) 5-325 MG tablet Take 1 tablet by mouth every 4 (four) hours as needed for moderate pain. 08/01/15  Yes Lytle Butte, MD  polyethylene glycol [facility] / GLYCOLAX) packet Take 17 g by mouth daily as needed for mild constipation.   Yes Historical Provider, MD  potassium phosphate, monobasic, (K-PHOS ORIGINAL) 500 MG tablet Take 500 mg by mouth 2 (two) times daily with a meal.   Yes Historical Provider, MD  spironolactone (ALDACTONE) 25 MG tablet Take 25 mg by mouth 2 (two) times  daily.    Yes Historical Provider, MD  TURMERIC CURCUMIN PO Take 1 capsule by mouth daily.   Yes Historical Provider, MD  vitamin C (ASCORBIC ACID) 500 MG tablet Take 1,000 mg by mouth daily.   Yes Historical Provider, MD  vitamin E 400 UNIT capsule Take 400 Units by mouth daily.   Yes Historical Provider, MD  Wheat Dextrin (BENEFIBER) CHEW Chew 1 tablet by mouth daily as needed.   Yes Historical Provider, MD    Allergies: No Known Allergies  Social History   Social History  . Marital Status: Single    Spouse Name: N/A  . Number of Children: N/A  . Years of Education: N/A   Occupational History  . Not on file.   Social History Main Topics  . Smoking status: Never Smoker   . Smokeless tobacco: Never Used  . Alcohol Use: 0.0 oz/week    0 Standard drinks or equivalent per week     Comment:  occasionally  . Drug Use: No     Comment: Past  . Sexual Activity: Not on file   Other Topics Concern  . Not on file   Social History Narrative     Family History  Problem Relation Age of Onset  . Heart failure Father   . Heart attack Brother      ROS: Please see the history of present illness. All other systems reviewed and negative.    Physical Exam: Blood pressure 126/83, pulse 90, height 6' (1.829 m), weight 262 lb (118.842 kg). General: Well developed,Morbidly obese male in no acute distress. Head: Normocephalic, atraumatic, sclera non-icteric, no xanthomas, nares are without discharge. EENT: normal  Lymph Nodes: none Neck: Negative for carotid bruits. JVD not elevated. Back:without scoliosis kyphosis  Lungs: Clear bilaterally to auscultation without wheezes, rales, or rhonchi. Breathing is unlabored. Heart: RRR with S1 S2. No murmur . No rubs, or gallops appreciated. Abdomen: Soft, non-tender, non-distended with normoactive bowel sounds. No hepatomegaly. No  rebound/guarding. No obvious abdominal masses. Msk: Strength and tone appear normal for age. Extremities: No clubbing or cyanosis. 2+ edema. Distal pedal pulses are 2+ and equal bilaterally. Skin: Warm and Dry Neuro: Alert and oriented X 3. CN III-XII intact Grossly normal sensory and motor function . Psych: Responds to questions appropriately with a normal affect.     Labs: Cardiac Enzymes  Recent Labs (last 2 labs)     No results for input(s): CKTOTAL, CKMB, TROPONINI in the last 72 hours.   CBC  Recent Labs    Lab Results  Component Value Date   WBC 6.6 07/31/2015   HGB 11.6* 07/31/2015   HCT 34.3* 07/31/2015   MCV 93.4 07/31/2015   PLT 183 07/31/2015     PROTIME:  Recent Labs (last 2 labs)     No results for input(s): LABPROT, INR in the last 72 hours.    Last Labs     Chemistry No results for input(s): NA, K, CL, CO2, BUN, CREATININE, CALCIUM, PROT, BILITOT, ALKPHOS, ALT, AST, GLUCOSE in the last 168 hours.  Invalid input(s): LABALBU   Lipids  Recent Labs    Lab Results  Component Value Date   CHOL 153 07/09/2015   HDL 34* 07/09/2015   LDLCALC 85 07/09/2015   TRIG 168* 07/09/2015     BNP  Last Labs    No results found for: PROBNP   Thyroid Function Tests:  Recent Labs (last 2 labs)     No results for input(s): TSH, T4TOTAL, T3FREE, THYROIDAB in the last 72 hours.  Invalid input(s): FREET3   Miscellaneous  Recent Labs    No results found for: DDIMER    Radiology/Studies:   Imaging Results    [place] Intraoperative  07/29/2015 CLINICAL DATA: Ultrasound was provided for use by the ordering physician, and a technical charge was applied by the performing facility. No radiologist interpretation/professional services rendered.   US Renal  07/24/2015 CLINICAL DATA: Left-sided kidney stone. Difficulty urinating. EXAM: RENAL / URINARY TRACT ULTRASOUND COMPLETE COMPARISON: Radiographs today.  FINDINGS: Right Kidney: Length: 11.9 cm. Echogenicity within normal limits. No mass or hydronephrosis visualized. Left Kidney: Length: 14.1 cm. The left kidney is suboptimally visualized, partly obscured by bowel gas. There is a probable cyst in the interpolar region measuring approximately 4.1 x 3.6 x 4.1 cm. The left renal pelvis appears mildly distended. The calcifications demonstrated on the radiographs are not well seen, probably within the ureter. Bladder: Appears normal for degree of bladder distention. Ureteral jets  were not visualized. Pelvic ascites noted. IMPRESSION: 1. Suboptimal visualization of the left kidney due to bowel gas. The left renal pelvis appears dilated, suggesting obstruction of the left ureter by the calcifications demonstrated on radiographs. 2. Left renal cyst. 3. Pelvic ascites. 4. CT suggested for further evaluation. Electronically Signed By: Richardean Sale M.D. On: 07/24/2015 19:32   Dg Abd 2 Views  07/24/2015 CLINICAL DATA: Left kidney stone. Difficulty urinating. EXAM: ABDOMEN - 2 VIEW COMPARISON: None. FINDINGS: Normal bowel gas pattern without free peritoneal air. Adjacent 2.8 x 1.2 cm and 1.4 x 0.9 cm oval calcifications in the expected position of the proximal to mid left ureter. Mild lumbar spine degenerative changes. IMPRESSION: 2.8 cm and 1.4 cm probable left ureteral calculi. Calcified lymph nodes are less likely. Electronically Signed By: Claudie Revering M.D. On: 07/24/2015 12:53   Ct Renal Stone Study  07/25/2015 CLINICAL DATA: Left flank pain. History kidney stones. EXAM: CT ABDOMEN AND PELVIS WITHOUT CONTRAST TECHNIQUE: Multidetector CT imaging of the abdomen and pelvis was performed following the standard protocol without IV contrast. COMPARISON: Renal ultrasound and abdominal radiographs 1 day prior. FINDINGS: Lower chest: Heart mildly enlarged. Coronary artery calcifications are seen. There is a small left pleural effusion. Liver: Enlarged with  diffusely decreased density consistent with steatosis. Hepatobiliary: Gallbladder physiologically distended, no calcified stone. No biliary dilatation. Pancreas: No ductal dilatation or inflammation. Spleen: Normal in size measuring 11.6 cm greatest dimension. Adrenal glands: No nodule. Kidneys: There are 2 large stones in the mid proximal left ureter. More proximal stone measures 1.3 x 1.1 x 2.2 cm, with an adjacent stone measuring 2.5 x 1.6 x 1.4 cm. Moderate proximal hydroureteronephrosis with increased density of the urine in the renal collecting system, suggestive of blood. Ureter distal to this is decompressed, however is an additional 0.9 x 1.0 x 0.6 cm stone in the left distal ureter just proximal to the ureterovesicular junction. There is thinning of the left renal parenchyma. Small nonobstructing stone in the lower left kidney. Cyst in the interpolar left kidney measures 3.9 cm. No stones or obstructive uropathy of the right kidney. Right ureter is decompressed. Stomach/Bowel: Limited bowel assessment secondary to lack of enteric contrast, intra-abdominal ascites, and soft tissue attenuation from body habitus. Stomach physiologically distended with ingested contents. There are no dilated or thickened small bowel loops. Small volume of stool throughout the colon without colonic wall thickening. The appendix is not confidently identified. Vascular/Lymphatic: No retroperitoneal adenopathy. Abdominal aorta is normal in caliber moderate aortic atherosclerosis without aneurysm. Reproductive: Prostatic calcifications. Bladder: Decompressed, probable intraluminal blood. Other: Moderate volume of intra-abdominal and pelvic ascites. Free fluid in the mesenteric, pelvis, both pericolic gutters in both upper quadrants. Mild mesenteric and whole body wall edema. No free air. No loculated intra-abdominal fluid collection. Musculoskeletal: There are no acute or suspicious osseous abnormalities. Facet arthropathy in the  lower lumbar spine. IMPRESSION: 1. Left hydroureteronephrosis with 2 large stones in the mid proximal ureter measuring 2.2 and 2.5 cm. Ureter distal to this is decompressed, however there is an additional 1 cm stone in the distal left ureter, just proximal to the ureterovesicular junction. Increased density of the urine in the bladder and left renal collecting system consistent with hemorrhage. 2. Moderate volume intra-abdominal ascites. There is hepatic steatosis. 3. Atherosclerosis, including coronary artery calcifications. Cardiomegaly and small left effusion in the included lung bases. Electronically Signed By: Jeb Levering M.D. On: 07/25/2015 19:45   Ir Nephroureteral Cath Place Left  07/29/2015 INDICATION: 65 year old male with a history  of left-sided nephrolithiasis and obstructive hydronephrosis. He has been referred for evaluation of percutaneous nephrostomy. EXAM: IR NEPHROURETERAL CATH PLACEMENT RIGHT COMPARISON: CT 07/25/2015 MEDICATIONS: None ANESTHESIA/SEDATION: Fentanyl 100 mcg IV; Versed 4.0 mg IV Moderate Sedation Time: 10 minutes The patient was continuously monitored during the procedure by the interventional radiology nurse under my direct supervision. CONTRAST: 58mL OMNIPAQUE IOHEXOL 300 MG/ML SOLN - administered into the collecting system(s) FLUOROSCOPY TIME: Fluoroscopy Time: 0 minutes 30 seconds COMPLICATIONS: None PROCEDURE: Informed written consent was obtained from the patient after a thorough discussion of the procedural risks, benefits and alternatives. All questions were addressed. Maximal Sterile Barrier Technique was utilized including caps, mask, sterile gowns, sterile gloves, sterile drape, hand hygiene and skin antiseptic. A timeout was performed prior to the initiation of the procedure. Patient positioned prone position on the fluoroscopy table. Ultrasound survey of the left flank was performed. Once the patient is prepped and draped in usual sterile fashion,  scout image of the abdomen was performed. The skin and subcutaneous tissues were generously infiltrated 1% lidocaine for local anesthesia. Using ultrasound guidance, a 22 gauge needle was placed into the collecting system in the inferior pole through a posterior calyx. With spontaneous urine returned, modified Seldinger technique was used to place a 10 [place] catheter after serial dilation of the tissue tract with 8 [place], 10 [place], 12 [place] catheters. The [place] did not accept the [employer] wire, and a 014 [place] wire was used for access. This 014 wire did not traverse the ureter, and could not be passed beyond the proximal stone. Once the 10 French catheter was in place, contrast was infused confirming position. Catheter was sutured in position attached to gravity drainage. Patient tolerated the procedure well and remained hemodynamically stable throughout. No complications were encountered and no significant blood loss encountered. IMPRESSION: Status post image guided left percutaneous nephrostomy tube. Sample was sent to the lab for analysis. An 014 wire could not be passed beyond the proximal stone in the ureter, likely secondary to current inflammation. Signed, Dulcy Fanny. Earleen Newport DO [facility] Radiology PLAN: Discussed with Dr. Erlene Quan. Plan for rendezvous procedure at the time of stone extraction. She is hopeful to place a wire through the percutaneous nephrostomy through the ureter for access from above. Electronically Signed By: Corrie Mckusick D.O. On: 07/29/2015 13:07     EKG: From 13 March sinus rhythm at 85 Intervals 22/12/42 Poor R-wave progression Left axis deviation  Assessment and Plan:   Ischemic cardiomyopathy   Congestive heart failure-chronic-systolic  Morbidly obese  Obstructive sleep apnea    The patient has significant left ventricular dysfunction. Prior to making a decision regarding ICD therapy, we  will try to augment his afterload reduction. We will add low-dose losartan and increase his carvedilol. Repeat LV assessment I will defer to Dr. Orland Dec if LV function remains impaired ICD for backup would be an appropriate consideration.  He also has significant sleep disordered breathing and daytime somnolence. He needs a sleep study.  Following the initiation of his losartan we'll arrange for his metabolic profile to be drawn next week.   we will see him as needed following reassessment by Dr. Koleen Nimrod

## 2015-09-20 NOTE — Patient Instructions (Signed)
  Your procedure is scheduled on: 09-26-15 Report to [place] To find out your arrival time please call [phone] between 1PM - 3PM on 09-23-15  Remember: Instructions that are not followed completely may result in serious medical risk, up to and including death, or upon the discretion of your surgeon and anesthesiologist your surgery may need to be rescheduled.    _X___ 1. Do not eat food or drink liquids after midnight. No gum chewing or hard candies.     _X___ 2. No Alcohol for 24 hours before or after surgery.   ____ 3. Bring all medications with you on the day of surgery if instructed.    ____ 4. Notify your doctor if there is any change in your medical condition     (cold, fever, infections).     Do not wear jewelry, make-up, hairpins, clips or nail polish.  Do not wear lotions, powders, or perfumes. You may wear deodorant.  Do not shave 48 hours prior to surgery. Men may shave face and neck.  Do not bring valuables to the hospital.    [facility] is not responsible for any belongings or valuables.               Contacts, dentures or bridgework may not be worn into surgery.  Leave your suitcase in the car. After surgery it may be brought to your room.  For patients admitted to the hospital, discharge time is determined by your treatment team.   Patients discharged the day of surgery will not be allowed to drive home.   Please read over the following fact sheets that you were given:    _X___ Take these medicines the morning of surgery with A SIP OF WATER:    1. AMIODARONE   2. XANAX  3. CARVEDILOL  4.  5.  6.  ____ Fleet Enema (as directed)   ____ Use CHG Soap as directed  ____ Use inhalers on the day of surgery  _X___ Stop metformin 2 days prior to surgery-LAST DOSE ON Friday 09-23-15    ____ Take 1/2 of usual insulin dose the night before surgery and none on the morning of surgery.   _X___ Stop Coumadin/Plavix/aspirin-PT TO STOP  ELOQUIS 24 HOURS PRIOR TO SURGERY PER AMY RN AT DR Audree Bane OFFICE (IN [facility])  ____ Stop Anti-inflammatories-NO NSAIDS OR ASA PRODUCTS-PERCOCET/VICODIN OK TO CONTINUE IF NEEDED   _X___ Stop supplements until after surgery-STOP ALL SUPPLEMENTS EXCEPT CAN CONTINUE VITAMIN D AND BENEFIBER  ____ Bring C-Pap to the hospital.

## 2015-09-21 ENCOUNTER — Encounter: Payer: Self-pay | Admitting: *Deleted

## 2015-09-21 DIAGNOSIS — Z9109 Other allergy status, other than to drugs and biological substances: Secondary | ICD-10-CM

## 2015-09-21 HISTORY — DX: Other allergy status, other than to drugs and biological substances: Z91.09

## 2015-09-22 ENCOUNTER — Ambulatory Visit (INDEPENDENT_AMBULATORY_CARE_PROVIDER_SITE_OTHER): Payer: BLUE CROSS/BLUE SHIELD | Admitting: Cardiology

## 2015-09-22 ENCOUNTER — Encounter: Payer: Self-pay | Admitting: Family

## 2015-09-22 ENCOUNTER — Other Ambulatory Visit: Payer: Self-pay

## 2015-09-22 ENCOUNTER — Ambulatory Visit: Payer: BLUE CROSS/BLUE SHIELD | Attending: Family | Admitting: Family

## 2015-09-22 ENCOUNTER — Encounter: Payer: Self-pay | Admitting: Cardiology

## 2015-09-22 VITALS — BP 82/52 | HR 74 | Ht 72.0 in | Wt 248.0 lb

## 2015-09-22 VITALS — BP 75/53 | HR 77 | Resp 20 | Ht 72.0 in | Wt 249.0 lb

## 2015-09-22 DIAGNOSIS — I5022 Chronic systolic (congestive) heart failure: Secondary | ICD-10-CM | POA: Insufficient documentation

## 2015-09-22 DIAGNOSIS — I2510 Atherosclerotic heart disease of native coronary artery without angina pectoris: Secondary | ICD-10-CM | POA: Insufficient documentation

## 2015-09-22 DIAGNOSIS — N183 Chronic kidney disease, stage 3 (moderate): Secondary | ICD-10-CM | POA: Insufficient documentation

## 2015-09-22 DIAGNOSIS — L409 Psoriasis, unspecified: Secondary | ICD-10-CM | POA: Insufficient documentation

## 2015-09-22 DIAGNOSIS — I480 Paroxysmal atrial fibrillation: Secondary | ICD-10-CM | POA: Diagnosis not present

## 2015-09-22 DIAGNOSIS — G4730 Sleep apnea, unspecified: Secondary | ICD-10-CM | POA: Diagnosis not present

## 2015-09-22 DIAGNOSIS — R0602 Shortness of breath: Secondary | ICD-10-CM | POA: Diagnosis not present

## 2015-09-22 DIAGNOSIS — E119 Type 2 diabetes mellitus without complications: Secondary | ICD-10-CM | POA: Diagnosis not present

## 2015-09-22 DIAGNOSIS — I10 Essential (primary) hypertension: Secondary | ICD-10-CM | POA: Insufficient documentation

## 2015-09-22 DIAGNOSIS — Z79899 Other long term (current) drug therapy: Secondary | ICD-10-CM | POA: Diagnosis not present

## 2015-09-22 DIAGNOSIS — R42 Dizziness and giddiness: Secondary | ICD-10-CM | POA: Insufficient documentation

## 2015-09-22 DIAGNOSIS — I440 Atrioventricular block, first degree: Secondary | ICD-10-CM | POA: Insufficient documentation

## 2015-09-22 DIAGNOSIS — I5023 Acute on chronic systolic (congestive) heart failure: Secondary | ICD-10-CM | POA: Insufficient documentation

## 2015-09-22 DIAGNOSIS — Z9889 Other specified postprocedural states: Secondary | ICD-10-CM | POA: Insufficient documentation

## 2015-09-22 DIAGNOSIS — I950 Idiopathic hypotension: Secondary | ICD-10-CM

## 2015-09-22 DIAGNOSIS — Z7901 Long term (current) use of anticoagulants: Secondary | ICD-10-CM | POA: Insufficient documentation

## 2015-09-22 DIAGNOSIS — Z7984 Long term (current) use of oral hypoglycemic drugs: Secondary | ICD-10-CM | POA: Insufficient documentation

## 2015-09-22 DIAGNOSIS — E1169 Type 2 diabetes mellitus with other specified complication: Secondary | ICD-10-CM | POA: Insufficient documentation

## 2015-09-22 DIAGNOSIS — I429 Cardiomyopathy, unspecified: Secondary | ICD-10-CM | POA: Diagnosis not present

## 2015-09-22 DIAGNOSIS — I959 Hypotension, unspecified: Secondary | ICD-10-CM | POA: Diagnosis not present

## 2015-09-22 DIAGNOSIS — I272 Other secondary pulmonary hypertension: Secondary | ICD-10-CM | POA: Insufficient documentation

## 2015-09-22 DIAGNOSIS — I129 Hypertensive chronic kidney disease with stage 1 through stage 4 chronic kidney disease, or unspecified chronic kidney disease: Secondary | ICD-10-CM | POA: Insufficient documentation

## 2015-09-22 DIAGNOSIS — E669 Obesity, unspecified: Secondary | ICD-10-CM | POA: Insufficient documentation

## 2015-09-22 DIAGNOSIS — G4733 Obstructive sleep apnea (adult) (pediatric): Secondary | ICD-10-CM

## 2015-09-22 DIAGNOSIS — E785 Hyperlipidemia, unspecified: Secondary | ICD-10-CM | POA: Insufficient documentation

## 2015-09-22 MED ORDER — APIXABAN 5 MG PO TABS: 5.0000 mg | ORAL_TABLET | Freq: Two times a day (BID) | ORAL | Status: DC

## 2015-09-22 NOTE — Patient Instructions (Addendum)
Medication Instructions:  Your physician recommends that you continue on your current medications as directed. Please refer to the Current Medication list given to you today. Sent in prescription for eliquis to new pharmacy.  Labwork: Your physician recommends that you return for lab work in: 1 week for BMP.  Date & Time:________________________________________________________   Testing/Procedures:   Your physician has requested that you have an echocardiogram. Echocardiography is a painless test that uses sound waves to create images of your heart. It provides your doctor with information about the size and shape of your heart and how well your heart's chambers and valves are working. This procedure takes approximately one hour. There are no restrictions for this procedure.  Date & Time: __________________________________________________________    Follow-Up: Your physician recommends that you schedule a follow-up appointment in: 2 months with Dr. Yvone Neu  Date & Time: ____________________________________________________________   Any Other Special Instructions Will Be Listed Below (If Applicable).     If you need a refill on your cardiac medications before your next appointment, please call your pharmacy.  Basic Metabolic Panel WHY AM I HAVING THIS TEST? A basic metabolic panel measures levels of the following substances in your blood:   Glucose. Glucose is a simple sugar that serves as the main source of energy for your body.  Creatinine. Creatinine is a waste product of normal muscle activity. It is excreted from the body by the kidneys.  Blood urea nitrogen (BUN). Urea nitrogen is a waste product of protein breakdown. It is produced when excess protein in your body is broken down and used for energy. It is excreted by the kidneys.  Electrolytes. Electrolytes are negatively or positively charged particles that are dissolved in the water of different body compartments. This  includes the serum portion of blood, water inside cells, and water outside cells. Concentrations of electrolytes vary among the different fluid compartments. Electrolytes are tightly regulated to maintain a salt-water and acid-base balance in the body. The electrolytes measured in a basic metabolic panel include:  Potassium.  Sodium.  Chloride.  Calcium.  Bicarbonate. WHAT KIND OF SAMPLE IS TAKEN? A blood sample is required for this test. It is usually collected by inserting a needle into a vein. HOW DO I PREPARE FOR THE TEST? Your health care provider may ask you to avoid eating or drinking anything before your blood sample is taken. Do not eat or drink anything after midnight on the night before the procedure or as directed by your health care provider. WHAT ARE THE REFERENCE RANGES? Reference ranges are considered healthy ranges established after testing a large group of healthy people. Reference ranges may vary among different people, labs, and hospitals. It is your responsibility to obtain your test results. Ask the lab or department performing the test when and how you will get your results. The following are reference ranges for each part of a basic metabolic panel: Glucose  Cord: 45-96 mg/dL or 2.5-5.3 mmol/L (SI units).  Premature infant: 20-60 mg/dL or 1.1-3.3 mmol/L.  Neonate: 30-60 mg/dL or 1.7-3.3 mmol/L.  Infant: 40-90 mg/dL or 2.2-5 mmol/L.  Child under 55 years old: 60-100 mg/dL or 3.3-5.5 mmol/L.  Adult or child over 68 years old:  Fasting: 70-110 mg/dL or less than 6.1 mmol/L.  Random (nonfasting or casual): less than or equal to 200 mg/dL or less than 11.1 mmol/L.  Elderly: increase in normal range after age 73 years. Creatinine  Child under 27 years old: 0.1-0.4 mg/dL.  Child 57-29 years old: 0.2-0.5 mg/dL.  Child 89-30 years old: 0.3-0.6 mg/dL.  Child or adolescent 17-4 years old: 0.4-1 mg/dL.  Adult 51-61 years old:  Male: 0.5-1 mg/dL.  Male:  0.6-1.2 mg/dL.  Adult 27-52 years old:  Male: 0.5-1.1 mg/dL.  Male: 0.6-1.3 mg/dL.  Adult 1 years old and above:  Male: 0.5-1.2 mg/dL.  Male: 0.7-1.3 mg/dL. BUN  Cord: 21-40 mg/dL.  Newborn: 3-12 mg/dL.  Infant: 5-18 mg/dL.  Child: 5-18 mg/dL.  Adult: 10-20 mg/dL or 3.6-7.1 mmol/L (SI units).  Elderly: may be slightly higher than adult. Potassium  Newborn: 3.9-5.9 mEq/L.  Infant: 4.1-5.3 mEq/L.  Child: 3.4-4.7 mEq/L.  Adult or elderly: 3.5-5 mEq/L or 3.5-5 mmol/L (SI units). Sodium  Newborn: 134-144 mEq/L.  Infant: 134-150 mEq/L.  Child: 136-145 mEq/L.  Adult or elderly: 136-145 mEq/L or 136-145 mmol/L (SI units). Chloride  Premature infant: 95-110 mEq/L.  Newborn: 96-106 mEq/L.  Child: 90-110 mEq/L.  Adult or elderly: 98-106 mEq/L or 98-106 mmol/L (SI units). Calcium  Total calcium:  Newborn under 73 days old: 7.6-10.4 mg/dL or 1.9-2.6 mmol/L.  Umbilical: [id number] mg/dL or 2.25-2.88 mmol/L.  7 days to 63 years old: 9-10.6 mg/dL or 2.30-2.65 mmol/L.  Child: 8.8-10.8 mg/dL or 2.2-2.7 mmol/L.  Adult: 9-10.5 mg/dL or 2.25-2.62 mmol/L.  Ionized calcium:  Newborn: 4.20-5.58 mg/dL or 1.05-1.37 mmol/L.  2 months to 63 years old: 4.80-5.52 mg/dL or 1.20-1.38 mmol/L.  Adult: 4.5-5.6 mg/dL or 1.05-1.30 mmol/L. Bicarbonate  Newborn: 13-22 mEq/L.  Infant: 20-28 mEq/L.  Child: 20-28 mEq/L.  Adult or elderly: 23-30 mEq/L or 23-30 mmol/L (SI units). WHAT DO THE RESULTS MEAN? Diet and levels of activity can have an effect on your test results. Sometimes they can be the cause of values that are outside of normal limits. However, sometimes values outside normal limits can indicate a medical disorder.  Glucose:  Abnormally high glucose levels (hyperglycemia) are usually associated with prediabetes mellitus and diabetes mellitus. They can also occur with severe stress on the body. This can come from surgery or events such as stroke or trauma.  Overactive thyroid gland and pancreatitis or pancreatic cancer can also cause abnormally high glucose levels.  Abnormally low glucose levels (hypoglycemia) can occur with underactive thyroid gland and rare insulin-secreting tumors (insulinoma).  Creatinine:  Abnormally high creatinine levels are most commonly seen in kidney failure. They can also be seen with overactive thyroid (hyperthyroidism), conditions related to overgrowth of the body, abnormal breakdown of muscle tissue, and early muscular dystrophy.  Abnormally low creatinine levels can indicate low muscle mass associated with malnutrition or late-stage muscular dystrophy.  BUN:  Abnormally high BUN levels generally mean that your kidneys are not functioning normally.  Abnormally low BUN levels can be seen with malnutrition and liver failure.  Potassium:  Abnormally high potassium levels are most often seen with kidney disease, massive destruction of red blood cells, and adrenal gland failure.  Abnormally low potassium levels are seen with excessive levels of the hormone aldosterone.  Sodium:  Abnormally high sodium levels can be seen with dehydration, excessive thirst, and urination due to abnormally low levels of antidiuretic hormone (diabetes insipidus). They can also be seen with excessive levels of aldosterone or cortisol in the body.  Abnormally low levels of sodium can be seen with congestive heart failure, cirrhosis of the liver, kidney failure, and the syndrome of inappropriate antidiuretic hormone (SIADH). Chloride:  Abnormally high levels of chloride can be seen with acute kidney failure, diabetes insipidus, prolonged diarrhea, and poisoning with aspirin or bromide.  Abnormally low levels of  chloride can be seen with prolonged vomiting, acute adrenal gland failure, and SIADH.  Calcium:  Abnormally high levels of calcium can occur with excessive activity of the parathyroid glands, certain cancers, and a type of  inflammation seen in sarcoidosis and tuberculosis.  Abnormally low levels of calcium can be seen with underactive parathyroid glands, vitamin D deficiency, and acute pancreatitis.  Bicarbonate:  Abnormally high bicarbonate levels are seen after prolonged vomiting and diuretic therapy, which lead to a decrease in the amount of acid in the body (metabolic alkalosis). They can also be seen in conditions that increase the amount of bicarbonate in the body. These conditions include rare hereditary disorders that interfere with how your kidneys handle electrolytes.  Abnormally low bicarbonate levels are seen with conditions that cause your body to produce too much acid (metabolic acidosis). These conditions include uncontrolled diabetes mellitus and poisoning with aspirin, methanol, or antifreeze (ethylene glycol). Talk with your health care provider to discuss your results, treatment options, and if necessary, the need for more tests. Talk with your health care provider if you have any questions about your results.   This information is not intended to replace advice given to you by your health care provider. Make sure you discuss any questions you have with your health care provider.   Document Released: 06/13/2004 Document Revised: 06/11/2014 Document Reviewed: 10/19/2013 Elsevier Interactive Patient Education 2016 [employer].   Echocardiogram An echocardiogram, or echocardiography, uses sound waves (ultrasound) to produce an image of your heart. The echocardiogram is simple, painless, obtained within a short period of time, and offers valuable information to your health care provider. The images from an echocardiogram can provide information such as:  Evidence of coronary artery disease (CAD).  Heart size.  Heart muscle function.  Heart valve function.  Aneurysm detection.  Evidence of a past heart attack.  Fluid buildup around the heart.  Heart muscle thickening.  Assess heart  valve function. LET [facility] CARE PROVIDER KNOW ABOUT:  Any allergies you have.  All medicines you are taking, including vitamins, herbs, eye drops, creams, and over-the-counter medicines.  Previous problems you or members of your family have had with the use of anesthetics.  Any blood disorders you have.  Previous surgeries you have had.  Medical conditions you have.  Possibility of pregnancy, if this applies. BEFORE THE PROCEDURE  No special preparation is needed. Eat and drink normally.  PROCEDURE   In order to produce an image of your heart, gel will be applied to your chest and a wand-like tool (transducer) will be moved over your chest. The gel will help transmit the sound waves from the transducer. The sound waves will harmlessly bounce off your heart to allow the heart images to be captured in real-time motion. These images will then be recorded.  You may need an IV to receive a medicine that improves the quality of the pictures. AFTER THE PROCEDURE You may return to your normal schedule including diet, activities, and medicines, unless your health care provider tells you otherwise.   This information is not intended to replace advice given to you by your health care provider. Make sure you discuss any questions you have with your health care provider.   Document Released: 05/18/2000 Document Revised: 06/11/2014 Document Reviewed: 01/26/2013 Elsevier Interactive Patient Education 2016 [employer].   Apixaban oral tablets What is this medicine? APIXABAN (a PIX a ban) is an anticoagulant (blood thinner). It is used to lower the chance of stroke in people  with a medical condition called atrial fibrillation. It is also used to treat or prevent blood clots in the lungs or in the veins. This medicine may be used for other purposes; ask your health care provider or pharmacist if you have questions. What should I tell my health care provider before I take this medicine? They  need to know if you have any of these conditions: -bleeding disorders -bleeding in the brain -blood in your stools (black or tarry stools) or if you have blood in your vomit -history of stomach bleeding -kidney disease -liver disease -mechanical heart valve -an unusual or allergic reaction to apixaban, other medicines, foods, dyes, or preservatives -pregnant or trying to get pregnant -breast-feeding How should I use this medicine? Take this medicine by mouth with a glass of water. Follow the directions on the prescription label. You can take it with or without food. If it upsets your stomach, take it with food. Take your medicine at regular intervals. Do not take it more often than directed. Do not stop taking except on your doctor's advice. Stopping this medicine may increase your risk of a blot clot. Be sure to refill your prescription before you run out of medicine. Talk to your pediatrician regarding the use of this medicine in children. Special care may be needed. Overdosage: If you think you have taken too much of this medicine contact a poison control center or emergency room at once. NOTE: This medicine is only for you. Do not share this medicine with others. What if I miss a dose? If you miss a dose, take it as soon as you can. If it is almost time for your next dose, take only that dose. Do not take double or extra doses. What may interact with this medicine? This medicine may interact with the following: -aspirin and aspirin-like medicines -certain medicines for fungal infections like ketoconazole and itraconazole -certain medicines for seizures like carbamazepine and phenytoin -certain medicines that treat or prevent blood clots like warfarin, enoxaparin, and dalteparin -clarithromycin -NSAIDs, medicines for pain and inflammation, like ibuprofen or naproxen -rifampin -ritonavir -St. John's wort This list may not describe all possible interactions. Give your health care  provider a list of all the medicines, herbs, non-prescription drugs, or dietary supplements you use. Also tell them if you smoke, drink alcohol, or use illegal drugs. Some items may interact with your medicine. What should I watch for while using this medicine? Notify your doctor or health care professional and seek emergency treatment if you develop breathing problems; changes in vision; chest pain; severe, sudden headache; pain, swelling, warmth in the leg; trouble speaking; sudden numbness or weakness of the face, arm, or leg. These can be signs that your condition has gotten worse. If you are going to have surgery, tell your doctor or health care professional that you are taking this medicine. Tell your health care professional that you use this medicine before you have a spinal or epidural procedure. Sometimes people who take this medicine have bleeding problems around the spine when they have a spinal or epidural procedure. This bleeding is very rare. If you have a spinal or epidural procedure while on this medicine, call your health care professional immediately if you have back pain, numbness or tingling (especially in your legs and feet), muscle weakness, paralysis, or loss of bladder or bowel control. Avoid sports and activities that might cause injury while you are using this medicine. Severe falls or injuries can cause unseen bleeding. Be careful when using  sharp tools or knives. Consider using an Copy. Take special care brushing or flossing your teeth. Report any injuries, bruising, or red spots on the skin to your doctor or health care professional. What side effects may I notice from receiving this medicine? Side effects that you should report to your doctor or health care professional as soon as possible: -allergic reactions like skin rash, itching or hives, swelling of the face, lips, or tongue -signs and symptoms of bleeding such as bloody or black, tarry stools; red or  dark-brown urine; spitting up blood or brown material that looks like coffee grounds; red spots on the skin; unusual bruising or bleeding from the eye, gums, or nose This list may not describe all possible side effects. Call your doctor for medical advice about side effects. You may report side effects to FDA at 1-800-FDA-1088. Where should I keep my medicine? Keep out of the reach of children. Store at room temperature between 20 and 25 degrees C (68 and 77 degrees F). Throw away any unused medicine after the expiration date. NOTE: This sheet is a summary. It may not cover all possible information. If you have questions about this medicine, talk to your doctor, pharmacist, or health care provider.    2016, Elsevier/[employer]. (2013-01-23 11:59:24)

## 2015-09-22 NOTE — Patient Instructions (Addendum)
Begin weighing daily and call for an overnight weight gain of > 2 pounds or a weekly weight gain of >5 pounds.  Discontinue metolazone.

## 2015-09-22 NOTE — Telephone Encounter (Signed)
Refill sent for Eliquis 5 mg  

## 2015-09-22 NOTE — Progress Notes (Signed)
Subjective:    Patient ID: [name], male    DOB: [birth year], 65 y.o.   MRN: [id number]  Congestive Heart Failure Presents for follow-up visit. The disease course has been improving. Associated symptoms include fatigue and shortness of breath. Pertinent negatives include no chest pain, chest pressure, edema, orthopnea or palpitations. The symptoms have been improving. Past treatments include beta blockers, angiotensin receptor blockers, aldosterone receptor blockers, salt and fluid restriction and weight loss. The treatment provided significant relief. Compliance with prior treatments has been good. His past medical history is significant for arrhythmia, CAD, DM and HTN. He has one 1st degree relative with heart disease.  Dizziness This is a recurrent problem. The current episode started more than 1 month ago. The problem occurs intermittently. The problem has been unchanged. Associated symptoms include congestion and fatigue. Pertinent negatives include no chest pain, chills, coughing, fever, headaches, numbness, sore throat, vertigo, visual change or vomiting. The symptoms are aggravated by bending. He has tried position changes for the symptoms. The treatment provided mild relief.    Past Medical History  Diagnosis Date  . Chronic combined systolic (congestive) and diastolic (congestive) heart failure (HCC)     a. EF 25% by cath in 2013 b. Echo in 07/2015 showing EF of 15-20%, moderate MR, moderate Pulm HTN, severely dilated IVC  . Sleep apnea   . Diabetes mellitus type 2, uncontrolled ([place])     a. A1c 11.0 in 07/2015.  [name] Hypertension   . New onset atrial fibrillation ([place])     a. diagnosed in 07/2015 b. started on Eliquis  . Coronary artery disease   . Paroxysmal atrial fibrillation (HCC)   . Cardiomyopathy ([place])   . Psoriasis   . Pulmonary hypertension ([place])   . Renal disorder     kidney stone  . CKD (chronic kidney disease) stage 3, GFR 30-59 ml/min   . Kidney stones     Left   . Pollen allergies 09-21-15    pt called and stated that he woke up and had some drainage-pt states he did not see the color of the drainage-pt denies running a fever and this only happened once-pt instructed to call Dr Audree Bane office if he starts running a fever or if the color of drainage becomes yellow/green    Past Surgical History  Procedure Laterality Date  . Kidney surgery    . Coronary angioplasty with stent placement    . Cardiac catheterization N/A 07/25/2015    Procedure: Right/Left Heart Cath and Coronary Angiography;  Surgeon: Wellington Hampshire, MD;  Location: [place] [facility];  Service: Cardiovascular;  Laterality: N/A;  . Cardiac catheterization      [place]  . Cardiac catheterization      [facility], [place]  . Nephrostomy tube placement ([facility] hx) Left   . Ureteroscopy with holmium laser lithotripsy Left 09/07/2015    Procedure: URETEROSCOPY WITH HOLMIUM LASER LITHOTRIPSY;  Surgeon: Hollice Espy, MD;  Location: [facility];  Service: Urology;  Laterality: Left;  . Cystoscopy with stent placement Left 09/07/2015    Procedure: CYSTOSCOPY WITH STENT PLACEMENT;  Surgeon: Hollice Espy, MD;  Location: [facility];  Service: Urology;  Laterality: Left;    Family History  Problem Relation Age of Onset  . Heart failure Father   . Heart attack Brother     Social History  Substance Use Topics  . Smoking status: Never Smoker   . Smokeless tobacco: Never Used  . Alcohol Use: 0.0 oz/week    0  Standard drinks or equivalent per week     Comment: occasionally    No Known Allergies  Prior to Admission medications   Medication Sig Start Date End Date Taking? Authorizing Provider  ALPRAZolam (XANAX) 0.25 MG tablet Take 1 tablet (0.25 mg total) by mouth every 8 (eight) hours as needed for anxiety. 08/01/15  Yes Lytle Butte, MD  amiodarone (PACERONE) 200 MG tablet Take 1 tablet (200 mg total) by mouth daily. Patient taking differently: Take 200 mg by mouth every morning.   08/01/15  Yes Lytle Butte, MD  atorvastatin (LIPITOR) 40 MG tablet Take 1 tablet (40 mg total) by mouth daily at 6 PM. 08/01/15  Yes Lytle Butte, MD  b complex vitamins tablet Take 1 tablet by mouth daily.   Yes Historical Provider, MD  carvedilol (COREG) 6.25 MG tablet Take 1 tablet (6.25 mg total) by mouth 2 (two) times daily. 08/22/15  Yes Deboraha Sprang, MD  cholecalciferol (VITAMIN D) 1000 units tablet Take 2,000 Units by mouth daily.   Yes Historical Provider, MD  Coenzyme Q10 (CO Q 10 PO) Take 400 mg by mouth daily.   Yes Historical Provider, MD  Cranberry 500 MG CAPS Take 1,000 mg by mouth daily.   Yes Historical Provider, MD  furosemide (LASIX) 80 MG tablet Take 1 tablet (80 mg total) by mouth 2 (two) times daily. 08/01/15  Yes Lytle Butte, MD  glipiZIDE (GLUCOTROL) 10 MG tablet Take 20 mg by mouth 2 (two) times daily. Pt takes three tablets in the morning and two tablets at bedtime.   Yes Historical Provider, MD  Cathrine Muster 550 MG CAPS Take 550 mg by mouth daily.   Yes Historical Provider, MD  HYDROcodone-acetaminophen (NORCO/VICODIN) 5-325 MG tablet Take 1-2 tablets by mouth every 6 (six) hours as needed for moderate pain. 09/07/15  Yes Hollice Espy, MD  losartan (COZAAR) 25 MG tablet Take 1 tablet (25 mg total) by mouth daily. Patient taking differently: Take 25 mg by mouth at bedtime.  08/22/15  Yes Deboraha Sprang, MD  metFORMIN (GLUCOPHAGE) 500 MG tablet Take 1,000 mg by mouth daily with breakfast.    Yes Historical Provider, MD  metFORMIN (GLUCOPHAGE) 500 MG tablet Take 500 mg by mouth at bedtime.   Yes Historical Provider, MD  multivitamin-lutein (OCUVITE-LUTEIN) CAPS capsule Take 1 capsule by mouth 2 (two) times daily.   Yes Historical Provider, MD  OVER THE COUNTER MEDICATION Take 3 capsules by mouth 2 (two) times daily. Pt takes beet root.   Yes Historical Provider, MD  OVER THE COUNTER MEDICATION Take 1 capsule by mouth daily. Pt takes caprylic acid.   Yes Historical  Provider, MD  oxyCODONE-acetaminophen (PERCOCET/ROXICET) 5-325 MG tablet Take 1 tablet by mouth every 4 (four) hours as needed for moderate pain. 08/01/15  Yes Lytle Butte, MD  polyethylene glycol [facility] / GLYCOLAX) packet Take 17 g by mouth daily as needed for mild constipation.   Yes Historical Provider, MD  potassium phosphate, monobasic, (K-PHOS ORIGINAL) 500 MG tablet Take 500 mg by mouth 2 (two) times daily with a meal.   Yes Historical Provider, MD  spironolactone (ALDACTONE) 25 MG tablet Take 25 mg by mouth 2 (two) times daily.    Yes Historical Provider, MD  TURMERIC CURCUMIN PO Take 1 capsule by mouth daily.   Yes Historical Provider, MD  vitamin C (ASCORBIC ACID) 500 MG tablet Take 1,000 mg by mouth daily.   Yes Historical Provider, MD  vitamin E 400  UNIT capsule Take 400 Units by mouth daily.   Yes Historical Provider, MD  Wheat Dextrin (BENEFIBER) CHEW Chew 1 tablet by mouth daily as needed.   Yes Historical Provider, MD  apixaban (ELIQUIS) 5 MG TABS tablet Take 1 tablet (5 mg total) by mouth 2 (two) times daily. 09/22/15   Wende Bushy, MD     Review of Systems  Constitutional: Positive for fatigue. Negative for fever, chills and appetite change.  HENT: Positive for congestion. Negative for postnasal drip and sore throat.   Eyes: Negative.   Respiratory: Positive for shortness of breath. Negative for cough, chest tightness and wheezing.   Cardiovascular: Negative for chest pain, palpitations and leg swelling.  Gastrointestinal: Positive for constipation. Negative for vomiting and abdominal distention.  Endocrine: Negative.   Genitourinary: Positive for urgency and frequency.  Musculoskeletal: Positive for back pain (due to kidney stones).  Skin: Negative.   Allergic/Immunologic: Negative.   Neurological: Positive for dizziness and light-headedness. Negative for vertigo, numbness and headaches.  Hematological: Negative for adenopathy. Bruises/bleeds easily.   Psychiatric/Behavioral: Negative for sleep disturbance (sleeping on 1 pillow) and dysphoric mood. The patient is not nervous/anxious.        Objective:   Physical Exam  Constitutional: [name] oriented to person, place, and time. He appears well-developed and well-nourished.  HENT:  Head: Normocephalic and atraumatic.  Eyes: Conjunctivae are normal. Pupils are equal, round, and reactive to light.  Neck: Normal range of motion. Neck supple.  Cardiovascular: Normal rate and regular rhythm.   Pulmonary/Chest: Effort normal. He has no wheezes. He has no rales.  Abdominal: Soft. He exhibits no distension. There is no tenderness.  Musculoskeletal: He exhibits no edema or tenderness.  Neurological: [name] alert and oriented to person, place, and time.  Skin: Skin is warm and dry.  Psychiatric: He has a normal mood and affect. His behavior is normal. Thought content normal.  Nursing note and vitals reviewed.   BP 75/53 mmHg  Pulse 77  Resp 20  Ht 6' (1.829 m)  Wt 249 lb (112.946 kg)  BMI 33.76 kg/m2  SpO2 100%       Assessment & Plan:  1: Chronic heart failure with reduced ejection fraction- Patient presents with fatigue and shortness of breath upon exertion (Class II). He says that he was mildly short of breath upon walking into the office but once he sat down, his breathing quickly improved. He says that he's weighing himself about every other day because it's been gradually going down. By our scale, he's lost 21 pounds since he was last here on 08/18/15. Discussed the importance of weighing himself on a daily basis and to call for an overnight weight gain of >2 pounds or a weekly weight gain of >5 pounds. [name] not adding any salt to his food and is trying to eat low sodium foods. He does drink low sodium V8 juice every morning but does account for the sodium in the juice. He admits that he doesn't get any exercise and he was encouraged to be as active as possible. [facility] went in and  reviewed medication list with the patient. Is scheduled to see his cardiologist today.  2: Atrial fibrillation- Currently rate controlled with amiodarone and carvedilol.  3: Hypotension- Blood pressure lower than last time, he reports dizziness with position changes and he's had losartan added since he was last here. With his 21 pound weight loss and no signs of fluid overload, advised patient to stop the metolazone.  He can continue to take his furosemide 80mg  twice daily and should his weight increase, he could take the metolazone if needed. Patient verbalized understanding. 4: Sleep apnea- He says that he's currently scheduled for a sleep study sometime early May 2017.  Patient brought a medication list and it was reviewed with him.  Return here in 3 months or sooner for any questions/problems before then.

## 2015-09-22 NOTE — Progress Notes (Signed)
Cardiology Office Note   Date:  09/22/2015   ID:  [name], DOB [birth year], MRN [id number]  Referring Doctor:  Jim Cara, NP   Cardiologist:   Jim Bushy, MD   Reason for consultation:  Chief Complaint  Patient presents with  . other    1 month f/u pt is passing kidney stones. Meds reviewed verbally with pt.      History of Present Illness: [name] is a 65 y.o. male who presents for atrial fibrillation, congestive heart failure  In terms of atrial fibrillation, as far as he is concerned, heart rate is controlled. He does not have any symptoms of palpitations from this. He continues to take the anticoagulant. He has no significant bleeding issues.  In terms of congestive heart failure, he has been aware of a weakened heart for many years now. He was told that he had a heart attack but he does not recall an event in the past where he had significant chest pain. Since medication changes the last time, he has noticed more episodes with dizziness associated with a low blood pressure. He would like to stop metolazone as he feels he is too dried out. No loss of consciousness, no palpitations, no chest pain.  Patient is likely class III in terms of functional capacity for CHF. He denies chest pain. No shortness of breath at rest. No palpitations. No loss of consciousness. No syncope.   ROS:  Please see the history of present illness. Aside from mentioned under HPI, all other systems are reviewed and negative.     Past Medical History  Diagnosis Date  . Chronic combined systolic (congestive) and diastolic (congestive) heart failure (HCC)     a. EF 25% by cath in 2013 b. Echo in 07/2015 showing EF of 15-20%, moderate MR, moderate Pulm HTN, severely dilated IVC  . Sleep apnea   . Diabetes mellitus type 2, uncontrolled ([place])     a. A1c 11.0 in 07/2015.  [name] Hypertension   . New onset atrial fibrillation ([place])     a. diagnosed in 07/2015 b. started on Eliquis  . Coronary  artery disease   . Paroxysmal atrial fibrillation (HCC)   . Cardiomyopathy ([place])   . Psoriasis   . Pulmonary hypertension ([place])   . Renal disorder     kidney stone  . CKD (chronic kidney disease) stage 3, GFR 30-59 ml/min   . Kidney stones     Left  . Pollen allergies 09-21-15    pt called and stated that he woke up and had some drainage-pt states he did not see the color of the drainage-pt denies running a fever and this only happened once-pt instructed to call Dr Audree Bane office if he starts running a fever or if the color of drainage becomes yellow/green    Past Surgical History  Procedure Laterality Date  . Kidney surgery    . Coronary angioplasty with stent placement    . Cardiac catheterization N/A 07/25/2015    Procedure: Right/Left Heart Cath and Coronary Angiography;  Surgeon: Jim Hampshire, MD;  Location: [place] [facility];  Service: Cardiovascular;  Laterality: N/A;  . Cardiac catheterization      [place]  . Cardiac catheterization      [facility], [place]  . Nephrostomy tube placement ([facility] hx) Left   . Ureteroscopy with holmium laser lithotripsy Left 09/07/2015    Procedure: URETEROSCOPY WITH HOLMIUM LASER LITHOTRIPSY;  Surgeon: Jim Espy, MD;  Location: [facility];  Service:  Urology;  Laterality: Left;  . Cystoscopy with stent placement Left 09/07/2015    Procedure: CYSTOSCOPY WITH STENT PLACEMENT;  Surgeon: Jim Espy, MD;  Location: [facility];  Service: Urology;  Laterality: Left;     reports that he has never smoked. He has never used smokeless tobacco. He reports that he drinks alcohol. He reports that he does not use illicit drugs.   family history includes Heart attack in his brother; Heart failure in his father.   Current Outpatient Prescriptions  Medication Sig Dispense Refill  . ALPRAZolam (XANAX) 0.25 MG tablet Take 1 tablet (0.25 mg total) by mouth every 8 (eight) hours as needed for anxiety. 30 tablet 0  . amiodarone (PACERONE) 200 MG tablet  Take 1 tablet (200 mg total) by mouth daily. (Patient taking differently: Take 200 mg by mouth every morning. ) 30 tablet 0  . apixaban (ELIQUIS) 5 MG TABS tablet Take 1 tablet (5 mg total) by mouth 2 (two) times daily. 60 tablet 11  . atorvastatin (LIPITOR) 40 MG tablet Take 1 tablet (40 mg total) by mouth daily at 6 PM. 30 tablet 0  . b complex vitamins tablet Take 1 tablet by mouth daily.    . carvedilol (COREG) 6.25 MG tablet Take 1 tablet (6.25 mg total) by mouth 2 (two) times daily. 60 tablet 6  . cholecalciferol (VITAMIN D) 1000 units tablet Take 2,000 Units by mouth daily.    . Coenzyme Q10 (CO Q 10 PO) Take 400 mg by mouth daily.    . Cranberry 500 MG CAPS Take 1,000 mg by mouth daily.    . furosemide (LASIX) 80 MG tablet Take 1 tablet (80 mg total) by mouth 2 (two) times daily. 60 tablet 0  . glipiZIDE (GLUCOTROL) 10 MG tablet Take 20 mg by mouth 2 (two) times daily. Pt takes three tablets in the morning and two tablets at bedtime.    [name] Haw[place] Berry 550 MG CAPS Take 550 mg by mouth daily.    [name] HYDROcodone-acetaminophen (NORCO/VICODIN) 5-325 MG tablet Take 1-2 tablets by mouth every 6 (six) hours as needed for moderate pain. 20 tablet 0  . losartan (COZAAR) 25 MG tablet Take 1 tablet (25 mg total) by mouth daily. (Patient taking differently: Take 25 mg by mouth at bedtime. ) 30 tablet 6  . metFORMIN (GLUCOPHAGE) 500 MG tablet Take 1,000 mg by mouth daily with breakfast.     . metFORMIN (GLUCOPHAGE) 500 MG tablet Take 500 mg by mouth at bedtime.    . multivitamin-lutein (OCUVITE-LUTEIN) CAPS capsule Take 1 capsule by mouth 2 (two) times daily.    [name] OVER THE COUNTER MEDICATION Take 3 capsules by mouth 2 (two) times daily. Pt takes beet root.    [name] OVER THE COUNTER MEDICATION Take 1 capsule by mouth daily. Pt takes caprylic acid.    [name] oxyCODONE-acetaminophen (PERCOCET/ROXICET) 5-325 MG tablet Take 1 tablet by mouth every 4 (four) hours as needed for moderate pain. 30 tablet 0  .  polyethylene glycol (MIRALAX / GLYCOLAX) packet Take 17 g by mouth daily as needed for mild constipation.    . potassium phosphate, monobasic, (K-PHOS ORIGINAL) 500 MG tablet Take 500 mg by mouth 2 (two) times daily with a meal.    . spironolactone (ALDACTONE) 25 MG tablet Take 25 mg by mouth 2 (two) times daily.     . TURMERIC CURCUMIN PO Take 1 capsule by mouth daily.    . vitamin C (ASCORBIC ACID) 500 MG tablet Take 1,000 mg  by mouth daily.    . vitamin E 400 UNIT capsule Take 400 Units by mouth daily.    . Wheat Dextrin (BENEFIBER) CHEW Chew 1 tablet by mouth daily as needed.     No current facility-administered medications for this visit.    Allergies: Review of patient's allergies indicates no known allergies.    PHYSICAL EXAM: VS:  BP 82/52 mmHg  Pulse 74  Ht 6' (1.829 m)  Wt 248 lb (112.492 kg)  BMI 33.63 kg/m2 , Body mass index is 33.63 kg/(m^2). Wt Readings from Last 3 Encounters:  09/22/15 248 lb (112.492 kg)  09/22/15 249 lb (112.946 kg)  09/19/15 253 lb (114.76 kg)    GENERAL:  well developed, well nourished,  obese, not in acute distress HEENT: normocephalic, pink conjunctivae, anicteric sclerae, no xanthelasma, normal dentition, oropharynx clear NECK:  no neck vein engorgement, JVP normal, no hepatojugular reflux, carotid upstroke brisk and symmetric, no bruit, no thyromegaly, no lymphadenopathy LUNGS:  good respiratory effort, clear to auscultation bilaterally CV:  PMI not displaced, no thrills, no lifts, S2 within normal limits, no palpable S3 or S4, no murmurs, no rubs, no gallops ABD:  Soft, nontender, distended, normoactive bowel sounds, no abdominal aortic bruit, no hepatomegaly, no splenomegaly MS: nontender back, no kyphosis, no scoliosis, no joint deformities EXT:  2+ DP/PT pulses, No edema, no varicosities, no cyanosis, no clubbing SKIN: warm, nondiaphoretic, normal turgor, no ulcers NEUROPSYCH: alert, oriented to person, place, and time, sensory/motor  grossly intact, normal mood, appropriate affect  Recent Labs: 07/09/2015: B Natriuretic Peptide 1891.0* 07/29/2015: Magnesium 2.0 08/31/2015: BUN 59*; Creatinine, Ser 1.42*; Hemoglobin 14.3; Platelets 193; Potassium 4.6; Sodium 134*   Lipid Panel    Component Value Date/Time   CHOL 153 07/09/2015 0455   TRIG 168* 07/09/2015 0455   HDL 34* 07/09/2015 0455   CHOLHDL 4.5 07/09/2015 0455   VLDL 34 07/09/2015 0455   LDLCALC 85 07/09/2015 0455     Other studies Reviewed:  EKG:   The ekg ordered 08/15/2015 was personally reviewed by me and it reveals sinus rhythm first degree AV block. 85 BPM. LAFB. Cannot rule out anterior infarct.  EKG from 09/22/2015 was personally reviewed by me and reveals sinus rhythm with first-degree AV block. 74 BPM. LAFB. Cannot rule out anterior infarct. No significant change from EKG 08/15/2015. QTC 490 ms.  Additional studies/ records that were reviewed personally reviewed by me today include:  Echocardiogram 07/09/2015:  LV moderately dilated. Mild concentric hypertrophy. EF 15-20%. Possible Akinesis of anterior septal, anterior, anterolateral myocardium. Moderate MR. Left atrium moderately dilated. RV moderately dilated. Systolic function moderately reduced. R a mildly dilated. IVC severely dilated.  Right and left heart cath 07/25/2015, Dr. Fletcher Anon:  Proximal LAD to mid LAD lesion, 60% stenosed. Previously treated with a stent. Distal RCA 60% stenosed. Ostial second diagonal to second diagonal, 70% stenosed. Moderately elevated filling pressures with moderate pulmonary hypertension. Hemodynamic features suggestive of restrictive physiology Moderate two-vessel CAD. Patent proximal LAD stent with 60% in-stent restenosis. LAD and RCA moderately calcified. PA pressure: 56/34 with a mean of 42, LV pressure: 95/15 with left ventricular end-diastolic pressure of 26. Cardiac output was 4.86 with a cardiac index of 1.9.   ASSESSMENT AND PLAN:  congestive heart  failure, systolic, chronic CHF, not in acute decompensation, EF 15-20% Ischemic cardiomyopathy Biventricular congestive heart failure Appears euvolemic. Weight has been stable. Commended on following sodium restriction, lifestyle changes. Blood pressure is on the low side. Agree with stopping metolazone for now. Check  BMP in 1 week. Continue to monitor blood pressure. Continue carvedilol, losartan, Lasix, potassium replacement, Aldactone. Repeat echocardiogram. If EF remains low, patient to follow-up with Dr. Caryl Comes for evaluation for ICD for primary prevention of SCD   Pulmonary hypertension, pulmonary venous, secondary to CHF Likely improve with diuresis. Continue to monitor  Coronary artery disease, moderate two-vessel CAD, no high-grade stenosis on last heart catheterization 07/25/2015.  No further investigation at this point prior to planned urologic procedure. No chest pain. Consider aspirin 81 mg by mouth daily after urologic procedure. Continue statin therapy. LDL goal less than 70.  Atrial fibrillation, likely paroxysmal, on antiarrhythmic medication, on anticoagulation, currently in sinus rhythm Continue antiarrhythmic medication for now. Continue carvedilol.  Patient tolerating anticoagulation. Continue eliquis for now. May hold 24 hours prior to planned urologic procedure. Resume as soon as okay with urology.   Mitral regurgitation, moderate Likely related to CHF, dilated cardiomyopathy. Should improve with better volume status and blood pressure control. Continue to monitor   First-degree AV block  on low-dose beta blockade. Continue to monitor  Hypertension  continue blood pressure log continue current meds . We'll be stopping metolazone.  Hyperlipidemia  continue statin therapy. LDL goal less than 70   Obesity Body mass index is 33.63 kg/(m^2).[name] Recommend aggressive weight loss through diet and increased physical activity as tolerated.  Sleep apnea Follow-up with PCP  regarding CPAP    Current medicines are reviewed at length with the patient today.  The patient does not have concerns regarding medicines.  Labs/ tests ordered today include:  Orders Placed This Encounter  Procedures  . Basic Metabolic Panel (BMET)  . EKG 12-Lead  . Echocardiogram    I had a lengthy and detailed discussion with the patient regarding diagnoses, prognosis, diagnostic options, treatment options, and side effects of medications.   I counseled the patient on importance of lifestyle modification including heart healthy diet, regular physical activity.  Disposition:   FU with undersigned In 2 months  Signed, Jim Bushy, MD  09/22/2015 12:41 PM    [place]

## 2015-09-23 ENCOUNTER — Telehealth: Payer: Self-pay | Admitting: Radiology

## 2015-09-23 ENCOUNTER — Ambulatory Visit: Payer: MEDICAID | Admitting: Urology

## 2015-09-23 NOTE — Pre-Procedure Instructions (Signed)
Pt stated on phone that he is going to hold his Eloquis 48 hours prior to surgery-I told him that his Urologist in [place] had stated 24 hours prior to surgery. He said he held it 48 hours prior to his last surgery on 09-07-15.  I told him that he really needs to follow instructions that the office gave him.

## 2015-09-23 NOTE — Telephone Encounter (Signed)
Pt called to r/s surgery d/t he feels he is getting the "stomach flu". Surgery r/s to 10/10/15. Advised pt to follow instructions he was given at pre-admit testing for the new date. Pt voices understanding. Per Junie Panning in pre-admit testing pt does not need to repeat pre-admit appt at this time.

## 2015-09-23 NOTE — Pre-Procedure Instructions (Signed)
Cardiology Office Note   Date: 09/22/2015   ID: [name], DOB February 27, 1951, MRN [id number]  Referring Doctor: Elisabeth Cara, NP  Cardiologist: Wende Bushy, MD   Reason for consultation:  Chief Complaint  Patient presents with  . other    1 month f/u pt is passing kidney stones. Meds reviewed verbally with pt.     History of Present Illness: [name] is a 65 y.o. male who presents for atrial fibrillation, congestive heart failure  In terms of atrial fibrillation, as far as he is concerned, heart rate is controlled. He does not have any symptoms of palpitations from this. He continues to take the anticoagulant. He has no significant bleeding issues.  In terms of congestive heart failure, he has been aware of a weakened heart for many years now. He was told that he had a heart attack but he does not recall an event in the past where he had significant chest pain. Since medication changes the last time, he has noticed more episodes with dizziness associated with a low blood pressure. He would like to stop metolazone as he feels he is too dried out. No loss of consciousness, no palpitations, no chest pain.  Patient is likely class III in terms of functional capacity for CHF. He denies chest pain. No shortness of breath at rest. No palpitations. No loss of consciousness. No syncope.   ROS: Please see the history of present illness. Aside from mentioned under HPI, all other systems are reviewed and negative.     Past Medical History  Diagnosis Date  . Chronic combined systolic (congestive) and diastolic (congestive) heart failure (HCC)     a. EF 25% by cath in 2013 b. Echo in 07/2015 showing EF of 15-20%, moderate MR, moderate Pulm HTN, severely dilated IVC  . Sleep apnea   . Diabetes mellitus type 2, uncontrolled ([facility])     a. A1c 11.0 in 07/2015.  [name] Hypertension   . New onset atrial fibrillation ([place])     a. diagnosed in  07/2015 b. started on Eliquis  . Coronary artery disease   . Paroxysmal atrial fibrillation (HCC)   . Cardiomyopathy ([place])   . Psoriasis   . Pulmonary hypertension ([place])   . Renal disorder     kidney stone  . CKD (chronic kidney disease) stage 3, GFR 30-59 ml/min   . Kidney stones     Left  . Pollen allergies 09-21-15    pt called and stated that he woke up and had some drainage-pt states he did not see the color of the drainage-pt denies running a fever and this only happened once-pt instructed to call Dr Audree Bane office if he starts running a fever or if the color of drainage becomes yellow/green    Past Surgical History  Procedure Laterality Date  . Kidney surgery    . Coronary angioplasty with stent placement    . Cardiac catheterization N/A 07/25/2015    Procedure: Right/Left Heart Cath and Coronary Angiography; Surgeon: Wellington Hampshire, MD; Location: [place] [facility]; Service: Cardiovascular; Laterality: N/A;  . Cardiac catheterization      [place]  . Cardiac catheterization      [facility], [place]  . Nephrostomy tube placement ([facility] hx) Left   . Ureteroscopy with holmium laser lithotripsy Left 09/07/2015    Procedure: URETEROSCOPY WITH HOLMIUM LASER LITHOTRIPSY; Surgeon: Hollice Espy, MD; Location: [facility]; Service: Urology; Laterality: Left;  . Cystoscopy with stent placement Left 09/07/2015    Procedure: CYSTOSCOPY WITH  STENT PLACEMENT; Surgeon: Hollice Espy, MD; Location: [facility]; Service: Urology; Laterality: Left;     reports that he has never smoked. He has never used smokeless tobacco. He reports that he drinks alcohol. He reports that he does not use illicit drugs.   family history includes Heart attack in his brother; Heart failure in his father.   Current Outpatient Prescriptions  Medication Sig Dispense Refill  . ALPRAZolam (XANAX) 0.25 MG  tablet Take 1 tablet (0.25 mg total) by mouth every 8 (eight) hours as needed for anxiety. 30 tablet 0  . amiodarone (PACERONE) 200 MG tablet Take 1 tablet (200 mg total) by mouth daily. (Patient taking differently: Take 200 mg by mouth every morning. ) 30 tablet 0  . apixaban (ELIQUIS) 5 MG TABS tablet Take 1 tablet (5 mg total) by mouth 2 (two) times daily. 60 tablet 11  . atorvastatin (LIPITOR) 40 MG tablet Take 1 tablet (40 mg total) by mouth daily at 6 PM. 30 tablet 0  . b complex vitamins tablet Take 1 tablet by mouth daily.    . carvedilol (COREG) 6.25 MG tablet Take 1 tablet (6.25 mg total) by mouth 2 (two) times daily. 60 tablet 6  . cholecalciferol (VITAMIN D) 1000 units tablet Take 2,000 Units by mouth daily.    . Coenzyme Q10 (CO Q 10 PO) Take 400 mg by mouth daily.    . Cranberry 500 MG CAPS Take 1,000 mg by mouth daily.    . furosemide (LASIX) 80 MG tablet Take 1 tablet (80 mg total) by mouth 2 (two) times daily. 60 tablet 0  . glipiZIDE (GLUCOTROL) 10 MG tablet Take 20 mg by mouth 2 (two) times daily. Pt takes three tablets in the morning and two tablets at bedtime.    [name] Haw[place]orne Berry 550 MG CAPS Take 550 mg by mouth daily.    [name] HYDROcodone-acetaminophen (NORCO/VICODIN) 5-325 MG tablet Take 1-2 tablets by mouth every 6 (six) hours as needed for moderate pain. 20 tablet 0  . losartan (COZAAR) 25 MG tablet Take 1 tablet (25 mg total) by mouth daily. (Patient taking differently: Take 25 mg by mouth at bedtime. ) 30 tablet 6  . metFORMIN (GLUCOPHAGE) 500 MG tablet Take 1,000 mg by mouth daily with breakfast.     . metFORMIN (GLUCOPHAGE) 500 MG tablet Take 500 mg by mouth at bedtime.    . multivitamin-lutein (OCUVITE-LUTEIN) CAPS capsule Take 1 capsule by mouth 2 (two) times daily.    [name] OVER THE COUNTER MEDICATION Take 3 capsules by mouth 2 (two) times daily. Pt takes beet root.    [name] OVER THE  COUNTER MEDICATION Take 1 capsule by mouth daily. Pt takes caprylic acid.    [name] oxyCODONE-acetaminophen (PERCOCET/ROXICET) 5-325 MG tablet Take 1 tablet by mouth every 4 (four) hours as needed for moderate pain. 30 tablet 0  . polyethylene glycol (MIRALAX / GLYCOLAX) packet Take 17 g by mouth daily as needed for mild constipation.    . potassium phosphate, monobasic, (K-PHOS ORIGINAL) 500 MG tablet Take 500 mg by mouth 2 (two) times daily with a meal.    . spironolactone (ALDACTONE) 25 MG tablet Take 25 mg by mouth 2 (two) times daily.     . TURMERIC CURCUMIN PO Take 1 capsule by mouth daily.    . vitamin C (ASCORBIC ACID) 500 MG tablet Take 1,000 mg by mouth daily.    . vitamin E 400 UNIT capsule Take 400 Units by mouth daily.    [name]  Wheat Dextrin (BENEFIBER) CHEW Chew 1 tablet by mouth daily as needed.     No current facility-administered medications for this visit.    Allergies: Review of patient's allergies indicates no known allergies.    PHYSICAL EXAM: VS: BP 82/52 mmHg  Pulse 74  Ht 6' (1.829 m)  Wt 248 lb (112.492 kg)  BMI 33.63 kg/m2 , Body mass index is 33.63 kg/(m^2). Wt Readings from Last 3 Encounters:  09/22/15 248 lb (112.492 kg)  09/22/15 249 lb (112.946 kg)  09/19/15 253 lb (114.76 kg)    GENERAL: well developed, well nourished, obese, not in acute distress HEENT: normocephalic, pink conjunctivae, anicteric sclerae, no xanthelasma, normal dentition, oropharynx clear NECK: no neck vein engorgement, JVP normal, no hepatojugular reflux, carotid upstroke brisk and symmetric, no bruit, no thyromegaly, no lymphadenopathy LUNGS: good respiratory effort, clear to auscultation bilaterally CV: PMI not displaced, no thrills, no lifts, S2 within normal limits, no palpable S3 or S4, no murmurs, no rubs, no gallops ABD: Soft, nontender, distended, normoactive bowel sounds, no abdominal aortic bruit, no hepatomegaly, no  splenomegaly MS: nontender back, no kyphosis, no scoliosis, no joint deformities EXT: 2+ DP/PT pulses, No edema, no varicosities, no cyanosis, no clubbing SKIN: warm, nondiaphoretic, normal turgor, no ulcers NEUROPSYCH: alert, oriented to person, place, and time, sensory/motor grossly intact, normal mood, appropriate affect  Recent Labs: 07/09/2015: B Natriuretic Peptide 1891.0* 07/29/2015: Magnesium 2.0 08/31/2015: BUN 59*; Creatinine, Ser 1.42*; Hemoglobin 14.3; Platelets 193; Potassium 4.6; Sodium 134*   Lipid Panel  Labs (Brief)       Component Value Date/Time   CHOL 153 07/09/2015 0455   TRIG 168* 07/09/2015 0455   HDL 34* 07/09/2015 0455   CHOLHDL 4.5 07/09/2015 0455   VLDL 34 07/09/2015 0455   LDLCALC 85 07/09/2015 0455      Other studies Reviewed:  EKG:  The ekg ordered 08/15/2015 was personally reviewed by me and it reveals sinus rhythm first degree AV block. 85 BPM. LAFB. Cannot rule out anterior infarct.  EKG from 09/22/2015 was personally reviewed by me and reveals sinus rhythm with first-degree AV block. 74 BPM. LAFB. Cannot rule out anterior infarct. No significant change from EKG 08/15/2015. QTC 490 ms.  Additional studies/ records that were reviewed personally reviewed by me today include:  Echocardiogram 07/09/2015:  LV moderately dilated. Mild concentric hypertrophy. EF 15-20%. Possible Akinesis of anterior septal, anterior, anterolateral myocardium. Moderate MR. Left atrium moderately dilated. RV moderately dilated. Systolic function moderately reduced. R a mildly dilated. IVC severely dilated.  Right and left heart cath 07/25/2015, Dr. Fletcher Anon:  Proximal LAD to mid LAD lesion, 60% stenosed. Previously treated with a stent. Distal RCA 60% stenosed. Ostial second diagonal to second diagonal, 70% stenosed. Moderately elevated filling pressures with moderate pulmonary hypertension. Hemodynamic features suggestive of restrictive  physiology Moderate two-vessel CAD. Patent proximal LAD stent with 60% in-stent restenosis. LAD and RCA moderately calcified. PA pressure: 56/34 with a mean of 42, LV pressure: 95/15 with left ventricular end-diastolic pressure of 26. Cardiac output was 4.86 with a cardiac index of 1.9.   ASSESSMENT AND PLAN:  congestive heart failure, systolic, chronic CHF, not in acute decompensation, EF 15-20% Ischemic cardiomyopathy Biventricular congestive heart failure Appears euvolemic. Weight has been stable. Commended on following sodium restriction, lifestyle changes. Blood pressure is on the low side. Agree with stopping metolazone for now. Check BMP in 1 week. Continue to monitor blood pressure. Continue carvedilol, losartan, Lasix, potassium replacement, Aldactone. Repeat echocardiogram. If EF remains low, patient to  follow-up with Dr. Caryl Comes for evaluation for ICD for primary prevention of SCD   Pulmonary hypertension, pulmonary venous, secondary to CHF Likely improve with diuresis. Continue to monitor  Coronary artery disease, moderate two-vessel CAD, no high-grade stenosis on last heart catheterization 07/25/2015.  No further investigation at this point prior to planned urologic procedure. No chest pain. Consider aspirin 81 mg by mouth daily after urologic procedure. Continue statin therapy. LDL goal less than 70.  Atrial fibrillation, likely paroxysmal, on antiarrhythmic medication, on anticoagulation, currently in sinus rhythm Continue antiarrhythmic medication for now. Continue carvedilol.  Patient tolerating anticoagulation. Continue eliquis for now. May hold 24 hours prior to planned urologic procedure. Resume as soon as okay with urology.   Mitral regurgitation, moderate Likely related to CHF, dilated cardiomyopathy. Should improve with better volume status and blood pressure control. Continue to monitor   First-degree AV block on low-dose beta blockade. Continue to  monitor  Hypertension continue blood pressure log continue current meds . We'll be stopping metolazone.  Hyperlipidemia continue statin therapy. LDL goal less than 70   Obesity Body mass index is 33.63 kg/(m^2).[name] Recommend aggressive weight loss through diet and increased physical activity as tolerated.  Sleep apnea Follow-up with PCP regarding CPAP    Current medicines are reviewed at length with the patient today. The patient does not have concerns regarding medicines.  Labs/ tests ordered today include:  Orders Placed This Encounter  Procedures  . Basic Metabolic Panel (BMET)  . EKG 12-Lead  . Echocardiogram    I had a lengthy and detailed discussion with the patient regarding diagnoses, prognosis, diagnostic options, treatment options, and side effects of medications.   I counseled the patient on importance of lifestyle modification including heart healthy diet, regular physical activity.  Disposition: FU with undersigned In 2 months  Signed, Wende Bushy, MD  09/22/2015 12:41 PM  [place]

## 2015-09-28 ENCOUNTER — Other Ambulatory Visit: Payer: Self-pay

## 2015-09-28 DIAGNOSIS — N200 Calculus of kidney: Secondary | ICD-10-CM

## 2015-09-29 ENCOUNTER — Other Ambulatory Visit: Payer: BLUE CROSS/BLUE SHIELD

## 2015-09-29 ENCOUNTER — Other Ambulatory Visit: Payer: Self-pay | Admitting: Family

## 2015-09-29 ENCOUNTER — Telehealth: Payer: Self-pay | Admitting: Family

## 2015-09-29 MED ORDER — SPIRONOLACTONE 25 MG PO TABS: 25.0000 mg | ORAL_TABLET | Freq: Every day | ORAL | Status: DC

## 2015-09-29 MED ORDER — POTASSIUM PHOSPHATE MONOBASIC 500 MG PO TABS: 500.0000 mg | ORAL_TABLET | Freq: Every day | ORAL | Status: DC

## 2015-09-29 NOTE — Telephone Encounter (Signed)
Had received phone call from [facility] regarding lab work that was drawn on patient 09/28/15. Cr 1.84, BUN 67 & K+ 5.6. Losartan had been added 3/20 and metolazone had been stopped 4/20. Called patient to confirm that he had stopped the metolazone, which he had. Advised patient to decrease his spironolactone to 25mg  daily (was BID) along with decreasing his potassium supplement to once daily (was BID). Would have liked to decrease his spironolactone to 12.5mg  daily but he says that the tablets are too small to cut. Patient verbalized understanding of changes and says that he will go back to [facility] in 2 weeks to have his lab work rechecked.

## 2015-09-30 ENCOUNTER — Other Ambulatory Visit: Payer: Self-pay

## 2015-10-10 ENCOUNTER — Ambulatory Visit: Admission: RE | Admit: 2015-10-10 | Payer: BLUE CROSS/BLUE SHIELD | Source: Ambulatory Visit | Admitting: Urology

## 2015-10-10 ENCOUNTER — Telehealth: Payer: Self-pay | Admitting: Radiology

## 2015-10-10 HISTORY — DX: Other allergy status, other than to drugs and biological substances: Z91.09

## 2015-10-10 SURGERY — CYSTOSCOPY/URETEROSCOPY/HOLMIUM LASER/STENT PLACEMENT
Anesthesia: Choice | Laterality: Left

## 2015-10-10 MED ORDER — SODIUM CHLORIDE 0.9 % IV SOLN: 1.0000 g | Freq: Once | INTRAVENOUS | Status: DC

## 2015-10-10 MED ORDER — GENTAMICIN SULFATE 40 MG/ML IJ SOLN
80.0000 mg | Freq: Once | INTRAVENOUS | Status: DC
  Filled 2015-10-10: qty 2

## 2015-10-10 MED ORDER — GENTAMICIN IN SALINE 1.6-0.9 MG/ML-% IV SOLN
80.0000 mg | Freq: Once | INTRAVENOUS | Status: DC
  Filled 2015-10-10: qty 50

## 2015-10-10 NOTE — Telephone Encounter (Signed)
Notified pt of surgery rescheduled to 10/25/15, pre-admit testing phone interview on 10/18/15 between 1-5pm and to call day prior to surgery for arrival time to SDS. Advised pt to hold Eliquis 24 hrs prior to surgery & that per Dr Erlene Quan there is no need to repeat ucx. Pt voices understanding.

## 2015-10-12 ENCOUNTER — Ambulatory Visit: Payer: BLUE CROSS/BLUE SHIELD | Attending: Neurology

## 2015-10-12 DIAGNOSIS — G4761 Periodic limb movement disorder: Secondary | ICD-10-CM | POA: Insufficient documentation

## 2015-10-12 DIAGNOSIS — G4733 Obstructive sleep apnea (adult) (pediatric): Secondary | ICD-10-CM | POA: Insufficient documentation

## 2015-10-13 ENCOUNTER — Other Ambulatory Visit: Payer: Self-pay

## 2015-10-13 ENCOUNTER — Ambulatory Visit (INDEPENDENT_AMBULATORY_CARE_PROVIDER_SITE_OTHER): Payer: BLUE CROSS/BLUE SHIELD

## 2015-10-13 DIAGNOSIS — I480 Paroxysmal atrial fibrillation: Secondary | ICD-10-CM | POA: Diagnosis not present

## 2015-10-13 DIAGNOSIS — I5022 Chronic systolic (congestive) heart failure: Secondary | ICD-10-CM

## 2015-10-14 ENCOUNTER — Telehealth: Payer: Self-pay | Admitting: Cardiology

## 2015-10-14 NOTE — Telephone Encounter (Signed)
Patient called in to discuss his echo results. Results reviewed in detail and he was glad to hear that his EF is better. He verbalized understanding of our conversation and had no further questions at this time.

## 2015-10-18 ENCOUNTER — Encounter: Payer: Self-pay | Admitting: *Deleted

## 2015-10-18 ENCOUNTER — Other Ambulatory Visit: Payer: BLUE CROSS/BLUE SHIELD

## 2015-10-18 NOTE — Pre-Procedure Instructions (Signed)
[name]  ECHO COMPLETE WO IMAGE ENHANCING AGENT  Order# [id number]   Reading physician: Wellington Hampshire, MD  Ordering physician: Wende Bushy, MD  Study date: 10/13/15      Result Notes    Notes Recorded by Valora Corporal, RN on 10/13/2015 at 5:02 PM Left detailed voicemail message with results and instructions to call back if any further questions. ------  Notes Recorded by Wende Bushy, MD on 10/13/2015 at 4:29 PM Heart function is improved, still not normal. Improved from 15-20% though. Will discuss with patient on follow-up.    Study Result    Result status: Final result     *[address]  [place], [place] [id number]  [phone]  ------------------------------------------------------------------- Transthoracic Echocardiography  Patient: Jim, Love MR #: [id number] Study Date: 10/13/2015 Gender: M Age: 64 Height: 182.9 cm Weight: 112.5 kg BSA: 2.43 m^2 Pt. Status: Room:  PERFORMING [place], [place] SONOGRAPHER Pilar Jarvis, RVT, RDCS, RDMS ATTENDING Wende Bushy, MD ORDERING Wende Bushy, MD REFERRING Wende Bushy, MD  cc:  ------------------------------------------------------------------- LV EF: 35% - 40%  ------------------------------------------------------------------- History: PMH: Atrial fibrillation. Congestive heart failure. Primary pulmonary hypertension. Risk factors: Diabetes mellitus. Dyslipidemia.  ------------------------------------------------------------------- Study Conclusions  - Left ventricle: The cavity size was mildly dilated. There was  mild concentric hypertrophy. Systolic function was moderately  reduced. The estimated ejection fraction was in the range of 35%  to 40%. Images were inadequate for LV wall motion assessment.   Doppler parameters are consistent with abnormal left ventricular  relaxation (grade 1 diastolic dysfunction). - Pulmonary arteries: Systolic pressure was within the normal  range.  Impressions:  - EF improved since February with resolution of pulmonary  hypertension.  Transthoracic echocardiography. M-mode, complete 2D, spectral Doppler, and color Doppler. Birthdate: Patient birthdate: [birth year]. Age: Patient is 65 yr old. Sex: Gender: male. BMI: 33.6 kg/m^2. Blood pressure: 122/60 Patient status: Outpatient. Study date: Study date: 10/13/2015. Study time: 07:30 AM.  -------------------------------------------------------------------  ------------------------------------------------------------------- Left ventricle: The cavity size was mildly dilated. There was mild concentric hypertrophy. Systolic function was moderately reduced. The estimated ejection fraction was in the range of 35% to 40%. Images were inadequate for LV wall motion assessment. Doppler parameters are consistent with abnormal left ventricular relaxation (grade 1 diastolic dysfunction).  ------------------------------------------------------------------- Aortic valve: Trileaflet; normal thickness, mildly calcified leaflets. Mobility was not restricted. Doppler: Transvalvular velocity was within the normal range. There was no stenosis. There was no regurgitation.  ------------------------------------------------------------------- Aorta: Aortic root: The aortic root was normal in size.  ------------------------------------------------------------------- Mitral valve: Structurally normal valve. Mobility was not restricted. Doppler: Transvalvular velocity was within the normal range. There was no evidence for stenosis. There was no regurgitation.  ------------------------------------------------------------------- Left atrium: The atrium was normal in  size.  ------------------------------------------------------------------- Right ventricle: The cavity size was normal. Wall thickness was normal. Systolic function was normal.  ------------------------------------------------------------------- Pulmonic valve: Doppler: Transvalvular velocity was within the normal range. There was no evidence for stenosis.  ------------------------------------------------------------------- Tricuspid valve: Structurally normal valve. Doppler: Transvalvular velocity was within the normal range. There was no regurgitation.  ------------------------------------------------------------------- Pulmonary artery: The main pulmonary artery was normal-sized. Systolic pressure was within the normal range.  ------------------------------------------------------------------- Right atrium: The atrium was normal in size.  ------------------------------------------------------------------- Pericardium: There was no pericardial effusion.  ------------------------------------------------------------------- Systemic veins: Inferior vena cava: The vessel was normal in size.  ------------------------------------------------------------------- Measurements  Left ventricle Value Reference LV ID, ED, PLAX chordal (H) 54.4 mm 43 - 52 LV ID, ES, PLAX chordal (  H) 41.2 mm 23 - 38 LV fx shortening, PLAX chordal (L) 24 % >=29 LV PW thickness, ED 13 mm --------- IVS/LV PW ratio, ED 0.98 <=1.3 Stroke volume, 2D 69 ml --------- Stroke volume/bsa, 2D 28 ml/m^2 --------- LV ejection fraction, 1-p A4C 34 % --------- LV e&', lateral 4.57 cm/s --------- LV E/e&', lateral 14.64  --------- LV e&', medial 6.96 cm/s --------- LV E/e&', medial 9.61 --------- LV e&', average 5.77 cm/s --------- LV E/e&', average 11.6 ---------  Ventricular septum Value Reference IVS thickness, ED 12.8 mm ---------  LVOT Value Reference LVOT ID, S 23 mm --------- LVOT area 4.15 cm^2 --------- LVOT ID 23 mm --------- LVOT peak velocity, S 85 cm/s --------- LVOT mean velocity, S 50.7 cm/s --------- LVOT VTI, S 16.7 cm --------- Stroke volume (SV), LVOT DP 69.4 ml --------- Stroke index (SV/bsa), LVOT DP 28.6 ml/m^2 ---------  Aorta Value Reference Aortic root ID, ED 32 mm --------- Ascending aorta ID, A-P, S 33 mm ---------  Left atrium Value Reference LA ID, A-P, ES 34 mm --------- LA ID/bsa, A-P 1.4 cm/m^2 <=2.2 LA volume, ES, 1-p A4C 40.4 ml --------- LA volume/bsa, ES, 1-p A4C 16.7 ml/m^2 ---------  Mitral valve Value Reference Mitral E-wave peak velocity 66.9 cm/s --------- Mitral A-wave peak velocity 83.9 cm/s --------- Mitral deceleration time (H) 271 ms 150 - 230 Mitral E/A ratio, peak 0.8 ---------  Right ventricle Value  Reference TAPSE 16.3 mm ---------  Pulmonic valve Value Reference Pulmonic valve peak velocity, S 99.9 cm/s ---------  Legend: (L) and (H) mark values outside specified reference range.  ------------------------------------------------------------------- Prepared and Electronically Authenticated by  Kathlyn Sacramento, MD 2017-05-11T12:42:42    PACS Images    Show images for Echocardiogram    Patient Information    Patient Name Sex DOB SSN   Jim, Love Male 04/25/1951 [id number]    Reason For Exam  Priority: Routine   Congestive Heart Failure 428.0 / I50.9   Dx: Paroxysmal atrial fibrillation ([place]) [I48.0 (ICD-10-CM)]; Chronic systolic congestive heart failure ([place]) [I50.22 (ICD-10-CM)]    Procedures Performed    Code Procedure Name   [id number] ECHO COMPLETE WO IMAGING ENHANCING AGENT    Performing Technologist/Nurse    Performing Technologist/Nurse: Rolla Flatten Documents:    There are no order-level documents.    Encounter-Level Documents - 10/13/2015:      Electronic signature on 10/13/2015 7:18 AM    Signed    Electronically signed by Wellington Hampshire, MD on 10/13/15 at 1242 EDT    Printable Result Report    Result Report    Echocardiogram (Order [id number])  Echocardiography  Order: [id number]   Released By: Rudi Coco  Authorizing: Wende Bushy, MD   Date: 10/13/2015  Department: [facility]       Procedure Abnormality Status    Echocardiogram      Result Notes     Notes Recorded by Valora Corporal, RN on 10/13/2015 at 5:02 PM Left detailed voicemail message with results and instructions to call back if any further questions. ------  Notes Recorded by Wende Bushy, MD on 10/13/2015 at 4:29 PM Heart function is improved, still not normal. Improved from 15-20% though. Will discuss with patient on  follow-up.     Order Information     Order Date/Time Release Date/Time Start Date/Time End Date/Time    09/22/15 11:32 AM 10/13/15 07:19 AM 10/13/15 07:19 AM None      Order Details  Frequency Duration Priority Order Class    None None Routine Ancillary Performed      Acc#    [id number]      Order Questions     Question Answer Comment    Where should this test be performed [facility]     Complete or Limited study? Complete     With Image Enhancing Agent or without Image Enhancing Agent? With Image Enhancing Agent     Note:  Select Without Image Enhancing Agent only if contraindicated.    Expected Date: 1 week     Reason for exam-Echo Congestive Heart Failure 428.0 / I50.9       Associated Diagnoses       ICD-9-CM ICD-10-CM    Paroxysmal atrial fibrillation (HCC)    427.31 I48.0    Chronic systolic congestive heart failure ([place])    428.22 428.0 I50.22      Authorizing Provider Audit Trail     Date/Time Authorizing Provider Changed by    10/13/2015 7:19 AM Wende Bushy, MD Valora Corporal, RN      Order Requisition     ECHO COMPLETE (Order [id number]) on 10/13/15     Collection Information     Collected: 10/13/2015 7:30 AM   Resulting Agency: [facility]       View SmartLink Info     ECHO COMPLETE (Order IN:[id number]) on 10/13/15

## 2015-10-18 NOTE — Patient Instructions (Addendum)
  Your procedure is scheduled on: 10-25-15 Report to [facility] To find out your arrival time please call [phone] between 1PM - 3PM on 10-24-15  Remember: Instructions that are not followed completely may result in serious medical risk, up to and including death, or upon the discretion of your surgeon and anesthesiologist your surgery may need to be rescheduled.    _X___ 1. Do not eat food or drink liquids after midnight. No gum chewing or hard candies.     _X___ 2. No Alcohol for 24 hours before or after surgery.   ____ 3. Bring all medications with you on the day of surgery if instructed.    ____ 4. Notify your doctor if there is any change in your medical condition     (cold, fever, infections).     Do not wear jewelry, make-up, hairpins, clips or nail polish.  Do not wear lotions, powders, or perfumes. You may wear deodorant.  Do not shave 48 hours prior to surgery. Men may shave face and neck.  Do not bring valuables to the hospital.    [facility] is not responsible for any belongings or valuables.               Contacts, dentures or bridgework may not be worn into surgery.  Leave your suitcase in the car. After surgery it may be brought to your room.  For patients admitted to the hospital, discharge time is determined by your treatment team.   Patients discharged the day of surgery will not be allowed to drive home.   Please read over the following fact sheets that you were given:     _X___ Take these medicines the morning of surgery with A SIP OF WATER:    1. AMIODARONE   2. XANAX  3. CARVEDILOL  4.  5.  6.  ____ Fleet Enema (as directed)   ____ Use CHG Soap as directed  ____ Use inhalers on the day of surgery  _X__ Stop metformin 2 days prior to surgery-LAST DOSE ON Saturday 10-22-15   ____ Take 1/2 of usual insulin dose the night before surgery and none on the morning of surgery.   _X__ Stop Coumadin/Plavix/aspirin-PT TO STOP  ELOQUIS 24 HOURS PRIOR TO SURGERY PER AMY RN AT DR Audree Bane OFFICE (IN EPIC-NOTED ON 10-10-15)  _X___ Stop Anti-inflammatories-NO NSAIDS OR ASA PRODUCTS-PERCOCET/VICODIN OK TO CONTINUE   _X___ Stop supplements until after surgery-STOP ALL SUPPLEMENTS EXCEPT CAN CONTINUE VITAMIN D AND BENEFIBER   ____ Bring C-Pap to the hospital.

## 2015-10-24 NOTE — Pre-Procedure Instructions (Signed)
CARDIAC CLEARANCE NOTE FROM DR [facility] FOR  PREVIOUS KIDNEY STONE SURGERY IN MARCH 2017 FROM 08-15-15

## 2015-10-25 ENCOUNTER — Observation Stay
Admission: RE | Admit: 2015-10-25 | Discharge: 2015-10-26 | Disposition: A | Discharge status: Home / Self Care | Payer: BLUE CROSS/BLUE SHIELD | Source: Ambulatory Visit | Attending: Urology | Admitting: Urology

## 2015-10-25 ENCOUNTER — Ambulatory Visit: Payer: BLUE CROSS/BLUE SHIELD | Admitting: Certified Registered"

## 2015-10-25 ENCOUNTER — Encounter
Admission: RE | Disposition: A | Discharge status: Home / Self Care | Payer: Self-pay | Source: Ambulatory Visit | Attending: Urology

## 2015-10-25 ENCOUNTER — Encounter: Payer: Self-pay | Admitting: Anesthesiology

## 2015-10-25 DIAGNOSIS — I272 Other secondary pulmonary hypertension: Secondary | ICD-10-CM | POA: Diagnosis not present

## 2015-10-25 DIAGNOSIS — Z7984 Long term (current) use of oral hypoglycemic drugs: Secondary | ICD-10-CM | POA: Diagnosis not present

## 2015-10-25 DIAGNOSIS — N201 Calculus of ureter: Secondary | ICD-10-CM

## 2015-10-25 DIAGNOSIS — I429 Cardiomyopathy, unspecified: Secondary | ICD-10-CM | POA: Insufficient documentation

## 2015-10-25 DIAGNOSIS — I959 Hypotension, unspecified: Secondary | ICD-10-CM | POA: Diagnosis not present

## 2015-10-25 DIAGNOSIS — Z955 Presence of coronary angioplasty implant and graft: Secondary | ICD-10-CM | POA: Insufficient documentation

## 2015-10-25 DIAGNOSIS — Z79899 Other long term (current) drug therapy: Secondary | ICD-10-CM | POA: Insufficient documentation

## 2015-10-25 DIAGNOSIS — E1122 Type 2 diabetes mellitus with diabetic chronic kidney disease: Secondary | ICD-10-CM | POA: Diagnosis not present

## 2015-10-25 DIAGNOSIS — Z6834 Body mass index (BMI) 34.0-34.9, adult: Secondary | ICD-10-CM | POA: Insufficient documentation

## 2015-10-25 DIAGNOSIS — I5042 Chronic combined systolic (congestive) and diastolic (congestive) heart failure: Secondary | ICD-10-CM | POA: Insufficient documentation

## 2015-10-25 DIAGNOSIS — Z23 Encounter for immunization: Secondary | ICD-10-CM | POA: Diagnosis not present

## 2015-10-25 DIAGNOSIS — E1165 Type 2 diabetes mellitus with hyperglycemia: Secondary | ICD-10-CM | POA: Diagnosis not present

## 2015-10-25 DIAGNOSIS — R42 Dizziness and giddiness: Secondary | ICD-10-CM | POA: Diagnosis not present

## 2015-10-25 DIAGNOSIS — I2510 Atherosclerotic heart disease of native coronary artery without angina pectoris: Secondary | ICD-10-CM | POA: Diagnosis not present

## 2015-10-25 DIAGNOSIS — G4733 Obstructive sleep apnea (adult) (pediatric): Secondary | ICD-10-CM | POA: Diagnosis not present

## 2015-10-25 DIAGNOSIS — E871 Hypo-osmolality and hyponatremia: Secondary | ICD-10-CM | POA: Insufficient documentation

## 2015-10-25 DIAGNOSIS — Z8249 Family history of ischemic heart disease and other diseases of the circulatory system: Secondary | ICD-10-CM | POA: Diagnosis not present

## 2015-10-25 DIAGNOSIS — I440 Atrioventricular block, first degree: Secondary | ICD-10-CM | POA: Insufficient documentation

## 2015-10-25 DIAGNOSIS — I480 Paroxysmal atrial fibrillation: Secondary | ICD-10-CM | POA: Insufficient documentation

## 2015-10-25 DIAGNOSIS — Z7901 Long term (current) use of anticoagulants: Secondary | ICD-10-CM | POA: Insufficient documentation

## 2015-10-25 DIAGNOSIS — I130 Hypertensive heart and chronic kidney disease with heart failure and stage 1 through stage 4 chronic kidney disease, or unspecified chronic kidney disease: Secondary | ICD-10-CM | POA: Diagnosis not present

## 2015-10-25 DIAGNOSIS — I151 Hypertension secondary to other renal disorders: Secondary | ICD-10-CM | POA: Insufficient documentation

## 2015-10-25 DIAGNOSIS — E785 Hyperlipidemia, unspecified: Secondary | ICD-10-CM | POA: Diagnosis not present

## 2015-10-25 DIAGNOSIS — N132 Hydronephrosis with renal and ureteral calculous obstruction: Secondary | ICD-10-CM | POA: Insufficient documentation

## 2015-10-25 DIAGNOSIS — Z87442 Personal history of urinary calculi: Secondary | ICD-10-CM | POA: Diagnosis not present

## 2015-10-25 DIAGNOSIS — N183 Chronic kidney disease, stage 3 (moderate): Secondary | ICD-10-CM | POA: Insufficient documentation

## 2015-10-25 HISTORY — PX: CYSTOSCOPY/URETEROSCOPY/HOLMIUM LASER/STENT PLACEMENT: SHX6546

## 2015-10-25 LAB — POCT I-STAT 4, (NA,K, GLUC, HGB,HCT)
GLUCOSE: 158 mg/dL — AB (ref 65–99)
HCT: 39 % (ref 39.0–52.0)
Hemoglobin: 13.3 g/dL (ref 13.0–17.0)
Potassium: 3.8 mmol/L (ref 3.5–5.1)
Sodium: 136 mmol/L (ref 135–145)

## 2015-10-25 LAB — GLUCOSE, CAPILLARY
GLUCOSE-CAPILLARY: 174 mg/dL — AB (ref 65–99)
Glucose-Capillary: 166 mg/dL — ABNORMAL HIGH (ref 65–99)

## 2015-10-25 SURGERY — CYSTOSCOPY/URETEROSCOPY/HOLMIUM LASER/STENT PLACEMENT
Anesthesia: General | Laterality: Left

## 2015-10-25 MED ORDER — METFORMIN HCL 500 MG PO TABS
500.0000 mg | ORAL_TABLET | Freq: Every day | ORAL | Status: DC
  Administered 2015-10-25: 500 mg via ORAL
  Filled 2015-10-25: qty 1

## 2015-10-25 MED ORDER — CARVEDILOL 3.125 MG PO TABS
3.1250 mg | ORAL_TABLET | Freq: Two times a day (BID) | ORAL | Status: DC
  Administered 2015-10-25 – 2015-10-26 (×2): 3.125 mg via ORAL
  Filled 2015-10-25 (×2): qty 1

## 2015-10-25 MED ORDER — DIPHENHYDRAMINE HCL 12.5 MG/5ML PO ELIX
12.5000 mg | ORAL_SOLUTION | Freq: Four times a day (QID) | ORAL | Status: DC | PRN
  Filled 2015-10-25: qty 5

## 2015-10-25 MED ORDER — HEPARIN SODIUM (PORCINE) 5000 UNIT/ML IJ SOLN
5000.0000 [IU] | Freq: Three times a day (TID) | INTRAMUSCULAR | Status: DC
  Administered 2015-10-25 – 2015-10-26 (×3): 5000 [IU] via SUBCUTANEOUS
  Filled 2015-10-25 (×3): qty 1

## 2015-10-25 MED ORDER — CRANBERRY 500 MG PO CAPS: 1000.0000 mg | ORAL_CAPSULE | Freq: Every day | ORAL | Status: DC

## 2015-10-25 MED ORDER — SODIUM CHLORIDE 0.9 % IV SOLN
INTRAVENOUS | Status: AC
  Filled 2015-10-25: qty 1000

## 2015-10-25 MED ORDER — LIDOCAINE HCL (CARDIAC) 20 MG/ML IV SOLN
INTRAVENOUS | Status: DC | PRN
  Administered 2015-10-25: 100 mg via INTRAVENOUS

## 2015-10-25 MED ORDER — TAMSULOSIN HCL 0.4 MG PO CAPS: 0.4000 mg | ORAL_CAPSULE | Freq: Every day | ORAL | Status: DC

## 2015-10-25 MED ORDER — ATORVASTATIN CALCIUM 20 MG PO TABS
40.0000 mg | ORAL_TABLET | Freq: Every day | ORAL | Status: DC
  Administered 2015-10-25: 40 mg via ORAL
  Filled 2015-10-25: qty 2

## 2015-10-25 MED ORDER — MORPHINE SULFATE (PF) 2 MG/ML IV SOLN: 2.0000 mg | INTRAVENOUS | Status: DC | PRN

## 2015-10-25 MED ORDER — PNEUMOCOCCAL VAC POLYVALENT 25 MCG/0.5ML IJ INJ
0.5000 mL | INJECTION | INTRAMUSCULAR | Status: AC
  Administered 2015-10-26: 0.5 mL via INTRAMUSCULAR
  Filled 2015-10-25: qty 0.5

## 2015-10-25 MED ORDER — SODIUM CHLORIDE 0.9 % IV SOLN
INTRAVENOUS | Status: DC
  Administered 2015-10-25: 07:00:00 via INTRAVENOUS

## 2015-10-25 MED ORDER — ONDANSETRON HCL 4 MG/2ML IJ SOLN
INTRAMUSCULAR | Status: DC | PRN
  Administered 2015-10-25: 4 mg via INTRAVENOUS

## 2015-10-25 MED ORDER — OXYCODONE-ACETAMINOPHEN 5-325 MG PO TABS
1.0000 | ORAL_TABLET | ORAL | Status: DC | PRN
  Administered 2015-10-26: 1 via ORAL
  Filled 2015-10-25: qty 1

## 2015-10-25 MED ORDER — DOCUSATE SODIUM 100 MG PO CAPS
100.0000 mg | ORAL_CAPSULE | Freq: Two times a day (BID) | ORAL | Status: DC
  Administered 2015-10-26: 100 mg via ORAL
  Filled 2015-10-25 (×2): qty 1

## 2015-10-25 MED ORDER — SODIUM CHLORIDE 0.9% FLUSH: 3.0000 mL | INTRAVENOUS | Status: DC | PRN

## 2015-10-25 MED ORDER — SODIUM CHLORIDE 0.9 % IV SOLN
10000.0000 ug | INTRAVENOUS | Status: DC | PRN
  Administered 2015-10-25: 40 ug/min via INTRAVENOUS

## 2015-10-25 MED ORDER — POTASSIUM PHOSPHATE MONOBASIC 500 MG PO TABS
500.0000 mg | ORAL_TABLET | Freq: Every day | ORAL | Status: DC
  Filled 2015-10-25: qty 1

## 2015-10-25 MED ORDER — SODIUM CHLORIDE 0.9 % IV SOLN: 250.0000 mL | INTRAVENOUS | Status: DC | PRN

## 2015-10-25 MED ORDER — VITAMIN D 1000 UNITS PO TABS
2000.0000 [IU] | ORAL_TABLET | Freq: Every day | ORAL | Status: DC
  Administered 2015-10-26: 2000 [IU] via ORAL
  Filled 2015-10-25: qty 2

## 2015-10-25 MED ORDER — CARVEDILOL 6.25 MG PO TABS: 6.2500 mg | ORAL_TABLET | Freq: Two times a day (BID) | ORAL | Status: DC

## 2015-10-25 MED ORDER — METFORMIN HCL 500 MG PO TABS
1000.0000 mg | ORAL_TABLET | Freq: Every day | ORAL | Status: DC
  Administered 2015-10-26: 1000 mg via ORAL
  Filled 2015-10-25: qty 2

## 2015-10-25 MED ORDER — LOSARTAN POTASSIUM 25 MG PO TABS
25.0000 mg | ORAL_TABLET | Freq: Every day | ORAL | Status: DC
  Administered 2015-10-25: 25 mg via ORAL
  Filled 2015-10-25: qty 1

## 2015-10-25 MED ORDER — HYDROCODONE-ACETAMINOPHEN 5-325 MG PO TABS: 1.0000 | ORAL_TABLET | Freq: Four times a day (QID) | ORAL | Status: DC | PRN

## 2015-10-25 MED ORDER — VITAMIN C 500 MG PO TABS
1000.0000 mg | ORAL_TABLET | Freq: Every day | ORAL | Status: DC
  Administered 2015-10-26: 1000 mg via ORAL
  Filled 2015-10-25: qty 2

## 2015-10-25 MED ORDER — SODIUM CHLORIDE 0.9 % IV SOLN
1.0000 g | Freq: Once | INTRAVENOUS | Status: AC
  Administered 2015-10-25: 1 g via INTRAVENOUS

## 2015-10-25 MED ORDER — GENTAMICIN IN SALINE 1.6-0.9 MG/ML-% IV SOLN
80.0000 mg | INTRAVENOUS | Status: DC
  Filled 2015-10-25: qty 50

## 2015-10-25 MED ORDER — CO Q 10 10 MG PO CAPS: 400.0000 mg | ORAL_CAPSULE | Freq: Every day | ORAL | Status: DC

## 2015-10-25 MED ORDER — SUCCINYLCHOLINE CHLORIDE 20 MG/ML IJ SOLN
INTRAMUSCULAR | Status: DC | PRN
  Administered 2015-10-25: 120 mg via INTRAVENOUS

## 2015-10-25 MED ORDER — FENTANYL CITRATE (PF) 100 MCG/2ML IJ SOLN
25.0000 ug | INTRAMUSCULAR | Status: DC | PRN
  Administered 2015-10-25 (×2): 25 ug via INTRAVENOUS

## 2015-10-25 MED ORDER — AMIODARONE HCL 200 MG PO TABS
200.0000 mg | ORAL_TABLET | ORAL | Status: DC
  Administered 2015-10-26: 200 mg via ORAL
  Filled 2015-10-25: qty 1

## 2015-10-25 MED ORDER — FUROSEMIDE 40 MG PO TABS
80.0000 mg | ORAL_TABLET | Freq: Two times a day (BID) | ORAL | Status: DC
  Administered 2015-10-25 – 2015-10-26 (×2): 80 mg via ORAL
  Filled 2015-10-25 (×2): qty 2

## 2015-10-25 MED ORDER — ROCURONIUM BROMIDE 100 MG/10ML IV SOLN
INTRAVENOUS | Status: DC | PRN
  Administered 2015-10-25: 5 mg via INTRAVENOUS
  Administered 2015-10-25: 10 mg via INTRAVENOUS
  Administered 2015-10-25: 30 mg via INTRAVENOUS
  Administered 2015-10-25: 10 mg via INTRAVENOUS
  Administered 2015-10-25: 5 mg via INTRAVENOUS
  Administered 2015-10-25: 10 mg via INTRAVENOUS

## 2015-10-25 MED ORDER — FAMOTIDINE 20 MG PO TABS
20.0000 mg | ORAL_TABLET | Freq: Once | ORAL | Status: AC
Start: 2015-10-25 — End: 2015-10-25
  Administered 2015-10-25: 20 mg via ORAL

## 2015-10-25 MED ORDER — ACETAMINOPHEN 325 MG PO TABS
650.0000 mg | ORAL_TABLET | ORAL | Status: DC | PRN
  Filled 2015-10-25: qty 2

## 2015-10-25 MED ORDER — GLIPIZIDE 5 MG PO TABS
20.0000 mg | ORAL_TABLET | Freq: Two times a day (BID) | ORAL | Status: DC
  Administered 2015-10-25 – 2015-10-26 (×2): 20 mg via ORAL
  Filled 2015-10-25 (×2): qty 4

## 2015-10-25 MED ORDER — GENTAMICIN IN SALINE 0.8-0.9 MG/ML-% IV SOLN
80.0000 mg | INTRAVENOUS | Status: AC
  Administered 2015-10-25: 80 mg via INTRAVENOUS
  Filled 2015-10-25: qty 100

## 2015-10-25 MED ORDER — OXYBUTYNIN CHLORIDE 5 MG PO TABS: 5.0000 mg | ORAL_TABLET | Freq: Three times a day (TID) | ORAL | Status: DC | PRN

## 2015-10-25 MED ORDER — ONDANSETRON HCL 4 MG/2ML IJ SOLN: 4.0000 mg | INTRAMUSCULAR | Status: DC | PRN

## 2015-10-25 MED ORDER — SPIRONOLACTONE 25 MG PO TABS
25.0000 mg | ORAL_TABLET | Freq: Every day | ORAL | Status: DC
  Administered 2015-10-25 – 2015-10-26 (×2): 25 mg via ORAL
  Filled 2015-10-25 (×2): qty 1

## 2015-10-25 MED ORDER — B COMPLEX-C PO TABS
1.0000 | ORAL_TABLET | Freq: Every day | ORAL | Status: DC
  Administered 2015-10-26: 1 via ORAL
  Filled 2015-10-25 (×2): qty 1

## 2015-10-25 MED ORDER — GENTAMICIN SULFATE 40 MG/ML IJ SOLN
80.0000 mg | INTRAVENOUS | Status: DC
  Filled 2015-10-25: qty 2

## 2015-10-25 MED ORDER — ETOMIDATE 2 MG/ML IV SOLN
INTRAVENOUS | Status: DC | PRN
  Administered 2015-10-25: 24 mg via INTRAVENOUS

## 2015-10-25 MED ORDER — FENTANYL CITRATE (PF) 100 MCG/2ML IJ SOLN
INTRAMUSCULAR | Status: DC | PRN
  Administered 2015-10-25 (×2): 50 ug via INTRAVENOUS

## 2015-10-25 MED ORDER — ALPRAZOLAM 0.25 MG PO TABS: 0.2500 mg | ORAL_TABLET | Freq: Three times a day (TID) | ORAL | Status: DC | PRN

## 2015-10-25 MED ORDER — FAMOTIDINE 20 MG PO TABS
ORAL_TABLET | ORAL | Status: AC
  Filled 2015-10-25: qty 1

## 2015-10-25 MED ORDER — POLYETHYLENE GLYCOL 3350 17 G PO PACK: 17.0000 g | PACK | Freq: Every day | ORAL | Status: DC | PRN

## 2015-10-25 MED ORDER — LOSARTAN POTASSIUM 25 MG PO TABS: 25.0000 mg | ORAL_TABLET | Freq: Every day | ORAL | Status: DC

## 2015-10-25 MED ORDER — MIDAZOLAM HCL 2 MG/2ML IJ SOLN
INTRAMUSCULAR | Status: DC | PRN
  Administered 2015-10-25 (×2): 1 mg via INTRAVENOUS

## 2015-10-25 MED ORDER — GLYCOPYRROLATE 0.2 MG/ML IJ SOLN
INTRAMUSCULAR | Status: DC | PRN
  Administered 2015-10-25: 0.6 mg via INTRAVENOUS

## 2015-10-25 MED ORDER — BELLADONNA ALKALOIDS-OPIUM 16.2-60 MG RE SUPP: 1.0000 | Freq: Four times a day (QID) | RECTAL | Status: DC | PRN

## 2015-10-25 MED ORDER — NEOSTIGMINE METHYLSULFATE 10 MG/10ML IV SOLN
INTRAVENOUS | Status: DC | PRN
  Administered 2015-10-25: 5 mg via INTRAVENOUS

## 2015-10-25 MED ORDER — SODIUM CHLORIDE 0.9% FLUSH
3.0000 mL | Freq: Two times a day (BID) | INTRAVENOUS | Status: DC
  Administered 2015-10-25 – 2015-10-26 (×2): 3 mL via INTRAVENOUS

## 2015-10-25 MED ORDER — DIPHENHYDRAMINE HCL 50 MG/ML IJ SOLN: 12.5000 mg | Freq: Four times a day (QID) | INTRAMUSCULAR | Status: DC | PRN

## 2015-10-25 MED ORDER — VITAMIN E 180 MG (400 UNIT) PO CAPS
400.0000 [IU] | ORAL_CAPSULE | Freq: Every day | ORAL | Status: DC
  Administered 2015-10-26: 400 [IU] via ORAL
  Filled 2015-10-25: qty 1

## 2015-10-25 MED ORDER — FENTANYL CITRATE (PF) 100 MCG/2ML IJ SOLN
INTRAMUSCULAR | Status: AC
  Administered 2015-10-25: 25 ug via INTRAVENOUS
  Filled 2015-10-25: qty 2

## 2015-10-25 MED ORDER — PHENYLEPHRINE HCL 10 MG/ML IJ SOLN
INTRAMUSCULAR | Status: DC | PRN
  Administered 2015-10-25 (×2): 100 ug via INTRAVENOUS
  Administered 2015-10-25 (×3): 200 ug via INTRAVENOUS

## 2015-10-25 MED ORDER — ONDANSETRON HCL 4 MG/2ML IJ SOLN: 4.0000 mg | Freq: Once | INTRAMUSCULAR | Status: DC | PRN

## 2015-10-25 MED ORDER — K PHOS MONO-SOD PHOS DI & MONO 155-852-130 MG PO TABS
500.0000 mg | ORAL_TABLET | Freq: Every day | ORAL | Status: DC
  Administered 2015-10-25 – 2015-10-26 (×2): 500 mg via ORAL
  Filled 2015-10-25 (×2): qty 2

## 2015-10-25 SURGICAL SUPPLY — 32 items
ADAPTER SCOPE UROLOK II (MISCELLANEOUS) IMPLANT
BAG DRAIN CYSTO-URO LG1000N (MISCELLANEOUS) ×3 IMPLANT
BASKET ZERO TIP 1.9FR (BASKET) IMPLANT
CATH ROBINSON RED A/P 12FR (CATHETERS) ×3 IMPLANT
CATH URETL 5X70 OPEN END (CATHETERS) ×3 IMPLANT
CLOSURE WOUND 1/2 X4 (GAUZE/BANDAGES/DRESSINGS) ×1
CNTNR SPEC 2.5X3XGRAD LEK (MISCELLANEOUS) ×1
CONRAY 43 FOR UROLOGY 50M (MISCELLANEOUS) ×3 IMPLANT
CONT SPEC 4OZ STER OR WHT (MISCELLANEOUS) ×2
CONTAINER SPEC 2.5X3XGRAD LEK (MISCELLANEOUS) ×1 IMPLANT
DRAPE UTILITY 15X26 TOWEL STRL (DRAPES) ×3 IMPLANT
GLOVE BIO SURGEON STRL SZ 6.5 (GLOVE) ×2 IMPLANT
GLOVE BIO SURGEONS STRL SZ 6.5 (GLOVE) ×1
GOWN STRL REUS W/ TWL LRG LVL3 (GOWN DISPOSABLE) ×2 IMPLANT
GOWN STRL REUS W/TWL LRG LVL3 (GOWN DISPOSABLE) ×4
INTRODUCER DILATOR DOUBLE (INTRODUCER) IMPLANT
KIT RM TURNOVER CYSTO AR (KITS) ×3 IMPLANT
LASER FIBER 365M SMARTSCOPE (Laser) ×3 IMPLANT
PACK CYSTO AR (MISCELLANEOUS) ×3 IMPLANT
PREP PVP WINGED SPONGE (MISCELLANEOUS) ×3 IMPLANT
PUMP SINGLE ACTION SAP (PUMP) IMPLANT
SENSORWIRE 0.038 NOT ANGLED (WIRE) ×9
SET CYSTO W/LG BORE CLAMP LF (SET/KITS/TRAYS/PACK) ×3 IMPLANT
SHEATH URETERAL 12FRX35CM (MISCELLANEOUS) IMPLANT
SOL .9 NS 3000ML IRR  AL (IV SOLUTION) ×2
SOL .9 NS 3000ML IRR UROMATIC (IV SOLUTION) ×1 IMPLANT
STENT URET 6FRX24 CONTOUR (STENTS) IMPLANT
STENT URET 6FRX26 CONTOUR (STENTS) ×3 IMPLANT
STRIP CLOSURE SKIN 1/2X4 (GAUZE/BANDAGES/DRESSINGS) ×2 IMPLANT
SURGILUBE 2OZ TUBE FLIPTOP (MISCELLANEOUS) ×3 IMPLANT
WATER STERILE IRR 1000ML POUR (IV SOLUTION) ×3 IMPLANT
WIRE SENSOR 0.038 NOT ANGLED (WIRE) ×3 IMPLANT

## 2015-10-25 NOTE — Progress Notes (Signed)
Inpatient Diabetes Program Recommendations  AACE/ADA: New Consensus Statement on Inpatient Glycemic Control (2015)  Target Ranges:  Prepandial:   less than 140 mg/dL      Peak postprandial:   less than 180 mg/dL (1-2 hours)      Critically ill patients:  140 - 180 mg/dL   Review of Glycemic Control:  Results for SCHNEIDER, GAVINS (MRN [id number]) as of 10/25/2015 12:31  Ref. Range 09/07/2015 06:07 09/07/2015 12:37 10/25/2015 06:23 10/25/2015 10:28  Glucose-Capillary Latest Ref Range: 65-99 mg/dL 139 (H) 161 (H) 174 (H) 166 (H)    Diabetes history: Type 2 diabetes Outpatient Diabetes medications:  Glucotrol 20 mg bid, Metformin 500 mg daily Current orders for Inpatient glycemic control:  Glucotrol 20 mg bid, Metformin 1000 mg with breakfast and 500 mg at [facility]  Inpatient Diabetes Program Recommendations:    Please consider holding diabetes oral agents while patient is in the hospital and adding Novolog sensitive correction tid with meals and HS.  Thanks, Adah Perl, RN, BC-ADM Inpatient Diabetes Coordinator Pager [phone]

## 2015-10-25 NOTE — Progress Notes (Signed)
MD paged twice regarding BP. Pt is asymptomatic. Will continue to assess. Pt took BP medications prior to surgery and also was given fentanyl for pain relief in PACU.

## 2015-10-25 NOTE — Op Note (Signed)
Date of procedure: 10/25/2015  Preoperative diagnosis:  1. Left ureteral stones 2. Atrophic left kidney with chronic obstruction  Postoperative diagnosis:  1. Same as above   Procedure: 1. Left ureteroscopy, laser lithotripsy 2. Basket extraction of stone fragment 3. Left ureteral stent exchange  Surgeon: Hollice Espy, MD  Anesthesia: General  Complications: None  Intraoperative findings: Several centimeters of steinstrasse within the distal ureter.  Ureter cleared at the end of the case.  EBL: Minimal  Specimens: Stone fragments not sent today as this is previously been done  Drains: 6 x 26 French double-J ureteral stent on left  Indication: [name] is a 65 y.o. patient with massive left ureteral stone burden, chronic obstruction and left renal atrophy he previously underwent left ureteroscopy and returns today for a staged procedure.  After reviewing the management options for treatment, he elected to proceed with the above surgical procedure(s). We have discussed the potential benefits and risks of the procedure, side effects of the proposed treatment, the likelihood of the patient achieving the goals of the procedure, and any potential problems that might occur during the procedure or recuperation. Informed consent has been obtained.  Description of procedure:  The patient was taken to the operating room and general anesthesia was induced.  The patient was placed in the dorsal lithotomy position, prepped and draped in the usual sterile fashion, and preoperative antibiotics were administered. A preoperative time-out was performed.   At this point in time, a 86 [place]) scope was advanced per urethra into the bladder. 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 Attention was then turned to the distal coil of the left ureteral stent which is seen emanating from the left UO. Stent graspers were then used to grab the distal end  of the coil and bring it to level of the urethral meatus. The stent was then cannulated using a sensor wire up to the level of the kidney without difficulty. This was snapped in place as a safety wire. A semirigid 4.5 French Wolf ureteroscope was then used to access the left distal ureter without difficulty. At this point in time, innumerable stone fragments with several centimeters worth of impacted steinstrasse were identified. A 365  laser fiber was then brought in and using settings of 0.8 J and 10 Hz, the stones were fragmented into smaller particles. A 1.9 French nitinol tip less basket was then used to extract each and every stone fragment. This was performed over the course of approximate 2-2.5 hours. Once the distal ureter was clear of stone, the scope was able to be advanced up to the iliac but not beyond. A second wire was then introduced up to level of the kidney. The flexible ureteroscope was then advanced up to level of the renal pelvis without difficulty alongside the safety wire. Formal pyeloscopy was then performed which revealed some amorphous debris within the kidney but no obvious stones. The scope was then backed down the length of the entire ureter and there was no residual stones or ureteral injuries noted. The proximal and mid ureter were completely clear. The distal ureter was fairly edematous where the previous stone was impacted but there was no obvious residual stones. At this point in time the scope was removed. The safety wire was backloaded over a rigid cystoscope. A 6 x 26 French double-J ureteral stent was advanced over the wire up to level of the renal pelvis. The wire was partially withdrawn until  full coil was noted within the renal pelvis. The wire was then fully withdrawn and a full coil was noted within the bladder. The bladder and all of the stone fragments were drained. The scope was removed. The patient was then repositioned the supine position, reversed from anesthesia,  taken to the PACU in stable condition. There are no complications.  Plan: Patient will be admitted overnight for social issues. He'll return to the office in 2 weeks for cystoscopy, stent removal.  Hollice Espy, M.D.

## 2015-10-25 NOTE — Anesthesia Preprocedure Evaluation (Addendum)
Anesthesia Evaluation  Patient identified by MRN, date of birth, ID band Patient awake    Reviewed: Allergy & Precautions, H&P , NPO status , Patient's Chart, lab work & pertinent test results, reviewed documented beta blocker date and time   History of Anesthesia Complications Negative for: history of anesthetic complications  Airway Mallampati: II  TM Distance: >3 FB Neck ROM: full    Dental no notable dental hx.    Pulmonary neg shortness of breath, sleep apnea , neg COPD, neg recent URI,    Pulmonary exam normal breath sounds clear to auscultation       Cardiovascular Exercise Tolerance: Good hypertension, + CAD and +CHF  Normal cardiovascular exam+ dysrhythmias Atrial Fibrillation  Rhythm:regular Rate:Normal     Neuro/Psych negative neurological ROS  negative psych ROS   GI/Hepatic negative GI ROS, Neg liver ROS,   Endo/Other  diabetes  Renal/GU CRFRenal disease  negative genitourinary   Musculoskeletal   Abdominal   Peds  Hematology negative hematology ROS (+)   Anesthesia Other Findings Past Medical History:   Chronic combined systolic (congestive) and dia*                Comment:a. EF 25% by cath in 2013 b. Echo in 07/2015               showing EF of 15-20%, moderate MR, moderate               Pulm HTN, severely dilated IVC   Sleep apnea                                                  Diabetes mellitus type 2, uncontrolled ([place])                   Comment:a. A1c 11.0 in 07/2015.   Hypertension                                                 New onset atrial fibrillation ([place])                            Comment:a. diagnosed in 07/2015 b. started on Eliquis   Coronary artery disease                                      Paroxysmal atrial fibrillation ([place])                         Cardiomyopathy ([place])                                         Psoriasis                                                   Pulmonary hypertension ([place])  Renal disorder                                                 Comment:kidney stone   CKD (chronic kidney disease) stage 3, GFR 30-5*              Kidney stones                                                  Comment:Left   Pollen allergies                                09-21-15        Comment:pt called and stated that he woke up and had               some drainage-pt states he did not see the               color of the drainage-pt denies running a fever              and this only happened once-pt instructed to               call Dr Audree Bane office if he starts running a               fever or if the color of drainage becomes               yellow/green  Patient has recently lost 80 pounds intentionally and had an ECHO on 5/11 that showed improved EF of 35-40%.  Reproductive/Obstetrics negative OB ROS                            Anesthesia Physical Anesthesia Plan  ASA: III  Anesthesia Plan: General   Post-op Pain Management:    Induction:   Airway Management Planned:   Additional Equipment:   Intra-op Plan:   Post-operative Plan:   Informed Consent: I have reviewed the patients History and Physical, chart, labs and discussed the procedure including the risks, benefits and alternatives for the proposed anesthesia with the patient or authorized representative who has indicated his/her understanding and acceptance.   Dental Advisory Given  Plan Discussed with: Anesthesiologist, CRNA and Surgeon  Anesthesia Plan Comments:         Anesthesia Quick Evaluation

## 2015-10-25 NOTE — Anesthesia Procedure Notes (Signed)
Procedure Name: Intubation Performed by: Lance Muss Pre-anesthesia Checklist: Patient identified, Emergency Drugs available, Suction available, Timeout performed and Patient being monitored Patient Re-evaluated:Patient Re-evaluated prior to inductionOxygen Delivery Method: Circle system utilized Preoxygenation: Pre-oxygenation with 100% oxygen Intubation Type: IV induction Ventilation: Two handed mask ventilation required and Oral airway inserted - appropriate to patient size Laryngoscope Size: Mac and 4 Grade View: Grade II Tube size: 7.5 mm Number of attempts: 1 Airway Equipment and Method: Stylet Placement Confirmation: ETT inserted through vocal cords under direct vision,  positive ETCO2 and breath sounds checked- equal and bilateral Secured at: 23 cm Tube secured with: Tape Dental Injury: Teeth and Oropharynx as per pre-operative assessment  Comments: Difficult mask due to beard.

## 2015-10-25 NOTE — H&P (Signed)
Expand All Collapse All     08/16/2015 5:18 PM   Kwasi Leeds 1950-12-16 [id number]  Referring provider: Elisabeth Cara, NP [place] [place], [place] [address]  Chief Complaint  Patient presents with  . New Patient (Initial Visit)    nephrostomy tube    HPI: 65 year old male with recent prolonged hospital administration for CHF (EF 15-20%) and A. Fib. He ultimately was diuresed and appropriate for discharge. During the hospital admission, he developed some left flank pain and underwent a CT scan. This revealed extensive left ureteral stone burden with chronic left kidney obstruction noted. He underwent left percutaneous nephrostomy tube placement in interventional radiology prior to discharge.  CT stone protocol shows numerous left ureteral calcifications including a 2.2 cm, 2.5 cm left proximal stone along with a 1 cm left distal stone. The left kidney is atrophic with hydronephrosis.   He does have baseline CKD, creatinine at baseline. No fevers or chills. No dysuria.   He does have an extensive personal history of kidney stones. He underwent ESWL 2 and ureteroscopy in the past but has not seen a urologist in over 10 years. He's had intermittent flank pain for numerous years but never sought intervention.  He returns today to discuss definitive management of his stones. His nephrostomy tube has been draining well with intermittent hematuria with activity. Fevers or chills. Mild flank discomfort from the tube.  He has seen his cardiologist, Dr. Yvone Neu as outpatient follow-up already and been cleared for surgery. He'll be able to hold his anticoagulation for 24 hours prior to the procedure (Eliquis).   PMH: Past Medical History  Diagnosis Date  . Chronic combined systolic (congestive) and diastolic (congestive) heart failure (HCC)     a. EF 25% by cath in 2013 b. Echo in 07/2015 showing EF of 15-20%, moderate MR, moderate Pulm HTN, severely dilated IVC   . Sleep apnea   . Diabetes mellitus type 2, uncontrolled ([place])     a. A1c 11.0 in 07/2015.  [name] Renal disorder     kidney stone  . Hypertension   . New onset atrial fibrillation ([place])     a. diagnosed in 07/2015 b. started on Eliquis  . CKD (chronic kidney disease) stage 3, GFR 30-59 ml/min   . Coronary artery disease   . Paroxysmal atrial fibrillation [facility])     Surgical History: Past Surgical History  Procedure Laterality Date  . Kidney surgery    . Coronary angioplasty with stent placement    . Cardiac catheterization N/A 07/25/2015    Procedure: Right/Left Heart Cath and Coronary Angiography; Surgeon: Wellington Hampshire, MD; Location: [facility]; Service: Cardiovascular; Laterality: N/A;  . Cardiac catheterization      [place]  . Cardiac catheterization      [facility], [place]    Home Medications:    Medication List       This list is accurate as of: 08/16/15 5:18 PM. Always use your most recent med list.              ALPRAZolam 0.25 MG tablet  Commonly known as: XANAX  Take 1 tablet (0.25 mg total) by mouth every 8 (eight) hours as needed for anxiety.     amiodarone 200 MG tablet  Commonly known as: PACERONE  Take 1 tablet (200 mg total) by mouth daily.     apixaban 5 MG Tabs tablet  Commonly known as: ELIQUIS  Take 1 tablet (5 mg total) by mouth 2 (two) times daily.  atorvastatin 40 MG tablet  Commonly known as: LIPITOR  Take 1 tablet (40 mg total) by mouth daily at 6 PM.     b complex vitamins tablet  Take 1 tablet by mouth daily.     BENEFIBER Chew  Chew 1 tablet by mouth daily as needed.     carvedilol 3.125 MG tablet  Commonly known as: COREG  Take 1 tablet (3.125 mg total) by mouth 2 (two) times daily with a meal.     cholecalciferol 1000 units tablet  Commonly known as: VITAMIN D  Take 2,000 Units by  mouth daily.     CO Q 10 PO  Take 400 mg by mouth daily.     Cranberry 500 MG Caps  Take 1,000 mg by mouth daily.     furosemide 80 MG tablet  Commonly known as: LASIX  Take 1 tablet (80 mg total) by mouth 2 (two) times daily.     glipiZIDE 10 MG tablet  Commonly known as: GLUCOTROL  Take 20-30 mg by mouth 2 (two) times daily. Pt takes three tablets in the morning and two tablets at bedtime.     Hawthorne Berry 550 MG Caps  Take 550 mg by mouth daily.     metFORMIN 500 MG tablet  Commonly known as: GLUCOPHAGE  Take 1,000 mg by mouth 2 (two) times daily with a meal.     metolazone 2.5 MG tablet  Commonly known as: ZAROXOLYN  Take 1 tablet (2.5 mg total) by mouth daily.     multivitamin-lutein Caps capsule  Take 1 capsule by mouth 2 (two) times daily.     Olive Leaf Extract 250 MG Caps  Take 250 mg by mouth 2 (two) times daily.     OVER THE COUNTER MEDICATION  Take 3 capsules by mouth 2 (two) times daily. Pt takes beet root.     OVER THE COUNTER MEDICATION  Take 1 capsule by mouth daily. Pt takes caprylic acid.     oxyCODONE-acetaminophen 5-325 MG tablet  Commonly known as: PERCOCET/ROXICET  Take 1 tablet by mouth every 4 (four) hours as needed for moderate pain.     polyethylene glycol packet  Commonly known as: MIRALAX / GLYCOLAX  Take 17 g by mouth daily as needed for mild constipation.     potassium phosphate (monobasic) 500 MG tablet  Commonly known as: K-PHOS ORIGINAL  Take 500 mg by mouth 2 (two) times daily with a meal.     spironolactone 25 MG tablet  Commonly known as: ALDACTONE  Take 25 mg by mouth 2 (two) times daily.     TURMERIC CURCUMIN PO  Take 1 capsule by mouth daily.     vitamin C 500 MG tablet  Commonly known as: ASCORBIC ACID  Take 1,000 mg by mouth daily.     vitamin E 400 UNIT capsule  Take 400 Units by mouth daily.        Allergies: No  Known Allergies  Family History: Family History  Problem Relation Age of Onset  . Heart failure Father   . Heart attack Brother     Social History:  reports that he has never smoked. He has never used smokeless tobacco. He reports that he drinks alcohol. He reports that he does not use illicit drugs.  ROS: UROLOGY Frequent Urination?: Yes Hard to postpone urination?: No Burning/pain with urination?: Yes Get up at night to urinate?: Yes Leakage of urine?: No Urine stream starts and stops?: Yes Trouble starting stream?: No Do  you have to strain to urinate?: No Blood in urine?: Yes Urinary tract infection?: No Sexually transmitted disease?: No Injury to kidneys or bladder?: No Painful intercourse?: No Weak stream?: No Erection problems?: Yes Penile pain?: No  Gastrointestinal Nausea?: No Vomiting?: No Indigestion/heartburn?: No Diarrhea?: No Constipation?: No  Constitutional Fever: No Night sweats?: No Weight loss?: No Fatigue?: Yes  Skin Skin rash/lesions?: Yes Itching?: Yes  Eyes Blurred vision?: Yes Double vision?: Yes  Ears/Nose/Throat Sore throat?: No Sinus problems?: No  Hematologic/Lymphatic Swollen glands?: No Easy bruising?: Yes  Cardiovascular Leg swelling?: Yes Chest pain?: No  Respiratory Cough?: Yes Shortness of breath?: Yes  Endocrine Excessive thirst?: Yes  Musculoskeletal Back pain?: Yes Joint pain?: No  Neurological Headaches?: Yes Dizziness?: Yes  Psychologic Depression?: Yes Anxiety?: Yes  Physical Exam: BP 99/69 mmHg  Pulse 90  Ht 6' (1.829 m)  Wt 269 lb 6.4 oz (122.199 kg)  BMI 36.53 kg/m2  Constitutional: Alert and oriented, No acute distress. HEENT: [place] AT, moist mucus membranes. Trachea midline, no masses. Cardiovascular: No clubbing, cyanosis. Irregular rhythm. Respiratory: Normal respiratory effort, no increased work of breathing. CTAB. GI: Abdomen is soft, nontender, nondistended, no  abdominal masses. Obese. GU: No CVA tenderness. Left percutaneous nephrostomy tube draining light pink urine. Left nephrostomy tube site with small dried adherent clot, granulation tissue around tube.  Skin: No rashes, bruises or suspicious lesions. Neurologic: Grossly intact, no focal deficits, moving all 4 extremities. Psychiatric: Normal mood and affect.  Laboratory Data:  Recent Labs    Lab Results  Component Value Date   WBC 6.6 07/31/2015   HGB 11.6* 07/31/2015   HCT 34.3* 07/31/2015   MCV 93.4 07/31/2015   PLT 183 07/31/2015       Recent Labs    Lab Results  Component Value Date   CREATININE 1.74* 08/08/2015       Recent Labs    Lab Results  Component Value Date   HGBA1C 11.0* 07/08/2015      Urinalysis Pending, UCx from left nephrostomy tube collected  Pertinent Imaging: Study Result     CLINICAL DATA: Left flank pain. History kidney stones.  EXAM: CT ABDOMEN AND PELVIS WITHOUT CONTRAST  TECHNIQUE: Multidetector CT imaging of the abdomen and pelvis was performed following the standard protocol without IV contrast.  COMPARISON: Renal ultrasound and abdominal radiographs 1 day prior.  FINDINGS: Lower chest: Heart mildly enlarged. Coronary artery calcifications are seen. There is a small left pleural effusion.  Liver: Enlarged with diffusely decreased density consistent with steatosis.  Hepatobiliary: Gallbladder physiologically distended, no calcified stone. No biliary dilatation.  Pancreas: No ductal dilatation or inflammation.  Spleen: Normal in size measuring 11.6 cm greatest dimension.  Adrenal glands: No nodule.  Kidneys: There are 2 large stones in the mid proximal left ureter. More proximal stone measures 1.3 x 1.1 x 2.2 cm, with an adjacent stone measuring 2.5 x 1.6 x 1.4 cm. Moderate proximal hydroureteronephrosis with increased density of the urine in the renal  collecting system, suggestive of blood. Ureter distal to this is decompressed, however is an additional 0.9 x 1.0 x 0.6 cm stone in the left distal ureter just proximal to the ureterovesicular junction. There is thinning of the left renal parenchyma. Small nonobstructing stone in the lower left kidney. Cyst in the interpolar left kidney measures 3.9 cm. No stones or obstructive uropathy of the right kidney. Right ureter is decompressed.  Stomach/Bowel: Limited bowel assessment secondary to lack of enteric contrast, intra-abdominal ascites, and soft tissue attenuation  from body habitus. Stomach physiologically distended with ingested contents. There are no dilated or thickened small bowel loops. Small volume of stool throughout the colon without colonic wall thickening. The appendix is not confidently identified.  Vascular/Lymphatic: No retroperitoneal adenopathy. Abdominal aorta is normal in caliber moderate aortic atherosclerosis without aneurysm.  Reproductive: Prostatic calcifications.  Bladder: Decompressed, probable intraluminal blood.  Other: Moderate volume of intra-abdominal and pelvic ascites. Free fluid in the mesenteric, pelvis, both pericolic gutters in both upper quadrants. Mild mesenteric and whole body wall edema. No free air. No loculated intra-abdominal fluid collection.  Musculoskeletal: There are no acute or suspicious osseous abnormalities. Facet arthropathy in the lower lumbar spine.  IMPRESSION: 1. Left hydroureteronephrosis with 2 large stones in the mid proximal ureter measuring 2.2 and 2.5 cm. Ureter distal to this is decompressed, however there is an additional 1 cm stone in the distal left ureter, just proximal to the ureterovesicular junction. Increased density of the urine in the bladder and left renal collecting system consistent with hemorrhage. 2. Moderate volume intra-abdominal ascites. There is hepatic steatosis. 3. Atherosclerosis,  including coronary artery calcifications. Cardiomegaly and small left effusion in the included lung bases.   Electronically Signed  By: Jeb Levering M.D.  On: 07/25/2015 19:45     Assessment & Plan:   1. Left ureteral stone CT scan reviewed today again. Significant left ureteral calcification occupying large portion of ureter. Again, we discussed various options including continued management with percutaneous nephrostomy tube versus intervention. He would like to have the stones treated. There does appear to be some left renal parenchyma that could be salvaged despite atrophy. Next  We discussed surgical plane today extensively. Given his history of anticoagulation, obesity, and comorbidities, I prefer not to treat the stones in antegrade fashion. We will attempt to do staged ureteroscopy to clear his ureter. I have previously spoken with interventional radiology who has agreed to attempt to place an antegrade wire/nephroureteral stent on the day of the procedure as a safety as this is more likely to pass an antegrade fashion as a posterior retrograde. Patient is aware that he will likely require more than one procedure. Risks of surgery including risk of bleeding, infection, damage to surrounding structures including his ureter were discussed in detail. Additionally, he is somewhat high risk for general anesthesia and we have already obtained cardiac clearance.  Preop urine culture from left nephrostomy. We will obtain a voided urine culture from previous and testing.  His questions were answered today.  - CULTURE, URINE COMPREHENSIVE - IR URETERAL STENT PLACEMENT EXISTING ACCESS LEFT; Future  2. Hydronephrosis, left Obstructing ureteral stones  3. Left renal atrophy As above  4. Acute on chronic combined systolic and diastolic CHF (congestive heart failure) ([place]) Comorbidity   Schedule surgery  Hollice Espy, MD  [facility] [address], [place], [place] [address] [phone]  I spent 25 min with this patient of which greater than 50% was spent in counseling and coordination of care with the patient.           Updated 10/25/15- s/p L URS, LL, stent on 09/07/15.  Scheduled on previous occation or stage 2 but cancelled.  Returns today for staged URS.  Interval UCx negative 09/30/15.      Hollice Espy, MD

## 2015-10-25 NOTE — Discharge Instructions (Signed)
You have a ureteral stent in place.  This is a tube that extends from your kidney to your bladder.  This may cause urinary bleeding, burning with urination, and urinary frequency.  Please call our office or present to the ED if you develop fevers >101 or pain which is not able to be controlled with oral pain medications.  You may be given either Flomax and/ or ditropan to help with bladder spasms and stent pain in addition to pain medications.    Your stent will need to be removed in the office in 2 weeks. It's very important that you follow-up for this visit. Failure to do so could lead to permanent kidney damage.  [facility] [address] [place], [place] [address] [phone]

## 2015-10-25 NOTE — Transfer of Care (Signed)
Immediate Anesthesia Transfer of Care Note  Patient: [name]  Procedure(s) Performed: Procedure(s): CYSTOSCOPY/URETEROSCOPY/HOLMIUM LASER/STENT EXCHANGE (Left)  Patient Location: PACU  Anesthesia Type:General  Level of Consciousness: awake, alert  and responds to stimulation  Airway & Oxygen Therapy: Patient Spontanous Breathing and Patient connected to face mask oxygen  Post-op Assessment: Report given to RN and Post -op Vital signs reviewed and stable  Post vital signs: Reviewed and stable  Last Vitals:  Filed Vitals:   10/25/15 1006 10/25/15 1007  BP: 131/69 131/69  Pulse: 83 82  Temp: 36.4 C   Resp: 13 13    Last Pain: There were no vitals filed for this visit.    Patients Stated Pain Goal: 0 ([id number])  Complications: No apparent anesthesia complications

## 2015-10-25 NOTE — Consult Note (Addendum)
[facility] [place] NAME: [name]    MR#:  [id number]  DATE OF BIRTH:  [birth year]  DATE OF ADMISSION:  10/25/2015  PRIMARY CARE PHYSICIAN: Vidal Schwalbe, MD   REQUESTING/REFERRING PHYSICIAN: Dr. Erlene Quan  CHIEF COMPLAINT:  No chief complaint on file.   HISTORY OF PRESENT ILLNESS: [name]  is a 65 y.o. male with a known history of congestive heart failure with ejection fraction of 15-20% in February 2017, which came out after medical management up to 35-40% in May 2017. Diabetes, hypertension, atrial fibrillation, chronic kidney disease, left kidney stone- was admitted for severe CHF in February 2017 and was found to have left ureteral stone on the left side. He was discharged with her nephrostomy tube, and was called in for laser surgery for stones in April 2017. At that time the nephrostomy tube was removed and ureteral stent was placed and he was called in for repeat procedure today for the remaining stones. Today the procedure was done but after that his blood pressure was noted slightly on the lower side so instead of discharging him urology admitted the patient and called medical consult.  Patient denies any complaints. He confirms that since February he has been following regularly with cardiologist and he is on many cardiac medications, he was feeling dizzy and so cardiologist to be adjusted the doses of medications a few days ago. And since then patient is feeling much better and he is not feeling dizzy, he confirms that at that time and he was dizzy he checked his blood pressure and it was running even lower than what it was today. His blood pressure with all these medications runs in the 90s all the time and it does not bother the patient, and as per him cardiologist told that he needed to be on all these medications.  PAST MEDICAL HISTORY:   Past Medical History  Diagnosis Date  . Chronic combined systolic (congestive) and diastolic  (congestive) heart failure (HCC)     a. EF 25% by cath in 2013 b. Echo in 07/2015 showing EF of 15-20%, moderate MR, moderate Pulm HTN, severely dilated IVC  . Sleep apnea   . Diabetes mellitus type 2, uncontrolled ([place])     a. A1c 11.0 in 07/2015.  [name] Hypertension   . New onset atrial fibrillation ([place])     a. diagnosed in 07/2015 b. started on Eliquis  . Coronary artery disease   . Paroxysmal atrial fibrillation (HCC)   . Cardiomyopathy ([place])   . Psoriasis   . Pulmonary hypertension ([place])   . Renal disorder     kidney stone  . CKD (chronic kidney disease) stage 3, GFR 30-59 ml/min   . Kidney stones     Left  . Pollen allergies 09-21-15    pt called and stated that he woke up and had some drainage-pt states he did not see the color of the drainage-pt denies running a fever and this only happened once-pt instructed to call Dr Audree Bane office if he starts running a fever or if the color of drainage becomes yellow/green    PAST SURGICAL HISTORY: Past Surgical History  Procedure Laterality Date  . Kidney surgery    . Coronary angioplasty with stent placement    . Cardiac catheterization N/A 07/25/2015    Procedure: Right/Left Heart Cath and Coronary Angiography;  Surgeon: Wellington Hampshire, MD;  Location: [facility];  Service: Cardiovascular;  Laterality: N/A;  . Cardiac  catheterization      [place]  . Cardiac catheterization      [facility], [place]  . Nephrostomy tube placement ([facility] hx) Left   . Ureteroscopy with holmium laser lithotripsy Left 09/07/2015    Procedure: URETEROSCOPY WITH HOLMIUM LASER LITHOTRIPSY;  Surgeon: Hollice Espy, MD;  Location: [facility];  Service: Urology;  Laterality: Left;  . Cystoscopy with stent placement Left 09/07/2015    Procedure: CYSTOSCOPY WITH STENT PLACEMENT;  Surgeon: Hollice Espy, MD;  Location: [facility];  Service: Urology;  Laterality: Left;    SOCIAL HISTORY:  Social History  Substance Use Topics  . Smoking status: Never  Smoker   . Smokeless tobacco: Never Used  . Alcohol Use: 0.0 oz/week    0 Standard drinks or equivalent per week     Comment: occasionally    FAMILY HISTORY:  Family History  Problem Relation Age of Onset  . Heart failure Father   . Heart attack Brother     DRUG ALLERGIES: No Known Allergies  REVIEW OF SYSTEMS:   CONSTITUTIONAL: No fever, fatigue or weakness.  EYES: No blurred or double vision.  EARS, NOSE, AND THROAT: No tinnitus or ear pain.  RESPIRATORY: No cough, shortness of breath, wheezing or hemoptysis.  CARDIOVASCULAR: No chest pain, orthopnea, edema.  GASTROINTESTINAL: No nausea, vomiting, diarrhea or abdominal pain.  GENITOURINARY: No dysuria, hematuria.  ENDOCRINE: No polyuria, nocturia,  HEMATOLOGY: No anemia, easy bruising or bleeding SKIN: No rash or lesion. MUSCULOSKELETAL: No joint pain or arthritis.   NEUROLOGIC: No tingling, numbness, weakness.  PSYCHIATRY: No anxiety or depression.   MEDICATIONS AT HOME:  Prior to Admission medications   Medication Sig Start Date End Date Taking? Authorizing Provider  ALPRAZolam (XANAX) 0.25 MG tablet Take 1 tablet (0.25 mg total) by mouth every 8 (eight) hours as needed for anxiety. 08/01/15  Yes Lytle Butte, MD  amiodarone (PACERONE) 200 MG tablet Take 1 tablet (200 mg total) by mouth daily. Patient taking differently: Take 200 mg by mouth every morning.  08/01/15  Yes Lytle Butte, MD  apixaban (ELIQUIS) 5 MG TABS tablet Take 1 tablet (5 mg total) by mouth 2 (two) times daily. 09/22/15  Yes Wende Bushy, MD  atorvastatin (LIPITOR) 40 MG tablet Take 1 tablet (40 mg total) by mouth daily at 6 PM. 08/01/15  Yes Lytle Butte, MD  b complex vitamins tablet Take 1 tablet by mouth daily.   Yes Historical Provider, MD  carvedilol (COREG) 6.25 MG tablet Take 1 tablet (6.25 mg total) by mouth 2 (two) times daily. 08/22/15  Yes Deboraha Sprang, MD  cholecalciferol (VITAMIN D) 1000 units tablet Take 2,000 Units by mouth daily.   Yes  Historical Provider, MD  Coenzyme Q10 (CO Q 10 PO) Take 400 mg by mouth daily.   Yes Historical Provider, MD  Cranberry 500 MG CAPS Take 1,000 mg by mouth daily.   Yes Historical Provider, MD  furosemide (LASIX) 80 MG tablet Take 1 tablet (80 mg total) by mouth 2 (two) times daily. 08/01/15  Yes Lytle Butte, MD  glipiZIDE (GLUCOTROL) 10 MG tablet Take 20 mg by mouth 2 (two) times daily. Pt takes three tablets in the morning and two tablets at bedtime.   Yes Historical Provider, MD  Cathrine Muster 550 MG CAPS Take 550 mg by mouth daily.   Yes Historical Provider, MD  HYDROcodone-acetaminophen (NORCO/VICODIN) 5-325 MG tablet Take 1-2 tablets by mouth every 6 (six) hours as needed for moderate pain. 09/07/15  Yes Hollice Espy, MD  losartan (COZAAR) 25 MG tablet Take 1 tablet (25 mg total) by mouth daily. Patient taking differently: Take 25 mg by mouth at bedtime.  08/22/15  Yes Deboraha Sprang, MD  metFORMIN (GLUCOPHAGE) 500 MG tablet Take 1,000 mg by mouth daily with breakfast.    Yes Historical Provider, MD  metFORMIN (GLUCOPHAGE) 500 MG tablet Take 500 mg by mouth at bedtime.   Yes Historical Provider, MD  multivitamin-lutein (OCUVITE-LUTEIN) CAPS capsule Take 1 capsule by mouth 2 (two) times daily.   Yes Historical Provider, MD  OVER THE COUNTER MEDICATION Take 3 capsules by mouth 2 (two) times daily. Pt takes beet root.   Yes Historical Provider, MD  OVER THE COUNTER MEDICATION Take 1 capsule by mouth daily. Pt takes caprylic acid.   Yes Historical Provider, MD  oxyCODONE-acetaminophen (PERCOCET/ROXICET) 5-325 MG tablet Take 1 tablet by mouth every 4 (four) hours as needed for moderate pain. 08/01/15  Yes Lytle Butte, MD  polyethylene glycol [facility] / GLYCOLAX) packet Take 17 g by mouth daily as needed for mild constipation.   Yes Historical Provider, MD  potassium phosphate, monobasic, (K-PHOS ORIGINAL) 500 MG tablet Take 1 tablet (500 mg total) by mouth daily. 09/29/15  Yes Alisa Graff, FNP   spironolactone (ALDACTONE) 25 MG tablet Take 1 tablet (25 mg total) by mouth daily. 09/29/15  Yes Alisa Graff, FNP  TURMERIC CURCUMIN PO Take 1 capsule by mouth daily.   Yes Historical Provider, MD  vitamin C (ASCORBIC ACID) 500 MG tablet Take 1,000 mg by mouth daily.   Yes Historical Provider, MD  vitamin E 400 UNIT capsule Take 400 Units by mouth daily.   Yes Historical Provider, MD  Wheat Dextrin (BENEFIBER) CHEW Chew 1 tablet by mouth daily as needed.   Yes Historical Provider, MD  HYDROcodone-acetaminophen (NORCO/VICODIN) 5-325 MG tablet Take 1-2 tablets by mouth every 6 (six) hours as needed for moderate pain. 10/25/15   Hollice Espy, MD  oxybutynin (DITROPAN) 5 MG tablet Take 1 tablet (5 mg total) by mouth every 8 (eight) hours as needed for bladder spasms. 10/25/15   Hollice Espy, MD  tamsulosin (FLOMAX) 0.4 MG CAPS capsule Take 1 capsule (0.4 mg total) by mouth daily. 10/25/15   Hollice Espy, MD      PHYSICAL EXAMINATION:   VITAL SIGNS: Blood pressure 92/41, pulse 69, temperature 97.7 F (36.5 C), temperature source Oral, resp. rate 18, height 5\' 11"  (1.803 m), weight 113.399 kg (250 lb), SpO2 99 %.  GENERAL:  65 y.o.-year-old patient lying in the bed with no acute distress.  EYES: Pupils equal, round, reactive to light and accommodation. No scleral icterus. Extraocular muscles intact.  HEENT: Head atraumatic, normocephalic. Oropharynx and nasopharynx clear.  NECK:  Supple, no jugular venous distention. No thyroid enlargement, no tenderness.  LUNGS: Normal breath sounds bilaterally, no wheezing, rales,rhonchi or crepitation. No use of accessory muscles of respiration.  CARDIOVASCULAR: S1, S2 normal. No murmurs, rubs, or gallops.  ABDOMEN: Soft, nontender, nondistended. Bowel sounds present. No organomegaly or mass.  EXTREMITIES: No pedal edema, cyanosis, or clubbing.  NEUROLOGIC: Cranial nerves II through XII are intact. Muscle strength 5/5 in all extremities. Sensation  intact. Gait not checked.  PSYCHIATRIC: The patient is alert and oriented x 3.  SKIN: No obvious rash, lesion, or ulcer.   LABORATORY PANEL:   CBC  Recent Labs Lab 10/25/15 0654  HGB 13.3  HCT 39.0   ------------------------------------------------------------------------------------------------------------------  Chemistries   Recent Labs Lab 10/25/15  0654  NA 136  K 3.8  GLUCOSE 158*   ------------------------------------------------------------------------------------------------------------------ CrCl cannot be calculated (Patient has no serum creatinine result on file.). ------------------------------------------------------------------------------------------------------------------ No results for input(s): TSH, T4TOTAL, T3FREE, THYROIDAB in the last 72 hours.  Invalid input(s): FREET3   Coagulation profile No results for input(s): INR, PROTIME in the last 168 hours. ------------------------------------------------------------------------------------------------------------------- No results for input(s): DDIMER in the last 72 hours. -------------------------------------------------------------------------------------------------------------------  Cardiac Enzymes No results for input(s): CKMB, TROPONINI, MYOGLOBIN in the last 168 hours.  Invalid input(s): CK ------------------------------------------------------------------------------------------------------------------ Invalid input(s): POCBNP  ---------------------------------------------------------------------------------------------------------------  Urinalysis    Component Value Date/Time   COLORURINE YELLOW* 07/25/2015 2025   APPEARANCEUR CLEAR* 07/25/2015 2025   LABSPEC 1.020 07/25/2015 2025   PHURINE 5.0 07/25/2015 2025   GLUCOSEU NEGATIVE 07/25/2015 2025   HGBUR 3+* 07/25/2015 2025   BILIRUBINUR NEGATIVE 07/25/2015 2025   KETONESUR NEGATIVE 07/25/2015 2025   PROTEINUR NEGATIVE 07/25/2015  2025   NITRITE NEGATIVE 07/25/2015 2025   LEUKOCYTESUR 1+* 07/25/2015 2025     RADIOLOGY: No results found.  EKG: Orders placed or performed in visit on 09/22/15  . EKG 12-Lead    IMPRESSION AND PLAN:  * Hypotension   Secondary to multiple cardiac and hypertensive medications.     As patient said he was asymptomatic with blood pressure running in [id number] or 0000000 systolic   He said a few weeks ago when he was running low blood pressure even lower than this, he was feeling dizzy and so he was taken off from some of the medicines and adjustments were done in the doses. And now he feels fine, so he would like to continue the same regimen of the medicines as long as he is not feeling symptomatic, because his cardiologist told all these medications are very necessary for his congestive heart failure.  Currently blood pressure is again in 90s, I will decrease the dose of carvedilol and continue all other medications.  * Severe chronic systolic congestive heart failure   Currently no exacerbation symptoms.   Continue medications.  * Left renal stones   Manage per primary team.  * Diabetes   Continue glipizide and metformin.  * Atrial fibrillation   Rate is controlled with amiodarone and carvedilol.  * Hyperlipidemia   Continue stating.  All the records are reviewed and case discussed with ED provider. Management plans discussed with the patient, family and they are in agreement.  CODE STATUS: full.    Code Status Orders        Start     Ordered   10/25/15 1119  Full code   Continuous     10/25/15 1118    Code Status History    Date Active Date Inactive Code Status Order ID Comments User Context   07/15/2015 11:54 PM 08/01/2015  6:16 PM Full Code [id number]  Domingo Pulse [place], RN Inpatient   07/08/2015 10:28 PM 07/15/2015 11:54 PM Full Code [id number]  Demetrios Loll, MD Inpatient       TOTAL TIME TAKING CARE OF THIS PATIENT: 45 minutes.    Vaughan Basta M.D on  10/25/2015   Between 7am to 6pm - Pager - [phone]  After 6pm go to [email or web address] - password [email or web address] [facility]  Office  [phone]  CC: Primary care physician; Vidal Schwalbe, MD   Note: This dictation was prepared with Dragon dictation along with smaller phrase technology. Any transcriptional errors that result from this process are unintentional.

## 2015-10-26 DIAGNOSIS — E1122 Type 2 diabetes mellitus with diabetic chronic kidney disease: Secondary | ICD-10-CM | POA: Diagnosis not present

## 2015-10-26 DIAGNOSIS — N201 Calculus of ureter: Secondary | ICD-10-CM

## 2015-10-26 NOTE — Progress Notes (Signed)
10/26/2015  BP 114/61 mmHg  Pulse 68  Temp(Src) 98.6 F (37 C) (Oral)  Resp 18  Ht 5\' 11"  (1.803 m)  Wt 113.399 kg (250 lb)  BMI 34.88 kg/m2  SpO2 99% Patient discharged per MD orders. Discharge instructions reviewed with patient and patient verbalized understanding. IV removed per policy. Prescriptions discussed and given to patient. Discharged via wheelchair escorted by auxilary.  Almedia Balls, RN

## 2015-10-26 NOTE — Anesthesia Postprocedure Evaluation (Signed)
Anesthesia Post Note  Patient: Eastan Kaluza  Procedure(s) Performed: Procedure(s) (LRB): CYSTOSCOPY/URETEROSCOPY/HOLMIUM LASER/STENT EXCHANGE (Left)  Patient location during evaluation: PACU Anesthesia Type: General Level of consciousness: awake and alert Pain management: pain level controlled Vital Signs Assessment: post-procedure vital signs reviewed and stable Respiratory status: spontaneous breathing, nonlabored ventilation, respiratory function stable and patient connected to nasal cannula oxygen Cardiovascular status: blood pressure returned to baseline and stable Postop Assessment: no signs of nausea or vomiting Anesthetic complications: no    Last Vitals:  Filed Vitals:   10/26/15 0449 10/26/15 0835  BP: 93/56 114/61  Pulse: 63 68  Temp: 37 C   Resp: 20 18    Last Pain:  Filed Vitals:   10/26/15 0852  PainSc: Asleep                 Martha Clan

## 2015-10-26 NOTE — Care Management (Signed)
Care team has not identified any discharge needs.  Anticipate discharge today

## 2015-10-26 NOTE — Progress Notes (Signed)
[facility] [place] NAME: [name]    MR#:  [id number]  DATE OF BIRTH:  June 21, 1950  SUBJECTIVE:  CHIEF COMPLAINT:  No chief complaint on file.  No abdominal pain. No lightheadedness or shortness of breath.  He mentions he did not have a ride home yesterday.  REVIEW OF SYSTEMS:    Review of Systems  Constitutional: Negative for fever and chills.  HENT: Negative for sore throat.   Eyes: Negative for blurred vision, double vision and pain.  Respiratory: Negative for cough, hemoptysis, shortness of breath and wheezing.   Cardiovascular: Negative for chest pain, palpitations, orthopnea and leg swelling.  Gastrointestinal: Negative for heartburn, nausea, vomiting, abdominal pain, diarrhea and constipation.  Genitourinary: Negative for dysuria and hematuria.  Musculoskeletal: Negative for back pain and joint pain.  Skin: Negative for rash.  Neurological: Negative for sensory change, speech change, focal weakness and headaches.  Endo/Heme/Allergies: Does not bruise/bleed easily.  Psychiatric/Behavioral: Negative for depression. The patient is not nervous/anxious.     DRUG ALLERGIES:  No Known Allergies  VITALS:  Blood pressure 114/61, pulse 68, temperature 98.6 F (37 C), temperature source Oral, resp. rate 18, height 5\' 11"  (1.803 m), weight 113.399 kg (250 lb), SpO2 99 %.  PHYSICAL EXAMINATION:   Physical Exam  GENERAL:  65 y.o.-year-old patient lying in the bed with no acute distress. Obese EYES: Pupils equal, round, reactive to light and accommodation. No scleral icterus. Extraocular muscles intact.  HEENT: Head atraumatic, normocephalic. Oropharynx and nasopharynx clear.  NECK:  Supple, no jugular venous distention. No thyroid enlargement, no tenderness.  LUNGS: Normal breath sounds bilaterally, no wheezing, rales, rhonchi. No use of accessory muscles of respiration.  CARDIOVASCULAR: S1, S2 normal. No murmurs, rubs, or  gallops.  ABDOMEN: Soft, nontender, nondistended. Bowel sounds present. No organomegaly or mass.  EXTREMITIES: No cyanosis, clubbing or edema b/l.    NEUROLOGIC: Cranial nerves II through XII are intact. No focal Motor or sensory deficits b/l.   PSYCHIATRIC: The patient is alert and oriented x 3.  SKIN: No obvious rash, lesion, or ulcer.   LABORATORY PANEL:   CBC  Recent Labs Lab 10/25/15 0654  HGB 13.3  HCT 39.0   ------------------------------------------------------------------------------------------------------------------ Chemistries   Recent Labs Lab 10/25/15 0654  NA 136  K 3.8  GLUCOSE 158*   ------------------------------------------------------------------------------------------------------------------  Cardiac Enzymes No results for input(s): TROPONINI in the last 168 hours. ------------------------------------------------------------------------------------------------------------------  RADIOLOGY:  No results found.   ASSESSMENT AND PLAN:   * Severe chronic systolic congestive heart failure  Currently no exacerbation symptoms.  Continue medications.  Patient does have chronic low normal blood pressure. Will not change any of the medications. Will need follow-up with PCP in 1 week.  * Left renal stones  Manage per primary team.  * Diabetes  Continue glipizide and metformin.  * Atrial fibrillation  Rate is controlled with amiodarone and carvedilol.  Possible discharge later today  Management plans discussed with the patient and in agreement.   DVT Prophylaxis: SCDs  TOTAL TIME TAKING CARE OF THIS PATIENT: 20 minutes.   [name] R M.D on 10/26/2015 at 11:17 AM  Between 7am to 6pm - Pager - [phone]  After 6pm go to [email or web address] - password [email or web address] [place] [facility]  Office  [phone]  CC: Primary care physician; Jim Schwalbe, MD  Note: This dictation was prepared with Dragon dictation along with  smaller phrase technology. Any transcriptional errors that result from this process  are unintentional.

## 2015-10-26 NOTE — Discharge Summary (Signed)
Date of admission: 10/25/2015   Date of discharge: 10/26/2015  Admission diagnosis: Left ureteral stone  Discharge diagnosis: Left ureteral stone, hypotension  Secondary diagnoses:  Patient Active Problem List   Diagnosis Date Noted  . Left ureteral stone 10/25/2015  . Atherosclerosis of native coronary artery of native heart without angina pectoris 09/22/2015  . First degree AV block 09/22/2015  . Essential hypertension, benign 09/22/2015  . Hyperlipidemia 09/22/2015  . Hypotension 08/09/2015  . Chronic systolic heart failure ([place]) 08/08/2015  . Hydronephrosis, left   . Kidney stone on left side   . OSA (obstructive sleep apnea)   . Hyponatremia 07/13/2015  . Uncontrolled type 2 diabetes mellitus ([place]) 07/13/2015  . Pulmonary hypertension ([place])   . Cardiorenal syndrome   . Morbid obesity due to excess calories ([place])   . Paroxysmal atrial fibrillation (HCC)     History and Physical: For full details, please see admission history and physical. Briefly, [name] is a 65 y.o. year old patient with Large burden of left ureteral stones who was admitted following left ureteroscopy for social reasons.  Hospital Course: Patient tolerated the procedure well.  He was then transferred to the floor after an uneventful PACU stay.  Once he arrived to the floor, his blood pressure was noted to be low and he was seen by medicine for further evaluation. His medications were adjusted and he was noted to have chronic asymptomatic hypotension not needing further intervention or workup.  His hospital course was otherwise uncomplicated.  On POD#1 he had met discharge criteria: was eating a regular diet, was up and ambulating independently,  pain was well controlled, was voiding without a catheter, and was ready to for discharge.  Physical Exam  Constitutional: [name] oriented to person, place, and time. He appears well-developed and well-nourished.  HENT:  Head: Normocephalic and atraumatic.   Cardiovascular: Normal rate.   Pulmonary/Chest: Effort normal.  Abdominal: Soft. Bowel sounds are normal.  Neurological: [name] alert and oriented to person, place, and time.  Skin: Skin is warm and dry.  Vitals reviewed.   Laboratory values:   Recent Labs  10/25/15 0654  HGB 13.3  HCT 39.0    Recent Labs  10/25/15 0654  NA 136  K 3.8  GLUCOSE 158*   No results for input(s): LABPT, INR in the last 72 hours. No results for input(s): LABURIN in the last 72 hours. Results for orders placed or performed during the hospital encounter of 09/07/15  Stone analysis     Status: None   Collection Time: 09/07/15 11:11 AM  Result Value Ref Range Status   Composition Comment  Corrected    Comment: Percentage (Represents the % composition)   Nidus No Nidus visualized  Corrected   Stone Weight KSTONE 78.0 mg Final   Color Brown  Final   Size Comment mm Final    Comment: Specimens received as a mixture of whole stones and fragments.   Ca Oxalate,Monohydr. 90 % Corrected   CA PHOS CRY STONE QL IR 5 % Corrected   COMMENT Comment:  Corrected    Comment: 5% of specimen consists of an unknown urate.   STONE COMMENT Note:  Corrected    Comment: (NOTE) Please do not submit specimens on Q-Tips, in tape, on filters, or in liquids such as blood, urine or formalin.  This may cause unnecessary biohazards, erroneous results and/or delay in the processing of the specimen.    Photo Comment  Corrected    Comment: Photograph  will follow under separate cover.   COMMENT: Comment  Corrected    Comment: (NOTE) Physician questions regarding [facility] Analysis contact [facility] at: [phone].    PLEASE NOTE: Comment  Corrected    Comment: (NOTE) [facility] report with photograph will follow via computer, mail or courier delivery.     Disposition: Home  Discharge instruction: The patient was instructed to be ambulatory but told to refrain from heavy lifting, strenuous activity, or driving.    Discharge medications:   Medication List    TAKE these medications        ALPRAZolam 0.25 MG tablet  Commonly known as:  XANAX  Take 1 tablet (0.25 mg total) by mouth every 8 (eight) hours as needed for anxiety.     amiodarone 200 MG tablet  Commonly known as:  PACERONE  Take 1 tablet (200 mg total) by mouth daily.     apixaban 5 MG Tabs tablet  Commonly known as:  ELIQUIS  Take 1 tablet (5 mg total) by mouth 2 (two) times daily.     atorvastatin 40 MG tablet  Commonly known as:  LIPITOR  Take 1 tablet (40 mg total) by mouth daily at 6 PM.     b complex vitamins tablet  Take 1 tablet by mouth daily.     BENEFIBER Chew  Chew 1 tablet by mouth daily as needed.     carvedilol 6.25 MG tablet  Commonly known as:  COREG  Take 1 tablet (6.25 mg total) by mouth 2 (two) times daily.     cholecalciferol 1000 units tablet  Commonly known as:  VITAMIN D  Take 2,000 Units by mouth daily.     CO Q 10 PO  Take 400 mg by mouth daily.     Cranberry 500 MG Caps  Take 1,000 mg by mouth daily.     furosemide 80 MG tablet  Commonly known as:  LASIX  Take 1 tablet (80 mg total) by mouth 2 (two) times daily.     glipiZIDE 10 MG tablet  Commonly known as:  GLUCOTROL  Take 20 mg by mouth 2 (two) times daily. Pt takes three tablets in the morning and two tablets at bedtime.     Hawth[place] Berry 550 MG Caps  Take 550 mg by mouth daily.     HYDROcodone-acetaminophen 5-325 MG tablet  Commonly known as:  NORCO/VICODIN  Take 1-2 tablets by mouth every 6 (six) hours as needed for moderate pain.     HYDROcodone-acetaminophen 5-325 MG tablet  Commonly known as:  NORCO/VICODIN  Take 1-2 tablets by mouth every 6 (six) hours as needed for moderate pain.     losartan 25 MG tablet  Commonly known as:  COZAAR  Take 1 tablet (25 mg total) by mouth daily.     metFORMIN 500 MG tablet  Commonly known as:  GLUCOPHAGE  Take 1,000 mg by mouth daily with breakfast.     metFORMIN 500 MG tablet   Commonly known as:  GLUCOPHAGE  Take 500 mg by mouth at bedtime.     multivitamin-lutein Caps capsule  Take 1 capsule by mouth 2 (two) times daily.     OVER THE COUNTER MEDICATION  Take 3 capsules by mouth 2 (two) times daily. Pt takes beet root.     OVER THE COUNTER MEDICATION  Take 1 capsule by mouth daily. Pt takes caprylic acid.     oxybutynin 5 MG tablet  Commonly known as:  DITROPAN  Take 1 tablet (5 mg total)  by mouth every 8 (eight) hours as needed for bladder spasms.     oxyCODONE-acetaminophen 5-325 MG tablet  Commonly known as:  PERCOCET/ROXICET  Take 1 tablet by mouth every 4 (four) hours as needed for moderate pain.     polyethylene glycol packet  Commonly known as:  MIRALAX / GLYCOLAX  Take 17 g by mouth daily as needed for mild constipation.     potassium phosphate (monobasic) 500 MG tablet  Commonly known as:  K-PHOS ORIGINAL  Take 1 tablet (500 mg total) by mouth daily.     spironolactone 25 MG tablet  Commonly known as:  ALDACTONE  Take 1 tablet (25 mg total) by mouth daily.     tamsulosin 0.4 MG Caps capsule  Commonly known as:  FLOMAX  Take 1 capsule (0.4 mg total) by mouth daily.     TURMERIC CURCUMIN PO  Take 1 capsule by mouth daily.     vitamin C 500 MG tablet  Commonly known as:  ASCORBIC ACID  Take 1,000 mg by mouth daily.     vitamin E 400 UNIT capsule  Take 400 Units by mouth daily.        Followup:      Follow-up Information    Follow up with Hollice Espy, MD. Go on 11/09/2015.   Specialty:  Urology   Why:  at 10:30am for cystoscopy/ stent removal   Contact information:   [address] [place] [address] [phone]

## 2015-11-07 IMAGING — US US RENAL
1 series · 14 of 25 positions shown · non-contrast
Comparison: CT of the abdomen pelvis [DATE]

CLINICAL DATA: History of previous nephrolithotomy, post left-sided
nephrostomy catheter placement.

EXAM:
RENAL / URINARY TRACT ULTRASOUND COMPLETE

[Series 1: us renal · 14 of 58 slices shown]
[im 1/58]
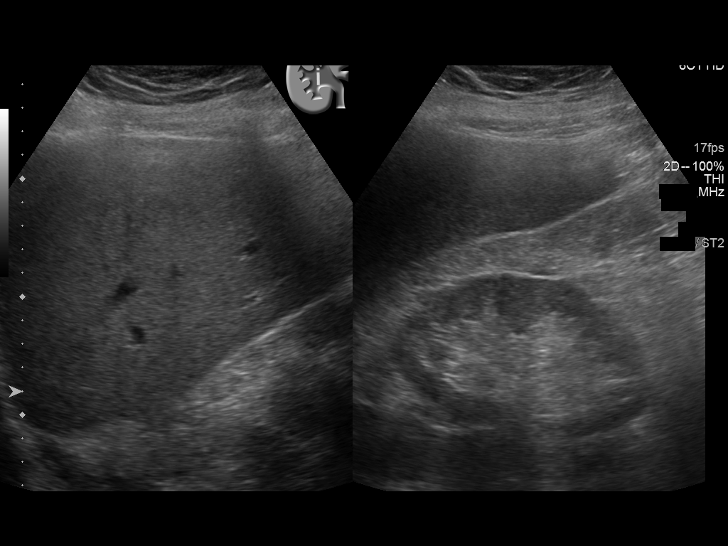
[im 5/58]
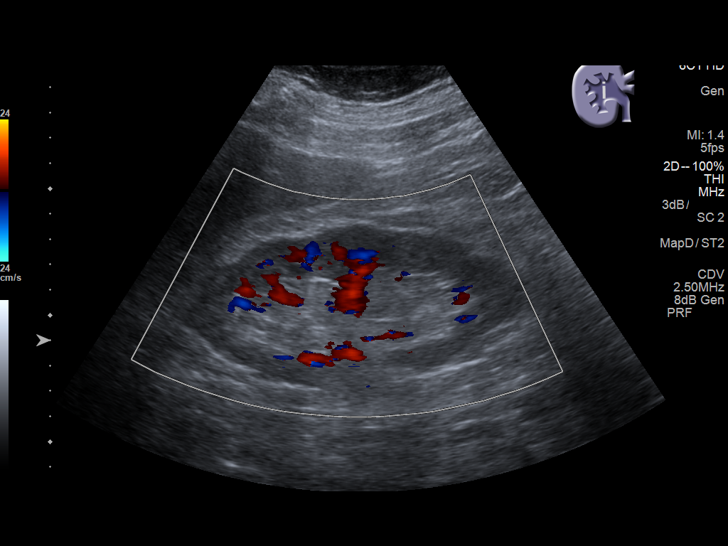
[im 10/58]
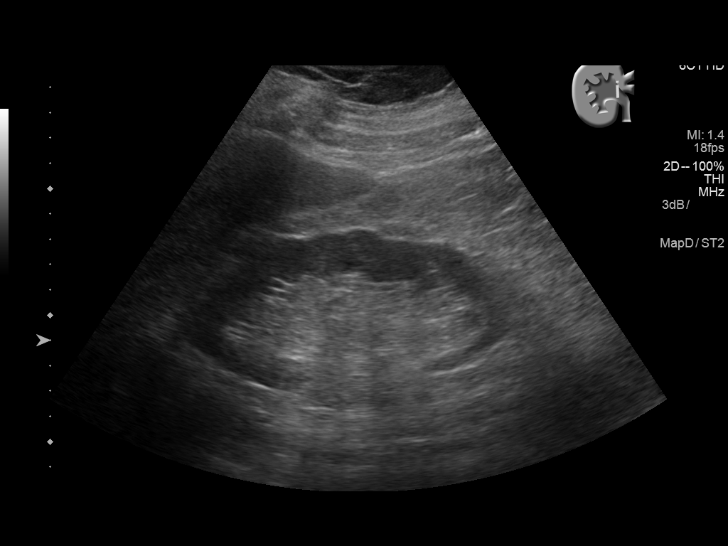
[im 15/58]
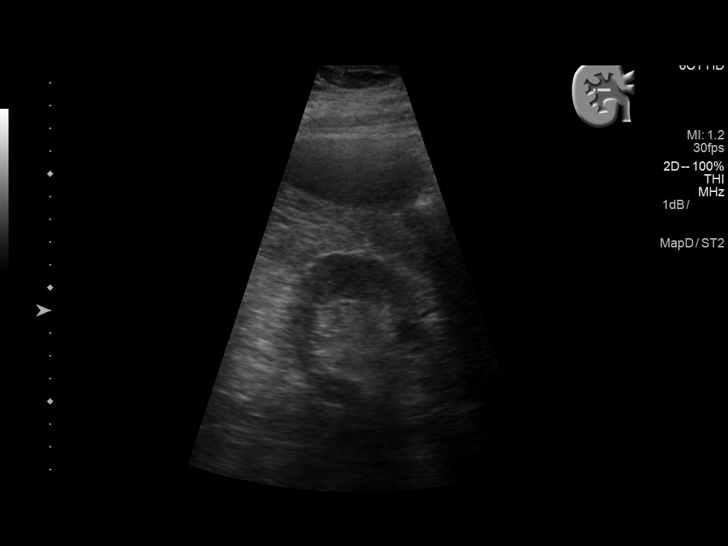
[im 20/58]
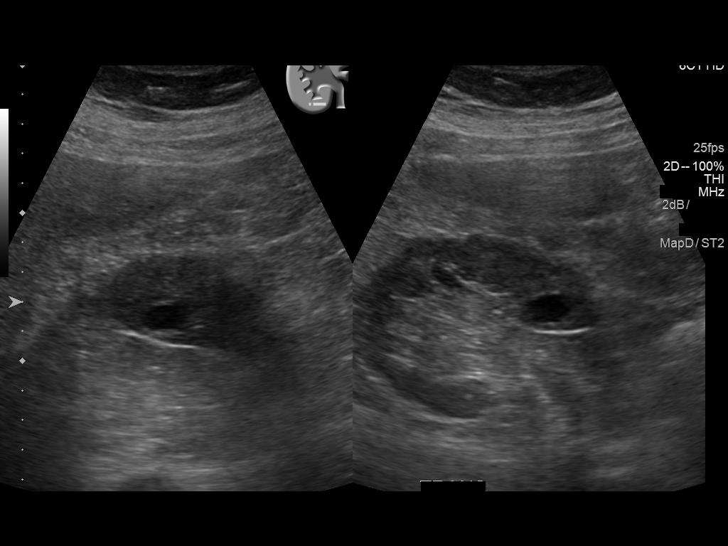
[im 22/58]
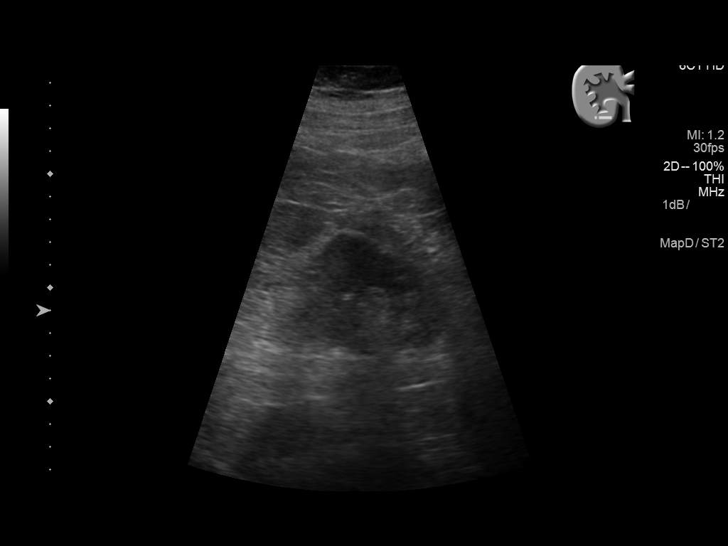
[im 27/58]
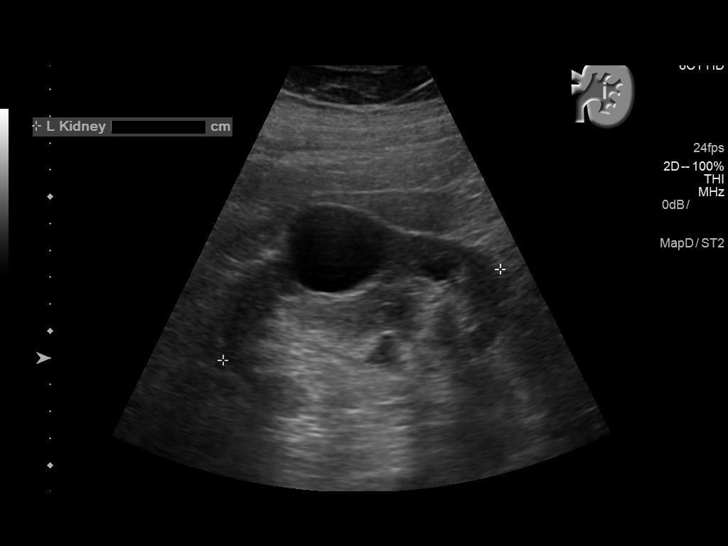
[im 31/58]
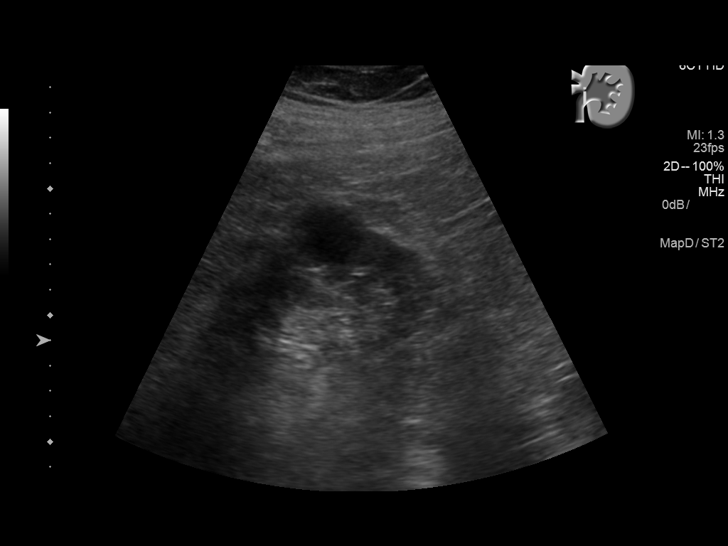
[im 36/58]
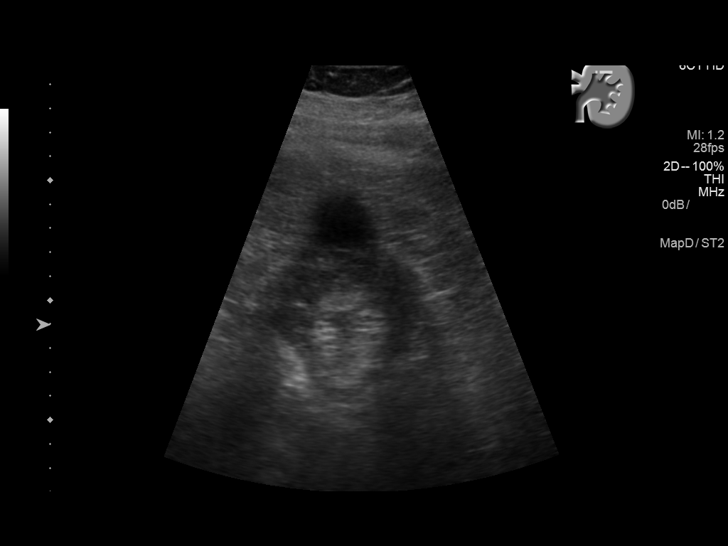
[im 39/58]
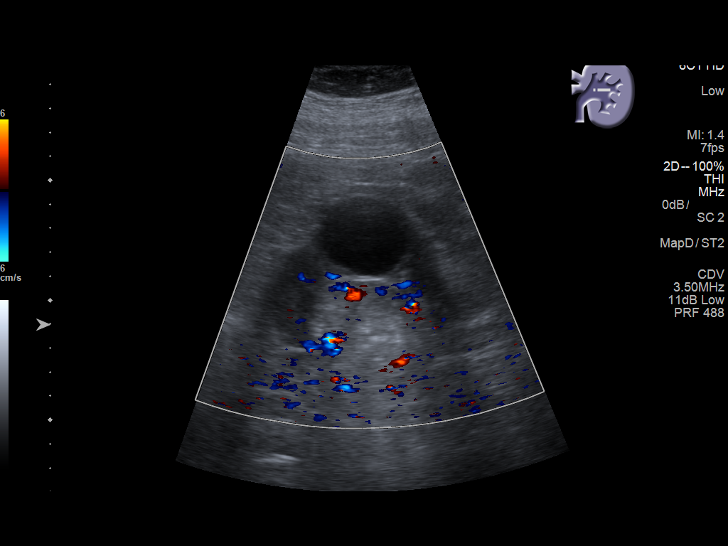
[im 43/58]
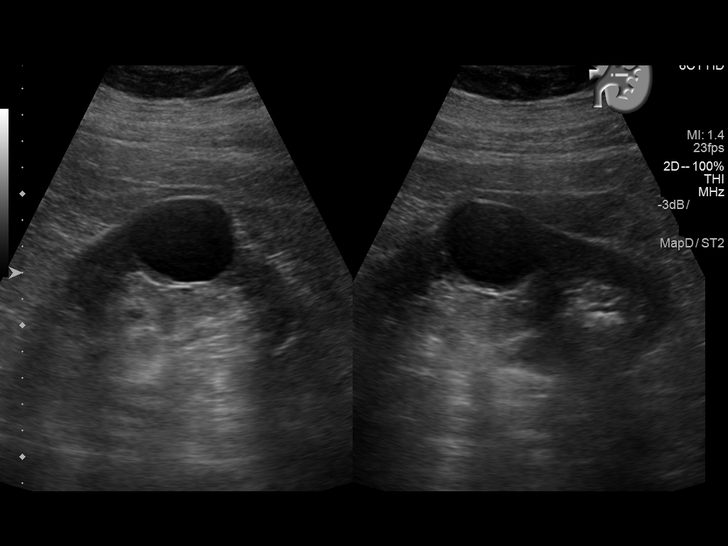
[im 48/58]
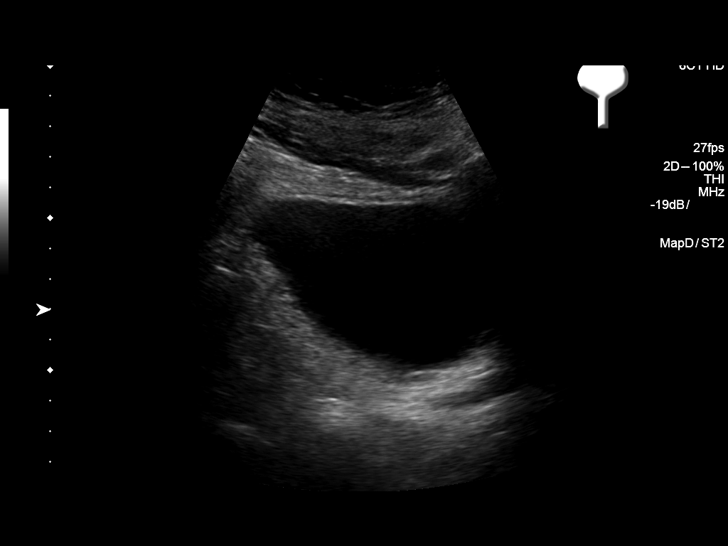
[im 53/58]
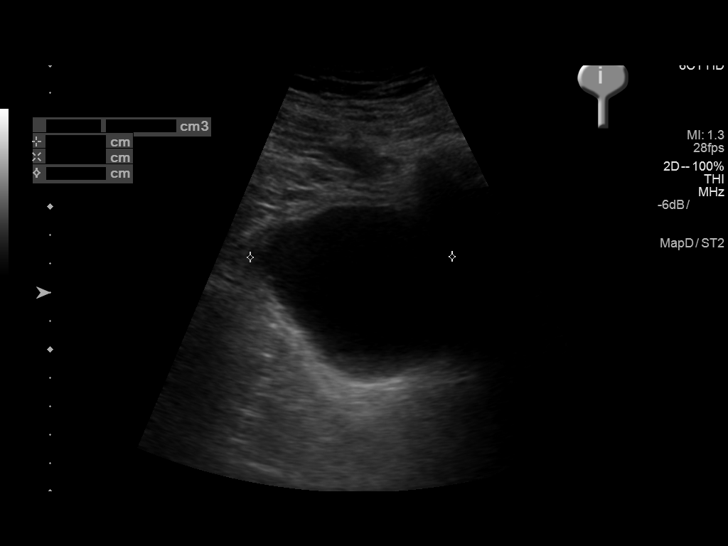
[im 58/58]
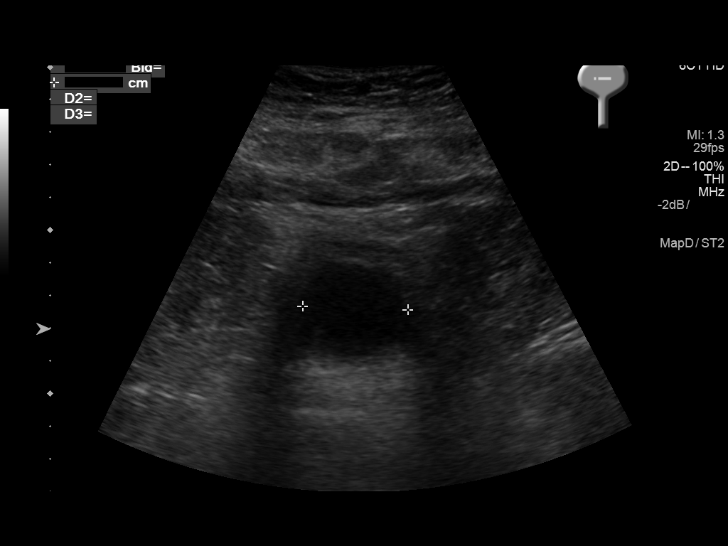

[14 of 25 positions shown; findings below may reference images not displayed]

FINDINGS: Right Kidney:

Length: 13.9 cm. Echogenicity within normal limits. No mass or
hydronephrosis visualized. There is a 1.4 x 0.9 x 1.5 cm midpole
simple appearing cyst.

Left Kidney:

Length: 13.7 cm. Echogenicity within normal limits. No mass or
hydronephrosis visualized. There is a partially exophytic 3.5 x
x 4.1 cm simple appearing cyst in the midpole of the left kidney.
Shadowing calculus measuring 9 mm is seen in the inferior pole of
the left kidney.

Bladder:

Appears normal for degree of bladder distention. Prevoid volume 146
cc, postvoid volume 15 cc.
IMPRESSION: Normal appearance of the right kidney apart from 1.5 cm simple
appearing cyst at its midpole.

No evidence of left hydronephrosis. Residual 9 mm nonobstructing
renal stone within the inferior pole of the left kidney.

3.5 cm partially exophytic benign-appearing cyst off of the midpole
region of the left kidney.

## 2015-11-09 ENCOUNTER — Other Ambulatory Visit: Payer: BLUE CROSS/BLUE SHIELD | Admitting: Urology

## 2015-11-15 ENCOUNTER — Ambulatory Visit (INDEPENDENT_AMBULATORY_CARE_PROVIDER_SITE_OTHER): Payer: BLUE CROSS/BLUE SHIELD | Admitting: Urology

## 2015-11-15 ENCOUNTER — Encounter: Payer: Self-pay | Admitting: Urology

## 2015-11-15 VITALS — BP 117/71 | HR 94 | Ht 71.0 in | Wt 256.0 lb

## 2015-11-15 DIAGNOSIS — N261 Atrophy of kidney (terminal): Secondary | ICD-10-CM | POA: Diagnosis not present

## 2015-11-15 DIAGNOSIS — N201 Calculus of ureter: Secondary | ICD-10-CM | POA: Diagnosis not present

## 2015-11-15 DIAGNOSIS — N1330 Unspecified hydronephrosis: Secondary | ICD-10-CM

## 2015-11-15 MED ORDER — CIPROFLOXACIN HCL 500 MG PO TABS
500.0000 mg | ORAL_TABLET | Freq: Once | ORAL | Status: AC
  Administered 2015-11-15: 500 mg via ORAL

## 2015-11-15 MED ORDER — LIDOCAINE HCL 2 % EX GEL
1.0000 "application " | Freq: Once | CUTANEOUS | Status: AC
  Administered 2015-11-15: 1 via URETHRAL

## 2015-11-15 NOTE — Progress Notes (Signed)
4:03 PM  11/15/2015  [name] 06-28-1950 [id number]  Referring provider: Vidal Schwalbe, MD [address] [place], [place] [address]  Chief Complaint  Patient presents with  . Cysto    HPI: 65 year old male with recent prolonged hospital administration for CHF (EF 15-20%) and A. Fib.  He ultimately was diuresed and appropriate for discharge. During the hospital admission, he developed some left flank pain and underwent a CT scan. This revealed extensive left ureteral stone burden with chronic left kidney obstruction noted. He underwent left percutaneous nephrostomy tube placement in interventional radiology prior to discharge.  CT stone protocol shows numerous left ureteral calcifications including a 2.2 cm, 2.5 cm left proximal stone along with a 1 cm left distal stone. The left kidney is atrophic with hydronephrosis.   He was taken to the operating room on 2 separate occasions for staged ureteroscopic procedure.  On the first stage, and antegrade wire was placed by interventional radiology in order to facilitate access to the ureter.  His most recent procedure was on 10/25/2015 which was uncomplicated. He returns to the office today for cystoscopy, stent removal.   He does have baseline CKD, creatinine at baseline. No fevers or chills. No dysuria.   He does have an extensive personal history of kidney stones. He underwent ESWL 2 and ureteroscopy in the past but has not seen a urologist in over 10 years. He's had intermittent flank pain for numerous years but never sought intervention.   PMH: Past Medical History  Diagnosis Date  . Chronic combined systolic (congestive) and diastolic (congestive) heart failure (HCC)     a. EF 25% by cath in 2013 b. Echo in 07/2015 showing EF of 15-20%, moderate MR, moderate Pulm HTN, severely dilated IVC  . Sleep apnea   . Diabetes mellitus type 2, uncontrolled ([place])     a. A1c 11.0 in 07/2015.  [name] Hypertension   . New onset atrial  fibrillation ([place])     a. diagnosed in 07/2015 b. started on Eliquis  . Coronary artery disease   . Paroxysmal atrial fibrillation (HCC)   . Cardiomyopathy ([place])   . Psoriasis   . Pulmonary hypertension ([place])   . Renal disorder     kidney stone  . CKD (chronic kidney disease) stage 3, GFR 30-59 ml/min   . Kidney stones     Left  . Pollen allergies 09-21-15    pt called and stated that he woke up and had some drainage-pt states he did not see the color of the drainage-pt denies running a fever and this only happened once-pt instructed to call Dr Audree Bane office if he starts running a fever or if the color of drainage becomes yellow/green    Surgical History: Past Surgical History  Procedure Laterality Date  . Kidney surgery    . Coronary angioplasty with stent placement    . Cardiac catheterization N/A 07/25/2015    Procedure: Right/Left Heart Cath and Coronary Angiography;  Surgeon: Wellington Hampshire, MD;  Location: [facility];  Service: Cardiovascular;  Laterality: N/A;  . Cardiac catheterization      [place]  . Cardiac catheterization      [facility], [place]  . Nephrostomy tube placement ([facility] hx) Left   . Ureteroscopy with holmium laser lithotripsy Left 09/07/2015    Procedure: URETEROSCOPY WITH HOLMIUM LASER LITHOTRIPSY;  Surgeon: Hollice Espy, MD;  Location: [facility];  Service: Urology;  Laterality: Left;  . Cystoscopy with stent placement Left 09/07/2015  Procedure: CYSTOSCOPY WITH STENT PLACEMENT;  Surgeon: Hollice Espy, MD;  Location: [facility];  Service: Urology;  Laterality: Left;  . Cystoscopy/ureteroscopy/holmium laser/stent placement Left 10/25/2015    Procedure: CYSTOSCOPY/URETEROSCOPY/HOLMIUM LASER/STENT EXCHANGE;  Surgeon: Hollice Espy, MD;  Location: [facility];  Service: Urology;  Laterality: Left;    Home Medications:    Medication List       This list is accurate as of: 11/15/15  4:03 PM.  Always use your most recent med list.                 ALPRAZolam 0.25 MG tablet  Commonly known as:  XANAX  Take 1 tablet (0.25 mg total) by mouth every 8 (eight) hours as needed for anxiety.     amiodarone 200 MG tablet  Commonly known as:  PACERONE  Take 1 tablet (200 mg total) by mouth daily.     apixaban 5 MG Tabs tablet  Commonly known as:  ELIQUIS  Take 1 tablet (5 mg total) by mouth 2 (two) times daily.     atorvastatin 40 MG tablet  Commonly known as:  LIPITOR  Take 1 tablet (40 mg total) by mouth daily at 6 PM.     b complex vitamins tablet  Take 1 tablet by mouth daily.     BENEFIBER Chew  Chew 1 tablet by mouth daily as needed.     carvedilol 6.25 MG tablet  Commonly known as:  COREG  Take 1 tablet (6.25 mg total) by mouth 2 (two) times daily.     cholecalciferol 1000 units tablet  Commonly known as:  VITAMIN D  Take 2,000 Units by mouth daily.     CO Q 10 PO  Take 400 mg by mouth daily.     Cranberry 500 MG Caps  Take 1,000 mg by mouth daily.     furosemide 80 MG tablet  Commonly known as:  LASIX  Take 1 tablet (80 mg total) by mouth 2 (two) times daily.     glipiZIDE 10 MG tablet  Commonly known as:  GLUCOTROL  Take 20 mg by mouth 2 (two) times daily. Pt takes three tablets in the morning and two tablets at bedtime.     Hawthorne Berry 550 MG Caps  Take 550 mg by mouth daily.     HYDROcodone-acetaminophen 5-325 MG tablet  Commonly known as:  NORCO/VICODIN  Take 1-2 tablets by mouth every 6 (six) hours as needed for moderate pain.     losartan 25 MG tablet  Commonly known as:  COZAAR  Take 1 tablet (25 mg total) by mouth daily.     metFORMIN 500 MG tablet  Commonly known as:  GLUCOPHAGE  Take 500 mg by mouth at bedtime.     metFORMIN 1000 MG tablet  Commonly known as:  GLUCOPHAGE     multivitamin-lutein Caps capsule  Take 1 capsule by mouth 2 (two) times daily.     OVER THE COUNTER MEDICATION  Take 3 capsules by mouth 2 (two) times daily. Pt takes beet root.     OVER THE COUNTER  MEDICATION  Take 1 capsule by mouth daily. Pt takes caprylic acid.     polyethylene glycol packet  Commonly known as:  MIRALAX / GLYCOLAX  Take 17 g by mouth daily as needed for mild constipation.     potassium phosphate (monobasic) 500 MG tablet  Commonly known as:  K-PHOS ORIGINAL  Take 1 tablet (500 mg total) by mouth daily.     spironolactone  25 MG tablet  Commonly known as:  ALDACTONE  Take 1 tablet (25 mg total) by mouth daily.     TURMERIC CURCUMIN PO  Take 1 capsule by mouth daily.     vitamin C 500 MG tablet  Commonly known as:  ASCORBIC ACID  Take 1,000 mg by mouth daily.     vitamin E 400 UNIT capsule  Take 400 Units by mouth daily.        Allergies: No Known Allergies  Family History: Family History  Problem Relation Age of Onset  . Heart failure Father   . Heart attack Brother     Social History:  reports that he has never smoked. He has never used smokeless tobacco. He reports that he drinks alcohol. He reports that he does not use illicit drugs.   Physical Exam: BP 117/71 mmHg  Pulse 94  Ht 5\' 11"  (1.803 m)  Wt 256 lb (116.121 kg)  BMI 35.72 kg/m2  Constitutional:  Alert and oriented, No acute distress. HEENT:  AT, moist mucus membranes.  Trachea midline, no masses. Cardiovascular: No clubbing, cyanosis.  Respiratory: Normal respiratory effort, no increased work of breathing.   GI: Abdomen is soft, nontender, nondistended, no abdominal masses.  Obese. GU: Circumcised phallus with ectopic meatus..   Skin: No rashes, bruises or suspicious lesions. Neurologic: Grossly intact, no focal deficits, moving all 4 extremities. Psychiatric: Normal mood and affect.  Laboratory Data: Lab Results  Component Value Date   WBC 9.1 08/31/2015   HGB 13.3 10/25/2015   HCT 39.0 10/25/2015   MCV 93.3 08/31/2015   PLT 193 08/31/2015    Lab Results  Component Value Date   CREATININE 1.42* 08/31/2015    Lab Results  Component Value Date   HGBA1C 11.0*  07/08/2015    Urinalysis UA reviewed, consistent with indwelling stent  Pertinent Imaging: Study Result     CLINICAL DATA: Left flank pain. History kidney stones.  EXAM: CT ABDOMEN AND PELVIS WITHOUT CONTRAST  TECHNIQUE: Multidetector CT imaging of the abdomen and pelvis was performed following the standard protocol without IV contrast.  COMPARISON: Renal ultrasound and abdominal radiographs 1 day prior.  FINDINGS: Lower chest: Heart mildly enlarged. Coronary artery calcifications are seen. There is a small left pleural effusion.  Liver: Enlarged with diffusely decreased density consistent with steatosis.  Hepatobiliary: Gallbladder physiologically distended, no calcified stone. No biliary dilatation.  Pancreas: No ductal dilatation or inflammation.  Spleen: Normal in size measuring 11.6 cm greatest dimension.  Adrenal glands: No nodule.  Kidneys: There are 2 large stones in the mid proximal left ureter. More proximal stone measures 1.3 x 1.1 x 2.2 cm, with an adjacent stone measuring 2.5 x 1.6 x 1.4 cm. Moderate proximal hydroureteronephrosis with increased density of the urine in the renal collecting system, suggestive of blood. Ureter distal to this is decompressed, however is an additional 0.9 x 1.0 x 0.6 cm stone in the left distal ureter just proximal to the ureterovesicular junction. There is thinning of the left renal parenchyma. Small nonobstructing stone in the lower left kidney. Cyst in the interpolar left kidney measures 3.9 cm. No stones or obstructive uropathy of the right kidney. Right ureter is decompressed.  Stomach/Bowel: Limited bowel assessment secondary to lack of enteric contrast, intra-abdominal ascites, and soft tissue attenuation from body habitus. Stomach physiologically distended with ingested contents. There are no dilated or thickened small bowel loops. Small volume of stool throughout the colon without colonic  wall thickening. The appendix is not confidently  identified.  Vascular/Lymphatic: No retroperitoneal adenopathy. Abdominal aorta is normal in caliber moderate aortic atherosclerosis without aneurysm.  Reproductive: Prostatic calcifications.  Bladder: Decompressed, probable intraluminal blood.  Other: Moderate volume of intra-abdominal and pelvic ascites. Free fluid in the mesenteric, pelvis, both pericolic gutters in both upper quadrants. Mild mesenteric and whole body wall edema. No free air. No loculated intra-abdominal fluid collection.  Musculoskeletal: There are no acute or suspicious osseous abnormalities. Facet arthropathy in the lower lumbar spine.  IMPRESSION: 1. Left hydroureteronephrosis with 2 large stones in the mid proximal ureter measuring 2.2 and 2.5 cm. Ureter distal to this is decompressed, however there is an additional 1 cm stone in the distal left ureter, just proximal to the ureterovesicular junction. Increased density of the urine in the bladder and left renal collecting system consistent with hemorrhage. 2. Moderate volume intra-abdominal ascites. There is hepatic steatosis. 3. Atherosclerosis, including coronary artery calcifications. Cardiomegaly and small left effusion in the included lung bases.   Electronically Signed  By: Jeb Levering M.D.  On: 07/25/2015 19:45   Cystoscopy/ Stent removal procedure  Patient identification was confirmed, informed consent was obtained, and patient was prepped using Betadine solution.  Lidocaine jelly was administered per urethral meatus.    Preoperative abx where received prior to procedure.    Procedure: - Flexible cystoscope introduced, without any difficulty.   - Thorough search of the bladder revealed:    normal urethral meatus  Stent seen emanating from left ureteral orifice, grasped with stent graspers, and removed in entirety.     Moderate debris in bladder limiting visualization.   Distal coil of stent mildly encrusted but remainder of stent free of any stone material.  Post-Procedure: - Patient tolerated the procedure well   Assessment & Plan:    1. Left ureteral stone S/p L staged URS Stent removed today without complication Warning symptoms reviewed  Follow up in 4 weeks with RUS prior- anticipate improvement but not full resolution of hydronephrosis given its chronicity  2. Hydronephrosis, left Obstructing ureteral stones, chronic  3. Left renal atrophy As above  Return in about 4 weeks (around 12/13/2015) for RUS prior.  Hollice Espy, MD  [facility] [address], [place], [place] [address] [phone]

## 2015-11-16 LAB — MICROSCOPIC EXAMINATION: RBC, UA: NONE SEEN /hpf (ref 0–?)

## 2015-11-16 LAB — URINALYSIS, COMPLETE
BILIRUBIN UA: NEGATIVE
GLUCOSE, UA: NEGATIVE
Nitrite, UA: NEGATIVE
PH UA: 6.5 (ref 5.0–7.5)
Specific Gravity, UA: 1.015 (ref 1.005–1.030)
UUROB: 0.2 mg/dL (ref 0.2–1.0)

## 2015-11-22 ENCOUNTER — Ambulatory Visit (INDEPENDENT_AMBULATORY_CARE_PROVIDER_SITE_OTHER): Payer: BLUE CROSS/BLUE SHIELD | Admitting: Cardiology

## 2015-11-22 ENCOUNTER — Encounter: Payer: Self-pay | Admitting: Cardiology

## 2015-11-22 VITALS — BP 94/58 | HR 73 | Ht 71.0 in | Wt 258.5 lb

## 2015-11-22 DIAGNOSIS — I480 Paroxysmal atrial fibrillation: Secondary | ICD-10-CM

## 2015-11-22 DIAGNOSIS — E669 Obesity, unspecified: Secondary | ICD-10-CM

## 2015-11-22 DIAGNOSIS — I5022 Chronic systolic (congestive) heart failure: Secondary | ICD-10-CM

## 2015-11-22 DIAGNOSIS — I2510 Atherosclerotic heart disease of native coronary artery without angina pectoris: Secondary | ICD-10-CM

## 2015-11-22 DIAGNOSIS — E785 Hyperlipidemia, unspecified: Secondary | ICD-10-CM

## 2015-11-22 DIAGNOSIS — I272 Other secondary pulmonary hypertension: Secondary | ICD-10-CM | POA: Diagnosis not present

## 2015-11-22 DIAGNOSIS — I440 Atrioventricular block, first degree: Secondary | ICD-10-CM

## 2015-11-22 DIAGNOSIS — I340 Nonrheumatic mitral (valve) insufficiency: Secondary | ICD-10-CM

## 2015-11-22 DIAGNOSIS — I10 Essential (primary) hypertension: Secondary | ICD-10-CM

## 2015-11-22 NOTE — Progress Notes (Signed)
Cardiology Office Note   Date:  11/22/2015   ID:  [name], DOB 13-Jan-1951, MRN [id number]  Referring Doctor:  Vidal Schwalbe, MD   Cardiologist:   Wende Bushy, MD   Reason for consultation:  Chief Complaint  Patient presents with  . other    2 month f/u no complaints today. Meds reviewed verbally.      History of Present Illness: [name] is a 65 y.o. male who presents for ffup for atrial fibrillation, congestive heart failure  In terms of atrial fibrillation, as far as he is concerned, heart rate is controlled. He does not have any symptoms of palpitations from this. He continues to take the anticoagulant. He has no significant bleeding issues.  In terms of congestive heart failure, he has been aware of a weakened heart for many years now. He was told that he had a heart attack but he does not recall an event in the past where he had significant chest pain. Patient feels that he has been doing fairly well. No chest pain, shortness of breath, edema. Patient is likely class III in terms of functional capacity for CHF.   In terms of blood pressure, patient feels that his blood pressure is improved and not as low as previous visits.  No palpitations. No loss of consciousness. No syncope.    ROS:  Please see the history of present illness. Aside from mentioned under HPI, all other systems are reviewed and negative.     Past Medical History  Diagnosis Date  . Chronic combined systolic (congestive) and diastolic (congestive) heart failure (HCC)     a. EF 25% by cath in 2013 b. Echo in 07/2015 showing EF of 15-20%, moderate MR, moderate Pulm HTN, severely dilated IVC  . Sleep apnea   . Diabetes mellitus type 2, uncontrolled ([place])     a. A1c 11.0 in 07/2015.  [name] Hypertension   . New onset atrial fibrillation ([place])     a. diagnosed in 07/2015 b. started on Eliquis  . Coronary artery disease   . Paroxysmal atrial fibrillation (HCC)   . Cardiomyopathy ([place])   .  Psoriasis   . Pulmonary hypertension ([place])   . Renal disorder     kidney stone  . CKD (chronic kidney disease) stage 3, GFR 30-59 ml/min   . Kidney stones     Left  . Pollen allergies 09-21-15    pt called and stated that he woke up and had some drainage-pt states he did not see the color of the drainage-pt denies running a fever and this only happened once-pt instructed to call Dr Audree Bane office if he starts running a fever or if the color of drainage becomes yellow/green    Past Surgical History  Procedure Laterality Date  . Kidney surgery    . Coronary angioplasty with stent placement    . Cardiac catheterization N/A 07/25/2015    Procedure: Right/Left Heart Cath and Coronary Angiography;  Surgeon: Wellington Hampshire, MD;  Location: [place] [facility];  Service: Cardiovascular;  Laterality: N/A;  . Cardiac catheterization      [place]  . Cardiac catheterization      [facility], [place]  . Nephrostomy tube placement ([facility] hx) Left   . Ureteroscopy with holmium laser lithotripsy Left 09/07/2015    Procedure: URETEROSCOPY WITH HOLMIUM LASER LITHOTRIPSY;  Surgeon: Hollice Espy, MD;  Location: [facility];  Service: Urology;  Laterality: Left;  . Cystoscopy with stent placement Left 09/07/2015  Procedure: CYSTOSCOPY WITH STENT PLACEMENT;  Surgeon: Hollice Espy, MD;  Location: [facility];  Service: Urology;  Laterality: Left;  . Cystoscopy/ureteroscopy/holmium laser/stent placement Left 10/25/2015    Procedure: CYSTOSCOPY/URETEROSCOPY/HOLMIUM LASER/STENT EXCHANGE;  Surgeon: Hollice Espy, MD;  Location: [facility];  Service: Urology;  Laterality: Left;     reports that he has never smoked. He has never used smokeless tobacco. He reports that he drinks alcohol. He reports that he does not use illicit drugs.   family history includes Heart attack in his brother; Heart failure in his father.   Current Outpatient Prescriptions  Medication Sig Dispense Refill  . ALPRAZolam (XANAX) 0.25 MG  tablet Take 1 tablet (0.25 mg total) by mouth every 8 (eight) hours as needed for anxiety. 30 tablet 0  . amiodarone (PACERONE) 200 MG tablet Take 1 tablet (200 mg total) by mouth daily. (Patient taking differently: Take 200 mg by mouth every morning. ) 30 tablet 0  . apixaban (ELIQUIS) 5 MG TABS tablet Take 1 tablet (5 mg total) by mouth 2 (two) times daily. 60 tablet 6  . atorvastatin (LIPITOR) 40 MG tablet Take 1 tablet (40 mg total) by mouth daily at 6 PM. 30 tablet 0  . b complex vitamins tablet Take 1 tablet by mouth daily.    . carvedilol (COREG) 6.25 MG tablet Take 1 tablet (6.25 mg total) by mouth 2 (two) times daily. 60 tablet 6  . cholecalciferol (VITAMIN D) 1000 units tablet Take 2,000 Units by mouth daily.    . Coenzyme Q10 (CO Q 10 PO) Take 400 mg by mouth daily.    . Cranberry 500 MG CAPS Take 1,000 mg by mouth daily.    . furosemide (LASIX) 80 MG tablet Take 1 tablet (80 mg total) by mouth 2 (two) times daily. 60 tablet 0  . glipiZIDE (GLUCOTROL) 10 MG tablet Take 20 mg by mouth 2 (two) times daily. Pt takes three tablets in the morning and two tablets at bedtime.    [name] [place] Berry 550 MG CAPS Take 550 mg by mouth daily.    [name] HYDROcodone-acetaminophen (NORCO/VICODIN) 5-325 MG tablet Take 1-2 tablets by mouth every 6 (six) hours as needed for moderate pain. 10 tablet 0  . losartan (COZAAR) 25 MG tablet Take 1 tablet (25 mg total) by mouth daily. (Patient taking differently: Take 25 mg by mouth at bedtime. ) 30 tablet 6  . metFORMIN (GLUCOPHAGE) 1000 MG tablet   3  . metFORMIN (GLUCOPHAGE) 500 MG tablet Take 500 mg by mouth at bedtime.    . multivitamin-lutein (OCUVITE-LUTEIN) CAPS capsule Take 1 capsule by mouth 2 (two) times daily.    [name] OVER THE COUNTER MEDICATION Take 3 capsules by mouth 2 (two) times daily. Pt takes beet root.    [name] OVER THE COUNTER MEDICATION Take 1 capsule by mouth daily. Pt takes caprylic acid.    . polyethylene glycol (MIRALAX / GLYCOLAX) packet Take 17 g  by mouth daily as needed for mild constipation.    . potassium phosphate, monobasic, (K-PHOS ORIGINAL) 500 MG tablet Take 1 tablet (500 mg total) by mouth daily. 30 tablet 0  . spironolactone (ALDACTONE) 25 MG tablet Take 1 tablet (25 mg total) by mouth daily. 30 tablet 0  . TURMERIC CURCUMIN PO Take 1 capsule by mouth daily.    . vitamin C (ASCORBIC ACID) 500 MG tablet Take 1,000 mg by mouth daily.    . vitamin E 400 UNIT capsule Take 400 Units by mouth daily.    [name]  Wheat Dextrin (BENEFIBER) CHEW Chew 1 tablet by mouth daily as needed.     No current facility-administered medications for this visit.   Facility-Administered Medications Ordered in Other Visits  Medication Dose Route Frequency Provider Last Rate Last Dose  . ampicillin (OMNIPEN) 1 g in sodium chloride 0.9 % 50 mL IVPB  1 g Intravenous Once Hollice Espy, MD      . gentamicin (GARAMYCIN) IVPB 80 mg  80 mg Intravenous Once Hollice Espy, MD        Allergies: Review of patient's allergies indicates no known allergies.    PHYSICAL EXAM: VS:  BP 94/58 mmHg  Pulse 73  Ht 5' 11" (1.803 m)  Wt 258 lb 8 oz (117.255 kg)  BMI 36.07 kg/m2 , Body mass index is 36.07 kg/(m^2). Wt Readings from Last 3 Encounters:  11/22/15 258 lb 8 oz (117.255 kg)  11/15/15 256 lb (116.121 kg)  10/25/15 250 lb (113.399 kg)    GENERAL:  well developed, well nourished,  obese, not in acute distress HEENT: normocephalic, pink conjunctivae, anicteric sclerae, no xanthelasma, normal dentition, oropharynx clear NECK:  no neck vein engorgement, JVP normal, no hepatojugular reflux, carotid upstroke brisk and symmetric, no bruit, no thyromegaly, no lymphadenopathy LUNGS:  good respiratory effort, clear to auscultation bilaterally CV:  PMI not displaced, no thrills, no lifts, S2 within normal limits, no palpable S3 or S4, no murmurs, no rubs, no gallops ABD:  Soft, nontender, distended, normoactive bowel sounds, no abdominal aortic bruit, no hepatomegaly,  no splenomegaly MS: nontender back, no kyphosis, no scoliosis, no joint deformities EXT:  2+ DP/PT pulses, No edema, no varicosities, no cyanosis, no clubbing SKIN: warm, nondiaphoretic, normal turgor, no ulcers NEUROPSYCH: alert, oriented to person, place, and time, sensory/motor grossly intact, normal mood, appropriate affect  Recent Labs: 07/09/2015: B Natriuretic Peptide 1891.0* 07/29/2015: Magnesium 2.0 08/31/2015: BUN 59*; Creatinine, Ser 1.42*; Platelets 193 10/25/2015: Hemoglobin 13.3; Potassium 3.8; Sodium 136   Lipid Panel    Component Value Date/Time   CHOL 153 07/09/2015 0455   TRIG 168* 07/09/2015 0455   HDL 34* 07/09/2015 0455   CHOLHDL 4.5 07/09/2015 0455   VLDL 34 07/09/2015 0455   LDLCALC 85 07/09/2015 0455     Other studies Reviewed:  EKG:   The ekg ordered 08/15/2015 was personally reviewed by me and it reveals sinus rhythm first degree AV block. 85 BPM. LAFB. Cannot rule out anterior infarct.  EKG from 09/22/2015 was personally reviewed by me and reveals sinus rhythm with first-degree AV block. 74 BPM. LAFB. Cannot rule out anterior infarct. No significant change from EKG 08/15/2015. QTC 490 ms.  EKG from 11/22/2015 was personally reviewed by me and it reveals sinus rhythm, 73 BPM. PR interval 208 ms. LAFB.  Additional studies/ records that were reviewed personally reviewed by me today include:  Echocardiogram 07/09/2015:  LV moderately dilated. Mild concentric hypertrophy. EF 15-20%. Possible Akinesis of anterior septal, anterior, anterolateral myocardium. Moderate MR. Left atrium moderately dilated. RV moderately dilated. Systolic function moderately reduced. R a mildly dilated. IVC severely dilated.  Right and left heart cath 07/25/2015, Dr. Fletcher Anon:  Proximal LAD to mid LAD lesion, 60% stenosed. Previously treated with a stent. Distal RCA 60% stenosed. Ostial second diagonal to second diagonal, 70% stenosed. Moderately elevated filling pressures with moderate  pulmonary hypertension. Hemodynamic features suggestive of restrictive physiology Moderate two-vessel CAD. Patent proximal LAD stent with 60% in-stent restenosis. LAD and RCA moderately calcified. PA pressure: 56/34 with a mean of 42, LV pressure: 95/15  with left ventricular end-diastolic pressure of 26. Cardiac output was 4.86 with a cardiac index of 1.9.  Echo 10/13/2015: - Left ventricle: The cavity size was mildly dilated. There was  mild concentric hypertrophy. Systolic function was moderately  reduced. The estimated ejection fraction was in the range of 35%  to 40%. Images were inadequate for LV wall motion assessment.  Doppler parameters are consistent with abnormal left ventricular  relaxation (grade 1 diastolic dysfunction). - Pulmonary arteries: Systolic pressure was within the normal  range.  Impressions:  - EF improved since February with resolution of pulmonary  hypertension.   ASSESSMENT AND PLAN:  congestive heart failure, systolic, chronic CHF, not in acute decompensation, EF 15-20%, improved to 35-40% (10/2015) Ischemic cardiomyopathy Biventricular congestive heart failure Appears euvolemic. Weight has gone up due to dietary indiscretion. Commended on following sodium restriction, lifestyle changes. Blood pressure is better. Continue carvedilol, losartan, Lasix, potassium replacement, Aldactone. Discussed with Dr Caryl Comes  that since his EF improved, there is no indication to proceed with ICD at this point. We both agreed to continue to monitor his ejection fraction. Recommend echocardiogram in 3 months.  patient verbalized understanding and agreed with plan.   Pulmonary hypertension, pulmonary venous, secondary to CHF Likely improve with diuresis. Continue to monitor  Coronary artery disease, moderate two-vessel CAD, no high-grade stenosis on last heart catheterization 07/25/2015.  Consider aspirin 81 mg by mouth daily after urologic procedure. Continue  statin therapy. LDL goal less than 70.  Atrial fibrillation, likely paroxysmal, on antiarrhythmic medication, on anticoagulation, currently in sinus rhythm Continue antiarrhythmic medication for now. Continue carvedilol.  Patient tolerating anticoagulation. Continue eliquis for now.  Recommend to check CMP, thyroid function test, and PFTs since patient has been on amiodarone.   Mitral regurgitation, moderate Likely related to CHF, dilated cardiomyopathy. Should improve with better volume status and blood pressure control. Continue to monitor .  First-degree AV block  on low-dose beta blockade. Continue to monitor.   Hypertension  continue blood pressure log continue current meds .   Hyperlipidemia  continue statin therapy. LDL goal less than 70   Obesity Body mass index is 36.07 kg/(m^2).[name] Recommend aggressive weight loss through diet and increased physical activity as tolerated.  Sleep apnea Follow-up with PCP regarding CPAP    Current medicines are reviewed at length with the patient today.  The patient does not have concerns regarding medicines.  Labs/ tests ordered today include:  Orders Placed This Encounter  Procedures  . Comp Met (CMET)  . TSH  . T4, free  . EKG 12-Lead  . ECHOCARDIOGRAM COMPLETE  . Pulmonary function test    I had a lengthy and detailed discussion with the patient regarding diagnoses, prognosis, diagnostic options, treatment options, and side effects of medications.   I counseled the patient on importance of lifestyle modification including heart healthy diet, regular physical activity.  Disposition:   FU with undersigned In 4-6 months  Signed, Wende Bushy, MD  11/22/2015 11:18 AM    [place]

## 2015-11-22 NOTE — Patient Instructions (Addendum)
Medication Instructions:  Your physician recommends that you continue on your current medications as directed. Please refer to the Current Medication list given to you today.   Labwork: Labs done today were CMET, TSH, and T4. We will call you with results.   Testing/Procedures: Your physician has requested that you have an echocardiogram. Echocardiography is a painless test that uses sound waves to create images of your heart. It provides your doctor with information about the size and shape of your heart and how well your heart's chambers and valves are working. This procedure takes approximately one hour. There are no restrictions for this procedure.  Date & Time: _______________________________________________________  Your physician has recommended that you have a pulmonary function test. Pulmonary Function Tests are a group of tests that measure how well air moves in and out of your lungs.   Date & Time: _______________________________________________________  Follow-Up: Your physician wants you to follow-up in: 6 months with Dr. Yvone Neu. You will receive a reminder letter in the mail two months in advance. If you don't receive a letter, please call our office to schedule the follow-up appointment.      It was a pleasure seeing you today here in the office. Please do not hesitate to give [place] a call back if you have any further questions. [place], BSN       Echocardiogram An echocardiogram, or echocardiography, uses sound waves (ultrasound) to produce an image of your heart. The echocardiogram is simple, painless, obtained within a short period of time, and offers valuable information to your health care provider. The images from an echocardiogram can provide information such as:  Evidence of coronary artery disease (CAD).  Heart size.  Heart muscle function.  Heart valve function.  Aneurysm detection.  Evidence of a past heart attack.  Fluid  buildup around the heart.  Heart muscle thickening.  Assess heart valve function. LET [facility] CARE PROVIDER KNOW ABOUT:  Any allergies you have.  All medicines you are taking, including vitamins, herbs, eye drops, creams, and over-the-counter medicines.  Previous problems you or members of your family have had with the use of anesthetics.  Any blood disorders you have.  Previous surgeries you have had.  Medical conditions you have.  Possibility of pregnancy, if this applies. BEFORE THE PROCEDURE  No special preparation is needed. Eat and drink normally.  PROCEDURE   In order to produce an image of your heart, gel will be applied to your chest and a wand-like tool (transducer) will be moved over your chest. The gel will help transmit the sound waves from the transducer. The sound waves will harmlessly bounce off your heart to allow the heart images to be captured in real-time motion. These images will then be recorded.  You may need an IV to receive a medicine that improves the quality of the pictures. AFTER THE PROCEDURE You may return to your normal schedule including diet, activities, and medicines, unless your health care provider tells you otherwise.   This information is not intended to replace advice given to you by your health care provider. Make sure you discuss any questions you have with your health care provider.   Document Released: 05/18/2000 Document Revised: 06/11/2014 Document Reviewed: 01/26/2013 Elsevier Interactive Patient Education 2016 Elsevier Inc.     Pulmonary Function Tests Pulmonary function tests (PFTs) measure how well your lungs are working. The tests can help to identify the causes of lung problems. They can also help your health care  provider select the best treatment for you. Your health care provider may order pulmonary function for any of the following reasons:  When an illness involving the lungs is suspected.  To follow changes in  your lung function over time if you are known to have a chronic lung disease.  For industrial plant workers to examine the effects of being exposed to chemicals over a long period of time.  To assess lung function prior to surgery or other procedures.  For people who are smokers. Your measured lung function will be compared to the expected lung function of someone with healthy lungs who is similar to you in age, gender, size, and other factors. This is used to determine your "percent predicted" lung function, which is how your health care provider knows if your lung function is normal or abnormal. If you have had prior pulmonary function testing performed, your health care provider will also compare your current results with past tests to see if your lung function is better, worse, or staying the same. This can sometimes be useful to see if treatments are working.  LET [facility] CARE PROVIDER KNOW ABOUT:  Any allergies you have.  All medicines you are taking, including inhaler or nebulizer medicines, vitamins, herbs, eye drops, creams, and over-the-counter medicines.  Any blood disorders you have.  Previous surgeries you have had, especially recent eye surgery, abdominal surgery, or chest surgery. These can make performing pulmonary function tests difficult or unsafe.  Medical conditions you have.  Chest pain or heart problems.  Tuberculosis or respiratory infections, such as pneumonia, a cold, or the flu. If you think you will have difficulty performing any of the breathing maneuvers, ask your health care provider if you should reschedule the test. RISKS AND COMPLICATIONS: Generally, pulmonary function testing is a safe procedure. However, as with any procedure, complications can occur. Possible complications include:  Lightheadedness due to overbreathing (hyperventilation).  An asthmatic attack from deep breathing. BEFORE THE PROCEDURE  Take medicine as directed by your health  care provider. If you take inhaler or nebulizer medicines, ask your health care provider which medicines you should take on the day of your test. Some inhaler medicines may interfere with pulmonary function tests, such as bronchodilator testing, if taken shortly before the test.  Avoid eating a large meal before your test.  Do not smoke before your test.  Wear comfortable clothing which will not interfere with breathing. PROCEDURE  You will be given a soft nose clip to wear during the procedure. This is done so that all of your breaths will go through your mouth instead of your nose.  You will be given a germ-free (sterile) mouthpiece. It will be attached to a spirometer. The spirometer is the machine that measures your breathing.  You will be instructed to perform various breathing maneuvers. The maneuvers will be done by breathing in (inhaling) and breathing out (exhaling). Depending on what measurements are ordered, you may be asked to repeat the maneuvers several times before the test is completed.  It is important to follow the instructions exactly to obtain accurate results. Make sure to blow as hard and as fast as you can when you are instructed to do so.  You may be given a bronchodilator after testing has been performed. A bronchodilator is a medicine which makes the small air passages in your lungs larger. These medicines usually make it easier to breathe. The tests are then repeated several minutes later after the bronchodilator has taken effect.  You will be monitored carefully during the procedure for faintness, dizziness, difficulty breathing, or any other problems. AFTER THE PROCEDURE   You may resume your usual diet, medicines, and activities as directed by your health care provider.  Your health care provider will go over your test results with you and determine what treatments may be helpful.   This information is not intended to replace advice given to you by your health  care provider. Make sure you discuss any questions you have with your health care provider.   Document Released: 01/12/2004 Document Revised: 03/11/2013 Document Reviewed: 12/18/2012 Elsevier Interactive Patient Education [employer].

## 2015-11-23 ENCOUNTER — Ambulatory Visit: Payer: BLUE CROSS/BLUE SHIELD | Attending: Neurology

## 2015-11-23 DIAGNOSIS — G4733 Obstructive sleep apnea (adult) (pediatric): Secondary | ICD-10-CM | POA: Insufficient documentation

## 2015-11-23 LAB — TSH: TSH: 2.05 u[IU]/mL (ref 0.450–4.500)

## 2015-11-23 LAB — COMPREHENSIVE METABOLIC PANEL
ALBUMIN: 4.4 g/dL (ref 3.6–4.8)
ALK PHOS: 61 IU/L (ref 39–117)
ALT: 25 IU/L (ref 0–44)
AST: 17 IU/L (ref 0–40)
Albumin/Globulin Ratio: 1.6 (ref 1.2–2.2)
BUN / CREAT RATIO: 28 — AB (ref 10–24)
BUN: 39 mg/dL — AB (ref 8–27)
Bilirubin Total: 0.4 mg/dL (ref 0.0–1.2)
CALCIUM: 10.2 mg/dL (ref 8.6–10.2)
CHLORIDE: 94 mmol/L — AB (ref 96–106)
CO2: 26 mmol/L (ref 18–29)
CREATININE: 1.37 mg/dL — AB (ref 0.76–1.27)
GFR, EST AFRICAN AMERICAN: 63 mL/min/{1.73_m2} (ref >59)
GFR, EST NON AFRICAN AMERICAN: 55 mL/min/{1.73_m2} — AB (ref >59)
GLUCOSE: 259 mg/dL — AB (ref 65–99)
Globulin, Total: 2.8 g/dL (ref 1.5–4.5)
Potassium: 5.7 mmol/L — ABNORMAL HIGH (ref 3.5–5.2)
Sodium: 139 mmol/L (ref 134–144)
TOTAL PROTEIN: 7.2 g/dL (ref 6.0–8.5)

## 2015-11-23 LAB — T4, FREE: FREE T4: 1.5 ng/dL (ref 0.82–1.77)

## 2015-11-25 ENCOUNTER — Ambulatory Visit (INDEPENDENT_AMBULATORY_CARE_PROVIDER_SITE_OTHER): Payer: BLUE CROSS/BLUE SHIELD | Admitting: *Deleted

## 2015-11-25 DIAGNOSIS — I440 Atrioventricular block, first degree: Secondary | ICD-10-CM

## 2015-11-25 DIAGNOSIS — I480 Paroxysmal atrial fibrillation: Secondary | ICD-10-CM | POA: Diagnosis not present

## 2015-11-25 DIAGNOSIS — I5022 Chronic systolic (congestive) heart failure: Secondary | ICD-10-CM

## 2015-11-25 DIAGNOSIS — I272 Other secondary pulmonary hypertension: Secondary | ICD-10-CM | POA: Diagnosis not present

## 2015-11-25 DIAGNOSIS — I340 Nonrheumatic mitral (valve) insufficiency: Secondary | ICD-10-CM

## 2015-11-25 DIAGNOSIS — E669 Obesity, unspecified: Secondary | ICD-10-CM

## 2015-11-25 DIAGNOSIS — E785 Hyperlipidemia, unspecified: Secondary | ICD-10-CM

## 2015-11-25 DIAGNOSIS — I10 Essential (primary) hypertension: Secondary | ICD-10-CM

## 2015-11-25 DIAGNOSIS — I2510 Atherosclerotic heart disease of native coronary artery without angina pectoris: Secondary | ICD-10-CM | POA: Diagnosis not present

## 2015-11-25 LAB — PULMONARY FUNCTION TEST
DL/VA % PRED: 80 %
DL/VA: 3.74 ml/min/mmHg/L
DLCO UNC % PRED: 62 %
DLCO UNC: 21.01 ml/min/mmHg
FEF 25-75 POST: 3.83 L/s
FEF 25-75 PRE: 3.51 L/s
FEF2575-%Change-Post: 9 %
FEF2575-%Pred-Post: 132 %
FEF2575-%Pred-Pre: 121 %
FEV1-%Change-Post: 2 %
FEV1-%PRED-PRE: 81 %
FEV1-%Pred-Post: 83 %
FEV1-POST: 3.03 L
FEV1-Pre: 2.94 L
FEV1FVC-%Change-Post: 0 %
FEV1FVC-%Pred-Pre: 111 %
FEV6-%CHANGE-POST: 2 %
FEV6-%PRED-POST: 78 %
FEV6-%PRED-PRE: 76 %
FEV6-POST: 3.59 L
FEV6-Pre: 3.5 L
FEV6FVC-%PRED-POST: 105 %
FEV6FVC-%Pred-Pre: 105 %
FVC-%Change-Post: 3 %
FVC-%PRED-PRE: 72 %
FVC-%Pred-Post: 74 %
FVC-POST: 3.61 L
FVC-PRE: 3.5 L
POST FEV6/FVC RATIO: 100 %
PRE FEV1/FVC RATIO: 84 %
Post FEV1/FVC ratio: 84 %
Pre FEV6/FVC Ratio: 100 %
RV % pred: 90 %
RV: 2.15 L
TLC % PRED: 78 %
TLC: 5.67 L

## 2015-11-25 NOTE — Progress Notes (Signed)
PFT performed today. 

## 2015-11-28 ENCOUNTER — Telehealth: Payer: Self-pay | Admitting: *Deleted

## 2015-11-28 NOTE — Telephone Encounter (Signed)
Patient states that his PCP scheduled him for Wednesday for lab work to be done. [name] going to have his labs done at [facility].

## 2015-11-28 NOTE — Telephone Encounter (Signed)
-----   Message from Wende Bushy, MD sent at 11/23/2015 10:09 AM EDT ----- Free T4 and TSH were within normal limits Renal function stable. Potassium slightly up. Recommend to recheck in a week to make sure that is going the right direction. Glucose also significantly up, this was not a fasting study. Please forward to PCP thank you.

## 2015-11-28 NOTE — Telephone Encounter (Signed)
Patient returning call from pam.  Please call again .

## 2015-11-28 NOTE — Telephone Encounter (Signed)
Left voicemail message for patient to call back regarding lab work.

## 2015-12-07 NOTE — Telephone Encounter (Signed)
Labs done at [facility] on 12/01/15. Results scanned into system and will forward to Dr. Yvone Neu for review.

## 2015-12-12 ENCOUNTER — Other Ambulatory Visit: Payer: Self-pay

## 2015-12-13 ENCOUNTER — Ambulatory Visit
Admission: RE | Admit: 2015-12-13 | Discharge: 2015-12-13 | Disposition: A | Discharge status: Home / Self Care | Payer: BLUE CROSS/BLUE SHIELD | Source: Ambulatory Visit | Attending: Urology | Admitting: Urology

## 2015-12-13 DIAGNOSIS — N200 Calculus of kidney: Secondary | ICD-10-CM | POA: Diagnosis not present

## 2015-12-13 DIAGNOSIS — N201 Calculus of ureter: Secondary | ICD-10-CM

## 2015-12-13 DIAGNOSIS — N281 Cyst of kidney, acquired: Secondary | ICD-10-CM | POA: Insufficient documentation

## 2015-12-13 DIAGNOSIS — Z87442 Personal history of urinary calculi: Secondary | ICD-10-CM | POA: Diagnosis not present

## 2015-12-22 ENCOUNTER — Ambulatory Visit: Payer: BLUE CROSS/BLUE SHIELD | Admitting: Family

## 2015-12-23 ENCOUNTER — Encounter: Payer: Self-pay | Admitting: Family

## 2015-12-23 ENCOUNTER — Ambulatory Visit (INDEPENDENT_AMBULATORY_CARE_PROVIDER_SITE_OTHER): Payer: BLUE CROSS/BLUE SHIELD | Admitting: Urology

## 2015-12-23 ENCOUNTER — Ambulatory Visit: Payer: BLUE CROSS/BLUE SHIELD | Attending: Family | Admitting: Family

## 2015-12-23 ENCOUNTER — Encounter: Payer: Self-pay | Admitting: Urology

## 2015-12-23 VITALS — BP 122/59 | HR 75 | Resp 20 | Ht 72.0 in | Wt 262.0 lb

## 2015-12-23 VITALS — BP 110/71 | HR 73 | Ht 72.0 in | Wt 260.0 lb

## 2015-12-23 DIAGNOSIS — Z7901 Long term (current) use of anticoagulants: Secondary | ICD-10-CM | POA: Diagnosis not present

## 2015-12-23 DIAGNOSIS — I5022 Chronic systolic (congestive) heart failure: Secondary | ICD-10-CM

## 2015-12-23 DIAGNOSIS — F32A Depression, unspecified: Secondary | ICD-10-CM

## 2015-12-23 DIAGNOSIS — R0602 Shortness of breath: Secondary | ICD-10-CM | POA: Insufficient documentation

## 2015-12-23 DIAGNOSIS — N183 Chronic kidney disease, stage 3 (moderate): Secondary | ICD-10-CM | POA: Diagnosis not present

## 2015-12-23 DIAGNOSIS — I272 Other secondary pulmonary hypertension: Secondary | ICD-10-CM | POA: Diagnosis not present

## 2015-12-23 DIAGNOSIS — I2510 Atherosclerotic heart disease of native coronary artery without angina pectoris: Secondary | ICD-10-CM | POA: Diagnosis not present

## 2015-12-23 DIAGNOSIS — N200 Calculus of kidney: Secondary | ICD-10-CM

## 2015-12-23 DIAGNOSIS — E1122 Type 2 diabetes mellitus with diabetic chronic kidney disease: Secondary | ICD-10-CM | POA: Diagnosis not present

## 2015-12-23 DIAGNOSIS — F329 Major depressive disorder, single episode, unspecified: Secondary | ICD-10-CM | POA: Insufficient documentation

## 2015-12-23 DIAGNOSIS — G4733 Obstructive sleep apnea (adult) (pediatric): Secondary | ICD-10-CM

## 2015-12-23 DIAGNOSIS — Z79899 Other long term (current) drug therapy: Secondary | ICD-10-CM | POA: Diagnosis not present

## 2015-12-23 DIAGNOSIS — I429 Cardiomyopathy, unspecified: Secondary | ICD-10-CM | POA: Diagnosis not present

## 2015-12-23 DIAGNOSIS — Z7984 Long term (current) use of oral hypoglycemic drugs: Secondary | ICD-10-CM | POA: Insufficient documentation

## 2015-12-23 DIAGNOSIS — I10 Essential (primary) hypertension: Secondary | ICD-10-CM

## 2015-12-23 DIAGNOSIS — I480 Paroxysmal atrial fibrillation: Secondary | ICD-10-CM | POA: Diagnosis not present

## 2015-12-23 DIAGNOSIS — N261 Atrophy of kidney (terminal): Secondary | ICD-10-CM | POA: Diagnosis not present

## 2015-12-23 DIAGNOSIS — I5042 Chronic combined systolic (congestive) and diastolic (congestive) heart failure: Secondary | ICD-10-CM | POA: Insufficient documentation

## 2015-12-23 DIAGNOSIS — I130 Hypertensive heart and chronic kidney disease with heart failure and stage 1 through stage 4 chronic kidney disease, or unspecified chronic kidney disease: Secondary | ICD-10-CM | POA: Diagnosis not present

## 2015-12-23 NOTE — Progress Notes (Signed)
Subjective:    Patient ID: [name], male    DOB: January 16, 1951, 65 y.o.   MRN: [id number]  Congestive Heart Failure Presents for follow-up visit. The disease course has been stable. Associated symptoms include fatigue and shortness of breath. Pertinent negatives include no abdominal pain, chest pain, chest pressure, edema, orthopnea, palpitations or paroxysmal nocturnal dyspnea. The symptoms have been stable. Past treatments include beta blockers, salt and fluid restriction, angiotensin receptor blockers and aldosterone receptor blockers. The treatment provided significant relief. Compliance with prior treatments has been good. His past medical history is significant for arrhythmia, CAD, DM and HTN. He has multiple 1st degree relatives with heart disease.  Hypertension This is a chronic problem. The current episode started more than 1 year ago. The problem is unchanged. The problem is controlled. Associated symptoms include shortness of breath. Pertinent negatives include no chest pain, headaches, palpitations or peripheral edema. There are no associated agents to hypertension. Risk factors for coronary artery disease include diabetes mellitus, family history, male gender, obesity and stress. Past treatments include beta blockers, angiotensin blockers, diuretics and lifestyle changes. The current treatment provides significant improvement. Compliance problems include diet.  Hypertensive end-organ damage includes kidney disease, CAD/MI and heart failure.   Past Medical History  Diagnosis Date  . Chronic combined systolic (congestive) and diastolic (congestive) heart failure (HCC)     a. EF 25% by cath in 2013 b. Echo in 07/2015 showing EF of 15-20%, moderate MR, moderate Pulm HTN, severely dilated IVC  . Sleep apnea   . Diabetes mellitus type 2, uncontrolled ([place])     a. A1c 11.0 in 07/2015.  [name] Hypertension   . New onset atrial fibrillation ([place])     a. diagnosed in 07/2015 b. started on Eliquis   . Coronary artery disease   . Paroxysmal atrial fibrillation (HCC)   . Cardiomyopathy ([place])   . Psoriasis   . Pulmonary hypertension ([place])   . Renal disorder     kidney stone  . CKD (chronic kidney disease) stage 3, GFR 30-59 ml/min   . Kidney stones     Left  . Pollen allergies 09-21-15    pt called and stated that he woke up and had some drainage-pt states he did not see the color of the drainage-pt denies running a fever and this only happened once-pt instructed to call Dr Audree Bane office if he starts running a fever or if the color of drainage becomes yellow/green    Past Surgical History  Procedure Laterality Date  . Kidney surgery    . Coronary angioplasty with stent placement    . Cardiac catheterization N/A 07/25/2015    Procedure: Right/Left Heart Cath and Coronary Angiography;  Surgeon: Wellington Hampshire, MD;  Location: [facility];  Service: Cardiovascular;  Laterality: N/A;  . Cardiac catheterization      [place]  . Cardiac catheterization      [facility], [place]  . Nephrostomy tube placement ([facility] hx) Left   . Ureteroscopy with holmium laser lithotripsy Left 09/07/2015    Procedure: URETEROSCOPY WITH HOLMIUM LASER LITHOTRIPSY;  Surgeon: Hollice Espy, MD;  Location: [facility];  Service: Urology;  Laterality: Left;  . Cystoscopy with stent placement Left 09/07/2015    Procedure: CYSTOSCOPY WITH STENT PLACEMENT;  Surgeon: Hollice Espy, MD;  Location: [facility];  Service: Urology;  Laterality: Left;  . Cystoscopy/ureteroscopy/holmium laser/stent placement Left 10/25/2015    Procedure: CYSTOSCOPY/URETEROSCOPY/HOLMIUM LASER/STENT EXCHANGE;  Surgeon: Hollice Espy, MD;  Location: [facility];  Service:  Urology;  Laterality: Left;    Family History  Problem Relation Age of Onset  . Heart failure Father   . Heart attack Brother     Social History  Substance Use Topics  . Smoking status: Never Smoker   . Smokeless tobacco: Never Used  . Alcohol Use: 0.0 oz/week     0 Standard drinks or equivalent per week     Comment: occasionally    No Known Allergies  Prior to Admission medications   Medication Sig Start Date End Date Taking? Authorizing Provider  ALPRAZolam (XANAX) 0.25 MG tablet Take 1 tablet (0.25 mg total) by mouth every 8 (eight) hours as needed for anxiety. 08/01/15   Lytle Butte, MD  amiodarone (PACERONE) 200 MG tablet Take 1 tablet (200 mg total) by mouth daily. Patient taking differently: Take 200 mg by mouth every morning.  08/01/15   Lytle Butte, MD  apixaban (ELIQUIS) 5 MG TABS tablet Take 1 tablet (5 mg total) by mouth 2 (two) times daily. 09/22/15   Wende Bushy, MD  atorvastatin (LIPITOR) 40 MG tablet Take 1 tablet (40 mg total) by mouth daily at 6 PM. 08/01/15   Lytle Butte, MD  b complex vitamins tablet Take 1 tablet by mouth daily.    Historical Provider, MD  carvedilol (COREG) 6.25 MG tablet Take 1 tablet (6.25 mg total) by mouth 2 (two) times daily. 08/22/15   Deboraha Sprang, MD  cholecalciferol (VITAMIN D) 1000 units tablet Take 2,000 Units by mouth daily.    Historical Provider, MD  Coenzyme Q10 (CO Q 10 PO) Take 400 mg by mouth daily.    Historical Provider, MD  Cranberry 500 MG CAPS Take 1,000 mg by mouth daily.    Historical Provider, MD  furosemide (LASIX) 80 MG tablet Take 1 tablet (80 mg total) by mouth 2 (two) times daily. 08/01/15   Lytle Butte, MD  glipiZIDE (GLUCOTROL) 10 MG tablet Take 20 mg by mouth 2 (two) times daily. Pt takes three tablets in the morning and two tablets at bedtime.    Historical Provider, MD  Cathrine Muster 550 MG CAPS Take 550 mg by mouth daily.    Historical Provider, MD  HYDROcodone-acetaminophen (NORCO/VICODIN) 5-325 MG tablet Take 1-2 tablets by mouth every 6 (six) hours as needed for moderate pain. 10/25/15   Hollice Espy, MD  losartan (COZAAR) 25 MG tablet Take 1 tablet (25 mg total) by mouth daily. Patient taking differently: Take 25 mg by mouth at bedtime.  08/22/15   Deboraha Sprang,  MD  metFORMIN (GLUCOPHAGE) 1000 MG tablet 1,000 mg 2 (two) times daily with a meal.  09/21/15   Historical Provider, MD         multivitamin-lutein (OCUVITE-LUTEIN) CAPS capsule Take 1 capsule by mouth 2 (two) times daily.    Historical Provider, MD  OVER THE COUNTER MEDICATION Take 3 capsules by mouth 2 (two) times daily. Pt takes beet root.    Historical Provider, MD  OVER THE COUNTER MEDICATION Take 1 capsule by mouth daily. Pt takes caprylic acid.    Historical Provider, MD  polyethylene glycol (MIRALAX / GLYCOLAX) packet Take 17 g by mouth daily as needed for mild constipation.    Historical Provider, MD  potassium phosphate, monobasic, (K-PHOS ORIGINAL) 500 MG tablet Take 1 tablet (500 mg total) by mouth daily. 09/29/15   Alisa Graff, FNP  spironolactone (ALDACTONE) 25 MG tablet Take 1 tablet (25 mg total) by mouth daily. 09/29/15  Alisa Graff, FNP  TURMERIC CURCUMIN PO Take 1 capsule by mouth daily.    Historical Provider, MD  vitamin C (ASCORBIC ACID) 500 MG tablet Take 1,000 mg by mouth daily.    Historical Provider, MD  vitamin E 400 UNIT capsule Take 400 Units by mouth daily.    Historical Provider, MD  Wheat Dextrin (BENEFIBER) CHEW Chew 1 tablet by mouth daily as needed.    Historical Provider, MD      Review of Systems  Constitutional: Positive for fatigue. Negative for appetite change.  HENT: Positive for rhinorrhea. Negative for congestion and sore throat.   Eyes: Negative.   Respiratory: Positive for chest tightness and shortness of breath. Negative for cough and wheezing.   Cardiovascular: Negative for chest pain, palpitations and leg swelling.  Gastrointestinal: Negative for abdominal pain and abdominal distention.  Endocrine: Negative.   Genitourinary: Negative.   Musculoskeletal: Positive for back pain. Negative for myalgias.  Skin: Negative.   Allergic/Immunologic: Negative.   Neurological: Negative for dizziness, light-headedness and headaches.  Hematological:  Negative for adenopathy. Does not bruise/bleed easily.  Psychiatric/Behavioral: Positive for sleep disturbance and dysphoric mood. Negative for suicidal ideas. The patient is not nervous/anxious.        Objective:   Physical Exam  Constitutional: [name] oriented to person, place, and time. He appears well-developed and well-nourished.  HENT:  Head: Normocephalic and atraumatic.  Eyes: Conjunctivae are normal. Pupils are equal, round, and reactive to light.  Neck: Normal range of motion. Neck supple.  Cardiovascular: Normal rate and regular rhythm.   Pulmonary/Chest: Effort normal. He has no wheezes. He has no rales.  Abdominal: Soft. He exhibits no distension. There is no tenderness.  Musculoskeletal: He exhibits no edema or tenderness.  Neurological: [name] alert and oriented to person, place, and time.  Skin: Skin is warm and dry.  Psychiatric: He has a normal mood and affect. His behavior is normal. Thought content normal.  Nursing note and vitals reviewed.   BP 122/59 mmHg  Pulse 75  Resp 20  Ht 6' (1.829 m)  Wt 262 lb (118.842 kg)  BMI 35.53 kg/m2  SpO2 98%       Assessment & Plan:  1: Chronic heart failure with reduced ejection fraction- Patient presents with fatigue and shortness of breath upon exertion with moderate exertion (Class II). He says that he was mildly short of breath upon walking into the office today but once he sat down for a few minutes, his breathing quickly improved. He says that he hasn't been weighing daily but knows that he's gained some weight because his roommate had surgery and has only recently recovered enough to resume cooking for them. By our scale, he's gained 11.4 pounds since he was last here on 09/22/15. Encouraged him to resume weighing on a daily basis and to call for an overnight weight gain of >2 pounds or a weekly weight gain of >5 pounds. [name] not adding salt to food but has been eating more salty foods while his roommate was recovering. He  has been sticking to his fluid restriction of <2L daily and is now drinking Psychiatrist. Sees his cardiologist around December 2016. 2: HTN- Blood pressure looks good today. Continue medications at this time.  3: Obstructive sleep apnea- Patient has had his sleep study done but is trying to work with [employer] where he can be reimbursed for buying the equipment on line versus going through sleepmed. 4: Depression- Patient does admit that  he's been going through a time of feeling "blah" but denies any suicidal thoughts. He feels like this will pass as his roommate is feeling better and is now back to work which will help with his finances greatly. Emotional support offered and he was encouraged to speak with his PCP if these feelings persisted and patient verbalizes that he will.  Medication list that patient brought with him was reviewed.  Return here in 3 months or sooner for any questions/problems before then.

## 2015-12-23 NOTE — Patient Instructions (Addendum)
Begin weighing daily and call for an overnight weight gain of > 2 pounds or a weekly weight gain of >5 pounds. 

## 2015-12-23 NOTE — Progress Notes (Signed)
1:50 PM  12/24/2015  [name] [birth year] [id number]  Referring provider: Vidal Schwalbe, MD [address] [place], [place] [address]  Chief Complaint  Patient presents with  . Follow-up    Renal US results    HPI: 65 year old male with recent prolonged hospital administration for CHF (EF 15-20%) and A. Fib.  He ultimately was diuresed and appropriate for discharge. During the hospital admission, he developed some left flank pain and underwent a CT scan. This revealed extensive left ureteral stone burden with chronic left kidney obstruction noted. He underwent left percutaneous nephrostomy tube placement in interventional radiology prior to discharge.  CT stone protocol shows numerous left ureteral calcifications including a 2.2 cm, 2.5 cm left proximal stone along with a 1 cm left distal stone. The left kidney is atrophic with hydronephrosis.   He was taken to the operating room on 2 separate occasions for staged ureteroscopic procedure.  On the first stage, and antegrade wire was placed by interventional radiology in order to facilitate access to the ureter.  His most recent procedure was on 10/25/2015 which was uncomplicated followed by stent removal.  He does have baseline CKD, creatinine appears to possibly be improved from baseline. No fevers or chills. No dysuria. He does have mild tenderness int he left flank area over the previous nephrostomy tube site.   He does have an extensive personal history of kidney stones. He underwent ESWL 2 and ureteroscopy in the past but has not seen a urologist in over 10 years. He's had intermittent flank pain for numerous years but never sought intervention.  Follow up RUS shows no residual hydronephrosis with possible residual 9 mm left lower pole calcification vs. Debris.  Stone analysis shows 90% calcium oxalate monohydrate, 5% calcium phosphate, and 5% of an unknown urate.  He has significantly decreased his volume of water intake  that also has to be careful of fluid overload given his history of cardiomyopathy/CHF.   PMH: Past Medical History  Diagnosis Date  . Chronic combined systolic (congestive) and diastolic (congestive) heart failure (HCC)     a. EF 25% by cath in 2013 b. Echo in 07/2015 showing EF of 15-20%, moderate MR, moderate Pulm HTN, severely dilated IVC  . Sleep apnea   . Diabetes mellitus type 2, uncontrolled ([place])     a. A1c 11.0 in 07/2015.  [name] Hypertension   . New onset atrial fibrillation ([place])     a. diagnosed in 07/2015 b. started on Eliquis  . Coronary artery disease   . Paroxysmal atrial fibrillation (HCC)   . Cardiomyopathy ([place])   . Psoriasis   . Pulmonary hypertension ([place])   . Renal disorder     kidney stone  . CKD (chronic kidney disease) stage 3, GFR 30-59 ml/min   . Kidney stones     Left  . Pollen allergies 09-21-15    pt called and stated that he woke up and had some drainage-pt states he did not see the color of the drainage-pt denies running a fever and this only happened once-pt instructed to call Dr Audree Bane office if he starts running a fever or if the color of drainage becomes yellow/green    Surgical History: Past Surgical History  Procedure Laterality Date  . Kidney surgery    . Coronary angioplasty with stent placement    . Cardiac catheterization N/A 07/25/2015    Procedure: Right/Left Heart Cath and Coronary Angiography;  Surgeon: Wellington Hampshire, MD;  Location: [facility];  Service: Cardiovascular;  Laterality: N/A;  . Cardiac catheterization      [place]  . Cardiac catheterization      [facility], [place]  . Nephrostomy tube placement ([facility] hx) Left   . Ureteroscopy with holmium laser lithotripsy Left 09/07/2015    Procedure: URETEROSCOPY WITH HOLMIUM LASER LITHOTRIPSY;  Surgeon: Hollice Espy, MD;  Location: [facility];  Service: Urology;  Laterality: Left;  . Cystoscopy with stent placement Left 09/07/2015    Procedure: CYSTOSCOPY WITH STENT  PLACEMENT;  Surgeon: Hollice Espy, MD;  Location: [facility];  Service: Urology;  Laterality: Left;  . Cystoscopy/ureteroscopy/holmium laser/stent placement Left 10/25/2015    Procedure: CYSTOSCOPY/URETEROSCOPY/HOLMIUM LASER/STENT EXCHANGE;  Surgeon: Hollice Espy, MD;  Location: [facility];  Service: Urology;  Laterality: Left;    Home Medications:    Medication List       This list is accurate as of: 12/23/15 11:59 PM.  Always use your most recent med list.               ALPRAZolam 0.25 MG tablet  Commonly known as:  XANAX  Take 1 tablet (0.25 mg total) by mouth every 8 (eight) hours as needed for anxiety.     amiodarone 200 MG tablet  Commonly known as:  PACERONE  Take 1 tablet (200 mg total) by mouth daily.     apixaban 5 MG Tabs tablet  Commonly known as:  ELIQUIS  Take 1 tablet (5 mg total) by mouth 2 (two) times daily.     atorvastatin 40 MG tablet  Commonly known as:  LIPITOR  Take 1 tablet (40 mg total) by mouth daily at 6 PM.     b complex vitamins tablet  Take 1 tablet by mouth daily.     BENEFIBER Chew  Chew 1 tablet by mouth daily as needed. Reported on 12/23/2015     carvedilol 6.25 MG tablet  Commonly known as:  COREG  Take 1 tablet (6.25 mg total) by mouth 2 (two) times daily.     cholecalciferol 1000 units tablet  Commonly known as:  VITAMIN D  Take 2,000 Units by mouth daily.     CO Q 10 PO  Take 400 mg by mouth daily.     Cranberry 500 MG Caps  Take 1,000 mg by mouth daily.     furosemide 80 MG tablet  Commonly known as:  LASIX  Take 1 tablet (80 mg total) by mouth 2 (two) times daily.     glipiZIDE 10 MG tablet  Commonly known as:  GLUCOTROL  Take 20 mg by mouth 2 (two) times daily. Pt takes three tablets in the morning and two tablets at bedtime.     Hawthorne Berry 550 MG Caps  Take 550 mg by mouth daily.     HYDROcodone-acetaminophen 5-325 MG tablet  Commonly known as:  NORCO/VICODIN  Take 1-2 tablets by mouth every 6 (six) hours as  needed for moderate pain.     losartan 25 MG tablet  Commonly known as:  COZAAR  Take 1 tablet (25 mg total) by mouth daily.     metFORMIN 1000 MG tablet  Commonly known as:  GLUCOPHAGE  1,000 mg 2 (two) times daily with a meal.     multivitamin-lutein Caps capsule  Take 1 capsule by mouth 2 (two) times daily.     OVER THE COUNTER MEDICATION  Take 3 capsules by mouth 2 (two) times daily. Reported on 12/23/2015     OVER THE COUNTER MEDICATION  Take 1 capsule by mouth daily. Pt takes caprylic acid.     polyethylene glycol packet  Commonly known as:  MIRALAX / GLYCOLAX  Take 17 g by mouth daily as needed for mild constipation.     potassium phosphate (monobasic) 500 MG tablet  Commonly known as:  K-PHOS ORIGINAL  Take 1 tablet (500 mg total) by mouth daily.     spironolactone 25 MG tablet  Commonly known as:  ALDACTONE  Take 1 tablet (25 mg total) by mouth daily.     TURMERIC CURCUMIN PO  Take 1 capsule by mouth daily.     vitamin C 500 MG tablet  Commonly known as:  ASCORBIC ACID  Take 1,000 mg by mouth daily.     vitamin E 400 UNIT capsule  Take 400 Units by mouth daily.        Allergies: No Known Allergies  Family History: Family History  Problem Relation Age of Onset  . Heart failure Father   . Heart attack Brother     Social History:  reports that he has never smoked. He has never used smokeless tobacco. He reports that he drinks alcohol. He reports that he does not use illicit drugs.   Physical Exam: BP 110/71 mmHg  Pulse 73  Ht 6' (1.829 m)  Wt 260 lb (117.935 kg)  BMI 35.25 kg/m2  Constitutional:  Alert and oriented, No acute distress. HEENT: [place] AT, moist mucus membranes.  Trachea midline, no masses. Cardiovascular: No clubbing, cyanosis.  Respiratory: Normal respiratory effort, no increased work of breathing.   GI: Abdomen is soft, nontender, nondistended, no abdominal masses.  Obese. GU: Mild left superficial flank tenderness over previous  nephrostomy tube site.  Skin: No rashes, bruises or suspicious lesions. Neurologic: Grossly intact, no focal deficits, moving all 4 extremities. Psychiatric: Normal mood and affect.  Laboratory Data: Lab Results  Component Value Date   WBC 9.1 08/31/2015   HGB 13.3 10/25/2015   HCT 39.0 10/25/2015   MCV 93.3 08/31/2015   PLT 193 08/31/2015    Lab Results  Component Value Date   CREATININE 1.37* 11/22/2015    Lab Results  Component Value Date   HGBA1C 11.0* 07/08/2015     Pertinent Imaging: Study Result     CLINICAL DATA: History of previous nephrolithotomy, post left-sided nephrostomy catheter placement.  EXAM: RENAL / URINARY TRACT ULTRASOUND COMPLETE  COMPARISON: CT of the abdomen pelvis 07/25/2015  FINDINGS: Right Kidney:  Length: 13.9 cm. Echogenicity within normal limits. No mass or hydronephrosis visualized. There is a 1.4 x 0.9 x 1.5 cm midpole simple appearing cyst.  Left Kidney:  Length: 13.7 cm. Echogenicity within normal limits. No mass or hydronephrosis visualized. There is a partially exophytic 3.5 x 4.1 x 4.1 cm simple appearing cyst in the midpole of the left kidney. Shadowing calculus measuring 9 mm is seen in the inferior pole of the left kidney.  Bladder:  Appears normal for degree of bladder distention. Prevoid volume 146 cc, postvoid volume 15 cc.  IMPRESSION: Normal appearance of the right kidney apart from 1.5 cm simple appearing cyst at its midpole.  No evidence of left hydronephrosis. Residual 9 mm nonobstructing renal stone within the inferior pole of the left kidney.  3.5 cm partially exophytic benign-appearing cyst off of the midpole region of the left kidney.   Electronically Signed  By: Fidela Salisbury M.D.  On: 12/13/2015 14:45    RUS personally reviewed today  Assessment & Plan:    1. Left ureteral  stone S/p L staged URS, Successfully treated large burden of left chronic ureteral stone  burden No residual hydro on RUS, possible left nonobstructive fragment on RUS but may represent debris/ clot We discussed general stone prevention techniques including drinking plenty water with goal of producing 2.5 L urine daily, increased citric acid intake, avoidance of high oxalate containing foods, and decreased salt intake.  Information about dietary recommendations given today.  Patient was offered [id number] urine metabolic workup but declined at this patient will unlikely be able to tolerate any medical intervention given additional comorbidity/polypharmacy.  Recommend close follow-up with K[facility].  2. Hydronephrosis, left Resolved on most recent RUS after treatment of obstructing ureteral stones  3. 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 CKD Stable vs. Mildly improved  Return in about 5 months (around 05/24/2016) for with [facility].  Hollice Espy, MD  [facility] [address], [place], [place] [address] [phone]

## 2015-12-24 ENCOUNTER — Encounter: Payer: Self-pay | Admitting: Urology

## 2016-02-21 ENCOUNTER — Ambulatory Visit (INDEPENDENT_AMBULATORY_CARE_PROVIDER_SITE_OTHER): Payer: BLUE CROSS/BLUE SHIELD

## 2016-02-21 ENCOUNTER — Other Ambulatory Visit: Payer: Self-pay

## 2016-02-21 DIAGNOSIS — I480 Paroxysmal atrial fibrillation: Secondary | ICD-10-CM | POA: Diagnosis not present

## 2016-03-21 ENCOUNTER — Ambulatory Visit: Payer: BLUE CROSS/BLUE SHIELD | Attending: Family | Admitting: Family

## 2016-03-21 ENCOUNTER — Encounter: Payer: Self-pay | Admitting: Family

## 2016-03-21 VITALS — BP 125/69 | HR 77 | Resp 18 | Ht 72.0 in | Wt 280.0 lb

## 2016-03-21 DIAGNOSIS — I4891 Unspecified atrial fibrillation: Secondary | ICD-10-CM | POA: Insufficient documentation

## 2016-03-21 DIAGNOSIS — Z8249 Family history of ischemic heart disease and other diseases of the circulatory system: Secondary | ICD-10-CM | POA: Insufficient documentation

## 2016-03-21 DIAGNOSIS — I130 Hypertensive heart and chronic kidney disease with heart failure and stage 1 through stage 4 chronic kidney disease, or unspecified chronic kidney disease: Secondary | ICD-10-CM | POA: Insufficient documentation

## 2016-03-21 DIAGNOSIS — I5022 Chronic systolic (congestive) heart failure: Secondary | ICD-10-CM

## 2016-03-21 DIAGNOSIS — L409 Psoriasis, unspecified: Secondary | ICD-10-CM | POA: Diagnosis not present

## 2016-03-21 DIAGNOSIS — IMO0002 Reserved for concepts with insufficient information to code with codable children: Secondary | ICD-10-CM

## 2016-03-21 DIAGNOSIS — I509 Heart failure, unspecified: Secondary | ICD-10-CM | POA: Insufficient documentation

## 2016-03-21 DIAGNOSIS — I272 Pulmonary hypertension, unspecified: Secondary | ICD-10-CM | POA: Diagnosis not present

## 2016-03-21 DIAGNOSIS — Z7901 Long term (current) use of anticoagulants: Secondary | ICD-10-CM | POA: Diagnosis not present

## 2016-03-21 DIAGNOSIS — Z79899 Other long term (current) drug therapy: Secondary | ICD-10-CM | POA: Insufficient documentation

## 2016-03-21 DIAGNOSIS — E1165 Type 2 diabetes mellitus with hyperglycemia: Secondary | ICD-10-CM

## 2016-03-21 DIAGNOSIS — Z7984 Long term (current) use of oral hypoglycemic drugs: Secondary | ICD-10-CM | POA: Diagnosis not present

## 2016-03-21 DIAGNOSIS — E1122 Type 2 diabetes mellitus with diabetic chronic kidney disease: Secondary | ICD-10-CM | POA: Insufficient documentation

## 2016-03-21 DIAGNOSIS — G4733 Obstructive sleep apnea (adult) (pediatric): Secondary | ICD-10-CM | POA: Diagnosis not present

## 2016-03-21 DIAGNOSIS — I2510 Atherosclerotic heart disease of native coronary artery without angina pectoris: Secondary | ICD-10-CM | POA: Diagnosis not present

## 2016-03-21 DIAGNOSIS — N189 Chronic kidney disease, unspecified: Secondary | ICD-10-CM | POA: Insufficient documentation

## 2016-03-21 DIAGNOSIS — I480 Paroxysmal atrial fibrillation: Secondary | ICD-10-CM

## 2016-03-21 DIAGNOSIS — Z87442 Personal history of urinary calculi: Secondary | ICD-10-CM | POA: Diagnosis not present

## 2016-03-21 DIAGNOSIS — E1159 Type 2 diabetes mellitus with other circulatory complications: Secondary | ICD-10-CM

## 2016-03-21 NOTE — Progress Notes (Signed)
Patient ID: [name], male    DOB: July 26, 1950, 65 y.o.   MRN: [id number]  HPI  Mr Nephew is a 65 y/o male with a history of obstructive sleep apnea, pulmonary HTN, psoriasis, atrial fibrillation, kidney stones, DM, CAD, CKD and chronic heart failure.  Last echo done on 02/21/16 which showed an EF of 35-40% which is an improvement from a previous echo done on 07/09/15 which showed an EF of 15-20%. There had been discussion of possible ICD but that is put on hold with improvement of his EF.    Last admission was on 10/25/15 for a left ureteroscopy for a large amount of ureteral stones. Was a little hypotension and had medications adjusted and responded well. Was discharged the next day. Prior admission was on 09/07/15 for lithotripsy.  He presents today for a follow-up visit with fatigue upon exertion but denies any shortness of breath or swelling in his legs/abdomen. He continues to weigh himself and reports a continued weight gain because of his eating habits. His roommate still doesn't feel well so they are eating more carbs/sweets as well as eating out. Not adding salt to his food. Last saw cardiologist 11/22/15. Glucose levels have also been running high which he feels like it related to his carb intake.   Past Medical History:  Diagnosis Date  . Cardiomyopathy ([place])   . Chronic combined systolic (congestive) and diastolic (congestive) heart failure    a. EF 25% by cath in 2013 b. Echo in 07/2015 showing EF of 15-20%, moderate MR, moderate Pulm HTN, severely dilated IVC  . CKD (chronic kidney disease) stage 3, GFR 30-59 ml/min   . Coronary artery disease   . Diabetes mellitus type 2, uncontrolled ([place])    a. A1c 11.0 in 07/2015.  [name] Hypertension   . Kidney stones    Left  . New onset atrial fibrillation ([place])    a. diagnosed in 07/2015 b. started on Eliquis  . Paroxysmal atrial fibrillation ([facility])   . Pollen allergies 09-21-15   pt called and stated that he woke up and had some drainage-pt  states he did not see the color of the drainage-pt denies running a fever and this only happened once-pt instructed to call Dr Audree Bane office if he starts running a fever or if the color of drainage becomes yellow/green  . Psoriasis   . Pulmonary hypertension   . Renal disorder    kidney stone  . Sleep apnea     Past Surgical History:  Procedure Laterality Date  . CARDIAC CATHETERIZATION N/A 07/25/2015   Procedure: Right/Left Heart Cath and Coronary Angiography;  Surgeon: Wellington Hampshire, MD;  Location: [place] [facility];  Service: Cardiovascular;  Laterality: N/A;  . CARDIAC CATHETERIZATION     [place]  . CARDIAC CATHETERIZATION     [facility], [place]    . CYSTOSCOPY WITH STENT PLACEMENT Left 09/07/2015   Procedure: CYSTOSCOPY WITH STENT PLACEMENT;  Surgeon: Hollice Espy, MD;  Location: [facility];  Service: Urology;  Laterality: Left;  . CYSTOSCOPY/URETEROSCOPY/HOLMIUM LASER/STENT PLACEMENT Left 10/25/2015   Procedure: CYSTOSCOPY/URETEROSCOPY/HOLMIUM LASER/STENT EXCHANGE;  Surgeon: Hollice Espy, MD;  Location: [facility];  Service: Urology;  Laterality: Left;  . KIDNEY SURGERY    . NEPHROSTOMY TUBE PLACEMENT ([place] HX) Left   . URETEROSCOPY WITH HOLMIUM LASER LITHOTRIPSY Left 09/07/2015   Procedure: URETEROSCOPY WITH HOLMIUM LASER LITHOTRIPSY;  Surgeon: Hollice Espy, MD;  Location: [facility];  Service: Urology;  Laterality: Left;  Family History  Problem Relation Age of Onset  . Heart failure Father   . Heart attack Brother     Social History  Substance Use Topics  . Smoking status: Never Smoker  . Smokeless tobacco: Never Used  . Alcohol use 0.0 oz/week     Comment: occasionally    No Known Allergies  Prior to Admission medications   Medication Sig Start Date End Date Taking? Authorizing Provider  amiodarone (PACERONE) 200 MG tablet Take 1 tablet (200 mg total) by mouth daily. Patient taking differently: Take 200 mg  by mouth every morning.  08/01/15  Yes Lytle Butte, MD  apixaban (ELIQUIS) 5 MG TABS tablet Take 1 tablet (5 mg total) by mouth 2 (two) times daily. 09/22/15  Yes Wende Bushy, MD  atorvastatin (LIPITOR) 40 MG tablet Take 1 tablet (40 mg total) by mouth daily at 6 PM. 08/01/15  Yes Lytle Butte, MD  b complex vitamins tablet Take 1 tablet by mouth daily.   Yes Historical Provider, MD  carvedilol (COREG) 6.25 MG tablet Take 1 tablet (6.25 mg total) by mouth 2 (two) times daily. 08/22/15  Yes Deboraha Sprang, MD  cholecalciferol (VITAMIN D) 1000 units tablet Take 2,000 Units by mouth daily.   Yes Historical Provider, MD  Coenzyme Q10 (CO Q 10 PO) Take 400 mg by mouth daily.   Yes Historical Provider, MD  Cranberry 500 MG CAPS Take 1,000 mg by mouth daily.   Yes Historical Provider, MD  furosemide (LASIX) 80 MG tablet Take 1 tablet (80 mg total) by mouth 2 (two) times daily. 08/01/15  Yes Lytle Butte, MD  glipiZIDE (GLUCOTROL) 10 MG tablet Take 20 mg by mouth 2 (two) times daily. Pt takes three tablets in the morning and two tablets at bedtime.   Yes Historical Provider, MD  Cathrine Muster 550 MG CAPS Take 550 mg by mouth daily.   Yes Historical Provider, MD  losartan (COZAAR) 25 MG tablet Take 1 tablet (25 mg total) by mouth daily. Patient taking differently: Take 25 mg by mouth at bedtime.  08/22/15  Yes Deboraha Sprang, MD  metFORMIN (GLUCOPHAGE) 1000 MG tablet 1,000 mg 2 (two) times daily with a meal.  09/21/15  Yes Historical Provider, MD  multivitamin-lutein (OCUVITE-LUTEIN) CAPS capsule Take 1 capsule by mouth 2 (two) times daily.   Yes Historical Provider, MD  OVER THE COUNTER MEDICATION Take 3 capsules by mouth 2 (two) times daily. Reported on 12/23/2015   Yes Historical Provider, MD  OVER THE COUNTER MEDICATION Take 1 capsule by mouth daily. Pt takes caprylic acid.   Yes Historical Provider, MD  polyethylene glycol (MIRALAX / GLYCOLAX) packet Take 17 g by mouth daily as needed for mild  constipation.   Yes Historical Provider, MD  potassium phosphate, monobasic, (K-PHOS ORIGINAL) 500 MG tablet Take 1 tablet (500 mg total) by mouth daily. 09/29/15  Yes Alisa Graff, FNP  spironolactone (ALDACTONE) 25 MG tablet Take 1 tablet (25 mg total) by mouth daily. 09/29/15  Yes Alisa Graff, FNP  TURMERIC CURCUMIN PO Take 1 capsule by mouth daily.   Yes Historical Provider, MD  vitamin C (ASCORBIC ACID) 500 MG tablet Take 1,000 mg by mouth daily.   Yes Historical Provider, MD  vitamin E 400 UNIT capsule Take 400 Units by mouth daily.   Yes Historical Provider, MD  Wheat Dextrin (BENEFIBER) CHEW Chew 1 tablet by mouth daily as needed. Reported on 12/23/2015   Yes Historical Provider, MD  Review of Systems  Constitutional: Positive for fatigue. Negative for appetite change.  HENT: Positive for rhinorrhea. Negative for congestion and sore throat.   Eyes: Negative.   Respiratory: Negative for cough, chest tightness and shortness of breath.   Cardiovascular: Negative for chest pain, palpitations and leg swelling.  Gastrointestinal: Negative for abdominal distention and abdominal pain.  Endocrine: Negative.   Genitourinary: Negative.   Musculoskeletal: Positive for arthralgias (ankle). Negative for back pain.  Skin: Negative.   Allergic/Immunologic: Negative.   Neurological: Positive for light-headedness (on occasion). Negative for dizziness.  Hematological: Negative for adenopathy. Does not bruise/bleed easily.  Psychiatric/Behavioral: Negative for dysphoric mood and sleep disturbance (wearing CPAP nightly). The patient is not nervous/anxious.    Vitals:   03/21/16 1106  BP: 125/69  Pulse: 77  Resp: 18  SpO2: 99%  Weight: 280 lb (127 kg)  Height: 6' (1.829 m)      Physical Exam  Constitutional: [name] oriented to person, place, and time. He appears well-developed and well-nourished.  HENT:  Head: Normocephalic and atraumatic.  Eyes: Conjunctivae are normal. Pupils are  equal, round, and reactive to light.  Neck: Normal range of motion. Neck supple.  Cardiovascular: Normal rate and regular rhythm.   Pulmonary/Chest: Effort normal and breath sounds normal.  Abdominal: Soft. He exhibits no distension. There is no tenderness.  Musculoskeletal: He exhibits no edema or tenderness.  Neurological: [name] alert and oriented to person, place, and time.  Skin: Skin is warm and dry.  Psychiatric: He has a normal mood and affect. His behavior is normal. Thought content normal.  Nursing note and vitals reviewed.    Assessment & Plan:  1: Chronic heart failure with reduced EF- - NYHA class II - euvolemic - EF improving - weight up 20.4 pounds since he was last here on 12/23/15. Encouraged resumption of portion control/better dietary choices and increased activity - sees cardiologist again on 05/08/16 - discussed changing his losartan to entresto but he is not interested at this time  2: Atrial fibrillation- - currently rate controlled at this time - remains on amiodarone, apixaban and carvedilol  3: Obstructive sleep apnea- - wearing CPAP nightly and has been for about 1 month - reports sleeping well  4: Diabetes- - glucose this morning was 303 - admits to eating more carbs than usual  - remains on glipizide and metformin - sees PCP tomorrow  Patient does not want to make a follow-up appointment at this time. Advised him that he could call back at anytime for questions or to schedule another appointment.

## 2016-03-21 NOTE — Patient Instructions (Signed)
Continue weighing daily and call for an overnight weight gain of > 2 pounds or a weekly weight gain of >5 pounds. 

## 2016-04-02 ENCOUNTER — Other Ambulatory Visit: Payer: Self-pay | Admitting: Internal Medicine

## 2016-04-02 NOTE — Telephone Encounter (Signed)
Dr. Yvone Neu pt

## 2016-04-24 ENCOUNTER — Other Ambulatory Visit: Payer: Self-pay | Admitting: Cardiology

## 2016-05-08 ENCOUNTER — Ambulatory Visit (INDEPENDENT_AMBULATORY_CARE_PROVIDER_SITE_OTHER): Payer: BLUE CROSS/BLUE SHIELD | Admitting: Cardiology

## 2016-05-08 ENCOUNTER — Encounter: Payer: Self-pay | Admitting: Cardiology

## 2016-05-08 VITALS — BP 100/70 | HR 96 | Ht 72.0 in | Wt 288.8 lb

## 2016-05-08 DIAGNOSIS — I10 Essential (primary) hypertension: Secondary | ICD-10-CM

## 2016-05-08 DIAGNOSIS — I480 Paroxysmal atrial fibrillation: Secondary | ICD-10-CM

## 2016-05-08 DIAGNOSIS — I2510 Atherosclerotic heart disease of native coronary artery without angina pectoris: Secondary | ICD-10-CM | POA: Diagnosis not present

## 2016-05-08 DIAGNOSIS — I272 Pulmonary hypertension, unspecified: Secondary | ICD-10-CM

## 2016-05-08 DIAGNOSIS — I440 Atrioventricular block, first degree: Secondary | ICD-10-CM

## 2016-05-08 DIAGNOSIS — I5022 Chronic systolic (congestive) heart failure: Secondary | ICD-10-CM | POA: Diagnosis not present

## 2016-05-08 NOTE — Progress Notes (Signed)
Cardiology Office Note   Date:  05/08/2016   ID:  [name], DOB [birth year], MRN [id number]  Referring Doctor:  Vidal Schwalbe, MD   Cardiologist:   Wende Bushy, MD   Reason for consultation:  Chief Complaint  Patient presents with  . other    84mo f/u echo/pulmonary function test. Pt states he is feeling ok. Reviewed meds with pt verbally.      History of Present Illness: [name] is a 66 y.o. male who presents for ffup for atrial fibrillation, congestive heart failure  In terms of atrial fibrillation, as far as he is concerned, heart rate is controlled. He does not have any symptoms of palpitations from this. He continues to take the anticoagulant. He has no significant bleeding issues.  In terms of congestive heart failure, he has been aware of a weakened heart for many years now. He was told that he had a heart attack but he does not recall an event in the past where he had significant chest pain. Patient feels that he has been doing fairly well.Since last visit, No chest pain, shortness of breath, edema. Weight gain is more from caloric intake and not fluid overload.   In terms of blood pressure, blood pressure is within normal limits  No palpitations. No loss of consciousness. No syncope.    ROS:  Please see the history of present illness. Aside from mentioned under HPI, all other systems are reviewed and negative.     Past Medical History:  Diagnosis Date  . Cardiomyopathy ([place])   . Chronic combined systolic (congestive) and diastolic (congestive) heart failure    a. EF 25% by cath in 2013 b. Echo in 07/2015 showing EF of 15-20%, moderate MR, moderate Pulm HTN, severely dilated IVC  . CKD (chronic kidney disease) stage 3, GFR 30-59 ml/min   . Coronary artery disease   . Diabetes mellitus type 2, uncontrolled ([place])    a. A1c 11.0 in 07/2015.  [name] Hypertension   . Kidney stones    Left  . New onset atrial fibrillation ([place])    a. diagnosed in 07/2015 b.  started on Eliquis  . Paroxysmal atrial fibrillation ([facility])   . Pollen allergies 09-21-15   pt called and stated that he woke up and had some drainage-pt states he did not see the color of the drainage-pt denies running a fever and this only happened once-pt instructed to call Dr Audree Bane office if he starts running a fever or if the color of drainage becomes yellow/green  . Psoriasis   . Pulmonary hypertension   . Renal disorder    kidney stone  . Sleep apnea     Past Surgical History:  Procedure Laterality Date  . CARDIAC CATHETERIZATION N/A 07/25/2015   Procedure: Right/Left Heart Cath and Coronary Angiography;  Surgeon: Wellington Hampshire, MD;  Location: [place] [facility];  Service: Cardiovascular;  Laterality: N/A;  . CARDIAC [place]HE[place]IZATION     [place]  . CARDIAC CATHETERIZATION     [facility], [place]    . CYSTOSCOPY WITH STENT PLACEMENT Left 09/07/2015   Procedure: CYSTOSCOPY WITH STENT PLACEMENT;  Surgeon: Hollice Espy, MD;  Location: [facility];  Service: Urology;  Laterality: Left;  . CYSTOSCOPY/URETEROSCOPY/HOLMIUM LASER/STENT PLACEMENT Left 10/25/2015   Procedure: CYSTOSCOPY/URETEROSCOPY/HOLMIUM LASER/STENT EXCHANGE;  Surgeon: Hollice Espy, MD;  Location: [facility];  Service: Urology;  Laterality: Left;  . KIDNEY SURGERY    . NEPHROSTOMY TUBE PLACEMENT ([place] HX)  Left   . URETEROSCOPY WITH HOLMIUM LASER LITHOTRIPSY Left 09/07/2015   Procedure: URETEROSCOPY WITH HOLMIUM LASER LITHOTRIPSY;  Surgeon: Hollice Espy, MD;  Location: [facility];  Service: Urology;  Laterality: Left;     reports that he has never smoked. He has never used smokeless tobacco. He reports that he drinks alcohol. He reports that he does not use drugs.   family history includes Heart attack in his brother; Heart failure in his father.   Current Outpatient Prescriptions  Medication Sig Dispense Refill  . amiodarone (PACERONE) 200 MG tablet Take 1 tablet (200  mg total) by mouth daily. (Patient taking differently: Take 200 mg by mouth every morning. ) 30 tablet 0  . atorvastatin (LIPITOR) 40 MG tablet Take 1 tablet (40 mg total) by mouth daily at 6 PM. 30 tablet 0  . b complex vitamins tablet Take 1 tablet by mouth daily.    . baclofen (LIORESAL) 10 MG tablet Take 10 mg by mouth daily.    . carvedilol (COREG) 6.25 MG tablet TAKE 1 TABLET BY MOUTH TWICE DAILY 60 tablet 3  . cholecalciferol (VITAMIN D) 1000 units tablet Take 2,000 Units by mouth daily.    . Coenzyme Q10 (CO Q 10 PO) Take 400 mg by mouth daily.    . Cranberry 500 MG CAPS Take 1,000 mg by mouth daily.    [name] ELIQUIS 5 MG TABS tablet TAKE 1 TABLET BY MOUTH TWICE DAILY 60 tablet 3  . furosemide (LASIX) 80 MG tablet Take 1 tablet (80 mg total) by mouth 2 (two) times daily. 60 tablet 0  . glipiZIDE (GLUCOTROL) 10 MG tablet Take 10 mg by mouth 2 (two) times daily. Pt takes three tablets in the morning and two tablets at bedtime.     [name] Hawthorne Berry 550 MG CAPS Take 550 mg by mouth daily.    [name] liraglutide (VICTOZA) 18 MG/3ML SOPN Inject 1.8 mg into the skin daily.    [name] losartan (COZAAR) 25 MG tablet TAKE 1 TABLET BY MOUTH ONCE DAILY. 30 tablet 3  . metFORMIN (GLUCOPHAGE) 1000 MG tablet 1,000 mg 2 (two) times daily with a meal.   3  . multivitamin-lutein (OCUVITE-LUTEIN) CAPS capsule Take 1 capsule by mouth 2 (two) times daily.    [name] OVER THE COUNTER MEDICATION Take 3 capsules by mouth 2 (two) times daily. Reported on 12/23/2015    . OVER THE COUNTER MEDICATION Take 1 capsule by mouth daily. Pt takes caprylic acid.    . polyethylene glycol (MIRALAX / GLYCOLAX) packet Take 17 g by mouth daily as needed for mild constipation.    . potassium phosphate, monobasic, (K-PHOS ORIGINAL) 500 MG tablet Take 1 tablet (500 mg total) by mouth daily. 30 tablet 0  . spironolactone (ALDACTONE) 25 MG tablet Take 1 tablet (25 mg total) by mouth daily. 30 tablet 0  . TURMERIC CURCUMIN PO Take 1 capsule by mouth  daily.    . vitamin C (ASCORBIC ACID) 500 MG tablet Take 1,000 mg by mouth daily.    . vitamin E 400 UNIT capsule Take 400 Units by mouth daily.    . Wheat Dextrin (BENEFIBER) CHEW Chew 1 tablet by mouth daily as needed. Reported on 12/23/2015     No current facility-administered medications for this visit.     Allergies: Patient has no known allergies.    PHYSICAL EXAM: VS:  BP 100/70 (BP Location: Left Arm, Patient Position: Sitting, Cuff Size: Normal)   Pulse 96   Ht 6' (1.829  m)   Wt 288 lb 12 oz (131 kg)   BMI 39.16 kg/m  , Body mass index is 39.16 kg/m. Wt Readings from Last 3 Encounters:  05/08/16 288 lb 12 oz (131 kg)  03/21/16 280 lb (127 kg)  12/23/15 260 lb (117.9 kg)    GENERAL:  well developed, well nourished,  obese, not in acute distress HEENT: normocephalic, pink conjunctivae, anicteric sclerae, no xanthelasma, normal dentition, oropharynx clear NECK:  no neck vein engorgement, JVP normal, no hepatojugular reflux, carotid upstroke brisk and symmetric, no bruit, no thyromegaly, no lymphadenopathy LUNGS:  good respiratory effort, clear to auscultation bilaterally CV:  PMI not displaced, no thrills, no lifts, S2 within normal limits, no palpable S3 or S4, no murmurs, no rubs, no gallops ABD:  Soft, nontender, distended, normoactive bowel sounds, no abdominal aortic bruit, no hepatomegaly, no splenomegaly MS: nontender back, no kyphosis, no scoliosis, no joint deformities EXT:  2+ DP/PT pulses, No edema, no varicosities, no cyanosis, no clubbing SKIN: warm, nondiaphoretic, normal turgor, no ulcers NEUROPSYCH: alert, oriented to person, place, and time, sensory/motor grossly intact, normal mood, appropriate affect  Recent Labs: 07/09/2015: B Natriuretic Peptide 1,891.0 07/29/2015: Magnesium 2.0 08/31/2015: Platelets 193 10/25/2015: Hemoglobin 13.3 11/22/2015: ALT 25; BUN 39; Creatinine, Ser 1.37; Potassium 5.7; Sodium 139; TSH 2.050   Lipid Panel    Component Value  Date/Time   CHOL 153 07/09/2015 0455   TRIG 168 (H) 07/09/2015 0455   HDL 34 (L) 07/09/2015 0455   CHOLHDL 4.5 07/09/2015 0455   VLDL 34 07/09/2015 0455   LDLCALC 85 07/09/2015 0455     Other studies Reviewed:  EKG:   The ekg ordered 08/15/2015 was personally reviewed by me and it reveals sinus rhythm first degree AV block. 85 BPM. LAFB. Cannot rule out anterior infarct.  EKG from 09/22/2015 was personally reviewed by me and reveals sinus rhythm with first-degree AV block. 74 BPM. LAFB. Cannot rule out anterior infarct. No significant change from EKG 08/15/2015. QTC 490 ms.  EKG from 11/22/2015 was personally reviewed by me and it reveals sinus rhythm, 73 BPM. PR interval 208 ms. LAFB.  Additional studies/ records that were reviewed personally reviewed by me today include:  Echocardiogram 07/09/2015:  LV moderately dilated. Mild concentric hypertrophy. EF 15-20%. Possible Akinesis of anterior septal, anterior, anterolateral myocardium. Moderate MR. Left atrium moderately dilated. RV moderately dilated. Systolic function moderately reduced. R a mildly dilated. IVC severely dilated.  Right and left heart cath 07/25/2015, Dr. Fletcher Anon:  Proximal LAD to mid LAD lesion, 60% stenosed. Previously treated with a stent. Distal RCA 60% stenosed. Ostial second diagonal to second diagonal, 70% stenosed. Moderately elevated filling pressures with moderate pulmonary hypertension. Hemodynamic features suggestive of restrictive physiology Moderate two-vessel CAD. Patent proximal LAD stent with 60% in-stent restenosis. LAD and RCA moderately calcified. PA pressure: 56/34 with a mean of 42, LV pressure: 95/15 with left ventricular end-diastolic pressure of 26. Cardiac output was 4.86 with a cardiac index of 1.9.  Echo 10/13/2015: - Left ventricle: The cavity size was mildly dilated. There was  mild concentric hypertrophy. Systolic function was moderately  reduced. The estimated ejection fraction was in  the range of 35%  to 40%. Images were inadequate for LV wall motion assessment.  Doppler parameters are consistent with abnormal left ventricular  relaxation (grade 1 diastolic dysfunction). - Pulmonary arteries: Systolic pressure was within the normal  range.  Impressions:  - EF improved since February with resolution of pulmonary  hypertension.   Echo 02/21/2016:  Left ventricle: The cavity size was mildly dilated. Wall   thickness was normal. Systolic function was moderately reduced.   The estimated ejection fraction was in the range of 35% to 40%.  ASSESSMENT AND PLAN:  congestive heart failure, systolic, chronic CHF, not in acute decompensation, EF 15-20%, improved to 35-40% (10/2015, 02/21/2016) Ischemic cardiomyopathy Biventricular congestive heart failure Appears euvolemic. Weight has gone up due to dietary indiscretion. Continue carvedilol, losartan, Lasix, potassium replacement, Aldactone. Discussed with Dr Caryl Comes  that since his EF improved, there is no indication to proceed with ICD at this point. We both agreed to continue to monitor his ejection fraction.  Recommend echocardiogram in June 2018 patient verbalized understanding and agreed with plan.   Pulmonary hypertension, pulmonary venous, secondary to CHF Likely improve with diuresis. Continue to monitor  Coronary artery disease, moderate two-vessel CAD, no high-grade stenosis on last heart catheterization 07/25/2015.  Consider aspirin 81 mg by mouth daily. Continue statin therapy. LDL goal less than 70.  Atrial fibrillation, likely paroxysmal, on antiarrhythmic medication, on anticoagulation, currently in sinus rhythm Continue antiarrhythmic medication for now. Continue carvedilol.  Patient tolerating anticoagulation. Continue eliquis for now.  Recommend to check LFTs or CMP, thyroid function test every 6 months, and PFTs annually since patient has been on amiodarone. He goes through PCP for his labs. PFT  from June 2017 showed mild restriction, possibly from obesity, mild decreased DLCO that corrects partially for alveolar volume. Await official report.  Mitral regurgitation, moderate Likely related to CHF, dilated cardiomyopathy. Should improve with better volume status and blood pressure control. Continue to monitor .  First-degree AV block  on low-dose beta blockade. Continue to monitor.   Hypertension  continue blood pressure log continue current meds .   Hyperlipidemia  continue statin therapy. LDL goal less than 70   Obesity Body mass index is 39.16 kg/m.[name] Recommend aggressive weight loss through diet and increased physical activity as tolerated.  Sleep apnea Follow-up with PCP regarding CPAP    Current medicines are reviewed at length with the patient today.  The patient does not have concerns regarding medicines.  Labs/ tests ordered today include:  Orders Placed This Encounter  Procedures  . EKG 12-Lead  . ECHOCARDIOGRAM COMPLETE  . Pulmonary function test    I had a lengthy and detailed discussion with the patient regarding diagnoses, prognosis, diagnostic options, treatment options, and side effects of medications.   I counseled the patient on importance of lifestyle modification including heart healthy diet, regular physical activity.  Disposition:   FU with undersigned In 6 months  Signed, Wende Bushy, MD  05/08/2016 2:15 PM    [facility]

## 2016-05-08 NOTE — Patient Instructions (Signed)
Testing/Procedures: Your physician has requested that you have an echocardiogram. Echocardiography is a painless test that uses sound waves to create images of your heart. It provides your doctor with information about the size and shape of your heart and how well your heart's chambers and valves are working. This procedure takes approximately one hour. There are no restrictions for this procedure.   Your physician has recommended that you have a pulmonary function test. Pulmonary Function Tests are a group of tests that measure how well air moves in and out of your lungs. She would like you to have this in 6 months.  She also recommends that you have your liver and thyroid function labs checked every 6 months as well.     Follow-Up: Your physician wants you to follow-up in: 6 months with Dr. Yvone Neu.  You will receive a reminder letter in the mail two months in advance. If you don't receive a letter, please call our office to schedule the follow-up appointment.  It was a pleasure seeing you today here in the office. Please do not hesitate to give [place] a call back if you have any further questions. [place], BSN

## 2016-05-25 ENCOUNTER — Ambulatory Visit
Admission: RE | Admit: 2016-05-25 | Discharge: 2016-05-25 | Disposition: A | Discharge status: Home / Self Care | Payer: BLUE CROSS/BLUE SHIELD | Source: Ambulatory Visit | Attending: Urology | Admitting: Urology

## 2016-05-25 ENCOUNTER — Ambulatory Visit: Payer: BLUE CROSS/BLUE SHIELD | Admitting: Urology

## 2016-05-25 ENCOUNTER — Ambulatory Visit (INDEPENDENT_AMBULATORY_CARE_PROVIDER_SITE_OTHER): Payer: BLUE CROSS/BLUE SHIELD | Admitting: Urology

## 2016-05-25 ENCOUNTER — Encounter: Payer: Self-pay | Admitting: Urology

## 2016-05-25 ENCOUNTER — Other Ambulatory Visit
Admission: RE | Admit: 2016-05-25 | Discharge: 2016-05-25 | Disposition: A | Discharge status: Home / Self Care | Payer: BLUE CROSS/BLUE SHIELD | Source: Ambulatory Visit | Attending: Urology | Admitting: Urology

## 2016-05-25 VITALS — BP 145/83 | HR 88 | Ht 72.0 in | Wt 284.0 lb

## 2016-05-25 DIAGNOSIS — N200 Calculus of kidney: Secondary | ICD-10-CM | POA: Insufficient documentation

## 2016-05-25 DIAGNOSIS — N183 Chronic kidney disease, stage 3 unspecified: Secondary | ICD-10-CM

## 2016-05-25 LAB — URINALYSIS, DIPSTICK ONLY
Bilirubin Urine: NEGATIVE
GLUCOSE, UA: NEGATIVE mg/dL
KETONES UR: NEGATIVE mg/dL
NITRITE: NEGATIVE
PH: 6 (ref 5.0–8.0)
Protein, ur: NEGATIVE mg/dL
Specific Gravity, Urine: 1.01 (ref 1.005–1.030)

## 2016-05-25 IMAGING — CR DG ABDOMEN 1V
2 series · 2 of 2 positions shown · non-contrast
Comparison: CT [DATE] .

CLINICAL DATA: Kidney stones.

EXAM:
ABDOMEN - 1 VIEW

[abdomen kub (1 of 2)]
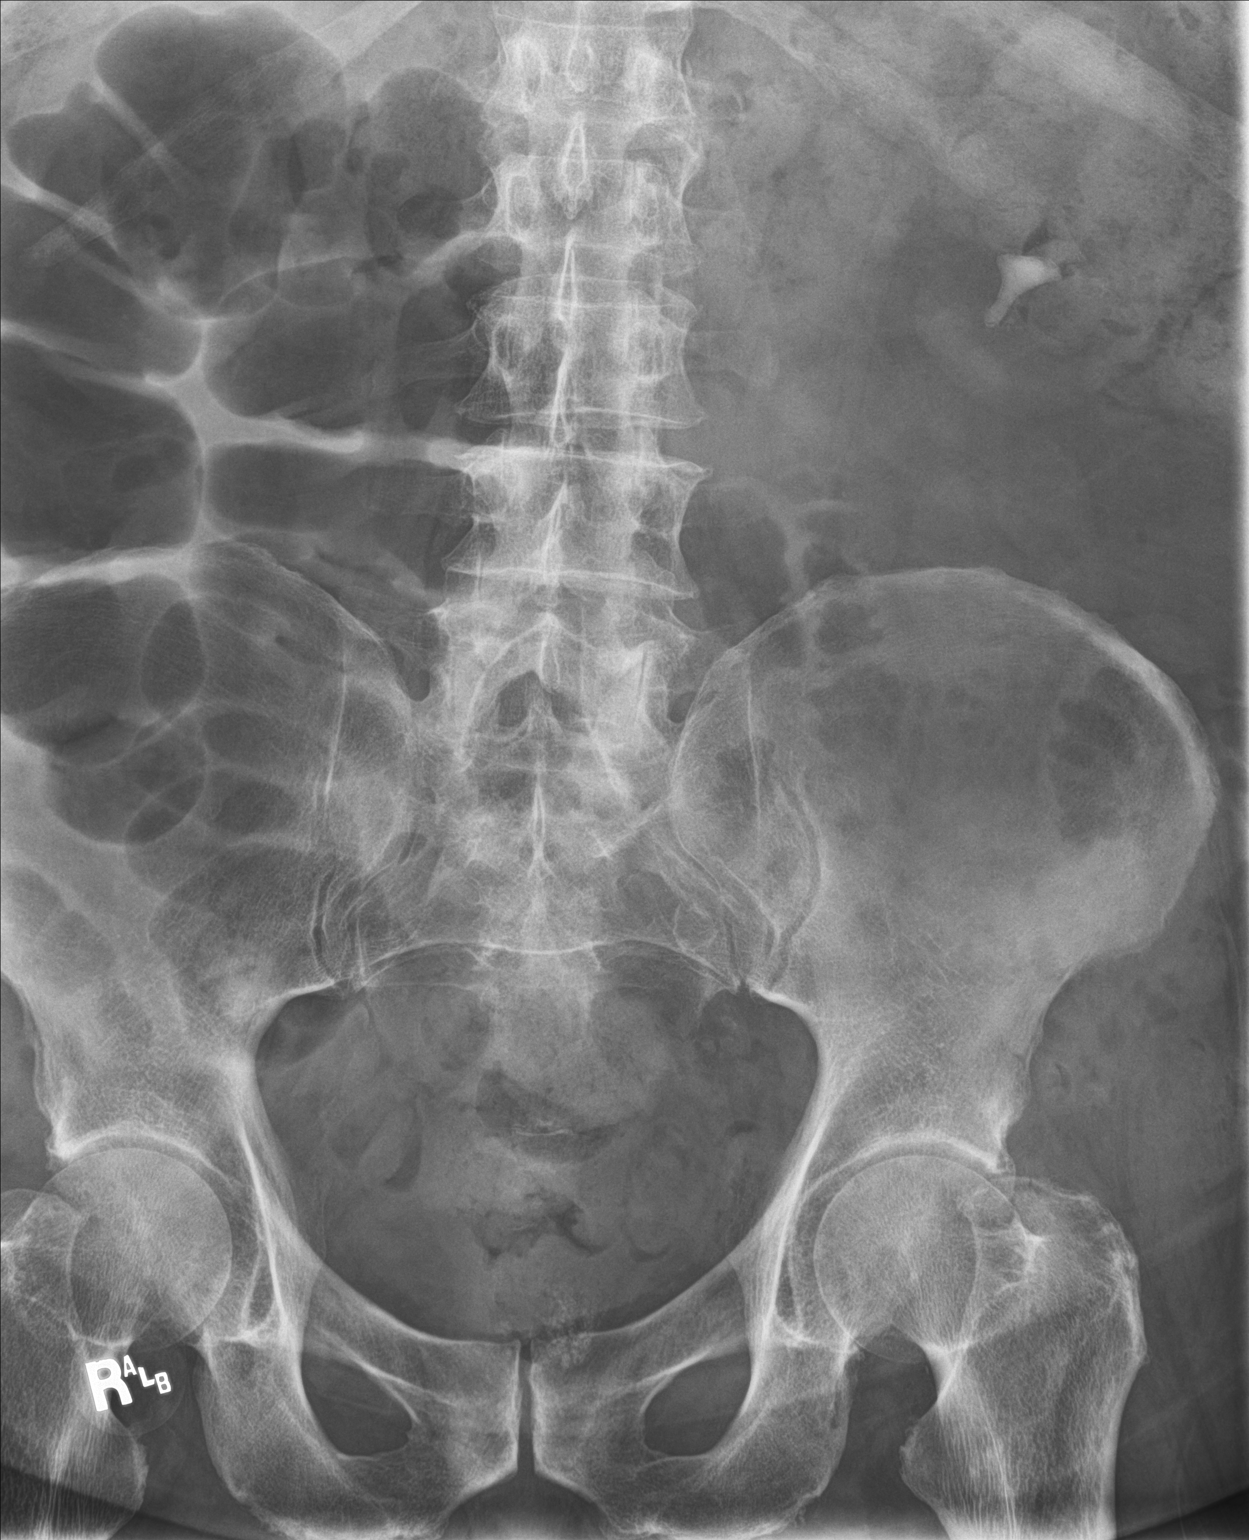

[abdomen kub (2 of 2)]
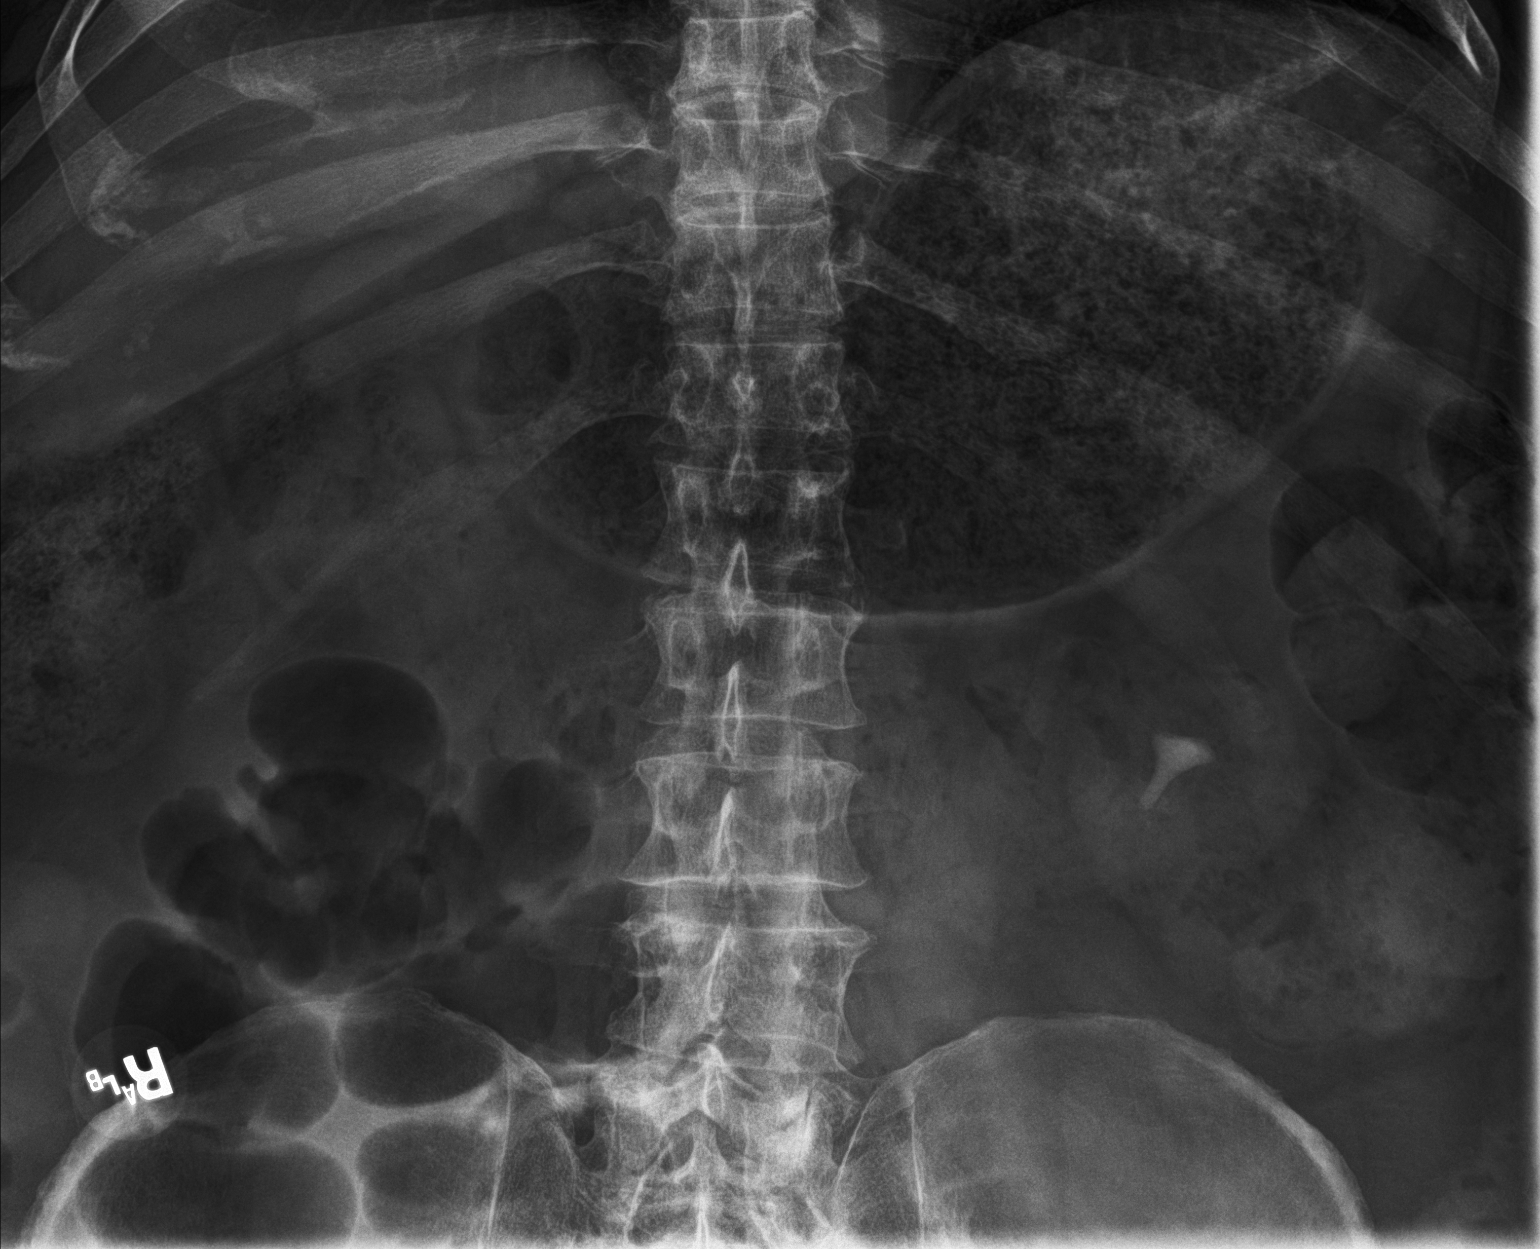

[2 of 2 positions shown; findings below may reference images not displayed]

FINDINGS: Large left proximal ureteral stone no longer identified. Prominent
stone noted over the left lower renal pole. Prostate calcifications
noted. Colon is slightly prominent. No small bowel distention.
Degenerative changes lumbar spine and both hips.
IMPRESSION: Previously identified large proximal left ureteral stone no longer
visualized. Large left renal stone noted.

## 2016-05-25 NOTE — Progress Notes (Signed)
2:37 PM  05/25/16  [name] 1951-04-28 [id number]  Referring provider: Vidal Schwalbe, MD [address] [place], [place] [address]  Chief Complaint  Patient presents with  . Nephrolithiasis    5 month follow up    HPI: 65 year old male with multiple comorbidities including CHF (EF 15-20%) and A. Fib found to have extensive left ureteral stone burden with atrophy (numerous left ureteral calcifications including a 2.2 cm, 2.5 cm left proximal stone along with a 1 cm left distal stone). He underwent left percutaneous nephrostomy tube placement   He was taken to the operating room on 2 separate occasions for staged ureteroscopic procedure, most recently 10/2015.   Follow-up renal ultrasound did prove resolution of his hydronephrosis with possible residual 9 mm stone, nonobstructing in the left kidney.  He does have baseline CKD, creatinine appears to possibly be improved from baseline. No fevers or chills. No dysuria.   Prior to above, he does have an extensive personal history of kidney stones. He underwent ESWL 2 and ureteroscopy in the past but has not seen a urologist in over 10 years. He's had intermittent flank pain for numerous years but never sought intervention.  Stone analysis shows 90% calcium oxalate monohydrate, 5% calcium phosphate, and 5% of an unknown urate.  He returns today for routine follow-up.  KUB today shows interval enlarging right lower pole stone, now up to 2 cm in length. Previous renal ultrasound 5 months ago measured the stone is approximately 9 mm.  He does endorse occasional left flank pain. He denies any significant urinary symptoms other than frequency related to diuretics.  Overall, he is doing fairly well. He's had no readmissions since his last visit.  PMH: Past Medical History:  Diagnosis Date  . Cardiomyopathy ([place])   . Chronic combined systolic (congestive) and diastolic (congestive) heart failure    a. EF 25% by cath in 2013 b. Echo in  07/2015 showing EF of 15-20%, moderate MR, moderate Pulm HTN, severely dilated IVC  . CKD (chronic kidney disease) stage 3, GFR 30-59 ml/min   . Coronary artery disease   . Diabetes mellitus type 2, uncontrolled ([place])    a. A1c 11.0 in 07/2015.  [name] Hypertension   . Kidney stones    Left  . New onset atrial fibrillation ([place])    a. diagnosed in 07/2015 b. started on Eliquis  . Paroxysmal atrial fibrillation ([facility])   . Pollen allergies 09-21-15   pt called and stated that he woke up and had some drainage-pt states he did not see the color of the drainage-pt denies running a fever and this only happened once-pt instructed to call Dr Audree Bane office if he starts running a fever or if the color of drainage becomes yellow/green  . Psoriasis   . Pulmonary hypertension   . Renal disorder    kidney stone  . Sleep apnea     Surgical History: Past Surgical History:  Procedure Laterality Date  . CARDIAC CATHETERIZATION N/A 07/25/2015   Procedure: Right/Left Heart Cath and Coronary Angiography;  Surgeon: Wellington Hampshire, MD;  Location: [facility];  Service: Cardiovascular;  Laterality: N/A;  . CARDIAC CATHETERIZATION     [place]  . CARDIAC CATHETERIZATION     [facility], [place]    . CYSTOSCOPY WITH STENT PLACEMENT Left 09/07/2015   Procedure: CYSTOSCOPY WITH STENT PLACEMENT;  Surgeon: Hollice Espy, MD;  Location: [facility];  Service: Urology;  Laterality: Left;  .  CYSTOSCOPY/URETEROSCOPY/HOLMIUM LASER/STENT PLACEMENT Left 10/25/2015   Procedure: CYSTOSCOPY/URETEROSCOPY/HOLMIUM LASER/STENT EXCHANGE;  Surgeon: Hollice Espy, MD;  Location: [facility];  Service: Urology;  Laterality: Left;  . KIDNEY SURGERY    . NEPHROSTOMY TUBE PLACEMENT ([place] HX) Left   . URETEROSCOPY WITH HOLMIUM LASER LITHOTRIPSY Left 09/07/2015   Procedure: URETEROSCOPY WITH HOLMIUM LASER LITHOTRIPSY;  Surgeon: Hollice Espy, MD;  Location: [facility];  Service: Urology;   Laterality: Left;    Home Medications:  Allergies as of 05/25/2016   No Known Allergies     Medication List       Accurate as of 05/25/16  2:37 PM. Always use your most recent med list.          amiodarone 200 MG tablet Commonly known as:  PACERONE Take 1 tablet (200 mg total) by mouth daily.   atorvastatin 40 MG tablet Commonly known as:  LIPITOR Take 1 tablet (40 mg total) by mouth daily at 6 PM.   b complex vitamins tablet Take 1 tablet by mouth daily.   baclofen 10 MG tablet Commonly known as:  LIORESAL Take 10 mg by mouth daily.   BENEFIBER Chew Chew 1 tablet by mouth daily as needed. Reported on 12/23/2015   carvedilol 6.25 MG tablet Commonly known as:  COREG TAKE 1 TABLET BY MOUTH TWICE DAILY   cholecalciferol 1000 units tablet Commonly known as:  VITAMIN D Take 2,000 Units by mouth daily.   CO Q 10 PO Take 400 mg by mouth daily.   Cranberry 500 MG Caps Take 1,000 mg by mouth daily.   ELIQUIS 5 MG Tabs tablet Generic drug:  apixaban TAKE 1 TABLET BY MOUTH TWICE DAILY   furosemide 80 MG tablet Commonly known as:  LASIX Take 1 tablet (80 mg total) by mouth 2 (two) times daily.   glipiZIDE 10 MG tablet Commonly known as:  GLUCOTROL Take 10 mg by mouth 2 (two) times daily.   Hawthorne Berry 550 MG Caps Take 550 mg by mouth daily.   losartan 25 MG tablet Commonly known as:  COZAAR TAKE 1 TABLET BY MOUTH ONCE DAILY.   metFORMIN 1000 MG tablet Commonly known as:  GLUCOPHAGE 1,000 mg 2 (two) times daily with a meal.   multivitamin-lutein Caps capsule Take 1 capsule by mouth 2 (two) times daily.   OVER THE COUNTER MEDICATION Take 3 capsules by mouth 2 (two) times daily. Reported on 12/23/2015   OVER THE COUNTER MEDICATION Take 1 capsule by mouth daily. Pt takes caprylic acid.   polyethylene glycol packet Commonly known as:  MIRALAX / GLYCOLAX Take 17 g by mouth daily as needed for mild constipation.   potassium phosphate (monobasic) 500  MG tablet Commonly known as:  K-PHOS ORIGINAL Take 1 tablet (500 mg total) by mouth daily.   spironolactone 25 MG tablet Commonly known as:  ALDACTONE Take 1 tablet (25 mg total) by mouth daily.   TURMERIC CURCUMIN PO Take 1 capsule by mouth daily.   VICTOZA 18 MG/3ML Sopn Generic drug:  liraglutide Inject 1.8 mg into the skin daily.   vitamin C 500 MG tablet Commonly known as:  ASCORBIC ACID Take 1,000 mg by mouth daily.   vitamin E 400 UNIT capsule Take 400 Units by mouth daily.       Allergies: No Known Allergies  Family History: Family History  Problem Relation Age of Onset  . Heart failure Father   . Heart attack Brother   . Kidney cancer Neg Hx   . Bladder Cancer  Neg Hx   . Prostate cancer Neg Hx     Social History:  reports that he has never smoked. He has never used smokeless tobacco. He reports that he drinks alcohol. He reports that he does not use drugs.   Physical Exam: BP (!) 145/83   Pulse 88   Ht 6' (1.829 m)   Wt 284 lb (128.8 kg)   BMI 38.52 kg/m   Constitutional:  Alert and oriented, No acute distress. HEENT: [place] AT, moist mucus membranes.  Trachea midline, no masses. Cardiovascular: No clubbing, cyanosis.  Respiratory: Normal respiratory effort, no increased work of breathing.   GI: Abdomen is soft, nontender, nondistended, no abdominal masses.  Obese. GU: Mild left superficial flank tenderness over previous nephrostomy tube site.  Skin: No rashes, bruises or suspicious lesions. Neurologic: Grossly intact, no focal deficits, moving all 4 extremities. Psychiatric: Normal mood and affect.  Laboratory Data: Lab Results  Component Value Date   WBC 9.1 08/31/2015   HGB 13.3 10/25/2015   HCT 39.0 10/25/2015   MCV 93.3 08/31/2015   PLT 193 08/31/2015    Lab Results  Component Value Date   CREATININE 1.37 (H) 11/22/2015    Lab Results  Component Value Date   HGBA1C 11.0 (H) 07/08/2015     Pertinent Imaging: CLINICAL DATA:   Kidney stones.  EXAM: ABDOMEN - 1 VIEW  COMPARISON:  CT 07/25/2015 .  FINDINGS: Large left proximal ureteral stone no longer identified. Prominent stone noted over the left lower renal pole. Prostate calcifications noted. Colon is slightly prominent. No small bowel distention. Degenerative changes lumbar spine and both hips.  IMPRESSION: Previously identified large proximal left ureteral stone no longer visualized. Large left renal stone noted.   Electronically Signed   By: Marcello Moores  Register   On: 05/25/2016 13:58   KUB reviewed personally today and with the patient  Assessment & Plan:    1. Left ureteral stone S/p L staged URS, Successfully treated large burden of left chronic ureteral stone burden No residual hydro on RUS, possible left nonobstructive fragment on RUS but may represent debris/ clot Previously offered [id number] urine metabolic workup but declined at this patient will unlikely be able to tolerate any medical intervention given additional comorbidity/polypharmacy KUB today shows significant progression of left-sided stone border and within fairly atrophic left kidney. Options were reviewed today including proceeding with ESWL, PCNL, versus ureteroscopy, versus continued observation. At this point in time, he is completely asymptomatic and would prefer to continue to observe the stone. Given his multiple medical comorbidities, I feel this is reasonable.  We'll plan for close follow-up in 3 months with KUB. It also like to check his renal function with BMP at that appointment.  UA mildly suspicious today, will check urine culture despite being asymptomatic to rule out infection.  2. 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 CKD Stable vs. Mildly improved Most recent Cr 1.33 11/2015  Return in about 3 months (around 08/23/2016) for KUB/ BMP (if not already performed).  Hollice Espy, MD  [facility] [address] [place], [place] [address] [phone]

## 2016-05-27 LAB — URINE CULTURE

## 2016-06-25 ENCOUNTER — Other Ambulatory Visit: Payer: Self-pay | Admitting: Internal Medicine

## 2016-07-19 IMAGING — CR DG ABDOMEN 1V
2 series · 2 of 2 positions shown · non-contrast
Comparison: [DATE]

CLINICAL DATA: Follow-up renal stone on left

EXAM:
ABDOMEN - 1 VIEW

[abdomen kub (1 of 2)]
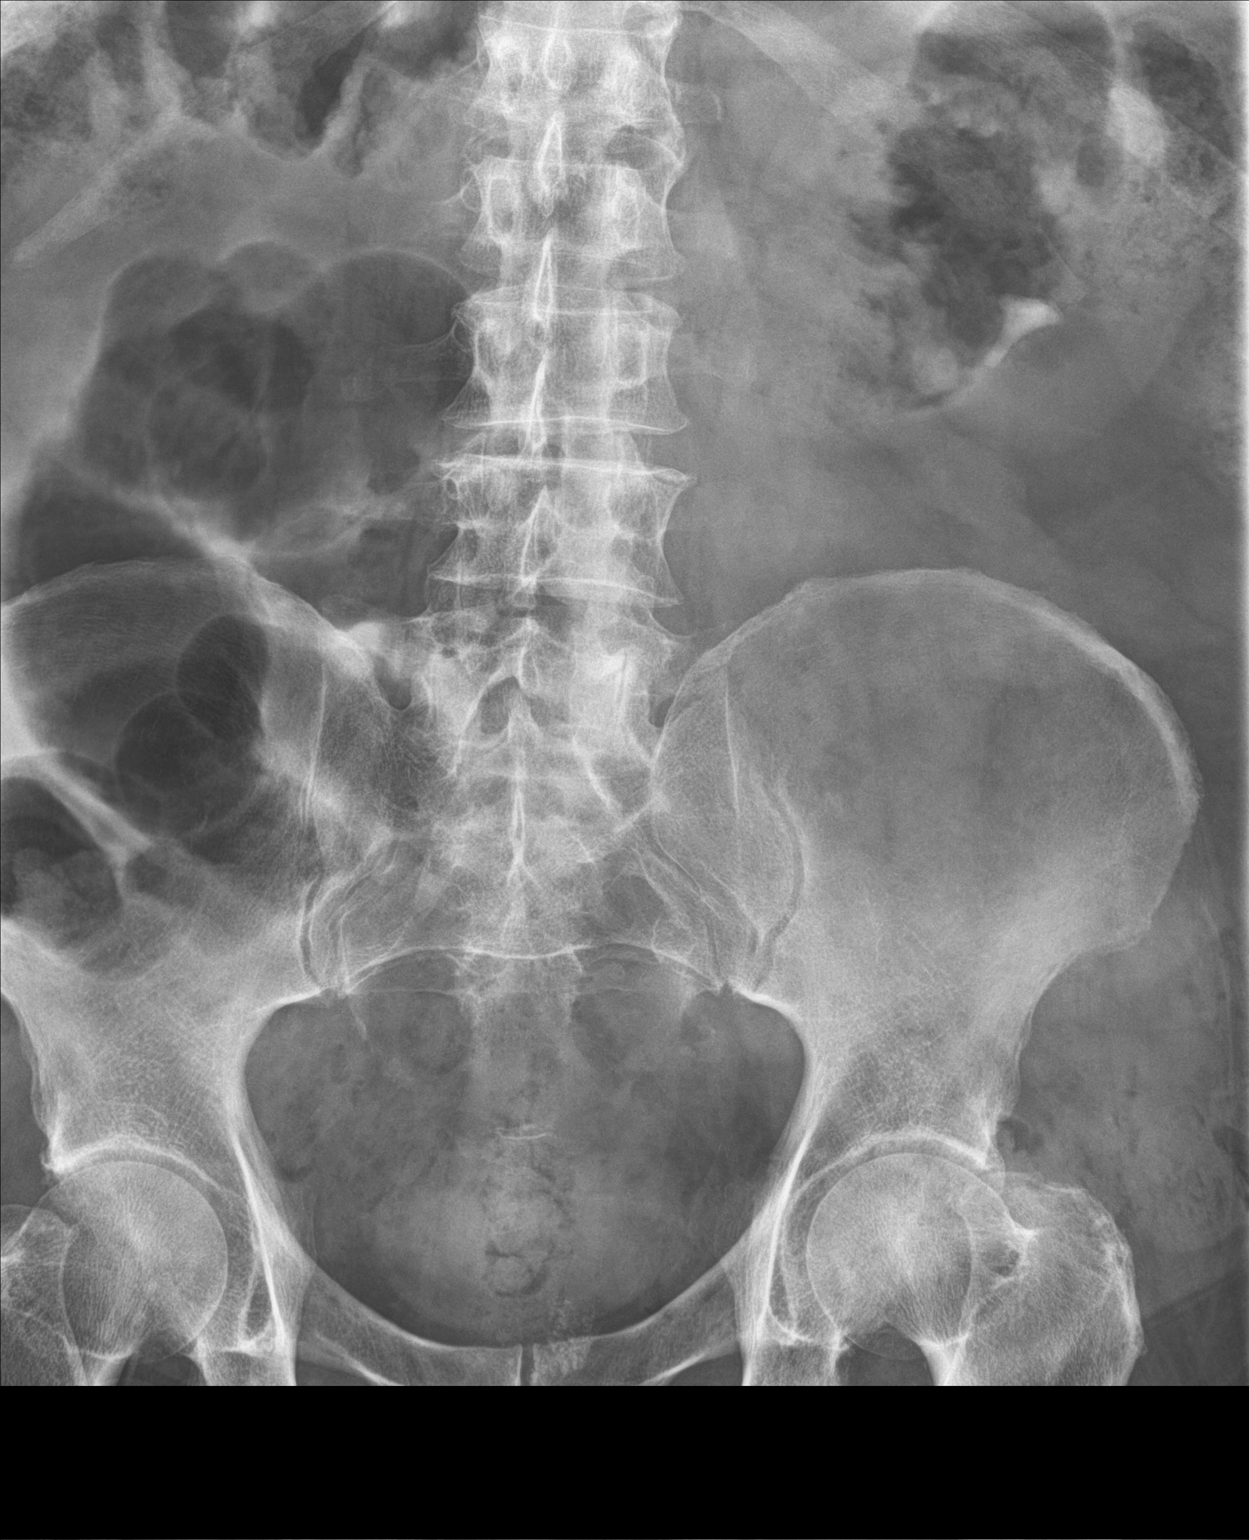

[abdomen kub (2 of 2)]
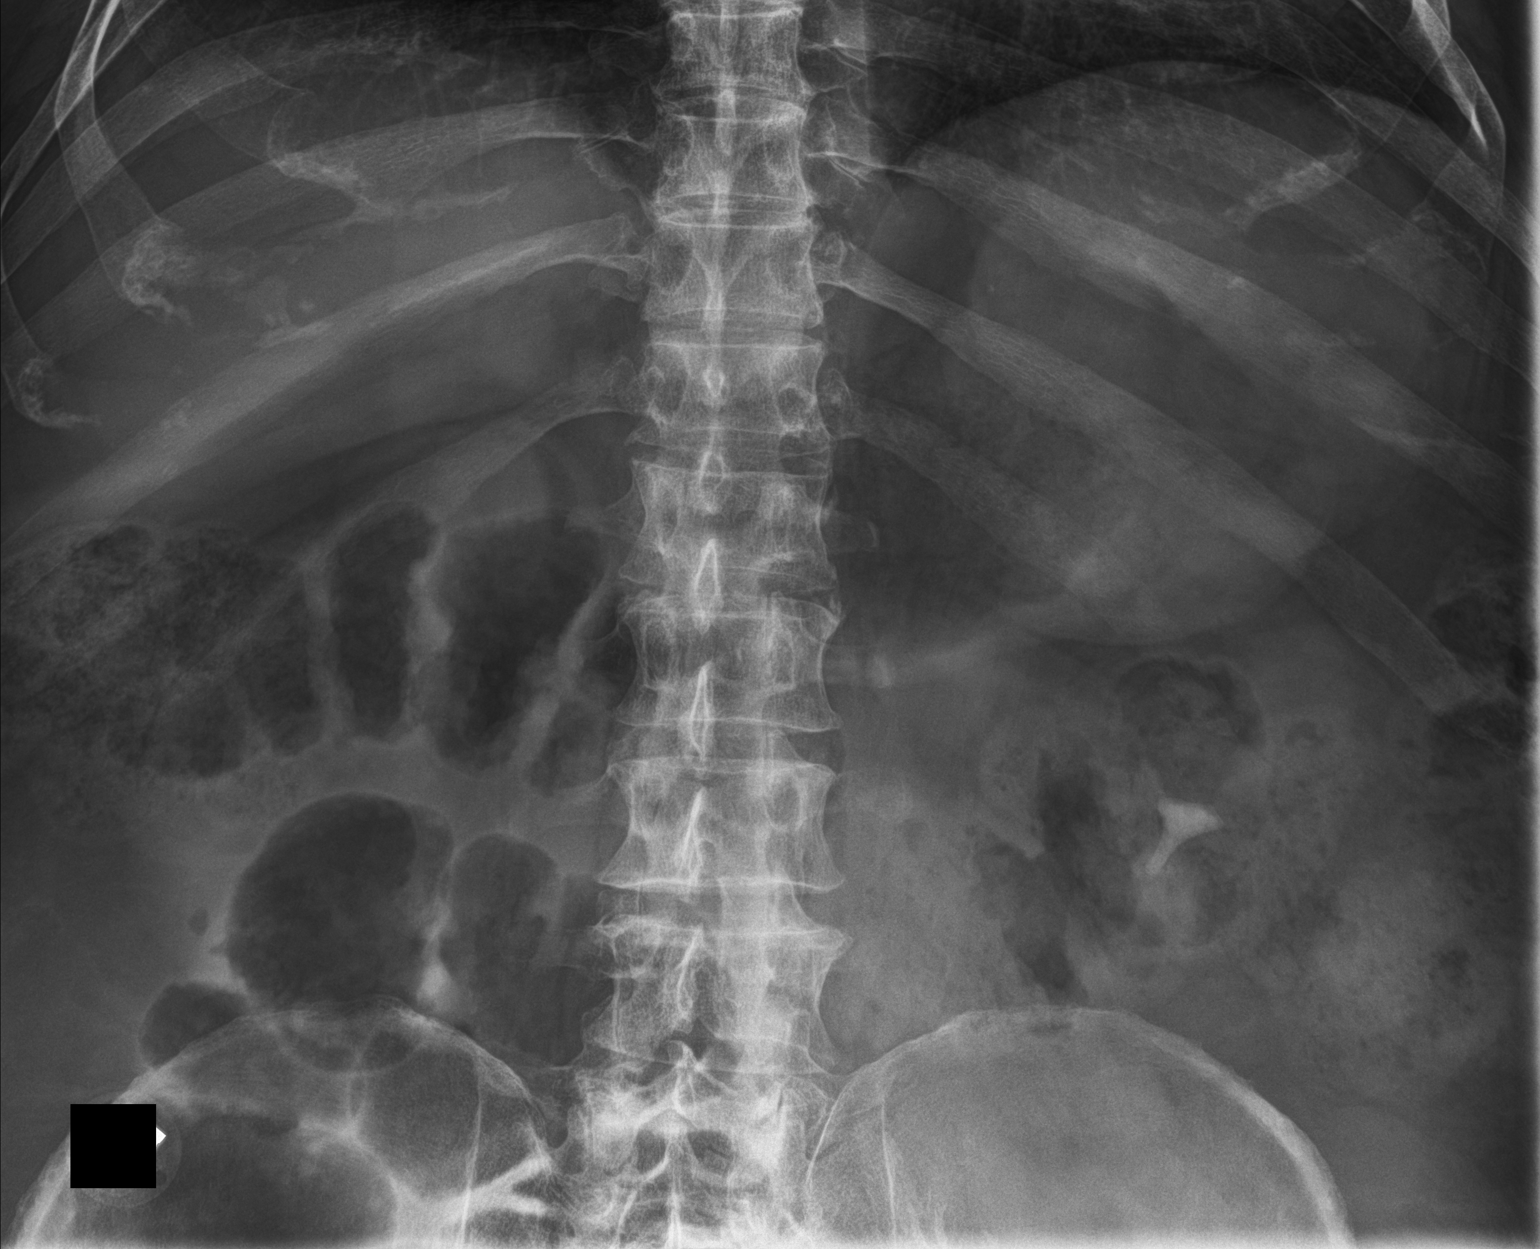

[2 of 2 positions shown; findings below may reference images not displayed]

FINDINGS: Small staghorn calculus is noted in the lower pole of the left
kidney. It is stable in appearance from the prior exam. No new
calculi are seen. Degenerative changes of lumbar spine are noted. No
obstructive changes are seen.
IMPRESSION: Stable left renal calculus

## 2016-07-30 ENCOUNTER — Other Ambulatory Visit: Payer: Self-pay | Admitting: Cardiology

## 2016-07-30 ENCOUNTER — Other Ambulatory Visit: Payer: Self-pay | Admitting: Internal Medicine

## 2016-07-30 NOTE — Telephone Encounter (Signed)
Requested Eliquis 5 mg; pt wt-129.1kg on 05/25/16, Cre-1.37 on 11/22/15, Age-66yrs old. Med sent as requested.

## 2016-08-24 ENCOUNTER — Ambulatory Visit
Admission: RE | Admit: 2016-08-24 | Discharge: 2016-08-24 | Disposition: A | Discharge status: Home / Self Care | Payer: BLUE CROSS/BLUE SHIELD | Source: Ambulatory Visit | Attending: Urology | Admitting: Urology

## 2016-08-24 ENCOUNTER — Other Ambulatory Visit: Payer: Self-pay | Admitting: *Deleted

## 2016-08-24 ENCOUNTER — Ambulatory Visit (INDEPENDENT_AMBULATORY_CARE_PROVIDER_SITE_OTHER): Payer: BLUE CROSS/BLUE SHIELD | Admitting: Urology

## 2016-08-24 ENCOUNTER — Other Ambulatory Visit: Payer: Self-pay | Admitting: Family Medicine

## 2016-08-24 ENCOUNTER — Encounter: Payer: Self-pay | Admitting: Urology

## 2016-08-24 VITALS — BP 140/71 | HR 86 | Ht 72.0 in | Wt 286.2 lb

## 2016-08-24 DIAGNOSIS — N200 Calculus of kidney: Secondary | ICD-10-CM

## 2016-08-24 DIAGNOSIS — N261 Atrophy of kidney (terminal): Secondary | ICD-10-CM | POA: Diagnosis not present

## 2016-08-24 DIAGNOSIS — N183 Chronic kidney disease, stage 3 unspecified: Secondary | ICD-10-CM

## 2016-08-24 DIAGNOSIS — N189 Chronic kidney disease, unspecified: Secondary | ICD-10-CM

## 2016-08-24 NOTE — Progress Notes (Signed)
2:00 PM  08/26/16  Megan Hayduk [facility] [birth year]  [id number]  Referring provider: Vidal Schwalbe, MD [address] [place], [place] [address]  Chief Complaint  Patient presents with  . Follow-up    3 month follow up  kidney stone/ CKD    HPI: 66 year old male with multiple comorbidities including CHF (EF 15-20%) and A. Fib found to have extensive left ureteral stone burden with atrophy (numerous left ureteral calcifications including a 2.2 cm, 2.5 cm left proximal stone along with a 1 cm left distal stone). He underwent left percutaneous nephrostomy tube placement.   He was taken to the operating room on 2 separate occasions for staged ureteroscopic procedure, most recently 10/2015.   Follow-up renal ultrasound did prove resolution of his hydronephrosis with possible residual 9 mm stone, nonobstructing in the left kidney.  Prior to above, he does have an extensive personal history of kidney stones. He underwent ESWL 2 and ureteroscopy in the past but has not seen a urologist in over 10 years. He's had intermittent flank pain for numerous years but never sought intervention.  Stone analysis shows 90% calcium oxalate monohydrate, 5% calcium phosphate, and 5% of an unknown urate.  He does have baseline CKD, Most recent creatinine 1.38 on 08/08/2016. No fevers or chills. No dysuria. No flank pain.  Occasional rare gross hematuria.  Frequency related to lasix.    He returns today for routine follow-up.  KUB today shows stable right lower pole stone, 2 cm in length from 3 months ago.   PMH: Past Medical History:  Diagnosis Date  . Cardiomyopathy ([place])   . Chronic combined systolic (congestive) and diastolic (congestive) heart failure    a. EF 25% by cath in 2013 b. Echo in 07/2015 showing EF of 15-20%, moderate MR, moderate Pulm HTN, severely dilated IVC  . CKD (chronic kidney disease) stage 3, GFR 30-59 ml/min   . Coronary artery disease   . Diabetes mellitus type 2, uncontrolled  ([place])    a. A1c 11.0 in 07/2015.  [name] Hypertension   . Kidney stones    Left  . New onset atrial fibrillation ([place])    a. diagnosed in 07/2015 b. started on Eliquis  . Paroxysmal atrial fibrillation (H[facility])   . Pollen allergies 09-21-15   pt called and stated that he woke up and had some drainage-pt states he did not see the color of the drainage-pt denies running a fever and this only happened once-pt instructed to call Dr Audree Bane office if he starts running a fever or if the color of drainage becomes yellow/green  . Psoriasis   . Pulmonary hypertension   . Renal disorder    kidney stone  . Sleep apnea     Surgical History: Past Surgical History:  Procedure Laterality Date  . CARDIAC CATHETERIZATION N/A 07/25/2015   Procedure: Right/Left Heart Cath and Coronary Angiography;  Surgeon: Wellington Hampshire, MD;  Location: [facility];  Service: Cardiovascular;  Laterality: N/A;  . CARDIAC [place]HETERIZATION     [place]  . CARDIAC CATHETERIZATION     [facility], [place]    . CYSTOSCOPY WITH STENT PLACEMENT Left 09/07/2015   Procedure: CYSTOSCOPY WITH STENT PLACEMENT;  Surgeon: Hollice Espy, MD;  Location: [facility];  Service: Urology;  Laterality: Left;  . CYSTOSCOPY/URETEROSCOPY/HOLMIUM LASER/STENT PLACEMENT Left 10/25/2015   Procedure: CYSTOSCOPY/URETEROSCOPY/HOLMIUM LASER/STENT EXCHANGE;  Surgeon: Hollice Espy, MD;  Location: [facility];  Service: Urology;  Laterality: Left;  .  KIDNEY SURGERY    . NEPHROSTOMY TUBE PLACEMENT ([place] HX) Left   . URETEROSCOPY WITH HOLMIUM LASER LITHOTRIPSY Left 09/07/2015   Procedure: URETEROSCOPY WITH HOLMIUM LASER LITHOTRIPSY;  Surgeon: Hollice Espy, MD;  Location: [facility];  Service: Urology;  Laterality: Left;    Home Medications:  Allergies as of 08/24/2016   No Known Allergies     Medication List       Accurate as of 08/24/16 11:59 PM. Always use your most recent med list.            amiodarone 200 MG tablet Commonly known as:  PACERONE Take 1 tablet (200 mg total) by mouth daily.   atorvastatin 40 MG tablet Commonly known as:  LIPITOR Take 1 tablet (40 mg total) by mouth daily at 6 PM.   b complex vitamins tablet Take 1 tablet by mouth daily.   baclofen 10 MG tablet Commonly known as:  LIORESAL Take 10 mg by mouth daily.   BENEFIBER Chew Chew 1 tablet by mouth daily as needed. Reported on 12/23/2015   BENEFIBER DRINK MIX PO Take by mouth.   carvedilol 6.25 MG tablet Commonly known as:  COREG TAKE 1 TABLET BY MOUTH TWICE DAILY   cholecalciferol 1000 units tablet Commonly known as:  VITAMIN D Take 2,000 Units by mouth daily.   CO Q 10 PO Take 400 mg by mouth daily.   Cranberry 500 MG Caps Take 1,000 mg by mouth daily.   ELIQUIS 5 MG Tabs tablet Generic drug:  apixaban TAKE 1 TABLET BY MOUTH TWICE DAILY   furosemide 80 MG tablet Commonly known as:  LASIX Take 1 tablet (80 mg total) by mouth 2 (two) times daily.   glipiZIDE 10 MG tablet Commonly known as:  GLUCOTROL Take 10 mg by mouth 2 (two) times daily.   Hawthorne Berry 550 MG Caps Take 550 mg by mouth daily.   losartan 25 MG tablet Commonly known as:  COZAAR TAKE 1 TABLET BY MOUTH ONCE DAILY.   metFORMIN 1000 MG tablet Commonly known as:  GLUCOPHAGE 1,000 mg 2 (two) times daily with a meal.   MULTI-VITAMINS Tabs Take by mouth.   multivitamin-lutein Caps capsule Take 1 capsule by mouth 2 (two) times daily.   OVER THE COUNTER MEDICATION Take 3 capsules by mouth 2 (two) times daily. Reported on 12/23/2015   OVER THE COUNTER MEDICATION Take 1 capsule by mouth daily. Pt takes caprylic acid.   phosphorus 155-852-130 MG tablet Commonly known as:  K PHOS NEUTRAL Take by mouth.   polyethylene glycol packet Commonly known as:  MIRALAX / GLYCOLAX Take 17 g by mouth daily as needed for mild constipation.   potassium phosphate (monobasic) 500 MG tablet Commonly known as:  K-PHOS  ORIGINAL Take 1 tablet (500 mg total) by mouth daily.   spironolactone 25 MG tablet Commonly known as:  ALDACTONE Take 1 tablet (25 mg total) by mouth daily.   TURMERIC CURCUMIN PO Take 1 capsule by mouth daily.   Ubidecarenone Powd Take by mouth.   VICTOZA 18 MG/3ML Sopn Generic drug:  liraglutide Inject 1.8 mg into the skin daily.   liraglutide 18 MG/3ML Sopn Inject into the skin.   vitamin C 500 MG tablet Commonly known as:  ASCORBIC ACID Take 1,000 mg by mouth daily.   vitamin E 400 UNIT capsule Take 400 Units by mouth daily.       Allergies: No Known Allergies  Family History: Family History  Problem Relation Age of Onset  .  Heart failure Father   . Heart attack Brother   . Kidney cancer Neg Hx   . Bladder Cancer Neg Hx   . Prostate cancer Neg Hx     Social History:  reports that he has never smoked. He has never used smokeless tobacco. He reports that he drinks alcohol. He reports that he does not use drugs.   Physical Exam: BP 140/71   Pulse 86   Ht 6' (1.829 m)   Wt 286 lb 4 oz (129.8 kg)   BMI 38.82 kg/m   Constitutional:  Alert and oriented, No acute distress. HEENT: [place] AT, moist mucus membranes.  Trachea midline, no masses. Cardiovascular: No clubbing, cyanosis.  Respiratory: Normal respiratory effort, no increased work of breathing.   GI: Abdomen is soft, nontender, nondistended, no abdominal masses.  Obese. GU: No CVA tenderness.  Skin: No rashes, bruises or suspicious lesions. Neurologic: Grossly intact, no focal deficits, moving all 4 extremities. Psychiatric: Normal mood and affect.  Laboratory Data:  CLINICAL DATA:  Follow-up renal stone on left  EXAM: ABDOMEN - 1 VIEW  COMPARISON:  05/25/2016  FINDINGS: Small staghorn calculus is noted in the lower pole of the left kidney. It is stable in appearance from the prior exam. No new calculi are seen. Degenerative changes of lumbar spine are noted. No obstructive changes are  seen.  IMPRESSION: Stable left renal calculus   Electronically Signed   By: Inez Catalina M.D.   On: 08/24/2016 13:40  KUB reviewed personally today and with the patient  Assessment & Plan:    1. Left ureteral stone S/p L staged URS, successfully treated large burden of left chronic ureteral stone burden Previously offered [id number] urine metabolic workup but declined at this patient will unlikely be able to tolerate any medical intervention given additional comorbidity/polypharmacy KUB stable within fairly atrophic left kidney, 2 cm measuring   Given his multiple medical comorbidities, he desires continued observation  f/u  6 months with UA/ KUB  2. 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 CKD Stable at baseline  Return in about 6 months (around 02/24/2017) for KUB.  Hollice Espy, MD  [facility] [address] [place], [place] [address] [phone]

## 2016-09-27 ENCOUNTER — Other Ambulatory Visit: Payer: Self-pay | Admitting: Internal Medicine

## 2016-09-27 NOTE — Telephone Encounter (Signed)
Pt requesting refill carvedilol 6.25 mg tablet ok to refill?

## 2016-09-27 NOTE — Telephone Encounter (Signed)
Dr. Yvone Neu pt.

## 2016-10-10 ENCOUNTER — Ambulatory Visit (INDEPENDENT_AMBULATORY_CARE_PROVIDER_SITE_OTHER): Payer: BLUE CROSS/BLUE SHIELD | Admitting: Cardiology

## 2016-10-10 ENCOUNTER — Encounter: Payer: Self-pay | Admitting: Cardiology

## 2016-10-10 VITALS — BP 128/60 | HR 82 | Ht 72.0 in | Wt 285.5 lb

## 2016-10-10 DIAGNOSIS — I440 Atrioventricular block, first degree: Secondary | ICD-10-CM | POA: Diagnosis not present

## 2016-10-10 DIAGNOSIS — I2510 Atherosclerotic heart disease of native coronary artery without angina pectoris: Secondary | ICD-10-CM

## 2016-10-10 DIAGNOSIS — I10 Essential (primary) hypertension: Secondary | ICD-10-CM | POA: Diagnosis not present

## 2016-10-10 DIAGNOSIS — Z6838 Body mass index (BMI) 38.0-38.9, adult: Secondary | ICD-10-CM

## 2016-10-10 DIAGNOSIS — E66812 Obesity, class 2: Secondary | ICD-10-CM

## 2016-10-10 DIAGNOSIS — E6609 Other obesity due to excess calories: Secondary | ICD-10-CM

## 2016-10-10 DIAGNOSIS — I480 Paroxysmal atrial fibrillation: Secondary | ICD-10-CM | POA: Diagnosis not present

## 2016-10-10 DIAGNOSIS — E784 Other hyperlipidemia: Secondary | ICD-10-CM

## 2016-10-10 DIAGNOSIS — E7849 Other hyperlipidemia: Secondary | ICD-10-CM

## 2016-10-10 DIAGNOSIS — I5022 Chronic systolic (congestive) heart failure: Secondary | ICD-10-CM

## 2016-10-10 NOTE — Addendum Note (Signed)
Addended by: Valora Corporal on: 10/10/2016 03:56 PM   Modules accepted: Orders

## 2016-10-10 NOTE — Patient Instructions (Signed)
Testing/Procedures: Your physician has requested that you have an echocardiogram. Echocardiography is a painless test that uses sound waves to create images of your heart. It provides your doctor with information about the size and shape of your heart and how well your heart's chambers and valves are working. This procedure takes approximately one hour. There are no restrictions for this procedure.  Your physician has recommended that you have a pulmonary function test. Pulmonary Function Tests are a group of tests that measure how well air moves in and out of your lungs.    Follow-Up: Your physician wants you to follow-up in: 6 months. You will receive a reminder letter in the mail two months in advance. If you don't receive a letter, please call our office to schedule the follow-up appointment.  It was a pleasure seeing you today here in the office. Please do not hesitate to give [place] a call back if you have any further questions. [place], BSN

## 2016-10-10 NOTE — Progress Notes (Signed)
Cardiology Office Note   Date:  10/10/2016   ID:  [name], [name] 1950-10-16, MRN [id number]  Referring Doctor:  Vidal Schwalbe, MD   Cardiologist:   Wende Bushy, MD   Reason for consultation:  Chief Complaint  Patient presents with  . other    6 month follow up. Patient denies chest pain and SOB. Patient has a kidney stone and has talked about the option of taking his kidney. Patient Meds reviewed verbally with patient.       History of Present Illness: [name] is a 66 y.o. male who presents forFollow-up for CHF   Review of records: In terms of congestive heart failure, he has been aware of a weakened heart for many years now. He was told that he had a heart attack but he does not recall an event in the past where he had significant chest pain.  Since last visit, he has been doing well. BP has been ok on every doctor's visit. No CP, no SOB. He admits he has not been very active. He has been indulging in jelly beans recently and vows to not buy anymore. He continues to work on sugar control. He uses CPAP every night.   In terms of afib, no palpitations, no problems with [place].  His PCP has been monitoring his labs.   ROS:  Please see the history of present illness. Aside from mentioned under HPI, all other systems are reviewed and negative.       Past Medical History:  Diagnosis Date  . Cardiomyopathy ([place])   . Chronic combined systolic (congestive) and diastolic (congestive) heart failure (HCC)    a. EF 25% by cath in 2013 b. Echo in 07/2015 showing EF of 15-20%, moderate MR, moderate Pulm HTN, severely dilated IVC  . CKD (chronic kidney disease) stage 3, GFR 30-59 ml/min   . Coronary artery disease   . Diabetes mellitus type 2, uncontrolled ([place])    a. A1c 11.0 in 07/2015.  [name] Hypertension   . Kidney stones    Left  . New onset atrial fibrillation ([place])    a. diagnosed in 07/2015 b. started on Eliquis  . Paroxysmal atrial fibrillation (HCC)   .  Pollen allergies 09-21-15   pt called and stated that he woke up and had some drainage-pt states he did not see the color of the drainage-pt denies running a fever and this only happened once-pt instructed to call Dr Audree Bane office if he starts running a fever or if the color of drainage becomes yellow/green  . Psoriasis   . Pulmonary hypertension ([place])   . Renal disorder    kidney stone  . Sleep apnea     Past Surgical History:  Procedure Laterality Date  . CARDIAC CATHETERIZATION N/A 07/25/2015   Procedure: Right/Left Heart Cath and Coronary Angiography;  Surgeon: Wellington Hampshire, MD;  Location: [place] [facility];  Service: Cardiovascular;  Laterality: N/A;  . CARDIAC CATHE[place]IZATION     [place]  . CARDIAC CATHETERIZATION     [facility], [place]    . CYSTOSCOPY WITH STENT PLACEMENT Left 09/07/2015   Procedure: CYSTOSCOPY WITH STENT PLACEMENT;  Surgeon: Hollice Espy, MD;  Location: [facility];  Service: Urology;  Laterality: Left;  . CYSTOSCOPY/URETEROSCOPY/HOLMIUM LASER/STENT PLACEMENT Left 10/25/2015   Procedure: CYSTOSCOPY/URETEROSCOPY/HOLMIUM LASER/STENT EXCHANGE;  Surgeon: Hollice Espy, MD;  Location: [facility];  Service: Urology;  Laterality: Left;  . KIDNEY SURGERY    .  NEPHROSTOMY TUBE PLACEMENT ([place] HX) Left   . URETEROSCOPY WITH HOLMIUM LASER LITHOTRIPSY Left 09/07/2015   Procedure: URETEROSCOPY WITH HOLMIUM LASER LITHOTRIPSY;  Surgeon: Hollice Espy, MD;  Location: [facility];  Service: Urology;  Laterality: Left;     reports that he has never smoked. He has never used smokeless tobacco. He reports that he drinks alcohol. He reports that he does not use drugs.   family history includes Heart attack in his brother; Heart failure in his father.   Current Outpatient Prescriptions  Medication Sig Dispense Refill  . amiodarone (PACERONE) 200 MG tablet Take 1 tablet (200 mg total) by mouth daily. (Patient taking differently:  Take 200 mg by mouth every morning. ) 30 tablet 0  . atorvastatin (LIPITOR) 40 MG tablet Take 1 tablet (40 mg total) by mouth daily at 6 PM. 30 tablet 0  . b complex vitamins tablet Take 1 tablet by mouth daily.    . baclofen (LIORESAL) 10 MG tablet Take 10 mg by mouth daily.    . carvedilol (COREG) 6.25 MG tablet TAKE 1 TABLET BY MOUTH TWICE DAILY 60 tablet 0  . cholecalciferol (VITAMIN D) 1000 units tablet Take 2,000 Units by mouth daily.    . Coenzyme Q10 (CO Q 10 PO) Take 400 mg by mouth daily.    . Coenzyme Q10 (UBIDECARENONE) POWD Take by mouth.    . Cranberry 500 MG CAPS Take 1,000 mg by mouth daily.    [name] ELIQUIS 5 MG TABS tablet TAKE 1 TABLET BY MOUTH TWICE DAILY 60 tablet 5  . furosemide (LASIX) 80 MG tablet Take 1 tablet (80 mg total) by mouth 2 (two) times daily. 60 tablet 0  . glipiZIDE (GLUCOTROL) 10 MG tablet Take 10 mg by mouth 2 (two) times daily.     [name] Hawthorne Berry 550 MG CAPS Take 550 mg by mouth daily.    [name] liraglutide (VICTOZA) 18 MG/3ML SOPN Inject 1.8 mg into the skin daily.    [name] liraglutide 18 MG/3ML SOPN Inject into the skin.    [name] losartan (COZAAR) 25 MG tablet TAKE 1 TABLET BY MOUTH ONCE DAILY. 30 tablet 6  . metFORMIN (GLUCOPHAGE) 1000 MG tablet 1,000 mg 2 (two) times daily with a meal.   3  . montelukast (SINGULAIR) 10 MG tablet Take 10 mg by mouth daily.    . Multiple Vitamin (MULTI-VITAMINS) TABS Take by mouth.    . multivitamin-lutein (OCUVITE-LUTEIN) CAPS capsule Take 1 capsule by mouth 2 (two) times daily.    [name] OVER THE COUNTER MEDICATION Take 3 capsules by mouth 2 (two) times daily. Reported on 12/23/2015    . OVER THE COUNTER MEDICATION Take 1 capsule by mouth daily. Pt takes caprylic acid.    . phosphorus (K PHOS NEUTRAL) 155-852-130 MG tablet Take by mouth.    . polyethylene glycol (MIRALAX / GLYCOLAX) packet Take 17 g by mouth daily as needed for mild constipation.    . potassium phosphate, monobasic, (K-PHOS ORIGINAL) 500 MG tablet Take 1 tablet (500 mg  total) by mouth daily. 30 tablet 0  . spironolactone (ALDACTONE) 25 MG tablet Take 1 tablet (25 mg total) by mouth daily. 30 tablet 0  . TURMERIC CURCUMIN PO Take 1 capsule by mouth daily.    . vitamin C (ASCORBIC ACID) 500 MG tablet Take 1,000 mg by mouth daily.    . vitamin E 400 UNIT capsule Take 400 Units by mouth daily.    . Wheat Dextrin (BENEFIBER DRINK MIX PO) Take by  mouth.    . Wheat Dextrin (BENEFIBER) CHEW Chew 1 tablet by mouth daily as needed. Reported on 12/23/2015     No current facility-administered medications for this visit.     Allergies: Patient has no known allergies.    PHYSICAL EXAM: VS:  BP 128/60 (BP Location: Left Arm, Patient Position: Sitting, Cuff Size: Normal)   Pulse 82   Ht 6' (1.829 m)   Wt 285 lb 8 oz (129.5 kg)   BMI 38.72 kg/m  , Body mass index is 38.72 kg/m. Wt Readings from Last 3 Encounters:  10/10/16 285 lb 8 oz (129.5 kg)  08/24/16 286 lb 4 oz (129.8 kg)  05/25/16 284 lb (128.8 kg)    PHYSICAL EXAM: VS:  BP 128/60 (BP Location: Left Arm, Patient Position: Sitting, Cuff Size: Normal)   Pulse 82   Ht 6' (1.829 m)   Wt 285 lb 8 oz (129.5 kg)   BMI 38.72 kg/m  , BMI Body mass index is 38.72 kg/m. GENERAL:  well developed, well nourished, obese, not in acute distress HEENT: normocephalic, pink conjunctivae, anicteric sclerae, no xanthelasma, normal dentition, oropharynx clear NECK:  no neck vein engorgement, JVP normal, no hepatojugular reflux, carotid upstroke brisk and symmetric, no bruit, no thyromegaly, no lymphadenopathy LUNGS:  good respiratory effort, clear to auscultation bilaterally CV:  PMI not displaced, no thrills, no lifts, S1 and S2 within normal limits, no palpable S3 or S4, no murmurs, no rubs, no gallops ABD:  Soft, nontender, nondistended, normoactive bowel sounds, no abdominal aortic bruit, no hepatomegaly, no splenomegaly MS: nontender back, no kyphosis, no scoliosis, no joint deformities EXT:  2+ DP/PT pulses, no  edema, no varicosities, no cyanosis, no clubbing SKIN: warm, nondiaphoretic, normal turgor, no ulcers NEUROPSYCH: alert, oriented to person, place, and time, sensory/motor grossly intact, normal mood, appropriate affect    Recent Labs: 10/25/2015: Hemoglobin 13.3 11/22/2015: ALT 25; BUN 39; Creatinine, Ser 1.37; Potassium 5.7; Sodium 139; TSH 2.050   Lipid Panel    Component Value Date/Time   CHOL 153 07/09/2015 0455   TRIG 168 (H) 07/09/2015 0455   HDL 34 (L) 07/09/2015 0455   CHOLHDL 4.5 07/09/2015 0455   VLDL 34 07/09/2015 0455   LDLCALC 85 07/09/2015 0455   Outside labs 08/08/2016: BUN 35 Creatinine 1.38 Sodium 138 Potassium 4.7 Chloride 100 Carbon dioxide 32  Total cholesterol 133 Triglycerides 339 HDL 40 LDL 57  A1c 6.4 (8.6 in 03/2016)   Other studies Reviewed:  EKG:   The ekg ordered 08/15/2015 was personally reviewed by me and it reveals sinus rhythm first degree AV block. 85 BPM. LAFB. Cannot rule out anterior infarct.  EKG from 09/22/2015 was personally reviewed by me and reveals sinus rhythm with first-degree AV block. 74 BPM. LAFB. Cannot rule out anterior infarct. No significant change from EKG 08/15/2015. QTC 490 ms.  EKG from 11/22/2015 was personally reviewed by me and it reveals sinus rhythm, 73 BPM. PR interval 208 ms. LAFB.  Additional studies/ records that were reviewed personally reviewed by me today include:  Echocardiogram 07/09/2015:  LV moderately dilated. Mild concentric hypertrophy. EF 15-20%. Possible Akinesis of anterior septal, anterior, anterolateral myocardium. Moderate MR. Left atrium moderately dilated. RV moderately dilated. Systolic function moderately reduced. R a mildly dilated. IVC severely dilated.  Right and left heart cath 07/25/2015, Dr. Fletcher Anon:  Proximal LAD to mid LAD lesion, 60% stenosed. Previously treated with a stent. Distal RCA 60% stenosed. Ostial second diagonal to second diagonal, 70% stenosed. Moderately elevated  filling  pressures with moderate pulmonary hypertension. Hemodynamic features suggestive of restrictive physiology Moderate two-vessel CAD. Patent proximal LAD stent with 60% in-stent restenosis. LAD and RCA moderately calcified. PA pressure: 56/34 with a mean of 42, LV pressure: 95/15 with left ventricular end-diastolic pressure of 26. Cardiac output was 4.86 with a cardiac index of 1.9.  Echo 10/13/2015: - Left ventricle: The cavity size was mildly dilated. There was  mild concentric hypertrophy. Systolic function was moderately  reduced. The estimated ejection fraction was in the range of 35%  to 40%. Images were inadequate for LV wall motion assessment.  Doppler parameters are consistent with abnormal left ventricular  relaxation (grade 1 diastolic dysfunction). - Pulmonary arteries: Systolic pressure was within the normal  range.  Impressions:  - EF improved since February with resolution of pulmonary  hypertension.   Echo 02/21/2016: Left ventricle: The cavity size was mildly dilated. Wall   thickness was normal. Systolic function was moderately reduced.   The estimated ejection fraction was in the range of 35% to 40%.  ASSESSMENT AND PLAN:  congestive heart failure, systolic, chronic CHF, not in acute decompensation, EF 15-20%, improved to 35-40% (10/2015, 02/21/2016) Ischemic cardiomyopathy Biventricular congestive heart failure Patient appears euvolemic. Weight has been stable overall. Continue medical therapy: Carvedilol, losartan, Aldactone, Lasix, potassium replacement. Discussed with Dr Caryl Comes  that since his EF improved, there is no indication to proceed with ICD at this point. We both agreed to continue to monitor his ejection fraction.  [name] due for repeat echocardiogram sometime June 2018. Depending on EF, may consider Entresto. He had low BPs in the past on current regimen but it seems that BP has since improved.  Coronary artery disease, moderate  two-vessel CAD, no high-grade stenosis on last heart catheterization 07/25/2015.  No significant change and recommendation from last visit 05/08/2016: Consider aspirin 81 mg by mouth daily. Continue statin therapy. LDL goal less than 70.  Atrial fibrillation, likely paroxysmal, on antiarrhythmic medication, on anticoagulation, currently in sinus rhythm He was placed on antiarrhythmic during the time when his EF was significantly much lower. Continue amiodarone for now. Continue carvedilol  Continue [place].  Recommend to check LFTs or CMP, thyroid function test every 6 months, and PFTs annually since patient has been on amiodarone. He goes through PCP for his labs. PFT from June 2017 showed mild restriction, possibly from obesity, mild decreased DLCO that corrects partially for alveolar volume. Await official report. Patient is due for repeat PFTs. QT 41ms QTc 483ms on todays EKG.  Mitral regurgitation, moderate Likely due to cardiomyopathy. We'll reevaluate on echocardiogram.  First-degree AV block  on low-dose beta blockade. Continue to monitor. PR 242ms on todays EKG.   Hypertension BP is well controlled. Continue monitoring BP. Continue current medical therapy and lifestyle changes.   Hyperlipidemia  continue statin therapy. LDL goal less than 70. Last LDL was 57 from March 2018.  Obesity Body mass index is 38.72 kg/m. Recommend aggressive weight loss through diet and increased physical activity.    Sleep apnea [name] using CPAP every night.   Current medicines are reviewed at length with the patient today.  The patient does not have concerns regarding medicines.  Labs/ tests ordered today include:  Orders Placed This Encounter  Procedures  . Pulmonary Function Test [facility] Only  . EKG 12-Lead  . ECHOCARDIOGRAM COMPLETE    I had a lengthy and detailed discussion with the patient regarding diagnoses, prognosis, diagnostic options, treatment options, and side effects of  medications.  I counseled the patient on importance of lifestyle modification including heart healthy diet, regular physical activity.  Disposition:   FU with Cardiology in 6 months   I spent at least 25 minutes with the patient today and more than 50% of the time was spent counseling the patient and coordinating care.     Signed, Wende Bushy, MD  10/10/2016 12:56 PM    [place]

## 2016-10-18 ENCOUNTER — Ambulatory Visit: Payer: BLUE CROSS/BLUE SHIELD | Admitting: Cardiology

## 2016-10-26 ENCOUNTER — Other Ambulatory Visit: Payer: Self-pay | Admitting: Internal Medicine

## 2016-11-15 ENCOUNTER — Other Ambulatory Visit: Payer: Self-pay

## 2016-11-15 ENCOUNTER — Ambulatory Visit: Payer: BLUE CROSS/BLUE SHIELD | Attending: Cardiology

## 2016-11-15 ENCOUNTER — Ambulatory Visit (INDEPENDENT_AMBULATORY_CARE_PROVIDER_SITE_OTHER): Payer: BLUE CROSS/BLUE SHIELD

## 2016-11-15 DIAGNOSIS — I5022 Chronic systolic (congestive) heart failure: Secondary | ICD-10-CM | POA: Insufficient documentation

## 2017-01-22 ENCOUNTER — Other Ambulatory Visit: Payer: Self-pay | Admitting: Internal Medicine

## 2017-01-22 NOTE — Telephone Encounter (Signed)
This is Dr. Tora Kindred pt.

## 2017-01-22 NOTE — Telephone Encounter (Signed)
Refill Request former [place] Pt. Please advise for refill.

## 2017-02-22 ENCOUNTER — Ambulatory Visit
Admission: RE | Admit: 2017-02-22 | Discharge: 2017-02-22 | Disposition: A | Discharge status: Home / Self Care | Payer: BLUE CROSS/BLUE SHIELD | Source: Ambulatory Visit | Attending: Urology | Admitting: Urology

## 2017-02-22 ENCOUNTER — Ambulatory Visit (INDEPENDENT_AMBULATORY_CARE_PROVIDER_SITE_OTHER): Payer: BLUE CROSS/BLUE SHIELD | Admitting: Urology

## 2017-02-22 ENCOUNTER — Encounter: Payer: Self-pay | Admitting: Urology

## 2017-02-22 VITALS — BP 91/57 | HR 76 | Ht 72.0 in | Wt 286.0 lb

## 2017-02-22 DIAGNOSIS — N200 Calculus of kidney: Secondary | ICD-10-CM

## 2017-02-22 DIAGNOSIS — N183 Chronic kidney disease, stage 3 unspecified: Secondary | ICD-10-CM

## 2017-02-22 DIAGNOSIS — N261 Atrophy of kidney (terminal): Secondary | ICD-10-CM

## 2017-02-22 IMAGING — CR DG ABDOMEN 1V
2 series · 2 of 2 positions shown · non-contrast
Comparison: Plain film of the abdomen dated [DATE].

CLINICAL DATA: Pt states he is here for kidney stones left side.
Followup. No known issues or pain or other symptoms. Laser surgery x
2. Hx stones.

EXAM:
ABDOMEN - 1 VIEW

[abdomen kub (1 of 2)]
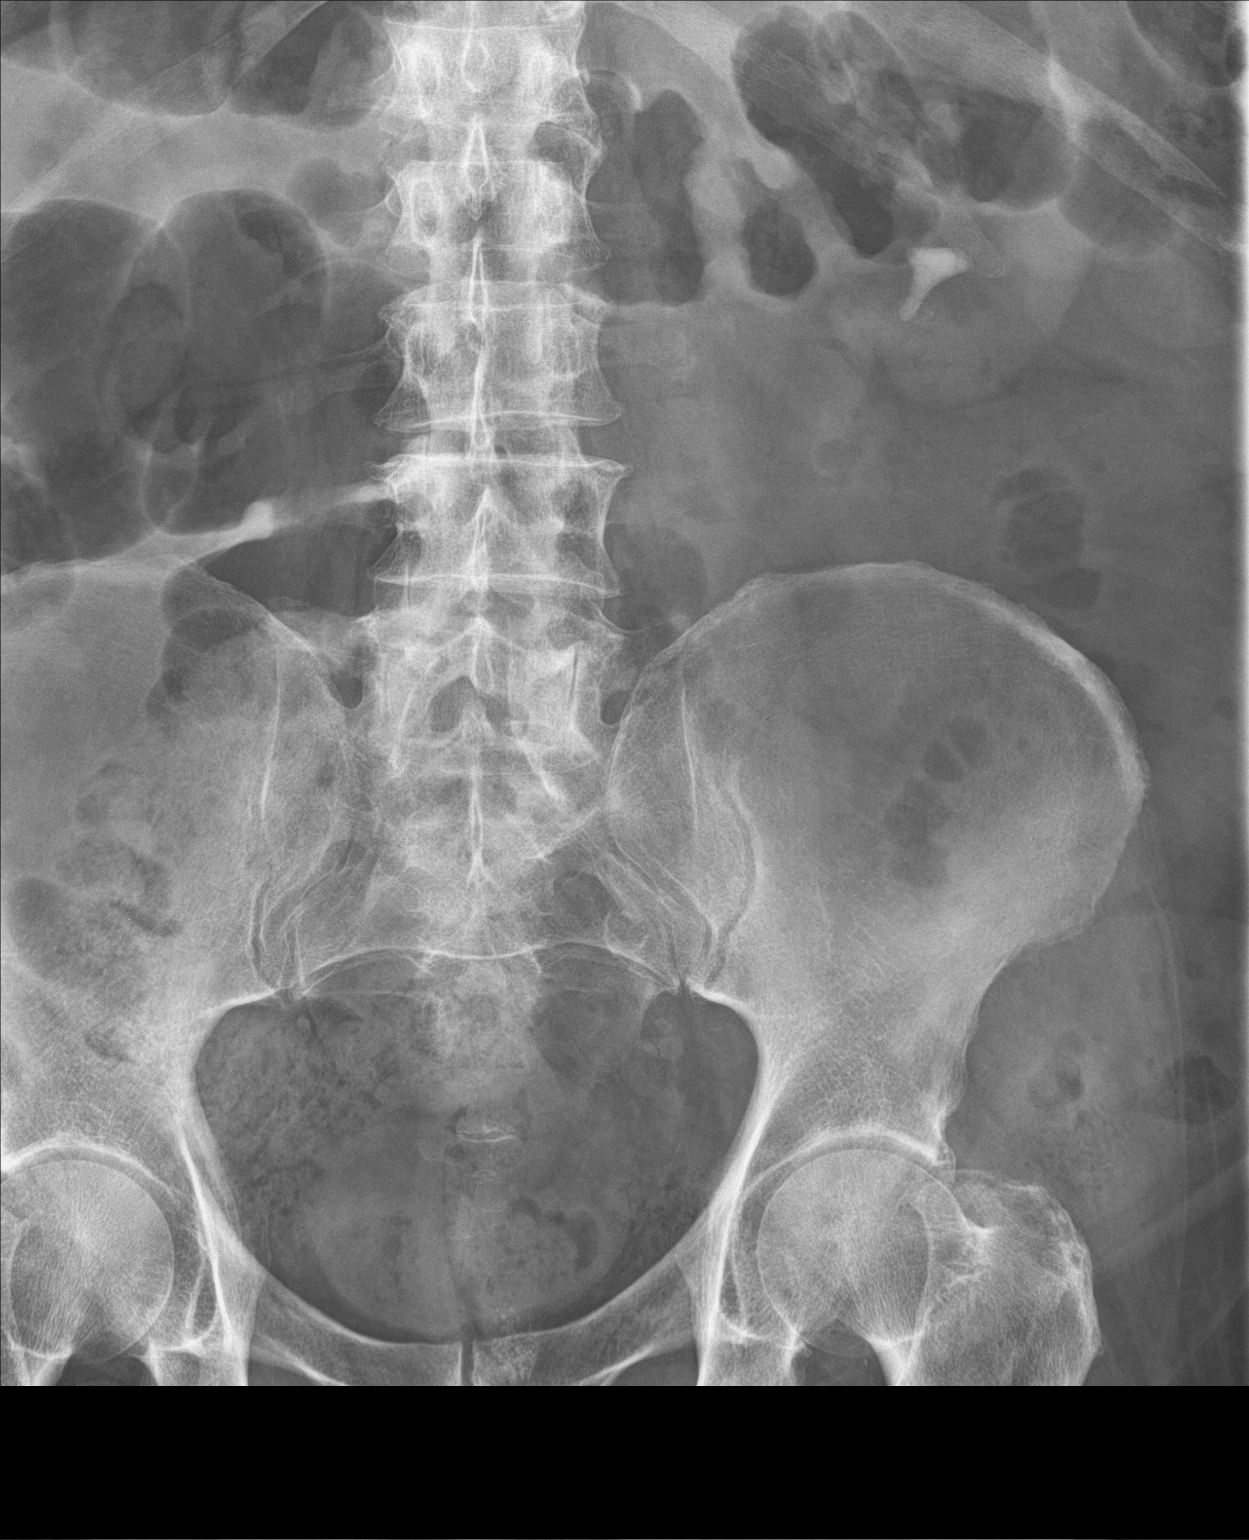

[abdomen kub (2 of 2)]
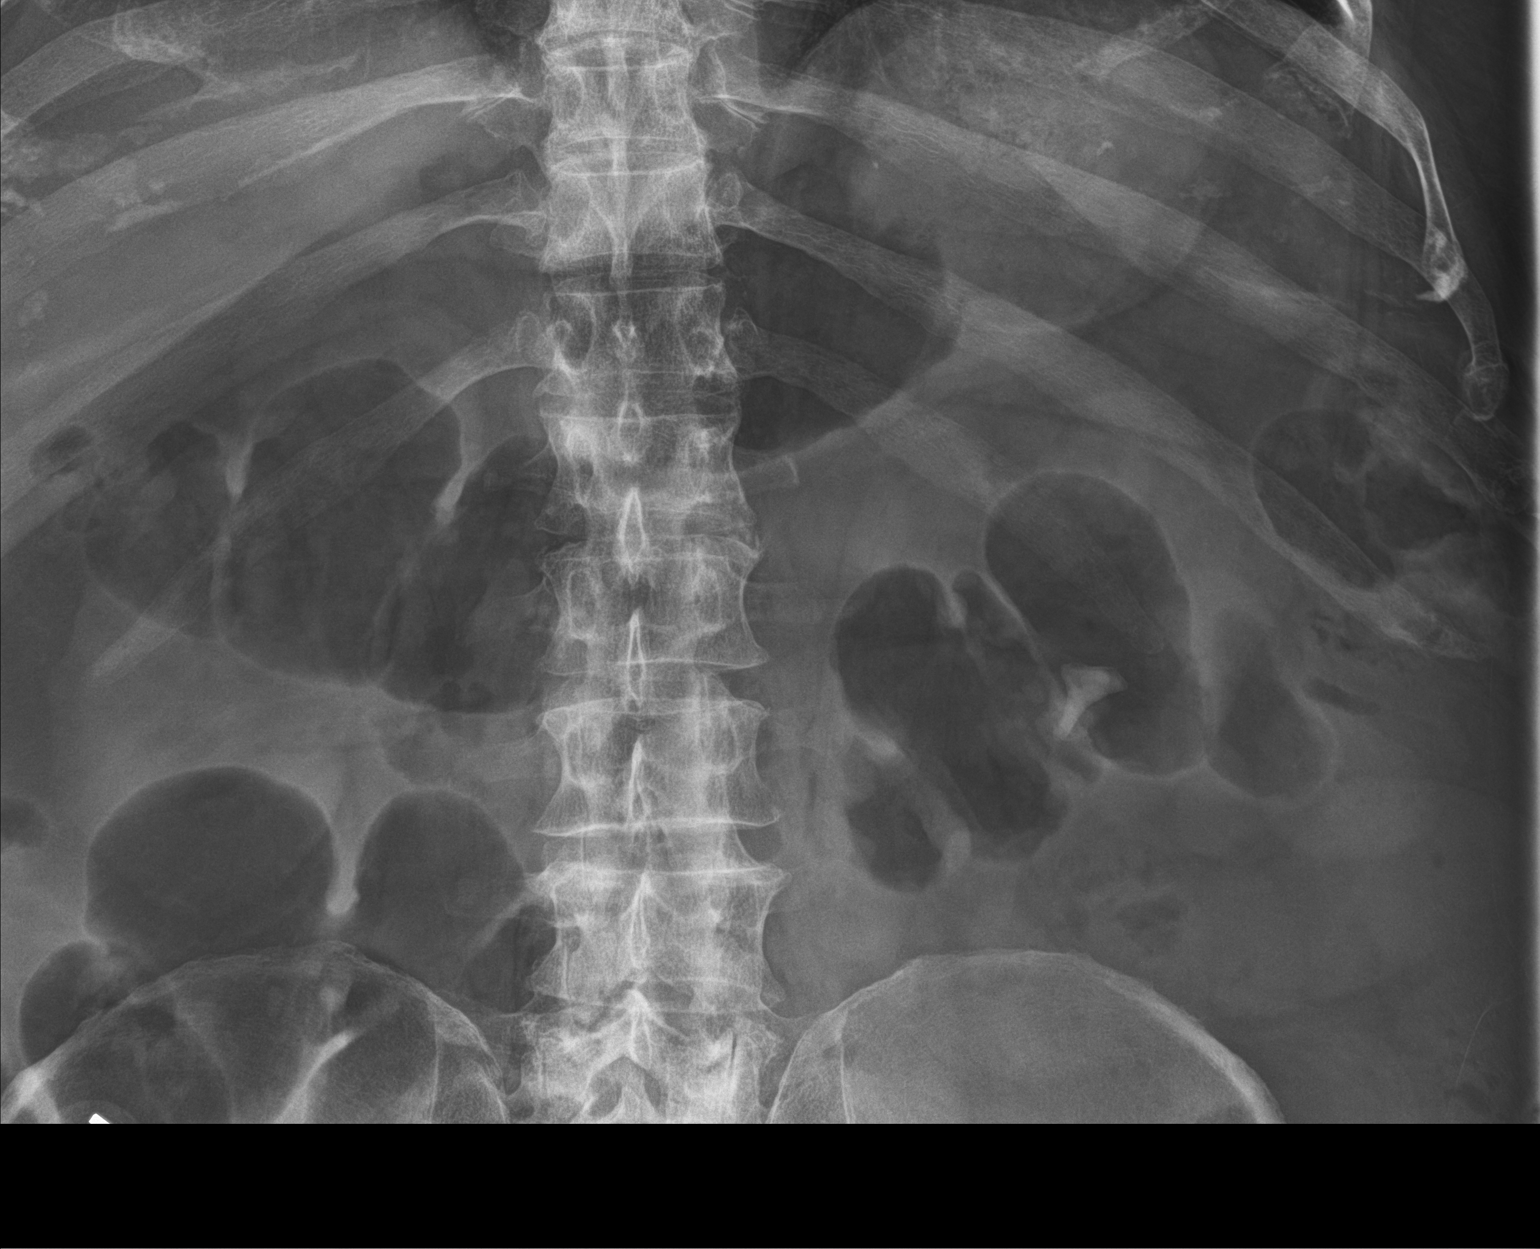

[2 of 2 positions shown; findings below may reference images not displayed]

FINDINGS: Small staghorn like calculus is again seen projected over the lower
pole of the left kidney, measuring 2.4 cm greatest dimension. No
right-sided renal stone or evidence of ureteral stones seen.

Visualized bowel gas pattern is nonobstructive. Mild degenerative
change again noted within the lower lumbar spine.
IMPRESSION: 1. Stable left renal calculus, staghorn configuration, measuring
approximately 2.4 cm greatest dimension.
2. No acute findings.

## 2017-02-22 NOTE — Progress Notes (Signed)
8:18 PM  02/24/17  [name] [facility] November 18, 1950  [id number]  Referring provider: Vidal Schwalbe, MD [address] [place], [place] [address]  Chief Complaint  Patient presents with  . Nephrolithiasis    21month w/KUB    HPI: 66 year old male with multiple comorbidities including CHF (EF 15-20%) and A. Fib found to have extensive left ureteral stone burden with atrophy (numerous left ureteral calcifications including a 2.2 cm, 2.5 cm left proximal stone along with a 1 cm left distal stone). He underwent left percutaneous nephrostomy tube placement.   He was taken to the operating room on 2 separate occasions for staged ureteroscopic procedure, most recently 10/2015.   Follow-up renal ultrasound did prove resolution of his hydronephrosis with possible residual 9 mm stone, nonobstructing in the left kidney.  Prior to above, he does have an extensive personal history of kidney stones. He underwent ESWL 2 and ureteroscopy in the past but has not seen a urologist in over 10 years. He's had intermittent flank pain for numerous years but never sought intervention.  Stone analysis shows 90% calcium oxalate monohydrate, 5% calcium phosphate, and 5% of an unknown urate.   He does have baseline CKD, Most recent creatinine 1.38 on 08/08/2016. No fevers or chills. No dysuria. No flank pain.  Occasional rare gross hematuria.  Frequency related to lasix.    He returns today for routine follow-up.  KUB today shows stable right lower pole stone, 2 cm in length from 6 months ago. Overall, this is unchanged    PMH: Past Medical History:  Diagnosis Date  . Cardiomyopathy ([place])   . Chronic combined systolic (congestive) and diastolic (congestive) heart failure (HCC)    a. EF 25% by cath in 2013 b. Echo in 07/2015 showing EF of 15-20%, moderate MR, moderate Pulm HTN, severely dilated IVC  . CKD (chronic kidney disease) stage 3, GFR 30-59 ml/min   . Coronary artery disease   . Diabetes mellitus type  2, uncontrolled ([place])    a. A1c 11.0 in 07/2015.  [name] Hypertension   . Kidney stones    Left  . New onset atrial fibrillation ([place])    a. diagnosed in 07/2015 b. started on Eliquis  . Paroxysmal atrial fibrillation (HCC)   . Pollen allergies 09-21-15   pt called and stated that he woke up and had some drainage-pt states he did not see the color of the drainage-pt denies running a fever and this only happened once-pt instructed to call Dr Audree Bane office if he starts running a fever or if the color of drainage becomes yellow/green  . Psoriasis   . Pulmonary hypertension ([place])   . Renal disorder    kidney stone  . Sleep apnea     Surgical History: Past Surgical History:  Procedure Laterality Date  . CARDIAC CATHETERIZATION N/A 07/25/2015   Procedure: Right/Left Heart Cath and Coronary Angiography;  Surgeon: Wellington Hampshire, MD;  Location: [place] [facility];  Service: Cardiovascular;  Laterality: N/A;  . CARDIAC CATHETERIZATION     [place]  . CARDIAC CATHETERIZATION     [facility], [place]    . CYSTOSCOPY WITH STENT PLACEMENT Left 09/07/2015   Procedure: CYSTOSCOPY WITH STENT PLACEMENT;  Surgeon: Hollice Espy, MD;  Location: [facility];  Service: Urology;  Laterality: Left;  . CYSTOSCOPY/URETEROSCOPY/HOLMIUM LASER/STENT PLACEMENT Left 10/25/2015   Procedure: CYSTOSCOPY/URETEROSCOPY/HOLMIUM LASER/STENT EXCHANGE;  Surgeon: Hollice Espy, MD;  Location: [facility];  Service: Urology;  Laterality:  Left;  . KIDNEY SURGERY    . NEPHROSTOMY TUBE PLACEMENT ([place] HX) Left   . URETEROSCOPY WITH HOLMIUM LASER LITHOTRIPSY Left 09/07/2015   Procedure: URETEROSCOPY WITH HOLMIUM LASER LITHOTRIPSY;  Surgeon: Hollice Espy, MD;  Location: [facility];  Service: Urology;  Laterality: Left;    Home Medications:  Allergies as of 02/22/2017   No Known Allergies     Medication List       Accurate as of 02/22/17 11:59 PM. Always use your most recent med  list.          amiodarone 200 MG tablet Commonly known as:  PACERONE Take 1 tablet (200 mg total) by mouth daily.   atorvastatin 40 MG tablet Commonly known as:  LIPITOR Take 1 tablet (40 mg total) by mouth daily at 6 PM.   b complex vitamins tablet Take 1 tablet by mouth daily.   baclofen 10 MG tablet Commonly known as:  LIORESAL Take 10 mg by mouth daily.   carvedilol 6.25 MG tablet Commonly known as:  COREG TAKE 1 TABLET BY MOUTH TWICE DAILY   cholecalciferol 1000 units tablet Commonly known as:  VITAMIN D Take 2,000 Units by mouth daily.   clonazePAM 1 MG tablet Commonly known as:  KLONOPIN   CO Q 10 PO Take 400 mg by mouth daily.   Cranberry 500 MG Caps Take 1,000 mg by mouth daily.   ELIQUIS 5 MG Tabs tablet Generic drug:  apixaban TAKE 1 TABLET BY MOUTH TWICE DAILY   fenofibrate 145 MG tablet Commonly known as:  TRICOR   furosemide 80 MG tablet Commonly known as:  LASIX Take 1 tablet (80 mg total) by mouth 2 (two) times daily.   glipiZIDE 10 MG tablet Commonly known as:  GLUCOTROL Take 10 mg by mouth 2 (two) times daily.   Hawthorne Berry 550 MG Caps Take 550 mg by mouth daily.   losartan 25 MG tablet Commonly known as:  COZAAR TAKE 1 TABLET BY MOUTH ONCE DAILY.   metFORMIN 1000 MG tablet Commonly known as:  GLUCOPHAGE 1,000 mg 2 (two) times daily with a meal.   montelukast 10 MG tablet Commonly known as:  SINGULAIR Take 10 mg by mouth daily.   MULTI-VITAMINS Tabs Take by mouth.   multivitamin-lutein Caps capsule Take 1 capsule by mouth 2 (two) times daily.   OVER THE COUNTER MEDICATION Take 3 capsules by mouth 2 (two) times daily. Reported on 12/23/2015   OVER THE COUNTER MEDICATION Take 1 capsule by mouth daily. Pt takes caprylic acid.   polyethylene glycol packet Commonly known as:  MIRALAX / GLYCOLAX Take 17 g by mouth daily as needed for mild constipation.   potassium phosphate (monobasic) 500 MG tablet Commonly known as:   K-PHOS ORIGINAL Take 1 tablet (500 mg total) by mouth daily.   spironolactone 25 MG tablet Commonly known as:  ALDACTONE Take 1 tablet (25 mg total) by mouth daily.   TURMERIC CURCUMIN PO Take 1 capsule by mouth daily.   VICTOZA 18 MG/3ML Sopn Generic drug:  liraglutide Inject 1.8 mg into the skin daily.   vitamin C 500 MG tablet Commonly known as:  ASCORBIC ACID Take 1,000 mg by mouth daily.   vitamin E 400 UNIT capsule Take 400 Units by mouth daily.            Discharge Care Instructions        Start     Ordered   02/22/17 0000  DG Abd 1 View  Question Answer Comment  Reason for Exam (SYMPTOM  OR DIAGNOSIS REQUIRED) kidney stones   Preferred imaging location? [facility]      02/22/17 1402      Allergies: No Known Allergies  Family History: Family History  Problem Relation Age of Onset  . Heart failure Father   . Heart attack Brother   . Kidney cancer Neg Hx   . Bladder Cancer Neg Hx   . Prostate cancer Neg Hx     Social History:  reports that he has never smoked. He has never used smokeless tobacco. He reports that he drinks alcohol. He reports that he does not use drugs.   Physical Exam: BP (!) 91/57   Pulse 76   Ht 6' (1.829 m)   Wt 286 lb (129.7 kg)   BMI 38.79 kg/m   Constitutional:  Alert and oriented, No acute distress. HEENT: [place] AT, moist mucus membranes.  Trachea midline, no masses. Cardiovascular: No clubbing, cyanosis.  Respiratory: Normal respiratory effort, no increased work of breathing.   GI: Abdomen is soft, nontender, nondistended, no abdominal masses.  Obese. GU: No CVA tenderness.  Skin: No rashes, bruises or suspicious lesions. Neurologic: Grossly intact, no focal deficits, moving all 4 extremities. Psychiatric: Normal mood and affect.  Laboratory Data: CLINICAL DATA:  Pt states he is here for kidney stones left side. Followup. No known issues or pain or other symptoms. Laser surgery x 2. Hx  stones.  EXAM: ABDOMEN - 1 VIEW  COMPARISON:  Plain film of the abdomen dated 08/24/2016.  FINDINGS: Small staghorn like calculus is again seen projected over the lower pole of the left kidney, measuring 2.4 cm greatest dimension. No right-sided renal stone or evidence of ureteral stones seen.  Visualized bowel gas pattern is nonobstructive. Mild degenerative change again noted within the lower lumbar spine.  IMPRESSION: 1. Stable left renal calculus, staghorn configuration, measuring approximately 2.4 cm greatest dimension. 2. No acute findings.   Electronically Signed   By: Franki Cabot M.D.   On: 02/22/2017 14:57  KUB reviewed personally today and with the patient  Assessment & Plan:    1. Left renal stone KUB stable within fairly atrophic left kidney, 2 cm measuring, essentially unchanged x 1 year  Given his multiple medical comorbidities, he desires continued observation f/u  12 months with UA/ KUB   2. 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 CKD Stable at baseline, monitored by PCP  Return in about 1 year (around 02/22/2018) for KUB. or sooner as needed  Hollice Espy, MD  [place] [phone]

## 2017-02-27 DIAGNOSIS — E785 Hyperlipidemia, unspecified: Secondary | ICD-10-CM | POA: Insufficient documentation

## 2017-02-27 DIAGNOSIS — E118 Type 2 diabetes mellitus with unspecified complications: Secondary | ICD-10-CM | POA: Insufficient documentation

## 2017-04-17 ENCOUNTER — Ambulatory Visit (INDEPENDENT_AMBULATORY_CARE_PROVIDER_SITE_OTHER): Payer: BLUE CROSS/BLUE SHIELD | Admitting: Internal Medicine

## 2017-04-17 ENCOUNTER — Encounter: Payer: Self-pay | Admitting: Internal Medicine

## 2017-04-17 VITALS — BP 102/62 | HR 78 | Ht 72.0 in | Wt 289.0 lb

## 2017-04-17 DIAGNOSIS — I428 Other cardiomyopathies: Secondary | ICD-10-CM | POA: Diagnosis not present

## 2017-04-17 DIAGNOSIS — I5032 Chronic diastolic (congestive) heart failure: Secondary | ICD-10-CM | POA: Diagnosis not present

## 2017-04-17 DIAGNOSIS — I2510 Atherosclerotic heart disease of native coronary artery without angina pectoris: Secondary | ICD-10-CM | POA: Diagnosis not present

## 2017-04-17 DIAGNOSIS — I340 Nonrheumatic mitral (valve) insufficiency: Secondary | ICD-10-CM | POA: Diagnosis not present

## 2017-04-17 DIAGNOSIS — N183 Chronic kidney disease, stage 3 unspecified: Secondary | ICD-10-CM

## 2017-04-17 DIAGNOSIS — Z79899 Other long term (current) drug therapy: Secondary | ICD-10-CM | POA: Diagnosis not present

## 2017-04-17 DIAGNOSIS — I480 Paroxysmal atrial fibrillation: Secondary | ICD-10-CM | POA: Diagnosis not present

## 2017-04-17 MED ORDER — APIXABAN 5 MG PO TABS: 5.0000 mg | ORAL_TABLET | Freq: Two times a day (BID) | ORAL | 2 refills | Status: DC

## 2017-04-17 NOTE — Progress Notes (Signed)
Follow-up Outpatient Visit Date: 04/17/2017  Primary Care Provider: Vidal Schwalbe, MD [address] [place] [address]  Chief Complaint: Follow-up atrial fibrillation and diastolic heart failure  HPI:  [name] is a 66 y.o. year-old male with history of chronic systolic and diastolic heart failure (LVEF 55-60% in 11/2016), a-fib, who presents for follow-up of heart failure. He was previously followed in our office by Dr. Yvone Neu, having last been seen in May. He has continued to do well since that time. He denies chest pain, palpitations, and edema. He has been using CPAP regularly and has noticed improvement in he breathing. [name] compliant with his medications and regularly follows with his PCP, Dr. Bartolo Darter. He does not exercise regularly and notes that his weight is up due to dietary non-compliance and lack of exercise.  --------------------------------------------------------------------------------------------------  Past Medical History:  Diagnosis Date  . Cardiomyopathy ([place])   . Chronic combined systolic (congestive) and diastolic (congestive) heart failure (HCC)    a. EF 25% by cath in 2013 b. Echo in 07/2015 showing EF of 15-20%, moderate MR, moderate Pulm HTN, severely dilated IVC  . CKD (chronic kidney disease) stage 3, GFR 30-59 ml/min (HCC)   . Coronary artery disease   . Diabetes mellitus type 2, uncontrolled ([place])    a. A1c 11.0 in 07/2015.  [name] Hypertension   . Kidney stones    Left  . New onset atrial fibrillation ([place])    a. diagnosed in 07/2015 b. started on Eliquis  . Paroxysmal atrial fibrillation (HCC)   . Pollen allergies 09-21-15   pt called and stated that he woke up and had some drainage-pt states he did not see the color of the drainage-pt denies running a fever and this only happened once-pt instructed to call Dr Audree Bane office if he starts running a fever or if the color of drainage becomes yellow/green  . Psoriasis   . Pulmonary hypertension ([place])     . Renal disorder    kidney stone  . Sleep apnea    Past Surgical History:  Procedure Laterality Date  . CARDIAC CATHETERIZATION N/A 07/25/2015   Procedure: Right/Left Heart Cath and Coronary Angiography;  Surgeon: Wellington Hampshire, MD;  Location: [facility];  Service: Cardiovascular;  Laterality: N/A;  . CARDIAC [place]     [place]  . CARDIAC CATHETERIZATION     [facility], [place]    . CYSTOSCOPY WITH STENT PLACEMENT Left 09/07/2015   Procedure: CYSTOSCOPY WITH STENT PLACEMENT;  Surgeon: Hollice Espy, MD;  Location: [facility];  Service: Urology;  Laterality: Left;  . CYSTOSCOPY/URETEROSCOPY/HOLMIUM LASER/STENT PLACEMENT Left 10/25/2015   Procedure: CYSTOSCOPY/URETEROSCOPY/HOLMIUM LASER/STENT EXCHANGE;  Surgeon: Hollice Espy, MD;  Location: [facility];  Service: Urology;  Laterality: Left;  . KIDNEY SURGERY    . NEPHROSTOMY TUBE PLACEMENT ([place] H[facility]) Left   . URETEROSCOPY WITH HOLMIUM LASER LITHOTRIPSY Left 09/07/2015   Procedure: URETEROSCOPY WITH HOLMIUM LASER LITHOTRIPSY;  Surgeon: Hollice Espy, MD;  Location: [facility];  Service: Urology;  Laterality: Left;    Current Meds  Medication Sig  . amiodarone (PACERONE) 200 MG tablet Take 1 tablet (200 mg total) by mouth daily. (Patient taking differently: Take 200 mg by mouth every morning. )  . atorvastatin (LIPITOR) 40 MG tablet Take 1 tablet (40 mg total) by mouth daily at 6 PM.  . b complex vitamins tablet Take 1 tablet by mouth daily.  . baclofen (LIORESAL) 10 MG tablet Take  10 mg by mouth daily.  . carvedilol (COREG) 6.25 MG tablet TAKE 1 TABLET BY MOUTH TWICE DAILY  . cholecalciferol (VITAMIN D) 1000 units tablet Take 2,000 Units by mouth daily.  . clonazePAM (KLONOPIN) 1 MG tablet   . Coenzyme Q10 (CO Q 10 PO) Take 400 mg by mouth daily.  . Cranberry 500 MG CAPS Take 1,000 mg by mouth daily.  [name] ELIQUIS 5 MG TABS tablet TAKE 1 TABLET BY MOUTH TWICE DAILY  .  fenofibrate (TRICOR) 145 MG tablet   . furosemide (LASIX) 80 MG tablet Take 1 tablet (80 mg total) by mouth 2 (two) times daily.  [name] glipiZIDE (GLUCOTROL) 10 MG tablet Take 10 mg by mouth 2 (two) times daily.   [name] Hawthorne Berry 550 MG CAPS Take 550 mg by mouth daily.  [name] liraglutide (VICTOZA) 18 MG/3ML SOPN Inject 1.8 mg into the skin daily.  [name] losartan (COZAAR) 25 MG tablet TAKE 1 TABLET BY MOUTH ONCE DAILY.  . metFORMIN (GLUCOPHAGE) 1000 MG tablet 1,000 mg 2 (two) times daily with a meal.   . Multiple Vitamin (MULTI-VITAMINS) TABS Take by mouth.  . multivitamin-lutein (OCUVITE-LUTEIN) CAPS capsule Take 1 capsule by mouth 2 (two) times daily.  [name] OVER THE COUNTER MEDICATION Take 3 capsules by mouth 2 (two) times daily. Reported on 12/23/2015  . OVER THE COUNTER MEDICATION Take 1 capsule by mouth daily. Pt takes caprylic acid.  . polyethylene glycol (MIRALAX / GLYCOLAX) packet Take 17 g by mouth daily as needed for mild constipation.  [name] spironolactone (ALDACTONE) 25 MG tablet Take 1 tablet (25 mg total) by mouth daily.  . TURMERIC CURCUMIN PO Take 1 capsule by mouth daily.  . vitamin C (ASCORBIC ACID) 500 MG tablet Take 1,000 mg by mouth daily.  . vitamin E 400 UNIT capsule Take 400 Units by mouth daily.    Allergies: Patient has no known allergies.  Social History   Socioeconomic History  . Marital status: Divorced    Spouse name: Not on file  . Number of children: Not on file  . Years of education: Not on file  . Highest education level: Not on file  Social Needs  . Financial resource strain: Not on file  . Food insecurity - worry: Not on file  . Food insecurity - inability: Not on file  . Transportation needs - medical: Not on file  . Transportation needs - non-medical: Not on file  Occupational History  . Not on file  Tobacco Use  . Smoking status: Never Smoker  . Smokeless tobacco: Never Used  Substance and Sexual Activity  . Alcohol use: Yes    Alcohol/week: 0.0 oz     Comment: occasionally  . Drug use: No    Comment: Past  . Sexual activity: Not on file  Other Topics Concern  . Not on file  Social History Narrative  . Not on file    Family History  Problem Relation Age of Onset  . Heart failure Father   . Heart attack Brother   . Kidney cancer Neg Hx   . Bladder Cancer Neg Hx   . Prostate cancer Neg Hx     Review of Systems: A 12-system review of systems was performed and was negative except as noted in the HPI.  --------------------------------------------------------------------------------------------------  Physical Exam: BP 102/62 (BP Location: Left Arm, Patient Position: Sitting, Cuff Size: Normal)   Pulse 78   Ht 6' (1.829 m)   Wt 289 lb (131.1 kg)   BMI 39.20  kg/m   General:  Obese man, seated comfortably in the exam room HEENT: No conjunctival pallor or scleral icterus. Moist mucous membranes.  OP clear. Neck: Supple without lymphadenopathy or thyromegaly. JVP difficult to assess due to body habitus and overlying facial hair. Lungs: Normal work of breathing. Clear to auscultation bilaterally without wheezes or crackles. Heart: Regular rate and rhythm without murmurs, rubs, or gallops. Unable to assess PMI due to body habitus. Abd: Bowel sounds present. Soft, NT/ND. Unable to assess HSM due to body habitus. Ext: Trace ankle edema. Skin: Warm and dry without rash.  EKG:  NSR with left axis deviation and non-specific IVCD. Poor R-wave progression. No significant change since 10/10/16.  Lab Results  Component Value Date   WBC 9.1 08/31/2015   HGB 13.3 10/25/2015   HCT 39.0 10/25/2015   MCV 93.3 08/31/2015   PLT 193 08/31/2015    Lab Results  Component Value Date   NA 139 11/22/2015   K 5.7 (H) 11/22/2015   CL 94 (L) 11/22/2015   CO2 26 11/22/2015   BUN 39 (H) 11/22/2015   CREATININE 1.37 (H) 11/22/2015   GLUCOSE 259 (H) 11/22/2015   ALT 25 11/22/2015    Lab Results  Component Value Date   CHOL 153 07/09/2015    HDL 34 (L) 07/09/2015   LDLCALC 85 07/09/2015   TRIG 168 (H) 07/09/2015   CHOLHDL 4.5 07/09/2015    --------------------------------------------------------------------------------------------------  ASSESSMENT AND PLAN: Chronic diastolic heart failure with history of NICM and normalization of LVEF Symptoms stable to improved, which Mr. Garfield attributes to CPAP use. His weight continues to climb, though much of this is likely due to lack of exercise and poor diet. We discussed importance of weight loss and activity. He does not appear significantly volume overloaded today. We will continue his current medications, including carvedilol and losartan.  Paroxysmal atrial fibrillation EKG today shows normal sinus rhythm. Mr. Petroni is tolerating apxiaban and amiodarone well. His PCP has been monitoring LFTs and TFTs' repeat studies are due next month. We will arrange for repeat PFTs in 11/2016.  Coronary artery disease without angina Moderate 2-vessel CAD noted by cath in 07/2015. Mr. Rockwell denies symptoms to suggest worsening coronary insuffieincy. We will continue with low-dose aspirin and statin therapy to prevent progression of disease.  Mitral regurgitation Most recent echo in 11/2016 showed normal LVEF with no significant MR. Continue medical therapy.  Chronic kidney disease Most recent labs by Dr. Bartolo Darter were notable for creatinine of 2.0, up slightly from prior assays. This well need to be monitored closely, given some of his medications like metformin and losartan. Mr. Buffalo reports that he is due for labs next month with Dr. Bartolo Darter. I will defer further management to him.  Follow-up: Return to clinic in 6 months.  Nelva Bush, MD 04/17/2017 12:09 PM

## 2017-04-17 NOTE — Patient Instructions (Signed)
Medication Instructions:  Your physician recommends that you continue on your current medications as directed. Please refer to the Current Medication list given to you today.   Labwork: none  Testing/Procedures: Your physician has recommended that you have a pulmonary function test. Pulmonary Function Tests are a group of tests that measure how well air moves in and out of your lungs.   You are scheduled for ________________ , arrival time _____________ to the [facility]. Please follow instructions as listed on the Patient Instruction sheet provided.    Follow-Up: Your physician wants you to follow-up in: 6 MONTHS WITH DR END AFTER HAVING PULMONARY FUNCTION TESTING. You will receive a reminder letter in the mail two months in advance. If you don't receive a letter, please call our office to schedule the follow-up appointment.  If you need a refill on your cardiac medications before your next appointment, please call your pharmacy.    Pulmonary Function Tests Pulmonary function tests (PFTs) are used to measure how well your lungs work, find out what is causing your lung problems, and figure out the best treatment for you. You may have PFTs:  When you have an illness involving the lungs.  To follow changes in your lung function over time if you have a chronic lung disease.  If you are an Nature conservation officer. This checks the effects of being exposed to chemicals over a long period of time.  To check lung function before having surgery or other procedures.  To check your lungs if you smoke.  To check if prescribed medicines or treatments are helping your lungs.  Your results will be compared to the expected lung function of someone with healthy lungs who is similar to you in:  Age.  Gender.  Height.  Weight.  Race or ethnicity.  This is done to show how your lungs compare to normal lung function (percent predicted). This is how your health care provider knows  if your lung function is normal or not. If you have had PFTs done before, your health care provider will compare your current results with past results. This shows if your lung function is better, worse, or the same as before. Tell a health care provider about:  Any allergies you have.  All medicines you are taking, including inhaler or nebulizer medicines, vitamins, herbs, eye drops, creams, and over-the-counter medicines.  Any blood disorders you have.  Any surgeries you have had, especially recent eye surgery, abdominal surgery, or chest surgery. These can make PFTs difficult or unsafe.  Any medical conditions you have, including chest pain or heart problems, tuberculosis, or respiratory infections such as pneumonia, a cold, or the flu.  Any fear of being in closed spaces (claustrophobia). Some of your tests may be in a closed space. What are the risks? Generally, this is a safe procedure. However, problems may occur, including:  Light-headedness due to over-breathing (hyperventilation).  An asthma attack from deep breathing.  A collapsed lung.  What happens before the procedure?  Take over-the-counter and prescription medicines only as told by your health care provider. If you take inhaler or nebulizer medicines, ask your health care provider which medicines you should take on the day of your testing. Some inhaler medicines may interfere with PFTs if they are taken shortly before the tests.  Follow your health care provider's instructions on eating and drinking restrictions. This may include avoiding eating large meals and drinking alcohol before the testing.  Do not use any products that contain  nicotine or tobacco, such as cigarettes and e-cigarettes. If you need help quitting, ask your health care provider.  Wear comfortable clothing that will not interfere with breathing. What happens during the procedure?  You will be given a soft nose clip to wear. This is done so all of  your breaths will go through your mouth instead of your nose.  You will be given a germ-free (sterile) mouthpiece. It will be attached to a machine that measures your breathing (spirometer).  You will be asked to do various breathing maneuvers. The maneuvers will be done by breathing in (inhaling) and breathing out (exhaling). You may be asked to repeat the maneuvers several times before the testing is done.  It is important to follow the instructions exactly to get accurate results. Make sure to blow as hard and as fast as you can when you are told to do so.  You may be given a medicine that makes the small air passages in your lungs larger (bronchodilator) after testing has been done. This medicine will make it easier for you to breathe.  The tests will be repeated after the bronchodilator has taken effect.  You will be monitored carefully during the procedure for faintness, dizziness, trouble breathing, or any other problems. The procedure may vary among health care providers and hospitals. What happens after the procedure?  It is up to you to get your test results. Ask your health care provider, or the department that is doing the tests, when your results will be ready. After you have received your test results, talk with your health care provider about treatment options, if necessary. Summary  Pulmonary function tests (PFTs) are used to measure how well your lungs work, find out what is causing your lung problems, and figure out the best treatment for you.  Wear comfortable clothing that will not interfere with breathing.  It is up to you to get your test results. After you have received them, talk with your health care provider about treatment options, if necessary. This information is not intended to replace advice given to you by your health care provider. Make sure you discuss any questions you have with your health care provider. Document Released: 01/12/2004 Document Revised:  04/12/2016 Document Reviewed: 04/12/2016 Elsevier Interactive Patient Education  2017 [employer].

## 2017-04-18 ENCOUNTER — Encounter: Payer: Self-pay | Admitting: Internal Medicine

## 2017-04-18 DIAGNOSIS — I340 Nonrheumatic mitral (valve) insufficiency: Secondary | ICD-10-CM | POA: Insufficient documentation

## 2017-04-18 DIAGNOSIS — N183 Chronic kidney disease, stage 3 (moderate): Secondary | ICD-10-CM

## 2017-04-18 DIAGNOSIS — N1832 Chronic kidney disease, stage 3b: Secondary | ICD-10-CM | POA: Insufficient documentation

## 2017-04-18 DIAGNOSIS — I428 Other cardiomyopathies: Secondary | ICD-10-CM | POA: Insufficient documentation

## 2017-05-23 ENCOUNTER — Ambulatory Visit: Payer: BLUE CROSS/BLUE SHIELD

## 2017-05-23 ENCOUNTER — Ambulatory Visit: Payer: BLUE CROSS/BLUE SHIELD | Attending: Internal Medicine

## 2017-05-23 DIAGNOSIS — I5032 Chronic diastolic (congestive) heart failure: Secondary | ICD-10-CM | POA: Diagnosis not present

## 2017-05-23 DIAGNOSIS — Z79899 Other long term (current) drug therapy: Secondary | ICD-10-CM

## 2017-05-23 DIAGNOSIS — Z7901 Long term (current) use of anticoagulants: Secondary | ICD-10-CM | POA: Diagnosis not present

## 2017-05-23 DIAGNOSIS — I130 Hypertensive heart and chronic kidney disease with heart failure and stage 1 through stage 4 chronic kidney disease, or unspecified chronic kidney disease: Secondary | ICD-10-CM | POA: Insufficient documentation

## 2017-05-23 DIAGNOSIS — I480 Paroxysmal atrial fibrillation: Secondary | ICD-10-CM | POA: Insufficient documentation

## 2017-05-23 DIAGNOSIS — N183 Chronic kidney disease, stage 3 (moderate): Secondary | ICD-10-CM | POA: Insufficient documentation

## 2017-05-27 ENCOUNTER — Other Ambulatory Visit: Payer: Self-pay | Admitting: Cardiovascular Disease

## 2017-07-03 ENCOUNTER — Other Ambulatory Visit: Payer: Self-pay

## 2017-07-26 ENCOUNTER — Other Ambulatory Visit: Payer: Self-pay | Admitting: Cardiovascular Disease

## 2017-10-30 ENCOUNTER — Ambulatory Visit: Payer: BLUE CROSS/BLUE SHIELD | Admitting: Internal Medicine

## 2017-11-16 ENCOUNTER — Other Ambulatory Visit: Payer: Self-pay | Admitting: Nurse Practitioner

## 2017-12-04 ENCOUNTER — Other Ambulatory Visit: Payer: Self-pay

## 2017-12-04 ENCOUNTER — Ambulatory Visit (INDEPENDENT_AMBULATORY_CARE_PROVIDER_SITE_OTHER): Payer: Medicare Other | Admitting: Internal Medicine

## 2017-12-04 ENCOUNTER — Encounter: Payer: Self-pay | Admitting: Internal Medicine

## 2017-12-04 VITALS — BP 106/72 | HR 76 | Ht 72.0 in | Wt 283.0 lb

## 2017-12-04 DIAGNOSIS — E1159 Type 2 diabetes mellitus with other circulatory complications: Secondary | ICD-10-CM

## 2017-12-04 DIAGNOSIS — Z79899 Other long term (current) drug therapy: Secondary | ICD-10-CM

## 2017-12-04 DIAGNOSIS — I480 Paroxysmal atrial fibrillation: Secondary | ICD-10-CM

## 2017-12-04 DIAGNOSIS — I10 Essential (primary) hypertension: Secondary | ICD-10-CM

## 2017-12-04 DIAGNOSIS — I5032 Chronic diastolic (congestive) heart failure: Secondary | ICD-10-CM | POA: Diagnosis not present

## 2017-12-04 DIAGNOSIS — N183 Chronic kidney disease, stage 3 unspecified: Secondary | ICD-10-CM

## 2017-12-04 DIAGNOSIS — I2510 Atherosclerotic heart disease of native coronary artery without angina pectoris: Secondary | ICD-10-CM

## 2017-12-04 DIAGNOSIS — E785 Hyperlipidemia, unspecified: Secondary | ICD-10-CM | POA: Diagnosis not present

## 2017-12-04 NOTE — Progress Notes (Signed)
Follow-up Outpatient Visit Date: 12/04/2017  Primary Care Provider: Vidal Schwalbe, MD [address] [place] [address]  Chief Complaint: Follow-up diastolic heart failure, coronary artery disease, and paroxysmal atrial fibrilation  HPI:  [name] is a 67 y.o. year-old male with history of chronic systolic and diastolic heart failure (LVEF 55-60% in 11/2016),  coronary artery disease status post PCI to the proximal LAD, paroxysmal atrial fibrillation, chronic kidney disease stage III, hypertension, and type 2 diabetes mellitus, who presents for follow-up of atrial fibrillation and diastolic heart failure I last saw him in November, at which time he was doing well.  He reported improved breathing with regular use of CPAP.  Today, [name] reports feeling about the same as at our last visit.  He has stable dyspnea on exertion.  He denies chest pain, palpitations, lightheadedness, and edema.  His weight has been stable.  He does not exercise and notes that his blood sugar has been suboptimally controlled.  He has noted intermittent mucous in his urine over the last few days and also believes that he passes a small stone in his urine this morning.  He denies flank pain.  [name] using CPAP regularly.  --------------------------------------------------------------------------------------------------  Past Medical History:  Diagnosis Date  . Cardiomyopathy ([place])   . Chronic combined systolic (congestive) and diastolic (congestive) heart failure (HCC)    a. EF 25% by cath in 2013 b. Echo in 07/2015 showing EF of 15-20%, moderate MR, moderate Pulm HTN, severely dilated IVC  . CKD (chronic kidney disease) stage 3, GFR 30-59 ml/min (HCC)   . Coronary artery disease   . Diabetes mellitus type 2, uncontrolled ([place])    a. A1c 11.0 in 07/2015.  [name] Hypertension   . Kidney stones    Left  . New onset atrial fibrillation ([place])    a. diagnosed in 07/2015 b. started on Eliquis  . Paroxysmal atrial  fibrillation (HCC)   . Pollen allergies 09-21-15   pt called and stated that he woke up and had some drainage-pt states he did not see the color of the drainage-pt denies running a fever and this only happened once-pt instructed to call Dr Audree Bane office if he starts running a fever or if the color of drainage becomes yellow/green  . Psoriasis   . Pulmonary hypertension ([place])   . Renal disorder    kidney stone  . Sleep apnea    Past Surgical History:  Procedure Laterality Date  . CARDIAC CATHETERIZATION N/A 07/25/2015   Procedure: Right/Left Heart Cath and Coronary Angiography;  Surgeon: Wellington Hampshire, MD;  Location: [facility];  Service: Cardiovascular;  Laterality: N/A;  . CARDIAC [place]IZ[place]     [place]  . CARDIAC CATHETERIZATION     [facility] [place]    . CYSTOSCOPY WITH STENT PLACEMENT Left 09/07/2015   Procedure: CYSTOSCOPY WITH STENT PLACEMENT;  Surgeon: Hollice Espy, MD;  Location: [facility];  Service: Urology;  Laterality: Left;  . CYSTOSCOPY/URETEROSCOPY/HOLMIUM LASER/STENT PLACEMENT Left 10/25/2015   Procedure: CYSTOSCOPY/URETEROSCOPY/HOLMIUM LASER/STENT EXCHANGE;  Surgeon: Hollice Espy, MD;  Location: [facility];  Service: Urology;  Laterality: Left;  . KIDNEY SURGERY    . NEPHROSTOMY TUBE PLACEMENT ([place] HX) Left   . URETEROSCOPY WITH HOLMIUM LASER LITHOTRIPSY Left 09/07/2015   Procedure: URETEROSCOPY WITH HOLMIUM LASER LITHOTRIPSY;  Surgeon: Hollice Espy, MD;  Location: [facility];  Service: Urology;  Laterality: Left;   Current Meds  Medication Sig  .  amiodarone (PACERONE) 200 MG tablet Take 1 tablet (200 mg total) by mouth daily. (Patient taking differently: Take 200 mg by mouth every morning. )  . apixaban (ELIQUIS) 5 MG TABS tablet Take 1 tablet (5 mg total) 2 (two) times daily by mouth.  [name] atorvastatin (LIPITOR) 40 MG tablet Take 1 tablet (40 mg total) by mouth daily at 6 PM.  . b complex vitamins  tablet Take 1 tablet by mouth daily.  . baclofen (LIORESAL) 10 MG tablet Take 10 mg by mouth daily.  . carvedilol (COREG) 6.25 MG tablet TAKE 1 TABLET BY MOUTH TWICE DAILY  . cholecalciferol (VITAMIN D) 1000 units tablet Take 2,000 Units by mouth daily.  . clonazePAM (KLONOPIN) 1 MG tablet   . Coenzyme Q10 (CO Q 10 PO) Take 400 mg by mouth daily.  . Cranberry 500 MG CAPS Take 1,000 mg by mouth daily.  . fenofibrate (TRICOR) 145 MG tablet   . furosemide (LASIX) 80 MG tablet Take 1 tablet (80 mg total) by mouth 2 (two) times daily.  [name] glipiZIDE (GLUCOTROL) 10 MG tablet Take 10 mg by mouth 2 (two) times daily.   [name] Hawthorne Berry 550 MG CAPS Take 550 mg by mouth daily.  [name] liraglutide (VICTOZA) 18 MG/3ML SOPN Inject 1.8 mg into the skin daily.  [name] losartan (COZAAR) 25 MG tablet TAKE 1 TABLET BY MOUTH ONCE DAILY.  . metFORMIN (GLUCOPHAGE) 1000 MG tablet 1,000 mg 2 (two) times daily with a meal.   . Multiple Vitamin (MULTI-VITAMINS) TABS Take by mouth.  . multivitamin-lutein (OCUVITE-LUTEIN) CAPS capsule Take 1 capsule by mouth 2 (two) times daily.  [name] OVER THE COUNTER MEDICATION Take 3 capsules by mouth 2 (two) times daily. Reported on 12/23/2015  . OVER THE COUNTER MEDICATION Take 1 capsule by mouth daily. Pt takes caprylic acid.  . polyethylene glycol (MIRALAX / GLYCOLAX) packet Take 17 g by mouth daily as needed for mild constipation.  . potassium phosphate, monobasic, (K-PHOS ORIGINAL) 500 MG tablet Take 1 tablet (500 mg total) by mouth daily.  [name] spironolactone (ALDACTONE) 25 MG tablet Take 1 tablet (25 mg total) by mouth daily.  . TURMERIC CURCUMIN PO Take 1 capsule by mouth daily.  . vitamin C (ASCORBIC ACID) 500 MG tablet Take 1,000 mg by mouth daily.  . vitamin E 400 UNIT capsule Take 400 Units by mouth daily.    Allergies: Patient has no known allergies.  Social History   Tobacco Use  . Smoking status: Never Smoker  . Smokeless tobacco: Never Used  Substance Use Topics  . Alcohol  use: Yes    Alcohol/week: 0.0 oz    Comment: occasionally  . Drug use: No    Types: Marijuana, Cocaine, Opium, LSD    Comment: Past    Family History  Problem Relation Age of Onset  . Heart failure Father   . Heart attack Brother   . Kidney cancer Neg Hx   . Bladder Cancer Neg Hx   . Prostate cancer Neg Hx     Review of Systems: A 12-system review of systems was performed and was negative except as noted in the HPI.  --------------------------------------------------------------------------------------------------  Physical Exam: BP 106/72 (BP Location: Left Arm, Patient Position: Sitting, Cuff Size: Normal)   Pulse 76   Ht 6' (1.829 m)   Wt 283 lb (128.4 kg)   BMI 38.38 kg/m   General:  NAD. HEENT: No conjunctival pallor or scleral icterus. Moist mucous membranes.  OP clear. Neck: Supple without lymphadenopathy, thyromegaly,  JVD, or HJR. Lungs: Normal work of breathing. Clear to auscultation bilaterally without wheezes or crackles. Heart: Distant heart sounds.Regular rate and rhythm without murmurs, rubs, or gallops. Unable to assess PMI due to body habitus. Abd: Bowel sounds present. Soft, NT/ND.  Unable to assess HSM due to body habitus. Ext: Trace pretibial edema bilaterally. Skin: Warm and dry without rash.  EKG:  NSR with non-specific intraventricular conduction delay, left axis deviation, and poor R-wave progression.  Lab Results  Component Value Date   WBC 9.1 08/31/2015   HGB 13.3 10/25/2015   HCT 39.0 10/25/2015   MCV 93.3 08/31/2015   PLT 193 08/31/2015    Lab Results  Component Value Date   NA 139 11/22/2015   K 5.7 (H) 11/22/2015   CL 94 (L) 11/22/2015   CO2 26 11/22/2015   BUN 39 (H) 11/22/2015   CREATININE 1.37 (H) 11/22/2015   GLUCOSE 259 (H) 11/22/2015   ALT 25 11/22/2015    Lab Results  Component Value Date   CHOL 153 07/09/2015   HDL 34 (L) 07/09/2015   LDLCALC 85 07/09/2015   TRIG 168 (H) 07/09/2015   CHOLHDL 4.5 07/09/2015    Outside labs (11/28/17, Dr. Bartolo Darter): Na 137, K 4.5, Cl 98, BUN 52, creatinine 2.65, glucose 225, Ca 7.3, AST 18, ALT 18, ALP 30, Tbili 0.5, Tprot7.3, albumin 4.4  WBC 6.1, HGB 12.7, HCT 36.0, PLT 188  Hemoglobin A1c 8.4%  --------------------------------------------------------------------------------------------------  ASSESSMENT AND PLAN: Coronary artery disease No angina.  Stable DOE that is likely multifactorial.  We will defer ischemia testing and continue with aggressive secondary prevention.  HFpEF Trace edema noted on exam, which is chronic.  DOE is stable, consistent with NYHA class II HF.  Continue current medications.  Weight loss and sodium restriction encouraged.  Paroxysmal atrial fibrillation No symptoms to suggest recurrence of a-fib.  EKG today shows NSR.  We will continue indefinite anticoagulation with apixaban 5 mg BID, as well as carvedilol 6.25 mg BID and amiodarone (currently taking 200 mg every other day).  Recent LFT's were normal.  We will recheck a TSH and also arrange for PFT's including DLCO at the patient's convenience.  Chronic kidney disease Labs obtained last week by Dr. Bartolo Darter noted for significant bump in BUN and creatinine over the last 6 months (now 52 and 2.7, respectively).  Baseline creatinine is around 1.8.  Given history of multiple kidney stones and recent mucous and possible stone in urine, I have advised [name] to contact Dr. Erlene Quan for further evaluation.  I have recommended repeat BMP to confirm bump in creatinine.  If it is indeed elevated above baseline, we would need to consider stopping losartan, spironolactone, and metformin.  Nephrology consultation would also be advisable.  Type 2 diabetes mellitus Uncontrolled.  Continue current medications under the direction of Dr. Bartolo Darter, though metformin may need to be discontinued in the setting of renal insufficiency.  Hypertension Blood pressure low normal.  Continue medications, as above,  pending repeat BMP.  Hyperlipidemia Goal LDL < 70.  Most recent LDL in our system from 2017 was not at target.  Continue atorvastatin and fenofibrate, per Dr. Bartolo Darter.  Nelva Bush, MD 12/04/2017 4:14 PM

## 2017-12-04 NOTE — Patient Instructions (Signed)
Medication Instructions: Your physician recommends that you continue on your current medications as directed. Please refer to the Current Medication list given to you today.  If you need a refill on your cardiac medications before your next appointment, please call your pharmacy.   Labwork: Your provider would like for you to have the following labs today: BMET and TSH  Follow-Up: Your physician wants you to follow-up in 6 months with Dr. Saunders Revel. You will receive a reminder letter in the mail two months in advance. If you don't receive a letter, please call our office at [phone] to schedule this follow-up appointment.  Thank you for choosing [facility]!

## 2017-12-05 ENCOUNTER — Encounter: Payer: Self-pay | Admitting: Internal Medicine

## 2017-12-05 DIAGNOSIS — E119 Type 2 diabetes mellitus without complications: Secondary | ICD-10-CM | POA: Insufficient documentation

## 2017-12-05 DIAGNOSIS — I5032 Chronic diastolic (congestive) heart failure: Secondary | ICD-10-CM | POA: Insufficient documentation

## 2017-12-06 ENCOUNTER — Telehealth: Payer: Self-pay | Admitting: Internal Medicine

## 2017-12-06 NOTE — Progress Notes (Signed)
I called and spoke with the patient today. He states he did not feel like going late on Wednesday to get his labs done. [name] going to try to go the the lab in [place] today as this is closer to his home and have this done. I encourage him to please do this ASAP per Dr. Saunders Revel. [name] also aware that he needs PFT's scheduled. [name] agreeable, but would like scheduling to call him directly to arrange a date. I advised I will see if they will call him directly- order placed for PFT's.

## 2017-12-06 NOTE — Addendum Note (Signed)
Addended by: Alvis Lemmings C on: 12/06/2017 09:35 AM   Modules accepted: Orders

## 2017-12-06 NOTE — Telephone Encounter (Signed)
I called to follow up with the patient today at the request of Dr. Saunders Revel. Dr. Saunders Revel wanted to confirm if the patient had gone for lab work yet and to set him up for PFT's  I called and spoke with the patient today. He states he did not feel like going late on Wednesday to get his labs done. [name] going to try to go the the lab in [place] today as this is closer to his home and have this done. I encourage him to please do this ASAP per Dr. Saunders Revel. [name] also aware that he needs PFT's scheduled. [name] agreeable, but would like scheduling to call him directly to arrange a date. I advised I will see if they will call him directly- order placed for PFT's.

## 2017-12-06 NOTE — Telephone Encounter (Signed)
I left a message for scheduling at [facility] [phone] to please call the patient to scheduled his PFT's.

## 2017-12-06 NOTE — Telephone Encounter (Signed)
I reviewed the patient's chart. He has been scheduled for PFT's to be done on 12/12/17. Scheduling has called and spoken with the patient. Instruction letter for PFT's mailed to the patient.

## 2017-12-12 ENCOUNTER — Ambulatory Visit (HOSPITAL_COMMUNITY): Payer: Medicare Other

## 2017-12-12 ENCOUNTER — Other Ambulatory Visit
Admission: RE | Admit: 2017-12-12 | Discharge: 2017-12-12 | Disposition: A | Discharge status: Home / Self Care | Payer: Medicare Other | Source: Ambulatory Visit | Attending: Internal Medicine | Admitting: Internal Medicine

## 2017-12-12 DIAGNOSIS — I480 Paroxysmal atrial fibrillation: Secondary | ICD-10-CM | POA: Diagnosis not present

## 2017-12-12 DIAGNOSIS — I10 Essential (primary) hypertension: Secondary | ICD-10-CM | POA: Diagnosis present

## 2017-12-12 DIAGNOSIS — Z79899 Other long term (current) drug therapy: Secondary | ICD-10-CM

## 2017-12-12 DIAGNOSIS — I5032 Chronic diastolic (congestive) heart failure: Secondary | ICD-10-CM | POA: Insufficient documentation

## 2017-12-12 LAB — TSH: TSH: 1.83 u[IU]/mL (ref 0.350–4.500)

## 2017-12-12 LAB — BASIC METABOLIC PANEL
Anion gap: 14 (ref 5–15)
BUN: 46 mg/dL — ABNORMAL HIGH (ref 8–23)
CO2: 29 mmol/L (ref 22–32)
Calcium: 9.8 mg/dL (ref 8.9–10.3)
Chloride: 95 mmol/L — ABNORMAL LOW (ref 98–111)
Creatinine, Ser: 2.55 mg/dL — ABNORMAL HIGH (ref 0.61–1.24)
GFR calc Af Amer: 29 mL/min — ABNORMAL LOW (ref >60)
GFR calc non Af Amer: 25 mL/min — ABNORMAL LOW (ref >60)
Glucose, Bld: 254 mg/dL — ABNORMAL HIGH (ref 70–99)
Potassium: 4.2 mmol/L (ref 3.5–5.1)
Sodium: 138 mmol/L (ref 135–145)

## 2017-12-12 MED ORDER — ALBUTEROL SULFATE (2.5 MG/3ML) 0.083% IN NEBU
2.5000 mg | INHALATION_SOLUTION | Freq: Once | RESPIRATORY_TRACT | Status: AC
  Administered 2017-12-12: 2.5 mg via RESPIRATORY_TRACT
  Filled 2017-12-12: qty 3

## 2017-12-13 ENCOUNTER — Encounter: Payer: Self-pay | Admitting: Urology

## 2017-12-13 ENCOUNTER — Encounter: Payer: Self-pay | Admitting: Internal Medicine

## 2017-12-13 ENCOUNTER — Telehealth: Payer: Self-pay | Admitting: Urology

## 2017-12-13 DIAGNOSIS — N200 Calculus of kidney: Secondary | ICD-10-CM

## 2017-12-13 DIAGNOSIS — N183 Chronic kidney disease, stage 3 unspecified: Secondary | ICD-10-CM

## 2017-12-13 NOTE — Telephone Encounter (Signed)
Pt called office wanting to speak with Dr. Erlene Quan directly, pt proceeded to explain that his cardiologist is have a "fit" about his creatinine levels going up and highly suggests pt see a Nephrologist, second Dr to mention seeing a nephrologist/specialist, pt doesn't think he should see another specialist, pt states that he has high confidence in Dr. Erlene Quan and wants Dr. Erlene Quan to address the creatinine levels. And if she thinks he should see a Nephrologist, he asks that she refer him to one. Labs have been dropped in pt chart and routed to Dr. Erlene Quan. Please advise pt at @ [phone].

## 2017-12-13 NOTE — Telephone Encounter (Signed)
Current creatinine 2.55mg /dL please see message below and advise. Thanks

## 2017-12-15 NOTE — Telephone Encounter (Signed)
I have reviewed the patient's labs.  It does appear that his creatinine is rising.  He has innumerable reasons why his creatinine may be elevated.  Agree with referral to nephrology.  Also like to order a renal ultrasound to assess whether or not he has any obstructing stones.  Order placed.  Please arrange.  Please let the patient know.  I will get in touch with him as soon as I see these results in my in basket.  Hollice Espy, MD

## 2017-12-17 ENCOUNTER — Other Ambulatory Visit: Payer: Self-pay | Admitting: *Deleted

## 2017-12-17 MED ORDER — FUROSEMIDE 80 MG PO TABS: 80.0000 mg | ORAL_TABLET | Freq: Every day | ORAL | 0 refills | Status: DC

## 2017-12-19 ENCOUNTER — Encounter: Payer: Self-pay | Admitting: Urology

## 2017-12-20 ENCOUNTER — Other Ambulatory Visit: Payer: Self-pay

## 2017-12-20 DIAGNOSIS — N183 Chronic kidney disease, stage 3 unspecified: Secondary | ICD-10-CM

## 2017-12-24 NOTE — Telephone Encounter (Signed)
Referral has been sent to [facility] and the patient has been notified of this. They will contact him with an appointment.  Sharyn Lull

## 2017-12-26 ENCOUNTER — Ambulatory Visit
Admission: RE | Admit: 2017-12-26 | Discharge: 2017-12-26 | Disposition: A | Discharge status: Home / Self Care | Payer: Medicare Other | Source: Ambulatory Visit | Attending: Urology | Admitting: Urology

## 2017-12-26 DIAGNOSIS — N183 Chronic kidney disease, stage 3 unspecified: Secondary | ICD-10-CM

## 2017-12-26 DIAGNOSIS — N200 Calculus of kidney: Secondary | ICD-10-CM | POA: Diagnosis not present

## 2017-12-27 ENCOUNTER — Telehealth: Payer: Self-pay

## 2017-12-27 NOTE — Telephone Encounter (Signed)
Dr. Erlene Quan,   Is this suppose to say no stones in right kidney, hydronephrosis, or swelling? Or should it say there is? I do not want to relay the wrong information to the pt. Please advise.

## 2017-12-27 NOTE — Telephone Encounter (Signed)
-----   Message from Hollice Espy, MD sent at 12/27/2017  9:35 AM EDT ----- Your ultrasound looks fine.  Stones in your right kidney hydronephrosis or swelling of either kidney.  I do not feel that your current renal dysfunction is related to stones or obstruction.   Hollice Espy, MD

## 2017-12-27 NOTE — Telephone Encounter (Signed)
Sorry, dictation issues.    Stable stones on the right.  No hydronephrosis or welling.    Hollice Espy, MD

## 2018-02-06 ENCOUNTER — Other Ambulatory Visit: Payer: Self-pay | Admitting: *Deleted

## 2018-02-21 ENCOUNTER — Encounter: Payer: Self-pay | Admitting: Urology

## 2018-02-21 ENCOUNTER — Ambulatory Visit
Admission: RE | Admit: 2018-02-21 | Discharge: 2018-02-21 | Disposition: A | Discharge status: Home / Self Care | Payer: Medicare Other | Source: Ambulatory Visit | Attending: Urology | Admitting: Urology

## 2018-02-21 ENCOUNTER — Ambulatory Visit (INDEPENDENT_AMBULATORY_CARE_PROVIDER_SITE_OTHER): Payer: Medicare Other | Admitting: Urology

## 2018-02-21 VITALS — BP 122/71 | HR 76 | Ht 72.0 in | Wt 282.0 lb

## 2018-02-21 DIAGNOSIS — N202 Calculus of kidney with calculus of ureter: Secondary | ICD-10-CM | POA: Diagnosis not present

## 2018-02-21 DIAGNOSIS — I2510 Atherosclerotic heart disease of native coronary artery without angina pectoris: Secondary | ICD-10-CM | POA: Diagnosis not present

## 2018-02-21 DIAGNOSIS — N183 Chronic kidney disease, stage 3 unspecified: Secondary | ICD-10-CM

## 2018-02-21 DIAGNOSIS — N261 Atrophy of kidney (terminal): Secondary | ICD-10-CM

## 2018-02-21 DIAGNOSIS — N200 Calculus of kidney: Secondary | ICD-10-CM

## 2018-02-21 IMAGING — CR DG ABDOMEN 1V
2 series · 2 of 2 positions shown · non-contrast
Comparison: One-view abdomen [DATE]. CT of the abdomen and
pelvis [DATE]

CLINICAL DATA: Kidney stones.

EXAM:
ABDOMEN - 1 VIEW

[abdomen kub (1 of 2)]
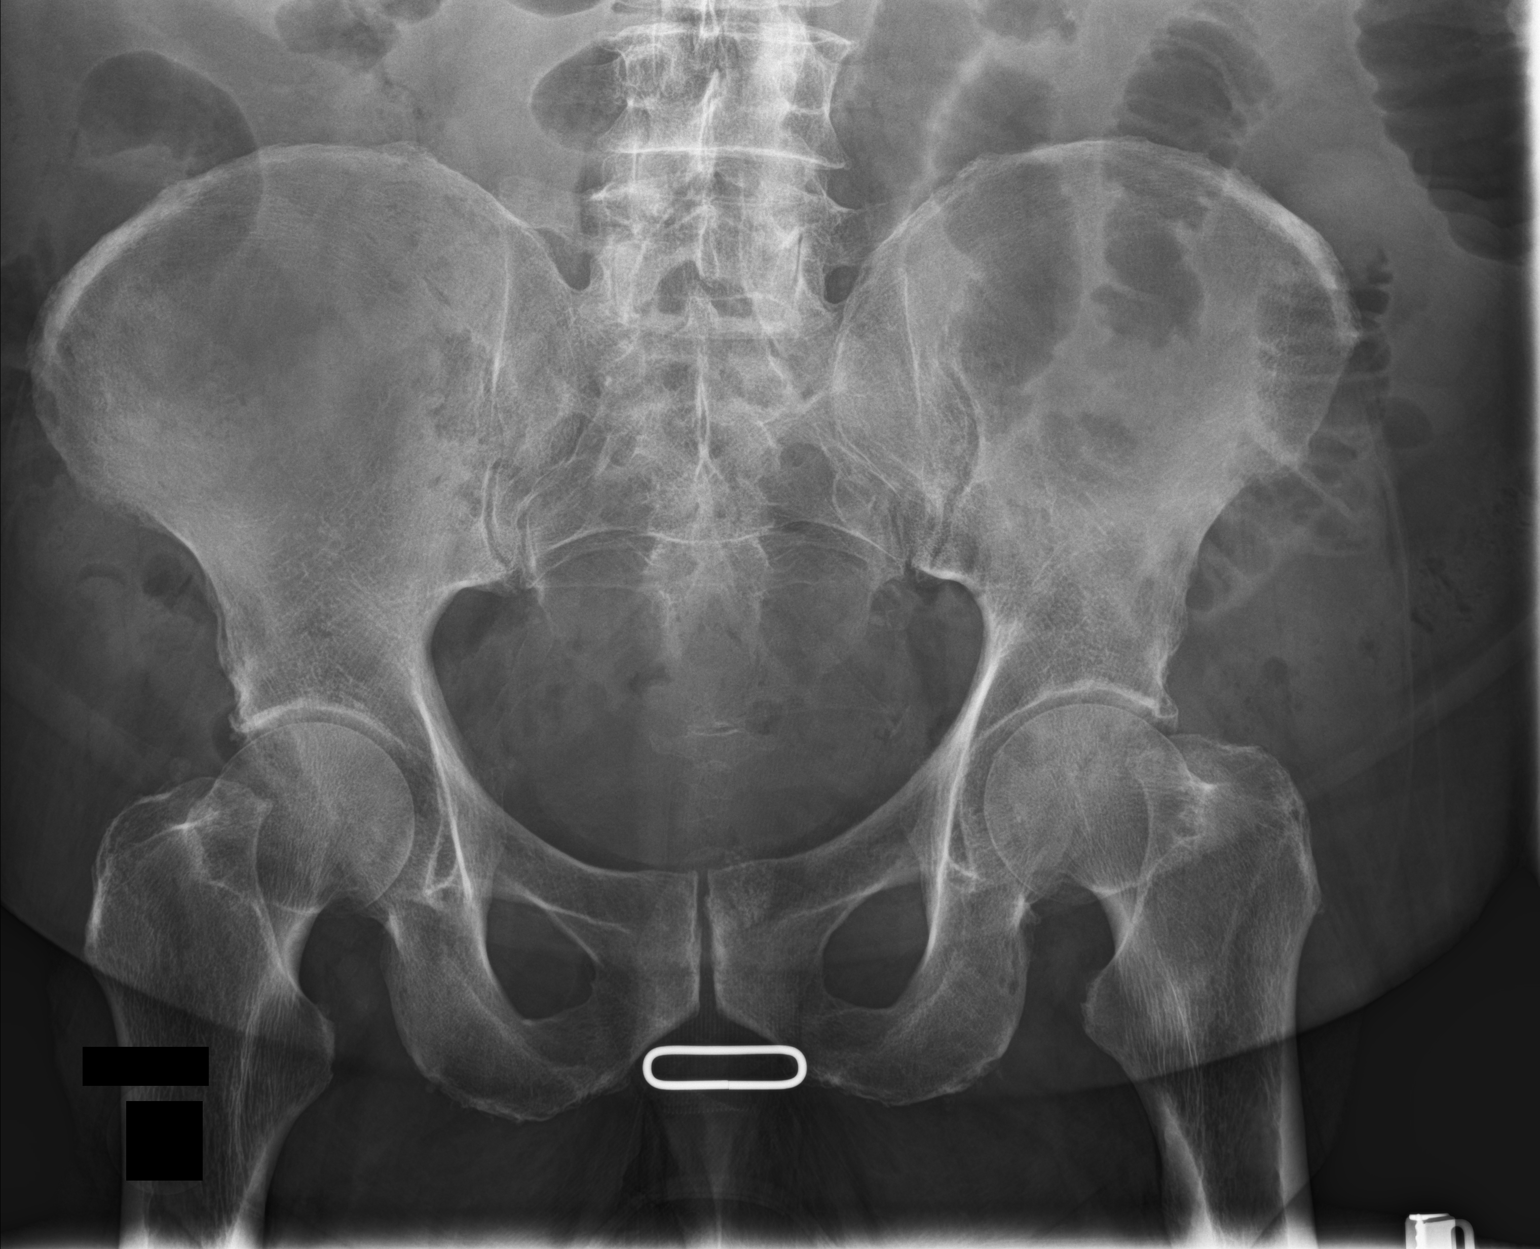

[abdomen kub (2 of 2)]
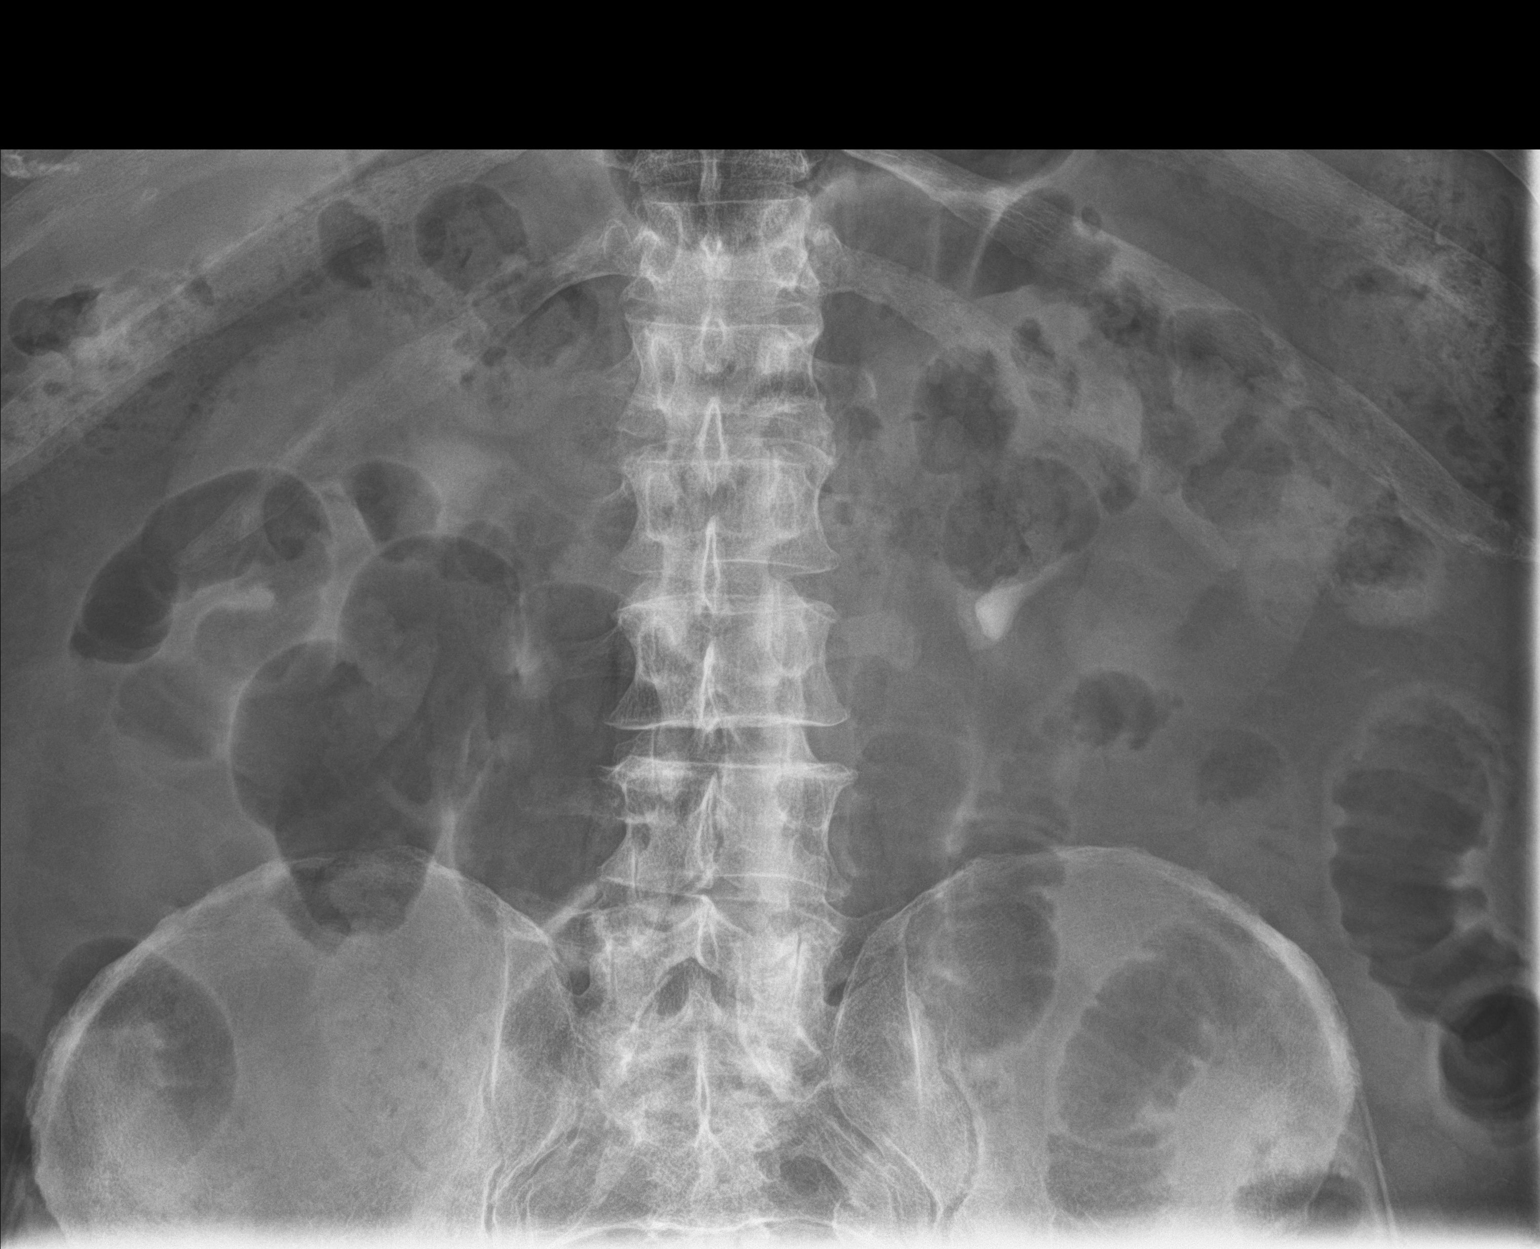

[2 of 2 positions shown; findings below may reference images not displayed]

FINDINGS: Left UPJ calculus is again seen. Mild dilation of bowel loops is
present without obstruction or free air.
IMPRESSION: 1. Stable left UPJ calculus.
2. No new calculi.

## 2018-02-21 NOTE — Progress Notes (Signed)
02/21/2018 4:33 PM   [name] Dec 15, 1950 [id number]  Referring provider: Vidal Schwalbe, MD [address] [place], [place] [address]  Chief Complaint  Patient presents with  . Nephrolithiasis    1 year w/KUB    HPI: 67 year old male with personal history of nephrolithiasis who returns today for routine annual follow-up.  He initially presented with extensive left ureteral stone burden and atrophy along with nonobstructing left-sided stones.  He underwent multiple endoscopic procedures in 2017, please see previous notes for details.  Ultimately, follow-up renal ultrasound showed resolution of his hydronephrosis with a residual stone burden in the left lower pole.  Prior to above, he does have an extensive personal history of kidney stones. He underwent ESWL 2 and ureteroscopy in the past but has not seen a urologist in over 10 years. He's had intermittent flank pain for numerous years but never sought intervention.  [name] currently being followed for 2.4 cm left lower pole nonobstructing stone.  This remains stable now for several years.  He denies any flank pain or gross hematuria.  [name] otherwise asymptomatic.  Stone analysis shows 90% calcium oxalate monohydrate, 5% calcium phosphate, and 5% of an unknown urate.   He does have a personal history of chronic kidney disease.  [name] noted to have a rising creatinine more recently, Cr 2.65.  Repeat renal ultrasound on 12/26/2017 shows no hydronephrosis but no concern for obstruction is contributing factor.  [name] now being followed by nephrology.  His medications are being adjusted.   PMH: Past Medical History:  Diagnosis Date  . Cardiomyopathy ([place])   . Chronic combined systolic (congestive) and diastolic (congestive) heart failure (HCC)    a. EF 25% by cath in 2013 b. Echo in 07/2015 showing EF of 15-20%, moderate MR, moderate Pulm HTN, severely dilated IVC  . CKD (chronic kidney disease) stage 3, GFR 30-59 ml/min (HCC)    . Coronary artery disease   . Diabetes mellitus type 2, uncontrolled ([place])    a. A1c 11.0 in 07/2015.  [name] Hypertension   . Kidney stones    Left  . New onset atrial fibrillation ([place])    a. diagnosed in 07/2015 b. started on Eliquis  . Paroxysmal atrial fibrillation (HCC)   . Pollen allergies 09-21-15   pt called and stated that he woke up and had some drainage-pt states he did not see the color of the drainage-pt denies running a fever and this only happened once-pt instructed to call Dr Audree Bane office if he starts running a fever or if the color of drainage becomes yellow/green  . Psoriasis   . Pulmonary hypertension ([place])   . Renal disorder    kidney stone  . Sleep apnea     Surgical History: Past Surgical History:  Procedure Laterality Date  . CARDIAC CATHETERIZATION N/A 07/25/2015   Procedure: Right/Left Heart Cath and Coronary Angiography;  Surgeon: Wellington Hampshire, MD;  Location: [place] [facility];  Service: Cardiovascular;  Laterality: N/A;  . CARDIAC [place]HE[place]IZATION     [place]  . CARDIAC CATHETERIZATION     [facility], [place]    . CYSTOSCOPY WITH STENT PLACEMENT Left 09/07/2015   Procedure: CYSTOSCOPY WITH STENT PLACEMENT;  Surgeon: Hollice Espy, MD;  Location: [facility];  Service: Urology;  Laterality: Left;  . CYSTOSCOPY/URETEROSCOPY/HOLMIUM LASER/STENT PLACEMENT Left 10/25/2015   Procedure: CYSTOSCOPY/URETEROSCOPY/HOLMIUM LASER/STENT EXCHANGE;  Surgeon: Hollice Espy, MD;  Location: [facility];  Service: Urology;  Laterality: Left;  . KIDNEY SURGERY    . NEPHROSTOMY TUBE PLACEMENT ([place] HX) Left   . URETEROSCOPY WITH HOLMIUM LASER LITHOTRIPSY Left 09/07/2015   Procedure: URETEROSCOPY WITH HOLMIUM LASER LITHOTRIPSY;  Surgeon: Hollice Espy, MD;  Location: [facility];  Service: Urology;  Laterality: Left;    Home Medications:  Allergies as of 02/21/2018   No Known Allergies     Medication List        Accurate as  of 02/21/18 11:59 PM. Always use your most recent med list.          amiodarone 200 MG tablet Commonly known as:  PACERONE Take 1 tablet (200 mg total) by mouth daily.   apixaban 5 MG Tabs tablet Commonly known as:  ELIQUIS Take 1 tablet (5 mg total) 2 (two) times daily by mouth.   atorvastatin 40 MG tablet Commonly known as:  LIPITOR Take 1 tablet (40 mg total) by mouth daily at 6 PM.   b complex vitamins tablet Take 1 tablet by mouth daily.   baclofen 10 MG tablet Commonly known as:  LIORESAL Take 10 mg by mouth daily.   carvedilol 6.25 MG tablet Commonly known as:  COREG TAKE 1 TABLET BY MOUTH TWICE DAILY   cholecalciferol 1000 units tablet Commonly known as:  VITAMIN D Take 2,000 Units by mouth daily.   clonazePAM 1 MG tablet Commonly known as:  KLONOPIN   CO Q 10 PO Take 400 mg by mouth daily.   Cranberry 500 MG Caps Take 1,000 mg by mouth daily.   furosemide 80 MG tablet Commonly known as:  LASIX Take 1 tablet (80 mg total) by mouth daily.   glipiZIDE 10 MG tablet Commonly known as:  GLUCOTROL Take 10 mg by mouth 2 (two) times daily.   Hawthorne Berry 550 MG Caps Take 550 mg by mouth daily.   montelukast 10 MG tablet Commonly known as:  SINGULAIR   MULTI-VITAMINS Tabs Take by mouth.   multivitamin-lutein Caps capsule Take 1 capsule by mouth 2 (two) times daily.   OVER THE COUNTER MEDICATION Take 3 capsules by mouth 2 (two) times daily. Reported on 12/23/2015   OVER THE COUNTER MEDICATION Take 1 capsule by mouth daily. Pt takes caprylic acid.   polyethylene glycol packet Commonly known as:  MIRALAX / GLYCOLAX Take 17 g by mouth daily as needed for mild constipation.   potassium phosphate (monobasic) 500 MG tablet Commonly known as:  K-PHOS ORIGINAL Take 1 tablet (500 mg total) by mouth daily.   TURMERIC CURCUMIN PO Take 1 capsule by mouth daily.   VICTOZA 18 MG/3ML Sopn Generic drug:  liraglutide Inject 1.8 mg into the skin daily.     vitamin C 500 MG tablet Commonly known as:  ASCORBIC ACID Take 1,000 mg by mouth daily.   vitamin E 400 UNIT capsule Take 400 Units by mouth daily.       Allergies: No Known Allergies  Family History: Family History  Problem Relation Age of Onset  . Heart failure Father   . Heart attack Brother   . Kidney cancer Neg Hx   . Bladder Cancer Neg Hx   . Prostate cancer Neg Hx     Social History:  reports that he has never smoked. He has never used smokeless tobacco. He reports that he drinks alcohol. He reports that he does not use drugs.  ROS: UROLOGY Frequent Urination?: Yes Hard to postpone urination?: No Burning/pain with urination?: No Get up at night to urinate?: Yes  Leakage of urine?: No Urine stream starts and stops?: No Trouble starting stream?: No Do you have to strain to urinate?: No Blood in urine?: No Urinary tract infection?: No Sexually transmitted disease?: No Injury to kidneys or bladder?: Yes Painful intercourse?: No Weak stream?: No Erection problems?: Yes Penile pain?: No  Gastrointestinal Nausea?: No Vomiting?: No Indigestion/heartburn?: No Diarrhea?: No Constipation?: Yes  Constitutional Fever: No Night sweats?: No Weight loss?: No Fatigue?: No  Skin Skin rash/lesions?: Yes Itching?: No  Eyes Blurred vision?: Yes Double vision?: No  Ears/Nose/Throat Sore throat?: No Sinus problems?: No  Hematologic/Lymphatic Swollen glands?: No Easy bruising?: Yes  Cardiovascular Leg swelling?: No Chest pain?: No  Respiratory Cough?: No Shortness of breath?: No  Endocrine Excessive thirst?: Yes  Musculoskeletal Back pain?: Yes Joint pain?: No  Neurological Headaches?: No Dizziness?: No  Psychologic Depression?: Yes Anxiety?: Yes  Physical Exam: BP 122/71   Pulse 76   Ht 6' (1.829 m)   Wt 282 lb (127.9 kg)   BMI 38.25 kg/m   Constitutional:  Alert and oriented, No acute distress. HEENT: [place] AT, moist mucus  membranes.  Trachea midline, no masses. Cardiovascular: No clubbing, cyanosis, or edema. Respiratory: Normal respiratory effort, no increased work of breathing. Skin: No rashes, bruises or suspicious lesions. Neurologic: Grossly intact, no focal deficits, moving all 4 extremities. Psychiatric: Normal mood and affect.  Laboratory Data: Lab Results  Component Value Date   WBC 9.1 08/31/2015   HGB 13.3 10/25/2015   HCT 39.0 10/25/2015   MCV 93.3 08/31/2015   PLT 193 08/31/2015    Lab Results  Component Value Date   CREATININE 2.55 (H) 12/12/2017     Lab Results  Component Value Date   HGBA1C 11.0 (H) 07/08/2015    Urinalysis N/a  Pertinent Imaging: CLINICAL DATA:  Kidney stones.  EXAM: ABDOMEN - 1 VIEW  COMPARISON:  One-view abdomen 02/22/2017. CT of the abdomen and pelvis 07/25/2015  FINDINGS: Left UPJ calculus is again seen. Mild dilation of bowel loops is present without obstruction or free air.  IMPRESSION: 1. Stable left UPJ calculus. 2. No new calculi.   Electronically Signed   By: San Morelle M.D.   On: 02/21/2018 16:37  KUB was personally reviewed today with the patient.  This shows stable left kidney stone, unchanged in size from the previous year.  It does appear to have shifted position, otherwise overall size unchanged.   Assessment & Plan:    1. Kidney stones Annual KUB shows no evidence of metabolic activity, stable approximately 2 cm stone Given he remains asymptomatic and has multiple medical comorbidities, will continue to follow this unless he becomes symptomatic [name] agreeable this plan - DG Abd 1 View; Future  2. 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 CKD (chronic kidney disease) stage 3, GFR 30-59 ml/min (HCC) Recent rise in creatinine Now followed by nephrology Renal ultrasound reassuring, no evidence of renal or urinary obstruction   Return in about 1 year (around 02/22/2019) for  KUB.  Hollice Espy, MD  [facility] [address] [place], [place] [address] [phone]

## 2018-03-04 ENCOUNTER — Encounter: Payer: Self-pay | Admitting: Urology

## 2018-03-05 NOTE — Telephone Encounter (Signed)
Thank you for the update.  Jasie Meleski, MD [facility] Pager: [phone]  

## 2018-04-29 ENCOUNTER — Telehealth: Payer: Self-pay | Admitting: Internal Medicine

## 2018-04-29 NOTE — Telephone Encounter (Signed)
Patient last see in 12/2017. Due for 6 month f/u this month to January timeframe. Please call patient and schedule appointment to be seen this month if possible. Please route back to let us know. Thanks!

## 2018-04-29 NOTE — Telephone Encounter (Signed)
   [facility] Pre-operative Risk Assessment    Request for surgical clearance:  1. What type of surgery is being performed? Colonoscopy  2. When is this surgery scheduled? 06/24/2018   3. What type of clearance is required (medical clearance vs. Pharmacy clearance to hold med vs. Both)? Not listed  4. Are there any medications that need to be held prior to surgery and how long? Not listed  5. Practice name and name of physician performing surgery? [facility]  6. What is your office phone number [phone]   7.   What is your office fax number [phone]  8.   Anesthesia type (None, local, MAC, general) ? monitored   Lucienne Minks 04/29/2018, 9:14 AM  _________________________________________________________________   (provider comments below)

## 2018-04-30 NOTE — Telephone Encounter (Signed)
Called patient and he was in the middle of shopping asked for [place] to call back on Tuesday He did say he only wanted to see Dr End When I mentioned to him that his next would be after the procedure he was a bit upset asking "why is Dr End this busy. Can't Dr Fletcher Anon or Dr Rockey Situ handle things up here"  He refused to schedule until we called on Tuesday about this

## 2018-05-06 NOTE — Telephone Encounter (Signed)
Patient scheduled for 07/02/17

## 2018-05-12 ENCOUNTER — Ambulatory Visit (INDEPENDENT_AMBULATORY_CARE_PROVIDER_SITE_OTHER): Payer: Medicare Other | Admitting: Internal Medicine

## 2018-05-12 ENCOUNTER — Encounter: Payer: Self-pay | Admitting: Internal Medicine

## 2018-05-12 VITALS — BP 120/60 | HR 69 | Ht 72.0 in | Wt 281.5 lb

## 2018-05-12 DIAGNOSIS — I5032 Chronic diastolic (congestive) heart failure: Secondary | ICD-10-CM | POA: Diagnosis not present

## 2018-05-12 DIAGNOSIS — N183 Chronic kidney disease, stage 3 unspecified: Secondary | ICD-10-CM

## 2018-05-12 DIAGNOSIS — I480 Paroxysmal atrial fibrillation: Secondary | ICD-10-CM

## 2018-05-12 DIAGNOSIS — E785 Hyperlipidemia, unspecified: Secondary | ICD-10-CM

## 2018-05-12 DIAGNOSIS — Z01810 Encounter for preprocedural cardiovascular examination: Secondary | ICD-10-CM

## 2018-05-12 DIAGNOSIS — I2510 Atherosclerotic heart disease of native coronary artery without angina pectoris: Secondary | ICD-10-CM | POA: Diagnosis not present

## 2018-05-12 DIAGNOSIS — I10 Essential (primary) hypertension: Secondary | ICD-10-CM

## 2018-05-12 NOTE — Patient Instructions (Signed)
Medication Instructions:  Your physician recommends that you continue on your current medications as directed. Please refer to the Current Medication list given to you today.  If you need a refill on your cardiac medications before your next appointment, please call your pharmacy.   Lab work: Labwork will be requested from your primary care physician.   If you have labs (blood work) drawn today and your tests are completely normal, you will receive your results only by: [name] MyChart Message (if you have MyChart) OR . A paper copy in the mail If you have any lab test that is abnormal or we need to change your treatment, we will call you to review the results.  Testing/Procedures: none  Follow-Up: At [facility], you and your health needs are our priority.  As part of our continuing mission to provide you with exceptional heart care, we have created designated Provider Care Teams.  These Care Teams include your primary Cardiologist (physician) and Advanced Practice Providers (APPs -  Physician Assistants and Nurse Practitioners) who all work together to provide you with the care you need, when you need it. You will need a follow up appointment in 6 months.  Please call our office 2 months in advance to schedule this appointment.  You may see DR Harrell Gave END or one of the following Advanced Practice Providers on your designated Care Team:   Murray Hodgkins, NP Christell Faith, PA-C . Marrianne Mood, PA-C  Any Other Special Instructions Will Be Listed Below (If Applicable).  Dr End said you are cleared for your upcoming colonoscopy.

## 2018-05-12 NOTE — Progress Notes (Signed)
Follow-up Outpatient Visit Date: 05/12/2018  Primary Care Provider: Vidal Schwalbe, MD [address] [place] [address]  Chief Complaint: Follow-up shortness of breath  HPI:  [name] is a 67 y.o. year-old male with history of chronic systolic and diastolic heart failure(LVEF 55-60% in 11/2016), coronary artery disease status post PCI to the proximal LAD, paroxysmal atrial fibrillation, chronic kidney disease stage III, hypertension, and type 2 diabetes mellitus, who presents for follow-up of heart failure, atrial fibrillation, and coronary artery disease.  I last saw [name] in July, at which time he endorsed stable dyspnea on exertion.  Worsening renal function was noted earlier in the summer by his PCP, which prompted several medication changes and referral to nephrology.  With de-escalation of diuretic therapy, renal function has returned to baseline (most recently 1.2 in October).  Despite this, leg edema has been minimal and weight stable.  [name] denies chest pain, palpitations, lightheadedness, and orthopnea.  His exertional dyspnea is stable, though he notes that his activity is quite limited.  [name] tolerating his medications well.  He has not had any significant bleeding, remaining on apixaban.  --------------------------------------------------------------------------------------------------  Past Medical History:  Diagnosis Date  . Cardiomyopathy ([place])   . Chronic combined systolic (congestive) and diastolic (congestive) heart failure (HCC)    a. EF 25% by cath in 2013 b. Echo in 07/2015 showing EF of 15-20%, moderate MR, moderate Pulm HTN, severely dilated IVC  . CKD (chronic kidney disease) stage 3, GFR 30-59 ml/min (HCC)   . Coronary artery disease   . Diabetes mellitus type 2, uncontrolled ([place])    a. A1c 11.0 in 07/2015.  [name] Hypertension   . Kidney stones    Left  . New onset atrial fibrillation ([place])    a. diagnosed in 07/2015 b. started on Eliquis  .  Paroxysmal atrial fibrillation (HCC)   . Pollen allergies 09-21-15   pt called and stated that he woke up and had some drainage-pt states he did not see the color of the drainage-pt denies running a fever and this only happened once-pt instructed to call Dr Audree Bane office if he starts running a fever or if the color of drainage becomes yellow/green  . Psoriasis   . Pulmonary hypertension ([place])   . Renal disorder    kidney stone  . Sleep apnea    Past Surgical History:  Procedure Laterality Date  . CARDIAC CATHETERIZATION N/A 07/25/2015   Procedure: Right/Left Heart Cath and Coronary Angiography;  Surgeon: Wellington Hampshire, MD;  Location: [facility] [place] [facility];  Service: Cardiovascular;  Laterality: N/A;  . CARDIAC [place]     [place]  . CARDIAC CATHETERIZATION     [facility], [place]    . CYSTOSCOPY WITH STENT PLACEMENT Left 09/07/2015   Procedure: CYSTOSCOPY WITH STENT PLACEMENT;  Surgeon: Hollice Espy, MD;  Location: [facility];  Service: Urology;  Laterality: Left;  . CYSTOSCOPY/URETEROSCOPY/HOLMIUM LASER/STENT PLACEMENT Left 10/25/2015   Procedure: CYSTOSCOPY/URETEROSCOPY/HOLMIUM LASER/STENT EXCHANGE;  Surgeon: Hollice Espy, MD;  Location: [facility];  Service: Urology;  Laterality: Left;  . KIDNEY SURGERY    . NEPHROSTOMY TUBE PLACEMENT ([place] HX) Left   . URETEROSCOPY WITH HOLMIUM LASER LITHOTRIPSY Left 09/07/2015   Procedure: URETEROSCOPY WITH HOLMIUM LASER LITHOTRIPSY;  Surgeon: Hollice Espy, MD;  Location: [facility];  Service: Urology;  Laterality: Left;    Current Meds  Medication Sig  . amiodarone (PACERONE) 200 MG tablet Take 1 tablet (200  mg total) by mouth daily. (Patient taking differently: Take 200 mg by mouth every morning. )  . apixaban (ELIQUIS) 5 MG TABS tablet Take 1 tablet (5 mg total) 2 (two) times daily by mouth.  [name] atorvastatin (LIPITOR) 40 MG tablet Take 1 tablet (40 mg total) by mouth daily at 6 PM.  . b  complex vitamins tablet Take 1 tablet by mouth daily.  . baclofen (LIORESAL) 10 MG tablet Take 10 mg by mouth daily.  . carvedilol (COREG) 6.25 MG tablet TAKE 1 TABLET BY MOUTH TWICE DAILY  . clonazePAM (KLONOPIN) 1 MG tablet   . Coenzyme Q10 (CO Q 10 PO) Take 400 mg by mouth daily.  . Cranberry 500 MG CAPS Take 1,000 mg by mouth daily.  . fenofibrate 160 MG tablet   . furosemide (LASIX) 80 MG tablet Take 1 tablet (80 mg total) by mouth daily.  [name] glipiZIDE (GLUCOTROL) 10 MG tablet Take 10 mg by mouth 2 (two) times daily.   [name] Hawthorne Berry 550 MG CAPS Take 550 mg by mouth daily.  . Insulin Glargine, 2 Unit Dial, (TOUJEO MAX SOLOSTAR) 300 UNIT/ML SOPN Inject 20 Units into the skin daily. Titrate up to 60 units/day, per endocrinology instructions.  [name] JARDIANCE 10 MG TABS tablet 10 mg daily.   [name] liraglutide (VICTOZA) 18 MG/3ML SOPN Inject 1.8 mg into the skin daily.  [name] losartan (COZAAR) 25 MG tablet Take 25 mg by mouth daily.   . montelukast (SINGULAIR) 10 MG tablet   . Multiple Vitamin (MULTI-VITAMINS) TABS Take by mouth.  . multivitamin-lutein (OCUVITE-LUTEIN) CAPS capsule Take 1 capsule by mouth 2 (two) times daily.  [name] OVER THE COUNTER MEDICATION Take 3 capsules by mouth 2 (two) times daily. Reported on 12/23/2015  . OVER THE COUNTER MEDICATION Take 1 capsule by mouth daily. Pt takes caprylic acid.  . polyethylene glycol (MIRALAX / GLYCOLAX) packet Take 17 g by mouth daily as needed for mild constipation.  . potassium phosphate, monobasic, (K-PHOS ORIGINAL) 500 MG tablet Take 1 tablet (500 mg total) by mouth daily.  . TURMERIC CURCUMIN PO Take 1 capsule by mouth daily.  . vitamin C (ASCORBIC ACID) 500 MG tablet Take 1,000 mg by mouth daily.  . Vitamin D, Ergocalciferol, (DRISDOL) 1.25 MG (50000 UT) CAPS capsule   . vitamin E 400 UNIT capsule Take 400 Units by mouth daily.    Allergies: Patient has no known allergies.  Social History   Tobacco Use  . Smoking status: Never Smoker  .  Smokeless tobacco: Never Used  Substance Use Topics  . Alcohol use: Yes    Alcohol/week: 0.0 standard drinks    Comment: occasionally  . Drug use: No    Types: Marijuana, Cocaine, Opium, LSD    Comment: Past    Family History  Problem Relation Age of Onset  . Heart failure Father   . Heart attack Brother   . Kidney cancer Neg Hx   . Bladder Cancer Neg Hx   . Prostate cancer Neg Hx     Review of Systems: A 12-system review of systems was performed and was negative except as noted in the HPI.  --------------------------------------------------------------------------------------------------  Physical Exam: BP 120/60 (BP Location: Left Arm, Patient Position: Sitting, Cuff Size: Large)   Pulse 69   Ht 6' (1.829 m)   Wt 281 lb 8 oz (127.7 kg)   BMI 38.18 kg/m   General: NAD. HEENT: No conjunctival pallor or scleral icterus. Moist mucous membranes.  OP clear. Neck: Supple without  lymphadenopathy or thyromegaly.  Unable to assess JVD due to body habitus and overlying facial hair. Lungs: Normal work of breathing. Clear to auscultation bilaterally without wheezes or crackles. Heart: Regular rate and rhythm without murmurs, rubs, or gallops.  Unable to assess PMI due to body habitus. Abd: Bowel sounds present. Soft, NT/ND.  Unable to assess HSM due to body habitus. Ext: Trace pretibial edema bilaterally. Skin: Warm and dry without rash.  EKG: Normal sinus rhythm with left axis deviation, nonspecific intraventricular conduction delay, and poor R wave progression.  No significant change since 12/04/2017.  Lab Results  Component Value Date   WBC 9.1 08/31/2015   HGB 13.3 10/25/2015   HCT 39.0 10/25/2015   MCV 93.3 08/31/2015   PLT 193 08/31/2015    Lab Results  Component Value Date   NA 138 12/12/2017   K 4.2 12/12/2017   CL 95 (L) 12/12/2017   CO2 29 12/12/2017   BUN 46 (H) 12/12/2017   CREATININE 2.55 (H) 12/12/2017   GLUCOSE 254 (H) 12/12/2017   ALT 25 11/22/2015     Lab Results  Component Value Date   CHOL 153 07/09/2015   HDL 34 (L) 07/09/2015   LDLCALC 85 07/09/2015   TRIG 168 (H) 07/09/2015   CHOLHDL 4.5 07/09/2015    --------------------------------------------------------------------------------------------------  ASSESSMENT AND PLAN: Paroxysmal atrial fibrillation No symptoms to suggest recurrence.  Continue current regimen of amiodarone, apixaban, and carvedilol.  Will reach out to Dr. Bartolo Darter regarding most recent LFTs and thyroid function tests.  Plan for repeat LFTs in the summer.  Coronary artery disease No symptoms to suggest worsening coronary insufficiency.  Continue secondary prevention.  In lieu of aspirin, patient is on apixaban given history of atrial fibrillation.  Chronic diastolic heart failure Minimal chronic edema on exam.  Otherwise, patient appears euvolemic with stable NYHA class II symptoms.  Continue current medications.  I will defer titration of diuretics to Drs. Kikel and Kolluru.  Chronic kidney disease stage III Creatinine back to baseline on most recent check this fall.  Continue follow-up with Dr. Juleen Love and avoidance of nephrotoxic drugs.  Hypertension Blood pressure well controlled.  Continue current regimen.  Hyperlipidemia Continue atorvastatin 40 mg daily and follow-up with Dr. Bartolo Darter.  Target LDL less than 70.  Preoperative cardiovascular risk assessment [name] is scheduled for screening colonoscopy next month.  He does not endorse any unstable cardiac symptoms.  I think it is reasonable for him to proceed with this low risk procedure without additional testing.  Apixaban should be held for 2 days prior to the procedure and restarted when felt to be safe by his gastroenterologist.  Aspirin 81 mg daily should be administered while off apixaban, given history of prior stent placement.  Follow-up: Return to clinic in 6 months.  Nelva Bush, MD 05/12/2018 10:41 AM

## 2018-05-14 ENCOUNTER — Telehealth: Payer: Self-pay | Admitting: *Deleted

## 2018-05-14 NOTE — Telephone Encounter (Signed)
Dr End requested TSH and LFTs from patient's PCP Dr Vidal Schwalbe. We received lab work which has been scanned into patient's record but it does include these labs. Left message with Medical Records at PCP to fax it to [place] or call if they don't have.

## 2018-05-15 ENCOUNTER — Encounter: Payer: Self-pay | Admitting: Internal Medicine

## 2018-05-15 NOTE — Telephone Encounter (Signed)
Labs received and routed to Dr End.

## 2018-06-24 ENCOUNTER — Ambulatory Visit: Admission: RE | Admit: 2018-06-24 | Payer: Medicare Other | Source: Home / Self Care | Admitting: Internal Medicine

## 2018-06-24 ENCOUNTER — Encounter: Admission: RE | Payer: Self-pay | Source: Home / Self Care

## 2018-06-24 SURGERY — COLONOSCOPY WITH PROPOFOL
Anesthesia: General

## 2018-11-11 ENCOUNTER — Telehealth: Payer: Self-pay | Admitting: *Deleted

## 2018-11-11 NOTE — Progress Notes (Signed)
Follow-up Outpatient Visit Date: 11/12/2018  Primary Care Provider: Vidal Schwalbe, MD [address] [place] [address]  Chief Complaint: Follow-up coronary artery disease and atrial fibrillation  HPI:  [name] is a 68 y.o. year-old male with history of chronic systolic and diastolic heart failure(LVEF 55-60% in 11/2016),coronary artery disease status post PCI to the proximal LAD, paroxysmal atrial fibrillation, chronic kidney disease stage III, hypertension, and type 2 diabetes mellitus, who presents for follow-up of heart failure, atrial fibrillation, and CAD.  I last saw [name] in December, at which time he reported stable exertional dsypnea.  He was otherwise without complaints, though his activity remained quite limited.  We did not make any medication changes at that time.  Today, [name] reports that he has been doing relatively well.  However, he reports getting upset over the extended COVID-19 screening need to walk an extended distance today in the hospital.  He noted some shortness of breath walking from his car.  He otherwise reports stable exertional dyspnea.  He has not had any significant chest pain nor leg edema.  He denies orthopnea, using his CPAP machine every night.  He also denies palpitations and lightheadedness.  His blood pressure at prior MD visits, as recently as 1 to 2 weeks ago with his endocrinologist, have always been normal (less than 130/80).  He attributes today's elevated blood pressure to being upset about his visit today.  [name] reports being compliant with his medications.  He reports a single episode of bright red blood per rectum 2 to 3 weeks ago in the setting of constipation, which he attributes to hemorrhoids.  Colonoscopy was scheduled for February, though this was deferred due to transportation issues.  [name] due for labs and follow-up visit with Dr. Juleen China (nephrology) later this month.   --------------------------------------------------------------------------------------------------  Past Medical History:  Diagnosis Date  . Cardiomyopathy ([place])   . Chronic combined systolic (congestive) and diastolic (congestive) heart failure (HCC)    a. EF 25% by cath in 2013 b. Echo in 07/2015 showing EF of 15-20%, moderate MR, moderate Pulm HTN, severely dilated IVC  . CKD (chronic kidney disease) stage 3, GFR 30-59 ml/min (HCC)   . Coronary artery disease   . Diabetes mellitus type 2, uncontrolled ([place])    a. A1c 11.0 in 07/2015.  [name] Hypertension   . Kidney stones    Left  . New onset atrial fibrillation ([place])    a. diagnosed in 07/2015 b. started on Eliquis  . Paroxysmal atrial fibrillation (HCC)   . Pollen allergies 09-21-15   pt called and stated that he woke up and had some drainage-pt states he did not see the color of the drainage-pt denies running a fever and this only happened once-pt instructed to call Dr Audree Bane office if he starts running a fever or if the color of drainage becomes yellow/green  . Psoriasis   . Pulmonary hypertension ([place])   . Renal disorder    kidney stone  . Sleep apnea    Past Surgical History:  Procedure Laterality Date  . CARDIAC CATHETERIZATION N/A 07/25/2015   Procedure: Right/Left Heart Cath and Coronary Angiography;  Surgeon: [place], MD;  Location: [place] [facility];  Service: Cardiovascular;  Laterality: N/A;  . CARDIAC CAT[place]ATION     [place]  . CARDIAC CATHETERIZATION     [facility], [place]    . CYSTOSCOPY WITH STENT PLACEMENT Left 09/07/2015  Procedure: CYSTOSCOPY WITH STENT PLACEMENT;  Surgeon: Hollice Espy, MD;  Location: [facility];  Service: Urology;  Laterality: Left;  . CYSTOSCOPY/URETEROSCOPY/HOLMIUM LASER/STENT PLACEMENT Left 10/25/2015   Procedure: CYSTOSCOPY/URETEROSCOPY/HOLMIUM LASER/STENT EXCHANGE;  Surgeon: Hollice Espy, MD;  Location: [facility];   Service: Urology;  Laterality: Left;  . KIDNEY SURGERY    . NEPHROSTOMY TUBE PLACEMENT ([place] HX) Left   . URETEROSCOPY WITH HOLMIUM LASER LITHOTRIPSY Left 09/07/2015   Procedure: URETEROSCOPY WITH HOLMIUM LASER LITHOTRIPSY;  Surgeon: Hollice Espy, MD;  Location: [facility];  Service: Urology;  Laterality: Left;   Current Meds  Medication Sig  . amiodarone (PACERONE) 200 MG tablet Take 1 tablet (200 mg total) by mouth daily. (Patient taking differently: Take 200 mg by mouth every morning. )  . apixaban (ELIQUIS) 5 MG TABS tablet Take 1 tablet (5 mg total) 2 (two) times daily by mouth.  [name] atorvastatin (LIPITOR) 40 MG tablet Take 1 tablet (40 mg total) by mouth daily at 6 PM.  . b complex vitamins tablet Take 1 tablet by mouth daily.  . baclofen (LIORESAL) 10 MG tablet Take 10 mg by mouth daily.  . carvedilol (COREG) 6.25 MG tablet TAKE 1 TABLET BY MOUTH TWICE DAILY  . clonazePAM (KLONOPIN) 1 MG tablet   . Coenzyme Q10 (CO Q 10 PO) Take 400 mg by mouth daily.  . Cranberry 500 MG CAPS Take 1,000 mg by mouth daily.  . fenofibrate 160 MG tablet   . furosemide (LASIX) 80 MG tablet Take 1 tablet (80 mg total) by mouth daily.  [name] glipiZIDE (GLUCOTROL) 10 MG tablet Take 10 mg by mouth 2 (two) times daily.   [name] Hawthorne Berry 550 MG CAPS Take 550 mg by mouth daily.  . Insulin Glargine, 2 Unit Dial, (TOUJEO MAX SOLOSTAR) 300 UNIT/ML SOPN Inject 20 Units into the skin daily. Titrate up to 60 units/day, per endocrinology instructions.  [name] JARDIANCE 10 MG TABS tablet 10 mg daily.   [name] liraglutide (VICTOZA) 18 MG/3ML SOPN Inject 1.8 mg into the skin daily.  [name] losartan (COZAAR) 25 MG tablet Take 25 mg by mouth daily.   . montelukast (SINGULAIR) 10 MG tablet   . Multiple Vitamin (MULTI-VITAMINS) TABS Take by mouth.  . multivitamin-lutein (OCUVITE-LUTEIN) CAPS capsule Take 1 capsule by mouth 2 (two) times daily.  [name] OVER THE COUNTER MEDICATION Take 3 capsules by mouth 2 (two) times daily. Reported on 12/23/2015   . OVER THE COUNTER MEDICATION Take 1 capsule by mouth daily. Pt takes caprylic acid.  . polyethylene glycol (MIRALAX / GLYCOLAX) packet Take 17 g by mouth daily as needed for mild constipation.  . potassium phosphate, monobasic, (K-PHOS ORIGINAL) 500 MG tablet Take 1 tablet (500 mg total) by mouth daily.  . TURMERIC CURCUMIN PO Take 1 capsule by mouth daily.  . vitamin C (ASCORBIC ACID) 500 MG tablet Take 1,000 mg by mouth daily.  . Vitamin D, Ergocalciferol, (DRISDOL) 1.25 MG (50000 UT) CAPS capsule   . vitamin E 400 UNIT capsule Take 400 Units by mouth daily.    Allergies: Patient has no known allergies.  Social History   Tobacco Use  . Smoking status: Never Smoker  . Smokeless tobacco: Never Used  Substance Use Topics  . Alcohol use: Yes    Alcohol/week: 0.0 standard drinks    Comment: occasionally  . Drug use: No    Types: Marijuana, Cocaine, Opium, LSD    Comment: Past    Family History  Problem Relation Age of Onset  . Heart  failure Father   . Heart attack Brother   . Kidney cancer Neg Hx   . Bladder Cancer Neg Hx   . Prostate cancer Neg Hx     Review of Systems: A 12-system review of systems was performed and was negative except as noted in the HPI.  --------------------------------------------------------------------------------------------------  Physical Exam: BP (!) 160/76 (BP Location: Left Arm, Patient Position: Sitting, Cuff Size: Large)   Pulse 90   Temp (!) 97 F (36.1 C)   Ht 6' (1.829 m)   Wt 279 lb 12 oz (126.9 kg)   BMI 37.94 kg/m   General: NAD. HEENT: No conjunctival pallor or scleral icterus. Moist mucous membranes.  OP clear. Neck: Supple without lymphadenopathy, thyromegaly, JVD, or HJR. No carotid bruit. Lungs: Normal work of breathing. Clear to auscultation bilaterally without wheezes or crackles. Heart: Regular rate and rhythm without murmurs, rubs, or gallops. Non-displaced PMI. Abd: Bowel sounds present. Soft, NT/ND without  hepatosplenomegaly Ext: No lower extremity edema. Radial, PT, and DP pulses are 2+ bilaterally. Skin: Warm and dry without rash.  EKG: Normal sinus rhythm with first-degree AV block nonspecific intraventricular conduction delay and poor R wave progression.  Lab Results  Component Value Date   WBC 9.1 08/31/2015   HGB 13.3 10/25/2015   HCT 39.0 10/25/2015   MCV 93.3 08/31/2015   PLT 193 08/31/2015    Lab Results  Component Value Date   NA 138 12/12/2017   K 4.2 12/12/2017   CL 95 (L) 12/12/2017   CO2 29 12/12/2017   BUN 46 (H) 12/12/2017   CREATININE 2.55 (H) 12/12/2017   GLUCOSE 254 (H) 12/12/2017   ALT 25 11/22/2015    Lab Results  Component Value Date   CHOL 153 07/09/2015   HDL 34 (L) 07/09/2015   LDLCALC 85 07/09/2015   TRIG 168 (H) 07/09/2015   CHOLHDL 4.5 07/09/2015    --------------------------------------------------------------------------------------------------  ASSESSMENT AND PLAN: Paroxysmal atrial fibrillation: [name] denies symptoms to suggest recurrence of atrial fibrillation.  He remains in sinus rhythm today.  We will continue current regimen of carvedilol, amiodarone, and apixaban.  CBC, TSH, and LFTs should be checked with blood draw later this month.  I have asked [name] to have Drs. Kikel and Kolluru forward these results to [place] for review.  Most recent set of PFTs was in 12/2017.  We will discussed repeating at follow-up.  Coronary artery disease: Stable exertional dyspnea without any chest pain.  Continue current medications for secondary prevention.  Chronic HFpEF: [name] is euvolemic with stable NYHA class II symptoms.  We will continue current medications.  I will defer adjustment of diuretics to doctors Kikel and Kolluru.  Hypertension: Blood pressure typically well controlled but moderately elevated today.  Today's elevation could certainly be due to stress and agitation.  Blood pressure at endocrinology visit 2 days ago was  120/78.  I will not make any medication changes at this time.  Hyperlipidemia: Continue statin therapy with target LDL less than 70.  I will defer continued monitoring to Dr. Bartolo Darter.  Chronic kidney disease stage III: Most recent creatinine from 05/2018 was 1.6.  [name] should continue scheduled follow-up with Dr. Juleen China.  Follow-up: Virtual visit in 6 months.  Nelva Bush, MD 11/12/2018 2:34 PM

## 2018-11-11 NOTE — Telephone Encounter (Signed)

## 2018-11-12 ENCOUNTER — Encounter: Payer: Self-pay | Admitting: Internal Medicine

## 2018-11-12 ENCOUNTER — Ambulatory Visit (INDEPENDENT_AMBULATORY_CARE_PROVIDER_SITE_OTHER): Payer: Medicare Other | Admitting: Internal Medicine

## 2018-11-12 ENCOUNTER — Other Ambulatory Visit: Payer: Self-pay

## 2018-11-12 VITALS — BP 160/76 | HR 90 | Temp 97.0°F | Ht 72.0 in | Wt 279.8 lb

## 2018-11-12 DIAGNOSIS — I2510 Atherosclerotic heart disease of native coronary artery without angina pectoris: Secondary | ICD-10-CM | POA: Diagnosis not present

## 2018-11-12 DIAGNOSIS — I480 Paroxysmal atrial fibrillation: Secondary | ICD-10-CM | POA: Diagnosis not present

## 2018-11-12 DIAGNOSIS — I5032 Chronic diastolic (congestive) heart failure: Secondary | ICD-10-CM

## 2018-11-12 DIAGNOSIS — I10 Essential (primary) hypertension: Secondary | ICD-10-CM

## 2018-11-12 DIAGNOSIS — N183 Chronic kidney disease, stage 3 unspecified: Secondary | ICD-10-CM

## 2018-11-12 DIAGNOSIS — E785 Hyperlipidemia, unspecified: Secondary | ICD-10-CM

## 2018-11-12 NOTE — Patient Instructions (Signed)
Medication Instructions:  No changes at this time.  If you need a refill on your cardiac medications before your next appointment, please call your pharmacy.   Lab work: None  If you have labs (blood work) drawn today and your tests are completely normal, you will receive your results only by: [name] MyChart Message (if you have MyChart) OR . A paper copy in the mail If you have any lab test that is abnormal or we need to change your treatment, we will call you to review the results.  Testing/Procedures: None  Follow-Up: At [facility], you and your health needs are our priority.  As part of our continuing mission to provide you with exceptional heart care, we have created designated Provider Care Teams.  These Care Teams include your primary Cardiologist (physician) and Advanced Practice Providers (APPs -  Physician Assistants and Nurse Practitioners) who all work together to provide you with the care you need, when you need it. You will need a follow up VIRTUAL appointment in 6 months.  Please call our office 2 months in advance to schedule this appointment.  You may see  or one of the following Advanced Practice Providers on your designated Care Team:   Murray Hodgkins, NP Christell Faith, PA-C . Marrianne Mood, PA-C  The virtual visit they will call prior to review your medications, blood pressure, heart rate, and answer any questions you may have or what to expect with the visit.

## 2019-01-30 ENCOUNTER — Ambulatory Visit
Admission: RE | Admit: 2019-01-30 | Discharge: 2019-01-30 | Disposition: A | Discharge status: Home / Self Care | Payer: Medicare Other | Source: Ambulatory Visit | Attending: Urology | Admitting: Urology

## 2019-01-30 ENCOUNTER — Ambulatory Visit (INDEPENDENT_AMBULATORY_CARE_PROVIDER_SITE_OTHER): Payer: Medicare Other | Admitting: Urology

## 2019-01-30 ENCOUNTER — Other Ambulatory Visit: Payer: Self-pay

## 2019-01-30 ENCOUNTER — Encounter: Payer: Self-pay | Admitting: Urology

## 2019-01-30 ENCOUNTER — Other Ambulatory Visit
Admission: RE | Admit: 2019-01-30 | Discharge: 2019-01-30 | Disposition: A | Discharge status: Home / Self Care | Payer: Medicare Other | Source: Home / Self Care | Attending: Urology | Admitting: Urology

## 2019-01-30 ENCOUNTER — Ambulatory Visit
Admission: RE | Admit: 2019-01-30 | Discharge: 2019-01-30 | Disposition: A | Discharge status: Home / Self Care | Payer: Medicare Other | Attending: Urology | Admitting: Urology

## 2019-01-30 VITALS — BP 114/68 | HR 89 | Ht 72.0 in | Wt 293.0 lb

## 2019-01-30 DIAGNOSIS — N183 Chronic kidney disease, stage 3 unspecified: Secondary | ICD-10-CM

## 2019-01-30 DIAGNOSIS — R109 Unspecified abdominal pain: Secondary | ICD-10-CM | POA: Diagnosis not present

## 2019-01-30 DIAGNOSIS — I2510 Atherosclerotic heart disease of native coronary artery without angina pectoris: Secondary | ICD-10-CM

## 2019-01-30 DIAGNOSIS — N200 Calculus of kidney: Secondary | ICD-10-CM | POA: Diagnosis present

## 2019-01-30 DIAGNOSIS — N261 Atrophy of kidney (terminal): Secondary | ICD-10-CM | POA: Diagnosis not present

## 2019-01-30 DIAGNOSIS — R10A2 Flank pain, left side: Secondary | ICD-10-CM

## 2019-01-30 LAB — URINALYSIS, COMPLETE (UACMP) WITH MICROSCOPIC
Bacteria, UA: NONE SEEN
Bilirubin Urine: NEGATIVE
Glucose, UA: >1000 mg/dL — AB
Nitrite: NEGATIVE
Protein, ur: NEGATIVE mg/dL
Specific Gravity, Urine: 1.015 (ref 1.005–1.030)
pH: 5 (ref 5.0–8.0)

## 2019-01-30 IMAGING — CR ABDOMEN - 1 VIEW
3 series · 3 of 3 positions shown · non-contrast
Comparison: [DATE]

CLINICAL DATA: Left-sided flank pain for a week. History of
lithotripsy.

EXAM:
ABDOMEN - 1 VIEW

[abdomen kub (1 of 3)]
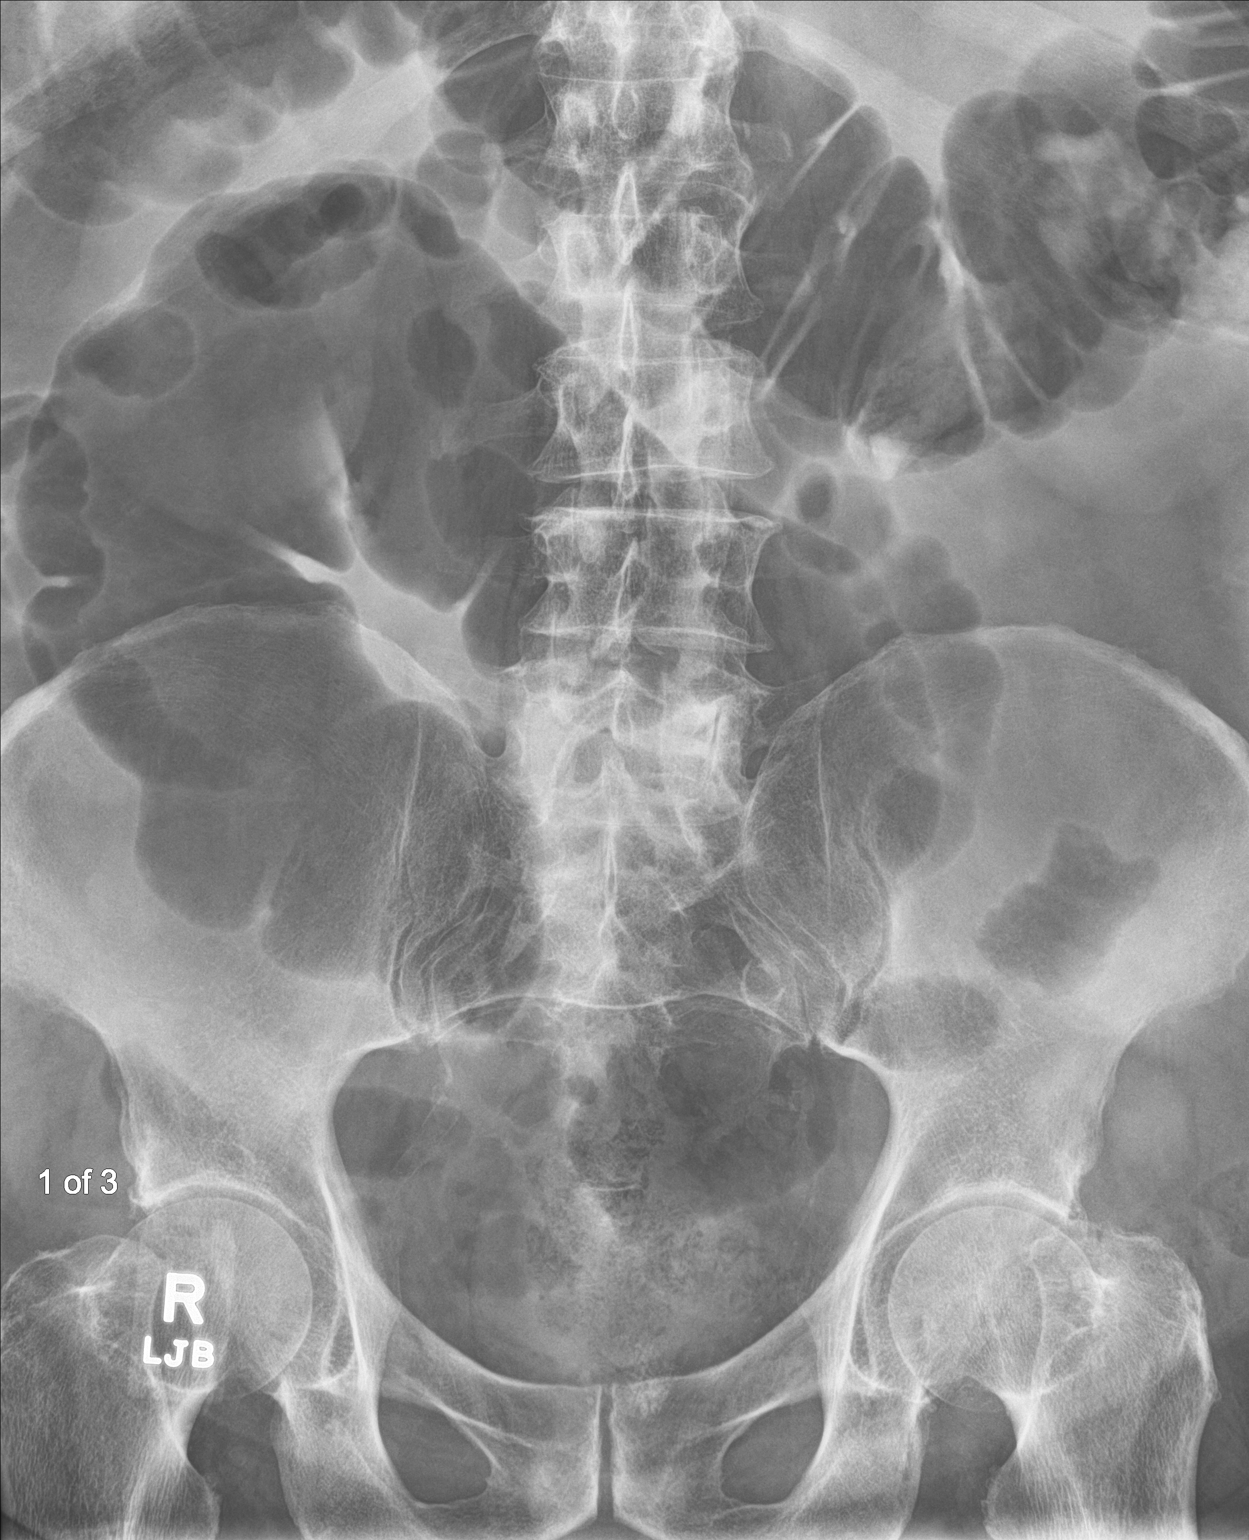

[abdomen kub (2 of 3)]
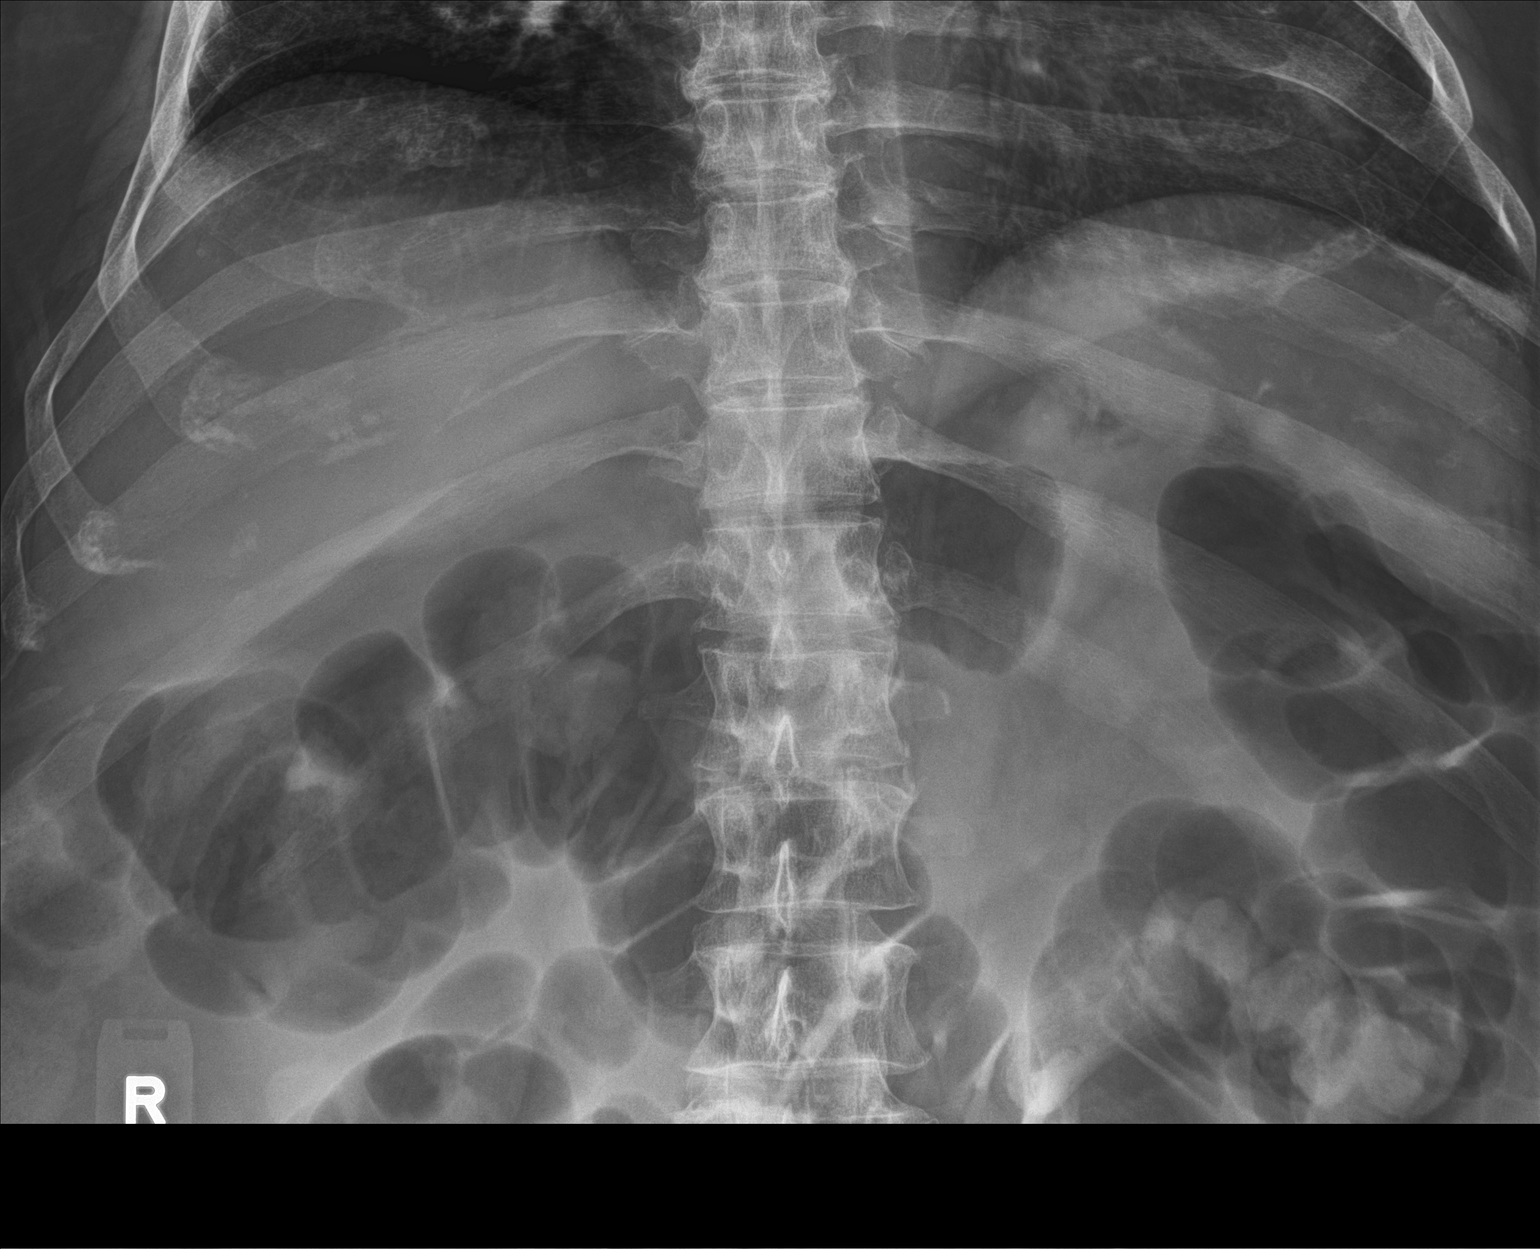

[abdomen kub (3 of 3)]
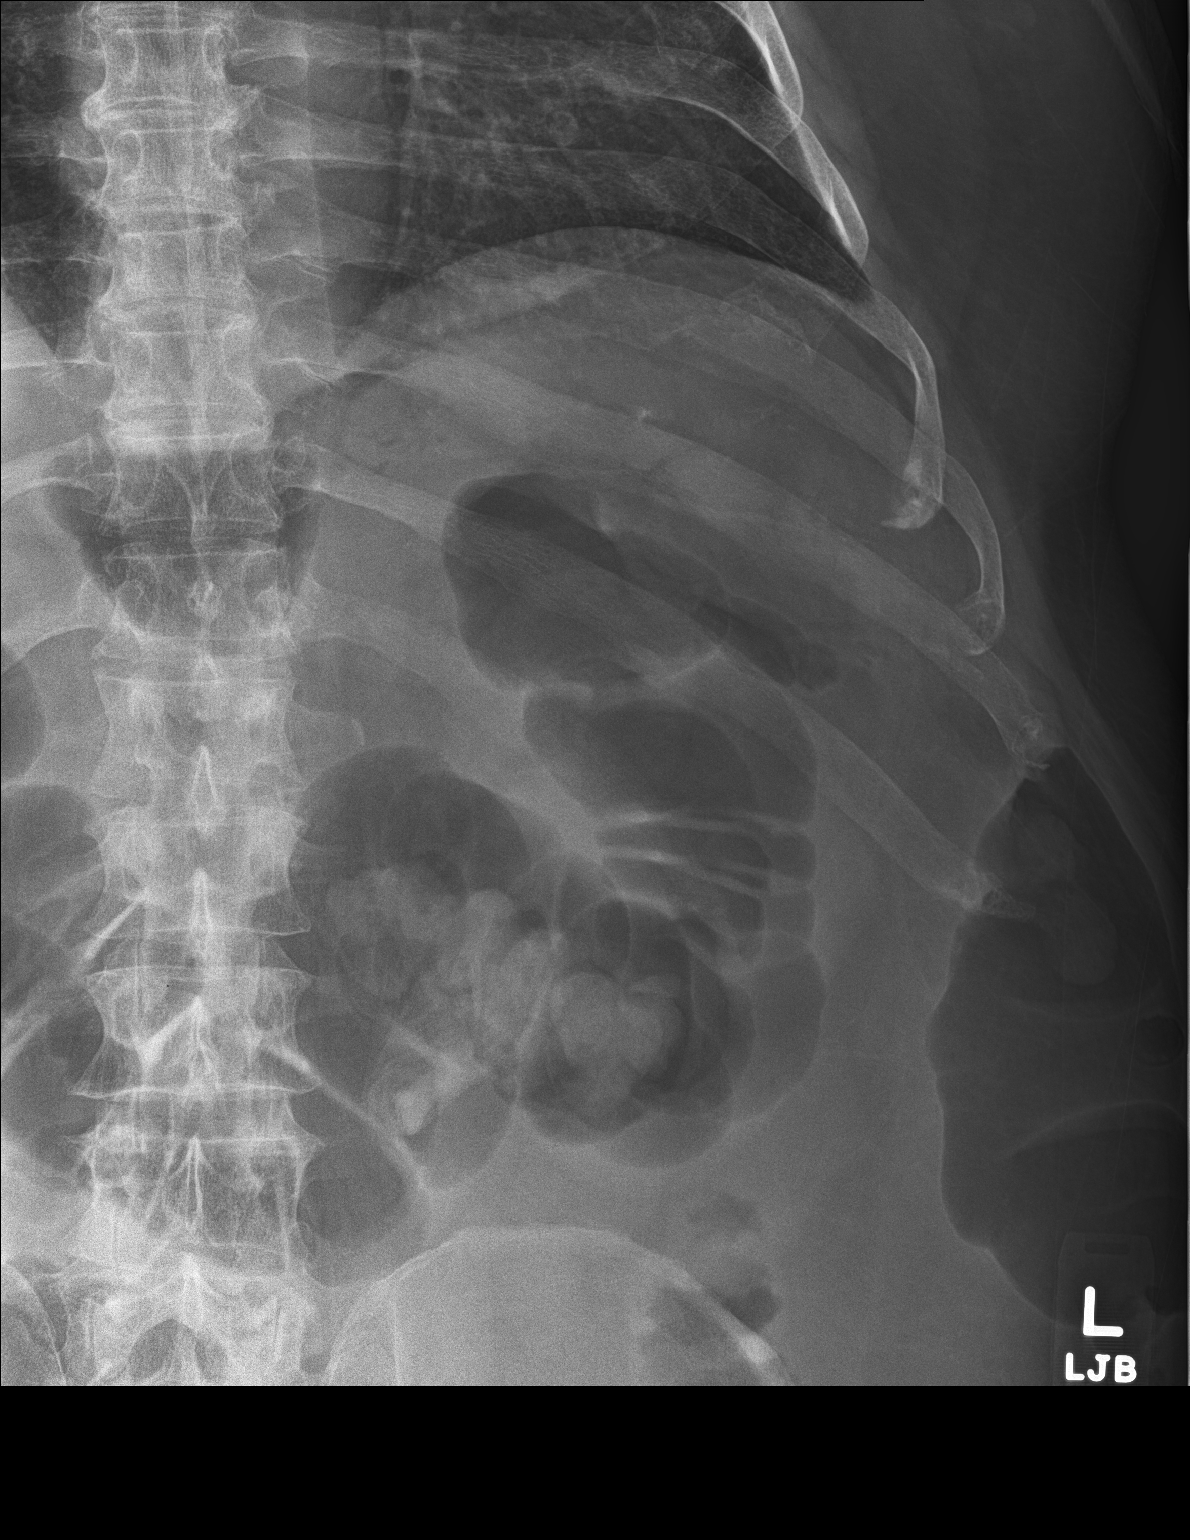

[3 of 3 positions shown; findings below may reference images not displayed]

FINDINGS: Gaseous distension seen in the colon. Colon measures up to 8 cm in
the redundant somewhat tortuous sigmoid colon. No free air, portal
venous gas, or pneumatosis. No small bowel dilatation identified.
Both kidneys are obscured by bowel contents. High attenuation
material is seen in the left side of the abdomen which could
represent bowel contents. It is difficult to confidently exclude
renal or ureteral stones on this study due to the overlapping bowel
contents.
IMPRESSION: 1. Evaluation for renal or ureteral stones is severely limited due
to overlapping bowel contents.
2. Air-filled prominent colon. Colonic ileus is favored. A CT scan
has already been ordered for this patient. Recommend attention to
the colon on the CT scan.

## 2019-01-30 NOTE — Progress Notes (Addendum)
01/30/2019 2:50 PM   [name] [facility] [birth year] [id number]  Referring provider: Vidal Schwalbe, MD [address] [place],  [place] [address]  Chief Complaint  Patient presents with   Nephrolithiasis    1 year w/KUB    HPI:  68 year old male with history of nephrolithiasis returns for annual follow-up.  Unfortunately today, he is not feeling particularly well.  He reports that over the last 4 to 5 days, he has been having intermittent left flank pain.  Severe enough that he is had to take hydrocodone on several occasions.  The pain comes and goes.  No alleviating or exacerbating features.  He does have some associated nausea without vomiting.  He also reports just feeling fatigued, and overall unwell.  He has no symptoms other than the above outside of this.  [name] not had an episode of flank pain or stone episodes since last year.  He also mentions today that last week, he was treated for an asymptomatic urinary tract infection by his primary care physician.  He underwent routine labs which included a urinalysis which he was told was positive and he just completed a course of antibiotics.  He had no associated urinary tract symptoms including urgency, frequency, dysuria, fevers or chills.  He reports that he was told that his creatinine was actually improved from previous.  These labs are not available to me today however the patient is able to download them and will send [place] a MyChart message with both his urinalysis and labs once he gets home.   Previous history: He initially presented with extensive left ureteral stone burden and atrophy along with nonobstructing left-sided stones.  He underwent multiple endoscopic procedures in 2017, please see previous notes for details.  Ultimately, follow-up renal ultrasound showed resolution of his hydronephrosis with a residual stone burden in the left lower pole.  Prior to above, he does have an extensive personal history of kidney  stones. He underwent ESWL 2 and ureteroscopy in the past but has not seen a urologist in over 10 years. He's had intermittent flank pain for numerous years but never sought intervention.  [name] currently being followed for 2.4 cm left lower pole nonobstructing stone.  This remains stable now for several years.  Stone analysis shows 90% calcium oxalate monohydrate, 5% calcium phosphate, and 5% of an unknown urate.   He does have a personal history of chronic kidney disease.    PMH: Past Medical History:  Diagnosis Date   Cardiomyopathy ([place])    Chronic combined systolic (congestive) and diastolic (congestive) heart failure (HCC)    a. EF 25% by cath in 2013 b. Echo in 07/2015 showing EF of 15-20%, moderate MR, moderate Pulm HTN, severely dilated IVC   CKD (chronic kidney disease) stage 3, GFR 30-59 ml/min (HCC)    Coronary artery disease    Diabetes mellitus type 2, uncontrolled ([place])    a. A1c 11.0 in 07/2015.   Hypertension    Kidney stones    Left   New onset atrial fibrillation ([place])    a. diagnosed in 07/2015 b. started on Eliquis   Paroxysmal atrial fibrillation (HCC)    Pollen allergies 09-21-15   pt called and stated that he woke up and had some drainage-pt states he did not see the color of the drainage-pt denies running a fever and this only happened once-pt instructed to call Dr Audree Bane office if he starts running a fever or if the color of drainage becomes yellow/green  Psoriasis    Pulmonary hypertension (HCC)    Renal disorder    kidney stone   Sleep apnea     Surgical History: Past Surgical History:  Procedure Laterality Date   CARDIAC CATHETERIZATION N/A 07/25/2015   Procedure: Right/Left Heart Cath and Coronary Angiography;  Surgeon: [place], MD;  Location: [place] [facility];  Service: Cardiovascular;  Laterality: N/A;   CARDIAC CATHETERIZATION     [place], [place]   CORONARY  ANGIOPLASTY WITH STENT PLACEMENT     CYSTOSCOPY WITH STENT PLACEMENT Left 09/07/2015   Procedure: CYSTOSCOPY WITH STENT PLACEMENT;  Surgeon: Hollice Espy, MD;  Location: [facility];  Service: Urology;  Laterality: Left;   CYSTOSCOPY/URETEROSCOPY/HOLMIUM LASER/STENT PLACEMENT Left 10/25/2015   Procedure: CYSTOSCOPY/URETEROSCOPY/HOLMIUM LASER/STENT EXCHANGE;  Surgeon: Hollice Espy, MD;  Location: [facility];  Service: Urology;  Laterality: Left;   KIDNEY SURGERY     NEPHROSTOMY TUBE PLACEMENT ([place] HX) Left    URETEROSCOPY WITH HOLMIUM LASER LITHOTRIPSY Left 09/07/2015   Procedure: URETEROSCOPY WITH HOLMIUM LASER LITHOTRIPSY;  Surgeon: Hollice Espy, MD;  Location: [facility];  Service: Urology;  Laterality: Left;    Home Medications:  Allergies as of 01/30/2019   No Known Allergies     Medication List       Accurate as of January 30, 2019  2:50 PM. If you have any questions, ask your nurse or doctor.        STOP taking these medications   atorvastatin 40 MG tablet Commonly known as: LIPITOR Stopped by: Hollice Espy, MD   fenofibrate 160 MG tablet Stopped by: Hollice Espy, MD   Victoza 18 MG/3ML Sopn Generic drug: liraglutide Stopped by: Hollice Espy, MD     TAKE these medications   amiodarone 200 MG tablet Commonly known as: PACERONE Take 1 tablet (200 mg total) by mouth daily. What changed: when to take this   apixaban 5 MG Tabs tablet Commonly known as: Eliquis Take 1 tablet (5 mg total) 2 (two) times daily by mouth.   b complex vitamins tablet Take 1 tablet by mouth daily.   baclofen 10 MG tablet Commonly known as: LIORESAL Take 10 mg by mouth daily.   carvedilol 6.25 MG tablet Commonly known as: COREG TAKE 1 TABLET BY MOUTH TWICE DAILY   clonazePAM 1 MG tablet Commonly known as: KLONOPIN   CO Q 10 PO Take 400 mg by mouth daily.   Cranberry 500 MG Caps Take 1,000 mg by mouth daily.   furosemide 80 MG tablet Commonly known as: LASIX Take 1  tablet (80 mg total) by mouth daily.   glipiZIDE 10 MG tablet Commonly known as: GLUCOTROL Take 10 mg by mouth 2 (two) times daily.   Hawthorne Berry 550 MG Caps Take 550 mg by mouth daily.   Jardiance 10 MG Tabs tablet Generic drug: empagliflozin 10 mg daily.   losartan 25 MG tablet Commonly known as: COZAAR Take 25 mg by mouth daily.   montelukast 10 MG tablet Commonly known as: SINGULAIR   Multi-Vitamins Tabs Take by mouth.   multivitamin-lutein Caps capsule Take 1 capsule by mouth 2 (two) times daily.   OVER THE COUNTER MEDICATION Take 3 capsules by mouth 2 (two) times daily. Reported on 12/23/2015   OVER THE COUNTER MEDICATION Take 1 capsule by mouth daily. Pt takes caprylic acid.   Ozempic (1 MG/DOSE) 2 MG/1.5ML Sopn Generic drug: Semaglutide (1 MG/DOSE)   polyethylene glycol 17 g packet Commonly  known as: MIRALAX / GLYCOLAX Take 17 g by mouth daily as needed for mild constipation.   potassium phosphate (monobasic) 500 MG tablet Commonly known as: K-PHOS ORIGINAL Take 1 tablet (500 mg total) by mouth daily.   rosuvastatin 20 MG tablet Commonly known as: CRESTOR   Toujeo Max SoloStar 300 UNIT/ML Sopn Generic drug: Insulin Glargine (2 Unit Dial) Inject 20 Units into the skin daily. Titrate up to 60 units/day, per endocrinology instructions.   TURMERIC CURCUMIN PO Take 1 capsule by mouth daily.   vitamin C 500 MG tablet Commonly known as: ASCORBIC ACID Take 1,000 mg by mouth daily.   Vitamin D (Ergocalciferol) 1.25 MG (50000 UT) Caps capsule Commonly known as: DRISDOL   vitamin E 400 UNIT capsule Take 400 Units by mouth daily.       Allergies: No Known Allergies  Family History: Family History  Problem Relation Age of Onset   Heart failure Father    Heart attack Brother    Kidney cancer Neg Hx    Bladder Cancer Neg Hx    Prostate cancer Neg Hx     Social History:  reports that he has never smoked. He has never used smokeless  tobacco. He reports current alcohol use. He reports that he does not use drugs.  ROS: UROLOGY Frequent Urination?: No Hard to postpone urination?: No Burning/pain with urination?: No Get up at night to urinate?: No Leakage of urine?: No Urine stream starts and stops?: No Trouble starting stream?: No Do you have to strain to urinate?: No Blood in urine?: No Urinary tract infection?: No Sexually transmitted disease?: No Injury to kidneys or bladder?: No Painful intercourse?: No Weak stream?: No Erection problems?: No Penile pain?: No  Gastrointestinal Nausea?: Yes Vomiting?: No Indigestion/heartburn?: No Diarrhea?: No Constipation?: No  Constitutional Fever: No Night sweats?: Yes Weight loss?: No Fatigue?: No  Skin Skin rash/lesions?: No Itching?: No  Eyes Blurred vision?: No Double vision?: No  Ears/Nose/Throat Sore throat?: No Sinus problems?: No  Hematologic/Lymphatic Swollen glands?: No Easy bruising?: No  Cardiovascular Leg swelling?: No Chest pain?: No  Respiratory Cough?: Yes Shortness of breath?: No  Endocrine Excessive thirst?: No  Musculoskeletal Back pain?: Yes Joint pain?: No  Neurological Headaches?: No Dizziness?: No  Psychologic Depression?: No Anxiety?: No  Physical Exam: BP 114/68    Pulse 89    Ht 6' (1.829 m)    Wt 293 lb (132.9 kg)    BMI 39.74 kg/m   Constitutional:  Alert and oriented, No acute distress. HEENT: [place] AT, moist mucus membranes.  Trachea midline, no masses. Cardiovascular: No clubbing, cyanosis, or edema. Respiratory: Normal respiratory effort, no increased work of breathing. GI: Abdomen is soft, nontender, nondistended, no abdominal masses GU: No CVA tenderness Lymph: No cervical or inguinal lymphadenopathy. Skin: No rashes, bruises or suspicious lesions. Neurologic: Grossly intact, no focal deficits, moving all 4 extremities. Psychiatric: Normal mood and affect.  Laboratory Data: Lab Results    Component Value Date   WBC 9.1 08/31/2015   HGB 13.3 10/25/2015   HCT 39.0 10/25/2015   MCV 93.3 08/31/2015   PLT 193 08/31/2015    Lab Results  Component Value Date   CREATININE 2.55 (H) 12/12/2017     Lab Results  Component Value Date   HGBA1C 11.0 (H) 07/08/2015    Urinalysis Pending  Pertinent Imaging: KUB today shows essentially stable left sided 2 cm stone, however, it is somewhat obstructed by a prominent bowel gas pattern.   Assessment & Plan:  1. Left flank pain Acute onset intermittently severe left flank pain for the last 5 days  Given his history of stone disease, I recommended that we get a CT stone protocol today to evaluate for any obstructing stones or stone fragments.  [name] agreeable this plan.  Check urinalysis today to reevaluate his presumed UTI especially in the setting of possible kidney stone and tach.  We will call patient ASAP once we receive the study.  - CT RENAL STONE STUDY (STAT) - Urinalysis, Complete w Microscopic (For [facility] ONLY); Future  2. Kidney stones Personal history of recurrent kidney stones, large nonobstructing left lower pole stone being followed  3. 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 CKD (chronic kidney disease) stage 3, GFR 30-59 ml/min (HCC) Labs drawn last week by PCP, patient will send these to [place] via MyChart   Stat CT scan/UA today-we will call with results and plan  Hollice Espy, MD  [place] [address] [place], [place] [address] [phone]   Addendum: Unfortunately, the radiology department is closed today.  He will come back on Monday for the CT scan.  In the interim, we reviewed carefully reviewed indications for presenting to the emergency room over the weekend including fevers, more severe pain, inability to tolerate p.o., amongst others.  He understands this.  We will call him on Monday with his result.

## 2019-01-30 NOTE — Addendum Note (Signed)
Addended by: Hollice Espy on: 01/30/2019 04:36 PM   Modules accepted: Orders

## 2019-02-02 ENCOUNTER — Telehealth: Payer: Self-pay | Admitting: Urology

## 2019-02-02 ENCOUNTER — Ambulatory Visit: Admission: RE | Admit: 2019-02-02 | Payer: Medicare Other | Source: Ambulatory Visit
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
## 2019-02-06 ENCOUNTER — Other Ambulatory Visit: Payer: Self-pay

## 2019-02-06 ENCOUNTER — Encounter: Payer: Self-pay | Admitting: Emergency Medicine

## 2019-02-06 ENCOUNTER — Emergency Department: Payer: Medicare Other

## 2019-02-06 ENCOUNTER — Emergency Department
Admission: EM | Admit: 2019-02-06 | Discharge: 2019-02-06 | Disposition: A | Discharge status: Home / Self Care | Payer: Medicare Other | Attending: Emergency Medicine | Admitting: Emergency Medicine

## 2019-02-06 DIAGNOSIS — N183 Chronic kidney disease, stage 3 (moderate): Secondary | ICD-10-CM | POA: Insufficient documentation

## 2019-02-06 DIAGNOSIS — Y999 Unspecified external cause status: Secondary | ICD-10-CM | POA: Diagnosis not present

## 2019-02-06 DIAGNOSIS — M545 Low back pain, unspecified: Secondary | ICD-10-CM

## 2019-02-06 DIAGNOSIS — I130 Hypertensive heart and chronic kidney disease with heart failure and stage 1 through stage 4 chronic kidney disease, or unspecified chronic kidney disease: Secondary | ICD-10-CM | POA: Diagnosis not present

## 2019-02-06 DIAGNOSIS — M546 Pain in thoracic spine: Secondary | ICD-10-CM | POA: Diagnosis not present

## 2019-02-06 DIAGNOSIS — S161XXA Strain of muscle, fascia and tendon at neck level, initial encounter: Secondary | ICD-10-CM | POA: Diagnosis not present

## 2019-02-06 DIAGNOSIS — M25572 Pain in left ankle and joints of left foot: Secondary | ICD-10-CM

## 2019-02-06 DIAGNOSIS — E1122 Type 2 diabetes mellitus with diabetic chronic kidney disease: Secondary | ICD-10-CM | POA: Insufficient documentation

## 2019-02-06 DIAGNOSIS — Y939 Activity, unspecified: Secondary | ICD-10-CM | POA: Diagnosis not present

## 2019-02-06 DIAGNOSIS — Y92410 Unspecified street and highway as the place of occurrence of the external cause: Secondary | ICD-10-CM | POA: Diagnosis not present

## 2019-02-06 DIAGNOSIS — H538 Other visual disturbances: Secondary | ICD-10-CM | POA: Insufficient documentation

## 2019-02-06 DIAGNOSIS — R109 Unspecified abdominal pain: Secondary | ICD-10-CM | POA: Diagnosis not present

## 2019-02-06 DIAGNOSIS — N132 Hydronephrosis with renal and ureteral calculous obstruction: Secondary | ICD-10-CM | POA: Diagnosis not present

## 2019-02-06 DIAGNOSIS — I5042 Chronic combined systolic (congestive) and diastolic (congestive) heart failure: Secondary | ICD-10-CM | POA: Diagnosis not present

## 2019-02-06 LAB — COMPREHENSIVE METABOLIC PANEL
ALT: 61 U/L — ABNORMAL HIGH (ref 0–44)
AST: 67 U/L — ABNORMAL HIGH (ref 15–41)
Albumin: 2.7 g/dL — ABNORMAL LOW (ref 3.5–5.0)
Alkaline Phosphatase: 138 U/L — ABNORMAL HIGH (ref 38–126)
Anion gap: 17 — ABNORMAL HIGH (ref 5–15)
BUN: 37 mg/dL — ABNORMAL HIGH (ref 8–23)
CO2: 23 mmol/L (ref 22–32)
Calcium: 8.6 mg/dL — ABNORMAL LOW (ref 8.9–10.3)
Chloride: 94 mmol/L — ABNORMAL LOW (ref 98–111)
Creatinine, Ser: 2.39 mg/dL — ABNORMAL HIGH (ref 0.61–1.24)
GFR calc Af Amer: 32 mL/min — ABNORMAL LOW (ref >60)
GFR calc non Af Amer: 27 mL/min — ABNORMAL LOW (ref >60)
Glucose, Bld: 71 mg/dL (ref 70–99)
Potassium: 3.4 mmol/L — ABNORMAL LOW (ref 3.5–5.1)
Sodium: 134 mmol/L — ABNORMAL LOW (ref 135–145)
Total Bilirubin: 0.9 mg/dL (ref 0.3–1.2)
Total Protein: 7.3 g/dL (ref 6.5–8.1)

## 2019-02-06 LAB — CBC WITH DIFFERENTIAL/PLATELET
Abs Immature Granulocytes: 0.12 10*3/uL — ABNORMAL HIGH (ref 0.00–0.07)
Basophils Absolute: 0 10*3/uL (ref 0.0–0.1)
Basophils Relative: 0 %
Eosinophils Absolute: 0.1 10*3/uL (ref 0.0–0.5)
Eosinophils Relative: 0 %
HCT: 35.5 % — ABNORMAL LOW (ref 39.0–52.0)
Hemoglobin: 12 g/dL — ABNORMAL LOW (ref 13.0–17.0)
Immature Granulocytes: 1 %
Lymphocytes Relative: 10 %
Lymphs Abs: 1.2 10*3/uL (ref 0.7–4.0)
MCH: 31.9 pg (ref 26.0–34.0)
MCHC: 33.8 g/dL (ref 30.0–36.0)
MCV: 94.4 fL (ref 80.0–100.0)
Monocytes Absolute: 1.8 10*3/uL — ABNORMAL HIGH (ref 0.1–1.0)
Monocytes Relative: 14 %
Neutro Abs: 9.6 10*3/uL — ABNORMAL HIGH (ref 1.7–7.7)
Neutrophils Relative %: 75 %
Platelets: 323 10*3/uL (ref 150–400)
RBC: 3.76 MIL/uL — ABNORMAL LOW (ref 4.22–5.81)
RDW: 14.6 % (ref 11.5–15.5)
WBC: 12.8 10*3/uL — ABNORMAL HIGH (ref 4.0–10.5)
nRBC: 0 % (ref 0.0–0.2)

## 2019-02-06 LAB — URINALYSIS, COMPLETE (UACMP) WITH MICROSCOPIC
Bilirubin Urine: NEGATIVE
Glucose, UA: >=500 mg/dL — AB
Ketones, ur: 5 mg/dL — AB
Nitrite: NEGATIVE
Protein, ur: NEGATIVE mg/dL
Specific Gravity, Urine: 1.013 (ref 1.005–1.030)
WBC, UA: >50 WBC/hpf — ABNORMAL HIGH (ref 0–5)
pH: 5 (ref 5.0–8.0)

## 2019-02-06 LAB — LACTIC ACID, PLASMA: Lactic Acid, Venous: 0.8 mmol/L (ref 0.5–1.9)

## 2019-02-06 LAB — TROPONIN I (HIGH SENSITIVITY): Troponin I (High Sensitivity): 111 ng/L (ref <18)

## 2019-02-06 LAB — CK: Total CK: 975 U/L — ABNORMAL HIGH (ref 49–397)
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
## 2019-02-06 MED ORDER — TAMSULOSIN HCL 0.4 MG PO CAPS: 0.4000 mg | ORAL_CAPSULE | Freq: Every day | ORAL | 0 refills | Status: DC

## 2019-02-06 MED ORDER — HYDROCODONE-ACETAMINOPHEN 5-325 MG PO TABS: 1.0000 | ORAL_TABLET | ORAL | 0 refills | Status: DC | PRN

## 2019-02-06 NOTE — Discharge Instructions (Addendum)
Follow-up with your primary care provider if any continued problems or concerns.  Begin taking Norco as needed for moderate to severe pain.  This medication could cause drowsiness and increase your risk for falling.  Be aware and especially at night.  You may use ice or heat to your back as needed for discomfort.  Return to the ED if any severe worsening of your symptoms.  You also have a large stone in your left kidney and a prescription for Flomax was sent to your pharmacy.  Call your urologist first of next week for further instructions.  Return to the emergency department if any severe worsening of your symptoms such as increased pain, nausea, vomiting or fever.

## 2019-02-06 NOTE — ED Notes (Signed)
This RN answered pt's call bell, pt had concerns for infiltrated IV d/t bruising at his knuckles. IV checked, does not appear infiltrated. PT with concerns over getting his daily medicine, will call pharmacy for med rec
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
## 2019-02-06 NOTE — ED Notes (Signed)
Gave pt urinal and reinforced that urine sample is needed. Informed pt that he may take his medication minus the glipizide and eliquis, per [place], [place]

## 2019-02-06 NOTE — ED Provider Notes (Signed)
Patient is seen and examined by me, does have some renal insufficiency and elevated troponin.  Further imaging is pending at this time, anticipate admission for further evaluation.  We have sent a note to his cardiologist.   Earleen Newport, MD 02/06/19 1517
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
## 2019-02-10 ENCOUNTER — Other Ambulatory Visit: Payer: Self-pay

## 2019-02-10 DIAGNOSIS — N200 Calculus of kidney: Secondary | ICD-10-CM

## 2019-02-13 ENCOUNTER — Encounter: Payer: Self-pay | Admitting: Urology

## 2019-02-13 ENCOUNTER — Telehealth: Payer: Self-pay | Admitting: Internal Medicine

## 2019-02-13 ENCOUNTER — Other Ambulatory Visit: Payer: Self-pay

## 2019-02-13 ENCOUNTER — Other Ambulatory Visit
Admission: RE | Admit: 2019-02-13 | Discharge: 2019-02-13 | Disposition: A | Discharge status: Home / Self Care | Payer: Medicare Other | Attending: Urology | Admitting: Urology

## 2019-02-13 ENCOUNTER — Ambulatory Visit (INDEPENDENT_AMBULATORY_CARE_PROVIDER_SITE_OTHER): Payer: Medicare Other | Admitting: Urology

## 2019-02-13 VITALS — BP 120/73 | HR 71

## 2019-02-13 DIAGNOSIS — I2510 Atherosclerotic heart disease of native coronary artery without angina pectoris: Secondary | ICD-10-CM | POA: Diagnosis not present

## 2019-02-13 DIAGNOSIS — N200 Calculus of kidney: Secondary | ICD-10-CM | POA: Diagnosis not present

## 2019-02-13 DIAGNOSIS — N1330 Unspecified hydronephrosis: Secondary | ICD-10-CM

## 2019-02-13 DIAGNOSIS — R109 Unspecified abdominal pain: Secondary | ICD-10-CM | POA: Diagnosis not present

## 2019-02-13 DIAGNOSIS — N183 Chronic kidney disease, stage 3 unspecified: Secondary | ICD-10-CM

## 2019-02-13 DIAGNOSIS — N261 Atrophy of kidney (terminal): Secondary | ICD-10-CM | POA: Diagnosis not present

## 2019-02-13 LAB — URINALYSIS, COMPLETE (UACMP) WITH MICROSCOPIC
Bilirubin Urine: NEGATIVE
Glucose, UA: >1000 mg/dL — AB
Ketones, ur: NEGATIVE mg/dL
Nitrite: NEGATIVE
Protein, ur: NEGATIVE mg/dL
Specific Gravity, Urine: 1.015 (ref 1.005–1.030)
Squamous Epithelial / HPF: NONE SEEN (ref 0–5)
WBC, UA: >50 WBC/hpf (ref 0–5)
pH: 6.5 (ref 5.0–8.0)

## 2019-02-13 NOTE — Telephone Encounter (Signed)
° °  [name] Pre-operative Risk Assessment    Request for surgical clearance:  1. What type of surgery is being performed? Extracorporeal Shockwave Lithotripsy   2. When is this surgery scheduled? 02/19/2019   3. What type of clearance is required (medical clearance vs. Pharmacy clearance to hold med vs. Both)? both  4. Are there any medications that need to be held prior to surgery and how long? Hold Eliquis X 2 days prior to surgery   5. Practice name and name of physician performing surgery? [facility], Dr Erlene Quan   6. What is your office phone number [phone]    7.   What is your office fax number [phone]  8.   Anesthesia type (None, local, MAC, general) ? General    [name] 02/13/2019, 4:39 PM  _________________________________________________________________   (provider comments below)
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
## 2019-02-15 LAB — URINE CULTURE: Culture: NO GROWTH

## 2019-02-16 ENCOUNTER — Other Ambulatory Visit
Admission: RE | Admit: 2019-02-16 | Discharge: 2019-02-16 | Disposition: A | Discharge status: Home / Self Care | Payer: Medicare Other | Source: Ambulatory Visit | Attending: Urology | Admitting: Urology

## 2019-02-16 ENCOUNTER — Other Ambulatory Visit: Payer: Self-pay

## 2019-02-16 DIAGNOSIS — Z20828 Contact with and (suspected) exposure to other viral communicable diseases: Secondary | ICD-10-CM | POA: Diagnosis not present

## 2019-02-16 DIAGNOSIS — Z01812 Encounter for preprocedural laboratory examination: Secondary | ICD-10-CM | POA: Insufficient documentation

## 2019-02-16 NOTE — Telephone Encounter (Signed)
Patient with diagnosis of afib on Eliquis for anticoagulation.    Procedure: Extracorporeal Shockwave Lithotripsy  Date of procedure: 02/19/2019  CHADS2-VASc score of  5 (CHF, HTN, AGE, DM2, CAD)  CrCl 42 ml/min  Per office protocol, patient can hold Eliquis for 2 days prior to procedure.

## 2019-02-16 NOTE — Telephone Encounter (Signed)
Dr. Saunders Revel, pt needs lithotripsy on the 17th,  No angina, but seen in ER with back pain and lying on floor for 5 hours, troponin HS 111 but CK was 950 or so.  EKG was stable.  I think troponin was  Elevated due to stress of being on floor.  He feels fine and no cardiac issues, was going to clear unless you think he needs to be seen first.?  Thanks.

## 2019-02-17 LAB — SARS CORONAVIRUS 2 (TAT 6-24 HRS): SARS Coronavirus 2: NEGATIVE

## 2019-02-18 ENCOUNTER — Encounter: Payer: Self-pay | Admitting: Urology

## 2019-02-18 ENCOUNTER — Other Ambulatory Visit: Payer: Self-pay | Admitting: Radiology

## 2019-02-18 DIAGNOSIS — N200 Calculus of kidney: Secondary | ICD-10-CM

## 2019-02-19 NOTE — Telephone Encounter (Signed)
Patient did not have any unstable symptoms at most recent office visit in June.  Mildly elevated HS-TnI is non-specific.  Given that lithotripsy/cystoscopy are low risk from a cardiac standpoint, I think it would be reasonable for [name] to proceed without additional cardiac testing/intervention as long as his breathing is stable and he does not have any chest pain.  If any new symptoms have developed since June, he will need to be evaluated in the office to discuss the need for additional testing.  I agree that apixaban should be held for 2 days prior to the procedure and restarted as soon as it is safe to do so from a urologic standpoint.  Nelva Bush, MD [facility] Pager: [phone]
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
## 2019-03-02 ENCOUNTER — Other Ambulatory Visit: Admission: RE | Admit: 2019-03-02 | Payer: Medicare Other | Source: Ambulatory Visit

## 2019-03-03 ENCOUNTER — Other Ambulatory Visit
Admission: RE | Admit: 2019-03-03 | Discharge: 2019-03-03 | Disposition: A | Discharge status: Home / Self Care | Payer: Medicare Other | Source: Ambulatory Visit | Attending: Urology | Admitting: Urology

## 2019-03-03 DIAGNOSIS — Z20828 Contact with and (suspected) exposure to other viral communicable diseases: Secondary | ICD-10-CM | POA: Diagnosis not present

## 2019-03-03 DIAGNOSIS — Z01812 Encounter for preprocedural laboratory examination: Secondary | ICD-10-CM | POA: Insufficient documentation

## 2019-03-03 LAB — SARS CORONAVIRUS 2 (TAT 6-24 HRS): SARS Coronavirus 2: NEGATIVE

## 2019-03-05 ENCOUNTER — Other Ambulatory Visit: Payer: Self-pay

## 2019-03-05 ENCOUNTER — Ambulatory Visit: Payer: Medicare Other | Admitting: Anesthesiology

## 2019-03-05 ENCOUNTER — Encounter: Payer: Self-pay | Admitting: Anesthesiology

## 2019-03-05 ENCOUNTER — Ambulatory Visit
Admission: RE | Admit: 2019-03-05 | Discharge: 2019-03-05 | Disposition: A | Discharge status: Home / Self Care | Payer: Medicare Other | Attending: Urology | Admitting: Urology

## 2019-03-05 ENCOUNTER — Ambulatory Visit: Payer: Medicare Other

## 2019-03-05 ENCOUNTER — Encounter
Admission: RE | Disposition: A | Discharge status: Home / Self Care | Payer: Self-pay | Source: Home / Self Care | Attending: Urology

## 2019-03-05 DIAGNOSIS — I272 Pulmonary hypertension, unspecified: Secondary | ICD-10-CM | POA: Insufficient documentation

## 2019-03-05 DIAGNOSIS — I480 Paroxysmal atrial fibrillation: Secondary | ICD-10-CM | POA: Diagnosis not present

## 2019-03-05 DIAGNOSIS — Z955 Presence of coronary angioplasty implant and graft: Secondary | ICD-10-CM | POA: Diagnosis not present

## 2019-03-05 DIAGNOSIS — N183 Chronic kidney disease, stage 3 unspecified: Secondary | ICD-10-CM | POA: Diagnosis not present

## 2019-03-05 DIAGNOSIS — G4730 Sleep apnea, unspecified: Secondary | ICD-10-CM | POA: Insufficient documentation

## 2019-03-05 DIAGNOSIS — N200 Calculus of kidney: Secondary | ICD-10-CM

## 2019-03-05 DIAGNOSIS — E1122 Type 2 diabetes mellitus with diabetic chronic kidney disease: Secondary | ICD-10-CM | POA: Insufficient documentation

## 2019-03-05 DIAGNOSIS — I2510 Atherosclerotic heart disease of native coronary artery without angina pectoris: Secondary | ICD-10-CM | POA: Insufficient documentation

## 2019-03-05 DIAGNOSIS — N132 Hydronephrosis with renal and ureteral calculous obstruction: Secondary | ICD-10-CM | POA: Diagnosis present

## 2019-03-05 DIAGNOSIS — I252 Old myocardial infarction: Secondary | ICD-10-CM | POA: Insufficient documentation

## 2019-03-05 DIAGNOSIS — Z87442 Personal history of urinary calculi: Secondary | ICD-10-CM | POA: Diagnosis not present

## 2019-03-05 DIAGNOSIS — E669 Obesity, unspecified: Secondary | ICD-10-CM | POA: Insufficient documentation

## 2019-03-05 DIAGNOSIS — I130 Hypertensive heart and chronic kidney disease with heart failure and stage 1 through stage 4 chronic kidney disease, or unspecified chronic kidney disease: Secondary | ICD-10-CM | POA: Insufficient documentation

## 2019-03-05 DIAGNOSIS — Z7901 Long term (current) use of anticoagulants: Secondary | ICD-10-CM | POA: Insufficient documentation

## 2019-03-05 DIAGNOSIS — I5042 Chronic combined systolic (congestive) and diastolic (congestive) heart failure: Secondary | ICD-10-CM | POA: Diagnosis not present

## 2019-03-05 DIAGNOSIS — Z79899 Other long term (current) drug therapy: Secondary | ICD-10-CM | POA: Diagnosis not present

## 2019-03-05 DIAGNOSIS — Z794 Long term (current) use of insulin: Secondary | ICD-10-CM | POA: Insufficient documentation

## 2019-03-05 HISTORY — PX: EXTRACORPOREAL SHOCK WAVE LITHOTRIPSY: SHX1557

## 2019-03-05 IMAGING — CR DG ABDOMEN 1V
2 series · 2 of 2 positions shown · non-contrast
Comparison: CT, [DATE].  Radiographs, [DATE].

CLINICAL DATA: Left nephrolithiasis, morning of procedure

EXAM:
ABDOMEN - 1 VIEW

[abdomen kub (1 of 2)]
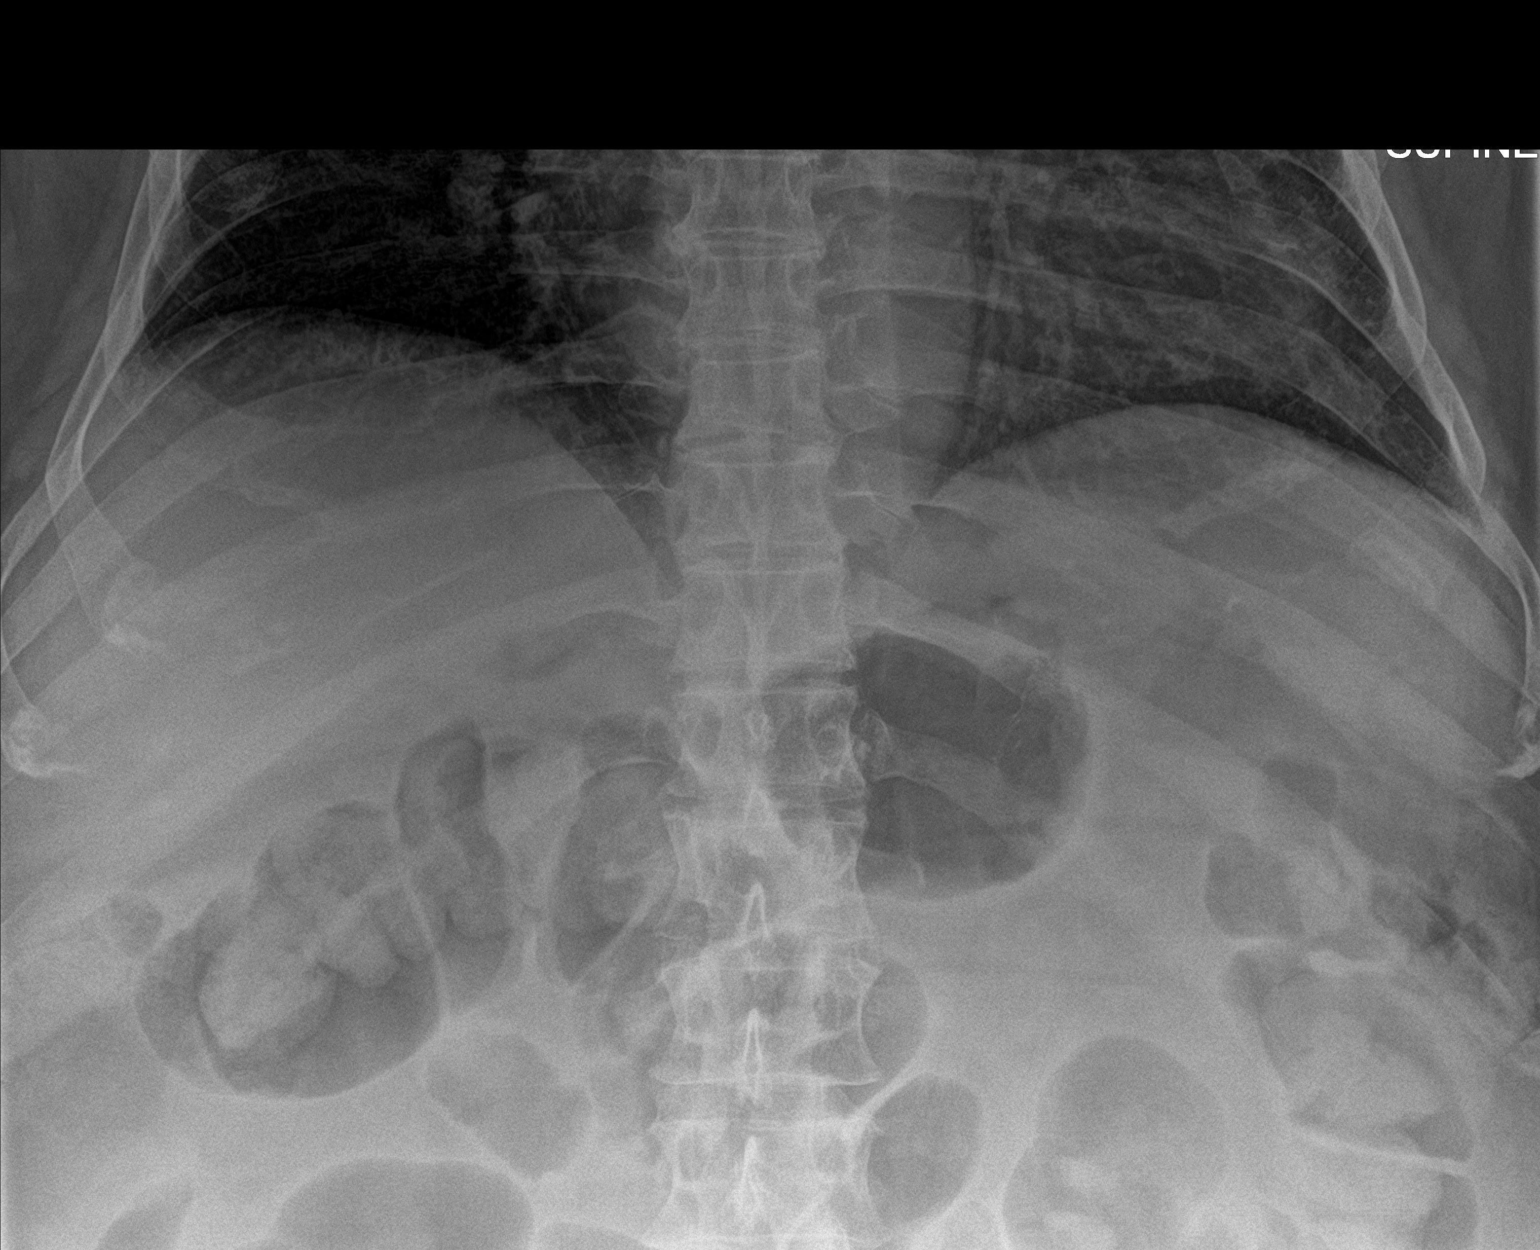

[abdomen kub (2 of 2)]
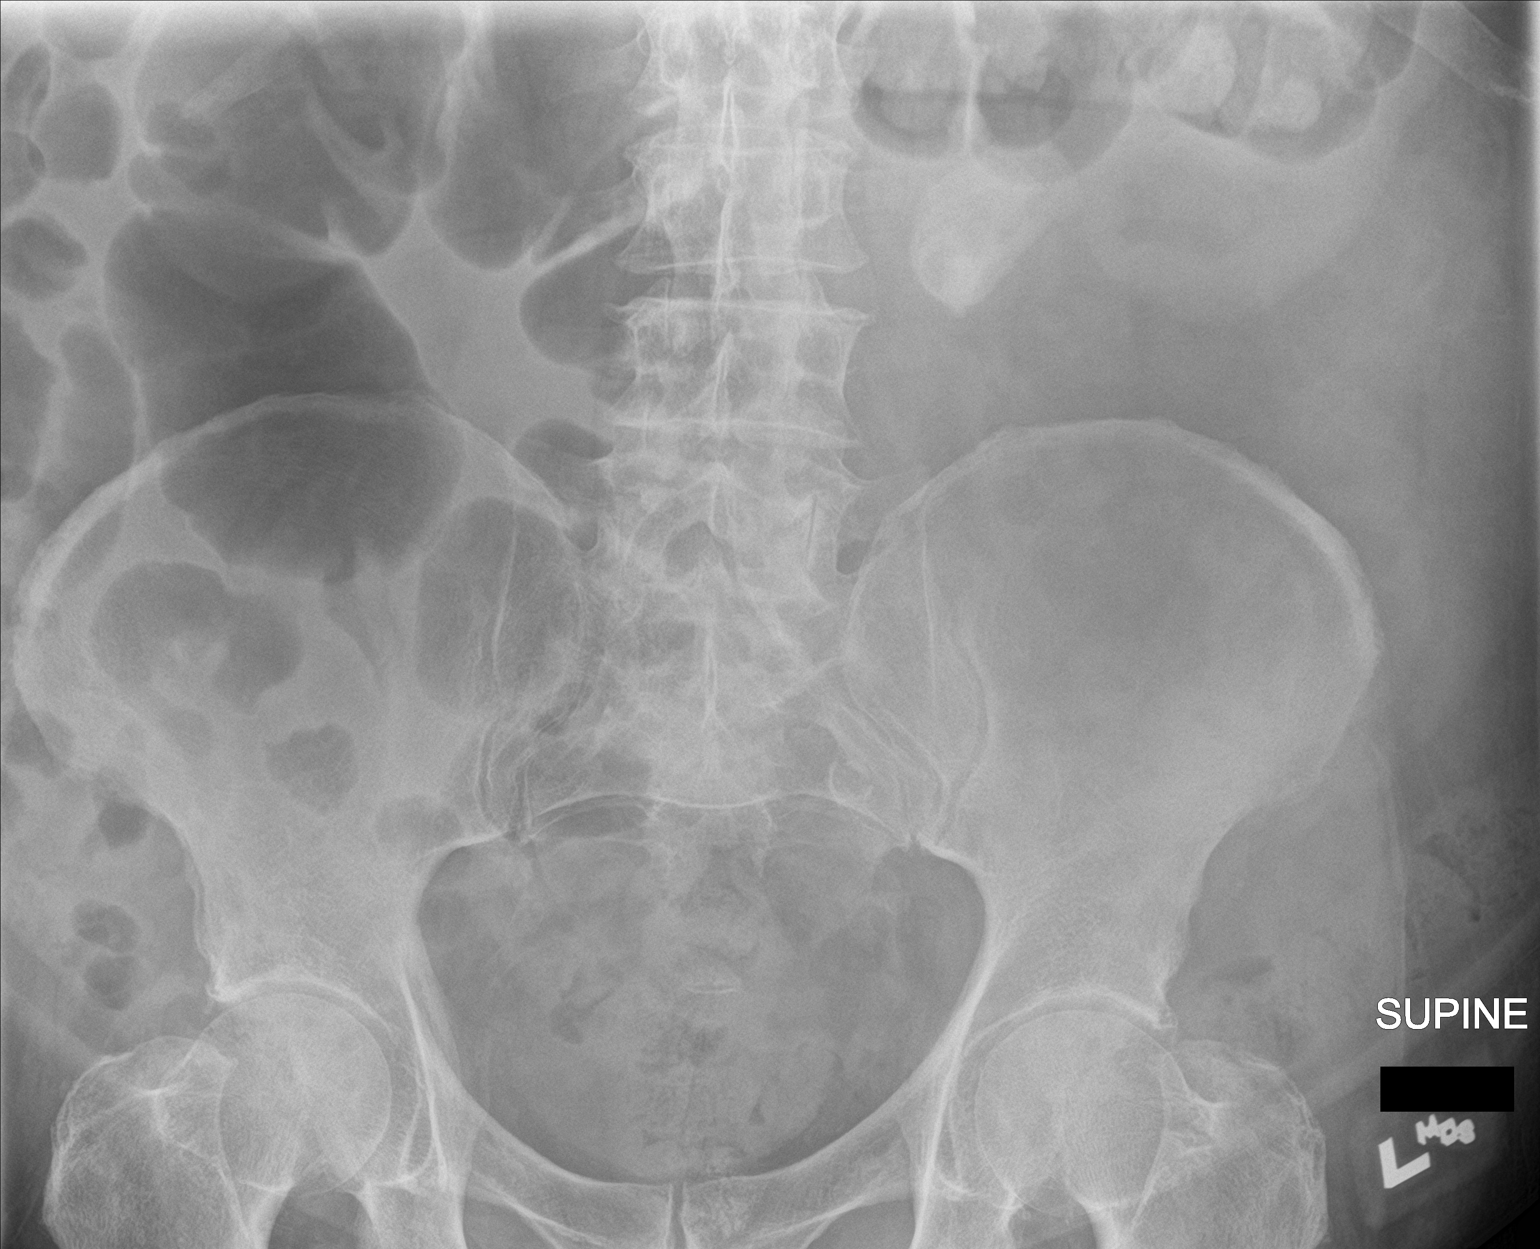

[2 of 2 positions shown; findings below may reference images not displayed]

FINDINGS: Large stone spanning 5.3 cm, lies to the left of L3, projecting
adjacent to the lower pole of the left kidney, reflecting the stone
noted in the distal renal pelvis to proximal ureter on the prior CT.
No other visualized renal or ureteral stones.

Normal bowel gas pattern.
IMPRESSION: 1. Stable position of the large stone projecting in the a inferior
left renal pelvis to proximal ureter, measuring 5.3 cm
radiographically.

## 2019-03-05 SURGERY — LITHOTRIPSY, ESWL
Anesthesia: General | Laterality: Left

## 2019-03-05 MED ORDER — DIAZEPAM 5 MG PO TABS
10.0000 mg | ORAL_TABLET | ORAL | Status: AC
  Administered 2019-03-05: 13:00:00 10 mg via ORAL

## 2019-03-05 MED ORDER — PROPOFOL 500 MG/50ML IV EMUL
INTRAVENOUS | Status: AC
  Filled 2019-03-05: qty 50

## 2019-03-05 MED ORDER — DIPHENHYDRAMINE HCL 25 MG PO CAPS
25.0000 mg | ORAL_CAPSULE | ORAL | Status: AC
  Administered 2019-03-05: 13:00:00 25 mg via ORAL

## 2019-03-05 MED ORDER — FENTANYL CITRATE (PF) 100 MCG/2ML IJ SOLN
INTRAMUSCULAR | Status: DC | PRN
  Administered 2019-03-05: 25 ug via INTRAVENOUS
  Administered 2019-03-05: 50 ug via INTRAVENOUS
  Administered 2019-03-05: 25 ug via INTRAVENOUS

## 2019-03-05 MED ORDER — CIPROFLOXACIN HCL 500 MG PO TABS
ORAL_TABLET | ORAL | Status: AC
  Filled 2019-03-05: qty 1

## 2019-03-05 MED ORDER — DIAZEPAM 5 MG PO TABS
ORAL_TABLET | ORAL | Status: AC
  Administered 2019-03-05: 10 mg via ORAL
  Filled 2019-03-05: qty 2

## 2019-03-05 MED ORDER — DIPHENHYDRAMINE HCL 25 MG PO CAPS
ORAL_CAPSULE | ORAL | Status: AC
  Administered 2019-03-05: 25 mg via ORAL
  Filled 2019-03-05: qty 1

## 2019-03-05 MED ORDER — ONDANSETRON HCL 4 MG/2ML IJ SOLN
4.0000 mg | Freq: Once | INTRAMUSCULAR | Status: AC | PRN
  Administered 2019-03-05: 13:00:00 4 mg via INTRAVENOUS

## 2019-03-05 MED ORDER — PROPOFOL 10 MG/ML IV BOLUS
INTRAVENOUS | Status: DC | PRN
  Administered 2019-03-05: 20 mg via INTRAVENOUS

## 2019-03-05 MED ORDER — DOCUSATE SODIUM 100 MG PO CAPS: 100.0000 mg | ORAL_CAPSULE | Freq: Two times a day (BID) | ORAL | 0 refills | Status: DC

## 2019-03-05 MED ORDER — OXYCODONE-ACETAMINOPHEN 5-325 MG PO TABS: 1.0000 | ORAL_TABLET | ORAL | 0 refills | Status: DC | PRN

## 2019-03-05 MED ORDER — SODIUM CHLORIDE 0.9 % IV SOLN
INTRAVENOUS | Status: DC
  Administered 2019-03-05: 13:00:00 via INTRAVENOUS

## 2019-03-05 MED ORDER — MIDAZOLAM HCL 2 MG/2ML IJ SOLN
INTRAMUSCULAR | Status: DC | PRN
  Administered 2019-03-05: 1 mg via INTRAVENOUS

## 2019-03-05 MED ORDER — CIPROFLOXACIN HCL 500 MG PO TABS
ORAL_TABLET | ORAL | Status: AC
  Administered 2019-03-05: 500 mg via ORAL
  Filled 2019-03-05: qty 1

## 2019-03-05 MED ORDER — MIDAZOLAM HCL 2 MG/2ML IJ SOLN
INTRAMUSCULAR | Status: AC
  Filled 2019-03-05: qty 2

## 2019-03-05 MED ORDER — ONDANSETRON HCL 4 MG/2ML IJ SOLN
INTRAMUSCULAR | Status: AC
  Administered 2019-03-05: 13:00:00 4 mg via INTRAVENOUS
  Filled 2019-03-05: qty 2

## 2019-03-05 MED ORDER — FENTANYL CITRATE (PF) 100 MCG/2ML IJ SOLN
INTRAMUSCULAR | Status: AC
  Filled 2019-03-05: qty 2

## 2019-03-05 MED ORDER — CIPROFLOXACIN HCL 500 MG PO TABS
500.0000 mg | ORAL_TABLET | ORAL | Status: AC
  Administered 2019-03-05: 13:00:00 500 mg via ORAL

## 2019-03-05 NOTE — Anesthesia Preprocedure Evaluation (Signed)
Anesthesia Evaluation  Patient identified by MRN, date of birth, ID band Patient awake    Reviewed: Allergy & Precautions, H&P , NPO status , Patient's Chart, lab work & pertinent test results  History of Anesthesia Complications Negative for: history of anesthetic complications  Airway Mallampati: III  TM Distance: <3 FB Neck ROM: limited    Dental  (+) Chipped, Poor Dentition, Missing   Pulmonary neg shortness of breath, sleep apnea ,           Cardiovascular Exercise Tolerance: Good hypertension, (-) angina+ CAD, + Past MI and +CHF  + dysrhythmias      Neuro/Psych negative neurological ROS  negative psych ROS   GI/Hepatic negative GI ROS, Neg liver ROS,   Endo/Other  diabetes, Type 2  Renal/GU Renal disease  negative genitourinary   Musculoskeletal   Abdominal   Peds  Hematology negative hematology ROS (+)   Anesthesia Other Findings Past Medical History: No date: Cardiomyopathy ([place]) No date: Chronic combined systolic (congestive) and diastolic  (congestive) heart failure (HCC)     Comment:  a. EF 25% by cath in 2013 b. Echo in 07/2015 showing EF               of 15-20%, moderate MR, moderate Pulm HTN, severely               dilated IVC No date: CKD (chronic kidney disease) stage 3, GFR 30-59 ml/min No date: Coronary artery disease No date: Diabetes mellitus type 2, uncontrolled (HCC)     Comment:  a. A1c 11.0 in 07/2015. No date: Hypertension No date: Kidney stones     Comment:  Left No date: New onset atrial fibrillation (HCC)     Comment:  a. diagnosed in 07/2015 b. started on Eliquis No date: Paroxysmal atrial fibrillation ([place]) 09-21-15: Pollen allergies     Comment:  pt called and stated that he woke up and had some               drainage-pt states he did not see the color of the               drainage-pt denies running a fever and this only happened              once-pt instructed to call Dr  Audree Bane office if he               starts running a fever or if the color of drainage               becomes yellow/green No date: Psoriasis No date: Pulmonary hypertension ([place] [place]) No date: Renal disorder     Comment:  kidney stone No date: Sleep apnea  Past Surgical History: 07/25/2015: CARDIAC CATHETERIZATION; N/A     Comment:  Procedure: Right/Left Heart Cath and Coronary               Angiography;  Surgeon: [place] Hampshire, MD;  Location:               [facility];  Service: Cardiovascular;                Laterality: N/A; No date: CARDIAC CATHETERIZATION     Comment:  [place] No date: CARDIAC CATHETERIZATION     Comment:  [place], [place] No date: CORONARY ANGIOPLASTY WITH STENT PLACEMENT 09/07/2015: [place]; Left     Comment:  Procedure: CYSTOSCOPY WITH STENT PLACEMENT;  Surgeon:  Hollice Espy, MD;  Location: [facility];  Service:               Urology;  Laterality: Left; 10/25/2015: CYSTOSCOPY/URETEROSCOPY/HOLMIUM LASER/STENT PLACEMENT; Left     Comment:  Procedure: CYSTOSCOPY/URETEROSCOPY/HOLMIUM LASER/STENT               EXCHANGE;  Surgeon: Hollice Espy, MD;  Location: [facility]               ORS;  Service: Urology;  Laterality: Left; No date: KIDNEY SURGERY No date: NEPHROSTOMY TUBE PLACEMENT ([place] HX); Left 09/07/2015: URETEROSCOPY WITH HOLMIUM LASER LITHOTRIPSY; Left     Comment:  Procedure: URETEROSCOPY WITH HOLMIUM LASER LITHOTRIPSY;               Surgeon: Hollice Espy, MD;  Location: [facility];                Service: Urology;  Laterality: Left;     Reproductive/Obstetrics negative OB ROS                             Anesthesia Physical Anesthesia Plan  ASA: III  Anesthesia Plan: General   Post-op Pain Management:    Induction: Intravenous  PONV Risk Score and Plan: Propofol infusion and TIVA  Airway Management Planned: Natural Airway and Nasal Cannula  Additional Equipment:    Intra-op Plan:   Post-operative Plan:   Informed Consent: I have reviewed the patients History and Physical, chart, labs and discussed the procedure including the risks, benefits and alternatives for the proposed anesthesia with the patient or authorized representative who has indicated his/her understanding and acceptance.     Dental Advisory Given  Plan Discussed with: Anesthesiologist, CRNA and Surgeon  Anesthesia Plan Comments: (Patient consented for risks of anesthesia including but not limited to:  - adverse reactions to medications - risk of intubation if required - damage to teeth, lips or other oral mucosa - sore throat or hoarseness - Damage to heart, brain, lungs or loss of life  Patient voiced understanding.)        Anesthesia Quick Evaluation

## 2019-03-05 NOTE — Anesthesia Procedure Notes (Signed)
Procedure Name: MAC Date/Time: 03/05/2019 2:09 PM Performed by: Johnna Acosta, CRNA Pre-anesthesia Checklist: Patient identified, Emergency Drugs available, Suction available, Patient being monitored and Timeout performed Patient Re-evaluated:Patient Re-evaluated prior to induction Oxygen Delivery Method: Nasal cannula Preoxygenation: Pre-oxygenation with 100% oxygen Induction Type: IV induction

## 2019-03-05 NOTE — Anesthesia Post-op Follow-up Note (Signed)
Anesthesia QCDR form completed.        

## 2019-03-05 NOTE — Discharge Instructions (Signed)
AMBULATORY SURGERY  DISCHARGE INSTRUCTIONS   1) The drugs that you were given will stay in your system until tomorrow so for the next 24 hours you should not:  A) Drive an automobile B) Make any legal decisions C) Drink any alcoholic beverage   2) You may resume regular meals tomorrow.  Today it is better to start with liquids and gradually work up to solid foods.  You may eat anything you prefer, but it is better to start with liquids, then soup and crackers, and gradually work up to solid foods.   3) Please notify your doctor immediately if you have any unusual bleeding, trouble breathing, redness and pain at the surgery site, drainage, fever, or pain not relieved by medication.    4) Additional Instructions: Follow discharge instructions from Dr Audree Bane instructions       Please contact your physician with any problems or Same Day Surgery at [phone], Monday through Friday 6 am to 4 pm, or [place] at [facility] number at [phone].See [facility] discharge instructions in chart.

## 2019-03-05 NOTE — Transfer of Care (Signed)
Immediate Anesthesia Transfer of Care Note  Patient: [name]  Procedure(s) Performed: EXTRACORPOREAL SHOCK WAVE LITHOTRIPSY (ESWL) (Left )  Patient Location: PACU and Short Stay  Anesthesia Type:General  Level of Consciousness: awake, alert  and oriented  Airway & Oxygen Therapy: Patient Spontanous Breathing  Post-op Assessment: Report given to RN and Post -op Vital signs reviewed and stable  Post vital signs: Reviewed and stable  Last Vitals:  Vitals Value Taken Time  BP 105/62 03/05/19 1600  Temp 36.1 C 03/05/19 1600  Pulse 71 03/05/19 1600  Resp 20 03/05/19 1600  SpO2 94 % 03/05/19 1600    Last Pain:  Vitals:   03/05/19 1600  TempSrc: Temporal  PainSc: 0-No pain         Complications: No apparent anesthesia complications

## 2019-03-06 ENCOUNTER — Encounter: Payer: Self-pay | Admitting: Urology

## 2019-03-06 NOTE — Anesthesia Postprocedure Evaluation (Signed)
Anesthesia Post Note  Patient: [name]  Procedure(s) Performed: EXTRACORPOREAL SHOCK WAVE LITHOTRIPSY (ESWL) (Left )  Patient location during evaluation: PACU Anesthesia Type: General Level of consciousness: awake and alert Pain management: pain level controlled Vital Signs Assessment: post-procedure vital signs reviewed and stable Respiratory status: spontaneous breathing, nonlabored ventilation, respiratory function stable and patient connected to nasal cannula oxygen Cardiovascular status: blood pressure returned to baseline and stable Postop Assessment: no apparent nausea or vomiting Anesthetic complications: no     Last Vitals:  Vitals:   03/05/19 1238 03/05/19 1600  BP: 112/67 105/62  Pulse: 74 71  Resp: 14 20  Temp: 36.6 C (!) 36.1 C  SpO2: 100% 94%    Last Pain:  Vitals:   03/06/19 0821  TempSrc:   PainSc: 0-No pain                 Precious Haws Boluwatife Mutchler

## 2019-03-10 ENCOUNTER — Telehealth: Payer: Self-pay | Admitting: Urology

## 2019-03-10 NOTE — Telephone Encounter (Signed)
Call to discuss degree of hematuria.  Passing fragments? Only hold if clots or not emptying.  Hollice Espy, MD

## 2019-03-10 NOTE — Telephone Encounter (Signed)
Patient states hematuria has improved-denies clots or fragments. Will call the office if symptoms worsen. Verbalized understanding.

## 2019-03-10 NOTE — Telephone Encounter (Signed)
Pt. States he was straining 2-3 days ago and is now having blood in his urine every time he goes to empty his bladder.   Pt. Also wants advice on Eliquis and wants to know if he should stop eliquis for a few days because of the bleeding.

## 2019-03-20 ENCOUNTER — Ambulatory Visit: Payer: Medicare Other | Admitting: Urology

## 2019-03-23 ENCOUNTER — Other Ambulatory Visit: Payer: Self-pay

## 2019-03-23 ENCOUNTER — Ambulatory Visit (INDEPENDENT_AMBULATORY_CARE_PROVIDER_SITE_OTHER): Payer: Medicare Other | Admitting: Urology

## 2019-03-23 ENCOUNTER — Ambulatory Visit
Admission: RE | Admit: 2019-03-23 | Discharge: 2019-03-23 | Disposition: A | Discharge status: Home / Self Care | Payer: Medicare Other | Attending: Urology | Admitting: Urology

## 2019-03-23 ENCOUNTER — Encounter: Payer: Self-pay | Admitting: Urology

## 2019-03-23 ENCOUNTER — Ambulatory Visit
Admission: RE | Admit: 2019-03-23 | Discharge: 2019-03-23 | Disposition: A | Discharge status: Home / Self Care | Payer: Medicare Other | Source: Ambulatory Visit | Attending: Urology | Admitting: Urology

## 2019-03-23 VITALS — BP 107/64 | HR 77 | Ht 72.0 in | Wt 277.0 lb

## 2019-03-23 DIAGNOSIS — N200 Calculus of kidney: Secondary | ICD-10-CM

## 2019-03-23 DIAGNOSIS — N261 Atrophy of kidney (terminal): Secondary | ICD-10-CM

## 2019-03-23 DIAGNOSIS — N201 Calculus of ureter: Secondary | ICD-10-CM

## 2019-03-23 IMAGING — CR DG ABDOMEN 1V
2 series · 2 of 2 positions shown · non-contrast
Comparison: [DATE]

CLINICAL DATA: Recent lithotripsy on the left

EXAM:
ABDOMEN - 1 VIEW

[abdomen kub (1 of 2)]
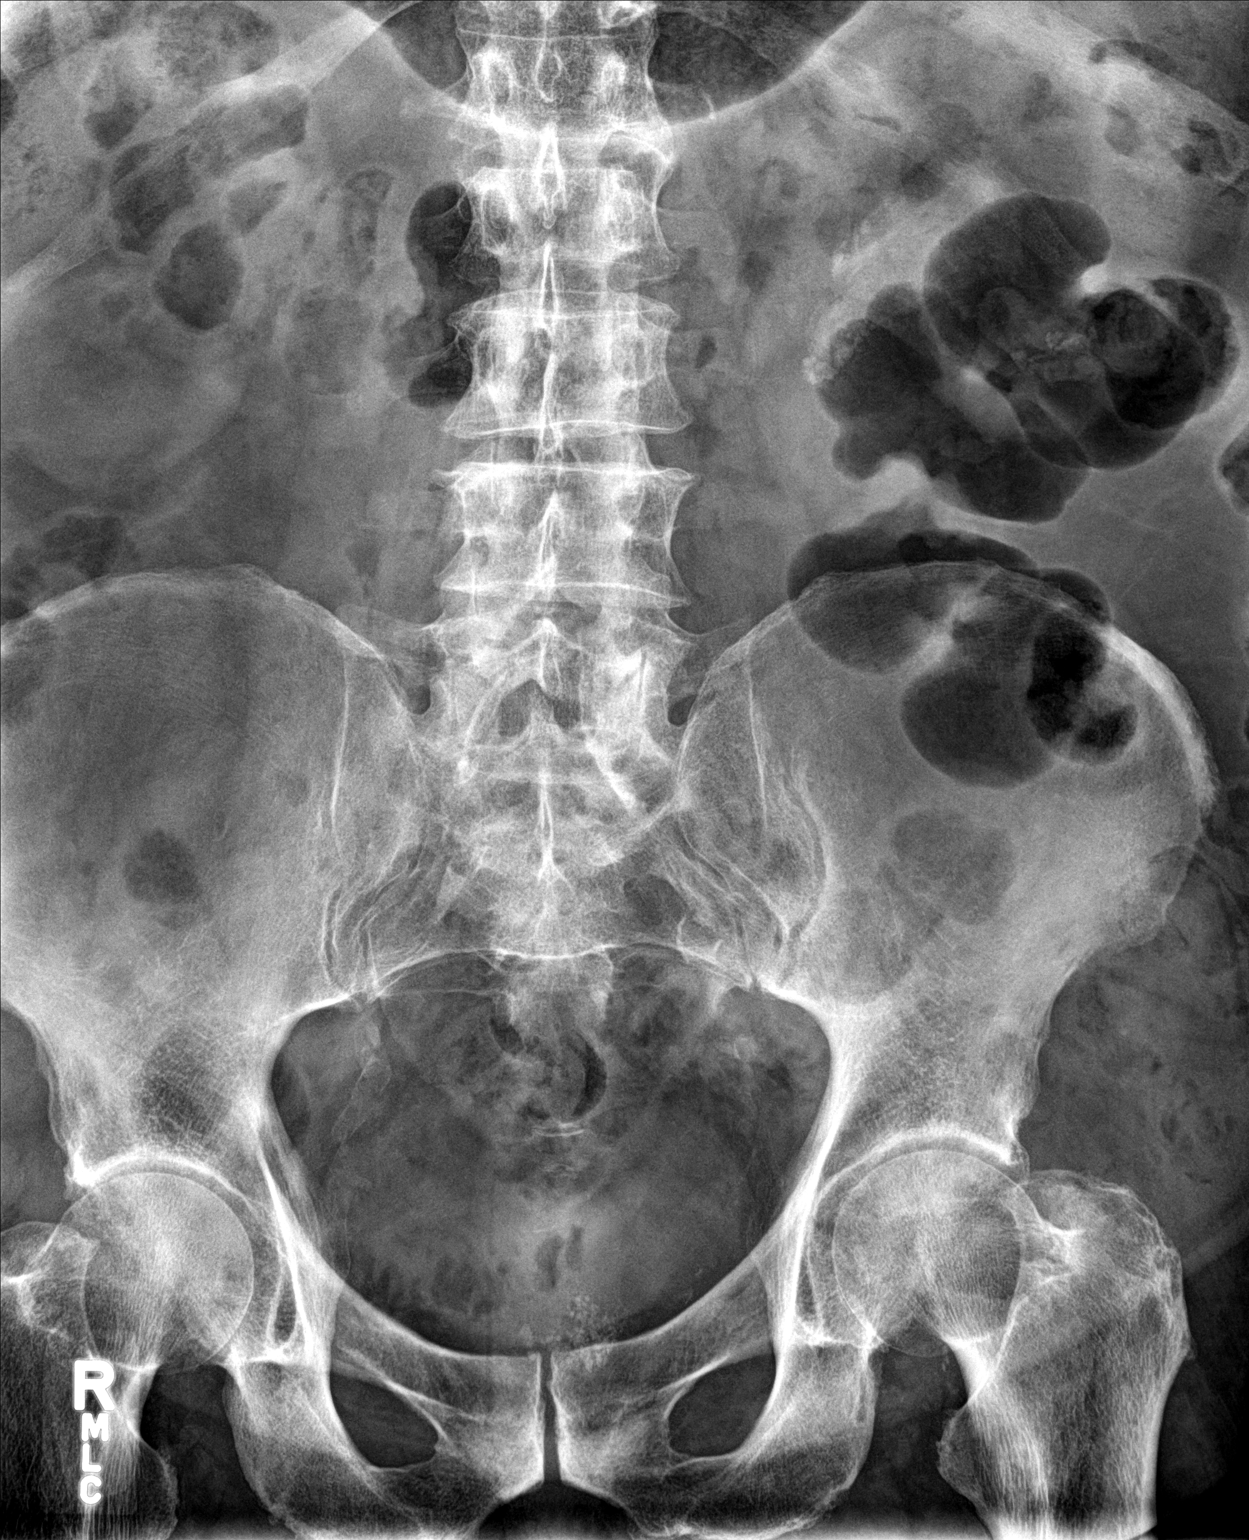

[abdomen kub (2 of 2)]
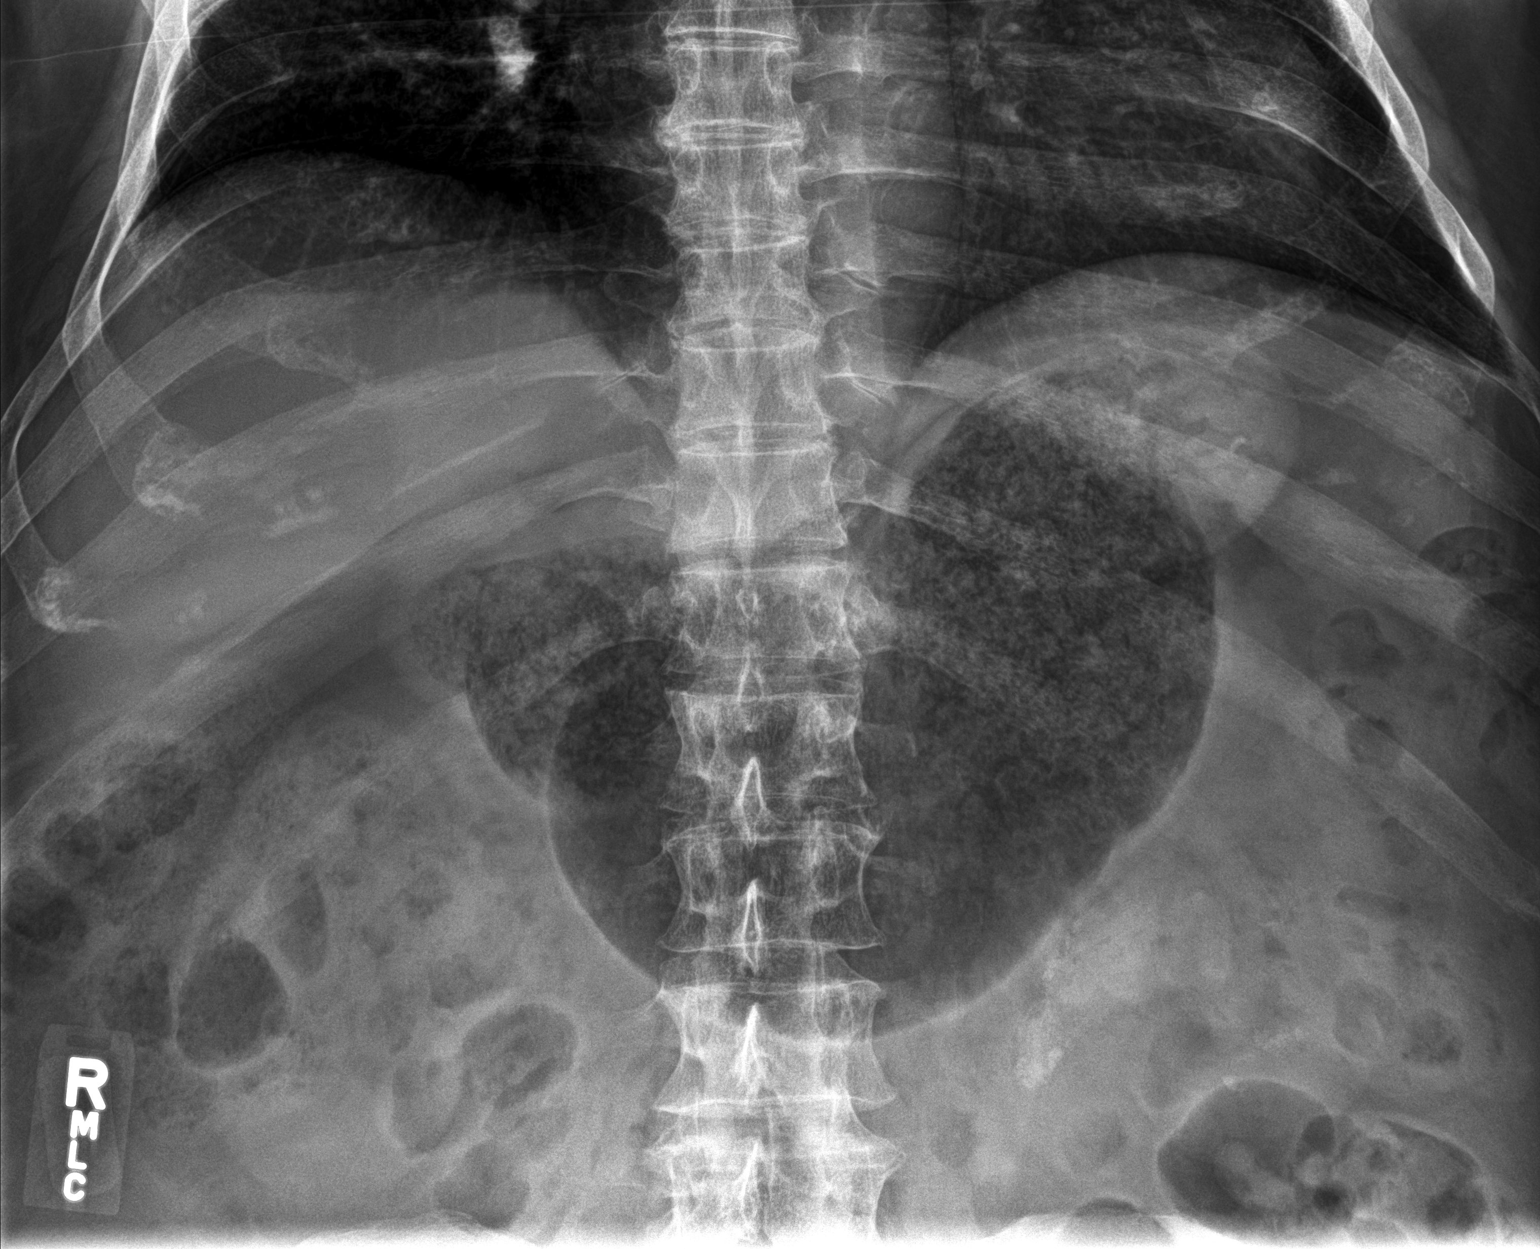

[2 of 2 positions shown; findings below may reference images not displayed]

FINDINGS: Scattered large and small bowel gas is noted. Multiple small stone
fragments are noted within the collecting system and renal pelvis on
the left consistent with the recent lithotripsy. The largest of
these fragments measures approximately 8 mm. No definitive ureteral
stones are seen at this time. Prosthetic calcifications are noted.
IMPRESSION: Interval lithotripsy on the left with multiple stone fragments
identified within the left renal collecting system as described.

## 2019-03-23 NOTE — H&P (View-Only) (Signed)
03/23/2019 3:02 PM   [name] [birth year] [id number]  Referring provider: Vidal Schwalbe, MD [address] [place],  [place] [address]  Chief Complaint  Patient presents with  . Nephrolithiasis    post op     HPI: Complex 68 year old male with personal history of nephrolithiasis who returns today following left ESWL on 03/05/2019 for a large 3 cm UPJ/proximal ureteral stone.  Please see previous notes for explanation of decision making.  To the density of the stone which is relatively low, he elected to proceed with left ESWL.  The procedure was uncomplicated.  He reports today the bleeding for a day or 2 after the procedure but this resolved spontaneously.  It was not severe.  He reports that he passed innumerable fragments and only strain his urine for the first few days.  He brings a jar which has a copious amount of fragments consistent with fractionated stone material with him today.  Overall he is feeling well.  He tolerated the procedure well.  BB today as below.  He denies any flank pain.  No urinary symptoms.  No further gross hematuria.   PMH: Past Medical History:  Diagnosis Date  . Cardiomyopathy ([place])   . Chronic combined systolic (congestive) and diastolic (congestive) heart failure (HCC)    a. EF 25% by cath in 2013 b. Echo in 07/2015 showing EF of 15-20%, moderate MR, moderate Pulm HTN, severely dilated IVC  . CKD (chronic kidney disease) stage 3, GFR 30-59 ml/min   . Coronary artery disease   . Diabetes mellitus type 2, uncontrolled ([place])    a. A1c 11.0 in 07/2015.  [name] Hypertension   . Kidney stones    Left  . New onset atrial fibrillation ([place])    a. diagnosed in 07/2015 b. started on Eliquis  . Paroxysmal atrial fibrillation (HCC)   . Pollen allergies 09-21-15   pt called and stated that he woke up and had some drainage-pt states he did not see the color of the drainage-pt denies running a fever and this only happened once-pt instructed to call Dr  Audree Bane office if he starts running a fever or if the color of drainage becomes yellow/green  . Psoriasis   . Pulmonary hypertension ([place])   . Renal disorder    kidney stone  . Sleep apnea     Surgical History: Past Surgical History:  Procedure Laterality Date  . CARDIAC CATHETERIZATION N/A 07/25/2015   Procedure: Right/Left Heart Cath and Coronary Angiography;  Surgeon: [place] Hampshire, MD;  Location: [facility];  Service: Cardiovascular;  Laterality: N/A;  . CARDIAC [place]     [place]  . CARDIAC CATHETERIZATION     [facility], [place]    . CYSTOSCOPY WITH STENT PLACEMENT Left 09/07/2015   Procedure: CYSTOSCOPY WITH STENT PLACEMENT;  Surgeon: Hollice Espy, MD;  Location: [facility];  Service: Urology;  Laterality: Left;  . CYSTOSCOPY/URETEROSCOPY/HOLMIUM LASER/STENT PLACEMENT Left 10/25/2015   Procedure: CYSTOSCOPY/URETEROSCOPY/HOLMIUM LASER/STENT EXCHANGE;  Surgeon: Hollice Espy, MD;  Location: [facility];  Service: Urology;  Laterality: Left;  . EXTRACORPOREAL SHOCK WAVE LITHOTRIPSY Left 03/05/2019   Procedure: EXTRACORPOREAL SHOCK WAVE LITHOTRIPSY (ESWL);  Surgeon: Abbie Sons, MD;  Location: [facility];  Service: Urology;  Laterality: Left;  . KIDNEY SURGERY    . NEPHROSTOMY TUBE PLACEMENT ([place] HX) Left   . URETEROSCOPY WITH HOLMIUM LASER LITHOTRIPSY Left 09/07/2015   Procedure: URETEROSCOPY WITH HOLMIUM LASER LITHOTRIPSY;  Surgeon:  Hollice Espy, MD;  Location: [facility];  Service: Urology;  Laterality: Left;    Home Medications:  Allergies as of 03/23/2019   No Known Allergies     Medication List       Accurate as of March 23, 2019 11:59 PM. If you have any questions, ask your nurse or doctor.        amiodarone 200 MG tablet Commonly known as: PACERONE Take 1 tablet (200 mg total) by mouth daily. What changed: when to take this   apixaban 5 MG Tabs tablet Commonly known as: Eliquis Take 1  tablet (5 mg total) 2 (two) times daily by mouth.   b complex vitamins tablet Take 1 tablet by mouth daily.   baclofen 10 MG tablet Commonly known as: LIORESAL Take 10 mg by mouth daily.   carvedilol 6.25 MG tablet Commonly known as: COREG TAKE 1 TABLET BY MOUTH TWICE DAILY   clonazePAM 1 MG tablet Commonly known as: KLONOPIN   CO Q 10 PO Take 400 mg by mouth daily.   Cranberry 500 MG Caps Take 1,000 mg by mouth daily.   docusate sodium 100 MG capsule Commonly known as: COLACE Take 1 capsule (100 mg total) by mouth 2 (two) times daily.   furosemide 80 MG tablet Commonly known as: LASIX Take 1 tablet (80 mg total) by mouth daily.   glipiZIDE 10 MG tablet Commonly known as: GLUCOTROL Take 10 mg by mouth 2 (two) times daily.   Hawthorne Berry 550 MG Caps Take 550 mg by mouth daily.   HYDROcodone-acetaminophen 5-325 MG tablet Commonly known as: NORCO/VICODIN Take 1 tablet by mouth every 4 (four) hours as needed for moderate pain.   Jardiance 10 MG Tabs tablet Generic drug: empagliflozin 10 mg daily.   losartan 25 MG tablet Commonly known as: COZAAR Take 25 mg by mouth daily.   montelukast 10 MG tablet Commonly known as: SINGULAIR   Multi-Vitamins Tabs Take by mouth.   multivitamin-lutein Caps capsule Take 1 capsule by mouth 2 (two) times daily.   OVER THE COUNTER MEDICATION Take 3 capsules by mouth 2 (two) times daily. Reported on 12/23/2015   OVER THE COUNTER MEDICATION Take 1 capsule by mouth daily. Pt takes caprylic acid.   oxyCODONE-acetaminophen 5-325 MG tablet Commonly known as: Percocet Take 1-2 tablets by mouth every 4 (four) hours as needed for moderate pain or severe pain.   Ozempic (1 MG/DOSE) 2 MG/1.5ML Sopn Generic drug: Semaglutide (1 MG/DOSE)   polyethylene glycol 17 g packet Commonly known as: MIRALAX / GLYCOLAX Take 17 g by mouth daily as needed for mild constipation.   potassium phosphate (monobasic) 500 MG tablet Commonly known  as: K-PHOS ORIGINAL Take 1 tablet (500 mg total) by mouth daily.   rosuvastatin 20 MG tablet Commonly known as: CRESTOR   tamsulosin 0.4 MG Caps capsule Commonly known as: FLOMAX Take 1 capsule (0.4 mg total) by mouth daily.   Toujeo Max SoloStar 300 UNIT/ML Sopn Generic drug: Insulin Glargine (2 Unit Dial) Inject 20 Units into the skin daily. Titrate up to 60 units/day, per endocrinology instructions.   TURMERIC CURCUMIN PO Take 1 capsule by mouth daily.   vitamin C 500 MG tablet Commonly known as: ASCORBIC ACID Take 1,000 mg by mouth daily.   Vitamin D (Ergocalciferol) 1.25 MG (50000 UT) Caps capsule Commonly known as: DRISDOL   vitamin E 400 UNIT capsule Take 400 Units by mouth daily.       Allergies:  Allergies  Allergen Reactions  .  Other Hives    Blue cheese    Family History: Family History  Problem Relation Age of Onset  . Heart failure Father   . Heart attack Brother   . Kidney cancer Neg Hx   . Bladder Cancer Neg Hx   . Prostate cancer Neg Hx     Social History:  reports that he has never smoked. He has never used smokeless tobacco. He reports current alcohol use. He reports that he does not use drugs.  ROS: UROLOGY Frequent Urination?: No Hard to postpone urination?: No Burning/pain with urination?: No Get up at night to urinate?: No Leakage of urine?: No Urine stream starts and stops?: No Trouble starting stream?: No Do you have to strain to urinate?: No Blood in urine?: No Urinary tract infection?: No Sexually transmitted disease?: No Injury to kidneys or bladder?: No Painful intercourse?: No Weak stream?: No Erection problems?: No Penile pain?: No  Gastrointestinal Nausea?: No Vomiting?: No Indigestion/heartburn?: No Diarrhea?: No Constipation?: No  Constitutional Fever: No Night sweats?: No Weight loss?: No Fatigue?: No  Skin Skin rash/lesions?: No Itching?: No  Eyes Blurred vision?: No Double vision?: No   Ears/Nose/Throat Sore throat?: No Sinus problems?: No  Hematologic/Lymphatic Swollen glands?: No Easy bruising?: No  Cardiovascular Leg swelling?: No Chest pain?: No  Respiratory Cough?: No Shortness of breath?: No  Endocrine Excessive thirst?: No  Musculoskeletal Back pain?: Yes Joint pain?: No  Neurological Headaches?: No Dizziness?: No  Psychologic Depression?: No Anxiety?: No  Physical Exam: BP 107/64   Pulse 77   Ht 6' (1.829 m)   Wt 277 lb (125.6 kg)   BMI 37.57 kg/m   Constitutional:  Alert and oriented, No acute distress. HEENT: [place] AT, moist mucus membranes.  Trachea midline, no masses. Cardiovascular: No clubbing, cyanosis, or edema. Respiratory: Normal respiratory effort, no increased work of breathing. GI: Abdomen is obese Skin: No rashes, bruises or suspicious lesions. Neurologic: Grossly intact, no focal deficits, moving all 4 extremities. Psychiatric: Normal mood and affect.  Laboratory Data: Lab Results  Component Value Date   WBC 12.8 (H) 02/06/2019   HGB 12.0 (L) 02/06/2019   HCT 35.5 (L) 02/06/2019   MCV 94.4 02/06/2019   PLT 323 02/06/2019    Lab Results  Component Value Date   CREATININE 2.39 (H) 02/06/2019   Lab Results  Component Value Date   HGBA1C 11.0 (H) 07/08/2015    Pertinent Imaging: Results for orders placed during the hospital encounter of 03/23/19  DG Abd 1 View   Narrative CLINICAL DATA:  Recent lithotripsy on the left  EXAM: ABDOMEN - 1 VIEW  COMPARISON:  03/05/2019  FINDINGS: Scattered large and small bowel gas is noted. Multiple small stone fragments are noted within the collecting system and renal pelvis on the left consistent with the recent lithotripsy. The largest of these fragments measures approximately 8 mm. No definitive ureteral stones are seen at this time. Prosthetic calcifications are noted.  IMPRESSION: Interval lithotripsy on the left with multiple stone fragments identified within  the left renal collecting system as described.   Electronically Signed   By: Inez Catalina M.D.   On: 03/23/2019 14:37    KUB was personally reviewed today.  This is compared to his previous preoperative CT scan.  Significant interval fragmentation of his left UPJ/proximal ureteral stone.  Agree with radiologic interpretation.  Assessment & Plan:    1. Left ureteral stone Status post extremely successful left ESWL on 03/05/2019 with astounding fragmentation a very large 3  cm stone  KUB today shows residual stone fragments/debris cloud within the left renal pelvis with fragments measuring up to 8 mm  He he continues to be asymptomatic this point in time.  Given that he is did extremely well with this procedure, I recommended that we return in about 2 weeks for a staged ESW procedure to treat his residual stone fragment.  [name] agreeable this plan.  We will plan for the procedure with anesthesia as per previous given his multiple medical comorbidities.  Risk benefits are discussed again in detail.  He will need repeat Covid testing and preoperative urine culture.  [name] agreeable this plan.  2. Left renal atrophy Chronic   Hollice Espy, MD  [facility] [address], [place], [place] [address] [phone]

## 2019-03-23 NOTE — Progress Notes (Signed)
03/23/2019 3:02 PM   [name] 12-15-1950 [id number]  Referring provider: Vidal Schwalbe, MD [address] [place],  [place] [address]  Chief Complaint  Patient presents with  . Nephrolithiasis    post op     HPI: Complex 68 year old male with personal history of nephrolithiasis who returns today following left ESWL on 03/05/2019 for a large 3 cm UPJ/proximal ureteral stone.  Please see previous notes for explanation of decision making.  To the density of the stone which is relatively low, he elected to proceed with left ESWL.  The procedure was uncomplicated.  He reports today the bleeding for a day or 2 after the procedure but this resolved spontaneously.  It was not severe.  He reports that he passed innumerable fragments and only strain his urine for the first few days.  He brings a jar which has a copious amount of fragments consistent with fractionated stone material with him today.  Overall he is feeling well.  He tolerated the procedure well.  BB today as below.  He denies any flank pain.  No urinary symptoms.  No further gross hematuria.   PMH: Past Medical History:  Diagnosis Date  . Cardiomyopathy ([place])   . Chronic combined systolic (congestive) and diastolic (congestive) heart failure (HCC)    a. EF 25% by cath in 2013 b. Echo in 07/2015 showing EF of 15-20%, moderate MR, moderate Pulm HTN, severely dilated IVC  . CKD (chronic kidney disease) stage 3, GFR 30-59 ml/min   . Coronary artery disease   . Diabetes mellitus type 2, uncontrolled ([place])    a. A1c 11.0 in 07/2015.  [name] Hypertension   . Kidney stones    Left  . New onset atrial fibrillation ([place])    a. diagnosed in 07/2015 b. started on Eliquis  . Paroxysmal atrial fibrillation (HCC)   . Pollen allergies 09-21-15   pt called and stated that he woke up and had some drainage-pt states he did not see the color of the drainage-pt denies running a fever and this only happened once-pt instructed to call Dr  Audree Bane office if he starts running a fever or if the color of drainage becomes yellow/green  . Psoriasis   . Pulmonary hypertension ([place])   . Renal disorder    kidney stone  . Sleep apnea     Surgical History: Past Surgical History:  Procedure Laterality Date  . CARDIAC CATHETERIZATION N/A 07/25/2015   Procedure: Right/Left Heart Cath and Coronary Angiography;  Surgeon: Wellington Hampshire, MD;  Location: [facility];  Service: Cardiovascular;  Laterality: N/A;  . CARDIAC CATHETERIZATION     [place]  . CARDIAC CATHETERIZATION     [facility] [place]    . CYSTOSCOPY WITH STENT PLACEMENT Left 09/07/2015   Procedure: CYSTOSCOPY WITH STENT PLACEMENT;  Surgeon: Hollice Espy, MD;  Location: [facility];  Service: Urology;  Laterality: Left;  . CYSTOSCOPY/URETEROSCOPY/HOLMIUM LASER/STENT PLACEMENT Left 10/25/2015   Procedure: CYSTOSCOPY/URETEROSCOPY/HOLMIUM LASER/STENT EXCHANGE;  Surgeon: Hollice Espy, MD;  Location: [facility];  Service: Urology;  Laterality: Left;  . EXTRACORPOREAL SHOCK WAVE LITHOTRIPSY Left 03/05/2019   Procedure: EXTRACORPOREAL SHOCK WAVE LITHOTRIPSY (ESWL);  Surgeon: Abbie Sons, MD;  Location: [facility];  Service: Urology;  Laterality: Left;  . KIDNEY SURGERY    . NEPHROSTOMY TUBE PLACEMENT ([place] HX) Left   . URETEROSCOPY WITH HOLMIUM LASER LITHOTRIPSY Left 09/07/2015   Procedure: URETEROSCOPY WITH HOLMIUM LASER LITHOTRIPSY;  Surgeon:  Hollice Espy, MD;  Location: [facility];  Service: Urology;  Laterality: Left;    Home Medications:  Allergies as of 03/23/2019   No Known Allergies     Medication List       Accurate as of March 23, 2019 11:59 PM. If you have any questions, ask your nurse or doctor.        amiodarone 200 MG tablet Commonly known as: PACERONE Take 1 tablet (200 mg total) by mouth daily. What changed: when to take this   apixaban 5 MG Tabs tablet Commonly known as: Eliquis Take 1  tablet (5 mg total) 2 (two) times daily by mouth.   b complex vitamins tablet Take 1 tablet by mouth daily.   baclofen 10 MG tablet Commonly known as: LIORESAL Take 10 mg by mouth daily.   carvedilol 6.25 MG tablet Commonly known as: COREG TAKE 1 TABLET BY MOUTH TWICE DAILY   clonazePAM 1 MG tablet Commonly known as: KLONOPIN   CO Q 10 PO Take 400 mg by mouth daily.   Cranberry 500 MG Caps Take 1,000 mg by mouth daily.   docusate sodium 100 MG capsule Commonly known as: COLACE Take 1 capsule (100 mg total) by mouth 2 (two) times daily.   furosemide 80 MG tablet Commonly known as: LASIX Take 1 tablet (80 mg total) by mouth daily.   glipiZIDE 10 MG tablet Commonly known as: GLUCOTROL Take 10 mg by mouth 2 (two) times daily.   Hawthorne Berry 550 MG Caps Take 550 mg by mouth daily.   HYDROcodone-acetaminophen 5-325 MG tablet Commonly known as: NORCO/VICODIN Take 1 tablet by mouth every 4 (four) hours as needed for moderate pain.   Jardiance 10 MG Tabs tablet Generic drug: empagliflozin 10 mg daily.   losartan 25 MG tablet Commonly known as: COZAAR Take 25 mg by mouth daily.   montelukast 10 MG tablet Commonly known as: SINGULAIR   Multi-Vitamins Tabs Take by mouth.   multivitamin-lutein Caps capsule Take 1 capsule by mouth 2 (two) times daily.   OVER THE COUNTER MEDICATION Take 3 capsules by mouth 2 (two) times daily. Reported on 12/23/2015   OVER THE COUNTER MEDICATION Take 1 capsule by mouth daily. Pt takes caprylic acid.   oxyCODONE-acetaminophen 5-325 MG tablet Commonly known as: Percocet Take 1-2 tablets by mouth every 4 (four) hours as needed for moderate pain or severe pain.   Ozempic (1 MG/DOSE) 2 MG/1.5ML Sopn Generic drug: Semaglutide (1 MG/DOSE)   polyethylene glycol 17 g packet Commonly known as: MIRALAX / GLYCOLAX Take 17 g by mouth daily as needed for mild constipation.   potassium phosphate (monobasic) 500 MG tablet Commonly known  as: K-PHOS ORIGINAL Take 1 tablet (500 mg total) by mouth daily.   rosuvastatin 20 MG tablet Commonly known as: CRESTOR   tamsulosin 0.4 MG Caps capsule Commonly known as: FLOMAX Take 1 capsule (0.4 mg total) by mouth daily.   Toujeo Max SoloStar 300 UNIT/ML Sopn Generic drug: Insulin Glargine (2 Unit Dial) Inject 20 Units into the skin daily. Titrate up to 60 units/day, per endocrinology instructions.   TURMERIC CURCUMIN PO Take 1 capsule by mouth daily.   vitamin C 500 MG tablet Commonly known as: ASCORBIC ACID Take 1,000 mg by mouth daily.   Vitamin D (Ergocalciferol) 1.25 MG (50000 UT) Caps capsule Commonly known as: DRISDOL   vitamin E 400 UNIT capsule Take 400 Units by mouth daily.       Allergies:  Allergies  Allergen Reactions  .  Other Hives    Blue cheese    Family History: Family History  Problem Relation Age of Onset  . Heart failure Father   . Heart attack Brother   . Kidney cancer Neg Hx   . Bladder Cancer Neg Hx   . Prostate cancer Neg Hx     Social History:  reports that he has never smoked. He has never used smokeless tobacco. He reports current alcohol use. He reports that he does not use drugs.  ROS: UROLOGY Frequent Urination?: No Hard to postpone urination?: No Burning/pain with urination?: No Get up at night to urinate?: No Leakage of urine?: No Urine stream starts and stops?: No Trouble starting stream?: No Do you have to strain to urinate?: No Blood in urine?: No Urinary tract infection?: No Sexually transmitted disease?: No Injury to kidneys or bladder?: No Painful intercourse?: No Weak stream?: No Erection problems?: No Penile pain?: No  Gastrointestinal Nausea?: No Vomiting?: No Indigestion/heartburn?: No Diarrhea?: No Constipation?: No  Constitutional Fever: No Night sweats?: No Weight loss?: No Fatigue?: No  Skin Skin rash/lesions?: No Itching?: No  Eyes Blurred vision?: No Double vision?: No   Ears/Nose/Throat Sore throat?: No Sinus problems?: No  Hematologic/Lymphatic Swollen glands?: No Easy bruising?: No  Cardiovascular Leg swelling?: No Chest pain?: No  Respiratory Cough?: No Shortness of breath?: No  Endocrine Excessive thirst?: No  Musculoskeletal Back pain?: Yes Joint pain?: No  Neurological Headaches?: No Dizziness?: No  Psychologic Depression?: No Anxiety?: No  Physical Exam: BP 107/64   Pulse 77   Ht 6' (1.829 m)   Wt 277 lb (125.6 kg)   BMI 37.57 kg/m   Constitutional:  Alert and oriented, No acute distress. HEENT: [place] AT, moist mucus membranes.  Trachea midline, no masses. Cardiovascular: No clubbing, cyanosis, or edema. Respiratory: Normal respiratory effort, no increased work of breathing. GI: Abdomen is obese Skin: No rashes, bruises or suspicious lesions. Neurologic: Grossly intact, no focal deficits, moving all 4 extremities. Psychiatric: Normal mood and affect.  Laboratory Data: Lab Results  Component Value Date   WBC 12.8 (H) 02/06/2019   HGB 12.0 (L) 02/06/2019   HCT 35.5 (L) 02/06/2019   MCV 94.4 02/06/2019   PLT 323 02/06/2019    Lab Results  Component Value Date   CREATININE 2.39 (H) 02/06/2019   Lab Results  Component Value Date   HGBA1C 11.0 (H) 07/08/2015    Pertinent Imaging: Results for orders placed during the hospital encounter of 03/23/19  DG Abd 1 View   Narrative CLINICAL DATA:  Recent lithotripsy on the left  EXAM: ABDOMEN - 1 VIEW  COMPARISON:  03/05/2019  FINDINGS: Scattered large and small bowel gas is noted. Multiple small stone fragments are noted within the collecting system and renal pelvis on the left consistent with the recent lithotripsy. The largest of these fragments measures approximately 8 mm. No definitive ureteral stones are seen at this time. Prosthetic calcifications are noted.  IMPRESSION: Interval lithotripsy on the left with multiple stone fragments identified within  the left renal collecting system as described.   Electronically Signed   By: Inez Catalina M.D.   On: 03/23/2019 14:37    KUB was personally reviewed today.  This is compared to his previous preoperative CT scan.  Significant interval fragmentation of his left UPJ/proximal ureteral stone.  Agree with radiologic interpretation.  Assessment & Plan:    1. Left ureteral stone Status post extremely successful left ESWL on 03/05/2019 with astounding fragmentation a very large 3  cm stone  KUB today shows residual stone fragments/debris cloud within the left renal pelvis with fragments measuring up to 8 mm  He he continues to be asymptomatic this point in time.  Given that he is did extremely well with this procedure, I recommended that we return in about 2 weeks for a staged ESW procedure to treat his residual stone fragment.  [name] agreeable this plan.  We will plan for the procedure with anesthesia as per previous given his multiple medical comorbidities.  Risk benefits are discussed again in detail.  He will need repeat Covid testing and preoperative urine culture.  [name] agreeable this plan.  2. Left renal atrophy Chronic   Hollice Espy, MD  [facility] [address], [place], [place] [address] [phone]

## 2019-03-25 ENCOUNTER — Other Ambulatory Visit: Payer: Self-pay | Admitting: Radiology

## 2019-03-25 DIAGNOSIS — N200 Calculus of kidney: Secondary | ICD-10-CM

## 2019-03-30 ENCOUNTER — Other Ambulatory Visit: Payer: Self-pay | Admitting: Radiology

## 2019-03-30 ENCOUNTER — Other Ambulatory Visit: Payer: Self-pay

## 2019-03-30 ENCOUNTER — Other Ambulatory Visit
Admission: RE | Admit: 2019-03-30 | Discharge: 2019-03-30 | Disposition: A | Discharge status: Home / Self Care | Payer: Medicare Other | Source: Ambulatory Visit | Attending: Urology | Admitting: Urology

## 2019-03-30 DIAGNOSIS — N200 Calculus of kidney: Secondary | ICD-10-CM

## 2019-03-30 DIAGNOSIS — Z20828 Contact with and (suspected) exposure to other viral communicable diseases: Secondary | ICD-10-CM | POA: Insufficient documentation

## 2019-03-30 DIAGNOSIS — Z01812 Encounter for preprocedural laboratory examination: Secondary | ICD-10-CM | POA: Diagnosis present

## 2019-03-31 LAB — SARS CORONAVIRUS 2 (TAT 6-24 HRS): SARS Coronavirus 2: NEGATIVE

## 2019-04-01 MED ORDER — CIPROFLOXACIN IN D5W 400 MG/200ML IV SOLN
400.0000 mg | INTRAVENOUS | Status: AC
  Administered 2019-04-02: 400 mg via INTRAVENOUS

## 2019-04-02 ENCOUNTER — Ambulatory Visit: Payer: Medicare Other

## 2019-04-02 ENCOUNTER — Encounter
Admission: RE | Disposition: A | Discharge status: Home / Self Care | Payer: Self-pay | Source: Home / Self Care | Attending: Urology

## 2019-04-02 ENCOUNTER — Ambulatory Visit
Admission: RE | Admit: 2019-04-02 | Discharge: 2019-04-02 | Disposition: A | Discharge status: Home / Self Care | Payer: Medicare Other | Attending: Urology | Admitting: Urology

## 2019-04-02 ENCOUNTER — Ambulatory Visit: Payer: Medicare Other | Admitting: Anesthesiology

## 2019-04-02 ENCOUNTER — Other Ambulatory Visit: Payer: Self-pay

## 2019-04-02 DIAGNOSIS — I130 Hypertensive heart and chronic kidney disease with heart failure and stage 1 through stage 4 chronic kidney disease, or unspecified chronic kidney disease: Secondary | ICD-10-CM | POA: Diagnosis not present

## 2019-04-02 DIAGNOSIS — I2510 Atherosclerotic heart disease of native coronary artery without angina pectoris: Secondary | ICD-10-CM | POA: Insufficient documentation

## 2019-04-02 DIAGNOSIS — E1122 Type 2 diabetes mellitus with diabetic chronic kidney disease: Secondary | ICD-10-CM | POA: Insufficient documentation

## 2019-04-02 DIAGNOSIS — I5042 Chronic combined systolic (congestive) and diastolic (congestive) heart failure: Secondary | ICD-10-CM | POA: Diagnosis not present

## 2019-04-02 DIAGNOSIS — N183 Chronic kidney disease, stage 3 unspecified: Secondary | ICD-10-CM | POA: Insufficient documentation

## 2019-04-02 DIAGNOSIS — N200 Calculus of kidney: Secondary | ICD-10-CM | POA: Insufficient documentation

## 2019-04-02 DIAGNOSIS — Z955 Presence of coronary angioplasty implant and graft: Secondary | ICD-10-CM | POA: Insufficient documentation

## 2019-04-02 DIAGNOSIS — I429 Cardiomyopathy, unspecified: Secondary | ICD-10-CM | POA: Insufficient documentation

## 2019-04-02 DIAGNOSIS — Z794 Long term (current) use of insulin: Secondary | ICD-10-CM | POA: Diagnosis not present

## 2019-04-02 DIAGNOSIS — Z79899 Other long term (current) drug therapy: Secondary | ICD-10-CM | POA: Insufficient documentation

## 2019-04-02 DIAGNOSIS — I480 Paroxysmal atrial fibrillation: Secondary | ICD-10-CM | POA: Diagnosis not present

## 2019-04-02 DIAGNOSIS — G4730 Sleep apnea, unspecified: Secondary | ICD-10-CM | POA: Diagnosis not present

## 2019-04-02 DIAGNOSIS — Z7901 Long term (current) use of anticoagulants: Secondary | ICD-10-CM | POA: Insufficient documentation

## 2019-04-02 DIAGNOSIS — Z6837 Body mass index (BMI) 37.0-37.9, adult: Secondary | ICD-10-CM | POA: Insufficient documentation

## 2019-04-02 DIAGNOSIS — I252 Old myocardial infarction: Secondary | ICD-10-CM | POA: Diagnosis not present

## 2019-04-02 DIAGNOSIS — I272 Pulmonary hypertension, unspecified: Secondary | ICD-10-CM | POA: Diagnosis not present

## 2019-04-02 HISTORY — PX: EXTRACORPOREAL SHOCK WAVE LITHOTRIPSY: SHX1557

## 2019-04-02 LAB — GLUCOSE, CAPILLARY
Glucose-Capillary: 119 mg/dL — ABNORMAL HIGH (ref 70–99)
Glucose-Capillary: 143 mg/dL — ABNORMAL HIGH (ref 70–99)

## 2019-04-02 LAB — URINE DRUG SCREEN, QUALITATIVE (ARMC ONLY)
Amphetamines, Ur Screen: NOT DETECTED
Barbiturates, Ur Screen: NOT DETECTED
Benzodiazepine, Ur Scrn: POSITIVE — AB
Cannabinoid 50 Ng, Ur ~~LOC~~: NOT DETECTED
Cocaine Metabolite,Ur ~~LOC~~: NOT DETECTED
MDMA (Ecstasy)Ur Screen: NOT DETECTED
Methadone Scn, Ur: NOT DETECTED
Opiate, Ur Screen: NOT DETECTED
Phencyclidine (PCP) Ur S: NOT DETECTED
Tricyclic, Ur Screen: NOT DETECTED

## 2019-04-02 IMAGING — CR DG ABDOMEN 1V
1 series · 2 of 2 positions shown · non-contrast
Comparison: Ten days ago

CLINICAL DATA: Pre lithotripsy for left kidney stones

EXAM:
ABDOMEN - 1 VIEW

[Series 1: dg abd 1 view · 0.14mm/px · 2 of 2 slices shown]
[im 1/2]
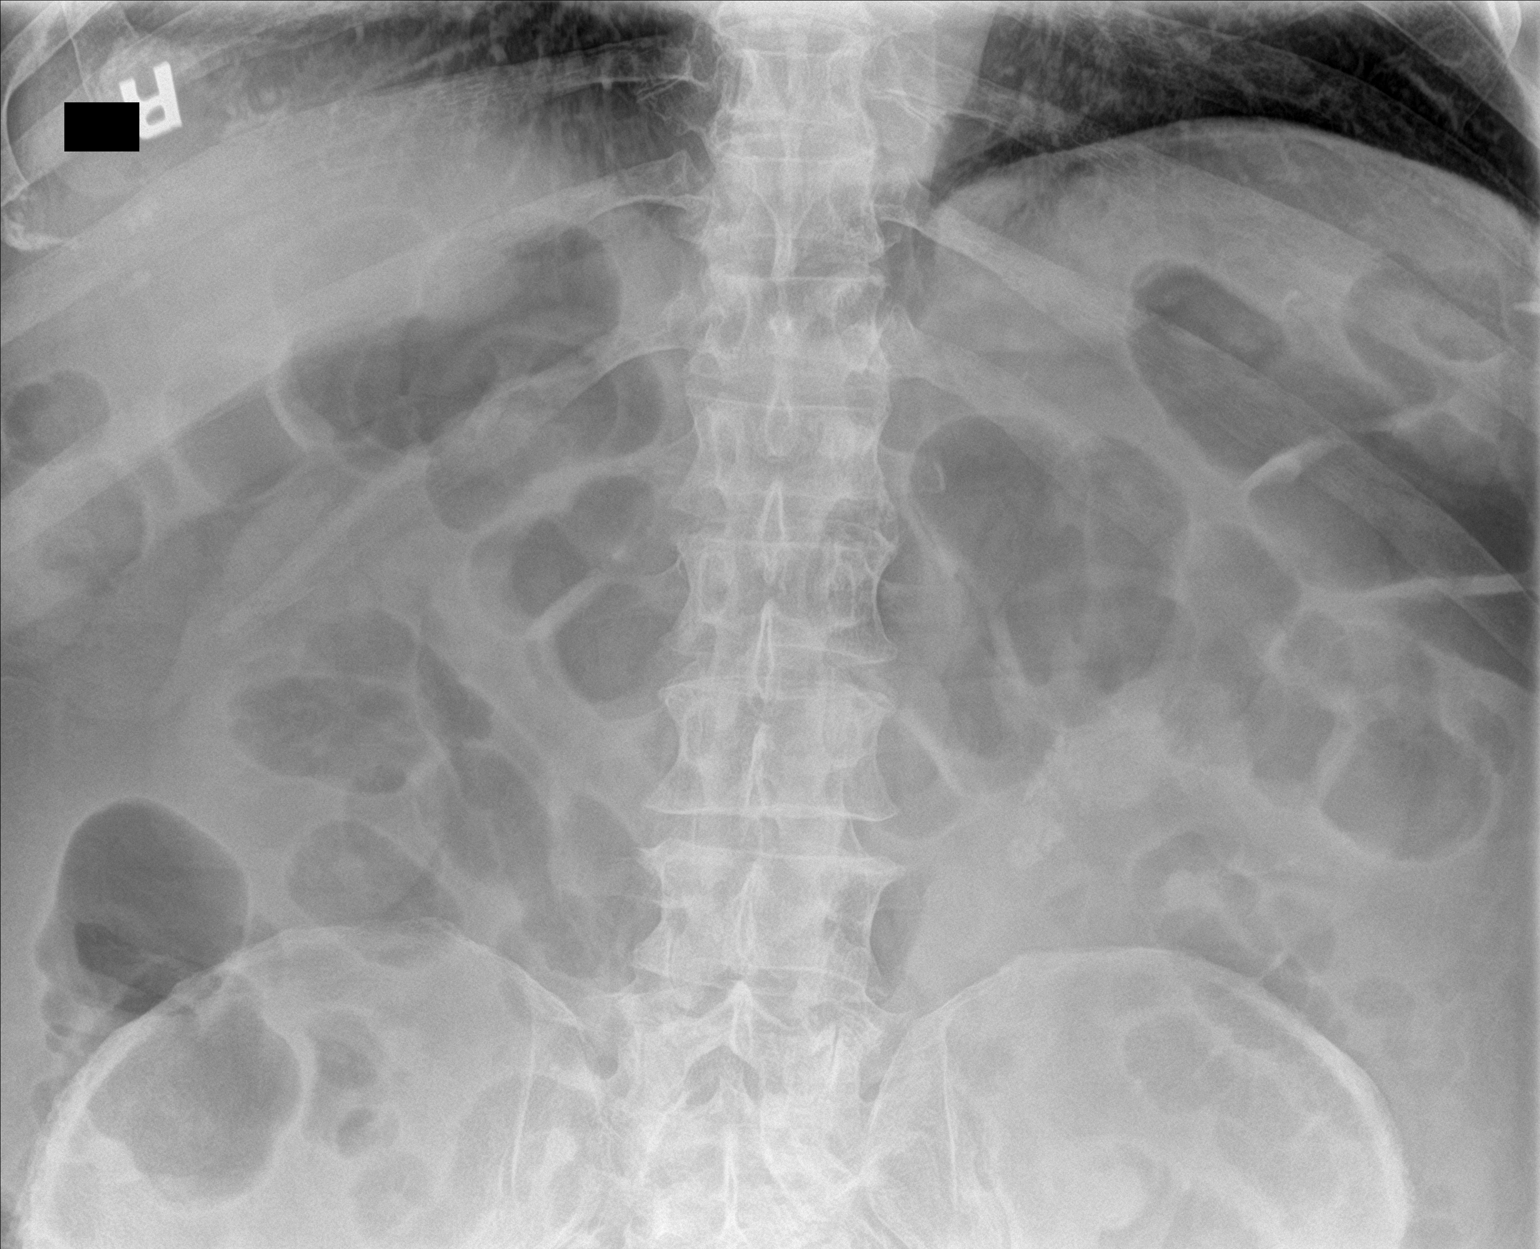
[im 2/2]
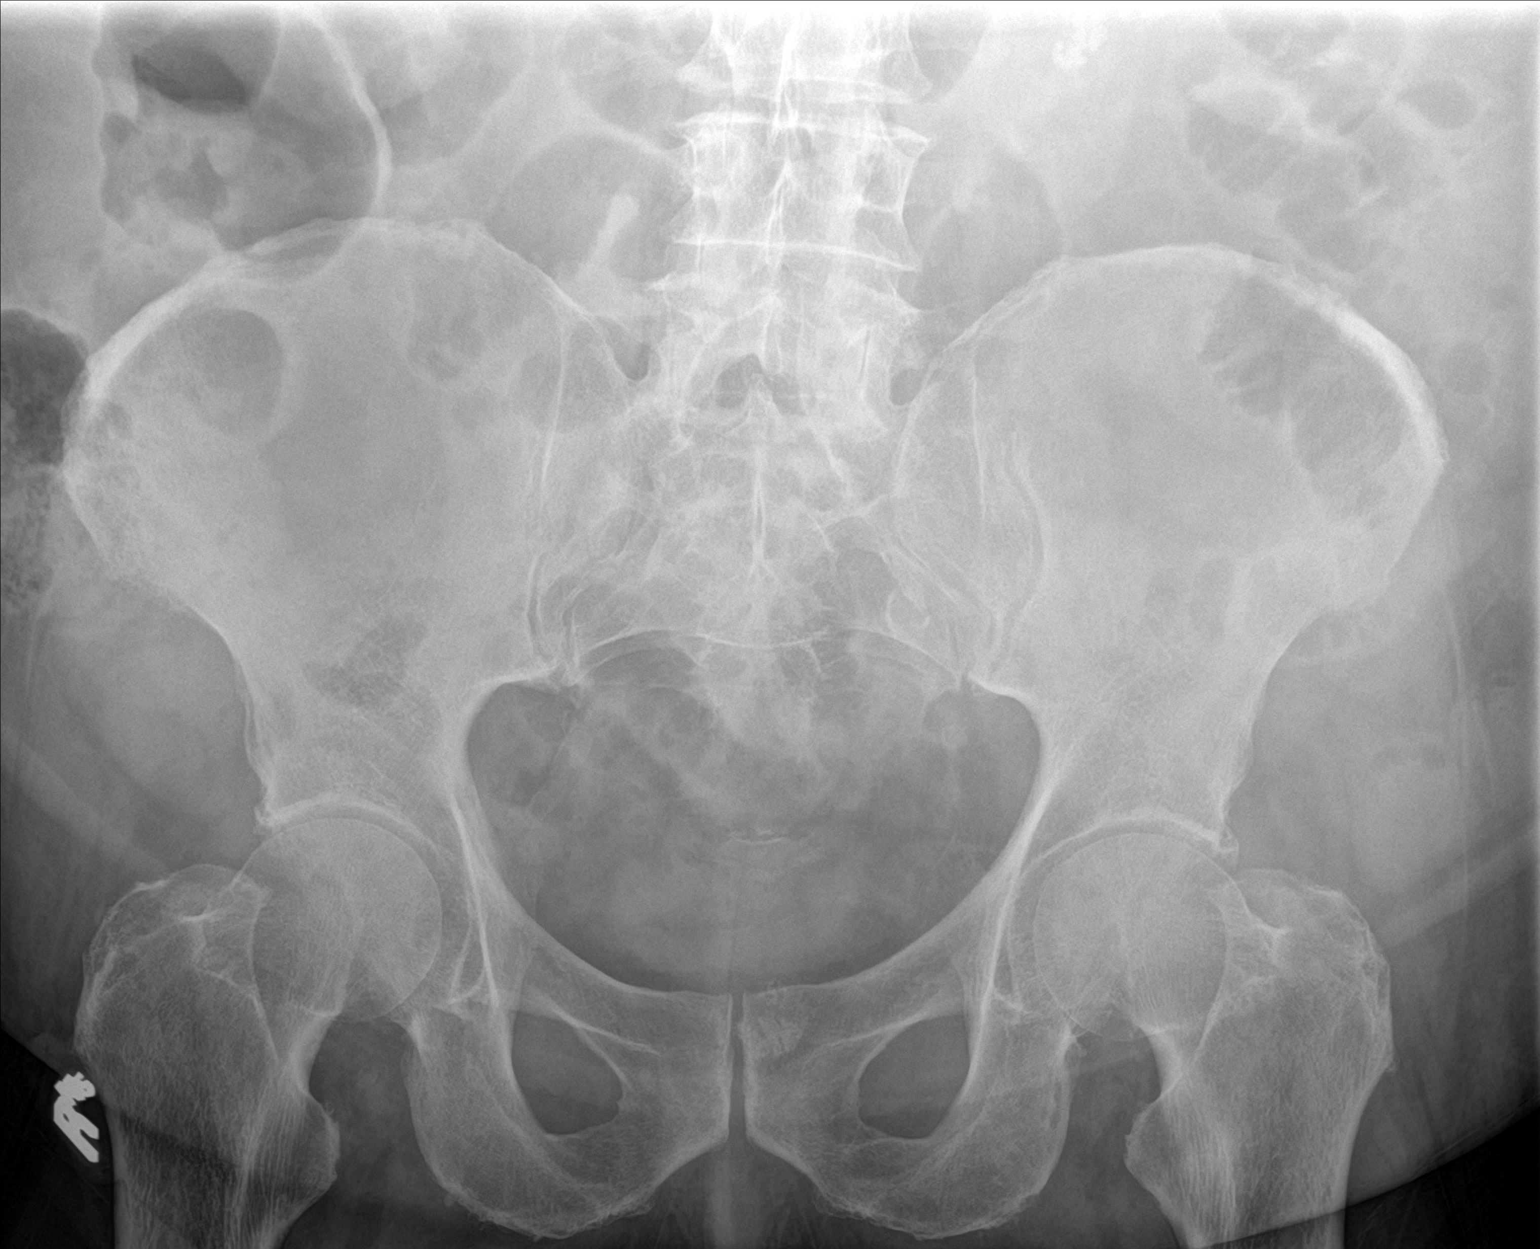

[2 of 2 positions shown; findings below may reference images not displayed]

FINDINGS: Cluster of small calculi over the left kidney encompassing a 3.6 cm
area. This was a single branching calculus on [DATE] CT. No
visible ureteral stone. Prostate calcification.
IMPRESSION: Numerous fragmented calculi at the level of the left renal pelvis,
stable from prior. No visible ureteral stone.

## 2019-04-02 SURGERY — LITHOTRIPSY, ESWL
Anesthesia: General | Laterality: Left

## 2019-04-02 MED ORDER — MIDAZOLAM HCL 2 MG/2ML IJ SOLN
INTRAMUSCULAR | Status: DC | PRN
  Administered 2019-04-02: .5 mg via INTRAVENOUS
  Administered 2019-04-02: 1 mg via INTRAVENOUS
  Administered 2019-04-02: 0.5 mg via INTRAVENOUS

## 2019-04-02 MED ORDER — FENTANYL CITRATE (PF) 100 MCG/2ML IJ SOLN
INTRAMUSCULAR | Status: DC | PRN
  Administered 2019-04-02: 50 ug via INTRAVENOUS
  Administered 2019-04-02 (×2): 25 ug via INTRAVENOUS

## 2019-04-02 MED ORDER — PROPOFOL 500 MG/50ML IV EMUL
INTRAVENOUS | Status: AC
  Filled 2019-04-02: qty 50

## 2019-04-02 MED ORDER — DEXTROSE 5 % IV SOLN
INTRAVENOUS | Status: DC | PRN
  Administered 2019-04-02: 08:00:00 via INTRAVENOUS

## 2019-04-02 MED ORDER — FENTANYL CITRATE (PF) 100 MCG/2ML IJ SOLN
INTRAMUSCULAR | Status: AC
  Filled 2019-04-02: qty 2

## 2019-04-02 MED ORDER — MIDAZOLAM HCL 2 MG/2ML IJ SOLN
INTRAMUSCULAR | Status: AC
  Filled 2019-04-02: qty 2

## 2019-04-02 MED ORDER — PROPOFOL 10 MG/ML IV BOLUS
INTRAVENOUS | Status: AC
  Filled 2019-04-02: qty 20

## 2019-04-02 MED ORDER — PROPOFOL 10 MG/ML IV BOLUS
INTRAVENOUS | Status: DC | PRN
  Administered 2019-04-02 (×3): 10 mg via INTRAVENOUS
  Administered 2019-04-02: 20 mg via INTRAVENOUS
  Administered 2019-04-02: 10 mg via INTRAVENOUS
  Administered 2019-04-02: 20 mg via INTRAVENOUS

## 2019-04-02 MED ORDER — SODIUM CHLORIDE 0.9 % IV SOLN
INTRAVENOUS | Status: DC
  Administered 2019-04-02: 07:00:00 via INTRAVENOUS

## 2019-04-02 MED ORDER — FENTANYL CITRATE (PF) 100 MCG/2ML IJ SOLN: 25.0000 ug | INTRAMUSCULAR | Status: DC | PRN

## 2019-04-02 MED ORDER — ONDANSETRON HCL 4 MG/2ML IJ SOLN
INTRAMUSCULAR | Status: AC
  Administered 2019-04-02: 4 mg via INTRAVENOUS
  Filled 2019-04-02: qty 2

## 2019-04-02 MED ORDER — CIPROFLOXACIN IN D5W 400 MG/200ML IV SOLN
INTRAVENOUS | Status: AC
  Filled 2019-04-02: qty 200

## 2019-04-02 MED ORDER — ONDANSETRON HCL 4 MG/2ML IJ SOLN
4.0000 mg | Freq: Once | INTRAMUSCULAR | Status: AC | PRN
  Administered 2019-04-02: 07:00:00 4 mg via INTRAVENOUS

## 2019-04-02 NOTE — Transfer of Care (Signed)
Immediate Anesthesia Transfer of Care Note  Patient: [name]  Procedure(s) Performed: EXTRACORPOREAL SHOCK WAVE LITHOTRIPSY (ESWL) (Left )  Patient Location: PACU  Anesthesia Type:MAC  Level of Consciousness: awake, alert  and oriented  Airway & Oxygen Therapy: Patient Spontanous Breathing  Post-op Assessment: Report given to RN and Post -op Vital signs reviewed and stable  Post vital signs: Reviewed and stable  Last Vitals:  Vitals Value Taken Time  BP 122/62 04/02/19 0919  Temp 36.2 C 04/02/19 0919  Pulse 59 04/02/19 0921  Resp 17 04/02/19 0921  SpO2 99 % 04/02/19 0921  Vitals shown include unvalidated device data.  Last Pain:  Vitals:   04/02/19 0919  TempSrc:   PainSc: 0-No pain         Complications: No apparent anesthesia complications

## 2019-04-02 NOTE — Anesthesia Procedure Notes (Signed)
Date/Time: 04/02/2019 8:16 AM Performed by: Nelda Marseille, CRNA Pre-anesthesia Checklist: Patient identified, Emergency Drugs available, Suction available, Patient being monitored and Timeout performed Oxygen Delivery Method: Nasal cannula

## 2019-04-02 NOTE — Anesthesia Preprocedure Evaluation (Addendum)
Anesthesia Evaluation  Patient identified by MRN, date of birth, ID band Patient awake    Reviewed: Allergy & Precautions, H&P , NPO status , Patient's Chart, lab work & pertinent test results  History of Anesthesia Complications Negative for: history of anesthetic complications  Airway Mallampati: III  TM Distance: <3 FB Neck ROM: limited    Dental  (+) Chipped   Pulmonary shortness of breath and with exertion, sleep apnea and Continuous Positive Airway Pressure Ventilation , neg COPD,           Cardiovascular hypertension, (-) angina+ CAD, + Past MI, + Cardiac Stents and +CHF  + dysrhythmias Atrial Fibrillation   Previous echos showed EF 20%.  Most recent echo showed normal systolic function   4/65/03: - Left ventricle: The cavity size was normal. Systolic function was   normal. The estimated ejection fraction was in the range of 55%   to 60%. Wall motion was normal; there were no regional wall   motion abnormalities. Doppler parameters are consistent with   abnormal left ventricular relaxation (grade 1 diastolic   dysfunction). - Left atrium: The atrium was mildly dilated. - Right ventricle: Systolic function was normal. - Pulmonary arteries: Systolic pressure was within the normal   range.   Neuro/Psych negative neurological ROS  negative psych ROS   GI/Hepatic negative GI ROS, Neg liver ROS,   Endo/Other  diabetes, Type 2Morbid obesity  Renal/GU Renal disease  negative genitourinary   Musculoskeletal   Abdominal   Peds  Hematology negative hematology ROS (+)   Anesthesia Other Findings Past Medical History: No date: Cardiomyopathy ([place]) No date: Chronic combined systolic (congestive) and diastolic  (congestive) heart failure (HCC)     Comment:  a. EF 25% by cath in 2013 b. Echo in 07/2015 showing EF               of 15-20%, moderate MR, moderate Pulm HTN, severely               dilated IVC No date: CKD  (chronic kidney disease) stage 3, GFR 30-59 ml/min No date: Coronary artery disease No date: Diabetes mellitus type 2, uncontrolled (HCC)     Comment:  a. A1c 11.0 in 07/2015. No date: Hypertension No date: Kidney stones     Comment:  Left No date: New onset atrial fibrillation (HCC)     Comment:  a. diagnosed in 07/2015 b. started on Eliquis No date: Paroxysmal atrial fibrillation ([place]) 09-21-15: Pollen allergies     Comment:  pt called and stated that he woke up and had some               drainage-pt states he did not see the color of the               drainage-pt denies running a fever and this only happened              once-pt instructed to call Dr Audree Bane office if he               starts running a fever or if the color of drainage               becomes yellow/green No date: Psoriasis No date: Pulmonary hypertension ([place]) No date: Renal disorder     Comment:  kidney stone No date: Sleep apnea  Past Surgical History: 07/25/2015: CARDIAC CATHETERIZATION; N/A     Comment:  Procedure: Right/Left Heart Cath and Coronary  Angiography;  Surgeon: Wellington Hampshire, MD;  Location:               [facility];  Service: Cardiovascular;                Laterality: N/A; No date: CARDIAC CATHETERIZATION     Comment:  [place] No date: CARDIAC CATHETERIZATION     Comment:  [place], [place] No date: CORONARY ANGIOPLASTY WITH STENT PLACEMENT 09/07/2015: [place]; Left     Comment:  Procedure: CYSTOSCOPY WITH STENT PLACEMENT;  Surgeon:               Hollice Espy, MD;  Location: [facility];  Service:               Urology;  Laterality: Left; 10/25/2015: CYSTOSCOPY/URETEROSCOPY/HOLMIUM LASER/STENT PLACEMENT; Left     Comment:  Procedure: CYSTOSCOPY/URETEROSCOPY/HOLMIUM LASER/STENT               EXCHANGE;  Surgeon: Hollice Espy, MD;  Location: [facility]               ORS;  Service: Urology;  Laterality: Left; No date: KIDNEY SURGERY No date:  NEPHROSTOMY TUBE PLACEMENT ([place] HX); Left 09/07/2015: URETEROSCOPY WITH HOLMIUM LASER LITHOTRIPSY; Left     Comment:  Procedure: URETEROSCOPY WITH HOLMIUM LASER LITHOTRIPSY;               Surgeon: Hollice Espy, MD;  Location: [facility];                Service: Urology;  Laterality: Left;     Reproductive/Obstetrics negative OB ROS                            Anesthesia Physical  Anesthesia Plan  ASA: III  Anesthesia Plan: General   Post-op Pain Management:    Induction: Intravenous  PONV Risk Score and Plan:   Airway Management Planned: Natural Airway and Nasal Cannula  Additional Equipment:   Intra-op Plan:   Post-operative Plan:   Informed Consent: I have reviewed the patients History and Physical, chart, labs and discussed the procedure including the risks, benefits and alternatives for the proposed anesthesia with the patient or authorized representative who has indicated his/her understanding and acceptance.     Dental Advisory Given  Plan Discussed with: Anesthesiologist, CRNA and Surgeon  Anesthesia Plan Comments:        Anesthesia Quick Evaluation

## 2019-04-02 NOTE — Discharge Instructions (Signed)
See [facility] discharge instructions in chart.  AMBULATORY SURGERY  DISCHARGE INSTRUCTIONS   1) The drugs that you were given will stay in your system until tomorrow so for the next 24 hours you should not:  A) Drive an automobile B) Make any legal decisions C) Drink any alcoholic beverage   2) You may resume regular meals tomorrow.  Today it is better to start with liquids and gradually work up to solid foods.  You may eat anything you prefer, but it is better to start with liquids, then soup and crackers, and gradually work up to solid foods.   3) Please notify your doctor immediately if you have any unusual bleeding, trouble breathing, redness and pain at the surgery site, drainage, fever, or pain not relieved by medication.    4) Additional Instructions:        Please contact your physician with any problems or Same Day Surgery at [phone], Monday through Friday 6 am to 4 pm, or [facility] Main number at [phone].  

## 2019-04-02 NOTE — Anesthesia Postprocedure Evaluation (Signed)
Anesthesia Post Note  Patient: [name]  Procedure(s) Performed: EXTRACORPOREAL SHOCK WAVE LITHOTRIPSY (ESWL) (Left )  Patient location during evaluation: PACU Anesthesia Type: General Level of consciousness: awake and alert Pain management: pain level controlled Vital Signs Assessment: post-procedure vital signs reviewed and stable Respiratory status: spontaneous breathing, nonlabored ventilation and respiratory function stable Cardiovascular status: blood pressure returned to baseline and stable Postop Assessment: no apparent nausea or vomiting Anesthetic complications: no     Last Vitals:  Vitals:   04/02/19 0949 04/02/19 0957  BP: 122/67 129/85  Pulse: (!) 58 60  Resp: 12 16  Temp:  (!) 36.2 C  SpO2: 98% 97%    Last Pain:  Vitals:   04/02/19 0957  TempSrc: Temporal  PainSc: 0-No pain                 Durenda Hurt

## 2019-04-02 NOTE — Interval H&P Note (Signed)
History and Physical Interval Note:  04/02/2019 3:10 PM  [name]  has presented today for surgery, with the diagnosis of Kidney stone.  The various methods of treatment have been discussed with the patient and family. After consideration of risks, benefits and other options for treatment, the patient has consented to  Procedure(s): EXTRACORPOREAL SHOCK WAVE LITHOTRIPSY (ESWL) (Left) as a surgical intervention.  The patient's history has been reviewed, patient examined, no change in status, stable for surgery.  I have reviewed the patient's chart and labs.  Questions were answered to the patient's satisfaction.     Hollice Espy

## 2019-04-02 NOTE — Progress Notes (Signed)
Patient took insulin Toujeo 40 units and glipizide 10mg  this morning.  FSBS 119 at [phone]. Mark CRNA and Dr. Ola Spurr aware.

## 2019-04-02 NOTE — Interval H&P Note (Signed)
History and Physical Interval Note:  04/02/2019 3:09 PM  [name] [facility]  has presented today for surgery, with the diagnosis of Kidney stone.  The various methods of treatment have been discussed with the patient and family. After consideration of risks, benefits and other options for treatment, the patient has consented to  Procedure(s): EXTRACORPOREAL SHOCK WAVE LITHOTRIPSY (ESWL) (Left) as a surgical intervention.  The patient's history has been reviewed, patient examined, no change in status, stable for surgery.  I have reviewed the patient's chart and labs.  Questions were answered to the patient's satisfaction.     Hollice Espy

## 2019-04-02 NOTE — Anesthesia Post-op Follow-up Note (Signed)
Anesthesia QCDR form completed.        

## 2019-04-03 ENCOUNTER — Encounter: Payer: Self-pay | Admitting: Urology

## 2019-04-10 ENCOUNTER — Other Ambulatory Visit: Payer: Self-pay

## 2019-04-10 ENCOUNTER — Emergency Department: Payer: Medicare Other

## 2019-04-10 ENCOUNTER — Emergency Department
Admission: EM | Admit: 2019-04-10 | Discharge: 2019-04-10 | Disposition: A | Discharge status: Home / Self Care | Payer: Medicare Other | Attending: Emergency Medicine | Admitting: Emergency Medicine

## 2019-04-10 DIAGNOSIS — Z79899 Other long term (current) drug therapy: Secondary | ICD-10-CM | POA: Insufficient documentation

## 2019-04-10 DIAGNOSIS — E119 Type 2 diabetes mellitus without complications: Secondary | ICD-10-CM | POA: Insufficient documentation

## 2019-04-10 DIAGNOSIS — R29810 Facial weakness: Secondary | ICD-10-CM

## 2019-04-10 DIAGNOSIS — I130 Hypertensive heart and chronic kidney disease with heart failure and stage 1 through stage 4 chronic kidney disease, or unspecified chronic kidney disease: Secondary | ICD-10-CM | POA: Insufficient documentation

## 2019-04-10 DIAGNOSIS — I2510 Atherosclerotic heart disease of native coronary artery without angina pectoris: Secondary | ICD-10-CM | POA: Insufficient documentation

## 2019-04-10 DIAGNOSIS — Z7901 Long term (current) use of anticoagulants: Secondary | ICD-10-CM | POA: Insufficient documentation

## 2019-04-10 DIAGNOSIS — I5042 Chronic combined systolic (congestive) and diastolic (congestive) heart failure: Secondary | ICD-10-CM | POA: Diagnosis not present

## 2019-04-10 DIAGNOSIS — N183 Chronic kidney disease, stage 3 unspecified: Secondary | ICD-10-CM | POA: Diagnosis not present

## 2019-04-10 DIAGNOSIS — G510 Bell's palsy: Secondary | ICD-10-CM | POA: Insufficient documentation

## 2019-04-10 DIAGNOSIS — I110 Hypertensive heart disease with heart failure: Secondary | ICD-10-CM | POA: Insufficient documentation

## 2019-04-10 DIAGNOSIS — R4701 Aphasia: Secondary | ICD-10-CM | POA: Diagnosis present

## 2019-04-10 LAB — APTT: aPTT: 36 seconds (ref 24–36)

## 2019-04-10 LAB — COMPREHENSIVE METABOLIC PANEL
ALT: 26 U/L (ref 0–44)
AST: 21 U/L (ref 15–41)
Albumin: 4.1 g/dL (ref 3.5–5.0)
Alkaline Phosphatase: 59 U/L (ref 38–126)
Anion gap: 12 (ref 5–15)
BUN: 28 mg/dL — ABNORMAL HIGH (ref 8–23)
CO2: 27 mmol/L (ref 22–32)
Calcium: 9.3 mg/dL (ref 8.9–10.3)
Chloride: 98 mmol/L (ref 98–111)
Creatinine, Ser: 1.58 mg/dL — ABNORMAL HIGH (ref 0.61–1.24)
GFR calc Af Amer: 52 mL/min — ABNORMAL LOW (ref >60)
GFR calc non Af Amer: 45 mL/min — ABNORMAL LOW (ref >60)
Glucose, Bld: 204 mg/dL — ABNORMAL HIGH (ref 70–99)
Potassium: 4 mmol/L (ref 3.5–5.1)
Sodium: 137 mmol/L (ref 135–145)
Total Bilirubin: 1 mg/dL (ref 0.3–1.2)
Total Protein: 7.7 g/dL (ref 6.5–8.1)

## 2019-04-10 LAB — PROTIME-INR
INR: 1.5 — ABNORMAL HIGH (ref 0.8–1.2)
Prothrombin Time: 17.5 seconds — ABNORMAL HIGH (ref 11.4–15.2)

## 2019-04-10 LAB — CBC
HCT: 42.2 % (ref 39.0–52.0)
Hemoglobin: 14.1 g/dL (ref 13.0–17.0)
MCH: 32.5 pg (ref 26.0–34.0)
MCHC: 33.4 g/dL (ref 30.0–36.0)
MCV: 97.2 fL (ref 80.0–100.0)
Platelets: 202 10*3/uL (ref 150–400)
RBC: 4.34 MIL/uL (ref 4.22–5.81)
RDW: 14.2 % (ref 11.5–15.5)
WBC: 8.3 10*3/uL (ref 4.0–10.5)
nRBC: 0 % (ref 0.0–0.2)

## 2019-04-10 LAB — DIFFERENTIAL
Abs Immature Granulocytes: 0.02 10*3/uL (ref 0.00–0.07)
Basophils Absolute: 0 10*3/uL (ref 0.0–0.1)
Basophils Relative: 1 %
Eosinophils Absolute: 0.1 10*3/uL (ref 0.0–0.5)
Eosinophils Relative: 1 %
Immature Granulocytes: 0 %
Lymphocytes Relative: 17 %
Lymphs Abs: 1.4 10*3/uL (ref 0.7–4.0)
Monocytes Absolute: 0.8 10*3/uL (ref 0.1–1.0)
Monocytes Relative: 9 %
Neutro Abs: 6 10*3/uL (ref 1.7–7.7)
Neutrophils Relative %: 72 %

## 2019-04-10 IMAGING — MR MR HEAD W/O CM
11 series · 41 of 48 positions shown · non-contrast
Comparison: Head CT earlier today.

CLINICAL DATA: 68-year-old male with trouble speaking and
swallowing. Lip tingling.

EXAM:
MRI HEAD WITHOUT CONTRAST
TECHNIQUE: Multiplanar, multiecho pulse sequences of the brain and surrounding
structures were obtained without intravenous contrast.

[Series 5: ax dwi_tracew · axial · 3.0mm · 0.60mm/px · z∈[-66,+88]mm · 4 of 48 slices shown]
[im 1/48]
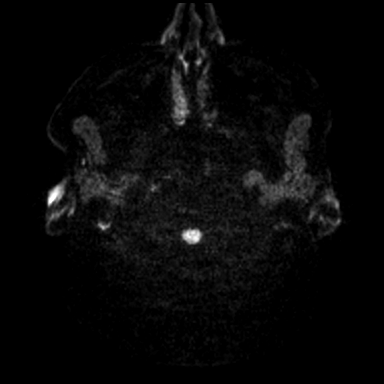
[im 16/48]
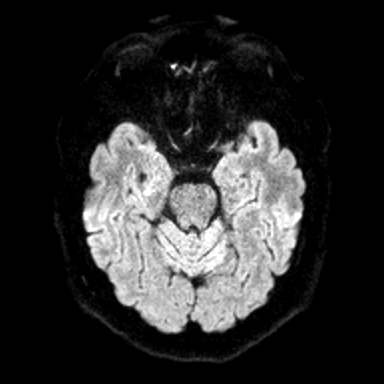
[im 32/48]
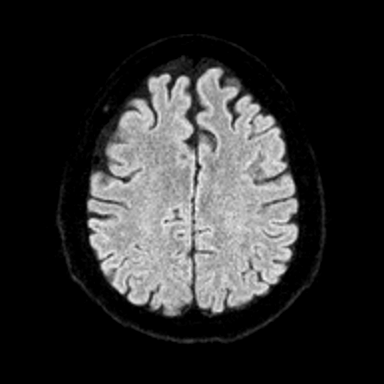
[im 48/48]
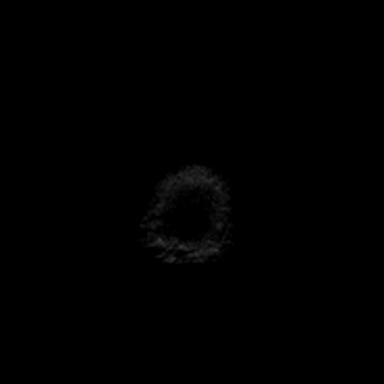

[Series 6: ax dwi_adc · axial · 3.0mm · 0.60mm/px · z∈[-66,+88]mm · 4 of 48 slices shown]
[im 1/48]
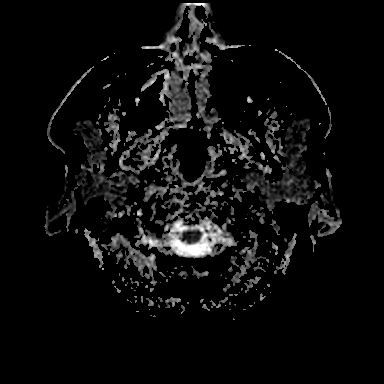
[im 16/48]
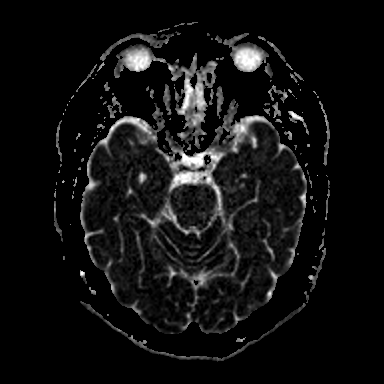
[im 32/48]
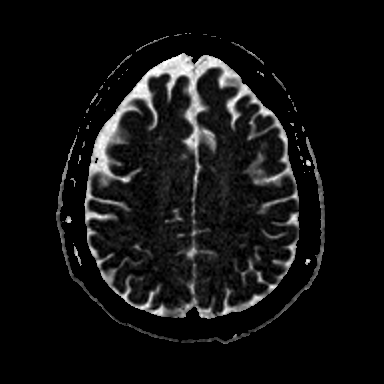
[im 48/48]
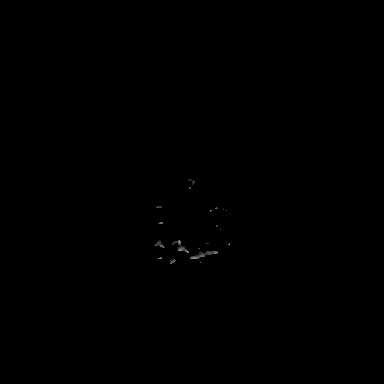

[Series 7: cor dwi_tracew · coronal · 5.0mm · 0.60mm/px · 3 of 38 slices shown]
[im 1/38]
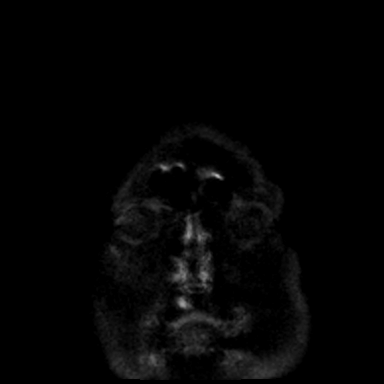
[im 19/38]
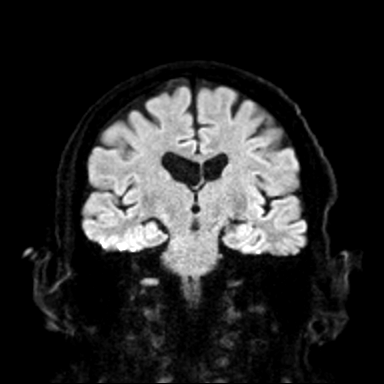
[im 38/38]
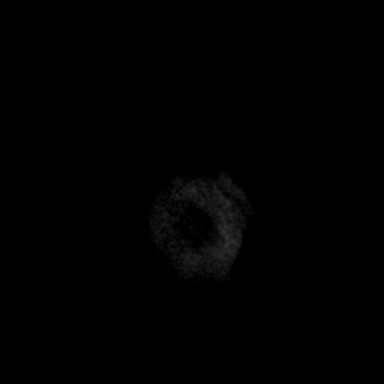

[Series 8: cor dwi_adc · coronal · 5.0mm · 0.60mm/px · 3 of 38 slices shown]
[im 1/38]
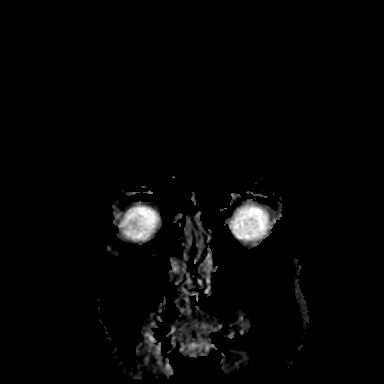
[im 19/38]
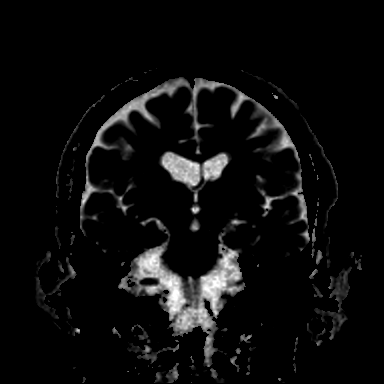
[im 38/38]
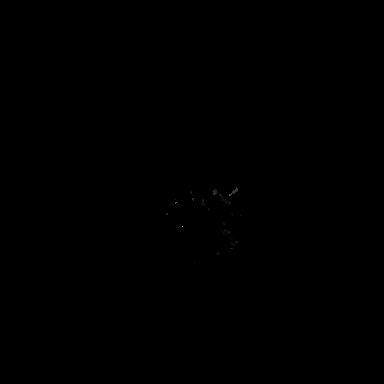

[Series 9: T1 · sagittal · 5.0mm · 0.62mm/px · 2 of 23 slices shown (1 of 2)]
[im 1/23]
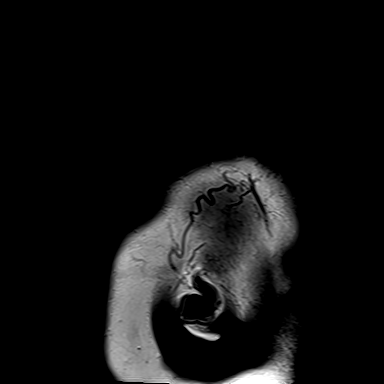
[im 23/23]
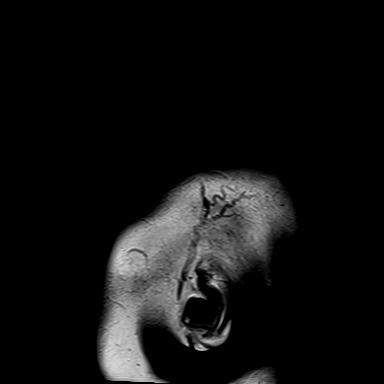

[Series 10: T2 · axial · 5.0mm · 0.53mm/px · z∈[-67,+88]mm · 2 of 27 slices shown (1 of 2)]
[im 1/27]
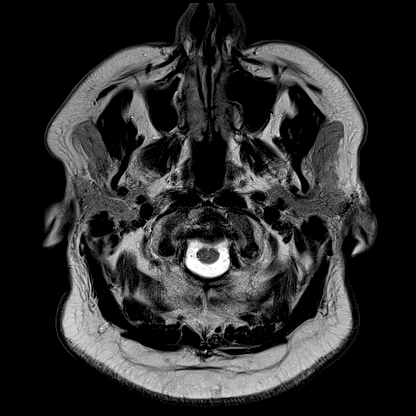
[im 27/27]
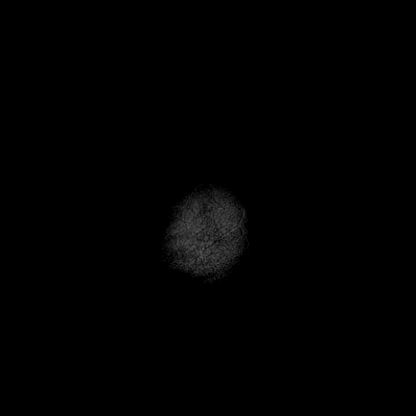

[Series 12: pha_images · axial · 3.0mm · 0.90mm/px · z∈[-76,+100]mm · 5 of 60 slices shown]
[im 1/60]
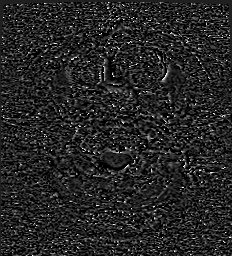
[im 15/60]
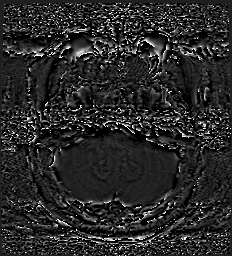
[im 30/60]
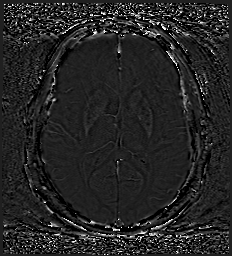
[im 45/60]
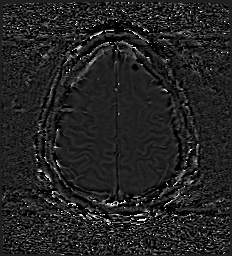
[im 60/60]
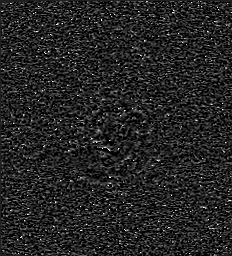

[Series 13: swi_images · axial · 3.0mm · 0.90mm/px · z∈[-76,+10]mm · 3 of 60 slices shown]
[im 1/60]
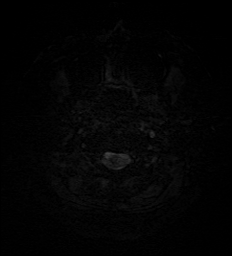
[im 15/60]
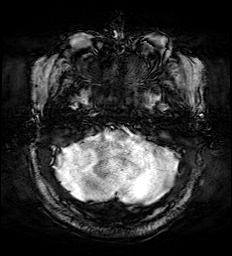
[im 30/60]
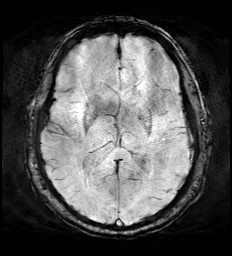

[Series 15: FLAIR · axial · 3.0mm · 0.53mm/px · z∈[-70,+91]mm · 5 of 55 slices shown]
[im 1/55]
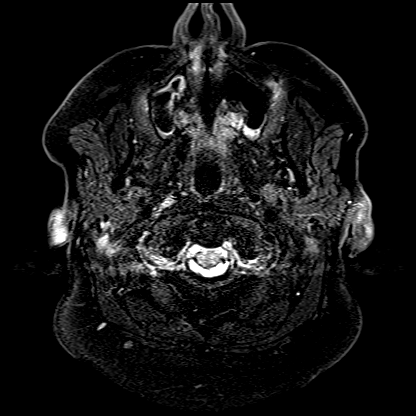
[im 14/55]
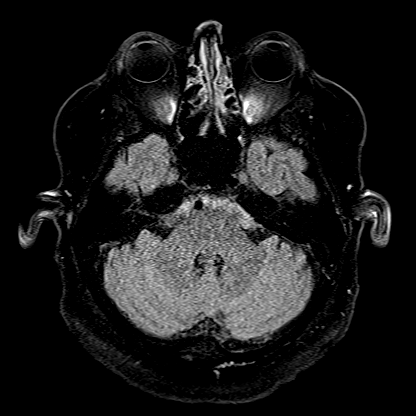
[im 28/55]
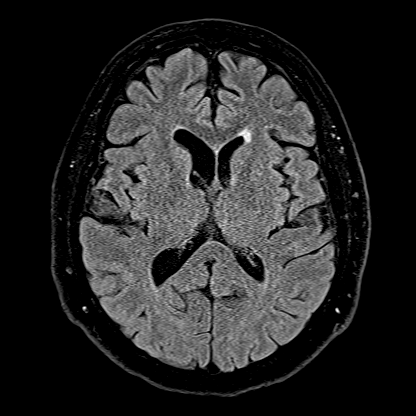
[im 41/55]
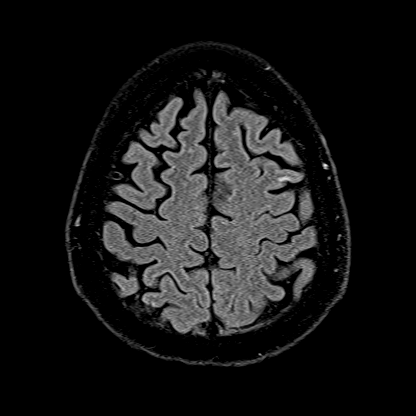
[im 55/55]
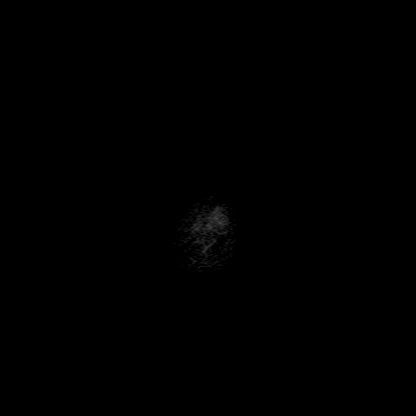

[Series 16: T1 · axial · 1.0mm · 0.98mm/px · z∈[-67,+91]mm · 8 of 160 slices shown (2 of 2)]
[im 1/160]
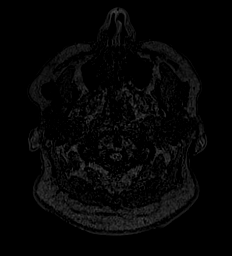
[im 27/160]
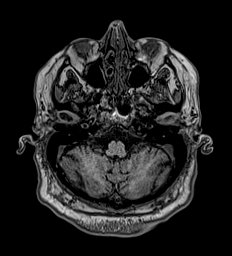
[im 54/160]
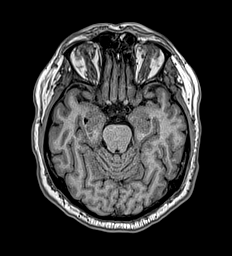
[im 67/160]
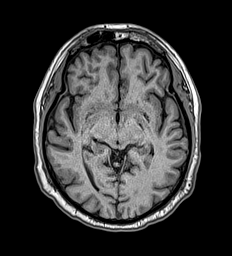
[im 93/160]
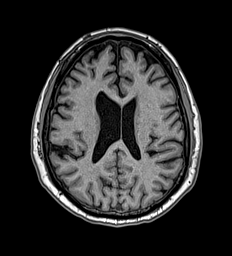
[im 107/160]
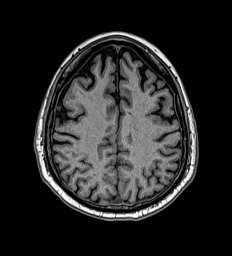
[im 133/160]
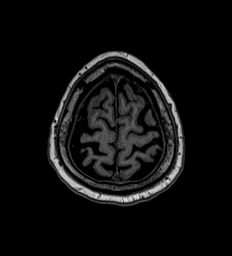
[im 160/160]
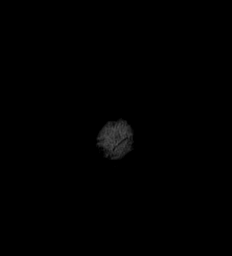

[Series 17: T2 · coronal · 5.0mm · 0.57mm/px · 2 of 29 slices shown (2 of 2)]
[im 1/29]
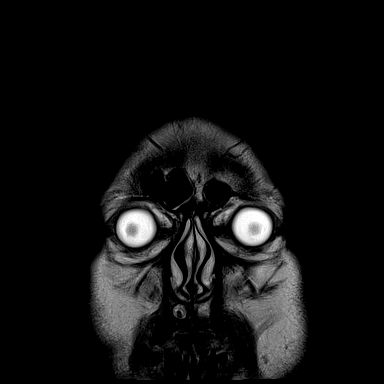
[im 29/29]
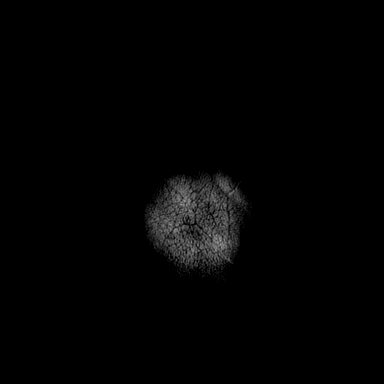

[41 of 48 positions shown; findings below may reference images not displayed]

FINDINGS: Brain: No restricted diffusion to suggest acute infarction. No
midline shift, mass effect, evidence of mass lesion,
ventriculomegaly, extra-axial collection or acute intracranial
hemorrhage. Cervicomedullary junction and pituitary are within
normal limits.

Small area of chronic cortical encephalomalacia in the left middle
frontal gyrus (series 15, images 39 and 41). But otherwise largely
normal for age gray and white matter signal throughout the brain. No
definite chronic cerebral blood products. And no other cortical
encephalomalacia identified. No signal abnormality identified in the
brainstem.

Vascular: Major intracranial vascular flow voids are preserved.

Skull and upper cervical spine: Negative visible cervical spine.
Normal bone marrow signal.

Sinuses/Orbits: Negative visible orbits. Paranasal sinuses and
mastoids are well pneumatized.

Other: Visible internal auditory structures appear normal. Scalp and
face soft tissues appear negative.
IMPRESSION: 1. No acute intracranial abnormality.
2. Small chronic cortical infarcts suspected in the left middle
frontal gyrus, but otherwise essentially normal for age non-contrast
MRI appearance of the brain.

## 2019-04-10 IMAGING — CT CT HEAD W/O CM
4 series · 16 of 47 positions shown, 18 images · non-contrast
Comparison: [DATE]

CLINICAL DATA: Dysarthria and dysphagia

EXAM:
CT HEAD WITHOUT CONTRAST
TECHNIQUE: Contiguous axial images were obtained from the base of the skull
through the vertex without intravenous contrast.

[Series 2: head wo · axial · 0.47mm/px · z∈[-124,-4]mm · 7 of 32 slices shown, 9 images]
[im 4/32  brain]
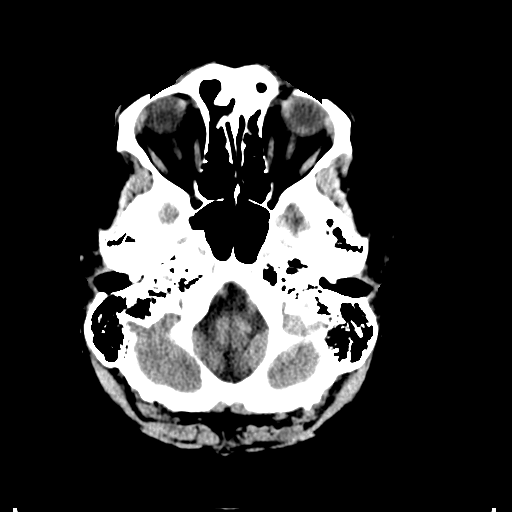
[im 4/32  bone]
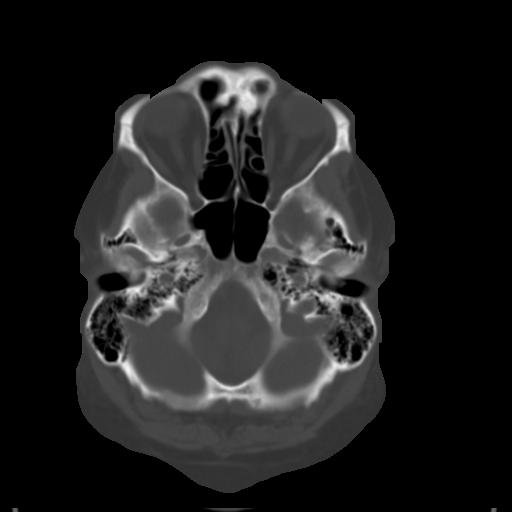
[im 8/32  brain]
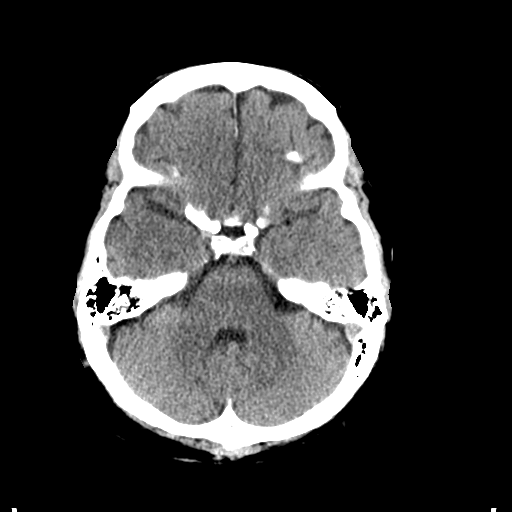
[im 12/32  brain]
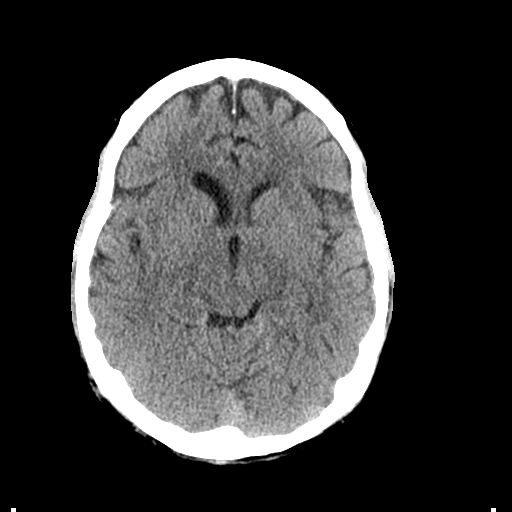
[im 16/32  brain]
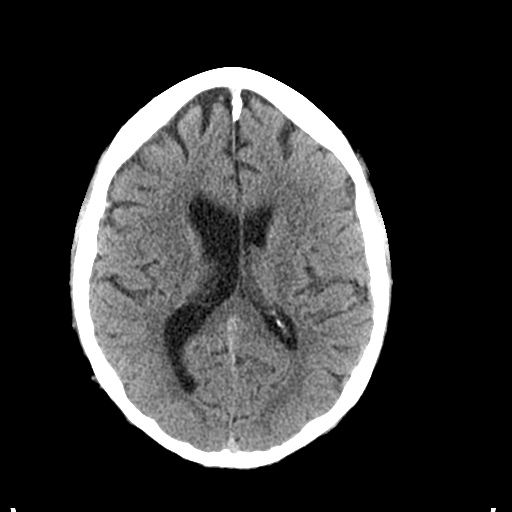
[im 20/32  brain]
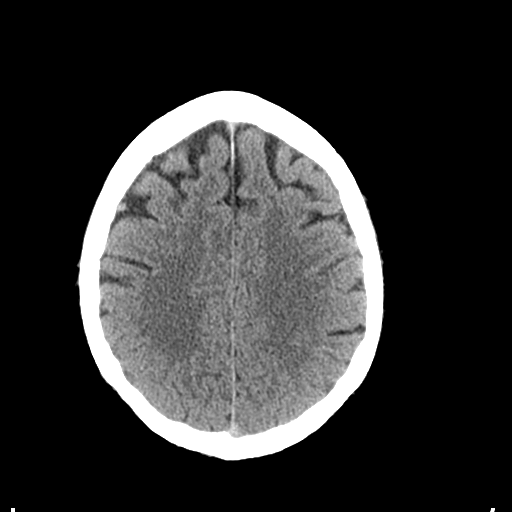
[im 20/32  bone]
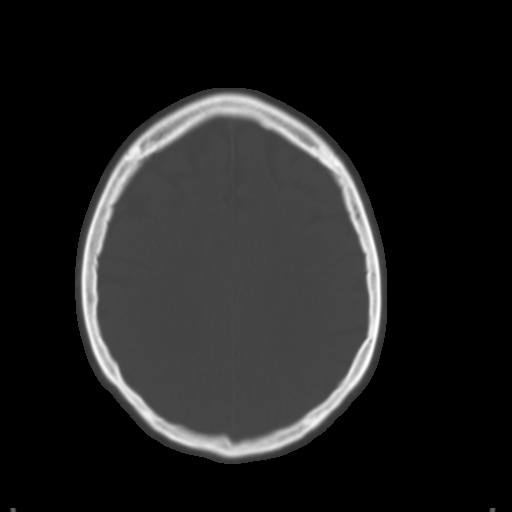
[im 24/32  brain]
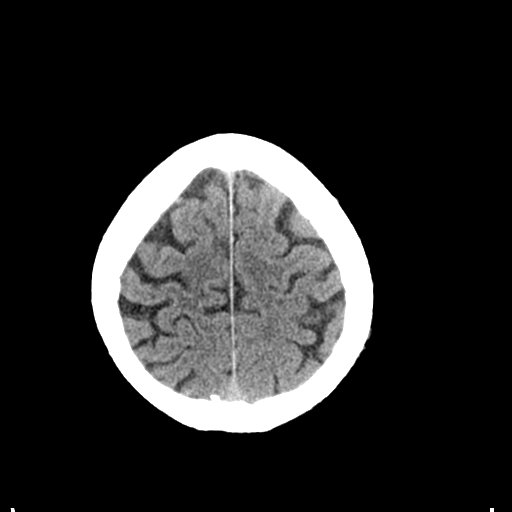
[im 28/32  brain]
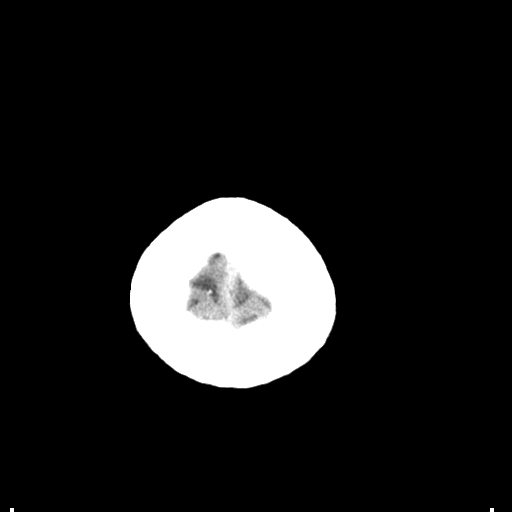

[Series 3: head bone · axial · 0.47mm/px · z∈[-125,-93]mm · 3 of 78 slices shown]
[im 8/78  bone]
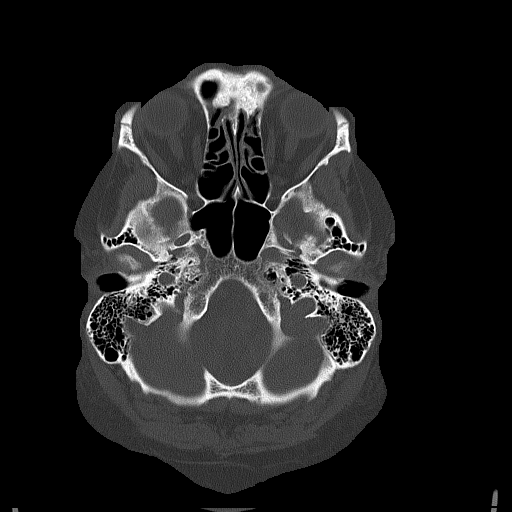
[im 16/78  bone]
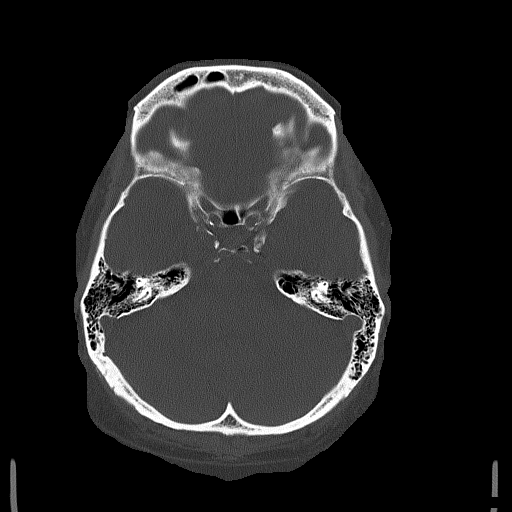
[im 24/78  bone]
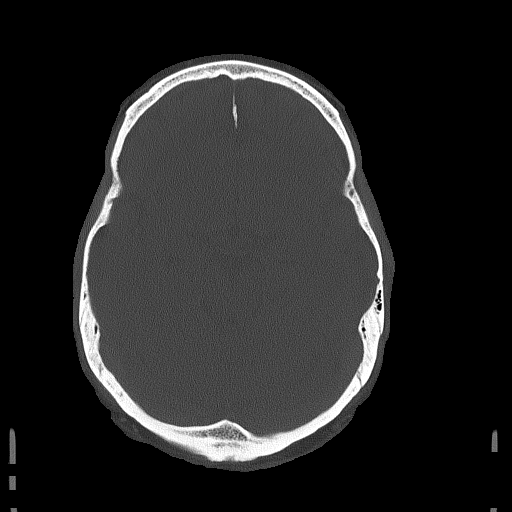

[Series 4: coronal soft tissue · coronal · 0.32mm/px · 3 of 68 slices shown]
[im 23/68  brain]
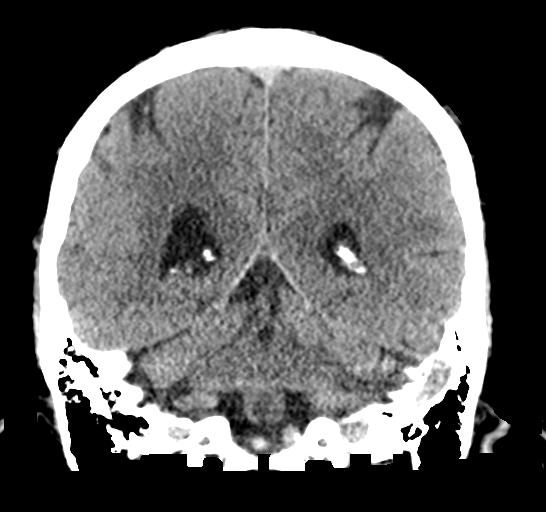
[im 30/68  brain]
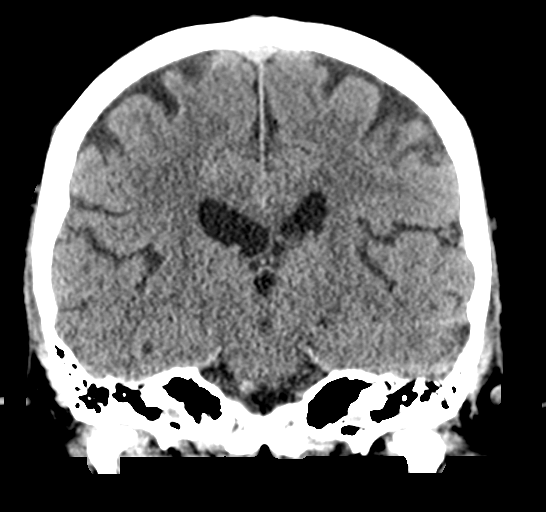
[im 38/68  brain]
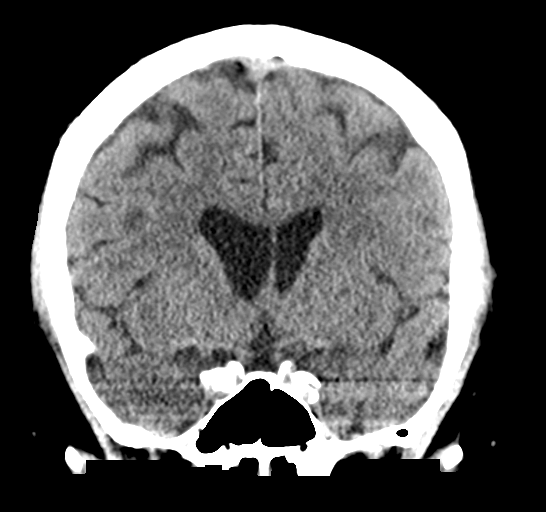

[Series 5: sagittal soft tissue · sagittal · 0.32mm/px · 3 of 57 slices shown]
[im 19/57  brain]
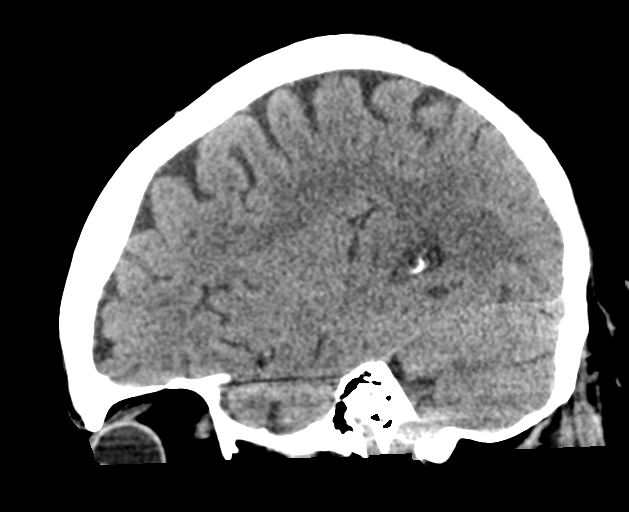
[im 29/57  brain]
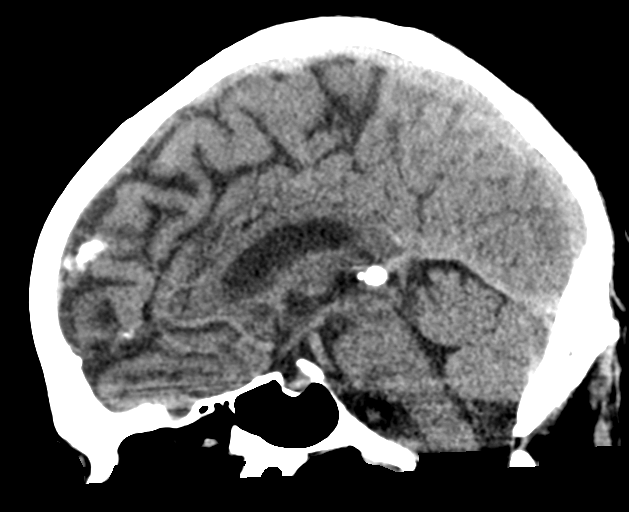
[im 38/57  brain]
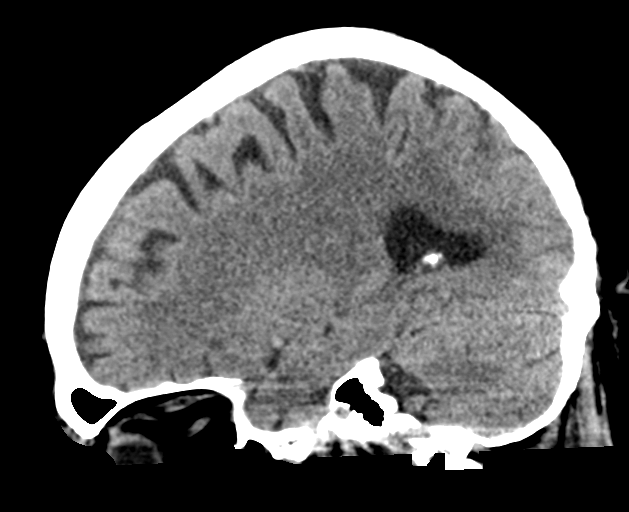

[16 of 47 positions shown; findings below may reference images not displayed]

FINDINGS: Brain: The ventricles and sulci appear within normal limits. The
right lateral ventricle is larger than the left lateral ventricle, a
stable finding that presumably represents an anatomic variant. There
is no intracranial mass, hemorrhage, extra-axial fluid collection,
or midline shift. Brain parenchyma appears unremarkable. No acute
infarct evident.

Vascular: No evident hyperdense vessel. There is calcification in
each carotid siphon region and distal left vertebral artery.

Skull: The bony calvarium appears intact.

Sinuses/Orbits: There is mucosal thickening in several ethmoid air
cells. Other paranasal sinuses which are visualized are clear.
Visualized orbits appear symmetric bilaterally.

Other: Visualized mastoid air cells are clear. There is debris in
the left external auditory canal.
IMPRESSION: Brain parenchyma appears unremarkable.  No mass or hemorrhage.

There are foci of arterial vascular calcification. There is mucosal
thickening in several ethmoid air cells. There is probable cerumen
in the left external auditory canal.

## 2019-04-10 MED ORDER — ERYTHROMYCIN 5 MG/GM OP OINT: 1.0000 "application " | TOPICAL_OINTMENT | Freq: Every day | OPHTHALMIC | 0 refills | Status: DC

## 2019-04-10 MED ORDER — PREDNISONE 20 MG PO TABS: 60.0000 mg | ORAL_TABLET | Freq: Every day | ORAL | 0 refills | Status: AC

## 2019-04-10 NOTE — ED Triage Notes (Signed)
Pt reports that Monday he awoke with tingling lips. Since has had trouble speaking, trouble swallowing, his PCP sent him here.  Able to ambulate without difficulty, uses cane. Denies CP or SOB. Pt alert and oriented X4, cooperative, RR even and unlabored, color WNL. Pt in NAD.

## 2019-04-10 NOTE — ED Notes (Signed)
Pt denies any needs. Declined warm blanket. Pt joked about food but understands he cannot have any until MRI results back and EDP gives approval. Unplugged momentarily as he stated he needed to use the restroom. Steady.

## 2019-04-10 NOTE — ED Notes (Signed)
EDP Jess[place] completing NIH scale.

## 2019-04-10 NOTE — ED Notes (Signed)
Pt sitting up calmly in bed; alert; talking with provider. In NAD.

## 2019-04-10 NOTE — ED Notes (Signed)
MRI staff talking with pt.

## 2019-04-10 NOTE — ED Provider Notes (Signed)
[facility] Emergency Department Provider Note   ____________________________________________   First MD Initiated Contact with Patient 04/10/19 1737     (approximate)  I have reviewed the triage vital signs and the nursing notes.   HISTORY  Chief Complaint Aphasia and Dysphagia    HPI [name] is a 68 y.o. male with past medical history of CAD, CHF, hypertension, diabetes, A. fib on Eliquis who presents to the ED for facial droop and difficulty speaking.  Patient reports that starting 4 days ago he noticed some numbness and tingling around the right side of his lips and extending over his right face.  He states it is made it difficult for him to talk as he cannot enunciate and make his lips do what he wants them to do.  He denies any difficulty swallowing and has been eating and drinking without difficulty, has not noticed any difficulty moving his tongue.  He denies any changes in his vision and has not had any numbness or weakness in his extremities.  He was seen at his PCPs office earlier today and referred to the ED for further evaluation.        Past Medical History:  Diagnosis Date  . Cardiomyopathy ([place])   . Chronic combined systolic (congestive) and diastolic (congestive) heart failure (HCC)    a. EF 25% by cath in 2013 b. Echo in 07/2015 showing EF of 15-20%, moderate MR, moderate Pulm HTN, severely dilated IVC  . CKD (chronic kidney disease) stage 3, GFR 30-59 ml/min   . Coronary artery disease   . Diabetes mellitus type 2, uncontrolled ([place])    a. A1c 11.0 in 07/2015.  [name] Hypertension   . Kidney stones    Left  . New onset atrial fibrillation ([place])    a. diagnosed in 07/2015 b. started on Eliquis  . Paroxysmal atrial fibrillation (HCC)   . Pollen allergies 09-21-15   pt called and stated that he woke up and had some drainage-pt states he did not see the color of the drainage-pt denies running a fever and this only happened once-pt  instructed to call Dr Audree Bane office if he starts running a fever or if the color of drainage becomes yellow/green  . Psoriasis   . Pulmonary hypertension ([place])   . Renal disorder    kidney stone  . Sleep apnea     Patient Active Problem List   Diagnosis Date Noted  . Chronic diastolic heart failure ([place]) 05/12/2018  . Chronic heart failure with preserved ejection fraction (HFpEF) ([place]) 12/05/2017  . Diabetes mellitus ([place]) 12/05/2017  . NICM (nonischemic cardiomyopathy) ([place]) 04/18/2017  . Non-rheumatic mitral regurgitation 04/18/2017  . Chronic kidney disease, stage III (moderate) 04/18/2017  . Left ureteral stone 10/25/2015  . Coronary artery disease involving native coronary artery of native heart without angina pectoris 09/22/2015  . First degree AV block 09/22/2015  . Essential hypertension 09/22/2015  . Hyperlipidemia LDL goal <70 09/22/2015  . Hypotension 08/09/2015  . Chronic systolic heart failure ([place]) 08/08/2015  . Hydronephrosis, left   . Kidney stone on left side   . OSA (obstructive sleep apnea)   . Hyponatremia 07/13/2015  . Uncontrolled type 2 diabetes mellitus ([place]) 07/13/2015  . Pulmonary hypertension ([place])   . Cardiorenal syndrome   . Morbid obesity due to excess calories ([place])   . Paroxysmal atrial fibrillation [facility])     Past Surgical History:  Procedure Laterality Date  . CARDIAC CATHETERIZATION N/A 07/25/2015   Procedure:  Right/Left Heart Cath and Coronary Angiography;  Surgeon: Wellington Hampshire, MD;  Location: [facility];  Service: Cardiovascular;  Laterality: N/A;  . CARDIAC CATHETERIZATION     [place]  . CARDIAC CATHETERIZATION     [facility] [place]    . CYSTOSCOPY WITH STENT PLACEMENT Left 09/07/2015   Procedure: CYSTOSCOPY WITH STENT PLACEMENT;  Surgeon: Hollice Espy, MD;  Location: [facility];  Service: Urology;  Laterality: Left;  . CYSTOSCOPY/URETEROSCOPY/HOLMIUM LASER/STENT  PLACEMENT Left 10/25/2015   Procedure: CYSTOSCOPY/URETEROSCOPY/HOLMIUM LASER/STENT EXCHANGE;  Surgeon: Hollice Espy, MD;  Location: [facility];  Service: Urology;  Laterality: Left;  . EXTRACORPOREAL SHOCK WAVE LITHOTRIPSY Left 03/05/2019   Procedure: EXTRACORPOREAL SHOCK WAVE LITHOTRIPSY (ESWL);  Surgeon: Abbie Sons, MD;  Location: [facility];  Service: Urology;  Laterality: Left;  . EXTRACORPOREAL SHOCK WAVE LITHOTRIPSY Left 04/02/2019   Procedure: EXTRACORPOREAL SHOCK WAVE LITHOTRIPSY (ESWL);  Surgeon: Hollice Espy, MD;  Location: [facility];  Service: Urology;  Laterality: Left;  . KIDNEY SURGERY    . NEPHROSTOMY TUBE PLACEMENT ([place] HX) Left   . URETEROSCOPY WITH HOLMIUM LASER LITHOTRIPSY Left 09/07/2015   Procedure: URETEROSCOPY WITH HOLMIUM LASER LITHOTRIPSY;  Surgeon: Hollice Espy, MD;  Location: [facility];  Service: Urology;  Laterality: Left;    Prior to Admission medications   Medication Sig Start Date End Date Taking? Authorizing Provider  amiodarone (PACERONE) 200 MG tablet Take 1 tablet (200 mg total) by mouth daily. Patient taking differently: Take 200 mg by mouth every morning.  08/01/15   Hower, Aaron Mose, MD  apixaban (ELIQUIS) 5 MG TABS tablet Take 1 tablet (5 mg total) 2 (two) times daily by mouth. 04/17/17   End, Harrell Gave, MD  b complex vitamins tablet Take 1 tablet by mouth daily.    [provider]  baclofen (LIORESAL) 10 MG tablet Take 10 mg by mouth daily.    [provider]  carvedilol (COREG) 6.25 MG tablet TAKE 1 TABLET BY MOUTH TWICE DAILY 10/26/16   End, Harrell Gave, MD  clonazePAM Bobbye [place]) 1 MG tablet  01/23/17   [provider]  Coenzyme Q10 (CO Q 10 PO) Take 400 mg by mouth daily.    [provider]  Cranberry 500 MG CAPS Take 1,000 mg by mouth daily.    [provider]  docusate sodium (COLACE) 100 MG capsule Take 1 capsule (100 mg total) by mouth 2 (two) times daily. 03/05/19   Hollice Espy, MD  erythromycin  ophthalmic ointment Place 1 application into the right eye at bedtime. 04/10/19   Blake Divine, MD  furosemide (LASIX) 80 MG tablet Take 1 tablet (80 mg total) by mouth daily. 12/17/17   End, Harrell Gave, MD  glipiZIDE (GLUCOTROL) 10 MG tablet Take 10 mg by mouth 2 (two) times daily.     [provider]  Cathrine Muster 550 MG CAPS Take 550 mg by mouth daily.    [provider]  HYDROcodone-acetaminophen (NORCO/VICODIN) 5-325 MG tablet Take 1 tablet by mouth every 4 (four) hours as needed for moderate pain. Patient not taking: Reported on 04/02/2019 02/06/19   Johnn Hai, PA-C  Insulin Glargine, 2 Unit Dial, (TOUJEO MAX SOLOSTAR) 300 UNIT/ML SOPN Inject 20 Units into the skin daily. Titrate up to 60 units/day, per endocrinology instructions. 05/05/18   [provider]  JARDIANCE 10 MG TABS tablet 10 mg daily.  04/28/18   [provider]  losartan (COZAAR) 25 MG tablet Take 25 mg by mouth  daily.  04/28/18   [provider]  montelukast (SINGULAIR) 10 MG tablet  01/27/18   [provider]  Multiple Vitamin (MULTI-VITAMINS) TABS Take by mouth.    [provider]  multivitamin-lutein (OCUVITE-LUTEIN) CAPS capsule Take 1 capsule by mouth 2 (two) times daily.    [provider]  OVER THE COUNTER MEDICATION Take 3 capsules by mouth 2 (two) times daily. Reported on 12/23/2015    [provider]  OVER THE COUNTER MEDICATION Take 1 capsule by mouth daily. Pt takes caprylic acid.    [provider]  oxyCODONE-acetaminophen (PERCOCET) 5-325 MG tablet Take 1-2 tablets by mouth every 4 (four) hours as needed for moderate pain or severe pain. 03/05/19   Hollice Espy, MD  OZEMPIC, 1 MG/DOSE, 2 MG/1.5ML [facility]  01/27/19   [provider]  polyethylene glycol (MIRALAX / GLYCOLAX) packet Take 17 g by mouth daily as needed for mild constipation.    [provider]  potassium phosphate, monobasic, (K-PHOS  ORIGINAL) 500 MG tablet Take 1 tablet (500 mg total) by mouth daily. 09/29/15   Alisa Graff, FNP  predniSONE (DELTASONE) 20 MG tablet Take 3 tablets (60 mg total) by mouth daily for 7 days. 04/10/19 04/17/19  Blake Divine, MD  rosuvastatin (CRESTOR) 20 MG tablet  01/27/19   [provider]  tamsulosin (FLOMAX) 0.4 MG CAPS capsule Take 1 capsule (0.4 mg total) by mouth daily. 02/06/19   Johnn Hai, PA-C  TURMERIC CURCUMIN PO Take 1 capsule by mouth daily.    [provider]  vitamin C (ASCORBIC ACID) 500 MG tablet Take 1,000 mg by mouth daily.    [provider]  Vitamin D, Ergocalciferol, (DRISDOL) 1.25 MG (50000 UT) CAPS capsule  02/26/18   [provider]  vitamin E 400 UNIT capsule Take 400 Units by mouth daily.    [provider]    Allergies Other  Family History  Problem Relation Age of Onset  . Heart failure Father   . Heart attack Brother   . Kidney cancer Neg Hx   . Bladder Cancer Neg Hx   . Prostate cancer Neg Hx     Social History Social History   Tobacco Use  . Smoking status: Never Smoker  . Smokeless tobacco: Never Used  Substance Use Topics  . Alcohol use: Yes    Alcohol/week: 0.0 standard drinks    Comment: occasionally  . Drug use: No    Types: Marijuana, Cocaine, Opium, LSD    Comment: Past, greater than 10 years ago    Review of Systems  Constitutional: No fever/chills Eyes: No visual changes. ENT: No sore throat. Cardiovascular: Denies chest pain. Respiratory: Denies shortness of breath. Gastrointestinal: No abdominal pain.  No nausea, no vomiting.  No diarrhea.  No constipation. Genitourinary: Negative for dysuria. Musculoskeletal: Negative for back pain. Skin: Negative for rash. Neurological: Negative for headaches.  Positive for facial numbness and weakness.  ____________________________________________   PHYSICAL EXAM:  VITAL SIGNS: ED Triage Vitals [04/10/19 1336]  Enc Vitals Group      BP 103/61     Pulse Rate 77     Resp 18     Temp 98.6 F (37 C)     Temp Source Oral     SpO2 97 %     Weight 125 lb (56.7 kg)     Height 6' (1.829 m)     Head Circumference      Peak Flow  Pain Score 0     Pain Loc      Pain Edu?      Excl. in [place]?     Constitutional: Alert and oriented. Eyes: Conjunctivae are normal. Head: Atraumatic. Nose: No congestion/rhinnorhea. Mouth/Throat: Mucous membranes are moist. Neck: Normal ROM Cardiovascular: Normal rate, regular rhythm. Grossly normal heart sounds. Respiratory: Normal respiratory effort.  No retractions. Lungs CTAB. Gastrointestinal: Soft and nontender. No distention. Genitourinary: deferred Musculoskeletal: No lower extremity tenderness nor edema. Neurologic:  Normal speech and language.  Right-sided facial droop that involves patient's right upper face.  No dysarthria or word finding difficulties.  5 out of 5 strength in bilateral upper and lower extremities with no pronator drift.  Sensation intact throughout extremities. Skin:  Skin is warm, dry and intact. No rash noted. Psychiatric: Mood and affect are normal. Speech and behavior are normal.  ____________________________________________   LABS (all labs ordered are listed, but only abnormal results are displayed)  Labs Reviewed  COMPREHENSIVE METABOLIC PANEL - Abnormal; Notable for the following components:      Result Value   Glucose, Bld 204 (*)    BUN 28 (*)    Creatinine, Ser 1.58 (*)    GFR calc non Af Amer 45 (*)    GFR calc Af Amer 52 (*)    All other components within normal limits  PROTIME-INR - Abnormal; Notable for the following components:   Prothrombin Time 17.5 (*)    INR 1.5 (*)    All other components within normal limits  CBC  DIFFERENTIAL  APTT  CBG MONITORING, ED   ____________________________________________  EKG  ED ECG REPORT I, Blake Divine, the attending physician, personally viewed and interpreted this ECG.   Date:  04/10/2019  EKG Time: 13:42  Rate: 75  Rhythm: normal sinus rhythm  Axis: LAD  Intervals:nonspecific intraventricular conduction delay  ST&T Change: None   PROCEDURES  Procedure(s) performed (including Critical Care):  Procedures   ____________________________________________   INITIAL IMPRESSION / ASSESSMENT AND PLAN / ED COURSE       68 year old male presents to the ED with 4 days of right-sided facial weakness as well as numbness and tingling.  [name] outside of the window for TPA or endovascular intervention, no indication for code stroke.  Head CT was performed and negative for acute process.  On exam, his neurologic deficits appear most consistent with a Bell's palsy given involvement of his right upper face and no focal findings in his extremities.  However, he does complain of some numbness and has multiple risk factors for stroke.  Will perform MRI to rule out acute stroke.  EKG and labs are unremarkable.  MRI is negative for acute stroke confirming that patient's facial droop is secondary to Bell's palsy rather than central process.  Will prescribe course of steroids and counseled patient on close monitoring of his glucose levels during this.  We will also prescribe erythromycin ointment to be placed in his eye at night and counseled to use artificial tears during the day.  Counseled patient to follow-up with ophthalmology and his PCP, otherwise return to the ED for new or worsening symptoms.  Patient agrees with plan.  ____________________________________________   FINAL CLINICAL IMPRESSION(S) / ED DIAGNOSES  Final diagnoses:  Bell's palsy  Facial weakness     ED Discharge Orders         Ordered    predniSONE (DELTASONE) 20 MG tablet  Daily     04/10/19 2202    erythromycin ophthalmic  ointment  Daily at bedtime     04/10/19 2202           Note:  This document was prepared using Dragon voice recognition software and may include unintentional dictation  errors.   Blake Divine, MD 04/10/19 2204

## 2019-04-10 NOTE — ED Notes (Signed)
EDP [place] at bedside 

## 2019-04-10 NOTE — ED Notes (Addendum)
Pt A&OX4. States slight change in speech; slight difficulty with swallowing; slight tingling in lips since Monday. States it is extremely subtle but he can still notice it. Speech currently clear and easy to understand. Face symmetrical. Good grip to both hands.

## 2019-04-10 NOTE — ED Notes (Signed)
Pt signed printed d/c paperwork.  

## 2019-04-10 NOTE — ED Notes (Signed)
Pt leaving for MRI.  

## 2019-04-10 NOTE — ED Notes (Signed)
Delay explained. Pt calm and understanding. Denies any needs currently.

## 2019-04-10 NOTE — ED Notes (Signed)
Pt back from MRI 

## 2019-04-17 ENCOUNTER — Ambulatory Visit
Admission: RE | Admit: 2019-04-17 | Discharge: 2019-04-17 | Disposition: A | Discharge status: Home / Self Care | Payer: Medicare Other | Attending: Urology | Admitting: Urology

## 2019-04-17 ENCOUNTER — Other Ambulatory Visit: Payer: Self-pay

## 2019-04-17 ENCOUNTER — Ambulatory Visit
Admission: RE | Admit: 2019-04-17 | Discharge: 2019-04-17 | Disposition: A | Discharge status: Home / Self Care | Payer: Medicare Other | Source: Ambulatory Visit | Attending: Urology | Admitting: Urology

## 2019-04-17 ENCOUNTER — Encounter: Payer: Self-pay | Admitting: Urology

## 2019-04-17 ENCOUNTER — Ambulatory Visit (INDEPENDENT_AMBULATORY_CARE_PROVIDER_SITE_OTHER): Payer: Medicare Other | Admitting: Urology

## 2019-04-17 VITALS — BP 111/66 | HR 84 | Ht 72.0 in | Wt 275.0 lb

## 2019-04-17 DIAGNOSIS — N200 Calculus of kidney: Secondary | ICD-10-CM | POA: Insufficient documentation

## 2019-04-17 DIAGNOSIS — N201 Calculus of ureter: Secondary | ICD-10-CM

## 2019-04-17 DIAGNOSIS — N261 Atrophy of kidney (terminal): Secondary | ICD-10-CM

## 2019-04-17 IMAGING — CR DG ABDOMEN 1V
2 series · 2 of 2 positions shown · non-contrast
Comparison: [DATE]

CLINICAL DATA: Left-sided nephrolithiasis

EXAM:
ABDOMEN - 1 VIEW

[abdomen kub (1 of 2)]
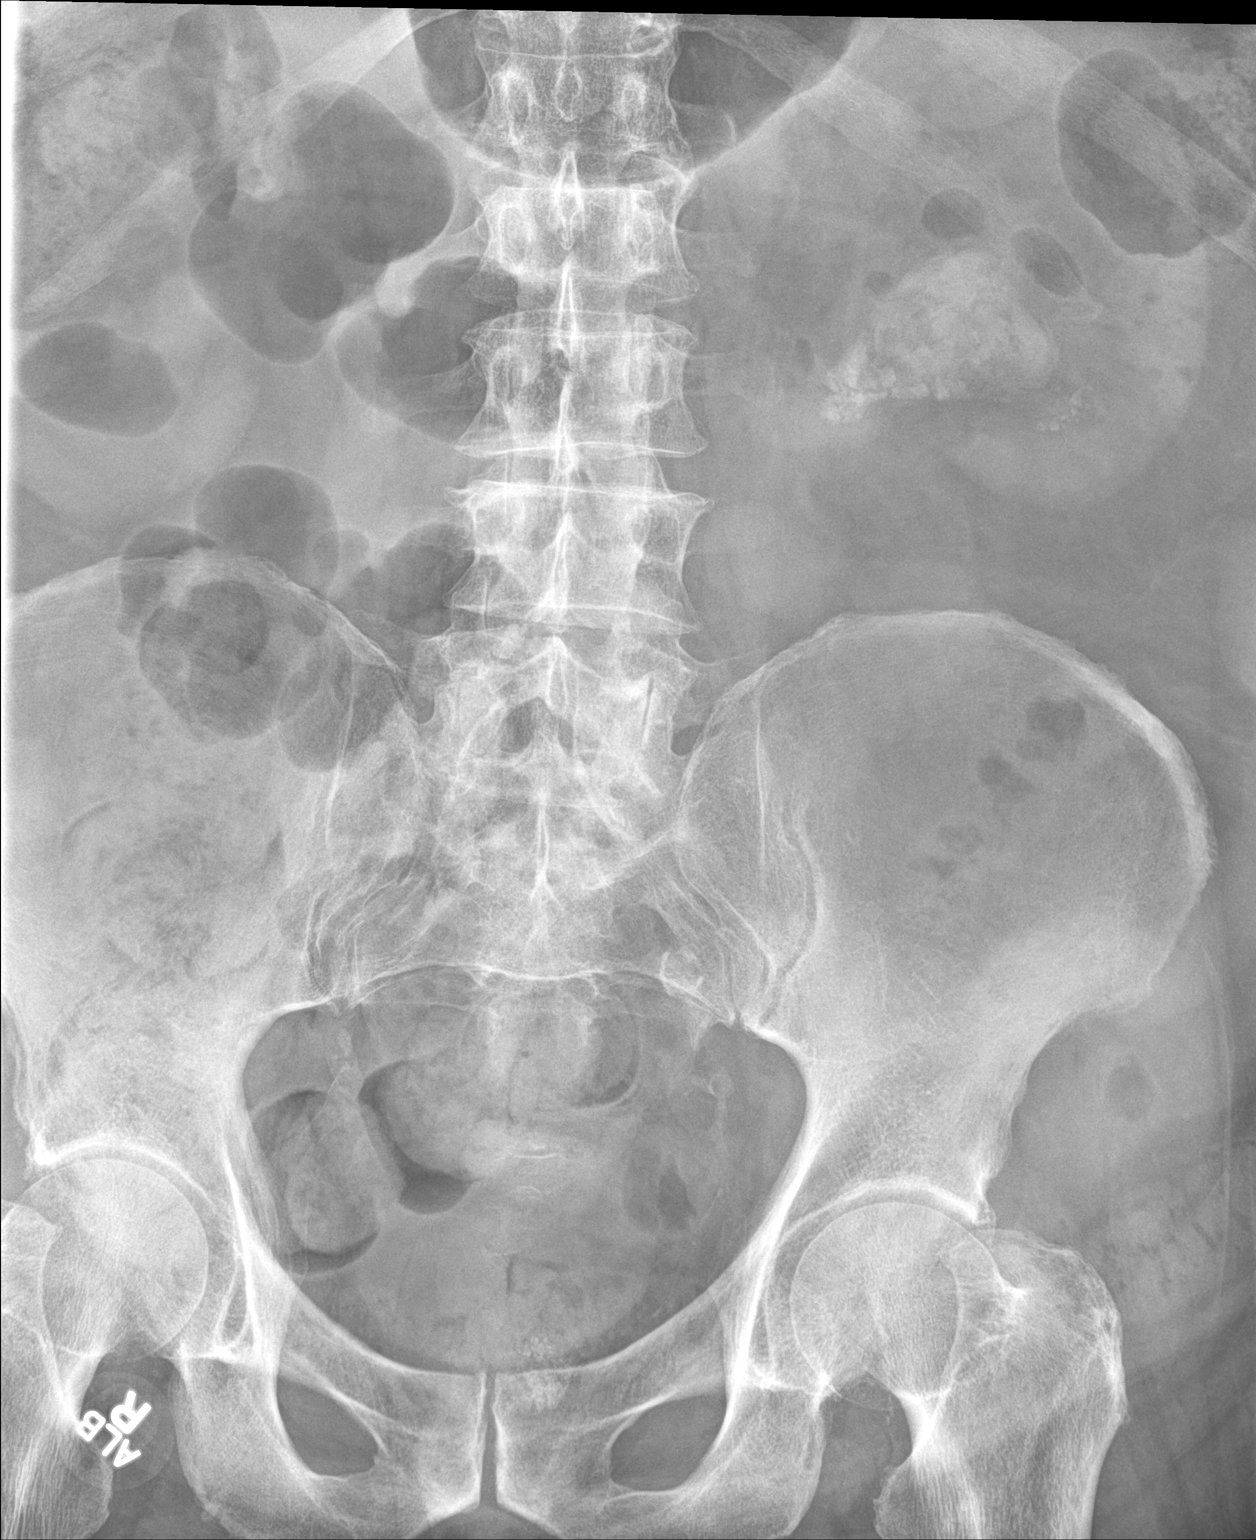

[abdomen kub (2 of 2)]
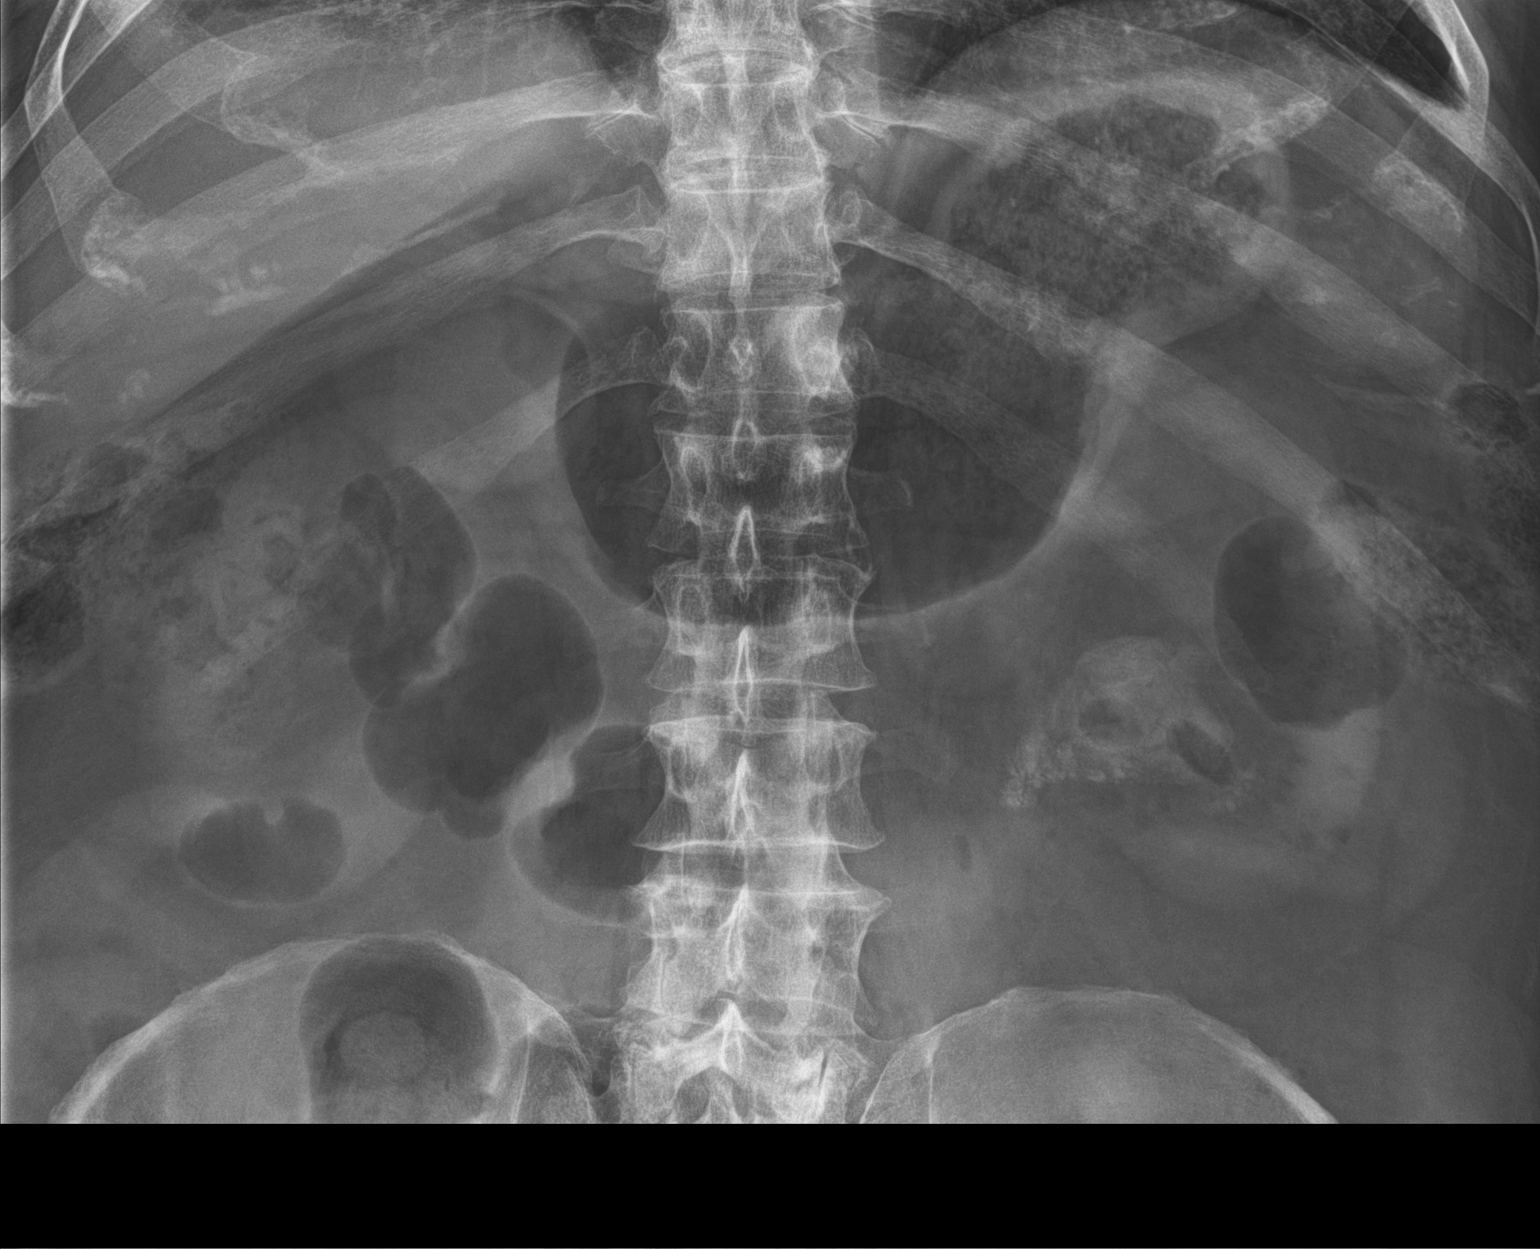

[2 of 2 positions shown; findings below may reference images not displayed]

FINDINGS: Again identified are extensive calcifications involving the left
kidney that appear to be centered within the left renal pelvis. The
overall stone burden appears to be relatively similar when compared
to prior study. There are calcifications that are located in the
pelvis that are favored represent prostate calcifications are
phleboliths. There are no convincing stones along the course of the
left ureter. The left renal collecting system, specifically the
pelvis, appears to be more dilated than the prior study raising
concern for left-sided hydronephrosis.
IMPRESSION: 1. Persistent large left-sided stone burden.
2. Findings suggestive of persistent left-sided hydronephrosis which
may have worsened since the prior study. This can be further
evaluated with ultrasound or CT as clinically indicated.

These results will be called to the ordering clinician or
representative by the Radiologist Assistant, and communication
documented in the PACS or zVision Dashboard.

## 2019-04-17 NOTE — H&P (View-Only) (Signed)
04/17/2019 2:55 PM   [place] 1951-04-21 [id number]  Referring provider: Vidal Schwalbe, MD [address],  [place] [address]  Chief Complaint  Patient presents with  . Nephrolithiasis    2wk post ESWL    HPI: 68 year old male with large left renal pelvic stone/left renal atrophy he returns today for follow-up.  [name] undergone ESWL x2 with fairly good fragmentation of his very large stone.  He reports after this most recent ESWL 2 weeks ago, he passed copious amount of sand-like material.  No larger stones.  No flank pain.  In the interim, he was seen in the emergency room for Bell's palsy.  He reports he is on prednisone now improving.  He has no significant urinary symptoms.  Please see previous notes for details.   PMH: Past Medical History:  Diagnosis Date  . Cardiomyopathy ([place])   . Chronic combined systolic (congestive) and diastolic (congestive) heart failure (HCC)    a. EF 25% by cath in 2013 b. Echo in 07/2015 showing EF of 15-20%, moderate MR, moderate Pulm HTN, severely dilated IVC  . CKD (chronic kidney disease) stage 3, GFR 30-59 ml/min   . Coronary artery disease   . Diabetes mellitus type 2, uncontrolled ([place])    a. A1c 11.0 in 07/2015.  [name] Hypertension   . Kidney stones    Left  . New onset atrial fibrillation ([place])    a. diagnosed in 07/2015 b. started on Eliquis  . Paroxysmal atrial fibrillation (HCC)   . Pollen allergies 09-21-15   pt called and stated that he woke up and had some drainage-pt states he did not see the color of the drainage-pt denies running a fever and this only happened once-pt instructed to call Dr Audree Bane office if he starts running a fever or if the color of drainage becomes yellow/green  . Psoriasis   . Pulmonary hypertension ([place])   . Renal disorder    kidney stone  . Sleep apnea     Surgical History: Past Surgical History:  Procedure Laterality Date  . CARDIAC CATHETERIZATION N/A 07/25/2015   Procedure: Right/Left Heart Cath and Coronary Angiography;  Surgeon: Wellington Hampshire, MD;  Location: [place] [facility];  Service: Cardiovascular;  Laterality: N/A;  . CARDIAC [place]HE[place]IZATION     [place]  . CARDIAC CATHETERIZATION     [facility], [place]    . CYSTOSCOPY WITH STENT PLACEMENT Left 09/07/2015   Procedure: CYSTOSCOPY WITH STENT PLACEMENT;  Surgeon: Hollice Espy, MD;  Location: [facility];  Service: Urology;  Laterality: Left;  . CYSTOSCOPY/URETEROSCOPY/HOLMIUM LASER/STENT PLACEMENT Left 10/25/2015   Procedure: CYSTOSCOPY/URETEROSCOPY/HOLMIUM LASER/STENT EXCHANGE;  Surgeon: Hollice Espy, MD;  Location: [facility];  Service: Urology;  Laterality: Left;  . EXTRACORPOREAL SHOCK WAVE LITHOTRIPSY Left 03/05/2019   Procedure: EXTRACORPOREAL SHOCK WAVE LITHOTRIPSY (ESWL);  Surgeon: Abbie Sons, MD;  Location: [facility];  Service: Urology;  Laterality: Left;  . EXTRACORPOREAL SHOCK WAVE LITHOTRIPSY Left 04/02/2019   Procedure: EXTRACORPOREAL SHOCK WAVE LITHOTRIPSY (ESWL);  Surgeon: Hollice Espy, MD;  Location: [facility];  Service: Urology;  Laterality: Left;  . KIDNEY SURGERY    . NEPHROSTOMY TUBE PLACEMENT ([place] HX) Left   . URETEROSCOPY WITH HOLMIUM LASER LITHOTRIPSY Left 09/07/2015   Procedure: URETEROSCOPY WITH HOLMIUM LASER LITHOTRIPSY;  Surgeon: Hollice Espy, MD;  Location: [facility];  Service: Urology;  Laterality: Left;    Home Medications:  Allergies as of 04/17/2019  Reactions   Other Hives   Blue cheese      Medication List       Accurate as of April 17, 2019  2:55 PM. If you have any questions, ask your nurse or doctor.        STOP taking these medications   HYDROcodone-acetaminophen 5-325 MG tablet Commonly known as: NORCO/VICODIN Stopped by: Hollice Espy, MD     TAKE these medications   amiodarone 200 MG tablet Commonly known as: PACERONE Take 1 tablet (200 mg total) by mouth daily. What  changed: when to take this   apixaban 5 MG Tabs tablet Commonly known as: Eliquis Take 1 tablet (5 mg total) 2 (two) times daily by mouth.   b complex vitamins tablet Take 1 tablet by mouth daily.   baclofen 10 MG tablet Commonly known as: LIORESAL Take 10 mg by mouth daily.   carvedilol 6.25 MG tablet Commonly known as: COREG TAKE 1 TABLET BY MOUTH TWICE DAILY   clonazePAM 1 MG tablet Commonly known as: KLONOPIN   CO Q 10 PO Take 400 mg by mouth daily.   Cranberry 500 MG Caps Take 1,000 mg by mouth daily.   docusate sodium 100 MG capsule Commonly known as: COLACE Take 1 capsule (100 mg total) by mouth 2 (two) times daily.   erythromycin ophthalmic ointment Place 1 application into the right eye at bedtime.   furosemide 80 MG tablet Commonly known as: LASIX Take 1 tablet (80 mg total) by mouth daily.   glipiZIDE 10 MG tablet Commonly known as: GLUCOTROL Take 10 mg by mouth 2 (two) times daily.   Hawthorne Berry 550 MG Caps Take 550 mg by mouth daily.   Jardiance 10 MG Tabs tablet Generic drug: empagliflozin 10 mg daily.   losartan 25 MG tablet Commonly known as: COZAAR Take 25 mg by mouth daily.   montelukast 10 MG tablet Commonly known as: SINGULAIR   Multi-Vitamins Tabs Take by mouth.   multivitamin-lutein Caps capsule Take 1 capsule by mouth 2 (two) times daily.   OVER THE COUNTER MEDICATION Take 3 capsules by mouth 2 (two) times daily. Reported on 12/23/2015   OVER THE COUNTER MEDICATION Take 1 capsule by mouth daily. Pt takes caprylic acid.   oxyCODONE-acetaminophen 5-325 MG tablet Commonly known as: Percocet Take 1-2 tablets by mouth every 4 (four) hours as needed for moderate pain or severe pain.   Ozempic (1 MG/DOSE) 2 MG/1.5ML Sopn Generic drug: Semaglutide (1 MG/DOSE)   polyethylene glycol 17 g packet Commonly known as: MIRALAX / GLYCOLAX Take 17 g by mouth daily as needed for mild constipation.   potassium phosphate (monobasic)  500 MG tablet Commonly known as: K-PHOS ORIGINAL Take 1 tablet (500 mg total) by mouth daily.   predniSONE 20 MG tablet Commonly known as: Deltasone Take 3 tablets (60 mg total) by mouth daily for 7 days.   rosuvastatin 20 MG tablet Commonly known as: CRESTOR   tamsulosin 0.4 MG Caps capsule Commonly known as: FLOMAX Take 1 capsule (0.4 mg total) by mouth daily.   Toujeo Max SoloStar 300 UNIT/ML Sopn Generic drug: Insulin Glargine (2 Unit Dial) Inject 20 Units into the skin daily. Titrate up to 60 units/day, per endocrinology instructions.   TURMERIC CURCUMIN PO Take 1 capsule by mouth daily.   vitamin C 500 MG tablet Commonly known as: ASCORBIC ACID Take 1,000 mg by mouth daily.   Vitamin D (Ergocalciferol) 1.25 MG (50000 UT) Caps capsule Commonly known as: DRISDOL   vitamin  E 400 UNIT capsule Take 400 Units by mouth daily.       Allergies:  Allergies  Allergen Reactions  . Other Hives    Blue cheese    Family History: Family History  Problem Relation Age of Onset  . Heart failure Father   . Heart attack Brother   . Kidney cancer Neg Hx   . Bladder Cancer Neg Hx   . Prostate cancer Neg Hx     Social History:  reports that he has never smoked. He has never used smokeless tobacco. He reports current alcohol use. He reports that he does not use drugs.  ROS: UROLOGY Frequent Urination?: No Hard to postpone urination?: Yes Burning/pain with urination?: No Get up at night to urinate?: Yes Leakage of urine?: No Urine stream starts and stops?: No Trouble starting stream?: No Do you have to strain to urinate?: No Blood in urine?: No Urinary tract infection?: No Sexually transmitted disease?: No Injury to kidneys or bladder?: No Painful intercourse?: No Weak stream?: No Erection problems?: No Penile pain?: No  Gastrointestinal Nausea?: No Vomiting?: No Indigestion/heartburn?: No Diarrhea?: No Constipation?: Yes  Constitutional Fever: No Night  sweats?: No Weight loss?: No Fatigue?: No  Skin Skin rash/lesions?: No Itching?: Yes  Eyes Blurred vision?: Yes Double vision?: No  Ears/Nose/Throat Sore throat?: No Sinus problems?: No  Hematologic/Lymphatic Swollen glands?: No Easy bruising?: Yes  Cardiovascular Leg swelling?: No Chest pain?: No  Respiratory Cough?: No Shortness of breath?: No  Endocrine Excessive thirst?: Yes  Musculoskeletal Back pain?: Yes Joint pain?: No  Neurological Headaches?: No Dizziness?: No  Psychologic Depression?: No Anxiety?: Yes  Physical Exam: BP 111/66   Pulse 84   Ht 6' (1.829 m)   Wt 275 lb (124.7 kg)   BMI 37.30 kg/m   Constitutional:  Alert and oriented, No acute distress. HEENT: [place] AT, moist mucus membranes.  Trachea midline, no masses. Cardiovascular: No clubbing, cyanosis, or edema. Respiratory: Normal respiratory effort, no increased work of breathing. Skin: No rashes, bruises or suspicious lesions. Neurologic: Grossly intact, no focal deficits, moving all 4 extremities. Psychiatric: Normal mood and affect.  Laboratory Data: Lab Results  Component Value Date   WBC 8.3 04/10/2019   HGB 14.1 04/10/2019   HCT 42.2 04/10/2019   MCV 97.2 04/10/2019   PLT 202 04/10/2019    Lab Results  Component Value Date   CREATININE 1.58 (H) 04/10/2019     Lab Results  Component Value Date   HGBA1C 11.0 (H) 07/08/2015    Pertinent Imaging: KUB today personally reviewed and compared to previous.  Large left kidney stone with Swiss cheeselike appearance, multiple fragments up to approximately 5 mm per my read.  There is still a large amount of stone material on the left but improved.  Assessment & Plan:    1. Left renal pelvic stone Fairly decent progress with ESWL x2 but significant residual stone burden on KUB today  I recommended that we give it a few more weeks to allow the interval stone to pass but return for more definitive ureteroscopy to clear to  residual stone burden on the left.  [name] undergone this procedure multiple times and is familiar.  Risk including risk of bleeding, infection, damage chronic structures, stent and stent pain were all discussed.  Staged procedures also discussed as an possibility.  For surgical purposes planning, we will have him get a CT scan a few days before the procedure when he is Covid tested and preop urine culture.  This will help [place] appreciate his overall stone burden as well as potentially cancel the procedure if he does pass significant amount of stone in the interim.  [name] agreeable this plan.  All questions answered. - CT RENAL STONE STUDY; Future  2. Left renal atrophy As per previous, significant left renal atrophy but worth preservation given his overall renal insufficiency - CT RENAL STONE STUDY; Future   Hollice Espy, MD  [facility] [address] [place], [place] [address] [phone]

## 2019-04-17 NOTE — Progress Notes (Signed)
04/17/2019 2:55 PM   [place] 04-Jun-1951 [id number]  Referring provider: Vidal Schwalbe, MD [address] [place],  [place] [address]  Chief Complaint  Patient presents with  . Nephrolithiasis    2wk post ESWL    HPI: 68 year old male with large left renal pelvic stone/left renal atrophy he returns today for follow-up.  [name] undergone ESWL x2 with fairly good fragmentation of his very large stone.  He reports after this most recent ESWL 2 weeks ago, he passed copious amount of sand-like material.  No larger stones.  No flank pain.  In the interim, he was seen in the emergency room for Bell's palsy.  He reports he is on prednisone now improving.  He has no significant urinary symptoms.  Please see previous notes for details.   PMH: Past Medical History:  Diagnosis Date  . Cardiomyopathy ([place])   . Chronic combined systolic (congestive) and diastolic (congestive) heart failure (HCC)    a. EF 25% by cath in 2013 b. Echo in 07/2015 showing EF of 15-20%, moderate MR, moderate Pulm HTN, severely dilated IVC  . CKD (chronic kidney disease) stage 3, GFR 30-59 ml/min   . Coronary artery disease   . Diabetes mellitus type 2, uncontrolled ([place])    a. A1c 11.0 in 07/2015.  [name] Hypertension   . Kidney stones    Left  . New onset atrial fibrillation ([place])    a. diagnosed in 07/2015 b. started on Eliquis  . Paroxysmal atrial fibrillation (HCC)   . Pollen allergies 09-21-15   pt called and stated that he woke up and had some drainage-pt states he did not see the color of the drainage-pt denies running a fever and this only happened once-pt instructed to call Dr Audree Bane office if he starts running a fever or if the color of drainage becomes yellow/green  . Psoriasis   . Pulmonary hypertension ([place])   . Renal disorder    kidney stone  . Sleep apnea     Surgical History: Past Surgical History:  Procedure Laterality Date  . CARDIAC CATHETERIZATION N/A 07/25/2015   Procedure: Right/Left Heart Cath and Coronary Angiography;  Surgeon: Wellington Hampshire, MD;  Location: [place] [facility];  Service: Cardiovascular;  Laterality: N/A;  . CARDIAC [place]HE[place]IZATION     [place]  . CARDIAC CATHETERIZATION     [facility], [place]    . CYSTOSCOPY WITH STENT PLACEMENT Left 09/07/2015   Procedure: CYSTOSCOPY WITH STENT PLACEMENT;  Surgeon: Hollice Espy, MD;  Location: [facility];  Service: Urology;  Laterality: Left;  . CYSTOSCOPY/URETEROSCOPY/HOLMIUM LASER/STENT PLACEMENT Left 10/25/2015   Procedure: CYSTOSCOPY/URETEROSCOPY/HOLMIUM LASER/STENT EXCHANGE;  Surgeon: Hollice Espy, MD;  Location: [facility];  Service: Urology;  Laterality: Left;  . EXTRACORPOREAL SHOCK WAVE LITHOTRIPSY Left 03/05/2019   Procedure: EXTRACORPOREAL SHOCK WAVE LITHOTRIPSY (ESWL);  Surgeon: Abbie Sons, MD;  Location: [facility];  Service: Urology;  Laterality: Left;  . EXTRACORPOREAL SHOCK WAVE LITHOTRIPSY Left 04/02/2019   Procedure: EXTRACORPOREAL SHOCK WAVE LITHOTRIPSY (ESWL);  Surgeon: Hollice Espy, MD;  Location: [facility];  Service: Urology;  Laterality: Left;  . KIDNEY SURGERY    . NEPHROSTOMY TUBE PLACEMENT ([place] HX) Left   . URETEROSCOPY WITH HOLMIUM LASER LITHOTRIPSY Left 09/07/2015   Procedure: URETEROSCOPY WITH HOLMIUM LASER LITHOTRIPSY;  Surgeon: Hollice Espy, MD;  Location: [facility];  Service: Urology;  Laterality: Left;    Home Medications:  Allergies as of 04/17/2019  Reactions   Other Hives   Blue cheese      Medication List       Accurate as of April 17, 2019  2:55 PM. If you have any questions, ask your nurse or doctor.        STOP taking these medications   HYDROcodone-acetaminophen 5-325 MG tablet Commonly known as: NORCO/VICODIN Stopped by: Hollice Espy, MD     TAKE these medications   amiodarone 200 MG tablet Commonly known as: PACERONE Take 1 tablet (200 mg total) by mouth daily. What  changed: when to take this   apixaban 5 MG Tabs tablet Commonly known as: Eliquis Take 1 tablet (5 mg total) 2 (two) times daily by mouth.   b complex vitamins tablet Take 1 tablet by mouth daily.   baclofen 10 MG tablet Commonly known as: LIORESAL Take 10 mg by mouth daily.   carvedilol 6.25 MG tablet Commonly known as: COREG TAKE 1 TABLET BY MOUTH TWICE DAILY   clonazePAM 1 MG tablet Commonly known as: KLONOPIN   CO Q 10 PO Take 400 mg by mouth daily.   Cranberry 500 MG Caps Take 1,000 mg by mouth daily.   docusate sodium 100 MG capsule Commonly known as: COLACE Take 1 capsule (100 mg total) by mouth 2 (two) times daily.   erythromycin ophthalmic ointment Place 1 application into the right eye at bedtime.   furosemide 80 MG tablet Commonly known as: LASIX Take 1 tablet (80 mg total) by mouth daily.   glipiZIDE 10 MG tablet Commonly known as: GLUCOTROL Take 10 mg by mouth 2 (two) times daily.   Hawthorne Berry 550 MG Caps Take 550 mg by mouth daily.   Jardiance 10 MG Tabs tablet Generic drug: empagliflozin 10 mg daily.   losartan 25 MG tablet Commonly known as: COZAAR Take 25 mg by mouth daily.   montelukast 10 MG tablet Commonly known as: SINGULAIR   Multi-Vitamins Tabs Take by mouth.   multivitamin-lutein Caps capsule Take 1 capsule by mouth 2 (two) times daily.   OVER THE COUNTER MEDICATION Take 3 capsules by mouth 2 (two) times daily. Reported on 12/23/2015   OVER THE COUNTER MEDICATION Take 1 capsule by mouth daily. Pt takes caprylic acid.   oxyCODONE-acetaminophen 5-325 MG tablet Commonly known as: Percocet Take 1-2 tablets by mouth every 4 (four) hours as needed for moderate pain or severe pain.   Ozempic (1 MG/DOSE) 2 MG/1.5ML Sopn Generic drug: Semaglutide (1 MG/DOSE)   polyethylene glycol 17 g packet Commonly known as: MIRALAX / GLYCOLAX Take 17 g by mouth daily as needed for mild constipation.   potassium phosphate (monobasic)  500 MG tablet Commonly known as: K-PHOS ORIGINAL Take 1 tablet (500 mg total) by mouth daily.   predniSONE 20 MG tablet Commonly known as: Deltasone Take 3 tablets (60 mg total) by mouth daily for 7 days.   rosuvastatin 20 MG tablet Commonly known as: CRESTOR   tamsulosin 0.4 MG Caps capsule Commonly known as: FLOMAX Take 1 capsule (0.4 mg total) by mouth daily.   Toujeo Max SoloStar 300 UNIT/ML Sopn Generic drug: Insulin Glargine (2 Unit Dial) Inject 20 Units into the skin daily. Titrate up to 60 units/day, per endocrinology instructions.   TURMERIC CURCUMIN PO Take 1 capsule by mouth daily.   vitamin C 500 MG tablet Commonly known as: ASCORBIC ACID Take 1,000 mg by mouth daily.   Vitamin D (Ergocalciferol) 1.25 MG (50000 UT) Caps capsule Commonly known as: DRISDOL   vitamin  E 400 UNIT capsule Take 400 Units by mouth daily.       Allergies:  Allergies  Allergen Reactions  . Other Hives    Blue cheese    Family History: Family History  Problem Relation Age of Onset  . Heart failure Father   . Heart attack Brother   . Kidney cancer Neg Hx   . Bladder Cancer Neg Hx   . Prostate cancer Neg Hx     Social History:  reports that he has never smoked. He has never used smokeless tobacco. He reports current alcohol use. He reports that he does not use drugs.  ROS: UROLOGY Frequent Urination?: No Hard to postpone urination?: Yes Burning/pain with urination?: No Get up at night to urinate?: Yes Leakage of urine?: No Urine stream starts and stops?: No Trouble starting stream?: No Do you have to strain to urinate?: No Blood in urine?: No Urinary tract infection?: No Sexually transmitted disease?: No Injury to kidneys or bladder?: No Painful intercourse?: No Weak stream?: No Erection problems?: No Penile pain?: No  Gastrointestinal Nausea?: No Vomiting?: No Indigestion/heartburn?: No Diarrhea?: No Constipation?: Yes  Constitutional Fever: No Night  sweats?: No Weight loss?: No Fatigue?: No  Skin Skin rash/lesions?: No Itching?: Yes  Eyes Blurred vision?: Yes Double vision?: No  Ears/Nose/Throat Sore throat?: No Sinus problems?: No  Hematologic/Lymphatic Swollen glands?: No Easy bruising?: Yes  Cardiovascular Leg swelling?: No Chest pain?: No  Respiratory Cough?: No Shortness of breath?: No  Endocrine Excessive thirst?: Yes  Musculoskeletal Back pain?: Yes Joint pain?: No  Neurological Headaches?: No Dizziness?: No  Psychologic Depression?: No Anxiety?: Yes  Physical Exam: BP 111/66   Pulse 84   Ht 6' (1.829 m)   Wt 275 lb (124.7 kg)   BMI 37.30 kg/m   Constitutional:  Alert and oriented, No acute distress. HEENT: [place] AT, moist mucus membranes.  Trachea midline, no masses. Cardiovascular: No clubbing, cyanosis, or edema. Respiratory: Normal respiratory effort, no increased work of breathing. Skin: No rashes, bruises or suspicious lesions. Neurologic: Grossly intact, no focal deficits, moving all 4 extremities. Psychiatric: Normal mood and affect.  Laboratory Data: Lab Results  Component Value Date   WBC 8.3 04/10/2019   HGB 14.1 04/10/2019   HCT 42.2 04/10/2019   MCV 97.2 04/10/2019   PLT 202 04/10/2019    Lab Results  Component Value Date   CREATININE 1.58 (H) 04/10/2019     Lab Results  Component Value Date   HGBA1C 11.0 (H) 07/08/2015    Pertinent Imaging: KUB today personally reviewed and compared to previous.  Large left kidney stone with Swiss cheeselike appearance, multiple fragments up to approximately 5 mm per my read.  There is still a large amount of stone material on the left but improved.  Assessment & Plan:    1. Left renal pelvic stone Fairly decent progress with ESWL x2 but significant residual stone burden on KUB today  I recommended that we give it a few more weeks to allow the interval stone to pass but return for more definitive ureteroscopy to clear to  residual stone burden on the left.  [name] undergone this procedure multiple times and is familiar.  Risk including risk of bleeding, infection, damage chronic structures, stent and stent pain were all discussed.  Staged procedures also discussed as an possibility.  For surgical purposes planning, we will have him get a CT scan a few days before the procedure when he is Covid tested and preop urine culture.  This will help [place] appreciate his overall stone burden as well as potentially cancel the procedure if he does pass significant amount of stone in the interim.  [name] agreeable this plan.  All questions answered. - CT RENAL STONE STUDY; Future  2. Left renal atrophy As per previous, significant left renal atrophy but worth preservation given his overall renal insufficiency - CT RENAL STONE STUDY; Future   Hollice Espy, MD  [facility] [address] [place], [place] [address] [phone]

## 2019-04-20 ENCOUNTER — Other Ambulatory Visit: Payer: Self-pay | Admitting: Radiology

## 2019-04-20 ENCOUNTER — Telehealth: Payer: Self-pay | Admitting: Radiology

## 2019-04-20 DIAGNOSIS — N201 Calculus of ureter: Secondary | ICD-10-CM

## 2019-04-20 NOTE — Telephone Encounter (Signed)
LMOM to return call.

## 2019-04-20 NOTE — Telephone Encounter (Signed)
-----   Message from Hollice Espy, MD sent at 04/17/2019  3:04 PM EST ----- Let us plan for left ureteroscopy with laser lithotripsy and stent placement on December 7.  I would like him to get a noncontrast CT scan specifically on the day that he gets Covid tested preoperatively for planning purposes.  Please see note for details.  He will have to hold his Eliquis per previous and he is already had cardiac clearance for his shockwaves so he should not have an issue proceeding with surgery.  I would plan for this being at least a 2 and half hour long surgery.  Ancef 2 g.

## 2019-04-24 ENCOUNTER — Encounter: Payer: Self-pay | Admitting: Urology

## 2019-04-28 ENCOUNTER — Other Ambulatory Visit
Admission: RE | Admit: 2019-04-28 | Discharge: 2019-04-28 | Disposition: A | Discharge status: Home / Self Care | Payer: Medicare Other | Attending: Urology | Admitting: Urology

## 2019-04-28 ENCOUNTER — Other Ambulatory Visit: Payer: Self-pay

## 2019-04-28 DIAGNOSIS — N201 Calculus of ureter: Secondary | ICD-10-CM | POA: Diagnosis present

## 2019-04-28 LAB — URINALYSIS, COMPLETE (UACMP) WITH MICROSCOPIC
Bilirubin Urine: NEGATIVE
Glucose, UA: 500 mg/dL — AB
Ketones, ur: NEGATIVE mg/dL
Nitrite: NEGATIVE
Protein, ur: NEGATIVE mg/dL
Specific Gravity, Urine: 1.015 (ref 1.005–1.030)
pH: 7 (ref 5.0–8.0)

## 2019-04-29 LAB — URINE CULTURE: Culture: <10000 — AB

## 2019-05-04 ENCOUNTER — Encounter
Admission: RE | Admit: 2019-05-04 | Discharge: 2019-05-04 | Disposition: A | Discharge status: Home / Self Care | Payer: Medicare Other | Source: Ambulatory Visit | Attending: Urology | Admitting: Urology

## 2019-05-04 NOTE — Patient Instructions (Addendum)
Your procedure is scheduled on: 05-11-19 MONDAY Report to Same Day Surgery 2nd floor medical mall [employer] Entrance-take elevator on left to 2nd floor.  Check in with surgery information desk.) To find out your arrival time please call [phone] between 1PM - 3PM on 05-08-19 FRIDAY  Remember: Instructions that are not followed completely may result in serious medical risk, up to and including death, or upon the discretion of your surgeon and anesthesiologist your surgery may need to be rescheduled.    _x___ 1. Do not eat food after midnight the night before your procedure. NO GUM OR CANDY AFTER MIDNIGHT. You may drink WATER up to 2 hours before you are scheduled to arrive at the hospital for your procedure.  Do not drink WATER within 2 hours of your scheduled arrival to the hospital.  Type 1 and type 2 diabetics should only drink water.   ____Ensure clear carbohydrate drink on the way to the hospital for bariatric patients  ____Ensure clear carbohydrate drink 3 hours before surgery.    __x__ 2. No Alcohol for 24 hours before or after surgery.   __x__3. No Smoking or e-cigarettes for 24 prior to surgery.  Do not use any chewable tobacco products for at least 6 hour prior to surgery   ____  4. Bring all medications with you on the day of surgery if instructed.    __x__ 5. Notify your doctor if there is any change in your medical condition     (cold, fever, infections).    x___6. On the morning of surgery brush your teeth with toothpaste and water.  You may rinse your mouth with mouth wash if you wish.  Do not swallow any toothpaste or mouthwash.   Do not wear jewelry, make-up, hairpins, clips or nail polish.  Do not wear lotions, powders, or perfumes. You may wear deodorant.  Do not shave 48 hours prior to surgery. Men may shave face and neck.  Do not bring valuables to the hospital.    [facility] is not responsible for any belongings or valuables.               Contacts,  dentures or bridgework may not be worn into surgery.  Leave your suitcase in the car. After surgery it may be brought to your room.  For patients admitted to the hospital, discharge time is determined by your  treatment team.  _  Patients discharged the day of surgery will not be allowed to drive home.  You will need someone to drive you home and stay with you the night of your procedure.    Please read over the following fact sheets that you were given:   [facility] Preparing for Surgery  _x___ TAKE THE FOLLOWING MEDICATION THE MORNING OF SURGERY WITH A SMALL SIP OF WATER. These include:  1. AMIODARONE (PACERONE)  2. CARVEDILOL (COREG)  3. [place] DM  4. MAGNESIUM OXIDE  5.  6.  ____Fleets enema or Magnesium Citrate as directed.   ____ Use CHG Soap or sage wipes as directed on instruction sheet   ____ Use inhalers on the day of surgery and bring to hospital day of surgery  ____ Stop Metformin and Janumet 2 days prior to surgery.    _X___ Take 1/2 of usual insulin dose the night before surgery and none on the morning surgery-DO NOT TAKE TOUJEO THE MORNING OF SURGERY   _x___ Follow recommendations from Cardiologist, Pulmonologist or PCP regarding stopping Aspirin, Coumadin, Plavix ,Eliquis, Effient, or  Pradaxa, and Pletal-PT STATES HE WAS INSTRUCTED TO STOP ELIQUIS ON Saturday PRIOR TO SURGERY  X____Stop Anti-inflammatories such as Advil, Aleve, Ibuprofen, Motrin, Naproxen, Naprosyn, Goodies powders or aspirin products. OK to take Tylenol OR PERCOCET IF NEEDED   _x___ Stop supplements until after surgery-STOP COQ-10, HAWTHORNE BERRY AND VITAMIN E NOW-MAY RESUME AFTER SURGERY   _X___ Bring C-Pap to the hospital.

## 2019-05-04 NOTE — Pre-Procedure Instructions (Signed)
EKG  ED ECG REPORT I, Blake Divine, the attending physician, personally viewed and interpreted this ECG.   Date: 04/10/2019  EKG Time: 13:42  Rate: 75  Rhythm: normal sinus rhythm  Axis: LAD  Intervals:nonspecific intraventricular conduction delay  ST&T Change: None   PROCEDURES  Procedure(s) performed (including Critical Care):  Procedures   ____________________________________________   INITIAL IMPRESSION / ASSESSMENT AND PLAN / ED COURSE      68 year old male presents to the ED with 4 days of right-sided facial weakness as well as numbness and tingling.  [name] outside of the window for TPA or endovascular intervention, no indication for code stroke.  Head CT was performed and negative for acute process.  On exam, his neurologic deficits appear most consistent with a Bell's palsy given involvement of his right upper face and no focal findings in his extremities.  However, he does complain of some numbness and has multiple risk factors for stroke.  Will perform MRI to rule out acute stroke.  EKG and labs are unremarkable.  MRI is negative for acute stroke confirming that patient's facial droop is secondary to Bell's palsy rather than central process.  Will prescribe course of steroids and counseled patient on close monitoring of his glucose levels during this.  We will also prescribe erythromycin ointment to be placed in his eye at night and counseled to use artificial tears during the day.  Counseled patient to follow-up with ophthalmology and his PCP, otherwise return to the ED for new or worsening symptoms.  Patient agrees with plan.  ____________________________________________   FINAL CLINICAL IMPRESSION(S) / ED DIAGNOSES  Final diagnoses:  Bell's palsy  Facial weakness        ED Discharge Orders               Ordered     predniSONE (DELTASONE) 20 MG tablet  Daily     04/10/19 2202     erythromycin ophthalmic ointment  Daily at  bedtime     04/10/19 2202            Note:  This document was prepared using Dragon voice recognition software and may include unintentional dictation errors.   Blake Divine, MD 04/10/19 2204         Electronically signed by Blake Divine, MD at 04/10/2019 10:04 PM   ED on 04/10/2019     Detailed Report     Note shared with patient

## 2019-05-06 ENCOUNTER — Encounter: Payer: Self-pay | Admitting: Urology

## 2019-05-07 ENCOUNTER — Other Ambulatory Visit: Payer: Self-pay

## 2019-05-07 ENCOUNTER — Ambulatory Visit
Admission: RE | Admit: 2019-05-07 | Discharge: 2019-05-07 | Disposition: A | Discharge status: Home / Self Care | Payer: Medicare Other | Source: Ambulatory Visit | Attending: Urology | Admitting: Urology

## 2019-05-07 ENCOUNTER — Other Ambulatory Visit
Admission: RE | Admit: 2019-05-07 | Discharge: 2019-05-07 | Disposition: A | Discharge status: Home / Self Care | Payer: Medicare Other | Source: Ambulatory Visit | Attending: Urology | Admitting: Urology

## 2019-05-07 DIAGNOSIS — N201 Calculus of ureter: Secondary | ICD-10-CM | POA: Insufficient documentation

## 2019-05-07 DIAGNOSIS — Z01818 Encounter for other preprocedural examination: Secondary | ICD-10-CM | POA: Diagnosis not present

## 2019-05-07 DIAGNOSIS — Z20828 Contact with and (suspected) exposure to other viral communicable diseases: Secondary | ICD-10-CM | POA: Diagnosis not present

## 2019-05-07 DIAGNOSIS — N261 Atrophy of kidney (terminal): Secondary | ICD-10-CM | POA: Diagnosis present

## 2019-05-07 IMAGING — CT CT RENAL STONE PROTOCOL
2 of 4 series · 17 of 46 positions shown, 19 images · non-contrast
Comparison: [DATE]

CLINICAL DATA: Left flank pain. Recent lithotripsy for left renal
calculi.

EXAM:
CT ABDOMEN AND PELVIS WITHOUT CONTRAST
TECHNIQUE: Multidetector CT imaging of the abdomen and pelvis was performed
following the standard protocol without IV contrast.

[Series 2: stone full standard · axial · 0.95mm/px · z∈[-512,-67]mm · 14 of 97 slices shown, 16 images]
[im 4/97  soft-tissue]
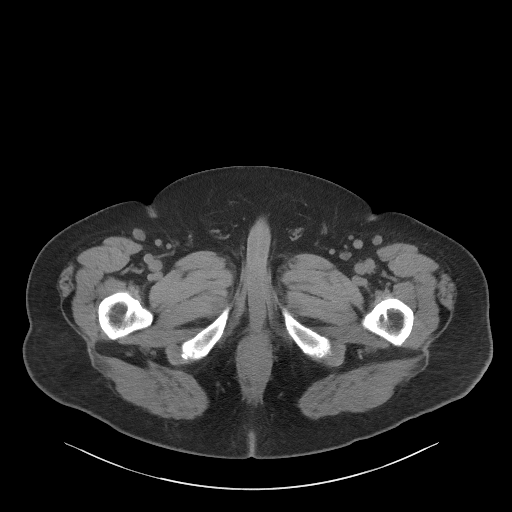
[im 4/97  bone]
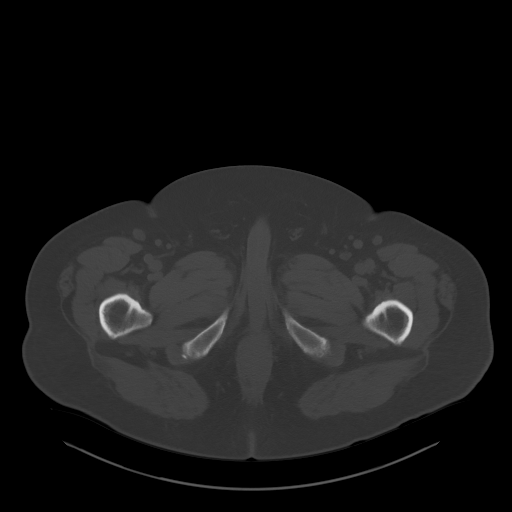
[im 12/97  soft-tissue]
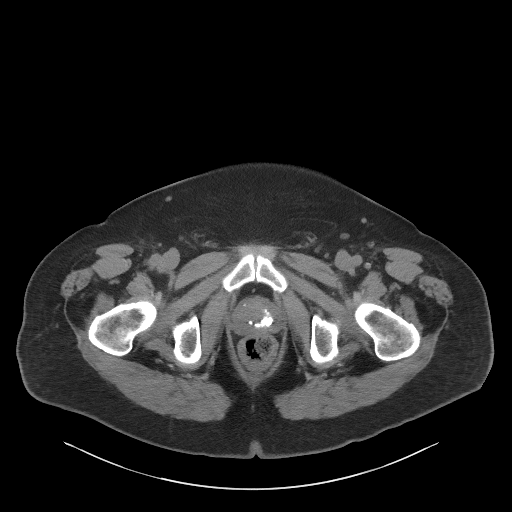
[im 20/97  soft-tissue]
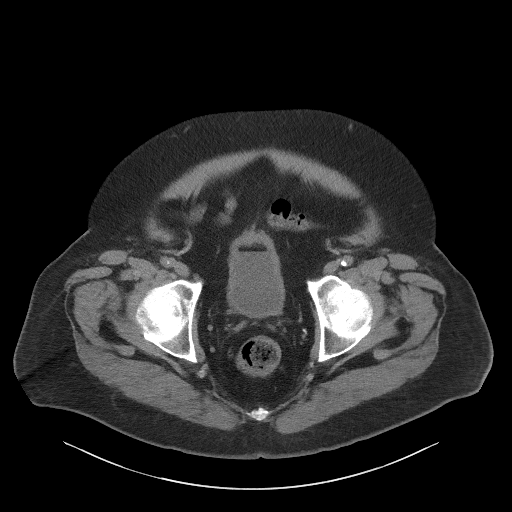
[im 27/97  soft-tissue]
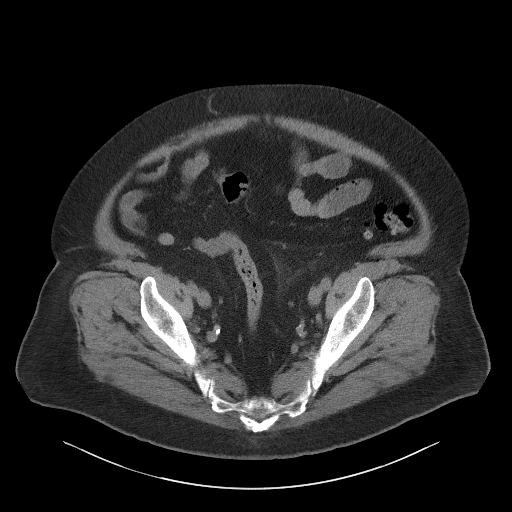
[im 31/97  soft-tissue]
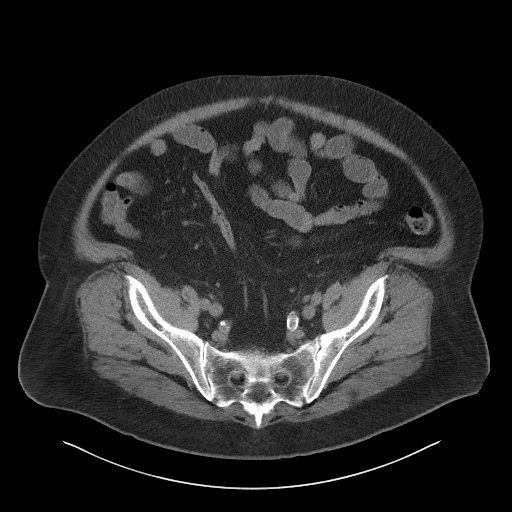
[im 39/97  soft-tissue]
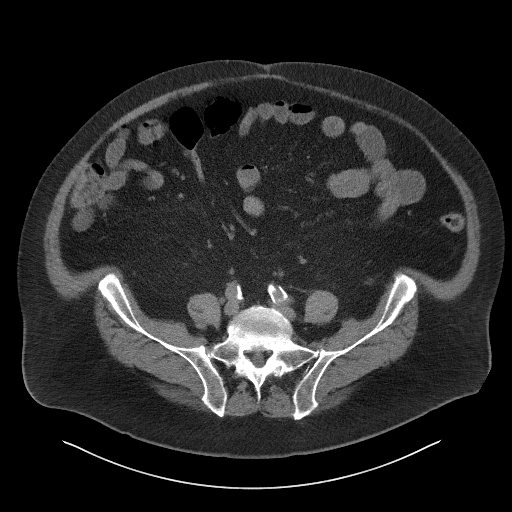
[im 47/97  soft-tissue]
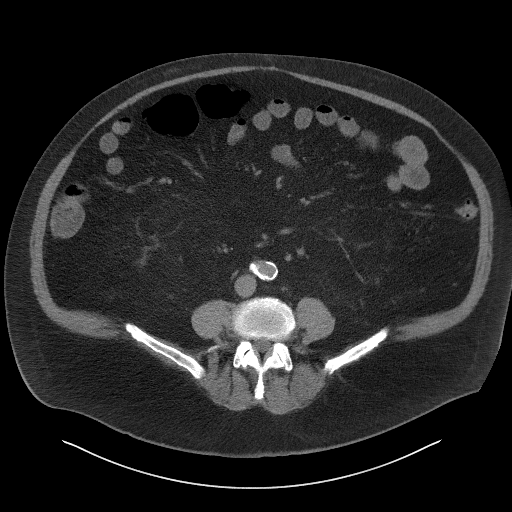
[im 50/97  soft-tissue]
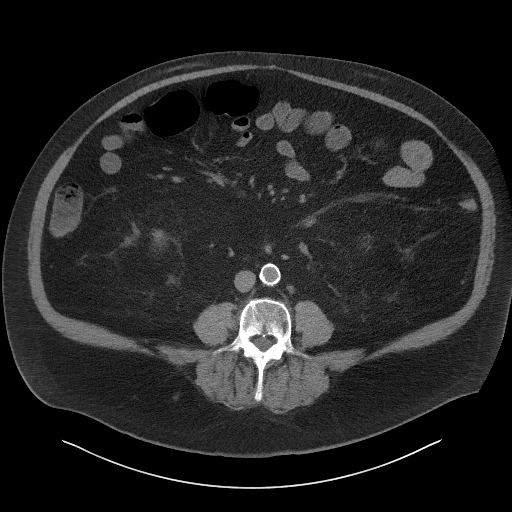
[im 58/97  soft-tissue]
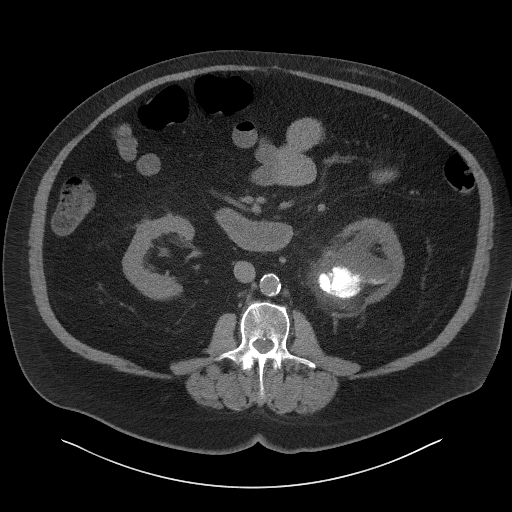
[im 58/97  bone]
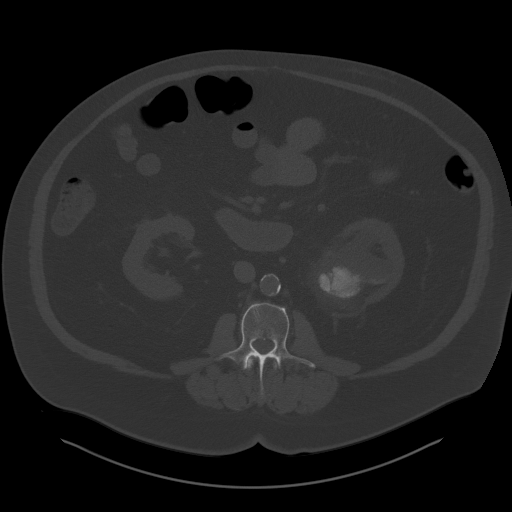
[im 66/97  soft-tissue]
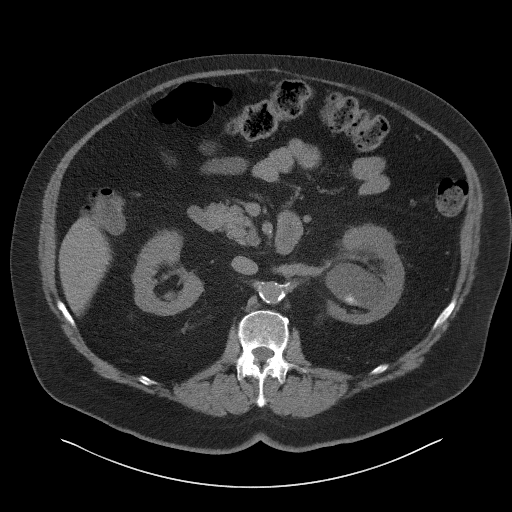
[im 73/97  soft-tissue]
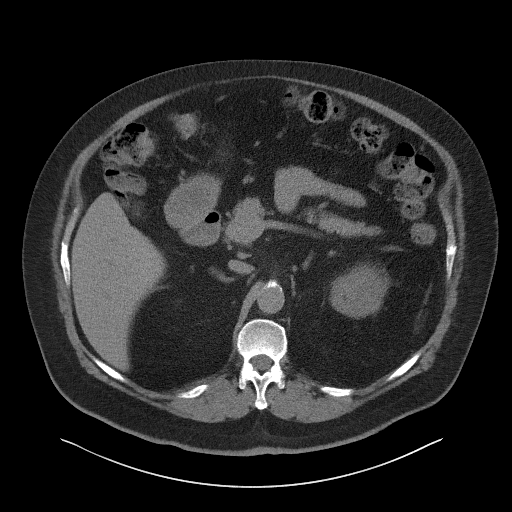
[im 77/97  soft-tissue]
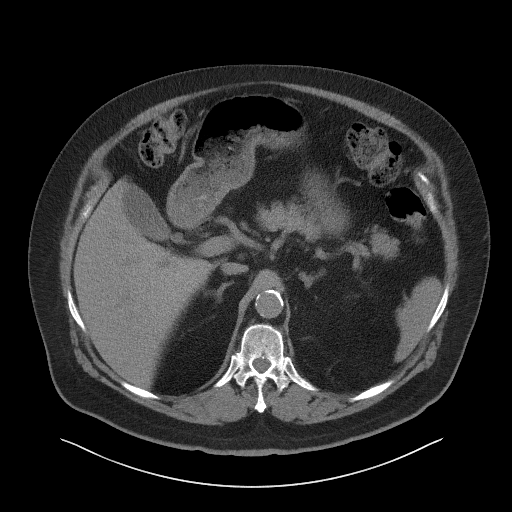
[im 85/97  soft-tissue]
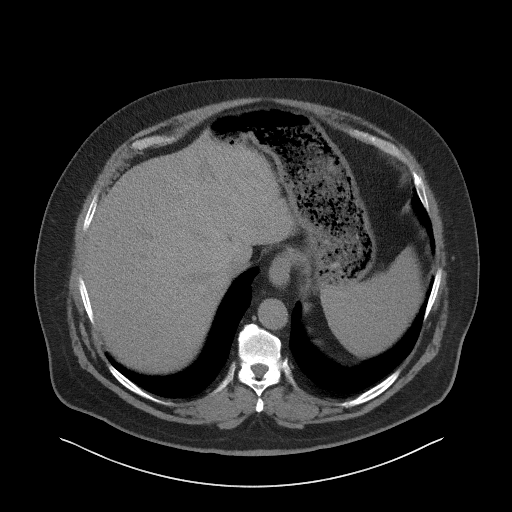
[im 93/97  soft-tissue]
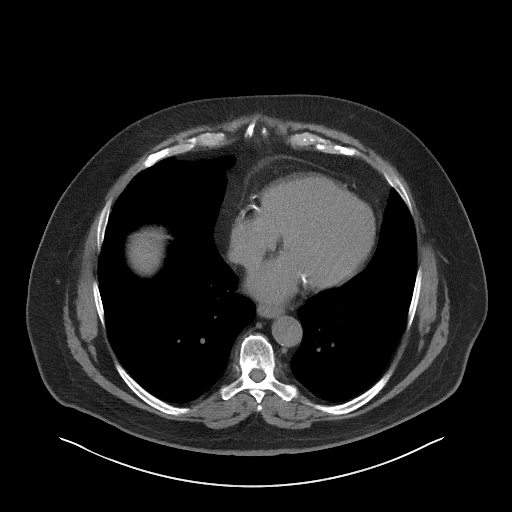

[Series 5: coronal · coronal · 0.95mm/px · 3 of 196 slices shown]
[im 66/196  soft-tissue]
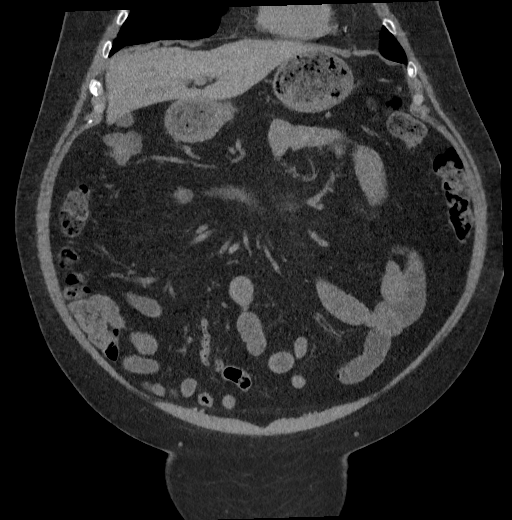
[im 87/196  soft-tissue]
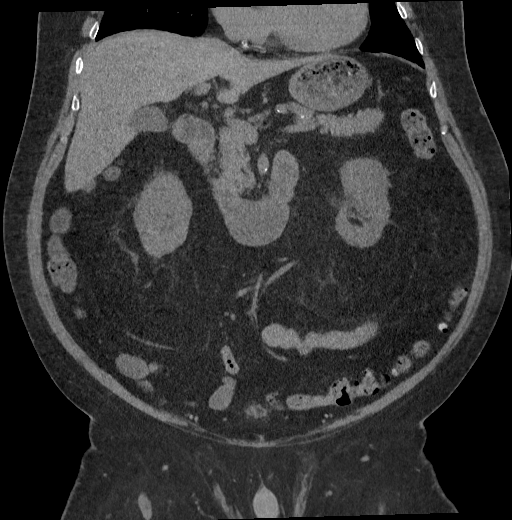
[im 109/196  soft-tissue]
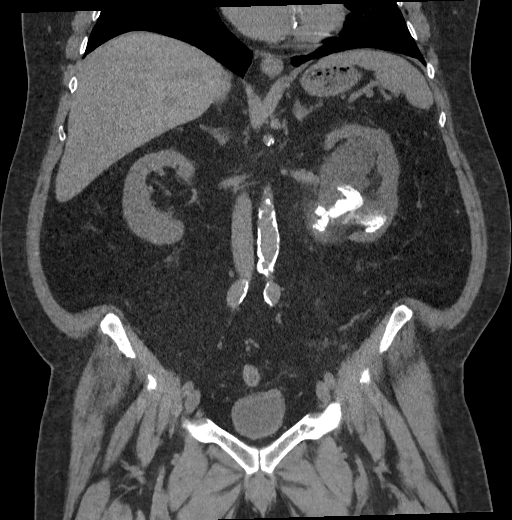

[17 of 46 positions shown; findings below may reference images not displayed]

FINDINGS: Lower chest: No acute findings.

Hepatobiliary: No mass visualized on this unenhanced exam.
Gallbladder is unremarkable. No evidence of biliary ductal
dilatation.

Pancreas: No mass or inflammatory process visualized on this
unenhanced exam.

Spleen:  Within normal limits in size.

Adrenals/Urinary tract: Previously seen large calculus at the left
UPJ has now been fragmented, and is located within the left renal
pelvis and collecting system. Severe left hydronephrosis and mild
perinephric stranding show no significant change. No ureteral
calculi identified.

Stomach/Bowel: No evidence of obstruction, inflammatory process, or
abnormal fluid collections. Normal appendix visualized.
Diverticulosis is seen mainly involving the descending and sigmoid
colon, however there is no evidence of diverticulitis.

Vascular/Lymphatic: No pathologically enlarged lymph nodes
identified. No evidence of abdominal aortic aneurysm. Aortic
atherosclerosis.

Reproductive:  No mass or other significant abnormality.

Other:  None.

Musculoskeletal:  No suspicious bone lesions identified.
IMPRESSION: 1. Numerous stone fragments within the left renal pelvis and
collecting system.
2. Stable severe left hydronephrosis and mild perinephric stranding.
3. Colonic diverticulosis, without radiographic evidence of
diverticulitis.

Aortic Atherosclerosis ([7V]-[7V]).

## 2019-05-08 LAB — SARS CORONAVIRUS 2 (TAT 6-24 HRS): SARS Coronavirus 2: NEGATIVE

## 2019-05-11 ENCOUNTER — Telehealth: Payer: Self-pay | Admitting: Urology

## 2019-05-11 ENCOUNTER — Ambulatory Visit: Payer: Medicare Other

## 2019-05-11 ENCOUNTER — Encounter: Payer: Self-pay | Admitting: Anesthesiology

## 2019-05-11 ENCOUNTER — Ambulatory Visit: Payer: Medicare Other | Admitting: Anesthesiology

## 2019-05-11 ENCOUNTER — Encounter
Admission: RE | Disposition: A | Discharge status: Home / Self Care | Payer: Self-pay | Source: Home / Self Care | Attending: Urology

## 2019-05-11 ENCOUNTER — Other Ambulatory Visit: Payer: Self-pay

## 2019-05-11 ENCOUNTER — Ambulatory Visit
Admission: RE | Admit: 2019-05-11 | Discharge: 2019-05-11 | Disposition: A | Discharge status: Home / Self Care | Payer: Medicare Other | Attending: Urology | Admitting: Urology

## 2019-05-11 DIAGNOSIS — I130 Hypertensive heart and chronic kidney disease with heart failure and stage 1 through stage 4 chronic kidney disease, or unspecified chronic kidney disease: Secondary | ICD-10-CM | POA: Diagnosis not present

## 2019-05-11 DIAGNOSIS — Z955 Presence of coronary angioplasty implant and graft: Secondary | ICD-10-CM | POA: Insufficient documentation

## 2019-05-11 DIAGNOSIS — I272 Pulmonary hypertension, unspecified: Secondary | ICD-10-CM | POA: Insufficient documentation

## 2019-05-11 DIAGNOSIS — I5042 Chronic combined systolic (congestive) and diastolic (congestive) heart failure: Secondary | ICD-10-CM | POA: Insufficient documentation

## 2019-05-11 DIAGNOSIS — Z87442 Personal history of urinary calculi: Secondary | ICD-10-CM | POA: Insufficient documentation

## 2019-05-11 DIAGNOSIS — I480 Paroxysmal atrial fibrillation: Secondary | ICD-10-CM | POA: Diagnosis not present

## 2019-05-11 DIAGNOSIS — I2510 Atherosclerotic heart disease of native coronary artery without angina pectoris: Secondary | ICD-10-CM | POA: Insufficient documentation

## 2019-05-11 DIAGNOSIS — E1122 Type 2 diabetes mellitus with diabetic chronic kidney disease: Secondary | ICD-10-CM | POA: Insufficient documentation

## 2019-05-11 DIAGNOSIS — Z79899 Other long term (current) drug therapy: Secondary | ICD-10-CM | POA: Diagnosis not present

## 2019-05-11 DIAGNOSIS — N183 Chronic kidney disease, stage 3 unspecified: Secondary | ICD-10-CM | POA: Diagnosis not present

## 2019-05-11 DIAGNOSIS — Z794 Long term (current) use of insulin: Secondary | ICD-10-CM | POA: Insufficient documentation

## 2019-05-11 DIAGNOSIS — Z7901 Long term (current) use of anticoagulants: Secondary | ICD-10-CM | POA: Diagnosis not present

## 2019-05-11 DIAGNOSIS — N261 Atrophy of kidney (terminal): Secondary | ICD-10-CM

## 2019-05-11 DIAGNOSIS — G4730 Sleep apnea, unspecified: Secondary | ICD-10-CM | POA: Diagnosis not present

## 2019-05-11 DIAGNOSIS — N201 Calculus of ureter: Secondary | ICD-10-CM

## 2019-05-11 DIAGNOSIS — N132 Hydronephrosis with renal and ureteral calculous obstruction: Secondary | ICD-10-CM | POA: Insufficient documentation

## 2019-05-11 DIAGNOSIS — I429 Cardiomyopathy, unspecified: Secondary | ICD-10-CM | POA: Diagnosis not present

## 2019-05-11 HISTORY — PX: CYSTOSCOPY/URETEROSCOPY/HOLMIUM LASER/STENT PLACEMENT: SHX6546

## 2019-05-11 LAB — GLUCOSE, CAPILLARY
Glucose-Capillary: 110 mg/dL — ABNORMAL HIGH (ref 70–99)
Glucose-Capillary: 94 mg/dL (ref 70–99)

## 2019-05-11 SURGERY — CYSTOSCOPY/URETEROSCOPY/HOLMIUM LASER/STENT PLACEMENT
Anesthesia: General | Site: Ureter | Laterality: Left

## 2019-05-11 MED ORDER — FENTANYL CITRATE (PF) 100 MCG/2ML IJ SOLN
INTRAMUSCULAR | Status: AC
  Filled 2019-05-11: qty 2

## 2019-05-11 MED ORDER — GLYCOPYRROLATE 0.2 MG/ML IJ SOLN
INTRAMUSCULAR | Status: AC
  Filled 2019-05-11: qty 1

## 2019-05-11 MED ORDER — LIDOCAINE HCL (PF) 2 % IJ SOLN
INTRAMUSCULAR | Status: AC
  Filled 2019-05-11: qty 10

## 2019-05-11 MED ORDER — ONDANSETRON HCL 4 MG/2ML IJ SOLN
INTRAMUSCULAR | Status: AC
  Filled 2019-05-11: qty 2

## 2019-05-11 MED ORDER — OXYCODONE HCL 5 MG/5ML PO SOLN: 5.0000 mg | Freq: Once | ORAL | Status: DC | PRN

## 2019-05-11 MED ORDER — SODIUM CHLORIDE 0.9 % IV SOLN
INTRAVENOUS | Status: DC
  Administered 2019-05-11: 10:00:00 via INTRAVENOUS

## 2019-05-11 MED ORDER — FENTANYL CITRATE (PF) 100 MCG/2ML IJ SOLN: 25.0000 ug | INTRAMUSCULAR | Status: DC | PRN

## 2019-05-11 MED ORDER — FAMOTIDINE 20 MG PO TABS
20.0000 mg | ORAL_TABLET | Freq: Once | ORAL | Status: AC
  Administered 2019-05-11: 10:00:00 20 mg via ORAL

## 2019-05-11 MED ORDER — IOPAMIDOL (ISOVUE-200) INJECTION 41%
INTRAVENOUS | Status: DC | PRN
  Administered 2019-05-11: 15 mL

## 2019-05-11 MED ORDER — LIDOCAINE HCL (CARDIAC) PF 100 MG/5ML IV SOSY
PREFILLED_SYRINGE | INTRAVENOUS | Status: DC | PRN
  Administered 2019-05-11: 50 mg via INTRAVENOUS

## 2019-05-11 MED ORDER — PHENYLEPHRINE HCL (PRESSORS) 10 MG/ML IV SOLN
INTRAVENOUS | Status: AC
  Filled 2019-05-11: qty 1

## 2019-05-11 MED ORDER — PROPOFOL 10 MG/ML IV BOLUS
INTRAVENOUS | Status: DC | PRN
  Administered 2019-05-11: 100 mg via INTRAVENOUS

## 2019-05-11 MED ORDER — DEXAMETHASONE SODIUM PHOSPHATE 10 MG/ML IJ SOLN
INTRAMUSCULAR | Status: DC | PRN
  Administered 2019-05-11: 5 mg via INTRAVENOUS

## 2019-05-11 MED ORDER — ROCURONIUM BROMIDE 50 MG/5ML IV SOLN
INTRAVENOUS | Status: AC
  Filled 2019-05-11: qty 1

## 2019-05-11 MED ORDER — MIDAZOLAM HCL 2 MG/2ML IJ SOLN
INTRAMUSCULAR | Status: DC | PRN
  Administered 2019-05-11: 2 mg via INTRAVENOUS

## 2019-05-11 MED ORDER — OXYCODONE HCL 5 MG PO TABS: 5.0000 mg | ORAL_TABLET | Freq: Once | ORAL | Status: DC | PRN

## 2019-05-11 MED ORDER — GLYCOPYRROLATE 0.2 MG/ML IJ SOLN
INTRAMUSCULAR | Status: DC | PRN
  Administered 2019-05-11: 0.2 mg via INTRAVENOUS

## 2019-05-11 MED ORDER — PROPOFOL 10 MG/ML IV BOLUS
INTRAVENOUS | Status: AC
  Filled 2019-05-11: qty 20

## 2019-05-11 MED ORDER — SUCCINYLCHOLINE CHLORIDE 20 MG/ML IJ SOLN
INTRAMUSCULAR | Status: DC | PRN
  Administered 2019-05-11: 100 mg via INTRAVENOUS

## 2019-05-11 MED ORDER — FENTANYL CITRATE (PF) 100 MCG/2ML IJ SOLN
INTRAMUSCULAR | Status: DC | PRN
  Administered 2019-05-11 (×2): 50 ug via INTRAVENOUS

## 2019-05-11 MED ORDER — DEXAMETHASONE SODIUM PHOSPHATE 10 MG/ML IJ SOLN
INTRAMUSCULAR | Status: AC
  Filled 2019-05-11: qty 1

## 2019-05-11 MED ORDER — ROCURONIUM BROMIDE 100 MG/10ML IV SOLN
INTRAVENOUS | Status: DC | PRN
  Administered 2019-05-11: 20 mg via INTRAVENOUS
  Administered 2019-05-11: 5 mg via INTRAVENOUS

## 2019-05-11 MED ORDER — EPHEDRINE SULFATE 50 MG/ML IJ SOLN
INTRAMUSCULAR | Status: AC
  Filled 2019-05-11: qty 1

## 2019-05-11 MED ORDER — PHENYLEPHRINE HCL (PRESSORS) 10 MG/ML IV SOLN
INTRAVENOUS | Status: DC | PRN
  Administered 2019-05-11 (×3): 100 ug via INTRAVENOUS

## 2019-05-11 MED ORDER — EPHEDRINE SULFATE 50 MG/ML IJ SOLN
INTRAMUSCULAR | Status: DC | PRN
  Administered 2019-05-11 (×2): 10 mg via INTRAVENOUS

## 2019-05-11 MED ORDER — CEFAZOLIN SODIUM-DEXTROSE 2-4 GM/100ML-% IV SOLN
2.0000 g | INTRAVENOUS | Status: AC
  Administered 2019-05-11: 2 g via INTRAVENOUS

## 2019-05-11 MED ORDER — MIDAZOLAM HCL 2 MG/2ML IJ SOLN
INTRAMUSCULAR | Status: AC
  Filled 2019-05-11: qty 2

## 2019-05-11 MED ORDER — SUCCINYLCHOLINE CHLORIDE 20 MG/ML IJ SOLN
INTRAMUSCULAR | Status: AC
  Filled 2019-05-11: qty 1

## 2019-05-11 MED ORDER — FAMOTIDINE 20 MG PO TABS
ORAL_TABLET | ORAL | Status: AC
  Administered 2019-05-11: 20 mg via ORAL
  Filled 2019-05-11: qty 1

## 2019-05-11 MED ORDER — ONDANSETRON HCL 4 MG/2ML IJ SOLN: 4.0000 mg | Freq: Once | INTRAMUSCULAR | Status: DC | PRN

## 2019-05-11 MED ORDER — ONDANSETRON HCL 4 MG/2ML IJ SOLN
INTRAMUSCULAR | Status: DC | PRN
  Administered 2019-05-11: 4 mg via INTRAVENOUS

## 2019-05-11 MED ORDER — CEFAZOLIN SODIUM-DEXTROSE 2-4 GM/100ML-% IV SOLN
INTRAVENOUS | Status: AC
  Filled 2019-05-11: qty 100

## 2019-05-11 SURGICAL SUPPLY — 29 items
BAG DRAIN CYSTO-URO LG1000N (MISCELLANEOUS) ×3 IMPLANT
BASKET ZERO TIP 1.9FR (BASKET) ×3 IMPLANT
BRUSH SCRUB EZ 1% IODOPHOR (MISCELLANEOUS) ×3 IMPLANT
CATH URETL 5X70 OPEN END (CATHETERS) ×3 IMPLANT
CNTNR SPEC 2.5X3XGRAD LEK (MISCELLANEOUS)
CONT SPEC 4OZ STER OR WHT (MISCELLANEOUS)
CONTAINER SPEC 2.5X3XGRAD LEK (MISCELLANEOUS) IMPLANT
DRAPE UTILITY 15X26 TOWEL STRL (DRAPES) ×3 IMPLANT
FIBER LASER TRAC TIP (UROLOGICAL SUPPLIES) IMPLANT
FIBER LASER TRACTIP 200 (UROLOGICAL SUPPLIES) ×3 IMPLANT
GLOVE BIO SURGEON STRL SZ 6.5 (GLOVE) ×2 IMPLANT
GLOVE BIO SURGEONS STRL SZ 6.5 (GLOVE) ×1
GOWN STRL REUS W/ TWL LRG LVL3 (GOWN DISPOSABLE) ×2 IMPLANT
GOWN STRL REUS W/TWL LRG LVL3 (GOWN DISPOSABLE) ×4
GUIDEWIRE GREEN .038 145CM (MISCELLANEOUS) ×3 IMPLANT
GUIDEWIRE STR DUAL SENSOR (WIRE) ×3 IMPLANT
INFUSOR MANOMETER BAG 3000ML (MISCELLANEOUS) ×3 IMPLANT
INTRODUCER DILATOR DOUBLE (INTRODUCER) ×3 IMPLANT
KIT TURNOVER CYSTO (KITS) ×3 IMPLANT
PACK CYSTO AR (MISCELLANEOUS) ×3 IMPLANT
SET CYSTO W/LG BORE CLAMP LF (SET/KITS/TRAYS/PACK) ×3 IMPLANT
SHEATH URETERAL 12FR 45CM (SHEATH) ×3 IMPLANT
SHEATH URETERAL 12FRX35CM (MISCELLANEOUS) IMPLANT
SOL .9 NS 3000ML IRR  AL (IV SOLUTION) ×2
SOL .9 NS 3000ML IRR UROMATIC (IV SOLUTION) ×1 IMPLANT
STENT URET 6FRX24 CONTOUR (STENTS) IMPLANT
STENT URET 6FRX26 CONTOUR (STENTS) ×3 IMPLANT
SURGILUBE 2OZ TUBE FLIPTOP (MISCELLANEOUS) ×3 IMPLANT
WATER STERILE IRR 1000ML POUR (IV SOLUTION) ×3 IMPLANT

## 2019-05-11 NOTE — Anesthesia Post-op Follow-up Note (Signed)
Anesthesia QCDR form completed.        

## 2019-05-11 NOTE — Anesthesia Postprocedure Evaluation (Signed)
Anesthesia Post Note  Patient: [name]  Procedure(s) Performed: CYSTOSCOPY/URETEROSCOPY/HOLMIUM LASER/STENT PLACEMENT (Left Ureter)  Patient location during evaluation: PACU Anesthesia Type: General Level of consciousness: awake and alert Pain management: pain level controlled Vital Signs Assessment: post-procedure vital signs reviewed and stable Respiratory status: spontaneous breathing, nonlabored ventilation, respiratory function stable and patient connected to nasal cannula oxygen Cardiovascular status: blood pressure returned to baseline and stable Postop Assessment: no apparent nausea or vomiting Anesthetic complications: no     Last Vitals:  Vitals:   05/11/19 1536 05/11/19 1550  BP: 121/71 131/77  Pulse: 68 69  Resp: (!) 21 18  Temp: (!) 35.8 C (!) 36.4 C  SpO2: 98% 98%    Last Pain:  Vitals:   05/11/19 1550  TempSrc: Oral  PainSc: 0-No pain                 Lakely Elmendorf S

## 2019-05-11 NOTE — Anesthesia Preprocedure Evaluation (Addendum)
Anesthesia Evaluation  Patient identified by MRN, date of birth, ID band Patient awake    Reviewed: Allergy & Precautions, H&P , NPO status , Patient's Chart, lab work & pertinent test results, reviewed documented beta blocker date and time   Airway Mallampati: III  TM Distance: >3 FB Neck ROM: full    Dental  (+) Poor Dentition   Pulmonary neg pulmonary ROS, sleep apnea ,    Pulmonary exam normal        Cardiovascular Exercise Tolerance: Poor hypertension, On Medications and Pt. on home beta blockers + CAD, + Cardiac Stents and +CHF  Normal cardiovascular exam+ dysrhythmias Atrial Fibrillation  Rhythm:regular Rate:Normal     Neuro/Psych negative neurological ROS  negative psych ROS   GI/Hepatic negative GI ROS, Neg liver ROS,   Endo/Other  negative endocrine ROSdiabetes, Well Controlled, Type 1, Insulin Dependent  Renal/GU Renal diseasenegative Renal ROS  negative genitourinary   Musculoskeletal   Abdominal   Peds  Hematology negative hematology ROS (+)   Anesthesia Other Findings Past Medical History: No date: Cardiomyopathy ([place]) No date: Chronic combined systolic (congestive) and diastolic  (congestive) heart failure (HCC)     Comment:  a. EF 25% by cath in 2013 b. Echo in 07/2015 showing EF               of 15-20%, moderate MR, moderate Pulm HTN, severely               dilated IVC No date: CKD (chronic kidney disease) stage 3, GFR 30-59 ml/min No date: Coronary artery disease No date: Diabetes mellitus type 2, uncontrolled ([place])  Latest echo in 2018 shows EF 55-60. EKG ok. Dec GFR. /MST.     Comment:  a. A1c 11.0 in 07/2015. No date: Hypertension No date: Kidney stones     Comment:  Left No date: New onset atrial fibrillation (HCC)     Comment:  a. diagnosed in 07/2015 b. started on Eliquis No date: Paroxysmal atrial fibrillation ([place]) 09-21-15: Pollen allergies     Comment:  pt called and stated that  he woke up and had some               drainage-pt states he did not see the color of the               drainage-pt denies running a fever and this only happened              once-pt instructed to call Dr Audree Bane office if he               starts running a fever or if the color of drainage               becomes yellow/green No date: Psoriasis No date: Pulmonary hypertension ([place]) No date: Renal disorder     Comment:  kidney stone No date: Sleep apnea Past Surgical History: 07/25/2015: CARDIAC CATHETERIZATION; N/A     Comment:  Procedure: Right/Left Heart Cath and Coronary               Angiography;  Surgeon: Wellington Hampshire, MD;  Location:               [facility];  Service: Cardiovascular;                Laterality: N/A; No date: CARDIAC CATHETERIZATION     Comment:  [place] No date: CARDIAC CATHETERIZATION     Comment:  [place],  [place] No date: CORONARY ANGIOPLASTY WITH STENT PLACEMENT 09/07/2015: [place]; Left     Comment:  Procedure: CYSTOSCOPY WITH STENT PLACEMENT;  Surgeon:               Hollice Espy, MD;  Location: [facility];  Service:               Urology;  Laterality: Left; 10/25/2015: CYSTOSCOPY/URETEROSCOPY/HOLMIUM LASER/STENT PLACEMENT; Left     Comment:  Procedure: CYSTOSCOPY/URETEROSCOPY/HOLMIUM LASER/STENT               EXCHANGE;  Surgeon: Hollice Espy, MD;  Location: [facility]               ORS;  Service: Urology;  Laterality: Left; 04/02/2019: EXTRACORPOREAL SHOCK WAVE LITHOTRIPSY; Left     Comment:  Procedure: EXTRACORPOREAL SHOCK WAVE LITHOTRIPSY (ESWL);              Surgeon: Hollice Espy, MD;  Location: [facility] OR[facility];                Service: Urology;  Laterality: Left; 03/05/2019: EXTRACORPOREAL SHOCK WAVE LITHOTRIPSY; Left     Comment:  Procedure: EXTRACORPOREAL SHOCK WAVE LITHOTRIPSY (ESWL);              Surgeon: Abbie Sons, MD;  Location: [facility] OR[facility];                Service: Urology;  Laterality: Left; No date: KIDNEY  SURGERY No date: NEPHROSTOMY TUBE PLACEMENT ([place] HX); Left 09/07/2015: URETEROSCOPY WITH HOLMIUM LASER LITHOTRIPSY; Left     Comment:  Procedure: URETEROSCOPY WITH HOLMIUM LASER LITHOTRIPSY;               Surgeon: Hollice Espy, MD;  Location: [facility] ORS;                Service: Urology;  Laterality: Left;   Reproductive/Obstetrics negative OB ROS                            Anesthesia Physical Anesthesia Plan  ASA: IV  Anesthesia Plan: General   Post-op Pain Management:    Induction: Intravenous  PONV Risk Score and Plan:   Airway Management Planned: Oral ETT  Additional Equipment:   Intra-op Plan:   Post-operative Plan:   Informed Consent: I have reviewed the patients History and Physical, chart, labs and discussed the procedure including the risks, benefits and alternatives for the proposed anesthesia with the patient or authorized representative who has indicated his/her understanding and acceptance.     Dental Advisory Given  Plan Discussed with: CRNA  Anesthesia Plan Comments:        Anesthesia Quick Evaluation

## 2019-05-11 NOTE — Interval H&P Note (Signed)
H&P reviewed.  No new updates.  CT scan in the interim shows fairly significant residual stone burden albeit fragmented with debris.  Regular rate and rhythm  Clear to auscultation bilateral  Preop urine culture negative

## 2019-05-11 NOTE — Telephone Encounter (Signed)
Manuela Schwartz from post op called to make follow up appt for pt, Cysto/stent removal she said on 05/21/2019 with Dr Erlene Quan. She is not here that day, when do we need to schedule? Please advise.

## 2019-05-11 NOTE — Anesthesia Procedure Notes (Signed)
Procedure Name: Intubation Performed by: Vida Nicol, CRNA Pre-anesthesia Checklist: Patient identified, Patient being monitored, Timeout performed, Emergency Drugs available and Suction available Patient Re-evaluated:Patient Re-evaluated prior to induction Oxygen Delivery Method: Circle system utilized Preoxygenation: Pre-oxygenation with 100% oxygen Induction Type: IV induction Ventilation: Mask ventilation without difficulty Laryngoscope Size: McGraph and 4 Grade View: Grade I Tube type: Oral Tube size: 7.5 mm Number of attempts: 1 Airway Equipment and Method: Stylet and Video-laryngoscopy Placement Confirmation: ETT inserted through vocal cords under direct vision,  positive ETCO2 and breath sounds checked- equal and bilateral Secured at: 22 cm Tube secured with: Tape Dental Injury: Teeth and Oropharynx as per pre-operative assessment        

## 2019-05-11 NOTE — Op Note (Signed)
Date of procedure: 05/11/19  Preoperative diagnosis:  1. Left atrophic kidney 2. Left hydroureteronephrosis 3. Left kidney stones  Postoperative diagnosis:  1. Same as above  Procedure: 1. Left ureteroscopy with laser lithotripsy 2. Left ureteral stent placement 3. Left retrograde pyelogram 4. Basket extraction of stone fragment  Surgeon: Hollice Espy, MD  Anesthesia: General  Complications: None  Intraoperative findings: Copious amounts of fragmented left renal pelvic stone, approximately 2 cm further fragmented and basketed.  EBL: Minimal  Specimens: None, stones previously sent  Drains: 6 x 26 French double-J ureteral stent on left  Indication: [name] is a 68 y.o. patient with large left UPJ stone status post ESWL x2 who returns today for staged ureteroscopy to treat his residual stone fragment.  After reviewing the management options for treatment, he elected to proceed with the above surgical procedure(s). We have discussed the potential benefits and risks of the procedure, side effects of the proposed treatment, the likelihood of the patient achieving the goals of the procedure, and any potential problems that might occur during the procedure or recuperation. Informed consent has been obtained.  Description of procedure:  The patient was taken to the operating room and general anesthesia was induced.  The patient was placed in the dorsal lithotomy position, prepped and draped in the usual sterile fashion, and preoperative antibiotics were administered. A preoperative time-out was performed.   47 French cystoscope was advanced per urethra into the bladder.  Attention was turned to the left ureteral orifice which was cannulated using a 5 [place] open-ended ureteral catheter.  A retrograde pyelogram was performed which revealed dilated renal pelvis with a large filling defect consistent with known stone.  The wire was then placed up to the level of the kidney  without difficulty.  A dual-lumen sheath was used to introduce a second Super Stiff wire up to the level of the kidney.  The sensor wire was left in place as a safety wire.  A Bard Optima ureteral access sheath was advanced over the Super Stiff wire to the level of the proximal ureter under fluoroscopic guidance.  The inner cannula and wire were removed excluding the sensor wire.  A 7 French dual-lumen flexible digital ureteroscope was advanced all the way to the level of the kidney.  Within the renal pelvis, copious amount of stone was identified.  A 200 m laser fiber was then brought in using dusting settings of 0.2 J and 40 Hz, the stone was dusted into even tinier particles.  Some of the stone appeared to be relatively normal without others appeared to be matrix-like and soft.  After approximately 30 minutes of lasering, I then started to basket the stone debris using a 1.9 [place] triple side no basket.  I did this over approximately 2-hour period of time.  Copious amount of stone burden was evacuated from the renal pelvis.  Ultimately, all that remained within the renal pelvis was very small stone debris.  This was approximately the tip of the laser fiber and size and did not appear to be clinically significant.  Each every calyx was directly visualized and no significant residual stone burden was appreciated.  Final retrograde pyelogram was performed and showed no extravasation outline the collecting system.  The scope was then backed out like the ureter.  A few small stones were basketed prior to removing the access sheath.  There is no ureteral injuries appreciated.  The safety wire was then backloaded over rigid cystoscope.  A 6 x  55 French double-J ureteral stent was advanced over the wire up to level renal pelvis.  The wire was partially drawn till full coils noted within the renal pelvis.  The wire was fully withdrawn and focalized over the bladder.  The bladder was drained.  The patient was clean dry,  repositioned supine position, reversed myesthesia, and taken to the PACU in stable condition.  There were no complications in this case.  Plan patient return next week for cystoscopy, stent removal.  Hollice Espy, M.D.

## 2019-05-11 NOTE — Transfer of Care (Signed)
Immediate Anesthesia Transfer of Care Note  Patient: Marques Ericson [facility]  Procedure(s) Performed: CYSTOSCOPY/URETEROSCOPY/HOLMIUM LASER/STENT PLACEMENT (Left Ureter)  Patient Location: PACU  Anesthesia Type:General  Level of Consciousness: sedated  Airway & Oxygen Therapy: Patient Spontanous Breathing and Patient connected to face mask oxygen  Post-op Assessment: Report given to RN and Post -op Vital signs reviewed and stable  Post vital signs: Reviewed  Last Vitals:  Vitals Value Taken Time  BP 145/84 05/11/19 1436  Temp    Pulse 76 05/11/19 1436  Resp 11 05/11/19 1436  SpO2 99 % 05/11/19 1436  Vitals shown include unvalidated device data.  Last Pain:  Vitals:   05/11/19 0934  TempSrc: Temporal  PainSc: 0-No pain         Complications: No apparent anesthesia complications

## 2019-05-11 NOTE — Discharge Instructions (Addendum)
You have a ureteral stent in place.  This is a tube that extends from your kidney to your bladder.  This may cause urinary bleeding, burning with urination, and urinary frequency.  Please call our office or present to the ED if you develop fevers >101 or pain which is not able to be controlled with oral pain medications.  You may be given either Flomax and/ or ditropan to help with bladder spasms and stent pain in addition to pain medications.   ° °[facility] °[address], [address] °[place], [place] [address] °[phone] ° ° ° °AMBULATORY SURGERY  °DISCHARGE INSTRUCTIONS ° ° °1) The drugs that you were given will stay in your system until tomorrow so for the next 24 hours you should not: ° °A) Drive an automobile °B) Make any legal decisions °C) Drink any alcoholic beverage ° ° °2) You may resume regular meals tomorrow.  Today it is better to start with liquids and gradually work up to solid foods. ° °You may eat anything you prefer, but it is better to start with liquids, then soup and crackers, and gradually work up to solid foods. ° ° °3) Please notify your doctor immediately if you have any unusual bleeding, trouble breathing, redness and pain at the surgery site, drainage, fever, or pain not relieved by medication. ° ° ° °4) Additional Instructions: ° ° ° ° ° ° ° °Please contact your physician with any problems or Same Day Surgery at [phone], Monday through Friday 6 am to 4 pm, or [place] [facility] number at [phone]. °

## 2019-05-12 ENCOUNTER — Other Ambulatory Visit: Payer: Self-pay

## 2019-05-12 ENCOUNTER — Encounter: Payer: Self-pay | Admitting: Urology

## 2019-05-12 MED ORDER — OXYCODONE-ACETAMINOPHEN 5-325 MG PO TABS: 1.0000 | ORAL_TABLET | ORAL | 0 refills | Status: DC | PRN

## 2019-05-12 MED ORDER — OXYBUTYNIN CHLORIDE 5 MG PO TABS: 5.0000 mg | ORAL_TABLET | Freq: Three times a day (TID) | ORAL | 0 refills | Status: DC

## 2019-05-12 MED ORDER — TAMSULOSIN HCL 0.4 MG PO CAPS: 0.4000 mg | ORAL_CAPSULE | Freq: Every day | ORAL | 0 refills | Status: DC

## 2019-05-12 NOTE — Telephone Encounter (Signed)
Patient called stating that he is having more pain post op than normal. The stent is causing severe flank pain and he is not able to get any rest. He would like a refill on his pain medication.  Patient was notified that this pain may be ureteral/bladder spasms and he may benefit from Flomax and oxybutinin. Patient states he is not sure about taking Flomax per last conversation with him and it possibly effecting his kidneys.

## 2019-05-15 ENCOUNTER — Encounter: Payer: Self-pay | Admitting: Urology

## 2019-05-15 NOTE — Telephone Encounter (Signed)
Spoke with patient he states he has been seeing large amounnts blood in his urine since post procedure. Denies large clots, still able to void without pain or difficulty. He started taking Eliquis post surgery on 05/11/19, he would like to know if he should continue taking to see if helps with the decrease the amount of blood in his urine and if he needs to hold it before his stent removal on 05/21/19?

## 2019-05-21 ENCOUNTER — Other Ambulatory Visit: Payer: Self-pay

## 2019-05-21 ENCOUNTER — Ambulatory Visit (INDEPENDENT_AMBULATORY_CARE_PROVIDER_SITE_OTHER): Payer: Medicare Other | Admitting: Urology

## 2019-05-21 ENCOUNTER — Encounter: Payer: Self-pay | Admitting: Urology

## 2019-05-21 VITALS — BP 134/80 | HR 93 | Ht 68.0 in | Wt 277.0 lb

## 2019-05-21 DIAGNOSIS — N201 Calculus of ureter: Secondary | ICD-10-CM

## 2019-05-21 DIAGNOSIS — N1330 Unspecified hydronephrosis: Secondary | ICD-10-CM

## 2019-05-21 MED ORDER — CIPROFLOXACIN HCL 500 MG PO TABS
500.0000 mg | ORAL_TABLET | Freq: Once | ORAL | Status: AC
  Administered 2019-05-21: 500 mg via ORAL

## 2019-05-21 NOTE — Progress Notes (Signed)
   05/21/19  CC:  Chief Complaint  Patient presents with  . Cysto Stent Removal    HPI: 68 year old male with an atrophic left kidney with large volume renal pelvic nephrolithiasis status post ESWL x2 followed by staged ureteroscopy.  He returns today for stent removal.  He has been tolerating the stent moderately well but is anxious to have it removed today.  Urinary symptoms consistent with stent irritation.  No fevers or chills.  Blood pressure 134/80, pulse 93, height 5\' 8"  (1.727 m), weight 277 lb (125.6 kg). NED. A&Ox3.   No respiratory distress   Abd soft, NT, ND Normal phallus with bilateral descended testicles  Cystoscopy/ Stent removal procedure  Patient identification was confirmed, informed consent was obtained, and patient was prepped using Betadine solution.  Lidocaine jelly was administered per urethral meatus.    Preoperative abx where received prior to procedure.    Procedure: - Flexible cystoscope introduced, without any difficulty.   - Thorough search of the bladder revealed:    normal urethral meatus  Stent seen emanating from left ureteral orifice, grasped with stent graspers, and removed in entirety.    Moderate debris in bladder limiting visualization.  Post-Procedure: - Patient tolerated the procedure well  Assessment/ Plan:  1. Left ureteral stone Status post uncomplicated stent removal today  Warning symptoms reviewed  Plan for follow-up in 6 weeks with KUB - Urinalysis, Complete - ciprofloxacin (CIPRO) tablet 500 mg  2. 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

## 2019-05-22 LAB — URINALYSIS, COMPLETE
Bilirubin, UA: NEGATIVE
Nitrite, UA: NEGATIVE
Specific Gravity, UA: 1.02 (ref 1.005–1.030)
Urobilinogen, Ur: 0.2 mg/dL (ref 0.2–1.0)
pH, UA: 7 (ref 5.0–7.5)

## 2019-05-22 LAB — MICROSCOPIC EXAMINATION
RBC, Urine: >30 /hpf — AB (ref 0–2)
WBC, UA: >30 /hpf — AB (ref 0–5)

## 2019-05-26 ENCOUNTER — Other Ambulatory Visit: Payer: Self-pay | Admitting: Urology

## 2019-06-12 ENCOUNTER — Encounter: Payer: Self-pay | Admitting: Family

## 2019-06-12 ENCOUNTER — Telehealth (INDEPENDENT_AMBULATORY_CARE_PROVIDER_SITE_OTHER): Payer: Medicare Other | Admitting: Family

## 2019-06-12 ENCOUNTER — Other Ambulatory Visit: Payer: Self-pay

## 2019-06-12 VITALS — BP 147/64 | HR 66 | Ht 72.0 in | Wt 275.0 lb

## 2019-06-12 DIAGNOSIS — E785 Hyperlipidemia, unspecified: Secondary | ICD-10-CM

## 2019-06-12 DIAGNOSIS — N183 Chronic kidney disease, stage 3 unspecified: Secondary | ICD-10-CM

## 2019-06-12 DIAGNOSIS — I5032 Chronic diastolic (congestive) heart failure: Secondary | ICD-10-CM | POA: Diagnosis not present

## 2019-06-12 DIAGNOSIS — I110 Hypertensive heart disease with heart failure: Secondary | ICD-10-CM

## 2019-06-12 DIAGNOSIS — I480 Paroxysmal atrial fibrillation: Secondary | ICD-10-CM

## 2019-06-12 DIAGNOSIS — G4733 Obstructive sleep apnea (adult) (pediatric): Secondary | ICD-10-CM

## 2019-06-12 DIAGNOSIS — I10 Essential (primary) hypertension: Secondary | ICD-10-CM

## 2019-06-12 NOTE — Patient Instructions (Signed)
Medication Instructions:  No medication changes today.  *If you need a refill on your cardiac medications before your next appointment, please call your pharmacy*  Lab Work: None ordered today.  I will look into your labs with Dr. Arliss Journey office to ensure a TSH (thyroid hormone) has been drawn within the last year. This is part of the monitoring for any patient on amiodarone.  Testing/Procedures: None ordered today.  Follow-Up: At [facility], you and your health needs are our priority.  As part of our continuing mission to provide you with exceptional heart care, we have created designated Provider Care Teams.  These Care Teams include your primary Cardiologist (physician) and Advanced Practice Providers (APPs -  Physician Assistants and Nurse Practitioners) who all work together to provide you with the care you need, when you need it.  Your next appointment:   6 month(s)  The format for your next appointment:   In Person  Provider:   Nelva Bush, MD  Other Instructions  Recommend low sodium, heart healthy diet.   Recommend regular cardiovascular exercise. Try starting small and doing things like a lap around the house or gradually working up to walking your long driveway.   DASH Eating Plan DASH stands for "Dietary Approaches to Stop Hypertension." The DASH eating plan is a healthy eating plan that has been shown to reduce high blood pressure (hypertension). It may also reduce your risk for type 2 diabetes, heart disease, and stroke. The DASH eating plan may also help with weight loss. What are tips for following this plan?  General guidelines  Avoid eating more than 2,300 mg (milligrams) of salt (sodium) a day. If you have hypertension, you may need to reduce your sodium intake to 1,500 mg a day.  Limit alcohol intake to no more than 1 drink a day for nonpregnant women and 2 drinks a day for men. One drink equals 12 oz of beer, 5 oz of wine, or 1 oz of hard  liquor.  Work with your health care provider to maintain a healthy body weight or to lose weight. Ask what an ideal weight is for you.  Get at least 30 minutes of exercise that causes your heart to beat faster (aerobic exercise) most days of the week. Activities may include walking, swimming, or biking.  Work with your health care provider or diet and nutrition specialist (dietitian) to adjust your eating plan to your individual calorie needs. Reading food labels   Check food labels for the amount of sodium per serving. Choose foods with less than 5 percent of the Daily Value of sodium. Generally, foods with less than 300 mg of sodium per serving fit into this eating plan.  To find whole grains, look for the word "whole" as the first word in the ingredient list. Shopping  Buy products labeled as "low-sodium" or "no salt added."  Buy fresh foods. Avoid canned foods and premade or frozen meals. Cooking  Avoid adding salt when cooking. Use salt-free seasonings or herbs instead of table salt or sea salt. Check with your health care provider or pharmacist before using salt substitutes.  Do not fry foods. Cook foods using healthy methods such as baking, boiling, grilling, and broiling instead.  Cook with heart-healthy oils, such as olive, canola, soybean, or sunflower oil. Meal planning  Eat a balanced diet that includes: ? 5 or more servings of fruits and vegetables each day. At each meal, try to fill half of your plate with fruits and vegetables. ?  Up to 6-8 servings of whole grains each day. ? Less than 6 oz of lean meat, poultry, or fish each day. A 3-oz serving of meat is about the same size as a deck of cards. One egg equals 1 oz. ? 2 servings of low-fat dairy each day. ? A serving of nuts, seeds, or beans 5 times each week. ? Heart-healthy fats. Healthy fats called Omega-3 fatty acids are found in foods such as flaxseeds and coldwater fish, like sardines, salmon, and  mackerel.  Limit how much you eat of the following: ? Canned or prepackaged foods. ? Food that is high in trans fat, such as fried foods. ? Food that is high in saturated fat, such as fatty meat. ? Sweets, desserts, sugary drinks, and other foods with added sugar. ? Full-fat dairy products.  Do not salt foods before eating.  Try to eat at least 2 vegetarian meals each week.  Eat more home-cooked food and less restaurant, buffet, and fast food.  When eating at a restaurant, ask that your food be prepared with less salt or no salt, if possible. What foods are recommended? The items listed may not be a complete list. Talk with your dietitian about what dietary choices are best for you. Grains Whole-grain or whole-wheat bread. Whole-grain or whole-wheat pasta. Brown rice. [place]ena Morrow. Bulgur. Whole-grain and low-sodium cereals. Pita bread. Low-fat, low-sodium crackers. Whole-wheat flour tortillas. Vegetables Fresh or frozen vegetables (raw, steamed, roasted, or grilled). Low-sodium or reduced-sodium tomato and vegetable juice. Low-sodium or reduced-sodium tomato sauce and tomato paste. Low-sodium or reduced-sodium canned vegetables. Fruits All fresh, dried, or frozen fruit. Canned fruit in natural juice (without added sugar). Meat and other protein foods Skinless chicken or [place]. Ground chicken or [place]. Pork with fat trimmed off. Fish and seafood. Egg whites. Dried beans, peas, or lentils. Unsalted nuts, nut butters, and seeds. Unsalted canned beans. Lean cuts of beef with fat trimmed off. Low-sodium, lean deli meat. Dairy Low-fat (1%) or fat-free (skim) milk. Fat-free, low-fat, or reduced-fat cheeses. Nonfat, low-sodium ricotta or cottage cheese. Low-fat or nonfat yogurt. Low-fat, low-sodium cheese. Fats and oils Soft margarine without trans fats. Vegetable oil. Low-fat, reduced-fat, or light mayonnaise and salad dressings (reduced-sodium). Canola, safflower, olive, soybean, and  sunflower oils. Avocado. Seasoning and other foods Herbs. Spices. Seasoning mixes without salt. Unsalted popcorn and pretzels. Fat-free sweets. What foods are not recommended? The items listed may not be a complete list. Talk with your dietitian about what dietary choices are best for you. Grains Baked goods made with fat, such as croissants, muffins, or some breads. Dry pasta or rice meal packs. Vegetables Creamed or fried vegetables. Vegetables in a cheese sauce. Regular canned vegetables (not low-sodium or reduced-sodium). Regular canned tomato sauce and paste (not low-sodium or reduced-sodium). Regular tomato and vegetable juice (not low-sodium or reduced-sodium). Angie Fava. Olives. Fruits Canned fruit in a light or heavy syrup. Fried fruit. Fruit in cream or butter sauce. Meat and other protein foods Fatty cuts of meat. Ribs. Fried meat. Berniece Salines. Sausage. Bologna and other processed lunch meats. Salami. Fatback. Hotdogs. Bratwurst. Salted nuts and seeds. Canned beans with added salt. Canned or smoked fish. Whole eggs or egg yolks. Chicken or [place] with skin. Dairy Whole or 2% milk, cream, and half-and-half. Whole or full-fat cream cheese. Whole-fat or sweetened yogurt. Full-fat cheese. Nondairy creamers. Whipped toppings. Processed cheese and cheese spreads. Fats and oils Butter. Stick margarine. Lard. Shortening. Ghee. Bacon fat. Tropical oils, such as coconut, palm kernel, or palm oil.  Seasoning and other foods Salted popcorn and pretzels. Onion salt, garlic salt, seasoned salt, table salt, and sea salt. Worcestershire sauce. Tartar sauce. Barbecue sauce. Teriyaki sauce. Soy sauce, including reduced-sodium. Steak sauce. Canned and packaged gravies. Fish sauce. Oyster sauce. Cocktail sauce. Horseradish that you find on the shelf. Ketchup. Mustard. Meat flavorings and tenderizers. Bouillon cubes. Hot sauce and Tabasco sauce. Premade or packaged marinades. Premade or packaged taco seasonings.  Relishes. Regular salad dressings. Where to find more information:  National Heart, Lung, and [place]: https://wilson-eaton.com/  American Heart Association: www.heart.org Summary  The DASH eating plan is a healthy eating plan that has been shown to reduce high blood pressure (hypertension). It may also reduce your risk for type 2 diabetes, heart disease, and stroke.  With the DASH eating plan, you should limit salt (sodium) intake to 2,300 mg a day. If you have hypertension, you may need to reduce your sodium intake to 1,500 mg a day.  When on the DASH eating plan, aim to eat more fresh fruits and vegetables, whole grains, lean proteins, low-fat dairy, and heart-healthy fats.  Work with your health care provider or diet and nutrition specialist (dietitian) to adjust your eating plan to your individual calorie needs. This information is not intended to replace advice given to you by your health care provider. Make sure you discuss any questions you have with your health care provider. Document Revised: 05/03/2017 Document Reviewed: 05/14/2016 Elsevier Patient Education  2020 [employer].

## 2019-06-12 NOTE — Progress Notes (Signed)
Virtual Visit via Telephone Note   This visit type was conducted due to national recommendations for restrictions regarding the COVID-19 Pandemic (e.g. social distancing) in an effort to limit this patient's exposure and mitigate transmission in our community.  Due to his co-morbid illnesses, this patient is at least at moderate risk for complications without adequate follow up.  This format is felt to be most appropriate for this patient at this time.  The patient did not have access to video technology/had technical difficulties with video requiring transitioning to audio format only (telephone).  All issues noted in this document were discussed and addressed.  No physical exam could be performed with this format.  Please refer to the patient's chart for his  consent to telehealth for [facility].   Date:  06/12/2019   ID:  Love, Jim 21-Oct-1950, MRN [id number]  Patient Location: Home Provider Location: Home  PCP:  Vidal Schwalbe, MD  Cardiologist:  Nelva Bush, MD  Electrophysiologist:  None   Evaluation Performed:  Follow-Up Visit  Chief Complaint:  6 month f/u of HF, CAD, HTN  History of Present Illness:    [name] is a 69 y.o. male with history of chronic systolic and diastolic heart failure earlier (LVEF 55 to 60% in 11/2016), CAD s/p PCI to proximal LAD, PAF, CKD 3, HTN, DM 2.  Last seen by Dr. Saunders Revel 11/2018. Follows with Dr. Bartolo Darter (PCP) and Dr. Juleen China (nephrology).    04/10/19 K 4.0, creatinine 1.58, GFR 45, ,AST 21, ALT 26, Hb 14.  Last TSH 12/12/17 1.83  Very pleasant gentleman who reports feeling well.  He denies chest pain, pressure, tightness.  Reports no shortness of breath at rest, tells me his DOE is stable but endorses that he is not very active.  Encourage increased physical activity.  He reports no lower extremity edema, orthopnea, PND.  He reports no palpitations.  He reports no melena.  [name] recently been going treatment with urology for  multiple kidney stones that he has had some blood in his urine but tells me nothing that his urologist is concerned about.  Tells me it does not work.  Blood but occasional discolored urine.  The patient does not have symptoms concerning for COVID-19 infection (fever, chills, cough, or new shortness of breath).    Past Medical History:  Diagnosis Date  . Cardiomyopathy ([place])   . Chronic combined systolic (congestive) and diastolic (congestive) heart failure (HCC)    a. EF 25% by cath in 2013 b. Echo in 07/2015 showing EF of 15-20%, moderate MR, moderate Pulm HTN, severely dilated IVC  . CKD (chronic kidney disease) stage 3, GFR 30-59 ml/min   . Coronary artery disease   . Diabetes mellitus type 2, uncontrolled ([place])    a. A1c 11.0 in 07/2015.  [name] Hypertension   . Kidney stones    Left  . New onset atrial fibrillation ([place])    a. diagnosed in 07/2015 b. started on Eliquis  . Paroxysmal atrial fibrillation (HCC)   . Pollen allergies 09-21-15   pt called and stated that he woke up and had some drainage-pt states he did not see the color of the drainage-pt denies running a fever and this only happened once-pt instructed to call Dr Audree Bane office if he starts running a fever or if the color of drainage becomes yellow/green  . Psoriasis   . Pulmonary hypertension ([place])   . Renal disorder    kidney stone  . Sleep  apnea    Past Surgical History:  Procedure Laterality Date  . CARDIAC CATHETERIZATION N/A 07/25/2015   Procedure: Right/Left Heart Cath and Coronary Angiography;  Surgeon: Wellington Hampshire, MD;  Location: [facility];  Service: Cardiovascular;  Laterality: N/A;  . CARDIAC [place]     [place]  . CARDIAC CATHETERIZATION     [facility], [place]    . CYSTOSCOPY WITH STENT PLACEMENT Left 09/07/2015   Procedure: CYSTOSCOPY WITH STENT PLACEMENT;  Surgeon: Hollice Espy, MD;  Location: [facility];  Service: Urology;   Laterality: Left;  . CYSTOSCOPY/URETEROSCOPY/HOLMIUM LASER/STENT PLACEMENT Left 10/25/2015   Procedure: CYSTOSCOPY/URETEROSCOPY/HOLMIUM LASER/STENT EXCHANGE;  Surgeon: Hollice Espy, MD;  Location: [facility];  Service: Urology;  Laterality: Left;  . CYSTOSCOPY/URETEROSCOPY/HOLMIUM LASER/STENT PLACEMENT Left 05/11/2019   Procedure: CYSTOSCOPY/URETEROSCOPY/HOLMIUM LASER/STENT PLACEMENT;  Surgeon: Hollice Espy, MD;  Location: [facility];  Service: Urology;  Laterality: Left;  . EXTRACORPOREAL SHOCK WAVE LITHOTRIPSY Left 04/02/2019   Procedure: EXTRACORPOREAL SHOCK WAVE LITHOTRIPSY (ESWL);  Surgeon: Hollice Espy, MD;  Location: [facility];  Service: Urology;  Laterality: Left;  . EXTRACORPOREAL SHOCK WAVE LITHOTRIPSY Left 03/05/2019   Procedure: EXTRACORPOREAL SHOCK WAVE LITHOTRIPSY (ESWL);  Surgeon: Abbie Sons, MD;  Location: [facility];  Service: Urology;  Laterality: Left;  . KIDNEY SURGERY    . NEPHROSTOMY TUBE PLACEMENT ([place] HX) Left   . URETEROSCOPY WITH HOLMIUM LASER LITHOTRIPSY Left 09/07/2015   Procedure: URETEROSCOPY WITH HOLMIUM LASER LITHOTRIPSY;  Surgeon: Hollice Espy, MD;  Location: [facility];  Service: Urology;  Laterality: Left;     No outpatient medications have been marked as taking for the 06/12/19 encounter (Telemedicine) with Loel Dubonnet, NP.     Allergies:   Other   Social History   Tobacco Use  . Smoking status: Never Smoker  . Smokeless tobacco: Never Used  Substance Use Topics  . Alcohol use: Yes    Alcohol/week: 0.0 standard drinks    Comment: occasionally  . Drug use: No    Types: Marijuana, Cocaine, Opium, LSD    Comment: Past, greater than 10 years ago     Family Hx: The patient's family history includes Heart attack in his brother; Heart failure in his father. There is no history of Kidney cancer, Bladder Cancer, or Prostate cancer.  ROS:   Please see the history of present illness.    Review of Systems  Constitution: Negative for chills, fever  and malaise/fatigue.  Cardiovascular: Positive for dyspnea on exertion. Negative for chest pain, leg swelling, near-syncope, orthopnea, palpitations and syncope.  Respiratory: Negative for cough, shortness of breath and wheezing.   Gastrointestinal: Negative for nausea and vomiting.  Neurological: Negative for dizziness, light-headedness and weakness.   All other systems reviewed and are negative.   Prior CV studies:   The following studies were reviewed today: Echo 11/2016 - Left ventricle: The cavity size was normal. Systolic function was   normal. The estimated ejection fraction was in the range of 55%   to 60%. Wall motion was normal; there were no regional wall   motion abnormalities. Doppler parameters are consistent with   abnormal left ventricular relaxation (grade 1 diastolic   dysfunction). - Left atrium: The atrium was mildly dilated. - Right ventricle: Systolic function was normal. - Pulmonary arteries: Systolic pressure was within the normal   range.  Labs/Other Tests and Data Reviewed:    EKG:  No ECG reviewed.  Recent Labs: 04/10/2019: ALT 26; BUN 28; Creatinine, Ser  1.58; Hemoglobin 14.1; Platelets 202; Potassium 4.0; Sodium 137   Recent Lipid Panel Lab Results  Component Value Date/Time   CHOL 153 07/09/2015 04:55 AM   TRIG 168 (H) 07/09/2015 04:55 AM   HDL 34 (L) 07/09/2015 04:55 AM   CHOLHDL 4.5 07/09/2015 04:55 AM   LDLCALC 85 07/09/2015 04:55 AM    Wt Readings from Last 3 Encounters:  06/12/19 275 lb (124.7 kg)  05/21/19 277 lb (125.6 kg)  05/11/19 277 lb (125.6 kg)     Objective:    Vital Signs:  BP (!) 147/64   Pulse 66   Ht 6' (1.829 m)   Wt 275 lb (124.7 kg)   BMI 37.30 kg/m    VITAL SIGNS:  reviewed  ASSESSMENT & PLAN:    1. PAF -denies palpitations, recurrence.  Continue Coreg and amiodarone.  Anticoagulated on Eliquis. 2. Chronic anticoagulation - Secondary to PAF.  Denies bleeding complications.  Continue Eliquis 5 mg twice  daily. 3. On amiodarone therapy -secondary to PAF.  Liver enzymes 04/2019 normal.  Last TSH available to me 12/2017 1.83.  His amiodarone and additional medical therapies are prescribed and followed by his PCP.  Tells me he thinks he had a TSH done more recently but recommend he request with his next labs PCP. 4. Chronic HFpEF - GDMT includes beta-blocker, carvedilol, Lasix.  NYHA II with DOE that is stable his baseline.  Encourage low-sodium, heart healthy diet.  Encouraged increased physical activity as some of his dyspnea is likely related to deconditioning. 5. HTN -BP elevated today.  Tells me it is normally well controlled at home.  He will continue to monitor and report blood pressure consistently more than 130/80 at his primary care provider.  In the interim, continue present antihypertensive regimen. 6. HLD -follows with his PCP.  Continue rosuvastatin 20 mg. 7. CKD 3 -careful titration of antihypertensive and diuretic therapies.  Recent kidney function stable. 8. OSA -reports compliance with CPAP  COVID-19 Education: The signs and symptoms of COVID-19 were discussed with the patient and how to seek care for testing (follow up with PCP or arrange E-visit).  The importance of social distancing was discussed today.  Time:   Today, I have spent 17 minutes with the patient with telehealth technology discussing the above problems.     Medication Adjustments/Labs and Tests Ordered: Current medicines are reviewed at length with the patient today.  Concerns regarding medicines are outlined above.   Tests Ordered: No orders of the defined types were placed in this encounter.  Medication Changes: No orders of the defined types were placed in this encounter.   Follow Up:  In Person in 6 month(s)  Signed, Loel Dubonnet, NP  06/12/2019 10:47 AM    [place]

## 2019-06-19 ENCOUNTER — Ambulatory Visit
Admission: RE | Admit: 2019-06-19 | Discharge: 2019-06-19 | Disposition: A | Discharge status: Home / Self Care | Payer: Medicare Other | Source: Ambulatory Visit | Attending: Urology | Admitting: Urology

## 2019-06-19 ENCOUNTER — Ambulatory Visit
Admission: RE | Admit: 2019-06-19 | Discharge: 2019-06-19 | Disposition: A | Discharge status: Home / Self Care | Payer: Medicare Other | Attending: Urology | Admitting: Urology

## 2019-06-19 ENCOUNTER — Other Ambulatory Visit: Payer: Self-pay

## 2019-06-19 DIAGNOSIS — N1330 Unspecified hydronephrosis: Secondary | ICD-10-CM

## 2019-06-19 DIAGNOSIS — N201 Calculus of ureter: Secondary | ICD-10-CM | POA: Diagnosis not present

## 2019-06-19 IMAGING — CR DG ABDOMEN 1V
2 series · 2 of 2 positions shown · non-contrast
Comparison: [DATE]

CLINICAL DATA: Status post lithotripsy

EXAM:
ABDOMEN - 1 VIEW

[abdomen kub (1 of 2)]
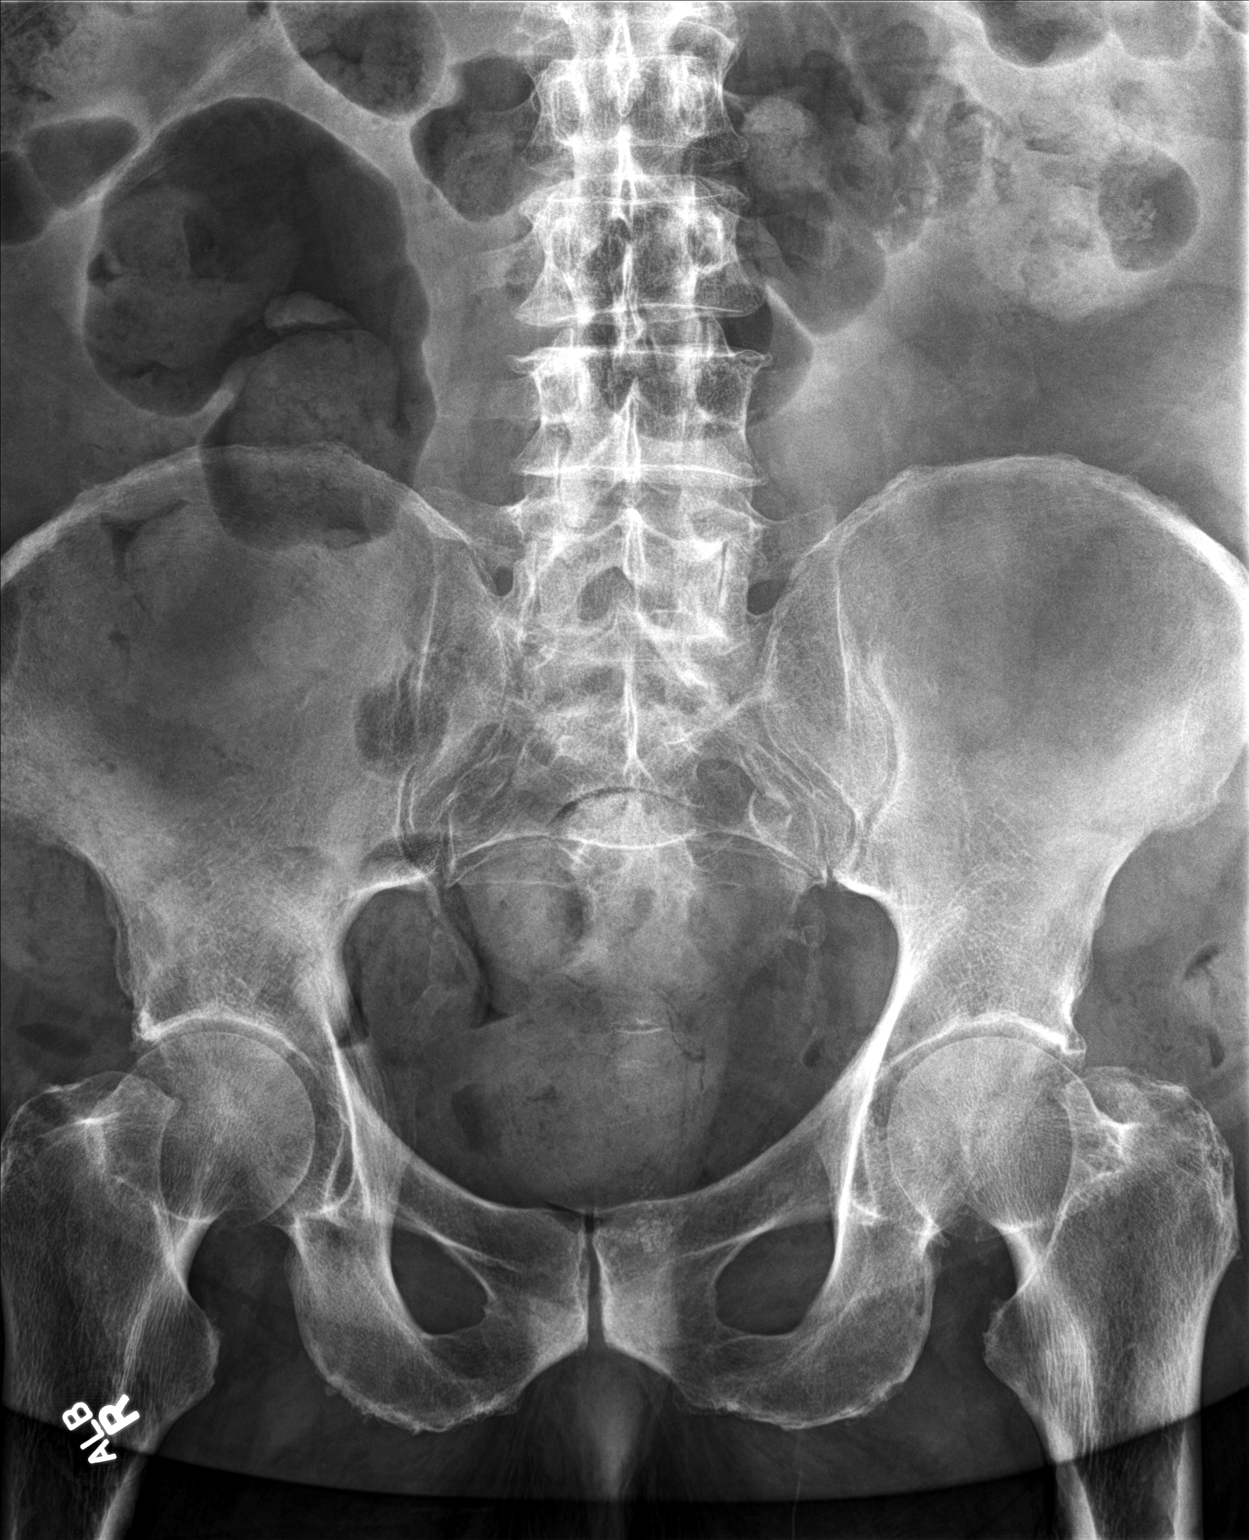

[abdomen kub (2 of 2)]
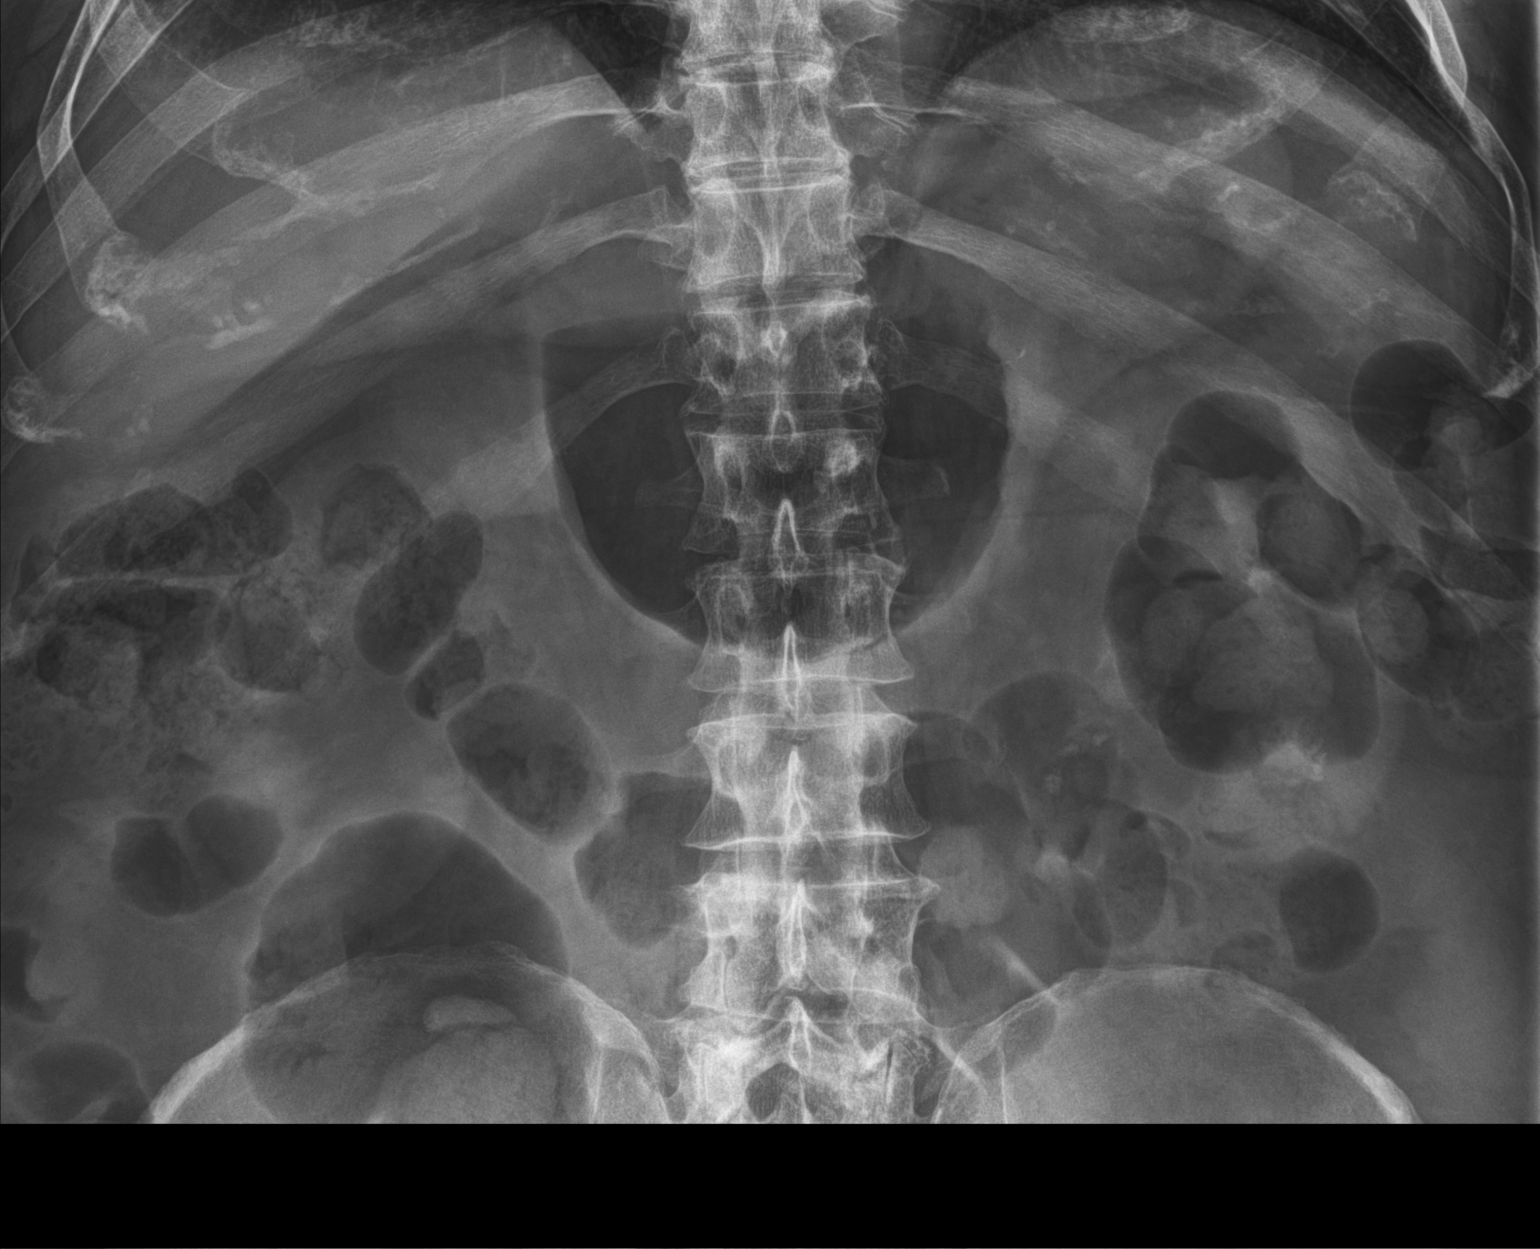

[2 of 2 positions shown; findings below may reference images not displayed]

FINDINGS: Copious stool and bowel gas limit evaluation for calculi.
Nonobstructed gas pattern. Decreased stone burden on the left with
suspected stone fragments overlying left renal pelvis and lower
pole, measuring up to 14 mm.
IMPRESSION: Nonobstructed gas pattern with large stool burden. Gas and stool
limit evaluation for radiopaque calculi. There appears to be overall
decreased stone burden on the left with probable stone fragments
measuring up to 14 mm.

## 2019-06-26 ENCOUNTER — Other Ambulatory Visit: Payer: Self-pay

## 2019-06-26 ENCOUNTER — Telehealth (INDEPENDENT_AMBULATORY_CARE_PROVIDER_SITE_OTHER): Payer: Medicare Other | Admitting: Urology

## 2019-06-26 DIAGNOSIS — N183 Chronic kidney disease, stage 3 unspecified: Secondary | ICD-10-CM

## 2019-06-26 DIAGNOSIS — N1330 Unspecified hydronephrosis: Secondary | ICD-10-CM | POA: Diagnosis not present

## 2019-06-26 DIAGNOSIS — N261 Atrophy of kidney (terminal): Secondary | ICD-10-CM | POA: Diagnosis not present

## 2019-06-26 DIAGNOSIS — N200 Calculus of kidney: Secondary | ICD-10-CM

## 2019-06-26 NOTE — Progress Notes (Signed)
Virtual Visit via Telephone Note  I connected with [name] on 06/26/19 at  1:30 PM EST by telephone and verified that I am speaking with the correct person using two identifiers.  Location: Patient: home Provider: office   I discussed the limitations, risks, security and privacy concerns of performing an evaluation and management service by telephone and the availability of in person appointments. I also discussed with the patient that there may be a patient responsible charge related to this service. The patient expressed understanding and agreed to proceed.   History of Present Illness: 69 year old male with history of left severe renal atrophy and recurrent nephrolithiasis who presents today for virtual visit following staged intervention including ESWL x2 as well as ureteroscopy to clear his large volume of left renal pelvic and kidney stone burden.  See previous notes for details.  He reports today that following stent removal, he was still passing a fairly significant amount of debris for several days but this is since subsided.  No further gross hematuria.  No flank pain.  In the interim, he is undergone KUB which did show some smaller fragments in the left renal pelvis as well as what is likely milk of calcium in the left lower pole.  Views personally reviewed today.  CLINICAL DATA:  Status post lithotripsy  EXAM: ABDOMEN - 1 VIEW  COMPARISON:  04/17/2019  FINDINGS: Copious stool and bowel gas limit evaluation for calculi. Nonobstructed gas pattern. Decreased stone burden on the left with suspected stone fragments overlying left renal pelvis and lower pole, measuring up to 14 mm.  IMPRESSION: Nonobstructed gas pattern with large stool burden. Gas and stool limit evaluation for radiopaque calculi. There appears to be overall decreased stone burden on the left with probable stone fragments measuring up to 14 mm.   Electronically Signed   By: Donavan Foil M.D.   On: 06/19/2019 15:55   Observations/Objective: Alert, asking good questions  Assessment and Plan:  1. Hydronephrosis, left Chronic, severe  We discussed today that the role of renal ultrasound in the situation is limited given the severity of his chronic hydronephrosis, will not be able to rule out or rule in ongoing obstruction.  No ureteral calculi appreciated in KUB.  2. Left renal atrophy Chronic, see previous notes  3. Kidney stones Status post multiple procedures  Reviewed KUB results today in detail.  Although he still has some radiopaque stones, overall stone burden is dramatically reduced.  [name] now asymptomatic.  We will continue to follow clinically this point time.  [name] agreeable this plan.  4. Stage 3 chronic kidney disease, unspecified whether stage 3a or 3b CKD Creatinine stable Followed by nephrology   Follow Up Instructions: 6 months with KUB or sooner as needed   I discussed the assessment and treatment plan with the patient. The patient was provided an opportunity to ask questions and all were answered. The patient agreed with the plan and demonstrated an understanding of the instructions.   The patient was advised to call back or seek an in-person evaluation if the symptoms worsen or if the condition fails to improve as anticipated.  I provided 8 minutes of non-face-to-face time during this encounter.   Hollice Espy, MD

## 2019-07-03 ENCOUNTER — Ambulatory Visit: Payer: Medicare Other | Admitting: Urology

## 2019-08-13 IMAGING — CR DG ABDOMEN 1V
3 series · 3 of 3 positions shown · non-contrast
Comparison: [DATE]

CLINICAL DATA: Left renal calculus, dysuria

EXAM:
ABDOMEN - 1 VIEW

[abdomen kub (1 of 3)]
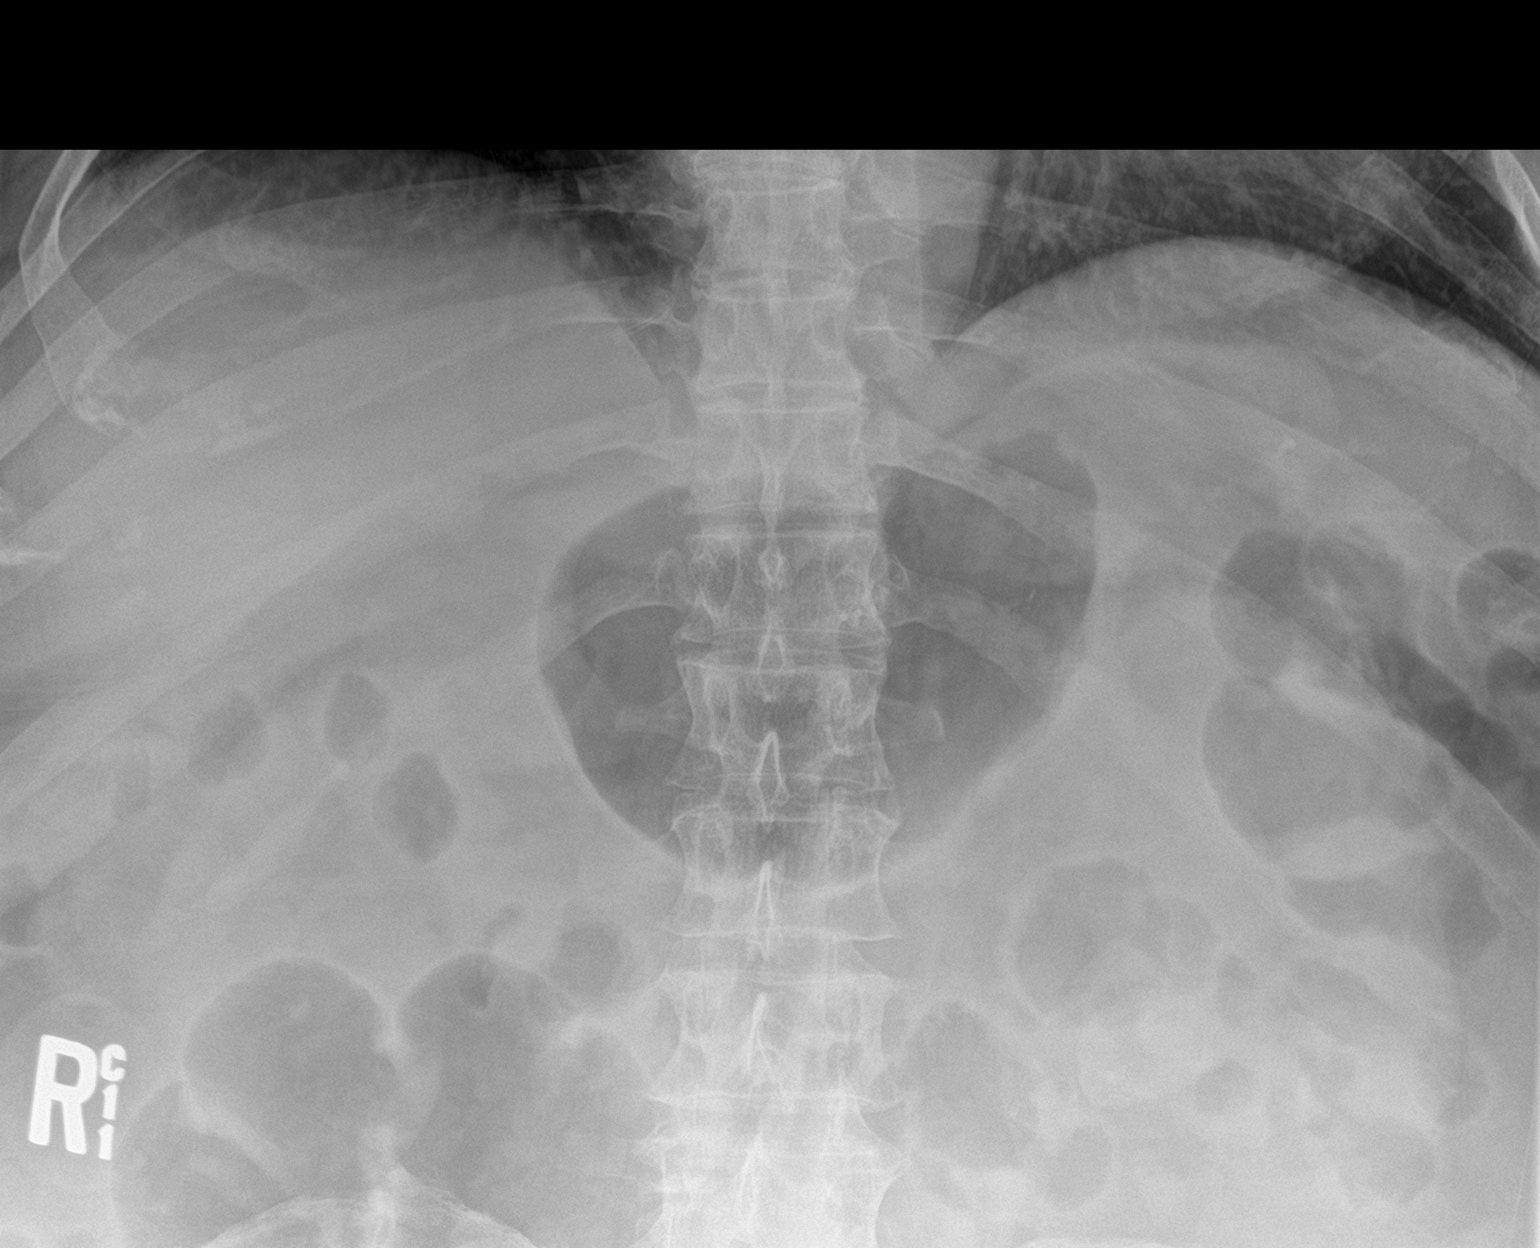

[abdomen kub (2 of 3)]
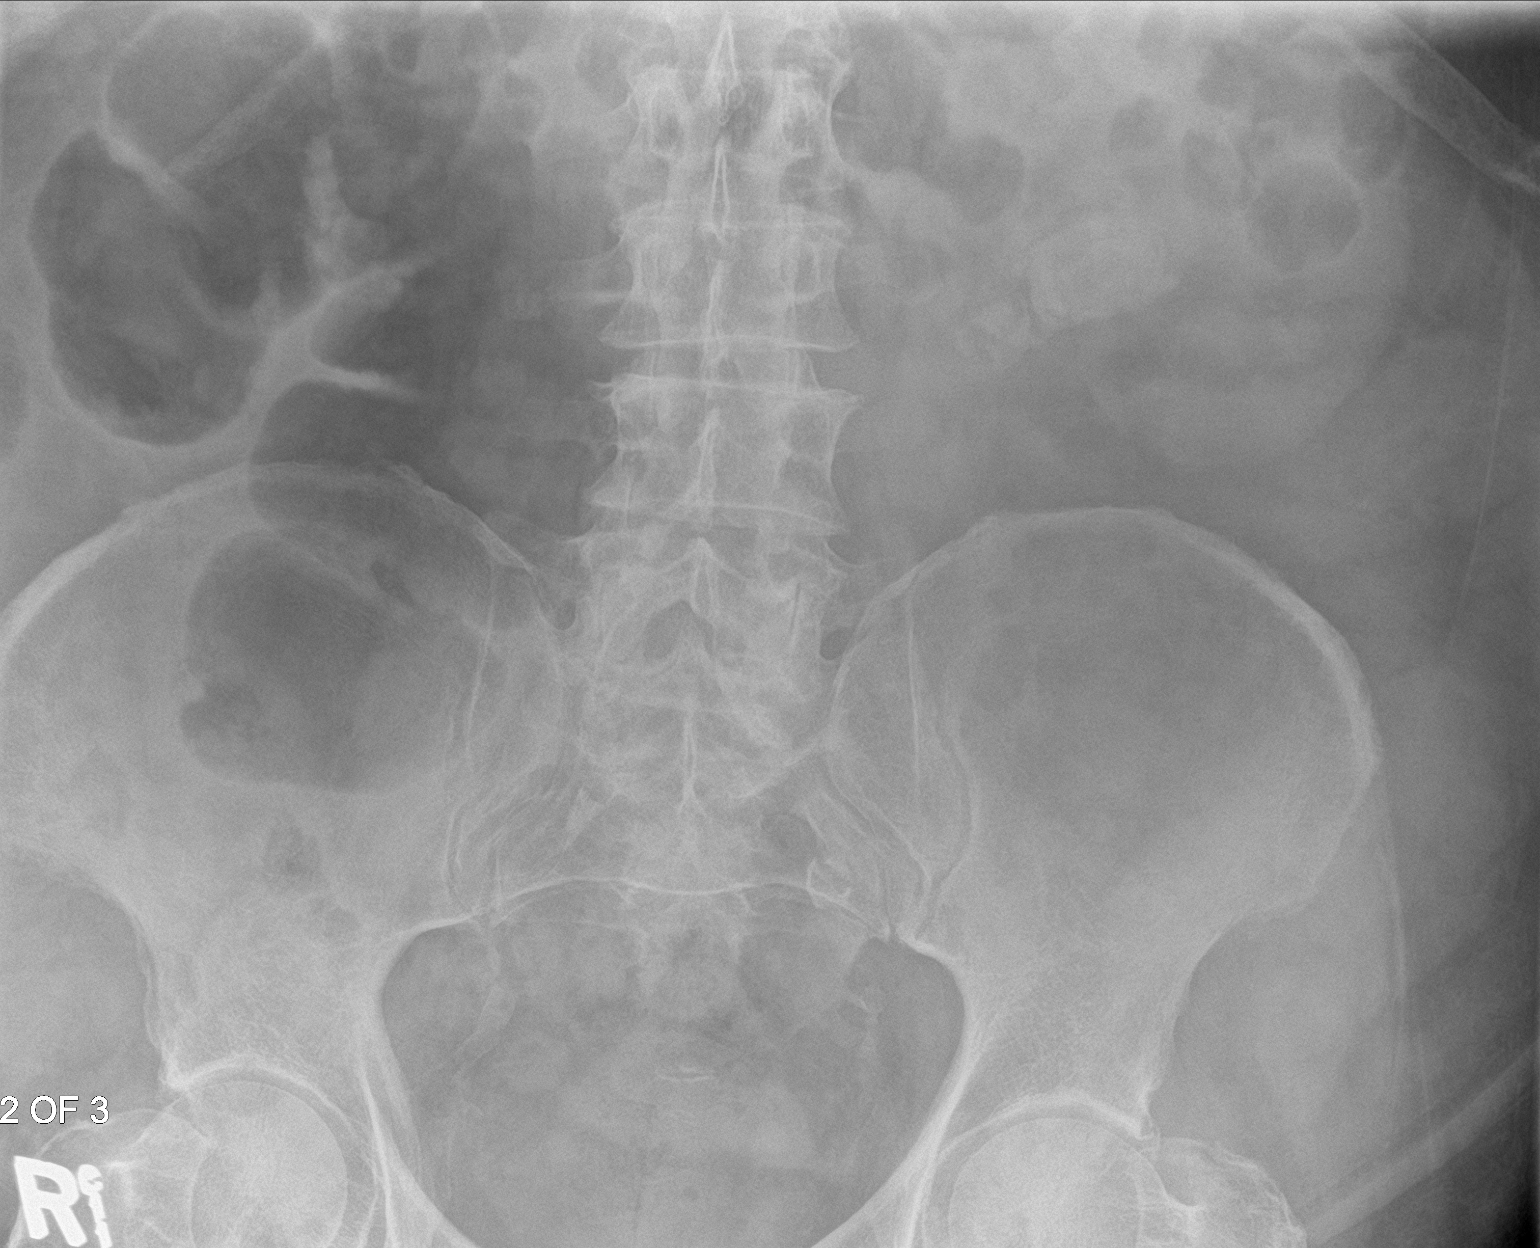

[abdomen kub (3 of 3)]
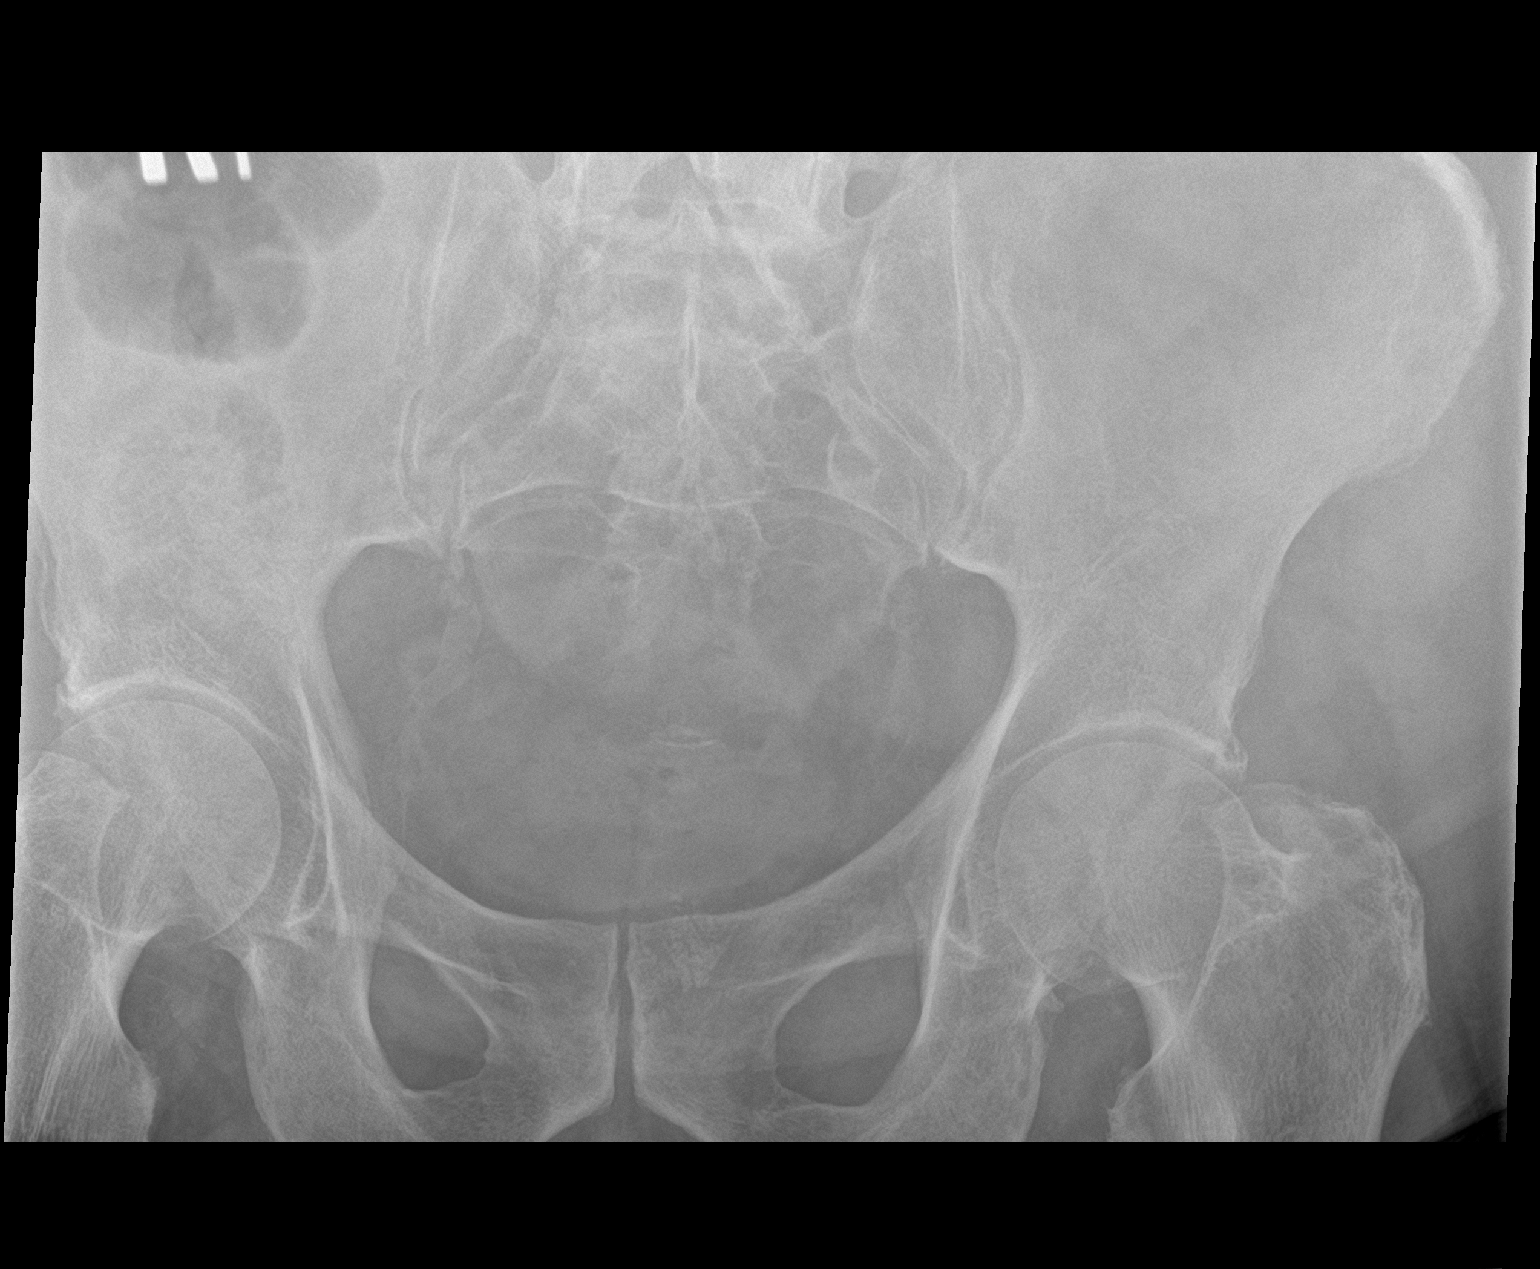

[3 of 3 positions shown; findings below may reference images not displayed]

FINDINGS: Three supine frontal views of the abdomen and pelvis are obtained.
Bilateral flanks are excluded by collimation. Large calculus
overlying the left kidney measures 4.3 cm, consistent with the
staghorn calculus seen on prior CT. No definite right-sided calculi.
The bowel gas pattern is unremarkable. No acute bony abnormalities.
IMPRESSION: 1. Large left renal calculus measuring 4.3 cm.

## 2019-09-18 ENCOUNTER — Other Ambulatory Visit: Payer: Self-pay

## 2019-09-18 ENCOUNTER — Ambulatory Visit
Admission: RE | Admit: 2019-09-18 | Discharge: 2019-09-18 | Disposition: A | Discharge status: Home / Self Care | Payer: Medicare Other | Source: Ambulatory Visit | Attending: Physician Assistant | Admitting: Physician Assistant

## 2019-09-18 ENCOUNTER — Telehealth: Payer: Self-pay | Admitting: *Deleted

## 2019-09-18 ENCOUNTER — Encounter: Payer: Self-pay | Admitting: Physician Assistant

## 2019-09-18 ENCOUNTER — Other Ambulatory Visit: Payer: Self-pay | Admitting: *Deleted

## 2019-09-18 ENCOUNTER — Ambulatory Visit (INDEPENDENT_AMBULATORY_CARE_PROVIDER_SITE_OTHER): Payer: Medicare Other | Admitting: Physician Assistant

## 2019-09-18 VITALS — BP 107/62 | HR 80 | Temp 98.6°F | Ht 72.0 in | Wt 275.0 lb

## 2019-09-18 DIAGNOSIS — R109 Unspecified abdominal pain: Secondary | ICD-10-CM | POA: Diagnosis not present

## 2019-09-18 DIAGNOSIS — N200 Calculus of kidney: Secondary | ICD-10-CM

## 2019-09-18 DIAGNOSIS — Z87442 Personal history of urinary calculi: Secondary | ICD-10-CM

## 2019-09-18 LAB — URINALYSIS, COMPLETE
Bilirubin, UA: NEGATIVE
Ketones, UA: NEGATIVE
Nitrite, UA: NEGATIVE
Protein,UA: NEGATIVE
Specific Gravity, UA: 1.01 (ref 1.005–1.030)
Urobilinogen, Ur: 0.2 mg/dL (ref 0.2–1.0)
pH, UA: 5.5 (ref 5.0–7.5)

## 2019-09-18 LAB — BLADDER SCAN AMB NON-IMAGING: Scan Result: 197

## 2019-09-18 LAB — MICROSCOPIC EXAMINATION: WBC, UA: >30 /hpf — AB (ref 0–5)

## 2019-09-18 MED ORDER — OXYCODONE-ACETAMINOPHEN 5-325 MG PO TABS: 1.0000 | ORAL_TABLET | Freq: Four times a day (QID) | ORAL | 0 refills | Status: AC | PRN

## 2019-09-18 MED ORDER — CEFDINIR 300 MG PO CAPS: 300.0000 mg | ORAL_CAPSULE | Freq: Two times a day (BID) | ORAL | 0 refills | Status: AC

## 2019-09-18 MED ORDER — CEFTRIAXONE SODIUM 1 G IJ SOLR
1.0000 g | Freq: Once | INTRAMUSCULAR | Status: AC
  Administered 2019-09-18: 1 g via INTRAMUSCULAR

## 2019-09-18 NOTE — Progress Notes (Signed)
09/18/2019 3:36 PM   [name] [birth year] [id number]  CC: Left flank pain, dysuria, passage of urinary debris/stones  HPI: [name] is a 69 y.o. male with PMH severe left renal atrophy and recurrent nephrolithiasis who recently underwent staged procedure for management of a large volume of left renal calculi who presents today for evaluation of possible UTI versus stone episode. [name] an established [facility] patient who last saw Dr. Erlene Quan on 06/26/2019 for follow-up of the above.  Today, patient reports an approximate 4-day history of low-grade subjective fever, left flank pain, intermittent gross hematuria, chills, malodorous urine, and passage of urinary debris/stone material.  He describes the urinary debris as varying in color between white and reddish, with pieces large enough to resemble curds of cottage cheese.  He has been straining his urine since symptom onset.  He took Flomax and oxybutynin yesterday for management of his symptoms.  KUB today revealed a stable left renal calculus without ureteral stones.  In-office UA today positive for 2+ blood and 2+ leukocyte esterase; urine microscopy with >30 WBCs/HPF and 3-10 RBCs/HPF.  PMH: Past Medical History:  Diagnosis Date  . Cardiomyopathy ([place])   . Chronic combined systolic (congestive) and diastolic (congestive) heart failure (HCC)    a. EF 25% by cath in 2013 b. Echo in 07/2015 showing EF of 15-20%, moderate MR, moderate Pulm HTN, severely dilated IVC  . CKD (chronic kidney disease) stage 3, GFR 30-59 ml/min   . Coronary artery disease   . Diabetes mellitus type 2, uncontrolled ([place])    a. A1c 11.0 in 07/2015.  [name] Hypertension   . Kidney stones    Left  . New onset atrial fibrillation ([place])    a. diagnosed in 07/2015 b. started on Eliquis  . Paroxysmal atrial fibrillation (HCC)   . Pollen allergies 09-21-15   pt called and stated that he woke up and had some drainage-pt states he did not see the color of  the drainage-pt denies running a fever and this only happened once-pt instructed to call Dr Audree Bane office if he starts running a fever or if the color of drainage becomes yellow/green  . Psoriasis   . Pulmonary hypertension ([place])   . Renal disorder    kidney stone  . Sleep apnea     Surgical History: Past Surgical History:  Procedure Laterality Date  . CARDIAC CATHETERIZATION N/A 07/25/2015   Procedure: Right/Left Heart Cath and Coronary Angiography;  Surgeon: Wellington Hampshire, MD;  Location: [facility] [facility];  Service: Cardiovascular;  Laterality: N/A;  . CARDIAC [place]     [place]  . CARDIAC CATHETERIZATION     [facility], [place]    . CYSTOSCOPY WITH STENT PLACEMENT Left 09/07/2015   Procedure: CYSTOSCOPY WITH STENT PLACEMENT;  Surgeon: Hollice Espy, MD;  Location: [facility];  Service: Urology;  Laterality: Left;  . CYSTOSCOPY/URETEROSCOPY/HOLMIUM LASER/STENT PLACEMENT Left 10/25/2015   Procedure: CYSTOSCOPY/URETEROSCOPY/HOLMIUM LASER/STENT EXCHANGE;  Surgeon: Hollice Espy, MD;  Location: [facility];  Service: Urology;  Laterality: Left;  . CYSTOSCOPY/URETEROSCOPY/HOLMIUM LASER/STENT PLACEMENT Left 05/11/2019   Procedure: CYSTOSCOPY/URETEROSCOPY/HOLMIUM LASER/STENT PLACEMENT;  Surgeon: Hollice Espy, MD;  Location: [facility];  Service: Urology;  Laterality: Left;  . EXTRACORPOREAL SHOCK WAVE LITHOTRIPSY Left 04/02/2019   Procedure: EXTRACORPOREAL SHOCK WAVE LITHOTRIPSY (ESWL);  Surgeon: Hollice Espy, MD;  Location: [facility];  Service: Urology;  Laterality: Left;  . EXTRACORPOREAL SHOCK WAVE LITHOTRIPSY Left 03/05/2019   Procedure: EXTRACORPOREAL SHOCK WAVE LITHOTRIPSY (  ESWL);  Surgeon: Abbie Sons, MD;  Location: [facility];  Service: Urology;  Laterality: Left;  . KIDNEY SURGERY    . NEPHROSTOMY TUBE PLACEMENT ([place] HX) Left   . URETEROSCOPY WITH HOLMIUM LASER LITHOTRIPSY Left 09/07/2015   Procedure: URETEROSCOPY WITH  HOLMIUM LASER LITHOTRIPSY;  Surgeon: Hollice Espy, MD;  Location: [facility];  Service: Urology;  Laterality: Left;    Home Medications:  Allergies as of 09/18/2019      Reactions   Other Hives   Blue cheese      Medication List       Accurate as of September 18, 2019  3:36 PM. If you have any questions, ask your nurse or doctor.        STOP taking these medications   erythromycin ophthalmic ointment Stopped by: Debroah Loop, PA-C   tamsulosin 0.4 MG Caps capsule Commonly known as: FLOMAX Stopped by: Debroah Loop, PA-C     TAKE these medications   amiodarone 200 MG tablet Commonly known as: PACERONE Take 1 tablet (200 mg total) by mouth daily. What changed: when to take this   apixaban 5 MG Tabs tablet Commonly known as: Eliquis Take 1 tablet (5 mg total) 2 (two) times daily by mouth.   b complex vitamins tablet Take 1 tablet by mouth every evening.   baclofen 10 MG tablet Commonly known as: LIORESAL Take 10 mg by mouth every evening.   carvedilol 6.25 MG tablet Commonly known as: COREG TAKE 1 TABLET BY MOUTH TWICE DAILY   cefdinir 300 MG capsule Commonly known as: OMNICEF Take 1 capsule (300 mg total) by mouth 2 (two) times daily for 10 days. Started by: Debroah Loop, PA-C   clonazePAM 1 MG tablet Commonly known as: KLONOPIN Take 1 mg by mouth at bedtime.   CoQ10 400 MG Caps Take 400 mg by mouth 2 (two) times daily.   dextromethorphan-guaiFENesin 30-600 MG 12hr tablet Commonly known as: MUCINEX DM Take 1 tablet by mouth 2 (two) times daily.   docusate sodium 100 MG capsule Commonly known as: COLACE Take 1 capsule (100 mg total) by mouth 2 (two) times daily. What changed:   how much to take  when to take this  additional instructions   furosemide 80 MG tablet Commonly known as: LASIX Take 1 tablet (80 mg total) by mouth daily.   glipiZIDE 10 MG tablet Commonly known as: GLUCOTROL Take 10 mg by mouth 2 (two) times  daily.   Hawthorne Berry 550 MG Caps Take 550 mg by mouth every evening.   Jardiance 10 MG Tabs tablet Generic drug: empagliflozin Take 10 mg by mouth every evening.   losartan 25 MG tablet Commonly known as: COZAAR Take 25 mg by mouth every evening.   magnesium oxide 400 MG tablet Commonly known as: MAG-OX Take 400 mg by mouth every morning.   montelukast 10 MG tablet Commonly known as: SINGULAIR Take 10 mg by mouth at bedtime.   oxyCODONE-acetaminophen 5-325 MG tablet Commonly known as: PERCOCET/ROXICET Take 1-2 tablets by mouth every 6 (six) hours as needed for up to 5 days for severe pain. What changed:   when to take this  reasons to take this Changed by: Debroah Loop, PA-C   Ozempic (1 MG/DOSE) 2 MG/1.5ML Sopn Generic drug: Semaglutide (1 MG/DOSE) Inject 1 mg into the skin every Monday. In the evening.   polyethylene glycol 17 g packet Commonly known as: MIRALAX / GLYCOLAX Take 17 g by mouth daily as needed for mild constipation.  POTASSIMIN PO Take 1 tablet by mouth every evening.   PRESERVISION AREDS 2 PO Take 1 tablet by mouth 2 (two) times daily.   rosuvastatin 20 MG tablet Commonly known as: CRESTOR Take 20 mg by mouth every evening.   Systane 0.4-0.3 % Soln Generic drug: Polyethyl Glycol-Propyl Glycol Place 1 drop into both eyes 3 (three) times daily as needed (dry/irritated eyes.).   Toujeo Max SoloStar 300 UNIT/ML Solostar Pen Generic drug: insulin glargine (2 Unit Dial) Inject 42 Units into the skin every morning. Titrate up to 60 units/day, per endocrinology instructions.   vitamin C 1000 MG tablet Take 1,000 mg by mouth daily.   Vitamin D (Ergocalciferol) 1.25 MG (50000 UNIT) Caps capsule Commonly known as: DRISDOL Take 50,000 Units by mouth every Monday.   vitamin E 180 MG (400 UNITS) capsule Take 400 Units by mouth every 14 (fourteen) days.       Allergies:  Allergies  Allergen Reactions  . Other Hives    Blue  cheese    Family History: Family History  Problem Relation Age of Onset  . Heart failure Father   . Heart attack Brother   . Kidney cancer Neg Hx   . Bladder Cancer Neg Hx   . Prostate cancer Neg Hx     Social History:   reports that he has never smoked. He has never used smokeless tobacco. He reports current alcohol use. He reports that he does not use drugs.  Physical Exam: BP 107/62   Pulse 80   Temp 98.6 F (37 C) (Oral)   Ht 6' (1.829 m)   Wt 275 lb (124.7 kg)   BMI 37.30 kg/m   Constitutional:  Alert and oriented, no acute distress, nontoxic appearing HEENT: [place], AT Cardiovascular: No clubbing, cyanosis, or edema Respiratory: Normal respiratory effort, no increased work of breathing Skin: No rashes, bruises or suspicious lesions Neurologic: Grossly intact, no focal deficits, moving all 4 extremities Psychiatric: Normal mood and affect  Laboratory Data: Results for orders placed or performed in visit on 09/18/19  CULTURE, URINE COMPREHENSIVE   Specimen: Urine   UR  Result Value Ref Range   Urine Culture, Comprehensive Preliminary report    Organism ID, Bacteria Comment   Microscopic Examination   URINE  Result Value Ref Range   WBC, UA >30 (A) 0 - 5 /hpf   RBC 3-10 (A) 0 - 2 /hpf   Epithelial Cells (non renal) 0-10 0 - 10 /hpf   Bacteria, UA Few None seen/Few  Urinalysis, Complete  Result Value Ref Range   Specific Gravity, UA 1.010 1.005 - 1.030   pH, UA 5.5 5.0 - 7.5   Color, UA Yellow Yellow   Appearance Ur Cloudy (A) Clear   Leukocytes,UA 2+ (A) Negative   Protein,UA Negative Negative/Trace   Glucose, UA 1+ (A) Negative   Ketones, UA Negative Negative   RBC, UA 2+ (A) Negative   Bilirubin, UA Negative Negative   Urobilinogen, Ur 0.2 0.2 - 1.0 mg/dL   Nitrite, UA Negative Negative   Microscopic Examination See below:   Bladder Scan (Post Void Residual) in office  Result Value Ref Range   Scan Result 197    Pertinent Imaging: KUB,  09/18/2019: CLINICAL DATA:  Left renal calculus, dysuria  EXAM: ABDOMEN - 1 VIEW  COMPARISON:  06/19/2019  FINDINGS: Three supine frontal views of the abdomen and pelvis are obtained. Bilateral flanks are excluded by collimation. 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 No definite right-sided calculi. The bowel gas pattern is unremarkable. No acute bony abnormalities.  IMPRESSION: 1. Large left renal calculus measuring 4.3 cm.   Electronically Signed   By: Randa Ngo M.D.   On: 09/19/2019 02:59  I personally reviewed the images referenced above and note no left ureteral stones.  Assessment & Plan:   1. Flank pain with history of urolithiasis 69 year old male with an extensive history of left-sided renal stones presents with a 4-day history of subjective fever, malodorous urine, and passage of urinary debris/stones, among others.  UA notable for pyuria and microscopic hematuria today, will send for culture for further evaluation.  Patient is afebrile, VSS today.  Due to complex history of urolithiasis, I elected to proceed with empiric antibiotics including a dose of IM ceftriaxone in office today.  We will also offer patient Percocet for management of pain for likely stone episode despite clear KUB.  Counseled him to start Flomax daily x4 weeks with symptom recheck and repeat UA to prove resolution of microscopic hematuria.  Counseled patient to proceed to the emergency department immediately if he develops refractory left flank pain, fever greater than 101.5 F, chills, nausea, and vomiting.  He expressed understanding. - Urinalysis, Complete - CULTURE, URINE COMPREHENSIVE - cefTRIAXone (ROCEPHIN) injection 1 g - cefdinir (OMNICEF) 300 MG capsule; Take 1 capsule (300 mg total) by mouth 2 (two) times daily for 10 days.  Dispense: 20 capsule; Refill: 0 - oxyCODONE-acetaminophen (PERCOCET/ROXICET)  5-325 MG tablet; Take 1-2 tablets by mouth every 6 (six) hours as needed for up to 5 days for severe pain.  Dispense: 12 tablet; Refill: 0 - Bladder Scan (Post Void Residual) in office   Return in about 4 weeks (around 10/16/2019) for Stone f/u with repeat UA.  Debroah Loop, PA-C  [facility] [address] [place], [place] [address] [phone]

## 2019-09-18 NOTE — Telephone Encounter (Signed)
Patient states he started running a  Low grade fever at the beginning of the week. On tuesday he started have left back pain . Wednesday he started passing a little blood in his urine. Thursday he noticed some cream white gray urine.

## 2019-09-18 NOTE — Patient Instructions (Addendum)
1. Start Flomax 0.4mg  daily x4 weeks. 2. You received a shot of ceftriaxone in office today. Start prescribed oral Cefdinir twice daily x10 days when you pick it up from the pharmacy, no later than tomorrow morning. 3. Stay well hydrated and strain your urine. 4. Take Percocet as needed to manage your pain. 5. If you develop severe pain that cannot be managed by Percocet, nausea, vomiting, fever greater than 101.48F, or chills, go to the Emergency Department immediately.

## 2019-09-18 NOTE — Telephone Encounter (Signed)
Patient is coming in today for a KUB and ua.

## 2019-09-18 NOTE — Telephone Encounter (Signed)
Patient states he started running a  low grade fever at the beginning of the week. On Tuesday he started having left back pain , Wednesday he started passing a little blood in his urine. Thursday he noticed some cream white gray urine.

## 2019-09-21 ENCOUNTER — Encounter: Payer: Self-pay | Admitting: Urology

## 2019-09-24 LAB — CULTURE, URINE COMPREHENSIVE

## 2019-10-19 ENCOUNTER — Ambulatory Visit: Payer: Self-pay | Admitting: Physician Assistant

## 2019-10-19 ENCOUNTER — Encounter: Payer: Self-pay | Admitting: Physician Assistant

## 2019-10-19 ENCOUNTER — Other Ambulatory Visit: Payer: Self-pay

## 2019-10-19 ENCOUNTER — Ambulatory Visit (INDEPENDENT_AMBULATORY_CARE_PROVIDER_SITE_OTHER): Payer: Medicare Other | Admitting: Physician Assistant

## 2019-10-19 VITALS — BP 117/68 | HR 80 | Ht 72.0 in | Wt 277.4 lb

## 2019-10-19 DIAGNOSIS — N200 Calculus of kidney: Secondary | ICD-10-CM | POA: Diagnosis not present

## 2019-10-19 LAB — MICROSCOPIC EXAMINATION: WBC, UA: >30 /hpf — AB (ref 0–5)

## 2019-10-19 LAB — URINALYSIS, COMPLETE
Bilirubin, UA: NEGATIVE
Ketones, UA: NEGATIVE
Nitrite, UA: NEGATIVE
Protein,UA: NEGATIVE
Specific Gravity, UA: 1.015 (ref 1.005–1.030)
Urobilinogen, Ur: 0.2 mg/dL (ref 0.2–1.0)
pH, UA: 7 (ref 5.0–7.5)

## 2019-10-19 MED ORDER — OXYCODONE-ACETAMINOPHEN 5-325 MG PO TABS: 1.0000 | ORAL_TABLET | Freq: Four times a day (QID) | ORAL | 0 refills | Status: AC | PRN

## 2019-10-19 MED ORDER — TAMSULOSIN HCL 0.4 MG PO CAPS: 0.4000 mg | ORAL_CAPSULE | Freq: Every day | ORAL | 0 refills | Status: DC

## 2019-10-19 NOTE — Progress Notes (Signed)
10/19/2019 3:59 PM   [name] [facility] [birth year] [id number]  CC: Chief Complaint  Patient presents with  . Follow-up    HPI: [name] is a 69 y.o. male with PMH severe left renal atrophy and recurrent nephrolithiasis who recently underwent staged procedure for management of a large volume of left renal calculi who presents today for follow-up of acute stone episode/UTI. I saw him in clinic on 09/18/2019 with reports of subjective fever, left flank pain, intermittent gross hematuria, chills, malodorous urine, and passage of urinary debris/stone material. Urine culture ultimately grew Staph epidermidis and Staph hominis; I treated him with a dose of IM ceftriaxone in office and PO cefdinir 300mg  BID x10 days.  Today, patient reports feeling better.  He feels his urinary infection has resolved.  He denies fever, chills, nausea, vomiting, and gross hematuria.  He reports continued stone passage, including 1 notably large fragment last week.  He continues to have intermittent dysuria associated with stone passage.  He has continued Flomax and used Percocet as needed for management of flank pain.  In-office UA today positive for 1+ blood and 3+ leukocyte esterase; urine microscopy with >30 WBCs/HPF and 3-10 RBCs/HPF.  PMH: Past Medical History:  Diagnosis Date  . Cardiomyopathy ([place])   . Chronic combined systolic (congestive) and diastolic (congestive) heart failure (HCC)    a. EF 25% by cath in 2013 b. Echo in 07/2015 showing EF of 15-20%, moderate MR, moderate Pulm HTN, severely dilated IVC  . CKD (chronic kidney disease) stage 3, GFR 30-59 ml/min   . Coronary artery disease   . Diabetes mellitus type 2, uncontrolled ([place])    a. A1c 11.0 in 07/2015.  [name] Hypertension   . Kidney stones    Left  . New onset atrial fibrillation ([place])    a. diagnosed in 07/2015 b. started on Eliquis  . Paroxysmal atrial fibrillation (HCC)   . Pollen allergies 09-21-15   pt called and stated that  he woke up and had some drainage-pt states he did not see the color of the drainage-pt denies running a fever and this only happened once-pt instructed to call Dr Audree Bane office if he starts running a fever or if the color of drainage becomes yellow/green  . Psoriasis   . Pulmonary hypertension ([place])   . Renal disorder    kidney stone  . Sleep apnea     Surgical History: Past Surgical History:  Procedure Laterality Date  . CARDIAC CATHETERIZATION N/A 07/25/2015   Procedure: Right/Left Heart Cath and Coronary Angiography;  Surgeon: Wellington Hampshire, MD;  Location: [facility];  Service: Cardiovascular;  Laterality: N/A;  . CARDIAC [place]IZ[place]     [place]  . CARDIAC CATHETERIZATION     [facility], [place]    . CYSTOSCOPY WITH STENT PLACEMENT Left 09/07/2015   Procedure: CYSTOSCOPY WITH STENT PLACEMENT;  Surgeon: Hollice Espy, MD;  Location: [facility];  Service: Urology;  Laterality: Left;  . CYSTOSCOPY/URETEROSCOPY/HOLMIUM LASER/STENT PLACEMENT Left 10/25/2015   Procedure: CYSTOSCOPY/URETEROSCOPY/HOLMIUM LASER/STENT EXCHANGE;  Surgeon: Hollice Espy, MD;  Location: [facility];  Service: Urology;  Laterality: Left;  . CYSTOSCOPY/URETEROSCOPY/HOLMIUM LASER/STENT PLACEMENT Left 05/11/2019   Procedure: CYSTOSCOPY/URETEROSCOPY/HOLMIUM LASER/STENT PLACEMENT;  Surgeon: Hollice Espy, MD;  Location: [facility];  Service: Urology;  Laterality: Left;  . EXTRACORPOREAL SHOCK WAVE LITHOTRIPSY Left 04/02/2019   Procedure: EXTRACORPOREAL SHOCK WAVE LITHOTRIPSY (ESWL);  Surgeon: Hollice Espy, MD;  Location: [facility];  Service: Urology;  Laterality: Left;  .  EXTRACORPOREAL SHOCK WAVE LITHOTRIPSY Left 03/05/2019   Procedure: EXTRACORPOREAL SHOCK WAVE LITHOTRIPSY (ESWL);  Surgeon: Abbie Sons, MD;  Location: [facility];  Service: Urology;  Laterality: Left;  . KIDNEY SURGERY    . NEPHROSTOMY TUBE PLACEMENT ([place] HX) Left   . URETEROSCOPY WITH  HOLMIUM LASER LITHOTRIPSY Left 09/07/2015   Procedure: URETEROSCOPY WITH HOLMIUM LASER LITHOTRIPSY;  Surgeon: Hollice Espy, MD;  Location: [facility];  Service: Urology;  Laterality: Left;    Home Medications:  Allergies as of 10/19/2019      Reactions   Other Hives   Blue cheese      Medication List       Accurate as of Oct 19, 2019  3:59 PM. If you have any questions, ask your nurse or doctor.        amiodarone 200 MG tablet Commonly known as: PACERONE Take 1 tablet (200 mg total) by mouth daily. What changed: when to take this   apixaban 5 MG Tabs tablet Commonly known as: Eliquis Take 1 tablet (5 mg total) 2 (two) times daily by mouth.   b complex vitamins tablet Take 1 tablet by mouth every evening.   baclofen 10 MG tablet Commonly known as: LIORESAL Take 10 mg by mouth every evening.   carvedilol 6.25 MG tablet Commonly known as: COREG TAKE 1 TABLET BY MOUTH TWICE DAILY   clonazePAM 1 MG tablet Commonly known as: KLONOPIN Take 1 mg by mouth at bedtime.   CoQ10 400 MG Caps Take 400 mg by mouth 2 (two) times daily.   dextromethorphan-guaiFENesin 30-600 MG 12hr tablet Commonly known as: MUCINEX DM Take 1 tablet by mouth 2 (two) times daily.   docusate sodium 100 MG capsule Commonly known as: COLACE Take 1 capsule (100 mg total) by mouth 2 (two) times daily. What changed:   how much to take  when to take this  additional instructions   furosemide 80 MG tablet Commonly known as: LASIX Take 1 tablet (80 mg total) by mouth daily.   glipiZIDE 10 MG tablet Commonly known as: GLUCOTROL Take 10 mg by mouth 2 (two) times daily.   Hawthorne Berry 550 MG Caps Take 550 mg by mouth every evening.   Jardiance 10 MG Tabs tablet Generic drug: empagliflozin Take 10 mg by mouth every evening.   losartan 25 MG tablet Commonly known as: COZAAR Take 25 mg by mouth every evening.   magnesium oxide 400 MG tablet Commonly known as: MAG-OX Take 400 mg by mouth  every morning.   montelukast 10 MG tablet Commonly known as: SINGULAIR Take 10 mg by mouth at bedtime.   oxyCODONE-acetaminophen 5-325 MG tablet Commonly known as: PERCOCET/ROXICET Take 1-2 tablets by mouth every 6 (six) hours as needed for up to 5 days for severe pain. Started by: Debroah Loop, PA-C   Ozempic (1 MG/DOSE) 2 MG/1.5ML Sopn Generic drug: Semaglutide (1 MG/DOSE) Inject 1 mg into the skin every Monday. In the evening.   polyethylene glycol 17 g packet Commonly known as: MIRALAX / GLYCOLAX Take 17 g by mouth daily as needed for mild constipation.   POTASSIMIN PO Take 1 tablet by mouth every evening.   PRESERVISION AREDS 2 PO Take 1 tablet by mouth 2 (two) times daily.   rosuvastatin 20 MG tablet Commonly known as: CRESTOR Take 20 mg by mouth every evening.   Systane 0.4-0.3 % Soln Generic drug: Polyethyl Glycol-Propyl Glycol Place 1 drop into both eyes 3 (three) times daily as needed (dry/irritated eyes.).  tamsulosin 0.4 MG Caps capsule Commonly known as: FLOMAX Take 1 capsule (0.4 mg total) by mouth daily. Started by: Debroah Loop, PA-C   Toujeo Max SoloStar 300 UNIT/ML Solostar Pen Generic drug: insulin glargine (2 Unit Dial) Inject 42 Units into the skin every morning. Titrate up to 60 units/day, per endocrinology instructions.   vitamin C 1000 MG tablet Take 1,000 mg by mouth daily.   Vitamin D (Ergocalciferol) 1.25 MG (50000 UNIT) Caps capsule Commonly known as: DRISDOL Take 50,000 Units by mouth every Monday.   vitamin E 180 MG (400 UNITS) capsule Take 400 Units by mouth every 14 (fourteen) days.       Allergies:  Allergies  Allergen Reactions  . Other Hives    Blue cheese    Family History: Family History  Problem Relation Age of Onset  . Heart failure Father   . Heart attack Brother   . Kidney cancer Neg Hx   . Bladder Cancer Neg Hx   . Prostate cancer Neg Hx     Social History:   reports that he has never  smoked. He has never used smokeless tobacco. He reports current alcohol use. He reports that he does not use drugs.  Physical Exam: BP 117/68   Pulse 80   Ht 6' (1.829 m)   Wt 277 lb 6.4 oz (125.8 kg)   BMI 37.62 kg/m   Constitutional:  Alert and oriented, no acute distress, nontoxic appearing HEENT: [place], AT Cardiovascular: No clubbing, cyanosis, or edema Respiratory: Normal respiratory effort, no increased work of breathing Skin: No rashes, bruises or suspicious lesions Neurologic: Grossly intact, no focal deficits, moving all 4 extremities Psychiatric: Normal mood and affect  Laboratory Data: Results for orders placed or performed in visit on 10/19/19  Microscopic Examination   URINE  Result Value Ref Range   WBC, UA >30 (A) 0 - 5 /hpf   RBC 3-10 (A) 0 - 2 /hpf   Epithelial Cells (non renal) 0-10 0 - 10 /hpf   Renal Epithel, UA 0-10 (A) None seen /hpf   Crystals Present (A) N/A   Crystal Type Amorphous Sediment N/A   Bacteria, UA Few None seen/Few  Urinalysis, Complete  Result Value Ref Range   Specific Gravity, UA 1.015 1.005 - 1.030   pH, UA 7.0 5.0 - 7.5   Color, UA Yellow Yellow   Appearance Ur Cloudy (A) Clear   Leukocytes,UA 3+ (A) Negative   Protein,UA Negative Negative/Trace   Glucose, UA 2+ (A) Negative   Ketones, UA Negative Negative   RBC, UA 1+ (A) Negative   Bilirubin, UA Negative Negative   Urobilinogen, Ur 0.2 0.2 - 1.0 mg/dL   Nitrite, UA Negative Negative   Microscopic Examination See below:    Assessment & Plan:   1. Kidney stones 69 year old male with extensive history of recurrent nephrolithiasis presents for follow-up of acute stone episode/UTI.  Infective symptoms have resolved following culture appropriate antibiotics.  He continues to pass stones.  [name] well-appearing in clinic today, VSS, no indication for urgent intervention at this time.  UA today notable for pyuria and microscopic hematuria, unclear if this is representative of  persistent/recurrent infection versus continued stone episode.  Will send for culture for further evaluation.  Will refill Flomax and Percocet for management of ongoing stone episode with plans for follow-up with renal ultrasound prior in 1 month.  Counseled patient to contact our office immediately or proceed to the emergency room if he develops fever, chills, nausea, vomiting,  or uncontrollable flank pain.  He expressed understanding. - Urinalysis, Complete - CULTURE, URINE COMPREHENSIVE - tamsulosin (FLOMAX) 0.4 MG CAPS capsule; Take 1 capsule (0.4 mg total) by mouth daily.  Dispense: 30 capsule; Refill: 0 - oxyCODONE-acetaminophen (PERCOCET/ROXICET) 5-325 MG tablet; Take 1-2 tablets by mouth every 6 (six) hours as needed for up to 5 days for severe pain.  Dispense: 10 tablet; Refill: 0 - Microscopic Examination - US RENAL; Future   Return in about 4 weeks (around 11/16/2019) for Stone f/u with RUS prior.  Debroah Loop, PA-C  [facility] [address] [place], [place] [address] [phone]

## 2019-10-22 LAB — CULTURE, URINE COMPREHENSIVE

## 2019-10-23 ENCOUNTER — Telehealth: Payer: Self-pay | Admitting: Physician Assistant

## 2019-10-23 ENCOUNTER — Other Ambulatory Visit: Payer: Self-pay | Admitting: Physician Assistant

## 2019-10-23 DIAGNOSIS — R109 Unspecified abdominal pain: Secondary | ICD-10-CM

## 2019-10-23 MED ORDER — OXYBUTYNIN CHLORIDE 5 MG PO TABS
5.0000 mg | ORAL_TABLET | Freq: Three times a day (TID) | ORAL | 2 refills | Status: DC | PRN
Start: 2019-10-23 — End: 2019-12-14

## 2019-10-23 MED ORDER — CEPHALEXIN 500 MG PO CAPS: 500.0000 mg | ORAL_CAPSULE | Freq: Two times a day (BID) | ORAL | 0 refills | Status: AC

## 2019-10-23 MED ORDER — CEPHALEXIN 250 MG PO CAPS: 250.0000 mg | ORAL_CAPSULE | Freq: Every day | ORAL | 0 refills | Status: DC

## 2019-10-23 NOTE — Telephone Encounter (Signed)
Please contact the patient and inform him that the urine culture from his most recent visit with me came back positive.  I suspect his ongoing stone episode is contributing to his continued UTI.  I would like to start him on a treatment course of Keflex and then have him transition to suppressive Keflex until his next visit with me in clinic.    Based on these findings, I would also like to change my imaging plan for him. I have cancelled his scheduled renal ultrasound; please ask him to obtain a KUB just prior to his follow-up appointment with me instead.

## 2019-10-23 NOTE — Telephone Encounter (Signed)
Notified patient as advised. Patient agreed to pick up Rx of ABX. Also explained to patient that the RUS was canceled and he would need to visit the radiology desk before his appt on 11/24/2019. Patient verbalized understanding.

## 2019-11-03 HISTORY — PX: NEPHROSTOMY TUBE PLACEMENT (ARMC HX): HXRAD1726

## 2019-11-10 ENCOUNTER — Telehealth: Payer: Self-pay

## 2019-11-10 NOTE — Telephone Encounter (Signed)
Notified patient to go to the ED overnight if he sees an increase in fever and/or feels worse. Patient verbalized understanding. Offered patient 9 am appt, patient declined, wanted 11 AM appt instead.

## 2019-11-10 NOTE — Telephone Encounter (Signed)
Patient left voicemail stating he was running a fever with severe back pain. Attempted to return patients call to get further details. LMOM for patient to please call the office back.

## 2019-11-10 NOTE — Telephone Encounter (Signed)
Please call [name] back and advise him to go to the ED immediately.

## 2019-11-10 NOTE — Telephone Encounter (Signed)
Patient reports a fever that began last night and into today that was not relieved with Tylenol, severe flank/back pain, some nausea with a loss of appetite. Patient denies any vomiting or gross hematuria. Patient scheduled to be seen with Sam on 6/22 with a KUB prior. Patient would like to know if he should be seen sooner, needs more ABX, or anything else Sam would like for him to do. Please advise.

## 2019-11-11 ENCOUNTER — Encounter: Payer: Self-pay | Admitting: Urology

## 2019-11-11 ENCOUNTER — Ambulatory Visit
Admission: RE | Admit: 2019-11-11 | Discharge: 2019-11-11 | Disposition: A | Discharge status: Home / Self Care | Payer: Medicare Other | Source: Ambulatory Visit | Attending: Physician Assistant | Admitting: Physician Assistant

## 2019-11-11 ENCOUNTER — Other Ambulatory Visit: Payer: Self-pay

## 2019-11-11 ENCOUNTER — Ambulatory Visit (INDEPENDENT_AMBULATORY_CARE_PROVIDER_SITE_OTHER): Payer: Medicare Other | Admitting: Urology

## 2019-11-11 VITALS — BP 101/60 | HR 89 | Temp 100.7°F | Ht 72.0 in | Wt 277.0 lb

## 2019-11-11 DIAGNOSIS — R109 Unspecified abdominal pain: Secondary | ICD-10-CM | POA: Insufficient documentation

## 2019-11-11 DIAGNOSIS — N200 Calculus of kidney: Secondary | ICD-10-CM | POA: Diagnosis not present

## 2019-11-11 DIAGNOSIS — N1330 Unspecified hydronephrosis: Secondary | ICD-10-CM

## 2019-11-11 DIAGNOSIS — Z87442 Personal history of urinary calculi: Secondary | ICD-10-CM

## 2019-11-11 DIAGNOSIS — N136 Pyonephrosis: Secondary | ICD-10-CM | POA: Diagnosis not present

## 2019-11-11 LAB — BLADDER SCAN AMB NON-IMAGING

## 2019-11-11 IMAGING — CR DG ABDOMEN 1V
3 series · 3 of 3 positions shown · non-contrast
Comparison: [DATE] CT.  Plain film [DATE].

CLINICAL DATA: Flank pain. Nausea and decreased appetite for 2
days. Concern for kidney stones.

EXAM:
ABDOMEN - 1 VIEW

[abdomen kub (1 of 3)]
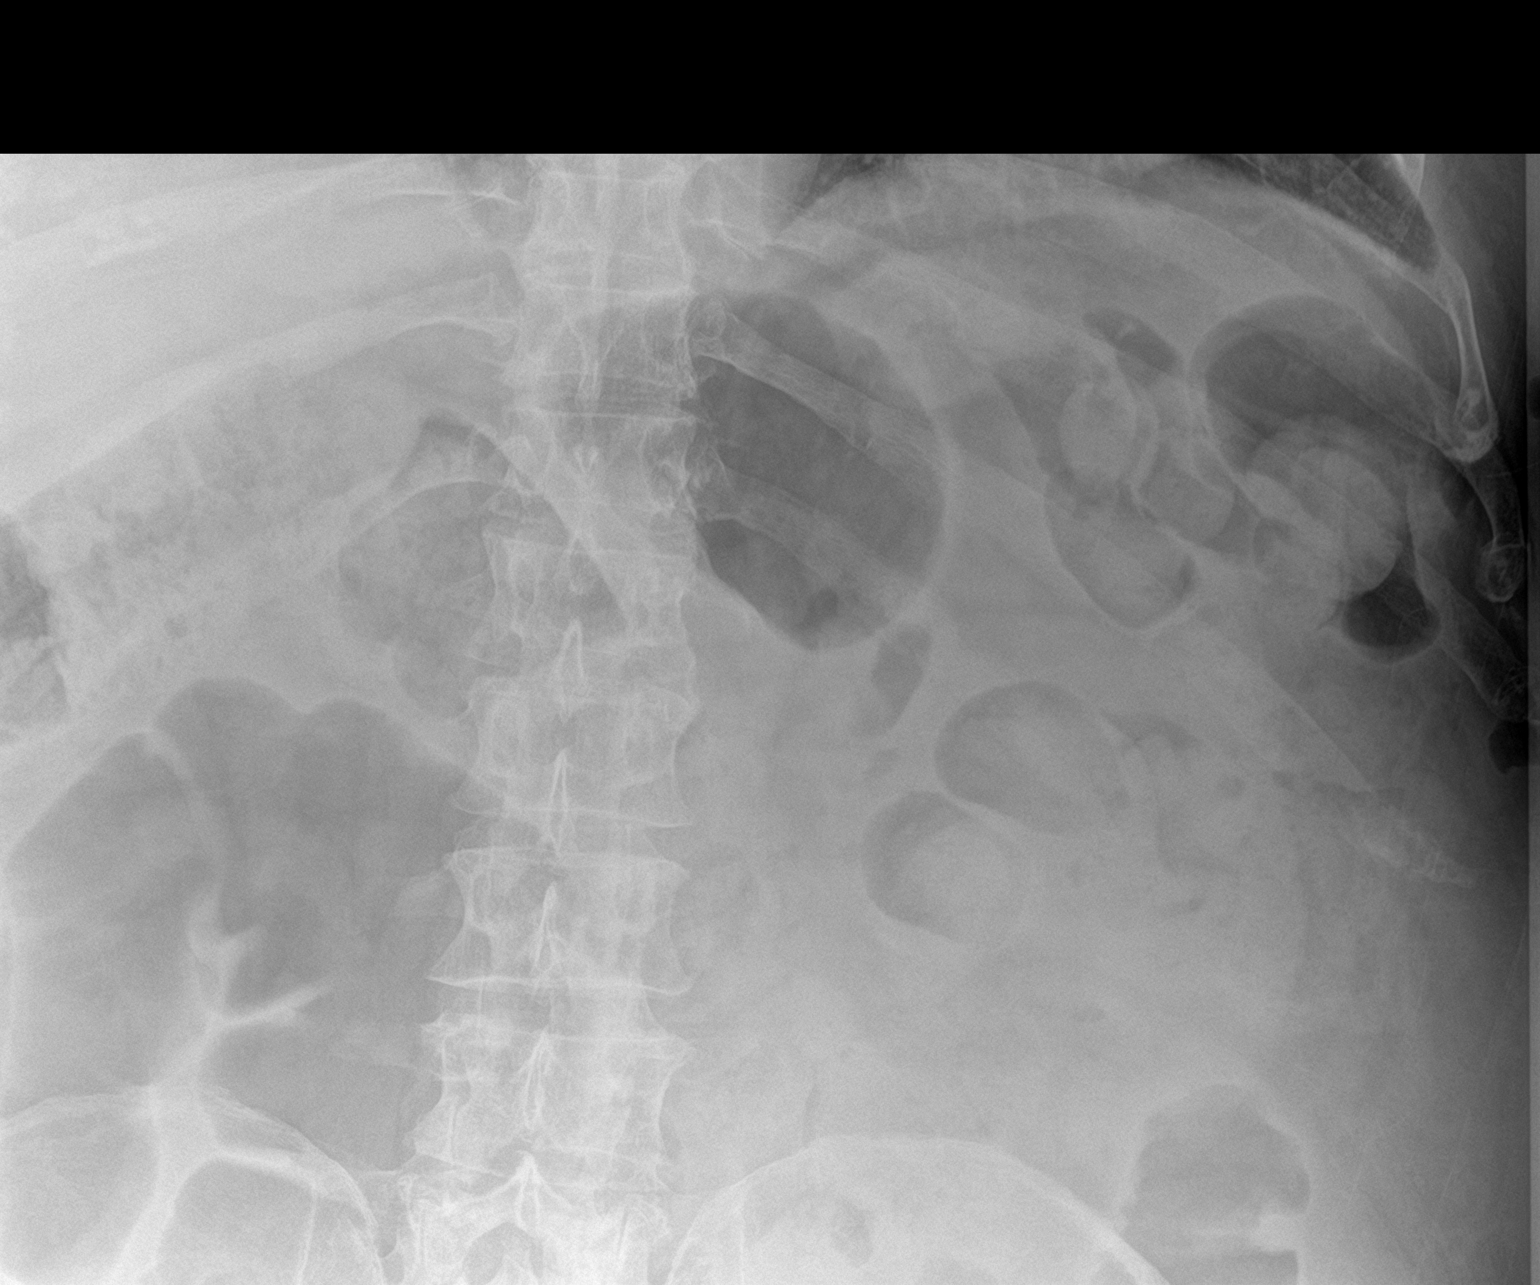

[abdomen kub (2 of 3)]
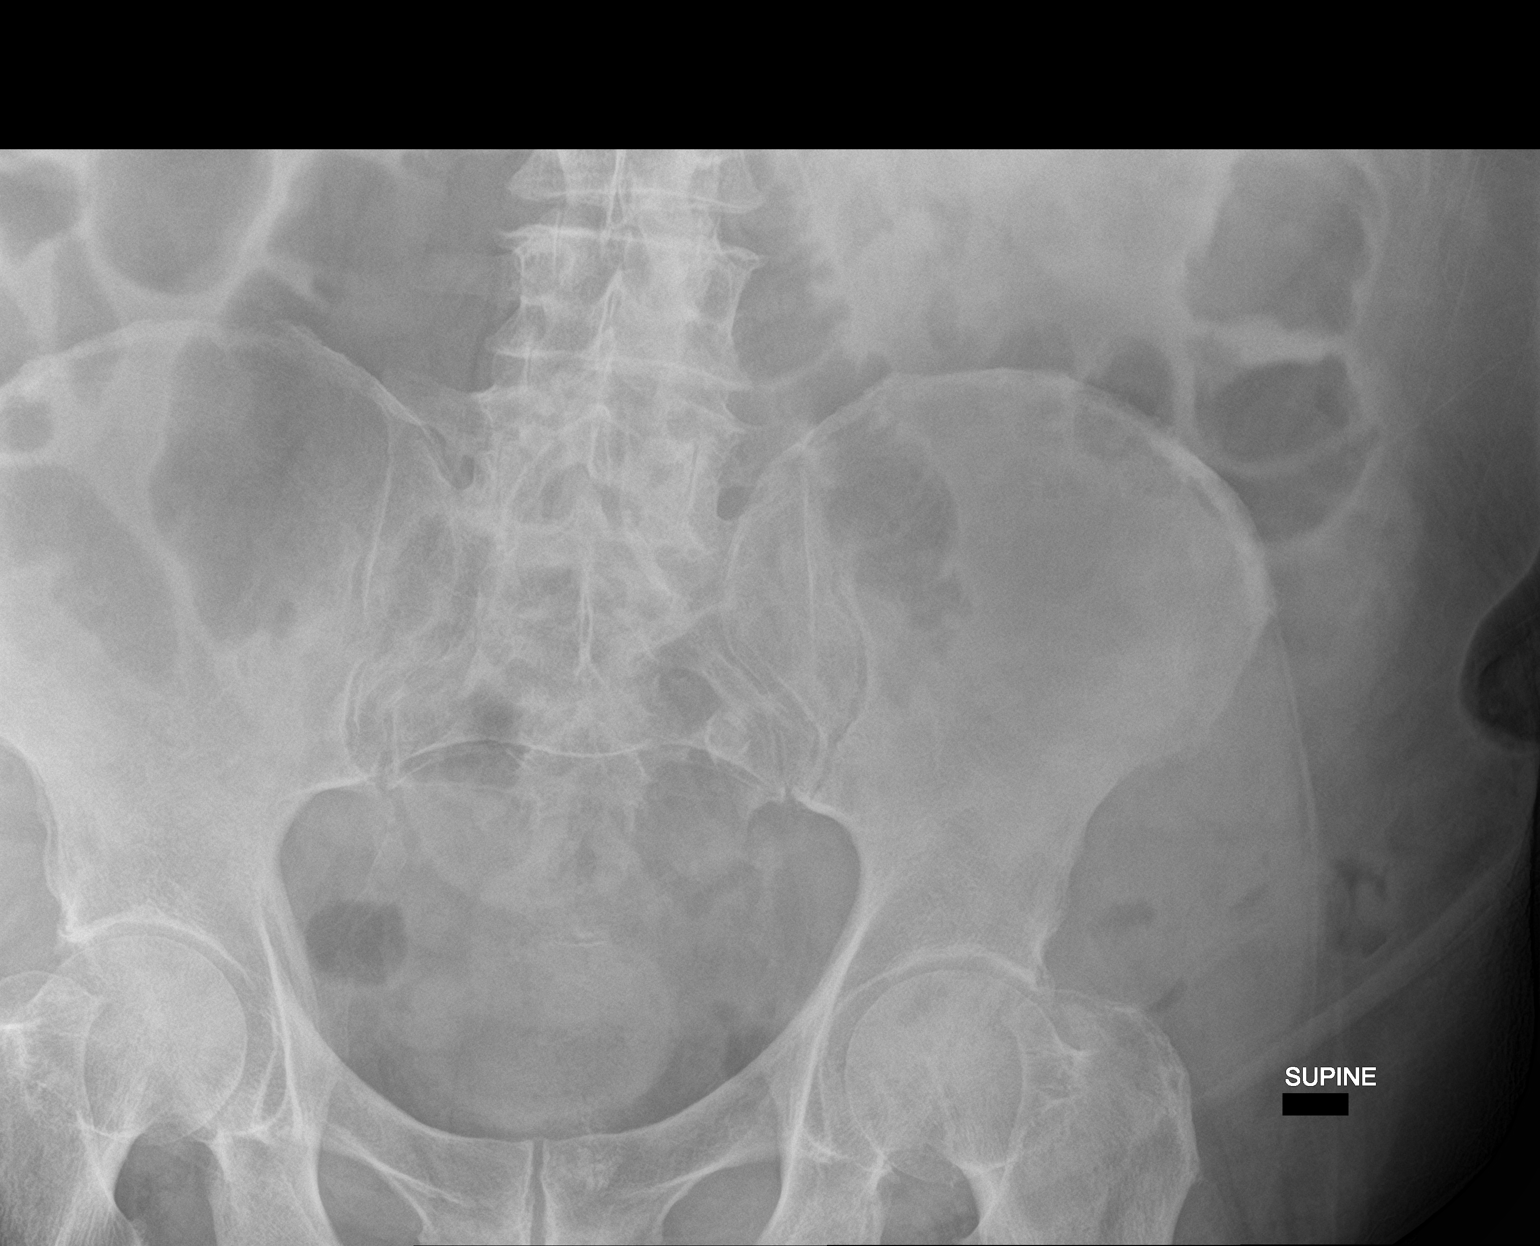

[abdomen kub (3 of 3)]
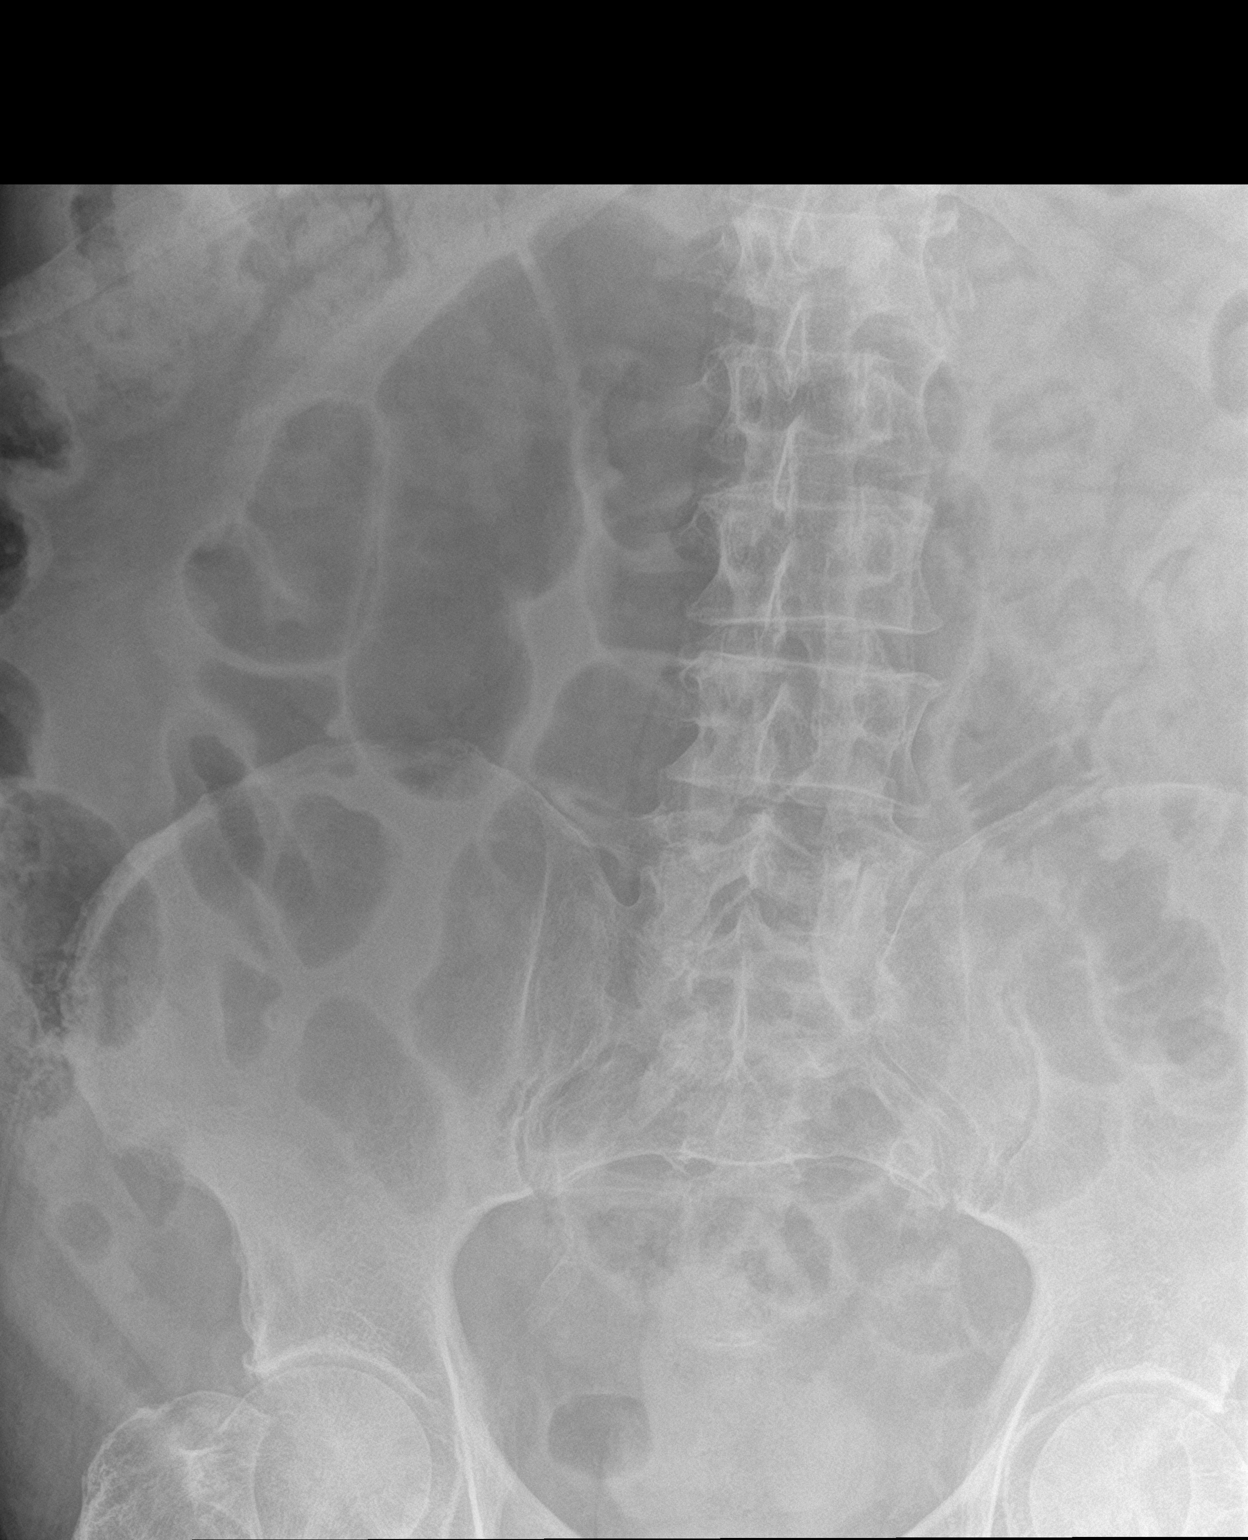

[3 of 3 positions shown; findings below may reference images not displayed]

FINDINGS: Nonobstructive bowel gas pattern. A large amount of colonic stool,
including projecting over the left kidney. The previously described
lower pole left renal collecting system calculus may be identified
on the second image, 4.2 cm.
IMPRESSION: Large colonic stool volume, suggesting constipation.

The previously described lower pole left renal collecting system
dominant stone is partially obscured, favored to be visualized.

## 2019-11-11 MED ORDER — CEFTRIAXONE SODIUM 1 G IJ SOLR
1.0000 g | Freq: Once | INTRAMUSCULAR | Status: AC
  Administered 2019-11-11: 1 g via INTRAMUSCULAR

## 2019-11-11 MED ORDER — HYDROCODONE-ACETAMINOPHEN 5-325 MG PO TABS: 1.0000 | ORAL_TABLET | Freq: Four times a day (QID) | ORAL | 0 refills | Status: DC | PRN

## 2019-11-11 MED ORDER — SULFAMETHOXAZOLE-TRIMETHOPRIM 800-160 MG PO TABS
1.0000 | ORAL_TABLET | Freq: Two times a day (BID) | ORAL | 0 refills | Status: DC
Start: 2019-11-11 — End: 2019-11-18

## 2019-11-11 NOTE — Progress Notes (Signed)
11/11/2019 4:04 PM   [name] 09-Jun-1950 [id number]  Referring provider: Vidal Schwalbe, MD [address] [place],  [place] [address]  Chief Complaint  Patient presents with  . Flank Pain    HPI: [name] is a 69 year old co-morbid male with a history of severe chronic left hydronephrosis, left renal atrophy, nephrolithiasis and CKD who presents today with complaints of fever, flank pain, nausea and anorexia.  He states it began 3 days ago with fever.  He states that he is also had a sense of malaise and lack of appetite.  He did not start taking his temperature until yesterday.  Last evening he had a fever of 102, this morning was 101 and currently in the office its a low-grade fever at 100.7.  His vital signs are stable.  [name] having left-sided flank pain.  9/10 pain.  Lasting for several hours at a time.   [name] also been experiencing some nausea.  Currently, he is experiencing frequency urgency dysuria difficulty urinating and constipation.  Patient denies any modifying or aggravating factors.  CATH UA yellow cloudy, 2+ glucose, 1+ ketone, 0-10 WBC's, granular casts and amorphous sediment is noted.  He has a longstanding history of chronic hydronephrosis on the left likely due to longstanding obstruction due to 2 ureteral stones.  He has had multiple procedures to treat his stone over the last few years.   PMH: Past Medical History:  Diagnosis Date  . Cardiomyopathy ([place])   . Chronic combined systolic (congestive) and diastolic (congestive) heart failure (HCC)    a. EF 25% by cath in 2013 b. Echo in 07/2015 showing EF of 15-20%, moderate MR, moderate Pulm HTN, severely dilated IVC  . CKD (chronic kidney disease) stage 3, GFR 30-59 ml/min   . Coronary artery disease   . Diabetes mellitus type 2, uncontrolled ([place])    a. A1c 11.0 in 07/2015.  [name] Hypertension   . Kidney stones    Left  . New onset atrial fibrillation ([place])    a. diagnosed in 07/2015 b. started on  Eliquis  . Paroxysmal atrial fibrillation (HCC)   . Pollen allergies 09-21-15   pt called and stated that he woke up and had some drainage-pt states he did not see the color of the drainage-pt denies running a fever and this only happened once-pt instructed to call Dr Audree Bane office if he starts running a fever or if the color of drainage becomes yellow/green  . Psoriasis   . Pulmonary hypertension ([place])   . Renal disorder    kidney stone  . Sleep apnea     Surgical History: Past Surgical History:  Procedure Laterality Date  . CARDIAC CATHETERIZATION N/A 07/25/2015   Procedure: Right/Left Heart Cath and Coronary Angiography;  Surgeon: Wellington Hampshire, MD;  Location: [facility];  Service: Cardiovascular;  Laterality: N/A;  . CARDIAC CATHETERIZATION     [place]  . CARDIAC CATHETERIZATION     [facility], [place]    . CYSTOSCOPY WITH STENT PLACEMENT Left 09/07/2015   Procedure: CYSTOSCOPY WITH STENT PLACEMENT;  Surgeon: Hollice Espy, MD;  Location: [facility];  Service: Urology;  Laterality: Left;  . CYSTOSCOPY/URETEROSCOPY/HOLMIUM LASER/STENT PLACEMENT Left 10/25/2015   Procedure: CYSTOSCOPY/URETEROSCOPY/HOLMIUM LASER/STENT EXCHANGE;  Surgeon: Hollice Espy, MD;  Location: [facility];  Service: Urology;  Laterality: Left;  . CYSTOSCOPY/URETEROSCOPY/HOLMIUM LASER/STENT PLACEMENT Left 05/11/2019   Procedure: CYSTOSCOPY/URETEROSCOPY/HOLMIUM LASER/STENT PLACEMENT;  Surgeon: Hollice Espy,  MD;  Location: [facility];  Service: Urology;  Laterality: Left;  . EXTRACORPOREAL SHOCK WAVE LITHOTRIPSY Left 04/02/2019   Procedure: EXTRACORPOREAL SHOCK WAVE LITHOTRIPSY (ESWL);  Surgeon: Hollice Espy, MD;  Location: [facility];  Service: Urology;  Laterality: Left;  . EXTRACORPOREAL SHOCK WAVE LITHOTRIPSY Left 03/05/2019   Procedure: EXTRACORPOREAL SHOCK WAVE LITHOTRIPSY (ESWL);  Surgeon: Abbie Sons, MD;  Location: [facility];  Service: Urology;   Laterality: Left;  . KIDNEY SURGERY    . NEPHROSTOMY TUBE PLACEMENT ([place] HX) Left   . URETEROSCOPY WITH HOLMIUM LASER LITHOTRIPSY Left 09/07/2015   Procedure: URETEROSCOPY WITH HOLMIUM LASER LITHOTRIPSY;  Surgeon: Hollice Espy, MD;  Location: [facility];  Service: Urology;  Laterality: Left;    Home Medications:  Allergies as of 11/11/2019      Reactions   Other Hives   Blue cheese      Medication List       Accurate as of November 11, 2019  4:04 PM. If you have any questions, ask your nurse or doctor.        amiodarone 200 MG tablet Commonly known as: PACERONE Take 1 tablet (200 mg total) by mouth daily. What changed: when to take this   apixaban 5 MG Tabs tablet Commonly known as: Eliquis Take 1 tablet (5 mg total) 2 (two) times daily by mouth.   b complex vitamins tablet Take 1 tablet by mouth every evening.   baclofen 10 MG tablet Commonly known as: LIORESAL Take 10 mg by mouth every evening.   carvedilol 6.25 MG tablet Commonly known as: COREG TAKE 1 TABLET BY MOUTH TWICE DAILY   cephALEXin 250 MG capsule Commonly known as: KEFLEX Take 1 capsule (250 mg total) by mouth daily.   clonazePAM 1 MG tablet Commonly known as: KLONOPIN Take 1 mg by mouth at bedtime.   CoQ10 400 MG Caps Take 400 mg by mouth 2 (two) times daily.   dextromethorphan-guaiFENesin 30-600 MG 12hr tablet Commonly known as: MUCINEX DM Take 1 tablet by mouth 2 (two) times daily.   docusate sodium 100 MG capsule Commonly known as: COLACE Take 1 capsule (100 mg total) by mouth 2 (two) times daily. What changed:   how much to take  when to take this  additional instructions   furosemide 80 MG tablet Commonly known as: LASIX Take 1 tablet (80 mg total) by mouth daily.   glipiZIDE 10 MG tablet Commonly known as: GLUCOTROL Take 10 mg by mouth 2 (two) times daily.   Hawthorne Berry 550 MG Caps Take 550 mg by mouth every evening.   HYDROcodone-acetaminophen 5-325 MG tablet Commonly  known as: NORCO/VICODIN Take 1 tablet by mouth every 6 (six) hours as needed for moderate pain. Started by: Zara Council, PA-C   Jardiance 10 MG Tabs tablet Generic drug: empagliflozin Take 10 mg by mouth every evening.   losartan 25 MG tablet Commonly known as: COZAAR Take 25 mg by mouth every evening.   magnesium oxide 400 MG tablet Commonly known as: MAG-OX Take 400 mg by mouth every morning.   montelukast 10 MG tablet Commonly known as: SINGULAIR Take 10 mg by mouth at bedtime.   oxybutynin 5 MG tablet Commonly known as: DITROPAN Take 1 tablet (5 mg total) by mouth every 8 (eight) hours as needed for bladder spasms.   Ozempic (1 MG/DOSE) 2 MG/1.5ML Sopn Generic drug: Semaglutide (1 MG/DOSE) Inject 1 mg into the skin every Monday. In the evening.   polyethylene glycol 17 g packet Commonly known  as: MIRALAX / GLYCOLAX Take 17 g by mouth daily as needed for mild constipation.   POTASSIMIN PO Take 1 tablet by mouth every evening.   PRESERVISION AREDS 2 PO Take 1 tablet by mouth 2 (two) times daily.   rosuvastatin 20 MG tablet Commonly known as: CRESTOR Take 20 mg by mouth every evening.   sulfamethoxazole-trimethoprim 800-160 MG tablet Commonly known as: BACTRIM DS Take 1 tablet by mouth every 12 (twelve) hours. Started by: Zara Council, PA-C   Systane 0.4-0.3 % Soln Generic drug: Polyethyl Glycol-Propyl Glycol Place 1 drop into both eyes 3 (three) times daily as needed (dry/irritated eyes.).   tamsulosin 0.4 MG Caps capsule Commonly known as: FLOMAX Take 1 capsule (0.4 mg total) by mouth daily.   Toujeo Max SoloStar 300 UNIT/ML Solostar Pen Generic drug: insulin glargine (2 Unit Dial) Inject 42 Units into the skin every morning. Titrate up to 60 units/day, per endocrinology instructions.   vitamin C 1000 MG tablet Take 1,000 mg by mouth daily.   Vitamin D (Ergocalciferol) 1.25 MG (50000 UNIT) Caps capsule Commonly known as: DRISDOL Take 50,000  Units by mouth every Monday.   vitamin E 180 MG (400 UNITS) capsule Take 400 Units by mouth every 14 (fourteen) days.       Allergies:  Allergies  Allergen Reactions  . Other Hives    Blue cheese    Family History: Family History  Problem Relation Age of Onset  . Heart failure Father   . Heart attack Brother   . Kidney cancer Neg Hx   . Bladder Cancer Neg Hx   . Prostate cancer Neg Hx     Social History:  reports that he has never smoked. He has never used smokeless tobacco. He reports current alcohol use. He reports that he does not use drugs.  ROS: Pertinent ROS in HPI  Physical Exam: BP 101/60 (BP Location: Left Arm, Patient Position: Sitting, Cuff Size: Normal)   Pulse 89   Temp (!) 100.7 F (38.2 C) (Oral)   Ht 6' (1.829 m)   Wt 277 lb (125.6 kg)   BMI 37.57 kg/m   Constitutional:  Well nourished. Alert and oriented, No acute distress. HEENT: [place] AT, mask in place.  Trachea midline. Cardiovascular: No clubbing, cyanosis, or edema. Respiratory: Normal respiratory effort, no increased work of breathing. GI: Abdomen is obese, soft, non tender, non distended, no abdominal masses.  GU: No CVA tenderness.  No bladder fullness or masses.  Patient with buried phallus. Foreskin easily retracted  Urethral meatus is patent.  No penile discharge. No penile lesions or rashes. Scrotum without lesions, cysts, rashes and/or edema.   Neurologic: Grossly intact, no focal deficits, moving all 4 extremities. Psychiatric: Normal mood and affect.  Laboratory Data: Lab Results  Component Value Date   WBC 8.3 04/10/2019   HGB 14.1 04/10/2019   HCT 42.2 04/10/2019   MCV 97.2 04/10/2019   PLT 202 04/10/2019    Lab Results  Component Value Date   CREATININE 1.58 (H) 04/10/2019     Lab Results  Component Value Date   HGBA1C 11.0 (H) 07/08/2015    Lab Results  Component Value Date   TSH 1.830 12/12/2017       Component Value Date/Time   CHOL 153 07/09/2015 0455    HDL 34 (L) 07/09/2015 0455   CHOLHDL 4.5 07/09/2015 0455   VLDL 34 07/09/2015 0455   LDLCALC 85 07/09/2015 0455    Lab Results  Component Value Date  AST 21 04/10/2019   Lab Results  Component Value Date   ALT 26 04/10/2019    Urinalysis Component     Latest Ref Rng & Units 11/11/2019  Specific Gravity, UA     1.005 - 1.030 1.015  pH, UA     5.0 - 7.5 5.0  Color, UA     Yellow Yellow  Appearance Ur     Clear Cloudy (A)  Leukocytes,UA     Negative Trace (A)  Protein,UA     Negative/Trace Trace (A)  Glucose, UA     Negative 2+ (A)  Ketones, UA     Negative 1+ (A)  RBC, UA     Negative Negative  Bilirubin, UA     Negative Negative  Urobilinogen, Ur     0.2 - 1.0 mg/dL 0.2  Nitrite, UA     Negative Negative  Microscopic Examination      See below:   Component     Latest Ref Rng & Units 11/11/2019  WBC, UA     0 - 5 /hpf 6-10 (A)  RBC     0 - 2 /hpf 0-2  Epithelial Cells (non renal)     0 - 10 /hpf 0-10  Casts     None seen /lpf Present (A)  Cast Type     N/A Granular casts (A)  Crystals     N/A Present (A)  Crystal Type     N/A Amorphous Sediment  Bacteria, UA     None seen/Few Few    I have reviewed the labs.  In and Out Catheterization Patient is present today for a I & O catheterization due to inability to provide a specimen for UA and culture. Patient was cleaned and prepped in a sterile fashion with betadine . A 14 FR cath was inserted no complications were noted , 50 ml of urine return was noted, urine was dark yellow in color. A clean urine sample was collected for UA and urine culture. Bladder was drained  And catheter was removed with out difficulty.    Performed by: Zara Council, PA-C   Pertinent Imaging: Results for Jim, Love (MRN [id number]) as of 11/11/2019 13:11  Ref. Range 11/11/2019 12:19  Scan Result Unknown 22mL   I have independently reviewed the films.    Assessment & Plan:   1. Flank pain with history of  urolithiasis - Urinalysis, Complete - CULTURE, URINE COMPREHENSIVE - Bladder Scan (Post Void Residual) in office - 1 gram of Rocephin given in the office -Sent home on Bactrim DS twice daily, will adjust to a culture appropriate antibiotic if necessary After a lengthy discussion between Dr. Erlene Quan, me and the patient, it is decided that we continue to manage him conservatively due to his comorbidities as well as the chronic nature of his left-sided hydronephrosis and left-sided nephrolithiasis.  It is felt that a CT scan at this time would not aid in the clinical decision making and we will postpone the scan in order to treat his current infection, so that we can achieve a clear picture of his stone burden in the future.  As his flank pain and infections are occurring on a more frequent basis, a nephrectomy would be a consideration.  [name] in agreement to go forward with a conservative approach at this time, getting the infection under control and returning in 2 to 3 weeks with CT scan/office visit with Dr. Erlene Quan to discuss his options.  In the interim, he is  advised to watch for signs of sepsis (mental confusion, rigors, high fevers, pain not able to be controlled with pain medication, nausea/vomiting) and seek treatment in the emergency department as he will likely need emergent stent placement at that time.  He does have a roommate and he will also advise him to watch for signs as well.  2. Chronic hydronephrosis - severe - see previous notes  3.  Nephrolithiasis -Status post multiple procedures - see previous notes   Return for 06/25 with Dr. Erlene Quan in [facility] wtih CT just prior to the appointment .  These notes generated with voice recognition software. I apologize for typographical errors.  Zara Council, PA-C  [facility] [address]  [place], [place] [address] [phone]  I spent 39 minutes on the day of the encounter to include  pre-visit record review, face-to-face time with the patient, and post-visit ordering of tests.

## 2019-11-12 ENCOUNTER — Other Ambulatory Visit: Payer: Self-pay

## 2019-11-12 ENCOUNTER — Emergency Department: Payer: Medicare Other

## 2019-11-12 ENCOUNTER — Inpatient Hospital Stay
Admission: AD | Admit: 2019-11-12 | Discharge: 2019-11-18 | DRG: 690 | Disposition: A | Discharge status: Home Health | Payer: Medicare Other | Attending: Internal Medicine | Admitting: Internal Medicine

## 2019-11-12 DIAGNOSIS — N261 Atrophy of kidney (terminal): Secondary | ICD-10-CM | POA: Diagnosis present

## 2019-11-12 DIAGNOSIS — E1159 Type 2 diabetes mellitus with other circulatory complications: Secondary | ICD-10-CM | POA: Diagnosis present

## 2019-11-12 DIAGNOSIS — I429 Cardiomyopathy, unspecified: Secondary | ICD-10-CM | POA: Diagnosis present

## 2019-11-12 DIAGNOSIS — I2510 Atherosclerotic heart disease of native coronary artery without angina pectoris: Secondary | ICD-10-CM | POA: Diagnosis present

## 2019-11-12 DIAGNOSIS — Z936 Other artificial openings of urinary tract status: Secondary | ICD-10-CM

## 2019-11-12 DIAGNOSIS — E1142 Type 2 diabetes mellitus with diabetic polyneuropathy: Secondary | ICD-10-CM | POA: Diagnosis present

## 2019-11-12 DIAGNOSIS — E785 Hyperlipidemia, unspecified: Secondary | ICD-10-CM | POA: Diagnosis present

## 2019-11-12 DIAGNOSIS — E1122 Type 2 diabetes mellitus with diabetic chronic kidney disease: Secondary | ICD-10-CM | POA: Diagnosis present

## 2019-11-12 DIAGNOSIS — N1330 Unspecified hydronephrosis: Secondary | ICD-10-CM

## 2019-11-12 DIAGNOSIS — E1169 Type 2 diabetes mellitus with other specified complication: Secondary | ICD-10-CM | POA: Diagnosis present

## 2019-11-12 DIAGNOSIS — N136 Pyonephrosis: Principal | ICD-10-CM | POA: Diagnosis present

## 2019-11-12 DIAGNOSIS — N132 Hydronephrosis with renal and ureteral calculous obstruction: Secondary | ICD-10-CM | POA: Diagnosis present

## 2019-11-12 DIAGNOSIS — N200 Calculus of kidney: Secondary | ICD-10-CM | POA: Diagnosis present

## 2019-11-12 DIAGNOSIS — I5032 Chronic diastolic (congestive) heart failure: Secondary | ICD-10-CM | POA: Diagnosis not present

## 2019-11-12 DIAGNOSIS — I480 Paroxysmal atrial fibrillation: Secondary | ICD-10-CM | POA: Diagnosis not present

## 2019-11-12 DIAGNOSIS — E871 Hypo-osmolality and hyponatremia: Secondary | ICD-10-CM | POA: Diagnosis present

## 2019-11-12 DIAGNOSIS — I272 Pulmonary hypertension, unspecified: Secondary | ICD-10-CM | POA: Diagnosis present

## 2019-11-12 DIAGNOSIS — E669 Obesity, unspecified: Secondary | ICD-10-CM | POA: Diagnosis present

## 2019-11-12 DIAGNOSIS — Z20822 Contact with and (suspected) exposure to covid-19: Secondary | ICD-10-CM | POA: Diagnosis present

## 2019-11-12 DIAGNOSIS — Z794 Long term (current) use of insulin: Secondary | ICD-10-CM

## 2019-11-12 DIAGNOSIS — N189 Chronic kidney disease, unspecified: Secondary | ICD-10-CM | POA: Diagnosis not present

## 2019-11-12 DIAGNOSIS — Z6838 Body mass index (BMI) 38.0-38.9, adult: Secondary | ICD-10-CM

## 2019-11-12 DIAGNOSIS — N202 Calculus of kidney with calculus of ureter: Secondary | ICD-10-CM | POA: Diagnosis present

## 2019-11-12 DIAGNOSIS — I959 Hypotension, unspecified: Secondary | ICD-10-CM | POA: Diagnosis present

## 2019-11-12 DIAGNOSIS — N179 Acute kidney failure, unspecified: Secondary | ICD-10-CM | POA: Diagnosis present

## 2019-11-12 DIAGNOSIS — Z87440 Personal history of urinary (tract) infections: Secondary | ICD-10-CM

## 2019-11-12 DIAGNOSIS — N1832 Chronic kidney disease, stage 3b: Secondary | ICD-10-CM | POA: Diagnosis present

## 2019-11-12 DIAGNOSIS — Z8249 Family history of ischemic heart disease and other diseases of the circulatory system: Secondary | ICD-10-CM

## 2019-11-12 DIAGNOSIS — N10 Acute tubulo-interstitial nephritis: Secondary | ICD-10-CM | POA: Diagnosis present

## 2019-11-12 DIAGNOSIS — I428 Other cardiomyopathies: Secondary | ICD-10-CM | POA: Diagnosis not present

## 2019-11-12 DIAGNOSIS — I5022 Chronic systolic (congestive) heart failure: Secondary | ICD-10-CM | POA: Diagnosis not present

## 2019-11-12 DIAGNOSIS — I130 Hypertensive heart and chronic kidney disease with heart failure and stage 1 through stage 4 chronic kidney disease, or unspecified chronic kidney disease: Secondary | ICD-10-CM | POA: Diagnosis present

## 2019-11-12 DIAGNOSIS — G4733 Obstructive sleep apnea (adult) (pediatric): Secondary | ICD-10-CM | POA: Diagnosis present

## 2019-11-12 DIAGNOSIS — R109 Unspecified abdominal pain: Secondary | ICD-10-CM | POA: Diagnosis not present

## 2019-11-12 DIAGNOSIS — Z79899 Other long term (current) drug therapy: Secondary | ICD-10-CM

## 2019-11-12 DIAGNOSIS — Y92009 Unspecified place in unspecified non-institutional (private) residence as the place of occurrence of the external cause: Secondary | ICD-10-CM

## 2019-11-12 DIAGNOSIS — I4891 Unspecified atrial fibrillation: Secondary | ICD-10-CM | POA: Diagnosis not present

## 2019-11-12 DIAGNOSIS — R531 Weakness: Secondary | ICD-10-CM | POA: Diagnosis present

## 2019-11-12 DIAGNOSIS — Z87442 Personal history of urinary calculi: Secondary | ICD-10-CM

## 2019-11-12 DIAGNOSIS — Z7901 Long term (current) use of anticoagulants: Secondary | ICD-10-CM

## 2019-11-12 DIAGNOSIS — D649 Anemia, unspecified: Secondary | ICD-10-CM | POA: Diagnosis not present

## 2019-11-12 DIAGNOSIS — W19XXXA Unspecified fall, initial encounter: Secondary | ICD-10-CM | POA: Diagnosis present

## 2019-11-12 DIAGNOSIS — D631 Anemia in chronic kidney disease: Secondary | ICD-10-CM | POA: Diagnosis present

## 2019-11-12 DIAGNOSIS — R509 Fever, unspecified: Secondary | ICD-10-CM | POA: Diagnosis not present

## 2019-11-12 DIAGNOSIS — Z955 Presence of coronary angioplasty implant and graft: Secondary | ICD-10-CM

## 2019-11-12 DIAGNOSIS — N201 Calculus of ureter: Secondary | ICD-10-CM | POA: Diagnosis not present

## 2019-11-12 DIAGNOSIS — N183 Chronic kidney disease, stage 3 unspecified: Secondary | ICD-10-CM

## 2019-11-12 DIAGNOSIS — N12 Tubulo-interstitial nephritis, not specified as acute or chronic: Secondary | ICD-10-CM

## 2019-11-12 DIAGNOSIS — Z596 Low income: Secondary | ICD-10-CM

## 2019-11-12 LAB — HIV ANTIBODY (ROUTINE TESTING W REFLEX): HIV Screen 4th Generation wRfx: NONREACTIVE

## 2019-11-12 LAB — URINALYSIS, COMPLETE
Bilirubin, UA: NEGATIVE
Nitrite, UA: NEGATIVE
RBC, UA: NEGATIVE
Specific Gravity, UA: 1.015 (ref 1.005–1.030)
Urobilinogen, Ur: 0.2 mg/dL (ref 0.2–1.0)
pH, UA: 5 (ref 5.0–7.5)

## 2019-11-12 LAB — BASIC METABOLIC PANEL
Anion gap: 13 (ref 5–15)
BUN: 47 mg/dL — ABNORMAL HIGH (ref 8–23)
CO2: 24 mmol/L (ref 22–32)
Calcium: 8.3 mg/dL — ABNORMAL LOW (ref 8.9–10.3)
Chloride: 94 mmol/L — ABNORMAL LOW (ref 98–111)
Creatinine, Ser: 2.87 mg/dL — ABNORMAL HIGH (ref 0.61–1.24)
GFR calc Af Amer: 25 mL/min — ABNORMAL LOW (ref >60)
GFR calc non Af Amer: 22 mL/min — ABNORMAL LOW (ref >60)
Glucose, Bld: 170 mg/dL — ABNORMAL HIGH (ref 70–99)
Potassium: 4.1 mmol/L (ref 3.5–5.1)
Sodium: 131 mmol/L — ABNORMAL LOW (ref 135–145)

## 2019-11-12 LAB — CBC
HCT: 33.3 % — ABNORMAL LOW (ref 39.0–52.0)
Hemoglobin: 11 g/dL — ABNORMAL LOW (ref 13.0–17.0)
MCH: 29.6 pg (ref 26.0–34.0)
MCHC: 33 g/dL (ref 30.0–36.0)
MCV: 89.5 fL (ref 80.0–100.0)
Platelets: 191 10*3/uL (ref 150–400)
RBC: 3.72 MIL/uL — ABNORMAL LOW (ref 4.22–5.81)
RDW: 16.4 % — ABNORMAL HIGH (ref 11.5–15.5)
WBC: 10.9 10*3/uL — ABNORMAL HIGH (ref 4.0–10.5)
nRBC: 0 % (ref 0.0–0.2)

## 2019-11-12 LAB — MICROSCOPIC EXAMINATION

## 2019-11-12 LAB — GLUCOSE, CAPILLARY: Glucose-Capillary: 247 mg/dL — ABNORMAL HIGH (ref 70–99)

## 2019-11-12 LAB — SARS CORONAVIRUS 2 BY RT PCR (HOSPITAL ORDER, PERFORMED IN ~~LOC~~ HOSPITAL LAB): SARS Coronavirus 2: NEGATIVE

## 2019-11-12 IMAGING — CT CT RENAL STONE PROTOCOL
2 of 4 series · 16 of 46 positions shown, 18 images · non-contrast
Comparison: [DATE]

CLINICAL DATA: Flank pain and fevers

EXAM:
CT ABDOMEN AND PELVIS WITHOUT CONTRAST
TECHNIQUE: Multidetector CT imaging of the abdomen and pelvis was performed
following the standard protocol without IV contrast.

[Series 4: stone full standard · axial · 0.98mm/px · z∈[-1166,-686]mm · 13 of 106 slices shown, 15 images]
[im 5/106  soft-tissue]
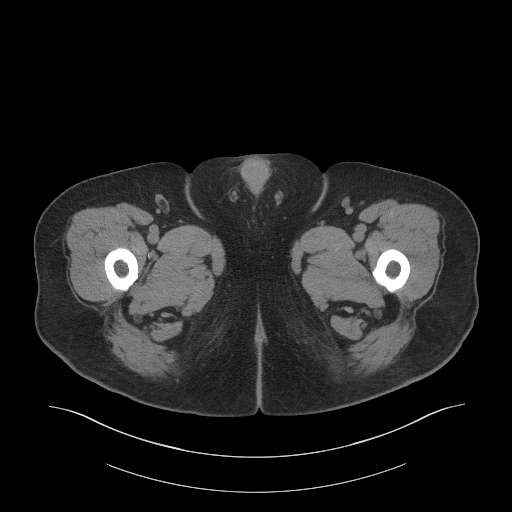
[im 5/106  bone]
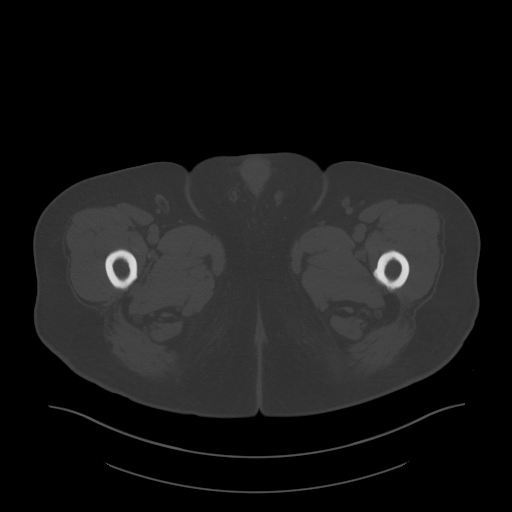
[im 14/106  soft-tissue]
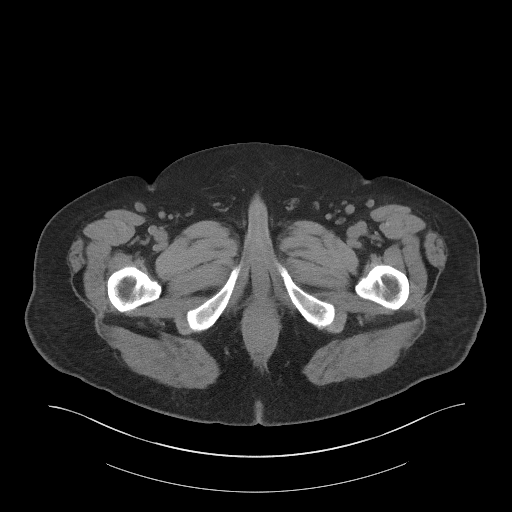
[im 22/106  soft-tissue]
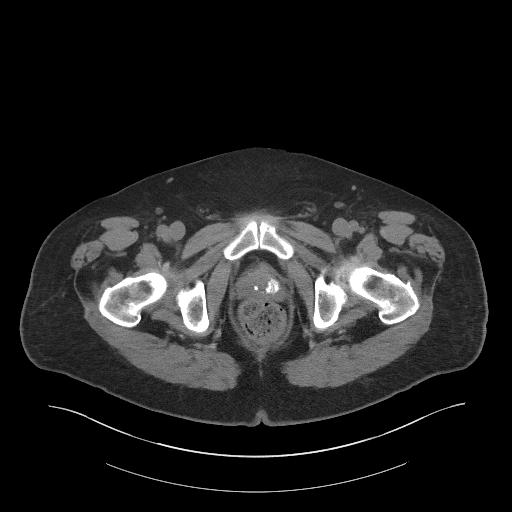
[im 31/106  soft-tissue]
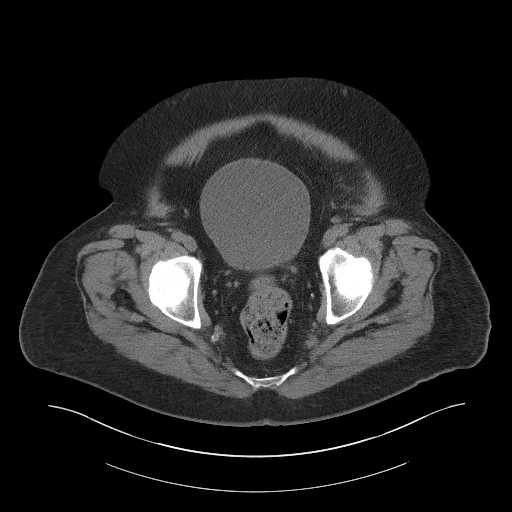
[im 36/106  soft-tissue]
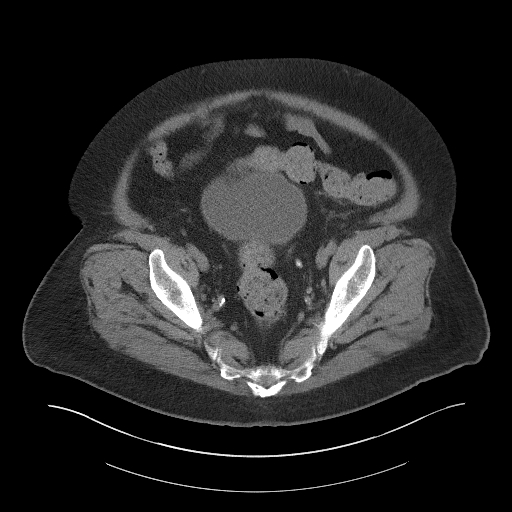
[im 44/106  soft-tissue]
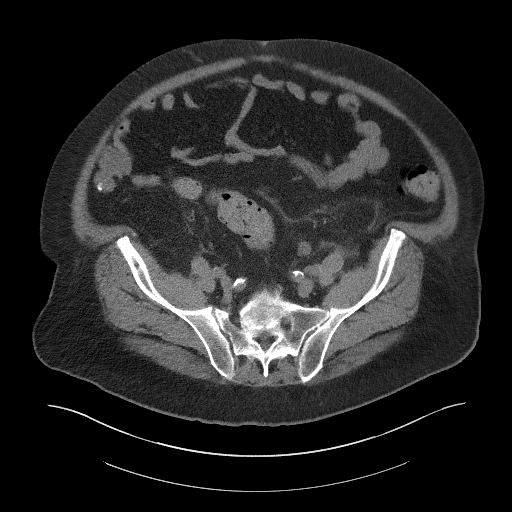
[im 53/106  soft-tissue]
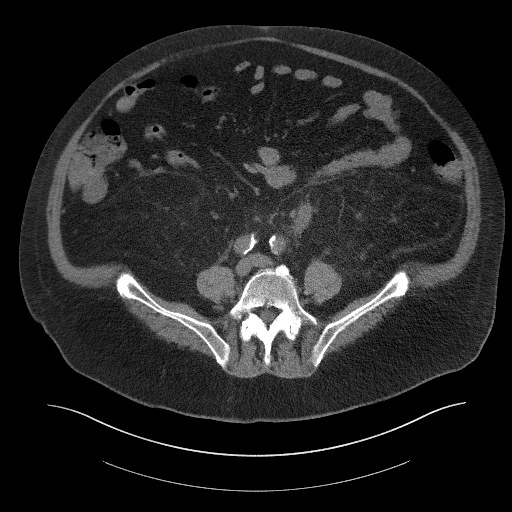
[im 62/106  soft-tissue]
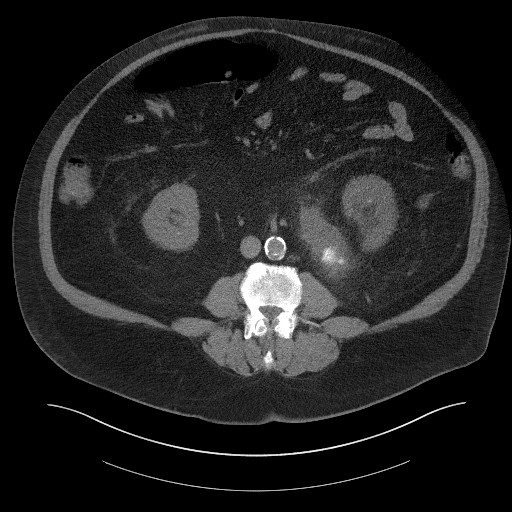
[im 71/106  soft-tissue]
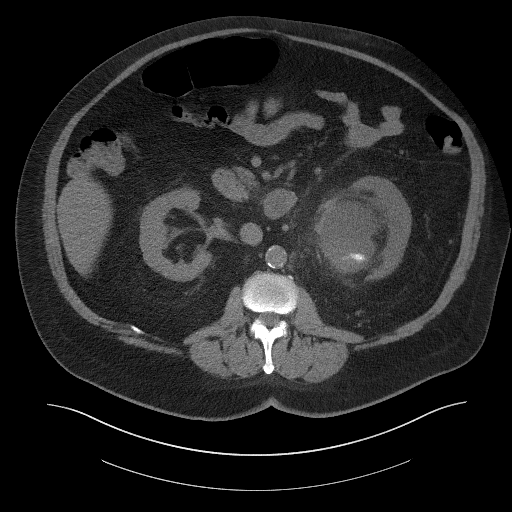
[im 71/106  bone]
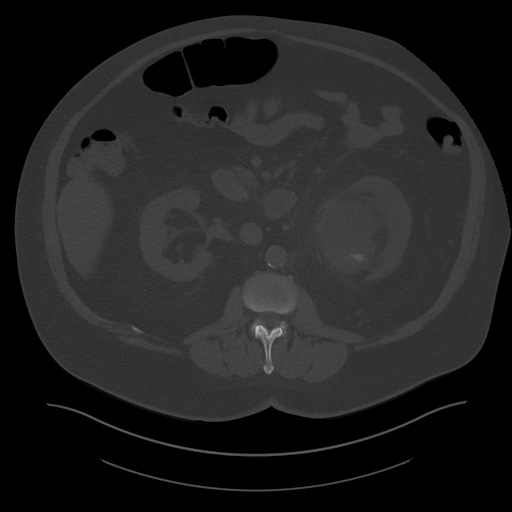
[im 75/106  soft-tissue]
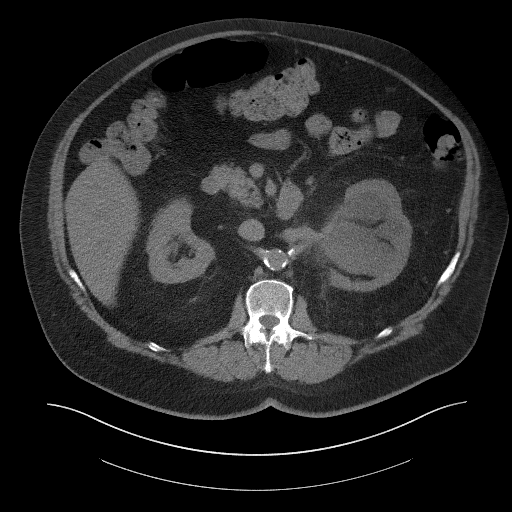
[im 84/106  soft-tissue]
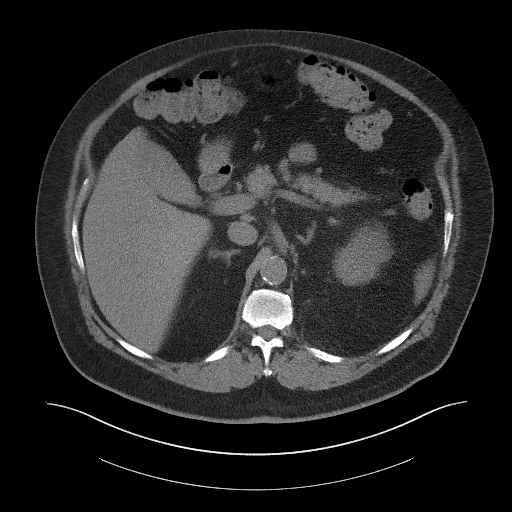
[im 92/106  soft-tissue]
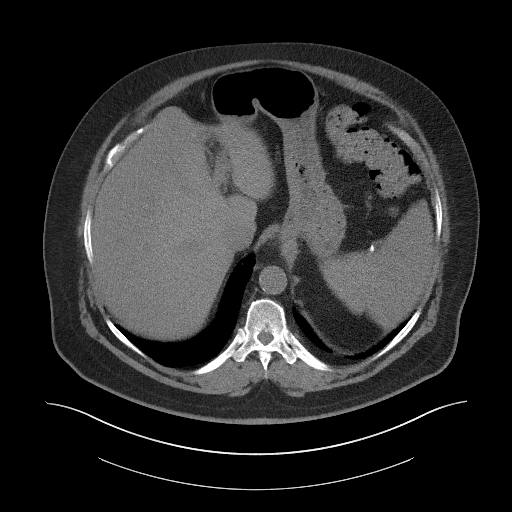
[im 101/106  soft-tissue]
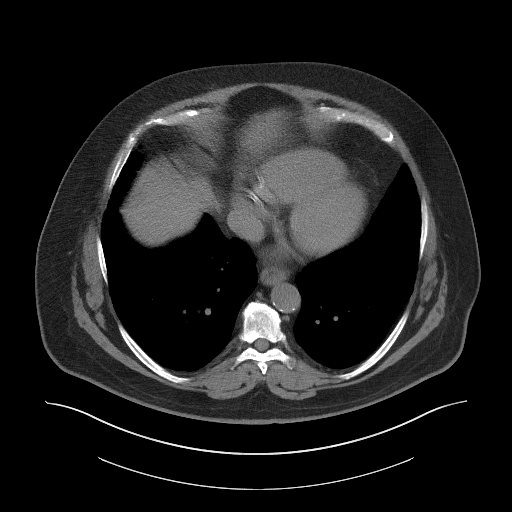

[Series 5: coronal · coronal · 0.95mm/px · 3 of 203 slices shown]
[im 68/203  soft-tissue]
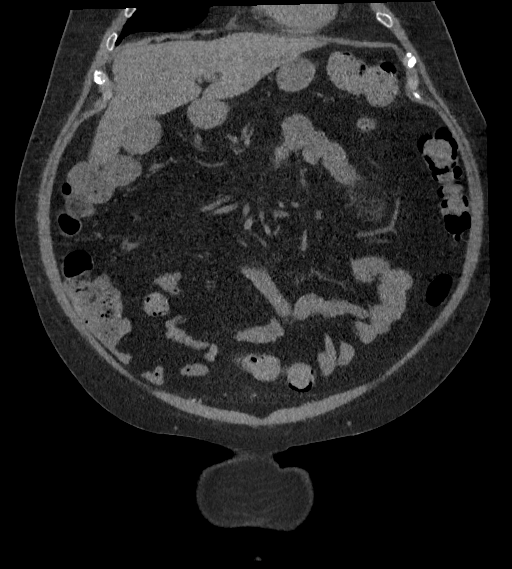
[im 90/203  soft-tissue]
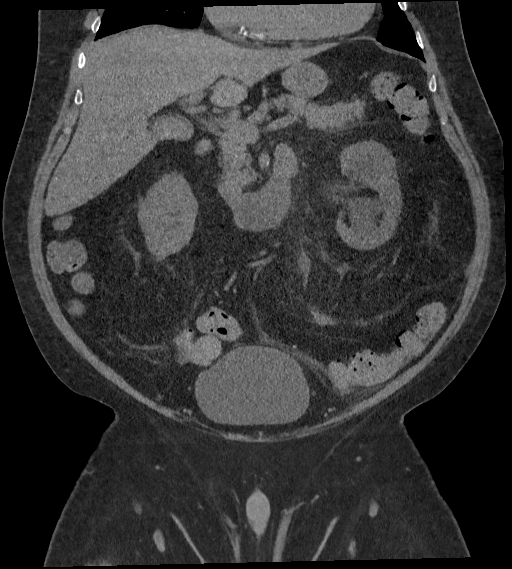
[im 113/203  soft-tissue]
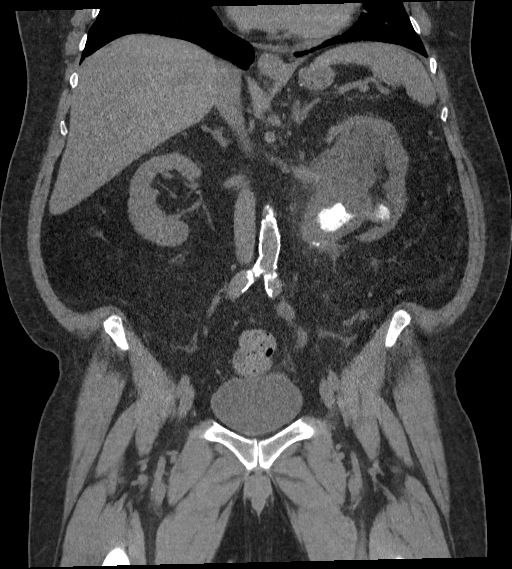

[16 of 46 positions shown; findings below may reference images not displayed]

FINDINGS: Lower chest: No acute abnormality.

Hepatobiliary: No focal liver abnormality is seen. No gallstones,
gallbladder wall thickening, or biliary dilatation.

Pancreas: Unremarkable. No pancreatic ductal dilatation or
surrounding inflammatory changes.

Spleen: Normal in size without focal abnormality.

Adrenals/Urinary Tract: Adrenal glands are within normal limits.
Right kidney is unremarkable without obstructive change. Left kidney
demonstrates hydronephrosis increased from the prior exam with
evidence of hydroureter. In the distal left ureter above the
ureterovesical junction there is a 15 mm stone identified causing
the obstructive change. Multiple calculi are noted within the left
kidney the largest of which measures 4.3 cm. Considerable cortical
thinning is noted bladder is partially distended.

Stomach/Bowel: Scattered diverticular change of the colon is noted
without evidence of diverticulitis. The appendix has been surgically
removed. Small bowel is unremarkable. No gastric abnormality is
seen.

Vascular/Lymphatic: Aortic atherosclerosis. No enlarged abdominal or
pelvic lymph nodes.

Reproductive: Prostate is unremarkable.

Other: No abdominal wall hernia or abnormality. No abdominopelvic
ascites.

Musculoskeletal: No acute or significant osseous findings.
IMPRESSION: Interval migration of a 15 mm calculus into the distal left ureter
with significant hydronephrosis identified. Perinephric stranding is
noted as well suspicious for underlying superimposed infection.

Multiple large calculi within the left kidney the largest of which
measures 4.3 cm in greatest dimension.

## 2019-11-12 MED ORDER — ONDANSETRON HCL 4 MG/2ML IJ SOLN: 4.0000 mg | Freq: Four times a day (QID) | INTRAMUSCULAR | Status: DC | PRN

## 2019-11-12 MED ORDER — MORPHINE SULFATE (PF) 2 MG/ML IV SOLN: 2.0000 mg | INTRAVENOUS | Status: DC | PRN

## 2019-11-12 MED ORDER — CARVEDILOL 6.25 MG PO TABS: 6.2500 mg | ORAL_TABLET | Freq: Two times a day (BID) | ORAL | Status: DC

## 2019-11-12 MED ORDER — BACLOFEN 10 MG PO TABS
10.0000 mg | ORAL_TABLET | Freq: Every evening | ORAL | Status: DC
  Administered 2019-11-12 – 2019-11-17 (×6): 10 mg via ORAL
  Filled 2019-11-12 (×8): qty 1

## 2019-11-12 MED ORDER — ONDANSETRON HCL 4 MG PO TABS: 4.0000 mg | ORAL_TABLET | Freq: Four times a day (QID) | ORAL | Status: DC | PRN

## 2019-11-12 MED ORDER — FUROSEMIDE 40 MG PO TABS: 80.0000 mg | ORAL_TABLET | Freq: Every day | ORAL | Status: DC

## 2019-11-12 MED ORDER — VITAMIN D (ERGOCALCIFEROL) 1.25 MG (50000 UNIT) PO CAPS
50000.0000 [IU] | ORAL_CAPSULE | ORAL | Status: DC
  Administered 2019-11-16: 50000 [IU] via ORAL
  Filled 2019-11-12: qty 1

## 2019-11-12 MED ORDER — RENA-VITE PO TABS
1.0000 | ORAL_TABLET | Freq: Every evening | ORAL | Status: DC
  Filled 2019-11-12 (×2): qty 1

## 2019-11-12 MED ORDER — VITAMIN E 180 MG (400 UNIT) PO CAPS
400.0000 [IU] | ORAL_CAPSULE | ORAL | Status: DC
  Administered 2019-11-12: 400 [IU] via ORAL
  Filled 2019-11-12: qty 1

## 2019-11-12 MED ORDER — TAMSULOSIN HCL 0.4 MG PO CAPS
0.4000 mg | ORAL_CAPSULE | Freq: Every day | ORAL | Status: DC
  Administered 2019-11-13 – 2019-11-18 (×6): 0.4 mg via ORAL
  Filled 2019-11-12 (×6): qty 1

## 2019-11-12 MED ORDER — OXYBUTYNIN CHLORIDE 5 MG PO TABS
5.0000 mg | ORAL_TABLET | Freq: Three times a day (TID) | ORAL | Status: DC | PRN
  Administered 2019-11-13 – 2019-11-14 (×3): 5 mg via ORAL
  Filled 2019-11-12 (×4): qty 1

## 2019-11-12 MED ORDER — CLONAZEPAM 1 MG PO TABS
1.0000 mg | ORAL_TABLET | Freq: Every day | ORAL | Status: DC
  Administered 2019-11-12 – 2019-11-17 (×6): 1 mg via ORAL
  Filled 2019-11-12 (×6): qty 1

## 2019-11-12 MED ORDER — ROSUVASTATIN CALCIUM 10 MG PO TABS
20.0000 mg | ORAL_TABLET | Freq: Every evening | ORAL | Status: DC
  Administered 2019-11-12 – 2019-11-17 (×6): 20 mg via ORAL
  Filled 2019-11-12: qty 2
  Filled 2019-11-12: qty 1
  Filled 2019-11-12: qty 2
  Filled 2019-11-12: qty 1
  Filled 2019-11-12: qty 2
  Filled 2019-11-12 (×3): qty 1
  Filled 2019-11-12: qty 2
  Filled 2019-11-12: qty 1
  Filled 2019-11-12 (×2): qty 2

## 2019-11-12 MED ORDER — INSULIN ASPART 100 UNIT/ML ~~LOC~~ SOLN
0.0000 [IU] | Freq: Three times a day (TID) | SUBCUTANEOUS | Status: DC
  Administered 2019-11-13 (×3): 4 [IU] via SUBCUTANEOUS
  Administered 2019-11-14 (×2): 3 [IU] via SUBCUTANEOUS
  Administered 2019-11-14: 4 [IU] via SUBCUTANEOUS
  Administered 2019-11-15 (×2): 3 [IU] via SUBCUTANEOUS
  Administered 2019-11-15 – 2019-11-16 (×3): 4 [IU] via SUBCUTANEOUS
  Administered 2019-11-16: 3 [IU] via SUBCUTANEOUS
  Administered 2019-11-17 (×3): 4 [IU] via SUBCUTANEOUS
  Administered 2019-11-18: 7 [IU] via SUBCUTANEOUS
  Filled 2019-11-12 (×17): qty 1

## 2019-11-12 MED ORDER — LOSARTAN POTASSIUM 50 MG PO TABS: 25.0000 mg | ORAL_TABLET | Freq: Every evening | ORAL | Status: DC

## 2019-11-12 MED ORDER — SODIUM CHLORIDE 0.9 % IV SOLN
1.0000 g | Freq: Once | INTRAVENOUS | Status: AC
  Administered 2019-11-12: 1 g via INTRAVENOUS
  Filled 2019-11-12: qty 10

## 2019-11-12 MED ORDER — POLYVINYL ALCOHOL 1.4 % OP SOLN
1.0000 [drp] | Freq: Three times a day (TID) | OPHTHALMIC | Status: DC | PRN
  Filled 2019-11-12: qty 15

## 2019-11-12 MED ORDER — AMIODARONE HCL 200 MG PO TABS
200.0000 mg | ORAL_TABLET | Freq: Every day | ORAL | Status: DC
  Administered 2019-11-12 – 2019-11-18 (×7): 200 mg via ORAL
  Filled 2019-11-12 (×7): qty 1

## 2019-11-12 MED ORDER — POLYETHYLENE GLYCOL 3350 17 G PO PACK: 17.0000 g | PACK | Freq: Every day | ORAL | Status: DC | PRN

## 2019-11-12 MED ORDER — OCUVITE-LUTEIN PO CAPS
1.0000 | ORAL_CAPSULE | Freq: Two times a day (BID) | ORAL | Status: DC
  Administered 2019-11-12 – 2019-11-18 (×12): 1 via ORAL
  Filled 2019-11-12 (×14): qty 1

## 2019-11-12 MED ORDER — SODIUM CHLORIDE 0.9 % IV SOLN
INTRAVENOUS | Status: DC
  Administered 2019-11-13: 100 mL/h via INTRAVENOUS

## 2019-11-12 MED ORDER — MONTELUKAST SODIUM 10 MG PO TABS
10.0000 mg | ORAL_TABLET | Freq: Every day | ORAL | Status: DC
  Administered 2019-11-12 – 2019-11-17 (×6): 10 mg via ORAL
  Filled 2019-11-12 (×6): qty 1

## 2019-11-12 MED ORDER — HYDROCODONE-ACETAMINOPHEN 5-325 MG PO TABS
1.0000 | ORAL_TABLET | Freq: Four times a day (QID) | ORAL | Status: DC | PRN
  Administered 2019-11-12 – 2019-11-17 (×11): 1 via ORAL
  Filled 2019-11-12 (×12): qty 1

## 2019-11-12 MED ORDER — ASCORBIC ACID 500 MG PO TABS
1000.0000 mg | ORAL_TABLET | Freq: Every day | ORAL | Status: DC
  Administered 2019-11-12 – 2019-11-18 (×7): 1000 mg via ORAL
  Filled 2019-11-12 (×7): qty 2

## 2019-11-12 NOTE — ED Provider Notes (Signed)
[facility] Emergency Department Provider Note   ____________________________________________   First MD Initiated Contact with Patient 11/12/19 1352     (approximate)  I have reviewed the triage vital signs and the nursing notes.   HISTORY  Chief Complaint Flank Pain and Fall   HPI [name] is a 69 y.o. male who was seen yesterday in Dr. Cherrie Gauze office.  He had a fever yesterday.  Fever was up to 102.  Dr. Erlene Quan, the urologist, reports when I talk to her that he has a known obstructed kidney.  She was planning on giving him p.o. antibiotics and probably putting in a nephrostomy tube later.  Patient takes Eliquis.  He did not take any yesterday morning he did take it last night and did not take it again this morning.  This morning he was feeding his dogs cookies and and planning on taking his medicine but he got weak and fell to the ground could not stand up.  He had to come into the hospital.  Here he has a fever of 199.  [name] not making much urine.  This is a known problem for him.  His BUN and creatinine have gone up since November his GFR has gone down since November of last year.  White count is only 10.9.  Patient only had 2 doses of Bactrim.  CT here shows obstructed kidney.  I discussed the patient with Dr. Erlene Quan as I noted we will get him into the hospital.  I have also talked to the urologist on-call Dr. Caprice Beaver.  He will assume management of the patient's urological condition and internal medicine will admit the patient.         Past Medical History:  Diagnosis Date  . Cardiomyopathy ([place])   . Chronic combined systolic (congestive) and diastolic (congestive) heart failure (HCC)    a. EF 25% by cath in 2013 b. Echo in 07/2015 showing EF of 15-20%, moderate MR, moderate Pulm HTN, severely dilated IVC  . CKD (chronic kidney disease) stage 3, GFR 30-59 ml/min   . Coronary artery disease   . Diabetes mellitus type 2, uncontrolled ([place])      a. A1c 11.0 in 07/2015.  [name] Hypertension   . Kidney stones    Left  . New onset atrial fibrillation ([place])    a. diagnosed in 07/2015 b. started on Eliquis  . Paroxysmal atrial fibrillation (HCC)   . Pollen allergies 09-21-15   pt called and stated that he woke up and had some drainage-pt states he did not see the color of the drainage-pt denies running a fever and this only happened once-pt instructed to call Dr Audree Bane office if he starts running a fever or if the color of drainage becomes yellow/green  . Psoriasis   . Pulmonary hypertension ([place])   . Renal disorder    kidney stone  . Sleep apnea     Patient Active Problem List   Diagnosis Date Noted  . Acute pyelonephritis 11/12/2019  . Chronic diastolic CHF (congestive heart failure) ([place]) 05/12/2018  . Chronic heart failure with preserved ejection fraction (HFpEF) ([place]) 12/05/2017  . NICM (nonischemic cardiomyopathy) ([place]) 04/18/2017  . Non-rheumatic mitral regurgitation 04/18/2017  . Chronic kidney disease, stage III (moderate) 04/18/2017  . Type 2 diabetes mellitus with diabetic polyneuropathy, without long-term current use of insulin ([place]) 02/27/2017  . Hyperlipidemia due to type 2 diabetes mellitus ([place]) 02/27/2017  . Left ureteral stone 10/25/2015  . Coronary artery disease involving  native coronary artery of native heart without angina pectoris 09/22/2015  . First degree AV block 09/22/2015  . Hypertension associated with diabetes ([place]) 09/22/2015  . Hyperlipidemia LDL goal <70 09/22/2015  . Hypotension 08/09/2015  . Hydronephrosis with renal and ureteral calculus obstruction   . Kidney stone on left side   . OSA (obstructive sleep apnea)   . Hyponatremia 07/13/2015  . Uncontrolled type 2 diabetes mellitus ([place]) 07/13/2015  . Pulmonary hypertension ([place])   . Cardiorenal syndrome   . Morbid obesity due to excess calories ([place])   . Paroxysmal atrial fibrillation [facility])     Past Surgical History:  Procedure  Laterality Date  . CARDIAC CATHETERIZATION N/A 07/25/2015   Procedure: Right/Left Heart Cath and Coronary Angiography;  Surgeon: Wellington Hampshire, MD;  Location: [facility];  Service: Cardiovascular;  Laterality: N/A;  . CARDIAC CATHETERIZATION     [place]  . CARDIAC CATHETERIZATION     [facility], [place]    . CYSTOSCOPY WITH STENT PLACEMENT Left 09/07/2015   Procedure: CYSTOSCOPY WITH STENT PLACEMENT;  Surgeon: Hollice Espy, MD;  Location: [facility];  Service: Urology;  Laterality: Left;  . CYSTOSCOPY/URETEROSCOPY/HOLMIUM LASER/STENT PLACEMENT Left 10/25/2015   Procedure: CYSTOSCOPY/URETEROSCOPY/HOLMIUM LASER/STENT EXCHANGE;  Surgeon: Hollice Espy, MD;  Location: [facility];  Service: Urology;  Laterality: Left;  . CYSTOSCOPY/URETEROSCOPY/HOLMIUM LASER/STENT PLACEMENT Left 05/11/2019   Procedure: CYSTOSCOPY/URETEROSCOPY/HOLMIUM LASER/STENT PLACEMENT;  Surgeon: Hollice Espy, MD;  Location: [facility];  Service: Urology;  Laterality: Left;  . EXTRACORPOREAL SHOCK WAVE LITHOTRIPSY Left 04/02/2019   Procedure: EXTRACORPOREAL SHOCK WAVE LITHOTRIPSY (ESWL);  Surgeon: Hollice Espy, MD;  Location: [facility];  Service: Urology;  Laterality: Left;  . EXTRACORPOREAL SHOCK WAVE LITHOTRIPSY Left 03/05/2019   Procedure: EXTRACORPOREAL SHOCK WAVE LITHOTRIPSY (ESWL);  Surgeon: Abbie Sons, MD;  Location: [facility];  Service: Urology;  Laterality: Left;  . KIDNEY SURGERY    . NEPHROSTOMY TUBE PLACEMENT ([place] HX) Left   . URETEROSCOPY WITH HOLMIUM LASER LITHOTRIPSY Left 09/07/2015   Procedure: URETEROSCOPY WITH HOLMIUM LASER LITHOTRIPSY;  Surgeon: Hollice Espy, MD;  Location: [facility];  Service: Urology;  Laterality: Left;    Prior to Admission medications   Medication Sig Start Date End Date Taking? Authorizing Provider  amiodarone (PACERONE) 200 MG tablet Take 1 tablet (200 mg total) by mouth daily. Patient taking differently: Take 200 mg by  mouth every morning.  08/01/15   Hower, Aaron Mose, MD  apixaban (ELIQUIS) 5 MG TABS tablet Take 1 tablet (5 mg total) 2 (two) times daily by mouth. 04/17/17   End, Harrell Gave, MD  Ascorbic Acid (VITAMIN C) 1000 MG tablet Take 1,000 mg by mouth daily.    [provider]  b complex vitamins tablet Take 1 tablet by mouth every evening.     [provider]  baclofen (LIORESAL) 10 MG tablet Take 10 mg by mouth every evening.     [provider]  carvedilol (COREG) 6.25 MG tablet TAKE 1 TABLET BY MOUTH TWICE DAILY Patient taking differently: Take 6.25 mg by mouth 2 (two) times daily.  10/26/16   End, Harrell Gave, MD  cephALEXin (KEFLEX) 250 MG capsule Take 1 capsule (250 mg total) by mouth daily. 10/23/19   Vaillancourt, Aldona Bar, PA-C  clonazePAM (KLONOPIN) 1 MG tablet Take 1 mg by mouth at bedtime.  01/23/17   [provider]  Coenzyme Q10 (COQ10) 400 MG CAPS Take 400 mg by mouth 2 (two) times daily.    [provider]  dextromethorphan-guaiFENesin (MUCINEX DM) 30-600 MG 12hr tablet Take 1 tablet by mouth 2 (two) times daily.    [provider]  docusate sodium (COLACE) 100 MG capsule Take 1 capsule (100 mg total) by mouth 2 (two) times daily. Patient taking differently: Take 100-200 mg by mouth See admin instructions. Take 1 capsule (100 mg) by mouth in the morning & take 2 capsules (200 mg) by mouth at night. 03/05/19   Hollice Espy, MD  furosemide (LASIX) 80 MG tablet Take 1 tablet (80 mg total) by mouth daily. 12/17/17   End, Harrell Gave, MD  glipiZIDE (GLUCOTROL) 10 MG tablet Take 10 mg by mouth 2 (two) times daily.     [provider]  Cathrine Muster 550 MG CAPS Take 550 mg by mouth every evening.     [provider]  HYDROcodone-acetaminophen (NORCO/VICODIN) 5-325 MG tablet Take 1 tablet by mouth every 6 (six) hours as needed for moderate pain. 11/11/19   Zara Council A, PA-C  Insulin Glargine, 2 Unit Dial, (TOUJEO MAX  SOLOSTAR) 300 UNIT/ML SOPN Inject 42 Units into the skin every morning. Titrate up to 60 units/day, per endocrinology instructions. 05/05/18   [provider]  JARDIANCE 10 MG TABS tablet Take 10 mg by mouth every evening.  04/28/18   [provider]  losartan (COZAAR) 25 MG tablet Take 25 mg by mouth every evening.  04/28/18   [provider]  magnesium oxide (MAG-OX) 400 MG tablet Take 400 mg by mouth every morning.     [provider]  montelukast (SINGULAIR) 10 MG tablet Take 10 mg by mouth at bedtime.  01/27/18   [provider]  Multiple Vitamins-Minerals (PRESERVISION AREDS 2 PO) Take 1 tablet by mouth 2 (two) times daily.    [provider]  oxybutynin (DITROPAN) 5 MG tablet Take 1 tablet (5 mg total) by mouth every 8 (eight) hours as needed for bladder spasms. 10/23/19   Vaillancourt, Aldona Bar, PA-C  OZEMPIC, 1 MG/DOSE, 2 MG/1.5ML SOPN Inject 1 mg into the skin every Monday. In the evening. 01/27/19   [provider]  Polyethyl Glycol-Propyl Glycol (SYSTANE) 0.4-0.3 % SOLN Place 1 drop into both eyes 3 (three) times daily as needed (dry/irritated eyes.).    [provider]  polyethylene glycol (MIRALAX / GLYCOLAX) packet Take 17 g by mouth daily as needed for mild constipation.    [provider]  Potassium (POTASSIMIN PO) Take 1 tablet by mouth every evening.    [provider]  rosuvastatin (CRESTOR) 20 MG tablet Take 20 mg by mouth every evening.  01/27/19   [provider]  sulfamethoxazole-trimethoprim (BACTRIM DS) 800-160 MG tablet Take 1 tablet by mouth every 12 (twelve) hours. 11/11/19   Zara Council A, PA-C  tamsulosin (FLOMAX) 0.4 MG CAPS capsule Take 1 capsule (0.4 mg total) by mouth daily. 10/19/19   Debroah Loop, PA-C  Vitamin D, Ergocalciferol, (DRISDOL) 1.25 MG (50000 UT) CAPS capsule Take 50,000 Units by mouth every Monday.  02/26/18   [provider]  vitamin E 400  UNIT capsule Take 400 Units by mouth every 14 (fourteen) days.     [provider]    Allergies Other  Family History  Problem Relation Age of Onset  . Heart failure Father   . Heart attack Brother   . Kidney cancer Neg Hx   . Bladder Cancer Neg Hx   . Prostate cancer Neg Hx     Social History Social History  Tobacco Use  . Smoking status: Never Smoker  . Smokeless tobacco: Never Used  Substance Use Topics  . Alcohol use: Yes    Alcohol/week: 0.0 standard drinks    Comment: occasionally  . Drug use: No    Types: Marijuana, Cocaine, Opium, LSD    Comment: Past, greater than 10 years ago    Review of Systems  Constitutional:  fever/chills Eyes: No visual changes. ENT: No sore throat. Cardiovascular: Denies chest pain. Respiratory: Denies shortness of breath. Gastrointestinal: No abdominal pain.  No nausea, no vomiting.  No diarrhea.  No constipation. Genitourinary: Negative for dysuria. Musculoskeletal: Negative for back pain. Skin: Negative for rash. Neurological: Negative for headaches, focal weakness    ____________________________________________   PHYSICAL EXAM:  VITAL SIGNS: ED Triage Vitals  Enc Vitals Group     BP 11/12/19 1041 101/61     Pulse Rate 11/12/19 1041 88     Resp 11/12/19 1041 16     Temp 11/12/19 1041 99.2 F (37.3 C)     Temp Source 11/12/19 1041 Oral     SpO2 11/12/19 1041 95 %     Weight 11/12/19 1019 277 lb (125.6 kg)     Height 11/12/19 1019 6' (1.829 m)     Head Circumference --      Peak Flow --      Pain Score 11/12/19 1019 7     Pain Loc --      Pain Edu? --      Excl. in [place]? --     Constitutional: Alert and oriented. Well appearing and in no acute distress. Eyes: Conjunctivae are normal. PER. EOMI. Head: Atraumatic. Nose: No congestion/rhinnorhea. Mouth/Throat: Mucous membranes are moist.  Oropharynx non-erythematous. Neck: No stridor.  Cardiovascular: Normal rate, regular rhythm. Grossly normal heart  sounds.  Good peripheral circulation. Respiratory: Normal respiratory effort.  No retractions. Lungs CTAB. Gastrointestinal: Soft and nontender. No distention. No abdominal bruits. No CVA tenderness. Musculoskeletal: No lower extremity tenderness nor edema.   Neurologic:  Normal speech and language. No gross focal neurologic deficits are appreciated.  Skin:  Skin is warm, dry and intact. No rash noted.   ____________________________________________   LABS (all labs ordered are listed, but only abnormal results are displayed)  Labs Reviewed  CBC - Abnormal; Notable for the following components:      Result Value   WBC 10.9 (*)    RBC 3.72 (*)    Hemoglobin 11.0 (*)    HCT 33.3 (*)    RDW 16.4 (*)    All other components within normal limits  BASIC METABOLIC PANEL - Abnormal; Notable for the following components:   Sodium 131 (*)    Chloride 94 (*)    Glucose, Bld 170 (*)    BUN 47 (*)    Creatinine, Ser 2.87 (*)    Calcium 8.3 (*)    GFR calc non Af Amer 22 (*)    GFR calc Af Amer 25 (*)    All other components within normal limits  SARS CORONAVIRUS 2 BY RT PCR (HOSPITAL ORDER, [facility])  URINALYSIS, COMPLETE (UACMP) WITH MICROSCOPIC   ____________________________________________  EKG Pending  ____________________________________________  RADIOLOGY  ED MD interpretation: CT read by radiology reviewed by me is suspicious for renal infection behind the obstructing stone.  Official radiology report(s): CT Renal Stone Study  Result Date: 11/12/2019 CLINICAL DATA:  Flank pain and fevers EXAM: CT ABDOMEN AND PELVIS WITHOUT CONTRAST TECHNIQUE: Multidetector CT imaging of  the abdomen and pelvis was performed following the standard protocol without IV contrast. COMPARISON:  05/07/2019 FINDINGS: Lower chest: No acute abnormality. Hepatobiliary: No focal liver abnormality is seen. No gallstones, gallbladder wall thickening, or biliary dilatation.  Pancreas: Unremarkable. No pancreatic ductal dilatation or surrounding inflammatory changes. Spleen: Normal in size without focal abnormality. Adrenals/Urinary Tract: Adrenal glands are within normal limits. Right kidney is unremarkable without obstructive change. Left kidney demonstrates hydronephrosis increased from the prior exam with evidence of hydroureter. In the distal left ureter above the ureterovesical junction there is a 15 mm stone identified causing the obstructive change. Multiple calculi are noted within the left kidney the largest of which measures 4.3 cm. Considerable cortical thinning is noted bladder is partially distended. Stomach/Bowel: Scattered diverticular change of the colon is noted without evidence of diverticulitis. The appendix has been surgically removed. Small bowel is unremarkable. No gastric abnormality is seen. Vascular/Lymphatic: Aortic atherosclerosis. No enlarged abdominal or pelvic lymph nodes. Reproductive: Prostate is unremarkable. Other: No abdominal wall hernia or abnormality. No abdominopelvic ascites. Musculoskeletal: No acute or significant osseous findings. IMPRESSION: Interval migration of a 15 mm calculus into the distal left ureter with significant hydronephrosis identified. Perinephric stranding is noted as well suspicious for underlying superimposed infection. Multiple large calculi within the left kidney the largest of which measures 4.3 cm in greatest dimension. Electronically Signed   By: Inez Catalina M.D.   On: 11/12/2019 14:42    ____________________________________________   PROCEDURES  Procedure(s) performed (including Critical Care): Critical care time 30 minutes.  This includes speaking to the hospitalist and Dr. Caprice Beaver and Dr. Erlene Quan and reviewing the gentleman's old records and talking to him in some detail.  Procedures   ____________________________________________   INITIAL IMPRESSION / ASSESSMENT AND PLAN / ED COURSE Patient with  obstructed renal stone and renal infection apparently.  We will get him in the hospital and treated with IV antibiotics.  He has not had his Eliquis today so could get nephrostomy tube fairly rapidly.  We will have to watch him closely as he could possibly deteriorate quickly.  He has had IV antibiotics already though.            ____________________________________________   FINAL CLINICAL IMPRESSION(S) / ED DIAGNOSES  Final diagnoses:  Pyelonephritis  Renal stone  Fall in home, initial encounter     ED Discharge Orders    None       Note:  This document was prepared using Dragon voice recognition software and may include unintentional dictation errors.    Nena Polio, MD 11/12/19 1544

## 2019-11-12 NOTE — H&P (Signed)
History and Physical    [name] KGU:[id number] DOB: 12-21-1950 DOA: 11/12/2019  PCP: Vidal Schwalbe, MD   Patient coming from: Home  I have personally briefly reviewed patient's old medical records in [place]  Chief Complaint: Left-sided flank pain   HPI: [name] is a 69 y.o. male with medical history significant for nephrolithiasis, severe chronic left hydronephrosis, atrial fibrillation on chronic anticoagulation therapy, diabetes mellitus with complications of chronic kidney disease who presents to the emergency room for evaluation after he had a fall.  He also complains of worsening left flank pain.  Patient was seen in the urologist office for evaluation of a fever, weakness and anorexia on 11/11/19.  He had a temp as high as 1051F.  He rated his left flank pain a 9 x 10 in intensity at its worst and it is associated with nausea, urinary frequency, urgency and dysuria. He was offered admission which he declined opting to go home on oral antibiotic therapy. He received a dose of Rocephin in the office and was discharged home on Bactrim Patient went home and states that he was very weak and fell, he denies losing consciousness or feeling dizzy or light headed. He continues to have a low-grade temp, T max of 99.51F as well as left flank pain. He denies having any chest pain, shortness of breath, diaphoresis, cough or any changes in his bowel habits. He had a CT scan of abdomen/ pelvis done without contrast which showed interval migration of a 15 mm calculus into the distal left ureter with significant hydronephrosis identified. Perinephric stranding is noted as well suspicious for underlying superimposed infection. Multiple large calculi within the left kidney the largest of which measures 4.3 cm in greatest dimension. Abdominal x-ray showed large colonic stool volume, suggesting constipation.   ED Course: Patient is a 69 year old male who presents to the emergency  room for evaluation of left flank pain which has worsened over the last 24 hours associated with fever.  Patient had a fall at home with no loss of consciousness and attributes this to weakness.  Imaging shows a 15 mm calculus in the distal left ureter with significant hydronephrosis and perinephric stranding.  Patient received a dose of Rocephin in the ER and will be admitted to the hospital for further evaluation.  Review of Systems: As per HPI otherwise 10 point review of systems negative.    Past Medical History:  Diagnosis Date  . Cardiomyopathy ([place])   . Chronic combined systolic (congestive) and diastolic (congestive) heart failure (HCC)    a. EF 25% by cath in 2013 b. Echo in 07/2015 showing EF of 15-20%, moderate MR, moderate Pulm HTN, severely dilated IVC  . CKD (chronic kidney disease) stage 3, GFR 30-59 ml/min   . Coronary artery disease   . Diabetes mellitus type 2, uncontrolled ([place])    a. A1c 11.0 in 07/2015.  [name] Hypertension   . Kidney stones    Left  . New onset atrial fibrillation ([place])    a. diagnosed in 07/2015 b. started on Eliquis  . Paroxysmal atrial fibrillation (HCC)   . Pollen allergies 09-21-15   pt called and stated that he woke up and had some drainage-pt states he did not see the color of the drainage-pt denies running a fever and this only happened once-pt instructed to call Dr Audree Bane office if he starts running a fever or if the color of drainage becomes yellow/green  . Psoriasis   . Pulmonary hypertension (  [place])   . Renal disorder    kidney stone  . Sleep apnea     Past Surgical History:  Procedure Laterality Date  . CARDIAC CATHETERIZATION N/A 07/25/2015   Procedure: Right/Left Heart Cath and Coronary Angiography;  Surgeon: Wellington Hampshire, MD;  Location: [facility];  Service: Cardiovascular;  Laterality: N/A;  . CARDIAC CATHETERIZATION     [place]  . CARDIAC CATHETERIZATION     [facility] [place]    . CYSTOSCOPY WITH STENT PLACEMENT Left 09/07/2015   Procedure: CYSTOSCOPY WITH STENT PLACEMENT;  Surgeon: Hollice Espy, MD;  Location: [facility];  Service: Urology;  Laterality: Left;  . CYSTOSCOPY/URETEROSCOPY/HOLMIUM LASER/STENT PLACEMENT Left 10/25/2015   Procedure: CYSTOSCOPY/URETEROSCOPY/HOLMIUM LASER/STENT EXCHANGE;  Surgeon: Hollice Espy, MD;  Location: [facility];  Service: Urology;  Laterality: Left;  . CYSTOSCOPY/URETEROSCOPY/HOLMIUM LASER/STENT PLACEMENT Left 05/11/2019   Procedure: CYSTOSCOPY/URETEROSCOPY/HOLMIUM LASER/STENT PLACEMENT;  Surgeon: Hollice Espy, MD;  Location: [facility];  Service: Urology;  Laterality: Left;  . EXTRACORPOREAL SHOCK WAVE LITHOTRIPSY Left 04/02/2019   Procedure: EXTRACORPOREAL SHOCK WAVE LITHOTRIPSY (ESWL);  Surgeon: Hollice Espy, MD;  Location: [facility];  Service: Urology;  Laterality: Left;  . EXTRACORPOREAL SHOCK WAVE LITHOTRIPSY Left 03/05/2019   Procedure: EXTRACORPOREAL SHOCK WAVE LITHOTRIPSY (ESWL);  Surgeon: Abbie Sons, MD;  Location: [facility];  Service: Urology;  Laterality: Left;  . KIDNEY SURGERY    . NEPHROSTOMY TUBE PLACEMENT ([place] [facility]) Left   . URETEROSCOPY WITH HOLMIUM LASER LITHOTRIPSY Left 09/07/2015   Procedure: URETEROSCOPY WITH HOLMIUM LASER LITHOTRIPSY;  Surgeon: Hollice Espy, MD;  Location: [facility];  Service: Urology;  Laterality: Left;     reports that he has never smoked. He has never used smokeless tobacco. He reports current alcohol use. He reports that he does not use drugs.  Allergies  Allergen Reactions  . Other Hives    Blue cheese    Family History  Problem Relation Age of Onset  . Heart failure Father   . Heart attack Brother   . Kidney cancer Neg Hx   . Bladder Cancer Neg Hx   . Prostate cancer Neg Hx      Prior to Admission medications   Medication Sig Start Date End Date Taking? Authorizing Provider  amiodarone (PACERONE) 200 MG tablet Take 1 tablet (200 mg total) by mouth  daily. Patient taking differently: Take 200 mg by mouth every morning.  08/01/15   Hower, Aaron Mose, MD  apixaban (ELIQUIS) 5 MG TABS tablet Take 1 tablet (5 mg total) 2 (two) times daily by mouth. 04/17/17   End, Harrell Gave, MD  Ascorbic Acid (VITAMIN C) 1000 MG tablet Take 1,000 mg by mouth daily.    [provider]  b complex vitamins tablet Take 1 tablet by mouth every evening.     [provider]  baclofen (LIORESAL) 10 MG tablet Take 10 mg by mouth every evening.     [provider]  carvedilol (COREG) 6.25 MG tablet TAKE 1 TABLET BY MOUTH TWICE DAILY Patient taking differently: Take 6.25 mg by mouth 2 (two) times daily.  10/26/16   End, Harrell Gave, MD  cephALEXin (KEFLEX) 250 MG capsule Take 1 capsule (250 mg total) by mouth daily. 10/23/19   Vaillancourt, Aldona Bar, PA-C  clonazePAM (KLONOPIN) 1 MG tablet Take 1 mg by mouth at bedtime.  01/23/17   [provider]  Coenzyme Q10 (COQ10) 400 MG CAPS Take 400 mg by mouth 2 (two) times daily.  [provider]  dextromethorphan-guaiFENesin (MUCINEX DM) 30-600 MG 12hr tablet Take 1 tablet by mouth 2 (two) times daily.    [provider]  docusate sodium (COLACE) 100 MG capsule Take 1 capsule (100 mg total) by mouth 2 (two) times daily. Patient taking differently: Take 100-200 mg by mouth See admin instructions. Take 1 capsule (100 mg) by mouth in the morning & take 2 capsules (200 mg) by mouth at night. 03/05/19   Hollice Espy, MD  furosemide (LASIX) 80 MG tablet Take 1 tablet (80 mg total) by mouth daily. 12/17/17   End, Harrell Gave, MD  glipiZIDE (GLUCOTROL) 10 MG tablet Take 10 mg by mouth 2 (two) times daily.     [provider]  Cathrine Muster 550 MG CAPS Take 550 mg by mouth every evening.     [provider]  HYDROcodone-acetaminophen (NORCO/VICODIN) 5-325 MG tablet Take 1 tablet by mouth every 6 (six) hours as needed for moderate pain. 11/11/19   Zara Council A, PA-C   Insulin Glargine, 2 Unit Dial, (TOUJEO MAX SOLOSTAR) 300 UNIT/ML SOPN Inject 42 Units into the skin every morning. Titrate up to 60 units/day, per endocrinology instructions. 05/05/18   [provider]  JARDIANCE 10 MG TABS tablet Take 10 mg by mouth every evening.  04/28/18   [provider]  losartan (COZAAR) 25 MG tablet Take 25 mg by mouth every evening.  04/28/18   [provider]  magnesium oxide (MAG-OX) 400 MG tablet Take 400 mg by mouth every morning.     [provider]  montelukast (SINGULAIR) 10 MG tablet Take 10 mg by mouth at bedtime.  01/27/18   [provider]  Multiple Vitamins-Minerals (PRESERVISION AREDS 2 PO) Take 1 tablet by mouth 2 (two) times daily.    [provider]  oxybutynin (DITROPAN) 5 MG tablet Take 1 tablet (5 mg total) by mouth every 8 (eight) hours as needed for bladder spasms. 10/23/19   Vaillancourt, Aldona Bar, PA-C  OZEMPIC, 1 MG/DOSE, 2 MG/1.5ML SOPN Inject 1 mg into the skin every Monday. In the evening. 01/27/19   [provider]  Polyethyl Glycol-Propyl Glycol (SYSTANE) 0.4-0.3 % SOLN Place 1 drop into both eyes 3 (three) times daily as needed (dry/irritated eyes.).    [provider]  polyethylene glycol (MIRALAX / GLYCOLAX) packet Take 17 g by mouth daily as needed for mild constipation.    [provider]  Potassium (POTASSIMIN PO) Take 1 tablet by mouth every evening.    [provider]  rosuvastatin (CRESTOR) 20 MG tablet Take 20 mg by mouth every evening.  01/27/19   [provider]  sulfamethoxazole-trimethoprim (BACTRIM DS) 800-160 MG tablet Take 1 tablet by mouth every 12 (twelve) hours. 11/11/19   Zara Council A, PA-C  tamsulosin (FLOMAX) 0.4 MG CAPS capsule Take 1 capsule (0.4 mg total) by mouth daily. 10/19/19   Debroah Loop, PA-C  Vitamin D, Ergocalciferol, (DRISDOL) 1.25 MG (50000 UT) CAPS capsule Take 50,000 Units by mouth every Monday.   02/26/18   [provider]  vitamin E 400 UNIT capsule Take 400 Units by mouth every 14 (fourteen) days.     [provider]    Physical Exam: Vitals:   11/12/19 1019 11/12/19 1041 11/12/19 1500  BP:  101/61 (!) 125/57  Pulse:  88 91  Resp:  16   Temp:  99.2 F (37.3 C)   TempSrc:  Oral   SpO2:  95% 96%  Weight: 125.6 kg  Height: 6' (1.829 m)       Vitals:   11/12/19 1019 11/12/19 1041 11/12/19 1500  BP:  101/61 (!) 125/57  Pulse:  88 91  Resp:  16   Temp:  99.2 F (37.3 C)   TempSrc:  Oral   SpO2:  95% 96%  Weight: 125.6 kg    Height: 6' (1.829 m)      Constitutional: NAD, alert and oriented x 3 Eyes: PERRL, lids and conjunctivae pallor ENMT: Mucous membranes are moist.  Neck: normal, supple, no masses, no thyromegaly Respiratory: clear to auscultation bilaterally, no wheezing, no crackles. Normal respiratory effort. No accessory muscle use.  Cardiovascular: Regular rate and rhythm, no murmurs / rubs / gallops. No extremity edema. 2+ pedal pulses. No carotid bruits.  Abdomen: left CVA tenderness, no masses palpated. No hepatosplenomegaly. Bowel sounds positive.  Musculoskeletal: no clubbing / cyanosis. No joint deformity upper and lower extremities.  Skin: no rashes, lesions, ulcers.  Neurologic: No gross focal neurologic deficit. Psychiatric: Normal mood and affect.   Labs on Admission: I have personally reviewed following labs and imaging studies  CBC: Recent Labs  Lab 11/12/19 1021  WBC 10.9*  HGB 11.0*  HCT 33.3*  MCV 89.5  PLT 272   Basic Metabolic Panel: Recent Labs  Lab 11/12/19 1021  NA 131*  K 4.1  CL 94*  CO2 24  GLUCOSE 170*  BUN 47*  CREATININE 2.87*  CALCIUM 8.3*   GFR: Estimated Creatinine Clearance: 34.2 mL/min (A) (by C-G formula based on SCr of 2.87 mg/dL (H)). Liver Function Tests: No results for input(s): AST, ALT, ALKPHOS, BILITOT, PROT, ALBUMIN in the last 168 hours. No results for input(s): LIPASE,  AMYLASE in the last 168 hours. No results for input(s): AMMONIA in the last 168 hours. Coagulation Profile: No results for input(s): INR, PROTIME in the last 168 hours. Cardiac Enzymes: No results for input(s): CKTOTAL, CKMB, CKMBINDEX, TROPONINI in the last 168 hours. BNP (last 3 results) No results for input(s): PROBNP in the last 8760 hours. HbA1C: No results for input(s): HGBA1C in the last 72 hours. CBG: No results for input(s): GLUCAP in the last 168 hours. Lipid Profile: No results for input(s): CHOL, HDL, LDLCALC, TRIG, CHOLHDL, LDLDIRECT in the last 72 hours. Thyroid Function Tests: No results for input(s): TSH, T4TOTAL, FREET4, T3FREE, THYROIDAB in the last 72 hours. Anemia Panel: No results for input(s): VITAMINB12, FOLATE, FERRITIN, TIBC, IRON, RETICCTPCT in the last 72 hours. Urine analysis:    Component Value Date/Time   COLORURINE YELLOW 04/28/2019 1402   APPEARANCEUR Cloudy (A) 11/11/2019 1252   LABSPEC 1.015 04/28/2019 1402   PHURINE 7.0 04/28/2019 1402   GLUCOSEU 2+ (A) 11/11/2019 1252   HGBUR MODERATE (A) 04/28/2019 1402   BILIRUBINUR Negative 11/11/2019 1252   KETONESUR NEGATIVE 04/28/2019 1402   PROTEINUR Trace (A) 11/11/2019 1252   PROTEINUR NEGATIVE 04/28/2019 1402   NITRITE Negative 11/11/2019 1252   NITRITE NEGATIVE 04/28/2019 1402   LEUKOCYTESUR Trace (A) 11/11/2019 1252   LEUKOCYTESUR SMALL (A) 04/28/2019 1402    Radiological Exams on Admission: DG Abd 1 View  Result Date: 11/12/2019 CLINICAL DATA:  Flank pain. Nausea and decreased appetite for 2 days. Concern for kidney stones. EXAM: ABDOMEN - 1 VIEW COMPARISON:  05/07/2019 CT.  Plain film 09/19/2019. FINDINGS: Nonobstructive bowel gas pattern. A large amount of colonic stool, including projecting over the left kidney. The previously described lower pole left renal collecting system calculus may be identified on the second image, 4.2 cm.  IMPRESSION: Large colonic stool volume, suggesting  constipation. The previously described lower pole left renal collecting system dominant stone is partially obscured, favored to be visualized. Electronically Signed   By: Abigail Miyamoto M.D.   On: 11/12/2019 09:34   CT Renal Stone Study  Result Date: 11/12/2019 CLINICAL DATA:  Flank pain and fevers EXAM: CT ABDOMEN AND PELVIS WITHOUT CONTRAST TECHNIQUE: Multidetector CT imaging of the abdomen and pelvis was performed following the standard protocol without IV contrast. COMPARISON:  05/07/2019 FINDINGS: Lower chest: No acute abnormality. Hepatobiliary: No focal liver abnormality is seen. No gallstones, gallbladder wall thickening, or biliary dilatation. Pancreas: Unremarkable. No pancreatic ductal dilatation or surrounding inflammatory changes. Spleen: Normal in size without focal abnormality. Adrenals/Urinary Tract: Adrenal glands are within normal limits. Right kidney is unremarkable without obstructive change. Left kidney demonstrates hydronephrosis increased from the prior exam with evidence of hydroureter. In the distal left ureter above the ureterovesical junction there is a 15 mm stone identified causing the obstructive change. Multiple calculi are noted within the left kidney the largest of which measures 4.3 cm. Considerable cortical thinning is noted bladder is partially distended. Stomach/Bowel: Scattered diverticular change of the colon is noted without evidence of diverticulitis. The appendix has been surgically removed. Small bowel is unremarkable. No gastric abnormality is seen. Vascular/Lymphatic: Aortic atherosclerosis. No enlarged abdominal or pelvic lymph nodes. Reproductive: Prostate is unremarkable. Other: No abdominal wall hernia or abnormality. No abdominopelvic ascites. Musculoskeletal: No acute or significant osseous findings. IMPRESSION: Interval migration of a 15 mm calculus into the distal left ureter with significant hydronephrosis identified. Perinephric stranding is noted as well  suspicious for underlying superimposed infection. Multiple large calculi within the left kidney the largest of which measures 4.3 cm in greatest dimension. Electronically Signed   By: Inez Catalina M.D.   On: 11/12/2019 14:42    EKG: Independently reviewed.   Assessment/Plan Principal Problem:   Acute pyelonephritis Active Problems:   Paroxysmal atrial fibrillation (HCC)   OSA (obstructive sleep apnea)   Hydronephrosis with renal and ureteral calculus obstruction   NICM (nonischemic cardiomyopathy) (HCC)   Chronic kidney disease, stage III (moderate)   Chronic diastolic CHF (congestive heart failure) ([place])    Acute pyelonephritis  Patient presents for evaluation of worsening left flank pain with associated low grade fever Patient has a history of chronic left-sided hydronephrosis Imaging shows perinephric stranding We will treat patient empirically with Rocephin 1 g IV daily Follow-up results of urine culture We will request urology consult   Paroxysmal A. Fib We will hold Eliquis until patient is evaluated by urology and if no procedures planned will resume Eliquis Continue amiodarone for rate control but hold carvedilol due to relative hypotension   Hydronephrosis with renal and ureteral calculus obstruction Patient noted to have an obstructing calculus with severe left-sided hydronephrosis We will request urology consult   Chronic diastolic heart failure Hold furosemide, losartan and carvedilol due to relative hypotension.   Diabetes mellitus with complications of stage III chronic kidney disease Hold oral hypoglycemic agent Place patient on consistent carbohydrate diet Judicious IV fluid hydration Sliding scale coverage  DVT prophylaxis: SCD. Code Status: Full code Family Communication: Greater than 50% of time was spent discussing patient's condition and plan of care with him at the bedside.  All questions and concerns have been addressed.  He verbalizes  understanding and agrees with the plan. Disposition Plan: Back to previous home environment Consults called: Urology    Collier Bullock MD [facility]  11/12/2019, 3:32 PM

## 2019-11-12 NOTE — ED Triage Notes (Signed)
Pt comes via POV from home with c/o fall and flank pain. Pt states his back gave out on him and he fell this am. Pt also c/o kidney issues. Pt states he was here yesterday and was suppose to be admitted.  Pt denies any belly pain.

## 2019-11-12 NOTE — ED Notes (Signed)
Assigned bed @ 1639. Spoke with Psychologist, clinical.

## 2019-11-12 NOTE — Consult Note (Signed)
Urology Consult  I have been asked to see the patient by Dr. Cinda Quest, for evaluation and management of a 10x7mm distal left ureteral stone + left pyelonephritis.  Chief Complaint: Left flank pain, fever, fall  History of Present Illness: [name] is a 69 y.o. year old male with PMH severe left renal atrophy, chronic left hydronephrosis, CKD, and recurrent nephrolithiasis well-known to our practice who presented to the ED today after a fall.  He has been managed closely by our clinic since mid April 2021 for management of UTI versus acute stone episode and has been treated with IM and PO antibiotics and Flomax for this.  He was seen in clinic most recently yesterday after reporting worsening left flank pain and fever up to 102 F.  An extensive conversation with the patient, Zara Council, and Dr. Erlene Quan, the decision was made to continue conservative outpatient management given his comorbidities and chronic left renal problems.  He received 1 dose of ceftriaxone 1 g in the office and prescribed Bactrim DS twice daily x7 days.  Today, patient reports continued left flank pain and subjective fever.  He states he was preparing breakfast for his dogs this morning when he fell and was unable to get up.  At that point, he elected to proceed to the ED for further evaluation.  He reports continued severe, 9-10/10, left flank pain and subjective fever without nausea or vomiting.  ED labs notable for mild leukocytosis of 10.9 and creatinine of 2.87, increased over baseline between 1.6-2.5.  UA today pending, however UA in clinic yesterday notable only for mild pyuria with 6-10 WBCs/hpf without nitrites. Urine culture pending.  CT stone study today revealed a 10 x 15 mm distal left ureteral stone, significant hydronephrosis, perinephric stranding consistent with left pyelonephritis, and extensive left renal stone burden consistent with prior.  Patient is on anticoagulation with Eliquis, most  recently yesterday evening.  Past Medical History:  Diagnosis Date  . Cardiomyopathy ([place])   . Chronic combined systolic (congestive) and diastolic (congestive) heart failure (HCC)    a. EF 25% by cath in 2013 b. Echo in 07/2015 showing EF of 15-20%, moderate MR, moderate Pulm HTN, severely dilated IVC  . CKD (chronic kidney disease) stage 3, GFR 30-59 ml/min   . Coronary artery disease   . Diabetes mellitus type 2, uncontrolled ([place])    a. A1c 11.0 in 07/2015.  [name] Hypertension   . Kidney stones    Left  . New onset atrial fibrillation ([place])    a. diagnosed in 07/2015 b. started on Eliquis  . Paroxysmal atrial fibrillation (HCC)   . Pollen allergies 09-21-15   pt called and stated that he woke up and had some drainage-pt states he did not see the color of the drainage-pt denies running a fever and this only happened once-pt instructed to call Dr Audree Bane office if he starts running a fever or if the color of drainage becomes yellow/green  . Psoriasis   . Pulmonary hypertension ([place])   . Renal disorder    kidney stone  . Sleep apnea     Past Surgical History:  Procedure Laterality Date  . CARDIAC CATHETERIZATION N/A 07/25/2015   Procedure: Right/Left Heart Cath and Coronary Angiography;  Surgeon: Wellington Hampshire, MD;  Location: [facility];  Service: Cardiovascular;  Laterality: N/A;  . CARDIAC CATHETERIZATION     [place]  . [place], [place]  WITH STENT PLACEMENT    . CYSTOSCOPY WITH STENT PLACEMENT Left 09/07/2015   Procedure: CYSTOSCOPY WITH STENT PLACEMENT;  Surgeon: Hollice Espy, MD;  Location: [facility];  Service: Urology;  Laterality: Left;  . CYSTOSCOPY/URETEROSCOPY/HOLMIUM LASER/STENT PLACEMENT Left 10/25/2015   Procedure: CYSTOSCOPY/URETEROSCOPY/HOLMIUM LASER/STENT EXCHANGE;  Surgeon: Hollice Espy, MD;  Location: [facility];  Service: Urology;  Laterality: Left;  . CYSTOSCOPY/URETEROSCOPY/HOLMIUM  LASER/STENT PLACEMENT Left 05/11/2019   Procedure: CYSTOSCOPY/URETEROSCOPY/HOLMIUM LASER/STENT PLACEMENT;  Surgeon: Hollice Espy, MD;  Location: [facility];  Service: Urology;  Laterality: Left;  . EXTRACORPOREAL SHOCK WAVE LITHOTRIPSY Left 04/02/2019   Procedure: EXTRACORPOREAL SHOCK WAVE LITHOTRIPSY (ESWL);  Surgeon: Hollice Espy, MD;  Location: [facility];  Service: Urology;  Laterality: Left;  . EXTRACORPOREAL SHOCK WAVE LITHOTRIPSY Left 03/05/2019   Procedure: EXTRACORPOREAL SHOCK WAVE LITHOTRIPSY (ESWL);  Surgeon: Abbie Sons, MD;  Location: [facility];  Service: Urology;  Laterality: Left;  . KIDNEY SURGERY    . NEPHROSTOMY TUBE PLACEMENT ([place] HX) Left   . URETEROSCOPY WITH HOLMIUM LASER LITHOTRIPSY Left 09/07/2015   Procedure: URETEROSCOPY WITH HOLMIUM LASER LITHOTRIPSY;  Surgeon: Hollice Espy, MD;  Location: [facility];  Service: Urology;  Laterality: Left;    Home Medications:  Current Meds  Medication Sig  . amiodarone (PACERONE) 200 MG tablet Take 1 tablet (200 mg total) by mouth daily. (Patient taking differently: Take 200 mg by mouth every morning. )  . apixaban (ELIQUIS) 5 MG TABS tablet Take 1 tablet (5 mg total) 2 (two) times daily by mouth.  . Ascorbic Acid (VITAMIN C) 1000 MG tablet Take 1,000 mg by mouth daily.  [name] b complex vitamins tablet Take 1 tablet by mouth every evening.   . baclofen (LIORESAL) 10 MG tablet Take 10 mg by mouth every evening.   . carvedilol (COREG) 6.25 MG tablet TAKE 1 TABLET BY MOUTH TWICE DAILY (Patient taking differently: Take 6.25 mg by mouth 2 (two) times daily. )  . clonazePAM (KLONOPIN) 1 MG tablet Take 1 mg by mouth at bedtime.   . Coenzyme Q10 (COQ10) 400 MG CAPS Take 400 mg by mouth 2 (two) times daily.  [name] dextromethorphan-guaiFENesin (MUCINEX DM) 30-600 MG 12hr tablet Take 1 tablet by mouth 2 (two) times daily.  [name] docusate sodium (COLACE) 100 MG capsule Take 1 capsule (100 mg total) by mouth 2 (two) times daily. (Patient taking  differently: Take 100-200 mg by mouth See admin instructions. Take 1 capsule (100 mg) by mouth in the morning & take 2 capsules (200 mg) by mouth at night.)  . furosemide (LASIX) 80 MG tablet Take 1 tablet (80 mg total) by mouth daily.  [name] glipiZIDE (GLUCOTROL) 10 MG tablet Take 10 mg by mouth 2 (two) times daily.   [name] Hawthorne Berry 550 MG CAPS Take 550 mg by mouth every evening.   [name] HYDROcodone-acetaminophen (NORCO/VICODIN) 5-325 MG tablet Take 1 tablet by mouth every 6 (six) hours as needed for moderate pain.  . Insulin Glargine, 2 Unit Dial, (TOUJEO MAX SOLOSTAR) 300 UNIT/ML SOPN Inject 40 Units into the skin every morning. Titrate up to 60 units/day, per endocrinology instructions.  [name] JARDIANCE 10 MG TABS tablet Take 10 mg by mouth every evening.   [name] losartan (COZAAR) 25 MG tablet Take 25 mg by mouth every evening.   . magnesium oxide (MAG-OX) 400 MG tablet Take 400 mg by mouth every morning.   . montelukast (SINGULAIR) 10 MG tablet Take 10 mg by mouth at bedtime.   . Multiple Vitamins-Minerals (PRESERVISION AREDS 2 PO)  Take 1 tablet by mouth 2 (two) times daily.  [name] oxybutynin (DITROPAN) 5 MG tablet Take 1 tablet (5 mg total) by mouth every 8 (eight) hours as needed for bladder spasms.  . polyethylene glycol (MIRALAX / GLYCOLAX) packet Take 17 g by mouth daily as needed for mild constipation.  . rosuvastatin (CRESTOR) 20 MG tablet Take 20 mg by mouth every evening.   . sulfamethoxazole-trimethoprim (BACTRIM DS) 800-160 MG tablet Take 1 tablet by mouth every 12 (twelve) hours.  . tamsulosin (FLOMAX) 0.4 MG CAPS capsule Take 1 capsule (0.4 mg total) by mouth daily.    Allergies:  Allergies  Allergen Reactions  . Other Hives    Blue cheese    Family History  Problem Relation Age of Onset  . Heart failure Father   . Heart attack Brother   . Kidney cancer Neg Hx   . Bladder Cancer Neg Hx   . Prostate cancer Neg Hx     Social History:  reports that he has never smoked. He has never  used smokeless tobacco. He reports current alcohol use. He reports that he does not use drugs.  ROS: A complete review of systems was performed.  All systems are negative except for pertinent findings as noted.  Physical Exam:  Vital signs in last 24 hours: Temp:  [99.2 F (37.3 C)] 99.2 F (37.3 C) (06/10 1041) Pulse Rate:  [88-91] 91 (06/10 1500) Resp:  [16] 16 (06/10 1041) BP: (101-125)/(57-61) 125/57 (06/10 1500) SpO2:  [95 %-96 %] 96 % (06/10 1500) Weight:  [125.6 kg] 125.6 kg (06/10 1019) Constitutional:  Alert and oriented, no acute distress HEENT: [place] AT, moist mucus membranes Cardiovascular: No clubbing, cyanosis, or edema. Respiratory: Normal respiratory effort Skin: No rashes, bruises or suspicious lesions; no diaphoresis Neurologic: Grossly intact, no focal deficits, moving all 4 extremities Psychiatric: Normal mood and affect  Laboratory Data:  Recent Labs    11/12/19 1021  WBC 10.9*  HGB 11.0*  HCT 33.3*   Recent Labs    11/12/19 1021  NA 131*  K 4.1  CL 94*  CO2 24  GLUCOSE 170*  BUN 47*  CREATININE 2.87*  CALCIUM 8.3*   Urinalysis    Component Value Date/Time   COLORURINE YELLOW 04/28/2019 1402   APPEARANCEUR Cloudy (A) 11/11/2019 1252   LABSPEC 1.015 04/28/2019 1402   PHURINE 7.0 04/28/2019 1402   GLUCOSEU 2+ (A) 11/11/2019 1252   HGBUR MODERATE (A) 04/28/2019 1402   BILIRUBINUR Negative 11/11/2019 1252   KETONESUR NEGATIVE 04/28/2019 1402   PROTEINUR Trace (A) 11/11/2019 1252   PROTEINUR NEGATIVE 04/28/2019 1402   NITRITE Negative 11/11/2019 1252   NITRITE NEGATIVE 04/28/2019 1402   LEUKOCYTESUR Trace (A) 11/11/2019 1252   LEUKOCYTESUR SMALL (A) 04/28/2019 1402   Results for orders placed or performed in visit on 11/11/19  Microscopic Examination     Status: Abnormal   Collection Time: 11/11/19 12:52 PM   Urine  Result Value Ref Range Status   WBC, UA 6-10 (A) 0 - 5 /hpf Final   RBC 0-2 0 - 2 /hpf Final   Epithelial Cells (non  renal) 0-10 0 - 10 /hpf Final   Casts Present (A) None seen /lpf Final   Cast Type Granular casts (A) N/A Final   Crystals Present (A) N/A Final   Crystal Type Amorphous Sediment N/A Final   Bacteria, UA Few None seen/Few Final   Radiologic Imaging: CT Renal Stone Study  Result Date: 11/12/2019 CLINICAL DATA:  Flank pain  and fevers EXAM: CT ABDOMEN AND PELVIS WITHOUT CONTRAST TECHNIQUE: Multidetector CT imaging of the abdomen and pelvis was performed following the standard protocol without IV contrast. COMPARISON:  05/07/2019 FINDINGS: Lower chest: No acute abnormality. Hepatobiliary: No focal liver abnormality is seen. No gallstones, gallbladder wall thickening, or biliary dilatation. Pancreas: Unremarkable. No pancreatic ductal dilatation or surrounding inflammatory changes. Spleen: Normal in size without focal abnormality. Adrenals/Urinary Tract: Adrenal glands are within normal limits. Right kidney is unremarkable without obstructive change. Left kidney demonstrates hydronephrosis increased from the prior exam with evidence of hydroureter. In the distal left ureter above the ureterovesical junction there is a 15 mm stone identified causing the obstructive change. Multiple calculi are noted within the left kidney the largest of which measures 4.3 cm. Considerable cortical thinning is noted bladder is partially distended. Stomach/Bowel: Scattered diverticular change of the colon is noted without evidence of diverticulitis. The appendix has been surgically removed. Small bowel is unremarkable. No gastric abnormality is seen. Vascular/Lymphatic: Aortic atherosclerosis. No enlarged abdominal or pelvic lymph nodes. Reproductive: Prostate is unremarkable. Other: No abdominal wall hernia or abnormality. No abdominopelvic ascites. Musculoskeletal: No acute or significant osseous findings. IMPRESSION: Interval migration of a 15 mm calculus into the distal left ureter with significant hydronephrosis identified.  Perinephric stranding is noted as well suspicious for underlying superimposed infection. Multiple large calculi within the left kidney the largest of which measures 4.3 cm in greatest dimension. Electronically Signed   By: Inez Catalina M.D.   On: 11/12/2019 14:42   Assessment & Plan:  69 year old comorbid male with an extensive urologic history notable for severe chronic left renal atrophy, left hydronephrosis, CKD, and recurrent nephrolithiasis presents in the setting of recent conservatively managed outpatient acute stone episode versus UTI after a fall with the inability to rise.  ED CT notable for a 10 x 15 mm distal left ureteral stone and left pyelonephritis.  Given patient's extensive comorbidities, we are electing to manage him conservatively pending acute decompensation as below.  Recommend holding anticoagulation pending clinical decompensation to allow for future intervention as indicated.  Ultimately, patient will likely require left nephrectomy, however we are not planning this for his current admission.  I discussed this plan at the bedside with the patient and he is in agreement.  Plan: -Admit to medicine -Supportive care: fluid resuscitation, IV antibiotics, pain control -Hold Eliquis -May consider left PCN placement pending clinical decline off anticoagulation  Thank you for involving me in this patient's care, I will continue to follow along.  Debroah Loop, PA-C 11/12/2019 4:26 PM

## 2019-11-13 DIAGNOSIS — I5032 Chronic diastolic (congestive) heart failure: Secondary | ICD-10-CM

## 2019-11-13 DIAGNOSIS — N132 Hydronephrosis with renal and ureteral calculous obstruction: Secondary | ICD-10-CM

## 2019-11-13 DIAGNOSIS — N200 Calculus of kidney: Secondary | ICD-10-CM

## 2019-11-13 DIAGNOSIS — I480 Paroxysmal atrial fibrillation: Secondary | ICD-10-CM

## 2019-11-13 LAB — BASIC METABOLIC PANEL
Anion gap: 10 (ref 5–15)
BUN: 55 mg/dL — ABNORMAL HIGH (ref 8–23)
CO2: 25 mmol/L (ref 22–32)
Calcium: 7.9 mg/dL — ABNORMAL LOW (ref 8.9–10.3)
Chloride: 96 mmol/L — ABNORMAL LOW (ref 98–111)
Creatinine, Ser: 2.94 mg/dL — ABNORMAL HIGH (ref 0.61–1.24)
GFR calc Af Amer: 24 mL/min — ABNORMAL LOW (ref >60)
GFR calc non Af Amer: 21 mL/min — ABNORMAL LOW (ref >60)
Glucose, Bld: 162 mg/dL — ABNORMAL HIGH (ref 70–99)
Potassium: 3.7 mmol/L (ref 3.5–5.1)
Sodium: 131 mmol/L — ABNORMAL LOW (ref 135–145)

## 2019-11-13 LAB — CBC
HCT: 30.9 % — ABNORMAL LOW (ref 39.0–52.0)
Hemoglobin: 10.1 g/dL — ABNORMAL LOW (ref 13.0–17.0)
MCH: 29.5 pg (ref 26.0–34.0)
MCHC: 32.7 g/dL (ref 30.0–36.0)
MCV: 90.4 fL (ref 80.0–100.0)
Platelets: 190 10*3/uL (ref 150–400)
RBC: 3.42 MIL/uL — ABNORMAL LOW (ref 4.22–5.81)
RDW: 16.3 % — ABNORMAL HIGH (ref 11.5–15.5)
WBC: 7.8 10*3/uL (ref 4.0–10.5)
nRBC: 0 % (ref 0.0–0.2)

## 2019-11-13 LAB — URINALYSIS, COMPLETE (UACMP) WITH MICROSCOPIC
Bilirubin Urine: NEGATIVE
Glucose, UA: >=500 mg/dL — AB
Hgb urine dipstick: NEGATIVE
Ketones, ur: NEGATIVE mg/dL
Nitrite: NEGATIVE
Protein, ur: 30 mg/dL — AB
Specific Gravity, Urine: 1.013 (ref 1.005–1.030)
pH: 5 (ref 5.0–8.0)

## 2019-11-13 LAB — CULTURE, URINE COMPREHENSIVE

## 2019-11-13 LAB — GLUCOSE, CAPILLARY
Glucose-Capillary: 164 mg/dL — ABNORMAL HIGH (ref 70–99)
Glucose-Capillary: 158 mg/dL — ABNORMAL HIGH (ref 70–99)
Glucose-Capillary: 171 mg/dL — ABNORMAL HIGH (ref 70–99)
Glucose-Capillary: 161 mg/dL — ABNORMAL HIGH (ref 70–99)

## 2019-11-13 LAB — HEMOGLOBIN A1C
Hgb A1c MFr Bld: 6.3 % — ABNORMAL HIGH (ref 4.8–5.6)
Mean Plasma Glucose: 134.11 mg/dL

## 2019-11-13 MED ORDER — GUAIFENESIN ER 600 MG PO TB12
600.0000 mg | ORAL_TABLET | Freq: Two times a day (BID) | ORAL | Status: DC
  Administered 2019-11-13 – 2019-11-14 (×2): 600 mg via ORAL
  Filled 2019-11-13 (×2): qty 1

## 2019-11-13 MED ORDER — GUAIFENESIN ER 600 MG PO TB12
600.0000 mg | ORAL_TABLET | Freq: Two times a day (BID) | ORAL | Status: DC
  Filled 2019-11-13: qty 1

## 2019-11-13 MED ORDER — CEFAZOLIN SODIUM-DEXTROSE 1-4 GM/50ML-% IV SOLN
1.0000 g | Freq: Two times a day (BID) | INTRAVENOUS | Status: DC
  Administered 2019-11-13 – 2019-11-14 (×3): 1 g via INTRAVENOUS
  Filled 2019-11-13 (×4): qty 50

## 2019-11-13 MED ORDER — METOPROLOL TARTRATE 25 MG PO TABS
12.5000 mg | ORAL_TABLET | Freq: Two times a day (BID) | ORAL | Status: DC
  Administered 2019-11-13 – 2019-11-15 (×4): 12.5 mg via ORAL
  Filled 2019-11-13 (×4): qty 1

## 2019-11-13 NOTE — Progress Notes (Signed)
Patient complaining of congestion.  Is normally on Mucinex DM BID.  Paged MD MD to order mucinex.

## 2019-11-13 NOTE — Plan of Care (Signed)
  Problem: Education: Goal: Knowledge of General Education information will improve Description: Including pain rating scale, medication(s)/side effects and non-pharmacologic comfort measures Outcome: Progressing   Problem: Health Behavior/Discharge Planning: Goal: Ability to manage health-related needs will improve Outcome: Progressing   Problem: Clinical Measurements: Goal: Ability to maintain clinical measurements within normal limits will improve Outcome: Progressing Goal: Diagnostic test results will improve Outcome: Progressing   Problem: Coping: Goal: Level of anxiety will decrease Outcome: Progressing

## 2019-11-13 NOTE — Consult Note (Signed)
NAME: Jim Jim  DOB: 04/21/1951  MRN: [id number]  Date/Time: 11/13/2019 12:28 PM  REQUESTING PROVIDER: Erlinda Hong Subjective:  REASON FOR CONSULT: UTI ? Jim Jim is a 69 y.o. with a history of renal stones needs multiple treatment over the past many years, chronic hydronephrosis left kidney , Afib, DM with nephropathy presented to the ED after a fall. Pt went [facility] urology on 6/9 with fever of 2 days duration. Left flank pain for many days. He was having urination difficulty.  CATH UA yellow cloudy, 2+ glucose, 1+ ketone, 0-10 WBC's, granular casts and amorphous sediment I-culture sent and he was given a dose of Ceftriaxone and sent home on  bactrim prescription. Pt took 1 dose of bactrim and the next morning he fell at home because of back giving out and he came to the ED Pt has been to urology every month with fever and flank pain for the past 2 months- In April he had staph epi and staph hominis in the urine and was given cefdinir. In May it was staph epi and was given keflex  He had on 05/11/19 cystoscopy, uretoroscopy/laster/stent exchange left 05/20/20- cystoscopy /stent removed- given cipro  03/05/19-lithotripsy left 04/02/19-lithotripsy  09/07/15- cystoscopy with stent/holmium laser left  10/25/15 cystoscopy, uretoroscopy/laster/stent exchange left  Dr.Brandon had discussed with him regarding nephrectomy and patient had wanted to hold off as long as he had renal function in the left kidney   Past Medical History:  Diagnosis Date  . Cardiomyopathy ([place])   . Chronic combined systolic (congestive) and diastolic (congestive) heart failure (HCC)    a. EF 25% by cath in 2013 b. Echo in 07/2015 showing EF of 15-20%, moderate MR, moderate Pulm HTN, severely dilated IVC  . CKD (chronic kidney disease) stage 3, GFR 30-59 ml/min   . Coronary artery disease   . Diabetes mellitus type 2, uncontrolled ([place])    a. A1c 11.0 in 07/2015.  [name] Hypertension   . Kidney stones    Left  .  New onset atrial fibrillation ([place])    a. diagnosed in 07/2015 b. started on Eliquis  . Paroxysmal atrial fibrillation (HCC)   . Pollen allergies 09-21-15   pt called and stated that he woke up and had some drainage-pt states he did not see the color of the drainage-pt denies running a fever and this only happened once-pt instructed to call Dr Audree Bane office if he starts running a fever or if the color of drainage becomes yellow/green  . Psoriasis   . Pulmonary hypertension ([place])   . Renal disorder    kidney stone  . Sleep apnea     Past Surgical History:  Procedure Laterality Date  . CARDIAC CATHETERIZATION N/A 07/25/2015   Procedure: Right/Left Heart Cath and Coronary Angiography;  Surgeon: Wellington Hampshire, MD;  Location: [facility];  Service: Cardiovascular;  Laterality: N/A;  . CARDIAC CATHETERIZATION     [place]  . CARDIAC CATHETERIZATION     [facility] [place]    . CYSTOSCOPY WITH STENT PLACEMENT Left 09/07/2015   Procedure: CYSTOSCOPY WITH STENT PLACEMENT;  Surgeon: Hollice Espy, MD;  Location: [facility];  Service: Urology;  Laterality: Left;  . CYSTOSCOPY/URETEROSCOPY/HOLMIUM LASER/STENT PLACEMENT Left 10/25/2015   Procedure: CYSTOSCOPY/URETEROSCOPY/HOLMIUM LASER/STENT EXCHANGE;  Surgeon: Hollice Espy, MD;  Location: [facility];  Service: Urology;  Laterality: Left;  . CYSTOSCOPY/URETEROSCOPY/HOLMIUM LASER/STENT PLACEMENT Left 05/11/2019   Procedure: CYSTOSCOPY/URETEROSCOPY/HOLMIUM LASER/STENT PLACEMENT;  Surgeon: Hollice Espy, MD;  Location:  [facility];  Service: Urology;  Laterality: Left;  . EXTRACORPOREAL SHOCK WAVE LITHOTRIPSY Left 04/02/2019   Procedure: EXTRACORPOREAL SHOCK WAVE LITHOTRIPSY (ESWL);  Surgeon: Hollice Espy, MD;  Location: [facility];  Service: Urology;  Laterality: Left;  . EXTRACORPOREAL SHOCK WAVE LITHOTRIPSY Left 03/05/2019   Procedure: EXTRACORPOREAL SHOCK WAVE LITHOTRIPSY (ESWL);  Surgeon:  Abbie Sons, MD;  Location: [facility];  Service: Urology;  Laterality: Left;  . KIDNEY SURGERY    . NEPHROSTOMY TUBE PLACEMENT ([place] HX) Left   . URETEROSCOPY WITH HOLMIUM LASER LITHOTRIPSY Left 09/07/2015   Procedure: URETEROSCOPY WITH HOLMIUM LASER LITHOTRIPSY;  Surgeon: Hollice Espy, MD;  Location: [facility];  Service: Urology;  Laterality: Left;    Social History   Socioeconomic History  . Marital status: Single    Spouse name: Not on file  . Number of children: Not on file  . Years of education: Not on file  . Highest education level: Not on file  Occupational History  . Not on file  Tobacco Use  . Smoking status: Never Smoker  . Smokeless tobacco: Never Used  Substance and Sexual Activity  . Alcohol use: Yes    Alcohol/week: 0.0 standard drinks    Comment: occasionally  . Drug use: No    Types: Marijuana, Cocaine, Opium, LSD    Comment: Past, greater than 10 years ago  . Sexual activity: Not on file  Other Topics Concern  . Not on file  Social History Narrative  . Not on file   Social Determinants of Health   Financial Resource Strain:   . Difficulty of Paying Living Expenses:   Food Insecurity:   . Worried About Charity fundraiser in the Last Year:   . Arboriculturist in the Last Year:   Transportation Needs:   . Film/video editor (Medical):   [name] Lack of Transportation (Non-Medical):   Physical Activity:   . Days of Exercise per Week:   . Minutes of Exercise per Session:   Stress:   . Feeling of Stress :   Social Connections:   . Frequency of Communication with Friends and Family:   . Frequency of Social Gatherings with Friends and Family:   . Attends Religious Services:   . Active Member of Clubs or Organizations:   . Attends Archivist Meetings:   [name] Marital Status:   Intimate Partner Violence:   . Fear of Current or Ex-Partner:   . Emotionally Abused:   [name] Physically Abused:   . Sexually Abused:     Family History  Problem  Relation Age of Onset  . Heart failure Father   . Heart attack Brother   . Kidney cancer Neg Hx   . Bladder Cancer Neg Hx   . Prostate cancer Neg Hx    Allergies  Allergen Reactions  . Other Hives    Blue cheese   ? Current Facility-Administered Medications  Medication Dose Route Frequency Provider Last Rate Last Admin  . 0.9 %  sodium chloride infusion   Intravenous Continuous Agbata, Tochukwu, MD 100 mL/hr at 11/13/19 1221 New Bag at 11/13/19 1221  . amiodarone (PACERONE) tablet 200 mg  200 mg Oral Daily Agbata, Tochukwu, MD   200 mg at 11/13/19 0838  . ascorbic acid (VITAMIN C) tablet 1,000 mg  1,000 mg Oral Daily Agbata, Tochukwu, MD   1,000 mg at 11/13/19 0838  . baclofen (LIORESAL) tablet 10 mg  10 mg Oral QPM Agbata, Tochukwu, MD   10  mg at 11/12/19 2034  . clonazePAM (KLONOPIN) tablet 1 mg  1 mg Oral QHS Agbata, Tochukwu, MD   1 mg at 11/12/19 2032  . HYDROcodone-acetaminophen (NORCO/VICODIN) 5-325 MG per tablet 1 tablet  1 tablet Oral Q6H PRN Agbata, Tochukwu, MD   1 tablet at 11/13/19 0839  . insulin aspart (novoLOG) injection 0-20 Units  0-20 Units Subcutaneous TID WC Agbata, Tochukwu, MD   4 Units at 11/13/19 1222  . montelukast (SINGULAIR) tablet 10 mg  10 mg Oral QHS Agbata, Tochukwu, MD   10 mg at 11/12/19 2033  . morphine 2 MG/ML injection 2 mg  2 mg Intravenous Q4H PRN Agbata, Tochukwu, MD      . multivitamin-lutein (OCUVITE-LUTEIN) capsule 1 capsule  1 capsule Oral BID Agbata, Tochukwu, MD   1 capsule at 11/13/19 0838  . ondansetron (ZOFRAN) tablet 4 mg  4 mg Oral Q6H PRN Agbata, Tochukwu, MD       Or  . ondansetron (ZOFRAN) injection 4 mg  4 mg Intravenous Q6H PRN Agbata, Tochukwu, MD      . oxybutynin (DITROPAN) tablet 5 mg  5 mg Oral Q8H PRN Agbata, Tochukwu, MD   5 mg at 11/13/19 0839  . polyethylene glycol (MIRALAX / GLYCOLAX) packet 17 g  17 g Oral Daily PRN Agbata, Tochukwu, MD      . polyvinyl alcohol (LIQUIFILM TEARS) 1.4 % ophthalmic solution 1 drop  1 drop  Both Eyes TID PRN Agbata, Tochukwu, MD      . rosuvastatin (CRESTOR) tablet 20 mg  20 mg Oral QPM Agbata, Tochukwu, MD   20 mg at 11/12/19 1855  . tamsulosin (FLOMAX) capsule 0.4 mg  0.4 mg Oral Daily Agbata, Tochukwu, MD   0.4 mg at 11/13/19 0838  . [START ON 11/16/2019] Vitamin D (Ergocalciferol) (DRISDOL) capsule 50,000 Units  50,000 Units Oral Q Mon Agbata, Tochukwu, MD      . vitamin E capsule 400 Units  400 Units Oral Q14 Days Agbata, Tochukwu, MD   400 Units at 11/12/19 2032     Abtx:  Anti-infectives (From admission, onward)   Start     Dose/Rate Route Frequency Ordered Stop   11/12/19 1515  cefTRIAXone (ROCEPHIN) 1 g in sodium chloride 0.9 % 100 mL IVPB        1 g 200 mL/hr over 30 Minutes Intravenous  Once 11/12/19 1501 11/12/19 1745      REVIEW OF SYSTEMS:  Const:  fever, negative chills, negative weight loss Eyes: negative diplopia or visual changes, negative eye pain ENT: negative coryza, negative sore throat Resp: negative cough, hemoptysis, dyspnea Cards: negative for chest pain, palpitations, lower extremity edema GU:  frequency, dysuria and hematuria GI: Negative for abdominal pain, diarrhea, bleeding, has constipation Skin: negative for rash and pruritus Heme: negative for easy bruising and gum/nose bleeding MS:  back pain and muscle weakness Neurolo:negative for headaches, dizziness, vertigo, memory problems  Psych: negative for feelings of anxiety, depression  Endocrine: negative for thyroid, diabetes Allergy/Immunology- negative for any medication allergies ?  Objective:  VITALS:  BP 109/65 (BP Location: Right Arm)   Pulse 83   Temp 98.2 F (36.8 C)   Resp 19   Ht 6' (1.829 m)   Wt 127.2 kg   SpO2 93%   BMI 38.04 kg/m  PHYSICAL EXAM:  General: Alert, cooperative, no distress, > BMI Head: Normocephalic, without obvious abnormality, atraumatic. Eyes: Conjunctivae clear, anicteric sclerae. Pupils are equal ENT Nares normal. No drainage or sinus  tenderness. Lips,  mucosa, and tongue normal. No Thrush Neck: Supple, symmetrical, no adenopathy, thyroid: non tender no carotid bruit and no JVD. Back: left CVA tenderness. Lungs: b/l air entry. Heart: irrgeular Abdomen: Soft,  distended. Bowel sounds normal. No masses Extremities: atraumatic, no cyanosis. No edema. No clubbing Skin: No rashes or lesions. Or bruising Lymph: Cervical, supraclavicular normal. Neurologic: Grossly non-focal Pertinent Labs Lab Results CBC    Component Value Date/Time   WBC 7.8 11/13/2019 0602   RBC 3.42 (L) 11/13/2019 0602   HGB 10.1 (L) 11/13/2019 0602   HCT 30.9 (L) 11/13/2019 0602   PLT 190 11/13/2019 0602   MCV 90.4 11/13/2019 0602   MCH 29.5 11/13/2019 0602   MCHC 32.7 11/13/2019 0602   RDW 16.3 (H) 11/13/2019 0602   LYMPHSABS 1.4 04/10/2019 1339   MONOABS 0.8 04/10/2019 1339   EOSABS 0.1 04/10/2019 1339   BASOSABS 0.0 04/10/2019 1339    CMP Latest Ref Rng & Units 11/13/2019 11/12/2019 04/10/2019  Glucose 70 - 99 mg/dL 162(H) 170(H) 204(H)  BUN 8 - 23 mg/dL 55(H) 47(H) 28(H)  Creatinine 0.61 - 1.24 mg/dL 2.94(H) 2.87(H) 1.58(H)  Sodium 135 - 145 mmol/L 131(L) 131(L) 137  Potassium 3.5 - 5.1 mmol/L 3.7 4.1 4.0  Chloride 98 - 111 mmol/L 96(L) 94(L) 98  CO2 22 - 32 mmol/L 25 24 27   Calcium 8.9 - 10.3 mg/dL 7.9(L) 8.3(L) 9.3  Total Protein 6.5 - 8.1 g/dL - - 7.7  Total Bilirubin 0.3 - 1.2 mg/dL - - 1.0  Alkaline Phos 38 - 126 U/L - - 59  AST 15 - 41 U/L - - 21  ALT 0 - 44 U/L - - 26      Microbiology: Recent Results (from the past 240 hour(s))  CULTURE, URINE COMPREHENSIVE     Status: None (Preliminary result)   Collection Time: 11/11/19 12:52 PM   Specimen: Urine   UR  Result Value Ref Range Status   Urine Culture, Comprehensive Preliminary report  Preliminary   Organism ID, Bacteria Comment  Preliminary    Comment: Growth insufficient.  Culture reincubated.  Microscopic Examination     Status: Abnormal   Collection Time:  11/11/19 12:52 PM   Urine  Result Value Ref Range Status   WBC, UA 6-10 (A) 0 - 5 /hpf Final   RBC 0-2 0 - 2 /hpf Final   Epithelial Cells (non renal) 0-10 0 - 10 /hpf Final   Casts Present (A) None seen /lpf Final   Cast Type Granular casts (A) N/A Final   Crystals Present (A) N/A Final   Crystal Type Amorphous Sediment N/A Final   Bacteria, UA Few None seen/Few Final  SARS Coronavirus 2 by RT PCR (hospital order, performed in [place] hospital lab) Nasopharyngeal Nasopharyngeal Swab     Status: None   Collection Time: 11/12/19  3:50 PM   Specimen: Nasopharyngeal Swab  Result Value Ref Range Status   SARS Coronavirus 2 NEGATIVE NEGATIVE Final    Comment: (NOTE) SARS-CoV-2 target nucleic acids are NOT DETECTED.  The SARS-CoV-2 RNA is generally detectable in upper and lower respiratory specimens during the acute phase of infection. The lowest concentration of SARS-CoV-2 viral copies this assay can detect is 250 copies / mL. A negative result does not preclude SARS-CoV-2 infection and should not be used as the sole basis for treatment or other patient management decisions.  A negative result may occur with improper specimen collection / handling, submission of specimen other than nasopharyngeal swab, presence of viral  mutation(s) within the areas targeted by this assay, and inadequate number of viral copies (<250 copies / mL). A negative result must be combined with clinical observations, patient history, and epidemiological information.  Fact Sheet for Patients:   StrictlyIdeas.no  Fact Sheet for Healthcare Providers: BankingDealers.co.za  This test is not yet approved or  cleared by the [place] FDA and has been authorized for detection and/or diagnosis of SARS-CoV-2 by FDA under an Emergency Use Authorization (EUA).  This EUA will remain in effect (meaning this test can be used) for the duration of the COVID-19  declaration under Section 564(b)(1) of the Act, 21 U.S.C. section 360bbb-3(b)(1), unless the authorization is terminated or revoked sooner.  Performed at [facility], [address] [place], [place] [address]     IMAGING RESULTS:  I have personally reviewed the films ?Interval migration of a 15 mm calculus into the distal left ureter with significant hydronephrosis identified. Perinephric stranding is noted as well suspicious for underlying superimposed infection.  Multiple large calculi within the left kidney the largest of which measures 4.3 cm in greatest dimension.   Impression/Recommendation ? ?left kidney stone - chronic has had multiple lithotripsy, stents Chronic hydronephrosis Fever with flank pain with stone in the ureter   Coag neg staph ( epi/hominis) in the urine in the past month - these are skin bacteria and because of previous stents and procedures the stone is likely colonized with these bacteria. Difficult to eradiate Send blood culture and start cefazolin percutaneous nephrostomy is being considered   Will need  nephrectomy in the long run especially with atrophic  kidney and chronic hydronephrosis and recurrent infection due to large stone Pt is more amenable to that now  CKD    CAD s/p stent Afib on  Chronic anemia  ___________________________________________________ Discussed with patient, requesting provider Will follow him peripherally this weekend

## 2019-11-13 NOTE — Consult Note (Signed)
Pharmacy Antibiotic Note  [name] is a 69 y.o. male admitted on 11/12/2019 with UTI.  Pharmacy has been consulted for ancef dosing.  Plan: Ancef 1 g q12H due to renal function CrCl  < 50.   Height: 6' (182.9 cm) Weight: 127.2 kg (280 lb 8 oz) IBW/kg (Calculated) : 77.6  Temp (24hrs), Avg:98.8 F (37.1 C), Min:98.2 F (36.8 C), Max:99.3 F (37.4 C)  Recent Labs  Lab 11/12/19 1021 11/13/19 0602  WBC 10.9* 7.8  CREATININE 2.87* 2.94*    Estimated Creatinine Clearance: 33.6 mL/min (A) (by C-G formula based on SCr of 2.94 mg/dL (H)).    Allergies  Allergen Reactions  . Other Hives    Blue cheese    Dose adjustments this admission: None  Microbiology results: 6/11 BCx: pending 6/11 UCx: pending   Thank you for allowing pharmacy to be a part of this patient's care.  Oswald Hillock, PharmD, BCPS 11/13/2019 1:27 PM

## 2019-11-13 NOTE — Progress Notes (Signed)
Urology Inpatient Progress Note  Subjective: [name] is a 69 y.o. male with an extensive urologic history including severe left renal atrophy, chronic left hydronephrosis, CKD, and recurrent nephrolithiasis with extensive left-sided stone burden recently undergoing outpatient management for stone episode versus UTI admitted on 11/12/2019 with acute on chronic renal insufficiency secondary to a 10 x 15 mm distal left ureteral stone with plans for conservative management.  Holding Eliquis since 11/12/2019.  [name] afebrile, VSS today.  Creatinine up today, 2.94.  WBC count down today, 7.8.  Urine culture pending, on antibiotics as below.  Today, patient reports continued left flank pain, no acute distress.  Anti-infectives: Anti-infectives (From admission, onward)   Start     Dose/Rate Route Frequency Ordered Stop   11/12/19 1515  cefTRIAXone (ROCEPHIN) 1 g in sodium chloride 0.9 % 100 mL IVPB        1 g 200 mL/hr over 30 Minutes Intravenous  Once 11/12/19 1501 11/12/19 1745      Current Facility-Administered Medications  Medication Dose Route Frequency Provider Last Rate Last Admin  . 0.9 %  sodium chloride infusion   Intravenous Continuous Agbata, Tochukwu, MD 100 mL/hr at 11/13/19 0243 100 mL/hr at 11/13/19 0243  . amiodarone (PACERONE) tablet 200 mg  200 mg Oral Daily Agbata, Tochukwu, MD   200 mg at 11/13/19 0838  . ascorbic acid (VITAMIN C) tablet 1,000 mg  1,000 mg Oral Daily Agbata, Tochukwu, MD   1,000 mg at 11/13/19 0838  . baclofen (LIORESAL) tablet 10 mg  10 mg Oral QPM Agbata, Tochukwu, MD   10 mg at 11/12/19 2034  . clonazePAM (KLONOPIN) tablet 1 mg  1 mg Oral QHS Agbata, Tochukwu, MD   1 mg at 11/12/19 2032  . HYDROcodone-acetaminophen (NORCO/VICODIN) 5-325 MG per tablet 1 tablet  1 tablet Oral Q6H PRN Agbata, Tochukwu, MD   1 tablet at 11/13/19 0839  . insulin aspart (novoLOG) injection 0-20 Units  0-20 Units Subcutaneous TID WC Agbata, Tochukwu, MD   4 Units at  11/13/19 0837  . montelukast (SINGULAIR) tablet 10 mg  10 mg Oral QHS Agbata, Tochukwu, MD   10 mg at 11/12/19 2033  . morphine 2 MG/ML injection 2 mg  2 mg Intravenous Q4H PRN Agbata, Tochukwu, MD      . multivitamin (RENA-VIT) tablet 1 tablet  1 tablet Oral QPM Agbata, Tochukwu, MD      . multivitamin-lutein (OCUVITE-LUTEIN) capsule 1 capsule  1 capsule Oral BID Agbata, Tochukwu, MD   1 capsule at 11/13/19 0838  . ondansetron (ZOFRAN) tablet 4 mg  4 mg Oral Q6H PRN Agbata, Tochukwu, MD       Or  . ondansetron (ZOFRAN) injection 4 mg  4 mg Intravenous Q6H PRN Agbata, Tochukwu, MD      . oxybutynin (DITROPAN) tablet 5 mg  5 mg Oral Q8H PRN Agbata, Tochukwu, MD   5 mg at 11/13/19 0839  . polyethylene glycol (MIRALAX / GLYCOLAX) packet 17 g  17 g Oral Daily PRN Agbata, Tochukwu, MD      . polyvinyl alcohol (LIQUIFILM TEARS) 1.4 % ophthalmic solution 1 drop  1 drop Both Eyes TID PRN Agbata, Tochukwu, MD      . rosuvastatin (CRESTOR) tablet 20 mg  20 mg Oral QPM Agbata, Tochukwu, MD   20 mg at 11/12/19 1855  . tamsulosin (FLOMAX) capsule 0.4 mg  0.4 mg Oral Daily Agbata, Tochukwu, MD   0.4 mg at 11/13/19 0838  . [START ON 11/16/2019] Vitamin  D (Ergocalciferol) (DRISDOL) capsule 50,000 Units  50,000 Units Oral Q Mon Agbata, Tochukwu, MD      . vitamin E capsule 400 Units  400 Units Oral Q14 Days Agbata, Tochukwu, MD   400 Units at 11/12/19 2032   Objective: Vital signs in last 24 hours: Temp:  [98.6 F (37 C)-99.3 F (37.4 C)] 99.3 F (37.4 C) (06/11 0823) Pulse Rate:  [79-92] 84 (06/11 0823) Resp:  [16-19] 19 (06/11 0823) BP: (101-134)/(57-64) 122/57 (06/11 0823) SpO2:  [95 %-100 %] 95 % (06/11 0823) Weight:  [125.6 kg-127.2 kg] 127.2 kg (06/11 0159)  Intake/Output from previous day: 06/10 0701 - 06/11 0700 In: 1553.4 [P.O.:360; I.V.:1193.4] Out: 950 [Urine:950] Intake/Output this shift: No intake/output data recorded.  Physical Exam Vitals and nursing note reviewed.  Constitutional:       General: [name] not in acute distress.    Appearance: [name] not ill-appearing, toxic-appearing or diaphoretic.  HENT:     Head: Normocephalic and atraumatic.  Pulmonary:     Effort: Pulmonary effort is normal. No respiratory distress.  Skin:    General: Skin is warm and dry.  Neurological:     Mental Status: [name] alert and oriented to person, place, and time.  Psychiatric:        Mood and Affect: Mood normal.        Behavior: Behavior normal.    Lab Results:  Recent Labs    11/12/19 1021 11/13/19 0602  WBC 10.9* 7.8  HGB 11.0* 10.1*  HCT 33.3* 30.9*  PLT 191 190   BMET Recent Labs    11/12/19 1021 11/13/19 0602  NA 131* 131*  K 4.1 3.7  CL 94* 96*  CO2 24 25  GLUCOSE 170* 162*  BUN 47* 55*  CREATININE 2.87* 2.94*  CALCIUM 8.3* 7.9*   Assessment & Plan: 69 year old comorbid male with extensive urologic history notable for severe chronic left renal atrophy, left hydronephrosis, CKD, and recurrent nephrolithiasis with extensive left-sided stone burden admitted in the setting of recent conservatively managed outpatient acute stone episode versus UTI after a fall with the inability to arise.  He was subsequently found to have acute on chronic renal insufficiency secondary to a 10 x 15 mm distal left ureteral stone and left pyelonephritis.  Creatinine slightly up today, leukocytosis resolved.  We will continue with plans for conservative management at this time.  Recommendations: -Continue to hold Eliquis -Continue daily BMP, CBC -Continue antibiotics and follow urine cultures -If he decompensates or fails to improve, will consider left PCN placement  Debroah Loop, PA-C 11/13/2019

## 2019-11-13 NOTE — Progress Notes (Signed)
PROGRESS NOTE    [name] [facility]  PYK:[id number] DOB: December 11, 1950 DOA: 11/12/2019 PCP: Vidal Schwalbe, MD    Chief Complaint  Patient presents with  . Flank Pain  . Fall    Brief Narrative:  Chief Complaint: Left-sided flank pain   HPI: [name] is a 69 y.o. male with medical history significant for nephrolithiasis, severe chronic left hydronephrosis, atrial fibrillation on chronic anticoagulation therapy, diabetes mellitus with complications of chronic kidney disease who presents to the emergency room for evaluation after he had a fall.  He also complains of worsening left flank pain.  Patient was seen in the urologist office for evaluation of a fever, weakness and anorexia on 11/11/19.  He had a temp as high as 1058F.  He rated his left flank pain a 9 x 10 in intensity at its worst and it is associated with nausea, urinary frequency, urgency and dysuria. He was offered admission which he declined opting to go home on oral antibiotic therapy. He received a dose of Rocephin in the office and was discharged home on Bactrim Patient went home and states that he was very weak and fell, he denies losing consciousness or feeling dizzy or light headed. He continues to have a low-grade temp, T max of 99.58F as well as left flank pain. He denies having any chest pain, shortness of breath, diaphoresis, cough or any changes in his bowel habits. He had a CT scan of abdomen/ pelvis done without contrast which showed interval migration of a 15 mm calculus into the distal left ureter with significant hydronephrosis identified. Perinephric stranding is noted as well suspicious for underlying superimposed infection. Multiple large calculi within the left kidney the largest of which measures 4.3 cm in greatest dimension. Abdominal x-ray showed large colonic stool volume, suggesting constipation.   ED Course: Patient is a 69 year old male who presents to the emergency room for evaluation of left  flank pain which has worsened over the last 24 hours associated with fever.  Patient had a fall at home with no loss of consciousness and attributes this to weakness.  Imaging shows a 15 mm calculus in the distal left ureter with significant hydronephrosis and perinephric stranding.  Patient received a dose of Rocephin in the ER and will be admitted to the hospital for further evaluation.  Subjective: Denies pain, no fever  Assessment & Plan:   Principal Problem:   Acute pyelonephritis Active Problems:   Paroxysmal atrial fibrillation (HCC)   OSA (obstructive sleep apnea)   Hydronephrosis with renal and ureteral calculus obstruction   NICM (nonischemic cardiomyopathy) (HCC)   Chronic kidney disease, stage III (moderate)   Chronic diastolic CHF (congestive heart failure) (HCC)  Recurrent UTI pyelonephritis -With history of recurrent kidney stones and chronic left-sided hydronephrosis,  -He has been treated with several course of antibiotic prior to admission, prior urine culture revealed grew resistant staph epidermidis and staph hominis -Patient presents for evaluation of worsening left flank pain with associated low grade fever -Patient currently vital signs stable, case discussed with infectious disease doctor who recommended get blood culture, antibiotic choice defer to ID -Urology input appreciated recommend medical management for now, urology recommended continue to hold Eliquis in case nephrostomy tube needed  Paroxysmal A. Fib  hold Eliquis since 6/10 Continue amiodarone for rate control  Home meds carvedilol held due to relative hypotension, bp improved some today, will start low dose lopressor with holding parameters  - plan to transition back to coreg if blood pressure allows  Chronic diastolic heart failure Hold furosemide, losartan and carvedilol held on admission due to relative hypotension. 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  Weakness,  he was very weak and fell at home, he denies losing consciousness or feeling dizzy or light headed. We will get PT eval  DVT prophylaxis: scd's Code Status:full Family Communication: patient Disposition:   Status is: Inpatient     Dispo: The patient is from: Home              Anticipated d/c is to: Home              Anticipated d/c date is: TBD, needs urology and ID clearance                Consultants:   Urology  ID  Procedures:   None  Antimicrobials:   Rocephin x1 in the ED     Objective: Vitals:   11/13/19 0336 11/13/19 0823 11/13/19 1201 11/13/19 1623  BP: (!) 110/57 (!) 122/57 109/65 125/66  Pulse: 88 84 83 87  Resp: 19 19 19    Temp: 98.9 F (37.2 C) 99.3 F (37.4 C) 98.2 F (36.8 C) 99 F (37.2 C)  TempSrc:    Oral  SpO2: 97% 95% 93% 97%  Weight:      Height:        Intake/Output Summary (Last 24 hours) at 11/13/2019 1834 Last data filed at 11/13/2019 1512 Gross per 24 hour  Intake 2800.34 ml  Output 1850 ml  Net 950.34 ml   Filed Weights   11/12/19 1019 11/12/19 1748 11/13/19 0159  Weight: 125.6 kg 127.2 kg 127.2 kg    Examination:  General exam: calm, NAD Respiratory system: Clear to auscultation. Respiratory effort normal. Cardiovascular system: S1 & S2 heard, RRR. No JVD, no murmur, No pedal edema. Gastrointestinal system: Protuberant abdomen , soft and nontender. No organomegaly or masses felt. Normal bowel sounds heard. Central nervous system: Alert and oriented. No focal neurological deficits. Extremities: Symmetric 5 x 5 power.  Pitting edema bilateral lower extremity with chronic  venous stasis changes Skin: No rashes, lesions or ulcers Psychiatry: Judgement and insight appear normal. Mood & affect appropriate.     Data Reviewed: I have personally  reviewed following labs and imaging studies  CBC: Recent Labs  Lab 11/12/19 1021 11/13/19 0602  WBC 10.9* 7.8  HGB 11.0* 10.1*  HCT 33.3* 30.9*  MCV 89.5 90.4  PLT 191 606    Basic Metabolic Panel: Recent Labs  Lab 11/12/19 1021 11/13/19 0602  NA 131* 131*  K 4.1 3.7  CL 94* 96*  CO2 24 25  GLUCOSE 170* 162*  BUN 47* 55*  CREATININE 2.87* 2.94*  CALCIUM 8.3* 7.9*    GFR: Estimated Creatinine Clearance: 33.6 mL/min (A) (by C-G formula based on SCr of 2.94 mg/dL (H)).  Liver Function Tests: No results for input(s): AST, ALT, ALKPHOS, BILITOT, PROT, ALBUMIN in the last 168 hours.  CBG: Recent Labs  Lab 11/12/19 2131 11/13/19 0826 11/13/19 1200 11/13/19 1643  GLUCAP 247* 164* 158* 171*     Recent Results (from the past 240 hour(s))  CULTURE, URINE COMPREHENSIVE     Status: None   Collection Time: 11/11/19 12:52 PM   Specimen: Urine   UR  Result Value Ref Range Status   Urine Culture, Comprehensive  Final report  Final   Organism ID, Bacteria Comment  Final    Comment: Mixed urogenital flora 10,000-25,000 colony forming units per mL   Microscopic Examination     Status: Abnormal   Collection Time: 11/11/19 12:52 PM   Urine  Result Value Ref Range Status   WBC, UA 6-10 (A) 0 - 5 /hpf Final   RBC 0-2 0 - 2 /hpf Final   Epithelial Cells (non renal) 0-10 0 - 10 /hpf Final   Casts Present (A) None seen /lpf Final   Cast Type Granular casts (A) N/A Final   Crystals Present (A) N/A Final   Crystal Type Amorphous Sediment N/A Final   Bacteria, UA Few None seen/Few Final  SARS Coronavirus 2 by RT PCR (hospital order, performed in [place] hospital lab) Nasopharyngeal Nasopharyngeal Swab     Status: None   Collection Time: 11/12/19  3:50 PM   Specimen: Nasopharyngeal Swab  Result Value Ref Range Status   SARS Coronavirus 2 NEGATIVE NEGATIVE Final    Comment: (NOTE) SARS-CoV-2 target nucleic acids are NOT DETECTED.  The SARS-CoV-2 RNA is generally  detectable in upper and lower respiratory specimens during the acute phase of infection. The lowest concentration of SARS-CoV-2 viral copies this assay can detect is 250 copies / mL. A negative result does not preclude SARS-CoV-2 infection and should not be used as the sole basis for treatment or other patient management decisions.  A negative result may occur with improper specimen collection / handling, submission of specimen other than nasopharyngeal swab, presence of viral mutation(s) within the areas targeted by this assay, and inadequate number of viral copies (<250 copies / mL). A negative result must be combined with clinical observations, patient history, and epidemiological information.  Fact Sheet for Patients:   StrictlyIdeas.no  Fact Sheet for Healthcare Providers: BankingDealers.co.za  This test is not yet approved or  cleared by the [place] FDA and has been authorized for detection and/or diagnosis of SARS-CoV-2 by FDA under an Emergency Use Authorization (EUA).  This EUA will remain in effect (meaning this test can be used) for the duration of the COVID-19 declaration under Section 564(b)(1) of the Act, 21 U.S.C. section 360bbb-3(b)(1), unless the authorization is terminated or revoked sooner.  Performed at [facility], [address], [place], [place] [address]          Radiology Studies: CT Renal Stone Study  Result Date: 11/12/2019 CLINICAL DATA:  Flank pain and fevers EXAM: CT ABDOMEN AND PELVIS WITHOUT CONTRAST TECHNIQUE: Multidetector CT imaging of the abdomen and pelvis was performed following the standard protocol without IV contrast. COMPARISON:  05/07/2019 FINDINGS: Lower chest: No acute abnormality. Hepatobiliary: No focal liver abnormality is seen. No gallstones, gallbladder wall thickening, or biliary dilatation. Pancreas: Unremarkable. No pancreatic ductal dilatation or surrounding  inflammatory changes. Spleen: Normal in size without focal abnormality. Adrenals/Urinary Tract: Adrenal glands are within normal limits. Right kidney is unremarkable without obstructive change. Left kidney demonstrates hydronephrosis increased from the prior exam with evidence of hydroureter. In the distal left ureter above the ureterovesical junction there is a 15 mm stone identified causing the obstructive change. Multiple calculi are noted within the left kidney the largest of which measures 4.3 cm. Considerable cortical thinning is noted bladder is partially distended. Stomach/Bowel: Scattered diverticular change of the colon is noted without evidence of diverticulitis. The appendix has been surgically removed. Small bowel is unremarkable. No gastric abnormality is seen. Vascular/Lymphatic: Aortic atherosclerosis. No enlarged abdominal or  pelvic lymph nodes. Reproductive: Prostate is unremarkable. Other: No abdominal wall hernia or abnormality. No abdominopelvic ascites. Musculoskeletal: No acute or significant osseous findings. IMPRESSION: Interval migration of a 15 mm calculus into the distal left ureter with significant hydronephrosis identified. Perinephric stranding is noted as well suspicious for underlying superimposed infection. Multiple large calculi within the left kidney the largest of which measures 4.3 cm in greatest dimension. Electronically Signed   By: Inez Catalina M.D.   On: 11/12/2019 14:42        Scheduled Meds: . amiodarone  200 mg Oral Daily  . vitamin C  1,000 mg Oral Daily  . baclofen  10 mg Oral QPM  . clonazePAM  1 mg Oral QHS  . guaiFENesin  600 mg Oral BID  . insulin aspart  0-20 Units Subcutaneous TID WC  . montelukast  10 mg Oral QHS  . multivitamin-lutein  1 capsule Oral BID  . rosuvastatin  20 mg Oral QPM  . tamsulosin  0.4 mg Oral Daily  . [START ON 11/16/2019] Vitamin D (Ergocalciferol)  50,000 Units Oral Q Mon  . vitamin E  400 Units Oral Q14 Days    Continuous Infusions: .  ceFAZolin (ANCEF) IV Stopped (11/13/19 1444)     LOS: 1 day     Time spent: 22mins I have personally reviewed and interpreted on  11/13/2019 daily labs, tele strips, imagings as discussed above under date review session and assessment and plans.  I reviewed all nursing notes, pharmacy notes, consultant notes,  vitals, pertinent old records  I have discussed plan of care as described above with RN , patient on 11/13/2019  Voice Recognition /Dragon dictation system was used to create this note, attempts have been made to correct errors. Please contact the author with questions and/or clarifications.   Florencia Reasons, MD PhD FACP [facility]  Available via Epic secure chat 7am-7pm for nonurgent issues Please page for urgent issues To page the attending provider between 7A-7P or the covering provider during after hours 7P-7A, please log into the web site [email or web address] and access using universal [place] password for that web site. If you do not have the password, please call the hospital operator.    11/13/2019, 6:34 PM

## 2019-11-14 LAB — CBC WITH DIFFERENTIAL/PLATELET
Abs Immature Granulocytes: 0.03 10*3/uL (ref 0.00–0.07)
Basophils Absolute: 0 10*3/uL (ref 0.0–0.1)
Basophils Relative: 0 %
Eosinophils Absolute: 0.1 10*3/uL (ref 0.0–0.5)
Eosinophils Relative: 1 %
HCT: 31.1 % — ABNORMAL LOW (ref 39.0–52.0)
Hemoglobin: 10.1 g/dL — ABNORMAL LOW (ref 13.0–17.0)
Immature Granulocytes: 0 %
Lymphocytes Relative: 9 %
Lymphs Abs: 0.8 10*3/uL (ref 0.7–4.0)
MCH: 29.3 pg (ref 26.0–34.0)
MCHC: 32.5 g/dL (ref 30.0–36.0)
MCV: 90.1 fL (ref 80.0–100.0)
Monocytes Absolute: 1.2 10*3/uL — ABNORMAL HIGH (ref 0.1–1.0)
Monocytes Relative: 13 %
Neutro Abs: 7.2 10*3/uL (ref 1.7–7.7)
Neutrophils Relative %: 77 %
Platelets: 195 10*3/uL (ref 150–400)
RBC: 3.45 MIL/uL — ABNORMAL LOW (ref 4.22–5.81)
RDW: 16.3 % — ABNORMAL HIGH (ref 11.5–15.5)
WBC: 9.3 10*3/uL (ref 4.0–10.5)
nRBC: 0 % (ref 0.0–0.2)

## 2019-11-14 LAB — BASIC METABOLIC PANEL
Anion gap: 12 (ref 5–15)
BUN: 50 mg/dL — ABNORMAL HIGH (ref 8–23)
CO2: 23 mmol/L (ref 22–32)
Calcium: 8.2 mg/dL — ABNORMAL LOW (ref 8.9–10.3)
Chloride: 96 mmol/L — ABNORMAL LOW (ref 98–111)
Creatinine, Ser: 2.57 mg/dL — ABNORMAL HIGH (ref 0.61–1.24)
GFR calc Af Amer: 29 mL/min — ABNORMAL LOW (ref >60)
GFR calc non Af Amer: 25 mL/min — ABNORMAL LOW (ref >60)
Glucose, Bld: 142 mg/dL — ABNORMAL HIGH (ref 70–99)
Potassium: 3.9 mmol/L (ref 3.5–5.1)
Sodium: 131 mmol/L — ABNORMAL LOW (ref 135–145)

## 2019-11-14 LAB — GLUCOSE, CAPILLARY
Glucose-Capillary: 141 mg/dL — ABNORMAL HIGH (ref 70–99)
Glucose-Capillary: 149 mg/dL — ABNORMAL HIGH (ref 70–99)
Glucose-Capillary: 164 mg/dL — ABNORMAL HIGH (ref 70–99)
Glucose-Capillary: 148 mg/dL — ABNORMAL HIGH (ref 70–99)

## 2019-11-14 LAB — URINE CULTURE: Culture: <10000 — AB

## 2019-11-14 MED ORDER — INSULIN GLARGINE 100 UNIT/ML ~~LOC~~ SOLN
5.0000 [IU] | Freq: Every day | SUBCUTANEOUS | Status: DC
  Administered 2019-11-14: 5 [IU] via SUBCUTANEOUS
  Filled 2019-11-14 (×2): qty 0.05

## 2019-11-14 MED ORDER — SENNOSIDES-DOCUSATE SODIUM 8.6-50 MG PO TABS
1.0000 | ORAL_TABLET | Freq: Every day | ORAL | Status: DC
  Administered 2019-11-14 – 2019-11-17 (×4): 1 via ORAL
  Filled 2019-11-14 (×4): qty 1

## 2019-11-14 MED ORDER — CEFAZOLIN SODIUM-DEXTROSE 1-4 GM/50ML-% IV SOLN
1.0000 g | Freq: Three times a day (TID) | INTRAVENOUS | Status: DC
  Administered 2019-11-14 – 2019-11-18 (×12): 1 g via INTRAVENOUS
  Filled 2019-11-14 (×16): qty 50

## 2019-11-14 MED ORDER — GUAIFENESIN ER 600 MG PO TB12: 1200.0000 mg | ORAL_TABLET | Freq: Two times a day (BID) | ORAL | Status: DC

## 2019-11-14 MED ORDER — DM-GUAIFENESIN ER 30-600 MG PO TB12
1.0000 | ORAL_TABLET | Freq: Two times a day (BID) | ORAL | Status: DC
  Administered 2019-11-14 – 2019-11-15 (×2): 1 via ORAL
  Filled 2019-11-14 (×2): qty 1

## 2019-11-14 NOTE — Progress Notes (Addendum)
PROGRESS NOTE    [name] [facility]  KGU:[id number] DOB: June 24, 1950 DOA: 11/12/2019 PCP: Vidal Schwalbe, MD    Chief Complaint  Patient presents with  . Flank Pain  . Fall    Brief Narrative:  Chief Complaint: Left-sided flank pain   HPI: [name] is a 69 y.o. male with medical history significant for nephrolithiasis, severe chronic left hydronephrosis, atrial fibrillation on chronic anticoagulation therapy, diabetes mellitus with complications of chronic kidney disease who presents to the emergency room for evaluation after he had a fall.  He also complains of worsening left flank pain.  Patient was seen in the urologist office for evaluation of a fever, weakness and anorexia on 11/11/19.  He had a temp as high as 1024F.  He rated his left flank pain a 9 x 10 in intensity at its worst and it is associated with nausea, urinary frequency, urgency and dysuria. He was offered admission which he declined opting to go home on oral antibiotic therapy. He received a dose of Rocephin in the office and was discharged home on Bactrim Patient went home and states that he was very weak and fell, he denies losing consciousness or feeling dizzy or light headed. He continues to have a low-grade temp, T max of 99.24F as well as left flank pain. He denies having any chest pain, shortness of breath, diaphoresis, cough or any changes in his bowel habits. He had a CT scan of abdomen/ pelvis done without contrast which showed interval migration of a 15 mm calculus into the distal left ureter with significant hydronephrosis identified. Perinephric stranding is noted as well suspicious for underlying superimposed infection. Multiple large calculi within the left kidney the largest of which measures 4.3 cm in greatest dimension. Abdominal x-ray showed large colonic stool volume, suggesting constipation.   ED Course: Patient is a 69 year old male who presents to the emergency room for evaluation of left  flank pain which has worsened over the last 24 hours associated with fever.  Patient had a fall at home with no loss of consciousness and attributes this to weakness.  Imaging shows a 15 mm calculus in the distal left ureter with significant hydronephrosis and perinephric stranding.  Patient received a dose of Rocephin in the ER and will be admitted to the hospital for further evaluation.  Subjective: No appetite, still have pain in left flank, no fever  Assessment & Plan:   Principal Problem:   Acute pyelonephritis Active Problems:   Paroxysmal atrial fibrillation (HCC)   OSA (obstructive sleep apnea)   Hydronephrosis with renal and ureteral calculus obstruction   NICM (nonischemic cardiomyopathy) (HCC)   Chronic kidney disease, stage III (moderate)   Chronic diastolic CHF (congestive heart failure) (HCC)  Recurrent UTI pyelonephritis,  -With history of recurrent kidney stones and chronic left-sided hydronephrosis,  -He has been treated with several course of antibiotic prior to admission, prior urine culture revealed grew resistant staph epidermidis and staph hominis -Patient presents for evaluation of worsening left flank pain with associated low grade fever -Patient currently vital signs stable, case discussed with infectious disease doctor who recommended get blood culture, ID started on ancef -Urology input appreciated recommend medical management for now, urology recommended continue to hold Eliquis in case nephrostomy tube needed  AKI on CKDIIIb, anemia of chronic disease Due to above Renal dosing meds   Paroxysmal A. Fib hold Eliquis since 6/10 Continue amiodarone for rate control  Home meds carvedilol held due to relative hypotension, bp improved some today,  will start low dose lopressor with holding parameters  - plan to transition back to coreg if blood pressure allows   Chronic diastolic heart failure Hold furosemide, losartan and carvedilol held on admission due to  relative hypotension. 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  Weakness,  he was very weak and fell at home, he denies losing consciousness or feeling dizzy or light headed.  PT eval ordered, pending  DVT prophylaxis: scd's Code Status:full Family Communication: patient Disposition:   Status is: Inpatient     Dispo: The patient is from: Home              Anticipated d/c is to: Home              Anticipated d/c date is: TBD, needs urology and ID clearance                Consultants:   Urology  ID  Procedures:   None  Antimicrobials:   Rocephin x1 in the ED Ancef since June 11    Objective: Vitals:   11/13/19 1942 11/14/19 0455 11/14/19 0647 11/14/19 0718  BP: 131/62 (!) 158/81  136/64  Pulse: 96 (!) 103  91  Resp: 18 17  17   Temp: 98.2 F (36.8 C) 98.6 F (37 C)  98.7 F (37.1 C)  TempSrc: Oral Oral  Oral  SpO2: 93% 94%  99%  Weight:   127.5 kg   Height:        Intake/Output Summary (Last 24 hours) at 11/14/2019 1019 Last data filed at 11/14/2019 0718 Gross per 24 hour  Intake 1246.96 ml  Output 2100 ml  Net -853.04 ml   Filed Weights   11/12/19 1748 11/13/19 0159 11/14/19 0647  Weight: 127.2 kg 127.2 kg 127.5 kg    Examination:  General exam: calm, NAD Respiratory system: Clear to auscultation. Respiratory effort normal. Cardiovascular system: S1 & S2 heard, RRR. No JVD, no murmur, No pedal edema. Gastrointestinal system: Protuberant abdomen , soft and nontender. No  organomegaly or masses felt. Normal bowel sounds heard. Central nervous system: Alert and oriented. No focal neurological deficits. Extremities: Symmetric 5 x 5 power.  Pitting edema bilateral lower extremity with chronic  venous stasis changes Skin: No rashes, lesions or ulcers Psychiatry: Judgement and insight appear normal. Mood & affect appropriate.     Data Reviewed: I have personally reviewed following labs and imaging studies  CBC: Recent Labs  Lab 11/12/19 1021 11/13/19 0602 11/14/19 0446  WBC 10.9* 7.8 9.3  NEUTROABS  --   --  7.2  HGB 11.0* 10.1* 10.1*  HCT 33.3* 30.9* 31.1*  MCV 89.5 90.4 90.1  PLT 191 190 741    Basic Metabolic Panel: Recent Labs  Lab 11/12/19 1021 11/13/19 0602 11/14/19 0446  NA 131* 131* 131*  K 4.1 3.7 3.9  CL 94* 96* 96*  CO2 24 25 23   GLUCOSE 170* 162* 142*  BUN 47* 55* 50*  CREATININE 2.87* 2.94* 2.57*  CALCIUM 8.3* 7.9* 8.2*    GFR: Estimated Creatinine Clearance: 38.5 mL/min (A) (by  C-G formula based on SCr of 2.57 mg/dL (H)).  Liver Function Tests: No results for input(s): AST, ALT, ALKPHOS, BILITOT, PROT, ALBUMIN in the last 168 hours.  CBG: Recent Labs  Lab 11/13/19 0826 11/13/19 1200 11/13/19 1643 11/13/19 2059 11/14/19 0718  GLUCAP 164* 158* 171* 161* 141*     Recent Results (from the past 240 hour(s))  CULTURE, URINE COMPREHENSIVE     Status: None   Collection Time: 11/11/19 12:52 PM   Specimen: Urine   UR  Result Value Ref Range Status   Urine Culture, Comprehensive Final report  Final   Organism ID, Bacteria Comment  Final    Comment: Mixed urogenital flora 10,000-25,000 colony forming units per mL   Microscopic Examination     Status: Abnormal   Collection Time: 11/11/19 12:52 PM   Urine  Result Value Ref Range Status   WBC, UA 6-10 (A) 0 - 5 /hpf Final   RBC 0-2 0 - 2 /hpf Final   Epithelial Cells (non renal) 0-10 0 - 10 /hpf Final   Casts Present (A) None seen /lpf Final   Cast Type Granular  casts (A) N/A Final   Crystals Present (A) N/A Final   Crystal Type Amorphous Sediment N/A Final   Bacteria, UA Few None seen/Few Final  SARS Coronavirus 2 by RT PCR (hospital order, performed in [facility] hospital lab) Nasopharyngeal Nasopharyngeal Swab     Status: None   Collection Time: 11/12/19  3:50 PM   Specimen: Nasopharyngeal Swab  Result Value Ref Range Status   SARS Coronavirus 2 NEGATIVE NEGATIVE Final    Comment: (NOTE) SARS-CoV-2 target nucleic acids are NOT DETECTED.  The SARS-CoV-2 RNA is generally detectable in upper and lower respiratory specimens during the acute phase of infection. The lowest concentration of SARS-CoV-2 viral copies this assay can detect is 250 copies / mL. A negative result does not preclude SARS-CoV-2 infection and should not be used as the sole basis for treatment or other patient management decisions.  A negative result may occur with improper specimen collection / handling, submission of specimen other than nasopharyngeal swab, presence of viral mutation(s) within the areas targeted by this assay, and inadequate number of viral copies (<250 copies / mL). A negative result must be combined with clinical observations, patient history, and epidemiological information.  Fact Sheet for Patients:   StrictlyIdeas.no  Fact Sheet for Healthcare Providers: BankingDealers.co.za  This test is not yet approved or  cleared by the [place] FDA and has been authorized for detection and/or diagnosis of SARS-CoV-2 by FDA under an Emergency Use Authorization (EUA).  This EUA will remain in effect (meaning this test can be used) for the duration of the COVID-19 declaration under Section 564(b)(1) of the Act, 21 U.S.C. section 360bbb-3(b)(1), unless the authorization is terminated or revoked sooner.  Performed at [facility], [address] [place], [place] [address]   Urine Culture      Status: Abnormal   Collection Time: 11/13/19  8:42 AM   Specimen: Urine, Random  Result Value Ref Range Status   Specimen Description   Final    URINE, RANDOM Performed at [facility], [address] [place], [place] [address]    Special Requests   Final    NONE Performed at [facility], [address] [place], [place] [address]    Culture (A)  Final    <10,000 COLONIES/mL INSIGNIFICANT GROWTH Performed at [facility], [place],  [place] [address]    Report Status 11/14/2019 FINAL  Final  CULTURE, BLOOD (ROUTINE X 2) w Reflex to ID Panel     Status: None (Preliminary result)   Collection Time: 11/13/19 10:52 AM   Specimen: BLOOD  Result Value Ref Range Status   Specimen Description BLOOD RIGHT ANTECUBITAL  Final   Special Requests   Final    BOTTLES DRAWN AEROBIC AND ANAEROBIC Blood Culture results may not be optimal due to an excessive volume of blood received in culture bottles   Culture   Final    NO GROWTH < 24 HOURS Performed at [facility], [address] [place], [place] [address]    Report Status PENDING  Incomplete  CULTURE, BLOOD (ROUTINE X 2) w Reflex to ID Panel     Status: None (Preliminary result)   Collection Time: 11/13/19 11:00 AM   Specimen: BLOOD  Result Value Ref Range Status   Specimen Description BLOOD BLOOD RIGHT HAND  Final   Special Requests   Final    BOTTLES DRAWN AEROBIC AND ANAEROBIC Blood Culture adequate volume   Culture   Final    NO GROWTH < 24 HOURS Performed at [facility], [address], [place],  [address]    Report Status PENDING  Incomplete         Radiology Studies: CT Renal Stone Study  Result Date: 11/12/2019 CLINICAL DATA:  Flank pain and fevers EXAM: CT ABDOMEN AND PELVIS WITHOUT CONTRAST TECHNIQUE: Multidetector CT imaging of the abdomen and pelvis was performed following the standard protocol without IV contrast. COMPARISON:  05/07/2019 FINDINGS:  Lower chest: No acute abnormality. Hepatobiliary: No focal liver abnormality is seen. No gallstones, gallbladder wall thickening, or biliary dilatation. Pancreas: Unremarkable. No pancreatic ductal dilatation or surrounding inflammatory changes. Spleen: Normal in size without focal abnormality. Adrenals/Urinary Tract: Adrenal glands are within normal limits. Right kidney is unremarkable without obstructive change. Left kidney demonstrates hydronephrosis increased from the prior exam with evidence of hydroureter. In the distal left ureter above the ureterovesical junction there is a 15 mm stone identified causing the obstructive change. Multiple calculi are noted within the left kidney the largest of which measures 4.3 cm. Considerable cortical thinning is noted bladder is partially distended. Stomach/Bowel: Scattered diverticular change of the colon is noted without evidence of diverticulitis. The appendix has been surgically removed. Small bowel is unremarkable. No gastric abnormality is seen. Vascular/Lymphatic: Aortic atherosclerosis. No enlarged abdominal or pelvic lymph nodes. Reproductive: Prostate is unremarkable. Other: No abdominal wall hernia or abnormality. No abdominopelvic ascites. Musculoskeletal: No acute or significant osseous findings. IMPRESSION: Interval migration of a 15 mm calculus into the distal left ureter with significant hydronephrosis identified. Perinephric stranding is noted as well suspicious for underlying superimposed infection. Multiple large calculi within the left kidney the largest of which measures 4.3 cm in greatest dimension. Electronically Signed   By: Inez Catalina M.D.   On: 11/12/2019 14:42        Scheduled Meds: . amiodarone  200 mg Oral Daily  . vitamin C  1,000 mg Oral Daily  . baclofen  10 mg Oral QPM  . clonazePAM  1 mg Oral QHS  . guaiFENesin  600 mg Oral BID  . insulin aspart  0-20 Units Subcutaneous TID WC  . metoprolol tartrate  12.5 mg Oral BID  .  montelukast  10 mg Oral QHS  . multivitamin-lutein  1 capsule Oral BID  . rosuvastatin  20 mg Oral QPM  . tamsulosin  0.4 mg Oral Daily  . [START ON 11/16/2019] Vitamin D (Ergocalciferol)  50,000 Units Oral Q Mon  . vitamin E  400 Units Oral Q14 Days   Continuous Infusions: .  ceFAZolin (ANCEF) IV 1 g (11/14/19 0901)     LOS: 2 days     Time spent: 4mins I have personally reviewed and interpreted on  11/14/2019 daily labs, tele strips, imagings as discussed above under date review session and assessment and plans.  I reviewed all nursing notes, pharmacy notes, consultant notes,  vitals, pertinent old records  I have discussed plan of care as described above with RN , patient on 11/14/2019  Voice Recognition /Dragon dictation system was used to create this note, attempts have been made to correct errors. Please contact the author with questions and/or clarifications.   Florencia Reasons, MD PhD FACP [facility]  Available via Epic secure chat 7am-7pm for nonurgent issues Please page for urgent issues To page the attending provider between 7A-7P or the covering provider during after hours 7P-7A, please log into the web site [email or web address] and access using universal [place] password for that web site. If you do not have the password, please call the hospital operator.    11/14/2019, 10:19 AM

## 2019-11-14 NOTE — Progress Notes (Signed)
PT Cancellation Note  Patient Details Name: Rajvir Ernster MRN: [id number] DOB: 1951/02/13   Cancelled Treatment:    Reason Eval/Treat Not Completed: Patient declined, no reason specified;Other (comment) (Patient consult received and reviewed. Patient declined PT for second and third attempt this morning and afternoon. Michela Pitcher he will not move today, maybe tomorrow.  Will attempt again at later time/date as available.)  Janna Arch, PT, DPT   11/14/2019, 1:38 PM

## 2019-11-14 NOTE — Progress Notes (Signed)
PT Cancellation Note  Patient Details Name: [name] MRN: [id number] DOB: 06/26/1950   Cancelled Treatment:    Reason Eval/Treat Not Completed: Patient declined, no reason specified;Other (comment) (Patient consult received and reviewed. Patient declined PT therapy this morning stating he wants his breakfast and then will maybe try later. Will attempt again at later time/date as available.)  Janna Arch, PT, DPT   11/14/2019, 10:30 AM

## 2019-11-14 NOTE — Consult Note (Signed)
Pharmacy Antibiotic Note  [name] is a 69 y.o. male admitted on 11/12/2019 with UTI.  Pharmacy has been consulted for ancef dosing. His renal function continues to improve since admission  Plan: adjust cefazolin dose to 1 gram IV every 8 hours  Height: 6' (182.9 cm) Weight: 127.5 kg (281 lb 1.6 oz) IBW/kg (Calculated) : 77.6  Temp (24hrs), Avg:98.4 F (36.9 C), Min:97.6 F (36.4 C), Max:99 F (37.2 C)  Recent Labs  Lab 11/12/19 1021 11/13/19 0602 11/14/19 0446  WBC 10.9* 7.8 9.3  CREATININE 2.87* 2.94* 2.57*    Estimated Creatinine Clearance: 38.5 mL/min (A) (by C-G formula based on SCr of 2.57 mg/dL (H)).     Microbiology results: 6/11 BCx: NG (pending) 6/11 UCx: insignificant growth  Thank you for allowing pharmacy to be a part of this patient's care.  Dallie Piles, PharmD, BCPS 11/14/2019 1:16 PM

## 2019-11-15 LAB — BASIC METABOLIC PANEL
Anion gap: 10 (ref 5–15)
BUN: 45 mg/dL — ABNORMAL HIGH (ref 8–23)
CO2: 23 mmol/L (ref 22–32)
Calcium: 8.2 mg/dL — ABNORMAL LOW (ref 8.9–10.3)
Chloride: 98 mmol/L (ref 98–111)
Creatinine, Ser: 2.31 mg/dL — ABNORMAL HIGH (ref 0.61–1.24)
GFR calc Af Amer: 33 mL/min — ABNORMAL LOW (ref >60)
GFR calc non Af Amer: 28 mL/min — ABNORMAL LOW (ref >60)
Glucose, Bld: 149 mg/dL — ABNORMAL HIGH (ref 70–99)
Potassium: 4 mmol/L (ref 3.5–5.1)
Sodium: 131 mmol/L — ABNORMAL LOW (ref 135–145)

## 2019-11-15 LAB — GLUCOSE, CAPILLARY
Glucose-Capillary: 123 mg/dL — ABNORMAL HIGH (ref 70–99)
Glucose-Capillary: 144 mg/dL — ABNORMAL HIGH (ref 70–99)
Glucose-Capillary: 167 mg/dL — ABNORMAL HIGH (ref 70–99)
Glucose-Capillary: 176 mg/dL — ABNORMAL HIGH (ref 70–99)

## 2019-11-15 MED ORDER — INSULIN GLARGINE 100 UNIT/ML ~~LOC~~ SOLN
10.0000 [IU] | Freq: Every day | SUBCUTANEOUS | Status: DC
  Administered 2019-11-15 – 2019-11-17 (×3): 10 [IU] via SUBCUTANEOUS
  Filled 2019-11-15 (×5): qty 0.1

## 2019-11-15 MED ORDER — DM-GUAIFENESIN ER 30-600 MG PO TB12
2.0000 | ORAL_TABLET | Freq: Two times a day (BID) | ORAL | Status: DC
  Administered 2019-11-15 – 2019-11-18 (×7): 2 via ORAL
  Filled 2019-11-15 (×6): qty 2

## 2019-11-15 MED ORDER — CARVEDILOL 6.25 MG PO TABS
6.2500 mg | ORAL_TABLET | Freq: Two times a day (BID) | ORAL | Status: DC
  Administered 2019-11-15 – 2019-11-18 (×5): 6.25 mg via ORAL
  Filled 2019-11-15 (×5): qty 1

## 2019-11-15 NOTE — Progress Notes (Signed)
PROGRESS NOTE    [name] [facility]  XNT:[id number] DOB: 02/11/1951 DOA: 11/12/2019 PCP: Vidal Schwalbe, MD    Chief Complaint  Patient presents with  . Flank Pain  . Fall    Brief Narrative:  Chief Complaint: Left-sided flank pain   HPI: [name] is a 69 y.o. male with medical history significant for nephrolithiasis, severe chronic left hydronephrosis, atrial fibrillation on chronic anticoagulation therapy, diabetes mellitus with complications of chronic kidney disease who presents to the emergency room for evaluation after he had a fall.  He also complains of worsening left flank pain.  Patient was seen in the urologist office for evaluation of a fever, weakness and anorexia on 11/11/19.  He had a temp as high as 1020F.  He rated his left flank pain a 9 x 10 in intensity at its worst and it is associated with nausea, urinary frequency, urgency and dysuria. He was offered admission which he declined opting to go home on oral antibiotic therapy. He received a dose of Rocephin in the office and was discharged home on Bactrim Patient went home and states that he was very weak and fell, he denies losing consciousness or feeling dizzy or light headed. He continues to have a low-grade temp, T max of 99.20F as well as left flank pain. He denies having any chest pain, shortness of breath, diaphoresis, cough or any changes in his bowel habits. He had a CT scan of abdomen/ pelvis done without contrast which showed interval migration of a 15 mm calculus into the distal left ureter with significant hydronephrosis identified. Perinephric stranding is noted as well suspicious for underlying superimposed infection. Multiple large calculi within the left kidney the largest of which measures 4.3 cm in greatest dimension. Abdominal x-ray showed large colonic stool volume, suggesting constipation.   ED Course: Patient is a 69 year old male who presents to the emergency room for evaluation of left  flank pain which has worsened over the last 24 hours associated with fever.  Patient had a fall at home with no loss of consciousness and attributes this to weakness.  Imaging shows a 15 mm calculus in the distal left ureter with significant hydronephrosis and perinephric stranding.  Patient received a dose of Rocephin in the ER and will be admitted to the hospital for further evaluation.  Subjective: No appetite, still have pain in left flank, no fever, wbc normalized, cr trending down, on iv abx He did get up and walked with physical therapist this morning  Assessment & Plan:   Principal Problem:   Acute pyelonephritis Active Problems:   Paroxysmal atrial fibrillation (HCC)   OSA (obstructive sleep apnea)   Hydronephrosis with renal and ureteral calculus obstruction   NICM (nonischemic cardiomyopathy) (HCC)   Chronic kidney disease, stage III (moderate)   Chronic diastolic CHF (congestive heart failure) (HCC)  Recurrent UTI pyelonephritis,  -With history of recurrent kidney stones and chronic left-sided hydronephrosis,  -He has been treated with several course of antibiotic prior to admission, prior urine culture revealed grew resistant staph epidermidis and staph hominis -Patient presents for evaluation of worsening left flank pain with associated low grade fever - blood culture no growth, ID started on ancef -Urology input appreciated recommend medical management for now, urology recommended continue to hold Eliquis in case nephrostomy tube needed  AKI on CKDIIIb, anemia of chronic disease Due to above Cr slowly trending down but not at baseline yet Renal dosing meds   Paroxysmal A. Fib hold Eliquis since 6/10 Continue  amiodarone for rate control  Home meds carvedilol held due to relative hypotension,  -transition back to coreg now that blood pressure has improved   Chronic diastolic heart failure Hold furosemide, losartan and carvedilol held on admission due to relative  hypotension. 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  Weakness,  he was very weak and fell at home, he denies losing consciousness or feeling dizzy or light headed.  PT eval ordered recommend home health  DVT prophylaxis: scd's Code Status:full Family Communication: patient Disposition:   Status is: Inpatient   Dispo: The patient is from: Home              Anticipated d/c is to: [facility] with [facility]              Anticipated d/c date is: TBD, needs urology and ID clearance                Consultants:   Urology  ID  Procedures:   None  Antimicrobials:   Rocephin x1 in the ED Ancef since June 11    Objective: Vitals:   11/14/19 1635 11/14/19 1934 11/15/19 0500 11/15/19 0638  BP: (!) 141/69 129/64  138/72  Pulse: 84 87  85  Resp: 17 19  20   Temp: 97.6 F (36.4 C) 98.9 F (37.2 C)  98.7 F (37.1 C)  TempSrc:  Oral  Oral  SpO2: 99% 95%  94%  Weight:   128.8 kg   Height:        Intake/Output Summary (Last 24 hours) at 11/15/2019 0753 Last data filed at 11/15/2019 0135 Gross per 24 hour  Intake 102.7 ml  Output 1300 ml  Net -1197.3 ml   Filed Weights   11/13/19 0159 11/14/19 0647 11/15/19 0500  Weight: 127.2 kg 127.5 kg 128.8 kg    Examination:  General exam: calm, NAD Respiratory system: Clear to auscultation. Respiratory effort normal. Cardiovascular system: S1 & S2 heard, RRR. No JVD, no murmur, No pedal edema. Gastrointestinal system: Protuberant abdomen , soft and  nontender. No organomegaly or masses felt. Normal bowel sounds heard. Central nervous system: Alert and oriented. No focal neurological deficits. Extremities: Symmetric 5 x 5 power.  Pitting edema bilateral lower extremity with chronic  venous stasis changes Skin: No rashes, lesions or ulcers Psychiatry: Judgement and insight appear normal. Mood & affect appropriate.     Data Reviewed: I have personally reviewed following labs and imaging studies  CBC: Recent Labs  Lab 11/12/19 1021 11/13/19 0602 11/14/19 0446  WBC 10.9* 7.8 9.3  NEUTROABS  --   --  7.2  HGB 11.0* 10.1* 10.1*  HCT 33.3* 30.9* 31.1*  MCV 89.5 90.4 90.1  PLT 191 190 478    Basic Metabolic Panel: Recent Labs  Lab 11/12/19 1021 11/13/19 0602 11/14/19 0446 11/15/19 0605  NA 131* 131* 131* 131*  K 4.1 3.7 3.9 4.0  CL 94* 96* 96* 98  CO2 24 25 23 23   GLUCOSE 170* 162* 142* 149*  BUN 47* 55* 50* 45*  CREATININE 2.87* 2.94*  2.57* 2.31*  CALCIUM 8.3* 7.9* 8.2* 8.2*    GFR: Estimated Creatinine Clearance: 43.1 mL/min (A) (by C-G formula based on SCr of 2.31 mg/dL (H)).  Liver Function Tests: No results for input(s): AST, ALT, ALKPHOS, BILITOT, PROT, ALBUMIN in the last 168 hours.  CBG: Recent Labs  Lab 11/13/19 2059 11/14/19 0718 11/14/19 1122 11/14/19 1635 11/14/19 2158  GLUCAP 161* 141* 149* 164* 148*     Recent Results (from the past 240 hour(s))  CULTURE, URINE COMPREHENSIVE     Status: None   Collection Time: 11/11/19 12:52 PM   Specimen: Urine   UR  Result Value Ref Range Status   Urine Culture, Comprehensive Final report  Final   Organism ID, Bacteria Comment  Final    Comment: Mixed urogenital flora 10,000-25,000 colony forming units per mL   Microscopic Examination     Status: Abnormal   Collection Time: 11/11/19 12:52 PM   Urine  Result Value Ref Range Status   WBC, UA 6-10 (A) 0 - 5 /hpf Final   RBC 0-2 0 - 2 /hpf Final   Epithelial Cells (non renal) 0-10 0 - 10 /hpf Final     Casts Present (A) None seen /lpf Final   Cast Type Granular casts (A) N/A Final   Crystals Present (A) N/A Final   Crystal Type Amorphous Sediment N/A Final   Bacteria, UA Few None seen/Few Final  SARS Coronavirus 2 by RT PCR (hospital order, performed in [place] hospital lab) Nasopharyngeal Nasopharyngeal Swab     Status: None   Collection Time: 11/12/19  3:50 PM   Specimen: Nasopharyngeal Swab  Result Value Ref Range Status   SARS Coronavirus 2 NEGATIVE NEGATIVE Final    Comment: (NOTE) SARS-CoV-2 target nucleic acids are NOT DETECTED.  The SARS-CoV-2 RNA is generally detectable in upper and lower respiratory specimens during the acute phase of infection. The lowest concentration of SARS-CoV-2 viral copies this assay can detect is 250 copies / mL. A negative result does not preclude SARS-CoV-2 infection and should not be used as the sole basis for treatment or other patient management decisions.  A negative result may occur with improper specimen collection / handling, submission of specimen other than nasopharyngeal swab, presence of viral mutation(s) within the areas targeted by this assay, and inadequate number of viral copies (<250 copies / mL). A negative result must be combined with clinical observations, patient history, and epidemiological information.  Fact Sheet for Patients:   StrictlyIdeas.no  Fact Sheet for Healthcare Providers: BankingDealers.co.za  This test is not yet approved or  cleared by the [place] FDA and has been authorized for detection and/or diagnosis of SARS-CoV-2 by FDA under an Emergency Use Authorization (EUA).  This EUA will remain in effect (meaning this test can be used) for the duration of the COVID-19 declaration under Section 564(b)(1) of the Act, 21 U.S.C. section 360bbb-3(b)(1), unless the authorization is terminated or revoked sooner.  Performed at [facility], [address] [place], [place] [address]   Urine Culture     Status: Abnormal   Collection Time: 11/13/19  8:42 AM   Specimen: Urine, Random  Result Value Ref Range Status   Specimen Description   Final    URINE, RANDOM Performed at [facility], [address], [place], [place] [address]    Special Requests   Final    NONE Performed at [facility], [address], [place], [place] [address]    Culture (A)  Final    <10,000 COLONIES/mL INSIGNIFICANT GROWTH Performed at [facility] [address], [place], [place] [address]    Report Status 11/14/2019 FINAL  Final  CULTURE, BLOOD (ROUTINE X 2) w Reflex to ID Panel     Status: None (Preliminary result)   Collection Time: 11/13/19 10:52 AM   Specimen: BLOOD  Result Value Ref Range Status   Specimen Description BLOOD RIGHT ANTECUBITAL  Final   Special Requests   Final    BOTTLES DRAWN AEROBIC AND ANAEROBIC Blood Culture results may not be optimal due to an excessive volume of blood received in culture bottles   Culture   Final    NO GROWTH 2 DAYS Performed at [facility], [address] [place], [place] [address]    Report Status PENDING  Incomplete  CULTURE, BLOOD (ROUTINE X 2) w Reflex to ID Panel     Status: None (Preliminary result)   Collection Time: 11/13/19 11:00 AM   Specimen: BLOOD  Result Value Ref Range Status   Specimen Description BLOOD BLOOD RIGHT HAND  Final   Special Requests   Final    BOTTLES DRAWN AEROBIC AND ANAEROBIC Blood Culture adequate volume   Culture   Final    NO GROWTH 2 DAYS Performed at [facility], [address], [place], [place] [address]    Report Status PENDING  Incomplete         Radiology Studies: No results found.      Scheduled Meds: . amiodarone  200 mg Oral Daily  . vitamin C  1,000 mg Oral Daily  . baclofen  10 mg Oral QPM  . clonazePAM  1 mg Oral QHS  . dextromethorphan-guaiFENesin  1 tablet Oral BID  . insulin  aspart  0-20 Units Subcutaneous TID WC  . insulin glargine  5 Units Subcutaneous QHS  . metoprolol tartrate  12.5 mg Oral BID  . montelukast  10 mg Oral QHS  . multivitamin-lutein  1 capsule Oral BID  . rosuvastatin  20 mg Oral QPM  . senna-docusate  1 tablet Oral QHS  . tamsulosin  0.4 mg Oral Daily  . [START ON 11/16/2019] Vitamin D (Ergocalciferol)  50,000 Units Oral Q Mon  . vitamin E  400 Units Oral Q14 Days   Continuous Infusions: .  ceFAZolin (ANCEF) IV 1 g (11/15/19 0557)     LOS: 3 days     Time spent: 2mins I have personally reviewed and interpreted on  11/15/2019 daily labs, tele strips, imagings as discussed above under date review session and assessment and plans.  I reviewed all nursing notes, pharmacy notes, consultant notes,  vitals, pertinent old records  I have discussed plan of care as described above with RN , patient on 11/15/2019  Voice Recognition /Dragon dictation system was used to create this note, attempts have been made to correct errors. Please contact the author with questions and/or clarifications.   Florencia Reasons, MD PhD FACP [facility]  Available via Epic secure chat 7am-7pm for nonurgent issues Please page for urgent issues To page the attending provider between 7A-7P or the covering provider during after hours 7P-7A, please log into the web site [email or web address] and access using universal [place] password for that web site. If you do not have the password, please call the hospital operator.    11/15/2019, 7:53 AM

## 2019-11-15 NOTE — Evaluation (Signed)
Physical Therapy Evaluation Patient Details Name: [name] MRN: [id number] DOB: December 07, 1950 Today's Date: 11/15/2019   History of Present Illness  Patient is a 69 year old male who presented to emergency room s/p fall with worsening left flank pain. Patient was recently seen at urologist office for evaluation of fever, weakness and anorexia with nausea, urinary frequency, urgency, and dysuria; he declined hospitalization and went home where he fell. PMh includes nephrolithiasis, severe chronic left hydronephrosis, atrial fibrillation on chronic anticoagulation therapy, DM with CKD stage 3, cardiomyopathy, psoriasis, sleep apnea, STD, glaucoma. CT scan showed 15 mm calculus in distal left ureter.  Clinical Impression  Pt is a pleasant 69 year old M who was admitted for acute pyelonephritis. Pt performs bed mobility with mod I requiring extra time, transfers with CGA/supervision and ambulation with CGA with RW. Pt performs STSx2 from bedside with CGA prior to ambulating with RW to opposite bedside (to retrieve mask) with CGA; O2 sat measured at 88%, pt requests to take a seated rest break, and is instructed in pursed lip breathing while resting and continue throughout mobility tasks. Pt ambulates to nurses station approximately 125' with RW and supervision, monitoring O2 throughout, decreasing to 86% at end of gait trial; O2 improves again to 97% with brief seated rest break.  Pt reportedly does not use O2 at home without exception of CPAP at night, and required no assistance for ADLs prior to admission, though elected to use AD for ambulating longer distance due to weakness. Recommend DC home with home health. Pt demonstrates deficits with strength, activity tolerance and balance requiring continued skilled PT intervention.     Follow Up Recommendations Home health PT    Equipment Recommendations  Rolling walker with 5" wheels (if pt does not have already have bariatric RW - he describes his as  "heavy duty")    Recommendations for Other Services       Precautions / Restrictions Precautions Precautions: Fall Restrictions Weight Bearing Restrictions: No      Mobility  Bed Mobility Overal bed mobility: Modified Independent             General bed mobility comments: Requiring extra time for supine>sit, electing to use momentum to flex trunk to sit up and using bedrails (recommended log rolling technique but pt refuses, stating his center of gravity doesn't allow for that)  Transfers Overall transfer level: Needs assistance Equipment used: Rolling walker (2 wheeled) Transfers: Sit to/from [facility] Sit to Stand: Min guard;Supervision Stand pivot transfers: Min guard       General transfer comment: STSx2 from bedside, with CGA for safety progressing to supervision with 2nd performance; able to perform stand pivot with CGA without AD, though electing to ambulate with RW for comfort  Ambulation/Gait Ambulation/Gait assistance: Supervision Gait Distance (Feet): 125 Feet Assistive device: Rolling walker (2 wheeled) Gait Pattern/deviations: WFL(Within Functional Limits);Step-through pattern     General Gait Details: Slow steps with pt electing to use RW; demos O2 desat ranging from 85-92 during ambulation trials, though improves with brief seated rest breaks. Pt ambulates in-room to opposite bedside prior to short seated rest break to allow for improved O2 sat (increasing from 88 to 93); second trial pt ambulates approx 125' to nurses station and back to room (with O2 decreasing to 86% at end, and increasing to 97% with seated rest break with pursed lip breathing).  Stairs            Wheelchair Mobility    Modified Rankin (  Stroke Patients Only)       Balance Overall balance assessment: Needs assistance Sitting-balance support: No upper extremity supported Sitting balance-Leahy Scale: Good Sitting balance - Comments: able to sit steady edge  of bed without UE support while slipping feet into shoes   Standing balance support: No upper extremity supported;During functional activity Standing balance-Leahy Scale: Good Standing balance comment: Able to reach outside BOS while standing without UE support                             Pertinent Vitals/Pain Pain Assessment: No/denies pain    Home Living Family/patient expects to be discharged to:: Private residence Living Arrangements: Non-relatives/Friends (lives with "best friend"/"housemate") Available Help at Discharge: Friend(s);Available 24 hours/day Type of Home: House Home Access: Stairs to enter Entrance Stairs-Rails:  (pt states he has a corner post which he is able to use for support going up/down stairs) Entrance Stairs-Number of Steps: 4 Home Layout: One level Home Equipment: Cane - single point;Wheelchair - Insurance claims handler - 4 wheels (pt states he has a "heavy duty" walker - unsure if bariatric but would recommend bariatric RW)      Prior Function Level of Independence: Independent with assistive device(s)         Comments: Pt reports he uses an AD occasionally for ambulation during times he feels weaker or for community/longer distances     Hand Dominance        Extremity/Trunk Assessment   Upper Extremity Assessment Upper Extremity Assessment: Generalized weakness    Lower Extremity Assessment Lower Extremity Assessment: Generalized weakness       Communication   Communication: No difficulties  Cognition Arousal/Alertness: Awake/alert Behavior During Therapy: WFL for tasks assessed/performed Overall Cognitive Status: Within Functional Limits for tasks assessed                                 General Comments: Generally pleasant      General Comments      Exercises     Assessment/Plan    PT Assessment Patient needs continued PT services  PT Problem List Decreased strength;Decreased  mobility;Decreased activity tolerance;Decreased balance       PT Treatment Interventions DME instruction;Therapeutic exercise;Gait training;Balance training;Stair training;Neuromuscular re-education;Patient/family education;Therapeutic activities    PT Goals (Current goals can be found in the Care Plan section)  Acute Rehab PT Goals Patient Stated Goal: to go back home PT Goal Formulation: With patient Time For Goal Achievement: 11/29/19 Potential to Achieve Goals: Good    Frequency Min 2X/week   Barriers to discharge        Co-evaluation               AM-PAC PT "6 Clicks" Mobility  Outcome Measure Help needed turning from your back to your side while in a flat bed without using bedrails?: A Little Help needed moving from lying on your back to sitting on the side of a flat bed without using bedrails?: A Little Help needed moving to and from a bed to a chair (including a wheelchair)?: A Little Help needed standing up from a chair using your arms (e.g., wheelchair or bedside chair)?: A Little Help needed to walk in hospital room?: A Little Help needed climbing 3-5 steps with a railing? : A Little 6 Click Score: 18    End of Session Equipment Utilized During Treatment: Gait belt Activity Tolerance: Patient  tolerated treatment well Patient left: in chair;with call bell/phone within reach;Other (comment) (NT notified pt does not have [place] chair alarm in room) Nurse Communication: Mobility status PT Visit Diagnosis: Unsteadiness on feet (R26.81);History of falling (Z91.81);Muscle weakness (generalized) (M62.81)    Time: 1443-1540 PT Time Calculation (min) (ACUTE ONLY): 42 min   Charges:   PT Evaluation $PT Eval Moderate Complexity: 1 Mod PT Treatments $Therapeutic Activity: 8-22 mins        Petra Kuba, PT, DPT 11/15/19, 11:30 AM

## 2019-11-16 ENCOUNTER — Inpatient Hospital Stay: Payer: Medicare Other

## 2019-11-16 DIAGNOSIS — R509 Fever, unspecified: Secondary | ICD-10-CM

## 2019-11-16 DIAGNOSIS — I428 Other cardiomyopathies: Secondary | ICD-10-CM

## 2019-11-16 DIAGNOSIS — N201 Calculus of ureter: Secondary | ICD-10-CM

## 2019-11-16 DIAGNOSIS — G4733 Obstructive sleep apnea (adult) (pediatric): Secondary | ICD-10-CM

## 2019-11-16 DIAGNOSIS — N1330 Unspecified hydronephrosis: Secondary | ICD-10-CM

## 2019-11-16 DIAGNOSIS — R109 Unspecified abdominal pain: Secondary | ICD-10-CM

## 2019-11-16 DIAGNOSIS — I4891 Unspecified atrial fibrillation: Secondary | ICD-10-CM

## 2019-11-16 DIAGNOSIS — D649 Anemia, unspecified: Secondary | ICD-10-CM

## 2019-11-16 LAB — BASIC METABOLIC PANEL
Anion gap: 10 (ref 5–15)
BUN: 39 mg/dL — ABNORMAL HIGH (ref 8–23)
CO2: 25 mmol/L (ref 22–32)
Calcium: 8.3 mg/dL — ABNORMAL LOW (ref 8.9–10.3)
Chloride: 97 mmol/L — ABNORMAL LOW (ref 98–111)
Creatinine, Ser: 2.24 mg/dL — ABNORMAL HIGH (ref 0.61–1.24)
GFR calc Af Amer: 34 mL/min — ABNORMAL LOW (ref >60)
GFR calc non Af Amer: 29 mL/min — ABNORMAL LOW (ref >60)
Glucose, Bld: 153 mg/dL — ABNORMAL HIGH (ref 70–99)
Potassium: 4.5 mmol/L (ref 3.5–5.1)
Sodium: 132 mmol/L — ABNORMAL LOW (ref 135–145)

## 2019-11-16 LAB — GLUCOSE, CAPILLARY
Glucose-Capillary: 139 mg/dL — ABNORMAL HIGH (ref 70–99)
Glucose-Capillary: 145 mg/dL — ABNORMAL HIGH (ref 70–99)
Glucose-Capillary: 193 mg/dL — ABNORMAL HIGH (ref 70–99)
Glucose-Capillary: 196 mg/dL — ABNORMAL HIGH (ref 70–99)
Glucose-Capillary: 200 mg/dL — ABNORMAL HIGH (ref 70–99)

## 2019-11-16 IMAGING — XA IR IMAGE GUIDED DRAINAGE BY PERCUTANEOUS CATHETER
3 series · 12 of 12 positions shown · non-contrast
Comparison: CT abdomen and pelvis-[DATE]

INDICATION: History of urosepsis secondary to obstructive nephrolithiasis.
Request made for placement of a left-sided percutaneous nephrostomy
catheter for infection source control purposes.

EXAM:
1. ULTRASOUND GUIDANCE FOR PUNCTURE OF THE LEFT RENAL COLLECTING
SYSTEM
2. LEFT PERCUTANEOUS NEPHROSTOMY TUBE PLACEMENT.

[Series 1: fluoroscopy - stored · 4 of 54 frames shown (1 of 3)]
[frame 1/54]
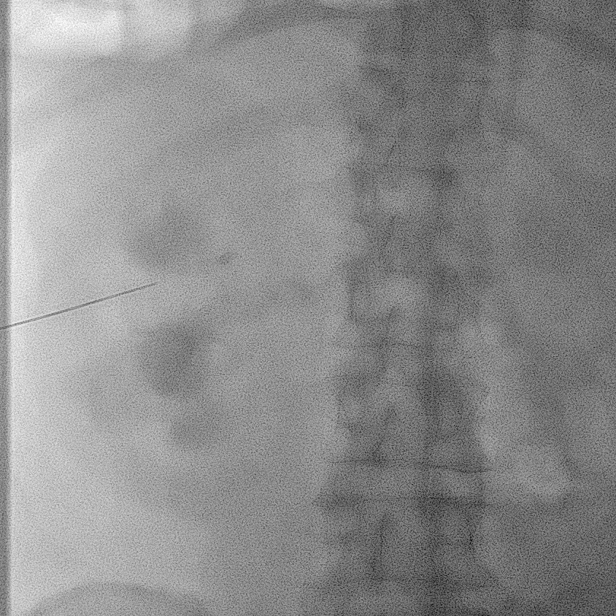
[frame 9/54]
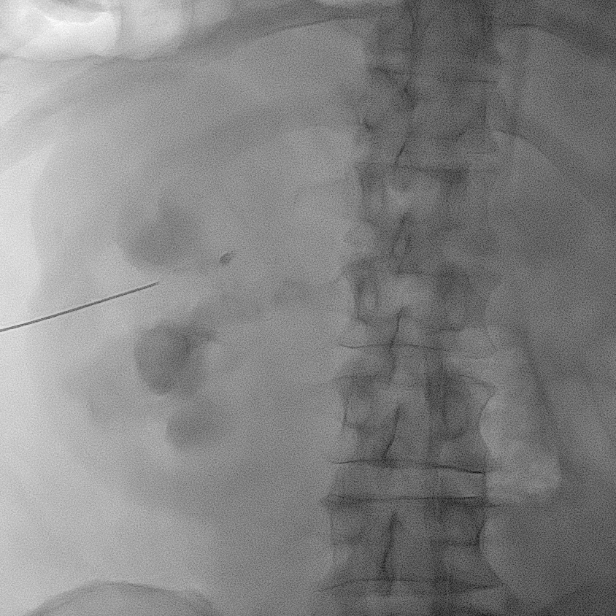
[frame 28/54]
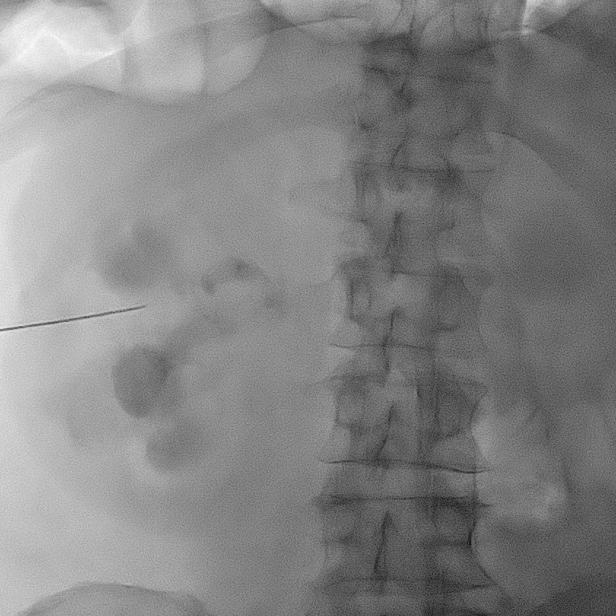
[frame 46/54]
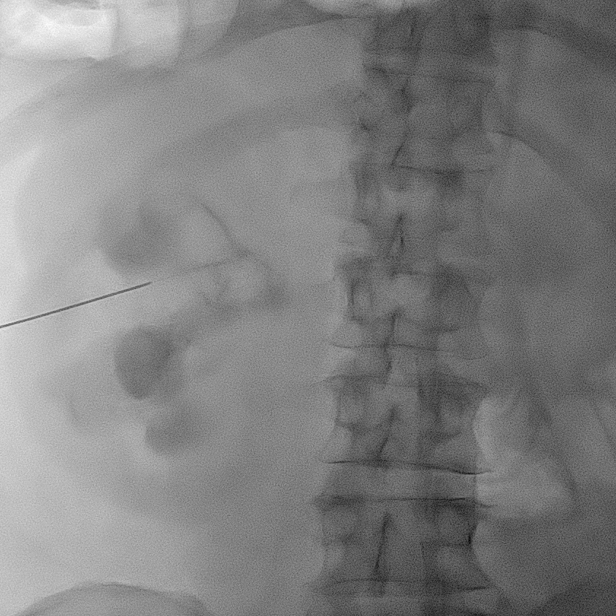

[Series 2: fluoroscopy - stored · 4 of 67 frames shown (2 of 3)]
[frame 1/67]
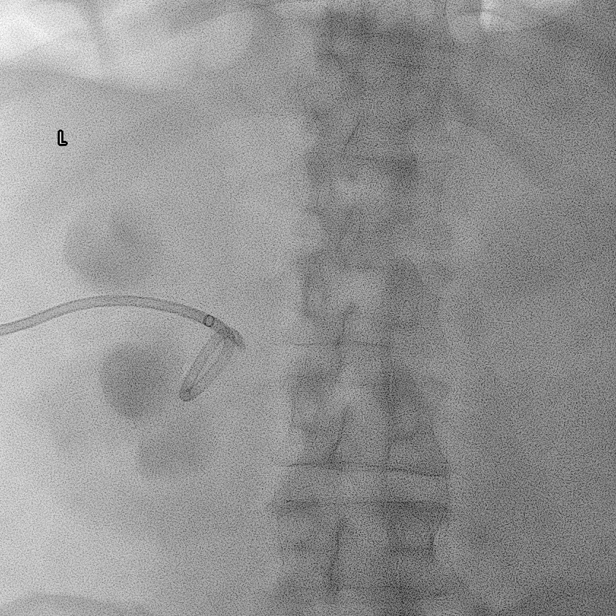
[frame 11/67]
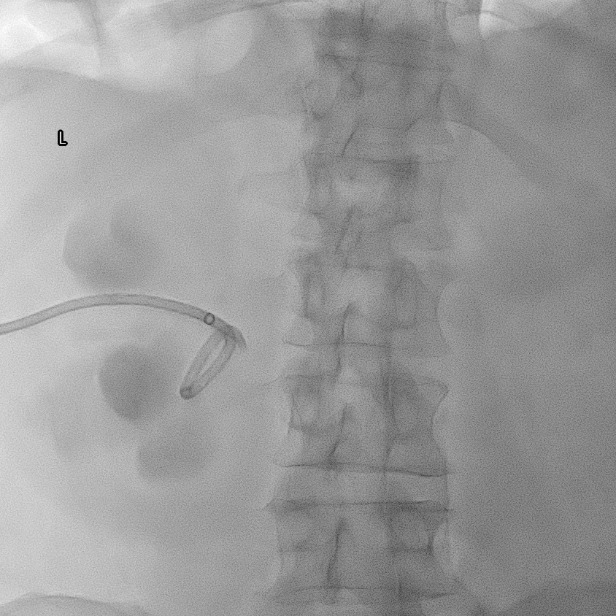
[frame 34/67]
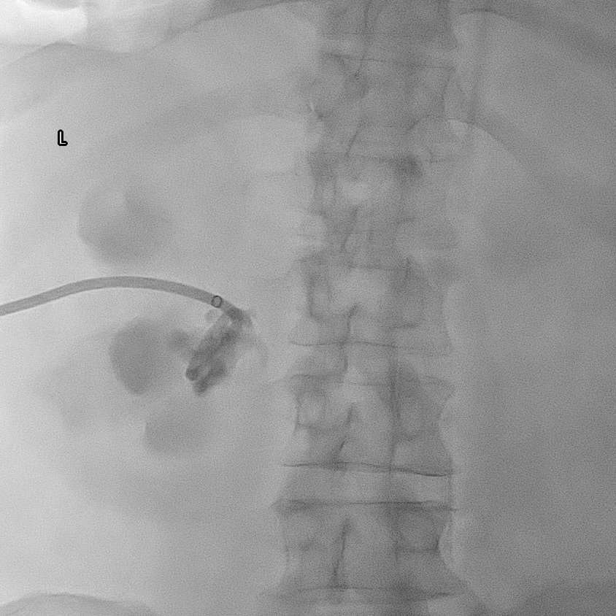
[frame 57/67]
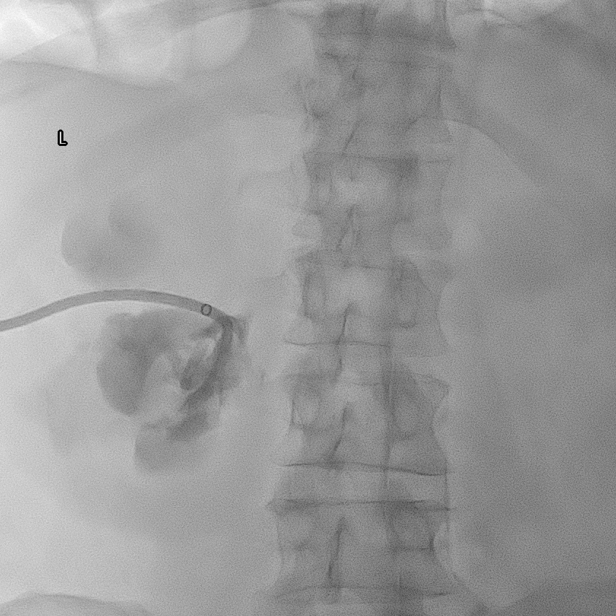

[Series 3: fluoroscopy - stored · 4 of 22 frames shown (3 of 3)]
[frame 1/22]
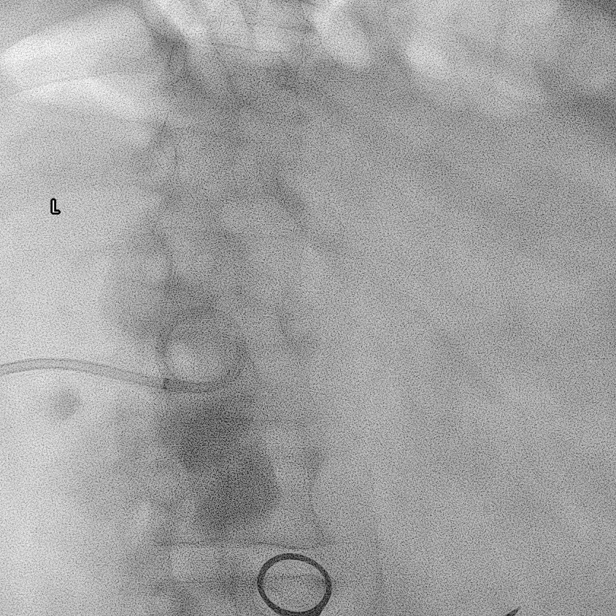
[frame 4/22]
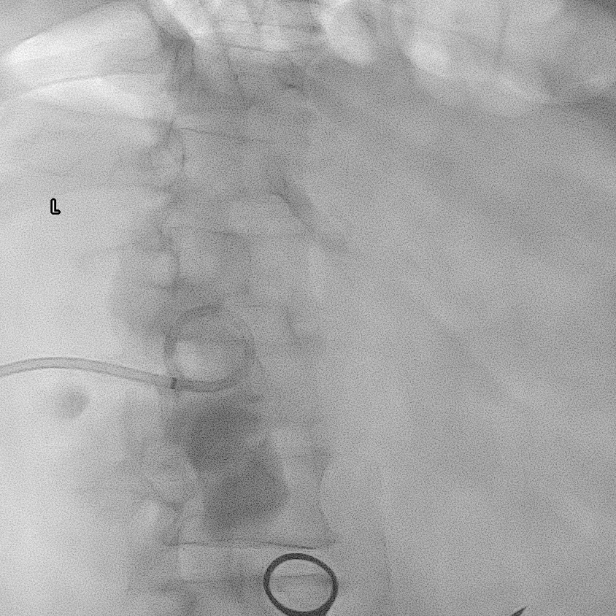
[frame 12/22]
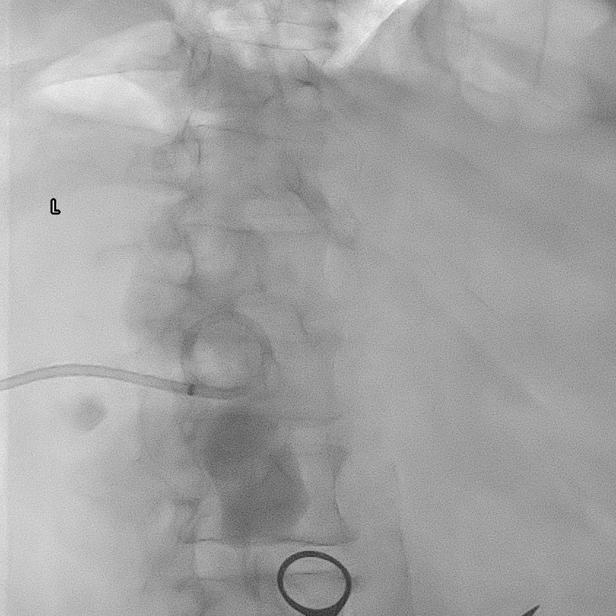
[frame 19/22]
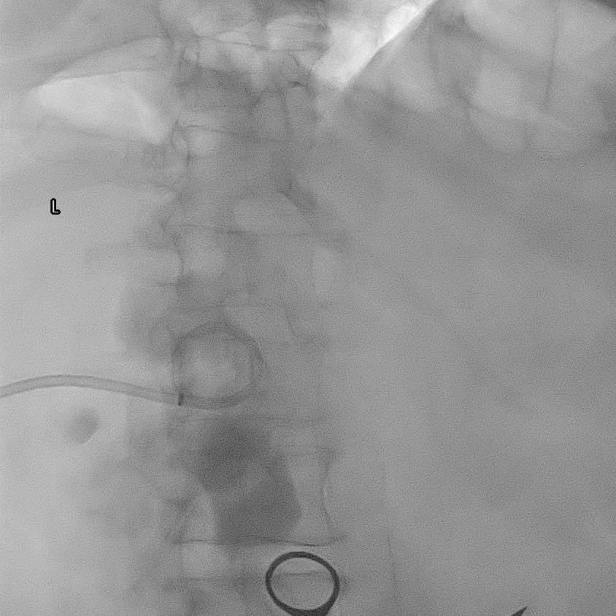

[12 of 12 positions shown; findings below may reference images not displayed]

MEDICATIONS:
Patient is currently admitted to the hospital receiving intravenous
antibiotics; the antibiotic was administered in an appropriate time
frame prior to skin puncture.

ANESTHESIA/SEDATION:
Moderate (conscious) sedation was employed during this procedure. A
total of Versed 1 mg and Fentanyl 50 mcg was administered
intravenously.

Moderate Sedation Time: 16 minutes. The patient's level of
consciousness and vital signs were monitored continuously by
radiology nursing throughout the procedure under my direct
supervision.

CONTRAST:  10 mL Visipaque 320-administered into the renal
collecting system

FLUOROSCOPY TIME:  54 seconds (61.2 mGy)

COMPLICATIONS:
None immediate.

PROCEDURE:
The procedure, risks, benefits, and alternatives were explained to
the patient. Questions regarding the procedure were encouraged and
answered. The patient understands and consents to the procedure. A
timeout was performed prior to the initiation of the procedure.

The left flank region was prepped and draped in the usual sterile
fashion and a sterile drape was applied covering the operative
field. A sterile gown and sterile gloves were used for the
procedure. Local anesthesia was provided with 1% Lidocaine with
epinephrine. Ultrasound was used to localize the left kidney. Under
direct ultrasound guidance, a 20 gauge needle was advanced into the
renal collecting system. An ultrasound image documentation was
performed. Access within the collecting system was confirmed with
the efflux of urine followed by limited contrast injection.

Over a Nitrex wire, the tract was dilated with an Accustick stent.
Next, under intermittent fluoroscopic guidance and over a short
Amplatz wire, the track was dilated ultimately allowing placement of
a 10-French percutaneous nephrostomy catheter which was advanced to
the level of the renal pelvis where the coil was formed and locked.
Contrast was injected and several spot fluoroscopic images were
obtained in various obliquities. The catheter was secured at the
skin with a Prolene retention suture and stat lock device and
connected to a gravity bag was placed.

Next, approximately 500 cc of purulent appearing urine was
aspirated. A representative sample of aspirated urine was capped and
sent to the laboratory for analysis

Dressings were applied. The patient tolerated procedure well without
immediate postprocedural complication.
FINDINGS: Ultrasound scanning demonstrates severe dilatation of the left renal
collecting system with lack of significant identifiable renal cortex
potentially suggestive of very limited function of the left kidney.

Under a combination of ultrasound and fluoroscopic guidance, the
left renal collecting system was successfully accessed and a
10-French percutaneous nephrostomy catheter with end coiled and
locked within the renal pelvis. Contrast injection confirmed
appropriate positioning.

Next, approximately 500 cc of purulent appearing urine was
aspirated.
IMPRESSION: 1. Successful ultrasound and fluoroscopic guided placement of a left
sided 10 French PCN.
2. Approximately 500 cc of purulent appearing urine was aspirated
following PCN placement. A representative sample of aspirated urine
was capped and sent to the laboratory for analysis.
3. Sonographic evaluation demonstrates an extremely limited amount
of identifiable renal cortex as could be seen in the setting of a
nonfunctional kidney. After the resolution of acute symptoms,
further evaluation with nuclear medicine renal scintigraphy could be
performed to evaluate the functionality of the left kidney as
indicated.

## 2019-11-16 MED ORDER — SODIUM CHLORIDE 0.9% FLUSH
5.0000 mL | Freq: Three times a day (TID) | INTRAVENOUS | Status: DC
  Administered 2019-11-16 – 2019-11-18 (×6): 5 mL

## 2019-11-16 MED ORDER — MIDAZOLAM HCL 2 MG/2ML IJ SOLN
INTRAMUSCULAR | Status: AC
  Filled 2019-11-16: qty 2

## 2019-11-16 MED ORDER — FENTANYL CITRATE (PF) 100 MCG/2ML IJ SOLN
INTRAMUSCULAR | Status: AC
  Filled 2019-11-16: qty 2

## 2019-11-16 MED ORDER — MIDAZOLAM HCL 2 MG/2ML IJ SOLN
INTRAMUSCULAR | Status: AC | PRN
  Administered 2019-11-16: 1 mg via INTRAVENOUS

## 2019-11-16 MED ORDER — IODIXANOL 320 MG/ML IV SOLN
50.0000 mL | Freq: Once | INTRAVENOUS | Status: AC | PRN
  Administered 2019-11-16: 10 mL

## 2019-11-16 MED ORDER — SODIUM CHLORIDE 0.9 % IV SOLN
INTRAVENOUS | Status: DC | PRN
  Administered 2019-11-16: 1000 mL via INTRAVENOUS

## 2019-11-16 MED ORDER — FENTANYL CITRATE (PF) 100 MCG/2ML IJ SOLN
INTRAMUSCULAR | Status: AC | PRN
  Administered 2019-11-16: 50 ug via INTRAVENOUS

## 2019-11-16 NOTE — TOC Initial Note (Signed)
Transition of Care [facility]) - Initial/Assessment Note    Patient Details  Name: [name] MRN: [id number] Date of Birth: 1951/04/04  Transition of [facility]) CM/SW Contact:    Victorino Dike, RN Phone Number: 11/16/2019, 10:24 AM  Clinical Narrative:                 Met with patient in room today, recommended HH to him.  [name] agreeable.  [name] concerned no one will come to [facility] to see him but would like [place] if needed.  Placed call to Cindie with [facility] and they have agreed to provide a nurse and PT for patient at discharge.   Expected Discharge Plan: [place] Barriers to Discharge: Continued Medical Work up   Patient Goals and CMS Choice Patient states their goals for this hospitalization and ongoing recovery are:: to return home and use home health if needed CMS Medicare.gov Compare Post Acute Care list provided to:: Patient Choice offered to / list presented to : Patient  Expected Discharge Plan and Services Expected Discharge Plan: [place]   Discharge Planning Services: CM Consult Post Acute Care Choice: [place] arrangements for the past 2 months: Single Family Home                 DME Arranged: N/A DME Agency: [place] Date DME Agency Contacted: 11/16/19 Time DME Agency Contacted: [id number] Representative spoke with at DME Agency: [place] Arranged: RN, PT   Date [place]: 11/16/19 Time [place]: 2774    Prior Living Arrangements/Services Living arrangements for the past 2 months: [place] with:: Self Patient language and need for interpreter reviewed:: Yes Do you feel safe going back to the place where you live?: Yes      Need for Family Participation in Patient Care: No (Comment) Care giver support system in place?: Yes (comment) Current home services: DME [facility]) Criminal Activity/Legal Involvement Pertinent to Current  Situation/Hospitalization: No - Comment as needed  Activities of Daily Living Home Assistive Devices/Equipment: Cane (specify quad or straight), CBG Meter ADL Screening (condition at time of admission) Patient's cognitive ability adequate to safely complete daily activities?: Yes Is the patient deaf or have difficulty hearing?: No Does the patient have difficulty seeing, even when wearing glasses/contacts?: No Does the patient have difficulty concentrating, remembering, or making decisions?: No Patient able to express need for assistance with ADLs?: Yes Does the patient have difficulty dressing or bathing?: No Independently performs ADLs?: Yes (appropriate for developmental age) Does the patient have difficulty walking or climbing stairs?: Yes Weakness of Legs: Both Weakness of Arms/Hands: None  Permission Sought/Granted                  Emotional Assessment Appearance:: Appears stated age Attitude/Demeanor/Rapport: Engaged Affect (typically observed): Accepting, Calm, Appropriate Orientation: : Oriented to Self, Oriented to Place, Oriented to  Time, Oriented to Situation Alcohol / Substance Use: Not Applicable Psych Involvement: No (comment)  Admission diagnosis:  Renal stone [N20.0] Pyelonephritis [N12] Acute pyelonephritis [N10] Fall in home, initial encounter [W19.Merril Abbe, J28.786] Patient Active Problem List   Diagnosis Date Noted  . Acute pyelonephritis 11/12/2019  . Chronic diastolic CHF (congestive heart failure) ([place]) 05/12/2018  . Chronic heart failure with preserved ejection fraction (HFpEF) ([place]) 12/05/2017  . NICM (nonischemic cardiomyopathy) ([place]) 04/18/2017  . Non-rheumatic mitral regurgitation 04/18/2017  . Chronic kidney disease, stage III (moderate) 04/18/2017  .  Type 2 diabetes mellitus with diabetic polyneuropathy, without long-term current use of insulin ([place]) 02/27/2017  . Hyperlipidemia due to type 2 diabetes mellitus ([place]) 02/27/2017  . Left ureteral  stone 10/25/2015  . Coronary artery disease involving native coronary artery of native heart without angina pectoris 09/22/2015  . First degree AV block 09/22/2015  . Hypertension associated with diabetes ([place]) 09/22/2015  . Hyperlipidemia LDL goal <70 09/22/2015  . Hypotension 08/09/2015  . Hydronephrosis with renal and ureteral calculus obstruction   . Kidney stone on left side   . OSA (obstructive sleep apnea)   . Hyponatremia 07/13/2015  . Uncontrolled type 2 diabetes mellitus ([place]) 07/13/2015  . Pulmonary hypertension ([place])   . Cardiorenal syndrome   . Morbid obesity due to excess calories ([place])   . Paroxysmal atrial fibrillation ([place])    PCP:  Vidal Schwalbe, MD Pharmacy:   [place], [place] - [address] [place] [address] Phone: [phone] Fax: [phone]     Social Determinants of Health (SDOH) Interventions    Readmission Risk Interventions No flowsheet data found.

## 2019-11-16 NOTE — Progress Notes (Signed)
Patient refusing bed alarm. Stating he will call out if he needs to get up. Educated on the importance of the  bed alarm. Patient still refusing.

## 2019-11-16 NOTE — Progress Notes (Addendum)
PROGRESS NOTE    [name]  ALP:[id number]  DOB: Jul 21, 1950  PCP: Vidal Schwalbe, MD 69 y.o.malewithh/oDMwith complications of CKD, A fibon chronic anticoagulation, nephrolithiasis, severe chronic left hydronephrosis, who was seen in theurologist office for evaluation ofdysuria,flank pain,fever, weakness and anorexia on 06/09 ,presents to the emergency room for evaluation after he had a fall. He had a temp as high as 102F.  He was offered admission form urology officewhich he declined.He received a dose of Rocephin in the office and wasdischarged home on Bactrim.Patient went home andstates that he was very weak and fell, he denies losing consciousness or feeling dizzy or light headed. ED Course:WBC 10.9, Hgb 11,Sodium 131, BUN 47, creat 2.87 (baseline 1.5-1.7).CT scan of abdomen/pelvis done without contrast which showed interval migration of a 15 mm calculus into the distal left ureterwith significant hydronephrosis identified.Perinephric stranding is noted suspicious for underlying superimposed infection.Multiple large calculi within the left kidney the largest of which measures 4.3 cm in greatest dimension.Abdominal x-ray showedlarge colonic stool volume, suggesting constipation. Hospital course:Patient received a dose of Rocephin in the ER and admitted to the hospital for furthermanagement of acute pyelonephritis/AKI.  Subjective: Patient sleeping in bed sideways.  Reports intermittent ureteric colic in left lower quadrant.  States had nephrostomy tubes in the past and is familiar with the procedure.  N.p.o. this morning for IR intervention.  Objective: Vitals:   11/16/19 1245 11/16/19 1300 11/16/19 1315 11/16/19 1417  BP: 129/73 132/83 128/74 135/71  Pulse: 89 89 82 83  Resp: 19 19 18 18   Temp:    98.1 F (36.7 C)  TempSrc:    Oral  SpO2: 93% 94% 94% 97%  Weight:      Height:        Intake/Output Summary (Last 24 hours) at 11/16/2019 1528 Last data  filed at 11/16/2019 1401 Gross per 24 hour  Intake 0 ml  Output 3075 ml  Net -3075 ml   Filed Weights   11/15/19 0500 11/16/19 0612 11/16/19 1038  Weight: 128.8 kg 128.7 kg 128.7 kg    Physical Examination:  General exam: Appears calm and comfortable  Respiratory system: Clear to auscultation. Respiratory effort normal. Cardiovascular system: S1 & S2 heard, RRR. No JVD, murmurs, rubs, gallops or clicks. No pedal edema. Gastrointestinal system: Abdomen is obese, soft and nontender anteriorly but does have left mild CVA tenderness. Normal bowel sounds heard. Central nervous system: Alert and oriented. No new focal neurological deficits. Extremities: No contractures, edema or joint deformities.  Skin: No rashes, lesions or ulcers Psychiatry: Judgement and insight appear normal. Mood & affect appropriate.   Data Reviewed: I have personally reviewed following labs and imaging studies  CBC: Recent Labs  Lab 11/12/19 1021 11/13/19 0602 11/14/19 0446  WBC 10.9* 7.8 9.3  NEUTROABS  --   --  7.2  HGB 11.0* 10.1* 10.1*  HCT 33.3* 30.9* 31.1*  MCV 89.5 90.4 90.1  PLT 191 190 353   Basic Metabolic Panel: Recent Labs  Lab 11/12/19 1021 11/13/19 0602 11/14/19 0446 11/15/19 0605 11/16/19 0553  NA 131* 131* 131* 131* 132*  K 4.1 3.7 3.9 4.0 4.5  CL 94* 96* 96* 98 97*  CO2 24 25 23 23 25   GLUCOSE 170* 162* 142* 149* 153*  BUN 47* 55* 50* 45* 39*  CREATININE 2.87* 2.94* 2.57* 2.31* 2.24*  CALCIUM 8.3* 7.9* 8.2* 8.2* 8.3*   GFR: Estimated Creatinine Clearance: 44.4 mL/min (A) (by C-G formula based on SCr of 2.24 mg/dL (H)). Liver Function Tests:  No results for input(s): AST, ALT, ALKPHOS, BILITOT, PROT, ALBUMIN in the last 168 hours. No results for input(s): LIPASE, AMYLASE in the last 168 hours. No results for input(s): AMMONIA in the last 168 hours. Coagulation Profile: No results for input(s): INR, PROTIME in the last 168 hours. Cardiac Enzymes: No results for input(s):  CKTOTAL, CKMB, CKMBINDEX, TROPONINI in the last 168 hours. BNP (last 3 results) No results for input(s): PROBNP in the last 8760 hours. HbA1C: No results for input(s): HGBA1C in the last 72 hours. CBG: Recent Labs  Lab 11/15/19 1625 11/15/19 2119 11/16/19 0748 11/16/19 1248 11/16/19 1411  GLUCAP 167* 176* 139* 145* 193*   Lipid Profile: No results for input(s): CHOL, HDL, LDLCALC, TRIG, CHOLHDL, LDLDIRECT in the last 72 hours. Thyroid Function Tests: No results for input(s): TSH, T4TOTAL, FREET4, T3FREE, THYROIDAB in the last 72 hours. Anemia Panel: No results for input(s): VITAMINB12, FOLATE, FERRITIN, TIBC, IRON, RETICCTPCT in the last 72 hours. Sepsis Labs: No results for input(s): PROCALCITON, LATICACIDVEN in the last 168 hours.  Recent Results (from the past 240 hour(s))  CULTURE, URINE COMPREHENSIVE     Status: None   Collection Time: 11/11/19 12:52 PM   Specimen: Urine   UR  Result Value Ref Range Status   Urine Culture, Comprehensive Final report  Final   Organism ID, Bacteria Comment  Final    Comment: Mixed urogenital flora 10,000-25,000 colony forming units per mL   Microscopic Examination     Status: Abnormal   Collection Time: 11/11/19 12:52 PM   Urine  Result Value Ref Range Status   WBC, UA 6-10 (A) 0 - 5 /hpf Final   RBC 0-2 0 - 2 /hpf Final   Epithelial Cells (non renal) 0-10 0 - 10 /hpf Final   Casts Present (A) None seen /lpf Final   Cast Type Granular casts (A) N/A Final   Crystals Present (A) N/A Final   Crystal Type Amorphous Sediment N/A Final   Bacteria, UA Few None seen/Few Final  SARS Coronavirus 2 by RT PCR (hospital order, performed in [place] hospital lab) Nasopharyngeal Nasopharyngeal Swab     Status: None   Collection Time: 11/12/19  3:50 PM   Specimen: Nasopharyngeal Swab  Result Value Ref Range Status   SARS Coronavirus 2 NEGATIVE NEGATIVE Final    Comment: (NOTE) SARS-CoV-2 target nucleic acids are NOT DETECTED.  The  SARS-CoV-2 RNA is generally detectable in upper and lower respiratory specimens during the acute phase of infection. The lowest concentration of SARS-CoV-2 viral copies this assay can detect is 250 copies / mL. A negative result does not preclude SARS-CoV-2 infection and should not be used as the sole basis for treatment or other patient management decisions.  A negative result may occur with improper specimen collection / handling, submission of specimen other than nasopharyngeal swab, presence of viral mutation(s) within the areas targeted by this assay, and inadequate number of viral copies (<250 copies / mL). A negative result must be combined with clinical observations, patient history, and epidemiological information.  Fact Sheet for Patients:   StrictlyIdeas.no  Fact Sheet for Healthcare Providers: BankingDealers.co.za  This test is not yet approved or  cleared by the [place] FDA and has been authorized for detection and/or diagnosis of SARS-CoV-2 by FDA under an Emergency Use Authorization (EUA).  This EUA will remain in effect (meaning this test can be used) for the duration of the COVID-19 declaration under Section 564(b)(1) of the Act, 21 U.S.C. section 360bbb-3(b)(1),  unless the authorization is terminated or revoked sooner.  Performed at [facility], [address] [place], [place] [address]   Urine Culture     Status: Abnormal   Collection Time: 11/13/19  8:42 AM   Specimen: Urine, Random  Result Value Ref Range Status   Specimen Description   Final    URINE, RANDOM Performed at [facility], [address] [place], [place] [address]    Special Requests   Final    NONE Performed at [facility], [address] [place], [place] [address]    Culture (A)  Final    <10,000 COLONIES/mL INSIGNIFICANT GROWTH Performed at [facility], [place] [address], [place], [place]  [address]    Report Status 11/14/2019 FINAL  Final  CULTURE, BLOOD (ROUTINE X 2) w Reflex to ID Panel     Status: None (Preliminary result)   Collection Time: 11/13/19 10:52 AM   Specimen: BLOOD  Result Value Ref Range Status   Specimen Description BLOOD RIGHT ANTECUBITAL  Final   Special Requests   Final    BOTTLES DRAWN AEROBIC AND ANAEROBIC Blood Culture results may not be optimal due to an excessive volume of blood received in culture bottles   Culture   Final    NO GROWTH 3 DAYS Performed at [facility], [address], [place], [place] [address]    Report Status PENDING  Incomplete  CULTURE, BLOOD (ROUTINE X 2) w Reflex to ID Panel     Status: None (Preliminary result)   Collection Time: 11/13/19 11:00 AM   Specimen: BLOOD  Result Value Ref Range Status   Specimen Description BLOOD BLOOD RIGHT HAND  Final   Special Requests   Final    BOTTLES DRAWN AEROBIC AND ANAEROBIC Blood Culture adequate volume   Culture   Final    NO GROWTH 3 DAYS Performed at [facility], [address] [place], [place] [address]    Report Status PENDING  Incomplete      Radiology Studies: [place] Intraoperative  Result Date: 11/16/2019 CLINICAL DATA:  Ultrasound was provided for use by the ordering physician, and a technical charge was applied by the performing facility.  No radiologist interpretation/professional services rendered.   IR IMAGE GUIDED DRAINAGE BY PERCUTANEOUS CATHETER  Result Date: 11/16/2019 INDICATION: History of urosepsis secondary to obstructive nephrolithiasis. Request made for placement of a left-sided percutaneous nephrostomy catheter for infection source control purposes. EXAM: 1. ULTRASOUND GUIDANCE FOR PUNCTURE OF THE LEFT RENAL COLLECTING SYSTEM 2. LEFT PERCUTANEOUS NEPHROSTOMY TUBE PLACEMENT. COMPARISON:  CT abdomen and pelvis-11/12/2019 MEDICATIONS: Patient is currently admitted to the hospital receiving intravenous antibiotics; the antibiotic was  administered in an appropriate time frame prior to skin puncture. ANESTHESIA/SEDATION: Moderate (conscious) sedation was employed during this procedure. A total of Versed 1 mg and Fentanyl 50 mcg was administered intravenously. Moderate Sedation Time: 16 minutes. The patient's level of consciousness and vital signs were monitored continuously by radiology nursing throughout the procedure under my direct supervision. CONTRAST:  10 mL Visipaque 320-administered into the renal collecting system FLUOROSCOPY TIME:  54 seconds (89.3 mGy) COMPLICATIONS: None immediate. PROCEDURE: The procedure, risks, benefits, and alternatives were explained to the patient. Questions regarding the procedure were encouraged and answered. The patient understands and consents to the procedure. A timeout was performed prior to the initiation of the procedure. The left flank region was prepped and draped in the usual sterile fashion and a sterile drape was applied covering the operative  field. A sterile gown and sterile gloves were used for the procedure. Local anesthesia was provided with 1% Lidocaine with epinephrine. Ultrasound was used to localize the left kidney. Under direct ultrasound guidance, a 20 gauge needle was advanced into the renal collecting system. An ultrasound image documentation was performed. Access within the collecting system was confirmed with the efflux of urine followed by limited contrast injection. Over a Nitrex wire, the tract was dilated with an Accustick stent. Next, under intermittent fluoroscopic guidance and over a short Amplatz wire, the track was dilated ultimately allowing placement of a 10-French percutaneous nephrostomy catheter which was advanced to the level of the renal pelvis where the coil was formed and locked. Contrast was injected and several spot fluoroscopic images were obtained in various obliquities. The catheter was secured at the skin with a Prolene retention suture and stat lock device and  connected to a gravity bag was placed. Next, approximately 500 cc of purulent appearing urine was aspirated. A representative sample of aspirated urine was capped and sent to the laboratory for analysis Dressings were applied. The patient tolerated procedure well without immediate postprocedural complication. FINDINGS: Ultrasound scanning demonstrates severe dilatation of the left renal collecting system with lack of significant identifiable renal cortex potentially suggestive of very limited function of the left kidney. Under a combination of ultrasound and fluoroscopic guidance, the left renal collecting system was successfully accessed and a 10-French percutaneous nephrostomy catheter with end coiled and locked within the renal pelvis. Contrast injection confirmed appropriate positioning. Next, approximately 500 cc of purulent appearing urine was aspirated. IMPRESSION: 1. Successful ultrasound and fluoroscopic guided placement of a left sided 10 French PCN. 2. Approximately 500 cc of purulent appearing urine was aspirated following PCN placement. A representative sample of aspirated urine was capped and sent to the laboratory for analysis. 3. Sonographic evaluation demonstrates an extremely limited amount of identifiable renal cortex as could be seen in the setting of a nonfunctional kidney. After the resolution of acute symptoms, further evaluation with nuclear medicine renal scintigraphy could be performed to evaluate the functionality of the left kidney as indicated. Electronically Signed   By: Sandi Mariscal M.D.   On: 11/16/2019 12:55        Scheduled Meds: . amiodarone  200 mg Oral Daily  . vitamin C  1,000 mg Oral Daily  . baclofen  10 mg Oral QPM  . carvedilol  6.25 mg Oral BID WC  . clonazePAM  1 mg Oral QHS  . dextromethorphan-guaiFENesin  2 tablet Oral BID  . insulin aspart  0-20 Units Subcutaneous TID WC  . insulin glargine  10 Units Subcutaneous QHS  . montelukast  10 mg Oral QHS  .  multivitamin-lutein  1 capsule Oral BID  . rosuvastatin  20 mg Oral QPM  . senna-docusate  1 tablet Oral QHS  . sodium chloride flush  5 mL Intracatheter Q8H  . tamsulosin  0.4 mg Oral Daily  . Vitamin D (Ergocalciferol)  50,000 Units Oral Q Mon  . vitamin E  400 Units Oral Q14 Days   Continuous Infusions: . sodium chloride 1,000 mL (11/16/19 1419)  .  ceFAZolin (ANCEF) IV 1 g (11/16/19 1420)     Assessment/Plan:  Acute pyelonephritis:with uretero/nephrolithiasis,history of chronic left-sided hydronephrosis.Imagingnowshows perinephric stranding. Admittedwith Rocephin 1 g IV daily.Urine cx from 6/11 with insignificant growth.Hewastreated with several course of antibiotic prior to admission, prior urine culture revealed grew resistant staph epidermidis and staph hominis, Blood cx no growth, now on IV ancef  per ID. Urology/ID input appreciated.  Hydronephrosis with renal and ureteral calculus obstruction-Patientpresented with flank pain/dysuria-noted to have an obstructing calculus with severe left-sided hydronephrosis. Continue Abx. Urology recommended nephrostomy tube placement by IR, n.p.o. this morning and Eliquis on hold.  Patient does report ureteric colic on the left side, defer to urology for any needed intervention/stenting.  Patient reports undergoing lithotripsy in the past.  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  AKI on CKDIIIb: in the setting of obstructing uropathy with above.Cr slowly trending down but not at baseline yet.Renallydosing meds.  Plan for nephrostomy tubetoday  Paroxysmal A. Fib-Continue amiodarone for rate controlbut carvedilolheld on admissiondue to relative hypotension.  Resume Eliquis when okay per urology  Anemia of chronic renal disease:Hgb stable, no signs of bleeding. Anticoagulation on hold for anticipated procedures.  Chronic diastolic heart failure-Held furosemide, losartan and carvedilolon admissiondue to relative  hypotension.Judicious IV fluid hydration(now off)-May resume after nephrostomy.  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  Obesity:Body mass index is 38.5 kg/m.  Likely contributing to sleep apnea.  Not sure if on CPAP at night at home.  Weakness/deconditioning: in the setting of acute illness, fell at home. Seen by PT-HH recommended.   DVT prophylaxis:Eliquis on hold Code Status:Full code Family / Patient Communication:Discussed with patient in detail and all questions answered to satisfaction.  He lives with a friend who apparently helped him take care of nephrostomy tube in the past along with home health. Disposition Plan: Status is: Inpatient  Remains inpatient appropriate because:Ongoing diagnostic testing needed not appropriate for outpatient work up, awaiting nephrostomy tube placement, urology clearance.   Dispo: The patient is from: Home Anticipated d/c is to: Home, likely with home health Anticipated d/c date is: 2 days Patient currently is not medically stable to d/c.    LOS: 4 days    Time spent: 35 minutes    Guilford Shi, MD [facility] Pager in [place]  If 7PM-7AM, please contact night-coverage [email or web address] 11/16/2019, 3:28 PM

## 2019-11-16 NOTE — Consult Note (Signed)
Pharmacy Antibiotic Note  [name] is a 69 y.o. male admitted on 11/12/2019 with UTI.  Pharmacy has been consulted for ancef dosing. urology wants 10-14 days of abx.   Plan: Ancef 1 g q8H.   Height: 6' (182.9 cm) Weight: 128.7 kg (283 lb 11.7 oz) IBW/kg (Calculated) : 77.6  Temp (24hrs), Avg:98.3 F (36.8 C), Min:97.9 F (36.6 C), Max:98.6 F (37 C)  Recent Labs  Lab 11/12/19 1021 11/13/19 0602 11/14/19 0446 11/15/19 0605 11/16/19 0553  WBC 10.9* 7.8 9.3  --   --   CREATININE 2.87* 2.94* 2.57* 2.31* 2.24*    Estimated Creatinine Clearance: 44.4 mL/min (A) (by C-G formula based on SCr of 2.24 mg/dL (H)).    Allergies  Allergen Reactions  . Other Hives    Blue cheese    Dose adjustments this admission: None  Microbiology results: 6/11 BCx: pending 6/11 UCx: <10,000 COLONIES/mL INSIGNIFICANT GROWTH  Thank you for allowing pharmacy to be a part of this patient's care.  Oswald Hillock, PharmD, BCPS 11/16/2019 1:41 PM

## 2019-11-16 NOTE — Consult Note (Signed)
Chief Complaint: Left-sided nephrolithiasis and urinary obstruction   Referring Physician(s): Vaillancourt (Urology)  Patient Status: [place] - In-pt  History of Present Illness: [name] is a 69 y.o. 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    As such patient now presents for image guided placement of a left-sided previous nephrostomy catheter for infection source control purposes as well as to potentially serve as an access for future PCNL as indicated.  Patient continues to complain of left-sided flank pain.  He currently denies fevers or chills.  Note, the patient previously underwent left-sided previous nephrostomy catheter placement on 07/29/2015.  Past Medical History:  Diagnosis Date   Cardiomyopathy ([place])    Chronic combined systolic (congestive) and diastolic (congestive) heart failure (HCC)    a. EF 25% by cath in 2013 b. Echo in 07/2015 showing EF of 15-20%, moderate MR, moderate Pulm HTN, severely dilated IVC   CKD (chronic kidney disease) stage 3, GFR 30-59 ml/min    Coronary artery disease    Diabetes mellitus type 2, uncontrolled ([place])    a. A1c 11.0 in 07/2015.   Hypertension    Kidney stones    Left   New onset atrial fibrillation (HCC)    a. diagnosed in 07/2015 b. started on Eliquis   Paroxysmal atrial fibrillation (HCC)    Pollen allergies 09-21-15   pt called and stated that he woke up and had some drainage-pt states he did not see the color of the drainage-pt denies running a fever and this only happened once-pt instructed to call Dr Audree Bane office if he starts running a fever or if the color of drainage  becomes yellow/green   Psoriasis    Pulmonary hypertension ([place])    Renal disorder    kidney stone   Sleep apnea     Past Surgical History:  Procedure Laterality Date   CARDIAC CATHETERIZATION N/A 07/25/2015   Procedure: Right/Left Heart Cath and Coronary Angiography;  Surgeon: Wellington Hampshire, MD;  Location: [facility];  Service: Cardiovascular;  Laterality: N/A;   CARDIAC CATHETERIZATION     [place], [place]   CORONARY ANGIOPLASTY WITH STENT PLACEMENT     CYSTOSCOPY WITH STENT PLACEMENT Left 09/07/2015   Procedure: CYSTOSCOPY WITH STENT PLACEMENT;  Surgeon: Hollice Espy, MD;  Location: [facility];  Service: Urology;  Laterality: Left;   CYSTOSCOPY/URETEROSCOPY/HOLMIUM LASER/STENT PLACEMENT Left 10/25/2015   Procedure: CYSTOSCOPY/URETEROSCOPY/HOLMIUM LASER/STENT EXCHANGE;  Surgeon: Hollice Espy, MD;  Location: [facility];  Service: Urology;  Laterality: Left;   CYSTOSCOPY/URETEROSCOPY/HOLMIUM LASER/STENT PLACEMENT Left 05/11/2019   Procedure: CYSTOSCOPY/URETEROSCOPY/HOLMIUM LASER/STENT PLACEMENT;  Surgeon: Hollice Espy, MD;  Location: [facility];  Service: Urology;  Laterality: Left;   EXTRACORPOREAL SHOCK WAVE LITHOTRIPSY Left 04/02/2019   Procedure: EXTRACORPOREAL SHOCK WAVE LITHOTRIPSY (ESWL);  Surgeon: Hollice Espy, MD;  Location: [facility];  Service: Urology;  Laterality: Left;   EXTRACORPOREAL SHOCK WAVE LITHOTRIPSY Left 03/05/2019   Procedure: EXTRACORPOREAL SHOCK WAVE LITHOTRIPSY (ESWL);  Surgeon: Abbie Sons, MD;  Location: [facility];  Service: Urology;  Laterality: Left;   KIDNEY SURGERY     NEPHROSTOMY TUBE PLACEMENT ([place] HX) Left    URETEROSCOPY WITH HOLMIUM LASER LITHOTRIPSY Left 09/07/2015  Procedure: URETEROSCOPY WITH HOLMIUM LASER LITHOTRIPSY;  Surgeon: Hollice Espy, MD;  Location: [facility];  Service: Urology;  Laterality: Left;    Allergies: Other  Medications: Prior to Admission  medications   Medication Sig Start Date End Date Taking? Authorizing Provider  amiodarone (PACERONE) 200 MG tablet Take 1 tablet (200 mg total) by mouth daily. Patient taking differently: Take 200 mg by mouth every morning.  08/01/15  Yes Hower, Aaron Mose, MD  apixaban (ELIQUIS) 5 MG TABS tablet Take 1 tablet (5 mg total) 2 (two) times daily by mouth. 04/17/17  Yes End, Harrell Gave, MD  Ascorbic Acid (VITAMIN C) 1000 MG tablet Take 1,000 mg by mouth daily.   Yes [provider]  b complex vitamins tablet Take 1 tablet by mouth every evening.    Yes [provider]  baclofen (LIORESAL) 10 MG tablet Take 10 mg by mouth every evening.    Yes [provider]  carvedilol (COREG) 6.25 MG tablet TAKE 1 TABLET BY MOUTH TWICE DAILY Patient taking differently: Take 6.25 mg by mouth 2 (two) times daily.  10/26/16  Yes End, Harrell Gave, MD  clonazePAM (KLONOPIN) 1 MG tablet Take 1 mg by mouth at bedtime.  01/23/17  Yes [provider]  Coenzyme Q10 (COQ10) 400 MG CAPS Take 400 mg by mouth 2 (two) times daily.   Yes [provider]  dextromethorphan-guaiFENesin (MUCINEX DM) 30-600 MG 12hr tablet Take 1 tablet by mouth 2 (two) times daily.   Yes [provider]  docusate sodium (COLACE) 100 MG capsule Take 1 capsule (100 mg total) by mouth 2 (two) times daily. Patient taking differently: Take 100-200 mg by mouth See admin instructions. Take 1 capsule (100 mg) by mouth in the morning & take 2 capsules (200 mg) by mouth at night. 03/05/19  Yes Hollice Espy, MD  furosemide (LASIX) 80 MG tablet Take 1 tablet (80 mg total) by mouth daily. 12/17/17  Yes End, Harrell Gave, MD  glipiZIDE (GLUCOTROL) 10 MG tablet Take 10 mg by mouth 2 (two) times daily.    Yes [provider]  Cathrine Muster 550 MG CAPS Take 550 mg by mouth every evening.    Yes [provider]  HYDROcodone-acetaminophen (NORCO/VICODIN) 5-325 MG tablet Take 1 tablet by mouth every 6  (six) hours as needed for moderate pain. 11/11/19  Yes McGowan, Larene Beach A, PA-C  Insulin Glargine, 2 Unit Dial, (TOUJEO MAX SOLOSTAR) 300 UNIT/ML SOPN Inject 40 Units into the skin every morning. Titrate up to 60 units/day, per endocrinology instructions. 05/05/18  Yes [provider]  JARDIANCE 10 MG TABS tablet Take 10 mg by mouth every evening.  04/28/18  Yes [provider]  losartan (COZAAR) 25 MG tablet Take 25 mg by mouth every evening.  04/28/18  Yes [provider]  magnesium oxide (MAG-OX) 400 MG tablet Take 400 mg by mouth every morning.    Yes [provider]  montelukast (SINGULAIR) 10 MG tablet Take 10 mg by mouth at bedtime.  01/27/18  Yes [provider]  Multiple Vitamins-Minerals (PRESERVISION AREDS 2 PO) Take 1 tablet by mouth 2 (two) times daily.   Yes [provider]  oxybutynin (DITROPAN) 5 MG tablet Take 1 tablet (5 mg total) by mouth every 8 (eight) hours as needed for bladder spasms. 10/23/19  Yes Vaillancourt, Samantha, PA-C  polyethylene glycol (MIRALAX / GLYCOLAX) packet Take 17 g by mouth daily as needed for mild constipation.   Yes [provider]  rosuvastatin (CRESTOR) 20 MG  tablet Take 20 mg by mouth every evening.  01/27/19  Yes [provider]  sulfamethoxazole-trimethoprim (BACTRIM DS) 800-160 MG tablet Take 1 tablet by mouth every 12 (twelve) hours. 11/11/19  Yes McGowan, Larene Beach A, PA-C  tamsulosin (FLOMAX) 0.4 MG CAPS capsule Take 1 capsule (0.4 mg total) by mouth daily. 10/19/19  Yes Vaillancourt, Samantha, PA-C  cephALEXin (KEFLEX) 250 MG capsule Take 1 capsule (250 mg total) by mouth daily. Patient not taking: Reported on 11/12/2019 10/23/19   Debroah Loop, PA-C  OZEMPIC, 1 MG/DOSE, 2 MG/1.5ML SOPN Inject 1 mg into the skin every Monday. In the evening. 01/27/19   [provider]  Polyethyl Glycol-Propyl Glycol (SYSTANE) 0.4-0.3 % SOLN Place 1 drop into both eyes 3 (three) times  daily as needed (dry/irritated eyes.).    [provider]  Vitamin D, Ergocalciferol, (DRISDOL) 1.25 MG (50000 UT) CAPS capsule Take 50,000 Units by mouth every Monday.  02/26/18   [provider]     Family History  Problem Relation Age of Onset   Heart failure Father    Heart attack Brother    Kidney cancer Neg Hx    Bladder Cancer Neg Hx    Prostate cancer Neg Hx     Social History   Socioeconomic History   Marital status: Single    Spouse name: Not on file   Number of children: Not on file   Years of education: Not on file   Highest education level: Not on file  Occupational History   Not on file  Tobacco Use   Smoking status: Never Smoker   Smokeless tobacco: Never Used  Substance and Sexual Activity   Alcohol use: Yes    Alcohol/week: 0.0 standard drinks    Comment: occasionally   Drug use: No    Types: Marijuana, Cocaine, Opium, LSD    Comment: Past, greater than 10 years ago   Sexual activity: Not on file  Other Topics Concern   Not on file  Social History Narrative   Not on file   Social Determinants of Health   Financial Resource Strain:    Difficulty of Paying Living Expenses:   Food Insecurity:    Worried About Charity fundraiser in the Last Year:    Arboriculturist in the Last Year:   Transportation Needs:    Film/video editor (Medical):    Lack of Transportation (Non-Medical):   Physical Activity:    Days of Exercise per Week:    Minutes of Exercise per Session:   Stress:    Feeling of Stress :   Social Connections:    Frequency of Communication with Friends and Family:    Frequency of Social Gatherings with Friends and Family:    Attends Religious Services:    Active Member of Clubs or Organizations:    Attends Music therapist:    Marital Status:     ECOG Status: 2 - Symptomatic, <50% confined to bed  Review of Systems: A 12 point ROS discussed and pertinent positives  are indicated in the HPI above.  All other systems are negative.  Review of Systems  Vital Signs: BP 132/67    Pulse 87    Temp 98.2 F (36.8 C) (Oral)    Resp 20    Ht 6' (1.829 m)    Wt 128.7 kg    SpO2 97%    BMI 38.48 kg/m   Physical Exam  Imaging: DG Abd 1 View  Result Date:  11/12/2019 CLINICAL DATA:  Flank pain. Nausea and decreased appetite for 2 days. Concern for kidney stones. EXAM: ABDOMEN - 1 VIEW COMPARISON:  05/07/2019 CT.  Plain film 09/19/2019. FINDINGS: Nonobstructive bowel gas pattern. A large amount of colonic stool, including projecting over the left kidney. The previously described lower pole left renal collecting system calculus may be identified on the second image, 4.2 cm. IMPRESSION: Large colonic stool volume, suggesting constipation. The previously described lower pole left renal collecting system dominant stone is partially obscured, favored to be visualized. Electronically Signed   By: Abigail Miyamoto M.D.   On: 11/12/2019 09:34   CT Renal Stone Study  Result Date: 11/12/2019 CLINICAL DATA:  Flank pain and fevers EXAM: CT ABDOMEN AND PELVIS WITHOUT CONTRAST TECHNIQUE: Multidetector CT imaging of the abdomen and pelvis was performed following the standard protocol without IV contrast. COMPARISON:  05/07/2019 FINDINGS: Lower chest: No acute abnormality. Hepatobiliary: No focal liver abnormality is seen. No gallstones, gallbladder wall thickening, or biliary dilatation. Pancreas: Unremarkable. No pancreatic ductal dilatation or surrounding inflammatory changes. Spleen: Normal in size without focal abnormality. Adrenals/Urinary Tract: Adrenal glands are within normal limits. Right kidney is unremarkable without obstructive change. Left kidney demonstrates hydronephrosis increased from the prior exam with evidence of hydroureter. In the distal left ureter above the ureterovesical junction there is a 15 mm stone identified causing the obstructive change. Multiple calculi are  noted within the left kidney the largest of which measures 4.3 cm. Considerable cortical thinning is noted bladder is partially distended. Stomach/Bowel: Scattered diverticular change of the colon is noted without evidence of diverticulitis. The appendix has been surgically removed. Small bowel is unremarkable. No gastric abnormality is seen. Vascular/Lymphatic: Aortic atherosclerosis. No enlarged abdominal or pelvic lymph nodes. Reproductive: Prostate is unremarkable. Other: No abdominal wall hernia or abnormality. No abdominopelvic ascites. Musculoskeletal: No acute or significant osseous findings. IMPRESSION: Interval migration of a 15 mm calculus into the distal left ureter with significant hydronephrosis identified. Perinephric stranding is noted as well suspicious for underlying superimposed infection. Multiple large calculi within the left kidney the largest of which measures 4.3 cm in greatest dimension. Electronically Signed   By: Inez Catalina M.D.   On: 11/12/2019 14:42    Labs:  CBC: Recent Labs    04/10/19 1339 11/12/19 1021 11/13/19 0602 11/14/19 0446  WBC 8.3 10.9* 7.8 9.3  HGB 14.1 11.0* 10.1* 10.1*  HCT 42.2 33.3* 30.9* 31.1*  PLT 202 191 190 195    COAGS: Recent Labs    04/10/19 1437  INR 1.5*  APTT 36    BMP: Recent Labs    11/13/19 0602 11/14/19 0446 11/15/19 0605 11/16/19 0553  NA 131* 131* 131* 132*  K 3.7 3.9 4.0 4.5  CL 96* 96* 98 97*  CO2 25 23 23 25   GLUCOSE 162* 142* 149* 153*  BUN 55* 50* 45* 39*  CALCIUM 7.9* 8.2* 8.2* 8.3*  CREATININE 2.94* 2.57* 2.31* 2.24*  GFRNONAA 21* 25* 28* 29*  GFRAA 24* 29* 33* 34*    LIVER FUNCTION TESTS: Recent Labs    02/06/19 1332 04/10/19 1339  BILITOT 0.9 1.0  AST 67* 21  ALT 61* 26  ALKPHOS 138* 59  PROT 7.3 7.7  ALBUMIN 2.7* 4.1    TUMOR MARKERS: No results for input(s): AFPTM, CEA, CA199, CHROMGRNA in the last 8760 hours.  Assessment and Plan:  [name] is a 69 y.o. 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  As such patient now presents for image guided placement of a left-sided previous nephrostomy catheter for infection source control purposes as well as to potentially serve as an access for future PCNL as indicated.  Patient continues to complain of left-sided flank pain.  He currently denies fevers or chills.  Note, the patient previously underwent left-sided previous nephrostomy catheter placement on 07/29/2015.  Risks and benefits of left-sided PCN placement was discussed with the patient including bleeding/hematuria, infection, damage to adjacent structures, worsening renal function and/or sepsis.  All of the patient's questions were answered, patient is agreeable to proceed. Consent signed and in chart. A copy of this report was sent to the requesting provider on this date.  Electronically Signed: Sandi Mariscal, MD 11/16/2019, 11:06 AM   I spent a total of 20 Minutes in face to face in clinical consultation, greater than 50% of which was counseling/coordinating care for left sided PCN placement.

## 2019-11-16 NOTE — Plan of Care (Signed)

## 2019-11-16 NOTE — Progress Notes (Signed)
ID Had PCN today Doing fine No fever    Patient Vitals for the past 24 hrs:  BP Temp Temp src Pulse Resp SpO2 Height Weight  11/16/19 1315 128/74 -- -- 82 18 94 % -- --  11/16/19 1300 132/83 -- -- 89 19 94 % -- --  11/16/19 1245 129/73 -- -- 89 19 93 % -- --  11/16/19 1240 116/67 -- -- 89 12 95 % -- --  11/16/19 1220 (!) 154/74 -- -- 68 14 98 % -- --  11/16/19 1210 140/73 -- -- 68 16 99 % -- --  11/16/19 1205 (!) 144/80 -- -- 77 14 98 % -- --  11/16/19 1200 136/88 -- -- 80 16 95 % -- --  11/16/19 1150 (!) 147/89 -- -- 88 20 97 % -- --  11/16/19 1038 132/67 98.2 F (36.8 C) Oral -- 20 -- 6' (1.829 m) 128.7 kg  11/16/19 0949 128/72 -- Oral 87 18 97 % -- --  11/16/19 0947 -- 98.6 F (37 C) Oral -- -- -- -- --  11/16/19 0612 133/79 98.2 F (36.8 C) Oral 80 15 94 % -- 128.7 kg  11/15/19 1939 137/75 98.4 F (36.9 C) Oral 88 20 97 % -- --  11/15/19 1626 135/73 97.9 F (36.6 C) -- 84 18 99 % -- --  awake and alert Left PCN- bag has bloody urine Chest b/l air entry  labs  CBC Latest Ref Rng & Units 11/14/2019 11/13/2019 11/12/2019  WBC 4.0 - 10.5 K/uL 9.3 7.8 10.9(H)  Hemoglobin 13.0 - 17.0 g/dL 10.1(L) 10.1(L) 11.0(L)  Hematocrit 39 - 52 % 31.1(L) 30.9(L) 33.3(L)  Platelets 150 - 400 K/uL 195 190 191    CMP Latest Ref Rng & Units 11/16/2019 11/15/2019 11/14/2019  Glucose 70 - 99 mg/dL 153(H) 149(H) 142(H)  BUN 8 - 23 mg/dL 39(H) 45(H) 50(H)  Creatinine 0.61 - 1.24 mg/dL 2.24(H) 2.31(H) 2.57(H)  Sodium 135 - 145 mmol/L 132(L) 131(L) 131(L)  Potassium 3.5 - 5.1 mmol/L 4.5 4.0 3.9  Chloride 98 - 111 mmol/L 97(L) 98 96(L)  CO2 22 - 32 mmol/L 25 23 23   Calcium 8.9 - 10.3 mg/dL 8.3(L) 8.2(L) 8.2(L)  Total Protein 6.5 - 8.1 g/dL - - -  Total Bilirubin 0.3 - 1.2 mg/dL - - -  Alkaline Phos 38 - 126 U/L - - -  AST 15 - 41 U/L - - -  ALT 0 - 44 U/L - - -    Impression/recommendation left kidney stone - chronic --has had multiple lithotripsy, stents Chronic hydronephrosis Fever  with flank pain with stone in the ureter S/p PCN today- 500cc purulent urine drained by IR, sent for culture Currently on cefazolin for 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  Await nephrostomy culture     CKD-improving    CAD s/p stent  Afib   Chronic anemia  Discussed the management with the patient

## 2019-11-16 NOTE — Progress Notes (Signed)
Urology Inpatient Progress Note  Subjective: Creatinine down today, 2.24.  Urine culture from 6/9 with mixed urogenital flora, urine culture from 6/11 with insignificant growth.  Blood cultures from 6/11 pending with no growth at 3 days.  On antibiotics as below.  Patient remains afebrile, VSS.  He reports stable, severe left flank pain.  No solids this morning, last Eliquis 6/9 pm.  Anti-infectives: Anti-infectives (From admission, onward)   Start     Dose/Rate Route Frequency Ordered Stop   11/14/19 1400  ceFAZolin (ANCEF) IVPB 1 g/50 mL premix     Discontinue     1 g 100 mL/hr over 30 Minutes Intravenous Every 8 hours 11/14/19 1318     11/13/19 1400  ceFAZolin (ANCEF) IVPB 1 g/50 mL premix  Status:  Discontinued        1 g 100 mL/hr over 30 Minutes Intravenous Every 12 hours 11/13/19 1327 11/14/19 1318   11/12/19 1515  cefTRIAXone (ROCEPHIN) 1 g in sodium chloride 0.9 % 100 mL IVPB        1 g 200 mL/hr over 30 Minutes Intravenous  Once 11/12/19 1501 11/12/19 1745      Current Facility-Administered Medications  Medication Dose Route Frequency Provider Last Rate Last Admin  . 0.9 %  sodium chloride infusion   Intravenous PRN Guilford Shi, MD      . amiodarone (PACERONE) tablet 200 mg  200 mg Oral Daily Agbata, Tochukwu, MD   200 mg at 11/15/19 0902  . ascorbic acid (VITAMIN C) tablet 1,000 mg  1,000 mg Oral Daily Agbata, Tochukwu, MD   1,000 mg at 11/15/19 0902  . baclofen (LIORESAL) tablet 10 mg  10 mg Oral QPM Agbata, Tochukwu, MD   10 mg at 11/15/19 1810  . carvedilol (COREG) tablet 6.25 mg  6.25 mg Oral BID WC Florencia Reasons, MD   6.25 mg at 11/15/19 1810  . ceFAZolin (ANCEF) IVPB 1 g/50 mL premix  1 g Intravenous Q8H Dallie Piles, RPH 100 mL/hr at 11/16/19 0606 1 g at 11/16/19 0606  . clonazePAM (KLONOPIN) tablet 1 mg  1 mg Oral QHS Agbata, Tochukwu, MD   1 mg at 11/15/19 2124  . dextromethorphan-guaiFENesin (MUCINEX DM) 30-600 MG per 12 hr tablet 2 tablet  2 tablet Oral  BID Florencia Reasons, MD   2 tablet at 11/15/19 2123  . HYDROcodone-acetaminophen (NORCO/VICODIN) 5-325 MG per tablet 1 tablet  1 tablet Oral Q6H PRN Agbata, Tochukwu, MD   1 tablet at 11/15/19 2124  . insulin aspart (novoLOG) injection 0-20 Units  0-20 Units Subcutaneous TID WC Agbata, Tochukwu, MD   4 Units at 11/15/19 1809  . insulin glargine (LANTUS) injection 10 Units  10 Units Subcutaneous QHS Florencia Reasons, MD   10 Units at 11/15/19 2149  . montelukast (SINGULAIR) tablet 10 mg  10 mg Oral QHS Agbata, Tochukwu, MD   10 mg at 11/15/19 2124  . morphine 2 MG/ML injection 2 mg  2 mg Intravenous Q4H PRN Agbata, Tochukwu, MD      . multivitamin-lutein (OCUVITE-LUTEIN) capsule 1 capsule  1 capsule Oral BID Agbata, Tochukwu, MD   1 capsule at 11/15/19 2124  . ondansetron (ZOFRAN) tablet 4 mg  4 mg Oral Q6H PRN Agbata, Tochukwu, MD       Or  . ondansetron (ZOFRAN) injection 4 mg  4 mg Intravenous Q6H PRN Agbata, Tochukwu, MD      . oxybutynin (DITROPAN) tablet 5 mg  5 mg Oral Q8H PRN Agbata, Tochukwu, MD  5 mg at 11/14/19 1713  . polyethylene glycol (MIRALAX / GLYCOLAX) packet 17 g  17 g Oral Daily PRN Agbata, Tochukwu, MD      . polyvinyl alcohol (LIQUIFILM TEARS) 1.4 % ophthalmic solution 1 drop  1 drop Both Eyes TID PRN Agbata, Tochukwu, MD      . rosuvastatin (CRESTOR) tablet 20 mg  20 mg Oral QPM Agbata, Tochukwu, MD   20 mg at 11/15/19 1810  . senna-docusate (Senokot-S) tablet 1 tablet  1 tablet Oral QHS Florencia Reasons, MD   1 tablet at 11/15/19 2124  . tamsulosin (FLOMAX) capsule 0.4 mg  0.4 mg Oral Daily Agbata, Tochukwu, MD   0.4 mg at 11/15/19 0902  . Vitamin D (Ergocalciferol) (DRISDOL) capsule 50,000 Units  50,000 Units Oral Q Mon Agbata, Tochukwu, MD      . vitamin E capsule 400 Units  400 Units Oral Q14 Days Agbata, Tochukwu, MD   400 Units at 11/12/19 2032   Objective: Vital signs in last 24 hours: Temp:  [97.7 F (36.5 C)-98.4 F (36.9 C)] 98.2 F (36.8 C) (06/14 0612) Pulse Rate:  [71-88] 80  (06/14 0612) Resp:  [15-20] 15 (06/14 0612) BP: (126-137)/(73-79) 133/79 (06/14 0612) SpO2:  [88 %-100 %] 94 % (06/14 0612) Weight:  [128.7 kg] 128.7 kg (06/14 0612)  Intake/Output from previous day: 06/13 0701 - 06/14 0700 In: -  Out: 2800 [Urine:2800] Intake/Output this shift: No intake/output data recorded.  Physical Exam Vitals and nursing note reviewed.  Constitutional:      General: [name] not in acute distress.    Appearance: [name] not ill-appearing, toxic-appearing or diaphoretic.  HENT:     Head: Normocephalic and atraumatic.  Pulmonary:     Effort: Pulmonary effort is normal. No respiratory distress.  Skin:    General: Skin is warm and dry.  Neurological:     Mental Status: [name] alert and oriented to person, place, and time.  Psychiatric:        Mood and Affect: Mood normal.        Behavior: Behavior normal.    Lab Results:  Recent Labs    11/14/19 0446  WBC 9.3  HGB 10.1*  HCT 31.1*  PLT 195   BMET Recent Labs    11/15/19 0605 11/16/19 0553  NA 131* 132*  K 4.0 4.5  CL 98 97*  CO2 23 25  GLUCOSE 149* 153*  BUN 45* 39*  CREATININE 2.31* 2.24*  CALCIUM 8.2* 8.3*   Assessment & Plan: 69 year old comorbid male with extensive urologic history notable for severe chronic left renal atrophy, left hydronephrosis, CKD, and recurrent nephrolithiasis with extensive left-sided stone burden admitted in the setting of recent conservatively managed outpatient acute stone episode versus UTI after a fall with the inability to rise.  He was subsequently found to have acute on chronic renal insufficiency secondary to a 10 x 15 mm distal left ureteral stone and left pyelonephritis.  Patient clinically improving over the weekend on empiric antibiotics and fluid resuscitation.  Despite this, his pain remains uncontrolled.  We will plan for left PCN placement with IR.  IR notified, orders placed.    Recommendations: -NPO today in advance of left PCN placement (per IR,  may have to defer until tomorrow due to scheduling) -Continue to hold Eliquis -Continue antibiotics for a total of 10-14 days of therapy for left pyelonephritis + febrile UTI despite negative urine cultures  Debroah Loop, PA-C 11/16/2019

## 2019-11-16 NOTE — Progress Notes (Signed)
Patient clinically stable post Left Nephrostomy tube placement per Dr Pascal Lux, vitals stable. Awake/alert and oriented post procedure. Denies complaints at this time. Dressing dry and intact.to left neph. Tube site. Report given to Hollace Kinnier with questions answered post procedure. Received versed 1mg  along with Fentanyl 43mcg IV for procedure. Removed tan/pink purulent 526ml with tube placement.

## 2019-11-16 NOTE — Procedures (Signed)
Pre Procedure Dx: Hydronephrosis Post Procedural Dx: Same  Successful [place] and fluoroscopic guided placement of a left sided PCN with end coiled and locked within the renal pelvis.  Approximately 500 cc of purulent appearing urine was aspirated following left sided PCN placement.  A representative sample was capped and sent to the lab for analysis.   PCN connected to gravity bag.  EBL: None Complications: None immediate.  Ronny Bacon, MD Pager #: [phone]

## 2019-11-16 NOTE — TOC Progression Note (Signed)
Transition of Care [facility]) - Progression Note    Patient Details  Name: Braxston Quinter MRN: [id number] Date of Birth: [birth year]  Transition of Care [facility]) CM/SW [place], RN Phone Number: 11/16/2019, 12:52 PM  Clinical Narrative:    Madilyn Hook from [facility] called and they will not be able to service this patient.  Spoke to Ewing with [facility] and they will be able to service patient for RN and Pt after discharge.   Expected Discharge Plan: [place] Barriers to Discharge: Continued Medical Work up  Expected Discharge Plan and Services Expected Discharge Plan: [place]   Discharge Planning Services: CM Consult Post Acute Care Choice: [place] arrangements for the past 2 months: Single Family Home                 DME Arranged: N/A DME Agency: [place] Date DME Agency Contacted: 11/16/19 Time DME Agency Contacted: 1021 Representative spoke with at DME Agency: [place]: RN, PT [place] Agency: [place] ([place]) Date [place]: 11/16/19 Time [place]: 1252 Representative spoke with at [place]: [place] ([place]) Interventions    Readmission Risk Interventions No flowsheet data found.

## 2019-11-16 NOTE — Progress Notes (Signed)
PT Cancellation Note  Patient Details Name: [name] MRN: [id number] DOB: 08-08-1950   Cancelled Treatment:    Reason Eval/Treat Not Completed:  (Chart reviewed for treatment session.  Patient politely declined at this time, "Not today.  I just had that stent placed". Will continue efforts next date as appropriate and available.)   [name] H. Owens Shark, PT, DPT, NCS 11/16/19, 2:47 PM [phone]

## 2019-11-17 LAB — URINE CULTURE: Culture: NO GROWTH

## 2019-11-17 LAB — GLUCOSE, CAPILLARY
Glucose-Capillary: 183 mg/dL — ABNORMAL HIGH (ref 70–99)
Glucose-Capillary: 192 mg/dL — ABNORMAL HIGH (ref 70–99)
Glucose-Capillary: 179 mg/dL — ABNORMAL HIGH (ref 70–99)
Glucose-Capillary: 224 mg/dL — ABNORMAL HIGH (ref 70–99)

## 2019-11-17 MED ORDER — FUROSEMIDE 40 MG PO TABS
40.0000 mg | ORAL_TABLET | Freq: Every day | ORAL | Status: DC
  Filled 2019-11-17: qty 1

## 2019-11-17 MED ORDER — FUROSEMIDE 40 MG PO TABS
80.0000 mg | ORAL_TABLET | Freq: Every day | ORAL | Status: DC
  Administered 2019-11-17: 80 mg via ORAL
  Filled 2019-11-17: qty 2

## 2019-11-17 NOTE — Care Management Important Message (Signed)
Important Message  Patient Details  Name: [name] MRN: [id number] Date of Birth: [birth year]   Medicare Important Message Given:  Yes     Dannette Barbara 11/17/2019, 11:10 AM

## 2019-11-17 NOTE — Progress Notes (Signed)
Report given to [place] on 2C. Patient transferred to 60 with nurse tech. All of his belongings taken with him as well as medications. RN added to secure chat with MD regarding patient's furosemide.

## 2019-11-17 NOTE — Plan of Care (Signed)
Pt has no complaints today. Nephrostomy tube draining well. Has urine output. No pain.Tolerating diet. Refused bed alarm.

## 2019-11-17 NOTE — Progress Notes (Signed)
PROGRESS NOTE    [name]  WLN:[id number]  DOB: 16-Jan-1951  PCP: Vidal Schwalbe, MD 69 y.o.malewithh/oDMwith complications of CKD, A fibon chronic anticoagulation, nephrolithiasis, severe chronic left hydronephrosis, who was seen in theurologist office for evaluation ofdysuria,flank pain,fever, weakness and anorexia on 06/09 ,presents to the emergency room for evaluation after he had a fall. He had a temp as high as 102F.  He was offered admission form urology officewhich he declined.He received a dose of Rocephin in the office and wasdischarged home on Bactrim.Patient went home andstates that he was very weak and fell, he denies losing consciousness or feeling dizzy or light headed. ED Course:WBC 10.9, Hgb 11,Sodium 131, BUN 47, creat 2.87 (baseline 1.5-1.7).CT scan of abdomen/pelvis done without contrast which showed interval migration of a 15 mm calculus into the distal left ureterwith significant hydronephrosis identified.Perinephric stranding is noted suspicious for underlying superimposed infection.Multiple large calculi within the left kidney the largest of which measures 4.3 cm in greatest dimension.Abdominal x-ray showedlarge colonic stool volume, suggesting constipation. Hospital course:Patient received a dose of Rocephin in the ER and admitted to the hospital for furthermanagement of acute pyelonephritis/AKI. Seen by Urology and ID. IR consulted for nephrostomy-Approximately 500 cc of purulent appearing urine was aspirated following left sided PCN placement on 6/14. Cultures sent.  Subjective: Patient is s/p left nephrostomy, he states he had tubes in the past and is familiar with home care needs but his friend who is the primary caregiver apparently had a fall resulting in rib fractures.  He does not have a ride/house help today but feels should be able to arrange transportation in a.m.  He wants home diuretics to be resumed.  Objective: Vitals:    11/16/19 2300 11/17/19 0300 11/17/19 0717 11/17/19 0727  BP:   (!) 157/65   Pulse:   82   Resp:    18  Temp: 100.2 F (37.9 C) 100 F (37.8 C) 97.8 F (36.6 C)   TempSrc:   Oral   SpO2:   95%   Weight:      Height:        Intake/Output Summary (Last 24 hours) at 11/17/2019 0817 Last data filed at 11/17/2019 7408 Gross per 24 hour  Intake 607.41 ml  Output 2350 ml  Net -1742.59 ml   Filed Weights   11/15/19 0500 11/16/19 0612 11/16/19 1038  Weight: 128.8 kg 128.7 kg 128.7 kg    Physical Examination:  General exam: Appears calm and comfortable  Respiratory system: Clear to auscultation. Respiratory effort normal. Cardiovascular system: S1 & S2 heard, RRR. No JVD, murmurs, rubs, gallops or clicks. No pedal edema. Gastrointestinal system: Abdomen is obese, soft and nontender anteriorly but does have left mild CVA tenderness. Normal bowel sounds heard. Central nervous system: Alert and oriented. No new focal neurological deficits. Extremities: No contractures, edema or joint deformities.  Skin: No rashes, lesions or ulcers Psychiatry: Judgement and insight appear normal. Mood & affect appropriate.   Data Reviewed: I have personally reviewed following labs and imaging studies  CBC: Recent Labs  Lab 11/12/19 1021 11/13/19 0602 11/14/19 0446  WBC 10.9* 7.8 9.3  NEUTROABS  --   --  7.2  HGB 11.0* 10.1* 10.1*  HCT 33.3* 30.9* 31.1*  MCV 89.5 90.4 90.1  PLT 191 190 144   Basic Metabolic Panel: Recent Labs  Lab 11/12/19 1021 11/13/19 0602 11/14/19 0446 11/15/19 0605 11/16/19 0553  NA 131* 131* 131* 131* 132*  K 4.1 3.7 3.9 4.0 4.5  CL 94*  96* 96* 98 97*  CO2 24 25 23 23 25   GLUCOSE 170* 162* 142* 149* 153*  BUN 47* 55* 50* 45* 39*  CREATININE 2.87* 2.94* 2.57* 2.31* 2.24*  CALCIUM 8.3* 7.9* 8.2* 8.2* 8.3*   GFR: Estimated Creatinine Clearance: 44.4 mL/min (A) (by C-G formula based on SCr of 2.24 mg/dL (H)). Liver Function Tests: No results for input(s): AST,  ALT, ALKPHOS, BILITOT, PROT, ALBUMIN in the last 168 hours. No results for input(s): LIPASE, AMYLASE in the last 168 hours. No results for input(s): AMMONIA in the last 168 hours. Coagulation Profile: No results for input(s): INR, PROTIME in the last 168 hours. Cardiac Enzymes: No results for input(s): CKTOTAL, CKMB, CKMBINDEX, TROPONINI in the last 168 hours. BNP (last 3 results) No results for input(s): PROBNP in the last 8760 hours. HbA1C: No results for input(s): HGBA1C in the last 72 hours. CBG: Recent Labs  Lab 11/16/19 1248 11/16/19 1411 11/16/19 1643 11/16/19 2050 11/17/19 0745  GLUCAP 145* 193* 196* 200* 183*   Lipid Profile: No results for input(s): CHOL, HDL, LDLCALC, TRIG, CHOLHDL, LDLDIRECT in the last 72 hours. Thyroid Function Tests: No results for input(s): TSH, T4TOTAL, FREET4, T3FREE, THYROIDAB in the last 72 hours. Anemia Panel: No results for input(s): VITAMINB12, FOLATE, FERRITIN, TIBC, IRON, RETICCTPCT in the last 72 hours. Sepsis Labs: No results for input(s): PROCALCITON, LATICACIDVEN in the last 168 hours.  Recent Results (from the past 240 hour(s))  CULTURE, URINE COMPREHENSIVE     Status: None   Collection Time: 11/11/19 12:52 PM   Specimen: Urine   UR  Result Value Ref Range Status   Urine Culture, Comprehensive Final report  Final   Organism ID, Bacteria Comment  Final    Comment: Mixed urogenital flora 10,000-25,000 colony forming units per mL   Microscopic Examination     Status: Abnormal   Collection Time: 11/11/19 12:52 PM   Urine  Result Value Ref Range Status   WBC, UA 6-10 (A) 0 - 5 /hpf Final   RBC 0-2 0 - 2 /hpf Final   Epithelial Cells (non renal) 0-10 0 - 10 /hpf Final   Casts Present (A) None seen /lpf Final   Cast Type Granular casts (A) N/A Final   Crystals Present (A) N/A Final   Crystal Type Amorphous Sediment N/A Final   Bacteria, UA Few None seen/Few Final  SARS Coronavirus 2 by RT PCR (hospital order, performed in  [place] hospital lab) Nasopharyngeal Nasopharyngeal Swab     Status: None   Collection Time: 11/12/19  3:50 PM   Specimen: Nasopharyngeal Swab  Result Value Ref Range Status   SARS Coronavirus 2 NEGATIVE NEGATIVE Final    Comment: (NOTE) SARS-CoV-2 target nucleic acids are NOT DETECTED.  The SARS-CoV-2 RNA is generally detectable in upper and lower respiratory specimens during the acute phase of infection. The lowest concentration of SARS-CoV-2 viral copies this assay can detect is 250 copies / mL. A negative result does not preclude SARS-CoV-2 infection and should not be used as the sole basis for treatment or other patient management decisions.  A negative result may occur with improper specimen collection / handling, submission of specimen other than nasopharyngeal swab, presence of viral mutation(s) within the areas targeted by this assay, and inadequate number of viral copies (<250 copies / mL). A negative result must be combined with clinical observations, patient history, and epidemiological information.  Fact Sheet for Patients:   StrictlyIdeas.no  Fact Sheet for Healthcare Providers: BankingDealers.co.za  This  test is not yet approved or  cleared by the [place] and has been authorized for detection and/or diagnosis of SARS-CoV-2 by FDA under an Emergency Use Authorization (EUA).  This EUA will remain in effect (meaning this test can be used) for the duration of the COVID-19 declaration under Section 564(b)(1) of the Act, 21 U.S.C. section 360bbb-3(b)(1), unless the authorization is terminated or revoked sooner.  Performed at [facility], [address], [place], [place] [address]   Urine Culture     Status: Abnormal   Collection Time: 11/13/19  8:42 AM   Specimen: Urine, Random  Result Value Ref Range Status   Specimen Description   Final    URINE, RANDOM Performed at [facility], [address] [place], [place] [address]    Special Requests   Final    NONE Performed at [facility], [address] [place], [place] [address]    Culture (A)  Final    <10,000 COLONIES/mL INSIGNIFICANT GROWTH Performed at [facility], [place] [address] [place], [place] [address]    Report Status 11/14/2019 FINAL  Final  CULTURE, BLOOD (ROUTINE X 2) w Reflex to ID Panel     Status: None (Preliminary result)   Collection Time: 11/13/19 10:52 AM   Specimen: BLOOD  Result Value Ref Range Status   Specimen Description BLOOD RIGHT ANTECUBITAL  Final   Special Requests   Final    BOTTLES DRAWN AEROBIC AND ANAEROBIC Blood Culture results may not be optimal due to an excessive volume of blood received in culture bottles   Culture   Final    NO GROWTH 4 DAYS Performed at [facility], [address] [place], [place] [address]    Report Status PENDING  Incomplete  CULTURE, BLOOD (ROUTINE X 2) w Reflex to ID Panel     Status: None (Preliminary result)   Collection Time: 11/13/19 11:00 AM   Specimen: BLOOD  Result Value Ref Range Status   Specimen Description BLOOD BLOOD RIGHT HAND  Final   Special Requests   Final    BOTTLES DRAWN AEROBIC AND ANAEROBIC Blood Culture adequate volume   Culture   Final    NO GROWTH 4 DAYS Performed at [facility], [address], [place], [place] [address]    Report Status PENDING  Incomplete      Radiology Studies: [place] Intraoperative  Result Date: 11/16/2019 CLINICAL DATA:  Ultrasound was provided for use by the ordering physician, and a technical charge was applied by the performing facility.  No radiologist interpretation/professional services rendered.   IR IMAGE GUIDED DRAINAGE BY PERCUTANEOUS CATHETER  Result Date: 11/16/2019 INDICATION: History of urosepsis secondary to obstructive nephrolithiasis. Request made for placement of a left-sided percutaneous nephrostomy catheter for infection source control  purposes. EXAM: 1. ULTRASOUND GUIDANCE FOR PUNCTURE OF THE LEFT RENAL COLLECTING SYSTEM 2. LEFT PERCUTANEOUS NEPHROSTOMY TUBE PLACEMENT. COMPARISON:  CT abdomen and pelvis-11/12/2019 MEDICATIONS: Patient is currently admitted to the hospital receiving intravenous antibiotics; the antibiotic was administered in an appropriate time frame prior to skin puncture. ANESTHESIA/SEDATION: Moderate (conscious) sedation was employed during this procedure. A total of Versed 1 mg and Fentanyl 50 mcg was administered intravenously. Moderate Sedation Time: 16 minutes. The patient's level of consciousness and vital signs were monitored continuously by radiology nursing throughout the procedure under my direct supervision. CONTRAST:  10 mL Visipaque 320-administered into the renal collecting system FLUOROSCOPY TIME:  54 seconds (64.4 mGy) COMPLICATIONS: None immediate.  PROCEDURE: The procedure, risks, benefits, and alternatives were explained to the patient. Questions regarding the procedure were encouraged and answered. The patient understands and consents to the procedure. A timeout was performed prior to the initiation of the procedure. The left flank region was prepped and draped in the usual sterile fashion and a sterile drape was applied covering the operative field. A sterile gown and sterile gloves were used for the procedure. Local anesthesia was provided with 1% Lidocaine with epinephrine. Ultrasound was used to localize the left kidney. Under direct ultrasound guidance, a 20 gauge needle was advanced into the renal collecting system. An ultrasound image documentation was performed. Access within the collecting system was confirmed with the efflux of urine followed by limited contrast injection. Over a Nitrex wire, the tract was dilated with an Accustick stent. Next, under intermittent fluoroscopic guidance and over a short Amplatz wire, the track was dilated ultimately allowing placement of a 10-French percutaneous  nephrostomy catheter which was advanced to the level of the renal pelvis where the coil was formed and locked. Contrast was injected and several spot fluoroscopic images were obtained in various obliquities. The catheter was secured at the skin with a Prolene retention suture and stat lock device and connected to a gravity bag was placed. Next, approximately 500 cc of purulent appearing urine was aspirated. A representative sample of aspirated urine was capped and sent to the laboratory for analysis Dressings were applied. The patient tolerated procedure well without immediate postprocedural complication. FINDINGS: Ultrasound scanning demonstrates severe dilatation of the left renal collecting system with lack of significant identifiable renal cortex potentially suggestive of very limited function of the left kidney. Under a combination of ultrasound and fluoroscopic guidance, the left renal collecting system was successfully accessed and a 10-French percutaneous nephrostomy catheter with end coiled and locked within the renal pelvis. Contrast injection confirmed appropriate positioning. Next, approximately 500 cc of purulent appearing urine was aspirated. IMPRESSION: 1. Successful ultrasound and fluoroscopic guided placement of a left sided 10 French PCN. 2. Approximately 500 cc of purulent appearing urine was aspirated following PCN placement. A representative sample of aspirated urine was capped and sent to the laboratory for analysis. 3. Sonographic evaluation demonstrates an extremely limited amount of identifiable renal cortex as could be seen in the setting of a nonfunctional kidney. After the resolution of acute symptoms, further evaluation with nuclear medicine renal scintigraphy could be performed to evaluate the functionality of the left kidney as indicated. Electronically Signed   By: Sandi Mariscal M.D.   On: 11/16/2019 12:55        Scheduled Meds:  amiodarone  200 mg Oral Daily   vitamin C   1,000 mg Oral Daily   baclofen  10 mg Oral QPM   carvedilol  6.25 mg Oral BID WC   clonazePAM  1 mg Oral QHS   dextromethorphan-guaiFENesin  2 tablet Oral BID   insulin aspart  0-20 Units Subcutaneous TID WC   insulin glargine  10 Units Subcutaneous QHS   montelukast  10 mg Oral QHS   multivitamin-lutein  1 capsule Oral BID   rosuvastatin  20 mg Oral QPM   senna-docusate  1 tablet Oral QHS   sodium chloride flush  5 mL Intracatheter Q8H   tamsulosin  0.4 mg Oral Daily   Vitamin D (Ergocalciferol)  50,000 Units Oral Q Mon   vitamin E  400 Units Oral Q14 Days   Continuous Infusions:  sodium chloride Stopped (11/16/19 1420)    ceFAZolin (  ANCEF) IV 1 g (11/17/19 6256)     Assessment/Plan:  Acute pyelonephritis:with uretero/nephrolithiasis,history of chronic left-sided hydronephrosis.Imagingnowshows perinephric stranding. Admittedwith Rocephin 1 g IV daily.Urine cx from 6/11 with insignificant growth.Hewastreated with several course of antibiotic prior to admission, prior urine culture revealed grew resistant staph epidermidis and staph hominis, Blood cx no growth, on IV ancef per ID. Approximately 500 cc of purulent appearing urine (could be calcium sediment per urology) was aspirated following left sided PCN placement ON 6/14--Cultures pending. Urology/ID input appreciated. Remains on IV antibiotics.  Hydronephrosis with renal and ureteral calculus obstruction-Patientpresented with flank pain/dysuria-noted to have an obstructing calculus with severe left-sided hydronephrosis. Continue Abx.  Underwent nephrostomy tube placement by IR on 6/14 and Eliquis on hold.  Patient today reports feeling much better and with improvement in ureteric colic on the left side, defer to urology for any needed intervention/stenting for 15 mm distal ureteric stone.  Patient reports undergoing lithotripsy in the past.  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  Per urology can  resume Eliquis when urine starts to turn pink/clear.  They have cleared patient for discharge.  Home health referral placed.  Patient's friend should be able to assist with nephrostomy care/dressing changes from Thursday.  On oxybutynin/tamsulosin  AKI on CKDIIIb, mild hyponatremia: in the setting of obstructing uropathy with above.Cr slowly trending down (2.8 to 2.2) but not at baseline yet (baseline creatinine appears to be around 1.6-1.7).Marland KitchenSodium slowly improving from 131 to 132. renallydosing meds.  Patient insisting on resuming home dose of Lasix per nurse--Continue to hold losartan for now.   BMP in a.m  Paroxysmal A. Fib-Continue amiodarone for rate controlbut carvedilolheld on admissiondue to relative hypotension.  Resume Eliquis when okay per urology  Anemia of chronic anemia of renal disease:Hgb stable, no signs of bleeding. Anticoagulation on hold for anticipated procedures but plan to resume soon.  Chronic diastolic heart failure-Held furosemide, losartan and carvedilolon admissiondue to relative hypotension.Judicious IV fluid hydration(now off)-May resume after nephrostomy.Resumed Lasix upon patient's insistence.   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  Obesity:Body mass index is 38.5 kg/m.  Likely contributing to sleep apnea.  Patient reports using CPAP at 11 mmHg at home.  Using O2 while here.  Weakness/deconditioning: in the setting of acute illness, fell at home. Seen by PT-HH recommended.   DVT prophylaxis:Eliquis on hold Code Status:Full code Family / Patient Communication:Discussed with patient in detail and all questions answered to satisfaction.  He lives with a friend who apparently  helped him take care of nephrostomy tube in the past along with home health. Disposition Plan: Status is: Inpatient  Remains inpatient appropriate because:Ongoing diagnostic testing needed not appropriate for outpatient work up, awaiting nephrostomy tube placement, urology clearance.   Dispo: The patient is from: Home Anticipated d/c is to: Home, likely with home health Anticipated d/c date is: 1-2 days Patient currently is not medically stable to d/c.    LOS: 5 days    Time spent: 35 minutes    Guilford Shi, MD [facility] Pager in [place]  If 7PM-7AM, please contact night-coverage [email or web address] 11/17/2019, 8:17 AM

## 2019-11-17 NOTE — Progress Notes (Signed)
Urology Inpatient Progress Note  Subjective: Left PCN placed yesterday, patient reports improvement in pain since.  Blood cultures pending with no growth at 4 days.  Nephrostomy culture pending.  Left PCN in place draining cherry urine with white debris.  500 mL nephrostomy output yesterday.  Anti-infectives: Anti-infectives (From admission, onward)   Start     Dose/Rate Route Frequency Ordered Stop   11/14/19 1400  ceFAZolin (ANCEF) IVPB 1 g/50 mL premix     Discontinue     1 g 100 mL/hr over 30 Minutes Intravenous Every 8 hours 11/14/19 1318     11/13/19 1400  ceFAZolin (ANCEF) IVPB 1 g/50 mL premix  Status:  Discontinued        1 g 100 mL/hr over 30 Minutes Intravenous Every 12 hours 11/13/19 1327 11/14/19 1318   11/12/19 1515  cefTRIAXone (ROCEPHIN) 1 g in sodium chloride 0.9 % 100 mL IVPB        1 g 200 mL/hr over 30 Minutes Intravenous  Once 11/12/19 1501 11/12/19 1745      Current Facility-Administered Medications  Medication Dose Route Frequency Provider Last Rate Last Admin  . 0.9 %  sodium chloride infusion   Intravenous PRN Guilford Shi, MD   Stopped at 11/16/19 1420  . amiodarone (PACERONE) tablet 200 mg  200 mg Oral Daily Agbata, Tochukwu, MD   200 mg at 11/17/19 0823  . ascorbic acid (VITAMIN C) tablet 1,000 mg  1,000 mg Oral Daily Agbata, Tochukwu, MD   1,000 mg at 11/17/19 0824  . baclofen (LIORESAL) tablet 10 mg  10 mg Oral QPM Agbata, Tochukwu, MD   10 mg at 11/16/19 1656  . carvedilol (COREG) tablet 6.25 mg  6.25 mg Oral BID WC Florencia Reasons, MD   6.25 mg at 11/17/19 0823  . ceFAZolin (ANCEF) IVPB 1 g/50 mL premix  1 g Intravenous Q8H Dallie Piles, RPH 100 mL/hr at 11/17/19 0609 1 g at 11/17/19 0609  . clonazePAM (KLONOPIN) tablet 1 mg  1 mg Oral QHS Agbata, Tochukwu, MD   1 mg at 11/16/19 2051  . dextromethorphan-guaiFENesin (MUCINEX DM) 30-600 MG per 12 hr tablet 2 tablet  2 tablet Oral BID Florencia Reasons, MD   2 tablet at 11/17/19 0824  . HYDROcodone-acetaminophen  (NORCO/VICODIN) 5-325 MG per tablet 1 tablet  1 tablet Oral Q6H PRN Agbata, Tochukwu, MD   1 tablet at 11/17/19 0823  . insulin aspart (novoLOG) injection 0-20 Units  0-20 Units Subcutaneous TID WC Agbata, Tochukwu, MD   4 Units at 11/17/19 0823  . insulin glargine (LANTUS) injection 10 Units  10 Units Subcutaneous QHS Florencia Reasons, MD   10 Units at 11/16/19 2052  . montelukast (SINGULAIR) tablet 10 mg  10 mg Oral QHS Agbata, Tochukwu, MD   10 mg at 11/16/19 2053  . morphine 2 MG/ML injection 2 mg  2 mg Intravenous Q4H PRN Agbata, Tochukwu, MD      . multivitamin-lutein (OCUVITE-LUTEIN) capsule 1 capsule  1 capsule Oral BID Agbata, Tochukwu, MD   1 capsule at 11/17/19 0823  . ondansetron (ZOFRAN) tablet 4 mg  4 mg Oral Q6H PRN Agbata, Tochukwu, MD       Or  . ondansetron (ZOFRAN) injection 4 mg  4 mg Intravenous Q6H PRN Agbata, Tochukwu, MD      . oxybutynin (DITROPAN) tablet 5 mg  5 mg Oral Q8H PRN Agbata, Tochukwu, MD   5 mg at 11/14/19 1713  . polyethylene glycol (MIRALAX / GLYCOLAX) packet 17 g  17 g Oral Daily PRN Agbata, Tochukwu, MD      . polyvinyl alcohol (LIQUIFILM TEARS) 1.4 % ophthalmic solution 1 drop  1 drop Both Eyes TID PRN Agbata, Tochukwu, MD      . rosuvastatin (CRESTOR) tablet 20 mg  20 mg Oral QPM Agbata, Tochukwu, MD   20 mg at 11/16/19 1655  . senna-docusate (Senokot-S) tablet 1 tablet  1 tablet Oral QHS Florencia Reasons, MD   1 tablet at 11/16/19 2051  . sodium chloride flush (NS) 0.9 % injection 5 mL  5 mL Intracatheter Q8H Sandi Mariscal, MD   5 mL at 11/17/19 0609  . tamsulosin (FLOMAX) capsule 0.4 mg  0.4 mg Oral Daily Agbata, Tochukwu, MD   0.4 mg at 11/17/19 0824  . Vitamin D (Ergocalciferol) (DRISDOL) capsule 50,000 Units  50,000 Units Oral Q Donneta Romberg, Tochukwu, MD   50,000 Units at 11/16/19 1403  . vitamin E capsule 400 Units  400 Units Oral Q14 Days Agbata, Tochukwu, MD   400 Units at 11/12/19 2032     Objective: Vital signs in last 24 hours: Temp:  [97.8 F (36.6 C)-102.4  F (39.1 C)] 97.8 F (36.6 C) (06/15 0717) Pulse Rate:  [68-89] 82 (06/15 0717) Resp:  [12-20] 18 (06/15 0727) BP: (116-157)/(65-89) 157/65 (06/15 0717) SpO2:  [93 %-100 %] 95 % (06/15 0717) Weight:  [128.7 kg] 128.7 kg (06/14 1038)  Intake/Output from previous day: 06/14 0701 - 06/15 0700 In: 607.4 [I.V.:10.1; IV Piggyback:447.3] Out: 2150 [Urine:2150] Intake/Output this shift: Total I/O In: -  Out: 200 [Urine:200]  Physical Exam Vitals and nursing note reviewed.  Constitutional:      General: [name] not in acute distress.    Appearance: [name] not ill-appearing, toxic-appearing or diaphoretic.  HENT:     Head: Normocephalic and atraumatic.  Pulmonary:     Effort: Pulmonary effort is normal. No respiratory distress.  Genitourinary:    Comments: Left flank without erythema or tenderness Skin:    General: Skin is warm and dry.  Neurological:     Mental Status: [name] alert and oriented to person, place, and time.  Psychiatric:        Mood and Affect: Mood normal.        Behavior: Behavior normal.    BMET Recent Labs    11/15/19 0605 11/16/19 0553  NA 131* 132*  K 4.0 4.5  CL 98 97*  CO2 23 25  GLUCOSE 149* 153*  BUN 45* 39*  CREATININE 2.31* 2.24*  CALCIUM 8.2* 8.3*   Assessment & Plan: 69 year old comorbid male with extensive urologic history notable for severe chronic left renal atrophy, left hydronephrosis, CKD, and recurrent nephrolithiasis with extensive left-sided stone burden admitted in the setting of recent conservatively managed acute stone episode versus UTI.  He was subsequently found to have acute on chronic renal insufficiency secondary to a 10 x 15 mm distal left ureteral stone and left pyelonephritis.  Eliquis held, left PCN placed 11/16/2019.  Patient reports significant improvement in left flank pain since PCN placement yesterday.  Okay for discharge from urologic perspective with plans for outpatient follow-up to discuss left nephrectomy with plans  to obtain nephrology and cardiac clearance in advance of procedure.  Recommendations: -May resume Eliquis once nephrostomy output clears to pink or lighter -Continue antibiotics for total of 10 to 14 days of therapy for left pyelonephritis plus febrile UTI; follow nephrostomy urine cultures from yesterday, though "purulent" output may represent milk of calcium  Debroah Loop,  PA-C 11/17/2019

## 2019-11-17 NOTE — Progress Notes (Signed)
4144 50 ML emptied from nephrostomy bag. Changed dressing to nephrostomy tube with split guaze and tegaderm dressing and flushed with 3ml of Normal Saline. NADN Additional 50 ml returned and emptied from nephrotomy bag

## 2019-11-18 ENCOUNTER — Telehealth: Payer: Self-pay | Admitting: Internal Medicine

## 2019-11-18 DIAGNOSIS — N179 Acute kidney failure, unspecified: Secondary | ICD-10-CM

## 2019-11-18 DIAGNOSIS — I5022 Chronic systolic (congestive) heart failure: Secondary | ICD-10-CM

## 2019-11-18 DIAGNOSIS — N189 Chronic kidney disease, unspecified: Secondary | ICD-10-CM

## 2019-11-18 LAB — BASIC METABOLIC PANEL
Anion gap: 10 (ref 5–15)
BUN: 44 mg/dL — ABNORMAL HIGH (ref 8–23)
CO2: 26 mmol/L (ref 22–32)
Calcium: 8.2 mg/dL — ABNORMAL LOW (ref 8.9–10.3)
Chloride: 97 mmol/L — ABNORMAL LOW (ref 98–111)
Creatinine, Ser: 2.34 mg/dL — ABNORMAL HIGH (ref 0.61–1.24)
GFR calc Af Amer: 32 mL/min — ABNORMAL LOW (ref >60)
GFR calc non Af Amer: 28 mL/min — ABNORMAL LOW (ref >60)
Glucose, Bld: 153 mg/dL — ABNORMAL HIGH (ref 70–99)
Potassium: 3.9 mmol/L (ref 3.5–5.1)
Sodium: 133 mmol/L — ABNORMAL LOW (ref 135–145)

## 2019-11-18 LAB — CULTURE, BLOOD (ROUTINE X 2)
Culture: NO GROWTH
Culture: NO GROWTH
Special Requests: ADEQUATE

## 2019-11-18 LAB — HEMOGLOBIN AND HEMATOCRIT, BLOOD
HCT: 29.1 % — ABNORMAL LOW (ref 39.0–52.0)
Hemoglobin: 9.7 g/dL — ABNORMAL LOW (ref 13.0–17.0)

## 2019-11-18 LAB — GLUCOSE, CAPILLARY
Glucose-Capillary: 139 mg/dL — ABNORMAL HIGH (ref 70–99)
Glucose-Capillary: 217 mg/dL — ABNORMAL HIGH (ref 70–99)

## 2019-11-18 MED ORDER — FUROSEMIDE 40 MG PO TABS
80.0000 mg | ORAL_TABLET | Freq: Every day | ORAL | Status: DC
  Administered 2019-11-18: 80 mg via ORAL
  Filled 2019-11-18: qty 2

## 2019-11-18 MED ORDER — CEPHALEXIN 500 MG PO CAPS: 500.0000 mg | ORAL_CAPSULE | Freq: Three times a day (TID) | ORAL | 0 refills | Status: AC

## 2019-11-18 NOTE — TOC Transition Note (Signed)
Transition of Care [facility]) - CM/SW Discharge Note   Patient Details  Name: [name] MRN: [id number] Date of Birth: [birth year]  [facility] of [facility] CM/SW Contact:  Candie Chroman, LCSW Phone Number: 11/18/2019, 12:04 PM   Clinical Narrative: Patient has orders to discharge home today. [place] representative is aware. Home health orders are in. No further concerns. CSW signing off.    Final next level of care: [place] Barriers to Discharge: Barriers Resolved   Patient Goals and CMS Choice Patient states their goals for this hospitalization and ongoing recovery are:: to return home and use home health if needed CMS Medicare.gov Compare Post Acute Care list provided to:: Patient Choice offered to / list presented to : Patient  Discharge Placement                    Patient and family notified of of transfer: 11/18/19  Discharge Plan and Services   Discharge Planning Services: CM Consult Post Acute Care Choice: Home Health          DME Arranged: N/A DME Agency: [place] Date DME Agency Contacted: 11/16/19 Time DME Agency Contacted: [phone] Representative spoke with at DME Agency: [place]: RN, PT [place] Agency: [place] ([place]) Date [place]: 11/18/19 Time [place]: [phone] Representative spoke with at [place]: [place] (SDOH) Interventions     Readmission Risk Interventions No flowsheet data found.

## 2019-11-18 NOTE — Plan of Care (Signed)
The patient has been stable. No falls. IV removed. Education has been provided on how to change the dressing around the nephrostomy tube. Drain out has been measured. The patient was also educated on flushing the left nephrostomy tube with 21m of NS. The patient has been was provided with dressing, flushes and tape. Discharged information provided.  Problem: Education: Goal: Knowledge of General Education information will improve Description: Including pain rating scale, medication(s)/side effects and non-pharmacologic comfort measures Outcome: Completed/Met   Problem: Health Behavior/Discharge Planning: Goal: Ability to manage health-related needs will improve Outcome: Completed/Met   Problem: Clinical Measurements: Goal: Ability to maintain clinical measurements within normal limits will improve Outcome: Completed/Met Goal: Will remain free from infection Outcome: Completed/Met Goal: Diagnostic test results will improve Outcome: Completed/Met Goal: Respiratory complications will improve Outcome: Completed/Met Goal: Cardiovascular complication will be avoided Outcome: Completed/Met   Problem: Activity: Goal: Risk for activity intolerance will decrease Outcome: Completed/Met   Problem: Nutrition: Goal: Adequate nutrition will be maintained Outcome: Completed/Met   Problem: Coping: Goal: Level of anxiety will decrease Outcome: Completed/Met   Problem: Elimination: Goal: Will not experience complications related to bowel motility Outcome: Completed/Met Goal: Will not experience complications related to urinary retention Outcome: Completed/Met   Problem: Pain Managment: Goal: General experience of comfort will improve Outcome: Completed/Met   Problem: Safety: Goal: Ability to remain free from injury will improve Outcome: Completed/Met   Problem: Skin Integrity: Goal: Risk for impaired skin integrity will decrease Outcome: Completed/Met   Problem: Acute Rehab PT  Goals(only PT should resolve) Goal: Pt Will Go Supine/Side To Sit Outcome: Completed/Met Goal: Pt Will Transfer Bed To Chair/Chair To Bed Outcome: Completed/Met Goal: Pt Will Ambulate Outcome: Completed/Met Goal: Pt Will Go Up/Down Stairs Outcome: Completed/Met

## 2019-11-18 NOTE — Discharge Summary (Addendum)
Physician Discharge Summary  [name] FBP:[id number] DOB: [birth year] DOA: 11/12/2019  PCP: Vidal Schwalbe, MD  Admit date: 11/12/2019 Discharge date: 11/18/2019 Consultations: Urology, Interventional radiology Admitted From: home Disposition: home with [facility]  Discharge Diagnoses:  Principal Problem:   Acute pyelonephritis Active Problems:   Paroxysmal atrial fibrillation (HCC)   OSA (obstructive sleep apnea)   Hydronephrosis with renal and ureteral calculus obstruction   NICM (nonischemic cardiomyopathy) ([place])   Chronic kidney disease, stage III (moderate)   Chronic diastolic CHF (congestive heart failure) [facility])   Hospital Course Summary: 69 y.o.malewithh/oDMwith complications of CKD, A fibon chronic anticoagulation, nephrolithiasis, severe chronic left hydronephrosis, who was seen in theurologist office for evaluation ofdysuria,flank pain,fever, weakness and anorexia on 06/09 ,presents to the emergency room for evaluation after he had a fall. He had a temp as high as 102F.  He was offered admission form urology officewhich he declined.He received a dose of Rocephin in the office and wasdischarged home on Bactrim.Patient went home andstates that he was very weak and fell, he denies losing consciousness or feeling dizzy or light headed. ED Course:WBC 10.9, Hgb 11,Sodium 131, BUN 47, creat 2.87 (baseline 1.5-1.7).CT scan of abdomen/pelvis done without contrast which showed interval migration of a 15 mm calculus into the distal left ureterwith significant hydronephrosis identified.Perinephric stranding is noted suspicious for underlying superimposed infection.Multiple large calculi within the left kidney the largest of which measures 4.3 cm in greatest dimension.Abdominal x-ray showedlarge colonic stool volume, suggesting constipation. Hospital course:Patient received a dose of Rocephin in the ER and admitted to the hospital for furthermanagement of acute  pyelonephritis/AKI. Seen by Urology. IR consulted for nephrostomy-Approximately 500 cc of purulent appearing urine was aspirated following left sided PCN placement on 6/14. Cultures sent.  Acute pyelonephritis:with uretero/nephrolithiasis,history of chronic left-sided hydronephrosis.Imagingshowed perinephric stranding. Admittedwith Rocephin 1 g IV daily.Urine cx from 6/11 with insignificant growth.Hewastreated with several course of antibiotic prior to admission, prior urine culture revealed grew resistant staph epidermidis and staph hominis, Blood cx no growth, has been on IV ancef per ID. Approximately 500 cc of "purulent appearing" urine was aspirated following left sided PCN placement per IR note on 6/14--Cultures so far with no growth-"purulent" output may represent milk of calcium per urology. Urology input appreciated. Recieved IV antibiotics x 7 days and can be transitioned to oral antibiotics (IV Ancef->keflex) for a total of 10-14 day course per Urology.  Hydronephrosis with renal and ureteral calculus obstruction-Patientpresented with flank pain/dysuria-noted to have an obstructing calculus with severe left-sided hydronephrosis. Continue Abx.  Underwent nephrostomy tube placement by IR on 6/14 and Eliquis on hold.  Patient today reports feeling much better and with improvement in ureteric colic on the left side, defer to urology for any needed intervention/stenting for 15 mm distal ureteric stone.  Patient reports undergoing lithotripsy in the past.  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  Per urology can resume Eliquis as urine clear today.  They have cleared patient for discharge.  Home health referral placed.  Patient's friend should be able to assist with nephrostomy care/dressing changes from Thursday.  On oxybutynin/tamsulosin  AKI on CKDIIIb, mild hyponatremia: in the setting of obstructing uropathy with above.Cr slowly trending down (2.8 to 2.2) but not at baseline yet  (baseline creatinine appears to be around 1.6-1.7 in 2017 and ~2.2 to 2.5 in last couple of years)..Sodium slowly improving from 131 to 132. renallydosing meds. Resumed home dose of Lasix on 6/15 and creat today at 2.3--Advised to hold losartan for  now and resume next week after PCP follow up if BP up and renal function stable.    Paroxysmal A. Fib-Continue amiodarone for rate controlbut carvedilolheld on admissiondue to relative hypotension.  Resume Eliquis when okay per urology  Anemia of chronic anemia of renal disease:Hgb stable, no signs of bleeding. Anticoagulation on hold for anticipated procedures but plan to resume soon.  Chronic diastolic heart failure-Held furosemide, losartan and carvedilolon admissiondue to relative hypotension.Recieved judicious IV fluid hydration(now off).Resumed Lasix and b-blockers now.   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 He had poor oral intake and acute renal failure while here.Held oral hypoglycemic agent.Blood glucose controlled with sliding scale,10units Lantus nightly while here but may resume home regimen upon discharge per oral intake.  Obesity, sleep apnea:Body mass index is 38.5 kg/m.  Likely contributing to sleep apnea.  Patient reports using CPAP at 11 mmHg at home.  Resume on discharge  Weakness/deconditioning: in the setting of acute illness, fell at home. Seen by PT-HH recommended. Patient had nephrostomy tubes in the past and is familiar with home care needs but his friend who is the primary caregiver apparently had a fall resulting in rib fractures and is recovering. [facility] RN referral placed to assist patient upon discharge.   Discharge Exam:   Vitals:   11/18/19 0554 11/18/19 0921 11/18/19 1133 11/18/19 1242  BP: (!) 118/59 130/62 (!) 154/67 120/66  Pulse: 76 81 81 77  Resp: 16 16 16 16   Temp: 97.6 F (36.4 C) 97.6 F (36.4 C) 97.6 F  (36.4 C) 98.3 F (36.8 C)  TempSrc:   Oral   SpO2: 100% 100% 99% 100%  Weight:      Height:        General: Pt is alert, awake, not in acute distress Cardiovascular: RRR, S1/S2 +, no rubs, no gallops Respiratory: CTA bilaterally, no wheezing, no rhonchi Abdominal:  Obese, NT, ND, bowel sounds + Now s/p nephrostomy to left draining clear urine Extremities: chronic venous stasis edema/hyperpigmentation, no cyanosis  Discharge Condition:Stable CODE STATUS: Full code Diet recommendation: low salt , diabetic diet Recommendations for Outpatient Follow-up:  1. Follow up with PCP: 5-7 days  2. Follow up with consultants: Urology clinic in 1-2 weeks 3. Please obtain follow up labs including: BMP in 5 days  Home Health services upon discharge: yes Equipment/Devices upon discharge:none   Discharge Instructions:  Discharge Instructions    ([place]) Call MD:  Anytime you have any of the following symptoms: 1) 3 pound weight gain in 24 hours or 5 pounds in 1 week 2) shortness of breath, with or without a dry hacking cough 3) swelling in the hands, feet or stomach 4) if you have to sleep on extra pillows at night in order to breathe.   Complete by: As directed    Call MD for:   Complete by: As directed    Change in nephrostomy output   Call MD for:  extreme fatigue   Complete by: As directed    Call MD for:  persistant dizziness or light-headedness   Complete by: As directed    Call MD for:  redness, tenderness, or signs of infection (pain, swelling, redness, odor or green/yellow discharge around incision site)   Complete by: As directed    Call MD for:  severe uncontrolled pain   Complete by: As directed    Call MD for:  temperature >100.4   Complete by: As directed  Change dressing (specify)   Complete by: As directed    Dressing change daily as instructed by urology   Diet - low sodium heart healthy   Complete by: As directed    Increase activity slowly    Complete by: As directed      Allergies as of 11/18/2019      Reactions   Other Hives   Blue cheese      Medication List    STOP taking these medications   losartan 25 MG tablet Commonly known as: COZAAR   sulfamethoxazole-trimethoprim 800-160 MG tablet Commonly known as: BACTRIM DS     TAKE these medications   amiodarone 200 MG tablet Commonly known as: PACERONE Take 1 tablet (200 mg total) by mouth daily. What changed: when to take this   apixaban 5 MG Tabs tablet Commonly known as: Eliquis Take 1 tablet (5 mg total) 2 (two) times daily by mouth.   b complex vitamins tablet Take 1 tablet by mouth every evening.   baclofen 10 MG tablet Commonly known as: LIORESAL Take 10 mg by mouth every evening.   carvedilol 6.25 MG tablet Commonly known as: COREG TAKE 1 TABLET BY MOUTH TWICE DAILY   cephALEXin 500 MG capsule Commonly known as: KEFLEX Take 1 capsule (500 mg total) by mouth 3 (three) times daily for 5 days. What changed:   medication strength  how much to take  when to take this   clonazePAM 1 MG tablet Commonly known as: KLONOPIN Take 1 mg by mouth at bedtime.   CoQ10 400 MG Caps Take 400 mg by mouth 2 (two) times daily.   dextromethorphan-guaiFENesin 30-600 MG 12hr tablet Commonly known as: MUCINEX DM Take 1 tablet by mouth 2 (two) times daily.   docusate sodium 100 MG capsule Commonly known as: COLACE Take 1 capsule (100 mg total) by mouth 2 (two) times daily. What changed:   how much to take  when to take this  additional instructions   furosemide 80 MG tablet Commonly known as: LASIX Take 1 tablet (80 mg total) by mouth daily.   glipiZIDE 10 MG tablet Commonly known as: GLUCOTROL Take 10 mg by mouth 2 (two) times daily.   Hawthorne Berry 550 MG Caps Take 550 mg by mouth every evening.   HYDROcodone-acetaminophen 5-325 MG tablet Commonly known as: NORCO/VICODIN Take 1 tablet by mouth every 6 (six) hours as needed for moderate  pain.   Jardiance 10 MG Tabs tablet Generic drug: empagliflozin Take 10 mg by mouth every evening.   magnesium oxide 400 MG tablet Commonly known as: MAG-OX Take 400 mg by mouth every morning.   montelukast 10 MG tablet Commonly known as: SINGULAIR Take 10 mg by mouth at bedtime.   oxybutynin 5 MG tablet Commonly known as: DITROPAN Take 1 tablet (5 mg total) by mouth every 8 (eight) hours as needed for bladder spasms.   Ozempic (1 MG/DOSE) 2 MG/1.5ML Sopn Generic drug: Semaglutide (1 MG/DOSE) Inject 1 mg into the skin every Monday. In the evening.   polyethylene glycol 17 g packet Commonly known as: MIRALAX / GLYCOLAX Take 17 g by mouth daily as needed for mild constipation.   PRESERVISION AREDS 2 PO Take 1 tablet by mouth 2 (two) times daily.   rosuvastatin 20 MG tablet Commonly known as: CRESTOR Take 20 mg by mouth every evening.   Systane 0.4-0.3 % Soln Generic drug: Polyethyl Glycol-Propyl Glycol Place 1 drop into both eyes 3 (three) times daily as needed (dry/irritated eyes.).  tamsulosin 0.4 MG Caps capsule Commonly known as: FLOMAX Take 1 capsule (0.4 mg total) by mouth daily.   Toujeo Max SoloStar 300 UNIT/ML Solostar Pen Generic drug: insulin glargine (2 Unit Dial) Inject 40 Units into the skin every morning. Titrate up to 60 units/day, per endocrinology instructions.   vitamin C 1000 MG tablet Take 1,000 mg by mouth daily.   Vitamin D (Ergocalciferol) 1.25 MG (50000 UNIT) Caps capsule Commonly known as: DRISDOL Take 50,000 Units by mouth every Monday.            Discharge Care Instructions  (From admission, onward)         Start     Ordered   11/18/19 0000  Change dressing (specify)       Comments: Dressing change daily as instructed by urology   11/18/19 1157          Allergies  Allergen Reactions  . Other Hives    Blue cheese      The results of significant diagnostics from this hospitalization (including imaging,  microbiology, ancillary and laboratory) are listed below for reference.    Labs: BNP (last 3 results) No results for input(s): BNP in the last 8760 hours. Basic Metabolic Panel: Recent Labs  Lab 11/13/19 0602 11/14/19 0446 11/15/19 0605 11/16/19 0553 11/18/19 0421  NA 131* 131* 131* 132* 133*  K 3.7 3.9 4.0 4.5 3.9  CL 96* 96* 98 97* 97*  CO2 25 23 23 25 26   GLUCOSE 162* 142* 149* 153* 153*  BUN 55* 50* 45* 39* 44*  CREATININE 2.94* 2.57* 2.31* 2.24* 2.34*  CALCIUM 7.9* 8.2* 8.2* 8.3* 8.2*   Liver Function Tests: No results for input(s): AST, ALT, ALKPHOS, BILITOT, PROT, ALBUMIN in the last 168 hours. No results for input(s): LIPASE, AMYLASE in the last 168 hours. No results for input(s): AMMONIA in the last 168 hours. CBC: Recent Labs  Lab 11/12/19 1021 11/13/19 0602 11/14/19 0446 11/18/19 0742  WBC 10.9* 7.8 9.3  --   NEUTROABS  --   --  7.2  --   HGB 11.0* 10.1* 10.1* 9.7*  HCT 33.3* 30.9* 31.1* 29.1*  MCV 89.5 90.4 90.1  --   PLT 191 190 195  --    Cardiac Enzymes: No results for input(s): CKTOTAL, CKMB, CKMBINDEX, TROPONINI in the last 168 hours. BNP: Invalid input(s): POCBNP CBG: Recent Labs  Lab 11/17/19 1227 11/17/19 1802 11/17/19 2037 11/18/19 0738 11/18/19 1124  GLUCAP 192* 179* 224* 139* 217*   D-Dimer No results for input(s): DDIMER in the last 72 hours. Hgb A1c No results for input(s): HGBA1C in the last 72 hours. Lipid Profile No results for input(s): CHOL, HDL, LDLCALC, TRIG, CHOLHDL, LDLDIRECT in the last 72 hours. Thyroid function studies No results for input(s): TSH, T4TOTAL, T3FREE, THYROIDAB in the last 72 hours.  Invalid input(s): FREET3 Anemia work up No results for input(s): VITAMINB12, FOLATE, FERRITIN, TIBC, IRON, RETICCTPCT in the last 72 hours. Urinalysis    Component Value Date/Time   COLORURINE YELLOW (A) 11/12/2019 1020   APPEARANCEUR CLOUDY (A) 11/12/2019 1020   APPEARANCEUR Cloudy (A) 11/11/2019 1252   LABSPEC  1.013 11/12/2019 1020   PHURINE 5.0 11/12/2019 1020   GLUCOSEU >=500 (A) 11/12/2019 1020   HGBUR NEGATIVE 11/12/2019 1020   BILIRUBINUR NEGATIVE 11/12/2019 1020   BILIRUBINUR Negative 11/11/2019 1252   KETONESUR NEGATIVE 11/12/2019 1020   PROTEINUR 30 (A) 11/12/2019 1020   NITRITE NEGATIVE 11/12/2019 1020   LEUKOCYTESUR TRACE (A) 11/12/2019 1020  Sepsis Labs Invalid input(s): PROCALCITONIN,  WBC,  LACTICIDVEN Microbiology Recent Results (from the past 240 hour(s))  CULTURE, URINE COMPREHENSIVE     Status: None   Collection Time: 11/11/19 12:52 PM   Specimen: Urine   UR  Result Value Ref Range Status   Urine Culture, Comprehensive Final report  Final   Organism ID, Bacteria Comment  Final    Comment: Mixed urogenital flora 10,000-25,000 colony forming units per mL   Microscopic Examination     Status: Abnormal   Collection Time: 11/11/19 12:52 PM   Urine  Result Value Ref Range Status   WBC, UA 6-10 (A) 0 - 5 /hpf Final   RBC 0-2 0 - 2 /hpf Final   Epithelial Cells (non renal) 0-10 0 - 10 /hpf Final   Casts Present (A) None seen /lpf Final   Cast Type Granular casts (A) N/A Final   Crystals Present (A) N/A Final   Crystal Type Amorphous Sediment N/A Final   Bacteria, UA Few None seen/Few Final  SARS Coronavirus 2 by RT PCR (hospital order, performed in [facility] hospital lab) Nasopharyngeal Nasopharyngeal Swab     Status: None   Collection Time: 11/12/19  3:50 PM   Specimen: Nasopharyngeal Swab  Result Value Ref Range Status   SARS Coronavirus 2 NEGATIVE NEGATIVE Final    Comment: (NOTE) SARS-CoV-2 target nucleic acids are NOT DETECTED.  The SARS-CoV-2 RNA is generally detectable in upper and lower respiratory specimens during the acute phase of infection. The lowest concentration of SARS-CoV-2 viral copies this assay can detect is 250 copies / mL. A negative result does not preclude SARS-CoV-2 infection and should not be used as the sole basis for treatment or  other patient management decisions.  A negative result may occur with improper specimen collection / handling, submission of specimen other than nasopharyngeal swab, presence of viral mutation(s) within the areas targeted by this assay, and inadequate number of viral copies (<250 copies / mL). A negative result must be combined with clinical observations, patient history, and epidemiological information.  Fact Sheet for Patients:   StrictlyIdeas.no  Fact Sheet for Healthcare Providers: BankingDealers.co.za  This test is not yet approved or  cleared by the [place] FDA and has been authorized for detection and/or diagnosis of SARS-CoV-2 by FDA under an Emergency Use Authorization (EUA).  This EUA will remain in effect (meaning this test can be used) for the duration of the COVID-19 declaration under Section 564(b)(1) of the Act, 21 U.S.C. section 360bbb-3(b)(1), unless the authorization is terminated or revoked sooner.  Performed at [facility], [address] [place], [place] [address]   Urine Culture     Status: Abnormal   Collection Time: 11/13/19  8:42 AM   Specimen: Urine, Random  Result Value Ref Range Status   Specimen Description   Final    URINE, RANDOM Performed at [facility], [address], [place], [place] [address]    Special Requests   Final    NONE Performed at [facility], [address] [place], [place] [address]    Culture (A)  Final    <10,000 COLONIES/mL INSIGNIFICANT GROWTH Performed at [facility], [place] [address], [place], [place] [address]    Report Status 11/14/2019 FINAL  Final  CULTURE, BLOOD (ROUTINE X 2) w Reflex to ID Panel     Status: None   Collection Time: 11/13/19 10:52 AM   Specimen: BLOOD  Result Value Ref Range Status  Specimen Description BLOOD RIGHT ANTECUBITAL  Final   Special Requests   Final    BOTTLES DRAWN AEROBIC AND ANAEROBIC Blood  Culture results may not be optimal due to an excessive volume of blood received in culture bottles   Culture   Final    NO GROWTH 5 DAYS Performed at [facility], [address] [place], [place] [address]    Report Status 11/18/2019 FINAL  Final  CULTURE, BLOOD (ROUTINE X 2) w Reflex to ID Panel     Status: None   Collection Time: 11/13/19 11:00 AM   Specimen: BLOOD  Result Value Ref Range Status   Specimen Description BLOOD BLOOD RIGHT HAND  Final   Special Requests   Final    BOTTLES DRAWN AEROBIC AND ANAEROBIC Blood Culture adequate volume   Culture   Final    NO GROWTH 5 DAYS Performed at [facility], [address], [place], [place] [address]    Report Status 11/18/2019 FINAL  Final  Urine culture     Status: None   Collection Time: 11/16/19 12:10 PM   Specimen: Urine, Catheterized  Result Value Ref Range Status   Specimen Description   Final    URINE, CATHETERIZED Performed at [facility], [address], [place], [place] [address]    Special Requests   Final    [facility] TUBE Performed at [facility], [address] [place], [place] [address]    Culture   Final    NO GROWTH Performed at [facility], [place] [address] [place], [place] [address]    Report Status 11/17/2019 FINAL  Final    Procedures/Studies: DG Abd 1 View  Result Date: 11/12/2019 CLINICAL DATA:  Flank pain. Nausea and decreased appetite for 2 days. Concern for kidney stones. EXAM: ABDOMEN - 1 VIEW COMPARISON:  05/07/2019 CT.  Plain film 09/19/2019. FINDINGS: Nonobstructive bowel gas pattern. A large amount of colonic stool, including projecting over the left kidney. The previously described lower pole left renal collecting system calculus may be identified on the second image, 4.2 cm. IMPRESSION: Large colonic stool volume, suggesting constipation. The previously described lower pole left renal collecting system dominant stone is partially obscured, favored to  be visualized. Electronically Signed   By: Abigail Miyamoto M.D.   On: 11/12/2019 09:34   [place] Intraoperative  Result Date: 11/16/2019 CLINICAL DATA:  Ultrasound was provided for use by the ordering physician, and a technical charge was applied by the performing facility.  No radiologist interpretation/professional services rendered.   CT Renal Stone Study  Result Date: 11/12/2019 CLINICAL DATA:  Flank pain and fevers EXAM: CT ABDOMEN AND PELVIS WITHOUT CONTRAST TECHNIQUE: Multidetector CT imaging of the abdomen and pelvis was performed following the standard protocol without IV contrast. COMPARISON:  05/07/2019 FINDINGS: Lower chest: No acute abnormality. Hepatobiliary: No focal liver abnormality is seen. No gallstones, gallbladder wall thickening, or biliary dilatation. Pancreas: Unremarkable. No pancreatic ductal dilatation or surrounding inflammatory changes. Spleen: Normal in size without focal abnormality. Adrenals/Urinary Tract: Adrenal glands are within normal limits. Right kidney is unremarkable without obstructive change. Left kidney demonstrates hydronephrosis increased from the prior exam with evidence of hydroureter. In the distal left ureter above the ureterovesical junction there is a 15 mm stone identified causing the obstructive change. Multiple calculi are noted within the left kidney the largest of which measures 4.3 cm. Considerable cortical thinning is noted bladder is partially distended. Stomach/Bowel: Scattered diverticular change of the colon is noted without  evidence of diverticulitis. The appendix has been surgically removed. Small bowel is unremarkable. No gastric abnormality is seen. Vascular/Lymphatic: Aortic atherosclerosis. No enlarged abdominal or pelvic lymph nodes. Reproductive: Prostate is unremarkable. Other: No abdominal wall hernia or abnormality. No abdominopelvic ascites. Musculoskeletal: No acute or significant osseous findings. IMPRESSION: Interval migration of a 15 mm  calculus into the distal left ureter with significant hydronephrosis identified. Perinephric stranding is noted as well suspicious for underlying superimposed infection. Multiple large calculi within the left kidney the largest of which measures 4.3 cm in greatest dimension. Electronically Signed   By: Inez Catalina M.D.   On: 11/12/2019 14:42   IR IMAGE GUIDED DRAINAGE BY PERCUTANEOUS CATHETER  Result Date: 11/16/2019 INDICATION: History of urosepsis secondary to obstructive nephrolithiasis. Request made for placement of a left-sided percutaneous nephrostomy catheter for infection source control purposes. EXAM: 1. ULTRASOUND GUIDANCE FOR PUNCTURE OF THE LEFT RENAL COLLECTING SYSTEM 2. LEFT PERCUTANEOUS NEPHROSTOMY TUBE PLACEMENT. COMPARISON:  CT abdomen and pelvis-11/12/2019 MEDICATIONS: Patient is currently admitted to the hospital receiving intravenous antibiotics; the antibiotic was administered in an appropriate time frame prior to skin puncture. ANESTHESIA/SEDATION: Moderate (conscious) sedation was employed during this procedure. A total of Versed 1 mg and Fentanyl 50 mcg was administered intravenously. Moderate Sedation Time: 16 minutes. The patient's level of consciousness and vital signs were monitored continuously by radiology nursing throughout the procedure under my direct supervision. CONTRAST:  10 mL Visipaque 320-administered into the renal collecting system FLUOROSCOPY TIME:  54 seconds (15.0 mGy) COMPLICATIONS: None immediate. PROCEDURE: The procedure, risks, benefits, and alternatives were explained to the patient. Questions regarding the procedure were encouraged and answered. The patient understands and consents to the procedure. A timeout was performed prior to the initiation of the procedure. The left flank region was prepped and draped in the usual sterile fashion and a sterile drape was applied covering the operative field. A sterile gown and sterile gloves were used for the procedure.  Local anesthesia was provided with 1% Lidocaine with epinephrine. Ultrasound was used to localize the left kidney. Under direct ultrasound guidance, a 20 gauge needle was advanced into the renal collecting system. An ultrasound image documentation was performed. Access within the collecting system was confirmed with the efflux of urine followed by limited contrast injection. Over a Nitrex wire, the tract was dilated with an Accustick stent. Next, under intermittent fluoroscopic guidance and over a short Amplatz wire, the track was dilated ultimately allowing placement of a 10-French percutaneous nephrostomy catheter which was advanced to the level of the renal pelvis where the coil was formed and locked. Contrast was injected and several spot fluoroscopic images were obtained in various obliquities. The catheter was secured at the skin with a Prolene retention suture and stat lock device and connected to a gravity bag was placed. Next, approximately 500 cc of purulent appearing urine was aspirated. A representative sample of aspirated urine was capped and sent to the laboratory for analysis Dressings were applied. The patient tolerated procedure well without immediate postprocedural complication. FINDINGS: Ultrasound scanning demonstrates severe dilatation of the left renal collecting system with lack of significant identifiable renal cortex potentially suggestive of very limited function of the left kidney. Under a combination of ultrasound and fluoroscopic guidance, the left renal collecting system was successfully accessed and a 10-French percutaneous nephrostomy catheter with end coiled and locked within the renal pelvis. Contrast injection confirmed appropriate positioning. Next, approximately 500 cc of purulent appearing urine was aspirated. IMPRESSION: 1. Successful ultrasound and fluoroscopic guided  placement of a left sided 10 [place] PCN. 2. Approximately 500 cc of purulent appearing urine was aspirated  following PCN placement. A representative sample of aspirated urine was capped and sent to the laboratory for analysis. 3. Sonographic evaluation demonstrates an extremely limited amount of identifiable renal cortex as could be seen in the setting of a nonfunctional kidney. After the resolution of acute symptoms, further evaluation with nuclear medicine renal scintigraphy could be performed to evaluate the functionality of the left kidney as indicated. Electronically Signed   By: Sandi Mariscal M.D.   On: 11/16/2019 12:55    Time coordinating discharge: Over 30 minutes  SIGNED:   Guilford Shi, MD  [facility] 11/18/2019, 1:39 PM

## 2019-11-18 NOTE — Telephone Encounter (Signed)
   [facility] Pre-operative Risk Assessment    HEARTCARE STAFF: - Please ensure there is not already an duplicate clearance open for this procedure. - Under Visit Info/Reason for Call, type in Other and utilize the format Clearance MM/DD/YY or Clearance TBD. Do not use dashes or single digits. - If request is for dental extraction, please clarify the # of teeth to be extracted.  Request for surgical clearance:  1. What type of surgery is being performed? Left robotic nephrectomy    2. When is this surgery scheduled? 12/10/19  3. What type of clearance is required (medical clearance vs. Pharmacy clearance to hold med vs. Both)? both  4. Are there any medications that need to be held prior to surgery and how long? Hold Eliquis x 3 days prior to surgery  5. Practice name and name of physician performing surgery? [facility] - Dr Hollice Espy   6. What is the office phone number? [phone]   7.   What is the office fax number? [phone]  8.   Anesthesia type (None, local, MAC, general) ? General    [name] 11/18/2019, 3:34 PM  _________________________________________________________________   (provider comments below)

## 2019-11-19 ENCOUNTER — Ambulatory Visit: Payer: BLUE CROSS/BLUE SHIELD

## 2019-11-19 ENCOUNTER — Other Ambulatory Visit: Payer: Self-pay | Admitting: Radiology

## 2019-11-19 DIAGNOSIS — N261 Atrophy of kidney (terminal): Secondary | ICD-10-CM

## 2019-11-19 NOTE — Telephone Encounter (Signed)
Pt has been scheduled to see Ignacia Bayley, NP 12/02/19 for pre op clearance. I will forward clearance notes to NP for upcoming appt. I will send FYI to requesting office pt has appt with cardiologist. I will remove from the pre op call back pool.

## 2019-11-19 NOTE — Telephone Encounter (Signed)
Tried to reach the pt so that we may move his appt with Dr. Saunders Revel up sooner, for pre op clearance. Left message for the pt Dr. Saunders Revel has availability 11/26/19 to please call the office ASAP so that we can get him sooner for pre op clearance appt.

## 2019-11-19 NOTE — Telephone Encounter (Signed)
Patient has scheduled for 6/30 at 10:30. Declined the earlier appointment due to a PCP appointment

## 2019-11-19 NOTE — Telephone Encounter (Signed)
Patient with diagnosis of afib on Eliquis for anticoagulation.    Procedure: left robotic nephrectomy Date of procedure: 12/10/19  CHADS2-VASc score of 5 (age, CHF, HTN, DM, CAD)  CrCl 69mL/min using adjusted body weight Platelet count 195K  Per office protocol, patient can hold Eliquis for 3 days prior to procedure as requested.

## 2019-11-19 NOTE — Telephone Encounter (Signed)
Primary Cardiologist:Jim End, MD  Chart reviewed as part of pre-operative protocol coverage. Because of [name]'s past medical history and time since last visit, he/she will require a follow-up visit in order to better assess preoperative cardiovascular risk.  Pre-op covering staff: - Please schedule appointment and call patient to inform them. - Please contact requesting surgeon's office via preferred method (i.e, phone, fax) to inform them of need for appointment prior to surgery.  If applicable, this message will also be routed to pharmacy pool and/or primary cardiologist for input on holding anticoagulant/antiplatelet agent as requested below so that this information is available at time of patient's appointment.   Jim Pelton, NP  11/19/2019, 7:46 AM

## 2019-11-24 ENCOUNTER — Ambulatory Visit: Payer: BLUE CROSS/BLUE SHIELD | Admitting: Physician Assistant

## 2019-11-27 ENCOUNTER — Other Ambulatory Visit: Payer: Self-pay

## 2019-11-27 ENCOUNTER — Other Ambulatory Visit: Payer: Self-pay | Admitting: *Deleted

## 2019-11-27 ENCOUNTER — Ambulatory Visit (INDEPENDENT_AMBULATORY_CARE_PROVIDER_SITE_OTHER): Payer: Medicare Other | Admitting: Urology

## 2019-11-27 ENCOUNTER — Encounter: Payer: Self-pay | Admitting: Urology

## 2019-11-27 ENCOUNTER — Ambulatory Visit: Payer: BLUE CROSS/BLUE SHIELD | Admitting: Urology

## 2019-11-27 VITALS — BP 132/80 | HR 78 | Ht 72.0 in | Wt 270.0 lb

## 2019-11-27 DIAGNOSIS — N261 Atrophy of kidney (terminal): Secondary | ICD-10-CM | POA: Diagnosis not present

## 2019-11-27 DIAGNOSIS — N200 Calculus of kidney: Secondary | ICD-10-CM

## 2019-11-27 DIAGNOSIS — N183 Chronic kidney disease, stage 3 unspecified: Secondary | ICD-10-CM

## 2019-11-27 DIAGNOSIS — N1330 Unspecified hydronephrosis: Secondary | ICD-10-CM | POA: Diagnosis not present

## 2019-11-27 NOTE — Progress Notes (Signed)
11/27/19 11:13 AM   [name] Sep 28, 1950 [id number]  Referring provider: Vidal Schwalbe, MD [address] [place],  [place] [address] Chief Complaint  Patient presents with  . Follow-up    Discuss Surgery/CT scan results    HPI: [name] is a 69 y.o. M with a history of severe chronic left hydronephrosis, left renal atrophy, nephrolithiasis and CKD returns today to discuss surgery/CT scan results.   CT Renal stone study from 11/12/19 revealed interval migration of a 15 mm calculus into the distal left ureter with significant hydronephrosis identified. Perinephric stranding is noted as well suspicious for underlying superimposed infection. Multiple large calculi within the left kidney the largest of which measures 4.3 cm in greatest dimension.  He was admitted to the hospitalist service for  IV antibiotics and supportive care.  He continues to have significant pain.  He ultimately needs nephrostomy tube placement..  All urine cultures include were negative.  Creatinine 1.5 on labs down by PCP a few days ago.    No prior hx of abdominal surgeries.   He returns today to discuss options for management of his left-sided nephrolithiasis.  [name] on multiple stone procedures with recent left stone burden and is severely atrophic left kidney.  Notes for details.  At this point time, he is considering nephrectomy.  [name] scheduled to see cardiology next week for preoperative clearance.  PMH: Past Medical History:  Diagnosis Date  . Cardiomyopathy ([place])   . Chronic combined systolic (congestive) and diastolic (congestive) heart failure (HCC)    a. EF 25% by cath in 2013 b. Echo in 07/2015 showing EF of 15-20%, moderate MR, moderate Pulm HTN, severely dilated IVC  . CKD (chronic kidney disease) stage 3, GFR 30-59 ml/min   . Coronary artery disease   . Diabetes mellitus type 2, uncontrolled ([place])    a. A1c 11.0 in 07/2015.  [name] Hypertension   . Kidney stones    Left  .  New onset atrial fibrillation ([place])    a. diagnosed in 07/2015 b. started on Eliquis  . Paroxysmal atrial fibrillation (HCC)   . Pollen allergies 09-21-15   pt called and stated that he woke up and had some drainage-pt states he did not see the color of the drainage-pt denies running a fever and this only happened once-pt instructed to call Dr Audree Bane office if he starts running a fever or if the color of drainage becomes yellow/green  . Psoriasis   . Pulmonary hypertension ([place])   . Renal disorder    kidney stone  . Sleep apnea     Surgical History: Past Surgical History:  Procedure Laterality Date  . CARDIAC CATHETERIZATION N/A 07/25/2015   Procedure: Right/Left Heart Cath and Coronary Angiography;  Surgeon: Wellington Hampshire, MD;  Location: [facility];  Service: Cardiovascular;  Laterality: N/A;  . CARDIAC CAT[place]TER[place]     [place]  . CARDIAC CATHETERIZATION     [facility] [place]    . CYSTOSCOPY WITH STENT PLACEMENT Left 09/07/2015   Procedure: CYSTOSCOPY WITH STENT PLACEMENT;  Surgeon: Hollice Espy, MD;  Location: [facility];  Service: Urology;  Laterality: Left;  . CYSTOSCOPY/URETEROSCOPY/HOLMIUM LASER/STENT PLACEMENT Left 10/25/2015   Procedure: CYSTOSCOPY/URETEROSCOPY/HOLMIUM LASER/STENT EXCHANGE;  Surgeon: Hollice Espy, MD;  Location: [facility];  Service: Urology;  Laterality: Left;  . CYSTOSCOPY/URETEROSCOPY/HOLMIUM LASER/STENT PLACEMENT Left 05/11/2019   Procedure: CYSTOSCOPY/URETEROSCOPY/HOLMIUM LASER/STENT PLACEMENT;  Surgeon: Hollice Espy, MD;  Location: [facility];  Service: Urology;  Laterality: Left;  . EXTRACORPOREAL SHOCK WAVE LITHOTRIPSY Left 04/02/2019   Procedure: EXTRACORPOREAL SHOCK WAVE LITHOTRIPSY (ESWL);  Surgeon: Hollice Espy, MD;  Location: [facility];  Service: Urology;  Laterality: Left;  . EXTRACORPOREAL SHOCK WAVE LITHOTRIPSY Left 03/05/2019   Procedure: EXTRACORPOREAL SHOCK WAVE LITHOTRIPSY  (ESWL);  Surgeon: Abbie Sons, MD;  Location: [facility];  Service: Urology;  Laterality: Left;  . KIDNEY SURGERY    . NEPHROSTOMY TUBE PLACEMENT ([place] HX) Left   . URETEROSCOPY WITH HOLMIUM LASER LITHOTRIPSY Left 09/07/2015   Procedure: URETEROSCOPY WITH HOLMIUM LASER LITHOTRIPSY;  Surgeon: Hollice Espy, MD;  Location: [facility];  Service: Urology;  Laterality: Left;    Home Medications:  Allergies as of 11/27/2019      Reactions   Other Hives   Blue cheese      Medication List       Accurate as of November 27, 2019 11:59 PM. If you have any questions, ask your nurse or doctor.        STOP taking these medications   furosemide 80 MG tablet Commonly known as: LASIX Stopped by: Hollice Espy, MD     TAKE these medications   amiodarone 200 MG tablet Commonly known as: PACERONE Take 1 tablet (200 mg total) by mouth daily.   apixaban 5 MG Tabs tablet Commonly known as: Eliquis Take 1 tablet (5 mg total) 2 (two) times daily by mouth.   b complex vitamins tablet Take 1 tablet by mouth every evening.   baclofen 10 MG tablet Commonly known as: LIORESAL Take 10 mg by mouth every evening.   carvedilol 6.25 MG tablet Commonly known as: COREG TAKE 1 TABLET BY MOUTH TWICE DAILY   clonazePAM 1 MG tablet Commonly known as: KLONOPIN Take 1 mg by mouth at bedtime.   CoQ10 400 MG Caps Take 400 mg by mouth 2 (two) times daily.   dextromethorphan-guaiFENesin 30-600 MG 12hr tablet Commonly known as: MUCINEX DM Take 1 tablet by mouth 2 (two) times daily.   docusate sodium 100 MG capsule Commonly known as: COLACE Take 1 capsule (100 mg total) by mouth 2 (two) times daily.   glipiZIDE 10 MG tablet Commonly known as: GLUCOTROL Take 10 mg by mouth 2 (two) times daily.   Hawthorne Berry 550 MG Caps Take 550 mg by mouth every evening.   HYDROcodone-acetaminophen 5-325 MG tablet Commonly known as: NORCO/VICODIN Take 1 tablet by mouth every 6 (six) hours as needed for moderate  pain.   Jardiance 10 MG Tabs tablet Generic drug: empagliflozin Take 10 mg by mouth every evening.   magnesium oxide 400 MG tablet Commonly known as: MAG-OX Take 400 mg by mouth every morning.   montelukast 10 MG tablet Commonly known as: SINGULAIR Take 10 mg by mouth at bedtime.   oxybutynin 5 MG tablet Commonly known as: DITROPAN Take 1 tablet (5 mg total) by mouth every 8 (eight) hours as needed for bladder spasms.   Ozempic (1 MG/DOSE) 2 MG/1.5ML Sopn Generic drug: Semaglutide (1 MG/DOSE) Inject 1 mg into the skin every Monday. In the evening.   polyethylene glycol 17 g packet Commonly known as: MIRALAX / GLYCOLAX Take 17 g by mouth daily as needed for mild constipation.   PRESERVISION AREDS 2 PO Take 1 tablet by mouth 2 (two) times daily.   rosuvastatin 20 MG tablet Commonly known as: CRESTOR Take 20 mg by mouth every evening.   Systane 0.4-0.3 % Soln Generic drug: Polyethyl Glycol-Propyl Glycol Place  1 drop into both eyes 3 (three) times daily as needed (dry/irritated eyes.).   tamsulosin 0.4 MG Caps capsule Commonly known as: FLOMAX Take 1 capsule (0.4 mg total) by mouth daily.   torsemide 20 MG tablet Commonly known as: DEMADEX Take 20 mg by mouth 2 (two) times daily.   Toujeo Max SoloStar 300 UNIT/ML Solostar Pen Generic drug: insulin glargine (2 Unit Dial) Inject 40 Units into the skin every morning. Titrate up to 60 units/day, per endocrinology instructions.   vitamin C 1000 MG tablet Take 1,000 mg by mouth daily.   Vitamin D (Ergocalciferol) 1.25 MG (50000 UNIT) Caps capsule Commonly known as: DRISDOL Take 50,000 Units by mouth every Monday.       Allergies:  Allergies  Allergen Reactions  . Other Hives    Blue cheese    Family History: Family History  Problem Relation Age of Onset  . Heart failure Father   . Heart attack Brother   . Kidney cancer Neg Hx   . Bladder Cancer Neg Hx   . Prostate cancer Neg Hx     Social History:   reports that he has never smoked. He has never used smokeless tobacco. He reports current alcohol use. He reports that he does not use drugs.   Physical Exam: BP 132/80   Pulse 78   Ht 6' (1.829 m)   Wt 270 lb (122.5 kg)   BMI 36.62 kg/m   Constitutional:  Alert and oriented, No acute distress. HEENT: [place] AT, moist mucus membranes.  Trachea midline, no masses. Cardiovascular: No clubbing, cyanosis, or edema. Respiratory: Normal respiratory effort, no increased work of breathing. Abdomen: Obese, no hernias appreciated Skin: No rashes, bruises or suspicious lesions. Neurologic: Grossly intact, no focal deficits, moving all 4 extremities. Psychiatric: Normal mood and affect.  Laboratory Data:  Lab Results  Component Value Date   CREATININE 2.34 (H) 11/18/2019    Lab Results  Component Value Date   HGBA1C 6.3 (H) 11/12/2019   Pertinent Imaging: Results for orders placed in visit on 10/23/19  DG Abd 1 View  Narrative CLINICAL DATA:  Flank pain. Nausea and decreased appetite for 2 days. Concern for kidney stones.  EXAM: ABDOMEN - 1 VIEW  COMPARISON:  05/07/2019 CT.  Plain film 09/19/2019.  FINDINGS: Nonobstructive bowel gas pattern. A large amount of colonic stool, including projecting over the left kidney. The previously described lower pole left renal collecting system calculus may be identified on the second image, 4.2 cm.  IMPRESSION: Large colonic stool volume, suggesting constipation.  The previously described lower pole left renal collecting system dominant stone is partially obscured, favored to be visualized.   Electronically Signed By: Abigail Miyamoto M.D. On: 11/12/2019 09:34  Results for orders placed during the hospital encounter of 11/12/19  CT Renal Stone Study  Narrative CLINICAL DATA:  Flank pain and fevers  EXAM: CT ABDOMEN AND PELVIS WITHOUT CONTRAST  TECHNIQUE: Multidetector CT imaging of the abdomen and pelvis was performed following  the standard protocol without IV contrast.  COMPARISON:  05/07/2019  FINDINGS: Lower chest: No acute abnormality.  Hepatobiliary: No focal liver abnormality is seen. No gallstones, gallbladder wall thickening, or biliary dilatation.  Pancreas: Unremarkable. No pancreatic ductal dilatation or surrounding inflammatory changes.  Spleen: Normal in size without focal abnormality.  Adrenals/Urinary Tract: Adrenal glands are within normal limits. Right kidney is unremarkable without obstructive change. Left kidney demonstrates hydronephrosis increased from the prior exam with evidence of hydroureter. In the distal left ureter above  the ureterovesical junction there is a 15 mm stone identified causing the obstructive change. Multiple calculi are noted within the left kidney the largest of which measures 4.3 cm. Considerable cortical thinning is noted bladder is partially distended.  Stomach/Bowel: Scattered diverticular change of the colon is noted without evidence of diverticulitis. The appendix has been surgically removed. Small bowel is unremarkable. No gastric abnormality is seen.  Vascular/Lymphatic: Aortic atherosclerosis. No enlarged abdominal or pelvic lymph nodes.  Reproductive: Prostate is unremarkable.  Other: No abdominal wall hernia or abnormality. No abdominopelvic ascites.  Musculoskeletal: No acute or significant osseous findings.  IMPRESSION: Interval migration of a 15 mm calculus into the distal left ureter with significant hydronephrosis identified. Perinephric stranding is noted as well suspicious for underlying superimposed infection.  Multiple large calculi within the left kidney the largest of which measures 4.3 cm in greatest dimension.   Electronically Signed By: Inez Catalina M.D. On: 11/12/2019 14:42  I have personally reviewed the images and agree with radiologist interpretation.   Assessment & Plan:    1. Flank pain w/ hx of urolithiasis    Multiple recurrent stone episodes on the left As per previous conversations, this point  Discussed performing simple nephrectomy (robotic assisted) w/ associated risks and benefits including risk of bleeding, infection, pain, and damage to surrounding structures. Discussed ureteral stone, will consider ureteroscopy if issues arise to treat possible retained distal stone if unable to remove at time of nephrectomy Answered pt's questions and he would like to proceed w/ plan   2.  Left renal atrophy/ hydronephrosis Severe, chronic  3. CKD stage 3 Most recent Cr 1..5 Establishing care with a new nephrologist from [place] He understands that he would like to have at least some bump in his baseline creatinine following nephrectomy but should be minimal based on the very atrophic nature of his left kidney   [facility] [address] [place], [place] [address] [phone]  I, Lucas Mallow, am acting as a scribe for Dr. Hollice Espy,  I have reviewed the above documentation for accuracy and completeness, and I agree with the above.   Hollice Espy, MD  I spent 40 total minutes on the day of the encounter including pre-visit review of the medical record, face-to-face time with the patient, and post visit ordering of labs/imaging/tests.

## 2019-11-27 NOTE — H&P (View-Only) (Signed)
11/27/19 11:13 AM   [name] [birth year] [id number]  Referring provider: Vidal Schwalbe, MD [address] [place],  [place] [address] Chief Complaint  Patient presents with  . Follow-up    Discuss Surgery/CT scan results    HPI: [name] is a 69 y.o. M with a history of severe chronic left hydronephrosis, left renal atrophy, nephrolithiasis and CKD returns today to discuss surgery/CT scan results.   CT Renal stone study from 11/12/19 revealed interval migration of a 15 mm calculus into the distal left ureter with significant hydronephrosis identified. Perinephric stranding is noted as well suspicious for underlying superimposed infection. Multiple large calculi within the left kidney the largest of which measures 4.3 cm in greatest dimension.  He was admitted to the hospitalist service for  IV antibiotics and supportive care.  He continues to have significant pain.  He ultimately needs nephrostomy tube placement..  All urine cultures include were negative.  Creatinine 1.5 on labs down by PCP a few days ago.    No prior hx of abdominal surgeries.   He returns today to discuss options for management of his left-sided nephrolithiasis.  [name] on multiple stone procedures with recent left stone burden and is severely atrophic left kidney.  Notes for details.  At this point time, he is considering nephrectomy.  [name] scheduled to see cardiology next week for preoperative clearance.  PMH: Past Medical History:  Diagnosis Date  . Cardiomyopathy ([place])   . Chronic combined systolic (congestive) and diastolic (congestive) heart failure (HCC)    a. EF 25% by cath in 2013 b. Echo in 07/2015 showing EF of 15-20%, moderate MR, moderate Pulm HTN, severely dilated IVC  . CKD (chronic kidney disease) stage 3, GFR 30-59 ml/min   . Coronary artery disease   . Diabetes mellitus type 2, uncontrolled ([name])    a. A1c 11.0 in 07/2015.  [name] Hypertension   . Kidney stones    Left  .  New onset atrial fibrillation ([name])    a. diagnosed in 07/2015 b. started on Eliquis  . Paroxysmal atrial fibrillation (HCC)   . Pollen allergies 09-21-15   pt called and stated that he woke up and had some drainage-pt states he did not see the color of the drainage-pt denies running a fever and this only happened once-pt instructed to call Dr Audree Bane office if he starts running a fever or if the color of drainage becomes yellow/green  . Psoriasis   . Pulmonary hypertension ([place])   . Renal disorder    kidney stone  . Sleep apnea     Surgical History: Past Surgical History:  Procedure Laterality Date  . CARDIAC CATHETERIZATION N/A 07/25/2015   Procedure: Right/Left Heart Cath and Coronary Angiography;  Surgeon: Wellington Hampshire, MD;  Location: [facility];  Service: Cardiovascular;  Laterality: N/A;  . CARDIAC [place]     [place]  . CARDIAC CATHETERIZATION     [facility], [place]    . CYSTOSCOPY WITH STENT PLACEMENT Left 09/07/2015   Procedure: CYSTOSCOPY WITH STENT PLACEMENT;  Surgeon: Hollice Espy, MD;  Location: [facility];  Service: Urology;  Laterality: Left;  . CYSTOSCOPY/URETEROSCOPY/HOLMIUM LASER/STENT PLACEMENT Left 10/25/2015   Procedure: CYSTOSCOPY/URETEROSCOPY/HOLMIUM LASER/STENT EXCHANGE;  Surgeon: Hollice Espy, MD;  Location: [facility];  Service: Urology;  Laterality: Left;  . CYSTOSCOPY/URETEROSCOPY/HOLMIUM LASER/STENT PLACEMENT Left 05/11/2019   Procedure: CYSTOSCOPY/URETEROSCOPY/HOLMIUM LASER/STENT PLACEMENT;  Surgeon: Hollice Espy, MD;  Location: [facility]  ORS;  Service: Urology;  Laterality: Left;  . EXTRACORPOREAL SHOCK WAVE LITHOTRIPSY Left 04/02/2019   Procedure: EXTRACORPOREAL SHOCK WAVE LITHOTRIPSY (ESWL);  Surgeon: Hollice Espy, MD;  Location: [facility];  Service: Urology;  Laterality: Left;  . EXTRACORPOREAL SHOCK WAVE LITHOTRIPSY Left 03/05/2019   Procedure: EXTRACORPOREAL SHOCK WAVE LITHOTRIPSY  (ESWL);  Surgeon: Abbie Sons, MD;  Location: [facility];  Service: Urology;  Laterality: Left;  . KIDNEY SURGERY    . NEPHROSTOMY TUBE PLACEMENT ([name]) Left   . URETEROSCOPY WITH HOLMIUM LASER LITHOTRIPSY Left 09/07/2015   Procedure: URETEROSCOPY WITH HOLMIUM LASER LITHOTRIPSY;  Surgeon: Hollice Espy, MD;  Location: [facility];  Service: Urology;  Laterality: Left;    Home Medications:  Allergies as of 11/27/2019      Reactions   Other Hives   Blue cheese      Medication List       Accurate as of November 27, 2019 11:59 PM. If you have any questions, ask your nurse or doctor.        STOP taking these medications   furosemide 80 MG tablet Commonly known as: LASIX Stopped by: Hollice Espy, MD     TAKE these medications   amiodarone 200 MG tablet Commonly known as: PACERONE Take 1 tablet (200 mg total) by mouth daily.   apixaban 5 MG Tabs tablet Commonly known as: Eliquis Take 1 tablet (5 mg total) 2 (two) times daily by mouth.   b complex vitamins tablet Take 1 tablet by mouth every evening.   baclofen 10 MG tablet Commonly known as: LIORESAL Take 10 mg by mouth every evening.   carvedilol 6.25 MG tablet Commonly known as: COREG TAKE 1 TABLET BY MOUTH TWICE DAILY   clonazePAM 1 MG tablet Commonly known as: KLONOPIN Take 1 mg by mouth at bedtime.   CoQ10 400 MG Caps Take 400 mg by mouth 2 (two) times daily.   dextromethorphan-guaiFENesin 30-600 MG 12hr tablet Commonly known as: MUCINEX DM Take 1 tablet by mouth 2 (two) times daily.   docusate sodium 100 MG capsule Commonly known as: COLACE Take 1 capsule (100 mg total) by mouth 2 (two) times daily.   glipiZIDE 10 MG tablet Commonly known as: GLUCOTROL Take 10 mg by mouth 2 (two) times daily.   Hawthorne Berry 550 MG Caps Take 550 mg by mouth every evening.   HYDROcodone-acetaminophen 5-325 MG tablet Commonly known as: NORCO/VICODIN Take 1 tablet by mouth every 6 (six) hours as needed for moderate  pain.   Jardiance 10 MG Tabs tablet Generic drug: empagliflozin Take 10 mg by mouth every evening.   magnesium oxide 400 MG tablet Commonly known as: MAG-OX Take 400 mg by mouth every morning.   montelukast 10 MG tablet Commonly known as: SINGULAIR Take 10 mg by mouth at bedtime.   oxybutynin 5 MG tablet Commonly known as: DITROPAN Take 1 tablet (5 mg total) by mouth every 8 (eight) hours as needed for bladder spasms.   Ozempic (1 MG/DOSE) 2 MG/1.5ML Sopn Generic drug: Semaglutide (1 MG/DOSE) Inject 1 mg into the skin every Monday. In the evening.   polyethylene glycol 17 g packet Commonly known as: MIRALAX / GLYCOLAX Take 17 g by mouth daily as needed for mild constipation.   PRESERVISION AREDS 2 PO Take 1 tablet by mouth 2 (two) times daily.   rosuvastatin 20 MG tablet Commonly known as: CRESTOR Take 20 mg by mouth every evening.   Systane 0.4-0.3 % Soln Generic drug: Polyethyl Glycol-Propyl Glycol Place  1 drop into both eyes 3 (three) times daily as needed (dry/irritated eyes.).   tamsulosin 0.4 MG Caps capsule Commonly known as: FLOMAX Take 1 capsule (0.4 mg total) by mouth daily.   torsemide 20 MG tablet Commonly known as: DEMADEX Take 20 mg by mouth 2 (two) times daily.   Toujeo Max SoloStar 300 UNIT/ML Solostar Pen Generic drug: insulin glargine (2 Unit Dial) Inject 40 Units into the skin every morning. Titrate up to 60 units/day, per endocrinology instructions.   vitamin C 1000 MG tablet Take 1,000 mg by mouth daily.   Vitamin D (Ergocalciferol) 1.25 MG (50000 UNIT) Caps capsule Commonly known as: DRISDOL Take 50,000 Units by mouth every Monday.       Allergies:  Allergies  Allergen Reactions  . Other Hives    Blue cheese    Family History: Family History  Problem Relation Age of Onset  . Heart failure Father   . Heart attack Brother   . Kidney cancer Neg Hx   . Bladder Cancer Neg Hx   . Prostate cancer Neg Hx     Social History:   reports that he has never smoked. He has never used smokeless tobacco. He reports current alcohol use. He reports that he does not use drugs.   Physical Exam: BP 132/80   Pulse 78   Ht 6' (1.829 m)   Wt 270 lb (122.5 kg)   BMI 36.62 kg/m   Constitutional:  Alert and oriented, No acute distress. HEENT: [place] AT, moist mucus membranes.  Trachea midline, no masses. Cardiovascular: No clubbing, cyanosis, or edema. Respiratory: Normal respiratory effort, no increased work of breathing. Abdomen: Obese, no hernias appreciated Skin: No rashes, bruises or suspicious lesions. Neurologic: Grossly intact, no focal deficits, moving all 4 extremities. Psychiatric: Normal mood and affect.  Laboratory Data:  Lab Results  Component Value Date   CREATININE 2.34 (H) 11/18/2019    Lab Results  Component Value Date   HGBA1C 6.3 (H) 11/12/2019   Pertinent Imaging: Results for orders placed in visit on 10/23/19  DG Abd 1 View  Narrative CLINICAL DATA:  Flank pain. Nausea and decreased appetite for 2 days. Concern for kidney stones.  EXAM: ABDOMEN - 1 VIEW  COMPARISON:  05/07/2019 CT.  Plain film 09/19/2019.  FINDINGS: Nonobstructive bowel gas pattern. A large amount of colonic stool, including projecting over the left kidney. The previously described lower pole left renal collecting system calculus may be identified on the second image, 4.2 cm.  IMPRESSION: Large colonic stool volume, suggesting constipation.  The previously described lower pole left renal collecting system dominant stone is partially obscured, favored to be visualized.   Electronically Signed By: Abigail Miyamoto M.D. On: 11/12/2019 09:34  Results for orders placed during the hospital encounter of 11/12/19  CT Renal Stone Study  Narrative CLINICAL DATA:  Flank pain and fevers  EXAM: CT ABDOMEN AND PELVIS WITHOUT CONTRAST  TECHNIQUE: Multidetector CT imaging of the abdomen and pelvis was performed following  the standard protocol without IV contrast.  COMPARISON:  05/07/2019  FINDINGS: Lower chest: No acute abnormality.  Hepatobiliary: No focal liver abnormality is seen. No gallstones, gallbladder wall thickening, or biliary dilatation.  Pancreas: Unremarkable. No pancreatic ductal dilatation or surrounding inflammatory changes.  Spleen: Normal in size without focal abnormality.  Adrenals/Urinary Tract: Adrenal glands are within normal limits. Right kidney is unremarkable without obstructive change. Left kidney demonstrates hydronephrosis increased from the prior exam with evidence of hydroureter. In the distal left ureter above  the ureterovesical junction there is a 15 mm stone identified causing the obstructive change. Multiple calculi are noted within the left kidney the largest of which measures 4.3 cm. Considerable cortical thinning is noted bladder is partially distended.  Stomach/Bowel: Scattered diverticular change of the colon is noted without evidence of diverticulitis. The appendix has been surgically removed. Small bowel is unremarkable. No gastric abnormality is seen.  Vascular/Lymphatic: Aortic atherosclerosis. No enlarged abdominal or pelvic lymph nodes.  Reproductive: Prostate is unremarkable.  Other: No abdominal wall hernia or abnormality. No abdominopelvic ascites.  Musculoskeletal: No acute or significant osseous findings.  IMPRESSION: Interval migration of a 15 mm calculus into the distal left ureter with significant hydronephrosis identified. Perinephric stranding is noted as well suspicious for underlying superimposed infection.  Multiple large calculi within the left kidney the largest of which measures 4.3 cm in greatest dimension.   Electronically Signed By: Inez Catalina M.D. On: 11/12/2019 14:42  I have personally reviewed the images and agree with radiologist interpretation.   Assessment & Plan:    1. Flank pain w/ hx of urolithiasis    Multiple recurrent stone episodes on the left As per previous conversations, this point  Discussed performing simple nephrectomy (robotic assisted) w/ associated risks and benefits including risk of bleeding, infection, pain, and damage to surrounding structures. Discussed ureteral stone, will consider ureteroscopy if issues arise to treat possible retained distal stone if unable to remove at time of nephrectomy Answered pt's questions and he would like to proceed w/ plan   2.  Left renal atrophy/ hydronephrosis Severe, chronic  3. CKD stage 3 Most recent Cr 1..5 Establishing care with a new nephrologist from [facility] He understands that he would like to have at least some bump in his baseline creatinine following nephrectomy but should be minimal based on the very atrophic nature of his left kidney   [place] [address] [place], [place] [address] [phone]  I, Lucas Mallow, am acting as a scribe for Dr. Hollice Espy,  I have reviewed the above documentation for accuracy and completeness, and I agree with the above.   Hollice Espy, MD  I spent 40 total minutes on the day of the encounter including pre-visit review of the medical record, face-to-face time with the patient, and post visit ordering of labs/imaging/tests.

## 2019-11-29 ENCOUNTER — Emergency Department: Payer: Medicare Other

## 2019-11-29 ENCOUNTER — Emergency Department
Admission: EM | Admit: 2019-11-29 | Discharge: 2019-11-29 | Disposition: A | Discharge status: Home / Self Care | Payer: Medicare Other | Attending: Emergency Medicine | Admitting: Emergency Medicine

## 2019-11-29 ENCOUNTER — Other Ambulatory Visit: Payer: Self-pay

## 2019-11-29 DIAGNOSIS — I130 Hypertensive heart and chronic kidney disease with heart failure and stage 1 through stage 4 chronic kidney disease, or unspecified chronic kidney disease: Secondary | ICD-10-CM | POA: Diagnosis not present

## 2019-11-29 DIAGNOSIS — I5042 Chronic combined systolic (congestive) and diastolic (congestive) heart failure: Secondary | ICD-10-CM | POA: Insufficient documentation

## 2019-11-29 DIAGNOSIS — R319 Hematuria, unspecified: Secondary | ICD-10-CM | POA: Diagnosis not present

## 2019-11-29 DIAGNOSIS — N183 Chronic kidney disease, stage 3 unspecified: Secondary | ICD-10-CM | POA: Insufficient documentation

## 2019-11-29 DIAGNOSIS — Z955 Presence of coronary angioplasty implant and graft: Secondary | ICD-10-CM | POA: Insufficient documentation

## 2019-11-29 DIAGNOSIS — R109 Unspecified abdominal pain: Secondary | ICD-10-CM | POA: Diagnosis present

## 2019-11-29 DIAGNOSIS — I2510 Atherosclerotic heart disease of native coronary artery without angina pectoris: Secondary | ICD-10-CM | POA: Insufficient documentation

## 2019-11-29 LAB — BASIC METABOLIC PANEL
Anion gap: 11 (ref 5–15)
BUN: 29 mg/dL — ABNORMAL HIGH (ref 8–23)
CO2: 33 mmol/L — ABNORMAL HIGH (ref 22–32)
Calcium: 8.6 mg/dL — ABNORMAL LOW (ref 8.9–10.3)
Chloride: 100 mmol/L (ref 98–111)
Creatinine, Ser: 1.5 mg/dL — ABNORMAL HIGH (ref 0.61–1.24)
GFR calc Af Amer: 55 mL/min — ABNORMAL LOW (ref >60)
GFR calc non Af Amer: 47 mL/min — ABNORMAL LOW (ref >60)
Glucose, Bld: 187 mg/dL — ABNORMAL HIGH (ref 70–99)
Potassium: 4.2 mmol/L (ref 3.5–5.1)
Sodium: 144 mmol/L (ref 135–145)

## 2019-11-29 LAB — CBC
HCT: 32.8 % — ABNORMAL LOW (ref 39.0–52.0)
Hemoglobin: 10.4 g/dL — ABNORMAL LOW (ref 13.0–17.0)
MCH: 29.3 pg (ref 26.0–34.0)
MCHC: 31.7 g/dL (ref 30.0–36.0)
MCV: 92.4 fL (ref 80.0–100.0)
Platelets: 302 10*3/uL (ref 150–400)
RBC: 3.55 MIL/uL — ABNORMAL LOW (ref 4.22–5.81)
RDW: 17.7 % — ABNORMAL HIGH (ref 11.5–15.5)
WBC: 8.6 10*3/uL (ref 4.0–10.5)
nRBC: 0 % (ref 0.0–0.2)

## 2019-11-29 LAB — URINALYSIS, COMPLETE (UACMP) WITH MICROSCOPIC
Bacteria, UA: NONE SEEN
Bilirubin Urine: NEGATIVE
Glucose, UA: >=500 mg/dL — AB
Ketones, ur: NEGATIVE mg/dL
Nitrite: NEGATIVE
Protein, ur: 100 mg/dL — AB
RBC / HPF: >50 RBC/hpf — ABNORMAL HIGH (ref 0–5)
Specific Gravity, Urine: 1.014 (ref 1.005–1.030)
WBC, UA: >50 WBC/hpf — ABNORMAL HIGH (ref 0–5)
pH: 7 (ref 5.0–8.0)

## 2019-11-29 LAB — LACTIC ACID, PLASMA: Lactic Acid, Venous: 1.3 mmol/L (ref 0.5–1.9)

## 2019-11-29 IMAGING — CT CT RENAL STONE PROTOCOL
2 of 4 series · 17 of 46 positions shown, 19 images · non-contrast
Comparison: [DATE]

CLINICAL DATA: Flank pain, hematuria

EXAM:
CT ABDOMEN AND PELVIS WITHOUT CONTRAST
TECHNIQUE: Multidetector CT imaging of the abdomen and pelvis was performed
following the standard protocol without IV contrast.

[Series 2: stone full standard · axial · 0.98mm/px · z∈[-550,-95]mm · 14 of 99 slices shown, 16 images]
[im 4/99  soft-tissue]
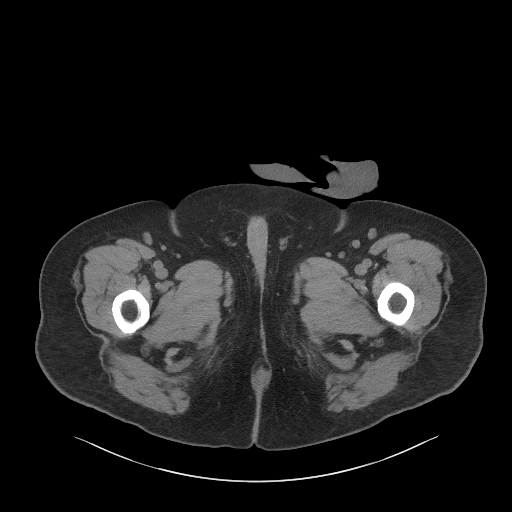
[im 4/99  bone]
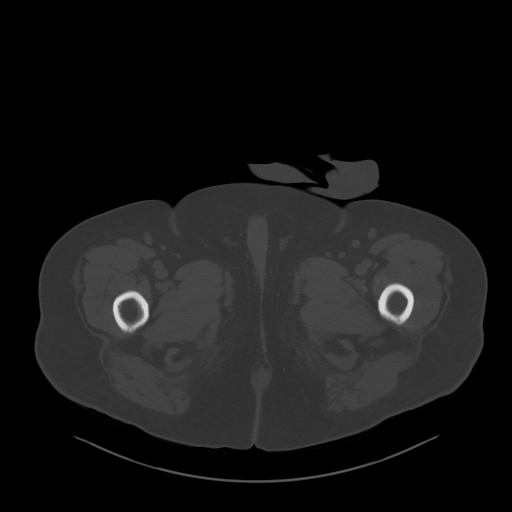
[im 12/99  soft-tissue]
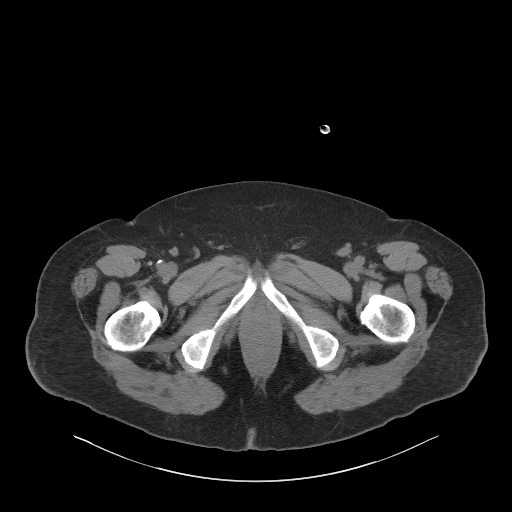
[im 20/99  soft-tissue]
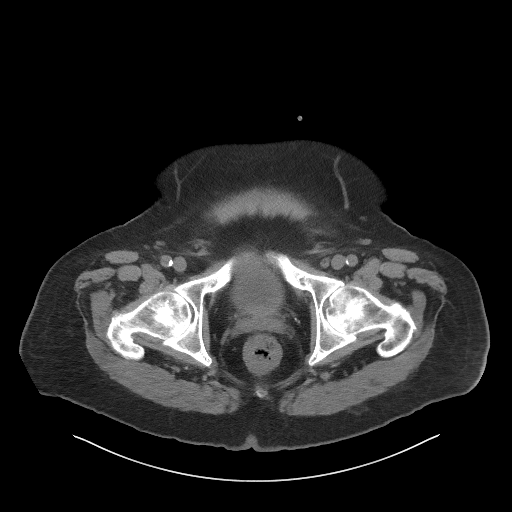
[im 28/99  soft-tissue]
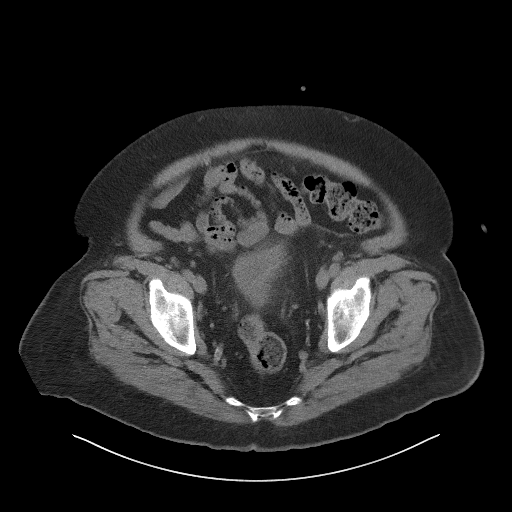
[im 32/99  soft-tissue]
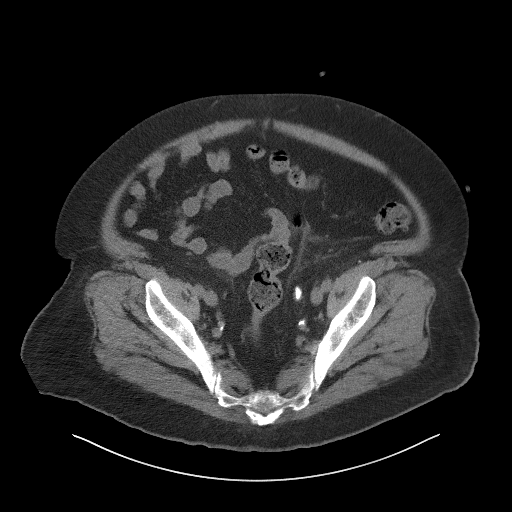
[im 40/99  soft-tissue]
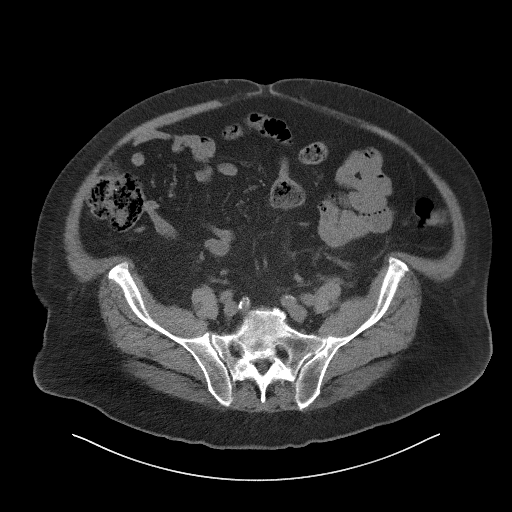
[im 48/99  soft-tissue]
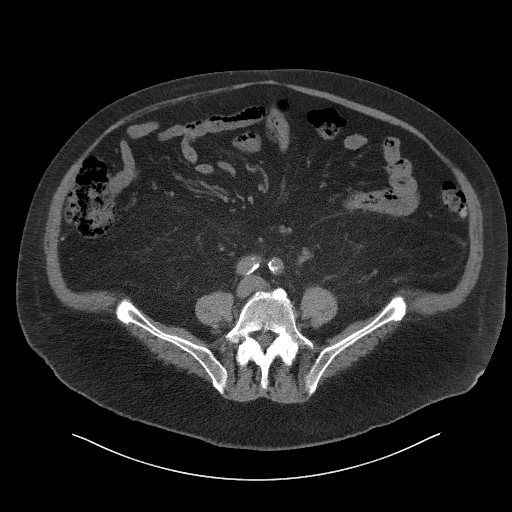
[im 51/99  soft-tissue]
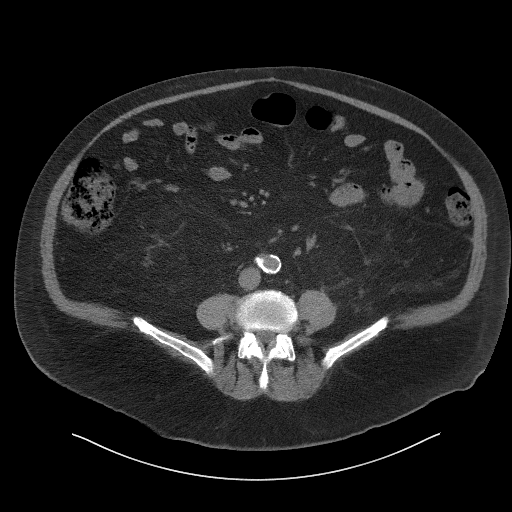
[im 59/99  soft-tissue]
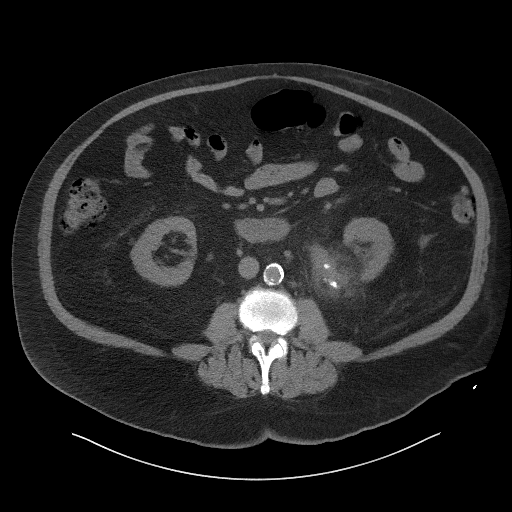
[im 59/99  bone]
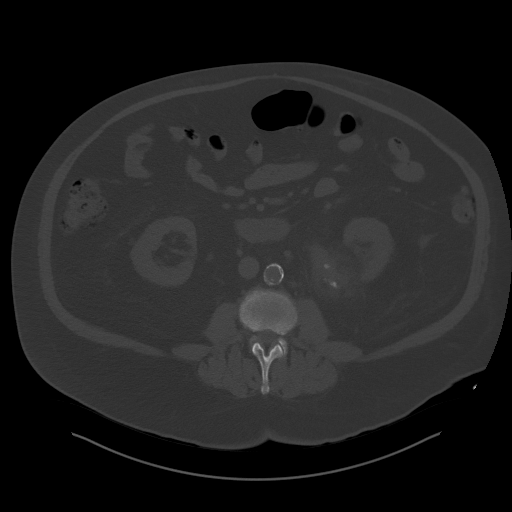
[im 67/99  soft-tissue]
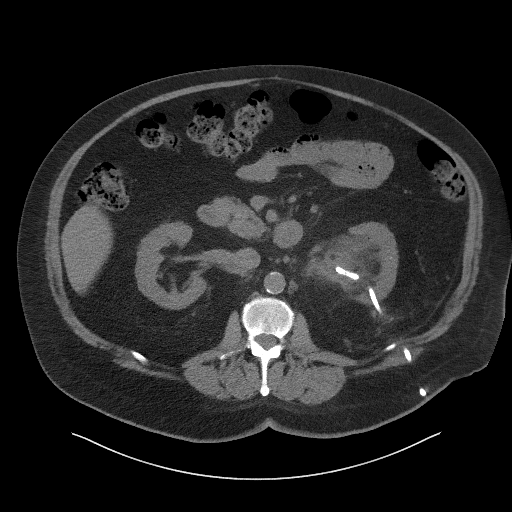
[im 75/99  soft-tissue]
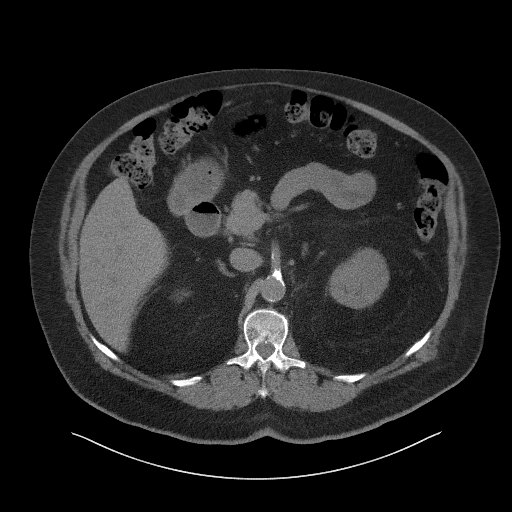
[im 79/99  soft-tissue]
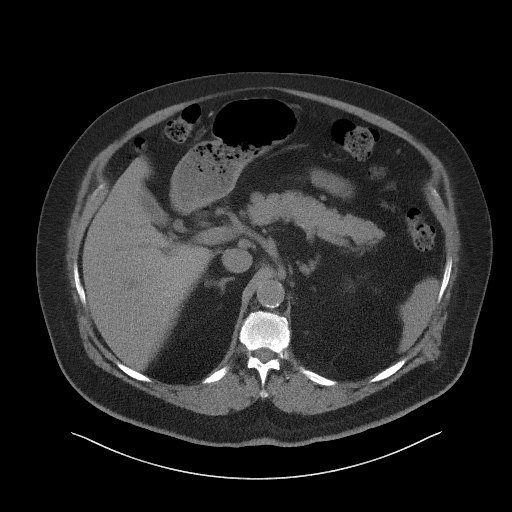
[im 87/99  soft-tissue]
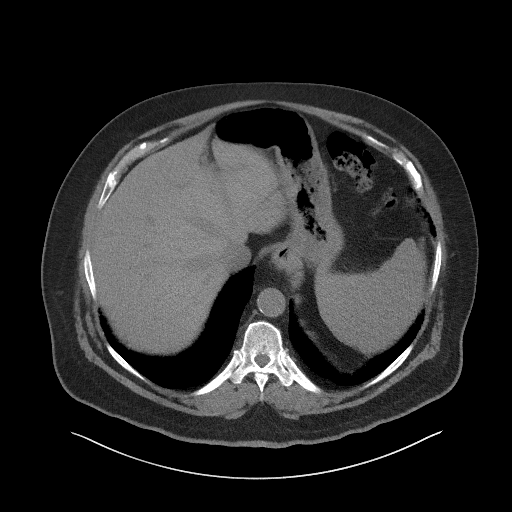
[im 95/99  soft-tissue]
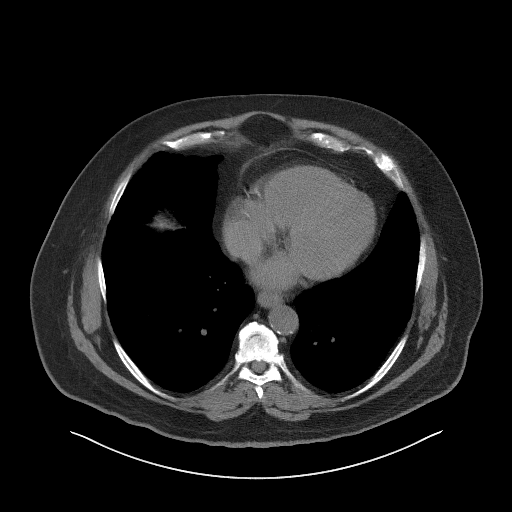

[Series 5: coronal · coronal · 0.96mm/px · 3 of 192 slices shown]
[im 64/192  soft-tissue]
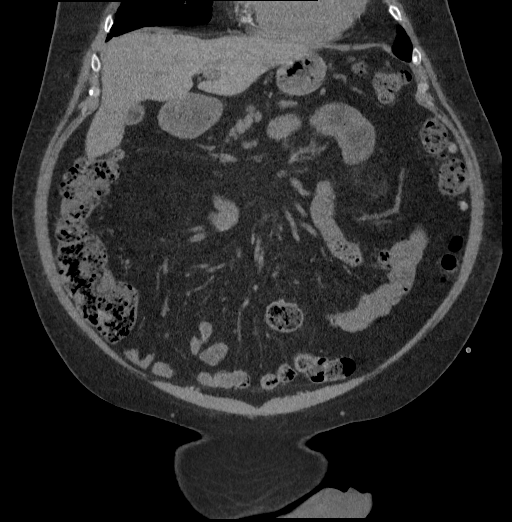
[im 85/192  soft-tissue]
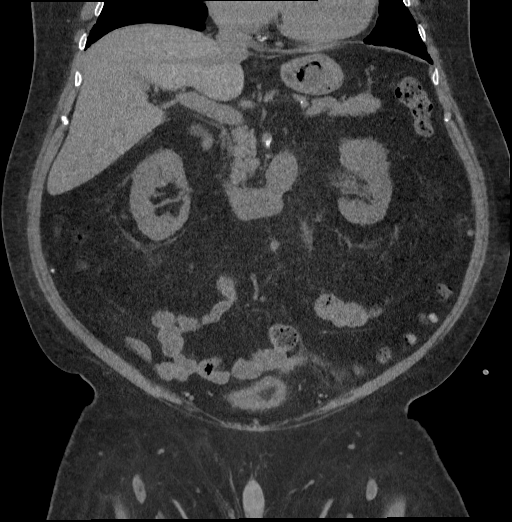
[im 107/192  soft-tissue]
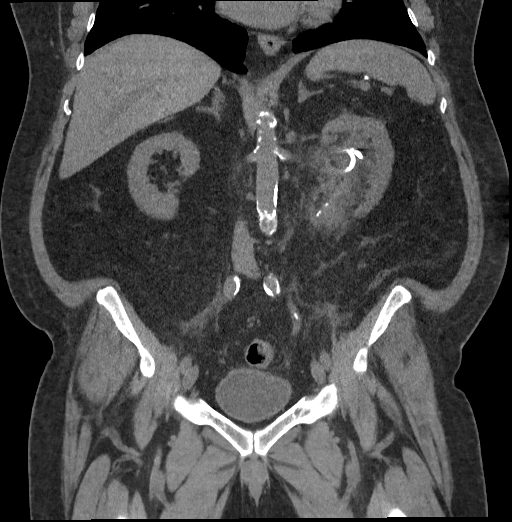

[17 of 46 positions shown; findings below may reference images not displayed]

FINDINGS: Lower chest: Coronary artery calcifications.  No acute abnormality.

Hepatobiliary: No focal hepatic abnormality. Gallbladder
unremarkable.

Pancreas: No focal abnormality or ductal dilatation.

Spleen: No focal abnormality.  Normal size.

Adrenals/Urinary Tract: Left nephrostomy catheter in place, new
since prior study. Multiple small stones/stone fragments noted in
the dilated left renal pelvis and left ureter, the largest in the
distal left ureter measuring 10 mm. Extensive stranding around the
renal pelvis and proximal ureter. No hydronephrosis on the right.
Adrenal glands and urinary bladder unremarkable.

Stomach/Bowel: Normal appendix. Scattered colonic diverticula. No
active diverticulitis. Stomach and small bowel decompressed,
unremarkable.

Vascular/Lymphatic: Aortic atherosclerosis. No enlarged abdominal or
pelvic lymph nodes.

Reproductive: Prominent prostate with central calcifications.

Other: No free fluid or free air.

Musculoskeletal: No acute bony abnormality.
IMPRESSION: Interval placement of left nephrostomy. Hydronephrosis has decreased
since prior study, but there is extensive stranding around the left
renal pelvis and proximal ureter. Numerous small stones and stone
fragments noted in the left renal pelvis and throughout the left
ureter, the largest 10 mm in the distal left ureter.

Scattered colonic diverticulosis.  No active diverticulitis.

Prominent prostate with central calcifications.

Coronary artery disease.

## 2019-11-29 NOTE — ED Triage Notes (Signed)
Pt comes POV with flank pain and hematuria. Pt has left urostomy bag. Started this morning. Pt is urinating normally from ureter. Blood is all from left urostomy bag.

## 2019-11-29 NOTE — ED Provider Notes (Signed)
ER Provider Note       Time seen: 8:24 PM    I have reviewed the vital signs and the nursing notes.  HISTORY   Chief Complaint Flank Pain and Hematuria    HPI [name] is a 69 y.o. male with a history of cardiomyopathy, CHF, CKD, coronary disease, diabetes, hypertension, kidney stones, A. fib, nephrostomy tube who presents today for flank pain and hematuria.  Patient states that hematuria started this morning, does not remember any specific trauma or heavy lifting that could have caused this.  Describes 8 out of 10 left flank pain which has resolved as well as the hematuria.  Past Medical History:  Diagnosis Date  . Cardiomyopathy ([place])   . Chronic combined systolic (congestive) and diastolic (congestive) heart failure (HCC)    a. EF 25% by cath in 2013 b. Echo in 07/2015 showing EF of 15-20%, moderate MR, moderate Pulm HTN, severely dilated IVC  . CKD (chronic kidney disease) stage 3, GFR 30-59 ml/min   . Coronary artery disease   . Diabetes mellitus type 2, uncontrolled ([place])    a. A1c 11.0 in 07/2015.  [name] Hypertension   . Kidney stones    Left  . New onset atrial fibrillation ([place])    a. diagnosed in 07/2015 b. started on Eliquis  . Paroxysmal atrial fibrillation (HCC)   . Pollen allergies 09-21-15   pt called and stated that he woke up and had some drainage-pt states he did not see the color of the drainage-pt denies running a fever and this only happened once-pt instructed to call Dr Audree Bane office if he starts running a fever or if the color of drainage becomes yellow/green  . Psoriasis   . Pulmonary hypertension ([place])   . Renal disorder    kidney stone  . Sleep apnea     Past Surgical History:  Procedure Laterality Date  . CARDIAC CATHETERIZATION N/A 07/25/2015   Procedure: Right/Left Heart Cath and Coronary Angiography;  Surgeon: Wellington Hampshire, MD;  Location: [facility];  Service: Cardiovascular;  Laterality: N/A;  . C[place]IAC [place]IZATION      [place]  . CARDIAC CATHETERIZATION     [facility], [place]    . CYSTOSCOPY WITH STENT PLACEMENT Left 09/07/2015   Procedure: CYSTOSCOPY WITH STENT PLACEMENT;  Surgeon: Hollice Espy, MD;  Location: [facility];  Service: Urology;  Laterality: Left;  . CYSTOSCOPY/URETEROSCOPY/HOLMIUM LASER/STENT PLACEMENT Left 10/25/2015   Procedure: CYSTOSCOPY/URETEROSCOPY/HOLMIUM LASER/STENT EXCHANGE;  Surgeon: Hollice Espy, MD;  Location: [facility];  Service: Urology;  Laterality: Left;  . CYSTOSCOPY/URETEROSCOPY/HOLMIUM LASER/STENT PLACEMENT Left 05/11/2019   Procedure: CYSTOSCOPY/URETEROSCOPY/HOLMIUM LASER/STENT PLACEMENT;  Surgeon: Hollice Espy, MD;  Location: [facility];  Service: Urology;  Laterality: Left;  . EXTRACORPOREAL SHOCK WAVE LITHOTRIPSY Left 04/02/2019   Procedure: EXTRACORPOREAL SHOCK WAVE LITHOTRIPSY (ESWL);  Surgeon: Hollice Espy, MD;  Location: [facility];  Service: Urology;  Laterality: Left;  . EXTRACORPOREAL SHOCK WAVE LITHOTRIPSY Left 03/05/2019   Procedure: EXTRACORPOREAL SHOCK WAVE LITHOTRIPSY (ESWL);  Surgeon: Abbie Sons, MD;  Location: [facility];  Service: Urology;  Laterality: Left;  . KIDNEY SURGERY    . NEPHROSTOMY TUBE PLACEMENT ([place] HX) Left   . URETEROSCOPY WITH HOLMIUM LASER LITHOTRIPSY Left 09/07/2015   Procedure: URETEROSCOPY WITH HOLMIUM LASER LITHOTRIPSY;  Surgeon: Hollice Espy, MD;  Location: [facility];  Service: Urology;  Laterality: Left;    Allergies Other  Review of Systems Constitutional: Negative for fever. Cardiovascular: Negative for  chest pain. Respiratory: Negative for shortness of breath. Gastrointestinal: Positive for flank pain Genitourinary: Positive for hematuria Musculoskeletal: Negative for back pain. Skin: Negative for rash. Neurological: Negative for headaches, focal weakness or numbness.  All systems negative/normal/unremarkable except as stated in the  HPI  ____________________________________________   PHYSICAL EXAM:  VITAL SIGNS: Vitals:   11/29/19 1745 11/29/19 1748  BP: 127/82   Pulse: 78   Resp: 18   Temp:  99.3 F (37.4 C)  SpO2: 97%     Constitutional: Alert and oriented. Well appearing and in no distress. Eyes: Conjunctivae are normal. Normal extraocular movements. Cardiovascular: Normal rate, regular rhythm. No murmurs, rubs, or gallops. Respiratory: Normal respiratory effort without tachypnea nor retractions. Breath sounds are clear and equal bilaterally. No wheezes/rales/rhonchi. Gastrointestinal: Soft and nontender. Normal bowel sounds Genitourinary: Left-sided nephrostomy insertion site with mild erythema and drainage but no overt signs of cellulitis, no significant purulent drainage.  Out of the nephrostomy tube there is significant sediment, there is blood in the urostomy bag but none in the urostomy line leading to the bag. Musculoskeletal: Nontender with normal range of motion in extremities. No lower extremity tenderness nor edema. Neurologic:  Normal speech and language. No gross focal neurologic deficits are appreciated.  Skin:  Skin is warm, dry and intact. No rash noted. Psychiatric: Speech and behavior are normal.  ____________________________________________   LABS (pertinent positives/negatives)  Labs Reviewed  URINALYSIS, COMPLETE (UACMP) WITH MICROSCOPIC - Abnormal; Notable for the following components:      Result Value   Color, Urine AMBER (*)    APPearance TURBID (*)    Glucose, UA >=500 (*)    Hgb urine dipstick MODERATE (*)    Protein, ur 100 (*)    Leukocytes,Ua LARGE (*)    RBC / HPF >50 (*)    WBC, UA >50 (*)    All other components within normal limits  BASIC METABOLIC PANEL - Abnormal; Notable for the following components:   CO2 33 (*)    Glucose, Bld 187 (*)    BUN 29 (*)    Creatinine, Ser 1.50 (*)    Calcium 8.6 (*)    GFR calc non Af Amer 47 (*)    GFR calc Af Amer 55 (*)     All other components within normal limits  CBC - Abnormal; Notable for the following components:   RBC 3.55 (*)    Hemoglobin 10.4 (*)    HCT 32.8 (*)    RDW 17.7 (*)    All other components within normal limits  URINE CULTURE  LACTIC ACID, PLASMA  LACTIC ACID, PLASMA    RADIOLOGY  Images were viewed by me CT renal protocol IMPRESSION: Interval placement of left nephrostomy. Hydronephrosis has decreased since prior study, but there is extensive stranding around the left renal pelvis and proximal ureter. Numerous small stones and stone fragments noted in the left renal pelvis and throughout the left ureter, the largest 10 mm in the distal left ureter.  Scattered colonic diverticulosis.  No active diverticulitis.  Prominent prostate with central calcifications.  Coronary artery disease.  DIFFERENTIAL DIAGNOSIS  Renal colic, UTI, pyelonephritis, nephrostomy tube dysfunction or malpositioning  ASSESSMENT AND PLAN  Hematuria   Plan: The patient had presented for flank pain and hematuria. Patient's labs were at their baseline. CT revealed nephrostomy tube in good position with decreased hydronephrosis. No other acute abnormality. We have sent a urine culture and I discussed with urology. I will advise if the hematuria persists he  may have to hold Eliquis.  Lenise Arena MD    Note: This note was generated in part or whole with voice recognition software. Voice recognition is usually quite accurate but there are transcription errors that can and very often do occur. I apologize for any typographical errors that were not detected and corrected.     Earleen Newport, MD 11/29/19 2205

## 2019-11-29 NOTE — ED Notes (Signed)
Dressing changed at urostomy site; line flushed with NS by patient's caregiver at bedside

## 2019-12-01 LAB — URINE CULTURE

## 2019-12-02 ENCOUNTER — Ambulatory Visit (INDEPENDENT_AMBULATORY_CARE_PROVIDER_SITE_OTHER): Payer: Medicare Other | Admitting: Nurse Practitioner

## 2019-12-02 ENCOUNTER — Ambulatory Visit: Payer: Medicare Other | Admitting: Physician Assistant

## 2019-12-02 ENCOUNTER — Ambulatory Visit (INDEPENDENT_AMBULATORY_CARE_PROVIDER_SITE_OTHER): Payer: Medicare Other | Admitting: Physician Assistant

## 2019-12-02 ENCOUNTER — Other Ambulatory Visit: Admission: RE | Admit: 2019-12-02 | Payer: Medicare Other | Source: Ambulatory Visit

## 2019-12-02 ENCOUNTER — Encounter: Payer: Self-pay | Admitting: Nurse Practitioner

## 2019-12-02 ENCOUNTER — Other Ambulatory Visit: Payer: Self-pay

## 2019-12-02 VITALS — BP 114/60 | HR 80 | Ht 72.0 in | Wt 272.0 lb

## 2019-12-02 DIAGNOSIS — N261 Atrophy of kidney (terminal): Secondary | ICD-10-CM

## 2019-12-02 DIAGNOSIS — E785 Hyperlipidemia, unspecified: Secondary | ICD-10-CM | POA: Diagnosis not present

## 2019-12-02 DIAGNOSIS — I2510 Atherosclerotic heart disease of native coronary artery without angina pectoris: Secondary | ICD-10-CM

## 2019-12-02 DIAGNOSIS — I10 Essential (primary) hypertension: Secondary | ICD-10-CM | POA: Diagnosis not present

## 2019-12-02 DIAGNOSIS — Z01810 Encounter for preprocedural cardiovascular examination: Secondary | ICD-10-CM

## 2019-12-02 DIAGNOSIS — N183 Chronic kidney disease, stage 3 unspecified: Secondary | ICD-10-CM

## 2019-12-02 NOTE — Patient Instructions (Signed)
Medication Instructions:  Your physician recommends that you continue on your current medications as directed. Please refer to the Current Medication list given to you today.  *If you need a refill on your cardiac medications before your next appointment, please call your pharmacy*   Lab Work: None ordered If you have labs (blood work) drawn today and your tests are completely normal, you will receive your results only by:  [place] (if you have MyChart) OR  A paper copy in the mail If you have any lab test that is abnormal or we need to change your treatment, we will call you to review the results.   Testing/Procedures: 1- Echo (needed prior to 7/8, Willet Schleifer working on it) Please return to [facility] on ______________ at _______________ AM/PM for an Echocardiogram. Your physician has requested that you have an echocardiogram. Echocardiography is a painless test that uses sound waves to create images of your heart. It provides your doctor with information about the size and shape of your heart and how well your hearts chambers and valves are working. This procedure takes approximately one hour. There are no restrictions for this procedure. Please note; depending on visual quality an IV may need to be placed.     Follow-Up: At [facility], you and your health needs are our priority.  As part of our continuing mission to provide you with exceptional heart care, we have created designated Provider Care Teams.  These Care Teams include your primary Cardiologist (physician) and Advanced Practice Providers (APPs -  Physician Assistants and Nurse Practitioners) who all work together to provide you with the care you need, when you need it.  We recommend signing up for the patient portal called "MyChart".  Sign up information is provided on this After Visit Summary.  MyChart is used to connect with patients for Virtual Visits (Telemedicine).  Patients are able to view lab/test  results, encounter notes, upcoming appointments, etc.  Non-urgent messages can be sent to your provider as well.   To learn more about what you can do with MyChart, go to [email or web address].    Your next appointment:   4 month(s)  The format for your next appointment:   In Person  Provider:    You may see Nelva Bush, MD or Murray Hodgkins, NP

## 2019-12-02 NOTE — Progress Notes (Signed)
Patient presented to clinic today for preop UA and culture in advance of planned left nephrectomy with Dr. Erlene Quan on 12/10/2019. Left PCN remains in place. He reports being seen in the ED 3 days ago for left flank pain and hematuria. UA drawn from his nephrostomy drainage bag notable for >50WBCs/hpf, >50RBCs/hpf, and WBC clumps. Urine culture grew multiple species. He was not started on any antibiotics and states his symptoms have improved. He reports having passed multiple kidney stones thereafter. Of note, CT stone study in the ED reported interval improvement of left hydronephrosis compared with CT dated 11/12/2019.  I removed his PCN drainage bag today and attached a sterile 10cc syringe. Patient drank approximately 20oz of water in the office and after 30 minutes, I was able to pull <1cc of urine from the left nephrostomy tube. Patient reported needing to urinate.  Ultimately, patient voided a urine sample in clinic today. Will obtain preop UA and culture from this sample.

## 2019-12-02 NOTE — Progress Notes (Addendum)
Office Visit    Patient Name: [name] Date of Encounter: 12/02/2019  Primary Care Provider:  Vidal Schwalbe, MD Primary Cardiologist:  Nelva Bush, MD  Chief Complaint    69 year old male with a history of CAD status post prior LAD stenting, paroxysmal atrial fibrillation on Eliquis, heart failure with improved ejection fraction (55-60% June 2018), ischemic cardiomyopathy, diabetes, hypertension, hyperlipidemia, and stage III chronic kidney disease, who presents for preoperative evaluation pending left nephrectomy.  Past Medical History    Past Medical History:  Diagnosis Date  . CKD (chronic kidney disease) stage 3, GFR 30-59 ml/min   . Coronary artery disease    a. 1998 s/p ACS Multi-link BMS to the prox LAD ([place], [place]); b. 07/2015 Cath: LM nl, LAD patent stent, LCX min irregs, OM1/2 min irregs, OM3 nl, RCA min irregs.  . Diabetes mellitus type 2, uncontrolled ([place])    a. A1c 11.0 in 07/2015.  [name] HFimpEF (heart failure with improved ejection fraction) ([place])    a. EF 25% by cath in 2013; b. Echo in 07/2015 showing EF of 15-20%, moderate MR, moderate Pulm HTN, severely dilated IVC; c. 11/2016 Echo: EF 55-60%, no rwma, Gr1 DD, mildly dil LA, nl RV fxn.  . Hypertension   . Ischemic cardiomyopathy   . Kidney stones    Left  . Paroxysmal atrial fibrillation ([place])    a. Dx 07/2015; b. CHA2DS2VASc = 5-->eliquis. Rhythm control w/ amiodarone.  . Pollen allergies 09-21-15   pt called and stated that he woke up and had some drainage-pt states he did not see the color of the drainage-pt denies running a fever and this only happened once-pt instructed to call Dr Audree Bane office if he starts running a fever or if the color of drainage becomes yellow/green  . Psoriasis   . Pulmonary hypertension ([place])   . Renal disorder    kidney stone  . Sleep apnea    Past Surgical History:  Procedure Laterality Date  . CARDIAC CATHETERIZATION N/A 07/25/2015   Procedure: Right/Left  Heart Cath and Coronary Angiography;  Surgeon: Wellington Hampshire, MD;  Location: [place] [facility];  Service: Cardiovascular;  Laterality: N/A;  . CARDIAC CATHE[place]     [place]  . CARDIAC CATHETERIZATION     [facility], [place]    . CYSTOSCOPY WITH STENT PLACEMENT Left 09/07/2015   Procedure: CYSTOSCOPY WITH STENT PLACEMENT;  Surgeon: Hollice Espy, MD;  Location: [facility];  Service: Urology;  Laterality: Left;  . CYSTOSCOPY/URETEROSCOPY/HOLMIUM LASER/STENT PLACEMENT Left 10/25/2015   Procedure: CYSTOSCOPY/URETEROSCOPY/HOLMIUM LASER/STENT EXCHANGE;  Surgeon: Hollice Espy, MD;  Location: [facility];  Service: Urology;  Laterality: Left;  . CYSTOSCOPY/URETEROSCOPY/HOLMIUM LASER/STENT PLACEMENT Left 05/11/2019   Procedure: CYSTOSCOPY/URETEROSCOPY/HOLMIUM LASER/STENT PLACEMENT;  Surgeon: Hollice Espy, MD;  Location: [facility];  Service: Urology;  Laterality: Left;  . EXTRACORPOREAL SHOCK WAVE LITHOTRIPSY Left 04/02/2019   Procedure: EXTRACORPOREAL SHOCK WAVE LITHOTRIPSY (ESWL);  Surgeon: Hollice Espy, MD;  Location: [facility];  Service: Urology;  Laterality: Left;  . EXTRACORPOREAL SHOCK WAVE LITHOTRIPSY Left 03/05/2019   Procedure: EXTRACORPOREAL SHOCK WAVE LITHOTRIPSY (ESWL);  Surgeon: Abbie Sons, MD;  Location: [facility];  Service: Urology;  Laterality: Left;  . KIDNEY SURGERY    . NEPHROSTOMY TUBE PLACEMENT ([place] [facility]) Left   . URETEROSCOPY WITH HOLMIUM LASER LITHOTRIPSY Left 09/07/2015   Procedure: URETEROSCOPY WITH HOLMIUM LASER LITHOTRIPSY;  Surgeon: Hollice Espy, MD;  Location: [facility];  Service: Urology;  Laterality: Left;  Allergies  Allergies  Allergen Reactions  . Other Hives    Blue cheese    History of Present Illness    69 year old male with above complex past medical history including coronary artery disease status post BMS to the proximal LAD in 1998, paroxysmal atrial fibrillation on chronic Eliquis, heart  failure with improved EF, ischemic cardiomyopathy, stage III chronic kidney disease, hypertension, hyperlipidemia, and diabetes.  Atrial fibrillation was diagnosed in February 2017 and he has been managed with amiodarone and Eliquis.  Cardiomyopathy history dates back to at least 2013, at which time EF was 25%.  Most recent echocardiogram in June 2018 showed improvement of LV function, now 55-60% and he has been managed with beta-blocker therapy only in the setting of chronic kidney disease.  His last diagnostic catheterization was performed in February 2017 showing patent LAD stent and otherwise nonobstructive disease.   He was last seen via telemedicine visit in January, at which time he was doing well.  Unfortunately, he was recently admitted on June 10 with acute pyelonephritis.  He was treated with intravenous Ancef.  Percutaneous nephrostomy tube was placed and was notable for purulent appearing urine aspiration.  Cultures showed no growth.  He had flank pain and dysuria and was noted to have an obstructing calculus with severe left-sided hydronephrosis.[name]  He was subsequently discharged but returned to the emergency department on June 27 due to hematuria and flank pain.  CT revealed nephrostomy tube in good position with decreased hydronephrosis.  Urine culture was sent off and returned with multiple species suggestive of contamination.  He has since followed up with urology with a plan for left nephrectomy tentatively scheduled for July 8.  He presents today for preoperative evaluation.  He notes that he is very sedentary and does very little at home.  He does have dyspnea with minimal exertion but does not experience chest pain.  He denies palpitations, PND, orthopnea, dizziness, syncope, or early satiety.  He notes that following his hospitalization, he did have lower extremity edema as his Lasix was held in the setting of acute on chronic kidney disease.  He has since been taking torsemide 40 mg daily,  which he initially got from his roommate but has since had his primary care fill for him.    Home Medications    Prior to Admission medications   Medication Sig Start Date End Date Taking? Authorizing Provider  amiodarone (PACERONE) 200 MG tablet Take 1 tablet (200 mg total) by mouth daily. 08/01/15   Hower, Aaron Mose, MD  apixaban (ELIQUIS) 5 MG TABS tablet Take 1 tablet (5 mg total) 2 (two) times daily by mouth. 04/17/17   End, Harrell Gave, MD  Ascorbic Acid (VITAMIN C) 1000 MG tablet Take 1,000 mg by mouth daily.    [provider]  b complex vitamins tablet Take 1 tablet by mouth every evening.     [provider]  baclofen (LIORESAL) 10 MG tablet Take 10 mg by mouth every evening.     [provider]  carvedilol (COREG) 6.25 MG tablet TAKE 1 TABLET BY MOUTH TWICE DAILY 10/26/16   End, Harrell Gave, MD  clonazePAM (KLONOPIN) 1 MG tablet Take 1 mg by mouth at bedtime.  01/23/17   [provider]  Coenzyme Q10 (COQ10) 400 MG CAPS Take 400 mg by mouth 2 (two) times daily.    [provider]  dextromethorphan-guaiFENesin (MUCINEX DM) 30-600 MG 12hr tablet Take 1 tablet by mouth 2 (two) times daily.    [provider]  docusate sodium (COLACE) 100 MG capsule Take 1 capsule (100 mg total) by mouth 2 (two) times daily. 03/05/19   Hollice Espy, MD  glipiZIDE (GLUCOTROL) 10 MG tablet Take 10 mg by mouth 2 (two) times daily.     [provider]  Cathrine Muster 550 MG CAPS Take 550 mg by mouth every evening.     [provider]  HYDROcodone-acetaminophen (NORCO/VICODIN) 5-325 MG tablet Take 1 tablet by mouth every 6 (six) hours as needed for moderate pain. 11/11/19   Zara Council A, PA-C  Insulin Glargine, 2 Unit Dial, (TOUJEO MAX SOLOSTAR) 300 UNIT/ML SOPN Inject 40 Units into the skin every morning. Titrate up to 60 units/day, per endocrinology instructions. 05/05/18   [provider]  JARDIANCE 10 MG TABS tablet Take 10 mg  by mouth every evening.  04/28/18   [provider]  magnesium oxide (MAG-OX) 400 MG tablet Take 400 mg by mouth every morning.     [provider]  montelukast (SINGULAIR) 10 MG tablet Take 10 mg by mouth at bedtime.  01/27/18   [provider]  Multiple Vitamins-Minerals (PRESERVISION AREDS 2 PO) Take 1 tablet by mouth 2 (two) times daily.    [provider]  oxybutynin (DITROPAN) 5 MG tablet Take 1 tablet (5 mg total) by mouth every 8 (eight) hours as needed for bladder spasms. 10/23/19   Vaillancourt, Aldona Bar, PA-C  OZEMPIC, 1 MG/DOSE, 2 MG/1.5ML SOPN Inject 1 mg into the skin every Monday. In the evening. 01/27/19   [provider]  Polyethyl Glycol-Propyl Glycol (SYSTANE) 0.4-0.3 % SOLN Place 1 drop into both eyes 3 (three) times daily as needed (dry/irritated eyes.).    [provider]  polyethylene glycol (MIRALAX / GLYCOLAX) packet Take 17 g by mouth daily as needed for mild constipation.    [provider]  rosuvastatin (CRESTOR) 20 MG tablet Take 20 mg by mouth every evening.  01/27/19   [provider]  tamsulosin (FLOMAX) 0.4 MG CAPS capsule Take 1 capsule (0.4 mg total) by mouth daily. 10/19/19   Vaillancourt, Aldona Bar, PA-C  torsemide (DEMADEX) 20 MG tablet Take 20 mg by mouth 2 (two) times daily.    [provider]  Vitamin D, Ergocalciferol, (DRISDOL) 1.25 MG (50000 UT) CAPS capsule Take 50,000 Units by mouth every Monday.  02/26/18   [provider]    Review of Systems    Worsening dyspnea on exertion over the past year in the setting of inactivity.  He denies chest pain, palpitations, PND, orthopnea, dizziness, syncope, or early satiety.  He did have lower extremity swelling following recent hospitalization earlier this month but this has since resolved on torsemide therapy.  All other systems reviewed and are otherwise negative except as noted above.  Physical Exam    VS:  BP 114/60 (BP  Location: Left Arm, Patient Position: Sitting, Cuff Size: Normal)   Pulse 80   Ht 6' (1.829 m)   Wt 272 lb (123.4 kg)   SpO2 97%   BMI 36.89 kg/m  , BMI Body mass index is 36.89 kg/m. GEN: Obese, in no acute distress. HEENT: normal. Neck: Supple, obese, difficult to gauge JVP.  No carotid bruits, or masses. Cardiac: RRR, no murmurs, rubs, or gallops. No clubbing, cyanosis, edema.  Radials/PT 2+ and equal bilaterally.  Respiratory:  Respirations regular and unlabored, clear to auscultation bilaterally. GI: Soft, nontender, nondistended, BS + x 4.  Left nephrostomy tube noted. MS: no deformity or atrophy. Skin:  warm and dry, no rash. Neuro:  Strength and sensation are intact. Psych: Normal affect.  Accessory Clinical Findings    ECG personally reviewed by me today -regular sinus rhythm, 80, first-degree AV block, left axis deviation, IVCD- no acute changes.  Lab Results  Component Value Date   WBC 8.6 11/29/2019   HGB 10.4 (L) 11/29/2019   HCT 32.8 (L) 11/29/2019   MCV 92.4 11/29/2019   PLT 302 11/29/2019   Lab Results  Component Value Date   CREATININE 1.50 (H) 11/29/2019   BUN 29 (H) 11/29/2019   NA 144 11/29/2019   K 4.2 11/29/2019   CL 100 11/29/2019   CO2 33 (H) 11/29/2019   Lab Results  Component Value Date   ALT 26 04/10/2019   AST 21 04/10/2019   ALKPHOS 59 04/10/2019   BILITOT 1.0 04/10/2019   Lab Results  Component Value Date   CHOL 153 07/09/2015   HDL 34 (L) 07/09/2015   LDLCALC 85 07/09/2015   TRIG 168 (H) 07/09/2015   CHOLHDL 4.5 07/09/2015    Lab Results  Component Value Date   HGBA1C 6.3 (H) 11/12/2019    Assessment & Plan    1.  Preoperative cardiovascular examination/coronary artery disease: Patient with a history of CAD status post remote bare-metal stenting in 1998 in [place].  Most recent diagnostic catheterization 2017 showed patent stent with minor irregularities throughout.  He has not been having any chest pain but  is very sedentary.  He does experience dyspnea with minimal activity.  [name] now pending left nephrectomy, tentatively scheduled for July 8.  Given his history of heart failure with improved EF and dyspnea, I will arrange for a follow-up echocardiogram to reevaluate LV function as soon as possible.  If EF is down, we will need to pursue ischemic evaluation prior to surgery.  Provided that EF remains normal, he should be able to proceed to the OR without further testing.  Though will await his echo prior to final disposition, he will be able to hold Eliquis beginning 3 days prior to surgery.  He will need to continue statin, beta-blocker, and amiodarone therapy throughout the perioperative period.  2.  Paroxysmal atrial fibrillation: Maintaining sinus rhythm.  He remains on amiodarone and Eliquis.  As noted above, pending echo, he will be able to hold Eliquis 3 days prior to his pending left nephrectomy.  3.  Essential hypertension: Stable on beta-blocker therapy.  4.  Heart failure with improved ejection fraction/ischemic cardiomyopathy: Most recent echo in June 2018 showed an EF of 55 to 60%, which was an improvement from 15 to 20% in 2017.  [name] euvolemic on examination today.  As above, as he has been experiencing significant dyspnea on exertion in the setting of severe deconditioning, I will arrange for repeat echocardiogram.  He remains on beta-blocker and torsemide therapy.  He was previously on losartan but this is on hold in the setting of recent admission with acute on chronic renal failure with pyelonephritis and nephrolithiasis.  5.  Type 2 diabetes mellitus: A1c 6.3.  Remains on Ozempic, toujeo, glipizide, and Jardiance and is managed by primary care.  6.  Stage III chronic kidney disease: Recent acute on chronic injury in the setting of urolithiasis and pyelonephritis.  [name] establishing care with a nephrologist at [facility].  Most recent creatinine was 1.5.  Losartan remains on hold since  hospitalization.  7.  Urolithiasis/left hydronephrosis: Pending nephrectomy.  See #1.  8.  Disposition: Follow-up  echocardiogram within the next few days.  Follow-up in clinic in 3 months or sooner if necessary.  Murray Hodgkins, NP 12/02/2019, 1:20 PM  _____________   Jones Skene 12/04/2019   Echo obtained 7.1.2021.    1. Left ventricular ejection fraction, by estimation, is 40 to 45%. The  left ventricle has mild to moderately decreased function. The left  ventricle demonstrates global hypokinesis. Left ventricular diastolic  parameters are indeterminate.  2. Right ventricular systolic function is mildly reduced. The right  ventricular size is moderately enlarged.  3. Left atrial size was moderately dilated.  4. Right atrial size was mildly dilated.  5. The mitral valve is degenerative. Mild mitral valve regurgitation.  6. The aortic valve is tricuspid. Aortic valve regurgitation is not  visualized.  7. The inferior vena cava is normal in size with greater than 50%  respiratory variability, suggesting right atrial pressure of 3 mmHg.   EF is down slightly from most recent recording (55-60% in 2018) but still well above more remote recordings. Global HK noted. [name] at moderate risk for a cardiac complication (60%) related to pending L nephrectomy. In the absence of chest pain, additional ischemic testing at this time will not likely change his risk and may only serve to delay this necessary surgery. He may proceed with surgery and hold eliquis for three days prior to surgery. He should continue his carvedilol and rosuvastatin therapy throughout the perioperative period with careful attention to IVF and volume status postoperatively.  Murray Hodgkins, NP 12/04/2019, 7:57 AM

## 2019-12-03 ENCOUNTER — Ambulatory Visit (INDEPENDENT_AMBULATORY_CARE_PROVIDER_SITE_OTHER): Payer: Medicare Other

## 2019-12-03 DIAGNOSIS — Z01810 Encounter for preprocedural cardiovascular examination: Secondary | ICD-10-CM

## 2019-12-03 LAB — URINALYSIS, COMPLETE
Bilirubin, UA: NEGATIVE
Ketones, UA: NEGATIVE
Nitrite, UA: NEGATIVE
Specific Gravity, UA: 1.01 (ref 1.005–1.030)
Urobilinogen, Ur: 0.2 mg/dL (ref 0.2–1.0)
pH, UA: 6.5 (ref 5.0–7.5)

## 2019-12-03 LAB — MICROSCOPIC EXAMINATION
Bacteria, UA: NONE SEEN
RBC, Urine: >30 /hpf — AB (ref 0–2)
WBC, UA: >30 /hpf — AB (ref 0–5)

## 2019-12-03 MED ORDER — PERFLUTREN LIPID MICROSPHERE
1.0000 mL | INTRAVENOUS | Status: AC | PRN
  Administered 2019-12-03: 3 mL via INTRAVENOUS

## 2019-12-04 ENCOUNTER — Telehealth: Payer: Self-pay

## 2019-12-04 ENCOUNTER — Encounter
Admission: RE | Admit: 2019-12-04 | Discharge: 2019-12-04 | Disposition: A | Discharge status: Home / Self Care | Payer: Medicare Other | Source: Ambulatory Visit | Attending: Urology | Admitting: Urology

## 2019-12-04 ENCOUNTER — Other Ambulatory Visit: Payer: Self-pay

## 2019-12-04 HISTORY — DX: Personal history of urinary calculi: Z87.442

## 2019-12-04 NOTE — Telephone Encounter (Signed)
Call to patient to review echo results.    Pt verbalized understanding and all questions were answered.    Advised pt to call for any further questions or concerns.  No further orders.

## 2019-12-04 NOTE — Telephone Encounter (Signed)
-----   Message from Theora Gianotti, NP sent at 12/04/2019  7:54 AM EDT ----- Heart squeezing function is down slightly from most recent recording (55-60% in 2018) but still well above more remote recordings.  Only mildly leaky mitral valve.  [name] at moderate risk for a cardiac complication (53%) related to pending L nephrectomy.  In the absence of chest pain, additional ischemic testing at this time will not likely change his risk and may only serve to delay this necessary surgery.  He may proceed with surgery and hold eliquis for three days prior to surgery.  He should continue his carvedilol and rosuvastatin therapy throughout the perioperative period.

## 2019-12-04 NOTE — Patient Instructions (Signed)
COVID TESTING Date: December 09, 2019 Testing site:  [facility] [address] Thru Hours:  8:54 am - 1:00 pm Once you are tested, you are asked to stay quarantined (avoiding public places) until after your surgery.   Your procedure is scheduled on: December 10, 2019 THURSDAY Report to Day Surgery on the 2nd floor of the [facility]. To find out your arrival time, please call [phone] between 1PM - 3PM on: Wednesday December 09, 2019  REMEMBER: Instructions that are not followed completely may result in serious medical risk, up to and including death; or upon the discretion of your surgeon and anesthesiologist your surgery may need to be rescheduled.  Do not eat food after midnight the night before surgery.  No gum chewing, lozengers or hard candies.  You may however, drink CLEAR liquids up to 2 hours before you are scheduled to arrive for your surgery. Do not drink anything within 2 hours of your scheduled arrival time.  Clear liquids include: - water  - apple juice without pulp - gatorade (not RED) - black coffee or tea (Do NOT add milk or creamers to the coffee or tea) Do NOT drink anything that is not on this list.  Type 1 and Type 2 diabetics should only drink water.    TAKE THESE MEDICATIONS THE MORNING OF SURGERY WITH A SIP OF WATER: CARVEDILOL AMIODARONE   NO INSULIN MORNING OF SURGERY.  Follow recommendations from Cardiologist, Pulmonologist or PCP regarding stopping Aspirin, Coumadin, Plavix, Eliquis, Pradaxa, or Pletal. HOLD ELIQUIS 3 DAYS BEFORE SURGERY.  Stop Anti-inflammatories (NSAIDS) such as Advil, Aleve, Ibuprofen, Motrin, Naproxen, Naprosyn and Aspirin based products such as Excedrin, Goodys Powder, BC Powder. (May take Tylenol or Acetaminophen if needed.)  Stop ANY OVER THE COUNTER supplements until after surgery. (May continue Vitamin D, Vitamin B, and multivitamin.)  No Alcohol for 24 hours before or after surgery.  No Smoking  including e-cigarettes for 24 hours prior to surgery.  No chewable tobacco products for at least 6 hours prior to surgery.  No nicotine patches on the day of surgery.  Do not use any "recreational" drugs for at least a week prior to your surgery.  Please be advised that the combination of cocaine and anesthesia may have negative outcomes, up to and including death. If you test positive for cocaine, your surgery will be cancelled.  On the morning of surgery brush your teeth with toothpaste and water, you may rinse your mouth with mouthwash if you wish. Do not swallow any toothpaste or mouthwash.  Do not wear jewelry, make-up, hairpins, clips or nail polish.  Do not wear lotions, powders, or perfumes.   Do not shave 48 hours prior to surgery.   Contact lenses, hearing aids and dentures may not be worn into surgery.  Do not bring valuables to the hospital. [facility] is not responsible for any missing/lost belongings or valuables.   Use CHG  wipes as directed on instruction sheet.  Bring your C-PAP to the hospital with you in case you may have to spend the night.   Notify your doctor if there is any change in your medical condition (cold, fever, infection).  Wear comfortable clothing (specific to your surgery type) to the hospital.  Plan for stool softeners for home use; pain medications have a tendency to cause constipation. You can also help prevent constipation by eating foods high in fiber such as fruits and vegetables and drinking plenty of fluids as your diet  allows.  After surgery, you can help prevent lung complications by doing breathing exercises.  Take deep breaths and cough every 1-2 hours. Your doctor may order a device called an Incentive Spirometer to help you take deep breaths. When coughing or sneezing, hold a pillow firmly against your incision with both hands. This is called "splinting." Doing this helps protect your incision. It also decreases belly  discomfort.  If you are being admitted to the hospital overnight, YOU CAN BRING A SMALL BAG WITH YOU. After surgery it may be brought to your room.  If you are being discharged the day of surgery, you will not be allowed to drive home. You will need a responsible adult (18 years or older) to drive you home and stay with you that night.   If you are taking public transportation, you will need to have a responsible adult (18 years or older) with you. Please confirm with your physician that it is acceptable to use public transportation.   Please call the [facility] at [phone] if you have any questions about these instructions.  Visitation Policy:  Patients undergoing a surgery or procedure may have one family member or support person with them as long as that person is not COVID-19 positive or experiencing its symptoms.  That person may remain in the waiting area during the procedure.  Children under 52 years of age may have both parents or legal guardians with them during their procedure.  Inpatient Visitation Update:   Two designated support people may visit a patient during visiting hours 7 am to 8 pm. It must be the same two designated people for the duration of the patient stay. The visitors may come and go during the day, and there is no switching out to have different visitors. A mask must be worn at all times, including in the patient room.  Children under 56 years of age:  a total of 4 designated visitors for the child's entire stay are allowed. Only 2 in the room at a time and only one staying overnight at a time. The overnight guest can now rotate during the child's hospital stay.  As a reminder, masks are still required for all [place] team members, patients and visitors in all [place] facilities.   Systemwide, no visitors 17 or younger.

## 2019-12-05 LAB — CULTURE, URINE COMPREHENSIVE

## 2019-12-08 ENCOUNTER — Other Ambulatory Visit
Admission: RE | Admit: 2019-12-08 | Discharge: 2019-12-08 | Disposition: A | Discharge status: Home / Self Care | Payer: Medicare Other | Source: Ambulatory Visit | Attending: Urology | Admitting: Urology

## 2019-12-08 ENCOUNTER — Other Ambulatory Visit: Payer: Self-pay

## 2019-12-08 DIAGNOSIS — Z01812 Encounter for preprocedural laboratory examination: Secondary | ICD-10-CM | POA: Insufficient documentation

## 2019-12-08 DIAGNOSIS — Z20822 Contact with and (suspected) exposure to covid-19: Secondary | ICD-10-CM | POA: Insufficient documentation

## 2019-12-08 LAB — TYPE AND SCREEN
ABO/RH(D): O POS
Antibody Screen: POSITIVE

## 2019-12-08 LAB — CBC
HCT: 32.5 % — ABNORMAL LOW (ref 39.0–52.0)
Hemoglobin: 10.5 g/dL — ABNORMAL LOW (ref 13.0–17.0)
MCH: 29.7 pg (ref 26.0–34.0)
MCHC: 32.3 g/dL (ref 30.0–36.0)
MCV: 91.8 fL (ref 80.0–100.0)
Platelets: 220 10*3/uL (ref 150–400)
RBC: 3.54 MIL/uL — ABNORMAL LOW (ref 4.22–5.81)
RDW: 19.2 % — ABNORMAL HIGH (ref 11.5–15.5)
WBC: 7.1 10*3/uL (ref 4.0–10.5)
nRBC: 0 % (ref 0.0–0.2)

## 2019-12-08 LAB — PROTIME-INR
INR: 1.1 (ref 0.8–1.2)
Prothrombin Time: 14 seconds (ref 11.4–15.2)

## 2019-12-08 LAB — BASIC METABOLIC PANEL
Anion gap: 11 (ref 5–15)
BUN: 21 mg/dL (ref 8–23)
CO2: 29 mmol/L (ref 22–32)
Calcium: 8.6 mg/dL — ABNORMAL LOW (ref 8.9–10.3)
Chloride: 100 mmol/L (ref 98–111)
Creatinine, Ser: 1.41 mg/dL — ABNORMAL HIGH (ref 0.61–1.24)
GFR calc Af Amer: 59 mL/min — ABNORMAL LOW (ref >60)
GFR calc non Af Amer: 51 mL/min — ABNORMAL LOW (ref >60)
Glucose, Bld: 86 mg/dL (ref 70–99)
Potassium: 3.5 mmol/L (ref 3.5–5.1)
Sodium: 140 mmol/L (ref 135–145)

## 2019-12-08 LAB — SARS CORONAVIRUS 2 (TAT 6-24 HRS): SARS Coronavirus 2: NEGATIVE

## 2019-12-08 NOTE — Progress Notes (Signed)
[facility] Perioperative Services  Pre-Admission/Anesthesia Testing Clinical Review  Date: 12/08/19  Patient Demographics:  Name: [name] DOB:   Jun 08, 1950 MRN:   [id number]  Planned Surgical Procedure(s):    Case: [id number] Date/Time: 12/10/19 0730   Procedure: XI ROBOTIC ASSISTED LAPAROSCOPIC NEPHRECTOMY (Left )   Anesthesia type: General   Pre-op diagnosis: left atrophic kidney   Location: [facility] OR ROOM 06 / [place] [facility] FOR ANESTHESIA GROUP   Surgeons: Hollice Espy, MD     NOTE: Available PAT nursing documentation and vital signs have been reviewed. Clinical nursing staff has updated patient's PMH/PSHx, current medication list, and drug allergies/intolerances to ensure comprehensive history available to assist in medical decision making as it pertains to the aforementioned surgical procedure and anticipated anesthetic course.   Clinical Discussion:  [name] is a 69 y.o. male who is submitted for pre-surgical anesthesia review and clearance prior to him undergoing the above procedure. Patient has never been a smoker. Pertinent PMH includes: CAD, CHF, non-ischemia cardiomyopathy, HLD, OSA, HTN, PAF, pulmonary HTN, CKD stage III, T2DM, nephrostomy tube placement.   Patient is followed by cardiology (End, MD). Patient was last seen in the cardiology clinic on 12/02/2019; notes reviewed. At that time, patient reported a sedentary lifestyle secondary to his worsening dyspnea. He denies chest pain, palpitations, orthopnea, syncope, and dizziness. Patient was s/p 2 hospitalizations in the month of June 2021 due to issues with his kidneys (pyelonephritis) with placement of a LEFT nephrostomy tube. Patient with PMH (+) for heart failure, however his EF had improved. Cardiology wanting to repeat echocardiogram prior to clearing for surgery. Echocardiogram performed on 12/03/2019 revealed a reduced EF of 40-45% (previously 55-60% in 2018); see below for full  results. Cardiology issued clearance for surgery with a MODERATE risk stratification (11% risk of cardiac complication) noting that "in the absence of chest pain, additional ischemic testing at this time will not likely change his risk and may only serve to delay this necessary surgery. Patient should continue carvedilol and rosuvastatin therapy throughout the perioperative period with careful attention to IVF and volume status postoperatively".  He denies previous intra-operative complications with anesthesia. Patient had a procedure here under general anesthesia (ASA IV) on 05/11/2019 with no documented complications. This patient is on daily anticoagulation therapy. He has been instructed on recommendations for holding his apixaban for 3 days prior to his procedure. The patient has been instructed that his last dose of his anticoagulant will be on 12/07/2019.  Vitals with BMI 12/04/2019 12/02/2019 11/29/2019  Height 6\' 0"  6\' 0"  -  Weight 272 lbs 272 lbs -  BMI 40.34 74.25 -  Systolic - 956 387  Diastolic - 60 65  Pulse - 80 72    Providers/Specialists:   PROVIDER ROLE LAST Lu Duffel, MD Urology (Surgeon) 12/02/2019  Vidal Schwalbe, MD Primary Care Provider ?  Nelva Bush, MD Cardiology 12/02/2019   Allergies:  Other  Current Home Medications:   No current facility-administered medications for this encounter.   [name] amiodarone (PACERONE) 200 MG tablet  . apixaban (ELIQUIS) 5 MG TABS tablet  . Ascorbic Acid (VITAMIN C) 1000 MG tablet  . b complex vitamins tablet  . baclofen (LIORESAL) 10 MG tablet  . carvedilol (COREG) 6.25 MG tablet  . clonazePAM (KLONOPIN) 1 MG tablet  . Coenzyme Q10 (COQ10) 400 MG CAPS  . dextromethorphan-guaiFENesin (MUCINEX DM) 30-600 MG 12hr tablet  . docusate sodium (COLACE) 100 MG capsule  . glipiZIDE (GLUCOTROL) 10 MG  tablet  . Hawthorne Berry 550 MG CAPS  . HYDROcodone-acetaminophen (NORCO/VICODIN) 5-325 MG tablet  . Insulin Glargine, 2 Unit  Dial, (TOUJEO MAX SOLOSTAR) 300 UNIT/ML SOPN  . JARDIANCE 10 MG TABS tablet  . magnesium oxide (MAG-OX) 400 MG tablet  . montelukast (SINGULAIR) 10 MG tablet  . Multiple Vitamins-Minerals (PRESERVISION AREDS 2 PO)  . oxybutynin (DITROPAN) 5 MG tablet  . OZEMPIC, 1 MG/DOSE, 2 MG/1.5ML SOPN  . Polyethyl Glycol-Propyl Glycol (SYSTANE) 0.4-0.3 % SOLN  . polyethylene glycol (MIRALAX / GLYCOLAX) packet  . rosuvastatin (CRESTOR) 20 MG tablet  . tamsulosin (FLOMAX) 0.4 MG CAPS capsule  . torsemide (DEMADEX) 20 MG tablet  . Vitamin D, Ergocalciferol, (DRISDOL) 1.25 MG (50000 UT) CAPS capsule   History:   Past Medical History:  Diagnosis Date  . CKD (chronic kidney disease) stage 3, GFR 30-59 ml/min   . Coronary artery disease    a. 1998 s/p ACS Multi-link BMS to the prox LAD ([place], [place]); b. 07/2015 Cath: LM nl, LAD patent stent, LCX min irregs, OM1/2 min irregs, OM3 nl, RCA min irregs.  . Diabetes mellitus type 2, uncontrolled ([place])    a. A1c 11.0 in 07/2015.  [name] HFimpEF (heart failure with improved ejection fraction) ([place])    a. EF 25% by cath in 2013; b. Echo in 07/2015 showing EF of 15-20%, moderate MR, moderate Pulm HTN, severely dilated IVC; c. 11/2016 Echo: EF 55-60%, no rwma, Gr1 DD, mildly dil LA, nl RV fxn.  [name] History of kidney stones   . Hypertension   . Ischemic cardiomyopathy   . Kidney stones    Left  . Paroxysmal atrial fibrillation ([place])    a. Dx 07/2015; b. CHA2DS2VASc = 5-->eliquis. Rhythm control w/ amiodarone.  . Pollen allergies 09-21-15   pt called and stated that he woke up and had some drainage-pt states he did not see the color of the drainage-pt denies running a fever and this only happened once-pt instructed to call Dr Audree Bane office if he starts running a fever or if the color of drainage becomes yellow/green  . Psoriasis   . Pulmonary hypertension ([place])   . Renal disorder    kidney stone  . Sleep apnea    Past Surgical History:  Procedure Laterality  Date  . CARDIAC CATHETERIZATION N/A 07/25/2015   Procedure: Right/Left Heart Cath and Coronary Angiography;  Surgeon: Wellington Hampshire, MD;  Location: [facility];  Service: Cardiovascular;  Laterality: N/A;  . CARDIAC CATHETERIZATION     [place]  . CARDIAC CATHETERIZATION     [facility], [place]    . CYSTOSCOPY WITH STENT PLACEMENT Left 09/07/2015   Procedure: CYSTOSCOPY WITH STENT PLACEMENT;  Surgeon: Hollice Espy, MD;  Location: [facility] ORS;  Service: Urology;  Laterality: Left;  . CYSTOSCOPY/URETEROSCOPY/HOLMIUM LASER/STENT PLACEMENT Left 10/25/2015   Procedure: CYSTOSCOPY/URETEROSCOPY/HOLMIUM LASER/STENT EXCHANGE;  Surgeon: Hollice Espy, MD;  Location: [facility] ORS;  Service: Urology;  Laterality: Left;  . CYSTOSCOPY/URETEROSCOPY/HOLMIUM LASER/STENT PLACEMENT Left 05/11/2019   Procedure: CYSTOSCOPY/URETEROSCOPY/HOLMIUM LASER/STENT PLACEMENT;  Surgeon: Hollice Espy, MD;  Location: [facility] ORS;  Service: Urology;  Laterality: Left;  . EXTRACORPOREAL SHOCK WAVE LITHOTRIPSY Left 04/02/2019   Procedure: EXTRACORPOREAL SHOCK WAVE LITHOTRIPSY (ESWL);  Surgeon: Hollice Espy, MD;  Location: [facility] ORS;  Service: Urology;  Laterality: Left;  . EXTRACORPOREAL SHOCK WAVE LITHOTRIPSY Left 03/05/2019   Procedure: EXTRACORPOREAL SHOCK WAVE LITHOTRIPSY (ESWL);  Surgeon: Abbie Sons, MD;  Location: [facility] ORS;  Service: Urology;  Laterality: Left;  . EYE SURGERY Bilateral    LASER FOR GLAUCOMA  . KIDNEY SURGERY    . NEPHROSTOMY TUBE PLACEMENT ([place] HX) Left 11/2019  . URETEROSCOPY WITH HOLMIUM LASER LITHOTRIPSY Left 09/07/2015   Procedure: URETEROSCOPY WITH HOLMIUM LASER LITHOTRIPSY;  Surgeon: Hollice Espy, MD;  Location: [facility] ORS;  Service: Urology;  Laterality: Left;   Family History  Problem Relation Age of Onset  . Heart failure Father   . Heart attack Brother   . Kidney cancer Neg Hx   . Bladder Cancer Neg Hx   . Prostate cancer Neg Hx     Social History   Tobacco Use  . Smoking status: Never Smoker  . Smokeless tobacco: Never Used  Vaping Use  . Vaping Use: Never used  Substance Use Topics  . Alcohol use: Yes    Alcohol/week: 0.0 standard drinks    Comment: occasionally  . Drug use: No    Types: Marijuana, Cocaine, Opium, LSD    Comment: Past, greater than 10 years ago    Pertinent Clinical Results:  LABS: Labs reviewed: Acceptable for surgery.  Hospital Outpatient Visit on 12/08/2019  Component Date Value Ref Range Status  . Sodium 12/08/2019 140  135 - 145 mmol/L Final  . Potassium 12/08/2019 3.5  3.5 - 5.1 mmol/L Final  . Chloride 12/08/2019 100  98 - 111 mmol/L Final  . CO2 12/08/2019 29  22 - 32 mmol/L Final  . Glucose, Bld 12/08/2019 86  70 - 99 mg/dL Final   Glucose reference range applies only to samples taken after fasting for at least 8 hours.  . BUN 12/08/2019 21  8 - 23 mg/dL Final  . Creatinine, Ser 12/08/2019 1.41* 0.61 - 1.24 mg/dL Final  . Calcium 12/08/2019 8.6* 8.9 - 10.3 mg/dL Final  . GFR calc non Af Amer 12/08/2019 51* >60 mL/min Final  . GFR calc Af Amer 12/08/2019 59* >60 mL/min Final  . Anion gap 12/08/2019 11  5 - 15 Final   Performed at [facility], [address] [place], [place] [address]  . WBC 12/08/2019 7.1  4.0 - 10.5 K/uL Final  . RBC 12/08/2019 3.54* 4.22 - 5.81 MIL/uL Final  . Hemoglobin 12/08/2019 10.5* 13.0 - 17.0 g/dL Final  . HCT 12/08/2019 32.5* 39 - 52 % Final  . MCV 12/08/2019 91.8  80.0 - 100.0 fL Final  . MCH 12/08/2019 29.7  26.0 - 34.0 pg Final  . MCHC 12/08/2019 32.3  30.0 - 36.0 g/dL Final  . RDW 12/08/2019 19.2* 11.5 - 15.5 % Final  . Platelets 12/08/2019 220  150 - 400 K/uL Final  . nRBC 12/08/2019 0.0  0.0 - 0.2 % Final   Performed at [facility], [address], [place], [place] [address]  . Prothrombin Time 12/08/2019 14.0  11.4 - 15.2 seconds Final  . INR 12/08/2019 1.1  0.8 - 1.2 Final   Comment: (NOTE) INR goal varies  based on device and disease states. Performed at [facility], [address] [place], [place] [address]    EKG: Date: 12/02/2019  Time ECG obtained: 1036 AM  Rate: 80 bpm Rhythm: SR with first degree AVB; IVCD Axis (leads I and aVF): Left axis deviation Intervals: PR 232 ms. QTc 519 ms. ST segment and T wave changes: No evidence of ST segment elevation or depression Comparison: Similar to previous tracing obtained on 04/14/2019.  IMAGING / PROCEDURES: ECHOCARDIOGRAM done on 12/03/2019 1. Left ventricular ejection fraction, by estimation, is  40 to 45%. The left ventricle has mild to moderately decreased function. The left ventricle demonstrates global hypokinesis. Left ventricular diastolic parameters are indeterminate. 2. Right ventricular systolic function is mildly reduced. The right ventricular size is moderately enlarged.  3. Left atrial size was moderately dilated.  4. Right atrial size was mildly dilated.  5. The mitral valve is degenerative. Mild mitral valve regurgitation.  6. The aortic valve is tricuspid. Aortic valve regurgitation is not visualized.  7. The inferior vena cava is normal in size with greater than 50% respiratory variability, suggesting right atrial pressure of 3 mmHg.   PULMONARY FUNCTION TESTING done on 12/12/2017 1. No obvious evidence of obstructive or restrictive lung disease. 2. DLCO 67% predicted.   RIGHT/LEFT HEART CATHETERIZATION done on 07/25/2015 1. Moderately elevated filling pressures with moderate pulmonary hypertension. Mildly reduced cardiac output. Hemodynamic features are suggestive of restrictive physiology. 2. Moderate two-vessel coronary artery disease. Patent proximal LAD stent with 60% in-stent restenosis. The LAD and RCA are moderately calcified. 3. Severely reduced LV systolic function by echocardiogram. Left ventricular angiography was not performed due to chronic kidney disease. 4. PA pressure: 56/34 with a mean of 42,  LV pressure: 95/15 with left ventricular end-diastolic pressure of 26. Cardiac output was 4.86 with a cardiac index of 1.9. 5. Dist RCA lesion, 60% stenosed. 6. Prox LAD to Mid LAD lesion, 60% stenosed. The lesion was previously treated with a stent (unknown type) greater than two years ago. 7. Ost 2nd Diag to 2nd Diag lesion, 70% stenosed.  Impression and Plan:  [facility] has been referred for pre-anesthesia review and clearance prior him undergoing the planned anesthetic and procedural courses. Available labs, pertinent testing, and imaging results were personally reviewed by me. This patient has been appropriately cleared by cardiology.   Based on clinical review performed today (12/08/19), barring and significant acute changes in the patient's overall condition, it is anticipated that he will be able to proceed with the planned surgical intervention. Any acute changes in clinical condition may necessitate his procedure being postponed and/or cancelled. Pre-surgical instructions were reviewed with the patient during his PAT appointment and questions were fielded by PAT clinical staff.  Honor Loh, MSN, APRN, FNP-C, CEN [facility]  Peri-operative Services Nurse Practitioner Phone: [phone] 12/08/19 1:44 PM  NOTE: This note has been prepared using Dragon dictation software. Despite my best ability to proofread, there is always the potential that unintentional transcriptional errors may still occur from this process.

## 2019-12-10 ENCOUNTER — Inpatient Hospital Stay
Admission: RE | Admit: 2019-12-10 | Discharge: 2019-12-14 | DRG: 659 | Disposition: A | Discharge status: Home / Self Care | Payer: Medicare Other | Attending: Urology | Admitting: Urology

## 2019-12-10 ENCOUNTER — Encounter
Admission: RE | Disposition: A | Discharge status: Home / Self Care | Payer: Self-pay | Source: Home / Self Care | Attending: Urology

## 2019-12-10 ENCOUNTER — Inpatient Hospital Stay: Payer: Medicare Other | Admitting: Urgent Care

## 2019-12-10 ENCOUNTER — Other Ambulatory Visit: Payer: Self-pay

## 2019-12-10 ENCOUNTER — Encounter: Payer: Self-pay | Admitting: Urology

## 2019-12-10 DIAGNOSIS — E785 Hyperlipidemia, unspecified: Secondary | ICD-10-CM | POA: Diagnosis present

## 2019-12-10 DIAGNOSIS — I480 Paroxysmal atrial fibrillation: Secondary | ICD-10-CM | POA: Diagnosis present

## 2019-12-10 DIAGNOSIS — I152 Hypertension secondary to endocrine disorders: Secondary | ICD-10-CM | POA: Diagnosis present

## 2019-12-10 DIAGNOSIS — N151 Renal and perinephric abscess: Secondary | ICD-10-CM | POA: Diagnosis present

## 2019-12-10 DIAGNOSIS — Z79899 Other long term (current) drug therapy: Secondary | ICD-10-CM | POA: Diagnosis not present

## 2019-12-10 DIAGNOSIS — Z7901 Long term (current) use of anticoagulants: Secondary | ICD-10-CM

## 2019-12-10 DIAGNOSIS — I2510 Atherosclerotic heart disease of native coronary artery without angina pectoris: Secondary | ICD-10-CM | POA: Diagnosis present

## 2019-12-10 DIAGNOSIS — I272 Pulmonary hypertension, unspecified: Secondary | ICD-10-CM | POA: Diagnosis present

## 2019-12-10 DIAGNOSIS — N280 Ischemia and infarction of kidney: Secondary | ICD-10-CM | POA: Diagnosis present

## 2019-12-10 DIAGNOSIS — I255 Ischemic cardiomyopathy: Secondary | ICD-10-CM | POA: Diagnosis present

## 2019-12-10 DIAGNOSIS — Z20822 Contact with and (suspected) exposure to covid-19: Secondary | ICD-10-CM | POA: Diagnosis present

## 2019-12-10 DIAGNOSIS — I4891 Unspecified atrial fibrillation: Secondary | ICD-10-CM | POA: Diagnosis present

## 2019-12-10 DIAGNOSIS — Z87440 Personal history of urinary (tract) infections: Secondary | ICD-10-CM

## 2019-12-10 DIAGNOSIS — Z87442 Personal history of urinary calculi: Secondary | ICD-10-CM | POA: Diagnosis not present

## 2019-12-10 DIAGNOSIS — Z794 Long term (current) use of insulin: Secondary | ICD-10-CM

## 2019-12-10 DIAGNOSIS — N261 Atrophy of kidney (terminal): Secondary | ICD-10-CM | POA: Diagnosis not present

## 2019-12-10 DIAGNOSIS — E1159 Type 2 diabetes mellitus with other circulatory complications: Secondary | ICD-10-CM | POA: Diagnosis present

## 2019-12-10 DIAGNOSIS — E1122 Type 2 diabetes mellitus with diabetic chronic kidney disease: Secondary | ICD-10-CM | POA: Diagnosis present

## 2019-12-10 DIAGNOSIS — E11649 Type 2 diabetes mellitus with hypoglycemia without coma: Secondary | ICD-10-CM | POA: Diagnosis not present

## 2019-12-10 DIAGNOSIS — E1169 Type 2 diabetes mellitus with other specified complication: Secondary | ICD-10-CM | POA: Diagnosis present

## 2019-12-10 DIAGNOSIS — N183 Chronic kidney disease, stage 3 unspecified: Secondary | ICD-10-CM | POA: Diagnosis present

## 2019-12-10 DIAGNOSIS — G4733 Obstructive sleep apnea (adult) (pediatric): Secondary | ICD-10-CM | POA: Diagnosis present

## 2019-12-10 DIAGNOSIS — Z955 Presence of coronary angioplasty implant and graft: Secondary | ICD-10-CM | POA: Diagnosis not present

## 2019-12-10 DIAGNOSIS — N111 Chronic obstructive pyelonephritis: Principal | ICD-10-CM | POA: Diagnosis present

## 2019-12-10 DIAGNOSIS — Z6836 Body mass index (BMI) 36.0-36.9, adult: Secondary | ICD-10-CM

## 2019-12-10 DIAGNOSIS — I5042 Chronic combined systolic (congestive) and diastolic (congestive) heart failure: Secondary | ICD-10-CM | POA: Diagnosis present

## 2019-12-10 DIAGNOSIS — N200 Calculus of kidney: Secondary | ICD-10-CM

## 2019-12-10 HISTORY — PX: ROBOT ASSISTED LAPAROSCOPIC NEPHRECTOMY: SHX5140

## 2019-12-10 LAB — GLUCOSE, CAPILLARY
Glucose-Capillary: 71 mg/dL (ref 70–99)
Glucose-Capillary: 117 mg/dL — ABNORMAL HIGH (ref 70–99)
Glucose-Capillary: 171 mg/dL — ABNORMAL HIGH (ref 70–99)
Glucose-Capillary: 119 mg/dL — ABNORMAL HIGH (ref 70–99)
Glucose-Capillary: 137 mg/dL — ABNORMAL HIGH (ref 70–99)

## 2019-12-10 LAB — ABO/RH: ABO/RH(D): O POS

## 2019-12-10 SURGERY — NEPHRECTOMY, RADICAL, ROBOT-ASSISTED, LAPAROSCOPIC, ADULT
Anesthesia: General | Laterality: Left

## 2019-12-10 MED ORDER — HEPARIN SODIUM (PORCINE) 5000 UNIT/ML IJ SOLN
5000.0000 [IU] | Freq: Three times a day (TID) | INTRAMUSCULAR | Status: DC
  Administered 2019-12-10 – 2019-12-12 (×5): 5000 [IU] via SUBCUTANEOUS
  Filled 2019-12-10 (×5): qty 1

## 2019-12-10 MED ORDER — DIPHENHYDRAMINE HCL 12.5 MG/5ML PO ELIX
12.5000 mg | ORAL_SOLUTION | Freq: Four times a day (QID) | ORAL | Status: DC | PRN
  Filled 2019-12-10: qty 5

## 2019-12-10 MED ORDER — INSULIN ASPART 100 UNIT/ML ~~LOC~~ SOLN: 0.0000 [IU] | Freq: Three times a day (TID) | SUBCUTANEOUS | Status: DC

## 2019-12-10 MED ORDER — FENTANYL CITRATE (PF) 100 MCG/2ML IJ SOLN: 25.0000 ug | INTRAMUSCULAR | Status: DC | PRN

## 2019-12-10 MED ORDER — ONDANSETRON HCL 4 MG/2ML IJ SOLN
INTRAMUSCULAR | Status: DC | PRN
  Administered 2019-12-10: 4 mg via INTRAVENOUS

## 2019-12-10 MED ORDER — PROPOFOL 10 MG/ML IV BOLUS
INTRAVENOUS | Status: AC
  Filled 2019-12-10: qty 40

## 2019-12-10 MED ORDER — ACETAMINOPHEN 325 MG PO TABS: 650.0000 mg | ORAL_TABLET | ORAL | Status: DC | PRN

## 2019-12-10 MED ORDER — SUCCINYLCHOLINE CHLORIDE 20 MG/ML IJ SOLN
INTRAMUSCULAR | Status: DC | PRN
  Administered 2019-12-10: 100 mg via INTRAVENOUS

## 2019-12-10 MED ORDER — BUPIVACAINE LIPOSOME 1.3 % IJ SUSP
INTRAMUSCULAR | Status: AC
  Filled 2019-12-10: qty 20

## 2019-12-10 MED ORDER — OXYBUTYNIN CHLORIDE 5 MG PO TABS
5.0000 mg | ORAL_TABLET | Freq: Three times a day (TID) | ORAL | Status: DC | PRN
  Administered 2019-12-11: 5 mg via ORAL
  Filled 2019-12-10: qty 1

## 2019-12-10 MED ORDER — SODIUM CHLORIDE FLUSH 0.9 % IV SOLN
INTRAVENOUS | Status: AC
  Filled 2019-12-10: qty 10

## 2019-12-10 MED ORDER — PROMETHAZINE HCL 25 MG/ML IJ SOLN
INTRAMUSCULAR | Status: AC
  Administered 2019-12-10: 12.5 mg via INTRAVENOUS
  Filled 2019-12-10: qty 1

## 2019-12-10 MED ORDER — FAMOTIDINE 20 MG PO TABS: 20.0000 mg | ORAL_TABLET | Freq: Once | ORAL | Status: AC

## 2019-12-10 MED ORDER — ROCURONIUM BROMIDE 100 MG/10ML IV SOLN
INTRAVENOUS | Status: DC | PRN
  Administered 2019-12-10: 20 mg via INTRAVENOUS
  Administered 2019-12-10: 50 mg via INTRAVENOUS

## 2019-12-10 MED ORDER — THROMBIN 5000 UNITS EX SOLR
CUTANEOUS | Status: AC
  Filled 2019-12-10: qty 5000

## 2019-12-10 MED ORDER — ONDANSETRON HCL 4 MG/2ML IJ SOLN: 4.0000 mg | INTRAMUSCULAR | Status: DC | PRN

## 2019-12-10 MED ORDER — BUPIVACAINE HCL 0.5 % IJ SOLN
INTRAMUSCULAR | Status: DC | PRN
  Administered 2019-12-10: 30 mL

## 2019-12-10 MED ORDER — INSULIN ASPART 100 UNIT/ML ~~LOC~~ SOLN: 0.0000 [IU] | Freq: Every day | SUBCUTANEOUS | Status: DC

## 2019-12-10 MED ORDER — SODIUM CHLORIDE 0.9 % IV SOLN: INTRAVENOUS | Status: DC

## 2019-12-10 MED ORDER — MONTELUKAST SODIUM 10 MG PO TABS
10.0000 mg | ORAL_TABLET | Freq: Every day | ORAL | Status: DC
  Administered 2019-12-10 – 2019-12-13 (×4): 10 mg via ORAL
  Filled 2019-12-10 (×4): qty 1

## 2019-12-10 MED ORDER — CHLORHEXIDINE GLUCONATE CLOTH 2 % EX PADS
6.0000 | MEDICATED_PAD | Freq: Every day | CUTANEOUS | Status: DC
  Administered 2019-12-11 (×2): 6 via TOPICAL

## 2019-12-10 MED ORDER — PHENYLEPHRINE HCL (PRESSORS) 10 MG/ML IV SOLN
INTRAVENOUS | Status: DC | PRN
  Administered 2019-12-10: 200 ug via INTRAVENOUS
  Administered 2019-12-10 (×2): 100 ug via INTRAVENOUS

## 2019-12-10 MED ORDER — ACETAMINOPHEN 325 MG PO TABS: 325.0000 mg | ORAL_TABLET | ORAL | Status: DC | PRN

## 2019-12-10 MED ORDER — LIDOCAINE HCL (CARDIAC) PF 100 MG/5ML IV SOSY
PREFILLED_SYRINGE | INTRAVENOUS | Status: DC | PRN
  Administered 2019-12-10: 100 mg via INTRAVENOUS

## 2019-12-10 MED ORDER — CLONAZEPAM 1 MG PO TABS
1.0000 mg | ORAL_TABLET | Freq: Every day | ORAL | Status: DC
  Administered 2019-12-10 – 2019-12-13 (×4): 1 mg via ORAL
  Filled 2019-12-10 (×4): qty 1

## 2019-12-10 MED ORDER — EPHEDRINE 5 MG/ML INJ
INTRAVENOUS | Status: AC
  Filled 2019-12-10: qty 10

## 2019-12-10 MED ORDER — DROPERIDOL 2.5 MG/ML IJ SOLN
0.6250 mg | Freq: Once | INTRAMUSCULAR | Status: DC | PRN
  Filled 2019-12-10: qty 2

## 2019-12-10 MED ORDER — ROSUVASTATIN CALCIUM 10 MG PO TABS
20.0000 mg | ORAL_TABLET | Freq: Every evening | ORAL | Status: DC
  Administered 2019-12-10 – 2019-12-13 (×4): 20 mg via ORAL
  Filled 2019-12-10 (×4): qty 2

## 2019-12-10 MED ORDER — FENTANYL CITRATE (PF) 100 MCG/2ML IJ SOLN
INTRAMUSCULAR | Status: DC | PRN
  Administered 2019-12-10: 50 ug via INTRAVENOUS
  Administered 2019-12-10: 75 ug via INTRAVENOUS
  Administered 2019-12-10: 50 ug via INTRAVENOUS
  Administered 2019-12-10: 75 ug via INTRAVENOUS

## 2019-12-10 MED ORDER — ROCURONIUM BROMIDE 10 MG/ML (PF) SYRINGE
PREFILLED_SYRINGE | INTRAVENOUS | Status: AC
  Filled 2019-12-10: qty 10

## 2019-12-10 MED ORDER — FAMOTIDINE 20 MG PO TABS
ORAL_TABLET | ORAL | Status: AC
  Administered 2019-12-10: 20 mg via ORAL
  Filled 2019-12-10: qty 1

## 2019-12-10 MED ORDER — MORPHINE SULFATE (PF) 2 MG/ML IV SOLN
2.0000 mg | INTRAVENOUS | Status: DC | PRN
  Administered 2019-12-10: 2 mg via INTRAVENOUS
  Filled 2019-12-10 (×2): qty 1

## 2019-12-10 MED ORDER — CEFAZOLIN SODIUM-DEXTROSE 2-4 GM/100ML-% IV SOLN
INTRAVENOUS | Status: AC
  Filled 2019-12-10: qty 100

## 2019-12-10 MED ORDER — ORAL CARE MOUTH RINSE: 15.0000 mL | Freq: Once | OROMUCOSAL | Status: AC

## 2019-12-10 MED ORDER — EPHEDRINE SULFATE 50 MG/ML IJ SOLN
INTRAMUSCULAR | Status: DC | PRN
  Administered 2019-12-10: 10 mg via INTRAVENOUS
  Administered 2019-12-10: 5 mg via INTRAVENOUS

## 2019-12-10 MED ORDER — PROPOFOL 10 MG/ML IV BOLUS
INTRAVENOUS | Status: DC | PRN
  Administered 2019-12-10: 130 mg via INTRAVENOUS

## 2019-12-10 MED ORDER — BACLOFEN 10 MG PO TABS
10.0000 mg | ORAL_TABLET | Freq: Every evening | ORAL | Status: DC
  Administered 2019-12-10 – 2019-12-13 (×4): 10 mg via ORAL
  Filled 2019-12-10 (×5): qty 1

## 2019-12-10 MED ORDER — SUGAMMADEX SODIUM 200 MG/2ML IV SOLN
INTRAVENOUS | Status: DC | PRN
  Administered 2019-12-10: 246 mg via INTRAVENOUS

## 2019-12-10 MED ORDER — THROMBIN 5000 UNITS EX KIT
PACK | CUTANEOUS | Status: DC | PRN
  Administered 2019-12-10: 5000 [IU] via TOPICAL

## 2019-12-10 MED ORDER — CHLORHEXIDINE GLUCONATE 0.12 % MT SOLN
OROMUCOSAL | Status: AC
  Administered 2019-12-10: 15 mL via OROMUCOSAL
  Filled 2019-12-10: qty 15

## 2019-12-10 MED ORDER — AMIODARONE HCL 200 MG PO TABS
200.0000 mg | ORAL_TABLET | Freq: Every day | ORAL | Status: DC
  Administered 2019-12-11 – 2019-12-14 (×4): 200 mg via ORAL
  Filled 2019-12-10 (×4): qty 1

## 2019-12-10 MED ORDER — INSULIN ASPART 100 UNIT/ML ~~LOC~~ SOLN: 4.0000 [IU] | Freq: Three times a day (TID) | SUBCUTANEOUS | Status: DC

## 2019-12-10 MED ORDER — ONDANSETRON HCL 4 MG/2ML IJ SOLN
INTRAMUSCULAR | Status: AC
  Filled 2019-12-10: qty 2

## 2019-12-10 MED ORDER — DOCUSATE SODIUM 100 MG PO CAPS
100.0000 mg | ORAL_CAPSULE | Freq: Two times a day (BID) | ORAL | Status: DC
  Administered 2019-12-10 – 2019-12-14 (×8): 100 mg via ORAL
  Filled 2019-12-10 (×8): qty 1

## 2019-12-10 MED ORDER — PROMETHAZINE HCL 25 MG/ML IJ SOLN: 6.2500 mg | INTRAMUSCULAR | Status: DC | PRN

## 2019-12-10 MED ORDER — CEFAZOLIN SODIUM-DEXTROSE 1-4 GM/50ML-% IV SOLN
1.0000 g | Freq: Three times a day (TID) | INTRAVENOUS | Status: AC
  Administered 2019-12-10 (×2): 1 g via INTRAVENOUS
  Filled 2019-12-10 (×2): qty 50

## 2019-12-10 MED ORDER — CHLORHEXIDINE GLUCONATE 0.12 % MT SOLN: 15.0000 mL | Freq: Once | OROMUCOSAL | Status: AC

## 2019-12-10 MED ORDER — ACETAMINOPHEN 160 MG/5ML PO SOLN
325.0000 mg | ORAL | Status: DC | PRN
  Filled 2019-12-10: qty 20.3

## 2019-12-10 MED ORDER — OXYCODONE-ACETAMINOPHEN 5-325 MG PO TABS
1.0000 | ORAL_TABLET | ORAL | Status: DC | PRN
  Administered 2019-12-10 (×2): 2 via ORAL
  Administered 2019-12-11: 1 via ORAL
  Administered 2019-12-11 – 2019-12-13 (×8): 2 via ORAL
  Filled 2019-12-10 (×12): qty 2
  Filled 2019-12-10: qty 1

## 2019-12-10 MED ORDER — TORSEMIDE 20 MG PO TABS
20.0000 mg | ORAL_TABLET | Freq: Two times a day (BID) | ORAL | Status: DC
  Administered 2019-12-10 – 2019-12-14 (×8): 20 mg via ORAL
  Filled 2019-12-10 (×9): qty 1

## 2019-12-10 MED ORDER — BUPIVACAINE LIPOSOME 1.3 % IJ SUSP
INTRAMUSCULAR | Status: DC | PRN
  Administered 2019-12-10: 20 mL

## 2019-12-10 MED ORDER — HYDROCODONE-ACETAMINOPHEN 7.5-325 MG PO TABS
1.0000 | ORAL_TABLET | Freq: Once | ORAL | Status: DC | PRN
  Filled 2019-12-10: qty 1

## 2019-12-10 MED ORDER — SEVOFLURANE IN SOLN
RESPIRATORY_TRACT | Status: AC
  Filled 2019-12-10: qty 250

## 2019-12-10 MED ORDER — LIDOCAINE HCL (PF) 2 % IJ SOLN
INTRAMUSCULAR | Status: AC
  Filled 2019-12-10: qty 5

## 2019-12-10 MED ORDER — CEFAZOLIN SODIUM-DEXTROSE 2-4 GM/100ML-% IV SOLN
2.0000 g | INTRAVENOUS | Status: AC
  Administered 2019-12-10: 2 g via INTRAVENOUS

## 2019-12-10 MED ORDER — BUPIVACAINE HCL (PF) 0.5 % IJ SOLN
INTRAMUSCULAR | Status: AC
  Filled 2019-12-10: qty 30

## 2019-12-10 MED ORDER — CARVEDILOL 6.25 MG PO TABS
6.2500 mg | ORAL_TABLET | Freq: Two times a day (BID) | ORAL | Status: DC
  Administered 2019-12-10 – 2019-12-14 (×5): 6.25 mg via ORAL
  Filled 2019-12-10 (×6): qty 1

## 2019-12-10 MED ORDER — DIPHENHYDRAMINE HCL 50 MG/ML IJ SOLN: 12.5000 mg | Freq: Four times a day (QID) | INTRAMUSCULAR | Status: DC | PRN

## 2019-12-10 MED ORDER — FENTANYL CITRATE (PF) 250 MCG/5ML IJ SOLN
INTRAMUSCULAR | Status: AC
  Filled 2019-12-10: qty 5

## 2019-12-10 MED FILL — Thrombin For Soln 5000 Unit: CUTANEOUS | Qty: 5000 | Status: AC

## 2019-12-10 SURGICAL SUPPLY — 89 items
ANCHOR TIS RET SYS 1550ML (BAG) ×3 IMPLANT
APPLICATOR SURGIFLO ENDO (HEMOSTASIS) ×3 IMPLANT
APPLIER CLIP LOGIC TI 5 (MISCELLANEOUS) IMPLANT
BULB RESERV EVAC DRAIN JP 100C (MISCELLANEOUS) IMPLANT
CANISTER SUCT 1200ML W/VALVE (MISCELLANEOUS) ×3 IMPLANT
CHLORAPREP W/TINT 26 (MISCELLANEOUS) ×6 IMPLANT
CLIP SUT LAPRA TY ABSORB (SUTURE) IMPLANT
CLIP VESOLOCK LG 6/CT PURPLE (CLIP) ×12 IMPLANT
CLIP VESOLOCK MED LG 6/CT (CLIP) IMPLANT
CORD BIP STRL DISP 12FT (MISCELLANEOUS) IMPLANT
CORD MONOPOLAR M/FML 12FT (MISCELLANEOUS) IMPLANT
COVER TIP SHEARS 8 DVNC (MISCELLANEOUS) ×1 IMPLANT
COVER TIP SHEARS 8MM DA VINCI (MISCELLANEOUS) ×2
COVER WAND RF STERILE (DRAPES) ×3 IMPLANT
CUTTER ECHEON FLEX ENDO 45 340 (ENDOMECHANICALS) IMPLANT
DEFOGGER SCOPE WARMER CLEARIFY (MISCELLANEOUS) ×3 IMPLANT
DERMABOND ADVANCED (GAUZE/BANDAGES/DRESSINGS) ×4
DERMABOND ADVANCED .7 DNX12 (GAUZE/BANDAGES/DRESSINGS) ×2 IMPLANT
DRAIN CHANNEL JP 19F (MISCELLANEOUS) IMPLANT
DRAPE 3/4 80X56 (DRAPES) ×3 IMPLANT
DRAPE ARM DVNC X/XI (DISPOSABLE) ×4 IMPLANT
DRAPE COLUMN DVNC XI (DISPOSABLE) ×1 IMPLANT
DRAPE DA VINCI XI ARM (DISPOSABLE) ×8
DRAPE DA VINCI XI COLUMN (DISPOSABLE) ×2
DRAPE SURG 17X11 SM STRL (DRAPES) ×12 IMPLANT
ELECT REM PT RETURN 9FT ADLT (ELECTROSURGICAL) ×3
ELECTRODE REM PT RTRN 9FT ADLT (ELECTROSURGICAL) ×1 IMPLANT
GLOVE BIO SURGEON STRL SZ 6.5 (GLOVE) ×4 IMPLANT
GLOVE BIO SURGEONS STRL SZ 6.5 (GLOVE) ×2
GOWN STRL REUS W/ TWL LRG LVL3 (GOWN DISPOSABLE) ×5 IMPLANT
GOWN STRL REUS W/TWL LRG LVL3 (GOWN DISPOSABLE) ×10
GOWN STRL REUS W/TWL XL LVL4 (GOWN DISPOSABLE) ×3 IMPLANT
GRASPER SUT TROCAR 14GX15 (MISCELLANEOUS) ×3 IMPLANT
HEMOSTAT SURGICEL 2X14 (HEMOSTASIS) ×3 IMPLANT
HOLDER FOLEY CATH W/STRAP (MISCELLANEOUS) IMPLANT
IRRIGATION STRYKERFLOW (MISCELLANEOUS) ×1 IMPLANT
IRRIGATOR STRYKERFLOW (MISCELLANEOUS) ×3
IV NS 1000ML (IV SOLUTION) ×2
IV NS 1000ML BAXH (IV SOLUTION) ×1 IMPLANT
KIT PINK PAD W/HEAD ARE REST (MISCELLANEOUS) ×3
KIT PINK PAD W/HEAD ARM REST (MISCELLANEOUS) ×1 IMPLANT
KIT TURNOVER CYSTO (KITS) ×3 IMPLANT
LABEL OR SOLS (LABEL) ×3 IMPLANT
LOOP RED MAXI  1X406MM (MISCELLANEOUS) ×2
LOOP VESSEL MAXI 1X406 RED (MISCELLANEOUS) ×1 IMPLANT
NEEDLE HYPO 22GX1.5 SAFETY (NEEDLE) ×6 IMPLANT
NEEDLE HYPO 25X1 1.5 SAFETY (NEEDLE) ×3 IMPLANT
NEEDLE INSUFFLATION 14GA 120MM (NEEDLE) ×3 IMPLANT
NS IRRIG 500ML POUR BTL (IV SOLUTION) ×3 IMPLANT
OBTURATOR OPTICAL STANDARD 8MM (TROCAR) ×2
OBTURATOR OPTICAL STND 8 DVNC (TROCAR) ×1
OBTURATOR OPTICALSTD 8 DVNC (TROCAR) ×1 IMPLANT
PACK LAP CHOLECYSTECTOMY (MISCELLANEOUS) ×3 IMPLANT
PENCIL ELECTRO HAND CTR (MISCELLANEOUS) ×3 IMPLANT
RELOAD STAPLER WHITE 60MM (STAPLE) ×4 IMPLANT
RELOAD WH ECHELON 45 (STAPLE) IMPLANT
SEAL CANN UNIV 5-8 DVNC XI (MISCELLANEOUS) ×4 IMPLANT
SEAL XI 5MM-8MM UNIVERSAL (MISCELLANEOUS) ×8
SET TUBE SMOKE EVAC HIGH FLOW (TUBING) ×3 IMPLANT
SLEEVE ENDOPATH XCEL 5M (ENDOMECHANICALS) IMPLANT
SOL .9 NS 3000ML IRR  AL (IV SOLUTION)
SOL .9 NS 3000ML IRR UROMATIC (IV SOLUTION) IMPLANT
SOLUTION ELECTROLUBE (MISCELLANEOUS) ×3 IMPLANT
SPOGE SURGIFLO 8M (HEMOSTASIS) ×2
SPONGE LAP 18X18 RF (DISPOSABLE) ×3 IMPLANT
SPONGE LAP 4X18 RFD (DISPOSABLE) ×3 IMPLANT
SPONGE SURGIFLO 8M (HEMOSTASIS) ×1 IMPLANT
SPONGE VERSALON 4X4 4PLY (MISCELLANEOUS) IMPLANT
STAPLE ECHEON FLEX 60 POW ENDO (STAPLE) IMPLANT
STAPLER RELOAD WHITE 60MM (STAPLE) ×12
STAPLER SKIN PROX 35W (STAPLE) ×3 IMPLANT
SUT DVC VLOC 90 3-0 CV23 UNDY (SUTURE) IMPLANT
SUT ETHILON 3-0 FS-10 30 BLK (SUTURE)
SUT MNCRL AB 4-0 PS2 18 (SUTURE) ×6 IMPLANT
SUT PDS AB 1 CT1 36 (SUTURE) IMPLANT
SUT PDS AB 1 TP1 96 (SUTURE) ×3 IMPLANT
SUT PROLENE 3 0 CT 1 (SUTURE) IMPLANT
SUT PROLENE 5 0 RB 1 DA (SUTURE) ×3 IMPLANT
SUT VIC AB 0 CT1 36 (SUTURE) ×3 IMPLANT
SUT VIC AB 4-0 RB1 27 (SUTURE)
SUT VIC AB 4-0 RB1 27X BRD (SUTURE) IMPLANT
SUT VICRYL 0 AB UR-6 (SUTURE) ×3 IMPLANT
SUT VICRYL PLUS ABS 0 54 (SUTURE) IMPLANT
SUTURE EHLN 3-0 FS-10 30 BLK (SUTURE) IMPLANT
TAPE CLOTH 3X10 WHT NS LF (GAUZE/BANDAGES/DRESSINGS) ×6 IMPLANT
TRAY FOLEY MTR SLVR 16FR STAT (SET/KITS/TRAYS/PACK) ×3 IMPLANT
TROCAR 12M 150ML BLUNT (TROCAR) IMPLANT
TROCAR XCEL 12X100 BLDLESS (ENDOMECHANICALS) ×6 IMPLANT
TROCAR XCEL NON-BLD 5MMX100MML (ENDOMECHANICALS) ×3 IMPLANT

## 2019-12-10 NOTE — Op Note (Signed)
DATE OF PROCEDURE: 12/10/19   PREOPERATIVE DIAGNOSES: Atrophic left kidney, kidney/ ureteral stones, recurrent pyelonephritis  POSTOPERATIVE DIAGNOSES: same as above  PROCEDURE PERFORMED: Left robotic simple nephrectomy  ATTENDING SURGEON: Sherlynn Stalls, MD   Assistant: Nickolas Madrid, MD  ANESTHESIA: General anesthesia.  ESTIMATED BLOOD LOSS: 150 mL.   DRAINS: 16 Fr foley  COMPLICATIONS: None.   SPECIMENS: Left kidney    INDICATION: This is a 69 year old male with a personal history of recurrent left-sided nephrolithiasis requiring multiple procedures, relatively severe left renal atrophy and recurrent pyelonephritis who ultimately has elected to undergo left simple nephrectomy via robotic approach. Risk and benefits of the procedure were explained in detail to the patient who agreed to proceed as planned.   PROCEDURE: The patient was correctly identified in the preoperative holding area and informed consent was confirmed.  He was brought to the operating suite and placed on the table in the supine position. At this time, universal timeout protocol performed. All team members were identified. Venodyne boots were placed and she was administered 2 grams of IV Ancef in the perioperative period. An 41 French Foley catheter was then placed using standard sterile technique. He was then repositioned in the lateral decubitus position with the left flank up and all pressure points were carefully padded. He  was secured to the table using straps, gel pads, and tape.  No axillary roll was used as the patient was not in fact I was asked, rather in sloppy flank on his scapula. He was then prepped and draped in standard surgical fashion. At this point in time, a Veress needle was used in the left lateral midclavicular line 2 fingerbreadths below the costal margin to insufflate the abdominal cavity. There was negative saline drop test without any bloody aspirate and he did have  relatively low opening pressures of 4 mmHg. The abodmen insufflated nicely and it was well tolerated. At this point, all port sites were carefully planned and mapped out across her left hemi-abdomen; Lipsoma marcaine was used in all of the laparoscopic port sites. The port sites were as follows: A 12 mm assistant port was placed in the in the mid right upper quadrant as well as a 5 mm assistant port in the same vertical plane, an additional 3 additional 8 mm left upper quadrant ports along the in the mammary line just below the costal margin, and a single 8 mm robotic port in the left lateral lower abdomen. The initial trocar was placed using a 8 mm robotic site and then the camera was inserted. There was absolutely no injury noted from the Veress needle or trocar sites. The robot was then brought in and docked using bipolar [place] in the left, scissors on the right with a tip up grasper in the 4th arm.   The white line of Toldt was incised and the left hemicolon was reflected medially. There was a nice plane here and Gerota fascia could be easily identified. The ureter and gonadal vein were then identified in the lower portion albeit somewhat difficult initially as there is a significant amount of reactive sticky fat and fibrosis especially around the ureter which was abnormally dilated. The tail of Gerota was then traced superiorly towards the hilum. The gonadal vein was dissected out and eventually ligated using a Weck clips x 2 and transected near the level of the renal vein. The hilum was then further dissected very carefully and precisely such that 1 single renal vein was identified as well as 2  renal arteries just superior to the vein which appeared to be a small accessory as well as the main renal artery just inferior to the vein.  The dissection was then carried up inferiorly and a plane was created between the adrenal gland and the middle aspect of the upper pole of the kidney. The splenorenal ligament  was then incised and the spleen was mobilized superiorly as well. A plane was then created posterior to the kidney, and the kidney was retracted superiorly to place the hilum on stretch.  Notably, the visualization was somewhat difficult and required multiple maneuvers including walking of the kidney up to the abdominal wall and change out the lens to a 0 degree lens based on this year mass/volume of the kidney and perinephric fat.  At this point in time, a 60 mm vascular stapler was used to first ligate the sensory superior renal artery followed by the main renal artery followed by the renal vein. There was no bleeding or complication associated with the stapling of the hilum. The kidney was then further mobilized and the upper pole was eventually able to be freed. The tail of Gerota's was then freed up until the kidney was completely free from its attachments other than by ureter.  I elected to fire a final staple load across the tail of Gerota's thereby stabilizing the ureteral stump.  Consideration was made regarding dissecting the ureter more inferiorly down to level a stone, however due to the significant fibrotic dilated nature and significant surrounding friable tissue, elected not to do so.  Please see H&P for discussion as patient is aware of the possibility of a retained stone within the ureteral stump.  Intraoperatively, the risks of further dissecting the ureter significantly outweigh the benefits.  As such, I did not pursue this.  The hilum was then reinspected as well as the renal fossa and splenorenal ligament. There was no active bleeding noted. Additional hemostasis was achieved using Surgicel near the hilum as well as Surgiflo. At this point in time all remaining trocars were removed under direct visualization. There was no bleeding noted. The 2 assistant ports sites were connected creating approximately 8 cm long incision in a vertical fashion through which the kidney was extracted.  Notably,  the kidney was quite large with a an abundant amount of perinephric fat.  I elected to hand morcellate the benign specimen and removed it by hand and a piece wise fashion rather than creating an extensive open incision.  The abdominal contents were inspected after removal of all kidney tissue and no kidney tissue or hilar fat remained.  There are no boundaries or any other appreciable bleeding noted.  All wounds were irrigated.  The extraction site was closed using a #1 running PDS.  Simple interrupted Vicryls were used to close the dead space.  All skin was closed using 4-0 Monocryl in a subcuticular fashion.  Dermabond was applied as a dressing.  There were no complications in this case. All needle counts and instrument counts as well as laps were correct at the end of the case.    ____________________________ Sherlynn Stalls, MD

## 2019-12-10 NOTE — Transfer of Care (Signed)
Immediate Anesthesia Transfer of Care Note  Patient: [name] [facility]  Procedure(s) Performed: XI ROBOTIC ASSISTED LAPAROSCOPIC NEPHRECTOMY (Left )  Patient Location: PACU  Anesthesia Type:General  Level of Consciousness: awake, alert  and oriented  Airway & Oxygen Therapy: Patient Spontanous Breathing and Patient connected to nasal cannula oxygen  Post-op Assessment: Report given to RN and Post -op Vital signs reviewed and stable  Post vital signs: Reviewed and stable  Last Vitals:  Vitals Value Taken Time  BP    Temp    Pulse    Resp    SpO2      Last Pain:  Vitals:   12/10/19 0637  TempSrc: Tympanic  PainSc: 0-No pain         Complications: No complications documented.

## 2019-12-10 NOTE — Anesthesia Preprocedure Evaluation (Addendum)
Anesthesia Evaluation  Patient identified by MRN, date of birth, ID band Patient awake    Reviewed: Allergy & Precautions, H&P , NPO status , reviewed documented beta blocker date and time   Airway Mallampati: III  TM Distance: >3 FB Neck ROM: limited    Dental  (+) Chipped, Caps, Missing, Poor Dentition   Pulmonary sleep apnea and Continuous Positive Airway Pressure Ventilation ,  Counseled re: OSA/CPAP and GOT risk   Pulmonary exam normal        Cardiovascular hypertension, + CAD and +CHF  Normal cardiovascular exam+ dysrhythmias Atrial Fibrillation   Eval 12/04/19 IMPRESSIONS  1. Left ventricular ejection fraction, by estimation, is 40 to 45%. The  left ventricle has mild to moderately decreased function. The left  ventricle demonstrates global hypokinesis. Left ventricular diastolic  parameters are indeterminate.  2. Right ventricular systolic function is mildly reduced. The right  ventricular size is moderately enlarged.  3. Left atrial size was moderately dilated.  4. Right atrial size was mildly dilated.  5. The mitral valve is degenerative. Mild mitral valve regurgitation.  6. The aortic valve is tricuspid. Aortic valve regurgitation is not  visualized.  7. The inferior vena cava is normal in size with greater than 50%  respiratory variability, suggesting right atrial pressure of 3 mmHg.   [name] at moderate risk for a cardiac complication (36%) related to pending L nephrectomy.  In the absence of chest pain, additional ischemic testing at this time will not likely change his risk and may only serve to delay this necessary surgery.  He may proceed with surgery and hold eliquis for three days prior to surgery.  He should continue his carvedilol and rosuvastatin therapy throughout the perioperative period.  Hx Afib, now SR w 1AVB   Neuro/Psych  Neuromuscular disease    GI/Hepatic neg GERD  ,  Endo/Other  diabetes,  Type 2  Renal/GU Renal disease     Musculoskeletal   Abdominal   Peds  Hematology   Anesthesia Other Findings Past Medical History: No date: CKD (chronic kidney disease) stage 3, GFR 30-59 ml/min No date: Coronary artery disease     Comment:  a. 1998 s/p ACS Multi-link BMS to the prox LAD ([place] b. 07/2015 Cath: LM nl, LAD patent stent,              LCX min irregs, OM1/2 min irregs, OM3 nl, RCA min irregs. No date: Diabetes mellitus type 2, uncontrolled ([place])     Comment:  a. A1c 11.0 in 07/2015. No date: HFimpEF (heart failure with improved ejection fraction) ([place])     Comment:  a. EF 25% by cath in 2013; b. Echo in 07/2015 showing EF              of 15-20%, moderate MR, moderate Pulm HTN, severely               dilated IVC; c. 11/2016 Echo: EF 55-60%, no rwma, Gr1 DD,               mildly dil LA, nl RV fxn. No date: History of kidney stones No date: Hypertension No date: Ischemic cardiomyopathy No date: Kidney stones     Comment:  Left No date: Paroxysmal atrial fibrillation ([place])     Comment:  a. Dx 07/2015; b. CHA2DS2VASc = 5-->eliquis. Rhythm  control w/ amiodarone. 09-21-15: Pollen allergies     Comment:  pt called and stated that he woke up and had some               drainage-pt states he did not see the color of the               drainage-pt denies running a fever and this only happened              once-pt instructed to call Dr Audree Bane office if he               starts running a fever or if the color of drainage               becomes yellow/green No date: Psoriasis No date: Pulmonary hypertension ([place]) No date: Renal disorder     Comment:  kidney stone No date: Sleep apnea Past Surgical History: 07/25/2015: CARDIAC CATHETERIZATION; N/A     Comment:  Procedure: Right/Left Heart Cath and Coronary               Angiography;  Surgeon: Wellington Hampshire, MD;  Location:               [place] [facility];  Service:  Cardiovascular;                Laterality: N/A; No date: CARDIAC CATHETERIZATION     Comment:  [place] No date: CARDIAC CATHETERIZATION     Comment:  [place], [place] No date: CORONARY ANGIOPLASTY WITH STENT PLACEMENT 09/07/2015: [place]; Left     Comment:  Procedure: CYSTOSCOPY WITH STENT PLACEMENT;  Surgeon:               Hollice Espy, MD;  Location: [facility] OR[facility];  Service:               Urology;  Laterality: Left; 10/25/2015: CYSTOSCOPY/URETEROSCOPY/HOLMIUM LASER/STENT PLACEMENT; Left     Comment:  Procedure: CYSTOSCOPY/URETEROSCOPY/HOLMIUM LASER/STENT               EXCHANGE;  Surgeon: Hollice Espy, MD;  Location: [facility]               ORS;  Service: Urology;  Laterality: Left; 05/11/2019: CYSTOSCOPY/URETEROSCOPY/HOLMIUM LASER/STENT PLACEMENT; Left     Comment:  Procedure: CYSTOSCOPY/URETEROSCOPY/HOLMIUM LASER/STENT               PLACEMENT;  Surgeon: Hollice Espy, MD;  Location: [facility]              ORS;  Service: Urology;  Laterality: Left; 04/02/2019: EXTRACORPOREAL SHOCK WAVE LITHOTRIPSY; Left     Comment:  Procedure: EXTRACORPOREAL SHOCK WAVE LITHOTRIPSY (ESWL);              Surgeon: Hollice Espy, MD;  Location: [facility] ORS;                Service: Urology;  Laterality: Left; 03/05/2019: EXTRACORPOREAL SHOCK WAVE LITHOTRIPSY; Left     Comment:  Procedure: EXTRACORPOREAL SHOCK WAVE LITHOTRIPSY (ESWL);              Surgeon: Abbie Sons, MD;  Location: [facility] ORS;                Service: Urology;  Laterality: Left; No date: EYE SURGERY; Bilateral     Comment:  LASER FOR GLAUCOMA No date: KIDNEY SURGERY 11/2019: NEPHROSTOMY TUBE PLACEMENT ([place] HX); Left 09/07/2015: URETEROSCOPY WITH HOLMIUM LASER LITHOTRIPSY; Left     Comment:  Procedure: URETEROSCOPY WITH HOLMIUM LASER LITHOTRIPSY;               Surgeon: Hollice Espy, MD;  Location: [facility] ORS;                Service: Urology;  Laterality: Left; BMI    Body Mass Index: 36.78 kg/m      Reproductive/Obstetrics                           Anesthesia Physical Anesthesia Plan  ASA: IV  Anesthesia Plan: General   Post-op Pain Management:    Induction: Intravenous  PONV Risk Score and Plan: Ondansetron and Treatment may vary due to age or medical condition  Airway Management Planned: Oral ETT  Additional Equipment:   Intra-op Plan:   Post-operative Plan: Extubation in OR  Informed Consent: I have reviewed the patients History and Physical, chart, labs and discussed the procedure including the risks, benefits and alternatives for the proposed anesthesia with the patient or authorized representative who has indicated his/her understanding and acceptance.     Dental Advisory Given  Plan Discussed with: CRNA  Anesthesia Plan Comments:         Anesthesia Quick Evaluation

## 2019-12-10 NOTE — Anesthesia Procedure Notes (Signed)
Procedure Name: Intubation Date/Time: 12/10/2019 7:55 AM Performed by: Chanetta Marshall, CRNA Pre-anesthesia Checklist: Patient identified, Emergency Drugs available, Suction available and Patient being monitored Patient Re-evaluated:Patient Re-evaluated prior to induction Oxygen Delivery Method: Circle system utilized Preoxygenation: Pre-oxygenation with 100% oxygen Induction Type: IV induction Ventilation: Mask ventilation with difficulty Laryngoscope Size: McGraph and 3 Grade View: Grade II Tube type: Oral Tube size: 7.5 mm Number of attempts: 1 Airway Equipment and Method: Stylet and Video-laryngoscopy Placement Confirmation: ETT inserted through vocal cords under direct vision,  positive ETCO2,  breath sounds checked- equal and bilateral and CO2 detector Secured at: 21 cm Tube secured with: Tape Dental Injury: Teeth and Oropharynx as per pre-operative assessment

## 2019-12-10 NOTE — Interval H&P Note (Signed)
Patient seen and examined  H&P up-to-date  Regular rate and rhythm Clear to auscultation bilaterally  No additional changes, cardiac clearance obtained.

## 2019-12-11 ENCOUNTER — Encounter: Payer: Self-pay | Admitting: Urology

## 2019-12-11 LAB — CBC
HCT: 28.3 % — ABNORMAL LOW (ref 39.0–52.0)
Hemoglobin: 9 g/dL — ABNORMAL LOW (ref 13.0–17.0)
MCH: 29.5 pg (ref 26.0–34.0)
MCHC: 31.8 g/dL (ref 30.0–36.0)
MCV: 92.8 fL (ref 80.0–100.0)
Platelets: 187 10*3/uL (ref 150–400)
RBC: 3.05 MIL/uL — ABNORMAL LOW (ref 4.22–5.81)
RDW: 19.2 % — ABNORMAL HIGH (ref 11.5–15.5)
WBC: 7 10*3/uL (ref 4.0–10.5)
nRBC: 0 % (ref 0.0–0.2)

## 2019-12-11 LAB — BASIC METABOLIC PANEL
Anion gap: 6 (ref 5–15)
BUN: 24 mg/dL — ABNORMAL HIGH (ref 8–23)
CO2: 28 mmol/L (ref 22–32)
Calcium: 7.8 mg/dL — ABNORMAL LOW (ref 8.9–10.3)
Chloride: 105 mmol/L (ref 98–111)
Creatinine, Ser: 2.03 mg/dL — ABNORMAL HIGH (ref 0.61–1.24)
GFR calc Af Amer: 38 mL/min — ABNORMAL LOW (ref >60)
GFR calc non Af Amer: 33 mL/min — ABNORMAL LOW (ref >60)
Glucose, Bld: 71 mg/dL (ref 70–99)
Potassium: 3.7 mmol/L (ref 3.5–5.1)
Sodium: 139 mmol/L (ref 135–145)

## 2019-12-11 LAB — GLUCOSE, CAPILLARY
Glucose-Capillary: 60 mg/dL — ABNORMAL LOW (ref 70–99)
Glucose-Capillary: 49 mg/dL — ABNORMAL LOW (ref 70–99)
Glucose-Capillary: 62 mg/dL — ABNORMAL LOW (ref 70–99)
Glucose-Capillary: 112 mg/dL — ABNORMAL HIGH (ref 70–99)
Glucose-Capillary: 92 mg/dL (ref 70–99)
Glucose-Capillary: 164 mg/dL — ABNORMAL HIGH (ref 70–99)

## 2019-12-11 MED ORDER — GLIPIZIDE 10 MG PO TABS
10.0000 mg | ORAL_TABLET | Freq: Two times a day (BID) | ORAL | Status: DC
  Administered 2019-12-11 – 2019-12-14 (×6): 10 mg via ORAL
  Filled 2019-12-11 (×7): qty 1

## 2019-12-11 MED ORDER — EMPAGLIFLOZIN 10 MG PO TABS: 10.0000 mg | ORAL_TABLET | Freq: Every evening | ORAL | Status: DC

## 2019-12-11 MED ORDER — INSULIN GLARGINE 100 UNIT/ML ~~LOC~~ SOLN
40.0000 [IU] | SUBCUTANEOUS | Status: DC
  Administered 2019-12-12 – 2019-12-14 (×3): 40 [IU] via SUBCUTANEOUS
  Filled 2019-12-11 (×3): qty 0.4

## 2019-12-11 NOTE — Progress Notes (Signed)
Hypoglycemic Event  CBG: 60  Treatment: juice, icee  Symptoms: none  Follow-up CBG: [id number] CBG Result:62  Possible Reasons for Event: clear liquid diet  Comments/MD notified:MD aware. Diet changed to carb. Patient said that he was not going to have his blood sugar rechecked for at least 2 hours. Nursing will watch for signs/symptoms of hypoglycemia. Patient did drink 2 OJ's, 1 grape juice, jello, and icee after blood sugar was checked    [name]

## 2019-12-11 NOTE — Progress Notes (Addendum)
BP 99/51. HR 75. Patient scheduled to get coreg and torsemide. Patient insisted on taking his torsemide. Dr Erlene Quan agreed to hold am coreg and insulin (CBG 60) and allow patient to take torsemide. Order given to change diet to carb diet

## 2019-12-11 NOTE — TOC Initial Note (Signed)
Transition of Care [facility]) - Initial/Assessment Note    Patient Details  Name: [name] MRN: [id number] Date of Birth: Dec 24, 1950  [facility] CM/SW Contact:    Candie Chroman, LCSW Phone Number: 12/11/2019, 10:44 AM  Clinical Narrative: Readmission prevention screen complete. CSW met with patient. No supports at bedside. CSW introduced role and explained that discharge planning would be discussed. Patient lives home with his roommate and three dogs. PCP is Vidal Schwalbe, MD at [facility] Either patient or his roommates drives him to his appointments. Pharmacy is [facility]. No issues obtaining medications. Patient uses a cane if needed at home. He also has a walker. No home health prior to admission. No further concerns. CSW encouraged patient to contact CSW as needed. CSW will continue to follow patient for support and facilitate return home when stable.             Expected Discharge Plan: Home/Self Care Barriers to Discharge: Continued Medical Work up   Patient Goals and CMS Choice        Expected Discharge Plan and Services Expected Discharge Plan: Home/Self Care     Post Acute Care Choice: NA Living arrangements for the past 2 months: Single Family Home                                      Prior Living Arrangements/Services Living arrangements for the past 2 months: Single Family Home Lives with:: Roommate, Pets Patient language and need for interpreter reviewed:: Yes Do you feel safe going back to the place where you live?: Yes      Need for Family Participation in Patient Care: Yes (Comment) Care giver support system in place?: Yes (comment) Current home services: DME Criminal Activity/Legal Involvement Pertinent to Current Situation/Hospitalization: No - Comment as needed  Activities of Daily Living Home Assistive Devices/Equipment: CPAP, Eyeglasses, CBG Meter ADL Screening (condition at time of  admission) Patient's cognitive ability adequate to safely complete daily activities?: Yes Is the patient deaf or have difficulty hearing?: No Does the patient have difficulty seeing, even when wearing glasses/contacts?: No Does the patient have difficulty concentrating, remembering, or making decisions?: No Patient able to express need for assistance with ADLs?: Yes Does the patient have difficulty dressing or bathing?: No Independently performs ADLs?: Yes (appropriate for developmental age) Does the patient have difficulty walking or climbing stairs?: No Weakness of Legs: None Weakness of Arms/Hands: None  Permission Sought/Granted                  Emotional Assessment Appearance:: Appears stated age Attitude/Demeanor/Rapport: Engaged, Gracious Affect (typically observed): Appropriate, Calm, Pleasant Orientation: : Oriented to Self, Oriented to Place, Oriented to  Time, Oriented to Situation Alcohol / Substance Use: Not Applicable Psych Involvement: No (comment)  Admission diagnosis:  Left nephrolithiasis [N20.0] Patient Active Problem List   Diagnosis Date Noted  . Left nephrolithiasis 12/10/2019  . Acute pyelonephritis 11/12/2019  . Chronic diastolic CHF (congestive heart failure) ([place]) 05/12/2018  . Chronic heart failure with preserved ejection fraction (HFpEF) ([place]) 12/05/2017  . NICM (nonischemic cardiomyopathy) ([place]) 04/18/2017  . Non-rheumatic mitral regurgitation 04/18/2017  . Chronic kidney disease, stage III (moderate) 04/18/2017  . Type 2 diabetes mellitus with diabetic polyneuropathy, without long-term current use of insulin ([place]) 02/27/2017  . Hyperlipidemia due to type 2 diabetes mellitus ([place]) 02/27/2017  . Left ureteral stone 10/25/2015  .  Coronary artery disease involving native coronary artery of native heart without angina pectoris 09/22/2015  . First degree AV block 09/22/2015  . Hypertension associated with diabetes ([place]) 09/22/2015  . Hyperlipidemia  LDL goal <70 09/22/2015  . Hypotension 08/09/2015  . Hydronephrosis with renal and ureteral calculus obstruction   . Kidney stone on left side   . OSA (obstructive sleep apnea)   . Hyponatremia 07/13/2015  . Uncontrolled type 2 diabetes mellitus ([place]) 07/13/2015  . Pulmonary hypertension ([place])   . Cardiorenal syndrome   . Morbid obesity due to excess calories ([name])   . Paroxysmal atrial fibrillation ([place])    PCP:  Vidal Schwalbe, MD Pharmacy:   [place], [place] - [address] [place] [address] Phone: [phone] Fax: [phone]     Social Determinants of Health (SDOH) Interventions    Readmission Risk Interventions Readmission Risk Prevention Plan 12/11/2019  Transportation Screening Complete  PCP or Specialist Appt within 3-5 Days Complete  Social Work Consult for [place] Planning/Counseling [place] Not Applicable  Medication Review Press photographer) Complete  Some recent data might be hidden

## 2019-12-11 NOTE — Progress Notes (Signed)
Order received from Dr Erlene Quan to discontinue foley and IVF

## 2019-12-11 NOTE — Progress Notes (Signed)
Dr Erlene Quan said it was okay to hold insuline 4 units due at lunch time since patients CBG is currently 112 but was 60 this am

## 2019-12-11 NOTE — Progress Notes (Signed)
1 Day Post-Op Subjective: The patient is doing well.  No nausea or vomiting. Pain is adequately controlled.  OOB standing only, not ambulating yet.  Hypoglycemic event this a.m., blood sugar 60 but asymptomatic.  Treated.  Call her  Objective: Vital signs in last 24 hours: Temp:  [98.2 F (36.8 C)-99.5 F (37.5 C)] 99.5 F (37.5 C) (07/09 1125) Pulse Rate:  [75-83] 83 (07/09 1125) Resp:  [17-20] 17 (07/09 1125) BP: (99-125)/(49-61) 110/55 (07/09 1125) SpO2:  [93 %-100 %] 93 % (07/09 1125)  Intake/Output from previous day: 07/08 0701 - 07/09 0700 In: 2816 [P.O.:720; I.V.:1996; IV Piggyback:100] Out: 550 [Urine:450; Blood:100] Intake/Output this shift: Total I/O In: -  Out: 1300 [Urine:1300]  Physical Exam:  General: Alert and oriented. Lungs: No increased work of breathing. GI: Soft, Nondistended. Incisions: Clean and dry.   Urine: Clear, Foley in place Extremities: Nontender, no erythema, no edema.  Lab Results: Recent Labs    12/11/19 0531  HGB 9.0*  HCT 28.3*          Recent Labs    12/08/19 1154 12/11/19 0531  CREATININE 1.41* 2.03*           Results for orders placed or performed during the hospital encounter of 12/10/19 (from the past 24 hour(s))  Glucose, capillary     Status: Abnormal   Collection Time: 12/10/19  6:40 PM  Result Value Ref Range   Glucose-Capillary 119 (H) 70 - 99 mg/dL  Glucose, capillary     Status: Abnormal   Collection Time: 12/10/19  8:51 PM  Result Value Ref Range   Glucose-Capillary 137 (H) 70 - 99 mg/dL  CBC     Status: Abnormal   Collection Time: 12/11/19  5:31 AM  Result Value Ref Range   WBC 7.0 4.0 - 10.5 K/uL   RBC 3.05 (L) 4.22 - 5.81 MIL/uL   Hemoglobin 9.0 (L) 13.0 - 17.0 g/dL   HCT 28.3 (L) 39 - 52 %   MCV 92.8 80.0 - 100.0 fL   MCH 29.5 26.0 - 34.0 pg   MCHC 31.8 30.0 - 36.0 g/dL   RDW 19.2 (H) 11.5 - 15.5 %   Platelets 187 150 - 400 K/uL   nRBC 0.0 0.0 - 0.2 %  Basic metabolic panel     Status: Abnormal    Collection Time: 12/11/19  5:31 AM  Result Value Ref Range   Sodium 139 135 - 145 mmol/L   Potassium 3.7 3.5 - 5.1 mmol/L   Chloride 105 98 - 111 mmol/L   CO2 28 22 - 32 mmol/L   Glucose, Bld 71 70 - 99 mg/dL   BUN 24 (H) 8 - 23 mg/dL   Creatinine, Ser 2.03 (H) 0.61 - 1.24 mg/dL   Calcium 7.8 (L) 8.9 - 10.3 mg/dL   GFR calc non Af Amer 33 (L) >60 mL/min   GFR calc Af Amer 38 (L) >60 mL/min   Anion gap 6 5 - 15  Glucose, capillary     Status: Abnormal   Collection Time: 12/11/19  8:09 AM  Result Value Ref Range   Glucose-Capillary 60 (L) 70 - 99 mg/dL  Glucose, capillary     Status: Abnormal   Collection Time: 12/11/19  8:43 AM  Result Value Ref Range   Glucose-Capillary 49 (L) 70 - 99 mg/dL  Glucose, capillary     Status: Abnormal   Collection Time: 12/11/19  8:44 AM  Result Value Ref Range   Glucose-Capillary 62 (L) 70 -  99 mg/dL  Glucose, capillary     Status: Abnormal   Collection Time: 12/11/19 11:46 AM  Result Value Ref Range   Glucose-Capillary 112 (H) 70 - 99 mg/dL    Assessment/Plan: POD# 1 s/p robotic left nephrectomy.  1) Ambulate, Incentive spirometry 2) Advance diet as tolerated 3) Transition to oral pain medication 4) D/C Foley 5) D/C fluids 6) resume all home meds except Eliquis (including DM meds) may start this tomorrow depending on hemoglobin 7) PT eval   Hollice Espy, MD   LOS: 1 day   Hollice Espy 12/11/2019, 4:09 PM

## 2019-12-12 LAB — GLUCOSE, CAPILLARY
Glucose-Capillary: 41 mg/dL — CL (ref 70–99)
Glucose-Capillary: 87 mg/dL (ref 70–99)
Glucose-Capillary: 104 mg/dL — ABNORMAL HIGH (ref 70–99)
Glucose-Capillary: 114 mg/dL — ABNORMAL HIGH (ref 70–99)
Glucose-Capillary: 168 mg/dL — ABNORMAL HIGH (ref 70–99)

## 2019-12-12 LAB — CBC
HCT: 27.3 % — ABNORMAL LOW (ref 39.0–52.0)
Hemoglobin: 8.7 g/dL — ABNORMAL LOW (ref 13.0–17.0)
MCH: 29.4 pg (ref 26.0–34.0)
MCHC: 31.9 g/dL (ref 30.0–36.0)
MCV: 92.2 fL (ref 80.0–100.0)
Platelets: 180 10*3/uL (ref 150–400)
RBC: 2.96 MIL/uL — ABNORMAL LOW (ref 4.22–5.81)
RDW: 18.9 % — ABNORMAL HIGH (ref 11.5–15.5)
WBC: 8.8 10*3/uL (ref 4.0–10.5)
nRBC: 0 % (ref 0.0–0.2)

## 2019-12-12 LAB — BASIC METABOLIC PANEL
Anion gap: 9 (ref 5–15)
BUN: 26 mg/dL — ABNORMAL HIGH (ref 8–23)
CO2: 28 mmol/L (ref 22–32)
Calcium: 8 mg/dL — ABNORMAL LOW (ref 8.9–10.3)
Chloride: 101 mmol/L (ref 98–111)
Creatinine, Ser: 2.38 mg/dL — ABNORMAL HIGH (ref 0.61–1.24)
GFR calc Af Amer: 31 mL/min — ABNORMAL LOW (ref >60)
GFR calc non Af Amer: 27 mL/min — ABNORMAL LOW (ref >60)
Glucose, Bld: 61 mg/dL — ABNORMAL LOW (ref 70–99)
Potassium: 3.6 mmol/L (ref 3.5–5.1)
Sodium: 138 mmol/L (ref 135–145)

## 2019-12-12 MED ORDER — APIXABAN 5 MG PO TABS
5.0000 mg | ORAL_TABLET | Freq: Two times a day (BID) | ORAL | Status: DC
  Administered 2019-12-13 – 2019-12-14 (×3): 5 mg via ORAL
  Filled 2019-12-12 (×3): qty 1

## 2019-12-12 NOTE — Evaluation (Signed)
Physical Therapy Evaluation Patient Details Name: Manual Navarra MRN: [id number] DOB: Dec 19, 1950 Today's Date: 12/12/2019   History of Present Illness  Patient is a 69 year male s/p L nephrectomy. Pertinent PMH includes: CAD, CHF, non-ischemia cardiomyopathy, HLD, OSA, HTN, PAF, pulmonary HTN, CKD stage III, T2DM, nephrostomy tube placement. PLOF ind with ADLs, lives with housemate, completes all errands, drives.  Clinical Impression  Pt is reluctant to PT d/t abdominal incision pain, but is motivated to attempt mobility. PT educated patient in log roll to aid in pain management of supine > sit transfer with decent carry over. PT educated patient on STS transfer with RW on hand placement and set up, but patient ultimately stands using sink to pull up on. Patient is more compliant with stand to sit transfer following amb with good safety. PT led patient through ambulation over 125ft with RW with cuing for foot clearance with education on fall risk reduction with good carry over. PT recommendation HHPT should patient continue mobility progress. Would benefit from skilled PT to address above deficits and promote optimal return to PLOF.     Follow Up Recommendations Home health PT    Equipment Recommendations  Rolling walker with 5" wheels    Recommendations for Other Services OT consult     Precautions / Restrictions Precautions Precautions: Fall Restrictions Weight Bearing Restrictions: No      Mobility  Bed Mobility Overal bed mobility: Needs Assistance Bed Mobility: Supine to Sit     Supine to sit: Modified independent (Device/Increase time);HOB elevated     General bed mobility comments: Educated in log roll with decent carry over  Transfers Overall transfer level: Needs assistance Equipment used: Rolling walker (2 wheeled) Transfers: Sit to/from Stand Sit to Stand: Modified independent (Device/Increase time);Supervision         General transfer comment: Unable to  comoply with cuing for hand placement for safety, pulls up on sink to stand  Ambulation/Gait Ambulation/Gait assistance: Min guard Gait Distance (Feet): 150 Feet Assistive device: Rolling walker (2 wheeled) Gait Pattern/deviations: Shuffle Gait velocity: decreased   General Gait Details: Cuing for increased foot clearance bilat to prevent falls  Stairs            Wheelchair Mobility    Modified Rankin (Stroke Patients Only)       Balance Overall balance assessment: Needs assistance         Standing balance support: During functional activity Standing balance-Leahy Scale: Fair                               Pertinent Vitals/Pain Pain Assessment: Faces Faces Pain Scale: Hurts a little bit Pain Descriptors / Indicators: Aching Pain Intervention(s): Limited activity within patient's tolerance    Home Living Family/patient expects to be discharged to:: Private residence Living Arrangements: Non-relatives/Friends Available Help at Discharge: Friend(s);Available 24 hours/day Type of Home: House Home Access: Stairs to enter   [employer] of Steps: 4 Home Layout: One level Home Equipment: Cane - single point;Wheelchair - Insurance claims handler - 4 wheels      Prior Function Level of Independence: Independent with assistive device(s)         Comments: Pt reports he uses an AD occasionally for ambulation during times when he has vertigo     Hand Dominance        Extremity/Trunk Assessment   Upper Extremity Assessment Upper Extremity Assessment: Overall WFL for tasks assessed    Lower  Extremity Assessment Lower Extremity Assessment: Overall WFL for tasks assessed    Cervical / Trunk Assessment Cervical / Trunk Assessment: Normal  Communication   Communication: No difficulties  Cognition Arousal/Alertness: Awake/alert Behavior During Therapy: WFL for tasks assessed/performed Overall Cognitive Status: Within Functional  Limits for tasks assessed                                        General Comments      Exercises Other Exercises Other Exercises: Supine > sit: education on log rolilng with some carry over, good use of HOB elevateed and handrails to assist Other Exercises: STS: patient unable to comply with cuing for hand placement/set up, patient pulls up on sink to stand, education on safety of completing with RW Other Exercises: Amb with RW 141ft with cuing for foot clearing to prevent shuffle, with education on this and RW safety with decent carry over. Cuing for controlled sit with decent carry over   Assessment/Plan    PT Assessment Patient needs continued PT services  PT Problem List Decreased mobility;Decreased safety awareness;Decreased coordination;Decreased activity tolerance;Decreased balance       PT Treatment Interventions DME instruction;Therapeutic activities;Gait training;Therapeutic exercise;Stair training;Balance training;Functional mobility training;Neuromuscular re-education;Manual techniques    PT Goals (Current goals can be found in the Care Plan section)  Acute Rehab PT Goals Patient Stated Goal: To go home PT Goal Formulation: With patient Time For Goal Achievement: 12/26/19 Potential to Achieve Goals: Fair    Frequency Min 2X/week   Barriers to discharge Decreased caregiver support      Co-evaluation               AM-PAC PT "6 Clicks" Mobility  Outcome Measure Help needed turning from your back to your side while in a flat bed without using bedrails?: A Little Help needed moving from lying on your back to sitting on the side of a flat bed without using bedrails?: A Little Help needed moving to and from a bed to a chair (including a wheelchair)?: A Little Help needed standing up from a chair using your arms (e.g., wheelchair or bedside chair)?: A Little Help needed to walk in hospital room?: A Little Help needed climbing 3-5 steps with a  railing? : A Lot 6 Click Score: 17    End of Session Equipment Utilized During Treatment: Gait belt Activity Tolerance: Patient tolerated treatment well Patient left: in chair;with chair alarm set;with call bell/phone within reach Nurse Communication: Mobility status PT Visit Diagnosis: Unsteadiness on feet (R26.81);Other abnormalities of gait and mobility (R26.89);History of falling (Z91.81);Pain Pain - part of body:  (abdomen)    Time: 2778-2423 PT Time Calculation (min) (ACUTE ONLY): 23 min   Charges:   PT Evaluation $PT Eval Moderate Complexity: 1 Mod PT Treatments $Therapeutic Activity: 23-37 mins        Durwin Reges DPT  Durwin Reges 12/12/2019, 1:06 PM

## 2019-12-12 NOTE — Progress Notes (Signed)
2 Days Post-Op Subjective: Tolerating diet.  Passing flatus but no BM.  Up with PT today but not otherwise.  Voiding spontaneously.  Pain well controlled.  Does not have a ride home until Monday.  Objective: Vital signs in last 24 hours: Temp:  [97.7 F (36.5 C)-98.5 F (36.9 C)] 98.1 F (36.7 C) (07/10 1154) Pulse Rate:  [74-87] 77 (07/10 1154) Resp:  [16-20] 20 (07/10 1154) BP: (94-110)/(47-51) 94/47 (07/10 1154) SpO2:  [71 %-100 %] 95 % (07/10 1154)  Intake/Output from previous day: 07/09 0701 - 07/10 0700 In: -  Out: 1750 [Urine:1750] Intake/Output this shift: Total I/O In: -  Out: 950 [Urine:950]  Physical Exam:  General: Alert and oriented. Lungs: No increased work of breathing. GI: Soft, Nondistended. Incisions: Clean and dry.  Small amount of subcutaneous crepitus.  Minimal bruising.  Intact. Extremities: 1+ lower extremity edema, soft compression appreciated  Lab Results: Recent Labs    12/11/19 0531 12/12/19 0735  HGB 9.0* 8.7*  HCT 28.3* 27.3*          Recent Labs    12/08/19 1154 12/11/19 0531 12/12/19 0735  CREATININE 1.41* 2.03* 2.38*           Results for orders placed or performed during the hospital encounter of 12/10/19 (from the past 24 hour(s))  Glucose, capillary     Status: None   Collection Time: 12/11/19  4:59 PM  Result Value Ref Range   Glucose-Capillary 92 70 - 99 mg/dL  Glucose, capillary     Status: Abnormal   Collection Time: 12/11/19  9:16 PM  Result Value Ref Range   Glucose-Capillary 164 (H) 70 - 99 mg/dL  CBC     Status: Abnormal   Collection Time: 12/12/19  7:35 AM  Result Value Ref Range   WBC 8.8 4.0 - 10.5 K/uL   RBC 2.96 (L) 4.22 - 5.81 MIL/uL   Hemoglobin 8.7 (L) 13.0 - 17.0 g/dL   HCT 27.3 (L) 39 - 52 %   MCV 92.2 80.0 - 100.0 fL   MCH 29.4 26.0 - 34.0 pg   MCHC 31.9 30.0 - 36.0 g/dL   RDW 18.9 (H) 11.5 - 15.5 %   Platelets 180 150 - 400 K/uL   nRBC 0.0 0.0 - 0.2 %  Basic metabolic panel     Status:  Abnormal   Collection Time: 12/12/19  7:35 AM  Result Value Ref Range   Sodium 138 135 - 145 mmol/L   Potassium 3.6 3.5 - 5.1 mmol/L   Chloride 101 98 - 111 mmol/L   CO2 28 22 - 32 mmol/L   Glucose, Bld 61 (L) 70 - 99 mg/dL   BUN 26 (H) 8 - 23 mg/dL   Creatinine, Ser 2.38 (H) 0.61 - 1.24 mg/dL   Calcium 8.0 (L) 8.9 - 10.3 mg/dL   GFR calc non Af Amer 27 (L) >60 mL/min   GFR calc Af Amer 31 (L) >60 mL/min   Anion gap 9 5 - 15  Glucose, capillary     Status: Abnormal   Collection Time: 12/12/19  7:55 AM  Result Value Ref Range   Glucose-Capillary 41 (LL) 70 - 99 mg/dL   Comment 1 Notify RN   Glucose, capillary     Status: None   Collection Time: 12/12/19  8:36 AM  Result Value Ref Range   Glucose-Capillary 87 70 - 99 mg/dL  Glucose, capillary     Status: Abnormal   Collection Time: 12/12/19 11:51 AM  Result  Value Ref Range   Glucose-Capillary 104 (H) 70 - 99 mg/dL    Assessment/Plan: POD# 2 s/p robotic left nephrectomy.  1) Ambulate, Incentive spirometry 2) resume all home meds including Eliquis 3) Wean 02   Hollice Espy, MD   LOS: 2 days   Hollice Espy 12/12/2019, 3:36 PM  We will stay till Monday for pain control and transportation.

## 2019-12-13 LAB — BASIC METABOLIC PANEL
Anion gap: 10 (ref 5–15)
BUN: 28 mg/dL — ABNORMAL HIGH (ref 8–23)
CO2: 27 mmol/L (ref 22–32)
Calcium: 8.1 mg/dL — ABNORMAL LOW (ref 8.9–10.3)
Chloride: 99 mmol/L (ref 98–111)
Creatinine, Ser: 2.34 mg/dL — ABNORMAL HIGH (ref 0.61–1.24)
GFR calc Af Amer: 32 mL/min — ABNORMAL LOW (ref >60)
GFR calc non Af Amer: 28 mL/min — ABNORMAL LOW (ref >60)
Glucose, Bld: 96 mg/dL (ref 70–99)
Potassium: 3.6 mmol/L (ref 3.5–5.1)
Sodium: 136 mmol/L (ref 135–145)

## 2019-12-13 LAB — GLUCOSE, CAPILLARY
Glucose-Capillary: 81 mg/dL (ref 70–99)
Glucose-Capillary: 145 mg/dL — ABNORMAL HIGH (ref 70–99)
Glucose-Capillary: 119 mg/dL — ABNORMAL HIGH (ref 70–99)
Glucose-Capillary: 165 mg/dL — ABNORMAL HIGH (ref 70–99)

## 2019-12-13 LAB — HEMOGLOBIN AND HEMATOCRIT, BLOOD
HCT: 27.6 % — ABNORMAL LOW (ref 39.0–52.0)
Hemoglobin: 8.8 g/dL — ABNORMAL LOW (ref 13.0–17.0)

## 2019-12-13 MED ORDER — BISACODYL 10 MG RE SUPP
10.0000 mg | Freq: Once | RECTAL | Status: AC
  Administered 2019-12-13: 10 mg via RECTAL
  Filled 2019-12-13: qty 1

## 2019-12-13 MED ORDER — FLEET ENEMA 7-19 GM/118ML RE ENEM
1.0000 | ENEMA | Freq: Once | RECTAL | Status: AC
  Administered 2019-12-13: 1 via RECTAL

## 2019-12-13 NOTE — TOC Progression Note (Signed)
Transition of Care [facility]) - Progression Note    Patient Details  Name: [name] MRN: [id number] Date of Birth: May 10, 1951  Transition of [facility]) CM/SW Contact  Zigmund Daniel Dorian Pod, RN [place] 12/13/2019, 4:58 PM  Clinical Narrative:     RN has communicated with Dr. Erlene Quan and bedside RN. Discharge packet provided with letter of necessity for discharge today. All parties aware last pick up is 5:30pm by [facility].     Expected Discharge Plan: Home/Self Care Barriers to Discharge: Continued Medical Work up  Expected Discharge Plan and Services Expected Discharge Plan: Home/Self Care     Post Acute Care Choice: NA Living arrangements for the past 2 months: Single Family Home                                       Social Determinants of Health (SDOH) Interventions    Readmission Risk Interventions Readmission Risk Prevention Plan 12/11/2019  Transportation Screening Complete  PCP or Specialist Appt within 3-5 Days Complete  Social Work Consult for [place] Planning/Counseling Complete  Palliative Care Screening Not Applicable  Medication Review Press photographer) Complete  Some recent data might be hidden

## 2019-12-13 NOTE — Progress Notes (Signed)
3 Days Post-Op Subjective: Pain significantly improved this a.m.  Ambulating around room to bathroom.  Passing flatus but still no bowel movement.  Tolerating diet.  Labs stable, Eliquis resumed.  Objective: Vital signs in last 24 hours: Temp:  [98.1 F (36.7 C)-98.3 F (36.8 C)] 98.1 F (36.7 C) (07/11 1245) Pulse Rate:  [66-79] 79 (07/11 1245) Resp:  [18-20] 18 (07/11 1245) BP: (103-129)/(52-58) 129/58 (07/11 1245) SpO2:  [94 %-96 %] 94 % (07/11 1245)  Intake/Output from previous day: 07/10 0701 - 07/11 0700 In: 720 [P.O.:720] Out: 1825 [Urine:1825] Intake/Output this shift: Total I/O In: 420 [P.O.:420] Out: 550 [Urine:550]  Physical Exam:  General: Alert and oriented. Lungs: No increased work of breathing. GI: Soft, Nondistended. Incisions: Clean and dry.   Minimal bruising.  Intact. Extremities: Minimal lower extremity edema, sock impressions.  No calf tenderness.  Lab Results: Recent Labs    12/11/19 0531 12/12/19 0735 12/13/19 0735  HGB 9.0* 8.7* 8.8*  HCT 28.3* 27.3* 27.6*          Recent Labs    12/11/19 0531 12/12/19 0735 12/13/19 0735  CREATININE 2.03* 2.38* 2.34*           Results for orders placed or performed during the hospital encounter of 12/10/19 (from the past 24 hour(s))  Glucose, capillary     Status: Abnormal   Collection Time: 12/12/19  4:45 PM  Result Value Ref Range   Glucose-Capillary 114 (H) 70 - 99 mg/dL  Glucose, capillary     Status: Abnormal   Collection Time: 12/12/19  9:15 PM  Result Value Ref Range   Glucose-Capillary 168 (H) 70 - 99 mg/dL  Hemoglobin and hematocrit, blood     Status: Abnormal   Collection Time: 12/13/19  7:35 AM  Result Value Ref Range   Hemoglobin 8.8 (L) 13.0 - 17.0 g/dL   HCT 27.6 (L) 39 - 52 %  Basic metabolic panel     Status: Abnormal   Collection Time: 12/13/19  7:35 AM  Result Value Ref Range   Sodium 136 135 - 145 mmol/L   Potassium 3.6 3.5 - 5.1 mmol/L   Chloride 99 98 - 111 mmol/L   CO2  27 22 - 32 mmol/L   Glucose, Bld 96 70 - 99 mg/dL   BUN 28 (H) 8 - 23 mg/dL   Creatinine, Ser 2.34 (H) 0.61 - 1.24 mg/dL   Calcium 8.1 (L) 8.9 - 10.3 mg/dL   GFR calc non Af Amer 28 (L) >60 mL/min   GFR calc Af Amer 32 (L) >60 mL/min   Anion gap 10 5 - 15  Glucose, capillary     Status: None   Collection Time: 12/13/19  7:47 AM  Result Value Ref Range   Glucose-Capillary 81 70 - 99 mg/dL   Comment 1 Notify RN   Glucose, capillary     Status: Abnormal   Collection Time: 12/13/19 12:28 PM  Result Value Ref Range   Glucose-Capillary 145 (H) 70 - 99 mg/dL   Comment 1 Notify RN     Assessment/Plan: POD# 3 s/p robotic left nephrectomy.  1) Ambulate, Incentive spirometry 2) Dulcolax suppository to stimulate BM, consider enema if this fails 3) discharge planning   Hollice Espy, MD   LOS: 3 days   Hollice Espy 12/13/2019, 3:51 PM

## 2019-12-14 DIAGNOSIS — N261 Atrophy of kidney (terminal): Secondary | ICD-10-CM

## 2019-12-14 LAB — GLUCOSE, CAPILLARY: Glucose-Capillary: 51 mg/dL — ABNORMAL LOW (ref 70–99)

## 2019-12-14 MED ORDER — OXYCODONE-ACETAMINOPHEN 5-325 MG PO TABS: 1.0000 | ORAL_TABLET | Freq: Four times a day (QID) | ORAL | 0 refills | Status: DC | PRN

## 2019-12-14 NOTE — Progress Notes (Signed)
Inpatient Diabetes Program Recommendations  AACE/ADA: New Consensus Statement on Inpatient Glycemic Control  Target Ranges:  Prepandial:   less than 140 mg/dL      Peak postprandial:   less than 180 mg/dL (1-2 hours)      Critically ill patients:  140 - 180 mg/dL   Results for [name], [name] (MRN [id number]) as of 12/14/2019 08:43  Ref. Range 12/13/2019 07:47 12/13/2019 12:28 12/13/2019 16:54 12/13/2019 21:18 12/14/2019 07:52  Glucose-Capillary Latest Ref Range: 70 - 99 mg/dL 81 145 (H) 119 (H) 165 (H) 51 (L)  Results for [name], [name] (MRN [id number]) as of 12/14/2019 08:43  Ref. Range 12/12/2019 07:55 12/12/2019 08:36 12/12/2019 11:51 12/12/2019 16:45 12/12/2019 21:15  Glucose-Capillary Latest Ref Range: 70 - 99 mg/dL 41 (LL) 87 104 (H) 114 (H) 168 (H)   Review of Glycemic Control  Diabetes history: DM2 Outpatient Diabetes medications: Glipizide 10 mg BID, Jardiance 10 mg QPM, Toujeo 40 units QAM, Ozempic 1 mg Qweek (Monday) Current orders for Inpatient glycemic control: Lantus 40 units QAM, Jardiance 10 mg QPM, Glipizide 10 mg BID  Inpatient Diabetes Program Recommendations:    Oral DM medications: While inpatient, please discontinue Jardiance and Glipizide.  Insulin-Basal: Please consider decreasing Lantus to 30 units QAM to start 12/15/19 since patient has already received Lantus 40 units this morning.  Insulin-Correction: Please consider ordering Novolog 0-9 units TID with meals and Novolog 0-5 units QHS.  Thanks, Barnie Alderman, RN, MSN, CDE Diabetes Coordinator Inpatient Diabetes Program [phone] (Team Pager from 8am to 5pm)

## 2019-12-14 NOTE — Progress Notes (Signed)
Urology Inpatient Progress Note  Subjective: Hemoglobin and creatinine stable yesterday, 8.8 and 2.34, respectively. UOP 2475 mL over the past 24 hours. Patient tolerating p.o. without nausea or vomiting. First postop BM yesterday. Patient reports intermittent abdominal pain. He has been ambulating, refusing home PT at discharge.  Anti-infectives: Anti-infectives (From admission, onward)   Start     Dose/Rate Route Frequency Ordered Stop   12/10/19 1600  ceFAZolin (ANCEF) IVPB 1 g/50 mL premix        1 g 100 mL/hr over 30 Minutes Intravenous Every 8 hours 12/10/19 1358 12/10/19 2346   12/10/19 0623  ceFAZolin (ANCEF) 2-4 GM/100ML-% IVPB       Note to Pharmacy: Myles Lipps   : cabinet override      12/10/19 0623 12/10/19 0829   12/10/19 0604  ceFAZolin (ANCEF) IVPB 2g/100 mL premix        2 g 200 mL/hr over 30 Minutes Intravenous 30 min pre-op 12/10/19 0604 12/10/19 0857      Current Facility-Administered Medications  Medication Dose Route Frequency Provider Last Rate Last Admin  . acetaminophen (TYLENOL) tablet 650 mg  650 mg Oral Q4H PRN Hollice Espy, MD      . amiodarone (PACERONE) tablet 200 mg  200 mg Oral Daily Hollice Espy, MD   200 mg at 12/13/19 0935  . apixaban (ELIQUIS) tablet 5 mg  5 mg Oral BID Hollice Espy, MD   5 mg at 12/13/19 2115  . baclofen (LIORESAL) tablet 10 mg  10 mg Oral QPM Hollice Espy, MD   10 mg at 12/13/19 1856  . carvedilol (COREG) tablet 6.25 mg  6.25 mg Oral BID WC Hollice Espy, MD   6.25 mg at 12/12/19 1803  . clonazePAM (KLONOPIN) tablet 1 mg  1 mg Oral QHS Hollice Espy, MD   1 mg at 12/13/19 2115  . diphenhydrAMINE (BENADRYL) injection 12.5 mg  12.5 mg Intravenous Q6H PRN Hollice Espy, MD       Or  . diphenhydrAMINE (BENADRYL) 12.5 MG/5ML elixir 12.5 mg  12.5 mg Oral Q6H PRN Hollice Espy, MD      . docusate sodium (COLACE) capsule 100 mg  100 mg Oral BID Hollice Espy, MD   100 mg at 12/13/19 2115  . empagliflozin  (JARDIANCE) tablet 10 mg  10 mg Oral QPM Hollice Espy, MD      . glipiZIDE (GLUCOTROL) tablet 10 mg  10 mg Oral BID Hollice Espy, MD   10 mg at 12/13/19 2123  . insulin glargine (LANTUS) injection 40 Units  40 Units Subcutaneous Maryclare Labrador, MD   40 Units at 12/14/19 [id number]  . montelukast (SINGULAIR) tablet 10 mg  10 mg Oral QHS Hollice Espy, MD   10 mg at 12/13/19 2115  . morphine 2 MG/ML injection 2-4 mg  2-4 mg Intravenous Q2H PRN Hollice Espy, MD   2 mg at 12/10/19 1559  . ondansetron (ZOFRAN) injection 4 mg  4 mg Intravenous Q4H PRN Hollice Espy, MD      . oxybutynin (DITROPAN) tablet 5 mg  5 mg Oral Q8H PRN Hollice Espy, MD   5 mg at 12/11/19 1238  . oxyCODONE-acetaminophen (PERCOCET/ROXICET) 5-325 MG per tablet 1-2 tablet  1-2 tablet Oral Q4H PRN Hollice Espy, MD   2 tablet at 12/13/19 1856  . rosuvastatin (CRESTOR) tablet 20 mg  20 mg Oral QPM Hollice Espy, MD   20 mg at 12/13/19 2115  . torsemide (DEMADEX) tablet 20 mg  20 mg Oral BID Erlene Quan,  Caryl Pina, MD   20 mg at 12/13/19 1856   Objective: Vital signs in last 24 hours: Temp:  [97.8 F (36.6 C)-99.3 F (37.4 C)] 98 F (36.7 C) (07/12 0826) Pulse Rate:  [74-79] 74 (07/12 0826) Resp:  [18-20] 19 (07/12 0826) BP: (124-131)/(58-77) 131/64 (07/12 0826) SpO2:  [94 %-100 %] 98 % (07/12 0826)  Intake/Output from previous day: 07/11 0701 - 07/12 0700 In: 420 [P.O.:420] Out: 2475 [Urine:2475] Intake/Output this shift: No intake/output data recorded.  Physical Exam Vitals and nursing note reviewed.  Constitutional:      General: [name] not in acute distress.    Appearance: [name] not ill-appearing, toxic-appearing or diaphoretic.  HENT:     Head: Normocephalic and atraumatic.  Pulmonary:     Effort: Pulmonary effort is normal. No respiratory distress.  Abdominal:     Comments: Surgical incisions visualized over the anterior abdomen, all clean, dry, and intact with overlying surgical adhesive.   Minimal postoperative bruising noted.  No flank ecchymosis.  Site of prior left nephrostomy tube has closed.  Skin:    General: Skin is warm and dry.  Neurological:     Mental Status: [name] alert and oriented to person, place, and time.  Psychiatric:        Mood and Affect: Mood normal.        Behavior: Behavior normal.    Lab Results:  Recent Labs    12/12/19 0735 12/13/19 0735  WBC 8.8  --   HGB 8.7* 8.8*  HCT 27.3* 27.6*  PLT 180  --    BMET Recent Labs    12/12/19 0735 12/13/19 0735  NA 138 136  K 3.6 3.6  CL 101 99  CO2 28 27  GLUCOSE 61* 96  BUN 26* 28*  CREATININE 2.38* 2.34*  CALCIUM 8.0* 8.1*   Assessment & Plan: 69 year old comorbid male with extensive urologic history including severe chronic left renal atrophy, left hydronephrosis, CKD, and recurrent left nephrolithiasis now POD4 from left robotic simple nephrectomy with Dr. Erlene Quan.  Hospitalization prolonged due to transportation.    Patient recovering well, pain adequately controlled, has had BM.  Transportation has been arranged for this morning.  We will plan for discharge to home without home health PT this morning.  Debroah Loop, PA-C 12/14/2019

## 2019-12-14 NOTE — Care Management Important Message (Signed)
Important Message  Patient Details  Name: [name] MRN: [id number] Date of Birth: Dec 29, 1950   Medicare Important Message Given:  No  Patient discharged prior to arrival to unit to deliver concurrent Medicare IM.  Initial given by Patient Access Associate on 12/11/2019 at 10:40am.   Dannette Barbara 12/14/2019, 12:10 PM

## 2019-12-14 NOTE — Progress Notes (Signed)
MD ordered patient to be discharged home.  Discharge instructions were reviewed with the patient and he voiced understanding.  Follow-up appointments were made.  Prescriptions sent to the patients pharmacy.  IV was removed with catheter intact.  All patients questions were answered.  Patient left via wheelchair escorted by NT.

## 2019-12-14 NOTE — Discharge Summary (Signed)
Date of admission: 12/10/2019  Date of discharge: 12/14/2019  Admission diagnosis: Atrophic left kidney, left urolithiasis, recurrent pyelonephritis  Discharge diagnosis: Same as above  Secondary diagnoses:  Patient Active Problem List   Diagnosis Date Noted  . Left nephrolithiasis 12/10/2019  . Acute pyelonephritis 11/12/2019  . Chronic diastolic CHF (congestive heart failure) ([place]) 05/12/2018  . Chronic heart failure with preserved ejection fraction (HFpEF) ([place]) 12/05/2017  . NICM (nonischemic cardiomyopathy) ([place]) 04/18/2017  . Non-rheumatic mitral regurgitation 04/18/2017  . Chronic kidney disease, stage III (moderate) 04/18/2017  . Type 2 diabetes mellitus with diabetic polyneuropathy, without long-term current use of insulin ([place]) 02/27/2017  . Hyperlipidemia due to type 2 diabetes mellitus ([place]) 02/27/2017  . Left ureteral stone 10/25/2015  . Coronary artery disease involving native coronary artery of native heart without angina pectoris 09/22/2015  . First degree AV block 09/22/2015  . Hypertension associated with diabetes ([place]) 09/22/2015  . Hyperlipidemia LDL goal <70 09/22/2015  . Hypotension 08/09/2015  . Hydronephrosis with renal and ureteral calculus obstruction   . Kidney stone on left side   . OSA (obstructive sleep apnea)   . Hyponatremia 07/13/2015  . Uncontrolled type 2 diabetes mellitus ([place]) 07/13/2015  . Pulmonary hypertension ([place])   . Cardiorenal syndrome   . Morbid obesity due to excess calories ([place])   . Paroxysmal atrial fibrillation ([facility])     History and Physical: For full details, please see admission history and physical. Briefly, [name] is a 69 y.o. year old patient admitted on 12/10/2019 for scheduled left robotic simple nephrectomy with Dr. Erlene Quan.   Hospital Course: Patient tolerated the procedure well.  He was then transferred to the floor after an uneventful PACU stay.  His hospital course was uncomplicated but prolonged due to  difficulty arranging transportation.  On POD#4 he had met discharge criteria: was eating a regular diet, was up and ambulating,  pain was well controlled, was voiding without a catheter, and was ready for discharge.  Patient was recommended to undergo home health PT on discharge, however he refused this.  Laboratory values:  Recent Labs    12/12/19 0735 12/13/19 0735  WBC 8.8  --   HGB 8.7* 8.8*  HCT 27.3* 27.6*   Recent Labs    12/12/19 0735 12/13/19 0735  NA 138 136  K 3.6 3.6  CL 101 99  CO2 28 27  GLUCOSE 61* 96  BUN 26* 28*  CREATININE 2.38* 2.34*  CALCIUM 8.0* 8.1*   Results for orders placed or performed during the hospital encounter of 12/08/19  SARS CORONAVIRUS 2 (TAT 6-24 HRS) Nasopharyngeal Nasopharyngeal Swab     Status: None   Collection Time: 12/08/19 11:54 AM   Specimen: Nasopharyngeal Swab  Result Value Ref Range Status   SARS Coronavirus 2 NEGATIVE NEGATIVE Final    Comment: (NOTE) SARS-CoV-2 target nucleic acids are NOT DETECTED.  The SARS-CoV-2 RNA is generally detectable in upper and lower respiratory specimens during the acute phase of infection. Negative results do not preclude SARS-CoV-2 infection, do not rule out co-infections with other pathogens, and should not be used as the sole basis for treatment or other patient management decisions. Negative results must be combined with clinical observations, patient history, and epidemiological information. The expected result is Negative.  Fact Sheet for Patients: SugarRoll.be  Fact Sheet for Healthcare Providers: https://www.woods-mathews.com/  This test is not yet approved or cleared by the [place] FDA and  has been authorized for detection and/or diagnosis of SARS-CoV-2 by  FDA under an Emergency Use Authorization (EUA). This EUA will remain  in effect (meaning this test can be used) for the duration of the COVID-19 declaration under Se ction  564(b)(1) of the Act, 21 U.S.C. section 360bbb-3(b)(1), unless the authorization is terminated or revoked sooner.  Performed at [facility], [place] [address] [place], [place] [address]     Disposition: Home  Discharge instruction: The patient was instructed to be ambulatory but told to refrain from heavy lifting, strenuous activity, or driving.   Discharge medications:  Allergies as of 12/14/2019      Reactions   Other Hives   Blue cheese      Medication List    STOP taking these medications   HYDROcodone-acetaminophen 5-325 MG tablet Commonly known as: NORCO/VICODIN   oxybutynin 5 MG tablet Commonly known as: DITROPAN   tamsulosin 0.4 MG Caps capsule Commonly known as: FLOMAX     TAKE these medications   amiodarone 200 MG tablet Commonly known as: PACERONE Take 1 tablet (200 mg total) by mouth daily.   apixaban 5 MG Tabs tablet Commonly known as: Eliquis Take 1 tablet (5 mg total) 2 (two) times daily by mouth.   b complex vitamins tablet Take 1 tablet by mouth every evening.   baclofen 10 MG tablet Commonly known as: LIORESAL Take 10 mg by mouth every evening.   carvedilol 6.25 MG tablet Commonly known as: COREG TAKE 1 TABLET BY MOUTH TWICE DAILY   clonazePAM 1 MG tablet Commonly known as: KLONOPIN Take 1 mg by mouth at bedtime.   CoQ10 400 MG Caps Take 400 mg by mouth 2 (two) times daily.   dextromethorphan-guaiFENesin 30-600 MG 12hr tablet Commonly known as: MUCINEX DM Take 1 tablet by mouth 2 (two) times daily.   docusate sodium 100 MG capsule Commonly known as: COLACE Take 1 capsule (100 mg total) by mouth 2 (two) times daily.   glipiZIDE 10 MG tablet Commonly known as: GLUCOTROL Take 10 mg by mouth 2 (two) times daily.   Hawthorne Berry 550 MG Caps Take 550 mg by mouth every evening.   Jardiance 10 MG Tabs tablet Generic drug: empagliflozin Take 10 mg by mouth every evening.   magnesium oxide 400 MG tablet Commonly known as:  MAG-OX Take 400 mg by mouth every morning.   montelukast 10 MG tablet Commonly known as: SINGULAIR Take 10 mg by mouth at bedtime.   oxyCODONE-acetaminophen 5-325 MG tablet Commonly known as: PERCOCET/ROXICET Take 1-2 tablets by mouth every 6 (six) hours as needed for moderate pain.   Ozempic (1 MG/DOSE) 2 MG/1.5ML Sopn Generic drug: Semaglutide (1 MG/DOSE) Inject 1 mg into the skin every Monday. In the evening.   polyethylene glycol 17 g packet Commonly known as: MIRALAX / GLYCOLAX Take 17 g by mouth daily as needed for mild constipation.   PRESERVISION AREDS 2 PO Take 1 tablet by mouth 2 (two) times daily.   rosuvastatin 20 MG tablet Commonly known as: CRESTOR Take 20 mg by mouth every evening.   Systane 0.4-0.3 % Soln Generic drug: Polyethyl Glycol-Propyl Glycol Place 1 drop into both eyes 3 (three) times daily as needed (dry/irritated eyes.).   torsemide 20 MG tablet Commonly known as: DEMADEX Take 20 mg by mouth 2 (two) times daily.   Toujeo Max SoloStar 300 UNIT/ML Solostar Pen Generic drug: insulin glargine (2 Unit Dial) Inject 40 Units into the skin every morning. Titrate up to 60 units/day, per endocrinology instructions.   vitamin C 1000 MG  tablet Take 1,000 mg by mouth daily.   Vitamin D (Ergocalciferol) 1.25 MG (50000 UNIT) Caps capsule Commonly known as: DRISDOL Take 50,000 Units by mouth every Monday.       Followup: Postop follow-up as below. Future Appointments  Date Time Provider Reform  01/08/2020  1:30 PM Hollice Espy, MD [facility] None     Follow-up Information    Vidal Schwalbe, MD Follow up.   Specialty: Family Medicine Why: Please schedule hospital follow up appt within 5-7 days of discharge. Contact information: [address] [place] [address] [phone]        Nelva Bush, MD .   Specialty: Cardiology Contact information: [place] [address] [phone]

## 2019-12-14 NOTE — TOC Transition Note (Signed)
Transition of Care [facility]) - CM/SW Discharge Note   Patient Details  Name: [name] MRN: [id number] Date of Birth: [birth year]  Transition of Care [facility] CM/SW Contact:  Candie Chroman, LCSW Phone Number: 12/14/2019, 9:33 AM   Clinical Narrative:  Patient has orders to discharge home today. He has transportation home through [employer]. Patient not interested in [place]. No further concerns. CSW signing off.  Final next level of care: Home/Self Care Barriers to Discharge: Barriers Resolved   Patient Goals and CMS Choice        Discharge Placement                Patient to be transferred to facility by: [facility]   Patient and family notified of of transfer: 12/14/19  Discharge Plan and Services     Post Acute Care Choice: NA                               Social Determinants of Health (SDOH) Interventions     Readmission Risk Interventions Readmission Risk Prevention Plan 12/11/2019  Transportation Screening Complete  PCP or Specialist Appt within 3-5 Days Complete  Social Work Consult for [place] Planning/Counseling [place] Not Applicable  Medication Review Press photographer) Complete  Some recent data might be hidden

## 2019-12-15 LAB — SURGICAL PATHOLOGY

## 2019-12-17 NOTE — Anesthesia Postprocedure Evaluation (Signed)
Anesthesia Post Note  Patient: [name]  Procedure(s) Performed: XI ROBOTIC ASSISTED LAPAROSCOPIC NEPHRECTOMY (Left )  Patient location during evaluation: PACU Anesthesia Type: General Level of consciousness: awake and alert Pain management: pain level controlled Vital Signs Assessment: post-procedure vital signs reviewed and stable Respiratory status: spontaneous breathing, nonlabored ventilation, respiratory function stable and patient connected to nasal cannula oxygen Cardiovascular status: blood pressure returned to baseline and stable Postop Assessment: no apparent nausea or vomiting Anesthetic complications: no   No complications documented.   Last Vitals:  Vitals:   12/14/19 0455 12/14/19 0826  BP: 124/69 131/64  Pulse: 75 74  Resp: 20 19  Temp: 36.6 C 36.7 C  SpO2: 100% 98%    Last Pain:  Vitals:   12/14/19 0826  TempSrc: Oral  PainSc:                  [name]

## 2019-12-25 ENCOUNTER — Ambulatory Visit: Payer: Medicare Other | Admitting: Urology

## 2019-12-28 ENCOUNTER — Other Ambulatory Visit: Payer: Self-pay | Admitting: Physician Assistant

## 2019-12-28 MED ORDER — OXYBUTYNIN CHLORIDE 5 MG PO TABS
5.0000 mg | ORAL_TABLET | Freq: Three times a day (TID) | ORAL | 1 refills | Status: DC | PRN
Start: 2019-12-28 — End: 2020-07-08

## 2020-01-01 ENCOUNTER — Encounter: Payer: Self-pay | Admitting: Urology

## 2020-01-06 ENCOUNTER — Ambulatory Visit: Payer: BLUE CROSS/BLUE SHIELD | Admitting: Internal Medicine

## 2020-01-07 NOTE — Progress Notes (Signed)
Virtual Visit via Video Note  I connected with [name] on 01/08/2020 at  1:30 PM EDT by a video enabled telemedicine application and verified that I am speaking with the correct person using two identifiers.  Location: Patient: Home Provider: Office   I discussed the limitations of evaluation and management by telemedicine and the availability of in person appointments. The patient expressed understanding and agreed to proceed.  History of Present Illness: [name] is a 69 y.o. male with a history of severe chronic left hydronephrosis, left renal atrophy, nephrolithiasis and CKD who returns today follow up 4 weeks s/p left robotic simple nephrectomy 12/10/19. He was unable to get a ride today this this visit is being conducted via telephone visit. [name] rescheduled his in person visit for 2 weeks.  CT Renal stone study from 11/12/19 revealed interval migration of a 15 mm calculus into the distal left ureter with significant hydronephrosis identified. Perinephric stranding is noted as well suspicious for underlying superimposed infection. Multiple large calculi within the left kidney the largest of which measures 4.3 cm in greatest dimension.  He was admitted to the hospitalist service for  IV antibiotics and supportive care.  He continues to have significant pain.  He ultimately needs nephrostomy tube placement..  All urine cultures include were negative.  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 There was abscess and soft tissue necrosis, periureteral/perinephric. Negative for malignancy.   Today he reports that his incision are healing well. He has some pain at the incision site for which he takes tylenol. He reported a few bladder spasms and as placed on oxybutynin per [employer], PA-C. Bladder spasms have tapered off with medication.   Overall, he is feeling great. The incisions are healing well. [name] no longer requiring  narcotic.  His blood sugar is running about 60-70 hypoglycemic range. He feels like his medications need to be adjusted again. The morning his blood sugar was 81. He has an upcoming appointment with his endocrinologist Dr. Honor Love.      Observations/Objective: The patient is engaged and asking good questions.   Assessment and Plan:  1. Flank pain w/ hx of urolithiasis s/p left robotic simple nephrectomy 12/10/19.  Incisions are healing well.  We will plan to have CBC BMP drawn at his primary care's office prior to this upcoming appointment  2. Left renal atrophy/hydronephrosis Severe, chronic   3. CKD stage 3 Recent Cr is 2.34 as of 12/13/19  Follow Up Instructions: RTC in 2 weeks.   I discussed the assessment and treatment plan with the patient. The patient was provided an opportunity to ask questions and all were answered. The patient agreed with the plan and demonstrated an understanding of the instructions.   The patient was advised to call back or seek an in-person evaluation if the symptoms worsen or if the condition fails to improve as anticipated.  I provided 20 minutes of non-face-to-face time during this encounter.   Fransico Him, am acting as a scribe for Dr. Hollice Espy.  I have reviewed the above documentation for accuracy and completeness, and I agree with the above.   Hollice Espy, MD

## 2020-01-08 ENCOUNTER — Telehealth (INDEPENDENT_AMBULATORY_CARE_PROVIDER_SITE_OTHER): Payer: Medicare Other | Admitting: Urology

## 2020-01-08 ENCOUNTER — Other Ambulatory Visit: Payer: Self-pay

## 2020-01-08 DIAGNOSIS — N1330 Unspecified hydronephrosis: Secondary | ICD-10-CM

## 2020-01-08 DIAGNOSIS — N200 Calculus of kidney: Secondary | ICD-10-CM

## 2020-01-08 DIAGNOSIS — N261 Atrophy of kidney (terminal): Secondary | ICD-10-CM

## 2020-01-12 ENCOUNTER — Telehealth: Payer: Self-pay | Admitting: *Deleted

## 2020-01-12 NOTE — Telephone Encounter (Addendum)
Orders placed, faxed to Dr. Arliss Journey office [phone].  ----- Message from Hollice Espy, MD sent at 01/08/2020  2:22 PM EDT ----- Can you please fax an order to this patient's primary care Dr. Bartolo Darter for him to draw a BMP CBC and have these labs faxed back to [place]?

## 2020-01-12 NOTE — Addendum Note (Signed)
Addended by: Verlene Mayer A on: 01/12/2020 09:35 AM   Modules accepted: Orders

## 2020-01-14 ENCOUNTER — Encounter: Payer: Self-pay | Admitting: Urology

## 2020-01-19 ENCOUNTER — Other Ambulatory Visit: Payer: Self-pay | Admitting: Emergency Medicine

## 2020-01-21 NOTE — Progress Notes (Signed)
01/22/2020 3:04 PM   [name] 10/26/1950 [id number]  Referring provider: Vidal Schwalbe, MD [address] [place],  [place] [address] Chief Complaint  Patient presents with  . Follow-up    wound check    HPI: [name] is a 69 y.o. male with a history of severe chronic left hydronephrosis, left renal atrophy, nephrolithiasis and CKD. He returns today for follow up and a wound check.  CT Renal stone study from 11/12/19 revealed interval migration of a 15 mm calculus into the distal left ureter with significant hydronephrosis identified. Perinephric stranding is noted as well suspicious for underlying superimposed infection. Multiple large calculi within the left kidney the largest of which measures 4.3 cm in greatest dimension.  Patient is s/p left robotic simple nephrectomy 12/10/19. 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 There was abscess and soft tissue necrosis, periureteral/perinephric. Negative for malignancy.   Last visit he noted his incisions were healing well. He reported a few bladder spasms and as placed on oxybutynin per [employer], PA-C. Bladder spasms had tapered off with medication.   Today he reports having low energy. He reports irritation with urination. Denies hematuria. Denies pain.   He reports his blood sugar has been running low. He notes his blood sugar is in the hypoglycemic range.    PMH: Past Medical History:  Diagnosis Date  . CKD (chronic kidney disease) stage 3, GFR 30-59 ml/min   . Coronary artery disease    a. 1998 s/p ACS Multi-link BMS to the prox LAD ([place], [place]); b. 07/2015 Cath: LM nl, LAD patent stent, LCX min irregs, OM1/2 min irregs, OM3 nl, RCA min irregs.  . Diabetes mellitus type 2, uncontrolled ([place])    a. A1c 11.0 in 07/2015.  [name] HFimpEF (heart failure with improved ejection fraction) ([place])    a. EF 25% by cath in 2013; b. Echo in 07/2015 showing EF of 15-20%, moderate MR,  moderate Pulm HTN, severely dilated IVC; c. 11/2016 Echo: EF 55-60%, no rwma, Gr1 DD, mildly dil LA, nl RV fxn.  [name] History of kidney stones   . Hypertension   . Ischemic cardiomyopathy   . Kidney stones    Left  . Paroxysmal atrial fibrillation ([place])    a. Dx 07/2015; b. CHA2DS2VASc = 5-->eliquis. Rhythm control w/ amiodarone.  . Pollen allergies 09-21-15   pt called and stated that he woke up and had some drainage-pt states he did not see the color of the drainage-pt denies running a fever and this only happened once-pt instructed to call Dr Audree Bane office if he starts running a fever or if the color of drainage becomes yellow/green  . Psoriasis   . Pulmonary hypertension ([place])   . Renal disorder    kidney stone  . Sleep apnea     Surgical History: Past Surgical History:  Procedure Laterality Date  . CARDIAC CATHETERIZATION N/A 07/25/2015   Procedure: Right/Left Heart Cath and Coronary Angiography;  Surgeon: Wellington Hampshire, MD;  Location: [facility];  Service: Cardiovascular;  Laterality: N/A;  . CARDIAC CATHETERIZATION     [place]  . CARDIAC CATHETERIZATION     [facility] [place]    . CYSTOSCOPY WITH STENT PLACEMENT Left 09/07/2015   Procedure: CYSTOSCOPY WITH STENT PLACEMENT;  Surgeon: Hollice Espy, MD;  Location: [facility];  Service: Urology;  Laterality: Left;  . CYSTOSCOPY/URETEROSCOPY/HOLMIUM LASER/STENT PLACEMENT Left 10/25/2015   Procedure: CYSTOSCOPY/URETEROSCOPY/HOLMIUM LASER/STENT EXCHANGE;  Surgeon: Hollice Espy, MD;  Location: [facility];  Service: Urology;  Laterality: Left;  . CYSTOSCOPY/URETEROSCOPY/HOLMIUM LASER/STENT PLACEMENT Left 05/11/2019   Procedure: CYSTOSCOPY/URETEROSCOPY/HOLMIUM LASER/STENT PLACEMENT;  Surgeon: Hollice Espy, MD;  Location: [facility];  Service: Urology;  Laterality: Left;  . EXTRACORPOREAL SHOCK WAVE LITHOTRIPSY Left 04/02/2019   Procedure: EXTRACORPOREAL SHOCK WAVE LITHOTRIPSY  (ESWL);  Surgeon: Hollice Espy, MD;  Location: [facility];  Service: Urology;  Laterality: Left;  . EXTRACORPOREAL SHOCK WAVE LITHOTRIPSY Left 03/05/2019   Procedure: EXTRACORPOREAL SHOCK WAVE LITHOTRIPSY (ESWL);  Surgeon: Abbie Sons, MD;  Location: [facility];  Service: Urology;  Laterality: Left;  . EYE SURGERY Bilateral    LASER FOR GLAUCOMA  . KIDNEY SURGERY    . NEPHROSTOMY TUBE PLACEMENT ([place] HX) Left 11/2019  . ROBOT ASSISTED LAPAROSCOPIC NEPHRECTOMY Left 12/10/2019   Procedure: XI ROBOTIC ASSISTED LAPAROSCOPIC NEPHRECTOMY;  Surgeon: Hollice Espy, MD;  Location: [facility];  Service: Urology;  Laterality: Left;  . URETEROSCOPY WITH HOLMIUM LASER LITHOTRIPSY Left 09/07/2015   Procedure: URETEROSCOPY WITH HOLMIUM LASER LITHOTRIPSY;  Surgeon: Hollice Espy, MD;  Location: [facility];  Service: Urology;  Laterality: Left;    Home Medications:  Allergies as of 01/22/2020      Reactions   Other Hives   Blue cheese      Medication List       Accurate as of January 22, 2020 11:59 PM. If you have any questions, ask your nurse or doctor.        amiodarone 200 MG tablet Commonly known as: PACERONE Take 1 tablet (200 mg total) by mouth daily.   apixaban 5 MG Tabs tablet Commonly known as: Eliquis Take 1 tablet (5 mg total) 2 (two) times daily by mouth.   baclofen 10 MG tablet Commonly known as: LIORESAL Take 10 mg by mouth every evening.   carvedilol 6.25 MG tablet Commonly known as: COREG TAKE 1 TABLET BY MOUTH TWICE DAILY   clonazePAM 1 MG tablet Commonly known as: KLONOPIN Take 1 mg by mouth at bedtime.   CoQ10 400 MG Caps Take 400 mg by mouth 2 (two) times daily.   dextromethorphan-guaiFENesin 30-600 MG 12hr tablet Commonly known as: MUCINEX DM Take 1 tablet by mouth 2 (two) times daily.   docusate sodium 100 MG capsule Commonly known as: COLACE Take 1 capsule (100 mg total) by mouth 2 (two) times daily.   FeroSul 325 (65 FE) MG tablet Generic drug: ferrous  sulfate Take 325 mg by mouth 2 (two) times daily.   glipiZIDE 10 MG tablet Commonly known as: GLUCOTROL Take 10 mg by mouth 2 (two) times daily.   Hawthorne Berry 550 MG Caps Take 550 mg by mouth every evening.   Jardiance 10 MG Tabs tablet Generic drug: empagliflozin Take 10 mg by mouth every evening.   magnesium oxide 400 MG tablet Commonly known as: MAG-OX Take 400 mg by mouth every morning.   montelukast 10 MG tablet Commonly known as: SINGULAIR Take 10 mg by mouth at bedtime.   oxybutynin 5 MG tablet Commonly known as: DITROPAN Take 1 tablet (5 mg total) by mouth every 8 (eight) hours as needed for bladder spasms.   oxyCODONE-acetaminophen 5-325 MG tablet Commonly known as: PERCOCET/ROXICET Take 1-2 tablets by mouth every 6 (six) hours as needed for moderate pain.   Ozempic (1 MG/DOSE) 2 MG/1.5ML Sopn Generic drug: Semaglutide (1 MG/DOSE) Inject 1 mg into the skin every Monday. In the evening.   polyethylene glycol 17 g packet Commonly known as: MIRALAX /  GLYCOLAX Take 17 g by mouth daily as needed for mild constipation.   Potassium 99 MG Tabs Take 1 tablet by mouth daily.   PRESERVISION AREDS 2 PO Take 1 tablet by mouth 2 (two) times daily.   rosuvastatin 20 MG tablet Commonly known as: CRESTOR Take 20 mg by mouth every evening.   Systane 0.4-0.3 % Soln Generic drug: Polyethyl Glycol-Propyl Glycol Place 1 drop into both eyes 3 (three) times daily as needed (dry/irritated eyes.).   torsemide 20 MG tablet Commonly known as: DEMADEX Take 30 mg by mouth 2 (two) times daily.   Toujeo Max SoloStar 300 UNIT/ML Solostar Pen Generic drug: insulin glargine (2 Unit Dial) Inject 40 Units into the skin every morning. Titrate up to 60 units/day, per endocrinology instructions.   vitamin C 1000 MG tablet Take 1,000 mg by mouth daily.   Vitamin D (Ergocalciferol) 1.25 MG (50000 UNIT) Caps capsule Commonly known as: DRISDOL Take 50,000 Units by mouth every  Monday.       Allergies:  Allergies  Allergen Reactions  . Other Hives    Blue cheese    Family History: Family History  Problem Relation Age of Onset  . Heart failure Father   . Heart attack Brother   . Kidney cancer Neg Hx   . Bladder Cancer Neg Hx   . Prostate cancer Neg Hx     Social History:  reports that he has never smoked. He has never used smokeless tobacco. He reports current alcohol use. He reports that he does not use drugs.   Physical Exam: BP (!) 143/75   Pulse 76   Ht 6' (1.829 m)   Wt 265 lb (120.2 kg)   BMI 35.94 kg/m   Constitutional:  Alert and oriented, No acute distress. HEENT: [place] AT, moist mucus membranes.  Trachea midline, no masses. Cardiovascular: No clubbing, cyanosis, or edema. Respiratory: Normal respiratory effort, no increased work of breathing. Abd: incision healing well, no erythema or hermia Skin: No rashes, bruises or suspicious lesions. Neurologic: Grossly intact, no focal deficits, moving all 4 extremities. Psychiatric: Normal mood and affect.  Laboratory Data: See below, orders for labs with PCP   Assessment & Plan:    1. Flank pain with history of urolithiasis s/p left robotic simple nephrectomy 12/10/19 for chronically obstructed/ atrophic / infected kidney Labs scanned in from PCP on 01/18/2020 show hematocrit 30.7 and hemoglobin 9.3.   2. Left renal atrophy/hydronephrosis See above  3. CKD stage 3 Recent Cr 2, stabilized   F/u 1 year or sooner  [facility] [address] [place], [place] [address] [phone]  I, Selena Batten, am acting as a scribe for Dr. Hollice Espy.  I have reviewed the above documentation for accuracy and completeness, and I agree with the above.   Hollice Espy, MD

## 2020-01-22 ENCOUNTER — Ambulatory Visit (INDEPENDENT_AMBULATORY_CARE_PROVIDER_SITE_OTHER): Payer: Medicare Other | Admitting: Urology

## 2020-01-22 ENCOUNTER — Encounter: Payer: Self-pay | Admitting: Urology

## 2020-01-22 ENCOUNTER — Other Ambulatory Visit: Payer: Self-pay

## 2020-01-22 VITALS — BP 143/75 | HR 76 | Ht 72.0 in | Wt 265.0 lb

## 2020-01-22 DIAGNOSIS — N261 Atrophy of kidney (terminal): Secondary | ICD-10-CM

## 2020-01-22 DIAGNOSIS — N183 Chronic kidney disease, stage 3 unspecified: Secondary | ICD-10-CM

## 2020-01-22 DIAGNOSIS — N1330 Unspecified hydronephrosis: Secondary | ICD-10-CM

## 2020-04-06 ENCOUNTER — Other Ambulatory Visit: Payer: Self-pay

## 2020-04-06 ENCOUNTER — Encounter: Payer: Self-pay | Admitting: Internal Medicine

## 2020-04-06 ENCOUNTER — Ambulatory Visit (INDEPENDENT_AMBULATORY_CARE_PROVIDER_SITE_OTHER): Payer: Medicare Other | Admitting: Internal Medicine

## 2020-04-06 VITALS — BP 132/80 | HR 87 | Ht 72.0 in | Wt 271.0 lb

## 2020-04-06 DIAGNOSIS — N1832 Chronic kidney disease, stage 3b: Secondary | ICD-10-CM

## 2020-04-06 DIAGNOSIS — E785 Hyperlipidemia, unspecified: Secondary | ICD-10-CM

## 2020-04-06 DIAGNOSIS — I480 Paroxysmal atrial fibrillation: Secondary | ICD-10-CM

## 2020-04-06 DIAGNOSIS — I5032 Chronic diastolic (congestive) heart failure: Secondary | ICD-10-CM | POA: Diagnosis not present

## 2020-04-06 DIAGNOSIS — I2510 Atherosclerotic heart disease of native coronary artery without angina pectoris: Secondary | ICD-10-CM

## 2020-04-06 DIAGNOSIS — I10 Essential (primary) hypertension: Secondary | ICD-10-CM

## 2020-04-06 MED ORDER — CARVEDILOL 12.5 MG PO TABS
12.5000 mg | ORAL_TABLET | Freq: Two times a day (BID) | ORAL | 1 refills | Status: DC
Start: 2020-04-06 — End: 2020-05-02

## 2020-04-06 NOTE — Patient Instructions (Signed)
Medication Instructions:  Your physician has recommended you make the following change in your medication:  1- INCREASE Carvedilol to 12.5 mg by mouth two times a day.  *If you need a refill on your cardiac medications before your next appointment, please call your pharmacy*   Lab Work: none If you have labs (blood work) drawn today and your tests are completely normal, you will receive your results only by: [name] MyChart Message (if you have MyChart) OR . A paper copy in the mail If you have any lab test that is abnormal or we need to change your treatment, we will call you to review the results.   Testing/Procedures: Your physician has requested that you have an LIMITED echocardiogram IN 3 MONTHS ON SAME DAY AS APPOINTMENT. Echocardiography is a painless test that uses sound waves to create images of your heart. It provides your doctor with information about the size and shape of your heart and how well your heart's chambers and valves are working. This procedure takes approximately one hour. There are no restrictions for this procedure. You may get an IV, if needed, to receive an ultrasound enhancing agent through to better visualize your heart.    Follow-Up: At [facility], you and your health needs are our priority.  As part of our continuing mission to provide you with exceptional heart care, we have created designated Provider Care Teams.  These Care Teams include your primary Cardiologist (physician) and Advanced Practice Providers (APPs -  Physician Assistants and Nurse Practitioners) who all work together to provide you with the care you need, when you need it.  We recommend signing up for the patient portal called "MyChart".  Sign up information is provided on this After Visit Summary.  MyChart is used to connect with patients for Virtual Visits (Telemedicine).  Patients are able to view lab/test results, encounter notes, upcoming appointments, etc.  Non-urgent messages can be sent  to your provider as well.   To learn more about what you can do with MyChart, go to [email or web address].    Your next appointment:   3 month(s) WITH LIMITED ECHO ON THE SAME DAY  The format for your next appointment:   In Person  Provider:   You may see Nelva Bush, MD or one of the following Advanced Practice Providers on your designated Care Team:    Murray Hodgkins, NP  Christell Faith, PA-C  Marrianne Mood, PA-C  Cadence Ormsby, [place]

## 2020-04-06 NOTE — Progress Notes (Signed)
Follow-up Outpatient Visit Date: 04/06/2020  Primary Care Provider: Vidal Schwalbe, MD [address] [place] [address]  Chief Complaint: Follow-up cardiomyopathy, coronary artery disease, and atrial fibrillation  HPI:  [name] is a 69 y.o. male with history of chronic systolic and diastolic heart failure(LVEF 55-60% in 11/2016, reduced to 40-45% in 12/2019),coronary artery disease status post PCI to the proximal LAD, paroxysmal atrial fibrillation, chronic kidney disease stage III, hypertension, and type 2 diabetes mellitus, who presents for follow-up of heart failure, atrial fibrillation, and coronary artery disease.  He was last seen in our office in the late June by Jim Bayley, NP in anticipation of left nephrectomy for management of chronic hydronephrosis.  At that time, he noted dyspnea with minimal exertion.  Subsequent echo showed moderately reduced left ventricular systolic function (EF 67-12%) with moderately enlarged right ventricle with mildly reduced contraction.  Today, [name] reports that he is feeling much better following his left nephrectomy.  He feels like chronic infection and inflammation related to his ureteral obstruction was making him feel bad.  He has more energy and notes that his blood sugars have been under better control.  He continues to have exertional dyspnea with modest activity, which he attributes to being deconditioned.  He recently transitioned from furosemide to torsemide on his own, as he felt that furosemide was making his kidneys hurt.  [name] now taking torsemide 20 mg twice daily and feels like his fluid is well controlled with minimal edema.  He denies chest pain, palpitations, lightheadedness, and orthopnea.  --------------------------------------------------------------------------------------------------  Past Medical History:  Diagnosis Date  . CKD (chronic kidney disease) stage 3, GFR 30-59 ml/min (HCC)   . Coronary artery disease     a. 1998 s/p ACS Multi-link BMS to the prox LAD ([place], [place]); b. 07/2015 Cath: LM nl, LAD patent stent, LCX min irregs, OM1/2 min irregs, OM3 nl, RCA min irregs.  . Diabetes mellitus type 2, uncontrolled ([place])    a. A1c 11.0 in 07/2015.  [name] HFimpEF (heart failure with improved ejection fraction) ([place])    a. EF 25% by cath in 2013; b. Echo in 07/2015 showing EF of 15-20%, moderate MR, moderate Pulm HTN, severely dilated IVC; c. 11/2016 Echo: EF 55-60%, no rwma, Gr1 DD, mildly dil LA, nl RV fxn. d. 12/2019 Echo: EF 40-45% with mild RV dysfunction  . History of kidney stones   . Hypertension   . Ischemic cardiomyopathy   . Kidney stones    Left  . Paroxysmal atrial fibrillation ([place])    a. Dx 07/2015; b. CHA2DS2VASc = 5-->eliquis. Rhythm control w/ amiodarone.  . Pollen allergies 09-21-15   pt called and stated that he woke up and had some drainage-pt states he did not see the color of the drainage-pt denies running a fever and this only happened once-pt instructed to call Dr Audree Bane office if he starts running a fever or if the color of drainage becomes yellow/green  . Psoriasis   . Pulmonary hypertension ([place])   . Renal disorder    kidney stone  . Sleep apnea    Past Surgical History:  Procedure Laterality Date  . CARDIAC CATHETERIZATION N/A 07/25/2015   Procedure: Right/Left Heart Cath and Coronary Angiography;  Surgeon: Wellington Hampshire, MD;  Location: [facility];  Service: Cardiovascular;  Laterality: N/A;  . CARDIAC CATHETERIZATION     [place]  . CARDIAC CATHETERIZATION     [place], [place]  . CORONARY ANGIOPLASTY WITH  STENT PLACEMENT    . CYSTOSCOPY WITH STENT PLACEMENT Left 09/07/2015   Procedure: CYSTOSCOPY WITH STENT PLACEMENT;  Surgeon: Hollice Espy, MD;  Location: [facility];  Service: Urology;  Laterality: Left;  . CYSTOSCOPY/URETEROSCOPY/HOLMIUM LASER/STENT PLACEMENT Left 10/25/2015   Procedure: CYSTOSCOPY/URETEROSCOPY/HOLMIUM LASER/STENT EXCHANGE;  Surgeon:  Hollice Espy, MD;  Location: [facility];  Service: Urology;  Laterality: Left;  . CYSTOSCOPY/URETEROSCOPY/HOLMIUM LASER/STENT PLACEMENT Left 05/11/2019   Procedure: CYSTOSCOPY/URETEROSCOPY/HOLMIUM LASER/STENT PLACEMENT;  Surgeon: Hollice Espy, MD;  Location: [facility];  Service: Urology;  Laterality: Left;  . EXTRACORPOREAL SHOCK WAVE LITHOTRIPSY Left 04/02/2019   Procedure: EXTRACORPOREAL SHOCK WAVE LITHOTRIPSY (ESWL);  Surgeon: Hollice Espy, MD;  Location: [facility];  Service: Urology;  Laterality: Left;  . EXTRACORPOREAL SHOCK WAVE LITHOTRIPSY Left 03/05/2019   Procedure: EXTRACORPOREAL SHOCK WAVE LITHOTRIPSY (ESWL);  Surgeon: Abbie Sons, MD;  Location: [facility];  Service: Urology;  Laterality: Left;  . EYE SURGERY Bilateral    LASER FOR GLAUCOMA  . KIDNEY SURGERY    . NEPHROSTOMY TUBE PLACEMENT ([place] HX) Left 11/2019  . ROBOT ASSISTED LAPAROSCOPIC NEPHRECTOMY Left 12/10/2019   Procedure: XI ROBOTIC ASSISTED LAPAROSCOPIC NEPHRECTOMY;  Surgeon: Hollice Espy, MD;  Location: [facility];  Service: Urology;  Laterality: Left;  . URETEROSCOPY WITH HOLMIUM LASER LITHOTRIPSY Left 09/07/2015   Procedure: URETEROSCOPY WITH HOLMIUM LASER LITHOTRIPSY;  Surgeon: Hollice Espy, MD;  Location: [facility];  Service: Urology;  Laterality: Left;     Recent CV Pertinent Labs: Lab Results  Component Value Date   CHOL 153 07/09/2015   HDL 34 (L) 07/09/2015   LDLCALC 85 07/09/2015   TRIG 168 (H) 07/09/2015   CHOLHDL 4.5 07/09/2015   INR 1.1 12/08/2019   BNP 1,891.0 (H) 07/09/2015   K 3.6 12/13/2019   MG 2.0 07/29/2015   BUN 28 (H) 12/13/2019   BUN 39 (H) 11/22/2015   CREATININE 2.34 (H) 12/13/2019    Past medical and surgical history were reviewed and updated in EPIC.  Current Meds  Medication Sig  . amiodarone (PACERONE) 200 MG tablet Take 1 tablet (200 mg total) by mouth daily.  [name] apixaban (ELIQUIS) 5 MG TABS tablet Take 1 tablet (5 mg total) 2 (two) times daily by mouth.  . Ascorbic Acid  (VITAMIN C) 1000 MG tablet Take 1,000 mg by mouth daily.  . baclofen (LIORESAL) 10 MG tablet Take 10 mg by mouth every evening.   . carvedilol (COREG) 6.25 MG tablet TAKE 1 TABLET BY MOUTH TWICE DAILY  . clonazePAM (KLONOPIN) 1 MG tablet Take 1 mg by mouth at bedtime.   . Coenzyme Q10 (COQ10) 400 MG CAPS Take 400 mg by mouth 2 (two) times daily.  [name] dextromethorphan-guaiFENesin (MUCINEX DM) 30-600 MG 12hr tablet Take 1 tablet by mouth 2 (two) times daily.  [name] docusate sodium (COLACE) 100 MG capsule Take 1 capsule (100 mg total) by mouth 2 (two) times daily.  [name] glipiZIDE (GLUCOTROL) 10 MG tablet Take 10 mg by mouth 2 (two) times daily.   [name] Hawthorne Berry 550 MG CAPS Take 550 mg by mouth every evening.   . Insulin Glargine, 2 Unit Dial, (TOUJEO MAX SOLOSTAR) 300 UNIT/ML SOPN Inject 40 Units into the skin every morning. Titrate up to 60 units/day, per endocrinology instructions.  [name] JARDIANCE 10 MG TABS tablet Take 10 mg by mouth every evening.   . magnesium oxide (MAG-OX) 400 MG tablet Take 400 mg by mouth every morning.   . montelukast (SINGULAIR) 10 MG tablet Take 10 mg by mouth at bedtime.   [name]  oxybutynin (DITROPAN) 5 MG tablet Take 1 tablet (5 mg total) by mouth every 8 (eight) hours as needed for bladder spasms.  [name] OZEMPIC, 1 MG/DOSE, 2 MG/1.5ML SOPN Inject 1 mg into the skin every Monday. In the evening.  Vladimir Faster Glycol-Propyl Glycol (SYSTANE) 0.4-0.3 % SOLN Place 1 drop into both eyes 3 (three) times daily as needed (dry/irritated eyes.).  [name] polyethylene glycol (MIRALAX / GLYCOLAX) packet Take 17 g by mouth daily as needed for mild constipation.  . Potassium 99 MG TABS Take 1 tablet by mouth daily.  . rosuvastatin (CRESTOR) 20 MG tablet Take 20 mg by mouth every evening.   . torsemide (DEMADEX) 20 MG tablet Take 40 mg by mouth 2 (two) times daily.   . Vitamin D, Ergocalciferol, (DRISDOL) 1.25 MG (50000 UT) CAPS capsule Take 50,000 Units by mouth every Monday.     Allergies:  Other  Social History   Tobacco Use  . Smoking status: Never Smoker  . Smokeless tobacco: Never Used  Vaping Use  . Vaping Use: Never used  Substance Use Topics  . Alcohol use: Yes    Alcohol/week: 0.0 standard drinks    Comment: occasionally  . Drug use: No    Types: Marijuana, Cocaine, Opium, LSD    Comment: Past, greater than 10 years ago    Family History  Problem Relation Age of Onset  . Heart failure Father   . Heart attack Brother   . Kidney cancer Neg Hx   . Bladder Cancer Neg Hx   . Prostate cancer Neg Hx     Review of Systems: A 12-system review of systems was performed and was negative except as noted in the HPI.  --------------------------------------------------------------------------------------------------  Physical Exam: BP 132/80 (BP Location: Left Arm, Patient Position: Sitting, Cuff Size: Normal)   Pulse 87   Ht 6' (1.829 m)   Wt 271 lb (122.9 kg)   SpO2 90%   BMI 36.75 kg/m   General: NAD. Neck: No obvious JVD or HJR, though evaluation is limited by body habitus. Lungs: Clear to auscultation bilateral without wheezes or crackles. Heart: Regular rate and rhythm without murmurs, rubs, or gallops. Abdomen: Obese but soft soft.  Nontender and nondistended. Extremities: 1+ distal calf edema bilaterally.  EKG: Normal sinus rhythm with first-degree AV block (PR interval 226 ms), left axis deviation, and nonspecific intraventricular conduction delay.  No significant change from prior tracing on 12/02/2019.  Lab Results  Component Value Date   WBC 8.8 12/12/2019   HGB 8.8 (L) 12/13/2019   HCT 27.6 (L) 12/13/2019   MCV 92.2 12/12/2019   PLT 180 12/12/2019    Lab Results  Component Value Date   NA 136 12/13/2019   K 3.6 12/13/2019   CL 99 12/13/2019   CO2 27 12/13/2019   BUN 28 (H) 12/13/2019   CREATININE 2.34 (H) 12/13/2019   GLUCOSE 96 12/13/2019   ALT 26 04/10/2019    Lab Results  Component Value Date   CHOL 153 07/09/2015   HDL 34  (L) 07/09/2015   LDLCALC 85 07/09/2015   TRIG 168 (H) 07/09/2015   CHOLHDL 4.5 07/09/2015    --------------------------------------------------------------------------------------------------  ASSESSMENT AND PLAN: Chronic HFpEF: [name] reports feeling better following his nephrectomy with reasonable control of his fluid.  He has self transitioned from furosemide to torsemide.  His weight is stable, though some calf edema is evident on exam today.  He has stable exertional dyspnea consistent with NYHA class III heart failure.  Given his  renal insufficiency and solitary kidney, I think would be best to continue his current diuretic regimen.  In the setting of suboptimal blood pressure and heart rate control, we have agreed to increase carvedilol to 12.5 mg twice daily.  We will need to monitor his PR interval closely to ensure that he does not develop worsening conduction disease.  I am reluctant to add additional evidence-based heart failure therapy such as an ACE inhibitor/ARB or aldosterone antagonist in the setting of his chronic kidney disease.  We will plan to repeat a limited echocardiogram when [name] returns for follow-up in 3 months.  If LVEF has not improved, we will need to consider ischemia evaluation.  Coronary artery disease: [name] denies angina and reports stable to slightly improved exertional dyspnea.  We will continue his current medications for secondary prevention of CAD.  Paroxysmal atrial fibrillation: No evidence of recurrence.  Continue anticoagulation with apixaban 5 mg twice daily as well as amiodarone 200 mg daily.  LFTs drawn by his PCP in September were normal.  I do not see a recent TSH; thyroid function tests will need to be obtained at our next visit if they are not done in the interim by [name].  Hypertension: Blood pressure slightly elevated above our goal of less than 130/80.  Will increase carvedilol, as outlined above.  Hyperlipidemia: Lipids at  goal on last check with [name] in September (LDL 58).  We will continue with rosuvastatin 20 mg daily.  Chronic kidney disease stage IIIb: Renal function relatively stable.  Continue current diuretic regimen with avoidance of nephrotoxic drugs.  Ongoing follow-up per nephrology and urology.  Follow-up: Return to clinic in 3 months with limited echocardiogram at that time.  Nelva Bush, MD 04/06/2020 4:38 PM

## 2020-04-07 ENCOUNTER — Telehealth: Payer: Self-pay | Admitting: Internal Medicine

## 2020-04-07 ENCOUNTER — Encounter: Payer: Self-pay | Admitting: Internal Medicine

## 2020-04-07 NOTE — Telephone Encounter (Signed)
Per checkout 04/06/20 3 m fu echo same day   Attempted to schedule.  lmov

## 2020-05-02 ENCOUNTER — Other Ambulatory Visit: Payer: Self-pay | Admitting: Internal Medicine

## 2020-05-22 ENCOUNTER — Encounter: Payer: Self-pay | Admitting: Urology

## 2020-05-24 ENCOUNTER — Telehealth: Payer: Self-pay

## 2020-05-24 NOTE — Telephone Encounter (Signed)
Called pt back regarding his c/o "no energy, fluid retention and acute shortness of breath...can't walk to the bathroom withot being out of breath and am panting". Pt reports that he has spoken with Dr Bartolo Darter his PCP and Dr Smith Mince his new Nephrologist and they have adjusted his Torsemide several times. He was increase from 20mg  to 40mg  BID x3days then 40mg  in the am and 20mg  pm. Then was switch back to 40mg  BID. Nephrologist declines lasix d/t having one kidney. Reports CXR on 12/3 showed some fluid build up on lower lungs, has had some edema to bilateral ankles, and 20lbs weight gain in the past few months. Denies CP, pt is wanting Dr. Saunders Revel to "direct admit" him into the hospital to bypass the ED d/t covid concerns. Educated pt that we cannot direct admit at this time, and Dr. Saunders Revel is not here to take his call as requested. Advised to reach back out to his PCP or nephrologist, but pt declines stating "what else are they suppose to do, I done dealt with them" Advised pt that if he is not improving with the medications adjustments, still has [facility] with the littlest exertion, increase swelling/edema and he wants to be admitted, then best option would be to be seen in the ED for further evaluation and testing so that may be determine, there is no opening this week in our clinic to be seen. Pt verbalized understanding, although upset on not being direct admitted, otherwise all questions or concerns were address and no additional concerns at this time, will call back for anything further.

## 2020-05-24 NOTE — Telephone Encounter (Signed)
Patient wants to be advised if he should be seen or what he should do. Patient lives 1 hour away  Please advise

## 2020-07-08 ENCOUNTER — Ambulatory Visit (INDEPENDENT_AMBULATORY_CARE_PROVIDER_SITE_OTHER): Payer: Medicare Other

## 2020-07-08 ENCOUNTER — Ambulatory Visit (INDEPENDENT_AMBULATORY_CARE_PROVIDER_SITE_OTHER): Payer: Medicare Other | Admitting: Internal Medicine

## 2020-07-08 ENCOUNTER — Encounter: Payer: Self-pay | Admitting: Internal Medicine

## 2020-07-08 ENCOUNTER — Other Ambulatory Visit: Payer: Self-pay

## 2020-07-08 ENCOUNTER — Other Ambulatory Visit
Admission: RE | Admit: 2020-07-08 | Discharge: 2020-07-08 | Disposition: A | Discharge status: Home / Self Care | Payer: Medicare Other | Source: Ambulatory Visit | Attending: Internal Medicine | Admitting: Internal Medicine

## 2020-07-08 VITALS — BP 108/74 | HR 75 | Ht 72.0 in | Wt 294.2 lb

## 2020-07-08 DIAGNOSIS — N184 Chronic kidney disease, stage 4 (severe): Secondary | ICD-10-CM | POA: Insufficient documentation

## 2020-07-08 DIAGNOSIS — I2510 Atherosclerotic heart disease of native coronary artery without angina pectoris: Secondary | ICD-10-CM

## 2020-07-08 DIAGNOSIS — Z01810 Encounter for preprocedural cardiovascular examination: Secondary | ICD-10-CM | POA: Diagnosis not present

## 2020-07-08 DIAGNOSIS — I5032 Chronic diastolic (congestive) heart failure: Secondary | ICD-10-CM | POA: Diagnosis not present

## 2020-07-08 DIAGNOSIS — Z01812 Encounter for preprocedural laboratory examination: Secondary | ICD-10-CM | POA: Insufficient documentation

## 2020-07-08 DIAGNOSIS — I480 Paroxysmal atrial fibrillation: Secondary | ICD-10-CM | POA: Diagnosis not present

## 2020-07-08 DIAGNOSIS — E785 Hyperlipidemia, unspecified: Secondary | ICD-10-CM

## 2020-07-08 DIAGNOSIS — Z20822 Contact with and (suspected) exposure to covid-19: Secondary | ICD-10-CM | POA: Insufficient documentation

## 2020-07-08 DIAGNOSIS — I5023 Acute on chronic systolic (congestive) heart failure: Secondary | ICD-10-CM

## 2020-07-08 LAB — ECHOCARDIOGRAM LIMITED: S' Lateral: 5.45 cm

## 2020-07-08 MED ORDER — PERFLUTREN LIPID MICROSPHERE
1.0000 mL | INTRAVENOUS | Status: AC | PRN
  Administered 2020-07-08: 2 mL via INTRAVENOUS

## 2020-07-08 NOTE — Progress Notes (Signed)
Follow-up Outpatient Visit Date: 07/08/2020  Primary Care Provider: Vidal Schwalbe, MD [address] [place] [address]  Chief Complaint: Shortness of breath  HPI:  Mr. Ortwein is a 70 y.o. male with history of chronic systolic and diastolic heart failure(LVEF 55-60% in 11/2016, reduced to 40-45% in 12/2019),coronary artery disease status post PCI to the proximal LAD, paroxysmal atrial fibrillation, chronic kidney disease stage III, hypertension, and type 2 diabetes mellitus, who presents for follow-up of CAD, heart failure, and atrial fibrillation.  I last saw him in early November, at which time Mr. Paulsen was feeling much better following left nephrectomy.  He had transition from furosemide to torsemide on his own and felt like this was controlling his edema better.  Due to suboptimal blood pressure and heart rate control, we agreed to increase carvedilol 12.5 mg twice daily.  Today, Mr. Trebilcock reports that he has continued to have worsening shortness of breath.  He had been doing fairly well up until shortly before Thanksgiving when he suddenly began to feel more out of breath.  He reports that he was coughing up lots of phlegm, estimating that he brought up several "gallons" over the course of about 2 months.  He notes near resolution of his productive cough but still has considerable dyspnea walking just a few feet.  He has difficulty lying flat to begin with but is able to breathe after lying on his side for a few minutes.  He denies chest pain, palpitations, and lightheadedness though he has chronic vertigo.  He has not fallen or passed out.  He has minimal leg edema since his torsemide was increased to 40 mg twice daily by his new nephrologist, Dr. Smith Mince, at [facility] nephrology in [place].  --------------------------------------------------------------------------------------------------  Past Medical History:  Diagnosis Date  . CKD (chronic kidney disease) stage 3, GFR 30-59 ml/min  (HCC)   . Coronary artery disease    a. 1998 s/p ACS Multi-link BMS to the prox LAD ([place], [place]); b. 07/2015 Cath: LM nl, LAD patent stent, LCX min irregs, OM1/2 min irregs, OM3 nl, RCA min irregs.  . Diabetes mellitus type 2, uncontrolled ([place])    a. A1c 11.0 in 07/2015.  [name] HFimpEF (heart failure with improved ejection fraction) ([place])    a. EF 25% by cath in 2013; b. Echo in 07/2015 showing EF of 15-20%, moderate MR, moderate Pulm HTN, severely dilated IVC; c. 11/2016 Echo: EF 55-60%, no rwma, Gr1 DD, mildly dil LA, nl RV fxn. d. 12/2019 Echo: EF 40-45% with mild RV dysfunction  . History of kidney stones   . Hypertension   . Ischemic cardiomyopathy   . Kidney stones    Left  . Paroxysmal atrial fibrillation ([place])    a. Dx 07/2015; b. CHA2DS2VASc = 5-->eliquis. Rhythm control w/ amiodarone.  . Pollen allergies 09-21-15   pt called and stated that he woke up and had some drainage-pt states he did not see the color of the drainage-pt denies running a fever and this only happened once-pt instructed to call Dr Audree Bane office if he starts running a fever or if the color of drainage becomes yellow/green  . Psoriasis   . Pulmonary hypertension ([place])   . Renal disorder    kidney stone  . Sleep apnea    Past Surgical History:  Procedure Laterality Date  . CARDIAC CATHETERIZATION N/A 07/25/2015   Procedure: Right/Left Heart Cath and Coronary Angiography;  Surgeon: Wellington Hampshire, MD;  Location: [place] [facility];  Service: Cardiovascular;  Laterality: N/A;  . CARDIAC CATHETERIZATION     [place]  . CARDIAC CATHETERIZATION     [facility], [place]    . CYSTOSCOPY WITH STENT PLACEMENT Left 09/07/2015   Procedure: CYSTOSCOPY WITH STENT PLACEMENT;  Surgeon: Hollice Espy, MD;  Location: [facility];  Service: Urology;  Laterality: Left;  . CYSTOSCOPY/URETEROSCOPY/HOLMIUM LASER/STENT PLACEMENT Left 10/25/2015   Procedure:  CYSTOSCOPY/URETEROSCOPY/HOLMIUM LASER/STENT EXCHANGE;  Surgeon: Hollice Espy, MD;  Location: [facility];  Service: Urology;  Laterality: Left;  . CYSTOSCOPY/URETEROSCOPY/HOLMIUM LASER/STENT PLACEMENT Left 05/11/2019   Procedure: CYSTOSCOPY/URETEROSCOPY/HOLMIUM LASER/STENT PLACEMENT;  Surgeon: Hollice Espy, MD;  Location: [facility];  Service: Urology;  Laterality: Left;  . EXTRACORPOREAL SHOCK WAVE LITHOTRIPSY Left 04/02/2019   Procedure: EXTRACORPOREAL SHOCK WAVE LITHOTRIPSY (ESWL);  Surgeon: Hollice Espy, MD;  Location: [facility];  Service: Urology;  Laterality: Left;  . EXTRACORPOREAL SHOCK WAVE LITHOTRIPSY Left 03/05/2019   Procedure: EXTRACORPOREAL SHOCK WAVE LITHOTRIPSY (ESWL);  Surgeon: Abbie Sons, MD;  Location: [facility];  Service: Urology;  Laterality: Left;  . EYE SURGERY Bilateral    LASER FOR GLAUCOMA  . KIDNEY SURGERY    . NEPHROSTOMY TUBE PLACEMENT ([place] [facility]) Left 11/2019  . ROBOT ASSISTED LAPAROSCOPIC NEPHRECTOMY Left 12/10/2019   Procedure: XI ROBOTIC ASSISTED LAPAROSCOPIC NEPHRECTOMY;  Surgeon: Hollice Espy, MD;  Location: [facility];  Service: Urology;  Laterality: Left;  . URETEROSCOPY WITH HOLMIUM LASER LITHOTRIPSY Left 09/07/2015   Procedure: URETEROSCOPY WITH HOLMIUM LASER LITHOTRIPSY;  Surgeon: Hollice Espy, MD;  Location: [facility];  Service: Urology;  Laterality: Left;    Current Meds  Medication Sig  . amiodarone (PACERONE) 200 MG tablet Take 1 tablet (200 mg total) by mouth daily.  [name] apixaban (ELIQUIS) 5 MG TABS tablet Take 1 tablet (5 mg total) 2 (two) times daily by mouth.  . Ascorbic Acid (VITAMIN C) 1000 MG tablet Take 1,000 mg by mouth daily.  . carvedilol (COREG) 12.5 MG tablet Take 12.5 mg by mouth 2 (two) times daily with a meal.  . clonazePAM (KLONOPIN) 1 MG tablet Take 1 mg by mouth at bedtime.   . Coenzyme Q10 (COQ10) 400 MG CAPS Take 400 mg by mouth 2 (two) times daily.  [name] dextromethorphan-guaiFENesin (MUCINEX DM) 30-600 MG 12hr tablet Take 1 tablet  by mouth 2 (two) times daily.  [name] docusate sodium (COLACE) 100 MG capsule Take 1 capsule (100 mg total) by mouth 2 (two) times daily.  [name] glipiZIDE (GLUCOTROL) 10 MG tablet Take 10 mg by mouth 2 (two) times daily.   [name] Hawthorne Berry 550 MG CAPS Take 550 mg by mouth every evening.   . insulin glargine, 2 Unit Dial, (TOUJEO MAX) 300 UNIT/ML Solostar Pen Inject 28 Units into the skin every morning. Titrate up to 60 units/day, per endocrinology instructions.  [name] JARDIANCE 10 MG TABS tablet Take 10 mg by mouth every evening.   . liraglutide (VICTOZA) 18 MG/3ML SOPN Inject into the skin daily.  . magnesium oxide (MAG-OX) 400 MG tablet Take 400 mg by mouth every morning.   . montelukast (SINGULAIR) 10 MG tablet Take 10 mg by mouth at bedtime.   Vladimir Faster Glycol-Propyl Glycol (SYSTANE) 0.4-0.3 % SOLN Place 1 drop into both eyes 3 (three) times daily as needed (dry/irritated eyes.).  [name] polyethylene glycol (MIRALAX / GLYCOLAX) packet Take 17 g by mouth daily as needed for mild constipation.  . Potassium 99 MG TABS Take 1 tablet by mouth daily.  . rosuvastatin (CRESTOR) 20  MG tablet Take 20 mg by mouth every evening.   . torsemide (DEMADEX) 20 MG tablet Take 40 mg by mouth 2 (two) times daily.  . Vitamin D, Ergocalciferol, (DRISDOL) 1.25 MG (50000 UT) CAPS capsule Take 50,000 Units by mouth every Monday.   . [DISCONTINUED] carvedilol (COREG) 12.5 MG tablet TAKE 1 TABLET BY MOUTH TWICE DAILY    Allergies: Other  Social History   Tobacco Use  . Smoking status: Never Smoker  . Smokeless tobacco: Never Used  Vaping Use  . Vaping Use: Never used  Substance Use Topics  . Alcohol use: Yes    Alcohol/week: 0.0 standard drinks    Comment: occasionally  . Drug use: No    Types: Marijuana, Cocaine, Opium, LSD    Comment: Past, greater than 10 years ago    Family History  Problem Relation Age of Onset  . Heart failure Father   . Heart attack Brother   . Kidney cancer Neg Hx   . Bladder Cancer Neg  Hx   . Prostate cancer Neg Hx     Review of Systems: A 12-system review of systems was performed and was negative except as noted in the HPI.  --------------------------------------------------------------------------------------------------  Physical Exam: BP 108/74 (BP Location: Left Arm, Patient Position: Sitting, Cuff Size: Normal)   Pulse 75   Ht 6' (1.829 m)   Wt 294 lb 4 oz (133.5 kg)   SpO2 94%   BMI 39.91 kg/m   General:  NAD. Neck: Unable to assess JVP due to body habitus and facial hair. Lungs: Mildly diminished breath sounds throughout without wheezes or crackles. Heart: Regular rate and rhythm without murmurs, rubs, or gallops. Abdomen: Soft, nontender, nondistended. Extremities: No lower extremity edema.  EKG: Sinus rhythm with first-degree AV block, left axis deviation, low voltage, and IVCD.  PR interval has lengthened slightly since 04/06/2020.  Otherwise, no significant interval change.  Lab Results  Component Value Date   WBC 8.8 12/12/2019   HGB 8.8 (L) 12/13/2019   HCT 27.6 (L) 12/13/2019   MCV 92.2 12/12/2019   PLT 180 12/12/2019    Lab Results  Component Value Date   NA 136 12/13/2019   K 3.6 12/13/2019   CL 99 12/13/2019   CO2 27 12/13/2019   BUN 28 (H) 12/13/2019   CREATININE 2.34 (H) 12/13/2019   GLUCOSE 96 12/13/2019   ALT 26 04/10/2019    Lab Results  Component Value Date   CHOL 153 07/09/2015   HDL 34 (L) 07/09/2015   LDLCALC 85 07/09/2015   TRIG 168 (H) 07/09/2015   CHOLHDL 4.5 07/09/2015    --------------------------------------------------------------------------------------------------  ASSESSMENT AND PLAN: Acute on chronic HFrEF: Mr. Ohlin reports symptoms of worsening exertional dyspnea over the course of the last 2 to 3 months.  His echocardiogram in 12/2019 showed LVEF of 40-45%.  Limited echo performed today has not been officially read but shows severe biventricular failure with LVEF of less than 20% and global  hypokinesis.  Previous catheterization in 2017 showed moderate multivessel CAD with patent stent in the proximal to mid LAD.  I worry that his CAD may have worsened and also that his goal-directed medical therapy has been limited on the account of worsening chronic kidney disease.  We discussed referral to the emergency department today, but given relatively stable symptoms, have agreed to defer this.  We will arrange for right and left heart catheterization next week to evaluate for significant CAD as well as assess his filling pressures.  We  discussed that he is at elevated risk for contrast-induced nephropathy in the setting of his CKD.  However, the benefits of catheterization outweigh risk given his significant heart failure symptoms and marked decline in LVEF over the course of 7 months.  We will continue current doses of carvedilol, empagliflozin, and torsemide.  Shared Decision Making/Informed Consent The risks [stroke (1 in 1000), death (1 in 1000), kidney failure [usually temporary] (1 in 500), bleeding (1 in 200), allergic reaction [possibly serious] (1 in 200)], benefits (diagnostic support and management of coronary artery disease) and alternatives of a cardiac catheterization were discussed in detail with Mr. Beske and he is willing to proceed.  Coronary artery disease: No chest pain reported though I worry about progression of his ischemic heart disease in the setting of reduced LVEF.  We will continue current doses of carvedilol and rosuvastatin.  We will hold apixaban for 2 days prior to catheterization and have Mr. Plemons begin taking aspirin 81 mg daily at that time.  Paroxysmal atrial fibrillation: Mr. Mareno is maintaining sinus rhythm on amiodarone and apixaban.  We will plan to continue these, though apixaban will need to be held temporarily for aforementioned catheterization.  Hyperlipidemia: Continue rosuvastatin.  Chronic kidney disease stage IV: Continue close outpatient  follow-up with [facility]rology.  We will need to recheck his renal function today in anticipation of upcoming catheterization.  I do not think that prehydration before his catheterization would be of much utility given his severely reduced LVEF and risk for volume overload and decompensation.  We will attempt to minimize contrast use for his catheterization, including staging PCI if necessary.  Follow-up: Return to clinic in 2 weeks.  Nelva Bush, MD 07/08/2020 3:48 PM

## 2020-07-08 NOTE — Patient Instructions (Signed)
Medication Instructions:  Your physician recommends that you continue on your current medications as directed. Please refer to the Current Medication list given to you today.  *If you need a refill on your cardiac medications before your next appointment, please call your pharmacy*   Lab Work: Your physician recommends that you return for lab work in: [place], [facility].  COVID PRE- TEST: You will need a COVID TEST prior to the procedure:  LOCATION: [facility] site.  DATE/TIME:  Today, Friday, July 08, 2020  Call the number on front door once you drive [facility].   If you have labs (blood work) drawn today and your tests are completely normal, you will receive your results only by: [name] MyChart Message (if you have MyChart) OR . A paper copy in the mail If you have any lab test that is abnormal or we need to change your treatment, we will call you to review the results.   Testing/Procedures:   You are scheduled for a Right and left Cardiac Catheterization on Tuesday, February 8 with Dr. Harrell Gave End.  1. [facility] arrive at the [facility] at 8:00 AM (This time is two hours before your procedure to ensure your preparation). Free valet parking service is available.   Special note: Every effort is made to have your procedure done on time. Please understand that emergencies sometimes delay scheduled procedures.  2. Diet: Do not eat solid foods after midnight.  The patient may have clear liquids until 5am upon the day of the procedure.  3. Labs: You will need to have blood drawn on Friday, February 4 in clinic.  4. Medication instructions in preparation for your procedure:   Contrast Allergy: No  Stop taking Eliquis (Apixiban) on Sunday, February 6. Take Aspirin 81 mg by mouth once a day starting on Sunday, July 10, 2020.  Stop taking, Torsemide (Demadex) Tuesday, February 8,  Do not take Diabetes Med Jardiance and  Glipizide on the day of the procedure and HOLD 48 HOURS AFTER THE PROCEDURE.  Do not take any insulin the morning of procedure. Take half the usual evening dose of insulin the night before.   On the morning of your procedure, take your Aspirin and any morning medicines NOT listed above.  You may use sips of water.  5. Plan for one night stay--bring personal belongings. 6. Bring a current list of your medications and current insurance cards. 7. You MUST have a responsible person to drive you home. 8. Someone MUST be with you the first 24 hours after you arrive home or your discharge will be delayed. 9. Please wear clothes that are easy to get on and off and wear slip-on shoes.  Thank you for allowing [place] to care for you!   -- [facility]   Follow-Up: At [facility], you and your health needs are our priority.  As part of our continuing mission to provide you with exceptional heart care, we have created designated Provider Care Teams.  These Care Teams include your primary Cardiologist (physician) and Advanced Practice Providers (APPs -  Physician Assistants and Nurse Practitioners) who all work together to provide you with the care you need, when you need it.  We recommend signing up for the patient portal called "MyChart".  Sign up information is provided on this After Visit Summary.  MyChart is used to connect with patients for Virtual Visits (Telemedicine).  Patients are able  to view lab/test results, encounter notes, upcoming appointments, etc.  Non-urgent messages can be sent to your provider as well.   To learn more about what you can do with MyChart, go to [email or web address].    Your next appointment:   2 week(s)  The format for your next appointment:   In Person  Provider:   You may see Nelva Bush, MD or one of the following Advanced Practice Providers on your designated Care Team:    Murray Hodgkins, NP  Christell Faith,  PA-C  Marrianne Mood, PA-C  Cadence Kathlen Mody, [place]  Laurann Montana, NP    Coronary Angiogram With Stent Coronary angiogram with stent placement is a procedure to widen or open a narrow blood vessel of the heart (coronary artery). Arteries may become blocked by cholesterol buildup (plaques) in the lining of the artery wall. When a coronary artery becomes partially blocked, blood flow to that area decreases. This may lead to chest pain or a heart attack (myocardial infarction). A stent is a small piece of metal that looks like mesh or spring. Stent placement may be done as treatment after a heart attack, or to prevent a heart attack if a blocked artery is found by a coronary angiogram. Let your health care provider know about:  Any allergies you have, including allergies to medicines or contrast dye.  All medicines you are taking, including vitamins, herbs, eye drops, creams, and over-the-counter medicines.  Any problems you or family members have had with anesthetic medicines.  Any blood disorders you have.  Any surgeries you have had.  Any medical conditions you have, including kidney problems or kidney failure.  Whether you are pregnant or may be pregnant.  Whether you are breastfeeding. What are the risks? Generally, this is a safe procedure. However, serious problems may occur, including:  Damage to nearby structures or organs, such as the heart, blood vessels, or kidneys.  A return of blockage.  Bleeding, infection, or bruising at the insertion site.  A collection of blood under the skin (hematoma) at the insertion site.  A blood clot in another part of the body.  Allergic reaction to medicines or dyes.  Bleeding into the abdomen (retroperitoneal bleeding).  Stroke (rare).  Heart attack (rare). What happens before the procedure? Staying hydrated Follow instructions from your health care provider about hydration, which may include:  Up to 2 hours before the  procedure - you may continue to drink clear liquids, such as water, clear fruit juice, black coffee, and plain tea.   Eating and drinking restrictions Follow instructions from your health care provider about eating and drinking, which may include:  8 hours before the procedure - stop eating heavy meals or foods, such as meat, fried foods, or fatty foods.  6 hours before the procedure - stop eating light meals or foods, such as toast or cereal.  2 hours before the procedure - stop drinking clear liquids. Medicines Ask your health care provider about:  Changing or stopping your regular medicines. This is especially important if you are taking diabetes medicines or blood thinners.  Taking medicines such as aspirin and ibuprofen. These medicines can thin your blood. Do not take these medicines unless your health care provider tells you to take them. ? Generally, aspirin is recommended before a thin tube, called a catheter, is passed through a blood vessel and inserted into the heart (cardiac catheterization).  Taking over-the-counter medicines, vitamins, herbs, and supplements. General instructions  Do not use any products that contain  nicotine or tobacco for at least 4 weeks before the procedure. These products include cigarettes, e-cigarettes, and chewing tobacco. If you need help quitting, ask your health care provider.  Plan to have someone take you home from the hospital or clinic.  If you will be going home right after the procedure, plan to have someone with you for 24 hours.  You may have tests and imaging procedures.  Ask your health care provider: ? How your insertion site will be marked. Ask which artery will be used for the procedure. ? What steps will be taken to help prevent infection. These may include:  Removing hair at the insertion site.  Washing skin with a germ-killing soap.  Taking antibiotic medicine. What happens during the procedure?  An IV will be inserted  into one of your veins.  Electrodes may be placed on your chest to monitor your heart rate during the procedure.  You will be given one or more of the following: ? A medicine to help you relax (sedative). ? A medicine to numb the area (local anesthetic) for catheter insertion.  A small incision will be made for catheter insertion.  The catheter will be inserted into an artery using a guide wire. The location may be in your groin, your wrist, or the fold of your arm (near your elbow).  An X-ray procedure (fluoroscopy) will be used to help guide the catheter to the opening of the heart arteries.  A dye will be injected into the catheter. X-rays will be taken. The dye helps to show where any narrowing or blockages are located in the arteries.  Tell your health care provider if you have chest pain or trouble breathing.  A tiny wire will be guided to the blocked spot, and a balloon will be inflated to make the artery wider.  The stent will be expanded to crush the plaques into the wall of the vessel. The stent will hold the area open and improve the blood flow. Most stents have a drug coating to reduce the risk of the stent narrowing over time.  The artery may be made wider using a drill, laser, or other tools that remove plaques.  The catheter will be removed when the blood flow improves. The stent will stay where it was placed, and the lining of the artery will grow over it.  A bandage (dressing) will be placed on the insertion site. Pressure will be applied to stop bleeding.  The IV will be removed. This procedure may vary among health care providers and hospitals.   What happens after the procedure?  Your blood pressure, heart rate, breathing rate, and blood oxygen level will be monitored until you leave the hospital or clinic.  If the procedure is done through the leg, you will lie flat in bed for a few hours or for as long as told by your health care provider. You will be  instructed not to bend or cross your legs.  The insertion site and the pulse in your foot or wrist will be checked often.  You may have more blood tests, X-rays, and a test that records the electrical activity of your heart (electrocardiogram, or ECG).  Do not drive for 24 hours if you were given a sedative during your procedure. Summary  Coronary angiogram with stent placement is a procedure to widen or open a narrowed coronary artery. This is done to treat heart problems.  Before the procedure, let your health care provider know about all the  medical conditions and surgeries you have or have had.  This is a safe procedure. However, some problems may occur, including damage to nearby structures or organs, bleeding, blood clots, or allergies.  Follow your health care provider's instructions about eating, drinking, medicines, and other lifestyle changes, such as quitting tobacco use before the procedure. This information is not intended to replace advice given to you by your health care provider. Make sure you discuss any questions you have with your health care provider. Document Revised: 12/10/2018 Document Reviewed: 12/10/2018 Elsevier Patient Education  [place].

## 2020-07-08 NOTE — H&P (View-Only) (Signed)
Follow-up Outpatient Visit Date: 07/08/2020  Primary Care Provider: Vidal Schwalbe, MD [address] [place] [id number]  Chief Complaint: Shortness of breath  HPI:  [name] is a 70 y.o. male with history of chronic systolic and diastolic heart failure(LVEF 55-60% in 11/2016, reduced to 40-45% in 12/2019),coronary artery disease status post PCI to the proximal LAD, paroxysmal atrial fibrillation, chronic kidney disease stage III, hypertension, and type 2 diabetes mellitus, who presents for follow-up of CAD, heart failure, and atrial fibrillation.  I last saw him in early November, at which time [name] was feeling much better following left nephrectomy.  He had transition from furosemide to torsemide on his own and felt like this was controlling his edema better.  Due to suboptimal blood pressure and heart rate control, we agreed to increase carvedilol 12.5 mg twice daily.  Today, [name] reports that he has continued to have worsening shortness of breath.  He had been doing fairly well up until shortly before Thanksgiving when he suddenly began to feel more out of breath.  He reports that he was coughing up lots of phlegm, estimating that he brought up several "gallons" over the course of about 2 months.  He notes near resolution of his productive cough but still has considerable dyspnea walking just a few feet.  He has difficulty lying flat to begin with but is able to breathe after lying on his side for a few minutes.  He denies chest pain, palpitations, and lightheadedness though he has chronic vertigo.  He has not fallen or passed out.  He has minimal leg edema since his torsemide was increased to 40 mg twice daily by his new nephrologist, Dr. Smith Mince, at [facility] nephrology in [place].  --------------------------------------------------------------------------------------------------  Past Medical History:  Diagnosis Date  . CKD (chronic kidney disease) stage 3, GFR 30-59 ml/min  (HCC)   . Coronary artery disease    a. 1998 s/p ACS Multi-link BMS to the prox LAD ([place], [place]); b. 07/2015 Cath: LM nl, LAD patent stent, LCX min irregs, OM1/2 min irregs, OM3 nl, RCA min irregs.  . Diabetes mellitus type 2, uncontrolled ([place])    a. A1c 11.0 in 07/2015.  [name] HFimpEF (heart failure with improved ejection fraction) ([place])    a. EF 25% by cath in 2013; b. Echo in 07/2015 showing EF of 15-20%, moderate MR, moderate Pulm HTN, severely dilated IVC; c. 11/2016 Echo: EF 55-60%, no rwma, Gr1 DD, mildly dil LA, nl RV fxn. d. 12/2019 Echo: EF 40-45% with mild RV dysfunction  . History of kidney stones   . Hypertension   . Ischemic cardiomyopathy   . Kidney stones    Left  . Paroxysmal atrial fibrillation ([place])    a. Dx 07/2015; b. CHA2DS2VASc = 5-->eliquis. Rhythm control w/ amiodarone.  . Pollen allergies 09-21-15   pt called and stated that he woke up and had some drainage-pt states he did not see the color of the drainage-pt denies running a fever and this only happened once-pt instructed to call Dr Audree Bane office if he starts running a fever or if the color of drainage becomes yellow/green  . Psoriasis   . Pulmonary hypertension ([place])   . Renal disorder    kidney stone  . Sleep apnea    Past Surgical History:  Procedure Laterality Date  . CARDIAC CATHETERIZATION N/A 07/25/2015   Procedure: Right/Left Heart Cath and Coronary Angiography;  Surgeon: Wellington Hampshire, MD;  Location: [facility];  Service: Cardiovascular;  Laterality: N/A;  . CARDIAC [place]     [place]  . CARDIAC CATHETERIZATION     [facility], [place]    . CYSTOSCOPY WITH STENT PLACEMENT Left 09/07/2015   Procedure: CYSTOSCOPY WITH STENT PLACEMENT;  Surgeon: Hollice Espy, MD;  Location: [facility];  Service: Urology;  Laterality: Left;  . CYSTOSCOPY/URETEROSCOPY/HOLMIUM LASER/STENT PLACEMENT Left 10/25/2015   Procedure:  CYSTOSCOPY/URETEROSCOPY/HOLMIUM LASER/STENT EXCHANGE;  Surgeon: Hollice Espy, MD;  Location: [facility];  Service: Urology;  Laterality: Left;  . CYSTOSCOPY/URETEROSCOPY/HOLMIUM LASER/STENT PLACEMENT Left 05/11/2019   Procedure: CYSTOSCOPY/URETEROSCOPY/HOLMIUM LASER/STENT PLACEMENT;  Surgeon: Hollice Espy, MD;  Location: [facility];  Service: Urology;  Laterality: Left;  . EXTRACORPOREAL SHOCK WAVE LITHOTRIPSY Left 04/02/2019   Procedure: EXTRACORPOREAL SHOCK WAVE LITHOTRIPSY (ESWL);  Surgeon: Hollice Espy, MD;  Location: [facility];  Service: Urology;  Laterality: Left;  . EXTRACORPOREAL SHOCK WAVE LITHOTRIPSY Left 03/05/2019   Procedure: EXTRACORPOREAL SHOCK WAVE LITHOTRIPSY (ESWL);  Surgeon: Abbie Sons, MD;  Location: [facility];  Service: Urology;  Laterality: Left;  . EYE SURGERY Bilateral    LASER FOR GLAUCOMA  . KIDNEY SURGERY    . NEPHROSTOMY TUBE PLACEMENT ([place] H[facility]) Left 11/2019  . ROBOT ASSISTED LAPAROSCOPIC NEPHRECTOMY Left 12/10/2019   Procedure: XI ROBOTIC ASSISTED LAPAROSCOPIC NEPHRECTOMY;  Surgeon: Hollice Espy, MD;  Location: [facility];  Service: Urology;  Laterality: Left;  . URETEROSCOPY WITH HOLMIUM LASER LITHOTRIPSY Left 09/07/2015   Procedure: URETEROSCOPY WITH HOLMIUM LASER LITHOTRIPSY;  Surgeon: Hollice Espy, MD;  Location: [facility];  Service: Urology;  Laterality: Left;    Current Meds  Medication Sig  . amiodarone (PACERONE) 200 MG tablet Take 1 tablet (200 mg total) by mouth daily.  [name] apixaban (ELIQUIS) 5 MG TABS tablet Take 1 tablet (5 mg total) 2 (two) times daily by mouth.  . Ascorbic Acid (VITAMIN C) 1000 MG tablet Take 1,000 mg by mouth daily.  . carvedilol (COREG) 12.5 MG tablet Take 12.5 mg by mouth 2 (two) times daily with a meal.  . clonazePAM (KLONOPIN) 1 MG tablet Take 1 mg by mouth at bedtime.   . Coenzyme Q10 (COQ10) 400 MG CAPS Take 400 mg by mouth 2 (two) times daily.  [name] dextromethorphan-guaiFENesin (MUCINEX DM) 30-600 MG 12hr tablet Take 1 tablet  by mouth 2 (two) times daily.  [name] docusate sodium (COLACE) 100 MG capsule Take 1 capsule (100 mg total) by mouth 2 (two) times daily.  [name] glipiZIDE (GLUCOTROL) 10 MG tablet Take 10 mg by mouth 2 (two) times daily.   [name] Hawthorne Berry 550 MG CAPS Take 550 mg by mouth every evening.   . insulin glargine, 2 Unit Dial, (TOUJEO MAX) 300 UNIT/ML Solostar Pen Inject 28 Units into the skin every morning. Titrate up to 60 units/day, per endocrinology instructions.  [name] JARDIANCE 10 MG TABS tablet Take 10 mg by mouth every evening.   . liraglutide (VICTOZA) 18 MG/3ML SOPN Inject into the skin daily.  . magnesium oxide (MAG-OX) 400 MG tablet Take 400 mg by mouth every morning.   . montelukast (SINGULAIR) 10 MG tablet Take 10 mg by mouth at bedtime.   Vladimir Faster Glycol-Propyl Glycol (SYSTANE) 0.4-0.3 % SOLN Place 1 drop into both eyes 3 (three) times daily as needed (dry/irritated eyes.).  [name] polyethylene glycol (MIRALAX / GLYCOLAX) packet Take 17 g by mouth daily as needed for mild constipation.  . Potassium 99 MG TABS Take 1 tablet by mouth daily.  . rosuvastatin (CRESTOR) 20  MG tablet Take 20 mg by mouth every evening.   . torsemide (DEMADEX) 20 MG tablet Take 40 mg by mouth 2 (two) times daily.  . Vitamin D, Ergocalciferol, (DRISDOL) 1.25 MG (50000 UT) CAPS capsule Take 50,000 Units by mouth every Monday.   . [DISCONTINUED] carvedilol (COREG) 12.5 MG tablet TAKE 1 TABLET BY MOUTH TWICE DAILY    Allergies: Other  Social History   Tobacco Use  . Smoking status: Never Smoker  . Smokeless tobacco: Never Used  Vaping Use  . Vaping Use: Never used  Substance Use Topics  . Alcohol use: Yes    Alcohol/week: 0.0 standard drinks    Comment: occasionally  . Drug use: No    Types: Marijuana, Cocaine, Opium, LSD    Comment: Past, greater than 10 years ago    Family History  Problem Relation Age of Onset  . Heart failure Father   . Heart attack Brother   . Kidney cancer Neg Hx   . Bladder Cancer Neg  Hx   . Prostate cancer Neg Hx     Review of Systems: A 12-system review of systems was performed and was negative except as noted in the HPI.  --------------------------------------------------------------------------------------------------  Physical Exam: BP 108/74 (BP Location: Left Arm, Patient Position: Sitting, Cuff Size: Normal)   Pulse 75   Ht 6' (1.829 m)   Wt 294 lb 4 oz (133.5 kg)   SpO2 94%   BMI 39.91 kg/m   General:  NAD. Neck: Unable to assess JVP due to body habitus and facial hair. Lungs: Mildly diminished breath sounds throughout without wheezes or crackles. Heart: Regular rate and rhythm without murmurs, rubs, or gallops. Abdomen: Soft, nontender, nondistended. Extremities: No lower extremity edema.  EKG: Sinus rhythm with first-degree AV block, left axis deviation, low voltage, and IVCD.  PR interval has lengthened slightly since 04/06/2020.  Otherwise, no significant interval change.  Lab Results  Component Value Date   WBC 8.8 12/12/2019   HGB 8.8 (L) 12/13/2019   HCT 27.6 (L) 12/13/2019   MCV 92.2 12/12/2019   PLT 180 12/12/2019    Lab Results  Component Value Date   NA 136 12/13/2019   K 3.6 12/13/2019   CL 99 12/13/2019   CO2 27 12/13/2019   BUN 28 (H) 12/13/2019   CREATININE 2.34 (H) 12/13/2019   GLUCOSE 96 12/13/2019   ALT 26 04/10/2019    Lab Results  Component Value Date   CHOL 153 07/09/2015   HDL 34 (L) 07/09/2015   LDLCALC 85 07/09/2015   TRIG 168 (H) 07/09/2015   CHOLHDL 4.5 07/09/2015    --------------------------------------------------------------------------------------------------  ASSESSMENT AND PLAN: Acute on chronic HFrEF: Mr. Stettner reports symptoms of worsening exertional dyspnea over the course of the last 2 to 3 months.  His echocardiogram in 12/2019 showed LVEF of 40-45%.  Limited echo performed today has not been officially read but shows severe biventricular failure with LVEF of less than 20% and global  hypokinesis.  Previous catheterization in 2017 showed moderate multivessel CAD with patent stent in the proximal to mid LAD.  I worry that his CAD may have worsened and also that his goal-directed medical therapy has been limited on the account of worsening chronic kidney disease.  We discussed referral to the emergency department today, but given relatively stable symptoms, have agreed to defer this.  We will arrange for right and left heart catheterization next week to evaluate for significant CAD as well as assess his filling pressures.  We  discussed that he is at elevated risk for contrast-induced nephropathy in the setting of his CKD.  However, the benefits of catheterization outweigh risk given his significant heart failure symptoms and marked decline in LVEF over the course of 7 months.  We will continue current doses of carvedilol, empagliflozin, and torsemide.  Shared Decision Making/Informed Consent The risks [stroke (1 in 1000), death (1 in 1000), kidney failure [usually temporary] (1 in 500), bleeding (1 in 200), allergic reaction [possibly serious] (1 in 200)], benefits (diagnostic support and management of coronary artery disease) and alternatives of a cardiac catheterization were discussed in detail with Mr. Monforte and he is willing to proceed.  Coronary artery disease: No chest pain reported though I worry about progression of his ischemic heart disease in the setting of reduced LVEF.  We will continue current doses of carvedilol and rosuvastatin.  We will hold apixaban for 2 days prior to catheterization and have Mr. Want begin taking aspirin 81 mg daily at that time.  Paroxysmal atrial fibrillation: Mr. Hakim is maintaining sinus rhythm on amiodarone and apixaban.  We will plan to continue these, though apixaban will need to be held temporarily for aforementioned catheterization.  Hyperlipidemia: Continue rosuvastatin.  Chronic kidney disease stage IV: Continue close outpatient  follow-up with [facility] nephrology.  We will need to recheck his renal function today in anticipation of upcoming catheterization.  I do not think that prehydration before his catheterization would be of much utility given his severely reduced LVEF and risk for volume overload and decompensation.  We will attempt to minimize contrast use for his catheterization, including staging PCI if necessary.  Follow-up: Return to clinic in 2 weeks.  Nelva Bush, MD 07/08/2020 3:48 PM

## 2020-07-09 LAB — BASIC METABOLIC PANEL
BUN/Creatinine Ratio: 15 (ref 10–24)
BUN: 32 mg/dL — ABNORMAL HIGH (ref 8–27)
CO2: 30 mmol/L — ABNORMAL HIGH (ref 20–29)
Calcium: 9 mg/dL (ref 8.6–10.2)
Chloride: 99 mmol/L (ref 96–106)
Creatinine, Ser: 2.1 mg/dL — ABNORMAL HIGH (ref 0.76–1.27)
GFR calc Af Amer: 36 mL/min/{1.73_m2} — ABNORMAL LOW (ref >59)
GFR calc non Af Amer: 31 mL/min/{1.73_m2} — ABNORMAL LOW (ref >59)
Glucose: 111 mg/dL — ABNORMAL HIGH (ref 65–99)
Potassium: 4 mmol/L (ref 3.5–5.2)
Sodium: 143 mmol/L (ref 134–144)

## 2020-07-09 LAB — CBC WITH DIFFERENTIAL/PLATELET
Basophils Absolute: 0 10*3/uL (ref 0.0–0.2)
Basos: 1 %
EOS (ABSOLUTE): 0.1 10*3/uL (ref 0.0–0.4)
Eos: 1 %
Hematocrit: 40.3 % (ref 37.5–51.0)
Hemoglobin: 13.1 g/dL (ref 13.0–17.7)
Immature Grans (Abs): 0 10*3/uL (ref 0.0–0.1)
Immature Granulocytes: 0 %
Lymphocytes Absolute: 0.9 10*3/uL (ref 0.7–3.1)
Lymphs: 13 %
MCH: 30.8 pg (ref 26.6–33.0)
MCHC: 32.5 g/dL (ref 31.5–35.7)
MCV: 95 fL (ref 79–97)
Monocytes Absolute: 0.7 10*3/uL (ref 0.1–0.9)
Monocytes: 11 %
Neutrophils Absolute: 4.9 10*3/uL (ref 1.4–7.0)
Neutrophils: 74 %
Platelets: 144 10*3/uL — ABNORMAL LOW (ref 150–450)
RBC: 4.26 x10E6/uL (ref 4.14–5.80)
RDW: 16.6 % — ABNORMAL HIGH (ref 11.6–15.4)
WBC: 6.6 10*3/uL (ref 3.4–10.8)

## 2020-07-09 LAB — SARS CORONAVIRUS 2 (TAT 6-24 HRS): SARS Coronavirus 2: NEGATIVE

## 2020-07-12 ENCOUNTER — Encounter: Payer: Self-pay | Admitting: Internal Medicine

## 2020-07-12 ENCOUNTER — Other Ambulatory Visit: Payer: Self-pay

## 2020-07-12 ENCOUNTER — Inpatient Hospital Stay
Admission: RE | Admit: 2020-07-12 | Discharge: 2020-07-19 | DRG: 286 | Disposition: A | Discharge status: Home / Self Care | Payer: Medicare Other | Attending: Internal Medicine | Admitting: Internal Medicine

## 2020-07-12 ENCOUNTER — Encounter
Admission: RE | Disposition: A | Discharge status: Home / Self Care | Payer: Self-pay | Source: Home / Self Care | Attending: Internal Medicine

## 2020-07-12 DIAGNOSIS — I959 Hypotension, unspecified: Secondary | ICD-10-CM | POA: Diagnosis present

## 2020-07-12 DIAGNOSIS — I440 Atrioventricular block, first degree: Secondary | ICD-10-CM | POA: Diagnosis present

## 2020-07-12 DIAGNOSIS — I272 Pulmonary hypertension, unspecified: Secondary | ICD-10-CM | POA: Diagnosis present

## 2020-07-12 DIAGNOSIS — I2510 Atherosclerotic heart disease of native coronary artery without angina pectoris: Secondary | ICD-10-CM

## 2020-07-12 DIAGNOSIS — I10 Essential (primary) hypertension: Secondary | ICD-10-CM | POA: Diagnosis present

## 2020-07-12 DIAGNOSIS — Z794 Long term (current) use of insulin: Secondary | ICD-10-CM

## 2020-07-12 DIAGNOSIS — Z87442 Personal history of urinary calculi: Secondary | ICD-10-CM

## 2020-07-12 DIAGNOSIS — E876 Hypokalemia: Secondary | ICD-10-CM | POA: Diagnosis present

## 2020-07-12 DIAGNOSIS — N1832 Chronic kidney disease, stage 3b: Secondary | ICD-10-CM | POA: Diagnosis not present

## 2020-07-12 DIAGNOSIS — Z91018 Allergy to other foods: Secondary | ICD-10-CM

## 2020-07-12 DIAGNOSIS — I480 Paroxysmal atrial fibrillation: Secondary | ICD-10-CM | POA: Diagnosis not present

## 2020-07-12 DIAGNOSIS — E1122 Type 2 diabetes mellitus with diabetic chronic kidney disease: Secondary | ICD-10-CM | POA: Diagnosis present

## 2020-07-12 DIAGNOSIS — R188 Other ascites: Secondary | ICD-10-CM

## 2020-07-12 DIAGNOSIS — E785 Hyperlipidemia, unspecified: Secondary | ICD-10-CM | POA: Diagnosis present

## 2020-07-12 DIAGNOSIS — I5023 Acute on chronic systolic (congestive) heart failure: Secondary | ICD-10-CM | POA: Diagnosis present

## 2020-07-12 DIAGNOSIS — E1165 Type 2 diabetes mellitus with hyperglycemia: Secondary | ICD-10-CM | POA: Diagnosis present

## 2020-07-12 DIAGNOSIS — Z7984 Long term (current) use of oral hypoglycemic drugs: Secondary | ICD-10-CM

## 2020-07-12 DIAGNOSIS — I428 Other cardiomyopathies: Secondary | ICD-10-CM | POA: Diagnosis present

## 2020-07-12 DIAGNOSIS — Z955 Presence of coronary angioplasty implant and graft: Secondary | ICD-10-CM

## 2020-07-12 DIAGNOSIS — I872 Venous insufficiency (chronic) (peripheral): Secondary | ICD-10-CM | POA: Diagnosis present

## 2020-07-12 DIAGNOSIS — I5022 Chronic systolic (congestive) heart failure: Secondary | ICD-10-CM | POA: Diagnosis present

## 2020-07-12 DIAGNOSIS — Z79899 Other long term (current) drug therapy: Secondary | ICD-10-CM

## 2020-07-12 DIAGNOSIS — Z8249 Family history of ischemic heart disease and other diseases of the circulatory system: Secondary | ICD-10-CM

## 2020-07-12 DIAGNOSIS — G4733 Obstructive sleep apnea (adult) (pediatric): Secondary | ICD-10-CM

## 2020-07-12 DIAGNOSIS — Z79890 Hormone replacement therapy (postmenopausal): Secondary | ICD-10-CM

## 2020-07-12 DIAGNOSIS — I340 Nonrheumatic mitral (valve) insufficiency: Secondary | ICD-10-CM | POA: Diagnosis present

## 2020-07-12 DIAGNOSIS — IMO0002 Reserved for concepts with insufficient information to code with codable children: Secondary | ICD-10-CM | POA: Diagnosis present

## 2020-07-12 DIAGNOSIS — Z6841 Body Mass Index (BMI) 40.0 and over, adult: Secondary | ICD-10-CM

## 2020-07-12 DIAGNOSIS — I493 Ventricular premature depolarization: Secondary | ICD-10-CM | POA: Diagnosis present

## 2020-07-12 DIAGNOSIS — I130 Hypertensive heart and chronic kidney disease with heart failure and stage 1 through stage 4 chronic kidney disease, or unspecified chronic kidney disease: Principal | ICD-10-CM | POA: Diagnosis present

## 2020-07-12 DIAGNOSIS — R140 Abdominal distension (gaseous): Secondary | ICD-10-CM

## 2020-07-12 DIAGNOSIS — I5082 Biventricular heart failure: Secondary | ICD-10-CM | POA: Diagnosis present

## 2020-07-12 DIAGNOSIS — N183 Chronic kidney disease, stage 3 unspecified: Secondary | ICD-10-CM

## 2020-07-12 DIAGNOSIS — R946 Abnormal results of thyroid function studies: Secondary | ICD-10-CM | POA: Diagnosis present

## 2020-07-12 DIAGNOSIS — Z7901 Long term (current) use of anticoagulants: Secondary | ICD-10-CM

## 2020-07-12 DIAGNOSIS — Z905 Acquired absence of kidney: Secondary | ICD-10-CM

## 2020-07-12 DIAGNOSIS — E1169 Type 2 diabetes mellitus with other specified complication: Secondary | ICD-10-CM | POA: Diagnosis present

## 2020-07-12 DIAGNOSIS — R945 Abnormal results of liver function studies: Secondary | ICD-10-CM

## 2020-07-12 DIAGNOSIS — I25118 Atherosclerotic heart disease of native coronary artery with other forms of angina pectoris: Secondary | ICD-10-CM | POA: Diagnosis present

## 2020-07-12 DIAGNOSIS — R7989 Other specified abnormal findings of blood chemistry: Secondary | ICD-10-CM | POA: Diagnosis present

## 2020-07-12 DIAGNOSIS — E039 Hypothyroidism, unspecified: Secondary | ICD-10-CM | POA: Diagnosis present

## 2020-07-12 HISTORY — PX: RIGHT/LEFT HEART CATH AND CORONARY ANGIOGRAPHY: CATH118266

## 2020-07-12 LAB — GLUCOSE, CAPILLARY
Glucose-Capillary: 118 mg/dL — ABNORMAL HIGH (ref 70–99)
Glucose-Capillary: 55 mg/dL — ABNORMAL LOW (ref 70–99)
Glucose-Capillary: 145 mg/dL — ABNORMAL HIGH (ref 70–99)
Glucose-Capillary: 91 mg/dL (ref 70–99)

## 2020-07-12 SURGERY — RIGHT/LEFT HEART CATH AND CORONARY ANGIOGRAPHY
Anesthesia: Moderate Sedation

## 2020-07-12 MED ORDER — PSYLLIUM 0.52 G PO CAPS: 2.6000 g | ORAL_CAPSULE | Freq: Every day | ORAL | Status: DC

## 2020-07-12 MED ORDER — CLONAZEPAM 1 MG PO TABS
1.0000 mg | ORAL_TABLET | Freq: Every evening | ORAL | Status: DC | PRN
  Administered 2020-07-16 – 2020-07-17 (×2): 1 mg via ORAL

## 2020-07-12 MED ORDER — CLONAZEPAM 1 MG PO TABS
1.0000 mg | ORAL_TABLET | Freq: Every day | ORAL | Status: DC | PRN
  Filled 2020-07-12 (×2): qty 1

## 2020-07-12 MED ORDER — HEPARIN (PORCINE) IN NACL 1000-0.9 UT/500ML-% IV SOLN
INTRAVENOUS | Status: DC | PRN
  Administered 2020-07-12: 500 mL

## 2020-07-12 MED ORDER — SODIUM CHLORIDE 0.9 % IV SOLN: 250.0000 mL | INTRAVENOUS | Status: DC | PRN

## 2020-07-12 MED ORDER — ROSUVASTATIN CALCIUM 10 MG PO TABS
20.0000 mg | ORAL_TABLET | Freq: Every evening | ORAL | Status: DC
  Administered 2020-07-12 – 2020-07-18 (×7): 20 mg via ORAL
  Filled 2020-07-12 (×2): qty 1
  Filled 2020-07-12: qty 2
  Filled 2020-07-12 (×3): qty 1
  Filled 2020-07-12 (×2): qty 2

## 2020-07-12 MED ORDER — HEPARIN SODIUM (PORCINE) 1000 UNIT/ML IJ SOLN
INTRAMUSCULAR | Status: DC | PRN
  Administered 2020-07-12: 6000 [IU] via INTRAVENOUS

## 2020-07-12 MED ORDER — CARVEDILOL 6.25 MG PO TABS
6.2500 mg | ORAL_TABLET | Freq: Two times a day (BID) | ORAL | Status: DC
  Administered 2020-07-13 – 2020-07-19 (×10): 6.25 mg via ORAL
  Filled 2020-07-12 (×5): qty 1
  Filled 2020-07-12: qty 0.5
  Filled 2020-07-12 (×3): qty 1

## 2020-07-12 MED ORDER — HYDRALAZINE HCL 20 MG/ML IJ SOLN: 10.0000 mg | INTRAMUSCULAR | Status: AC | PRN

## 2020-07-12 MED ORDER — ONDANSETRON HCL 4 MG/2ML IJ SOLN: 4.0000 mg | Freq: Four times a day (QID) | INTRAMUSCULAR | Status: DC | PRN

## 2020-07-12 MED ORDER — FENTANYL CITRATE (PF) 100 MCG/2ML IJ SOLN
INTRAMUSCULAR | Status: AC
  Filled 2020-07-12: qty 2

## 2020-07-12 MED ORDER — MONTELUKAST SODIUM 10 MG PO TABS
10.0000 mg | ORAL_TABLET | Freq: Every day | ORAL | Status: DC
  Administered 2020-07-12 – 2020-07-18 (×7): 10 mg via ORAL
  Filled 2020-07-12 (×8): qty 1

## 2020-07-12 MED ORDER — COQ10 400 MG PO CAPS: 400.0000 mg | ORAL_CAPSULE | Freq: Two times a day (BID) | ORAL | Status: DC

## 2020-07-12 MED ORDER — SODIUM CHLORIDE 0.9% FLUSH
3.0000 mL | Freq: Two times a day (BID) | INTRAVENOUS | Status: DC
  Administered 2020-07-12 – 2020-07-18 (×13): 3 mL via INTRAVENOUS

## 2020-07-12 MED ORDER — LIDOCAINE HCL (PF) 1 % IJ SOLN
INTRAMUSCULAR | Status: DC | PRN
  Administered 2020-07-12: 5 mL

## 2020-07-12 MED ORDER — MIDAZOLAM HCL 2 MG/2ML IJ SOLN
INTRAMUSCULAR | Status: DC | PRN
  Administered 2020-07-12: 0.5 mg via INTRAVENOUS

## 2020-07-12 MED ORDER — INSULIN ASPART 100 UNIT/ML ~~LOC~~ SOLN
0.0000 [IU] | Freq: Three times a day (TID) | SUBCUTANEOUS | Status: DC
  Administered 2020-07-14: 3 [IU] via SUBCUTANEOUS
  Administered 2020-07-15 – 2020-07-16 (×3): 2 [IU] via SUBCUTANEOUS
  Administered 2020-07-16 – 2020-07-17 (×2): 3 [IU] via SUBCUTANEOUS
  Administered 2020-07-17 – 2020-07-18 (×2): 2 [IU] via SUBCUTANEOUS
  Administered 2020-07-18: 5 [IU] via SUBCUTANEOUS
  Administered 2020-07-19: 2 [IU] via SUBCUTANEOUS
  Filled 2020-07-12 (×8): qty 1

## 2020-07-12 MED ORDER — DIPHENHYDRAMINE HCL 25 MG PO CAPS
ORAL_CAPSULE | ORAL | Status: AC
  Administered 2020-07-12: 25 mg via ORAL
  Filled 2020-07-12: qty 1

## 2020-07-12 MED ORDER — SODIUM CHLORIDE 0.9 % IV SOLN
INTRAVENOUS | Status: DC
  Administered 2020-07-12: 1000 mL via INTRAVENOUS

## 2020-07-12 MED ORDER — HEPARIN SODIUM (PORCINE) 1000 UNIT/ML IJ SOLN
INTRAMUSCULAR | Status: AC
  Filled 2020-07-12: qty 1

## 2020-07-12 MED ORDER — PSYLLIUM 95 % PO PACK
1.0000 | PACK | Freq: Every day | ORAL | Status: DC
  Administered 2020-07-13 – 2020-07-19 (×7): 1 via ORAL
  Filled 2020-07-12 (×8): qty 1

## 2020-07-12 MED ORDER — LIRAGLUTIDE 18 MG/3ML ~~LOC~~ SOPN: 1.8000 mg | PEN_INJECTOR | Freq: Every evening | SUBCUTANEOUS | Status: DC

## 2020-07-12 MED ORDER — MAGNESIUM OXIDE 400 (241.3 MG) MG PO TABS
400.0000 mg | ORAL_TABLET | Freq: Every day | ORAL | Status: DC
  Administered 2020-07-13 – 2020-07-19 (×7): 400 mg via ORAL
  Filled 2020-07-12 (×8): qty 1

## 2020-07-12 MED ORDER — FUROSEMIDE 10 MG/ML IJ SOLN
80.0000 mg | Freq: Two times a day (BID) | INTRAMUSCULAR | Status: DC
  Administered 2020-07-12 – 2020-07-15 (×2): 80 mg via INTRAVENOUS

## 2020-07-12 MED ORDER — INSULIN GLARGINE 100 UNIT/ML ~~LOC~~ SOLN
28.0000 [IU] | Freq: Every day | SUBCUTANEOUS | Status: DC
  Filled 2020-07-12 (×8): qty 0.28

## 2020-07-12 MED ORDER — MIDAZOLAM HCL 2 MG/2ML IJ SOLN
INTRAMUSCULAR | Status: AC
  Filled 2020-07-12: qty 2

## 2020-07-12 MED ORDER — ASPIRIN 81 MG PO CHEW: 81.0000 mg | CHEWABLE_TABLET | ORAL | Status: DC

## 2020-07-12 MED ORDER — ACETAMINOPHEN 325 MG PO TABS
650.0000 mg | ORAL_TABLET | ORAL | Status: DC | PRN
  Administered 2020-07-16 – 2020-07-18 (×3): 650 mg via ORAL
  Filled 2020-07-12 (×3): qty 2

## 2020-07-12 MED ORDER — DIPHENHYDRAMINE HCL 25 MG PO CAPS
25.0000 mg | ORAL_CAPSULE | Freq: Every day | ORAL | Status: DC
  Administered 2020-07-15 – 2020-07-18 (×4): 25 mg via ORAL
  Filled 2020-07-12 (×4): qty 1

## 2020-07-12 MED ORDER — DEXTROSE 50 % IV SOLN: 25.0000 g | Freq: Once | INTRAVENOUS | Status: AC

## 2020-07-12 MED ORDER — AMIODARONE HCL 200 MG PO TABS
200.0000 mg | ORAL_TABLET | Freq: Every day | ORAL | Status: DC
  Administered 2020-07-13: 200 mg via ORAL
  Filled 2020-07-12 (×3): qty 1

## 2020-07-12 MED ORDER — VERAPAMIL HCL 2.5 MG/ML IV SOLN
INTRAVENOUS | Status: DC | PRN
  Administered 2020-07-12: 2.5 mg via INTRA_ARTERIAL

## 2020-07-12 MED ORDER — VERAPAMIL HCL 2.5 MG/ML IV SOLN
INTRAVENOUS | Status: AC
  Filled 2020-07-12: qty 2

## 2020-07-12 MED ORDER — CARBOXYMETHYLCELLULOSE SODIUM 0.5 % OP SOLN: 1.0000 [drp] | Freq: Three times a day (TID) | OPHTHALMIC | Status: DC | PRN

## 2020-07-12 MED ORDER — POLYVINYL ALCOHOL 1.4 % OP SOLN
1.0000 [drp] | OPHTHALMIC | Status: DC | PRN
  Filled 2020-07-12: qty 15

## 2020-07-12 MED ORDER — CARVEDILOL 12.5 MG PO TABS
ORAL_TABLET | ORAL | Status: AC
  Administered 2020-07-12: 6.25 mg via ORAL
  Filled 2020-07-12: qty 1

## 2020-07-12 MED ORDER — HEPARIN (PORCINE) IN NACL 1000-0.9 UT/500ML-% IV SOLN
INTRAVENOUS | Status: AC
  Filled 2020-07-12: qty 1000

## 2020-07-12 MED ORDER — LIDOCAINE HCL (PF) 1 % IJ SOLN
INTRAMUSCULAR | Status: AC
  Filled 2020-07-12: qty 30

## 2020-07-12 MED ORDER — APIXABAN 5 MG PO TABS
5.0000 mg | ORAL_TABLET | Freq: Two times a day (BID) | ORAL | Status: DC
  Administered 2020-07-12 – 2020-07-19 (×14): 5 mg via ORAL
  Filled 2020-07-12 (×16): qty 1

## 2020-07-12 MED ORDER — LABETALOL HCL 5 MG/ML IV SOLN: 10.0000 mg | INTRAVENOUS | Status: AC | PRN

## 2020-07-12 MED ORDER — SODIUM CHLORIDE 0.9% FLUSH: 3.0000 mL | INTRAVENOUS | Status: DC | PRN

## 2020-07-12 MED ORDER — EMPAGLIFLOZIN 10 MG PO TABS
10.0000 mg | ORAL_TABLET | Freq: Every evening | ORAL | Status: DC
  Administered 2020-07-12 – 2020-07-18 (×7): 10 mg via ORAL
  Filled 2020-07-12 (×10): qty 1

## 2020-07-12 MED ORDER — INSULIN GLARGINE (2 UNIT DIAL) 300 UNIT/ML ~~LOC~~ SOPN: 28.0000 [IU] | PEN_INJECTOR | Freq: Every day | SUBCUTANEOUS | Status: DC

## 2020-07-12 MED ORDER — DOCUSATE SODIUM 100 MG PO CAPS
300.0000 mg | ORAL_CAPSULE | Freq: Two times a day (BID) | ORAL | Status: DC
  Administered 2020-07-12 – 2020-07-19 (×15): 300 mg via ORAL
  Filled 2020-07-12 (×17): qty 3

## 2020-07-12 MED ORDER — FENTANYL CITRATE (PF) 100 MCG/2ML IJ SOLN
INTRAMUSCULAR | Status: DC | PRN
  Administered 2020-07-12: 12.5 ug via INTRAVENOUS

## 2020-07-12 MED ORDER — SODIUM CHLORIDE 0.9% FLUSH: 3.0000 mL | Freq: Two times a day (BID) | INTRAVENOUS | Status: DC

## 2020-07-12 MED ORDER — INSULIN ASPART 100 UNIT/ML ~~LOC~~ SOLN
SUBCUTANEOUS | Status: AC
  Administered 2020-07-12: 2 [IU] via SUBCUTANEOUS
  Filled 2020-07-12: qty 1

## 2020-07-12 MED ORDER — DEXTROSE 50 % IV SOLN
INTRAVENOUS | Status: AC
  Administered 2020-07-12: 25 g via INTRAVENOUS
  Filled 2020-07-12: qty 50

## 2020-07-12 MED ORDER — MAGNESIUM 300 MG PO CAPS: 300.0000 mg | ORAL_CAPSULE | Freq: Every day | ORAL | Status: DC

## 2020-07-12 MED ORDER — IOHEXOL 300 MG/ML  SOLN
INTRAMUSCULAR | Status: DC | PRN
  Administered 2020-07-12: 27 mL

## 2020-07-12 SURGICAL SUPPLY — 10 items
CATH BALLN WEDGE 5F 110CM (CATHETERS) ×2 IMPLANT
CATH INFINITI 5 FR JL3.5 (CATHETERS) ×2 IMPLANT
CATH INFINITI JR4 5F (CATHETERS) ×2 IMPLANT
DEVICE RAD COMP TR BAND LRG (VASCULAR PRODUCTS) ×2 IMPLANT
GLIDESHEATH SLEND SS 6F .021 (SHEATH) ×2 IMPLANT
GUIDEWIRE INQWIRE 1.5J.035X260 (WIRE) ×1 IMPLANT
INQWIRE 1.5J .035X260CM (WIRE) ×2
KIT MANI 3VAL PERCEP (MISCELLANEOUS) ×2 IMPLANT
PACK CARDIAC CATH (CUSTOM PROCEDURE TRAY) ×2 IMPLANT
SHEATH GLIDE SLENDER 4/5FR (SHEATH) ×2 IMPLANT

## 2020-07-12 NOTE — Interval H&P Note (Signed)
History and Physical Interval Note:  07/12/2020 8:42 AM  [name]  has presented today for surgery, with the diagnosis of acute on chronic HFrEF.  The various methods of treatment have been discussed with the patient and family. After consideration of risks, benefits and other options for treatment, the patient has consented to  Procedure(s): RIGHT/LEFT HEART CATH AND CORONARY ANGIOGRAPHY (N/A) as a surgical intervention.  The patient's history has been reviewed, patient examined, no change in status, stable for surgery.  I have reviewed the patient's chart and labs.  Questions were answered to the patient's satisfaction.    Cath Lab Visit (complete for each Cath Lab visit)  Clinical Evaluation Leading to the Procedure:   ACS: No.  Non-ACS:    Anginal/Heart Failure Classification: NYHA class III-IV  Anti-ischemic medical therapy: No Therapy  Non-Invasive Test Results: No non-invasive testing performed (LVEF 20-25% -> high risk)  Prior CABG: No previous CABG  [name]

## 2020-07-12 NOTE — Discharge Instructions (Signed)

## 2020-07-12 NOTE — Progress Notes (Signed)
Patient tolerated meal tray well. Denies need for anything else at this time. Patient has no complaint of pain. TR band completely deflated at 1130 without bleeding at this time. Have updated to patient as to room assigned.Patient's family member to go home at this time.

## 2020-07-12 NOTE — H&P (Addendum)
Cardiology Admission History and Physical:   Patient ID: [name] MRN: [id number]; DOB: 08/01/1950   Admission date: 07/12/2020  Primary Care Provider: Vidal Schwalbe, MD [facility] Cardiologist: Nelva Bush, MD  [facility] Electrophysiologist:  None   Chief Complaint:  Shortness of breath  Patient Profile:   [name] is a 70 y.o. male with history of chronic HFrEF, coronary artery disease status post remote PCI to the proximal LAD, paroxysmal atrial fibrillation, chronic kidney disease stage III, hypertension, and type 2 diabetes mellitus, admitted following diagnostic catheterization revealing severely elevated left and right heart filling pressures consistent with acute decompensated heart failure.  History of Present Illness:   [name] was seen in the office last week (07/08/2020), which time he reported significant progression of chronic exertional dyspnea over the preceding 2-3 months.  He noted getting out of breath walking only a few feet.  He also endorsed worsening orthopnea.  He had not had any chest pain, palpitations, or lightheadedness.  Lower extremity edema had become more problematic, prompting him to increase torsemide to 40 mg twice daily at the direction of his nephrologist.  Echocardiogram last week revealed severe biventricular dysfunction, prompting referral for left and right heart catheterization today.  Today, [name] reports stable exertional dyspnea without chest pain, palpitations, or lightheadedness.  Catheterization showed severely elevated left heart, right heart, and pulmonary artery pressures.  Moderate, nonobstructive coronary artery disease was seen.   Past Medical History:  Diagnosis Date  . CKD (chronic kidney disease) stage 3, GFR 30-59 ml/min (HCC)   . Coronary artery disease    a. 1998 s/p ACS Multi-link BMS to the prox LAD ([place], [place]); b. 07/2015 Cath: LM nl, LAD patent stent, LCX min irregs, OM1/2 min  irregs, OM3 nl, RCA min irregs.  . Diabetes mellitus type 2, uncontrolled ([place])    a. A1c 11.0 in 07/2015.  [name] HFimpEF (heart failure with improved ejection fraction) ([place])    a. EF 25% by cath in 2013; b. Echo in 07/2015 showing EF of 15-20%, moderate MR, moderate Pulm HTN, severely dilated IVC; c. 11/2016 Echo: EF 55-60%, no rwma, Gr1 DD, mildly dil LA, nl RV fxn. d. 12/2019 Echo: EF 40-45% with mild RV dysfunction  . History of kidney stones   . Hypertension   . Ischemic cardiomyopathy   . Kidney stones    Left  . Paroxysmal atrial fibrillation ([place])    a. Dx 07/2015; b. CHA2DS2VASc = 5-->eliquis. Rhythm control w/ amiodarone.  . Pollen allergies 09-21-15   pt called and stated that he woke up and had some drainage-pt states he did not see the color of the drainage-pt denies running a fever and this only happened once-pt instructed to call Dr Audree Bane office if he starts running a fever or if the color of drainage becomes yellow/green  . Psoriasis   . Pulmonary hypertension ([place])   . Renal disorder    kidney stone  . Sleep apnea     Past Surgical History:  Procedure Laterality Date  . CARDIAC CATHETERIZATION N/A 07/25/2015   Procedure: Right/Left Heart Cath and Coronary Angiography;  Surgeon: Wellington Hampshire, MD;  Location: [facility];  Service: Cardiovascular;  Laterality: N/A;  . CARDIAC CATHE[place]IZATION     [place]  . CARDIAC CATHETERIZATION     [facility] [place]    . CYSTOSCOPY WITH STENT PLACEMENT Left 09/07/2015   Procedure: CYSTOSCOPY WITH STENT PLACEMENT;  Surgeon:  Hollice Espy, MD;  Location: [facility];  Service: Urology;  Laterality: Left;  . CYSTOSCOPY/URETEROSCOPY/HOLMIUM LASER/STENT PLACEMENT Left 10/25/2015   Procedure: CYSTOSCOPY/URETEROSCOPY/HOLMIUM LASER/STENT EXCHANGE;  Surgeon: Hollice Espy, MD;  Location: [facility];  Service: Urology;  Laterality: Left;  . CYSTOSCOPY/URETEROSCOPY/HOLMIUM LASER/STENT  PLACEMENT Left 05/11/2019   Procedure: CYSTOSCOPY/URETEROSCOPY/HOLMIUM LASER/STENT PLACEMENT;  Surgeon: Hollice Espy, MD;  Location: [facility];  Service: Urology;  Laterality: Left;  . EXTRACORPOREAL SHOCK WAVE LITHOTRIPSY Left 04/02/2019   Procedure: EXTRACORPOREAL SHOCK WAVE LITHOTRIPSY (ESWL);  Surgeon: Hollice Espy, MD;  Location: [facility];  Service: Urology;  Laterality: Left;  . EXTRACORPOREAL SHOCK WAVE LITHOTRIPSY Left 03/05/2019   Procedure: EXTRACORPOREAL SHOCK WAVE LITHOTRIPSY (ESWL);  Surgeon: Abbie Sons, MD;  Location: [facility];  Service: Urology;  Laterality: Left;  . EYE SURGERY Bilateral    LASER FOR GLAUCOMA  . KIDNEY SURGERY    . NEPHROSTOMY TUBE PLACEMENT ([place] HX) Left 11/2019  . RIGHT/LEFT HEART CATH AND CORONARY ANGIOGRAPHY N/A 07/12/2020   Procedure: RIGHT/LEFT HEART CATH AND CORONARY ANGIOGRAPHY;  Surgeon: Nelva Bush, MD;  Location: [facility];  Service: Cardiovascular;  Laterality: N/A;  . ROBOT ASSISTED LAPAROSCOPIC NEPHRECTOMY Left 12/10/2019   Procedure: XI ROBOTIC ASSISTED LAPAROSCOPIC NEPHRECTOMY;  Surgeon: Hollice Espy, MD;  Location: [facility];  Service: Urology;  Laterality: Left;  . URETEROSCOPY WITH HOLMIUM LASER LITHOTRIPSY Left 09/07/2015   Procedure: URETEROSCOPY WITH HOLMIUM LASER LITHOTRIPSY;  Surgeon: Hollice Espy, MD;  Location: [facility];  Service: Urology;  Laterality: Left;     Medications Prior to Admission: Prior to Admission medications   Medication Sig Start Date Nayleah Gamel Date Taking? Authorizing Provider  amiodarone (PACERONE) 200 MG tablet Take 1 tablet (200 mg total) by mouth daily. 08/01/15  Yes Hower, Aaron Mose, MD  apixaban (ELIQUIS) 5 MG TABS tablet Take 1 tablet (5 mg total) 2 (two) times daily by mouth. 04/17/17  Yes Mychal Durio, Harrell Gave, MD  Bromelains (BROMELAIN PO) Take 2 capsules by mouth daily.   Yes [provider]  carboxymethylcellulose (REFRESH PLUS) 0.5 % SOLN Place 1 drop into both eyes 3 (three) times  daily as needed (dry/irritated eyes).   Yes [provider]  carvedilol (COREG) 12.5 MG tablet Take 12.5 mg by mouth 2 (two) times daily with a meal.   Yes [provider]  CINNAMON PO Take 1,200 mg by mouth in the morning and at bedtime. [place] Cinnamon 1200mg    Yes [provider]  clonazePAM (KLONOPIN) 1 MG tablet Take 1 mg by mouth See admin instructions. Take 1 tablet (1 mg) by mouth scheduled at bedtime, may take an additional dose during the day if needed for anxiety 01/23/17  Yes [provider]  Coenzyme Q10 (COQ10) 400 MG CAPS Take 400 mg by mouth 2 (two) times daily.   Yes [provider]  diphenhydrAMINE (BENADRYL) 25 mg capsule Take 25 mg by mouth at bedtime.   Yes [provider]  diphenhydrAMINE-PE-APAP (MUCINEX FAST-MAX COLD FLU NGHT) 12.5-5-325 MG/10ML LIQD Take 20 mLs by mouth at bedtime as needed (congestion/sleep).   Yes [provider]  docusate sodium (COLACE) 100 MG capsule Take 1 capsule (100 mg total) by mouth 2 (two) times daily. Patient taking differently: Take 300 mg by mouth 2 (two) times daily. 03/05/19  Yes Hollice Espy, MD  glipiZIDE (GLUCOTROL) 10 MG tablet Take 10 mg by mouth 2 (two) times daily.    Yes [provider]  HAWTHORNE BERRY PO Take 565 mg by mouth at bedtime.  Yes [provider]  insulin glargine, 2 Unit Dial, (TOUJEO MAX) 300 UNIT/ML Solostar Pen Inject 28 Units into the skin daily. Hold for blood sugar <70. Titrate up to 60 units/day, per endocrinology instructions. 05/05/18  Yes [provider]  JARDIANCE 10 MG TABS tablet Take 10 mg by mouth every evening.  04/28/18  Yes [provider]  liraglutide (VICTOZA) 18 MG/3ML SOPN Inject 1.8 mg into the skin every evening. 05/24/20  Yes [provider]  Magnesium 300 MG CAPS Take 300 mg by mouth daily.   Yes [provider]  MELATONIN-THEANINE PO Take 2 capsules by mouth at bedtime. [place]  Sleep Gummies   Yes [provider]  montelukast (SINGULAIR) 10 MG tablet Take 10 mg by mouth at bedtime.  01/27/18  Yes [provider]  Multiple Vitamins-Minerals (PRESERVISION AREDS 2 PO) Take 1 tablet by mouth in the morning and at bedtime.   Yes [provider]  Phenylephrine-DM-GG-APAP (MUCINEX FAST-MAX COLD FLU) 5-10-200-325 MG/10ML LIQD Take 20 mLs by mouth daily as needed (cold/congestion).   Yes [provider]  Potassium 99 MG TABS Take 99 mg by mouth daily.   Yes [provider]  psyllium (REGULOID) 0.52 g capsule Take 2.6 g by mouth daily.   Yes [provider]  rosuvastatin (CRESTOR) 20 MG tablet Take 20 mg by mouth every evening.  01/27/19  Yes [provider]  torsemide (DEMADEX) 20 MG tablet Take 40 mg by mouth 2 (two) times daily.   Yes [provider]  Vitamin D, Ergocalciferol, (DRISDOL) 1.25 MG (50000 UT) CAPS capsule Take 50,000 Units by mouth every Monday.  02/26/18  Yes [provider]  Zinc 50 MG TABS Take 50 mg by mouth daily.   Yes [provider]     Allergies:    Allergies  Allergen Reactions  . Other Hives    Blue cheese Blue cheese Blue cheese    Social History:   Social History   Socioeconomic History  . Marital status: Single    Spouse name: Not on file  . Number of children: Not on file  . Years of education: Not on file  . Highest education level: Not on file  Occupational History  . Not on file  Tobacco Use  . Smoking status: Never Smoker  . Smokeless tobacco: Never Used  Vaping Use  . Vaping Use: Never used  Substance and Sexual Activity  . Alcohol use: Yes    Alcohol/week: 0.0 standard drinks    Comment: occasionally  . Drug use: No    Types: Marijuana, Cocaine, Opium, LSD    Comment: Past, greater than 10 years ago  . Sexual activity: Not on file  Other Topics Concern  . Not on file  Social History Narrative  . Not on file   Social  Determinants of Health   Financial Resource Strain: Not on file  Food Insecurity: Not on file  Transportation Needs: Not on file  Physical Activity: Not on file  Stress: Not on file  Social Connections: Not on file  Intimate Partner Violence: Not on file    Family History:  The patient's family history includes Heart attack in his brother; Heart failure in his father. There is no history of Kidney cancer, Bladder Cancer, or Prostate cancer.    ROS:  Please see the history of present illness. All other ROS reviewed and negative.     Physical Exam/Data:   Vitals:   07/12/20 1310 07/12/20 1340 07/12/20  1440 07/12/20 1606  BP: 114/75 112/68 108/77 105/83  Pulse: 68 67 69 71  Resp: 20 19 11 19   Temp:    97.9 F (36.6 C)  TempSrc:    Temporal  SpO2: 94% 98% 97% 96%  Weight:      Height:        Intake/Output Summary (Last 24 hours) at 07/12/2020 1922 Last data filed at 07/12/2020 1829 Gross per 24 hour  Intake 64.67 ml  Output 650 ml  Net -585.33 ml   Last 3 Weights 07/12/2020 07/08/2020 04/06/2020  Weight (lbs) 294 lb 5 oz 294 lb 4 oz 271 lb  Weight (kg) 133.5 kg 133.471 kg 122.925 kg     Body mass index is 39.92 kg/m.  General: Obese man, lying comfortably in bed. HEENT: normal Lymph: no adenopathy Neck: Difficult to assess JVP due to body habitus and facial hair. Endocrine:  No thryomegaly Vascular: No carotid bruits; FA pulses 2+ bilaterally without bruits  Cardiac: Regular rate and rhythm with 1/6 systolic murmur.  No rubs or gallops. Lungs: Mildly diminished breath sounds at both lung bases.  No wheezes or crackles. Abd: soft, nontender, no hepatomegaly  Ext: 1+ pretibial edema bilaterally. Musculoskeletal:  No deformities, BUE and BLE strength normal and equal Skin: warm and dry  Neuro:  CNs 2-12 intact, no focal abnormalities noted Psych:  Normal affect    EKG:  The ECG that was done 07/08/2020 was personally reviewed and demonstrates normal sinus rhythm with  first-degree AV block, left axis deviation, low voltage, and nonspecific interventricular conduction delay.  Relevant CV Studies: R/LHC (07/12/2020): 1. Moderate noncritical coronary artery disease involving multiple vessels, similar to the prior catheterization from 2017. 2. Severely elevated left heart, right heart, and pulmonary artery pressures. 3. Mildly reduced Fick cardiac output/index.  Limited TTE (07/08/2020): 1. Left ventricular ejection fraction, by estimation, is 20 to 25%. The  left ventricle has severely decreased function. The left ventricle  demonstrates global hypokinesis. The left ventricular internal cavity size  was moderately dilated. Left  ventricular diastolic parameters are indeterminate.  2. Right ventricular systolic function is severely reduced. The right  ventricular size is severely enlarged. There is normal pulmonary artery  systolic pressure.  3. Left atrial size was moderately dilated.  4. Mild to moderate mitral valve regurgitation.   Laboratory Data:  High Sensitivity Troponin:  No results for input(s): TROPONINIHS in the last 720 hours.    Chemistry Recent Labs  Lab 07/08/20 1525  NA 143  K 4.0  CL 99  CO2 30*  GLUCOSE 111*  BUN 32*  CREATININE 2.10*  CALCIUM 9.0  GFRNONAA 31*  GFRAA 36*    No results for input(s): PROT, ALBUMIN, AST, ALT, ALKPHOS, BILITOT in the last 168 hours. Hematology Recent Labs  Lab 07/08/20 1525  WBC 6.6  RBC 4.26  HGB 13.1  HCT 40.3  MCV 95  MCH 30.8  MCHC 32.5  RDW 16.6*  PLT 144*   BNPNo results for input(s): BNP, PROBNP in the last 168 hours.  DDimer No results for input(s): DDIMER in the last 168 hours.   Radiology/Studies:  CARDIAC CATHETERIZATION  Result Date: 07/12/2020 Conclusions: 1. Moderate noncritical coronary artery disease involving multiple vessels, similar to the prior catheterization from 2017. 2. Severely elevated left heart, right heart, and pulmonary artery pressures. 3. Mildly  reduced Fick cardiac output/index. Recommendations: 1. Admit for diuresis and optimization of goal-directed medical therapy. 2. Continue secondary prevention of coronary artery disease. Nelva Bush, MD [facility]  [facility]     Assessment and Plan:   Acute on chronic HFrEF due to nonischemic cardiomyopathy: Patient reports NYHA class III-IV heart failure symptoms that have progressed over the last 2 to 3 months.  [name] significantly volume overloaded on exam and by his right heart catheterization today.  In the setting of his CKD and worsening symptoms despite aggressive oral diuretic therapy, I think he would benefit from inpatient diuresis with close follow-up of his renal function as well as optimization of goal-directed medical therapy.  Admit for observation.  Transition from torsemide to furosemide 80 mg twice daily for target net negative fluid balance of at least 2 L in 24 hours.  Daily weights.  Decrease carvedilol to 6.25 mg twice daily in the setting of acute decompensated heart failure.  If renal function is stable, consider careful challenge with ACE inhibitor/ARB.  Continue empagliflozin.  Consider outpatient referral to advanced heart failure clinic.  Coronary artery disease: No angina reported.  Catheterization today with moderate, nonobstructive CAD similar to prior study in 2017.  Continue rosuvastatin.  Defer initiation of aspirin in the setting of long-term anticoagulation with apixaban.  Paroxysmal atrial fibrillation:  Continue amiodarone.  Check TSH and LFTs with morning labs.  If no bleeding complications, restart apixaban 5 mg twice daily this evening.  Obstructive sleep apnea:  Home CPAP settings while hospitalized.  Chronic kidney disease stage IIIb: Likely multifactorial but confounded by marked volume overload and possible cardiorenal syndrome.  Of note, only 27 mL of contrast used for today's catheterization to minimize risk for contrast-induced  nephropathy.  Diuresis as above.  Hopefully, can consider starting ACE inhibitor/ARB if renal function is stable prior to discharge.  Avoid nephrotoxic agents.   Risk Assessment/Risk Scores:   Severity of Illness: The appropriate patient status for this patient is OBSERVATION. Observation status is judged to be reasonable and necessary in order to provide the required intensity of service to ensure the patient's safety. The patient's presenting symptoms, physical exam findings, and initial radiographic and laboratory data in the context of their medical condition is felt to place them at decreased risk for further clinical deterioration. Furthermore, it is anticipated that the patient will be medically stable for discharge from the hospital within 2 midnights of admission. The following factors support the patient status of observation.   " The patient's presenting symptoms include worsening heart failure. " The physical exam findings include volume overload. " The initial radiographic and laboratory data are nonobstructive coronary artery disease and severely elevated left heart, right heart, and pulmonary artery pressures by catheterization.   For questions or updates, please contact [place] Please consult [email or web address] for contact info under [facility] Cardiology.   Signed, Nelva Bush, MD  07/12/2020 7:22 PM

## 2020-07-12 NOTE — Progress Notes (Signed)
Meal tray given and patient setup for ease of eating. Patient able to tolerate juice and gingerale without difficulty. Patient tolerating TR band and q15 min deflations without problem. No bleeding noted at this time.

## 2020-07-13 DIAGNOSIS — Z91018 Allergy to other foods: Secondary | ICD-10-CM | POA: Diagnosis not present

## 2020-07-13 DIAGNOSIS — E785 Hyperlipidemia, unspecified: Secondary | ICD-10-CM | POA: Diagnosis present

## 2020-07-13 DIAGNOSIS — N1832 Chronic kidney disease, stage 3b: Secondary | ICD-10-CM | POA: Diagnosis present

## 2020-07-13 DIAGNOSIS — I340 Nonrheumatic mitral (valve) insufficiency: Secondary | ICD-10-CM | POA: Diagnosis present

## 2020-07-13 DIAGNOSIS — I493 Ventricular premature depolarization: Secondary | ICD-10-CM | POA: Diagnosis present

## 2020-07-13 DIAGNOSIS — Z7901 Long term (current) use of anticoagulants: Secondary | ICD-10-CM | POA: Diagnosis not present

## 2020-07-13 DIAGNOSIS — Z905 Acquired absence of kidney: Secondary | ICD-10-CM | POA: Diagnosis not present

## 2020-07-13 DIAGNOSIS — Z794 Long term (current) use of insulin: Secondary | ICD-10-CM | POA: Diagnosis not present

## 2020-07-13 DIAGNOSIS — I480 Paroxysmal atrial fibrillation: Secondary | ICD-10-CM | POA: Diagnosis present

## 2020-07-13 DIAGNOSIS — Z87442 Personal history of urinary calculi: Secondary | ICD-10-CM | POA: Diagnosis not present

## 2020-07-13 DIAGNOSIS — I2510 Atherosclerotic heart disease of native coronary artery without angina pectoris: Secondary | ICD-10-CM | POA: Diagnosis not present

## 2020-07-13 DIAGNOSIS — I5023 Acute on chronic systolic (congestive) heart failure: Secondary | ICD-10-CM | POA: Diagnosis present

## 2020-07-13 DIAGNOSIS — E1122 Type 2 diabetes mellitus with diabetic chronic kidney disease: Secondary | ICD-10-CM | POA: Diagnosis present

## 2020-07-13 DIAGNOSIS — Z79890 Hormone replacement therapy (postmenopausal): Secondary | ICD-10-CM | POA: Diagnosis not present

## 2020-07-13 DIAGNOSIS — Z6841 Body Mass Index (BMI) 40.0 and over, adult: Secondary | ICD-10-CM | POA: Diagnosis not present

## 2020-07-13 DIAGNOSIS — I428 Other cardiomyopathies: Secondary | ICD-10-CM | POA: Diagnosis present

## 2020-07-13 DIAGNOSIS — I872 Venous insufficiency (chronic) (peripheral): Secondary | ICD-10-CM | POA: Diagnosis present

## 2020-07-13 DIAGNOSIS — I130 Hypertensive heart and chronic kidney disease with heart failure and stage 1 through stage 4 chronic kidney disease, or unspecified chronic kidney disease: Secondary | ICD-10-CM | POA: Diagnosis present

## 2020-07-13 DIAGNOSIS — N183 Chronic kidney disease, stage 3 unspecified: Secondary | ICD-10-CM | POA: Diagnosis not present

## 2020-07-13 DIAGNOSIS — Z79899 Other long term (current) drug therapy: Secondary | ICD-10-CM | POA: Diagnosis not present

## 2020-07-13 DIAGNOSIS — G4733 Obstructive sleep apnea (adult) (pediatric): Secondary | ICD-10-CM | POA: Diagnosis present

## 2020-07-13 DIAGNOSIS — R188 Other ascites: Secondary | ICD-10-CM | POA: Diagnosis not present

## 2020-07-13 DIAGNOSIS — R7989 Other specified abnormal findings of blood chemistry: Secondary | ICD-10-CM | POA: Diagnosis present

## 2020-07-13 DIAGNOSIS — E876 Hypokalemia: Secondary | ICD-10-CM | POA: Diagnosis present

## 2020-07-13 DIAGNOSIS — Z8249 Family history of ischemic heart disease and other diseases of the circulatory system: Secondary | ICD-10-CM | POA: Diagnosis not present

## 2020-07-13 DIAGNOSIS — I440 Atrioventricular block, first degree: Secondary | ICD-10-CM | POA: Diagnosis present

## 2020-07-13 LAB — COMPREHENSIVE METABOLIC PANEL
ALT: 24 U/L (ref 0–44)
AST: 22 U/L (ref 15–41)
Albumin: 3.4 g/dL — ABNORMAL LOW (ref 3.5–5.0)
Alkaline Phosphatase: 90 U/L (ref 38–126)
Anion gap: 13 (ref 5–15)
BUN: 29 mg/dL — ABNORMAL HIGH (ref 8–23)
CO2: 30 mmol/L (ref 22–32)
Calcium: 8.7 mg/dL — ABNORMAL LOW (ref 8.9–10.3)
Chloride: 99 mmol/L (ref 98–111)
Creatinine, Ser: 2.12 mg/dL — ABNORMAL HIGH (ref 0.61–1.24)
GFR, Estimated: 33 mL/min — ABNORMAL LOW (ref >60)
Glucose, Bld: 77 mg/dL (ref 70–99)
Potassium: 3.5 mmol/L (ref 3.5–5.1)
Sodium: 142 mmol/L (ref 135–145)
Total Bilirubin: 0.8 mg/dL (ref 0.3–1.2)
Total Protein: 6.8 g/dL (ref 6.5–8.1)

## 2020-07-13 LAB — CBC
HCT: 38.3 % — ABNORMAL LOW (ref 39.0–52.0)
Hemoglobin: 12.2 g/dL — ABNORMAL LOW (ref 13.0–17.0)
MCH: 31.6 pg (ref 26.0–34.0)
MCHC: 31.9 g/dL (ref 30.0–36.0)
MCV: 99.2 fL (ref 80.0–100.0)
Platelets: 128 10*3/uL — ABNORMAL LOW (ref 150–400)
RBC: 3.86 MIL/uL — ABNORMAL LOW (ref 4.22–5.81)
RDW: 19.4 % — ABNORMAL HIGH (ref 11.5–15.5)
WBC: 5.6 10*3/uL (ref 4.0–10.5)
nRBC: 0 % (ref 0.0–0.2)

## 2020-07-13 LAB — GLUCOSE, CAPILLARY
Glucose-Capillary: 72 mg/dL (ref 70–99)
Glucose-Capillary: 144 mg/dL — ABNORMAL HIGH (ref 70–99)
Glucose-Capillary: 174 mg/dL — ABNORMAL HIGH (ref 70–99)
Glucose-Capillary: 126 mg/dL — ABNORMAL HIGH (ref 70–99)

## 2020-07-13 LAB — TSH: TSH: 12.703 u[IU]/mL — ABNORMAL HIGH (ref 0.350–4.500)

## 2020-07-13 MED ORDER — INSULIN ASPART 100 UNIT/ML ~~LOC~~ SOLN
SUBCUTANEOUS | Status: AC
  Administered 2020-07-13: 2 [IU] via SUBCUTANEOUS
  Filled 2020-07-13: qty 1

## 2020-07-13 MED ORDER — CARVEDILOL 12.5 MG PO TABS
ORAL_TABLET | ORAL | Status: AC
  Administered 2020-07-13: 6.25 mg via ORAL
  Filled 2020-07-13: qty 1

## 2020-07-13 MED ORDER — FUROSEMIDE 10 MG/ML IJ SOLN
INTRAMUSCULAR | Status: AC
  Administered 2020-07-13: 80 mg via INTRAVENOUS
  Filled 2020-07-13: qty 8

## 2020-07-13 MED ORDER — DIPHENHYDRAMINE HCL 25 MG PO CAPS
ORAL_CAPSULE | ORAL | Status: AC
  Administered 2020-07-13: 25 mg via ORAL
  Filled 2020-07-13: qty 1

## 2020-07-13 NOTE — Progress Notes (Signed)
Progress Note  Patient Name: [name] Date of Encounter: 07/13/2020  [facility] Cardiologist: Nelva Bush, MD   Subjective   Edema slightly improved, doing okay, breathing is better.  Inpatient Medications    Scheduled Meds: . amiodarone  200 mg Oral Daily  . apixaban  5 mg Oral BID  . carvedilol  6.25 mg Oral BID WC  . diphenhydrAMINE  25 mg Oral QHS  . docusate sodium  300 mg Oral BID  . empagliflozin  10 mg Oral QPM  . furosemide  80 mg Intravenous BID  . insulin aspart  0-15 Units Subcutaneous TID WC  . insulin glargine  28 Units Subcutaneous QHS  . liraglutide  1.8 mg Subcutaneous QPM  . magnesium oxide  400 mg Oral Daily  . montelukast  10 mg Oral QHS  . psyllium  1 packet Oral Daily  . rosuvastatin  20 mg Oral QPM  . sodium chloride flush  3 mL Intravenous Q12H   Continuous Infusions: . sodium chloride     PRN Meds: sodium chloride, acetaminophen, clonazePAM, clonazePAM, ondansetron (ZOFRAN) IV, polyvinyl alcohol, sodium chloride flush   Vital Signs    Vitals:   07/13/20 0641 07/13/20 0800 07/13/20 1011 07/13/20 1200  BP: 113/80 113/80 104/70 107/71  Pulse: 75 73  74  Resp: 14 17 16 16   Temp: 97.6 F (36.4 C) 97.7 F (36.5 C)  97.7 F (36.5 C)  TempSrc:  Temporal  Temporal  SpO2: 99% 97%  97%  Weight:      Height:        Intake/Output Summary (Last 24 hours) at 07/13/2020 1353 Last data filed at 07/13/2020 1228 Gross per 24 hour  Intake 1034.67 ml  Output 1960 ml  Net -925.33 ml   Last 3 Weights 07/12/2020 07/08/2020 04/06/2020  Weight (lbs) 294 lb 5 oz 294 lb 4 oz 271 lb  Weight (kg) 133.5 kg 133.471 kg 122.925 kg      Telemetry    Sinus rhythm heart rate 72- Personally Reviewed  ECG    No new tracing- Personally Reviewed  Physical Exam   GEN: No acute distress.   Neck: No JVD Cardiac: RRR, no murmurs, rubs, or gallops.  Respiratory: Clear to auscultation bilaterally. GI: Soft, nontender, distended  MS: 1-2+ edema; No  deformity. Neuro:  Nonfocal  Psych: Normal affect   Labs    High Sensitivity Troponin:  No results for input(s): TROPONINIHS in the last 720 hours.    Chemistry Recent Labs  Lab 07/08/20 1525 07/13/20 0643  NA 143 142  K 4.0 3.5  CL 99 99  CO2 30* 30  GLUCOSE 111* 77  BUN 32* 29*  CREATININE 2.10* 2.12*  CALCIUM 9.0 8.7*  PROT  --  6.8  ALBUMIN  --  3.4*  AST  --  22  ALT  --  24  ALKPHOS  --  90  BILITOT  --  0.8  GFRNONAA 31* 33*  GFRAA 36*  --   ANIONGAP  --  13     Hematology Recent Labs  Lab 07/08/20 1525 07/13/20 0643  WBC 6.6 5.6  RBC 4.26 3.86*  HGB 13.1 12.2*  HCT 40.3 38.3*  MCV 95 99.2  MCH 30.8 31.6  MCHC 32.5 31.9  RDW 16.6* 19.4*  PLT 144* 128*    BNPNo results for input(s): BNP, PROBNP in the last 168 hours.   DDimer No results for input(s): DDIMER in the last 168 hours.   Radiology  CARDIAC CATHETERIZATION  Result Date: 07/12/2020 Conclusions: 1. Moderate noncritical coronary artery disease involving multiple vessels, similar to the prior catheterization from 2017. 2. Severely elevated left heart, right heart, and pulmonary artery pressures. 3. Mildly reduced Fick cardiac output/index. Recommendations: 1. Admit for diuresis and optimization of goal-directed medical therapy. 2. Continue secondary prevention of coronary artery disease. Nelva Bush, MD [facility]    Cardiac Studies   R/LHC (07/12/2020): 1. Moderate noncritical coronary artery disease involving multiple vessels, similar to the prior catheterization from 2017. 2. Severely elevated left heart, right heart, and pulmonary artery pressures. 3. Mildly reduced Fick cardiac output/index.  Limited TTE (07/08/2020): 1. Left ventricular ejection fraction, by estimation, is 20 to 25%. The  left ventricle has severely decreased function. The left ventricle  demonstrates global hypokinesis. The left ventricular internal cavity size  was moderately dilated. Left  ventricular  diastolic parameters are indeterminate.  2. Right ventricular systolic function is severely reduced. The right  ventricular size is severely enlarged. There is normal pulmonary artery  systolic pressure.  3. Left atrial size was moderately dilated.  4. Mild to moderate mitral valve regurgitation.  Patient Profile     70 y.o. male history of NICM, diabetes, paroxysmal atrial fibrillation, OSA on CPAP, CKD 3 presenting due to dyspnea on exertion and volume overload.  Assessment & Plan    1.  NICM, EF 25% -Net -1.4 L over the past 12 hours -Continue IV Lasix 80 mg twice daily -Goal fluid balance of negative 2 L daily -Creatinine stable -Coreg 6.25 twice daily.  Consider low-dose losartan if BP permits upon discharge.  Diuresis for now.  2.  CAD nonobstructive  -Eliquis, Crestor  3.  Diabetes -Continue PTA medications.  Total encounter time 35 minutes  Greater than 50% was spent in counseling and coordination of care with the patient     Signed, Kate Sable, MD  07/13/2020, 1:53 PM

## 2020-07-14 ENCOUNTER — Inpatient Hospital Stay: Payer: Medicare Other

## 2020-07-14 DIAGNOSIS — I25118 Atherosclerotic heart disease of native coronary artery with other forms of angina pectoris: Secondary | ICD-10-CM

## 2020-07-14 DIAGNOSIS — R188 Other ascites: Secondary | ICD-10-CM

## 2020-07-14 LAB — BASIC METABOLIC PANEL
Anion gap: 12 (ref 5–15)
BUN: 35 mg/dL — ABNORMAL HIGH (ref 8–23)
CO2: 33 mmol/L — ABNORMAL HIGH (ref 22–32)
Calcium: 8.8 mg/dL — ABNORMAL LOW (ref 8.9–10.3)
Chloride: 97 mmol/L — ABNORMAL LOW (ref 98–111)
Creatinine, Ser: 2 mg/dL — ABNORMAL HIGH (ref 0.61–1.24)
GFR, Estimated: 36 mL/min — ABNORMAL LOW (ref >60)
Glucose, Bld: 95 mg/dL (ref 70–99)
Potassium: 3.8 mmol/L (ref 3.5–5.1)
Sodium: 142 mmol/L (ref 135–145)

## 2020-07-14 LAB — GLUCOSE, CAPILLARY
Glucose-Capillary: 76 mg/dL (ref 70–99)
Glucose-Capillary: 155 mg/dL — ABNORMAL HIGH (ref 70–99)
Glucose-Capillary: 154 mg/dL — ABNORMAL HIGH (ref 70–99)
Glucose-Capillary: 145 mg/dL — ABNORMAL HIGH (ref 70–99)

## 2020-07-14 LAB — T4, FREE: Free T4: 0.95 ng/dL (ref 0.61–1.12)

## 2020-07-14 IMAGING — US US ABDOMEN LIMITED RUQ/ASCITES
1 series · 9 of 9 positions shown · non-contrast
Comparison: [DATE]

CLINICAL DATA: Abdominal distension, assess for ascites

EXAM:
LIMITED ABDOMEN ULTRASOUND FOR ASCITES
TECHNIQUE: Limited ultrasound survey for ascites was performed in all four
abdominal quadrants.

[Series 1: us ascites (abdomen limited) · 9 of 9 slices shown]
[im 1/9]
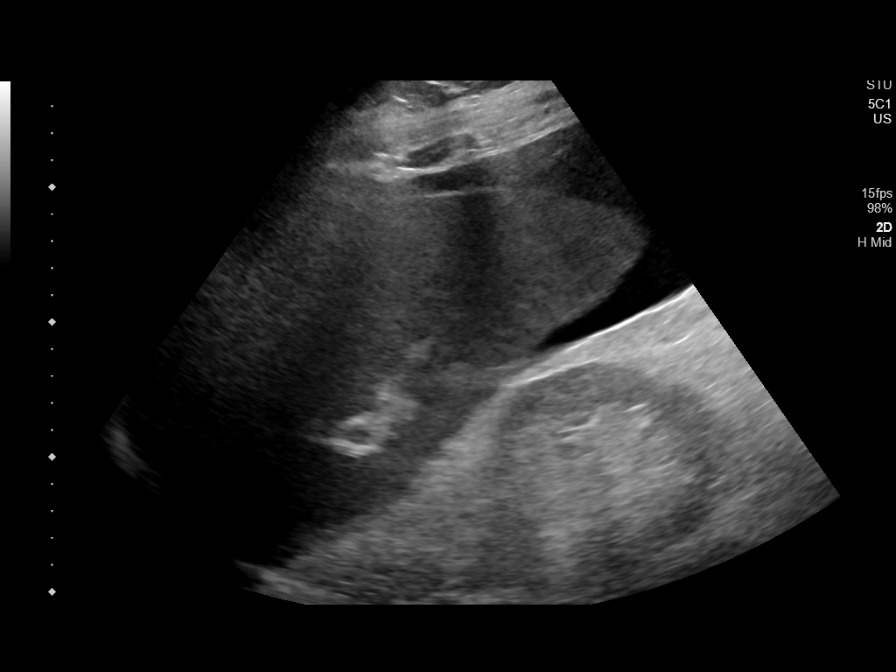
[im 2/9]
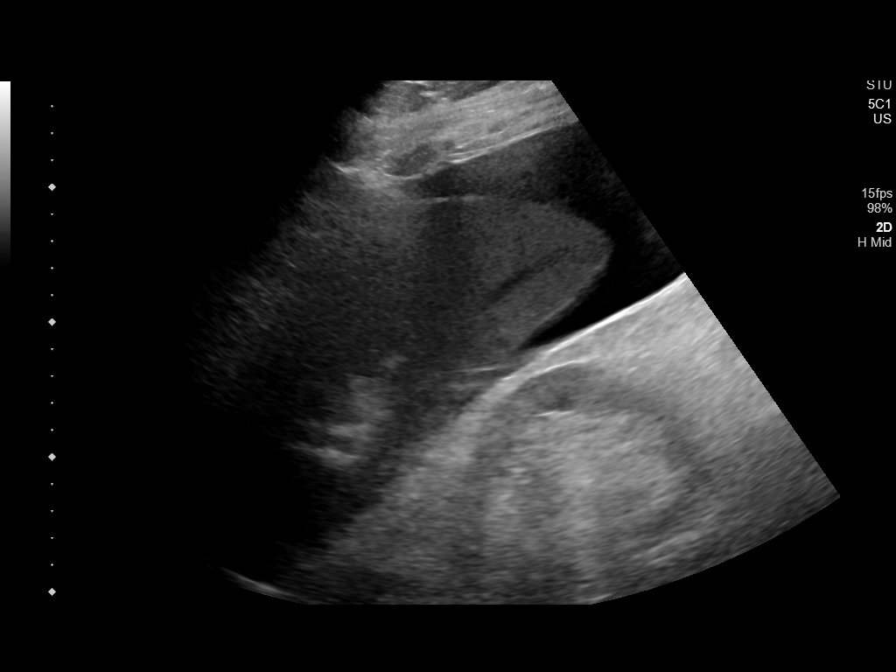
[im 3/9]
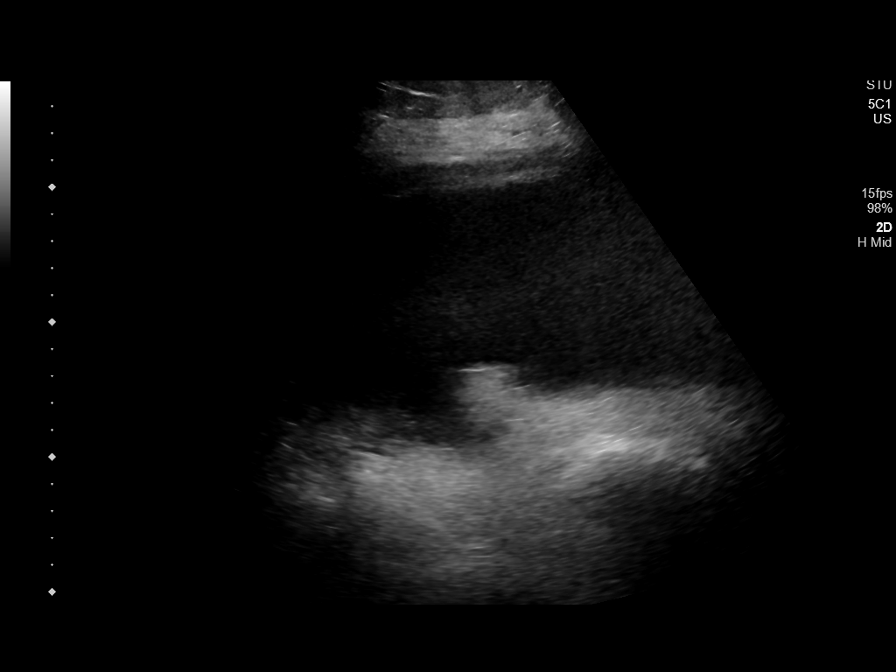
[im 4/9]
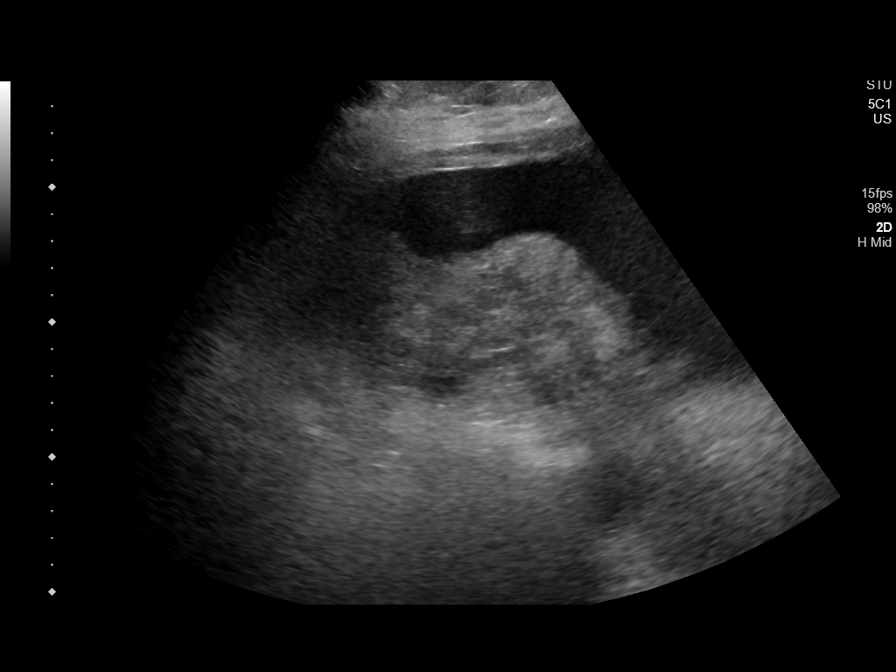
[im 5/9]
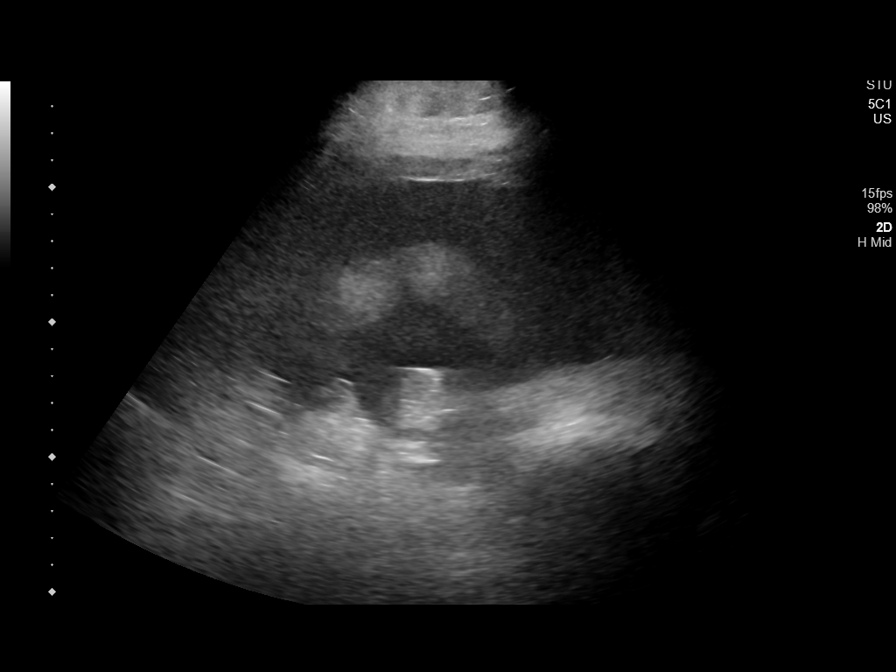
[im 6/9]
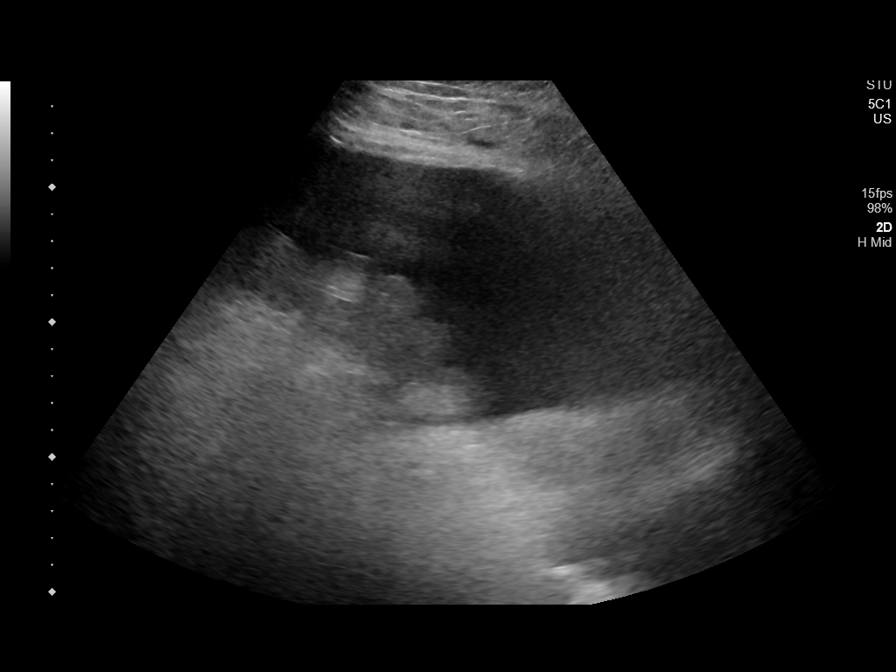
[im 7/9]
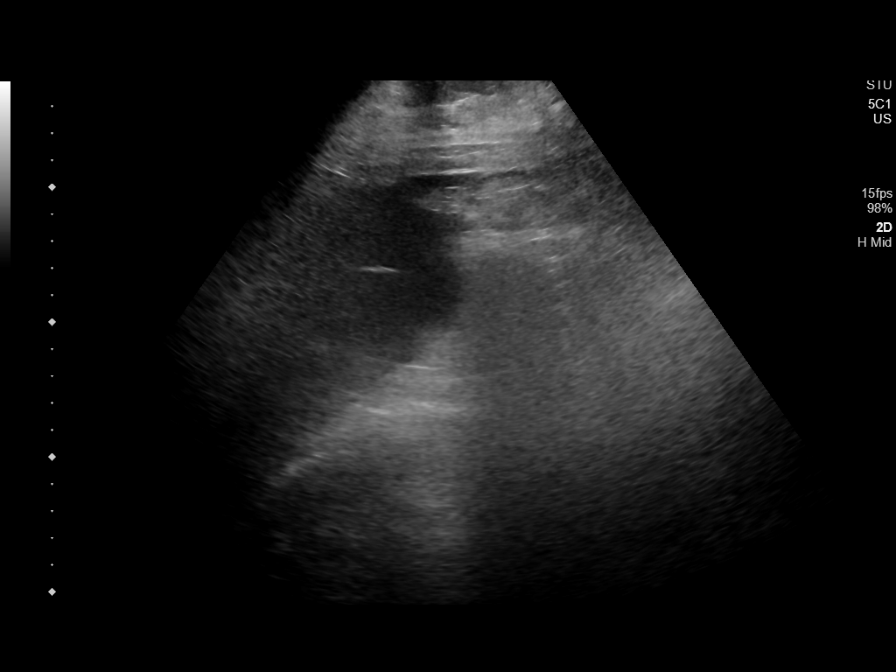
[im 8/9]
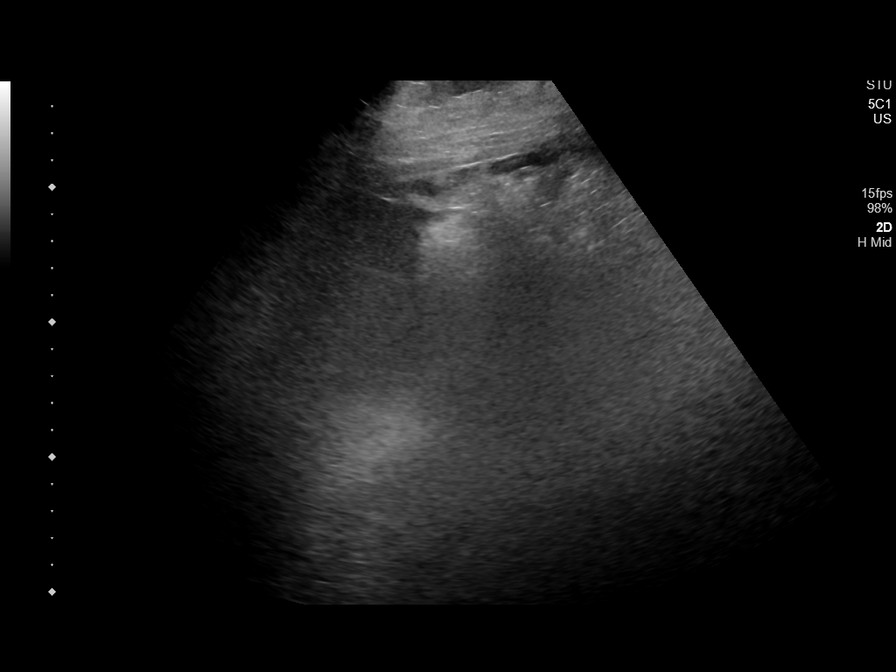
[im 9/9]
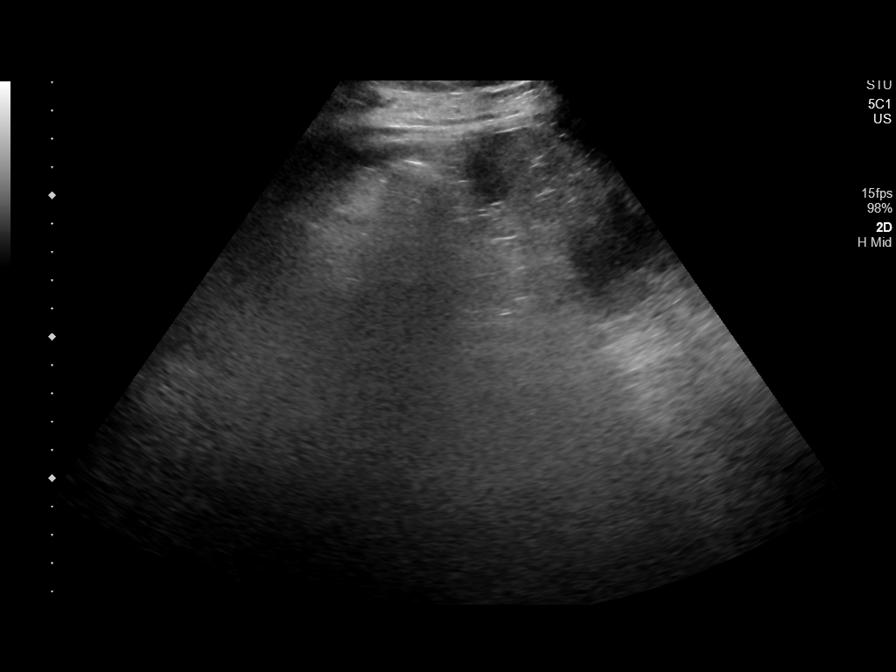

[9 of 9 positions shown; findings below may reference images not displayed]

FINDINGS: Survey of the abdominal 4 quadrants demonstrates a small to moderate
amount of ascites throughout.
IMPRESSION: Small to moderate abdominopelvic ascites

## 2020-07-14 MED ORDER — CARVEDILOL 12.5 MG PO TABS
ORAL_TABLET | ORAL | Status: AC
  Administered 2020-07-14: 6.25 mg via ORAL
  Filled 2020-07-14: qty 1

## 2020-07-14 MED ORDER — FUROSEMIDE 10 MG/ML IJ SOLN
INTRAMUSCULAR | Status: AC
  Administered 2020-07-14: 80 mg via INTRAVENOUS
  Filled 2020-07-14: qty 8

## 2020-07-14 MED ORDER — INSULIN ASPART 100 UNIT/ML ~~LOC~~ SOLN
SUBCUTANEOUS | Status: AC
  Administered 2020-07-14: 3 [IU] via SUBCUTANEOUS
  Filled 2020-07-14: qty 1

## 2020-07-14 MED ORDER — DIPHENHYDRAMINE HCL 25 MG PO CAPS
ORAL_CAPSULE | ORAL | Status: AC
  Administered 2020-07-14: 25 mg via ORAL
  Filled 2020-07-14: qty 1

## 2020-07-14 MED ORDER — INSULIN ASPART 100 UNIT/ML ~~LOC~~ SOLN
SUBCUTANEOUS | Status: AC
  Filled 2020-07-14: qty 1

## 2020-07-14 NOTE — Consult Note (Signed)
   Heart Failure Nurse Navigator Note  HFrEF by echocardiogram performed on February 4 of 2022 left ventricular ejection fraction is 20 to 25%.  The left ventricle demonstrates global hypokinesis.  The left ventricle internal cavity is mildly dilated.  Right ventricular systolic function is severely reduced.  Ventricular size is severely enlarged.  Left atrium is moderately dilated.  Mild to moderate mitral regurgitation.   He was admitted for elective right and left heart catheterization due to volume overload.  Cardiac catheterization revealed moderate noncritical coronary artery disease similar to catheterization performed in 2017.  Severely elevated left heart, right heart and pulmonary artery pressures.   Comorbidities:  Paroxysmal atrial fibrillation Chronic kidney disease stage III Hypertension Type 2 diabetes Obstructive sleep apnea compliant with CPAP  Medications:  Apixaban 5 mg 2 times a day Carvedilol 6.25 mg twice daily Jardiance 10 mg every evening Lasix 80 mg IV 2 times a day Crestor 20 mg daily  Labs:  Sodium 142, potassium 3.8, chloride 97, CO2 33, BUN 35, creatinine 2.0, GFR 36, TSH 12.7, free T4 0.95, free T3 is pending Weight 133.9 kg BMI 40 Blood pressure 119/81 Intake 970 mL Output 2250 mL  Assessment:  General-he is awake and alert sitting up in the chair at bedside.  HEENT-pupils are equal, nonicteric, unable to evaluate for JVD.  Cardiac-heart tones of regular rate and rhythm.  Chest-breath sounds are diminished in the bases  Abdomen-obese, soft nontender  Musculoskeletal-lower extremities with pitting edema, skin discoloration significant for venous insufficiency.   Psych-is pleasant and appropriate makes good eye contact.  Neurologic-moves all extremities without difficulty, speech is clear.   Initial meeting with patient in the preop area.  He states that he does not weigh himself daily as he does not have a scale.  Discussed the  importance of daily weights and recording, what to report to physician.  Will contact TOC to get the patient a scale.  Discussed diet he states that he does not use canned foods, admits to using pink Himalayan salt on his salads.  Also discussed fluid intake and he states that he drinks about 2 L of water a day.  He was given living with heart failure teaching booklet, zone magnet and information about the outpatient heart failure clinic with an appointment.  Will continue to follow along.  Pricilla Riffle RN, CHFN

## 2020-07-14 NOTE — Progress Notes (Signed)
Progress Note  Patient Name: [name] Date of Encounter: 07/14/2020  [facility] Cardiologist: Nelva Bush, MD   Subjective   UOP is picking up. SOB slightly improved. Documented UOP 1.2 L for the past 24 hours with a net - 2.4 L for the admission. Weight 133.9 kg today, up from 133.6 kg 2/8. Renal function stable. He remains 20-30 pounds up.   Inpatient Medications    Scheduled Meds: . amiodarone  200 mg Oral Daily  . apixaban  5 mg Oral BID  . carvedilol  6.25 mg Oral BID WC  . diphenhydrAMINE  25 mg Oral QHS  . docusate sodium  300 mg Oral BID  . empagliflozin  10 mg Oral QPM  . furosemide  80 mg Intravenous BID  . insulin aspart  0-15 Units Subcutaneous TID WC  . insulin glargine  28 Units Subcutaneous QHS  . liraglutide  1.8 mg Subcutaneous QPM  . magnesium oxide  400 mg Oral Daily  . montelukast  10 mg Oral QHS  . psyllium  1 packet Oral Daily  . rosuvastatin  20 mg Oral QPM  . sodium chloride flush  3 mL Intravenous Q12H   Continuous Infusions: . sodium chloride     PRN Meds: sodium chloride, acetaminophen, clonazePAM, clonazePAM, ondansetron (ZOFRAN) IV, polyvinyl alcohol, sodium chloride flush   Vital Signs    Vitals:   07/14/20 0100 07/14/20 0400 07/14/20 0712 07/14/20 0728  BP:  115/73  119/81  Pulse: 71 73  70  Resp: 16 15  18   Temp:  (!) 97.3 F (36.3 C)    TempSrc:  Temporal    SpO2: 96% 99%  100%  Weight:   133.9 kg   Height:        Intake/Output Summary (Last 24 hours) at 07/14/2020 0843 Last data filed at 07/14/2020 0810 Gross per 24 hour  Intake 1210 ml  Output 2250 ml  Net -1040 ml   Last 3 Weights 07/14/2020 07/12/2020 07/08/2020  Weight (lbs) 295 lb 3.2 oz 294 lb 5 oz 294 lb 4 oz  Weight (kg) 133.902 kg 133.5 kg 133.471 kg      Telemetry    SR - Personally Reviewed  ECG    No new tracing- Personally Reviewed  Physical Exam   GEN: No acute distress.   Neck: No JVD Cardiac: RRR, no murmurs, rubs, or gallops.   Respiratory: Diminished breath sounds along the bilateral bases. GI: Soft, nontender, distended  MS: 1-2+ edema along the lower extremities to the thighs with sacral edema noted ; No deformity. Neuro:  Nonfocal  Psych: Normal affect   Labs    High Sensitivity Troponin:  No results for input(s): TROPONINIHS in the last 720 hours.    Chemistry Recent Labs  Lab 07/08/20 1525 07/13/20 0643 07/14/20 0534  NA 143 142 142  K 4.0 3.5 3.8  CL 99 99 97*  CO2 30* 30 33*  GLUCOSE 111* 77 95  BUN 32* 29* 35*  CREATININE 2.10* 2.12* 2.00*  CALCIUM 9.0 8.7* 8.8*  PROT  --  6.8  --   ALBUMIN  --  3.4*  --   AST  --  22  --   ALT  --  24  --   ALKPHOS  --  90  --   BILITOT  --  0.8  --   GFRNONAA 31* 33* 36*  GFRAA 36*  --   --   ANIONGAP  --  13 12  Hematology Recent Labs  Lab 07/08/20 1525 07/13/20 0643  WBC 6.6 5.6  RBC 4.26 3.86*  HGB 13.1 12.2*  HCT 40.3 38.3*  MCV 95 99.2  MCH 30.8 31.6  MCHC 32.5 31.9  RDW 16.6* 19.4*  PLT 144* 128*    BNPNo results for input(s): BNP, PROBNP in the last 168 hours.   DDimer No results for input(s): DDIMER in the last 168 hours.   Radiology    N/A  Cardiac Studies   R/LHC (07/12/2020): 1. Moderate noncritical coronary artery disease involving multiple vessels, similar to the prior catheterization from 2017. 2. Severely elevated left heart, right heart, and pulmonary artery pressures. 3. Mildly reduced Fick cardiac output/index.  Limited TTE (07/08/2020): 1. Left ventricular ejection fraction, by estimation, is 20 to 25%. The  left ventricle has severely decreased function. The left ventricle  demonstrates global hypokinesis. The left ventricular internal cavity size  was moderately dilated. Left  ventricular diastolic parameters are indeterminate.  2. Right ventricular systolic function is severely reduced. The right  ventricular size is severely enlarged. There is normal pulmonary artery  systolic pressure.  3.  Left atrial size was moderately dilated.  4. Mild to moderate mitral valve regurgitation.  Patient Profile     70 y.o. male history of nonobstructive CAD, HFrEF secondary to NICM, diabetes, PAF on Eliquis, OSA on CPAP, CKD stage III, and HTN presenting due to dyspnea on exertion and volume overload.  Assessment & Plan    1.  HFrEF secondary to NICM with an EF of 25% with ascites: -Net -2.4 L over the admission -Weight this morning of 295 pounds with a 271 pounds in 04/2020 -Continue IV Lasix 80 mg twice daily -Goal fluid balance of negative 2 L daily -Creatinine stable -Coreg 6.25 twice daily -Jardiance 10 mg   -IV Lasix 80 mg bid, could consider Lasix gtt 2/11  -Abdominal ultrasound ordered -Not currently on ARB/ARNI/ARB with relative hypotension and CKD, escalate GDMT as able -CHF education -Daily weights, strictt I/O -QRS complex appears wider on most recent EKG when compared to prior, if this persists, consider EP referral in the context of his cardiomyopathy   2.  CAD nonobstructive: -LHC 07/2020 as above with noncritical disease -Eliquis in place of ASA given PAF in an effort to minimize bleeding risk -Coreg -Crestor  3. PAF: -Maintaining sinus rhythm -CHADS2VASc 5 (CHF, HTN, age x 1, DM, vascular disease) -Eliquis -Coreg -Hold amiodarone with abnormal thyroid function  -AST/ALT normal   4.  Diabetes -Continue PTA medications  5. Abnormal TSH: -Hold amiodarone  -Add free T4/T3 -Needs outpatient follow up with PCP   6. Acute on CKD stage III: -SCr improving   Total encounter time 35 minutes  Greater than 50% was spent in counseling and coordination of care with the patient     Signed, Christell Faith, PA-C  07/14/2020, 8:43 AM

## 2020-07-15 LAB — BASIC METABOLIC PANEL
Anion gap: 12 (ref 5–15)
BUN: 39 mg/dL — ABNORMAL HIGH (ref 8–23)
CO2: 32 mmol/L (ref 22–32)
Calcium: 8.7 mg/dL — ABNORMAL LOW (ref 8.9–10.3)
Chloride: 97 mmol/L — ABNORMAL LOW (ref 98–111)
Creatinine, Ser: 2.1 mg/dL — ABNORMAL HIGH (ref 0.61–1.24)
GFR, Estimated: 34 mL/min — ABNORMAL LOW (ref >60)
Glucose, Bld: 155 mg/dL — ABNORMAL HIGH (ref 70–99)
Potassium: 3.7 mmol/L (ref 3.5–5.1)
Sodium: 141 mmol/L (ref 135–145)

## 2020-07-15 LAB — T3, FREE: T3, Free: 2 pg/mL (ref 2.0–4.4)

## 2020-07-15 LAB — GLUCOSE, CAPILLARY
Glucose-Capillary: 99 mg/dL (ref 70–99)
Glucose-Capillary: 125 mg/dL — ABNORMAL HIGH (ref 70–99)
Glucose-Capillary: 131 mg/dL — ABNORMAL HIGH (ref 70–99)
Glucose-Capillary: 113 mg/dL — ABNORMAL HIGH (ref 70–99)

## 2020-07-15 MED ORDER — FUROSEMIDE 10 MG/ML IJ SOLN
80.0000 mg | Freq: Two times a day (BID) | INTRAMUSCULAR | Status: DC
  Administered 2020-07-16 – 2020-07-18 (×5): 80 mg via INTRAVENOUS
  Filled 2020-07-15 (×5): qty 8

## 2020-07-15 MED ORDER — POTASSIUM CHLORIDE CRYS ER 20 MEQ PO TBCR
EXTENDED_RELEASE_TABLET | ORAL | Status: AC
  Administered 2020-07-15: 40 meq via ORAL
  Filled 2020-07-15: qty 2

## 2020-07-15 MED ORDER — ACETAMINOPHEN 325 MG PO TABS
ORAL_TABLET | ORAL | Status: AC
  Administered 2020-07-15: 650 mg via ORAL
  Filled 2020-07-15: qty 2

## 2020-07-15 MED ORDER — METOLAZONE 2.5 MG PO TABS
2.5000 mg | ORAL_TABLET | Freq: Every day | ORAL | Status: DC
  Administered 2020-07-15 – 2020-07-18 (×4): 2.5 mg via ORAL
  Filled 2020-07-15 (×4): qty 1

## 2020-07-15 MED ORDER — FUROSEMIDE 10 MG/ML IJ SOLN
INTRAMUSCULAR | Status: AC
  Administered 2020-07-15: 80 mg via INTRAVENOUS
  Filled 2020-07-15: qty 8

## 2020-07-15 MED ORDER — POTASSIUM CHLORIDE CRYS ER 20 MEQ PO TBCR: 40.0000 meq | EXTENDED_RELEASE_TABLET | Freq: Once | ORAL | Status: AC

## 2020-07-15 MED ORDER — FUROSEMIDE 10 MG/ML IJ SOLN
INTRAMUSCULAR | Status: AC
  Filled 2020-07-15: qty 8

## 2020-07-15 MED ORDER — INSULIN ASPART 100 UNIT/ML ~~LOC~~ SOLN
SUBCUTANEOUS | Status: AC
  Filled 2020-07-15: qty 1

## 2020-07-15 MED ORDER — SODIUM CHLORIDE FLUSH 0.9 % IV SOLN
INTRAVENOUS | Status: AC
  Filled 2020-07-15: qty 10

## 2020-07-15 NOTE — Progress Notes (Signed)
Progress Note  Patient Name: [name] Date of Encounter: 07/15/2020  [facility] Cardiologist: Nelva Bush, MD   Subjective   UOP is picking up. SOB slightly improved. Documented UOP 1.8 L for the past 24 hours with a net - 4.4 L for the admission. Weight largely unchanged at 134 kg today. Renal function stable. He remains 20-30 pounds up.   Inpatient Medications    Scheduled Meds: . apixaban  5 mg Oral BID  . carvedilol  6.25 mg Oral BID WC  . diphenhydrAMINE  25 mg Oral QHS  . docusate sodium  300 mg Oral BID  . empagliflozin  10 mg Oral QPM  . furosemide  80 mg Intravenous BID  . insulin aspart  0-15 Units Subcutaneous TID WC  . insulin glargine  28 Units Subcutaneous QHS  . liraglutide  1.8 mg Subcutaneous QPM  . magnesium oxide  400 mg Oral Daily  . metolazone  2.5 mg Oral Daily  . montelukast  10 mg Oral QHS  . psyllium  1 packet Oral Daily  . rosuvastatin  20 mg Oral QPM  . sodium chloride flush  3 mL Intravenous Q12H   Continuous Infusions: . sodium chloride     PRN Meds: sodium chloride, acetaminophen, clonazePAM, clonazePAM, ondansetron (ZOFRAN) IV, polyvinyl alcohol, sodium chloride flush   Vital Signs    Vitals:   07/15/20 0000 07/15/20 0400 07/15/20 0500 07/15/20 0735  BP:  138/88  130/86  Pulse: 77 81  78  Resp: 12 16  (!) 22  Temp:  (!) 97.2 F (36.2 C)  97.7 F (36.5 C)  TempSrc:  Temporal    SpO2: 95% 97%  94%  Weight:   134 kg   Height:        Intake/Output Summary (Last 24 hours) at 07/15/2020 1028 Last data filed at 07/15/2020 0941 Gross per 24 hour  Intake 1450 ml  Output 3100 ml  Net -1650 ml   Last 3 Weights 07/15/2020 07/14/2020 07/12/2020  Weight (lbs) 295 lb 8 oz 295 lb 3.2 oz 294 lb 5 oz  Weight (kg) 134.038 kg 133.902 kg 133.5 kg      Telemetry    SR - Personally Reviewed  ECG    No new tracing- Personally Reviewed  Physical Exam   GEN: No acute distress.   Neck: No JVD Cardiac: RRR, no murmurs, rubs,  or gallops.  Respiratory: Diminished breath sounds along the bilateral bases. GI: Soft, nontender, distended  MS: 1-2+ edema along the lower extremities to the thighs with sacral edema noted ; No deformity. Neuro:  Nonfocal  Psych: Normal affect   Labs    High Sensitivity Troponin:  No results for input(s): TROPONINIHS in the last 720 hours.    Chemistry Recent Labs  Lab 07/08/20 1525 07/13/20 0643 07/14/20 0534 07/15/20 0311  NA 143 142 142 141  K 4.0 3.5 3.8 3.7  CL 99 99 97* 97*  CO2 30* 30 33* 32  GLUCOSE 111* 77 95 155*  BUN 32* 29* 35* 39*  CREATININE 2.10* 2.12* 2.00* 2.10*  CALCIUM 9.0 8.7* 8.8* 8.7*  PROT  --  6.8  --   --   ALBUMIN  --  3.4*  --   --   AST  --  22  --   --   ALT  --  24  --   --   ALKPHOS  --  90  --   --   BILITOT  --  0.8  --   --  GFRNONAA 31* 33* 36* 34*  GFRAA 36*  --   --   --   ANIONGAP  --  13 12 12      Hematology Recent Labs  Lab 07/08/20 1525 07/13/20 0643  WBC 6.6 5.6  RBC 4.26 3.86*  HGB 13.1 12.2*  HCT 40.3 38.3*  MCV 95 99.2  MCH 30.8 31.6  MCHC 32.5 31.9  RDW 16.6* 19.4*  PLT 144* 128*    BNPNo results for input(s): BNP, PROBNP in the last 168 hours.   DDimer No results for input(s): DDIMER in the last 168 hours.   Radiology    N/A  Cardiac Studies   R/LHC (07/12/2020): 1. Moderate noncritical coronary artery disease involving multiple vessels, similar to the prior catheterization from 2017. 2. Severely elevated left heart, right heart, and pulmonary artery pressures. 3. Mildly reduced Fick cardiac output/index.  Limited TTE (07/08/2020): 1. Left ventricular ejection fraction, by estimation, is 20 to 25%. The  left ventricle has severely decreased function. The left ventricle  demonstrates global hypokinesis. The left ventricular internal cavity size  was moderately dilated. Left  ventricular diastolic parameters are indeterminate.  2. Right ventricular systolic function is severely reduced. The right   ventricular size is severely enlarged. There is normal pulmonary artery  systolic pressure.  3. Left atrial size was moderately dilated.  4. Mild to moderate mitral valve regurgitation.  Patient Profile     70 y.o. male history of nonobstructive CAD, HFrEF secondary to NICM, diabetes, PAF on Eliquis, OSA on CPAP, CKD stage III, and HTN presenting due to dyspnea on exertion and volume overload.  Assessment & Plan    1.  HFrEF secondary to NICM with an EF of 25% with ascites: -Net -4.4 L over the admission -Weight this morning of 295 pounds with a weight of 271 pounds in 04/2020 -Continue IV Lasix 80 mg twice daily -Add metolazone 2.5 mg daily -Goal fluid balance of negative 2 L daily -Creatinine stable -Coreg 6.25 twice daily -Jardiance 10 mg   -Abdominal ultrasound without significant ascites  -Not currently on ARB/ARNI/ARB with relative hypotension and CKD, escalate GDMT as able -CHF education -Daily weights, strictt I/O -QRS complex appears wider on most recent EKG when compared to prior, if this persists, consider EP referral in the context of his cardiomyopathy   2.  CAD nonobstructive: -LHC 07/2020 as above with noncritical disease -Eliquis in place of ASA given PAF in an effort to minimize bleeding risk -Coreg -Crestor  3. PAF: -Maintaining sinus rhythm -CHADS2VASc 5 (CHF, HTN, age x 1, DM, vascular disease) -Eliquis -Coreg -Hold amiodarone with abnormal thyroid function  -AST/ALT normal   4.  Diabetes -Continue PTA medications  5. Abnormal TSH: -Amiodarone held as of 2/10 -Free T4/T3 normal -Needs outpatient follow up with PCP   6. CKD stage III: -SCr stable     Signed, Christell Faith, PA-C  07/15/2020, 10:28 AM

## 2020-07-15 NOTE — Care Management Important Message (Signed)
Important Message  Patient Details  Name: [name] MRN: [id number] Date of Birth: 1950-08-13   Medicare Important Message Given:  N/A - LOS <3 / Initial given by admissions  Initial Medicare IM reviewed with patient by Meredith Mody, Patient Access Associate on 07/14/2020 at 9:47am.   Dannette Barbara 07/15/2020, 9:10 AM

## 2020-07-16 LAB — BASIC METABOLIC PANEL
Anion gap: 14 (ref 5–15)
BUN: 38 mg/dL — ABNORMAL HIGH (ref 8–23)
CO2: 35 mmol/L — ABNORMAL HIGH (ref 22–32)
Calcium: 9.1 mg/dL (ref 8.9–10.3)
Chloride: 92 mmol/L — ABNORMAL LOW (ref 98–111)
Creatinine, Ser: 1.84 mg/dL — ABNORMAL HIGH (ref 0.61–1.24)
GFR, Estimated: 39 mL/min — ABNORMAL LOW (ref >60)
Glucose, Bld: 88 mg/dL (ref 70–99)
Potassium: 2.9 mmol/L — ABNORMAL LOW (ref 3.5–5.1)
Sodium: 141 mmol/L (ref 135–145)

## 2020-07-16 LAB — GLUCOSE, CAPILLARY
Glucose-Capillary: 86 mg/dL (ref 70–99)
Glucose-Capillary: 176 mg/dL — ABNORMAL HIGH (ref 70–99)
Glucose-Capillary: 133 mg/dL — ABNORMAL HIGH (ref 70–99)
Glucose-Capillary: 148 mg/dL — ABNORMAL HIGH (ref 70–99)

## 2020-07-16 NOTE — Progress Notes (Signed)
Progress Note  Patient Name: [name] Date of Encounter: 07/16/2020  [facility] Cardiologist: Nelva Bush, MD   Subjective   Doing well but still volume overloaded Sitting in chair reading Kindle   Inpatient Medications    Scheduled Meds: . apixaban  5 mg Oral BID  . carvedilol  6.25 mg Oral BID WC  . diphenhydrAMINE  25 mg Oral QHS  . docusate sodium  300 mg Oral BID  . empagliflozin  10 mg Oral QPM  . furosemide  80 mg Intravenous BID  . insulin aspart  0-15 Units Subcutaneous TID WC  . insulin glargine  28 Units Subcutaneous QHS  . liraglutide  1.8 mg Subcutaneous QPM  . magnesium oxide  400 mg Oral Daily  . metolazone  2.5 mg Oral Daily  . montelukast  10 mg Oral QHS  . psyllium  1 packet Oral Daily  . rosuvastatin  20 mg Oral QPM  . sodium chloride flush  3 mL Intravenous Q12H   Continuous Infusions: . sodium chloride     PRN Meds: sodium chloride, acetaminophen, clonazePAM, clonazePAM, ondansetron (ZOFRAN) IV, polyvinyl alcohol, sodium chloride flush   Vital Signs    Vitals:   07/15/20 2010 07/15/20 2029 07/16/20 0358 07/16/20 0752  BP:  132/83 122/90 134/79  Pulse:  79 77 82  Resp:  (!) 24 (!) 21 14  Temp:  97.9 F (36.6 C) (!) 97.5 F (36.4 C) 98.1 F (36.7 C)  TempSrc:  Oral Oral Oral  SpO2: 96% 96% 100% 92%  Weight:      Height:        Intake/Output Summary (Last 24 hours) at 07/16/2020 0947 Last data filed at 07/16/2020 3500 Gross per 24 hour  Intake 1680 ml  Output 3015 ml  Net -1335 ml   Last 3 Weights 07/15/2020 07/14/2020 07/12/2020  Weight (lbs) 295 lb 8 oz 295 lb 3.2 oz 294 lb 5 oz  Weight (kg) 134.038 kg 133.902 kg 133.5 kg      Telemetry    SR PVC;s 07/16/2020   ECG    No new tracing- Personally Reviewed  Physical Exam   Obese white male Lungs clear Distant heart sounds Abdomen benign Plus 2 firm LE edema from knees down   Labs    High Sensitivity Troponin:  No results for input(s): TROPONINIHS in the  last 720 hours.    Chemistry Recent Labs  Lab 07/13/20 [phone] 07/14/20 0534 07/15/20 0311 07/16/20 0416  NA 142 142 141 141  K 3.5 3.8 3.7 2.9*  CL 99 97* 97* 92*  CO2 30 33* 32 35*  GLUCOSE 77 95 155* 88  BUN 29* 35* 39* 38*  CREATININE 2.12* 2.00* 2.10* 1.84*  CALCIUM 8.7* 8.8* 8.7* 9.1  PROT 6.8  --   --   --   ALBUMIN 3.4*  --   --   --   AST 22  --   --   --   ALT 24  --   --   --   ALKPHOS 90  --   --   --   BILITOT 0.8  --   --   --   GFRNONAA 33* 36* 34* 39*  ANIONGAP 13 12 12 14      Hematology Recent Labs  Lab 07/13/20 0643  WBC 5.6  RBC 3.86*  HGB 12.2*  HCT 38.3*  MCV 99.2  MCH 31.6  MCHC 31.9  RDW 19.4*  PLT 128*    BNPNo results for  input(s): BNP, PROBNP in the last 168 hours.   DDimer No results for input(s): DDIMER in the last 168 hours.   Radiology    N/A  Cardiac Studies   R/LHC (07/12/2020): 1. Moderate noncritical coronary artery disease involving multiple vessels, similar to the prior catheterization from 2017. 2. Severely elevated left heart, right heart, and pulmonary artery pressures. 3. Mildly reduced Fick cardiac output/index.  Limited TTE (07/08/2020): 1. Left ventricular ejection fraction, by estimation, is 20 to 25%. The  left ventricle has severely decreased function. The left ventricle  demonstrates global hypokinesis. The left ventricular internal cavity size  was moderately dilated. Left  ventricular diastolic parameters are indeterminate.  2. Right ventricular systolic function is severely reduced. The right  ventricular size is severely enlarged. There is normal pulmonary artery  systolic pressure.  3. Left atrial size was moderately dilated.  4. Mild to moderate mitral valve regurgitation.  Patient Profile     70 y.o. male history of nonobstructive CAD, HFrEF secondary to NICM, diabetes, PAF on Eliquis, OSA on CPAP, CKD stage III, and HTN presenting due to dyspnea on exertion and volume overload.  Assessment &  Plan    1.  HFrEF EF 25%  -Net -4.4 L over the admission - I/O negative although weights not changing  -Continue IV Lasix 80 mg twice daily -Zaroxyln 2.5 mg daily  -still need 3-4 days more iv diuresis - follow Cr closely with single kideny - Outpatient advance CHF consult   2.  CAD nonobstructive: -LHC 07/2020 as above with noncritical disease -Eliquis in place of ASA given PAF in an effort to minimize bleeding risk -Coreg -Crestor  3. PAF: -Tele in NSR  -CHADS2VASc 5 (CHF, HTN, age x 1, DM, vascular disease) -Eliquis -Coreg -amiodarone d/c abnormal TFTS  -AST/ALT normal   4.  Diabetes -Continue PTA medications  5 CKD stage III: -SCr stable single kidney consider low dose ACE/ARB challenge once diuresed      Signed, Jenkins Rouge, MD  07/16/2020, 9:47 AM

## 2020-07-16 NOTE — Progress Notes (Signed)
Patient has refused cpap. States is doing fine on oxygen. He states his unit at home is broken. Wanted to know now could he replace. I referred him to hospital case workers for help

## 2020-07-17 LAB — BASIC METABOLIC PANEL
Anion gap: 16 — ABNORMAL HIGH (ref 5–15)
BUN: 42 mg/dL — ABNORMAL HIGH (ref 8–23)
CO2: 37 mmol/L — ABNORMAL HIGH (ref 22–32)
Calcium: 9.1 mg/dL (ref 8.9–10.3)
Chloride: 87 mmol/L — ABNORMAL LOW (ref 98–111)
Creatinine, Ser: 1.71 mg/dL — ABNORMAL HIGH (ref 0.61–1.24)
GFR, Estimated: 43 mL/min — ABNORMAL LOW (ref >60)
Glucose, Bld: 127 mg/dL — ABNORMAL HIGH (ref 70–99)
Potassium: 2.6 mmol/L — CL (ref 3.5–5.1)
Sodium: 140 mmol/L (ref 135–145)

## 2020-07-17 LAB — GLUCOSE, CAPILLARY
Glucose-Capillary: 101 mg/dL — ABNORMAL HIGH (ref 70–99)
Glucose-Capillary: 105 mg/dL — ABNORMAL HIGH (ref 70–99)
Glucose-Capillary: 182 mg/dL — ABNORMAL HIGH (ref 70–99)
Glucose-Capillary: 144 mg/dL — ABNORMAL HIGH (ref 70–99)
Glucose-Capillary: 174 mg/dL — ABNORMAL HIGH (ref 70–99)

## 2020-07-17 LAB — CBC
HCT: 40.5 % (ref 39.0–52.0)
Hemoglobin: 13.3 g/dL (ref 13.0–17.0)
MCH: 31.7 pg (ref 26.0–34.0)
MCHC: 32.8 g/dL (ref 30.0–36.0)
MCV: 96.7 fL (ref 80.0–100.0)
Platelets: 124 10*3/uL — ABNORMAL LOW (ref 150–400)
RBC: 4.19 MIL/uL — ABNORMAL LOW (ref 4.22–5.81)
RDW: 18.3 % — ABNORMAL HIGH (ref 11.5–15.5)
WBC: 5.1 10*3/uL (ref 4.0–10.5)
nRBC: 0 % (ref 0.0–0.2)

## 2020-07-17 MED ORDER — POTASSIUM CITRATE ER 10 MEQ (1080 MG) PO TBCR
40.0000 meq | EXTENDED_RELEASE_TABLET | Freq: Once | ORAL | Status: AC
  Administered 2020-07-17: 40 meq via ORAL
  Filled 2020-07-17: qty 4

## 2020-07-17 MED ORDER — POTASSIUM CITRATE ER 10 MEQ (1080 MG) PO TBCR: 10.0000 meq | EXTENDED_RELEASE_TABLET | Freq: Three times a day (TID) | ORAL | Status: DC

## 2020-07-17 NOTE — Progress Notes (Signed)
Progress Note  Patient Name: [name] Date of Encounter: 07/17/2020  [facility] Cardiologist: Nelva Bush, MD   Subjective   Continues to improve ambulatory in room   Inpatient Medications    Scheduled Meds: . apixaban  5 mg Oral BID  . carvedilol  6.25 mg Oral BID WC  . diphenhydrAMINE  25 mg Oral QHS  . docusate sodium  300 mg Oral BID  . empagliflozin  10 mg Oral QPM  . furosemide  80 mg Intravenous BID  . insulin aspart  0-15 Units Subcutaneous TID WC  . insulin glargine  28 Units Subcutaneous QHS  . liraglutide  1.8 mg Subcutaneous QPM  . magnesium oxide  400 mg Oral Daily  . metolazone  2.5 mg Oral Daily  . montelukast  10 mg Oral QHS  . psyllium  1 packet Oral Daily  . rosuvastatin  20 mg Oral QPM  . sodium chloride flush  3 mL Intravenous Q12H   Continuous Infusions: . sodium chloride     PRN Meds: sodium chloride, acetaminophen, clonazePAM, clonazePAM, ondansetron (ZOFRAN) IV, polyvinyl alcohol, sodium chloride flush   Vital Signs    Vitals:   07/16/20 2359 07/17/20 0509 07/17/20 0827 07/17/20 1204  BP: 116/79 121/76 125/73 120/76  Pulse: 78 77 80 77  Resp: 20 19 16 16   Temp: 97.7 F (36.5 C) 97.8 F (36.6 C) 97.7 F (36.5 C) 98.3 F (36.8 C)  TempSrc: Oral Oral Oral Oral  SpO2: 97% 91% 90% 96%  Weight:      Height:        Intake/Output Summary (Last 24 hours) at 07/17/2020 1209 Last data filed at 07/17/2020 1034 Gross per 24 hour  Intake 2287 ml  Output 5850 ml  Net -3563 ml   Last 3 Weights 07/15/2020 07/14/2020 07/12/2020  Weight (lbs) 295 lb 8 oz 295 lb 3.2 oz 294 lb 5 oz  Weight (kg) 134.038 kg 133.902 kg 133.5 kg      Telemetry    SR PVC;s 07/17/2020   ECG    No new tracing- Personally Reviewed  Physical Exam   Obese white male Lungs clear Distant heart sounds Abdomen benign Plus 2 firm LE edema from knees down   Labs    High Sensitivity Troponin:  No results for input(s): TROPONINIHS in the last 720  hours.    Chemistry Recent Labs  Lab 07/13/20 [phone] 07/14/20 0534 07/15/20 0311 07/16/20 0416 07/17/20 0433  NA 142   < > 141 141 140  K 3.5   < > 3.7 2.9* 2.6*  CL 99   < > 97* 92* 87*  CO2 30   < > 32 35* 37*  GLUCOSE 77   < > 155* 88 127*  BUN 29*   < > 39* 38* 42*  CREATININE 2.12*   < > 2.10* 1.84* 1.71*  CALCIUM 8.7*   < > 8.7* 9.1 9.1  PROT 6.8  --   --   --   --   ALBUMIN 3.4*  --   --   --   --   AST 22  --   --   --   --   ALT 24  --   --   --   --   ALKPHOS 90  --   --   --   --   BILITOT 0.8  --   --   --   --   GFRNONAA 33*   < > 34* 39*  43*  ANIONGAP 13   < > 12 14 16*   < > = values in this interval not displayed.     Hematology Recent Labs  Lab 07/13/20 0643 07/17/20 0433  WBC 5.6 5.1  RBC 3.86* 4.19*  HGB 12.2* 13.3  HCT 38.3* 40.5  MCV 99.2 96.7  MCH 31.6 31.7  MCHC 31.9 32.8  RDW 19.4* 18.3*  PLT 128* 124*    BNPNo results for input(s): BNP, PROBNP in the last 168 hours.   DDimer No results for input(s): DDIMER in the last 168 hours.   Radiology    N/A  Cardiac Studies   R/LHC (07/12/2020): 1. Moderate noncritical coronary artery disease involving multiple vessels, similar to the prior catheterization from 2017. 2. Severely elevated left heart, right heart, and pulmonary artery pressures. 3. Mildly reduced Fick cardiac output/index.  Limited TTE (07/08/2020): 1. Left ventricular ejection fraction, by estimation, is 20 to 25%. The  left ventricle has severely decreased function. The left ventricle  demonstrates global hypokinesis. The left ventricular internal cavity size  was moderately dilated. Left  ventricular diastolic parameters are indeterminate.  2. Right ventricular systolic function is severely reduced. The right  ventricular size is severely enlarged. There is normal pulmonary artery  systolic pressure.  3. Left atrial size was moderately dilated.  4. Mild to moderate mitral valve regurgitation.  Patient Profile      70 y.o. male history of nonobstructive CAD, HFrEF secondary to NICM, diabetes, PAF on Eliquis, OSA on CPAP, CKD stage III, and HTN presenting due to dyspnea on exertion and volume overload.  Assessment & Plan    1.  HFrEF EF 25%  - Chart indicates 3 Liters out yesterday  - I/O negative although weights not changing  -Continue IV Lasix 80 mg twice daily Change to PO in am  -Zaroxyln 2.5 mg daily  -still needs 1-2 days more inpatient diuresis   - follow Cr closely with single kideny now improved 1.7  - Outpatient advance CHF consult   2.  CAD nonobstructive: -LHC 07/2020 as above with noncritical disease -Eliquis in place of ASA given PAF in an effort to minimize bleeding risk -Coreg -Crestor  3. PAF: -Tele in NSR  -CHADS2VASc 5 (CHF, HTN, age x 1, DM, vascular disease) -Eliquis -Coreg -amiodarone d/c abnormal TFTS  -AST/ALT normal   4.  Diabetes -Continue PTA medications  5 CKD stage III: -SCr stable single kidney consider low dose ACE/ARB challenge once diuresed Cr 1.7 today      Signed, Jenkins Rouge, MD  07/17/2020, 12:09 PM

## 2020-07-17 NOTE — Progress Notes (Signed)
Informed attending, Nelva Bush MD and covering Sharion Settler, NP of critical lab K+ 2.6.

## 2020-07-18 LAB — BASIC METABOLIC PANEL
Anion gap: 17 — ABNORMAL HIGH (ref 5–15)
BUN: 47 mg/dL — ABNORMAL HIGH (ref 8–23)
CO2: 40 mmol/L — ABNORMAL HIGH (ref 22–32)
Calcium: 9.8 mg/dL (ref 8.9–10.3)
Chloride: 81 mmol/L — ABNORMAL LOW (ref 98–111)
Creatinine, Ser: 1.9 mg/dL — ABNORMAL HIGH (ref 0.61–1.24)
GFR, Estimated: 38 mL/min — ABNORMAL LOW (ref >60)
Glucose, Bld: 108 mg/dL — ABNORMAL HIGH (ref 70–99)
Potassium: 2.7 mmol/L — CL (ref 3.5–5.1)
Sodium: 138 mmol/L (ref 135–145)

## 2020-07-18 LAB — GLUCOSE, CAPILLARY
Glucose-Capillary: 99 mg/dL (ref 70–99)
Glucose-Capillary: 203 mg/dL — ABNORMAL HIGH (ref 70–99)
Glucose-Capillary: 148 mg/dL — ABNORMAL HIGH (ref 70–99)
Glucose-Capillary: 237 mg/dL — ABNORMAL HIGH (ref 70–99)

## 2020-07-18 MED ORDER — TORSEMIDE 20 MG PO TABS
40.0000 mg | ORAL_TABLET | Freq: Two times a day (BID) | ORAL | Status: DC
  Administered 2020-07-18 – 2020-07-19 (×2): 40 mg via ORAL
  Filled 2020-07-18 (×3): qty 2

## 2020-07-18 MED ORDER — AMIODARONE HCL 200 MG PO TABS
200.0000 mg | ORAL_TABLET | Freq: Every day | ORAL | Status: DC
  Administered 2020-07-18 – 2020-07-19 (×2): 200 mg via ORAL
  Filled 2020-07-18 (×2): qty 1

## 2020-07-18 MED ORDER — POTASSIUM CHLORIDE CRYS ER 20 MEQ PO TBCR
80.0000 meq | EXTENDED_RELEASE_TABLET | Freq: Once | ORAL | Status: AC
  Administered 2020-07-18: 80 meq via ORAL
  Filled 2020-07-18: qty 4

## 2020-07-18 MED ORDER — POTASSIUM CHLORIDE CRYS ER 20 MEQ PO TBCR
40.0000 meq | EXTENDED_RELEASE_TABLET | Freq: Once | ORAL | Status: AC
  Administered 2020-07-18: 40 meq via ORAL
  Filled 2020-07-18: qty 2

## 2020-07-18 NOTE — Clinical Social Work Note (Signed)
CSW acknowledges consult for a scale. Will follow for discharge planning and provide when going home if he cannot afford to buy one on his own.  Dayton Scrape, [place]

## 2020-07-18 NOTE — Progress Notes (Signed)
Progress Note  Patient Name: [name] Date of Encounter: 07/18/2020  [facility] Cardiologist: Nelva Bush, MD   Subjective   Overnight UOP -3.4L. Creatinine slightly up this morning. Breathing is much better. He did have a twinge of chest pain.   Inpatient Medications    Scheduled Meds: . apixaban  5 mg Oral BID  . carvedilol  6.25 mg Oral BID WC  . diphenhydrAMINE  25 mg Oral QHS  . docusate sodium  300 mg Oral BID  . empagliflozin  10 mg Oral QPM  . furosemide  80 mg Intravenous BID  . insulin aspart  0-15 Units Subcutaneous TID WC  . insulin glargine  28 Units Subcutaneous QHS  . liraglutide  1.8 mg Subcutaneous QPM  . magnesium oxide  400 mg Oral Daily  . metolazone  2.5 mg Oral Daily  . montelukast  10 mg Oral QHS  . potassium chloride  80 mEq Oral Once  . psyllium  1 packet Oral Daily  . rosuvastatin  20 mg Oral QPM  . sodium chloride flush  3 mL Intravenous Q12H   Continuous Infusions: . sodium chloride     PRN Meds: sodium chloride, acetaminophen, clonazePAM, clonazePAM, ondansetron (ZOFRAN) IV, polyvinyl alcohol, sodium chloride flush   Vital Signs    Vitals:   07/17/20 1943 07/18/20 0038 07/18/20 0414 07/18/20 0544  BP: 119/73 123/66 126/83   Pulse: 77 79 76   Resp: 20 18 20    Temp: 97.8 F (36.6 C) 98 F (36.7 C) 97.6 F (36.4 C)   TempSrc:  Oral    SpO2: 98% 96% 100%   Weight:    122.7 kg  Height:        Intake/Output Summary (Last 24 hours) at 07/18/2020 1042 Last data filed at 07/18/2020 0900 Gross per 24 hour  Intake 2540 ml  Output 2950 ml  Net -410 ml   Last 3 Weights 07/18/2020 07/15/2020 07/14/2020  Weight (lbs) 270 lb 9.6 oz 295 lb 8 oz 295 lb 3.2 oz  Weight (kg) 122.743 kg 134.038 kg 133.902 kg      Telemetry    NSr, PVCs, HR 70-80s - Personally Reviewed  ECG    No new - Personally Reviewed  Physical Exam   GEN: No acute distress.   Neck: No JVD Cardiac: RRR, no murmurs, rubs, or gallops.  Respiratory:  Clear to auscultation bilaterally. GI: Soft, nontender, non-distended  MS: Trace edema; No deformity. Neuro:  Nonfocal  Psych: Normal affect   Labs    High Sensitivity Troponin:  No results for input(s): TROPONINIHS in the last 720 hours.    Chemistry Recent Labs  Lab 07/13/20 [phone] 07/14/20 0534 07/16/20 0416 07/17/20 0433 07/18/20 0612  NA 142   < > 141 140 138  K 3.5   < > 2.9* 2.6* 2.7*  CL 99   < > 92* 87* 81*  CO2 30   < > 35* 37* 40*  GLUCOSE 77   < > 88 127* 108*  BUN 29*   < > 38* 42* 47*  CREATININE 2.12*   < > 1.84* 1.71* 1.90*  CALCIUM 8.7*   < > 9.1 9.1 9.8  PROT 6.8  --   --   --   --   ALBUMIN 3.4*  --   --   --   --   AST 22  --   --   --   --   ALT 24  --   --   --   --  ALKPHOS 90  --   --   --   --   BILITOT 0.8  --   --   --   --   GFRNONAA 33*   < > 39* 43* 38*  ANIONGAP 13   < > 14 16* 17*   < > = values in this interval not displayed.     Hematology Recent Labs  Lab 07/13/20 0643 07/17/20 0433  WBC 5.6 5.1  RBC 3.86* 4.19*  HGB 12.2* 13.3  HCT 38.3* 40.5  MCV 99.2 96.7  MCH 31.6 31.7  MCHC 31.9 32.8  RDW 19.4* 18.3*  PLT 128* 124*    BNPNo results for input(s): BNP, PROBNP in the last 168 hours.   DDimer No results for input(s): DDIMER in the last 168 hours.   Radiology    No results found.  Cardiac Studies   R/LHC (07/12/2020): 1. Moderate noncritical coronary artery disease involving multiple vessels, similar to the prior catheterization from 2017. 2. Severely elevated left heart, right heart, and pulmonary artery pressures. 3. Mildly reduced Fick cardiac output/index.  Limited TTE (07/08/2020): 1. Left ventricular ejection fraction, by estimation, is 20 to 25%. The  left ventricle has severely decreased function. The left ventricle  demonstrates global hypokinesis. The left ventricular internal cavity size  was moderately dilated. Left  ventricular diastolic parameters are indeterminate.  2. Right ventricular systolic  function is severely reduced. The right  ventricular size is severely enlarged. There is normal pulmonary artery  systolic pressure.  3. Left atrial size was moderately dilated.  4. Mild to moderate mitral valve regurgitation.  Patient Profile     70 y.o. male with pmh of nonobstructive CAD, HFrEF secondary to NICM, diabetes, PAD on Eliquis, OSA on CPAP, CKD stage 3, HTN who is being seen for acute heart failure.   Assessment & Plan    HFrEF EF 25% - IV lasix 80mg  BID and Zaroxyln 2.5mg  daily>>transition to oral - UOP -3.4L, net -9.7L - weight 290lbs>270lbs - creatinine mildly up - PTA torsemide 40mg  BID. Patient appears relatively euvolemic on exam. Will switch to home tormsemide  Nonobstructive CAD - LHC 07/2020 as above with noncritical disease - No Aspirin with Eliquis - continue coreg and crestor  PAF - Tele shows NSR - CHADSVASC at least 5 - continue Eliquis - coreg for rate control - amiodarone d/c due to abnormal LFTs, they were very mildly elevated - AST/ALT normal>will defer resumption of amio to MD  Diabetes - continue PTA meds  CKD stage 3 s/p left nephrectomy - Cr 1.71>1.90 - baseline ?1.6-1.8  Hypokalemia - 21mEq x2 today - BMET tomorrow  For questions or updates, please contact [facility] Please consult [email or web address] for contact info under        Signed, Chelby Salata Ninfa Meeker, PA-C  07/18/2020, 10:42 AM

## 2020-07-18 NOTE — Progress Notes (Signed)
   Heart Failure Nurse Navigator Note  HFrEF 20-25%.  The left ventricle demonstrates global hypokinesis.  The left ventricle internal cavity is mildly dilated.  Right ventricular systolic function is severely reduced.  Left atrium is severelty enlarged.  Mild to moderate mitral regurgitation.   Hea was admitted for right and left heart catheterization due to volume overload.  Comorbidities:  Paroxysmal atrial fiabrilliation Chronic kidney disease state III Type 2 diabetes Obstructive sleep apnea compliant with CPAP  Medications:    Potassium 160 meq today Furosemide and metolazone discontinued after given at [place]:  Sodium 138, potassium 2.7, chloride 81,Co2 40, BUN 47 up from 42, creatinie 1.90 up from 1.7, anion gap 17. Intake 2540 output 2950 Weight 122.7 on the 11th he was 134 kg Pressure 123/66   Assessment:  General-he is awake and alert lying supine in bed on room air in no distress.  HEENT-pupils are equal  Cardiac-heart tones are regular rate and rhythm murmurs rubs noted.   Chest-lungs are clear to auscultation anteriorly.  Abdomen-rounded soft nontender.  Musculoskeletal-trace edema, skin discoloration significant for venous insufficiency.  Psych-is pleasant and appropriate makes good eye contact.  Neurologic-speech is clear, moves all extremities without difficulty.   With patient today, discussed the importance of daily weights and recording.  Also stressed the importance of following up in heart failure clinic and to make sure that he takes his medication bottles with him.  He states that he feels that transportation will be up problem and questions if Otila Kluver would not see him virtually for a while.  Pricilla Riffle RN, CHFN

## 2020-07-18 NOTE — Care Management Important Message (Signed)
Important Message  Patient Details  Name: [name] MRN: [id number] Date of Birth: Jun 16, 1950   Medicare Important Message Given:  Yes     Dannette Barbara 07/18/2020, 11:23 AM

## 2020-07-18 NOTE — Plan of Care (Signed)
  Problem: Clinical Measurements: Goal: Ability to maintain clinical measurements within normal limits will improve Outcome: Progressing Goal: Will remain free from infection Outcome: Progressing Goal: Diagnostic test results will improve Outcome: Progressing Goal: Respiratory complications will improve Outcome: Progressing Goal: Cardiovascular complication will be avoided Outcome: Progressing   Problem: Safety: Goal: Ability to remain free from injury will improve Outcome: Progressing   Problem: Skin Integrity: Goal: Risk for impaired skin integrity will decrease Outcome: Progressing   Patient is involved in and agrees with the plan of care. V/S stable. Denies any pain or SOB. Had runs of vtach 8 beats; oncall provider informed.

## 2020-07-19 DIAGNOSIS — R945 Abnormal results of liver function studies: Secondary | ICD-10-CM

## 2020-07-19 DIAGNOSIS — R7989 Other specified abnormal findings of blood chemistry: Secondary | ICD-10-CM

## 2020-07-19 LAB — BASIC METABOLIC PANEL
Anion gap: 15 (ref 5–15)
BUN: 53 mg/dL — ABNORMAL HIGH (ref 8–23)
CO2: 41 mmol/L — ABNORMAL HIGH (ref 22–32)
Calcium: 9.4 mg/dL (ref 8.9–10.3)
Chloride: 84 mmol/L — ABNORMAL LOW (ref 98–111)
Creatinine, Ser: 1.85 mg/dL — ABNORMAL HIGH (ref 0.61–1.24)
GFR, Estimated: 39 mL/min — ABNORMAL LOW (ref >60)
Glucose, Bld: 137 mg/dL — ABNORMAL HIGH (ref 70–99)
Potassium: 3.5 mmol/L (ref 3.5–5.1)
Sodium: 140 mmol/L (ref 135–145)

## 2020-07-19 LAB — GLUCOSE, CAPILLARY: Glucose-Capillary: 138 mg/dL — ABNORMAL HIGH (ref 70–99)

## 2020-07-19 MED ORDER — LOSARTAN POTASSIUM 25 MG PO TABS: 12.5000 mg | ORAL_TABLET | Freq: Every day | ORAL | Status: DC

## 2020-07-19 MED ORDER — MAGNESIUM OXIDE 400 (241.3 MG) MG PO TABS: 400.0000 mg | ORAL_TABLET | Freq: Every day | ORAL | 6 refills | Status: DC

## 2020-07-19 MED ORDER — POTASSIUM CHLORIDE CRYS ER 20 MEQ PO TBCR: 40.0000 meq | EXTENDED_RELEASE_TABLET | Freq: Every day | ORAL | 6 refills | Status: DC

## 2020-07-19 MED ORDER — LOSARTAN POTASSIUM 25 MG PO TABS: 12.5000 mg | ORAL_TABLET | Freq: Every day | ORAL | 6 refills | Status: DC

## 2020-07-19 MED ORDER — POTASSIUM CHLORIDE CRYS ER 20 MEQ PO TBCR: 40.0000 meq | EXTENDED_RELEASE_TABLET | Freq: Every day | ORAL | Status: DC

## 2020-07-19 MED ORDER — CARVEDILOL 6.25 MG PO TABS: 6.2500 mg | ORAL_TABLET | Freq: Two times a day (BID) | ORAL | 6 refills | Status: DC

## 2020-07-19 NOTE — Discharge Summary (Addendum)
Discharge Summary    Patient ID: [name] MRN: [id number]; DOB: 1950/08/08  Admit date: 07/12/2020 Discharge date: 07/19/2020  PCP:  Vidal Schwalbe, MD   [place]  Cardiologist:  Nelva Bush, MD  Advanced Practice Provider:  No care team member to display Electrophysiologist:  None  :[id number]}    Discharge Diagnoses    Principal Problem:   Acute on chronic HFrEF (heart failure with reduced ejection fraction) ([place]) Active Problems:   Morbid obesity due to excess calories (HCC)   Paroxysmal atrial fibrillation ([place])   Uncontrolled type 2 diabetes mellitus ([place])   Coronary artery disease involving native coronary artery of native heart without angina pectoris   Essential hypertension   Hyperlipidemia LDL goal <70   NICM (nonischemic cardiomyopathy) ([place])   Chronic kidney disease, stage III (moderate) (HCC)   Abnormal LFTs   Diagnostic Studies/Procedures    Right and Left Cardiac catheterization 07/12/20 Conclusions: 1. Moderate noncritical coronary artery disease involving multiple vessels, similar to the prior catheterization from 2017. 2. Severely elevated left heart, right heart, and pulmonary artery pressures. 3. Mildly reduced Fick cardiac output/index.  Recommendations: 1. Admit for diuresis and optimization of goal-directed medical therapy. 2. Continue secondary prevention of coronary artery disease.  Nelva Bush, MD [facility]  Echo 07/08/20 1. Left ventricular ejection fraction, by estimation, is 20 to 25%. The  left ventricle has severely decreased function. The left ventricle  demonstrates global hypokinesis. The left ventricular internal cavity size  was moderately dilated. Left  ventricular diastolic parameters are indeterminate.  2. Right ventricular systolic function is severely reduced. The right  ventricular size is severely enlarged. There is normal pulmonary artery  systolic pressure.  3. Left  atrial size was moderately dilated.  4. Mild to moderate mitral valve regurgitation.  _____________   History of Present Illness     [name] is a 70 y.o. male with pmh of chronic HFrEF, CAD s/p remote PCI to the proximal LAD, PAF, CKD stage 3, HTN, DM2 who was admitted for acute heart failure. The patient was seen in the office 07/08/2020 at which time he reported significant progression of chronic exertional dyspnea over the last 2 to 3 months.  HE was out of breath walking only a few feet. He also endorsed orthopnea. Denied any chest pain. Hr reported worsening lower leg edema prompting him to increase torsemide to 40 mg twice daily at direction of his nephrologist.  Echo showed severe biventricular dysfunction, prompting referral for left and right heart cath.  Patient presented for his heart catheterization to 07/12/2020.  He was significantly volume overloaded on exam and by his right heart catheterization. Heart catheterization also showed moderate, nonobstructive CAD similar to prior study in 2017.  Due to CKD and worsening symptoms despite aggressive oral diuretic therapy patient was admitted for inpatient diuresis and optimization of goal-directed medical therapy.  Hospital Course     Consultants: None  The patient was admitted and started on IV Lasix.  Coreg was decreased to 6.25 mg twice daily. Aspirin was deferred in the setting of long-term anticoagulation with Eliquis. Due to abnormal LFTs amiodarone was discontinued. Other cardiac home medications were continued.  TSH elevated and FT4 normal, however this can be checked in the outpatient setting. Patient maintained sinus rhythm throughout the admission. Kidney function stayed relatively stable. Patient diuresed a total of 11 L.  Weight decreased from 294 pounds to 265 pounds at discharge.  Patient was transitioned to torsemide 40 mg  twice daily. LFTs normalized and amiodarone was restarted. Due to stable creatinine patient was  started on losartan 12.5 mg daily.  He will need BMET at follow-up. At discharge plan to send patient out with amiodarone 200 mg daily, Eliquis 5 mg twice daily, Coreg 6.25 mg twice daily, Jardiance, losartan 12.5 mg daily, torsemide 40 mg twice daily.   Patient was evaluated by Dr. And on 07/19/2020 and felt to be stable for discharge.   GEN:No acute distress.   Neck:No JVD Cardiac:RRR, no murmurs, rubs, or gallops.  Respiratory:Clear to auscultation bilaterally. DG:UYQI, nontender, non-distended  HK:VQQVZ edema; No deformity. Neuro:Nonfocal  Psych: Normal affect    Did the patient have an acute coronary syndrome (MI, NSTEMI, STEMI, etc) this admission?:  No                               Did the patient have a percutaneous coronary intervention (stent / angioplasty)?:  No.    _____________  Discharge Vitals Blood pressure 122/72, pulse 80, temperature (!) 97.5 F (36.4 C), temperature source Oral, resp. rate 19, height 6' (1.829 m), weight 120.6 kg, SpO2 99 %.  Filed Weights   07/15/20 0500 07/18/20 0544 07/19/20 0500  Weight: 134 kg 122.7 kg 120.6 kg    Labs & Radiologic Studies    CBC Recent Labs    07/17/20 0433  WBC 5.1  HGB 13.3  HCT 40.5  MCV 96.7  PLT 563*   Basic Metabolic Panel Recent Labs    07/18/20 0612 07/19/20 0713  NA 138 140  K 2.7* 3.5  CL 81* 84*  CO2 40* 41*  GLUCOSE 108* 137*  BUN 47* 53*  CREATININE 1.90* 1.85*  CALCIUM 9.8 9.4   Liver Function Tests No results for input(s): AST, ALT, ALKPHOS, BILITOT, PROT, ALBUMIN in the last 72 hours. No results for input(s): LIPASE, AMYLASE in the last 72 hours. High Sensitivity Troponin:   No results for input(s): TROPONINIHS in the last 720 hours.  BNP Invalid input(s): POCBNP D-Dimer No results for input(s): DDIMER in the last 72 hours. Hemoglobin A1C No results for input(s): HGBA1C in the last 72 hours. Fasting Lipid Panel No results for input(s): CHOL, HDL, LDLCALC, TRIG, CHOLHDL,  LDLDIRECT in the last 72 hours. Thyroid Function Tests No results for input(s): TSH, T4TOTAL, T3FREE, THYROIDAB in the last 72 hours.  Invalid input(s): FREET3 _____________  CARDIAC CATHETERIZATION  Result Date: 07/12/2020 Conclusions: 1. Moderate noncritical coronary artery disease involving multiple vessels, similar to the prior catheterization from 2017. 2. Severely elevated left heart, right heart, and pulmonary artery pressures. 3. Mildly reduced Fick cardiac output/index. Recommendations: 1. Admit for diuresis and optimization of goal-directed medical therapy. 2. Continue secondary prevention of coronary artery disease. Nelva Bush, MD [facility]   [place] ASCITES (ABDOMEN LIMITED)  Result Date: 07/14/2020 CLINICAL DATA:  Abdominal distension, assess for ascites EXAM: LIMITED ABDOMEN ULTRASOUND FOR ASCITES TECHNIQUE: Limited ultrasound survey for ascites was performed in all four abdominal quadrants. COMPARISON:  11/29/2019 FINDINGS: Survey of the abdominal 4 quadrants demonstrates a small to moderate amount of ascites throughout. IMPRESSION: Small to moderate abdominopelvic ascites Electronically Signed   By: Jerilynn Mages.  Shick M.D.   On: 07/14/2020 11:17   ECHOCARDIOGRAM LIMITED  Result Date: 07/08/2020    ECHOCARDIOGRAM LIMITED REPORT   Patient Name:   [name] Date of Exam: 07/08/2020 Medical Rec #:  [id number]         Height:  72.0 in Accession #:    [id number]        Weight:       271.0 lb Date of Birth:  07-01-1950         BSA:          2.424 m Patient Age:    37 years          BP:           132/80 mmHg Patient Gender: M                 HR:           76 bpm. Exam Location:  [facility] Procedure: Limited Echo, Limited Color Doppler and Intracardiac Opacification            Agent Indications:    I50.20* Unspecified systolic (congestive) heart failure; I42.80                 Non-ischemic cardiomyopathy  History:        Patient has prior history of Echocardiogram examinations, most                  recent 12/03/2019. CHF and Cardiomyopathy, CAD, Pulmonary HTN,                 Mitral Valve Disease, Arrythmias:Atrial Fibrillation,                 Signs/Symptoms:Shortness of Breath; Risk Factors:Sleep Apnea,                 Hypertension, Diabetes and Dyslipidemia.  Sonographer:    Pilar Jarvis RDMS, RVT, RDCS Referring Phys: [place]  1. Left ventricular ejection fraction, by estimation, is 20 to 25%. The left ventricle has severely decreased function. The left ventricle demonstrates global hypokinesis. The left ventricular internal cavity size was moderately dilated. Left ventricular diastolic parameters are indeterminate.  2. Right ventricular systolic function is severely reduced. The right ventricular size is severely enlarged. There is normal pulmonary artery systolic pressure.  3. Left atrial size was moderately dilated.  4. Mild to moderate mitral valve regurgitation. FINDINGS  Left Ventricle: Left ventricular ejection fraction, by estimation, is 20 to 25%. The left ventricle has severely decreased function. The left ventricle demonstrates global hypokinesis. Definity contrast agent was given IV to delineate the left ventricular endocardial borders. The left ventricular internal cavity size was moderately dilated. There is no left ventricular hypertrophy. Left ventricular diastolic parameters are indeterminate. Right Ventricle: The right ventricular size is severely enlarged. No increase in right ventricular wall thickness. Right ventricular systolic function is severely reduced. There is normal pulmonary artery systolic pressure. The tricuspid regurgitant velocity is 2.32 m/s, and with an assumed right atrial pressure of 5 mmHg, the estimated right ventricular systolic pressure is 95.6 mmHg. Left Atrium: Left atrial size was moderately dilated. Right Atrium: Right atrial size was normal in size. Pericardium: There is no evidence of pericardial effusion. Mitral Valve: The  mitral valve is normal in structure. There is mild thickening of the mitral valve leaflet(s). Mild mitral annular calcification. Mild to moderate mitral valve regurgitation. No evidence of mitral valve stenosis. Tricuspid Valve: The tricuspid valve is normal in structure. Tricuspid valve regurgitation is not demonstrated. No evidence of tricuspid stenosis. Aortic Valve: The aortic valve is normal in structure. Aortic valve regurgitation is not visualized. No aortic stenosis is present. Pulmonic Valve: The pulmonic valve was normal in structure. Pulmonic valve regurgitation is not visualized. No evidence of pulmonic  stenosis. Aorta: The aortic root is normal in size and structure. Venous: The inferior vena cava is normal in size with greater than 50% respiratory variability, suggesting right atrial pressure of 3 mmHg. IAS/Shunts: No atrial level shunt detected by color flow Doppler. LEFT VENTRICLE PLAX 2D LVIDd:         5.85 cm LVIDs:         5.45 cm LV PW:         1.05 cm LV IVS:        1.00 cm  RIGHT VENTRICLE RV Basal diam:  6.35 cm RV S prime:     5.33 cm/s TAPSE (M-mode): 1.2 cm LEFT ATRIUM         Index LA diam:    5.50 cm 2.27 cm/m  TRICUSPID VALVE TR Peak grad:   21.5 mmHg TR Vmax:        232.00 cm/s Ida Rogue MD Electronically signed by Ida Rogue MD Signature Date/Time: 07/08/2020/6:43:56 PM    Final    Disposition   Pt is being discharged home today in good condition.  Follow-up Plans & Appointments     Follow-up Information    Theora Gianotti, NP Follow up on 07/22/2020.   Specialties: Nurse Practitioner, Cardiology, Radiology Why: @ 3:10PM Contact information: [place]  [id number] [phone]              Discharge Instructions    (HEART FAILURE PATIENTS) Call MD:  Anytime you have any of the following symptoms: 1) 3 pound weight gain in 24 hours or 5 pounds in 1 week 2) shortness of breath, with or without a dry hacking cough 3)  swelling in the hands, feet or stomach 4) if you have to sleep on extra pillows at night in order to breathe.   Complete by: As directed    Diet - low sodium heart healthy   Complete by: As directed    Heart Failure patients record your daily weight using the same scale at the same time of day   Complete by: As directed    Increase activity slowly   Complete by: As directed    STOP any activity that causes chest pain, shortness of breath, dizziness, sweating, or exessive weakness   Complete by: As directed       Discharge Medications   Allergies as of 07/19/2020      Reactions   Other Hives   Blue cheese Blue cheese Blue cheese      Medication List    STOP taking these medications   carboxymethylcellulose 0.5 % Soln Commonly known as: REFRESH PLUS   diphenhydrAMINE 25 mg capsule Commonly known as: BENADRYL   Magnesium 300 MG Caps   montelukast 10 MG tablet Commonly known as: SINGULAIR   Mucinex Fast-Max Cold Flu 5-10-200-325 MG/10ML Liqd Generic drug: Phenylephrine-DM-GG-APAP   Mucinex Fast-Max Cold Flu Nght 12.5-5-325 MG/10ML Liqd Generic drug: diphenhydrAMINE-PE-APAP   Potassium 99 MG Tabs   Vitamin D (Ergocalciferol) 1.25 MG (50000 UNIT) Caps capsule Commonly known as: DRISDOL   Zinc 50 MG Tabs     TAKE these medications   amiodarone 200 MG tablet Commonly known as: PACERONE Take 1 tablet (200 mg total) by mouth daily.   apixaban 5 MG Tabs tablet Commonly known as: Eliquis Take 1 tablet (5 mg total) 2 (two) times daily by mouth.   BROMELAIN PO Take 2 capsules by mouth daily.   carvedilol 6.25 MG tablet Commonly known as: COREG Take  1 tablet (6.25 mg total) by mouth 2 (two) times daily with a meal. What changed:   medication strength  how much to take   CINNAMON PO Take 1,200 mg by mouth in the morning and at bedtime. [place] Cinnamon 1200mg    clonazePAM 1 MG tablet Commonly known as: KLONOPIN Take 1 mg by mouth See admin instructions. Take  1 tablet (1 mg) by mouth scheduled at bedtime, may take an additional dose during the day if needed for anxiety   CoQ10 400 MG Caps Take 400 mg by mouth 2 (two) times daily.   docusate sodium 100 MG capsule Commonly known as: COLACE Take 1 capsule (100 mg total) by mouth 2 (two) times daily. What changed: how much to take   glipiZIDE 10 MG tablet Commonly known as: GLUCOTROL Take 10 mg by mouth 2 (two) times daily.   HAWTHORNE BERRY PO Take 565 mg by mouth at bedtime.   insulin glargine (2 Unit Dial) 300 UNIT/ML Solostar Pen Commonly known as: TOUJEO MAX Inject 28 Units into the skin daily. Hold for blood sugar <70. Titrate up to 60 units/day, per endocrinology instructions.   Jardiance 10 MG Tabs tablet Generic drug: empagliflozin Take 10 mg by mouth every evening.   liraglutide 18 MG/3ML Sopn Commonly known as: VICTOZA Inject 1.8 mg into the skin every evening.   losartan 25 MG tablet Commonly known as: COZAAR Take 0.5 tablets (12.5 mg total) by mouth daily.   magnesium oxide 400 (241.3 Mg) MG tablet Commonly known as: MAG-OX Take 1 tablet (400 mg total) by mouth daily. Start taking on: July 20, 2020   MELATONIN-THEANINE PO Take 2 capsules by mouth at bedtime. [place] Sleep Gummies   potassium chloride SA 20 MEQ tablet Commonly known as: KLOR-CON Take 2 tablets (40 mEq total) by mouth daily.   PRESERVISION AREDS 2 PO Take 1 tablet by mouth in the morning and at bedtime.   psyllium 0.52 g capsule Commonly known as: REGULOID Take 2.6 g by mouth daily.   rosuvastatin 20 MG tablet Commonly known as: CRESTOR Take 20 mg by mouth every evening.   torsemide 20 MG tablet Commonly known as: DEMADEX Take 40 mg by mouth 2 (two) times daily.          Outstanding Labs/Studies   BMET at follow-up TSH/FT4 in 6-8 weeks  Duration of Discharge Encounter   Greater than 30 minutes including physician time.  Signed, Cadence Ninfa Meeker, PA-C 07/19/2020, 11:06  AM  I have independently seen and examined the patient and agree with the findings and plan, as documented in the PA/NP's note, with the following additions/changes.  Mr. Nieto feels well and close to baseline.  He has been able to ambulate without significant dyspnea.  He notes a couple of transient episodes of chest pain that seem to correspond to when he is having PVCs.  His leg edema is back to baseline.  Exam is notable for regular rate and rhythm without murmurs.  Lungs are clear.  There is trace pretibial edema.  JVP difficult to assess due to body habitus and facial hair.  Labs today are notable for potassium 3.5, BUN 53, and creatinine 1.9.  I think Mr. Margretta Sidle is stable for discharge home today and close outpatient follow-up.  We will plan to discharge him on his current regimen of carvedilol 6.25 mg twice daily, empagliflozin 10 mg daily, and torsemide 40 mg twice daily.  As his renal function is relatively stable, we will also challenge him  with losartan 12.5 mg daily.  He should be on standing potassium chloride 40 mEq daily given his persistent hypokalemia in the setting of aggressive diuresis this admission.  We will plan to continue current doses of apixaban and amiodarone, given high risk for recurrent atrial fibrillation in the setting of his severe cardiomyopathy.  Mr. Gallina is scheduled for follow-up in our office on Friday, at which time basic metabolic panel should be drawn to ensure stable renal function and electrolytes.  Greater than 30 minutes were spent on this discharge encounter.  Nelva Bush, MD [facility]

## 2020-07-19 NOTE — Plan of Care (Signed)
Patient discharged to home transported by neighbor to home.  Patient states neighbor has an appointment and is unable to wait for scale to arrive.  PIV removed with tip intact and patient dressed independently.Discharge instructions and medication changes reviewed with patient. Escorted via w/c to main entrance.

## 2020-07-19 NOTE — Clinical Social Work Note (Signed)
CSW told RN scale would be provided after rounds meeting which starts at 11:00 but patient left before this could be done.  CSW signing off.  Dayton Scrape, [place]

## 2020-07-21 ENCOUNTER — Telehealth: Payer: Self-pay

## 2020-07-21 NOTE — Telephone Encounter (Signed)
48-hour post hospital discharge phone call   Call to patient, he states that he feels that he is doing very well he is not having any shortness of breath he does not see any edema.  He apologized for not being able to stay in the hospital to get this scale as his ride was on a tight schedule.  He states that he is seen Ignacia Bayley tomorrow at 3:10 PM and wonders if he could be seen in the heart failure clinic with Darylene Price NP, instructed that I knew that her schedule was very full but that I would check with her first thing in the morning would get back to him.  Instructed at that time we would also see about getting him a scale.   Pricilla Riffle RN,CHFN

## 2020-07-22 ENCOUNTER — Other Ambulatory Visit
Admission: RE | Admit: 2020-07-22 | Discharge: 2020-07-22 | Disposition: A | Discharge status: Home / Self Care | Payer: Medicare Other | Source: Ambulatory Visit | Attending: Nurse Practitioner | Admitting: Nurse Practitioner

## 2020-07-22 ENCOUNTER — Other Ambulatory Visit: Payer: Self-pay | Admitting: Nurse Practitioner

## 2020-07-22 ENCOUNTER — Other Ambulatory Visit: Payer: Self-pay

## 2020-07-22 ENCOUNTER — Encounter: Payer: Self-pay | Admitting: Nurse Practitioner

## 2020-07-22 ENCOUNTER — Ambulatory Visit (INDEPENDENT_AMBULATORY_CARE_PROVIDER_SITE_OTHER): Payer: Medicare Other | Admitting: Nurse Practitioner

## 2020-07-22 VITALS — BP 112/68 | HR 82 | Ht 72.0 in | Wt 262.0 lb

## 2020-07-22 DIAGNOSIS — I5023 Acute on chronic systolic (congestive) heart failure: Secondary | ICD-10-CM | POA: Diagnosis not present

## 2020-07-22 DIAGNOSIS — I480 Paroxysmal atrial fibrillation: Secondary | ICD-10-CM | POA: Insufficient documentation

## 2020-07-22 DIAGNOSIS — I2510 Atherosclerotic heart disease of native coronary artery without angina pectoris: Secondary | ICD-10-CM | POA: Diagnosis not present

## 2020-07-22 DIAGNOSIS — Z79899 Other long term (current) drug therapy: Secondary | ICD-10-CM | POA: Insufficient documentation

## 2020-07-22 DIAGNOSIS — N183 Chronic kidney disease, stage 3 unspecified: Secondary | ICD-10-CM

## 2020-07-22 DIAGNOSIS — I10 Essential (primary) hypertension: Secondary | ICD-10-CM

## 2020-07-22 DIAGNOSIS — E785 Hyperlipidemia, unspecified: Secondary | ICD-10-CM

## 2020-07-22 LAB — COMPREHENSIVE METABOLIC PANEL
ALT: 34 U/L (ref 0–44)
AST: 36 U/L (ref 15–41)
Albumin: 3.9 g/dL (ref 3.5–5.0)
Alkaline Phosphatase: 115 U/L (ref 38–126)
Anion gap: 16 — ABNORMAL HIGH (ref 5–15)
BUN: 59 mg/dL — ABNORMAL HIGH (ref 8–23)
CO2: 36 mmol/L — ABNORMAL HIGH (ref 22–32)
Calcium: 9.3 mg/dL (ref 8.9–10.3)
Chloride: 85 mmol/L — ABNORMAL LOW (ref 98–111)
Creatinine, Ser: 2.28 mg/dL — ABNORMAL HIGH (ref 0.61–1.24)
GFR, Estimated: 30 mL/min — ABNORMAL LOW (ref >60)
Glucose, Bld: 245 mg/dL — ABNORMAL HIGH (ref 70–99)
Potassium: 3.4 mmol/L — ABNORMAL LOW (ref 3.5–5.1)
Sodium: 137 mmol/L (ref 135–145)
Total Bilirubin: 1 mg/dL (ref 0.3–1.2)
Total Protein: 7.8 g/dL (ref 6.5–8.1)

## 2020-07-22 LAB — TSH: TSH: 10.716 u[IU]/mL — ABNORMAL HIGH (ref 0.350–4.500)

## 2020-07-22 NOTE — Progress Notes (Signed)
Office Visit    Patient Name: [name] Date of Encounter: 07/22/2020  Primary Care Provider:  Vidal Schwalbe, MD Primary Cardiologist:  Jim Bush, MD  Chief Complaint    [name] is a 70 y/o male with pmh of chronic HFrEF, CAD s/p remote PCI to the proximal LAD, PAF, CKD stage 3, left nephrectomy, HTN, DM2, sleep apnea, pulmonary hypertension here for follow-up from his recent hospitalization for acute on chronic HFrEF admitted for IV diuresis and optimal GDMT and LHC which showed stable CAD, and severely elevated right and left heart pressures. He reports he is doing well since discharge w/o concerns.   Past Medical History    Past Medical History:  Diagnosis Date  . CKD (chronic kidney disease) stage 3, GFR 30-59 ml/min (HCC)   . Coronary artery disease    a. 1998 s/p ACS Multi-link BMS to the prox LAD ([place], [place]); b. 07/2015 Cath: LM nl, LAD patent stent, LCX min irregs, OM1/2 min irregs, OM3 nl, RCA min irregs; c. 07/2020 Cath: LM nl, LAD 50p/m, 70d, D2 70, LCX 40p/85m, OM3 40, RCA 60d->Med Rx.  . Diabetes mellitus type 2, uncontrolled ([place])    a. A1c 11.0 in 07/2015.  [name] HFrEF    a. EF 25% by cath in 2013; b. Echo in 07/2015 showing EF of 15-20%, moderate MR, moderate Pulm HTN, severely dilated IVC; c. 11/2016 Echo: EF 55-60%, no rwma, Gr1 DD, mildly dil LA, nl RV fxn. d. 12/2019 Echo: EF 40-45%, e. 07/2020 Echo: EF 20-25%, sev red RV fxn, mod dil LA, mild-mod MR.  [name] History of kidney stones   . Hypertension   . Ischemic cardiomyopathy   . Kidney stones    Left  . Paroxysmal atrial fibrillation ([place])    a. Dx 07/2015; b. CHA2DS2VASc = 5-->eliquis. Rhythm control w/ amiodarone.  . Pollen allergies 09-21-15   pt called and stated that he woke up and had some drainage-pt states he did not see the color of the drainage-pt denies running a fever and this only happened once-pt instructed to call Dr Audree Bane office if he starts running a fever or if the color of  drainage becomes yellow/green  . Psoriasis   . Pulmonary hypertension ([place])   . Renal disorder    kidney stone  . Sleep apnea    Past Surgical History:  Procedure Laterality Date  . CARDIAC CATHETERIZATION N/A 07/25/2015   Procedure: Right/Left Heart Cath and Coronary Angiography;  Surgeon: Wellington Hampshire, MD;  Location: [facility];  Service: Cardiovascular;  Laterality: N/A;  . CARDIAC CATHE[place]     [place]  . CARDIAC CATHETERIZATION     [facility], [place]    . CYSTOSCOPY WITH STENT PLACEMENT Left 09/07/2015   Procedure: CYSTOSCOPY WITH STENT PLACEMENT;  Surgeon: Hollice Espy, MD;  Location: [facility];  Service: Urology;  Laterality: Left;  . CYSTOSCOPY/URETEROSCOPY/HOLMIUM LASER/STENT PLACEMENT Left 10/25/2015   Procedure: CYSTOSCOPY/URETEROSCOPY/HOLMIUM LASER/STENT EXCHANGE;  Surgeon: Hollice Espy, MD;  Location: [facility];  Service: Urology;  Laterality: Left;  . CYSTOSCOPY/URETEROSCOPY/HOLMIUM LASER/STENT PLACEMENT Left 05/11/2019   Procedure: CYSTOSCOPY/URETEROSCOPY/HOLMIUM LASER/STENT PLACEMENT;  Surgeon: Hollice Espy, MD;  Location: [facility];  Service: Urology;  Laterality: Left;  . EXTRACORPOREAL SHOCK WAVE LITHOTRIPSY Left 04/02/2019   Procedure: EXTRACORPOREAL SHOCK WAVE LITHOTRIPSY (ESWL);  Surgeon: Hollice Espy, MD;  Location: [facility];  Service: Urology;  Laterality: Left;  . EXTRACORPOREAL SHOCK WAVE LITHOTRIPSY Left 03/05/2019   Procedure:  EXTRACORPOREAL SHOCK WAVE LITHOTRIPSY (ESWL);  Surgeon: Abbie Sons, MD;  Location: [facility];  Service: Urology;  Laterality: Left;  . EYE SURGERY Bilateral    LASER FOR GLAUCOMA  . KIDNEY SURGERY    . NEPHROSTOMY TUBE PLACEMENT ([place] [facility]) Left 11/2019  . RIGHT/LEFT HEART CATH AND CORONARY ANGIOGRAPHY N/A 07/12/2020   Procedure: RIGHT/LEFT HEART CATH AND CORONARY ANGIOGRAPHY;  Surgeon: Jim Bush, MD;  Location: [place] [facility];  Service:  Cardiovascular;  Laterality: N/A;  . ROBOT ASSISTED LAPAROSCOPIC NEPHRECTOMY Left 12/10/2019   Procedure: XI ROBOTIC ASSISTED LAPAROSCOPIC NEPHRECTOMY;  Surgeon: Hollice Espy, MD;  Location: [facility];  Service: Urology;  Laterality: Left;  . URETEROSCOPY WITH HOLMIUM LASER LITHOTRIPSY Left 09/07/2015   Procedure: URETEROSCOPY WITH HOLMIUM LASER LITHOTRIPSY;  Surgeon: Hollice Espy, MD;  Location: [facility];  Service: Urology;  Laterality: Left;   Allergies  Allergies  Allergen Reactions  . Other Hives and Other (See Comments)    Blue cheese Blue cheese Blue cheese    History of Present Illness    Pleasant 70 y.o. male with pmh of chronic HFrEF, CAD s/p remote PCI to the proximal LAD, PAF, CKD stage 3, HTN, DM2 who was admitted for acute heart failure. The patient was seen in the office 07/08/2020 at which time he reported significant progression of chronic exertional dyspnea over the last 2 to 3 months.  He was out of breath walking only a few feet. He also endorsed orthopnea. Denied any chest pain. He reported worsening lower leg edema prompting him to increase torsemide to 40 mg twice daily at direction of his nephrologist.  Echo showed severe biventricular dysfunction, prompting referral for left and right heart cath.  He presented for his heart catheterization on 07/12/2020.  He was significantly volume overloaded on exam and by his right heart catheterization. Heart catheterization also showed moderate, nonobstructive CAD similar to prior study in 2017.  Due to CKD and worsening symptoms despite aggressive oral diuretic therapy patient was admitted for inpatient diuresis and optimization of goal-directed medical therapy.  He was admitted and started on IV Lasix.  Coreg was decreased to 6.25 mg twice daily. Aspirin was deferred in the setting of long-term anticoagulation with Eliquis. Due to abnormal LFTs amiodarone was discontinued. Other cardiac home medications were continued.  TSH elevated  and FT4 normal, however this can be checked in the outpatient setting. Patient maintained sinus rhythm throughout the admission. Kidney function stayed relatively stable. Patient diuresed a total of 11 L.  Weight decreased from 294 pounds to 265 pounds at discharge.  Patient was transitioned to torsemide 40 mg twice daily. LFTs normalized and amiodarone was restarted. Due to stable creatinine patient was started on losartan 12.5 mg daily.    Today, he reports he has been feeling well since leaving the hospital. He has not had a scale at home so he has not weighed since d/c but weight today is 262 lb, down from 265 at discharge. He feels that his breathing is improved and reports he feels much less distended in his abdomen. He reports that his legs are also much less swollen and he is better able to ambulate since d/c. He denies chest discomfort, palpitations, syncope, orthopnea, or PND.   Home Medications    Prior to Admission medications   Medication Sig Start Date End Date Taking? Authorizing Provider  amiodarone (PACERONE) 200 MG tablet Take 1 tablet (200 mg total) by mouth daily. 08/01/15   Hower, Aaron Mose, MD  apixaban (  ELIQUIS) 5 MG TABS tablet Take 1 tablet (5 mg total) 2 (two) times daily by mouth. 04/17/17   End, Harrell Gave, MD  Bromelains (BROMELAIN PO) Take 2 capsules by mouth daily.    [provider]  carvedilol (COREG) 6.25 MG tablet Take 1 tablet (6.25 mg total) by mouth 2 (two) times daily with a meal. 07/19/20   Furth, Cadence H, PA-C  CINNAMON PO Take 1,200 mg by mouth in the morning and at bedtime. [place] Cinnamon 1200mg     [provider]  clonazePAM (KLONOPIN) 1 MG tablet Take 1 mg by mouth See admin instructions. Take 1 tablet (1 mg) by mouth scheduled at bedtime, may take an additional dose during the day if needed for anxiety 01/23/17   [provider]  Coenzyme Q10 (COQ10) 400 MG CAPS Take 400 mg by mouth 2 (two) times daily.    [provider]   docusate sodium (COLACE) 100 MG capsule Take 1 capsule (100 mg total) by mouth 2 (two) times daily. Patient taking differently: Take 300 mg by mouth 2 (two) times daily. 03/05/19   Hollice Espy, MD  glipiZIDE (GLUCOTROL) 10 MG tablet Take 10 mg by mouth 2 (two) times daily.     [provider]  HAWTHORNE BERRY PO Take 565 mg by mouth at bedtime.    [provider]  insulin glargine, 2 Unit Dial, (TOUJEO MAX) 300 UNIT/ML Solostar Pen Inject 28 Units into the skin daily. Hold for blood sugar <70. Titrate up to 60 units/day, per endocrinology instructions. 05/05/18   [provider]  JARDIANCE 10 MG TABS tablet Take 10 mg by mouth every evening.  04/28/18   [provider]  liraglutide (VICTOZA) 18 MG/3ML SOPN Inject 1.8 mg into the skin every evening. 05/24/20   [provider]  losartan (COZAAR) 25 MG tablet Take 0.5 tablets (12.5 mg total) by mouth daily. 07/19/20   Furth, Cadence H, PA-C  magnesium oxide (MAG-OX) 400 (241.3 Mg) MG tablet Take 1 tablet (400 mg total) by mouth daily. 07/20/20   Furth, Cadence H, PA-C  MELATONIN-THEANINE PO Take 2 capsules by mouth at bedtime. Olly Sleep Gummies    [provider]  Multiple Vitamins-Minerals (PRESERVISION AREDS 2 PO) Take 1 tablet by mouth in the morning and at bedtime.    [provider]  potassium chloride SA (KLOR-CON) 20 MEQ tablet Take 2 tablets (40 mEq total) by mouth daily. 07/19/20   Furth, Cadence H, PA-C  psyllium (REGULOID) 0.52 g capsule Take 2.6 g by mouth daily.    [provider]  rosuvastatin (CRESTOR) 20 MG tablet Take 20 mg by mouth every evening.  01/27/19   [provider]  torsemide (DEMADEX) 20 MG tablet Take 40 mg by mouth 2 (two) times daily.    [provider]    Review of Systems    He denies chest discomfort, palpitations, PND, orthopnea, syncope, early satiety, edema. Reports skin on lower extremities less erythematous than before  hospitalization. All other systems reviewed and are otherwise negative except as noted above.  Physical Exam    VS:  Today's Vitals   07/22/20 1525  BP: 112/68  Pulse: 82  SpO2: 96%  Weight: 262 lb (118.8 kg)  Height: 6' (1.829 m)   Body mass index is 35.53 kg/m. GEN: Obese, well developed, in no acute distress. HEENT: normal. Neck: Supple, no JVD, carotid bruits, or masses. Cardiac: RRR, no murmurs, rubs, or gallops. No clubbing, cyanosis. Trace edema bilateral lower extremities  w/erythema. Radials/PT 2+ and equal bilaterally.  Respiratory:  Respirations regular and unlabored, clear to auscultation bilaterally. GI: Soft, nontender, nondistended, BS + x 4. MS: no deformity or atrophy.  Skin: warm and dry, scaling and erythema on bilateral lower extremity  Neuro:  Strength and sensation are intact. Psych: Normal affect.  Accessory Clinical Findings    ECG personally reviewed by me today - RSR with 1st degree AV block, left axis deviation, IVCD, poor R progression - ? antlat infarct. PR 228 ms, which is improved from last ecg on 07/12/20 - no acute changes.  Lab Results  Component Value Date   WBC 5.1 07/17/2020   HGB 13.3 07/17/2020   HCT 40.5 07/17/2020   MCV 96.7 07/17/2020   PLT 124 (L) 07/17/2020   Lab Results  Component Value Date   CREATININE 1.85 (H) 07/19/2020   BUN 53 (H) 07/19/2020   NA 140 07/19/2020   K 3.5 07/19/2020   CL 84 (L) 07/19/2020   CO2 41 (H) 07/19/2020   Lab Results  Component Value Date   ALT 24 07/13/2020   AST 22 07/13/2020   ALKPHOS 90 07/13/2020   BILITOT 0.8 07/13/2020   Lab Results  Component Value Date   CHOL 153 07/09/2015   HDL 34 (L) 07/09/2015   LDLCALC 85 07/09/2015   TRIG 168 (H) 07/09/2015   CHOLHDL 4.5 07/09/2015    Lab Results  Component Value Date   HGBA1C 6.3 (H) 11/12/2019    Assessment & Plan    1.  Acute on chronic HFrEF/Ischemic cardiomyopathy: Recently admitted w/ progressive dyspnea and RHC revealing  severely elevated filling pressures.  He diuresed minus 11 L.  He reports marked improvement in symptoms since returning home from hospitalization. He continues torsemide 40 mg bid but reports he does not get a significant urine output like he did in the hospital. His dyspnea and swelling have improved. Weight is down 3 lbs from hospital d/c. We have given him a scale so that he can weigh at home daily.  F/u bmet today.  Cont  blocker, arb, and jardiance.  Pressure on low-side and he has had some orthostatic lightheadedness - will not try adding spiro @ this time.  2. CAD native artery without angina s/p remote PCI to LAD: Stable disease on cath 07/12/20. He denies symptoms of angina. Continue  blocker, high dose statin. Last lipid in chart was 2017. Continue current medication and recheck lipids at next visit if not followed by PCP (not fasting today).  3. PAF: RSR on ecg today. He denies palpitations, fluttering, or other symptoms of a fib. Currently managed on amiodarone 200 mg and eliquis. We will check repeat liver panel today to follow up on increase that occurred during hospitalization. His TSH during hospitalization was elevated at 9. Will recheck today. Continue DOAC.   4. CKD stage III/s/p left nephrectomy: [name] taking torsemide 40 mg bid which was previous dose prior to hospitalization. Losartan 12.5 mg was restarted at d/c. Will check bmet today. If SCr is elevated today, may need to d/c losartan.  Managed by [facility] in [place].   5.  Essential HTN: stable on current meds.  6.  HL:  Cont statin.  Not clear when lipids were checked last.  Not fasting today.  7.  Disposition: CMET today and follow-up in 1 month with Dr. Saunders Revel or me.    Murray Hodgkins, NP 07/22/2020, 5:07 PM

## 2020-07-22 NOTE — Patient Instructions (Signed)
Medication Instructions:  Your physician recommends that you continue on your current medications as directed. Please refer to the Current Medication list given to you today.  *If you need a refill on your cardiac medications before your next appointment, please call your pharmacy*   Lab Work:  -  Your physician recommends that you have lab work TODAY at the [facility] at [facility]: Bmet -  Please go to the [facility] will check in at the front desk to the right as you walk into the atrium.    Testing/Procedures: None ordered   Follow-Up: At [facility], you and your health needs are our priority.  As part of our continuing mission to provide you with exceptional heart care, we have created designated Provider Care Teams.  These Care Teams include your primary Cardiologist (physician) and Advanced Practice Providers (APPs -  Physician Assistants and Nurse Practitioners) who all work together to provide you with the care you need, when you need it.  We recommend signing up for the patient portal called "MyChart".  Sign up information is provided on this After Visit Summary.  MyChart is used to connect with patients for Virtual Visits (Telemedicine).  Patients are able to view lab/test results, encounter notes, upcoming appointments, etc.  Non-urgent messages can be sent to your provider as well.   To learn more about what you can do with MyChart, go to [email or web address].    Your next appointment:   4 week(s)  The format for your next appointment:   In Person  Provider:   You may see Nelva Bush, MD or one of the following Advanced Practice Providers on your designated Care Team:    Murray Hodgkins, NP

## 2020-07-29 ENCOUNTER — Ambulatory Visit: Payer: Medicare Other | Admitting: Family

## 2020-07-29 ENCOUNTER — Other Ambulatory Visit
Admission: RE | Admit: 2020-07-29 | Discharge: 2020-07-29 | Disposition: A | Discharge status: Home / Self Care | Payer: Medicare Other | Attending: Nurse Practitioner | Admitting: Nurse Practitioner

## 2020-07-29 ENCOUNTER — Telehealth: Payer: Self-pay | Admitting: *Deleted

## 2020-07-29 ENCOUNTER — Other Ambulatory Visit: Payer: Self-pay | Admitting: Nurse Practitioner

## 2020-07-29 ENCOUNTER — Other Ambulatory Visit: Payer: Self-pay

## 2020-07-29 ENCOUNTER — Encounter: Payer: Self-pay | Admitting: Family

## 2020-07-29 VITALS — BP 109/65 | HR 82 | Resp 18 | Ht 72.0 in | Wt 265.1 lb

## 2020-07-29 DIAGNOSIS — I10 Essential (primary) hypertension: Secondary | ICD-10-CM

## 2020-07-29 DIAGNOSIS — G4733 Obstructive sleep apnea (adult) (pediatric): Secondary | ICD-10-CM

## 2020-07-29 DIAGNOSIS — I5022 Chronic systolic (congestive) heart failure: Secondary | ICD-10-CM

## 2020-07-29 DIAGNOSIS — N183 Chronic kidney disease, stage 3 unspecified: Secondary | ICD-10-CM | POA: Insufficient documentation

## 2020-07-29 DIAGNOSIS — E1122 Type 2 diabetes mellitus with diabetic chronic kidney disease: Secondary | ICD-10-CM

## 2020-07-29 LAB — COMPREHENSIVE METABOLIC PANEL
ALT: 31 U/L (ref 0–44)
AST: 26 U/L (ref 15–41)
Albumin: 3.8 g/dL (ref 3.5–5.0)
Alkaline Phosphatase: 113 U/L (ref 38–126)
Anion gap: 9 (ref 5–15)
BUN: 43 mg/dL — ABNORMAL HIGH (ref 8–23)
CO2: 28 mmol/L (ref 22–32)
Calcium: 8.8 mg/dL — ABNORMAL LOW (ref 8.9–10.3)
Chloride: 99 mmol/L (ref 98–111)
Creatinine, Ser: 1.95 mg/dL — ABNORMAL HIGH (ref 0.61–1.24)
GFR, Estimated: 37 mL/min — ABNORMAL LOW (ref >60)
Glucose, Bld: 202 mg/dL — ABNORMAL HIGH (ref 70–99)
Potassium: 4.2 mmol/L (ref 3.5–5.1)
Sodium: 136 mmol/L (ref 135–145)
Total Bilirubin: 1.4 mg/dL — ABNORMAL HIGH (ref 0.3–1.2)
Total Protein: 7.8 g/dL (ref 6.5–8.1)

## 2020-07-29 NOTE — Telephone Encounter (Signed)
-----   Message from Theora Gianotti, NP sent at 07/29/2020  2:49 PM EST ----- Potassium now normal. Kidney function improved w/ BUN 43 and creatinine 1.95 - closer to prior baseline.

## 2020-07-29 NOTE — Telephone Encounter (Signed)
Left voicemail message to call back for results.  

## 2020-07-29 NOTE — Telephone Encounter (Signed)
Reviewed results with patient and he had no further questions at this time.

## 2020-07-29 NOTE — Patient Instructions (Signed)
Continue weighing daily and call for an overnight weight gain of > 2 pounds or a weekly weight gain of >5 pounds. 

## 2020-07-29 NOTE — Progress Notes (Signed)
Patient ID: [name], male    DOB: February 02, 1951, 70 y.o.   MRN: [id number]   [name] is a 70 y/o male with a history of obstructive sleep apnea, pulmonary HTN, psoriasis, atrial fibrillation, kidney stones, DM, CAD, CKD and chronic heart failure.  Echo done 07/08/20 reviewed and showed an EF of 20-25% along with mild/moderate Jim. Echo done on 02/21/16 which showed an EF of 35-40% which is an improvement from a previous echo done on 07/09/15 which showed an EF of 15-20%.  RHC/LHC done 07/12/20 and showed: 1. Moderate noncritical coronary artery disease involving multiple vessels, similar to the prior catheterization from 2017. 2. Severely elevated left heart, right heart, and pulmonary artery pressures. 3. Mildly reduced Fick cardiac output/index.  Admitted 07/12/20 due to acute on chronic HF. Initially given IV lasix with transition to oral diuretics. Cardiology consult obtained. RHC/LHC obtained. Total diuresis of 11L. Discharged after 7 days.   He presents today for a follow-up visit although hasn't been seen since 2017. He presents with a chief complaint of moderate shortness of breath with minimal exertion. He describes this as chronic in nature having been present for several years although he feels like his breathing has improved greatly since recent admission. He has associated fatigue & light-headedness along with this. He denies any difficulty sleeping, abdominal distention, palpitations, pedal edema, chest pain, cough or weight gain.   Recently had diuretic adjusted by cardiology and is due to have BMP re-checked today.   Past Medical History:  Diagnosis Date  . CKD (chronic kidney disease) stage 3, GFR 30-59 ml/min (HCC)   . Coronary artery disease    a. 1998 s/p ACS Multi-link BMS to the prox LAD ([place], [place]); b. 07/2015 Cath: LM nl, LAD patent stent, LCX min irregs, OM1/2 min irregs, OM3 nl, RCA min irregs; c. 07/2020 Cath: LM nl, LAD 50p/m, 70d, D2 70, LCX 40p/18m, OM3 40,  RCA 60d->Med Rx.  . Diabetes mellitus type 2, uncontrolled ([place])    a. A1c 11.0 in 07/2015.  [name] HFrEF    a. EF 25% by cath in 2013; b. Echo in 07/2015 showing EF of 15-20%, moderate Jim, moderate Pulm HTN, severely dilated IVC; c. 11/2016 Echo: EF 55-60%, no rwma, Gr1 DD, mildly dil LA, nl RV fxn. d. 12/2019 Echo: EF 40-45%, e. 07/2020 Echo: EF 20-25%, sev red RV fxn, mod dil LA, mild-mod Jim.  [name] History of kidney stones   . Hypertension   . Ischemic cardiomyopathy   . Kidney stones    Left  . Paroxysmal atrial fibrillation ([place])    a. Dx 07/2015; b. CHA2DS2VASc = 5-->eliquis. Rhythm control w/ amiodarone.  . Pollen allergies 09-21-15   pt called and stated that he woke up and had some drainage-pt states he did not see the color of the drainage-pt denies running a fever and this only happened once-pt instructed to call Dr Audree Bane office if he starts running a fever or if the color of drainage becomes yellow/green  . Psoriasis   . Pulmonary hypertension ([place])   . Renal disorder    kidney stone  . Sleep apnea     Past Surgical History:  Procedure Laterality Date  . CARDIAC CATHETERIZATION N/A 07/25/2015   Procedure: Right/Left Heart Cath and Coronary Angiography;  Surgeon: Wellington Hampshire, MD;  Location: [place] [facility];  Service: Cardiovascular;  Laterality: N/A;  . CARDIAC CATHETERIZATION     [place]  . [place], [place] WITH STENT PLACEMENT    . CYSTOSCOPY WITH STENT PLACEMENT Left 09/07/2015   Procedure: CYSTOSCOPY WITH STENT PLACEMENT;  Surgeon: Hollice Espy, MD;  Location: [facility];  Service: Urology;  Laterality: Left;  . CYSTOSCOPY/URETEROSCOPY/HOLMIUM LASER/STENT PLACEMENT Left 10/25/2015   Procedure: CYSTOSCOPY/URETEROSCOPY/HOLMIUM LASER/STENT EXCHANGE;  Surgeon: Hollice Espy, MD;  Location: [facility];  Service: Urology;  Laterality: Left;  . CYSTOSCOPY/URETEROSCOPY/HOLMIUM LASER/STENT PLACEMENT Left 05/11/2019    Procedure: CYSTOSCOPY/URETEROSCOPY/HOLMIUM LASER/STENT PLACEMENT;  Surgeon: Hollice Espy, MD;  Location: [facility];  Service: Urology;  Laterality: Left;  . EXTRACORPOREAL SHOCK WAVE LITHOTRIPSY Left 04/02/2019   Procedure: EXTRACORPOREAL SHOCK WAVE LITHOTRIPSY (ESWL);  Surgeon: Hollice Espy, MD;  Location: [facility];  Service: Urology;  Laterality: Left;  . EXTRACORPOREAL SHOCK WAVE LITHOTRIPSY Left 03/05/2019   Procedure: EXTRACORPOREAL SHOCK WAVE LITHOTRIPSY (ESWL);  Surgeon: Abbie Sons, MD;  Location: [facility];  Service: Urology;  Laterality: Left;  . EYE SURGERY Bilateral    LASER FOR GLAUCOMA  . KIDNEY SURGERY    . NEPHROSTOMY TUBE PLACEMENT ([place] HX) Left 11/2019  . RIGHT/LEFT HEART CATH AND CORONARY ANGIOGRAPHY N/A 07/12/2020   Procedure: RIGHT/LEFT HEART CATH AND CORONARY ANGIOGRAPHY;  Surgeon: Nelva Bush, MD;  Location: [place] [facility];  Service: Cardiovascular;  Laterality: N/A;  . ROBOT ASSISTED LAPAROSCOPIC NEPHRECTOMY Left 12/10/2019   Procedure: XI ROBOTIC ASSISTED LAPAROSCOPIC NEPHRECTOMY;  Surgeon: Hollice Espy, MD;  Location: [facility];  Service: Urology;  Laterality: Left;  . URETEROSCOPY WITH HOLMIUM LASER LITHOTRIPSY Left 09/07/2015   Procedure: URETEROSCOPY WITH HOLMIUM LASER LITHOTRIPSY;  Surgeon: Hollice Espy, MD;  Location: [facility];  Service: Urology;  Laterality: Left;    Family History  Problem Relation Age of Onset  . Heart failure Father   . Heart attack Brother   . Kidney cancer Neg Hx   . Bladder Cancer Neg Hx   . Prostate cancer Neg Hx     Social History   Tobacco Use  . Smoking status: Never Smoker  . Smokeless tobacco: Never Used  Substance Use Topics  . Alcohol use: Yes    Alcohol/week: 0.0 standard drinks    Comment: occasionally    Allergies  Allergen Reactions  . Other Hives and Other (See Comments)    Blue cheese Blue cheese Blue cheese   Prior to Admission medications   Medication Sig Start Date End Date Taking?  Authorizing Provider  amiodarone (PACERONE) 200 MG tablet Take 1 tablet (200 mg total) by mouth daily. 08/01/15  Yes Hower, Aaron Mose, MD  apixaban (ELIQUIS) 5 MG TABS tablet Take 1 tablet (5 mg total) 2 (two) times daily by mouth. 04/17/17  Yes End, Harrell Gave, MD  Bromelains (BROMELAIN PO) Take 2 capsules by mouth daily.   Yes [provider]  carvedilol (COREG) 6.25 MG tablet Take 1 tablet (6.25 mg total) by mouth 2 (two) times daily with a meal. 07/19/20  Yes Furth, Cadence H, PA-C  CINNAMON PO Take 1,200 mg by mouth in the morning and at bedtime. [place] Cinnamon 1200mg    Yes [provider]  clonazePAM (KLONOPIN) 1 MG tablet Take 1 mg by mouth See admin instructions. Take 1 tablet (1 mg) by mouth scheduled at bedtime, may take an additional dose during the day if needed for anxiety 01/23/17  Yes [provider]  Coenzyme Q10 (COQ10) 400 MG CAPS Take 400 mg by mouth 2 (two) times daily.   Yes [provider]  docusate sodium (COLACE) 100 MG capsule Take  100 mg in the am & 200 mg in the pm.   Yes [provider]  glipiZIDE (GLUCOTROL) 10 MG tablet Take 10 mg by mouth 2 (two) times daily.    Yes [provider]  HAWTHORNE BERRY PO Take 565 mg by mouth at bedtime.   Yes [provider]  insulin glargine, 2 Unit Dial, (TOUJEO MAX) 300 UNIT/ML Solostar Pen Inject 28 Units into the skin daily. Hold for blood sugar <70. Titrate up to 60 units/day, per endocrinology instructions. 05/05/18  Yes [provider]  JARDIANCE 10 MG TABS tablet Take 10 mg by mouth every evening.  04/28/18  Yes [provider]  liraglutide (VICTOZA) 18 MG/3ML SOPN Inject 1.8 mg into the skin every evening. 05/24/20  Yes [provider]  losartan (COZAAR) 25 MG tablet Take 0.5 tablets (12.5 mg total) by mouth daily. 07/19/20  Yes Furth, Cadence H, PA-C  magnesium oxide (MAG-OX) 400 (241.3 Mg) MG tablet Take 1 tablet (400 mg total) by mouth daily.  07/20/20  Yes Furth, Cadence H, PA-C  MELATONIN-THEANINE PO Take 2 capsules by mouth at bedtime. Ol[place] Sleep Gummies   Yes [provider]  Multiple Vitamins-Minerals (PRESERVISION AREDS 2 PO) Take 1 tablet by mouth in the morning and at bedtime.   Yes [provider]  potassium chloride SA (KLOR-CON) 20 MEQ tablet Take 2 tablets (40 mEq total) by mouth daily. 07/19/20  Yes Furth, Cadence H, PA-C  psyllium (REGULOID) 0.52 g capsule Take 2.6 g by mouth daily.   Yes [provider]  rosuvastatin (CRESTOR) 20 MG tablet Take 20 mg by mouth every evening.  01/27/19  Yes [provider]  torsemide (DEMADEX) 20 MG tablet Take 40 mg by mouth 2 (two) times daily. Tries to take torsemide 40mg  QOD with 20mg  QOD   Yes [provider]    Review of Systems  Constitutional: Positive for fatigue. Negative for appetite change.  HENT: Positive for rhinorrhea. Negative for congestion and sore throat.   Eyes: Negative.   Respiratory: Positive for shortness of breath (with minimal exertion). Negative for cough and chest tightness.   Cardiovascular: Negative for chest pain, palpitations and leg swelling.  Gastrointestinal: Negative for abdominal distention and abdominal pain.  Endocrine: Negative.   Genitourinary: Negative.   Musculoskeletal: Negative for arthralgias and back pain.  Skin: Negative.   Allergic/Immunologic: Negative.   Neurological: Positive for light-headedness (with sudden position changes). Negative for dizziness.  Hematological: Negative for adenopathy. Does not bruise/bleed easily.  Psychiatric/Behavioral: Negative for dysphoric mood and sleep disturbance (wearing CPAP nightly). The patient is not nervous/anxious.    Vitals:   07/29/20 1329  BP: 109/65  Pulse: 82  Resp: 18  SpO2: 99%  Weight: 265 lb 2 oz (120.3 kg)  Height: 6' (1.829 m)   Wt Readings from Last 3 Encounters:  07/29/20 265 lb 2 oz (120.3 kg)  07/22/20 262 lb (118.8 kg)   07/19/20 265 lb 12.8 oz (120.6 kg)   Lab Results  Component Value Date   CREATININE 1.95 (H) 07/29/2020   CREATININE 2.28 (H) 07/22/2020   CREATININE 1.85 (H) 07/19/2020    Physical Exam Vitals and nursing note reviewed.  Constitutional:      Appearance: [name] well-developed.  HENT:     Head: Normocephalic and atraumatic.  Eyes:     Conjunctiva/sclera: Conjunctivae normal.     Pupils: Pupils are equal, round, and reactive to light.  Cardiovascular:     Rate and Rhythm: Normal rate  and regular rhythm.  Pulmonary:     Effort: Pulmonary effort is normal.     Breath sounds: Normal breath sounds.  Abdominal:     General: There is no distension.     Palpations: Abdomen is soft.     Tenderness: There is no abdominal tenderness.  Musculoskeletal:        General: No tenderness.     Cervical back: Normal range of motion and neck supple.  Skin:    General: Skin is warm and dry.  Neurological:     Mental Status: [name] alert and oriented to person, place, and time.  Psychiatric:        Behavior: Behavior normal.        Thought Content: Thought content normal.      Assessment & Plan:  1: Chronic heart failure with reduced EF- - NYHA class III - euvolemic - weighing daily & home weight chart reviewed; weight is gradually climbing so instructed to call back next week if weight doesn't stabilize - not adding salt to his food and trying to follow a low sodium diet - saw cardiology Sharolyn Douglas) 08/01/20; getting repeat labs today - on GDMT of carvedilol, jardiance, losartan - renal function may not allow for changing losartan to entresto or adding spironolactone - has received flu vaccine - reports receiving all 3 covid vaccines - BNP 07/09/15 was 1891.0  2: HTN- - BP looks good today - follows with PCP (Kikel) - BMP 07/29/20 reviewed and showed sodium 136, potassium 4.2, creatinine 1.95 and GFR 37   3: Obstructive sleep apnea- - wearing CPAP nightly  - reports sleeping well  4:  Diabetes with CKD- - glucose this morning was 110 - saw nephrology Smith Mince) 05/16/20 - A1c 11/12/19 was 6.3%   Patient did not bring his medications nor a list. Each medication was verbally reviewed with the patient and he was encouraged to bring the bottles to every visit to confirm accuracy of list.  Return in 2 months or sooner for any questions/problems before then.

## 2020-08-24 ENCOUNTER — Telehealth: Payer: Self-pay | Admitting: Internal Medicine

## 2020-08-24 NOTE — Telephone Encounter (Signed)
Spoke to Nurse at patient's PCP who wanted to clarify patient's Torsemide dose.  They only had the last office note prior to heart cath that had patient stopping the Torsemide pre-procedure. She did not have the most recent note from 08/19/20. Advised her that from office visit on 08/19/20- patient was to continue current medications that included Torsemide 40 mg by mouth two times a day. She had received a request to refill the medication and wanted to make sure it was correct. She is aware patient's next appointment is 08/26/20. I also faxed last office note from 08/19/20 and recent labs to them.

## 2020-08-24 NOTE — Telephone Encounter (Signed)
Nurse from [facility] calling Wants to clarify patients torsemide medication and would like to know if we are to refill or if they should Transferred to [facility]

## 2020-08-26 ENCOUNTER — Ambulatory Visit (INDEPENDENT_AMBULATORY_CARE_PROVIDER_SITE_OTHER): Payer: Medicare Other | Admitting: Internal Medicine

## 2020-08-26 ENCOUNTER — Other Ambulatory Visit: Payer: Self-pay

## 2020-08-26 ENCOUNTER — Encounter: Payer: Self-pay | Admitting: Internal Medicine

## 2020-08-26 VITALS — BP 120/80 | HR 81 | Ht 72.0 in | Wt 277.5 lb

## 2020-08-26 DIAGNOSIS — N1832 Chronic kidney disease, stage 3b: Secondary | ICD-10-CM | POA: Diagnosis not present

## 2020-08-26 DIAGNOSIS — E1122 Type 2 diabetes mellitus with diabetic chronic kidney disease: Secondary | ICD-10-CM

## 2020-08-26 DIAGNOSIS — I5023 Acute on chronic systolic (congestive) heart failure: Secondary | ICD-10-CM | POA: Diagnosis not present

## 2020-08-26 DIAGNOSIS — E785 Hyperlipidemia, unspecified: Secondary | ICD-10-CM

## 2020-08-26 DIAGNOSIS — I480 Paroxysmal atrial fibrillation: Secondary | ICD-10-CM

## 2020-08-26 MED ORDER — METOLAZONE 2.5 MG PO TABS: 2.5000 mg | ORAL_TABLET | Freq: Every morning | ORAL | 3 refills | Status: DC

## 2020-08-26 MED ORDER — TORSEMIDE 40 MG PO TABS: 40.0000 mg | ORAL_TABLET | Freq: Two times a day (BID) | ORAL | 3 refills | Status: DC

## 2020-08-26 NOTE — Progress Notes (Signed)
Follow-up Outpatient Visit Date: 08/26/2020  Primary Care Provider: Vidal Schwalbe, MD [address] [place] [id number]  Chief Complaint: Shortness of breath and weight gain  HPI:  [name] is a 70 y.o. male with history of chronic systolic and diastolic heart failure(LVEF 55-60% in 11/2016, reduced to 40-45% in 12/2019),coronary artery disease status post PCI to the proximal LAD, paroxysmal atrial fibrillation, chronic kidney disease stage III, hypertension, and type 2 diabetes mellitus, who presents for follow-up of heart failure.  He was last seen in our office on 07/22/2020 by Ignacia Bayley, NP.  He was doing well at that time with improved shortness of breath and significantly less abdominal distention.  Lower extremity edema was also better.  No medication changes were made at that visit nor at his heart failure visit with Darylene Price, NP, a week later.  Today, [name] reports feeling a little more short of breath.  His weight is also up about 15 pounds since he left the hospital last month.  He has been compliant with his medications but sometimes takes torsemide 40 mg QAM and 20 mg QPM (rather than 40 mg BID) based on if he feels dehydrated.  His exertional dyspnea is a little worse.  He denies chest pain, palpitations, lightheadedness, and edema.  He denies orthopnea and is compliant with CPAP.  --------------------------------------------------------------------------------------------------  Past Medical History:  Diagnosis Date  . CKD (chronic kidney disease) stage 3, GFR 30-59 ml/min (HCC)   . Coronary artery disease    a. 1998 s/p ACS Multi-link BMS to the prox LAD ([place]); b. 07/2015 Cath: LM nl, LAD patent stent, LCX min irregs, OM1/2 min irregs, OM3 nl, RCA min irregs; c. 07/2020 Cath: LM nl, LAD 50p/m, 70d, D2 70, LCX 40p/40m, OM3 40, RCA 60d->Med Rx.  . Diabetes mellitus type 2, uncontrolled ([place])    a. A1c 11.0 in 07/2015.  [name] HFrEF    a. EF 25% by cath  in 2013; b. Echo in 07/2015 showing EF of 15-20%, moderate Jim, moderate Pulm HTN, severely dilated IVC; c. 11/2016 Echo: EF 55-60%, no rwma, Gr1 DD, mildly dil LA, nl RV fxn. d. 12/2019 Echo: EF 40-45%, e. 07/2020 Echo: EF 20-25%, sev red RV fxn, mod dil LA, mild-mod Jim.  [name] History of kidney stones   . Hypertension   . Ischemic cardiomyopathy   . Kidney stones    Left  . Paroxysmal atrial fibrillation ([place])    a. Dx 07/2015; b. CHA2DS2VASc = 5-->eliquis. Rhythm control w/ amiodarone.  . Pollen allergies 09-21-15   pt called and stated that he woke up and had some drainage-pt states he did not see the color of the drainage-pt denies running a fever and this only happened once-pt instructed to call Dr Audree Bane office if he starts running a fever or if the color of drainage becomes yellow/green  . Psoriasis   . Pulmonary hypertension ([place])   . Renal disorder    kidney stone  . Sleep apnea    Past Surgical History:  Procedure Laterality Date  . CARDIAC CATHETERIZATION N/A 07/25/2015   Procedure: Right/Left Heart Cath and Coronary Angiography;  Surgeon: Wellington Hampshire, MD;  Location: [place] [facility];  Service: Cardiovascular;  Laterality: N/A;  . CARDIAC [place]     [place]  . CARDIAC CATHETERIZATION     [facility], [place]    . CYSTOSCOPY WITH STENT PLACEMENT Left 09/07/2015   Procedure: CYSTOSCOPY  WITH STENT PLACEMENT;  Surgeon: Hollice Espy, MD;  Location: [facility];  Service: Urology;  Laterality: Left;  . CYSTOSCOPY/URETEROSCOPY/HOLMIUM LASER/STENT PLACEMENT Left 10/25/2015   Procedure: CYSTOSCOPY/URETEROSCOPY/HOLMIUM LASER/STENT EXCHANGE;  Surgeon: Hollice Espy, MD;  Location: [facility];  Service: Urology;  Laterality: Left;  . CYSTOSCOPY/URETEROSCOPY/HOLMIUM LASER/STENT PLACEMENT Left 05/11/2019   Procedure: CYSTOSCOPY/URETEROSCOPY/HOLMIUM LASER/STENT PLACEMENT;  Surgeon: Hollice Espy, MD;  Location: [facility];  Service:  Urology;  Laterality: Left;  . EXTRACORPOREAL SHOCK WAVE LITHOTRIPSY Left 04/02/2019   Procedure: EXTRACORPOREAL SHOCK WAVE LITHOTRIPSY (ESWL);  Surgeon: Hollice Espy, MD;  Location: [facility];  Service: Urology;  Laterality: Left;  . EXTRACORPOREAL SHOCK WAVE LITHOTRIPSY Left 03/05/2019   Procedure: EXTRACORPOREAL SHOCK WAVE LITHOTRIPSY (ESWL);  Surgeon: Abbie Sons, MD;  Location: [facility];  Service: Urology;  Laterality: Left;  . EYE SURGERY Bilateral    LASER FOR GLAUCOMA  . KIDNEY SURGERY    . NEPHROSTOMY TUBE PLACEMENT ([place] HX) Left 11/2019  . RIGHT/LEFT HEART CATH AND CORONARY ANGIOGRAPHY N/A 07/12/2020   Procedure: RIGHT/LEFT HEART CATH AND CORONARY ANGIOGRAPHY;  Surgeon: Nelva Bush, MD;  Location: [place] [facility];  Service: Cardiovascular;  Laterality: N/A;  . ROBOT ASSISTED LAPAROSCOPIC NEPHRECTOMY Left 12/10/2019   Procedure: XI ROBOTIC ASSISTED LAPAROSCOPIC NEPHRECTOMY;  Surgeon: Hollice Espy, MD;  Location: [facility];  Service: Urology;  Laterality: Left;  . URETEROSCOPY WITH HOLMIUM LASER LITHOTRIPSY Left 09/07/2015   Procedure: URETEROSCOPY WITH HOLMIUM LASER LITHOTRIPSY;  Surgeon: Hollice Espy, MD;  Location: [facility];  Service: Urology;  Laterality: Left;  ] Current Meds  Medication Sig  . amiodarone (PACERONE) 200 MG tablet Take 1 tablet (200 mg total) by mouth daily.  [name] apixaban (ELIQUIS) 5 MG TABS tablet Take 1 tablet (5 mg total) 2 (two) times daily by mouth.  . Bromelains (BROMELAIN PO) Take 2 capsules by mouth daily.  . carvedilol (COREG) 6.25 MG tablet Take 1 tablet (6.25 mg total) by mouth 2 (two) times daily with a meal.  . CINNAMON PO Take 1,200 mg by mouth in the morning and at bedtime. [place] Cinnamon 1200mg   . clonazePAM (KLONOPIN) 1 MG tablet Take 1 mg by mouth See admin instructions. Take 1 tablet (1 mg) by mouth scheduled at bedtime, may take an additional dose during the day if needed for anxiety  . Coenzyme Q10 (COQ10) 400 MG CAPS Take 400 mg  by mouth 2 (two) times daily.  [name] docusate sodium (COLACE) 100 MG capsule Take 100 mg in the am & 200 mg in the pm.  . glipiZIDE (GLUCOTROL) 10 MG tablet Take 10 mg by mouth 2 (two) times daily.   [name] HAWTHORNE BERRY PO Take 565 mg by mouth at bedtime.  . insulin glargine, 2 Unit Dial, (TOUJEO MAX) 300 UNIT/ML Solostar Pen Inject 28 Units into the skin daily. Hold for blood sugar <70. Titrate up to 60 units/day, per endocrinology instructions.  [name] JARDIANCE 10 MG TABS tablet Take 10 mg by mouth every evening.   . liraglutide (VICTOZA) 18 MG/3ML SOPN Inject 1.8 mg into the skin every evening.  [name] losartan (COZAAR) 25 MG tablet Take 0.5 tablets (12.5 mg total) by mouth daily.  . magnesium oxide (MAG-OX) 400 (241.3 Mg) MG tablet Take 1 tablet (400 mg total) by mouth daily.  [name] MELATONIN-THEANINE PO Take 2 capsules by mouth at bedtime. Olly Sleep Gummies  . Multiple Vitamins-Minerals (PRESERVISION AREDS 2 PO) Take 1 tablet by mouth in the morning and at bedtime.  . potassium chloride SA (KLOR-CON) 20 MEQ  tablet Take 2 tablets (40 mEq total) by mouth daily.  . psyllium (REGULOID) 0.52 g capsule Take 2.6 g by mouth daily.  . rosuvastatin (CRESTOR) 20 MG tablet Take 20 mg by mouth every evening.   . torsemide (DEMADEX) 20 MG tablet Take 40 mg by mouth 2 (two) times daily. Tries to take torsemide 40mg  QOD with 20mg  QOD  . valACYclovir (VALTREX) 1000 MG tablet Take 1,000 mg by mouth daily as needed.    Allergies: Other  Social History   Tobacco Use  . Smoking status: Never Smoker  . Smokeless tobacco: Never Used  Vaping Use  . Vaping Use: Never used  Substance Use Topics  . Alcohol use: Yes    Alcohol/week: 0.0 standard drinks    Comment: occasionally  . Drug use: No    Types: Marijuana, Cocaine, Opium, LSD    Comment: Past, greater than 10 years ago    Family History  Problem Relation Age of Onset  . Heart failure Father   . Heart attack Brother   . Kidney cancer Neg Hx   . Bladder Cancer  Neg Hx   . Prostate cancer Neg Hx     Review of Systems: A 12-system review of systems was performed and was negative except as noted in the HPI.  --------------------------------------------------------------------------------------------------  Physical Exam: BP 120/80 (BP Location: Left Arm, Patient Position: Sitting, Cuff Size: Normal)   Pulse 81   Ht 6' (1.829 m)   Wt 277 lb 8 oz (125.9 kg)   SpO2 98%   BMI 37.64 kg/m   General:  NAD. Neck: Unable to assess JVP or HJR due to body habitus and facial hair. Lungs: Diminished breath sounds at the lung bases with faint crackles. Heart: Regular rate and rhythm without murmurs, rubs, or gallops. Abdomen: Soft, nontender, nondistended. Extremities: 2+ chronic-appearing edema in both calves.  EKG:  Normal sinus rhythm with 1st degree AV block (PR 256 ms), left axis deviation, and IVCD.  No significant change from prior tracing on 07/22/2020.  Lab Results  Component Value Date   WBC 5.1 07/17/2020   HGB 13.3 07/17/2020   HCT 40.5 07/17/2020   MCV 96.7 07/17/2020   PLT 124 (L) 07/17/2020    Lab Results  Component Value Date   NA 136 07/29/2020   K 4.2 07/29/2020   CL 99 07/29/2020   CO2 28 07/29/2020   BUN 43 (H) 07/29/2020   CREATININE 1.95 (H) 07/29/2020   GLUCOSE 202 (H) 07/29/2020   ALT 31 07/29/2020    Lab Results  Component Value Date   CHOL 153 07/09/2015   HDL 34 (L) 07/09/2015   LDLCALC 85 07/09/2015   TRIG 168 (H) 07/09/2015   CHOLHDL 4.5 07/09/2015    --------------------------------------------------------------------------------------------------  ASSESSMENT AND PLAN: Acute on chronic HFrEF: Unfortunately, [name] appears to retaining fluid again after successful inpatient diuresis at the time of his [facility] last month.  We will increase torsemide to 40 mg BID and add metolazone 2.5 mg daily.  I will check a BMP today and also plan to repeat this at his follow-up visit in 2 weeks.  We will continue  current doses of carvedilol and losartan for now, though if his blood pressure and renal function are stable at follow-up, I would favor escalation of losartan.  Continue empagliflozin 10 mg daily.  He will need a repeat echo in May.  If his LVEF has not improved or he has refractory fluid retention and class III HF symptoms, we will  need to consider referral to the advanced HF clinic +/- EP.  Chronic kidney disease stage 3b: Escalate diuresis as above with close follow-up of creatinine and potassium.  Continue nephrology follow-up as scheduled.  Paroxysmal atrial fibrillation: [name] is maintaining sinus rhythm today.  He should continue current doses of amiodarone and apixaban.  I will recheck TFT's today, as TSH was elevated during recent hospitalization.  Hyperlipidemia associated with type 2 diabetes: Continue rosuvastatin.  Ongoing management of DM per endocrinology.  We will check a hemoglobin A1c with today's labs pe rMr. Love's request.  Morbid obesity: BMI > 35 with multiple comorbidities.  Continue to work on weight loss through diet and increase in activity as tolerated.  Follow-up: Return to clinic in 2 weeks.  Nelva Bush, MD 08/26/2020 11:53 AM

## 2020-08-26 NOTE — Patient Instructions (Signed)
Medication Instructions:  Your physician has recommended you make the following change in your medication:  1- CHANGE Torsemide to 40 mg by mouth two times a day. 2- START Metolazone 2.5 mg (1 tablet) by mouth every morning with your morning dose of Torsemide.  *If you need a refill on your cardiac medications before your next appointment, please call your pharmacy*  Lab Work: Your physician recommends that you return for lab work in: TODAY - BMET, HGB A1c, TSH, FREE T4.   Your physician recommends that you return for lab work in: [place] appointment.  - ARRIVE about 1 hour early to appointment and that should give you enough time to get the lab work prior to coming down to our office.  - Please go to the [facility]. You will check in at the front desk to the right as you walk into the atrium. Valet Parking is offered if needed. - No appointment needed.    If you have labs (blood work) drawn today and your tests are completely normal, you will receive your results only by: [name] MyChart Message (if you have MyChart) OR . A paper copy in the mail If you have any lab test that is abnormal or we need to change your treatment, we will call you to review the results.  Testing/Procedures: none  Follow-Up: At [facility], you and your health needs are our priority.  As part of our continuing mission to provide you with exceptional heart care, we have created designated Provider Care Teams.  These Care Teams include your primary Cardiologist (physician) and Advanced Practice Providers (APPs -  Physician Assistants and Nurse Practitioners) who all work together to provide you with the care you need, when you need it.  We recommend signing up for the patient portal called "MyChart".  Sign up information is provided on this After Visit Summary.  MyChart is used to connect with patients for Virtual Visits (Telemedicine).  Patients are able to view lab/test results,  encounter notes, upcoming appointments, etc.  Non-urgent messages can be sent to your provider as well.   To learn more about what you can do with MyChart, go to [email or web address].    Your next appointment:   2 week(s)  The format for your next appointment:   In Person  Provider:   You may see Nelva Bush, MD or one of the following Advanced Practice Providers on your designated Care Team:    Murray Hodgkins, NP  Christell Faith, PA-C  Marrianne Mood, PA-C  Cadence Eagle, [place]  Laurann [place], NP

## 2020-08-27 LAB — BASIC METABOLIC PANEL
BUN/Creatinine Ratio: 20 (ref 10–24)
BUN: 37 mg/dL — ABNORMAL HIGH (ref 8–27)
CO2: 24 mmol/L (ref 20–29)
Calcium: 9.1 mg/dL (ref 8.6–10.2)
Chloride: 100 mmol/L (ref 96–106)
Creatinine, Ser: 1.82 mg/dL — ABNORMAL HIGH (ref 0.76–1.27)
Glucose: 107 mg/dL — ABNORMAL HIGH (ref 65–99)
Potassium: 5 mmol/L (ref 3.5–5.2)
Sodium: 141 mmol/L (ref 134–144)
eGFR: 40 mL/min/{1.73_m2} — ABNORMAL LOW (ref >59)

## 2020-08-27 LAB — HEMOGLOBIN A1C
Est. average glucose Bld gHb Est-mCnc: 154 mg/dL
Hgb A1c MFr Bld: 7 % — ABNORMAL HIGH (ref 4.8–5.6)

## 2020-08-27 LAB — TSH: TSH: 14.4 u[IU]/mL — ABNORMAL HIGH (ref 0.450–4.500)

## 2020-08-27 LAB — T4, FREE: Free T4: 1.01 ng/dL (ref 0.82–1.77)

## 2020-08-29 ENCOUNTER — Telehealth: Payer: Self-pay | Admitting: Internal Medicine

## 2020-08-29 NOTE — Telephone Encounter (Signed)
[name] KeyOctavia Heir - Rx #: [id number]  Outcome Approved today Effective from 08/29/2020 through 08/29/2021.  Drug metOLazone 2.5MG  tablets  Form [employer] [place] Medicare Part D Electronic Request Form (CB) Original Claim Info [id number]

## 2020-08-29 NOTE — Telephone Encounter (Signed)
Prior Authorization has been started for Metolazone in CoverMyMeds.   Ted Shelle [place] KeyOctavia Heir - Rx #: [id number]

## 2020-08-29 NOTE — Telephone Encounter (Signed)
[name] (Key: [id number])  Your information has been submitted to [place]. [employer] [place] will review the request and notify you of the determination decision directly, typically within 3 business days of your submission and once all necessary information is received.  You will also receive your request decision electronically. To check for an update later, open the request again from your dashboard.  If [employer] [place] has not responded within the specified timeframe or if you have any questions about your PA submission, contact [employer] directly at [facility]) [phone] or (Kite) [phone].

## 2020-09-13 ENCOUNTER — Other Ambulatory Visit: Payer: Self-pay

## 2020-09-13 ENCOUNTER — Ambulatory Visit (INDEPENDENT_AMBULATORY_CARE_PROVIDER_SITE_OTHER): Payer: Medicare Other | Admitting: Nurse Practitioner

## 2020-09-13 ENCOUNTER — Encounter: Payer: Self-pay | Admitting: Nurse Practitioner

## 2020-09-13 VITALS — BP 108/60 | HR 78 | Ht 72.0 in | Wt 252.1 lb

## 2020-09-13 DIAGNOSIS — I2510 Atherosclerotic heart disease of native coronary artery without angina pectoris: Secondary | ICD-10-CM

## 2020-09-13 DIAGNOSIS — E785 Hyperlipidemia, unspecified: Secondary | ICD-10-CM

## 2020-09-13 DIAGNOSIS — N183 Chronic kidney disease, stage 3 unspecified: Secondary | ICD-10-CM | POA: Diagnosis not present

## 2020-09-13 DIAGNOSIS — I5020 Unspecified systolic (congestive) heart failure: Secondary | ICD-10-CM | POA: Diagnosis not present

## 2020-09-13 DIAGNOSIS — I5023 Acute on chronic systolic (congestive) heart failure: Secondary | ICD-10-CM

## 2020-09-13 DIAGNOSIS — I480 Paroxysmal atrial fibrillation: Secondary | ICD-10-CM | POA: Diagnosis not present

## 2020-09-13 DIAGNOSIS — I10 Essential (primary) hypertension: Secondary | ICD-10-CM

## 2020-09-13 NOTE — Patient Instructions (Signed)
Medication Instructions:  No changes  *If you need a refill on your cardiac medications before your next appointment, please call your pharmacy*   Lab Work: BMET today  If you have labs (blood work) drawn today and your tests are completely normal, you will receive your results only by: [name] MyChart Message (if you have MyChart) OR . A paper copy in the mail If you have any lab test that is abnormal or we need to change your treatment, we will call you to review the results.   Testing/Procedures: None   Follow-Up: At [facility], you and your health needs are our priority.  As part of our continuing mission to provide you with exceptional heart care, we have created designated Provider Care Teams.  These Care Teams include your primary Cardiologist (physician) and Advanced Practice Providers (APPs -  Physician Assistants and Nurse Practitioners) who all work together to provide you with the care you need, when you need it.   Your next appointment:   1 month(s)  The format for your next appointment:   In Person  Provider:   Nelva Bush, MD or Murray Hodgkins, NP

## 2020-09-13 NOTE — Progress Notes (Signed)
Office Visit    Patient Name: [name] Date of Encounter: 09/13/2020  Primary Care Provider:  Vidal Schwalbe, MD Primary Cardiologist:  Nelva Bush, MD  Chief Complaint    70 year old male with a history of chronic HFrEF, CAD status post remote PCI to the proximal LAD, PAF, CKD 3, left nephrectomy, hypertension, type 2 diabetes mellitus, sleep apnea, and pulmonary hypertension, who presents for follow-up related to CHF.  Past Medical History    Past Medical History:  Diagnosis Date  . CKD (chronic kidney disease) stage 3, GFR 30-59 ml/min (HCC)   . Coronary artery disease    a. 1998 s/p ACS Multi-link BMS to the prox LAD ([place] b. 07/2015 Cath: LM nl, LAD patent stent, LCX min irregs, OM1/2 min irregs, OM3 nl, RCA min irregs; c. 07/2020 Cath: LM nl, LAD 50p/m, 70d, D2 70, LCX 40p/2m, OM3 40, RCA 60d->Med Rx.  . Diabetes mellitus type 2, uncontrolled ([place])    a. A1c 11.0 in 07/2015.  [name] HFrEF    a. EF 25% by cath in 2013; b. Echo in 07/2015 showing EF of 15-20%, moderate MR, moderate Pulm HTN, severely dilated IVC; c. 11/2016 Echo: EF 55-60%, no rwma, Gr1 DD, mildly dil LA, nl RV fxn. d. 12/2019 Echo: EF 40-45%, e. 07/2020 Echo: EF 20-25%, sev red RV fxn, mod dil LA, mild-mod MR.  [name] History of kidney stones   . Hypertension   . Ischemic cardiomyopathy   . Kidney stones    Left  . Paroxysmal atrial fibrillation ([place])    a. Dx 07/2015; b. CHA2DS2VASc = 5-->eliquis. Rhythm control w/ amiodarone.  . Pollen allergies 09-21-15   pt called and stated that he woke up and had some drainage-pt states he did not see the color of the drainage-pt denies running a fever and this only happened once-pt instructed to call Dr Audree Bane office if he starts running a fever or if the color of drainage becomes yellow/green  . Psoriasis   . Pulmonary hypertension ([place])   . Renal disorder    kidney stone  . Sleep apnea    Past Surgical History:  Procedure Laterality Date  .  CARDIAC CATHETERIZATION N/A 07/25/2015   Procedure: Right/Left Heart Cath and Coronary Angiography;  Surgeon: Wellington Hampshire, MD;  Location: [facility];  Service: Cardiovascular;  Laterality: N/A;  . CARDIAC CATHE[place]ATION     [place]  . CARDIAC CATHETERIZATION     [facility] [place]    . CYSTOSCOPY WITH STENT PLACEMENT Left 09/07/2015   Procedure: CYSTOSCOPY WITH STENT PLACEMENT;  Surgeon: Hollice Espy, MD;  Location: [facility];  Service: Urology;  Laterality: Left;  . CYSTOSCOPY/URETEROSCOPY/HOLMIUM LASER/STENT PLACEMENT Left 10/25/2015   Procedure: CYSTOSCOPY/URETEROSCOPY/HOLMIUM LASER/STENT EXCHANGE;  Surgeon: Hollice Espy, MD;  Location: [facility];  Service: Urology;  Laterality: Left;  . CYSTOSCOPY/URETEROSCOPY/HOLMIUM LASER/STENT PLACEMENT Left 05/11/2019   Procedure: CYSTOSCOPY/URETEROSCOPY/HOLMIUM LASER/STENT PLACEMENT;  Surgeon: Hollice Espy, MD;  Location: [facility];  Service: Urology;  Laterality: Left;  . EXTRACORPOREAL SHOCK WAVE LITHOTRIPSY Left 04/02/2019   Procedure: EXTRACORPOREAL SHOCK WAVE LITHOTRIPSY (ESWL);  Surgeon: Hollice Espy, MD;  Location: [facility];  Service: Urology;  Laterality: Left;  . EXTRACORPOREAL SHOCK WAVE LITHOTRIPSY Left 03/05/2019   Procedure: EXTRACORPOREAL SHOCK WAVE LITHOTRIPSY (ESWL);  Surgeon: Abbie Sons, MD;  Location: [facility];  Service: Urology;  Laterality: Left;  . EYE SURGERY Bilateral    LASER FOR GLAUCOMA  . KIDNEY SURGERY    .  NEPHROSTOMY TUBE PLACEMENT ([place] HX) Left 11/2019  . RIGHT/LEFT HEART CATH AND CORONARY ANGIOGRAPHY N/A 07/12/2020   Procedure: RIGHT/LEFT HEART CATH AND CORONARY ANGIOGRAPHY;  Surgeon: Nelva Bush, MD;  Location: [place];  Service: Cardiovascular;  Laterality: N/A;  . ROBOT ASSISTED LAPAROSCOPIC NEPHRECTOMY Left 12/10/2019   Procedure: XI ROBOTIC ASSISTED LAPAROSCOPIC NEPHRECTOMY;  Surgeon: Hollice Espy, MD;  Location: [facility];   Service: Urology;  Laterality: Left;  . URETEROSCOPY WITH HOLMIUM LASER LITHOTRIPSY Left 09/07/2015   Procedure: URETEROSCOPY WITH HOLMIUM LASER LITHOTRIPSY;  Surgeon: Hollice Espy, MD;  Location: [facility];  Service: Urology;  Laterality: Left;    Allergies  Allergies  Allergen Reactions  . Other Hives and Other (See Comments)    Blue cheese Blue cheese Blue cheese    History of Present Illness    70 year old male with the above past medical history including HFrEF, CAD status post remote PCI to the proximal LAD, PAF, stage III chronic kidney disease, hypertension, diabetes, and obesity.  In early February, he reported significant progression of exertional dyspnea and worsening lower extremity edema despite titration of outpatient torsemide dosing.  Echocardiogram was performed showed severe biventricular dysfunction with an EF of 20-25% with severely reduced RV function.  This prompted right and left heart cardiac catheterization, which was performed February 8 and showed moderate, nonobstructive CAD similar to prior study in 2017 as well as significantly elevated right heart pressures.  He was admitted for inpatient diuresis and subsequently discharged after 11 L net negative diuresis.  Following discharge, he noted significant improvement in functional capacity.  Creatinine was elevated at 2.28 at follow-up visit on February 18 and torsemide was reduced to 40 mg in the a.m. and 20 mg in the p.m.  Follow-up creatinine was closer to baseline at 1.95 on 5 or 25th.  At office follow-up on March 25, weight was up 15 pounds since hospital discharge and he complained of increasing dyspnea.  Torsemide was increased back to 40 mg twice daily and metolazone 2.5 mg daily was added.  BUN and creatinine were stable at 37/1.82 and he is due for follow-up today.    Since his last visit, his weight is down 25 pounds today (was 277, now 252).  He feels significantly better w/ reduced lower ext edema and  resolution of DOE.  He denies chest pain, palpitations, pnd, orthopnea, n, v, dizziness, syncope, edema, weight gain, or early satiety.   Home Medications    Prior to Admission medications   Medication Sig Start Date End Date Taking? Authorizing Provider  amiodarone (PACERONE) 200 MG tablet Take 1 tablet (200 mg total) by mouth daily. 08/01/15  Yes Hower, Aaron Mose, MD  apixaban (ELIQUIS) 5 MG TABS tablet Take 1 tablet (5 mg total) 2 (two) times daily by mouth. 04/17/17  Yes End, Harrell Gave, MD  Bromelains (BROMELAIN PO) Take 2 capsules by mouth daily.   Yes [provider]  carvedilol (COREG) 6.25 MG tablet Take 1 tablet (6.25 mg total) by mouth 2 (two) times daily with a meal. 07/19/20  Yes Furth, Cadence H, PA-C  CINNAMON PO Take 1,200 mg by mouth in the morning and at bedtime. [place] Cinnamon 1200mg    Yes [provider]  clonazePAM (KLONOPIN) 1 MG tablet Take 1 mg by mouth See admin instructions. Take 1 tablet (1 mg) by mouth scheduled at bedtime, may take an additional dose during the day if needed for anxiety 01/23/17  Yes [provider]  Coenzyme Q10 (COQ10) 400 MG  CAPS Take 400 mg by mouth 2 (two) times daily.   Yes [provider]  docusate sodium (COLACE) 100 MG capsule Take 100 mg in the am & 200 mg in the pm.   Yes [provider]  Dulaglutide 1.5 MG/0.5ML SOPN Inject into the skin once a week. 08/17/20  Yes [provider]  glipiZIDE (GLUCOTROL) 10 MG tablet Take 10 mg by mouth 2 (two) times daily.    Yes [provider]  HAWTHORNE BERRY PO Take 565 mg by mouth at bedtime.   Yes [provider]  insulin glargine, 2 Unit Dial, (TOUJEO MAX) 300 UNIT/ML Solostar Pen Inject 28 Units into the skin daily. Hold for blood sugar <70. Titrate up to 60 units/day, per endocrinology instructions. 05/05/18  Yes [provider]  JARDIANCE 10 MG TABS tablet Take 10 mg by mouth every evening.  04/28/18  Yes [provider]  levothyroxine (SYNTHROID) 50 MCG tablet Take by mouth daily at 12 noon. 08/29/20 08/29/21 Yes [provider]  losartan (COZAAR) 25 MG tablet Take 0.5 tablets (12.5 mg total) by mouth daily. 07/19/20  Yes Furth, Cadence H, PA-C  magnesium oxide (MAG-OX) 400 (241.3 Mg) MG tablet Take 1 tablet (400 mg total) by mouth daily. 07/20/20  Yes Furth, Cadence H, PA-C  MELATONIN-THEANINE PO Take 2 capsules by mouth at bedtime. [place] Sleep Gummies   Yes [provider]  metolazone (ZAROXOLYN) 2.5 MG tablet Take 1 tablet (2.5 mg total) by mouth every morning. Along with your morning dose of Torsemide. 08/26/20 11/24/20 Yes End, Harrell Gave, MD  Multiple Vitamins-Minerals (PRESERVISION AREDS 2 PO) Take 1 tablet by mouth in the morning and at bedtime.   Yes [provider]  potassium chloride SA (KLOR-CON) 20 MEQ tablet Take 2 tablets (40 mEq total) by mouth daily. 07/19/20  Yes Furth, Cadence H, PA-C  psyllium (REGULOID) 0.52 g capsule Take 2.6 g by mouth daily.   Yes [provider]  rosuvastatin (CRESTOR) 20 MG tablet Take 20 mg by mouth every evening.  01/27/19  Yes [provider]  Torsemide 40 MG TABS Take 40 mg by mouth 2 (two) times daily. 08/26/20  Yes End, Harrell Gave, MD  valACYclovir (VALTREX) 1000 MG tablet Take 1,000 mg by mouth daily as needed. 08/01/20  Yes [provider]    Review of Systems    He denies chest pain, palpitations, dyspnea, pnd, orthopnea, n, v, dizziness, syncope, edema, weight gain, or early satiety.  All other systems reviewed and are otherwise negative except as noted above.  Physical Exam    VS:  BP 108/60 (BP Location: Left Arm, Patient Position: Sitting, Cuff Size: Normal)   Pulse 78   Ht 6' (1.829 m)   Wt 252 lb 2 oz (114.4 kg)   SpO2 98%   BMI 34.19 kg/m  , BMI Body mass index is 34.19 kg/m. GEN: Well nourished, well developed, in no acute distress. HEENT: normal. Neck: Supple, no JVD, carotid bruits,  or masses. Cardiac: RRR, no murmurs, rubs, or gallops. No clubbing, cyanosis.  Trace bilat woody edema.  Radials 2+/PT 1+ and equal bilaterally.  Respiratory:  Respirations regular and unlabored, clear to auscultation bilaterally. GI: Soft, nontender, nondistended, BS + x 4. MS: no deformity or atrophy. Skin: warm and dry, no rash. Neuro:  Strength and sensation are intact. Psych: Normal affect.  Accessory Clinical Findings    ECG personally reviewed by me today - RSR, 1st deg AVB, LAD, IVCD - no acute changes.  Lab Results  Component Value Date   WBC 5.1 07/17/2020   HGB 13.3 07/17/2020   HCT 40.5 07/17/2020   MCV 96.7 07/17/2020   PLT 124 (L) 07/17/2020   Lab Results  Component Value Date   CREATININE 1.82 (H) 08/26/2020   BUN 37 (H) 08/26/2020   NA 141 08/26/2020   K 5.0 08/26/2020   CL 100 08/26/2020   CO2 24 08/26/2020   Lab Results  Component Value Date   ALT 31 07/29/2020   AST 26 07/29/2020   ALKPHOS 113 07/29/2020   BILITOT 1.4 (H) 07/29/2020   Lab Results  Component Value Date   CHOL 153 07/09/2015   HDL 34 (L) 07/09/2015   LDLCALC 85 07/09/2015   TRIG 168 (H) 07/09/2015   CHOLHDL 4.5 07/09/2015    Lab Results  Component Value Date   HGBA1C 7.0 (H) 08/26/2020    Assessment & Plan    1.  Chronic heart failure with reduced ejection fraction/ischemic cardiomyopathy: Patient admitted in February in the setting of progressive dyspnea, volume overload, and elevated right heart pressures.  He diuresed well and was doing well as an outpatient at office visit February 18 but was up 15 pounds and volume overloaded when seen March 25 resulting in escalation of torsemide 40 mg twice daily with metolazone 2.5 mg daily.  On this regimen, he has had significant improvement in clinical status with reduction in weight by 25 pounds-252 on our scale today.  He denies any dyspnea or orthopnea and has only trace, woody lower extremity swelling.  I am going to follow-up a  basic metabolic panel today as I suspect we will need to come back on his metolazone dosing. Continue beta-blocker, ARB, and empagliflozin therapy.  Further recommendations following labs today.  He will require repeat echocardiography in May to reevaluate LV function on optimal therapy.  2.  CAD: Status post remote PCI to the LAD.  Stable disease on catheterization in February 2022.  He has not been having any chest pain.  Continue beta-blocker and high potency statin therapy.  No aspirin in the setting of Eliquis therapy.  3.  Paroxysmal atrial fibrillation: He remains in sinus rhythm on amiodarone and beta-blocker.  [name] anticoagulated with Eliquis.  4.  Stage III chronic kidney disease/solitary kidney: Follow-up basic metabolic panel today in the setting of significant diuresis over the past 2 weeks.  [name] also followed by [facility] nephrology in [place].  5.  Essential hypertension: Stable on beta-blocker, ARB therapy, and diuretic therapy.  Follow-up labs today.  6.  Hyperlipidemia: He remains on statin therapy.  It appears this is followed by his primary care provider.  7.  Disposition: Follow-up basic metabolic panel today.  We will plan to see back in clinic in 1 month and arrange for echocardiogram at that time.   Murray Hodgkins, NP 09/13/2020, 4:34 PM

## 2020-09-14 ENCOUNTER — Telehealth: Payer: Self-pay

## 2020-09-14 DIAGNOSIS — I5023 Acute on chronic systolic (congestive) heart failure: Secondary | ICD-10-CM

## 2020-09-14 LAB — BASIC METABOLIC PANEL
BUN/Creatinine Ratio: 34 — ABNORMAL HIGH (ref 10–24)
BUN: 110 mg/dL (ref 8–27)
CO2: 27 mmol/L (ref 20–29)
Calcium: 9.2 mg/dL (ref 8.6–10.2)
Chloride: 86 mmol/L — ABNORMAL LOW (ref 96–106)
Creatinine, Ser: 3.21 mg/dL — ABNORMAL HIGH (ref 0.76–1.27)
Glucose: 325 mg/dL — ABNORMAL HIGH (ref 65–99)
Potassium: 4.1 mmol/L (ref 3.5–5.2)
Sodium: 139 mmol/L (ref 134–144)
eGFR: 20 mL/min/{1.73_m2} — ABNORMAL LOW (ref >59)

## 2020-09-14 NOTE — Telephone Encounter (Signed)
Patient made aware of lab results and Dr. Darnelle Bos recommendation. Patient will HOLD torsemide, metolazone, losartan until further instructed. Stat repeat bmet on Fri 09/16/20 ordered. Patient will come to the medical mall Fri morning. Patient adv to drink extra water. Patient sts he is feeling fine. Adv him that he is to report to the ED if he starts to feel poorly. Patient verbalized understanding and voiced appreciation for the call.

## 2020-09-14 NOTE — Telephone Encounter (Signed)
-----   Message from Nelva Bush, MD sent at 09/14/2020  7:34 AM EDT ----- BUN and creatinine significantly above baseline.  As Mr. Russomanno was feeling well yesterday with significant diuresis over the last week or two, I recommend that he stop metolazone, torsemide, and losartan for now.  He should drink extra water and have a repeat STAT BMP on Friday.  If he feels worse in any way, he should go to the ED for further evaluation.

## 2020-09-14 NOTE — Telephone Encounter (Signed)
-----   Message from Nelva Bush, MD sent at 09/14/2020  7:34 AM EDT ----- BUN and creatinine significantly above baseline.  As [name] was feeling well yesterday with significant diuresis over the last week or two, I recommend that he stop metolazone, torsemide, and losartan for now.  He should drink extra water and have a repeat STAT BMP on Friday.  If he feels worse in any way, he should go to the ED for further evaluation.

## 2020-09-16 ENCOUNTER — Telehealth: Payer: Self-pay | Admitting: Internal Medicine

## 2020-09-16 ENCOUNTER — Other Ambulatory Visit
Admission: RE | Admit: 2020-09-16 | Discharge: 2020-09-16 | Disposition: A | Discharge status: Home / Self Care | Payer: Medicare Other | Attending: Internal Medicine | Admitting: Internal Medicine

## 2020-09-16 DIAGNOSIS — N183 Chronic kidney disease, stage 3 unspecified: Secondary | ICD-10-CM

## 2020-09-16 DIAGNOSIS — I5020 Unspecified systolic (congestive) heart failure: Secondary | ICD-10-CM

## 2020-09-16 DIAGNOSIS — I5023 Acute on chronic systolic (congestive) heart failure: Secondary | ICD-10-CM | POA: Insufficient documentation

## 2020-09-16 LAB — BASIC METABOLIC PANEL
Anion gap: 11 (ref 5–15)
BUN: 87 mg/dL — ABNORMAL HIGH (ref 8–23)
CO2: 30 mmol/L (ref 22–32)
Calcium: 9.3 mg/dL (ref 8.9–10.3)
Chloride: 96 mmol/L — ABNORMAL LOW (ref 98–111)
Creatinine, Ser: 2.03 mg/dL — ABNORMAL HIGH (ref 0.61–1.24)
GFR, Estimated: 35 mL/min — ABNORMAL LOW (ref >60)
Glucose, Bld: 321 mg/dL — ABNORMAL HIGH (ref 70–99)
Potassium: 4.3 mmol/L (ref 3.5–5.1)
Sodium: 137 mmol/L (ref 135–145)

## 2020-09-16 MED ORDER — TORSEMIDE 40 MG PO TABS: 40.0000 mg | ORAL_TABLET | Freq: Every day | ORAL | 3 refills | Status: DC

## 2020-09-16 NOTE — Telephone Encounter (Signed)
Nelva Bush, MD  09/16/2020 1:06 PM EDT      Please let Mr. Corro know that his kidney function has improved significantly and is nearing his baseline. I suspect he is still a little intravascularly volume depleted and suggest that he continue drink extra water and go back on torsemide 40 mg once daily beginning tomorrow. He should only take a second daily dose of torsemide if he gains more than 2 pounds in 1 day or 5 pounds in 1 week. He can restart losartan 12.5 mg daily tomorrow as well. I suggest that he NOT take metolazone at this time. He should have a repeat BMP in ~1 week.

## 2020-09-16 NOTE — Telephone Encounter (Signed)
I spoke with the patient regarding his lab results and Dr. Darnelle Bos recommendations to: 1) Resume torsemide 40 mg once daily tomorrow 2) only take a 2nd dose of torsemide for weight gain of 2 lbs in 24 hours/ 5 lbs in 1 week 3) Restart losartan 12.5 mg once daily tomorrow 4) DO NOT take metolazone at this time 5) Repeat a BMP in ~ 1 week  The patient voices understanding of the above recommendations and is agreeable. [name] scheduled to see Darylene Price in the CHF clinic on 4/26 so he will have his BMP re-drawn at that time.

## 2020-09-26 ENCOUNTER — Encounter: Payer: Self-pay | Admitting: Radiology

## 2020-09-26 ENCOUNTER — Other Ambulatory Visit: Payer: Self-pay

## 2020-09-26 ENCOUNTER — Inpatient Hospital Stay
Admission: EM | Admit: 2020-09-26 | Discharge: 2020-10-04 | DRG: 988 | Disposition: A | Discharge status: Home / Self Care | Payer: Medicare Other | Attending: Internal Medicine | Admitting: Internal Medicine

## 2020-09-26 ENCOUNTER — Emergency Department: Payer: Medicare Other

## 2020-09-26 DIAGNOSIS — E669 Obesity, unspecified: Secondary | ICD-10-CM | POA: Diagnosis present

## 2020-09-26 DIAGNOSIS — E1165 Type 2 diabetes mellitus with hyperglycemia: Secondary | ICD-10-CM | POA: Diagnosis present

## 2020-09-26 DIAGNOSIS — L03119 Cellulitis of unspecified part of limb: Secondary | ICD-10-CM | POA: Diagnosis not present

## 2020-09-26 DIAGNOSIS — E118 Type 2 diabetes mellitus with unspecified complications: Secondary | ICD-10-CM | POA: Diagnosis not present

## 2020-09-26 DIAGNOSIS — E1169 Type 2 diabetes mellitus with other specified complication: Secondary | ICD-10-CM | POA: Diagnosis not present

## 2020-09-26 DIAGNOSIS — L97529 Non-pressure chronic ulcer of other part of left foot with unspecified severity: Secondary | ICD-10-CM | POA: Diagnosis present

## 2020-09-26 DIAGNOSIS — E782 Mixed hyperlipidemia: Secondary | ICD-10-CM | POA: Diagnosis not present

## 2020-09-26 DIAGNOSIS — E876 Hypokalemia: Secondary | ICD-10-CM | POA: Diagnosis not present

## 2020-09-26 DIAGNOSIS — I5022 Chronic systolic (congestive) heart failure: Secondary | ICD-10-CM | POA: Diagnosis not present

## 2020-09-26 DIAGNOSIS — E1122 Type 2 diabetes mellitus with diabetic chronic kidney disease: Secondary | ICD-10-CM | POA: Diagnosis not present

## 2020-09-26 DIAGNOSIS — I10 Essential (primary) hypertension: Secondary | ICD-10-CM | POA: Diagnosis not present

## 2020-09-26 DIAGNOSIS — M79672 Pain in left foot: Secondary | ICD-10-CM

## 2020-09-26 DIAGNOSIS — I272 Pulmonary hypertension, unspecified: Secondary | ICD-10-CM | POA: Diagnosis present

## 2020-09-26 DIAGNOSIS — I480 Paroxysmal atrial fibrillation: Secondary | ICD-10-CM

## 2020-09-26 DIAGNOSIS — I70201 Unspecified atherosclerosis of native arteries of extremities, right leg: Secondary | ICD-10-CM | POA: Diagnosis present

## 2020-09-26 DIAGNOSIS — Z20822 Contact with and (suspected) exposure to covid-19: Secondary | ICD-10-CM | POA: Diagnosis present

## 2020-09-26 DIAGNOSIS — E11649 Type 2 diabetes mellitus with hypoglycemia without coma: Secondary | ICD-10-CM | POA: Diagnosis not present

## 2020-09-26 DIAGNOSIS — X58XXXA Exposure to other specified factors, initial encounter: Secondary | ICD-10-CM | POA: Diagnosis present

## 2020-09-26 DIAGNOSIS — E1151 Type 2 diabetes mellitus with diabetic peripheral angiopathy without gangrene: Secondary | ICD-10-CM | POA: Diagnosis present

## 2020-09-26 DIAGNOSIS — L0390 Cellulitis, unspecified: Secondary | ICD-10-CM

## 2020-09-26 DIAGNOSIS — I2510 Atherosclerotic heart disease of native coronary artery without angina pectoris: Secondary | ICD-10-CM

## 2020-09-26 DIAGNOSIS — Z794 Long term (current) use of insulin: Secondary | ICD-10-CM

## 2020-09-26 DIAGNOSIS — Z955 Presence of coronary angioplasty implant and graft: Secondary | ICD-10-CM

## 2020-09-26 DIAGNOSIS — Z7901 Long term (current) use of anticoagulants: Secondary | ICD-10-CM

## 2020-09-26 DIAGNOSIS — E11621 Type 2 diabetes mellitus with foot ulcer: Secondary | ICD-10-CM | POA: Diagnosis present

## 2020-09-26 DIAGNOSIS — S9032XA Contusion of left foot, initial encounter: Secondary | ICD-10-CM | POA: Diagnosis present

## 2020-09-26 DIAGNOSIS — G4730 Sleep apnea, unspecified: Secondary | ICD-10-CM | POA: Diagnosis present

## 2020-09-26 DIAGNOSIS — I872 Venous insufficiency (chronic) (peripheral): Secondary | ICD-10-CM | POA: Diagnosis present

## 2020-09-26 DIAGNOSIS — E11628 Type 2 diabetes mellitus with other skin complications: Secondary | ICD-10-CM | POA: Diagnosis not present

## 2020-09-26 DIAGNOSIS — Z87442 Personal history of urinary calculi: Secondary | ICD-10-CM

## 2020-09-26 DIAGNOSIS — D631 Anemia in chronic kidney disease: Secondary | ICD-10-CM | POA: Diagnosis present

## 2020-09-26 DIAGNOSIS — N1832 Chronic kidney disease, stage 3b: Secondary | ICD-10-CM

## 2020-09-26 DIAGNOSIS — I130 Hypertensive heart and chronic kidney disease with heart failure and stage 1 through stage 4 chronic kidney disease, or unspecified chronic kidney disease: Secondary | ICD-10-CM | POA: Diagnosis present

## 2020-09-26 DIAGNOSIS — E785 Hyperlipidemia, unspecified: Secondary | ICD-10-CM | POA: Diagnosis present

## 2020-09-26 DIAGNOSIS — I70249 Atherosclerosis of native arteries of left leg with ulceration of unspecified site: Secondary | ICD-10-CM | POA: Diagnosis present

## 2020-09-26 DIAGNOSIS — I255 Ischemic cardiomyopathy: Secondary | ICD-10-CM | POA: Diagnosis present

## 2020-09-26 DIAGNOSIS — Z79890 Hormone replacement therapy (postmenopausal): Secondary | ICD-10-CM

## 2020-09-26 DIAGNOSIS — K5900 Constipation, unspecified: Secondary | ICD-10-CM | POA: Diagnosis not present

## 2020-09-26 DIAGNOSIS — I70245 Atherosclerosis of native arteries of left leg with ulceration of other part of foot: Secondary | ICD-10-CM | POA: Diagnosis not present

## 2020-09-26 DIAGNOSIS — L03116 Cellulitis of left lower limb: Secondary | ICD-10-CM | POA: Diagnosis present

## 2020-09-26 DIAGNOSIS — E872 Acidosis: Secondary | ICD-10-CM | POA: Diagnosis present

## 2020-09-26 DIAGNOSIS — Z79899 Other long term (current) drug therapy: Secondary | ICD-10-CM

## 2020-09-26 DIAGNOSIS — Z6833 Body mass index (BMI) 33.0-33.9, adult: Secondary | ICD-10-CM

## 2020-09-26 DIAGNOSIS — Z791 Long term (current) use of non-steroidal anti-inflammatories (NSAID): Secondary | ICD-10-CM

## 2020-09-26 DIAGNOSIS — Z8249 Family history of ischemic heart disease and other diseases of the circulatory system: Secondary | ICD-10-CM

## 2020-09-26 DIAGNOSIS — Z91018 Allergy to other foods: Secondary | ICD-10-CM

## 2020-09-26 DIAGNOSIS — I878 Other specified disorders of veins: Secondary | ICD-10-CM | POA: Diagnosis present

## 2020-09-26 DIAGNOSIS — Z905 Acquired absence of kidney: Secondary | ICD-10-CM

## 2020-09-26 DIAGNOSIS — E119 Type 2 diabetes mellitus without complications: Secondary | ICD-10-CM | POA: Diagnosis not present

## 2020-09-26 LAB — COMPREHENSIVE METABOLIC PANEL
ALT: 20 U/L (ref 0–44)
AST: 23 U/L (ref 15–41)
Albumin: 3.9 g/dL (ref 3.5–5.0)
Alkaline Phosphatase: 86 U/L (ref 38–126)
Anion gap: 14 (ref 5–15)
BUN: 38 mg/dL — ABNORMAL HIGH (ref 8–23)
CO2: 27 mmol/L (ref 22–32)
Calcium: 9.1 mg/dL (ref 8.9–10.3)
Chloride: 94 mmol/L — ABNORMAL LOW (ref 98–111)
Creatinine, Ser: 1.97 mg/dL — ABNORMAL HIGH (ref 0.61–1.24)
GFR, Estimated: 36 mL/min — ABNORMAL LOW (ref >60)
Glucose, Bld: 171 mg/dL — ABNORMAL HIGH (ref 70–99)
Potassium: 3.9 mmol/L (ref 3.5–5.1)
Sodium: 135 mmol/L (ref 135–145)
Total Bilirubin: 1 mg/dL (ref 0.3–1.2)
Total Protein: 7.8 g/dL (ref 6.5–8.1)

## 2020-09-26 LAB — RESP PANEL BY RT-PCR (FLU A&B, COVID) ARPGX2
Influenza A by PCR: NEGATIVE
Influenza B by PCR: NEGATIVE
SARS Coronavirus 2 by RT PCR: NEGATIVE

## 2020-09-26 LAB — CBC WITH DIFFERENTIAL/PLATELET
Abs Immature Granulocytes: 0.02 10*3/uL (ref 0.00–0.07)
Basophils Absolute: 0 10*3/uL (ref 0.0–0.1)
Basophils Relative: 0 %
Eosinophils Absolute: 0.2 10*3/uL (ref 0.0–0.5)
Eosinophils Relative: 2 %
HCT: 36.6 % — ABNORMAL LOW (ref 39.0–52.0)
Hemoglobin: 12 g/dL — ABNORMAL LOW (ref 13.0–17.0)
Immature Granulocytes: 0 %
Lymphocytes Relative: 18 %
Lymphs Abs: 1.2 10*3/uL (ref 0.7–4.0)
MCH: 31.7 pg (ref 26.0–34.0)
MCHC: 32.8 g/dL (ref 30.0–36.0)
MCV: 96.6 fL (ref 80.0–100.0)
Monocytes Absolute: 1 10*3/uL (ref 0.1–1.0)
Monocytes Relative: 14 %
Neutro Abs: 4.6 10*3/uL (ref 1.7–7.7)
Neutrophils Relative %: 66 %
Platelets: 147 10*3/uL — ABNORMAL LOW (ref 150–400)
RBC: 3.79 MIL/uL — ABNORMAL LOW (ref 4.22–5.81)
RDW: 16.9 % — ABNORMAL HIGH (ref 11.5–15.5)
WBC: 7 10*3/uL (ref 4.0–10.5)
nRBC: 0 % (ref 0.0–0.2)

## 2020-09-26 LAB — URINALYSIS, COMPLETE (UACMP) WITH MICROSCOPIC
Bilirubin Urine: NEGATIVE
Glucose, UA: >=500 mg/dL — AB
Hgb urine dipstick: NEGATIVE
Ketones, ur: NEGATIVE mg/dL
Nitrite: NEGATIVE
Protein, ur: NEGATIVE mg/dL
Specific Gravity, Urine: 1.006 (ref 1.005–1.030)
pH: 6 (ref 5.0–8.0)

## 2020-09-26 LAB — LACTIC ACID, PLASMA
Lactic Acid, Venous: 2.2 mmol/L (ref 0.5–1.9)
Lactic Acid, Venous: 2.7 mmol/L (ref 0.5–1.9)

## 2020-09-26 IMAGING — CR DG FOOT COMPLETE 3+V*L*
1 series · 3 of 3 positions shown · non-contrast
Comparison: Ankle radiograph [DATE]

CLINICAL DATA: Foot swelling concern for infection

EXAM:
LEFT FOOT - COMPLETE 3+ VIEW

[Series 1: dg foot complete left · 0.14mm/px · 3 of 3 slices shown]
[im 1/3]
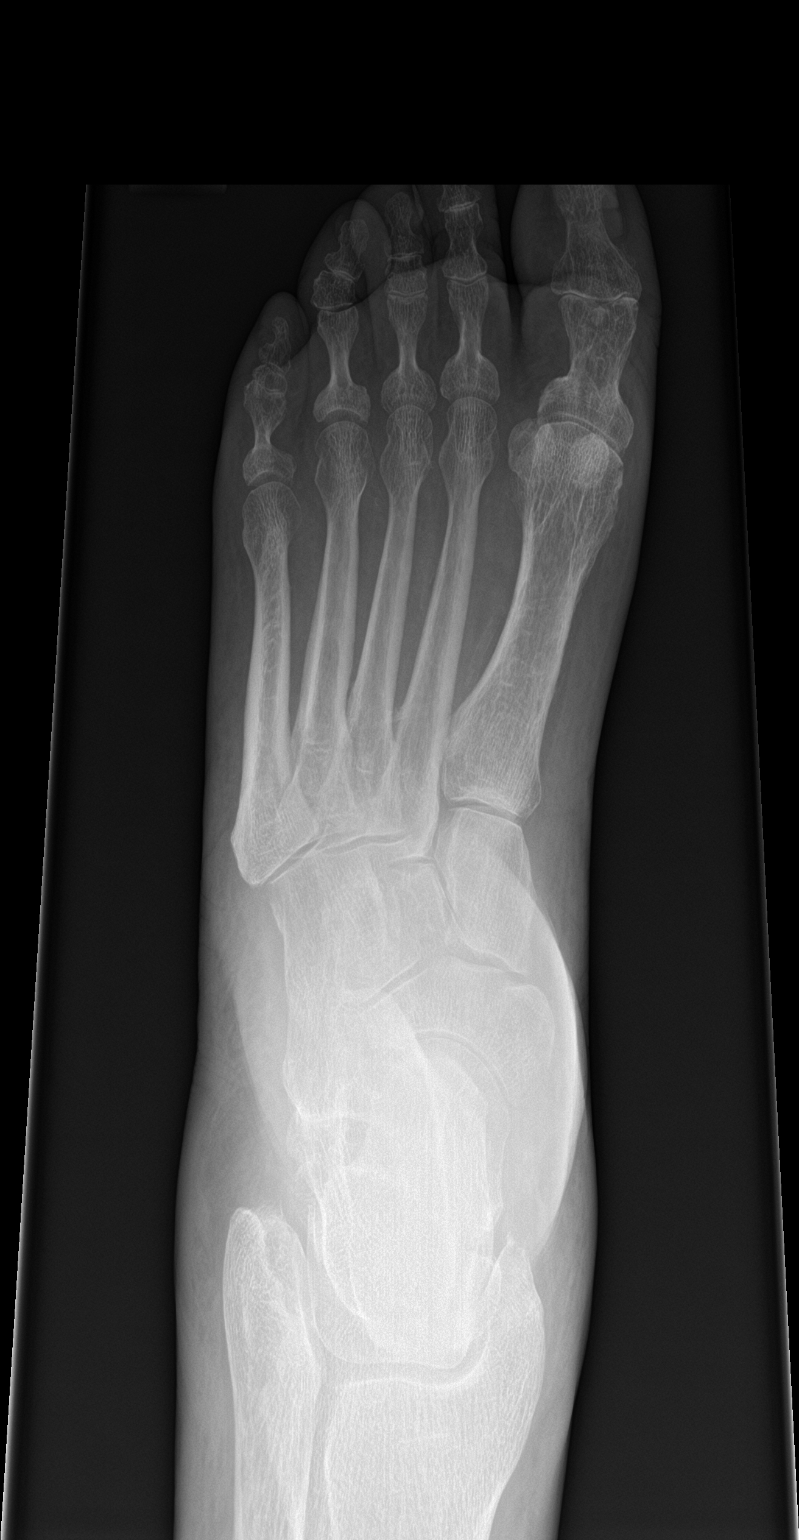
[im 2/3]
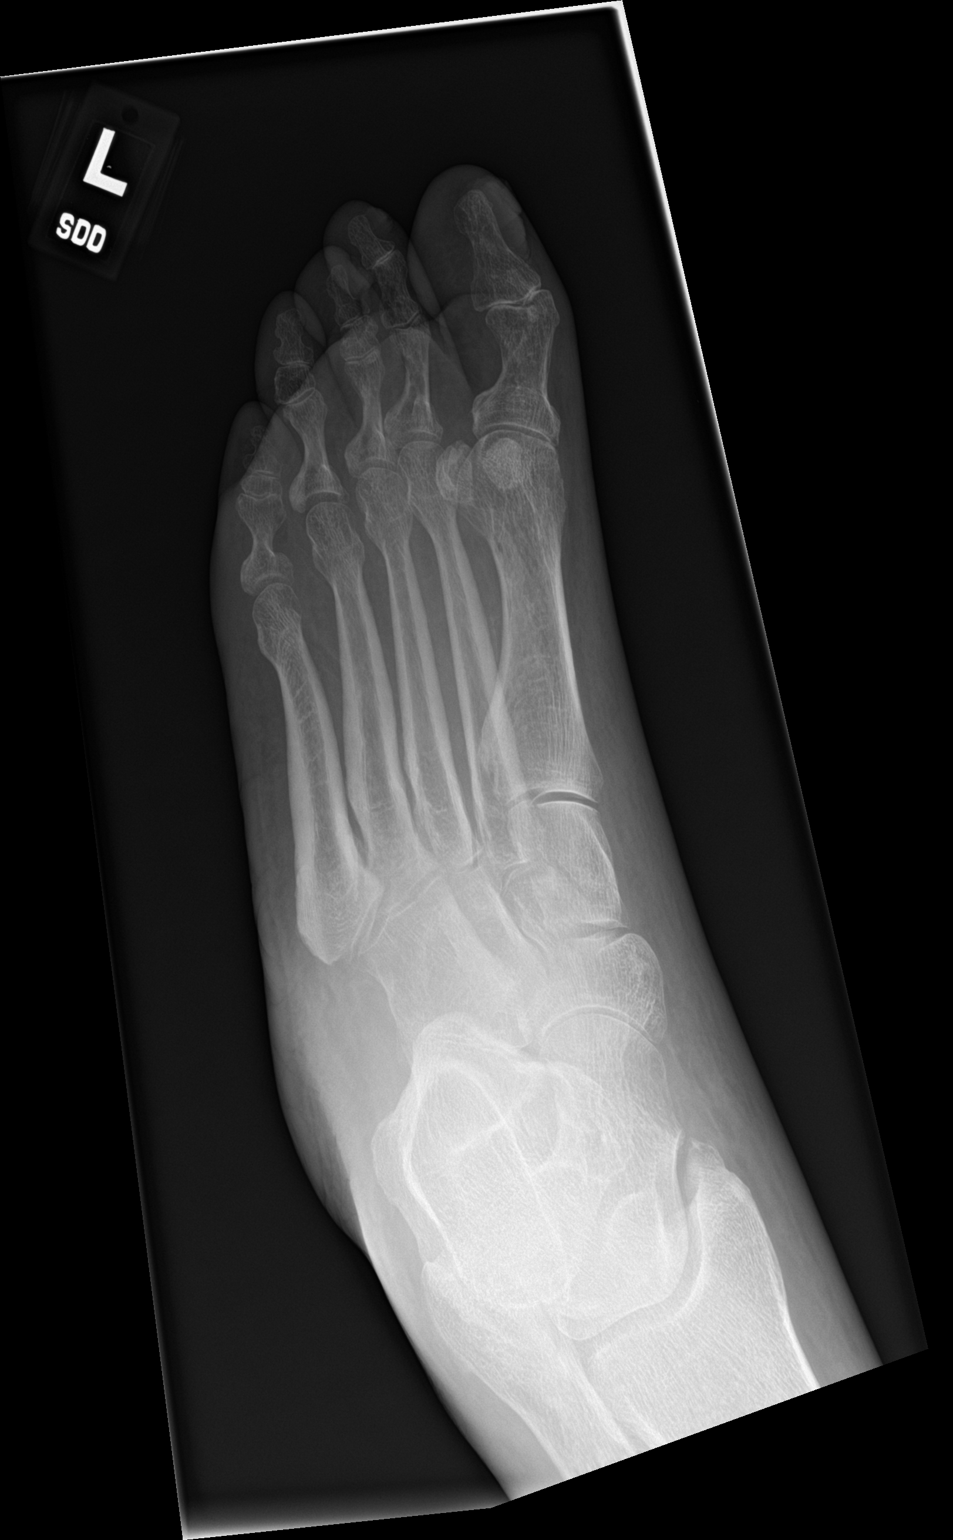
[im 3/3]
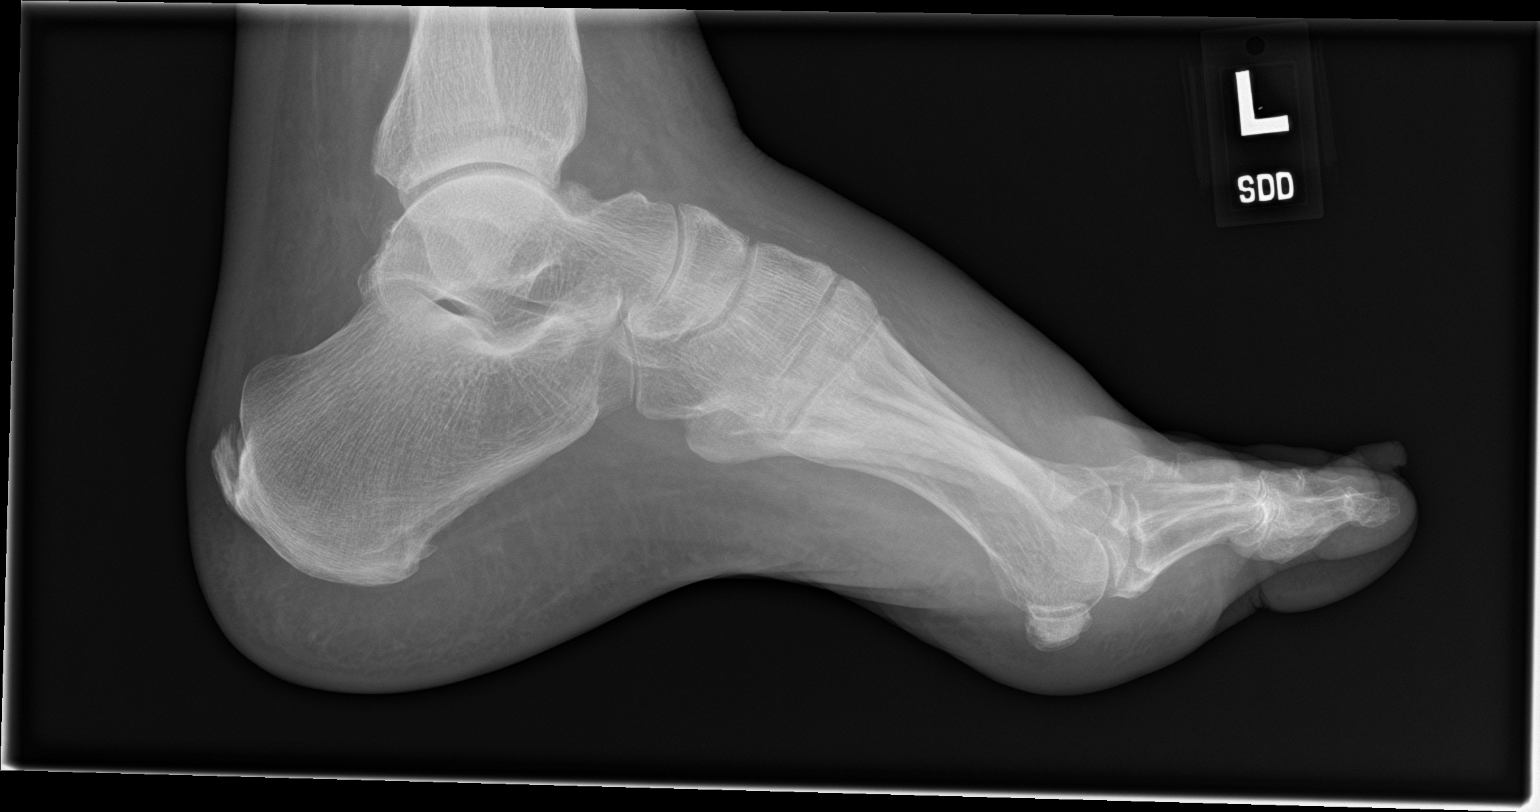

[3 of 3 positions shown; findings below may reference images not displayed]

FINDINGS: There is no evidence of fracture or dislocation. Calcaneal
enthesophytes. Diffuse soft tissue swelling.
IMPRESSION: Diffuse soft tissue swelling.  No acute osseous abnormality.

## 2020-09-26 MED ORDER — VANCOMYCIN HCL IN DEXTROSE 1-5 GM/200ML-% IV SOLN: 1000.0000 mg | Freq: Once | INTRAVENOUS | Status: DC

## 2020-09-26 MED ORDER — SODIUM CHLORIDE 0.9 % IV SOLN: 2.0000 g | Freq: Once | INTRAVENOUS | Status: DC

## 2020-09-26 MED ORDER — MELATONIN-THEANINE 10-5.5 MG PO TABS: ORAL_TABLET | Freq: Every day | ORAL | Status: DC

## 2020-09-26 MED ORDER — TRAZODONE HCL 50 MG PO TABS: 25.0000 mg | ORAL_TABLET | Freq: Every evening | ORAL | Status: DC | PRN

## 2020-09-26 MED ORDER — APIXABAN 5 MG PO TABS
5.0000 mg | ORAL_TABLET | Freq: Two times a day (BID) | ORAL | Status: AC
  Administered 2020-09-27 – 2020-10-02 (×12): 5 mg via ORAL
  Filled 2020-09-26 (×12): qty 1

## 2020-09-26 MED ORDER — METOLAZONE 2.5 MG PO TABS
2.5000 mg | ORAL_TABLET | Freq: Every morning | ORAL | Status: DC
  Filled 2020-09-26: qty 1

## 2020-09-26 MED ORDER — EMPAGLIFLOZIN 10 MG PO TABS
10.0000 mg | ORAL_TABLET | Freq: Every evening | ORAL | Status: DC
  Administered 2020-09-27 – 2020-10-03 (×8): 10 mg via ORAL
  Filled 2020-09-26 (×10): qty 1

## 2020-09-26 MED ORDER — VANCOMYCIN HCL 1500 MG/300ML IV SOLN
1500.0000 mg | Freq: Once | INTRAVENOUS | Status: AC
  Administered 2020-09-26: 1500 mg via INTRAVENOUS
  Filled 2020-09-26: qty 300

## 2020-09-26 MED ORDER — SODIUM CHLORIDE 0.9 % IV SOLN: INTRAVENOUS | Status: DC

## 2020-09-26 MED ORDER — PSYLLIUM 95 % PO PACK
1.0000 | PACK | Freq: Every day | ORAL | Status: DC
  Administered 2020-09-27 – 2020-10-03 (×6): 1 via ORAL
  Filled 2020-09-26 (×8): qty 1

## 2020-09-26 MED ORDER — MAGNESIUM HYDROXIDE 400 MG/5ML PO SUSP
30.0000 mL | Freq: Every day | ORAL | Status: DC | PRN
  Filled 2020-09-26: qty 30

## 2020-09-26 MED ORDER — BROMELAIN 250 MG PO CAPS: ORAL_CAPSULE | Freq: Every day | ORAL | Status: DC

## 2020-09-26 MED ORDER — MAGNESIUM OXIDE -MG SUPPLEMENT 400 (240 MG) MG PO TABS
400.0000 mg | ORAL_TABLET | Freq: Every day | ORAL | Status: DC
  Administered 2020-09-27 – 2020-10-03 (×6): 400 mg via ORAL
  Filled 2020-09-26 (×6): qty 1

## 2020-09-26 MED ORDER — SODIUM CHLORIDE 0.9 % IV SOLN
2.0000 g | Freq: Two times a day (BID) | INTRAVENOUS | Status: DC
  Administered 2020-09-26 – 2020-10-02 (×12): 2 g via INTRAVENOUS
  Filled 2020-09-26 (×13): qty 2

## 2020-09-26 MED ORDER — CARVEDILOL 6.25 MG PO TABS
6.2500 mg | ORAL_TABLET | Freq: Two times a day (BID) | ORAL | Status: DC
  Administered 2020-09-27 – 2020-10-04 (×13): 6.25 mg via ORAL
  Filled 2020-09-26 (×13): qty 1

## 2020-09-26 MED ORDER — VANCOMYCIN HCL IN DEXTROSE 1-5 GM/200ML-% IV SOLN
1000.0000 mg | INTRAVENOUS | Status: DC
  Administered 2020-09-27 – 2020-09-29 (×3): 1000 mg via INTRAVENOUS
  Filled 2020-09-26 (×4): qty 200

## 2020-09-26 MED ORDER — POTASSIUM CHLORIDE CRYS ER 20 MEQ PO TBCR
40.0000 meq | EXTENDED_RELEASE_TABLET | Freq: Every day | ORAL | Status: DC
  Administered 2020-09-27 – 2020-10-04 (×7): 40 meq via ORAL
  Filled 2020-09-26 (×8): qty 2

## 2020-09-26 MED ORDER — INSULIN GLARGINE 100 UNIT/ML ~~LOC~~ SOLN
28.0000 [IU] | Freq: Every day | SUBCUTANEOUS | Status: DC
  Filled 2020-09-26 (×2): qty 0.28

## 2020-09-26 MED ORDER — GLIPIZIDE 10 MG PO TABS
10.0000 mg | ORAL_TABLET | Freq: Two times a day (BID) | ORAL | Status: DC
  Administered 2020-09-27 – 2020-10-03 (×12): 10 mg via ORAL
  Filled 2020-09-26 (×15): qty 1

## 2020-09-26 MED ORDER — CINNAMON 500 MG PO CAPS: 1200.0000 mg | ORAL_CAPSULE | Freq: Every day | ORAL | Status: DC

## 2020-09-26 MED ORDER — AMIODARONE HCL 200 MG PO TABS
200.0000 mg | ORAL_TABLET | Freq: Every day | ORAL | Status: DC
  Administered 2020-09-27 – 2020-10-04 (×7): 200 mg via ORAL
  Filled 2020-09-26 (×7): qty 1

## 2020-09-26 MED ORDER — DOCUSATE SODIUM 100 MG PO CAPS
100.0000 mg | ORAL_CAPSULE | Freq: Every morning | ORAL | Status: DC
  Administered 2020-09-28: 100 mg via ORAL
  Filled 2020-09-26: qty 1

## 2020-09-26 MED ORDER — ACETAMINOPHEN 325 MG PO TABS
650.0000 mg | ORAL_TABLET | Freq: Four times a day (QID) | ORAL | Status: DC | PRN
  Administered 2020-09-28 – 2020-10-03 (×2): 650 mg via ORAL
  Filled 2020-09-26 (×2): qty 2

## 2020-09-26 MED ORDER — ONDANSETRON HCL 4 MG PO TABS: 4.0000 mg | ORAL_TABLET | Freq: Four times a day (QID) | ORAL | Status: DC | PRN

## 2020-09-26 MED ORDER — ACETAMINOPHEN 650 MG RE SUPP: 650.0000 mg | Freq: Four times a day (QID) | RECTAL | Status: DC | PRN

## 2020-09-26 MED ORDER — COQ10 400 MG PO CAPS: 400.0000 mg | ORAL_CAPSULE | Freq: Two times a day (BID) | ORAL | Status: DC

## 2020-09-26 MED ORDER — TORSEMIDE 20 MG PO TABS
40.0000 mg | ORAL_TABLET | Freq: Every day | ORAL | Status: DC
  Administered 2020-09-27 – 2020-10-03 (×6): 40 mg via ORAL
  Filled 2020-09-26 (×8): qty 2

## 2020-09-26 MED ORDER — LEVOTHYROXINE SODIUM 50 MCG PO TABS
50.0000 ug | ORAL_TABLET | Freq: Every day | ORAL | Status: DC
  Administered 2020-09-27 – 2020-10-04 (×8): 50 ug via ORAL
  Filled 2020-09-26 (×9): qty 1

## 2020-09-26 MED ORDER — ONDANSETRON HCL 4 MG/2ML IJ SOLN
4.0000 mg | Freq: Four times a day (QID) | INTRAMUSCULAR | Status: DC | PRN
  Administered 2020-09-30: 4 mg via INTRAVENOUS

## 2020-09-26 MED ORDER — ROSUVASTATIN CALCIUM 10 MG PO TABS
20.0000 mg | ORAL_TABLET | Freq: Every evening | ORAL | Status: DC
  Administered 2020-09-27 – 2020-10-03 (×8): 20 mg via ORAL
  Filled 2020-09-26: qty 1
  Filled 2020-09-26 (×3): qty 2
  Filled 2020-09-26: qty 1
  Filled 2020-09-26 (×3): qty 2

## 2020-09-26 MED ORDER — VANCOMYCIN HCL IN DEXTROSE 1-5 GM/200ML-% IV SOLN
1000.0000 mg | Freq: Once | INTRAVENOUS | Status: AC
  Administered 2020-09-26: 1000 mg via INTRAVENOUS
  Filled 2020-09-26: qty 200

## 2020-09-26 MED ORDER — MORPHINE SULFATE (PF) 2 MG/ML IV SOLN
2.0000 mg | INTRAVENOUS | Status: DC | PRN
  Administered 2020-09-30 – 2020-10-02 (×6): 2 mg via INTRAVENOUS
  Filled 2020-09-26 (×6): qty 1

## 2020-09-26 MED ORDER — LOSARTAN POTASSIUM 25 MG PO TABS
12.5000 mg | ORAL_TABLET | Freq: Every day | ORAL | Status: DC
  Administered 2020-09-27 – 2020-10-04 (×7): 12.5 mg via ORAL
  Filled 2020-09-26 (×4): qty 1
  Filled 2020-09-26: qty 0.5
  Filled 2020-09-26 (×2): qty 1

## 2020-09-26 MED ORDER — CLONAZEPAM 1 MG PO TABS
1.0000 mg | ORAL_TABLET | Freq: Every day | ORAL | Status: DC
  Administered 2020-09-27 – 2020-10-03 (×7): 1 mg via ORAL
  Filled 2020-09-26 (×7): qty 1

## 2020-09-26 NOTE — Consult Note (Signed)
Pharmacy Antibiotic Note  Jerrie Gullo is a 70 y.o. male admitted on 09/26/2020 with left foot swelling, redness and some discomfort. Xray negative for osseus abnormality.  Pharmacy has been consulted for vancomycin and cefepime dosing for cellulitis  Plan:  Cefepime 2 gram Q12H  Vancomycin 2500 mg IV LD   Vancomycin 1000 mg Q24H. Goal AUC 400-550  Estimated AUC: 475  Scr used: 1.97  Follow up for antibiotic de-escalation and transition to oral therapy  Follow renal function daily for dose adjustments  Height: 6' (182.9 cm) Weight: 113.4 kg (250 lb) IBW/kg (Calculated) : 77.6  Temp (24hrs), Avg:97.7 F (36.5 C), Min:97.7 F (36.5 C), Max:97.7 F (36.5 C)  Recent Labs  Lab 09/26/20 1717 09/26/20 1927  WBC 7.0  --   CREATININE 1.97*  --   LATICACIDVEN 2.2* 2.7*    Estimated Creatinine Clearance: 46.6 mL/min (A) (by C-G formula based on SCr of 1.97 mg/dL (H)).    Allergies  Allergen Reactions  . Other Hives and Other (See Comments)    Blue cheese Blue cheese Blue cheese    Antimicrobials this admission: 4/25 cefepime >>  4/25 vancomcycin >>   Dose adjustments this admission: n/a  Microbiology results: 4/25 BCx: sent   Thank you for allowing pharmacy to be a part of this patient's care.  Dorothe Pea, PharmD, BCPS  09/26/2020 8:35 PM

## 2020-09-26 NOTE — ED Notes (Signed)
Pt to xray

## 2020-09-26 NOTE — H&P (Addendum)
[place]   PATIENT NAME: [name]    MR#:  [id number]  DATE OF BIRTH:  Oct 17, 1950  DATE OF ADMISSION:  09/26/2020  PRIMARY CARE PHYSICIAN: Vidal Schwalbe, MD   Patient is coming from: Home  REQUESTING/REFERRING PHYSICIAN: Nance Pear, MD  CHIEF COMPLAINT:   Chief Complaint  Patient presents with  . Foot Pain    HISTORY OF PRESENT ILLNESS:  [name] is a 70 y.o. Caucasian male with medical history significant for coronary artery disease, status post PCI and stents, type 2 diabetes mellitus, stage IIIb chronic kidney disease, hypertension, ischemic cardiomyopathy, paroxysmal atrial fibrillation on Eliquis and sleep apnea, who presented to the emergency room with acute onset of worsening left foot swelling with associated pain and discoloration, warmth and tenderness after walking from a while shopping on Saturday.  He noticed significant redness in his left fourth digit and he believes he cut his nails too short.  His blood glucose highest measured was 257 over the last few days that is high for him.  No fever or chills.  No nausea or vomiting or abdominal pain.  No chest pain or dyspnea or cough or wheezing.  ED Course: Upon presentation to the emergency room, vital signs were within normal.  Labs revealed BMP with BUN of 38 and creatinine 1.97 compared to 87 and 2.03 on 09/16/2020.  Lactic acid was 2.2.  CBC showed mild anemia compared to 07/17/2020.  UA showed more than 500 glucose and was otherwise unremarkable.  Imaging: Left foot x-ray showed diffuse soft tissue swelling with no acute osseous abnormality.  The patient was given IV vancomycin.  He will be admitted to a medical bed for further evaluation and management.   PAST MEDICAL HISTORY:   Past Medical History:  Diagnosis Date  . CKD (chronic kidney disease) stage 3, GFR 30-59 ml/min (HCC)   . Coronary artery disease    a. 1998 s/p ACS Multi-link BMS to the prox LAD ([place], [place]); b. 07/2015  Cath: LM nl, LAD patent stent, LCX min irregs, OM1/2 min irregs, OM3 nl, RCA min irregs; c. 07/2020 Cath: LM nl, LAD 50p/m, 70d, D2 70, LCX 40p/63m, OM3 40, RCA 60d->Med Rx.  . Diabetes mellitus type 2, uncontrolled ([place])    a. A1c 11.0 in 07/2015.  [name] HFrEF    a. EF 25% by cath in 2013; b. Echo in 07/2015 showing EF of 15-20%, moderate MR, moderate Pulm HTN, severely dilated IVC; c. 11/2016 Echo: EF 55-60%, no rwma, Gr1 DD, mildly dil LA, nl RV fxn. d. 12/2019 Echo: EF 40-45%, e. 07/2020 Echo: EF 20-25%, sev red RV fxn, mod dil LA, mild-mod MR.  [name] History of kidney stones   . Hypertension   . Ischemic cardiomyopathy   . Kidney stones    Left  . Paroxysmal atrial fibrillation ([place])    a. Dx 07/2015; b. CHA2DS2VASc = 5-->eliquis. Rhythm control w/ amiodarone.  . Pollen allergies 09-21-15   pt called and stated that he woke up and had some drainage-pt states he did not see the color of the drainage-pt denies running a fever and this only happened once-pt instructed to call Dr Audree Bane office if he starts running a fever or if the color of drainage becomes yellow/green  . Psoriasis   . Pulmonary hypertension ([place])   . Renal disorder    kidney stone  . Sleep apnea     PAST SURGICAL HISTORY:   Past Surgical History:  Procedure Laterality  Date  . CARDIAC CATHETERIZATION N/A 07/25/2015   Procedure: Right/Left Heart Cath and Coronary Angiography;  Surgeon: Wellington Hampshire, MD;  Location: [facility];  Service: Cardiovascular;  Laterality: N/A;  . CARDIAC CATHE[place]IZATION     [place]  . CARDIAC CATHETERIZATION     [facility], [place]    . CYSTOSCOPY WITH STENT PLACEMENT Left 09/07/2015   Procedure: CYSTOSCOPY WITH STENT PLACEMENT;  Surgeon: Hollice Espy, MD;  Location: [facility];  Service: Urology;  Laterality: Left;  . CYSTOSCOPY/URETEROSCOPY/HOLMIUM LASER/STENT PLACEMENT Left 10/25/2015   Procedure: CYSTOSCOPY/URETEROSCOPY/HOLMIUM  LASER/STENT EXCHANGE;  Surgeon: Hollice Espy, MD;  Location: [facility];  Service: Urology;  Laterality: Left;  . CYSTOSCOPY/URETEROSCOPY/HOLMIUM LASER/STENT PLACEMENT Left 05/11/2019   Procedure: CYSTOSCOPY/URETEROSCOPY/HOLMIUM LASER/STENT PLACEMENT;  Surgeon: Hollice Espy, MD;  Location: [facility];  Service: Urology;  Laterality: Left;  . EXTRACORPOREAL SHOCK WAVE LITHOTRIPSY Left 04/02/2019   Procedure: EXTRACORPOREAL SHOCK WAVE LITHOTRIPSY (ESWL);  Surgeon: Hollice Espy, MD;  Location: [facility];  Service: Urology;  Laterality: Left;  . EXTRACORPOREAL SHOCK WAVE LITHOTRIPSY Left 03/05/2019   Procedure: EXTRACORPOREAL SHOCK WAVE LITHOTRIPSY (ESWL);  Surgeon: Abbie Sons, MD;  Location: [facility];  Service: Urology;  Laterality: Left;  . EYE SURGERY Bilateral    LASER FOR GLAUCOMA  . KIDNEY SURGERY    . NEPHROSTOMY TUBE PLACEMENT ([place] [facility]) Left 11/2019  . RIGHT/LEFT HEART CATH AND CORONARY ANGIOGRAPHY N/A 07/12/2020   Procedure: RIGHT/LEFT HEART CATH AND CORONARY ANGIOGRAPHY;  Surgeon: Nelva Bush, MD;  Location: [facility];  Service: Cardiovascular;  Laterality: N/A;  . ROBOT ASSISTED LAPAROSCOPIC NEPHRECTOMY Left 12/10/2019   Procedure: XI ROBOTIC ASSISTED LAPAROSCOPIC NEPHRECTOMY;  Surgeon: Hollice Espy, MD;  Location: [facility];  Service: Urology;  Laterality: Left;  . URETEROSCOPY WITH HOLMIUM LASER LITHOTRIPSY Left 09/07/2015   Procedure: URETEROSCOPY WITH HOLMIUM LASER LITHOTRIPSY;  Surgeon: Hollice Espy, MD;  Location: [facility];  Service: Urology;  Laterality: Left;    SOCIAL HISTORY:   Social History   Tobacco Use  . Smoking status: Never Smoker  . Smokeless tobacco: Never Used  Substance Use Topics  . Alcohol use: Yes    Alcohol/week: 0.0 standard drinks    Comment: occasionally    FAMILY HISTORY:   Family History  Problem Relation Age of Onset  . Heart failure Father   . Heart attack Brother   . Kidney cancer Neg Hx   . Bladder Cancer Neg Hx   .  Prostate cancer Neg Hx     DRUG ALLERGIES:   Allergies  Allergen Reactions  . Other Hives and Other (See Comments)    Blue cheese Blue cheese Blue cheese    REVIEW OF SYSTEMS:   ROS As per history of present illness. All pertinent systems were reviewed above. Constitutional, HEENT, cardiovascular, respiratory, GI, GU, musculoskeletal, neuro, psychiatric, endocrine, integumentary and hematologic systems were reviewed and are otherwise negative/unremarkable except for positive findings mentioned above in the HPI.   MEDICATIONS AT HOME:   Prior to Admission medications   Medication Sig Start Date End Date Taking? Authorizing Provider  amiodarone (PACERONE) 200 MG tablet Take 1 tablet (200 mg total) by mouth daily. 08/01/15   Hower, Aaron Mose, MD  apixaban (ELIQUIS) 5 MG TABS tablet Take 1 tablet (5 mg total) 2 (two) times daily by mouth. 04/17/17   End, Harrell Gave, MD  Bromelains (BROMELAIN PO) Take 2 capsules by mouth daily.    [provider]  carvedilol (COREG) 6.25  MG tablet Take 1 tablet (6.25 mg total) by mouth 2 (two) times daily with a meal. 07/19/20   Furth, Cadence H, PA-C  CINNAMON PO Take 1,200 mg by mouth in the morning and at bedtime. [place] Cinnamon 1200mg     [provider]  clonazePAM (KLONOPIN) 1 MG tablet Take 1 mg by mouth See admin instructions. Take 1 tablet (1 mg) by mouth scheduled at bedtime, may take an additional dose during the day if needed for anxiety 01/23/17   [provider]  Coenzyme Q10 (COQ10) 400 MG CAPS Take 400 mg by mouth 2 (two) times daily.    [provider]  docusate sodium (COLACE) 100 MG capsule Take 100 mg in the am & 200 mg in the pm.    [provider]  Dulaglutide 1.5 MG/0.5ML SOPN Inject into the skin once a week. 08/17/20   [provider]  glipiZIDE (GLUCOTROL) 10 MG tablet Take 10 mg by mouth 2 (two) times daily.     [provider]  HAWTHORNE BERRY PO Take 565 mg by mouth at  bedtime.    [provider]  insulin glargine, 2 Unit Dial, (TOUJEO MAX) 300 UNIT/ML Solostar Pen Inject 28 Units into the skin daily. Hold for blood sugar <70. Titrate up to 60 units/day, per endocrinology instructions. 05/05/18   [provider]  JARDIANCE 10 MG TABS tablet Take 10 mg by mouth every evening.  04/28/18   [provider]  levothyroxine (SYNTHROID) 50 MCG tablet Take by mouth daily at 12 noon. 08/29/20 08/29/21  [provider]  losartan (COZAAR) 25 MG tablet Take 0.5 tablets (12.5 mg total) by mouth daily. 07/19/20   Furth, Cadence H, PA-C  magnesium oxide (MAG-OX) 400 (241.3 Mg) MG tablet Take 1 tablet (400 mg total) by mouth daily. 07/20/20   Furth, Cadence H, PA-C  MELATONIN-THEANINE PO Take 2 capsules by mouth at bedtime. [place] Sleep Gummies    [provider]  metolazone (ZAROXOLYN) 2.5 MG tablet Take 1 tablet (2.5 mg total) by mouth every morning. Along with your morning dose of Torsemide. 08/26/20 11/24/20  End, Harrell Gave, MD  Multiple Vitamins-Minerals (PRESERVISION AREDS 2 PO) Take 1 tablet by mouth in the morning and at bedtime.    [provider]  potassium chloride SA (KLOR-CON) 20 MEQ tablet Take 2 tablets (40 mEq total) by mouth daily. 07/19/20   Furth, Cadence H, PA-C  psyllium (REGULOID) 0.52 g capsule Take 2.6 g by mouth daily.    [provider]  rosuvastatin (CRESTOR) 20 MG tablet Take 20 mg by mouth every evening.  01/27/19   [provider]  Torsemide 40 MG TABS Take 40 mg by mouth daily. 09/16/20   End, Harrell Gave, MD  valACYclovir (VALTREX) 1000 MG tablet Take 1,000 mg by mouth daily as needed. 08/01/20   [provider]      VITAL SIGNS:  Blood pressure 106/67, pulse 71, temperature 97.7 F (36.5 C), temperature source Oral, resp. rate 18, height 6' (1.829 m), weight 113.4 kg, SpO2 96 %.  PHYSICAL EXAMINATION:  Physical Exam  GENERAL:  70 y.o.-year-old patient lying in the bed with  no acute distress.  EYES: Pupils equal, round, reactive to light and accommodation. No scleral icterus. Extraocular muscles intact.  HEENT: Head atraumatic, normocephalic. Oropharynx and nasopharynx clear.  NECK:  Supple, no jugular venous distention. No thyroid enlargement, no tenderness.  LUNGS: Normal breath sounds bilaterally, no wheezing, rales,rhonchi or crepitation. No use of accessory muscles of respiration.  CARDIOVASCULAR: Regular rate and rhythm, S1, S2 normal. No murmurs, rubs, or gallops.  ABDOMEN: Soft, nondistended, nontender. Bowel sounds present. No organomegaly or mass.  EXTREMITIES: Bilateral leg posterior mid to the hyperpigmentation with scaling and mild erythema. NEUROLOGIC: Cranial nerves II through XII are intact. Muscle strength 5/5 in all extremities. Sensation intact. Gait not checked.  PSYCHIATRIC: The patient is alert and oriented x 3.  Normal affect and good eye contact. SKIN: Left foot erythema, warmth, tenderness over the dorsum of the foot and more pronounced on the the fourth toe.  He has a circular mid lateral area of possible pus accumulation as shown below, with onychomycotic toenails.[name]    LABORATORY PANEL:   CBC Recent Labs  Lab 09/26/20 1717  WBC 7.0  HGB 12.0*  HCT 36.6*  PLT 147*   ------------------------------------------------------------------------------------------------------------------  Chemistries  Recent Labs  Lab 09/26/20 1717  NA 135  K 3.9  CL 94*  CO2 27  GLUCOSE 171*  BUN 38*  CREATININE 1.97*  CALCIUM 9.1  AST 23  ALT 20  ALKPHOS 86  BILITOT 1.0   ------------------------------------------------------------------------------------------------------------------  Cardiac Enzymes No results for input(s): TROPONINI in the last 168 hours. ------------------------------------------------------------------------------------------------------------------  RADIOLOGY:  DG Foot Complete Left  Result Date:  09/26/2020 CLINICAL DATA:  Foot swelling concern for infection EXAM: LEFT FOOT - COMPLETE 3+ VIEW COMPARISON:  Ankle radiograph February 06, 2019 FINDINGS: There is no evidence of fracture or dislocation. Calcaneal enthesophytes. Diffuse soft tissue swelling. IMPRESSION: Diffuse soft tissue swelling.  No acute osseous abnormality. Electronically Signed   By: Dahlia Bailiff MD   On: 09/26/2020 19:33      IMPRESSION AND PLAN:  Active Problems:   Cellulitis in diabetic foot ([place])  1.  Left foot diabetic severe cellulitis. - The patient will be admitted to a medical bed. - We will continue IV antibiotic therapy with IV vancomycin and cefepime. - Pain management will be provided. -Podiatry consult will be obtained. - I notified Dr. Jacqualyn Posey about the patient.  2.  Type 2 diabetes mellitus with stage IIIb chronic kidney disease. - We will continue basal coverage as well as Jardiance and Glucotrol. - The patient will be placed on supplemental coverage with NovoLog.  3.  Coronary artery disease. - We will continue his Coreg.  4.  Paroxysmal atrial fibrillation. - We will continue amiodarone and Eliquis.  5.  Essential hypertension. - We will continue Coreg and Cozaar.  6.  Dyslipidemia. - We will continue statin therapy.  DVT prophylaxis: Lovenox.   Code Status: full code. Family Communication:  The plan of care was discussed in details with the patient (and family). I answered all questions. The patient agreed to proceed with the above mentioned plan. Further management will depend upon hospital course. Disposition Plan: Back to previous home environment Consults called: Podiatry consult. All the records are reviewed and case discussed with ED provider.  Status is: Inpatient  Remains inpatient appropriate because:Ongoing active pain requiring inpatient pain management, Ongoing diagnostic testing needed not appropriate for outpatient work up, Unsafe d/c plan, IV treatments  appropriate due to intensity of illness or inability to take PO and Inpatient level of care appropriate due to severity of illness   Dispo: The patient is from: Home              Anticipated d/c is to: Home              Patient currently is not medically stable to d/c.   Difficult to  place patient No TOTAL TIME TAKING CARE OF THIS PATIENT: 50 minutes.    Christel Mormon M.D on 09/26/2020 at 8:22 PM  [facility]   From 7 PM-7 AM, contact night-coverage [email or web address]  CC: Primary care physician; Vidal Schwalbe, MD

## 2020-09-26 NOTE — ED Notes (Addendum)
ED Provider at bedside.  Discussion for admission.

## 2020-09-26 NOTE — ED Triage Notes (Signed)
Pt in with left sided foot pain with warmth, swelling, redness and purple discoloration.  Pt states he went shopping on Saturday and then noticed this yesterday. Pt is known diabetic.

## 2020-09-26 NOTE — ED Provider Notes (Signed)
[facility] Emergency Department Provider Note  ____________________________________________   I have reviewed the triage vital signs and the nursing notes.   HISTORY  Chief Complaint Foot Pain   History limited by: Not Limited   HPI [name] is a 70 y.o. male who presents to the emergency department today because of concern for left foot swelling, redness and some discomfort. The patient states that he went shopping 2 days ago. Then started noticing a small amount of discomfort in the fifth digit of the left foot. Then started noticing redness and discoloration of the foot. Yesterday and today the discoloration and discomfort have worsened. The patient denies similar symptoms in the past. Denies any fevers, nausea or vomiting.    Records reviewed. Per medical record review patient has a history of DM, CKD, HTN.   Past Medical History:  Diagnosis Date  . CKD (chronic kidney disease) stage 3, GFR 30-59 ml/min (HCC)   . Coronary artery disease    a. 1998 s/p ACS Multi-link BMS to the prox LAD ([place], [place]); b. 07/2015 Cath: LM nl, LAD patent stent, LCX min irregs, OM1/2 min irregs, OM3 nl, RCA min irregs; c. 07/2020 Cath: LM nl, LAD 50p/m, 70d, D2 70, LCX 40p/21m, OM3 40, RCA 60d->Med Rx.  . Diabetes mellitus type 2, uncontrolled ([place])    a. A1c 11.0 in 07/2015.  [name] HFrEF    a. EF 25% by cath in 2013; b. Echo in 07/2015 showing EF of 15-20%, moderate MR, moderate Pulm HTN, severely dilated IVC; c. 11/2016 Echo: EF 55-60%, no rwma, Gr1 DD, mildly dil LA, nl RV fxn. d. 12/2019 Echo: EF 40-45%, e. 07/2020 Echo: EF 20-25%, sev red RV fxn, mod dil LA, mild-mod MR.  [name] History of kidney stones   . Hypertension   . Ischemic cardiomyopathy   . Kidney stones    Left  . Paroxysmal atrial fibrillation ([place])    a. Dx 07/2015; b. CHA2DS2VASc = 5-->eliquis. Rhythm control w/ amiodarone.  . Pollen allergies 09-21-15   pt called and stated that he woke up and had  some drainage-pt states he did not see the color of the drainage-pt denies running a fever and this only happened once-pt instructed to call Dr Audree Bane office if he starts running a fever or if the color of drainage becomes yellow/green  . Psoriasis   . Pulmonary hypertension ([place])   . Renal disorder    kidney stone  . Sleep apnea     Patient Active Problem List   Diagnosis Date Noted  . Abnormal LFTs 07/19/2020  . Chronic kidney disease, stage IV (severe) ([place]) 07/08/2020  . Left nephrolithiasis 12/10/2019  . Acute pyelonephritis 11/12/2019  . Chronic diastolic CHF (congestive heart failure) ([place]) 05/12/2018  . Chronic heart failure with preserved ejection fraction (HFpEF) ([place]) 12/05/2017  . NICM (nonischemic cardiomyopathy) ([place]) 04/18/2017  . Non-rheumatic mitral regurgitation 04/18/2017  . Chronic kidney disease, stage III (moderate) ([place]) 04/18/2017  . Type 2 diabetes mellitus with diabetic polyneuropathy, without long-term current use of insulin ([place]) 02/27/2017  . Hyperlipidemia due to type 2 diabetes mellitus ([place]) 02/27/2017  . Left ureteral stone 10/25/2015  . Coronary artery disease involving native coronary artery of native heart without angina pectoris 09/22/2015  . Acute on chronic HFrEF (heart failure with reduced ejection fraction) ([place]) 09/22/2015  . First degree AV block 09/22/2015  . Essential hypertension 09/22/2015  . Hyperlipidemia LDL goal <70 09/22/2015  . Hypotension 08/09/2015  . Hydronephrosis with renal  and ureteral calculus obstruction   . Kidney stone on left side   . OSA (obstructive sleep apnea)   . Hyponatremia 07/13/2015  . Uncontrolled type 2 diabetes mellitus ([place]) 07/13/2015  . Pulmonary hypertension ([place])   . Cardiorenal syndrome   . Morbid obesity due to excess calories ([place])   . Paroxysmal atrial fibrillation [facility])     Past Surgical History:  Procedure Laterality Date  . CARDIAC CATHETERIZATION N/A 07/25/2015   Procedure: Right/Left  Heart Cath and Coronary Angiography;  Surgeon: Wellington Hampshire, MD;  Location: [place] [facility];  Service: Cardiovascular;  Laterality: N/A;  . CARDIAC CATHETERIZATION     [place]  . CARDIAC CATHETERIZATION     [facility], [place]    . CYSTOSCOPY WITH STENT PLACEMENT Left 09/07/2015   Procedure: CYSTOSCOPY WITH STENT PLACEMENT;  Surgeon: Hollice Espy, MD;  Location: [facility];  Service: Urology;  Laterality: Left;  . CYSTOSCOPY/URETEROSCOPY/HOLMIUM LASER/STENT PLACEMENT Left 10/25/2015   Procedure: CYSTOSCOPY/URETEROSCOPY/HOLMIUM LASER/STENT EXCHANGE;  Surgeon: Hollice Espy, MD;  Location: [facility];  Service: Urology;  Laterality: Left;  . CYSTOSCOPY/URETEROSCOPY/HOLMIUM LASER/STENT PLACEMENT Left 05/11/2019   Procedure: CYSTOSCOPY/URETEROSCOPY/HOLMIUM LASER/STENT PLACEMENT;  Surgeon: Hollice Espy, MD;  Location: [facility];  Service: Urology;  Laterality: Left;  . EXTRACORPOREAL SHOCK WAVE LITHOTRIPSY Left 04/02/2019   Procedure: EXTRACORPOREAL SHOCK WAVE LITHOTRIPSY (ESWL);  Surgeon: Hollice Espy, MD;  Location: [facility];  Service: Urology;  Laterality: Left;  . EXTRACORPOREAL SHOCK WAVE LITHOTRIPSY Left 03/05/2019   Procedure: EXTRACORPOREAL SHOCK WAVE LITHOTRIPSY (ESWL);  Surgeon: Abbie Sons, MD;  Location: [facility];  Service: Urology;  Laterality: Left;  . EYE SURGERY Bilateral    LASER FOR GLAUCOMA  . KIDNEY SURGERY    . NEPHROSTOMY TUBE PLACEMENT ([place] HX) Left 11/2019  . RIGHT/LEFT HEART CATH AND CORONARY ANGIOGRAPHY N/A 07/12/2020   Procedure: RIGHT/LEFT HEART CATH AND CORONARY ANGIOGRAPHY;  Surgeon: Nelva Bush, MD;  Location: [place] [facility];  Service: Cardiovascular;  Laterality: N/A;  . ROBOT ASSISTED LAPAROSCOPIC NEPHRECTOMY Left 12/10/2019   Procedure: XI ROBOTIC ASSISTED LAPAROSCOPIC NEPHRECTOMY;  Surgeon: Hollice Espy, MD;  Location: [facility];  Service: Urology;  Laterality: Left;  . URETEROSCOPY WITH  HOLMIUM LASER LITHOTRIPSY Left 09/07/2015   Procedure: URETEROSCOPY WITH HOLMIUM LASER LITHOTRIPSY;  Surgeon: Hollice Espy, MD;  Location: [facility];  Service: Urology;  Laterality: Left;    Prior to Admission medications   Medication Sig Start Date End Date Taking? Authorizing Provider  amiodarone (PACERONE) 200 MG tablet Take 1 tablet (200 mg total) by mouth daily. 08/01/15   Hower, Aaron Mose, MD  apixaban (ELIQUIS) 5 MG TABS tablet Take 1 tablet (5 mg total) 2 (two) times daily by mouth. 04/17/17   End, Harrell Gave, MD  Bromelains (BROMELAIN PO) Take 2 capsules by mouth daily.    [provider]  carvedilol (COREG) 6.25 MG tablet Take 1 tablet (6.25 mg total) by mouth 2 (two) times daily with a meal. 07/19/20   Furth, Cadence H, PA-C  CINNAMON PO Take 1,200 mg by mouth in the morning and at bedtime. [place] Cinnamon 1200mg     [provider]  clonazePAM (KLONOPIN) 1 MG tablet Take 1 mg by mouth See admin instructions. Take 1 tablet (1 mg) by mouth scheduled at bedtime, may take an additional dose during the day if needed for anxiety 01/23/17   [provider]  Coenzyme Q10 (COQ10) 400 MG CAPS Take 400 mg by mouth 2 (two) times daily.  [provider]  docusate sodium (COLACE) 100 MG capsule Take 100 mg in the am & 200 mg in the pm.    [provider]  Dulaglutide 1.5 MG/0.5ML SOPN Inject into the skin once a week. 08/17/20   [provider]  glipiZIDE (GLUCOTROL) 10 MG tablet Take 10 mg by mouth 2 (two) times daily.     [provider]  HAWTHORNE BERRY PO Take 565 mg by mouth at bedtime.    [provider]  insulin glargine, 2 Unit Dial, (TOUJEO MAX) 300 UNIT/ML Solostar Pen Inject 28 Units into the skin daily. Hold for blood sugar <70. Titrate up to 60 units/day, per endocrinology instructions. 05/05/18   [provider]  JARDIANCE 10 MG TABS tablet Take 10 mg by mouth every evening.  04/28/18   [provider]   levothyroxine (SYNTHROID) 50 MCG tablet Take by mouth daily at 12 noon. 08/29/20 08/29/21  [provider]  losartan (COZAAR) 25 MG tablet Take 0.5 tablets (12.5 mg total) by mouth daily. 07/19/20   Furth, Cadence H, PA-C  magnesium oxide (MAG-OX) 400 (241.3 Mg) MG tablet Take 1 tablet (400 mg total) by mouth daily. 07/20/20   Furth, Cadence H, PA-C  MELATONIN-THEANINE PO Take 2 capsules by mouth at bedtime. Olly Sleep Gummies    [provider]  metolazone (ZAROXOLYN) 2.5 MG tablet Take 1 tablet (2.5 mg total) by mouth every morning. Along with your morning dose of Torsemide. 08/26/20 11/24/20  End, Harrell Gave, MD  Multiple Vitamins-Minerals (PRESERVISION AREDS 2 PO) Take 1 tablet by mouth in the morning and at bedtime.    [provider]  potassium chloride SA (KLOR-CON) 20 MEQ tablet Take 2 tablets (40 mEq total) by mouth daily. 07/19/20   Furth, Cadence H, PA-C  psyllium (REGULOID) 0.52 g capsule Take 2.6 g by mouth daily.    [provider]  rosuvastatin (CRESTOR) 20 MG tablet Take 20 mg by mouth every evening.  01/27/19   [provider]  Torsemide 40 MG TABS Take 40 mg by mouth daily. 09/16/20   End, Harrell Gave, MD  valACYclovir (VALTREX) 1000 MG tablet Take 1,000 mg by mouth daily as needed. 08/01/20   [provider]    Allergies Other  Family History  Problem Relation Age of Onset  . Heart failure Father   . Heart attack Brother   . Kidney cancer Neg Hx   . Bladder Cancer Neg Hx   . Prostate cancer Neg Hx     Social History Social History   Tobacco Use  . Smoking status: Never Smoker  . Smokeless tobacco: Never Used  Vaping Use  . Vaping Use: Never used  Substance Use Topics  . Alcohol use: Yes    Alcohol/week: 0.0 standard drinks    Comment: occasionally  . Drug use: No    Types: Marijuana, Cocaine, Opium, LSD    Comment: Past, greater than 10 years ago    Review of Systems Constitutional: No fever/chills Eyes: No  visual changes. ENT: No sore throat. Cardiovascular: Denies chest pain. Respiratory: Denies shortness of breath. Gastrointestinal: No abdominal pain.  No nausea, no vomiting.  No diarrhea.   Genitourinary: Negative for dysuria. Musculoskeletal: Positive for left foot swelling and discoloration. Skin: Positive for redness to left foot.  Neurological: Negative for headaches, focal weakness or numbness.  ____________________________________________   PHYSICAL EXAM:  VITAL SIGNS: ED Triage Vitals  Enc Vitals Group     BP 09/26/20 1712 120/71  Pulse Rate 09/26/20 1712 76     Resp 09/26/20 1712 16     Temp 09/26/20 1712 97.7 F (36.5 C)     Temp Source 09/26/20 1712 Oral     SpO2 09/26/20 1712 96 %     Weight 09/26/20 1714 250 lb (113.4 kg)     Height 09/26/20 1714 6' (1.829 m)     Head Circumference --      Peak Flow --      Pain Score 09/26/20 1713 9    Constitutional: Alert and oriented.  Eyes: Conjunctivae are normal.  ENT      Head: Normocephalic and atraumatic.      Nose: No congestion/rhinnorhea.      Mouth/Throat: Mucous membranes are moist.      Neck: No stridor. Hematological/Lymphatic/Immunilogical: No cervical lymphadenopathy. Cardiovascular: Normal rate.  No murmurs, rubs, or gallops.  Respiratory: Normal respiratory effort without tachypnea nor retractions. Crackles in bilateral bases. Gastrointestinal: Soft and non tender. No rebound. No guarding.  Genitourinary: Deferred Musculoskeletal: Left foot with edema.  Neurologic:  Normal speech and language. No gross focal neurologic deficits are appreciated.  Skin:  Erythema and purple coloration to left foot. Psychiatric: Mood and affect are normal. Speech and behavior are normal. Patient exhibits appropriate insight and judgment.  ____________________________________________    LABS (pertinent positives/negatives)  CMP na 135, k 3.9, glu 171, cr 1.97 Lactic 2.2 CBC wbc 7.0, hgb 12.0, plt 147 UA clear,  >500, trace leukocytes, 0-5 rbc and wbc, rare bacteria ____________________________________________   EKG  None  ____________________________________________    RADIOLOGY  Left foot x-ray Soft tissue swelling, no acute osseous abnormality  ____________________________________________   PROCEDURES  Procedures  ____________________________________________   INITIAL IMPRESSION / ASSESSMENT AND PLAN / ED COURSE  Pertinent labs & imaging results that were available during my care of the patient were reviewed by me and considered in my medical decision making (see chart for details).   Patient presents to the emergency department today because of concern for left foot swelling and discoloration. The patient is afebrile here, no white count however does have an elevated lactic acid level. Concern for cellulitis. X-ray was obtained without evidence of osseous involvement at this time. However do think patient would benefit from inpatient admission and IV abx. Discussed with patient. Will plan on admission to the hospitalist service.   ____________________________________________   FINAL CLINICAL IMPRESSION(S) / ED DIAGNOSES  Final diagnoses:  Cellulitis, unspecified cellulitis site  Foot pain, left     Note: This dictation was prepared with Dragon dictation. Any transcriptional errors that result from this process are unintentional     Nance Pear, MD 09/26/20 [phone]

## 2020-09-27 ENCOUNTER — Telehealth: Payer: Self-pay | Admitting: Internal Medicine

## 2020-09-27 ENCOUNTER — Ambulatory Visit: Payer: Medicare Other | Admitting: Family

## 2020-09-27 ENCOUNTER — Encounter: Payer: Self-pay | Admitting: Family Medicine

## 2020-09-27 DIAGNOSIS — E1169 Type 2 diabetes mellitus with other specified complication: Secondary | ICD-10-CM

## 2020-09-27 DIAGNOSIS — E11628 Type 2 diabetes mellitus with other skin complications: Secondary | ICD-10-CM

## 2020-09-27 DIAGNOSIS — L03119 Cellulitis of unspecified part of limb: Secondary | ICD-10-CM

## 2020-09-27 DIAGNOSIS — E782 Mixed hyperlipidemia: Secondary | ICD-10-CM

## 2020-09-27 LAB — BASIC METABOLIC PANEL
Anion gap: 13 (ref 5–15)
BUN: 36 mg/dL — ABNORMAL HIGH (ref 8–23)
CO2: 31 mmol/L (ref 22–32)
Calcium: 9.3 mg/dL (ref 8.9–10.3)
Chloride: 94 mmol/L — ABNORMAL LOW (ref 98–111)
Creatinine, Ser: 1.76 mg/dL — ABNORMAL HIGH (ref 0.61–1.24)
GFR, Estimated: 42 mL/min — ABNORMAL LOW (ref >60)
Glucose, Bld: 139 mg/dL — ABNORMAL HIGH (ref 70–99)
Potassium: 3.5 mmol/L (ref 3.5–5.1)
Sodium: 138 mmol/L (ref 135–145)

## 2020-09-27 LAB — CBC
HCT: 39.8 % (ref 39.0–52.0)
Hemoglobin: 13.2 g/dL (ref 13.0–17.0)
MCH: 32.5 pg (ref 26.0–34.0)
MCHC: 33.2 g/dL (ref 30.0–36.0)
MCV: 98 fL (ref 80.0–100.0)
Platelets: 142 10*3/uL — ABNORMAL LOW (ref 150–400)
RBC: 4.06 MIL/uL — ABNORMAL LOW (ref 4.22–5.81)
RDW: 17.3 % — ABNORMAL HIGH (ref 11.5–15.5)
WBC: 8.1 10*3/uL (ref 4.0–10.5)
nRBC: 0 % (ref 0.0–0.2)

## 2020-09-27 LAB — HEMOGLOBIN A1C
Hgb A1c MFr Bld: 7.4 % — ABNORMAL HIGH (ref 4.8–5.6)
Mean Plasma Glucose: 165.68 mg/dL

## 2020-09-27 LAB — GLUCOSE, CAPILLARY: Glucose-Capillary: 104 mg/dL — ABNORMAL HIGH (ref 70–99)

## 2020-09-27 LAB — CBG MONITORING, ED: Glucose-Capillary: 108 mg/dL — ABNORMAL HIGH (ref 70–99)

## 2020-09-27 MED ORDER — CLONAZEPAM 1 MG PO TABS: 1.0000 mg | ORAL_TABLET | Freq: Every day | ORAL | Status: DC | PRN

## 2020-09-27 MED ORDER — METOLAZONE 2.5 MG PO TABS
2.5000 mg | ORAL_TABLET | ORAL | Status: DC
  Administered 2020-09-28 – 2020-10-03 (×2): 2.5 mg via ORAL
  Filled 2020-09-27 (×3): qty 1

## 2020-09-27 MED ORDER — MELATONIN 5 MG PO TABS: 10.0000 mg | ORAL_TABLET | Freq: Every evening | ORAL | Status: DC | PRN

## 2020-09-27 MED ORDER — INSULIN ASPART 100 UNIT/ML ~~LOC~~ SOLN: 0.0000 [IU] | Freq: Every day | SUBCUTANEOUS | Status: DC

## 2020-09-27 MED ORDER — DOCUSATE SODIUM 100 MG PO CAPS
200.0000 mg | ORAL_CAPSULE | Freq: Every day | ORAL | Status: DC
  Administered 2020-09-27 – 2020-09-28 (×3): 200 mg via ORAL
  Filled 2020-09-27 (×3): qty 2

## 2020-09-27 MED ORDER — INSULIN ASPART 100 UNIT/ML ~~LOC~~ SOLN
0.0000 [IU] | Freq: Three times a day (TID) | SUBCUTANEOUS | Status: DC
  Administered 2020-09-28: 2 [IU] via SUBCUTANEOUS
  Administered 2020-09-29: 3 [IU] via SUBCUTANEOUS
  Administered 2020-09-30: 2 [IU] via SUBCUTANEOUS
  Administered 2020-10-01: 3 [IU] via SUBCUTANEOUS
  Administered 2020-10-01 – 2020-10-02 (×3): 2 [IU] via SUBCUTANEOUS
  Administered 2020-10-03: 1 [IU] via SUBCUTANEOUS
  Administered 2020-10-03: 3 [IU] via SUBCUTANEOUS
  Filled 2020-09-27 (×9): qty 1

## 2020-09-27 NOTE — ED Notes (Signed)
Report received from Mimi RN. Patient care assumed. Patient/RN introduction complete. Will continue to monitor.  

## 2020-09-27 NOTE — Consult Note (Signed)
PODIATRY CONSULTATION  NAME [name] MRN [id number] DOB 22-Jan-1951 DOA 09/26/2020   Reason for consult: Cellulitis left foot Chief Complaint  Patient presents with  . Foot Pain    Consulting physician: Dr. Max Sane, MD  History of present illness: 70 y.o. male presented to the ED yesterday, 09/26/2020, for acute onset of worsening redness, discoloration, and swelling to the left foot.  Patient noticed significant redness in his foot on 09/24/2020.  He cannot recall an incident or injury that would have elicited the symptoms.  Patient does admit to trimming his toenails and trimming the left fourth toenail plate too short and causing bleeding, but this incident occurred a day or 2 after he began to notice symptoms.  Patient currently on IV vancomycin and cefepime.  Podiatry consulted.  Past Medical History:  Diagnosis Date  . CKD (chronic kidney disease) stage 3, GFR 30-59 ml/min (HCC)   . Coronary artery disease    a. 1998 s/p ACS Multi-link BMS to the prox LAD ([place], [place]); b. 07/2015 Cath: LM nl, LAD patent stent, LCX min irregs, OM1/2 min irregs, OM3 nl, RCA min irregs; c. 07/2020 Cath: LM nl, LAD 50p/m, 70d, D2 70, LCX 40p/20m, OM3 40, RCA 60d->Med Rx.  . Diabetes mellitus type 2, uncontrolled ([place])    a. A1c 11.0 in 07/2015.  [name] HFrEF    a. EF 25% by cath in 2013; b. Echo in 07/2015 showing EF of 15-20%, moderate MR, moderate Pulm HTN, severely dilated IVC; c. 11/2016 Echo: EF 55-60%, no rwma, Gr1 DD, mildly dil LA, nl RV fxn. d. 12/2019 Echo: EF 40-45%, e. 07/2020 Echo: EF 20-25%, sev red RV fxn, mod dil LA, mild-mod MR.  [name] History of kidney stones   . Hypertension   . Ischemic cardiomyopathy   . Kidney stones    Left  . Paroxysmal atrial fibrillation ([place])    a. Dx 07/2015; b. CHA2DS2VASc = 5-->eliquis. Rhythm control w/ amiodarone.  . Pollen allergies 09-21-15   pt called and stated that he woke up and had some drainage-pt states he did not see the color of the  drainage-pt denies running a fever and this only happened once-pt instructed to call Dr Audree Bane office if he starts running a fever or if the color of drainage becomes yellow/green  . Psoriasis   . Pulmonary hypertension ([place])   . Renal disorder    kidney stone  . Sleep apnea     CBC Latest Ref Rng & Units 09/27/2020 09/26/2020 07/17/2020  WBC 4.0 - 10.5 K/uL 8.1 7.0 5.1  Hemoglobin 13.0 - 17.0 g/dL 13.2 12.0(L) 13.3  Hematocrit 39.0 - 52.0 % 39.8 36.6(L) 40.5  Platelets 150 - 400 K/uL 142(L) 147(L) 124(L)    BMP Latest Ref Rng & Units 09/27/2020 09/26/2020 09/16/2020  Glucose 70 - 99 mg/dL 139(H) 171(H) 321(H)  BUN 8 - 23 mg/dL 36(H) 38(H) 87(H)  Creatinine 0.61 - 1.24 mg/dL 1.76(H) 1.97(H) 2.03(H)  BUN/Creat Ratio 10 - 24 - - -  Sodium 135 - 145 mmol/L 138 135 137  Potassium 3.5 - 5.1 mmol/L 3.5 3.9 4.3  Chloride 98 - 111 mmol/L 94(L) 94(L) 96(L)  CO2 22 - 32 mmol/L 31 27 30   Calcium 8.9 - 10.3 mg/dL 9.3 9.1 9.3      Physical Exam: General: The patient is alert and oriented x3 in no acute distress.   Dermatology: Erythema with edema noted to the left foot diffusely.  Skin is warm to touch.  Hyperpigmentation  and discoloration around the ankles with superficial scaling consistent with a venous insufficiency There is a very superficial area of skin breakdown on the dorsum of the left foot.  There is no drainage in this area.  There is no fluctuance. Hyperkeratotic dystrophic nails noted 1-5 left with dried blood on the fourth toe left secondary to trimming the nail too short.  He states this occurred after the patient began to notice symptoms to his left foot and he believes that is unrelated  Vascular: Palpable pedal pulses bilaterally.  Warmth with edema and erythema noted left foot.  Capillary refill immediate  Neurological: Epicritic and protective threshold grossly intact bilaterally.   Musculoskeletal Exam: No crepitus noted with palpation throughout the left forefoot.  No  fluctuance noted.  Based on clinical exam there is not appear to be any underlying abscess.    ASSESSMENT/PLAN OF CARE 1.  Left foot diabetic severe cellulitis - At the moment continue IV antibiotic therapy with vancomycin and cefepime as per admitting physician - Recommend observation for now - Recommend possible vascular work-up with ABI w/ TBI to ensure there is no underlying vascular compromise, although clinically it does not appear so - There is no improvement MRI may be warranted to rule out any underlying abscess -Podiatry will follow    Edrick Kins, DPM [facility]  Dr. Edrick Kins, DPM    [address], [place] [address]                Office [phone]  Fax [phone]

## 2020-09-27 NOTE — Telephone Encounter (Signed)
Patient calling  States that he is in the hospital with an infection on his left foot If Dr End is working in the hospital this week - he would like for him to come by room 215 Patient will keep 04/29 office appointment for now but may call to r/s  Please advise

## 2020-09-27 NOTE — Hospital Course (Signed)
70 y.o. Caucasian male with medical history significant for coronary artery disease, status post PCI and stents, type 2 diabetes mellitus, stage IIIb chronic kidney disease, hypertension, ischemic cardiomyopathy, paroxysmal atrial fibrillation on Eliquis and sleep apnea admitted for left diabetic foot infection  4/26-> await podiatry input

## 2020-09-27 NOTE — Plan of Care (Signed)
Patient oriented to room and call bell.  Patient ambulated independently to restroom with his cane on arrival to room.

## 2020-09-27 NOTE — ED Notes (Signed)
This RN contacted pharmacy for meds to be verified.

## 2020-09-27 NOTE — Progress Notes (Signed)
1        [place] [facility] [place] NAME: [name]    MR#:  [id number]  DATE OF BIRTH:  20-Nov-1950  SUBJECTIVE:  CHIEF COMPLAINT:   Chief Complaint  Patient presents with  . Foot Pain  Patient reports 3 out of 10 pain but otherwise no much complaint, just worried about his foot and wants to see what podiatry has to offer REVIEW OF SYSTEMS:  Review of Systems  Constitutional: Negative for diaphoresis, fever, malaise/fatigue and weight loss.  HENT: Negative for ear discharge, ear pain, hearing loss, nosebleeds, sore throat and tinnitus.   Eyes: Negative for blurred vision and pain.  Respiratory: Negative for cough, hemoptysis, shortness of breath and wheezing.   Cardiovascular: Negative for chest pain, palpitations, orthopnea and leg swelling.  Gastrointestinal: Negative for abdominal pain, blood in stool, constipation, diarrhea, heartburn, nausea and vomiting.  Genitourinary: Negative for dysuria, frequency and urgency.  Musculoskeletal: Positive for joint pain. Negative for back pain and myalgias.  Skin: Positive for rash. Negative for itching.  Neurological: Negative for dizziness, tingling, tremors, focal weakness, seizures, weakness and headaches.  Psychiatric/Behavioral: Negative for depression. The patient is not nervous/anxious.    DRUG ALLERGIES:   Allergies  Allergen Reactions  . Other Hives and Other (See Comments)    Blue cheese Blue cheese Blue cheese   VITALS:  Blood pressure 137/71, pulse 81, temperature 97.7 F (36.5 C), temperature source Oral, resp. rate (!) 23, height 6' (1.829 m), weight 113.4 kg, SpO2 94 %. PHYSICAL EXAMINATION:  Physical Exam  70 year old male lying in the bed comfortably without any acute distress Eyes pupils equal, reactive to light and accommodation Lungs clear to auscultation bilaterally no wheezing rales rhonchi or crepitation Cardiovascular S1-S2 normal no murmur rales or gallop Abdomen soft, obese,  benign Extremities hyperpigmentation on the shin of both legs with scaling, dry skin and mild erythema Neuro awake and alert, nonfocal exam Skin: See pictures      LABORATORY PANEL:  Male CBC Recent Labs  Lab 09/27/20 0600  WBC 8.1  HGB 13.2  HCT 39.8  PLT 142*   ------------------------------------------------------------------------------------------------------------------ Chemistries  Recent Labs  Lab 09/26/20 1717 09/27/20 0600  NA 135 138  K 3.9 3.5  CL 94* 94*  CO2 27 31  GLUCOSE 171* 139*  BUN 38* 36*  CREATININE 1.97* 1.76*  CALCIUM 9.1 9.3  AST 23  --   ALT 20  --   ALKPHOS 86  --   BILITOT 1.0  --    RADIOLOGY:  DG Foot Complete Left  Result Date: 09/26/2020 CLINICAL DATA:  Foot swelling concern for infection EXAM: LEFT FOOT - COMPLETE 3+ VIEW COMPARISON:  Ankle radiograph February 06, 2019 FINDINGS: There is no evidence of fracture or dislocation. Calcaneal enthesophytes. Diffuse soft tissue swelling. IMPRESSION: Diffuse soft tissue swelling.  No acute osseous abnormality. Electronically Signed   By: Dahlia Bailiff MD   On: 09/26/2020 19:33   ASSESSMENT AND PLAN:  70 y.o. Caucasian male with medical history significant for coronary artery disease, status post PCI and stents, type 2 diabetes mellitus, stage IIIb chronic kidney disease, hypertension, ischemic cardiomyopathy, paroxysmal atrial fibrillation on Eliquis and sleep apnea admitted for left diabetic foot infection  4/26-> await podiatry input  1.  Left diabetic foot infection. -  Continue IV vancomycin and cefepime. -  Pending podiatry input.  Pain meds as needed  2.  Type 2 diabetes mellitus with stage IIIb chronic kidney disease. - continue  Jardiance and Glucotrol.  Continue insulin Lantus for basal coverage - Consult diabetic nurse.  Consider sliding scale as needed  3.  Coronary artery disease. - continue his Coreg, Cozaar.  4.  Paroxysmal atrial fibrillation. - continue  amiodarone and Eliquis.  5.  Essential hypertension. - continue Coreg and Cozaar.  6.  Dyslipidemia. - continue statin therapy.  7.  Obesity Body mass index is 33.91 kg/m.  Net IO Since Admission: -2,204.64 mL [09/27/20 1458]      Status is: Inpatient  Remains inpatient appropriate because:Ongoing diagnostic testing needed not appropriate for outpatient work up   Dispo: The patient is from: Home              Anticipated d/c is to: Home              Patient currently is not medically stable to d/c.   Difficult to place patient No   DVT prophylaxis:        apixaban (ELIQUIS) tablet 5 mg     Family Communication: discussed with patient   All the records are reviewed and case discussed with Care Management/Social Worker. Management plans discussed with the patient, nursing and they are in agreement.  CODE STATUS: Full Code Level of care: Med-Surg  TOTAL TIME TAKING CARE OF THIS PATIENT: 35 minutes.   More than 50% of the time was spent in counseling/coordination of care: YES  POSSIBLE D/C IN 2-3 DAYS, DEPENDING ON CLINICAL CONDITION.  And podiatry work-up   Max Sane M.D on 09/27/2020 at 2:58 PM  [facility]   CC: Primary care physician; Vidal Schwalbe, MD  Note: This dictation was prepared with Dragon dictation along with smaller phrase technology. Any transcriptional errors that result from this process are unintentional.

## 2020-09-27 NOTE — Progress Notes (Signed)
Received consult via secure chat. Dr. Daylene Katayama who is on call today for [facility] will see patient today.

## 2020-09-27 NOTE — Progress Notes (Signed)
PHARMACIST - PHYSICIAN ORDER COMMUNICATION  CONCERNING: P&T Medication Policy on Herbal Medications  DESCRIPTION:  This patient's order for:  Bromelain  has been noted.  This product(s) is classified as an "herbal" or natural product. Due to a lack of definitive safety studies or FDA approval, nonstandard manufacturing practices, plus the potential risk of unknown drug-drug interactions while on inpatient medications, the Pharmacy and Therapeutics Committee does not permit the use of "herbal" or natural products of this type within [facility].   ACTION TAKEN: The pharmacy department is unable to verify this order at this time and your patient has been informed of this safety policy. Please reevaluate patient's clinical condition at discharge and address if the herbal or natural product(s) should be resumed at that time.

## 2020-09-27 NOTE — ED Notes (Signed)
Pt awake, upset due to not getting bedtime meds on time. Per Mimi previous RN pharmacy has not verified meds yet. Pt states he will just wait to take them in morning. Will call pharmacy to verify and reschedule meds for the am. Pt with no other concerns at this time.

## 2020-09-27 NOTE — Progress Notes (Signed)
PHARMACIST - PHYSICIAN ORDER COMMUNICATION  CONCERNING: P&T Medication Policy on Herbal Medications  DESCRIPTION:  This patient's order for:  [place], [employer] Q-10  has been noted.  This product(s) is classified as an "herbal" or natural product. Due to a lack of definitive safety studies or FDA approval, nonstandard manufacturing practices, plus the potential risk of unknown drug-drug interactions while on inpatient medications, the Pharmacy and Therapeutics Committee does not permit the use of "herbal" or natural products of this type within [facility].   ACTION TAKEN: The pharmacy department is unable to verify this order at this time and your patient has been informed of this safety policy. Please reevaluate patient's clinical condition at discharge and address if the herbal or natural product(s) should be resumed at that time.

## 2020-09-28 ENCOUNTER — Inpatient Hospital Stay: Payer: Medicare Other

## 2020-09-28 ENCOUNTER — Encounter: Payer: Self-pay | Admitting: Family Medicine

## 2020-09-28 DIAGNOSIS — M79672 Pain in left foot: Secondary | ICD-10-CM

## 2020-09-28 DIAGNOSIS — E119 Type 2 diabetes mellitus without complications: Secondary | ICD-10-CM

## 2020-09-28 DIAGNOSIS — L0390 Cellulitis, unspecified: Secondary | ICD-10-CM

## 2020-09-28 DIAGNOSIS — I5022 Chronic systolic (congestive) heart failure: Secondary | ICD-10-CM

## 2020-09-28 LAB — CBC
HCT: 39.3 % (ref 39.0–52.0)
Hemoglobin: 12.9 g/dL — ABNORMAL LOW (ref 13.0–17.0)
MCH: 31.7 pg (ref 26.0–34.0)
MCHC: 32.8 g/dL (ref 30.0–36.0)
MCV: 96.6 fL (ref 80.0–100.0)
Platelets: 136 10*3/uL — ABNORMAL LOW (ref 150–400)
RBC: 4.07 MIL/uL — ABNORMAL LOW (ref 4.22–5.81)
RDW: 17.2 % — ABNORMAL HIGH (ref 11.5–15.5)
WBC: 7.7 10*3/uL (ref 4.0–10.5)
nRBC: 0 % (ref 0.0–0.2)

## 2020-09-28 LAB — BASIC METABOLIC PANEL
Anion gap: 12 (ref 5–15)
BUN: 45 mg/dL — ABNORMAL HIGH (ref 8–23)
CO2: 31 mmol/L (ref 22–32)
Calcium: 9.1 mg/dL (ref 8.9–10.3)
Chloride: 96 mmol/L — ABNORMAL LOW (ref 98–111)
Creatinine, Ser: 2.04 mg/dL — ABNORMAL HIGH (ref 0.61–1.24)
GFR, Estimated: 35 mL/min — ABNORMAL LOW (ref >60)
Glucose, Bld: 125 mg/dL — ABNORMAL HIGH (ref 70–99)
Potassium: 4.2 mmol/L (ref 3.5–5.1)
Sodium: 139 mmol/L (ref 135–145)

## 2020-09-28 LAB — GLUCOSE, CAPILLARY
Glucose-Capillary: 103 mg/dL — ABNORMAL HIGH (ref 70–99)
Glucose-Capillary: 199 mg/dL — ABNORMAL HIGH (ref 70–99)
Glucose-Capillary: 109 mg/dL — ABNORMAL HIGH (ref 70–99)
Glucose-Capillary: 140 mg/dL — ABNORMAL HIGH (ref 70–99)

## 2020-09-28 LAB — MAGNESIUM: Magnesium: 2.3 mg/dL (ref 1.7–2.4)

## 2020-09-28 IMAGING — MR MR FOOT*L* W/O CM
5 series · 40 of 40 positions shown · non-contrast
Comparison: Radiographs [DATE]

CLINICAL DATA: Left foot pain and swelling.  Diabetic.

EXAM:
MRI OF THE LEFT FOOT WITHOUT CONTRAST
TECHNIQUE: Multiplanar, multisequence MR imaging of the foot left foot was
performed. No intravenous contrast was administered.

[Series 4: T1 · coronal · left · 3.0mm · 0.38mm/px · 12 of 57 slices shown (1 of 2)]
[im 1/57]
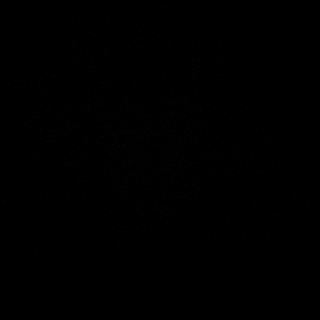
[im 6/57]
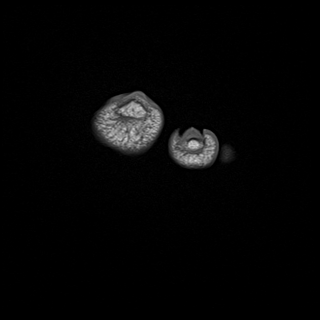
[im 11/57]
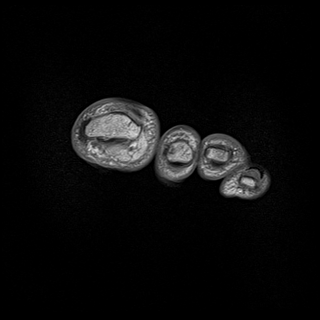
[im 16/57]
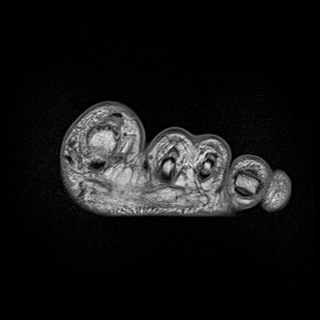
[im 21/57]
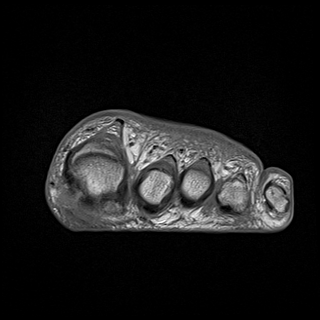
[im 26/57]
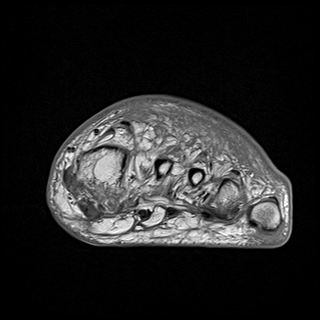
[im 31/57]
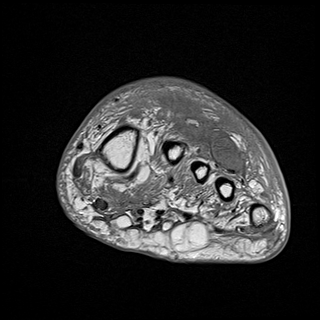
[im 36/57]
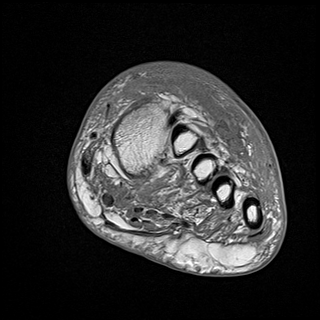
[im 41/57]
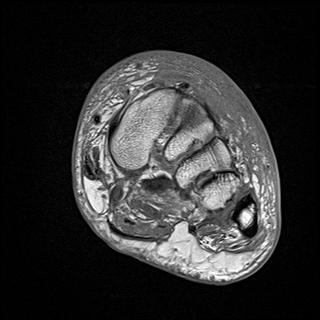
[im 46/57]
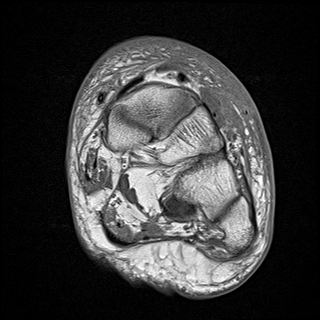
[im 51/57]
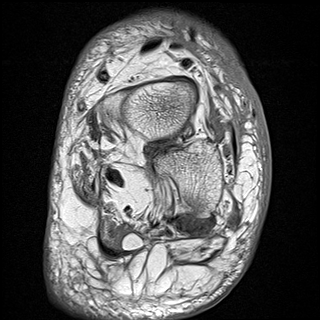
[im 57/57]
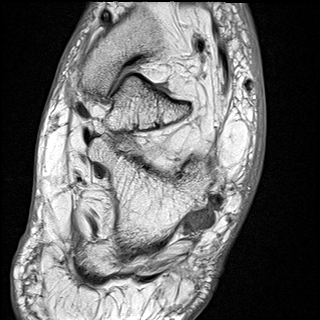

[Series 6: T2 · coronal · left · 3.0mm · 0.50mm/px · 10 of 54 slices shown (1 of 2)]
[im 1/54]
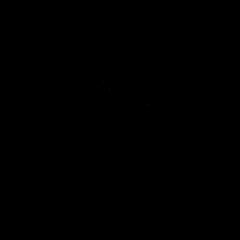
[im 6/54]
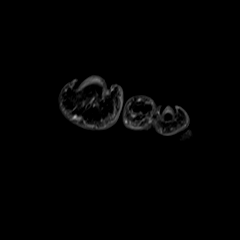
[im 12/54]
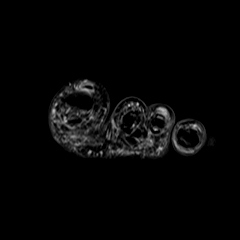
[im 18/54]
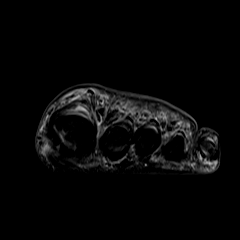
[im 24/54]
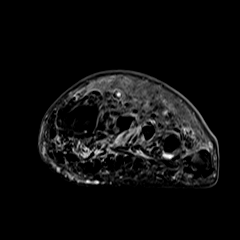
[im 30/54]
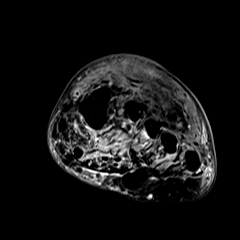
[im 36/54]
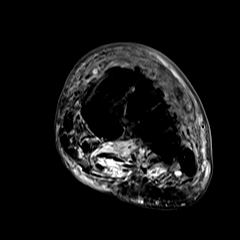
[im 42/54]
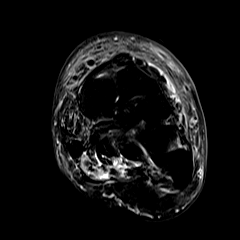
[im 48/54]
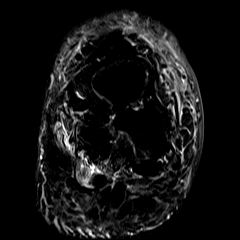
[im 54/54]
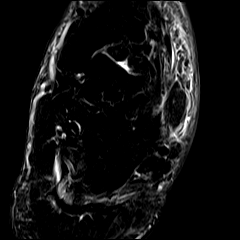

[Series 7: T1 · axial · left · 3.0mm · 0.78mm/px · z∈[-113,+3]mm · 6 of 32 slices shown (2 of 2)]
[im 1/32]
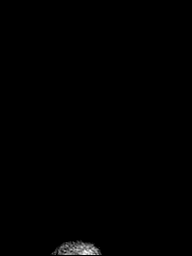
[im 7/32]
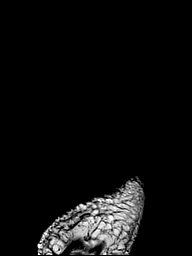
[im 13/32]
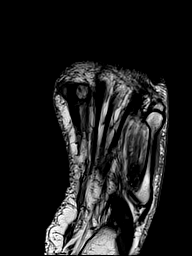
[im 19/32]
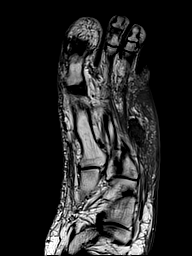
[im 25/32]
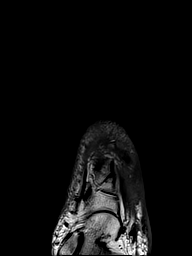
[im 32/32]
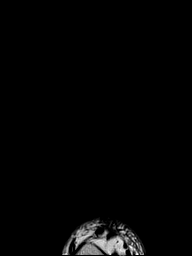

[Series 9: T2 · axial · left · 3.0mm · 0.78mm/px · z∈[-113,+3]mm · 6 of 32 slices shown (2 of 2)]
[im 1/32]
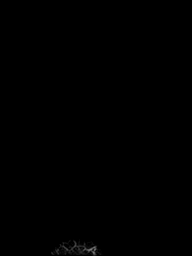
[im 7/32]
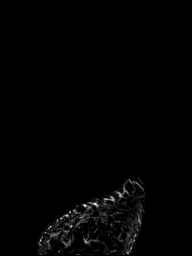
[im 13/32]
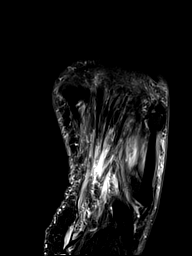
[im 19/32]
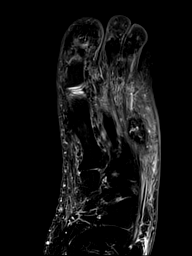
[im 25/32]
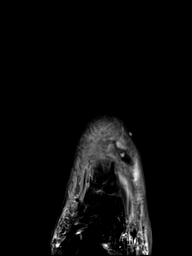
[im 32/32]
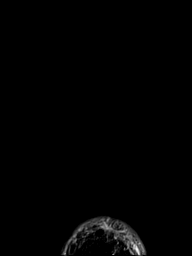

[Series 10: STIR · sagittal · left · 3.0mm · 0.62mm/px · 6 of 31 slices shown]
[im 1/31]
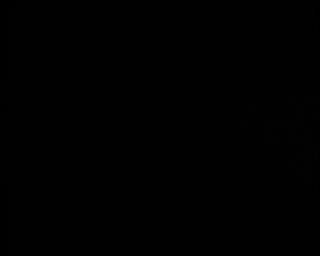
[im 7/31]
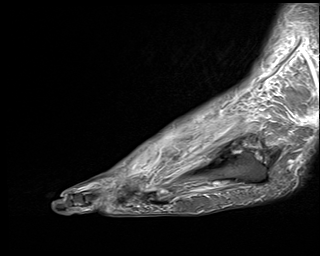
[im 13/31]
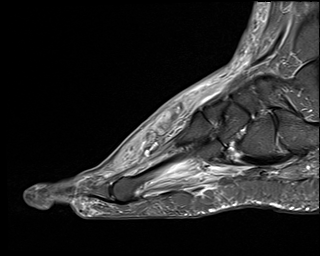
[im 19/31]
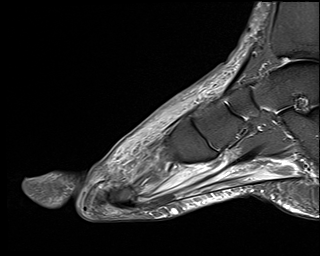
[im 25/31]
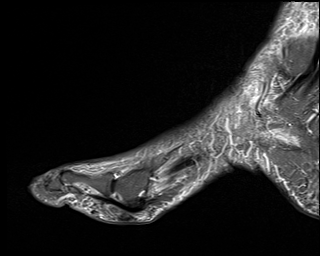
[im 31/31]
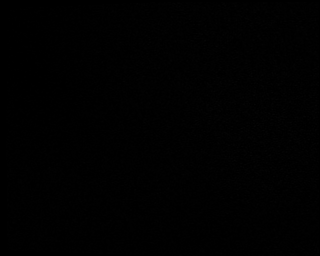

[40 of 40 positions shown; findings below may reference images not displayed]

FINDINGS: Diffuse subcutaneous soft tissue swelling/edema/fluid most notably
along the dorsum of the forefoot and midfoot consistent with
cellulitis. No obvious open wound. There are several small fluid
collections likely representing micro abscesses. No discrete
drainable abscess is identified.

Diffuse myofasciitis without definite findings for pyomyositis.

No MR findings to suggest septic arthritis or osteomyelitis.

The major tendons and ligaments are intact.
IMPRESSION: 1. Diffuse cellulitis and myofasciitis.
2. Small fluid collections in the dorsal subcutaneous tissues likely
microabscesses but no discrete drainable abscess.
3. No MR findings to suggest septic arthritis or osteomyelitis.

## 2020-09-28 MED ORDER — INSULIN GLARGINE 100 UNIT/ML ~~LOC~~ SOLN
10.0000 [IU] | Freq: Every day | SUBCUTANEOUS | Status: DC
  Administered 2020-09-28 – 2020-10-01 (×3): 10 [IU] via SUBCUTANEOUS
  Filled 2020-09-28 (×5): qty 0.1

## 2020-09-28 NOTE — TOC Initial Note (Signed)
Transition of Care [facility]) - Initial/Assessment Note    Patient Details  Name: [name] MRN: [id number] Date of Birth: 02/12/1951  [facility]) CM/SW Contact:    Candie Chroman, LCSW Phone Number: 09/28/2020, 12:43 PM  Clinical Narrative:  Readmission prevention screen complete. CSW met with patient. No supports at bedside. CSW introduced role and explained that discharge planning would be discussed. Patient lives home with his roommate and three dogs. PCP is Vidal Schwalbe, MD. Patient has transportation to appointments. Pharmacy is [facility]. No issues obtaining medications. No home health prior to admission. He has had home health in the past but the agency stopped coming out because of his dogs. Patient uses a single-point cane as needed. No further concerns. CSW encouraged patient to contact CSW as needed. CSW will continue to follow patient for support and facilitate return home when stable.                Expected Discharge Plan: Home/Self Care Barriers to Discharge: Continued Medical Work up   Patient Goals and CMS Choice        Expected Discharge Plan and Services Expected Discharge Plan: Home/Self Care       Living arrangements for the past 2 months: Single Family Home                                      Prior Living Arrangements/Services Living arrangements for the past 2 months: Single Family Home Lives with:: Roommate,Pets Patient language and need for interpreter reviewed:: Yes Do you feel safe going back to the place where you live?: Yes      Need for Family Participation in Patient Care: Yes (Comment) Care giver support system in place?: Yes (comment) Current home services: DME Criminal Activity/Legal Involvement Pertinent to Current Situation/Hospitalization: No - Comment as needed  Activities of Daily Living Home Assistive Devices/Equipment: None ADL Screening (condition at time of admission) Patient's cognitive ability  adequate to safely complete daily activities?: Yes Is the patient deaf or have difficulty hearing?: No Does the patient have difficulty seeing, even when wearing glasses/contacts?: No Does the patient have difficulty concentrating, remembering, or making decisions?: No Patient able to express need for assistance with ADLs?: Yes Does the patient have difficulty dressing or bathing?: No Independently performs ADLs?: Yes (appropriate for developmental age) Does the patient have difficulty walking or climbing stairs?: No Weakness of Legs: None Weakness of Arms/Hands: None  Permission Sought/Granted                  Emotional Assessment Appearance:: Appears stated age Attitude/Demeanor/Rapport: Engaged,Gracious Affect (typically observed): Accepting,Appropriate,Calm,Pleasant Orientation: : Oriented to Self,Oriented to Place,Oriented to  Time,Oriented to Situation Alcohol / Substance Use: Not Applicable Psych Involvement: No (comment)  Admission diagnosis:  Cellulitis in diabetic foot ([place]) [I62.703, J00.938] Foot pain, left [M79.672] Cellulitis, unspecified cellulitis site [L03.90] Patient Active Problem List   Diagnosis Date Noted  . Cellulitis in diabetic foot ([place]) 09/26/2020  . Abnormal LFTs 07/19/2020  . Chronic kidney disease, stage IV (severe) ([place]) 07/08/2020  . Left nephrolithiasis 12/10/2019  . Acute pyelonephritis 11/12/2019  . Chronic diastolic CHF (congestive heart failure) ([place]) 05/12/2018  . Chronic heart failure with preserved ejection fraction (HFpEF) ([place]) 12/05/2017  . NICM (nonischemic cardiomyopathy) ([place]) 04/18/2017  . Non-rheumatic mitral regurgitation 04/18/2017  . Chronic kidney disease, stage III (moderate) ([place]) 04/18/2017  . Type 2 diabetes  mellitus with diabetic polyneuropathy, without long-term current use of insulin ([place]) 02/27/2017  . Hyperlipidemia due to type 2 diabetes mellitus ([place]) 02/27/2017  . Left ureteral stone 10/25/2015  . Coronary  artery disease involving native coronary artery of native heart without angina pectoris 09/22/2015  . Acute on chronic HFrEF (heart failure with reduced ejection fraction) ([place]) 09/22/2015  . First degree AV block 09/22/2015  . Essential hypertension 09/22/2015  . Hyperlipidemia LDL goal <70 09/22/2015  . Hypotension 08/09/2015  . Hydronephrosis with renal and ureteral calculus obstruction   . Kidney stone on left side   . OSA (obstructive sleep apnea)   . Hyponatremia 07/13/2015  . Uncontrolled type 2 diabetes mellitus ([place]) 07/13/2015  . Pulmonary hypertension ([place])   . Cardiorenal syndrome   . Morbid obesity due to excess calories ([place])   . Paroxysmal atrial fibrillation ([place])    PCP:  Vidal Schwalbe, MD Pharmacy:   [place], [place] - [address] [place] [address] Phone: [phone] Fax: [phone]     Social Determinants of Health (SDOH) Interventions    Readmission Risk Interventions Readmission Risk Prevention Plan 09/28/2020 12/11/2019  Transportation Screening Complete Complete  PCP or Specialist Appt within 3-5 Days Complete Complete  Social Work Consult for [place] Planning/Counseling Complete Complete  Palliative Care Screening Not Applicable Not Applicable  Medication Review Press photographer) Complete Complete  Some recent data might be hidden

## 2020-09-28 NOTE — Progress Notes (Signed)
  Subjective:  Patient ID: [name], male    DOB: [birth year],  MRN: [id number]  Feeling well other than swelling and pain in his foot  Denies fevers chills nausea vomiting Objective:   Vitals:   09/28/20 1118 09/28/20 1617  BP: 118/68 117/75  Pulse: 77 73  Resp: 18 19  Temp:  97.7 F (36.5 C)  SpO2: 96% 97%   General AA&O x3. Normal mood and affect.  Vascular Dorsalis pedis and posterior tibial pulses nonpalpable significant edema  Neurologic Epicritic sensation grossly reduced.  Dermatologic  mottled erythematous left foot with what appears to be a developing abscess on the dorsal left midfoot  Orthopedic: MMT 5/5 in dorsiflexion, plantarflexion, inversion, and eversion. Normal joint ROM without pain or crepitus.        Assessment & Plan:  Patient was evaluated and treated and all questions answered.   -Concerned that he has abscess developing as well as possible critical limb ischemia -Stat MRI and vascular testing ordered -Continue antibiotics -We will follow closely  Criselda Peaches, DPM  Accessible via secure chat for questions or concerns.

## 2020-09-28 NOTE — Progress Notes (Addendum)
Inpatient Diabetes Program Recommendations  AACE/ADA: New Consensus Statement on Inpatient Glycemic Control  Target Ranges:  Prepandial:   less than 140 mg/dL      Peak postprandial:   less than 180 mg/dL (1-2 hours)      Critically ill patients:  140 - 180 mg/dL  Results for [name], [name] (MRN [id number]) as of 09/28/2020 07:56  Ref. Range 09/27/2020 09:29 09/27/2020 21:26 09/28/2020 07:48  Glucose-Capillary Latest Ref Range: 70 - 99 mg/dL 108 (H) 104 (H) 103 (H)  Results for [name], [name] (MRN [id number]) as of 09/28/2020 07:56  Ref. Range 08/26/2020 12:29 09/27/2020 06:00  Hemoglobin A1C Latest Ref Range: 4.8 - 5.6 % 7.0 (H) 7.4 (H)    Review of Glycemic Control  Diabetes history: DM2 Outpatient Diabetes medications: Toujeo 28 units daily, Jardiance 10 mg daily, Glipizide 10 mg BID, Trulicity 1.5 mg Qweek (Monday) Current orders for Inpatient glycemic control: Lantus 28 units daily, Novolog 0-9 units TID with meals, Novolog 0-5 units QHS, Glipizide 10 mg BID, Jardiance 10 mg daily  Inpatient Diabetes Program Recommendations:    Insulin: Please consider decreasing Lantus to 10 units daily.  NOTE: Spoke with patient over phone about diabetes and home regimen for diabetes control. Patient reports being followed by Dr. Honor Junes (Endocrinologist) for diabetes management and currently taking Toujeo 28 units daily (if fasting glucose is 80 mg/dl or higher), Jardiance 10 mg daily, Glipizide 10 mg BID, Trulicity 1.5 mg Qweek (Monday) as an outpatient for diabetes control. Patient reports taking DM medications as prescribed and last seen Dr. Honor Junes on 08/26/20.  Patient reports that his glucose usually runs well and he ends up using the Toujeo 3-4 times a week usually.  Patient records glucose in an app on his phone and reports that his fasting glucose was 66 mg/dl on 09/23/20, 123 mg/dl on 09/24/20, 78 mg/dl on 09/25/20, and 87 mg/dl on 09/26/20. Patient reports he last took Toujeo on morning of  09/26/20. Toujeo can work up to 36 hours.  Discussed current glucose trends and DM medications as ordered. Patient did not receive any Lantus on 09/27/20 and fasting glucose 103 mg/dl today. Patient reports he ate graham crackers and peanut butter last night before bed.  Informed patient that I would request that Lantus be decreased and if he felt that he did not need it, he could let the nurse know and they could discuss with the attending provider.   Patient verbalized understanding of information discussed and reports no further questions at this time related to diabetes.  Thanks, Barnie Alderman, RN, MSN, CDE Diabetes Coordinator Inpatient Diabetes Program [phone] (Team Pager)

## 2020-09-28 NOTE — Progress Notes (Signed)
PROGRESS NOTE    [name] [facility]  XVQ:[id number] DOB: 1950/06/10 DOA: 09/26/2020 PCP: Vidal Schwalbe, MD    Chief Complaint  Patient presents with  . Foot Pain    Brief Narrative:  [name] is a 70 y.o. Caucasian male with medical history significant for coronary artery disease, status post PCI and stents, type 2 diabetes mellitus, stage IIIb chronic kidney disease, hypertension, ischemic cardiomyopathy, paroxysmal atrial fibrillation on Eliquis and sleep apnea, who presented to the emergency room with acute onset of worsening left foot swelling with associated pain and discoloration, warmth and tenderness after walking from a while shopping on Saturday.   Subjective:  Currently denies pain at rest, no fever, no made at bedside  Assessment & Plan:   Active Problems:   Cellulitis in diabetic foot (HCC)   Diabetic foot infection, left -Blood culture no growth, foot x-ray did not show osteomyelitis -On IV Vanco and cefepime -will follow podiatry recommendation  Insulin-dependent type 2 diabetes, uncontrolled, A1c 7.4, with hyperglycemia -Adjust insulin as needed   Paroxysmal A. Fib Currently in sinus rhythm, continue Coreg, amiodarone, Eliquis  Chronic systolic heart failure Euvolemic Continue home medication Coreg, Jardiance, losartan -Home diuretic torsemide and metolazone    The patient's BMI is: Body mass index is 33.91 kg/m.[name]      Unresulted Labs (From admission, onward)          Start     Ordered   10/03/20 0500  Creatinine, serum  (enoxaparin (LOVENOX)    CrCl >/= 30 ml/min)  Weekly,   STAT     Comments: while on enoxaparin therapy    09/26/20 2021   09/29/20 0500  Creatinine, serum  Tomorrow morning,   R       Question:  Specimen collection method  Answer:  Lab=Lab collect   09/28/20 1047            DVT prophylaxis:  apixaban (ELIQUIS) tablet 5 mg   Code Status: Full Family Communication: Roommate at bedside with  permission Disposition:   Status is: Inpatient   Dispo: The patient is from: Home              Anticipated d/c is to: Home              Anticipated d/c date is: To be determined, currently on IV antibiotics                Consultants:   Podiatry  Procedures:   None  Antimicrobials:   Anti-infectives (From admission, onward)   Start     Dose/Rate Route Frequency Ordered Stop   09/27/20 2000  vancomycin (VANCOCIN) IVPB 1000 mg/200 mL premix        1,000 mg 200 mL/hr over 60 Minutes Intravenous Every 24 hours 09/26/20 2040     09/26/20 2200  ceFEPIme (MAXIPIME) 2 g in sodium chloride 0.9 % 100 mL IVPB        2 g 200 mL/hr over 30 Minutes Intravenous Every 12 hours 09/26/20 2025     09/26/20 2030  vancomycin (VANCOCIN) IVPB 1000 mg/200 mL premix  Status:  Discontinued        1,000 mg 200 mL/hr over 60 Minutes Intravenous  Once 09/26/20 2021 09/26/20 2024   09/26/20 2030  ceFEPIme (MAXIPIME) 2 g in sodium chloride 0.9 % 100 mL IVPB  Status:  Discontinued        2 g 200 mL/hr over 30 Minutes Intravenous  Once 09/26/20 2021 09/26/20 2025  09/26/20 2030  vancomycin (VANCOREADY) IVPB 1500 mg/300 mL        1,500 mg 150 mL/hr over 120 Minutes Intravenous  Once 09/26/20 2023 09/27/20 0132   09/26/20 1845  vancomycin (VANCOCIN) IVPB 1000 mg/200 mL premix        1,000 mg 200 mL/hr over 60 Minutes Intravenous  Once 09/26/20 1839 09/26/20 2000          Objective: Vitals:   09/28/20 0525 09/28/20 0746 09/28/20 1118 09/28/20 1617  BP: 109/66 121/66 118/68 117/75  Pulse: 67 70 77 73  Resp: 18 16 18 19   Temp: 97.9 F (36.6 C) 98 F (36.7 C)  97.7 F (36.5 C)  TempSrc:  Oral    SpO2: 99% 100% 96% 97%  Weight:      Height:        Intake/Output Summary (Last 24 hours) at 09/28/2020 1806 Last data filed at 09/28/2020 1643 Gross per 24 hour  Intake 1440 ml  Output 3700 ml  Net -2260 ml   Filed Weights   09/26/20 1714  Weight: 113.4 kg    Examination:  General exam:  calm, NAD Respiratory system: Clear to auscultation. Respiratory effort normal. Cardiovascular system: S1 & S2 heard, RRR. No JVD, no murmur, No pedal edema. Gastrointestinal system: Abdomen is nondistended, soft and nontender. Normal bowel sounds heard. Central nervous system: Alert and oriented. No focal neurological deficits. Extremities: left foot cellulitis, chronic venous stasis skin changes, no leg edema Skin: No rashes, lesions or ulcers Psychiatry: Judgement and insight appear normal. Mood & affect appropriate.     Data Reviewed: I have personally reviewed following labs and imaging studies  CBC: Recent Labs  Lab 09/26/20 1717 09/27/20 0600 09/28/20 0534  WBC 7.0 8.1 7.7  NEUTROABS 4.6  --   --   HGB 12.0* 13.2 12.9*  HCT 36.6* 39.8 39.3  MCV 96.6 98.0 96.6  PLT 147* 142* 136*    Basic Metabolic Panel: Recent Labs  Lab 09/26/20 1717 09/27/20 0600 09/28/20 0534  NA 135 138 139  K 3.9 3.5 4.2  CL 94* 94* 96*  CO2 27 31 31   GLUCOSE 171* 139* 125*  BUN 38* 36* 45*  CREATININE 1.97* 1.76* 2.04*  CALCIUM 9.1 9.3 9.1  MG  --   --  2.3    GFR: Estimated Creatinine Clearance: 45 mL/min (A) (by C-G formula based on SCr of 2.04 mg/dL (H)).  Liver Function Tests: Recent Labs  Lab 09/26/20 1717  AST 23  ALT 20  ALKPHOS 86  BILITOT 1.0  PROT 7.8  ALBUMIN 3.9    CBG: Recent Labs  Lab 09/27/20 0929 09/27/20 2126 09/28/20 0748 09/28/20 1119 09/28/20 1619  GLUCAP 108* 104* 103* 199* 109*     Recent Results (from the past 240 hour(s))  Resp Panel by RT-PCR (Flu A&B, Covid) Nasopharyngeal Swab     Status: None   Collection Time: 09/26/20  8:02 PM   Specimen: Nasopharyngeal Swab; Nasopharyngeal(NP) swabs in vial transport medium  Result Value Ref Range Status   SARS Coronavirus 2 by RT PCR NEGATIVE NEGATIVE Final    Comment: (NOTE) SARS-CoV-2 target nucleic acids are NOT DETECTED.  The SARS-CoV-2 RNA is generally detectable in upper  respiratory specimens during the acute phase of infection. The lowest concentration of SARS-CoV-2 viral copies this assay can detect is 138 copies/mL. A negative result does not preclude SARS-Cov-2 infection and should not be used as the sole basis for treatment or other patient management decisions. A negative  result may occur with  improper specimen collection/handling, submission of specimen other than nasopharyngeal swab, presence of viral mutation(s) within the areas targeted by this assay, and inadequate number of viral copies(<138 copies/mL). A negative result must be combined with clinical observations, patient history, and epidemiological information. The expected result is Negative.  Fact Sheet for Patients:  EntrepreneurPulse.com.au  Fact Sheet for Healthcare Providers:  IncredibleEmployment.be  This test is no t yet approved or cleared by the [place] FDA and  has been authorized for detection and/or diagnosis of SARS-CoV-2 by FDA under an Emergency Use Authorization (EUA). This EUA will remain  in effect (meaning this test can be used) for the duration of the COVID-19 declaration under Section 564(b)(1) of the Act, 21 U.S.C.section 360bbb-3(b)(1), unless the authorization is terminated  or revoked sooner.       Influenza A by PCR NEGATIVE NEGATIVE Final   Influenza B by PCR NEGATIVE NEGATIVE Final    Comment: (NOTE) The Xpert Xpress SARS-CoV-2/FLU/RSV plus assay is intended as an aid in the diagnosis of influenza from Nasopharyngeal swab specimens and should not be used as a sole basis for treatment. Nasal washings and aspirates are unacceptable for Xpert Xpress SARS-CoV-2/FLU/RSV testing.  Fact Sheet for Patients: EntrepreneurPulse.com.au  Fact Sheet for Healthcare Providers: IncredibleEmployment.be  This test is not yet approved or cleared by the [place] FDA and has been  authorized for detection and/or diagnosis of SARS-CoV-2 by FDA under an Emergency Use Authorization (EUA). This EUA will remain in effect (meaning this test can be used) for the duration of the COVID-19 declaration under Section 564(b)(1) of the Act, 21 U.S.C. section 360bbb-3(b)(1), unless the authorization is terminated or revoked.  Performed at [facility], [address] [place], [place] [address]   Culture, blood (routine x 2)     Status: None (Preliminary result)   Collection Time: 09/26/20 11:07 PM   Specimen: BLOOD  Result Value Ref Range Status   Specimen Description BLOOD RIGHT ANTECUBITAL  Final   Special Requests   Final    BOTTLES DRAWN AEROBIC AND ANAEROBIC Blood Culture results may not be optimal due to an inadequate volume of blood received in culture bottles   Culture   Final    NO GROWTH 2 DAYS Performed at [facility], [address], [place], [place] [address]    Report Status PENDING  Incomplete  Culture, blood (routine x 2)     Status: None (Preliminary result)   Collection Time: 09/26/20 11:07 PM   Specimen: BLOOD  Result Value Ref Range Status   Specimen Description BLOOD BLOOD LEFT FOREARM  Final   Special Requests   Final    BOTTLES DRAWN AEROBIC AND ANAEROBIC Blood Culture adequate volume   Culture   Final    NO GROWTH 2 DAYS Performed at [facility], [address], [place], [place] [address]    Report Status PENDING  Incomplete         Radiology Studies: DG Foot Complete Left  Result Date: 09/26/2020 CLINICAL DATA:  Foot swelling concern for infection EXAM: LEFT FOOT - COMPLETE 3+ VIEW COMPARISON:  Ankle radiograph February 06, 2019 FINDINGS: There is no evidence of fracture or dislocation. Calcaneal enthesophytes. Diffuse soft tissue swelling. IMPRESSION: Diffuse soft tissue swelling.  No acute osseous abnormality. Electronically Signed   By: Dahlia Bailiff MD   On: 09/26/2020 19:33        Scheduled  Meds: . amiodarone  200 mg Oral Daily  .  apixaban  5 mg Oral BID  . carvedilol  6.25 mg Oral BID WC  . clonazePAM  1 mg Oral QHS  . docusate sodium  100 mg Oral q morning  . docusate sodium  200 mg Oral QHS  . empagliflozin  10 mg Oral QPM  . glipiZIDE  10 mg Oral BID  . insulin aspart  0-5 Units Subcutaneous QHS  . insulin aspart  0-9 Units Subcutaneous TID WC  . insulin glargine  10 Units Subcutaneous Daily  . levothyroxine  50 mcg Oral Q0600  . losartan  12.5 mg Oral Daily  . magnesium oxide  400 mg Oral Daily  . metolazone  2.5 mg Oral Q M,W,F  . potassium chloride SA  40 mEq Oral Daily  . psyllium  1 packet Oral Daily  . rosuvastatin  20 mg Oral QPM  . torsemide  40 mg Oral Daily   Continuous Infusions: . sodium chloride 100 mL/hr at 09/28/20 1334  . ceFEPime (MAXIPIME) IV 2 g (09/28/20 0907)  . vancomycin Stopped (09/27/20 2128)     LOS: 2 days   Time spent: 55mins Greater than 50% of this time was spent in counseling, explanation of diagnosis, planning of further management, and coordination of care.   Voice Recognition Viviann Spare dictation system was used to create this note, attempts have been made to correct errors. Please contact the author with questions and/or clarifications.   Florencia Reasons, MD PhD FACP [facility]  Available via Epic secure chat 7am-7pm for nonurgent issues Please page for urgent issues To page the attending provider between 7A-7P or the covering provider during after hours 7P-7A, please log into the web site [email or web address] and access using universal  password for that web site. If you do not have the password, please call the hospital operator.    09/28/2020, 6:06 PM

## 2020-09-29 ENCOUNTER — Inpatient Hospital Stay: Payer: Medicare Other

## 2020-09-29 LAB — BASIC METABOLIC PANEL
Anion gap: 12 (ref 5–15)
BUN: 51 mg/dL — ABNORMAL HIGH (ref 8–23)
CO2: 29 mmol/L (ref 22–32)
Calcium: 8.8 mg/dL — ABNORMAL LOW (ref 8.9–10.3)
Chloride: 96 mmol/L — ABNORMAL LOW (ref 98–111)
Creatinine, Ser: 1.97 mg/dL — ABNORMAL HIGH (ref 0.61–1.24)
GFR, Estimated: 36 mL/min — ABNORMAL LOW (ref >60)
Glucose, Bld: 156 mg/dL — ABNORMAL HIGH (ref 70–99)
Potassium: 3.1 mmol/L — ABNORMAL LOW (ref 3.5–5.1)
Sodium: 137 mmol/L (ref 135–145)

## 2020-09-29 LAB — GLUCOSE, CAPILLARY
Glucose-Capillary: 96 mg/dL (ref 70–99)
Glucose-Capillary: 209 mg/dL — ABNORMAL HIGH (ref 70–99)
Glucose-Capillary: 88 mg/dL (ref 70–99)
Glucose-Capillary: 136 mg/dL — ABNORMAL HIGH (ref 70–99)

## 2020-09-29 LAB — CBC WITH DIFFERENTIAL/PLATELET
Abs Immature Granulocytes: 0.02 10*3/uL (ref 0.00–0.07)
Basophils Absolute: 0 10*3/uL (ref 0.0–0.1)
Basophils Relative: 1 %
Eosinophils Absolute: 0.2 10*3/uL (ref 0.0–0.5)
Eosinophils Relative: 3 %
HCT: 35.6 % — ABNORMAL LOW (ref 39.0–52.0)
Hemoglobin: 11.7 g/dL — ABNORMAL LOW (ref 13.0–17.0)
Immature Granulocytes: 0 %
Lymphocytes Relative: 18 %
Lymphs Abs: 1.2 10*3/uL (ref 0.7–4.0)
MCH: 31.8 pg (ref 26.0–34.0)
MCHC: 32.9 g/dL (ref 30.0–36.0)
MCV: 96.7 fL (ref 80.0–100.0)
Monocytes Absolute: 1.1 10*3/uL — ABNORMAL HIGH (ref 0.1–1.0)
Monocytes Relative: 15 %
Neutro Abs: 4.3 10*3/uL (ref 1.7–7.7)
Neutrophils Relative %: 63 %
Platelets: 123 10*3/uL — ABNORMAL LOW (ref 150–400)
RBC: 3.68 MIL/uL — ABNORMAL LOW (ref 4.22–5.81)
RDW: 17 % — ABNORMAL HIGH (ref 11.5–15.5)
WBC: 6.9 10*3/uL (ref 4.0–10.5)
nRBC: 0 % (ref 0.0–0.2)

## 2020-09-29 LAB — VANCOMYCIN, PEAK: Vancomycin Pk: 58 ug/mL (ref 30–40)

## 2020-09-29 LAB — MAGNESIUM: Magnesium: 2.1 mg/dL (ref 1.7–2.4)

## 2020-09-29 LAB — LACTIC ACID, PLASMA: Lactic Acid, Venous: 1.4 mmol/L (ref 0.5–1.9)

## 2020-09-29 LAB — C-REACTIVE PROTEIN: CRP: 1.5 mg/dL — ABNORMAL HIGH (ref <1.0)

## 2020-09-29 LAB — SEDIMENTATION RATE: Sed Rate: 58 mm/hr — ABNORMAL HIGH (ref 0–20)

## 2020-09-29 MED ORDER — BISACODYL 10 MG RE SUPP
10.0000 mg | Freq: Once | RECTAL | Status: AC
  Administered 2020-09-29: 10 mg via RECTAL
  Filled 2020-09-29: qty 1

## 2020-09-29 MED ORDER — POTASSIUM CHLORIDE CRYS ER 20 MEQ PO TBCR
40.0000 meq | EXTENDED_RELEASE_TABLET | Freq: Once | ORAL | Status: AC
  Administered 2020-09-29: 40 meq via ORAL
  Filled 2020-09-29: qty 2

## 2020-09-29 MED ORDER — POLYETHYLENE GLYCOL 3350 17 G PO PACK
17.0000 g | PACK | Freq: Every day | ORAL | Status: DC
  Administered 2020-09-29: 17 g via ORAL
  Filled 2020-09-29: qty 1

## 2020-09-29 MED ORDER — SENNOSIDES-DOCUSATE SODIUM 8.6-50 MG PO TABS
1.0000 | ORAL_TABLET | Freq: Two times a day (BID) | ORAL | Status: DC
  Administered 2020-09-29 – 2020-10-03 (×7): 1 via ORAL
  Filled 2020-09-29 (×8): qty 1

## 2020-09-29 MED ORDER — SODIUM CHLORIDE 0.9 % IV SOLN: INTRAVENOUS | Status: DC

## 2020-09-29 NOTE — Progress Notes (Addendum)
  Subjective:  Patient ID: [name], male    DOB: August 25, 1950,  MRN: [id number]  Feeling well other than swelling and pain in his foot  Denies fevers chills nausea vomiting Objective:   Vitals:   09/29/20 0449 09/29/20 0811  BP: 107/63 139/76  Pulse: 70 82  Resp: 16 18  Temp: (!) 97.5 F (36.4 C) 98.5 F (36.9 C)  SpO2: 99% 98%   General AA&O x3. Normal mood and affect.  Vascular Dorsalis pedis and posterior tibial pulses nonpalpable significant edema  Neurologic Epicritic sensation grossly reduced.  Dermatologic  mottled erythematous left foot with without any signs clinically of abscess formation.  However discoloration redness seems to be getting worse.  No area of fluctuance or crepitus noted.  Orthopedic: MMT 5/5 in dorsiflexion, plantarflexion, inversion, and eversion. Normal joint ROM without pain or crepitus.            Assessment & Plan:  Patient was evaluated and treated and all questions answered.  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  No area of superficial abscess that I was able to drain. -Awaiting ABIs PVRs.  Patient may have a clinical limb ischemia. -We will follow closely  Felipa Furnace, DPM  Accessible via secure chat for questions or concerns.

## 2020-09-29 NOTE — Care Management Important Message (Signed)
Important Message  Patient Details  Name: [name] MRN: [id number] Date of Birth: 06-22-1950   Medicare Important Message Given:  Yes     Dannette Barbara 09/29/2020, 1:02 PM

## 2020-09-29 NOTE — Progress Notes (Signed)
PROGRESS NOTE    [name]  PNT:[id number] DOB: 1950-11-20 DOA: 09/26/2020 PCP: Vidal Schwalbe, MD    Chief Complaint  Patient presents with  . Foot Pain    Brief Narrative:  [name] is a 70 y.o. Caucasian male with medical history significant for coronary artery disease, status post PCI and stents, type 2 diabetes mellitus, stage IIIb chronic kidney disease, hypertension, ischemic cardiomyopathy, paroxysmal atrial fibrillation on Eliquis and sleep apnea, who presented to the emergency room with acute onset of worsening left foot swelling with associated pain and discoloration, warmth and tenderness after walking from a while shopping on Saturday.   Subjective:   Reports no bm since satruday Currently denies pain at rest, no fever,   Assessment & Plan:   Active Problems:   Cellulitis in diabetic foot (HCC)   Foot pain, left   Cellulitis   Diabetic foot infection, left -Blood culture no growth, foot x-ray did not show osteomyelitis -On IV Vanco and cefepime -ABI/MRI per podiatry  -will follow podiatry recommendation  Insulin-dependent type 2 diabetes, uncontrolled, A1c 7.4, with hyperglycemia -Adjust insulin as needed   Paroxysmal A. Fib Currently in sinus rhythm, continue Coreg, amiodarone, Eliquis  Chronic systolic heart failure Euvolemic Continue home medication Coreg, Jardiance, losartan -Home diuretic torsemide and metolazone    The patient's BMI is: Body mass index is 33.91 kg/m.[name]      Unresulted Labs (From admission, onward)          Start     Ordered   10/03/20 0500  Creatinine, serum  (enoxaparin (LOVENOX)    CrCl >/= 30 ml/min)  Weekly,   STAT     Comments: while on enoxaparin therapy    09/26/20 2021   09/29/20 0500  C-reactive protein  Tomorrow morning,   R       Question:  Specimen collection method  Answer:  Lab=Lab collect   09/28/20 1812            DVT prophylaxis:  apixaban (ELIQUIS) tablet 5 mg   Code  Status: Full Family Communication: Roommate at bedside with permission Disposition:   Status is: Inpatient   Dispo: The patient is from: Home              Anticipated d/c is to: Home              Anticipated d/c date is: To be determined, currently on IV antibiotics                Consultants:   Podiatry  Procedures:   None  Antimicrobials:   Anti-infectives (From admission, onward)   Start     Dose/Rate Route Frequency Ordered Stop   09/27/20 2000  vancomycin (VANCOCIN) IVPB 1000 mg/200 mL premix        1,000 mg 200 mL/hr over 60 Minutes Intravenous Every 24 hours 09/26/20 2040     09/26/20 2200  ceFEPIme (MAXIPIME) 2 g in sodium chloride 0.9 % 100 mL IVPB        2 g 200 mL/hr over 30 Minutes Intravenous Every 12 hours 09/26/20 2025     09/26/20 2030  vancomycin (VANCOCIN) IVPB 1000 mg/200 mL premix  Status:  Discontinued        1,000 mg 200 mL/hr over 60 Minutes Intravenous  Once 09/26/20 2021 09/26/20 2024   09/26/20 2030  ceFEPIme (MAXIPIME) 2 g in sodium chloride 0.9 % 100 mL IVPB  Status:  Discontinued  2 g 200 mL/hr over 30 Minutes Intravenous  Once 09/26/20 2021 09/26/20 2025   09/26/20 2030  vancomycin (VANCOREADY) IVPB 1500 mg/300 mL        1,500 mg 150 mL/hr over 120 Minutes Intravenous  Once 09/26/20 2023 09/27/20 0132   09/26/20 1845  vancomycin (VANCOCIN) IVPB 1000 mg/200 mL premix        1,000 mg 200 mL/hr over 60 Minutes Intravenous  Once 09/26/20 1839 09/26/20 2000          Objective: Vitals:   09/28/20 1952 09/28/20 2337 09/29/20 0449 09/29/20 0811  BP: 109/64 126/77 107/63 139/76  Pulse: 74 74 70 82  Resp: 16 18 16 18   Temp: 98.1 F (36.7 C) (!) 97.4 F (36.3 C) (!) 97.5 F (36.4 C) 98.5 F (36.9 C)  TempSrc: Oral Oral Oral   SpO2: 97% 100% 99% 98%  Weight:      Height:        Intake/Output Summary (Last 24 hours) at 09/29/2020 0918 Last data filed at 09/29/2020 0500 Gross per 24 hour  Intake 500 ml  Output 2800 ml  Net  -2300 ml   Filed Weights   09/26/20 1714  Weight: 113.4 kg    Examination:  General exam: calm, NAD Respiratory system: Clear to auscultation. Respiratory effort normal. Cardiovascular system: S1 & S2 heard, RRR. No JVD, no murmur, No pedal edema. Gastrointestinal system: Abdomen is nondistended, soft and nontender. Normal bowel sounds heard. Central nervous system: Alert and oriented. No focal neurological deficits. Extremities: left foot cellulitis, chronic venous stasis skin changes, no leg edema Skin: No rashes, lesions or ulcers Psychiatry: Judgement and insight appear normal. Mood & affect appropriate.     Data Reviewed: I have personally reviewed following labs and imaging studies  CBC: Recent Labs  Lab 09/26/20 1717 09/27/20 0600 09/28/20 0534 09/29/20 0433  WBC 7.0 8.1 7.7 6.9  NEUTROABS 4.6  --   --  4.3  HGB 12.0* 13.2 12.9* 11.7*  HCT 36.6* 39.8 39.3 35.6*  MCV 96.6 98.0 96.6 96.7  PLT 147* 142* 136* 123*    Basic Metabolic Panel: Recent Labs  Lab 09/26/20 1717 09/27/20 0600 09/28/20 0534 09/29/20 0433  NA 135 138 139 137  K 3.9 3.5 4.2 3.1*  CL 94* 94* 96* 96*  CO2 27 31 31 29   GLUCOSE 171* 139* 125* 156*  BUN 38* 36* 45* 51*  CREATININE 1.97* 1.76* 2.04* 1.97*  CALCIUM 9.1 9.3 9.1 8.8*  MG  --   --  2.3 2.1    GFR: Estimated Creatinine Clearance: 46.6 mL/min (A) (by C-G formula based on SCr of 1.97 mg/dL (H)).  Liver Function Tests: Recent Labs  Lab 09/26/20 1717  AST 23  ALT 20  ALKPHOS 86  BILITOT 1.0  PROT 7.8  ALBUMIN 3.9    CBG: Recent Labs  Lab 09/28/20 0748 09/28/20 1119 09/28/20 1619 09/28/20 2059 09/29/20 0821  GLUCAP 103* 199* 109* 140* 96     Recent Results (from the past 240 hour(s))  Resp Panel by RT-PCR (Flu A&B, Covid) Nasopharyngeal Swab     Status: None   Collection Time: 09/26/20  8:02 PM   Specimen: Nasopharyngeal Swab; Nasopharyngeal(NP) swabs in vial transport medium  Result Value Ref Range Status    SARS Coronavirus 2 by RT PCR NEGATIVE NEGATIVE Final    Comment: (NOTE) SARS-CoV-2 target nucleic acids are NOT DETECTED.  The SARS-CoV-2 RNA is generally detectable in upper respiratory specimens during the acute phase of infection.  The lowest concentration of SARS-CoV-2 viral copies this assay can detect is 138 copies/mL. A negative result does not preclude SARS-Cov-2 infection and should not be used as the sole basis for treatment or other patient management decisions. A negative result may occur with  improper specimen collection/handling, submission of specimen other than nasopharyngeal swab, presence of viral mutation(s) within the areas targeted by this assay, and inadequate number of viral copies(<138 copies/mL). A negative result must be combined with clinical observations, patient history, and epidemiological information. The expected result is Negative.  Fact Sheet for Patients:  EntrepreneurPulse.com.au  Fact Sheet for Healthcare Providers:  IncredibleEmployment.be  This test is no t yet approved or cleared by the [place] FDA and  has been authorized for detection and/or diagnosis of SARS-CoV-2 by FDA under an Emergency Use Authorization (EUA). This EUA will remain  in effect (meaning this test can be used) for the duration of the COVID-19 declaration under Section 564(b)(1) of the Act, 21 U.S.C.section 360bbb-3(b)(1), unless the authorization is terminated  or revoked sooner.       Influenza A by PCR NEGATIVE NEGATIVE Final   Influenza B by PCR NEGATIVE NEGATIVE Final    Comment: (NOTE) The Xpert Xpress SARS-CoV-2/FLU/RSV plus assay is intended as an aid in the diagnosis of influenza from Nasopharyngeal swab specimens and should not be used as a sole basis for treatment. Nasal washings and aspirates are unacceptable for Xpert Xpress SARS-CoV-2/FLU/RSV testing.  Fact Sheet for  Patients: EntrepreneurPulse.com.au  Fact Sheet for Healthcare Providers: IncredibleEmployment.be  This test is not yet approved or cleared by the [place] FDA and has been authorized for detection and/or diagnosis of SARS-CoV-2 by FDA under an Emergency Use Authorization (EUA). This EUA will remain in effect (meaning this test can be used) for the duration of the COVID-19 declaration under Section 564(b)(1) of the Act, 21 U.S.C. section 360bbb-3(b)(1), unless the authorization is terminated or revoked.  Performed at [facility], [address] [place], [place] [address]   Culture, blood (routine x 2)     Status: None (Preliminary result)   Collection Time: 09/26/20 11:07 PM   Specimen: BLOOD  Result Value Ref Range Status   Specimen Description BLOOD RIGHT ANTECUBITAL  Final   Special Requests   Final    BOTTLES DRAWN AEROBIC AND ANAEROBIC Blood Culture results may not be optimal due to an inadequate volume of blood received in culture bottles   Culture   Final    NO GROWTH 2 DAYS Performed at [facility], [address] [place], [place] [address]    Report Status PENDING  Incomplete  Culture, blood (routine x 2)     Status: None (Preliminary result)   Collection Time: 09/26/20 11:07 PM   Specimen: BLOOD  Result Value Ref Range Status   Specimen Description BLOOD BLOOD LEFT FOREARM  Final   Special Requests   Final    BOTTLES DRAWN AEROBIC AND ANAEROBIC Blood Culture adequate volume   Culture   Final    NO GROWTH 2 DAYS Performed at [facility], [address], [place], [place] [address]    Report Status PENDING  Incomplete         Radiology Studies: MR FOOT LEFT WO CONTRAST  Result Date: 09/29/2020 CLINICAL DATA:  Left foot pain and swelling.  Diabetic. EXAM: MRI OF THE LEFT FOOT WITHOUT CONTRAST TECHNIQUE: Multiplanar, multisequence MR imaging of the foot left foot was performed. No  intravenous contrast was administered. COMPARISON:  Radiographs 09/26/2020 FINDINGS: Diffuse subcutaneous soft tissue swelling/edema/fluid most notably along the dorsum of the forefoot and midfoot consistent with cellulitis. No obvious open wound. There are several small fluid collections likely representing micro abscesses. No discrete drainable abscess is identified. Diffuse myofasciitis without definite findings for pyomyositis. No MR findings to suggest septic arthritis or osteomyelitis. The major tendons and ligaments are intact. IMPRESSION: 1. Diffuse cellulitis and myofasciitis. 2. Small fluid collections in the dorsal subcutaneous tissues likely microabscesses but no discrete drainable abscess. 3. No MR findings to suggest septic arthritis or osteomyelitis. Electronically Signed   By: Marijo Sanes M.D.   On: 09/29/2020 07:50   US ARTERIAL ABI (SCREENING LOWER EXTREMITY)  Result Date: 09/29/2020 CLINICAL DATA:  Vascular ulcers, bilateral lower extremity skin color changes. Hypertension, diabetes, history of tobacco abuse. EXAM: NONINVASIVE PHYSIOLOGIC VASCULAR STUDY OF BILATERAL LOWER EXTREMITIES TECHNIQUE: Evaluation of both lower extremities were performed at rest, including calculation of ankle-brachial indices with single level Doppler, pressure and pulse volume recording. COMPARISON:  None. FINDINGS: Right ABI:  1.14 Left ABI:  1.13 Right Lower Extremity:  Normal arterial waveforms at the ankle. Left Lower Extremity:  Normal arterial waveforms at the ankle. IMPRESSION: No evidence of hemodynamically significant lower extremity arterial occlusive disease at rest. Electronically Signed   By: Lucrezia Europe M.D.   On: 09/29/2020 08:12        Scheduled Meds: . amiodarone  200 mg Oral Daily  . apixaban  5 mg Oral BID  . bisacodyl  10 mg Rectal Once  . carvedilol  6.25 mg Oral BID WC  . clonazePAM  1 mg Oral QHS  . empagliflozin  10 mg Oral QPM  . glipiZIDE  10 mg Oral BID  . insulin aspart   0-5 Units Subcutaneous QHS  . insulin aspart  0-9 Units Subcutaneous TID WC  . insulin glargine  10 Units Subcutaneous Daily  . levothyroxine  50 mcg Oral Q0600  . losartan  12.5 mg Oral Daily  . magnesium oxide  400 mg Oral Daily  . metolazone  2.5 mg Oral Q M,W,F  . polyethylene glycol  17 g Oral Daily  . potassium chloride SA  40 mEq Oral Daily  . potassium chloride  40 mEq Oral Once  . psyllium  1 packet Oral Daily  . rosuvastatin  20 mg Oral QPM  . senna-docusate  1 tablet Oral BID  . torsemide  40 mg Oral Daily   Continuous Infusions: . ceFEPime (MAXIPIME) IV 2 g (09/28/20 2106)  . vancomycin 1,000 mg (09/28/20 2001)     LOS: 3 days   Time spent: 60mins Greater than 50% of this time was spent in counseling, explanation of diagnosis, planning of further management, and coordination of care.   Voice Recognition Viviann Spare dictation system was used to create this note, attempts have been made to correct errors. Please contact the author with questions and/or clarifications.   Florencia Reasons, MD PhD FACP [facility]  Available via Epic secure chat 7am-7pm for nonurgent issues Please page for urgent issues To page the attending provider between 7A-7P or the covering provider during after hours 7P-7A, please log into the web site [email or web address] and access using universal Friendly password for that web site. If you do not have the password, please call the hospital operator.    09/29/2020, 9:18 AM

## 2020-09-30 ENCOUNTER — Encounter: Payer: Self-pay | Admitting: Family Medicine

## 2020-09-30 ENCOUNTER — Inpatient Hospital Stay: Payer: Medicare Other | Admitting: Anesthesiology

## 2020-09-30 ENCOUNTER — Ambulatory Visit: Payer: Medicare Other | Admitting: Internal Medicine

## 2020-09-30 ENCOUNTER — Encounter
Admission: EM | Disposition: A | Discharge status: Home / Self Care | Payer: Self-pay | Source: Home / Self Care | Attending: Internal Medicine

## 2020-09-30 DIAGNOSIS — L0390 Cellulitis, unspecified: Secondary | ICD-10-CM

## 2020-09-30 DIAGNOSIS — S9032XA Contusion of left foot, initial encounter: Secondary | ICD-10-CM

## 2020-09-30 HISTORY — PX: IRRIGATION AND DEBRIDEMENT FOOT: SHX6602

## 2020-09-30 LAB — BASIC METABOLIC PANEL
Anion gap: 12 (ref 5–15)
BUN: 60 mg/dL — ABNORMAL HIGH (ref 8–23)
CO2: 29 mmol/L (ref 22–32)
Calcium: 8.9 mg/dL (ref 8.9–10.3)
Chloride: 97 mmol/L — ABNORMAL LOW (ref 98–111)
Creatinine, Ser: 1.99 mg/dL — ABNORMAL HIGH (ref 0.61–1.24)
GFR, Estimated: 36 mL/min — ABNORMAL LOW (ref >60)
Glucose, Bld: 146 mg/dL — ABNORMAL HIGH (ref 70–99)
Potassium: 3.3 mmol/L — ABNORMAL LOW (ref 3.5–5.1)
Sodium: 138 mmol/L (ref 135–145)

## 2020-09-30 LAB — CBC WITH DIFFERENTIAL/PLATELET
Abs Immature Granulocytes: 0.02 10*3/uL (ref 0.00–0.07)
Basophils Absolute: 0.1 10*3/uL (ref 0.0–0.1)
Basophils Relative: 1 %
Eosinophils Absolute: 0.3 10*3/uL (ref 0.0–0.5)
Eosinophils Relative: 5 %
HCT: 36.3 % — ABNORMAL LOW (ref 39.0–52.0)
Hemoglobin: 12.3 g/dL — ABNORMAL LOW (ref 13.0–17.0)
Immature Granulocytes: 0 %
Lymphocytes Relative: 19 %
Lymphs Abs: 1.3 10*3/uL (ref 0.7–4.0)
MCH: 32.8 pg (ref 26.0–34.0)
MCHC: 33.9 g/dL (ref 30.0–36.0)
MCV: 96.8 fL (ref 80.0–100.0)
Monocytes Absolute: 1.2 10*3/uL — ABNORMAL HIGH (ref 0.1–1.0)
Monocytes Relative: 18 %
Neutro Abs: 3.7 10*3/uL (ref 1.7–7.7)
Neutrophils Relative %: 57 %
Platelets: 125 10*3/uL — ABNORMAL LOW (ref 150–400)
RBC: 3.75 MIL/uL — ABNORMAL LOW (ref 4.22–5.81)
RDW: 16.8 % — ABNORMAL HIGH (ref 11.5–15.5)
WBC: 6.5 10*3/uL (ref 4.0–10.5)
nRBC: 0 % (ref 0.0–0.2)

## 2020-09-30 LAB — GLUCOSE, CAPILLARY
Glucose-Capillary: 97 mg/dL (ref 70–99)
Glucose-Capillary: 120 mg/dL — ABNORMAL HIGH (ref 70–99)
Glucose-Capillary: 94 mg/dL (ref 70–99)
Glucose-Capillary: 181 mg/dL — ABNORMAL HIGH (ref 70–99)
Glucose-Capillary: 165 mg/dL — ABNORMAL HIGH (ref 70–99)

## 2020-09-30 SURGERY — IRRIGATION AND DEBRIDEMENT FOOT
Anesthesia: General | Laterality: Left

## 2020-09-30 MED ORDER — LACTATED RINGERS IV SOLN: INTRAVENOUS | Status: DC | PRN

## 2020-09-30 MED ORDER — FENTANYL CITRATE (PF) 100 MCG/2ML IJ SOLN
25.0000 ug | INTRAMUSCULAR | Status: DC | PRN
Start: 2020-09-30 — End: 2020-09-30

## 2020-09-30 MED ORDER — PROPOFOL 1000 MG/100ML IV EMUL
INTRAVENOUS | Status: AC
  Filled 2020-09-30: qty 100

## 2020-09-30 MED ORDER — POLYETHYLENE GLYCOL 3350 17 G PO PACK
17.0000 g | PACK | Freq: Two times a day (BID) | ORAL | Status: DC
  Administered 2020-09-30 – 2020-10-03 (×3): 17 g via ORAL
  Filled 2020-09-30 (×6): qty 1

## 2020-09-30 MED ORDER — VANCOMYCIN HCL 1000 MG IV SOLR
INTRAVENOUS | Status: AC
  Filled 2020-09-30: qty 1000

## 2020-09-30 MED ORDER — MIDAZOLAM HCL 2 MG/2ML IJ SOLN
INTRAMUSCULAR | Status: AC
  Filled 2020-09-30: qty 2

## 2020-09-30 MED ORDER — ONDANSETRON HCL 4 MG/2ML IJ SOLN: 4.0000 mg | Freq: Once | INTRAMUSCULAR | Status: DC | PRN

## 2020-09-30 MED ORDER — FENTANYL CITRATE (PF) 100 MCG/2ML IJ SOLN
INTRAMUSCULAR | Status: AC
  Filled 2020-09-30: qty 2

## 2020-09-30 MED ORDER — MIDAZOLAM HCL 2 MG/2ML IJ SOLN
INTRAMUSCULAR | Status: DC | PRN
  Administered 2020-09-30: 2 mg via INTRAVENOUS

## 2020-09-30 MED ORDER — POTASSIUM CHLORIDE 10 MEQ/100ML IV SOLN
10.0000 meq | INTRAVENOUS | Status: DC
  Filled 2020-09-30 (×4): qty 100

## 2020-09-30 MED ORDER — SUCCINYLCHOLINE CHLORIDE 20 MG/ML IJ SOLN
INTRAMUSCULAR | Status: DC | PRN
  Administered 2020-09-30: 120 mg via INTRAVENOUS

## 2020-09-30 MED ORDER — BUPIVACAINE HCL 0.5 % IJ SOLN
INTRAMUSCULAR | Status: DC | PRN
  Administered 2020-09-30: 10 mL

## 2020-09-30 MED ORDER — BUPIVACAINE HCL (PF) 0.5 % IJ SOLN
INTRAMUSCULAR | Status: AC
  Filled 2020-09-30: qty 30

## 2020-09-30 MED ORDER — SUCCINYLCHOLINE CHLORIDE 200 MG/10ML IV SOSY
PREFILLED_SYRINGE | INTRAVENOUS | Status: AC
  Filled 2020-09-30: qty 10

## 2020-09-30 MED ORDER — PROPOFOL 10 MG/ML IV BOLUS
INTRAVENOUS | Status: DC | PRN
  Administered 2020-09-30: 120 mg via INTRAVENOUS

## 2020-09-30 MED ORDER — POTASSIUM CHLORIDE CRYS ER 20 MEQ PO TBCR
40.0000 meq | EXTENDED_RELEASE_TABLET | Freq: Once | ORAL | Status: AC
  Administered 2020-09-30: 40 meq via ORAL
  Filled 2020-09-30: qty 2

## 2020-09-30 MED ORDER — LIDOCAINE HCL (CARDIAC) PF 100 MG/5ML IV SOSY
PREFILLED_SYRINGE | INTRAVENOUS | Status: DC | PRN
  Administered 2020-09-30: 100 mg via INTRAVENOUS

## 2020-09-30 MED ORDER — ONDANSETRON HCL 4 MG/2ML IJ SOLN
INTRAMUSCULAR | Status: AC
  Filled 2020-09-30: qty 2

## 2020-09-30 MED ORDER — FENTANYL CITRATE (PF) 100 MCG/2ML IJ SOLN
INTRAMUSCULAR | Status: DC | PRN
  Administered 2020-09-30: 50 ug via INTRAVENOUS

## 2020-09-30 MED ORDER — PHENYLEPHRINE HCL (PRESSORS) 10 MG/ML IV SOLN
INTRAVENOUS | Status: DC | PRN
  Administered 2020-09-30: 200 ug via INTRAVENOUS
  Administered 2020-09-30: 100 ug via INTRAVENOUS
  Administered 2020-09-30: 200 ug via INTRAVENOUS
  Administered 2020-09-30: 100 ug via INTRAVENOUS

## 2020-09-30 MED ORDER — LINEZOLID 600 MG PO TABS
600.0000 mg | ORAL_TABLET | Freq: Two times a day (BID) | ORAL | Status: DC
  Administered 2020-09-30 – 2020-10-02 (×4): 600 mg via ORAL
  Filled 2020-09-30 (×5): qty 1

## 2020-09-30 MED ORDER — CYCLOBENZAPRINE HCL 10 MG PO TABS
5.0000 mg | ORAL_TABLET | Freq: Three times a day (TID) | ORAL | Status: DC | PRN
  Administered 2020-09-30 – 2020-10-04 (×5): 5 mg via ORAL
  Filled 2020-09-30 (×5): qty 1

## 2020-09-30 MED ORDER — PROPOFOL 10 MG/ML IV BOLUS
INTRAVENOUS | Status: AC
  Filled 2020-09-30: qty 20

## 2020-09-30 MED ORDER — LIDOCAINE HCL (PF) 2 % IJ SOLN
INTRAMUSCULAR | Status: AC
  Filled 2020-09-30: qty 5

## 2020-09-30 SURGICAL SUPPLY — 60 items
BLADE OSC/SAGITTAL MD 5.5X18 (BLADE) IMPLANT
BLADE OSCILLATING/SAGITTAL (BLADE)
BLADE SURG 15 STRL LF DISP TIS (BLADE) ×1 IMPLANT
BLADE SURG 15 STRL SS (BLADE) ×1
BLADE SW THK.38XMED LNG THN (BLADE) IMPLANT
BNDG COHESIVE 4X5 TAN STRL (GAUZE/BANDAGES/DRESSINGS) IMPLANT
BNDG COHESIVE 6X5 TAN STRL LF (GAUZE/BANDAGES/DRESSINGS) IMPLANT
BNDG CONFORM 2 STRL LF (GAUZE/BANDAGES/DRESSINGS) IMPLANT
BNDG CONFORM 3 STRL LF (GAUZE/BANDAGES/DRESSINGS) IMPLANT
BNDG ELASTIC 4X5.8 VLCR STR LF (GAUZE/BANDAGES/DRESSINGS) ×2 IMPLANT
BNDG ESMARK 4X12 TAN STRL LF (GAUZE/BANDAGES/DRESSINGS) IMPLANT
BNDG GAUZE 4.5X4.1 6PLY STRL (MISCELLANEOUS) IMPLANT
CANISTER SUCT 1200ML W/VALVE (MISCELLANEOUS) IMPLANT
CANISTER WOUND CARE 500ML ATS (WOUND CARE) IMPLANT
COVER WAND RF STERILE (DRAPES) ×2 IMPLANT
CUFF TOURN SGL QUICK 12 (TOURNIQUET CUFF) IMPLANT
CUFF TOURN SGL QUICK 18X4 (TOURNIQUET CUFF) IMPLANT
DRAPE FLUOR MINI C-ARM 54X84 (DRAPES) IMPLANT
DRAPE XRAY CASSETTE 23X24 (DRAPES) IMPLANT
DURAPREP 26ML APPLICATOR (WOUND CARE) ×2 IMPLANT
ELECT REM PT RETURN 9FT ADLT (ELECTROSURGICAL) ×2
ELECTRODE REM PT RTRN 9FT ADLT (ELECTROSURGICAL) ×1 IMPLANT
GAUZE PACKING 1/4 X5 YD (GAUZE/BANDAGES/DRESSINGS) IMPLANT
GAUZE PACKING IODOFORM 1X5 (PACKING) ×2 IMPLANT
GAUZE SPONGE 4X4 12PLY STRL (GAUZE/BANDAGES/DRESSINGS) ×4 IMPLANT
GAUZE XEROFORM 1X8 LF (GAUZE/BANDAGES/DRESSINGS) IMPLANT
GLOVE SURG ENC MOIS LTX SZ7 (GLOVE) ×4 IMPLANT
GOWN STRL REUS W/ TWL XL LVL3 (GOWN DISPOSABLE) ×1 IMPLANT
GOWN STRL REUS W/TWL MED LVL3 (GOWN DISPOSABLE) ×4 IMPLANT
GOWN STRL REUS W/TWL XL LVL3 (GOWN DISPOSABLE) ×1
HANDPIECE VERSAJET DEBRIDEMENT (MISCELLANEOUS) IMPLANT
IV NS 1000ML (IV SOLUTION) ×1
IV NS 1000ML BAXH (IV SOLUTION) ×1 IMPLANT
KIT DRSG VAC SLVR GRANUFM (MISCELLANEOUS) IMPLANT
KIT TURNOVER KIT A (KITS) ×2 IMPLANT
LABEL OR SOLS (LABEL) ×2 IMPLANT
MANIFOLD NEPTUNE II (INSTRUMENTS) ×2 IMPLANT
NEEDLE FILTER BLUNT 18X 1/2SAF (NEEDLE)
NEEDLE FILTER BLUNT 18X1 1/2 (NEEDLE) IMPLANT
NEEDLE HYPO 25X1 1.5 SAFETY (NEEDLE) ×2 IMPLANT
NS IRRIG 500ML POUR BTL (IV SOLUTION) ×2 IMPLANT
PACK EXTREMITY ARMC (MISCELLANEOUS) ×2 IMPLANT
PAD ABD DERMACEA PRESS 5X9 (GAUZE/BANDAGES/DRESSINGS) ×4 IMPLANT
PENCIL ELECTRO HAND CTR (MISCELLANEOUS) ×2 IMPLANT
PULSAVAC PLUS IRRIG FAN TIP (DISPOSABLE) ×2
RASP SM TEAR CROSS CUT (RASP) IMPLANT
SHIELD FULL FACE ANTIFOG 7M (MISCELLANEOUS) IMPLANT
STOCKINETTE IMPERVIOUS 9X36 MD (GAUZE/BANDAGES/DRESSINGS) ×2 IMPLANT
SUT ETHILON 2 0 FS 18 (SUTURE) IMPLANT
SUT ETHILON 4-0 (SUTURE)
SUT ETHILON 4-0 FS2 18XMFL BLK (SUTURE)
SUT VIC AB 3-0 SH 27 (SUTURE)
SUT VIC AB 3-0 SH 27X BRD (SUTURE) IMPLANT
SUT VIC AB 4-0 FS2 27 (SUTURE) IMPLANT
SUTURE ETHLN 4-0 FS2 18XMF BLK (SUTURE) IMPLANT
SWAB CULTURE AMIES ANAERIB BLU (MISCELLANEOUS) IMPLANT
SYR 10ML LL (SYRINGE) ×2 IMPLANT
SYR 3ML LL SCALE MARK (SYRINGE) IMPLANT
TIP FAN IRRIG PULSAVAC PLUS (DISPOSABLE) ×1 IMPLANT
UNDERPAD 30X36 HEAVY ABSORB (UNDERPADS AND DIAPERS) IMPLANT

## 2020-09-30 NOTE — Brief Op Note (Signed)
09/30/2020  1:52 PM  PATIENT:  [name] Sandlin  70 y.o. male  PRE-OPERATIVE DIAGNOSIS:  left foot abscess  POST-OPERATIVE DIAGNOSIS:  left foot hematoma  PROCEDURE:  Procedure(s): IRRIGATION AND DEBRIDEMENT FOOT (Left)  SURGEON:  Surgeon(s) and Role:    * Eola Waldrep, Stephan Minister, DPM - Primary    ASSISTANTS: none   ANESTHESIA:   general  EBL:  5 mL   BLOOD ADMINISTERED:none  DRAINS: none   LOCAL MEDICATIONS USED:  MARCAINE   10 cc SPECIMEN: culture  DISPOSITION OF SPECIMEN: Micro  COUNTS:  YES  TOURNIQUET:  * Missing tourniquet times found for documented tourniquets in log: [id number] *  DICTATION: .Note written in EPIC  PLAN OF CARE: Admit to inpatient   PATIENT DISPOSITION:  PACU - hemodynamically stable.   Delay start of Pharmacological VTE agent (>24hrs) due to surgical blood loss or risk of bleeding: Yes

## 2020-09-30 NOTE — Anesthesia Preprocedure Evaluation (Addendum)
Anesthesia Evaluation  Patient identified by MRN, date of birth, ID band Patient awake    Reviewed: Allergy & Precautions, H&P , NPO status , reviewed documented beta blocker date and time   Airway Mallampati: III  TM Distance: >3 FB Neck ROM: limited    Dental  (+) Chipped, Caps, Missing, Poor Dentition   Pulmonary sleep apnea and Continuous Positive Airway Pressure Ventilation ,  Counseled re: OSA/CPAP and GOT risk   Pulmonary exam normal        Cardiovascular hypertension, + CAD and +CHF  Normal cardiovascular exam+ dysrhythmias Atrial Fibrillation   Pulmonary hypertension   Neuro/Psych  Neuromuscular disease negative psych ROS   GI/Hepatic neg GERD  ,  Endo/Other  diabetes, Type 2  Renal/GU Renal disease     Musculoskeletal   Abdominal   Peds negative pediatric ROS (+)  Hematology   Anesthesia Other Findings Past Medical History: No date: CKD (chronic kidney disease) stage 3, GFR 30-59 ml/min No date: Coronary artery disease     Comment:  a. 1998 s/p ACS Multi-link BMS to the prox LAD ([place] b. 07/2015 Cath: LM nl, LAD patent stent,              LCX min irregs, OM1/2 min irregs, OM3 nl, RCA min irregs. No date: Diabetes mellitus type 2, uncontrolled ([place])     Comment:  a. A1c 11.0 in 07/2015. No date: HFimpEF (heart failure with improved ejection fraction) ([place])     Comment:  a. EF 25% by cath in 2013; b. Echo in 07/2015 showing EF              of 15-20%, moderate MR, moderate Pulm HTN, severely               dilated IVC; c. 11/2016 Echo: EF 55-60%, no rwma, Gr1 DD,               mildly dil LA, nl RV fxn. No date: History of kidney stones No date: Hypertension No date: Ischemic cardiomyopathy No date: Kidney stones     Comment:  Left No date: Paroxysmal atrial fibrillation ([place])     Comment:  a. Dx 07/2015; b. CHA2DS2VASc = 5-->eliquis. Rhythm               control w/  amiodarone. 09-21-15: Pollen allergies     Comment:  pt called and stated that he woke up and had some               drainage-pt states he did not see the color of the               drainage-pt denies running a fever and this only happened              once-pt instructed to call Dr Audree Bane office if he               starts running a fever or if the color of drainage               becomes yellow/green No date: Psoriasis No date: Pulmonary hypertension ([place]) No date: Renal disorder     Comment:  kidney stone No date: Sleep apnea Past Surgical History: 07/25/2015: CARDIAC CATHETERIZATION; N/A     Comment:  Procedure: Right/Left Heart Cath and Coronary               Angiography;  Surgeon: Wellington Hampshire, MD;  Location:               [facility];  Service: Cardiovascular;                Laterality: N/A; No date: CARDIAC CATHETERIZATION     Comment:  [place] No date: CARDIAC CATHETERIZATION     Comment:  [place], [place] No date: CORONARY ANGIOPLASTY WITH STENT PLACEMENT 09/07/2015: [place]; Left     Comment:  Procedure: CYSTOSCOPY WITH STENT PLACEMENT;  Surgeon:               Hollice Espy, MD;  Location: [facility];  Service:               Urology;  Laterality: Left; 10/25/2015: CYSTOSCOPY/URETEROSCOPY/HOLMIUM LASER/STENT PLACEMENT; Left     Comment:  Procedure: CYSTOSCOPY/URETEROSCOPY/HOLMIUM LASER/STENT               EXCHANGE;  Surgeon: Hollice Espy, MD;  Location: [facility]               ORS;  Service: Urology;  Laterality: Left; 05/11/2019: CYSTOSCOPY/URETEROSCOPY/HOLMIUM LASER/STENT PLACEMENT; Left     Comment:  Procedure: CYSTOSCOPY/URETEROSCOPY/HOLMIUM LASER/STENT               PLACEMENT;  Surgeon: Hollice Espy, MD;  Location: [facility]              ORS;  Service: Urology;  Laterality: Left; 04/02/2019: EXTRACORPOREAL SHOCK WAVE LITHOTRIPSY; Left     Comment:  Procedure: EXTRACORPOREAL SHOCK WAVE LITHOTRIPSY (ESWL);              Surgeon:  Hollice Espy, MD;  Location: [facility] ORS;                Service: Urology;  Laterality: Left; 03/05/2019: EXTRACORPOREAL SHOCK WAVE LITHOTRIPSY; Left     Comment:  Procedure: EXTRACORPOREAL SHOCK WAVE LITHOTRIPSY (ESWL);              Surgeon: Abbie Sons, MD;  Location: [facility] ORS;                Service: Urology;  Laterality: Left; No date: EYE SURGERY; Bilateral     Comment:  LASER FOR GLAUCOMA No date: KIDNEY SURGERY 11/2019: NEPHROSTOMY TUBE PLACEMENT ([place] HX); Left 09/07/2015: URETEROSCOPY WITH HOLMIUM LASER LITHOTRIPSY; Left     Comment:  Procedure: URETEROSCOPY WITH HOLMIUM LASER LITHOTRIPSY;               Surgeon: Hollice Espy, MD;  Location: [facility] OR[facility];                Service: Urology;  Laterality: Left; BMI    Body Mass Index: 36.78 kg/m     Reproductive/Obstetrics                             Anesthesia Physical  Anesthesia Plan  ASA: IV  Anesthesia Plan: General   Post-op Pain Management:    Induction: Intravenous  PONV Risk Score and Plan: Ondansetron and Treatment may vary due to age or medical condition  Airway Management Planned: Oral ETT  Additional Equipment:   Intra-op Plan:   Post-operative Plan: Extubation in OR  Informed Consent: I have reviewed the patients History and Physical, chart, labs and discussed the procedure including the risks, benefits and alternatives for the proposed anesthesia with the patient or authorized representative who has indicated his/her understanding and acceptance.  Dental Advisory Given  Plan Discussed with: CRNA  Anesthesia Plan Comments:        Anesthesia Quick Evaluation

## 2020-09-30 NOTE — Consult Note (Signed)
[facility] Vascular Consult Note  MRN : [id number]  [name] is a 70 y.o. (March 20, 1951) male who presents with chief complaint of  Chief Complaint  Patient presents with  . Foot Pain   History of Present Illness: [name] is a 70 y.o. Caucasian male with medical history significant for coronary artery disease, status post PCI and stents, type 2 diabetes mellitus, stage IIIb chronic kidney disease, hypertension, ischemic cardiomyopathy, paroxysmal atrial fibrillation on Eliquis and sleep apnea, who presented to the emergency room with acute onset of worsening left foot swelling with associated pain and discoloration, warmth and tenderness after walking from a while shopping on Saturday.  He noticed significant redness in his left fourth digit and he believes he cut his nails too short.  His blood glucose highest measured was 257 over the last few days that is high for him.  No fever or chills.  No nausea or vomiting or abdominal pain.  No chest pain or dyspnea or cough or wheezing.  ED Course:  Upon presentation to the emergency room, vital signs were within normal.  Labs revealed BMP with BUN of 38 and creatinine 1.97 compared to 87 and 2.03 on 09/16/2020.  Lactic acid was 2.2.  CBC showed mild anemia compared to 07/17/2020.  UA showed more than 500 glucose and was otherwise unremarkable.  Imaging:  Left foot x-ray showed diffuse soft tissue swelling with no acute osseous abnormality.  Patient was taken to the operating room by podiatry on September 30, 2020 and underwent evacuation of hematoma.  Vascular surgery was consulted by Dr. Erlinda Hong for possible endovascular intervention.  Current Facility-Administered Medications  Medication Dose Route Frequency Provider Last Rate Last Admin  . 0.9 %  sodium chloride infusion   Intravenous Continuous Kenji Mapel A, PA-C 75 mL/hr at 09/30/20 6962 Infusion Verify at 09/30/20 0653  . [MAR Hold] acetaminophen  (TYLENOL) tablet 650 mg  650 mg Oral Q6H PRN Mansy, Jan A, MD   650 mg at 09/28/20 0238   Or  . [MAR Hold] acetaminophen (TYLENOL) suppository 650 mg  650 mg Rectal Q6H PRN Mansy, Arvella Merles, MD      . Doug Sou Hold] amiodarone (PACERONE) tablet 200 mg  200 mg Oral Daily Mansy, Jan A, MD   200 mg at 09/29/20 0907  . [MAR Hold] apixaban (ELIQUIS) tablet 5 mg  5 mg Oral BID Mansy, Jan A, MD   5 mg at 09/29/20 2103  . [MAR Hold] carvedilol (COREG) tablet 6.25 mg  6.25 mg Oral BID WC Mansy, Jan A, MD   6.25 mg at 09/29/20 1835  . [MAR Hold] ceFEPIme (MAXIPIME) 2 g in sodium chloride 0.9 % 100 mL IVPB  2 g Intravenous Q12H Virl Cagey E, RPH 200 mL/hr at 09/30/20 1053 2 g at 09/30/20 1053  . [MAR Hold] clonazePAM (KLONOPIN) tablet 1 mg  1 mg Oral QHS Mansy, Jan A, MD   1 mg at 09/29/20 2103  . [MAR Hold] clonazePAM (KLONOPIN) tablet 1 mg  1 mg Oral Daily PRN Mansy, Arvella Merles, MD      . Doug Sou Hold] empagliflozin (JARDIANCE) tablet 10 mg  10 mg Oral QPM Mansy, Jan A, MD   10 mg at 09/29/20 1835  . fentaNYL (SUBLIMAZE) injection 25 mcg  25 mcg Intravenous Q5 min PRN Alvin Critchley, MD      . Doug Sou Hold] glipiZIDE (GLUCOTROL) tablet 10 mg  10 mg Oral BID Mansy, Arvella Merles, MD   10  mg at 09/29/20 2103  . [MAR Hold] insulin aspart (novoLOG) injection 0-5 Units  0-5 Units Subcutaneous QHS Max Sane, MD      . Doug Sou Hold] insulin aspart (novoLOG) injection 0-9 Units  0-9 Units Subcutaneous TID WC Max Sane, MD   3 Units at 09/29/20 1236  . [MAR Hold] insulin glargine (LANTUS) injection 10 Units  10 Units Subcutaneous Daily Florencia Reasons, MD   10 Units at 09/29/20 0912  . [MAR Hold] levothyroxine (SYNTHROID) tablet 50 mcg  50 mcg Oral Q0600 Mansy, Jan A, MD   50 mcg at 09/30/20 0515  . [MAR Hold] linezolid (ZYVOX) tablet 600 mg  600 mg Oral Q12H Florencia Reasons, MD      . Doug Sou Hold] losartan (COZAAR) tablet 12.5 mg  12.5 mg Oral Daily Mansy, Jan A, MD   12.5 mg at 09/29/20 0907  . [MAR Hold] magnesium hydroxide (MILK OF MAGNESIA)  suspension 30 mL  30 mL Oral Daily PRN Mansy, Jan A, MD      . Doug Sou Hold] magnesium oxide TABS 400 mg  400 mg Oral Daily Mansy, Jan A, MD   400 mg at 09/29/20 0908  . [MAR Hold] melatonin tablet 10 mg  10 mg Oral QHS PRN Mansy, Arvella Merles, MD      . Doug Sou Hold] metolazone (ZAROXOLYN) tablet 2.5 mg  2.5 mg Oral Q M,W,F Max Sane, MD   2.5 mg at 09/28/20 0901  . [MAR Hold] morphine 2 MG/ML injection 2 mg  2 mg Intravenous Q4H PRN Mansy, Arvella Merles, MD      . Doug Sou Hold] ondansetron [facility]) tablet 4 mg  4 mg Oral Q6H PRN Mansy, Arvella Merles, MD       Or  . Doug Sou Hold] ondansetron [facility]) injection 4 mg  4 mg Intravenous Q6H PRN Mansy, Jan A, MD   4 mg at 09/30/20 1335  . ondansetron (ZOFRAN) injection 4 mg  4 mg Intravenous Once PRN Alvin Critchley, MD      . Doug Sou Hold] polyethylene glycol (MIRALAX / GLYCOLAX) packet 17 g  17 g Oral Daily Florencia Reasons, MD   17 g at 09/29/20 1236  . [MAR Hold] potassium chloride SA (KLOR-CON) CR tablet 40 mEq  40 mEq Oral Daily Mansy, Jan A, MD   40 mEq at 09/29/20 0907  . [MAR Hold] psyllium (HYDROCIL/METAMUCIL) 1 packet  1 packet Oral Daily Mansy, Arvella Merles, MD   1 packet at 09/29/20 [phone]  . [MAR Hold] rosuvastatin (CRESTOR) tablet 20 mg  20 mg Oral QPM Mansy, Jan A, MD   20 mg at 09/29/20 1835  . [MAR Hold] senna-docusate (Senokot-S) tablet 1 tablet  1 tablet Oral BID Florencia Reasons, MD   1 tablet at 09/29/20 2103  . [MAR Hold] torsemide (DEMADEX) tablet 40 mg  40 mg Oral Daily Mansy, Jan A, MD   40 mg at 09/29/20 0909  . [MAR Hold] traZODone (DESYREL) tablet 25 mg  25 mg Oral QHS PRN Mansy, Arvella Merles, MD       Past Medical History:  Diagnosis Date  . CKD (chronic kidney disease) stage 3, GFR 30-59 ml/min (HCC)   . Coronary artery disease    a. 1998 s/p ACS Multi-link BMS to the prox LAD ([place], [place]); b. 07/2015 Cath: LM nl, LAD patent stent, LCX min irregs, OM1/2 min irregs, OM3 nl, RCA min irregs; c. 07/2020 Cath: LM nl, LAD 50p/m, 70d, D2 70, LCX 40p/35m, OM3 40, RCA 60d->Med Rx.  [name]  Diabetes mellitus type 2, uncontrolled ([place])    a. A1c 11.0 in 07/2015.  [name] HFrEF    a. EF 25% by cath in 2013; b. Echo in 07/2015 showing EF of 15-20%, moderate MR, moderate Pulm HTN, severely dilated IVC; c. 11/2016 Echo: EF 55-60%, no rwma, Gr1 DD, mildly dil LA, nl RV fxn. d. 12/2019 Echo: EF 40-45%, e. 07/2020 Echo: EF 20-25%, sev red RV fxn, mod dil LA, mild-mod MR.  [name] History of kidney stones   . Hypertension   . Ischemic cardiomyopathy   . Kidney stones    Left  . Paroxysmal atrial fibrillation ([place])    a. Dx 07/2015; b. CHA2DS2VASc = 5-->eliquis. Rhythm control w/ amiodarone.  . Pollen allergies 09-21-15   pt called and stated that he woke up and had some drainage-pt states he did not see the color of the drainage-pt denies running a fever and this only happened once-pt instructed to call Dr Audree Bane office if he starts running a fever or if the color of drainage becomes yellow/green  . Psoriasis   . Pulmonary hypertension ([place])   . Renal disorder    kidney stone  . Sleep apnea    Past Surgical History:  Procedure Laterality Date  . CARDIAC CATHETERIZATION N/A 07/25/2015   Procedure: Right/Left Heart Cath and Coronary Angiography;  Surgeon: Wellington Hampshire, MD;  Location: [facility];  Service: Cardiovascular;  Laterality: N/A;  . CARDIAC CATHETERIZATION     [place]  . CARDIAC CATHETERIZATION     [facility], [place]    . CYSTOSCOPY WITH STENT PLACEMENT Left 09/07/2015   Procedure: CYSTOSCOPY WITH STENT PLACEMENT;  Surgeon: Hollice Espy, MD;  Location: [facility];  Service: Urology;  Laterality: Left;  . CYSTOSCOPY/URETEROSCOPY/HOLMIUM LASER/STENT PLACEMENT Left 10/25/2015   Procedure: CYSTOSCOPY/URETEROSCOPY/HOLMIUM LASER/STENT EXCHANGE;  Surgeon: Hollice Espy, MD;  Location: [facility];  Service: Urology;  Laterality: Left;  . CYSTOSCOPY/URETEROSCOPY/HOLMIUM LASER/STENT PLACEMENT Left 05/11/2019   Procedure:  CYSTOSCOPY/URETEROSCOPY/HOLMIUM LASER/STENT PLACEMENT;  Surgeon: Hollice Espy, MD;  Location: [facility];  Service: Urology;  Laterality: Left;  . EXTRACORPOREAL SHOCK WAVE LITHOTRIPSY Left 04/02/2019   Procedure: EXTRACORPOREAL SHOCK WAVE LITHOTRIPSY (ESWL);  Surgeon: Hollice Espy, MD;  Location: [facility];  Service: Urology;  Laterality: Left;  . EXTRACORPOREAL SHOCK WAVE LITHOTRIPSY Left 03/05/2019   Procedure: EXTRACORPOREAL SHOCK WAVE LITHOTRIPSY (ESWL);  Surgeon: Abbie Sons, MD;  Location: [facility];  Service: Urology;  Laterality: Left;  . EYE SURGERY Bilateral    LASER FOR GLAUCOMA  . KIDNEY SURGERY    . NEPHROSTOMY TUBE PLACEMENT ([place] HX) Left 11/2019  . RIGHT/LEFT HEART CATH AND CORONARY ANGIOGRAPHY N/A 07/12/2020   Procedure: RIGHT/LEFT HEART CATH AND CORONARY ANGIOGRAPHY;  Surgeon: Nelva Bush, MD;  Location: [facility];  Service: Cardiovascular;  Laterality: N/A;  . ROBOT ASSISTED LAPAROSCOPIC NEPHRECTOMY Left 12/10/2019   Procedure: XI ROBOTIC ASSISTED LAPAROSCOPIC NEPHRECTOMY;  Surgeon: Hollice Espy, MD;  Location: [facility];  Service: Urology;  Laterality: Left;  . URETEROSCOPY WITH HOLMIUM LASER LITHOTRIPSY Left 09/07/2015   Procedure: URETEROSCOPY WITH HOLMIUM LASER LITHOTRIPSY;  Surgeon: Hollice Espy, MD;  Location: [facility];  Service: Urology;  Laterality: Left;   Social History Social History   Tobacco Use  . Smoking status: Never Smoker  . Smokeless tobacco: Never Used  Vaping Use  . Vaping Use: Never used  Substance Use Topics  . Alcohol use: Yes    Alcohol/week: 0.0 standard drinks    Comment:  occasionally  . Drug use: No    Types: Marijuana, Cocaine, Opium, LSD    Comment: Past, greater than 10 years ago   Family History Family History  Problem Relation Age of Onset  . Heart failure Father   . Heart attack Brother   . Kidney cancer Neg Hx   . Bladder Cancer Neg Hx   . Prostate cancer Neg Hx   Denies family history of peripheral  artery disease, venous disease or renal disease.  Allergies  Allergen Reactions  . Other Hives and Other (See Comments)    Blue cheese Blue cheese Blue cheese   REVIEW OF SYSTEMS (Negative unless checked)  Constitutional: [] Weight loss  [] Fever  [] Chills Cardiac: [] Chest pain   [] Chest pressure   [] Palpitations   [] Shortness of breath when laying flat   [] Shortness of breath at rest   [] Shortness of breath with exertion. Vascular:  [] Pain in legs with walking   [] Pain in legs at rest   [] Pain in legs when laying flat   [] Claudication   [x] Pain in feet when walking  [x] Pain in feet at rest  [x] Pain in feet when laying flat   [] History of DVT   [] Phlebitis   [] Swelling in legs   [] Varicose veins   [] Non-healing ulcers Pulmonary:   [] Uses home oxygen   [] Productive cough   [] Hemoptysis   [] Wheeze  [] COPD   [] Asthma Neurologic:  [] Dizziness  [] Blackouts   [] Seizures   [] History of stroke   [] History of TIA  [] Aphasia   [] Temporary blindness   [] Dysphagia   [] Weakness or numbness in arms   [] Weakness or numbness in legs Musculoskeletal:  [] Arthritis   [] Joint swelling   [] Joint pain   [] Low back pain Hematologic:  [] Easy bruising  [] Easy bleeding   [] Hypercoagulable state   [] Anemic  [] Hepatitis Gastrointestinal:  [] Blood in stool   [] Vomiting blood  [] Gastroesophageal reflux/heartburn   [] Difficulty swallowing. Genitourinary:  [] Chronic kidney disease   [] Difficult urination  [] Frequent urination  [] Burning with urination   [] Blood in urine Skin:  [] Rashes   [] Ulcers   [] Wounds Psychological:  [] History of anxiety   []  History of major depression.  Physical Examination  Vitals:   09/30/20 1133 09/30/20 1350 09/30/20 1400 09/30/20 1415  BP: 109/70 106/61 103/61 111/63  Pulse: 72 69 73 69  Resp: 20  15 11   Temp: (!) 96.8 F (36 C) 97.6 F (36.4 C)    TempSrc: Temporal     SpO2: 97% 100% 94% 98%  Weight: 113.4 kg     Height: 6' (1.829 m)      Body mass index is 33.91 kg/m. Gen:   WD/WN, NAD Head: [place]/AT, No temporalis wasting. Prominent temp pulse not noted. Ear/Nose/Throat: Hearing grossly intact, nares w/o erythema or drainage, oropharynx w/o Erythema/Exudate Eyes: Sclera non-icteric, conjunctiva clear Neck: Trachea midline.  No JVD.  Pulmonary:  Good air movement, respirations not labored, equal bilaterally.  Cardiac: RRR, normal S1, S2. Vascular:  Vessel Right Left  Radial Palpable Palpable  Ulnar Palpable Palpable  Brachial Palpable Palpable  Carotid Palpable, without bruit Palpable, without bruit  Aorta Not palpable N/A  Femoral Palpable Palpable  Popliteal Palpable Palpable  PT Palpable Palpable  DP Palpable Palpable   Left lower extremity: Thigh soft.  Calf soft.  Extremities warm distally to toes.  There is severe venous stasis changes to mid calf distally.  There is erythema, bluish discoloration and edema to the foot.  Motor/sensory is intact.  Gastrointestinal: soft, non-tender/non-distended. No  guarding/reflex.  Musculoskeletal: M/S 5/5 throughout.  Extremities without ischemic changes.  No deformity or atrophy. No edema. Neurologic: Sensation grossly intact in extremities.  Symmetrical.  Speech is fluent. Motor exam as listed above. Psychiatric: Judgment intact, Mood & affect appropriate for pt's clinical situation. Dermatologic: No rashes or ulcers noted.  No cellulitis or open wounds. Lymph : No Cervical, Axillary, or Inguinal lymphadenopathy.    Document Information Photos    09/28/2020 17:38  Attached To:  Hospital Encounter on 09/26/20  Source Information Criselda Peaches, [place]  [facility]   CBC Lab Results  Component Value Date   WBC 6.5 09/30/2020   HGB 12.3 (L) 09/30/2020   HCT 36.3 (L) 09/30/2020   MCV 96.8 09/30/2020   PLT 125 (L) 09/30/2020   BMET    Component Value Date/Time   NA 138 09/30/2020 0504   NA 139 09/13/2020 1541   K 3.3 (L) 09/30/2020 0504   CL 97 (L) 09/30/2020 0504   CO2 29 09/30/2020 0504    GLUCOSE 146 (H) 09/30/2020 0504   BUN 60 (H) 09/30/2020 0504   BUN 110 (HH) 09/13/2020 1541   CREATININE 1.99 (H) 09/30/2020 0504   CALCIUM 8.9 09/30/2020 0504   GFRNONAA 36 (L) 09/30/2020 0504   GFRAA 36 (L) 07/08/2020 1525   Estimated Creatinine Clearance: 46.2 mL/min (A) (by C-G formula based on SCr of 1.99 mg/dL (H)).  COAG Lab Results  Component Value Date   INR 1.1 12/08/2019   INR 1.5 (H) 04/10/2019   INR 1.18 09/07/2015   Radiology MR FOOT LEFT WO CONTRAST  Result Date: 09/29/2020 CLINICAL DATA:  Left foot pain and swelling.  Diabetic. EXAM: MRI OF THE LEFT FOOT WITHOUT CONTRAST TECHNIQUE: Multiplanar, multisequence MR imaging of the foot left foot was performed. No intravenous contrast was administered. COMPARISON:  Radiographs 09/26/2020 FINDINGS: Diffuse subcutaneous soft tissue swelling/edema/fluid most notably along the dorsum of the forefoot and midfoot consistent with cellulitis. No obvious open wound. There are several small fluid collections likely representing micro abscesses. No discrete drainable abscess is identified. Diffuse myofasciitis without definite findings for pyomyositis. No MR findings to suggest septic arthritis or osteomyelitis. The major tendons and ligaments are intact. IMPRESSION: 1. Diffuse cellulitis and myofasciitis. 2. Small fluid collections in the dorsal subcutaneous tissues likely microabscesses but no discrete drainable abscess. 3. No MR findings to suggest septic arthritis or osteomyelitis. Electronically Signed   By: Marijo Sanes M.D.   On: 09/29/2020 07:50   US ARTERIAL ABI (SCREENING LOWER EXTREMITY)  Result Date: 09/29/2020 CLINICAL DATA:  Vascular ulcers, bilateral lower extremity skin color changes. Hypertension, diabetes, history of tobacco abuse. EXAM: NONINVASIVE PHYSIOLOGIC VASCULAR STUDY OF BILATERAL LOWER EXTREMITIES TECHNIQUE: Evaluation of both lower extremities were performed at rest, including calculation of ankle-brachial  indices with single level Doppler, pressure and pulse volume recording. COMPARISON:  None. FINDINGS: Right ABI:  1.14 Left ABI:  1.13 Right Lower Extremity:  Normal arterial waveforms at the ankle. Left Lower Extremity:  Normal arterial waveforms at the ankle. IMPRESSION: No evidence of hemodynamically significant lower extremity arterial occlusive disease at rest. Electronically Signed   By: Lucrezia Europe M.D.   On: 09/29/2020 08:12   DG Foot Complete Left  Result Date: 09/26/2020 CLINICAL DATA:  Foot swelling concern for infection EXAM: LEFT FOOT - COMPLETE 3+ VIEW COMPARISON:  Ankle radiograph February 06, 2019 FINDINGS: There is no evidence of fracture or dislocation. Calcaneal enthesophytes. Diffuse soft tissue swelling. IMPRESSION: Diffuse soft tissue swelling.  No acute  osseous abnormality. Electronically Signed   By: Dahlia Bailiff MD   On: 09/26/2020 19:33   Assessment/Plan The patient is a 69 year old male with multiple medical issues who presented with progressively worsening discomfort and cellulitis to the left foot recent evacuation of hematoma by podiatry - POD#0  1.  Possible atherosclerotic disease to the left lower extremity: Patient seen in consult yesterday afternoon.  Patient with multiple risk factors for atherosclerotic disease (hyperlipidemia, hypertension, type 2 diabetes) who presents with progressively worsening left foot pain as well as cellulitis and discoloration.  On physical exam patient clearly has some venous insufficiency as there is severe venous stasis changes to the bilateral legs.  In the setting of cellulitis with progressively worsening foot pain, multiple risk factors for atherosclerotic disease and what seems like the beginning of an ulceration on the dorsal aspect of the left foot would recommend the patient undergo a left lower extremity angiogram and attempt to assess his anatomy and contributing degree of peripheral artery disease.  If appropriate an attempt to  revascularize the leg can be made at that time.  Procedure, risks and benefits were explained to the patient.  All questions were answered.  Patient wishes to proceed.  For the patient is safe to undergo the procedure with deemed him to be off Eliquis for 1 day.  We will plan on angiogram Tuesday with Dr. Delana Meyer  2.  Diabetes: On appropriate medications however seems to be uncontrolled Encouraged good control as its slows the progression of atherosclerotic disease  3.  Hyperlipidemia: Currently the patient is on a statin for medical management ?  The addition of baby aspirin Encouraged good control as its slows the progression of atherosclerotic disease  4.  Chronic kidney disease: Patient is status post left nephrectomy Known history of chronic kidney disease stage III Patient does understand that the contrast to be used is metabolized by the kidney and if significant disease is found the angiogram may have to be split up into 2 different procedures to preserve his kidney function. Patient expresses his understanding  Discussed with Dr. Francene Castle, PA-C  09/30/2020 2:22 PM  This note was created with Dragon medical transcription system.  Any error is purely unintentional.

## 2020-09-30 NOTE — H&P (View-Only) (Signed)
[facility] Vascular Consult Note  MRN : [id number]  [name] is a 70 y.o. (May 16, 1951) male who presents with chief complaint of  Chief Complaint  Patient presents with  . Foot Pain   History of Present Illness: [name] is a 70 y.o. Caucasian male with medical history significant for coronary artery disease, status post PCI and stents, type 2 diabetes mellitus, stage IIIb chronic kidney disease, hypertension, ischemic cardiomyopathy, paroxysmal atrial fibrillation on Eliquis and sleep apnea, who presented to the emergency room with acute onset of worsening left foot swelling with associated pain and discoloration, warmth and tenderness after walking from a while shopping on Saturday.  He noticed significant redness in his left fourth digit and he believes he cut his nails too short.  His blood glucose highest measured was 257 over the last few days that is high for him.  No fever or chills.  No nausea or vomiting or abdominal pain.  No chest pain or dyspnea or cough or wheezing.  ED Course:  Upon presentation to the emergency room, vital signs were within normal.  Labs revealed BMP with BUN of 38 and creatinine 1.97 compared to 87 and 2.03 on 09/16/2020.  Lactic acid was 2.2.  CBC showed mild anemia compared to 07/17/2020.  UA showed more than 500 glucose and was otherwise unremarkable.  Imaging:  Left foot x-ray showed diffuse soft tissue swelling with no acute osseous abnormality.  Patient was taken to the operating room by podiatry on September 30, 2020 and underwent evacuation of hematoma.  Vascular surgery was consulted by Dr. Erlinda Hong for possible endovascular intervention.  Current Facility-Administered Medications  Medication Dose Route Frequency Provider Last Rate Last Admin  . 0.9 %  sodium chloride infusion   Intravenous Continuous Jacque Byron A, PA-C 75 mL/hr at 09/30/20 3419 Infusion Verify at 09/30/20 0653  . [MAR Hold] acetaminophen  (TYLENOL) tablet 650 mg  650 mg Oral Q6H PRN Mansy, Jan A, MD   650 mg at 09/28/20 0238   Or  . [MAR Hold] acetaminophen (TYLENOL) suppository 650 mg  650 mg Rectal Q6H PRN Mansy, Arvella Merles, MD      . Doug Sou Hold] amiodarone (PACERONE) tablet 200 mg  200 mg Oral Daily Mansy, Jan A, MD   200 mg at 09/29/20 0907  . [MAR Hold] apixaban (ELIQUIS) tablet 5 mg  5 mg Oral BID Mansy, Jan A, MD   5 mg at 09/29/20 2103  . [MAR Hold] carvedilol (COREG) tablet 6.25 mg  6.25 mg Oral BID WC Mansy, Jan A, MD   6.25 mg at 09/29/20 1835  . [MAR Hold] ceFEPIme (MAXIPIME) 2 g in sodium chloride 0.9 % 100 mL IVPB  2 g Intravenous Q12H Virl Cagey E, RPH 200 mL/hr at 09/30/20 1053 2 g at 09/30/20 1053  . [MAR Hold] clonazePAM (KLONOPIN) tablet 1 mg  1 mg Oral QHS Mansy, Jan A, MD   1 mg at 09/29/20 2103  . [MAR Hold] clonazePAM (KLONOPIN) tablet 1 mg  1 mg Oral Daily PRN Mansy, Arvella Merles, MD      . Doug Sou Hold] empagliflozin (JARDIANCE) tablet 10 mg  10 mg Oral QPM Mansy, Jan A, MD   10 mg at 09/29/20 1835  . fentaNYL (SUBLIMAZE) injection 25 mcg  25 mcg Intravenous Q5 min PRN Alvin Critchley, MD      . Doug Sou Hold] glipiZIDE (GLUCOTROL) tablet 10 mg  10 mg Oral BID Mansy, Arvella Merles, MD   10  mg at 09/29/20 2103  . [MAR Hold] insulin aspart (novoLOG) injection 0-5 Units  0-5 Units Subcutaneous QHS Max Sane, MD      . Doug Sou Hold] insulin aspart (novoLOG) injection 0-9 Units  0-9 Units Subcutaneous TID WC Max Sane, MD   3 Units at 09/29/20 1236  . [MAR Hold] insulin glargine (LANTUS) injection 10 Units  10 Units Subcutaneous Daily Florencia Reasons, MD   10 Units at 09/29/20 0912  . [MAR Hold] levothyroxine (SYNTHROID) tablet 50 mcg  50 mcg Oral Q0600 Mansy, Jan A, MD   50 mcg at 09/30/20 0515  . [MAR Hold] linezolid (ZYVOX) tablet 600 mg  600 mg Oral Q12H Florencia Reasons, MD      . Doug Sou Hold] losartan (COZAAR) tablet 12.5 mg  12.5 mg Oral Daily Mansy, Jan A, MD   12.5 mg at 09/29/20 0907  . [MAR Hold] magnesium hydroxide (MILK OF MAGNESIA)  suspension 30 mL  30 mL Oral Daily PRN Mansy, Jan A, MD      . Doug Sou Hold] magnesium oxide TABS 400 mg  400 mg Oral Daily Mansy, Jan A, MD   400 mg at 09/29/20 0908  . [MAR Hold] melatonin tablet 10 mg  10 mg Oral QHS PRN Mansy, Arvella Merles, MD      . Doug Sou Hold] metolazone (ZAROXOLYN) tablet 2.5 mg  2.5 mg Oral Q M,W,F Max Sane, MD   2.5 mg at 09/28/20 0901  . [MAR Hold] morphine 2 MG/ML injection 2 mg  2 mg Intravenous Q4H PRN Mansy, Arvella Merles, MD      . Doug Sou Hold] ondansetron [facility]) tablet 4 mg  4 mg Oral Q6H PRN Mansy, Arvella Merles, MD       Or  . Doug Sou Hold] ondansetron [facility]) injection 4 mg  4 mg Intravenous Q6H PRN Mansy, Jan A, MD   4 mg at 09/30/20 1335  . ondansetron (ZOFRAN) injection 4 mg  4 mg Intravenous Once PRN Alvin Critchley, MD      . Doug Sou Hold] polyethylene glycol (MIRALAX / GLYCOLAX) packet 17 g  17 g Oral Daily Florencia Reasons, MD   17 g at 09/29/20 1236  . [MAR Hold] potassium chloride SA (KLOR-CON) CR tablet 40 mEq  40 mEq Oral Daily Mansy, Jan A, MD   40 mEq at 09/29/20 0907  . [MAR Hold] psyllium (HYDROCIL/METAMUCIL) 1 packet  1 packet Oral Daily Mansy, Arvella Merles, MD   1 packet at 09/29/20 [phone]  . [MAR Hold] rosuvastatin (CRESTOR) tablet 20 mg  20 mg Oral QPM Mansy, Jan A, MD   20 mg at 09/29/20 1835  . [MAR Hold] senna-docusate (Senokot-S) tablet 1 tablet  1 tablet Oral BID Florencia Reasons, MD   1 tablet at 09/29/20 2103  . [MAR Hold] torsemide (DEMADEX) tablet 40 mg  40 mg Oral Daily Mansy, Jan A, MD   40 mg at 09/29/20 0909  . [MAR Hold] traZODone (DESYREL) tablet 25 mg  25 mg Oral QHS PRN Mansy, Arvella Merles, MD       Past Medical History:  Diagnosis Date  . CKD (chronic kidney disease) stage 3, GFR 30-59 ml/min (HCC)   . Coronary artery disease    a. 1998 s/p ACS Multi-link BMS to the prox LAD ([place], [place]); b. 07/2015 Cath: LM nl, LAD patent stent, LCX min irregs, OM1/2 min irregs, OM3 nl, RCA min irregs; c. 07/2020 Cath: LM nl, LAD 50p/m, 70d, D2 70, LCX 40p/43m, OM3 40, RCA 60d->Med Rx.  [name]  Diabetes mellitus type 2, uncontrolled ([place])    a. A1c 11.0 in 07/2015.  [name] HFrEF    a. EF 25% by cath in 2013; b. Echo in 07/2015 showing EF of 15-20%, moderate MR, moderate Pulm HTN, severely dilated IVC; c. 11/2016 Echo: EF 55-60%, no rwma, Gr1 DD, mildly dil LA, nl RV fxn. d. 12/2019 Echo: EF 40-45%, e. 07/2020 Echo: EF 20-25%, sev red RV fxn, mod dil LA, mild-mod MR.  [name] History of kidney stones   . Hypertension   . Ischemic cardiomyopathy   . Kidney stones    Left  . Paroxysmal atrial fibrillation ([place])    a. Dx 07/2015; b. CHA2DS2VASc = 5-->eliquis. Rhythm control w/ amiodarone.  . Pollen allergies 09-21-15   pt called and stated that he woke up and had some drainage-pt states he did not see the color of the drainage-pt denies running a fever and this only happened once-pt instructed to call Dr Audree Bane office if he starts running a fever or if the color of drainage becomes yellow/green  . Psoriasis   . Pulmonary hypertension ([place])   . Renal disorder    kidney stone  . Sleep apnea    Past Surgical History:  Procedure Laterality Date  . CARDIAC CATHETERIZATION N/A 07/25/2015   Procedure: Right/Left Heart Cath and Coronary Angiography;  Surgeon: Wellington Hampshire, MD;  Location: [facility];  Service: Cardiovascular;  Laterality: N/A;  . CARDIAC CATHETERIZATION     [place]  . CARDIAC CATHETERIZATION     [facility] [place]    . CYSTOSCOPY WITH STENT PLACEMENT Left 09/07/2015   Procedure: CYSTOSCOPY WITH STENT PLACEMENT;  Surgeon: Hollice Espy, MD;  Location: [facility];  Service: Urology;  Laterality: Left;  . CYSTOSCOPY/URETEROSCOPY/HOLMIUM LASER/STENT PLACEMENT Left 10/25/2015   Procedure: CYSTOSCOPY/URETEROSCOPY/HOLMIUM LASER/STENT EXCHANGE;  Surgeon: Hollice Espy, MD;  Location: [facility];  Service: Urology;  Laterality: Left;  . CYSTOSCOPY/URETEROSCOPY/HOLMIUM LASER/STENT PLACEMENT Left 05/11/2019   Procedure:  CYSTOSCOPY/URETEROSCOPY/HOLMIUM LASER/STENT PLACEMENT;  Surgeon: Hollice Espy, MD;  Location: [facility];  Service: Urology;  Laterality: Left;  . EXTRACORPOREAL SHOCK WAVE LITHOTRIPSY Left 04/02/2019   Procedure: EXTRACORPOREAL SHOCK WAVE LITHOTRIPSY (ESWL);  Surgeon: Hollice Espy, MD;  Location: [facility];  Service: Urology;  Laterality: Left;  . EXTRACORPOREAL SHOCK WAVE LITHOTRIPSY Left 03/05/2019   Procedure: EXTRACORPOREAL SHOCK WAVE LITHOTRIPSY (ESWL);  Surgeon: Abbie Sons, MD;  Location: [facility];  Service: Urology;  Laterality: Left;  . EYE SURGERY Bilateral    LASER FOR GLAUCOMA  . KIDNEY SURGERY    . NEPHROSTOMY TUBE PLACEMENT ([place] [facility]) Left 11/2019  . RIGHT/LEFT HEART CATH AND CORONARY ANGIOGRAPHY N/A 07/12/2020   Procedure: RIGHT/LEFT HEART CATH AND CORONARY ANGIOGRAPHY;  Surgeon: Nelva Bush, MD;  Location: [facility];  Service: Cardiovascular;  Laterality: N/A;  . ROBOT ASSISTED LAPAROSCOPIC NEPHRECTOMY Left 12/10/2019   Procedure: XI ROBOTIC ASSISTED LAPAROSCOPIC NEPHRECTOMY;  Surgeon: Hollice Espy, MD;  Location: [facility];  Service: Urology;  Laterality: Left;  . URETEROSCOPY WITH HOLMIUM LASER LITHOTRIPSY Left 09/07/2015   Procedure: URETEROSCOPY WITH HOLMIUM LASER LITHOTRIPSY;  Surgeon: Hollice Espy, MD;  Location: [facility];  Service: Urology;  Laterality: Left;   Social History Social History   Tobacco Use  . Smoking status: Never Smoker  . Smokeless tobacco: Never Used  Vaping Use  . Vaping Use: Never used  Substance Use Topics  . Alcohol use: Yes    Alcohol/week: 0.0 standard drinks    Comment:  occasionally  . Drug use: No    Types: Marijuana, Cocaine, Opium, LSD    Comment: Past, greater than 10 years ago   Family History Family History  Problem Relation Age of Onset  . Heart failure Father   . Heart attack Brother   . Kidney cancer Neg Hx   . Bladder Cancer Neg Hx   . Prostate cancer Neg Hx   Denies family history of peripheral  artery disease, venous disease or renal disease.  Allergies  Allergen Reactions  . Other Hives and Other (See Comments)    Blue cheese Blue cheese Blue cheese   REVIEW OF SYSTEMS (Negative unless checked)  Constitutional: [] Weight loss  [] Fever  [] Chills Cardiac: [] Chest pain   [] Chest pressure   [] Palpitations   [] Shortness of breath when laying flat   [] Shortness of breath at rest   [] Shortness of breath with exertion. Vascular:  [] Pain in legs with walking   [] Pain in legs at rest   [] Pain in legs when laying flat   [] Claudication   [x] Pain in feet when walking  [x] Pain in feet at rest  [x] Pain in feet when laying flat   [] History of DVT   [] Phlebitis   [] Swelling in legs   [] Varicose veins   [] Non-healing ulcers Pulmonary:   [] Uses home oxygen   [] Productive cough   [] Hemoptysis   [] Wheeze  [] COPD   [] Asthma Neurologic:  [] Dizziness  [] Blackouts   [] Seizures   [] History of stroke   [] History of TIA  [] Aphasia   [] Temporary blindness   [] Dysphagia   [] Weakness or numbness in arms   [] Weakness or numbness in legs Musculoskeletal:  [] Arthritis   [] Joint swelling   [] Joint pain   [] Low back pain Hematologic:  [] Easy bruising  [] Easy bleeding   [] Hypercoagulable state   [] Anemic  [] Hepatitis Gastrointestinal:  [] Blood in stool   [] Vomiting blood  [] Gastroesophageal reflux/heartburn   [] Difficulty swallowing. Genitourinary:  [] Chronic kidney disease   [] Difficult urination  [] Frequent urination  [] Burning with urination   [] Blood in urine Skin:  [] Rashes   [] Ulcers   [] Wounds Psychological:  [] History of anxiety   []  History of major depression.  Physical Examination  Vitals:   09/30/20 1133 09/30/20 1350 09/30/20 1400 09/30/20 1415  BP: 109/70 106/61 103/61 111/63  Pulse: 72 69 73 69  Resp: 20  15 11   Temp: (!) 96.8 F (36 C) 97.6 F (36.4 C)    TempSrc: Temporal     SpO2: 97% 100% 94% 98%  Weight: 113.4 kg     Height: 6' (1.829 m)      Body mass index is 33.91 kg/m. Gen:   WD/WN, NAD Head: [place]/AT, No temporalis wasting. Prominent temp pulse not noted. Ear/Nose/Throat: Hearing grossly intact, nares w/o erythema or drainage, oropharynx w/o Erythema/Exudate Eyes: Sclera non-icteric, conjunctiva clear Neck: Trachea midline.  No JVD.  Pulmonary:  Good air movement, respirations not labored, equal bilaterally.  Cardiac: RRR, normal S1, S2. Vascular:  Vessel Right Left  Radial Palpable Palpable  Ulnar Palpable Palpable  Brachial Palpable Palpable  Carotid Palpable, without bruit Palpable, without bruit  Aorta Not palpable N/A  Femoral Palpable Palpable  Popliteal Palpable Palpable  PT Palpable Palpable  DP Palpable Palpable   Left lower extremity: Thigh soft.  Calf soft.  Extremities warm distally to toes.  There is severe venous stasis changes to mid calf distally.  There is erythema, bluish discoloration and edema to the foot.  Motor/sensory is intact.  Gastrointestinal: soft, non-tender/non-distended. No  guarding/reflex.  Musculoskeletal: M/S 5/5 throughout.  Extremities without ischemic changes.  No deformity or atrophy. No edema. Neurologic: Sensation grossly intact in extremities.  Symmetrical.  Speech is fluent. Motor exam as listed above. Psychiatric: Judgment intact, Mood & affect appropriate for pt's clinical situation. Dermatologic: No rashes or ulcers noted.  No cellulitis or open wounds. Lymph : No Cervical, Axillary, or Inguinal lymphadenopathy.    Document Information Photos    09/28/2020 17:38  Attached To:  Hospital Encounter on 09/26/20  Source Information Criselda Peaches, [place]  [facility]   CBC Lab Results  Component Value Date   WBC 6.5 09/30/2020   HGB 12.3 (L) 09/30/2020   HCT 36.3 (L) 09/30/2020   MCV 96.8 09/30/2020   PLT 125 (L) 09/30/2020   BMET    Component Value Date/Time   NA 138 09/30/2020 0504   NA 139 09/13/2020 1541   K 3.3 (L) 09/30/2020 0504   CL 97 (L) 09/30/2020 0504   CO2 29 09/30/2020 0504    GLUCOSE 146 (H) 09/30/2020 0504   BUN 60 (H) 09/30/2020 0504   BUN 110 (HH) 09/13/2020 1541   CREATININE 1.99 (H) 09/30/2020 0504   CALCIUM 8.9 09/30/2020 0504   GFRNONAA 36 (L) 09/30/2020 0504   GFRAA 36 (L) 07/08/2020 1525   Estimated Creatinine Clearance: 46.2 mL/min (A) (by C-G formula based on SCr of 1.99 mg/dL (H)).  COAG Lab Results  Component Value Date   INR 1.1 12/08/2019   INR 1.5 (H) 04/10/2019   INR 1.18 09/07/2015   Radiology MR FOOT LEFT WO CONTRAST  Result Date: 09/29/2020 CLINICAL DATA:  Left foot pain and swelling.  Diabetic. EXAM: MRI OF THE LEFT FOOT WITHOUT CONTRAST TECHNIQUE: Multiplanar, multisequence MR imaging of the foot left foot was performed. No intravenous contrast was administered. COMPARISON:  Radiographs 09/26/2020 FINDINGS: Diffuse subcutaneous soft tissue swelling/edema/fluid most notably along the dorsum of the forefoot and midfoot consistent with cellulitis. No obvious open wound. There are several small fluid collections likely representing micro abscesses. No discrete drainable abscess is identified. Diffuse myofasciitis without definite findings for pyomyositis. No MR findings to suggest septic arthritis or osteomyelitis. The major tendons and ligaments are intact. IMPRESSION: 1. Diffuse cellulitis and myofasciitis. 2. Small fluid collections in the dorsal subcutaneous tissues likely microabscesses but no discrete drainable abscess. 3. No MR findings to suggest septic arthritis or osteomyelitis. Electronically Signed   By: Marijo Sanes M.D.   On: 09/29/2020 07:50   US ARTERIAL ABI (SCREENING LOWER EXTREMITY)  Result Date: 09/29/2020 CLINICAL DATA:  Vascular ulcers, bilateral lower extremity skin color changes. Hypertension, diabetes, history of tobacco abuse. EXAM: NONINVASIVE PHYSIOLOGIC VASCULAR STUDY OF BILATERAL LOWER EXTREMITIES TECHNIQUE: Evaluation of both lower extremities were performed at rest, including calculation of ankle-brachial  indices with single level Doppler, pressure and pulse volume recording. COMPARISON:  None. FINDINGS: Right ABI:  1.14 Left ABI:  1.13 Right Lower Extremity:  Normal arterial waveforms at the ankle. Left Lower Extremity:  Normal arterial waveforms at the ankle. IMPRESSION: No evidence of hemodynamically significant lower extremity arterial occlusive disease at rest. Electronically Signed   By: Lucrezia Europe M.D.   On: 09/29/2020 08:12   DG Foot Complete Left  Result Date: 09/26/2020 CLINICAL DATA:  Foot swelling concern for infection EXAM: LEFT FOOT - COMPLETE 3+ VIEW COMPARISON:  Ankle radiograph February 06, 2019 FINDINGS: There is no evidence of fracture or dislocation. Calcaneal enthesophytes. Diffuse soft tissue swelling. IMPRESSION: Diffuse soft tissue swelling.  No acute  osseous abnormality. Electronically Signed   By: Dahlia Bailiff MD   On: 09/26/2020 19:33   Assessment/Plan The patient is a 70 year old male with multiple medical issues who presented with progressively worsening discomfort and cellulitis to the left foot recent evacuation of hematoma by podiatry - POD#0  1.  Possible atherosclerotic disease to the left lower extremity: Patient seen in consult yesterday afternoon.  Patient with multiple risk factors for atherosclerotic disease (hyperlipidemia, hypertension, type 2 diabetes) who presents with progressively worsening left foot pain as well as cellulitis and discoloration.  On physical exam patient clearly has some venous insufficiency as there is severe venous stasis changes to the bilateral legs.  In the setting of cellulitis with progressively worsening foot pain, multiple risk factors for atherosclerotic disease and what seems like the beginning of an ulceration on the dorsal aspect of the left foot would recommend the patient undergo a left lower extremity angiogram and attempt to assess his anatomy and contributing degree of peripheral artery disease.  If appropriate an attempt to  revascularize the leg can be made at that time.  Procedure, risks and benefits were explained to the patient.  All questions were answered.  Patient wishes to proceed.  For the patient is safe to undergo the procedure with deemed him to be off Eliquis for 1 day.  We will plan on angiogram Tuesday with Dr. Delana Meyer  2.  Diabetes: On appropriate medications however seems to be uncontrolled Encouraged good control as its slows the progression of atherosclerotic disease  3.  Hyperlipidemia: Currently the patient is on a statin for medical management ?  The addition of baby aspirin Encouraged good control as its slows the progression of atherosclerotic disease  4.  Chronic kidney disease: Patient is status post left nephrectomy Known history of chronic kidney disease stage III Patient does understand that the contrast to be used is metabolized by the kidney and if significant disease is found the angiogram may have to be split up into 2 different procedures to preserve his kidney function. Patient expresses his understanding  Discussed with Dr. Francene Castle, PA-C  09/30/2020 2:22 PM  This note was created with Dragon medical transcription system.  Any error is purely unintentional.

## 2020-09-30 NOTE — Progress Notes (Signed)
Pt transported off floor to OR for I and D.

## 2020-09-30 NOTE — Transfer of Care (Signed)
Immediate Anesthesia Transfer of Care Note  Patient: Plato Alspaugh [place]  Procedure(s) Performed: IRRIGATION AND DEBRIDEMENT FOOT (Left )  Patient Location: PACU  Anesthesia Type:General  Level of Consciousness: awake, alert  and oriented  Airway & Oxygen Therapy: Patient Spontanous Breathing and Patient connected to face mask oxygen  Post-op Assessment: Report given to RN and Post -op Vital signs reviewed and stable  Post vital signs: Reviewed and stable  Last Vitals:  Vitals Value Taken Time  BP 106/61 09/30/20 1350  Temp    Pulse 71 09/30/20 1355  Resp 14 09/30/20 1355  SpO2 100 % 09/30/20 1355  Vitals shown include unvalidated device data.  Last Pain:  Vitals:   09/30/20 1133  TempSrc: Temporal  PainSc: 4       Patients Stated Pain Goal: 0 (93/11/21 6244)  Complications: No complications documented.

## 2020-09-30 NOTE — Progress Notes (Signed)
PROGRESS NOTE    [name] [facility]  PXT:[id number] DOB: 1951/03/16 DOA: 09/26/2020 PCP: Vidal Schwalbe, MD    Chief Complaint  Patient presents with  . Foot Pain    Brief Narrative:  [name] is a 70 y.o. Caucasian male with medical history significant for coronary artery disease, status post PCI and stents, type 2 diabetes mellitus, stage IIIb chronic kidney disease, hypertension, ischemic cardiomyopathy, paroxysmal atrial fibrillation on Eliquis and sleep apnea, who presented to the emergency room with acute onset of worsening left foot swelling with associated pain and discoloration, warmth and tenderness after walking from a while shopping on Saturday.   Subjective:  Late not entry Patient was seen earlier this am when he was awaiting for foot procedure Reports no bm since satruday Currently denies pain at rest, no fever,   Assessment & Plan:   Active Problems:   Cellulitis in diabetic foot (HCC)   Foot pain, left   Cellulitis   Hematoma of left foot   Diabetic foot infection vs ischemia, left -Blood culture no growth, foot x-ray did not show osteomyelitis -was on iV Vanco and cefepime, then linezolid and cefepime -ABI/MRI per podiatry  -to OR today -vascular surgery consulted as well, plan for angiogram on teusday, need to hold eliquis on Monday and teusday  -will follow podiatry/vascular surgery recommendation  Insulin-dependent type 2 diabetes, uncontrolled, A1c 7.4, with hyperglycemia -Adjust insulin as needed   Paroxysmal A. Fib Currently in sinus rhythm, continue Coreg, amiodarone, Eliquis (need to hold for vascular procedure on monday)  Chronic systolic heart failure Euvolemic Continue home medication Coreg, Jardiance, losartan -continue home diuretic torsemide and metolazone  Hypokalemia Replace K  CKD 3B Renal function appears at baseline  Constipation Senokot MiraLAX    The patient's BMI is: Body mass index is 33.91 kg/m.[name]       Unresulted Labs (From admission, onward)          Start     Ordered   10/03/20 0500  Creatinine, serum  (enoxaparin (LOVENOX)    CrCl >/= 30 ml/min)  Weekly,   STAT     Comments: while on enoxaparin therapy    09/26/20 2021   10/01/20 6270  Basic metabolic panel  Daily,   R     Question:  Specimen collection method  Answer:  Lab=Lab collect   09/30/20 1736   10/01/20 0500  Magnesium  Tomorrow morning,   R       Question:  Specimen collection method  Answer:  Lab=Lab collect   09/30/20 1736   09/30/20 1330  Aerobic/Anaerobic Culture w Gram Stain (surgical/deep wound)  RELEASE UPON ORDERING,   TIMED       Comments: Specimen A: Pre-op diagnosis: left foot wound    09/30/20 1330            DVT prophylaxis:  apixaban (ELIQUIS) tablet 5 mg   Code Status: Full Family Communication: Roommate at bedside with permission on 4/28 Disposition:   Status is: Inpatient   Dispo: The patient is from: Home              Anticipated d/c is to: Home              Anticipated d/c date is: To be determined, need podiatry and vascular surgery clearance                Consultants:   Podiatry  Vascular surgery  Procedures:   None  Antimicrobials:   Anti-infectives (From admission,  onward)   Start     Dose/Rate Route Frequency Ordered Stop   09/30/20 2200  linezolid (ZYVOX) tablet 600 mg        600 mg Oral Every 12 hours 09/30/20 1112     09/27/20 2000  vancomycin (VANCOCIN) IVPB 1000 mg/200 mL premix  Status:  Discontinued        1,000 mg 200 mL/hr over 60 Minutes Intravenous Every 24 hours 09/26/20 2040 09/30/20 1112   09/26/20 2200  ceFEPIme (MAXIPIME) 2 g in sodium chloride 0.9 % 100 mL IVPB        2 g 200 mL/hr over 30 Minutes Intravenous Every 12 hours 09/26/20 2025     09/26/20 2030  vancomycin (VANCOCIN) IVPB 1000 mg/200 mL premix  Status:  Discontinued        1,000 mg 200 mL/hr over 60 Minutes Intravenous  Once 09/26/20 2021 09/26/20 2024   09/26/20 2030  ceFEPIme  (MAXIPIME) 2 g in sodium chloride 0.9 % 100 mL IVPB  Status:  Discontinued        2 g 200 mL/hr over 30 Minutes Intravenous  Once 09/26/20 2021 09/26/20 2025   09/26/20 2030  vancomycin (VANCOREADY) IVPB 1500 mg/300 mL        1,500 mg 150 mL/hr over 120 Minutes Intravenous  Once 09/26/20 2023 09/27/20 0132   09/26/20 1845  vancomycin (VANCOCIN) IVPB 1000 mg/200 mL premix        1,000 mg 200 mL/hr over 60 Minutes Intravenous  Once 09/26/20 1839 09/26/20 2000          Objective: Vitals:   09/30/20 1430 09/30/20 1538 09/30/20 1600 09/30/20 1601  BP: (!) 103/53 (!) 106/59 (!) 100/55   Pulse: 70 74 70 69  Resp: 14 18 16    Temp: (!) 97.2 F (36.2 C) (!) 97.5 F (36.4 C) 97.7 F (36.5 C)   TempSrc:  Oral Oral   SpO2: 97% 99% 90% 94%  Weight:      Height:        Intake/Output Summary (Last 24 hours) at 09/30/2020 1736 Last data filed at 09/30/2020 1646 Gross per 24 hour  Intake 2824.64 ml  Output 1030 ml  Net 1794.64 ml   Filed Weights   09/26/20 1714 09/30/20 1133  Weight: 113.4 kg 113.4 kg    Examination:  General exam: calm, NAD Respiratory system: Clear to auscultation. Respiratory effort normal. Cardiovascular system: S1 & S2 heard, RRR. No JVD, no murmur, No pedal edema. Gastrointestinal system: Abdomen is nondistended, soft and nontender. Normal bowel sounds heard. Central nervous system: Alert and oriented. No focal neurological deficits. Extremities: left foot cellulitis, chronic venous stasis skin changes, no leg edema Skin: No rashes, lesions or ulcers Psychiatry: Judgement and insight appear normal. Mood & affect appropriate.     Data Reviewed: I have personally reviewed following labs and imaging studies  CBC: Recent Labs  Lab 09/26/20 1717 09/27/20 0600 09/28/20 0534 09/29/20 0433 09/30/20 0504  WBC 7.0 8.1 7.7 6.9 6.5  NEUTROABS 4.6  --   --  4.3 3.7  HGB 12.0* 13.2 12.9* 11.7* 12.3*  HCT 36.6* 39.8 39.3 35.6* 36.3*  MCV 96.6 98.0 96.6 96.7  96.8  PLT 147* 142* 136* 123* 125*    Basic Metabolic Panel: Recent Labs  Lab 09/26/20 1717 09/27/20 0600 09/28/20 0534 09/29/20 0433 09/30/20 0504  NA 135 138 139 137 138  K 3.9 3.5 4.2 3.1* 3.3*  CL 94* 94* 96* 96* 97*  CO2 27 31 31  29  29  GLUCOSE 171* 139* 125* 156* 146*  BUN 38* 36* 45* 51* 60*  CREATININE 1.97* 1.76* 2.04* 1.97* 1.99*  CALCIUM 9.1 9.3 9.1 8.8* 8.9  MG  --   --  2.3 2.1  --     GFR: Estimated Creatinine Clearance: 46.2 mL/min (A) (by C-G formula based on SCr of 1.99 mg/dL (H)).  Liver Function Tests: Recent Labs  Lab 09/26/20 1717  AST 23  ALT 20  ALKPHOS 86  BILITOT 1.0  PROT 7.8  ALBUMIN 3.9    CBG: Recent Labs  Lab 09/29/20 2055 09/30/20 0757 09/30/20 1131 09/30/20 1353 09/30/20 1712  GLUCAP 136* 97 120* 94 181*     Recent Results (from the past 240 hour(s))  Resp Panel by RT-PCR (Flu A&B, Covid) Nasopharyngeal Swab     Status: None   Collection Time: 09/26/20  8:02 PM   Specimen: Nasopharyngeal Swab; Nasopharyngeal(NP) swabs in vial transport medium  Result Value Ref Range Status   SARS Coronavirus 2 by RT PCR NEGATIVE NEGATIVE Final    Comment: (NOTE) SARS-CoV-2 target nucleic acids are NOT DETECTED.  The SARS-CoV-2 RNA is generally detectable in upper respiratory specimens during the acute phase of infection. The lowest concentration of SARS-CoV-2 viral copies this assay can detect is 138 copies/mL. A negative result does not preclude SARS-Cov-2 infection and should not be used as the sole basis for treatment or other patient management decisions. A negative result may occur with  improper specimen collection/handling, submission of specimen other than nasopharyngeal swab, presence of viral mutation(s) within the areas targeted by this assay, and inadequate number of viral copies(<138 copies/mL). A negative result must be combined with clinical observations, patient history, and epidemiological information. The expected  result is Negative.  Fact Sheet for Patients:  EntrepreneurPulse.com.au  Fact Sheet for Healthcare Providers:  IncredibleEmployment.be  This test is no t yet approved or cleared by the [place] FDA and  has been authorized for detection and/or diagnosis of SARS-CoV-2 by FDA under an Emergency Use Authorization (EUA). This EUA will remain  in effect (meaning this test can be used) for the duration of the COVID-19 declaration under Section 564(b)(1) of the Act, 21 U.S.C.section 360bbb-3(b)(1), unless the authorization is terminated  or revoked sooner.       Influenza A by PCR NEGATIVE NEGATIVE Final   Influenza B by PCR NEGATIVE NEGATIVE Final    Comment: (NOTE) The Xpert Xpress SARS-CoV-2/FLU/RSV plus assay is intended as an aid in the diagnosis of influenza from Nasopharyngeal swab specimens and should not be used as a sole basis for treatment. Nasal washings and aspirates are unacceptable for Xpert Xpress SARS-CoV-2/FLU/RSV testing.  Fact Sheet for Patients: EntrepreneurPulse.com.au  Fact Sheet for Healthcare Providers: IncredibleEmployment.be  This test is not yet approved or cleared by the [place] FDA and has been authorized for detection and/or diagnosis of SARS-CoV-2 by FDA under an Emergency Use Authorization (EUA). This EUA will remain in effect (meaning this test can be used) for the duration of the COVID-19 declaration under Section 564(b)(1) of the Act, 21 U.S.C. section 360bbb-3(b)(1), unless the authorization is terminated or revoked.  Performed at [facility], [address] [place], [place] [address]   Culture, blood (routine x 2)     Status: None (Preliminary result)   Collection Time: 09/26/20 11:07 PM   Specimen: BLOOD  Result Value Ref Range Status   Specimen Description BLOOD RIGHT ANTECUBITAL  Final   Special Requests   Final  BOTTLES DRAWN AEROBIC AND  ANAEROBIC Blood Culture results may not be optimal due to an inadequate volume of blood received in culture bottles   Culture   Final    NO GROWTH 4 DAYS Performed at [facility], [address] [place], [place] [address]    Report Status PENDING  Incomplete  Culture, blood (routine x 2)     Status: None (Preliminary result)   Collection Time: 09/26/20 11:07 PM   Specimen: BLOOD  Result Value Ref Range Status   Specimen Description BLOOD BLOOD LEFT FOREARM  Final   Special Requests   Final    BOTTLES DRAWN AEROBIC AND ANAEROBIC Blood Culture adequate volume   Culture   Final    NO GROWTH 4 DAYS Performed at [facility], [address], [place], [place] [address]    Report Status PENDING  Incomplete         Radiology Studies: MR FOOT LEFT WO CONTRAST  Result Date: 09/29/2020 CLINICAL DATA:  Left foot pain and swelling.  Diabetic. EXAM: MRI OF THE LEFT FOOT WITHOUT CONTRAST TECHNIQUE: Multiplanar, multisequence MR imaging of the foot left foot was performed. No intravenous contrast was administered. COMPARISON:  Radiographs 09/26/2020 FINDINGS: Diffuse subcutaneous soft tissue swelling/edema/fluid most notably along the dorsum of the forefoot and midfoot consistent with cellulitis. No obvious open wound. There are several small fluid collections likely representing micro abscesses. No discrete drainable abscess is identified. Diffuse myofasciitis without definite findings for pyomyositis. No MR findings to suggest septic arthritis or osteomyelitis. The major tendons and ligaments are intact. IMPRESSION: 1. Diffuse cellulitis and myofasciitis. 2. Small fluid collections in the dorsal subcutaneous tissues likely microabscesses but no discrete drainable abscess. 3. No MR findings to suggest septic arthritis or osteomyelitis. Electronically Signed   By: Marijo Sanes M.D.   On: 09/29/2020 07:50   US ARTERIAL ABI (SCREENING LOWER EXTREMITY)  Result Date:  09/29/2020 CLINICAL DATA:  Vascular ulcers, bilateral lower extremity skin color changes. Hypertension, diabetes, history of tobacco abuse. EXAM: NONINVASIVE PHYSIOLOGIC VASCULAR STUDY OF BILATERAL LOWER EXTREMITIES TECHNIQUE: Evaluation of both lower extremities were performed at rest, including calculation of ankle-brachial indices with single level Doppler, pressure and pulse volume recording. COMPARISON:  None. FINDINGS: Right ABI:  1.14 Left ABI:  1.13 Right Lower Extremity:  Normal arterial waveforms at the ankle. Left Lower Extremity:  Normal arterial waveforms at the ankle. IMPRESSION: No evidence of hemodynamically significant lower extremity arterial occlusive disease at rest. Electronically Signed   By: Lucrezia Europe M.D.   On: 09/29/2020 08:12        Scheduled Meds: . amiodarone  200 mg Oral Daily  . apixaban  5 mg Oral BID  . carvedilol  6.25 mg Oral BID WC  . clonazePAM  1 mg Oral QHS  . empagliflozin  10 mg Oral QPM  . glipiZIDE  10 mg Oral BID  . insulin aspart  0-5 Units Subcutaneous QHS  . insulin aspart  0-9 Units Subcutaneous TID WC  . insulin glargine  10 Units Subcutaneous Daily  . levothyroxine  50 mcg Oral Q0600  . linezolid  600 mg Oral Q12H  . losartan  12.5 mg Oral Daily  . magnesium oxide  400 mg Oral Daily  . metolazone  2.5 mg Oral Q M,W,F  . polyethylene glycol  17 g Oral BID  . potassium chloride SA  40 mEq Oral Daily  . psyllium  1 packet Oral Daily  . rosuvastatin  20 mg Oral QPM  .  senna-docusate  1 tablet Oral BID  . torsemide  40 mg Oral Daily   Continuous Infusions: . sodium chloride 75 mL/hr at 09/30/20 1509  . ceFEPime (MAXIPIME) IV 2 g (09/30/20 1053)     LOS: 4 days   Time spent: 72mins Greater than 50% of this time was spent in counseling, explanation of diagnosis, planning of further management, and coordination of care.   Voice Recognition Viviann Spare dictation system was used to create this note, attempts have been made to correct errors.  Please contact the author with questions and/or clarifications.   Florencia Reasons, MD PhD FACP [facility]  Available via Epic secure chat 7am-7pm for nonurgent issues Please page for urgent issues To page the attending provider between 7A-7P or the covering provider during after hours 7P-7A, please log into the web site [email or web address] and access using universal [place] password for that web site. If you do not have the password, please call the hospital operator.    09/30/2020, 5:36 PM

## 2020-09-30 NOTE — Anesthesia Procedure Notes (Signed)
Procedure Name: Intubation Date/Time: 09/30/2020 1:14 PM Performed by: Rona Ravens, CRNA Pre-anesthesia Checklist: Patient identified, Patient being monitored, Timeout performed, Emergency Drugs available and Suction available Patient Re-evaluated:Patient Re-evaluated prior to induction Oxygen Delivery Method: Circle system utilized Preoxygenation: Pre-oxygenation with 100% oxygen Induction Type: IV induction Ventilation: Mask ventilation without difficulty Laryngoscope Size: Mac, 3 and 4 Grade View: Grade II Tube type: Oral Tube size: 7.5 mm Number of attempts: 1 Airway Equipment and Method: Stylet Placement Confirmation: ETT inserted through vocal cords under direct vision,  positive ETCO2 and breath sounds checked- equal and bilateral Secured at: 22 cm Tube secured with: Tape Dental Injury: Teeth and Oropharynx as per pre-operative assessment

## 2020-09-30 NOTE — Progress Notes (Signed)
History and Physical Interval Note:  09/30/2020 12:45 PM  [name] [facility]  has presented today for surgery, with the diagnosis of left foot infection.  The various methods of treatment have been discussed with the patient and family. After consideration of risks, benefits and other options for treatment, the patient has consented to   Procedure(s): IRRIGATION AND DEBRIDEMENT FOOT (Left) as a surgical intervention.  The patient's history has been reviewed, patient examined, no change in status, stable for surgery.  I have reviewed the patient's chart and labs.  Questions were answered to the patient's satisfaction.     [name]

## 2020-09-30 NOTE — Op Note (Signed)
Patient Name: Atharv Barriere DOB: [birth year]  MRN: [id number]   Date of Service: 10/04/2020  Surgeon: Dr. Lanae Crumbly, DPM Assistants: None Pre-operative Diagnosis:  Left foot abscess Post-operative Diagnosis:  Left foot hematoma Procedures:  1) Pathology/Specimens: ID Type Source Tests Collected by Time Destination  A : hematoma-left foot Tissue PATH Other AEROBIC/ANAEROBIC CULTURE W GRAM STAIN (SURGICAL/DEEP WOUND) Criselda Peaches, DPM 09/30/2020 1326    Anesthesia: General Hemostasis: None Estimated Blood Loss: 5 mL Materials: * No implants in log * Medications: 10 cc half percent bupivacaine plain Complications: None  Indications for Procedure:  This is a 70 y.o. male with a history of type 2 diabetes and chronic anticoagulation with Eliquis.  He developed swelling and erythema and bruising of the left foot.  MRI preop revealed possible microabscess developing   Procedure in Detail: Patient was identified in pre-operative holding area. Formal consent was signed and the left lower extremity was marked. Patient was brought back to the operating room. Anesthesia was induced. The extremity was prepped and draped in the usual sterile fashion. Timeout was taken to confirm patient name, laterality, and procedure prior to incision.   Attention was then directed to the dorsal left foot where parallel incisions were made over the second and fourth interspace with a 5 cm skin bridge between.  This revealed a subcutaneous hematoma which was evacuated.  It did not appear infected.  No purulence was found.  A culture was taken.  It was irrigated with 2 L of normal sterile saline with a pulse irrigator.  Incisions were partially closed with 3-0 nylon and packed open with gauze packing  The foot was then dressed with Xeroform 4 x 4's ABD pad Kerlix and an Ace wrap under light compression. Patient tolerated the procedure well.   Disposition: Following a period of post-operative monitoring,  patient will be transferred to the floor.

## 2020-10-01 LAB — CULTURE, BLOOD (ROUTINE X 2)
Culture: NO GROWTH
Culture: NO GROWTH
Special Requests: ADEQUATE

## 2020-10-01 LAB — GLUCOSE, CAPILLARY
Glucose-Capillary: 71 mg/dL (ref 70–99)
Glucose-Capillary: 221 mg/dL — ABNORMAL HIGH (ref 70–99)
Glucose-Capillary: 157 mg/dL — ABNORMAL HIGH (ref 70–99)
Glucose-Capillary: 149 mg/dL — ABNORMAL HIGH (ref 70–99)

## 2020-10-01 LAB — BASIC METABOLIC PANEL
Anion gap: 10 (ref 5–15)
BUN: 62 mg/dL — ABNORMAL HIGH (ref 8–23)
CO2: 27 mmol/L (ref 22–32)
Calcium: 8.9 mg/dL (ref 8.9–10.3)
Chloride: 100 mmol/L (ref 98–111)
Creatinine, Ser: 1.99 mg/dL — ABNORMAL HIGH (ref 0.61–1.24)
GFR, Estimated: 36 mL/min — ABNORMAL LOW (ref >60)
Glucose, Bld: 90 mg/dL (ref 70–99)
Potassium: 4 mmol/L (ref 3.5–5.1)
Sodium: 137 mmol/L (ref 135–145)

## 2020-10-01 LAB — MAGNESIUM: Magnesium: 2.3 mg/dL (ref 1.7–2.4)

## 2020-10-01 NOTE — Progress Notes (Signed)
Patient requested am Miralax be scanned and packet provided to him. Stated he would "take it when I need it."

## 2020-10-01 NOTE — Progress Notes (Signed)
PROGRESS NOTE    [name] [facility]  LFY:[id number] DOB: 05-31-1951 DOA: 09/26/2020 PCP: Vidal Schwalbe, MD    Chief Complaint  Patient presents with  . Foot Pain    Brief Narrative:  [name] is a 70 y.o. Caucasian male with medical history significant for coronary artery disease, status post PCI and stents, type 2 diabetes mellitus, stage IIIb chronic kidney disease, hypertension, ischemic cardiomyopathy, paroxysmal atrial fibrillation on Eliquis and sleep apnea, who presented to the emergency room with acute onset of worsening left foot swelling with associated pain and discoloration, warmth and tenderness after walking from a while shopping on Saturday.   Subjective:  No acute interval changes  Complaining of right foot pain , requiring as needed analgesics  No fever , hemodynamically stable   Reports no bm since saturday, no ab pain, no n/v   Assessment & Plan:   Active Problems:   Cellulitis in diabetic foot (HCC)   Foot pain, left   Cellulitis   Hematoma of left foot   Diabetic foot infection vs ischemia, left -Blood culture no growth, foot x-ray/mri  did not show osteomyelitis -was on iV Vanco and cefepime, then linezolid and cefepime -podiatry consulted,s/p I/d on 4/29, culture no growth -vascular surgery consulted as well, plan for angiogram on teusday, need to hold eliquis on Monday and teusday prior to procedure, resume when ok with vascular -will follow podiatry/vascular surgery recommendation  Insulin-dependent type 2 diabetes, uncontrolled, A1c 7.4, with hyperglycemia -Adjust insulin as needed   Paroxysmal A. Fib Currently in sinus rhythm, continue Coreg, amiodarone, Eliquis (need to hold for vascular procedure on monday)  Chronic systolic heart failure Euvolemic Continue home medication Coreg, Jardiance, losartan -continue home diuretic torsemide and metolazone  Hypokalemia Replace K  CKD 3B Renal function appears at  baseline  Constipation Senokot MiraLAX    The patient's BMI is: Body mass index is 33.91 kg/m.[name]      Unresulted Labs (From admission, onward)          Start     Ordered   10/03/20 0500  Creatinine, serum  (enoxaparin (LOVENOX)    CrCl >/= 30 ml/min)  Weekly,   STAT     Comments: while on enoxaparin therapy    09/26/20 2021   10/01/20 8527  Basic metabolic panel  Daily,   R     Question:  Specimen collection method  Answer:  Lab=Lab collect   09/30/20 1736            DVT prophylaxis:  apixaban (ELIQUIS) tablet 5 mg   Code Status: Full Family Communication: Roommate at bedside with permission on 4/28 Disposition:   Status is: Inpatient   Dispo: The patient is from: Home              Anticipated d/c is to: Home              Anticipated d/c date is: To be determined, need podiatry and vascular surgery clearance                Consultants:   Podiatry  Vascular surgery  Procedures:   I/D left foot on 4/29  Antimicrobials:   Anti-infectives (From admission, onward)   Start     Dose/Rate Route Frequency Ordered Stop   09/30/20 2200  linezolid (ZYVOX) tablet 600 mg        600 mg Oral Every 12 hours 09/30/20 1112     09/27/20 2000  vancomycin (VANCOCIN) IVPB 1000 mg/200 mL premix  Status:  Discontinued        1,000 mg 200 mL/hr over 60 Minutes Intravenous Every 24 hours 09/26/20 2040 09/30/20 1112   09/26/20 2200  ceFEPIme (MAXIPIME) 2 g in sodium chloride 0.9 % 100 mL IVPB        2 g 200 mL/hr over 30 Minutes Intravenous Every 12 hours 09/26/20 2025     09/26/20 2030  vancomycin (VANCOCIN) IVPB 1000 mg/200 mL premix  Status:  Discontinued        1,000 mg 200 mL/hr over 60 Minutes Intravenous  Once 09/26/20 2021 09/26/20 2024   09/26/20 2030  ceFEPIme (MAXIPIME) 2 g in sodium chloride 0.9 % 100 mL IVPB  Status:  Discontinued        2 g 200 mL/hr over 30 Minutes Intravenous  Once 09/26/20 2021 09/26/20 2025   09/26/20 2030  vancomycin (VANCOREADY) IVPB  1500 mg/300 mL        1,500 mg 150 mL/hr over 120 Minutes Intravenous  Once 09/26/20 2023 09/27/20 0132   09/26/20 1845  vancomycin (VANCOCIN) IVPB 1000 mg/200 mL premix        1,000 mg 200 mL/hr over 60 Minutes Intravenous  Once 09/26/20 1839 09/26/20 2000          Objective: Vitals:   10/01/20 0504 10/01/20 0807 10/01/20 1158 10/01/20 1543  BP: (!) 109/58 (!) 110/58 107/61 (!) 106/58  Pulse: 79 86 71 74  Resp: 20 16 19 18   Temp: 99.2 F (37.3 C) 98.4 F (36.9 C) 98.3 F (36.8 C) 98.3 F (36.8 C)  TempSrc: Oral  Oral Oral  SpO2: 91% 97% 100% 92%  Weight:      Height:        Intake/Output Summary (Last 24 hours) at 10/01/2020 1635 Last data filed at 10/01/2020 1416 Gross per 24 hour  Intake 2368.68 ml  Output 1100 ml  Net 1268.68 ml   Filed Weights   09/26/20 1714 09/30/20 1133  Weight: 113.4 kg 113.4 kg    Examination:  General exam: calm, NAD Respiratory system: Clear to auscultation. Respiratory effort normal. Cardiovascular system: S1 & S2 heard, RRR. No JVD, no murmur, No pedal edema. Gastrointestinal system: Abdomen is nondistended, soft and nontender. Normal bowel sounds heard. Central nervous system: Alert and oriented. No focal neurological deficits. Extremities: left foot postop changes, chronic venous stasis skin changes, no leg edema Skin: No rashes, lesions or ulcers Psychiatry: Judgement and insight appear normal. Mood & affect appropriate.     Data Reviewed: I have personally reviewed following labs and imaging studies  CBC: Recent Labs  Lab 09/26/20 1717 09/27/20 0600 09/28/20 0534 09/29/20 0433 09/30/20 0504  WBC 7.0 8.1 7.7 6.9 6.5  NEUTROABS 4.6  --   --  4.3 3.7  HGB 12.0* 13.2 12.9* 11.7* 12.3*  HCT 36.6* 39.8 39.3 35.6* 36.3*  MCV 96.6 98.0 96.6 96.7 96.8  PLT 147* 142* 136* 123* 125*    Basic Metabolic Panel: Recent Labs  Lab 09/27/20 0600 09/28/20 0534 09/29/20 0433 09/30/20 0504 10/01/20 0429  NA 138 139 137 138  137  K 3.5 4.2 3.1* 3.3* 4.0  CL 94* 96* 96* 97* 100  CO2 31 31 29 29 27   GLUCOSE 139* 125* 156* 146* 90  BUN 36* 45* 51* 60* 62*  CREATININE 1.76* 2.04* 1.97* 1.99* 1.99*  CALCIUM 9.3 9.1 8.8* 8.9 8.9  MG  --  2.3 2.1  --  2.3    GFR: Estimated Creatinine Clearance: 46.2 mL/min (A) (by  C-G formula based on SCr of 1.99 mg/dL (H)).  Liver Function Tests: Recent Labs  Lab 09/26/20 1717  AST 23  ALT 20  ALKPHOS 86  BILITOT 1.0  PROT 7.8  ALBUMIN 3.9    CBG: Recent Labs  Lab 09/30/20 1353 09/30/20 1712 09/30/20 2122 10/01/20 0740 10/01/20 1200  GLUCAP 94 181* 165* 71 221*     Recent Results (from the past 240 hour(s))  Resp Panel by RT-PCR (Flu A&B, Covid) Nasopharyngeal Swab     Status: None   Collection Time: 09/26/20  8:02 PM   Specimen: Nasopharyngeal Swab; Nasopharyngeal(NP) swabs in vial transport medium  Result Value Ref Range Status   SARS Coronavirus 2 by RT PCR NEGATIVE NEGATIVE Final    Comment: (NOTE) SARS-CoV-2 target nucleic acids are NOT DETECTED.  The SARS-CoV-2 RNA is generally detectable in upper respiratory specimens during the acute phase of infection. The lowest concentration of SARS-CoV-2 viral copies this assay can detect is 138 copies/mL. A negative result does not preclude SARS-Cov-2 infection and should not be used as the sole basis for treatment or other patient management decisions. A negative result may occur with  improper specimen collection/handling, submission of specimen other than nasopharyngeal swab, presence of viral mutation(s) within the areas targeted by this assay, and inadequate number of viral copies(<138 copies/mL). A negative result must be combined with clinical observations, patient history, and epidemiological information. The expected result is Negative.  Fact Sheet for Patients:  EntrepreneurPulse.com.au  Fact Sheet for Healthcare Providers:  IncredibleEmployment.be  This  test is no t yet approved or cleared by the [place] FDA and  has been authorized for detection and/or diagnosis of SARS-CoV-2 by FDA under an Emergency Use Authorization (EUA). This EUA will remain  in effect (meaning this test can be used) for the duration of the COVID-19 declaration under Section 564(b)(1) of the Act, 21 U.S.C.section 360bbb-3(b)(1), unless the authorization is terminated  or revoked sooner.       Influenza A by PCR NEGATIVE NEGATIVE Final   Influenza B by PCR NEGATIVE NEGATIVE Final    Comment: (NOTE) The Xpert Xpress SARS-CoV-2/FLU/RSV plus assay is intended as an aid in the diagnosis of influenza from Nasopharyngeal swab specimens and should not be used as a sole basis for treatment. Nasal washings and aspirates are unacceptable for Xpert Xpress SARS-CoV-2/FLU/RSV testing.  Fact Sheet for Patients: EntrepreneurPulse.com.au  Fact Sheet for Healthcare Providers: IncredibleEmployment.be  This test is not yet approved or cleared by the [place] FDA and has been authorized for detection and/or diagnosis of SARS-CoV-2 by FDA under an Emergency Use Authorization (EUA). This EUA will remain in effect (meaning this test can be used) for the duration of the COVID-19 declaration under Section 564(b)(1) of the Act, 21 U.S.C. section 360bbb-3(b)(1), unless the authorization is terminated or revoked.  Performed at [facility], [address] [place], [place] [address]   Culture, blood (routine x 2)     Status: None   Collection Time: 09/26/20 11:07 PM   Specimen: BLOOD  Result Value Ref Range Status   Specimen Description BLOOD RIGHT ANTECUBITAL  Final   Special Requests   Final    BOTTLES DRAWN AEROBIC AND ANAEROBIC Blood Culture results may not be optimal due to an inadequate volume of blood received in culture bottles   Culture   Final    NO GROWTH 5 DAYS Performed at [facility], [address] [place], [place] [address]    Report Status  10/01/2020 FINAL  Final  Culture, blood (routine x 2)     Status: None   Collection Time: 09/26/20 11:07 PM   Specimen: BLOOD  Result Value Ref Range Status   Specimen Description BLOOD BLOOD LEFT FOREARM  Final   Special Requests   Final    BOTTLES DRAWN AEROBIC AND ANAEROBIC Blood Culture adequate volume   Culture   Final    NO GROWTH 5 DAYS Performed at [facility], [address] [place], [place] [address]    Report Status 10/01/2020 FINAL  Final  Aerobic/Anaerobic Culture w Gram Stain (surgical/deep wound)     Status: None (Preliminary result)   Collection Time: 09/30/20  1:26 PM   Specimen: PATH Other; Tissue  Result Value Ref Range Status   Specimen Description   Final    FOOT hematoma left foot Performed at [facility], [address], [place], [place] [address]    Special Requests   Final    NONE Performed at [facility], [place], [place] [address]    Gram Stain NO WBC SEEN NO ORGANISMS SEEN   Final   Culture   Final    NO GROWTH < 12 HOURS Performed at [facility], [place] [address], [place], [place] [address]    Report Status PENDING  Incomplete         Radiology Studies: No results found.      Scheduled Meds: . amiodarone  200 mg Oral Daily  . apixaban  5 mg Oral BID  . carvedilol  6.25 mg Oral BID WC  . clonazePAM  1 mg Oral QHS  . empagliflozin  10 mg Oral QPM  . glipiZIDE  10 mg Oral BID  . insulin aspart  0-5 Units Subcutaneous QHS  . insulin aspart  0-9 Units Subcutaneous TID WC  . insulin glargine  10 Units Subcutaneous Daily  . levothyroxine  50 mcg Oral Q0600  . linezolid  600 mg Oral Q12H  . losartan  12.5 mg Oral Daily  . magnesium oxide  400 mg Oral Daily  . metolazone  2.5 mg Oral Q M,W,F  . polyethylene glycol  17 g Oral BID  . potassium chloride SA  40 mEq Oral Daily  . psyllium  1 packet Oral Daily  . rosuvastatin  20 mg  Oral QPM  . senna-docusate  1 tablet Oral BID  . torsemide  40 mg Oral Daily   Continuous Infusions: . ceFEPime (MAXIPIME) IV Stopped (10/01/20 0913)     LOS: 5 days   Time spent: 31mins Greater than 50% of this time was spent in counseling, explanation of diagnosis, planning of further management, and coordination of care.   Voice Recognition Viviann Spare dictation system was used to create this note, attempts have been made to correct errors. Please contact the author with questions and/or clarifications.   Florencia Reasons, MD PhD FACP [facility]  Available via Epic secure chat 7am-7pm for nonurgent issues Please page for urgent issues To page the attending provider between 7A-7P or the covering provider during after hours 7P-7A, please log into the web site [email or web address] and access using universal [place] password for that web site. If you do not have the password, please call the hospital operator.    10/01/2020, 4:35 PM

## 2020-10-02 LAB — BASIC METABOLIC PANEL
Anion gap: 9 (ref 5–15)
BUN: 75 mg/dL — ABNORMAL HIGH (ref 8–23)
CO2: 27 mmol/L (ref 22–32)
Calcium: 8.7 mg/dL — ABNORMAL LOW (ref 8.9–10.3)
Chloride: 100 mmol/L (ref 98–111)
Creatinine, Ser: 2.17 mg/dL — ABNORMAL HIGH (ref 0.61–1.24)
GFR, Estimated: 32 mL/min — ABNORMAL LOW (ref >60)
Glucose, Bld: 89 mg/dL (ref 70–99)
Potassium: 4.2 mmol/L (ref 3.5–5.1)
Sodium: 136 mmol/L (ref 135–145)

## 2020-10-02 LAB — GLUCOSE, CAPILLARY
Glucose-Capillary: 61 mg/dL — ABNORMAL LOW (ref 70–99)
Glucose-Capillary: 196 mg/dL — ABNORMAL HIGH (ref 70–99)
Glucose-Capillary: 184 mg/dL — ABNORMAL HIGH (ref 70–99)
Glucose-Capillary: 199 mg/dL — ABNORMAL HIGH (ref 70–99)

## 2020-10-02 MED ORDER — SODIUM CHLORIDE 0.9% FLUSH
10.0000 mL | Freq: Two times a day (BID) | INTRAVENOUS | Status: DC
  Administered 2020-10-02 – 2020-10-03 (×3): 10 mL via INTRAVENOUS

## 2020-10-02 NOTE — Progress Notes (Signed)
PROGRESS NOTE    [name] [facility]  ENI:[id number] DOB: [birth year] DOA: 09/26/2020 PCP: Vidal Schwalbe, MD    Chief Complaint  Patient presents with  . Foot Pain    Brief Narrative:  [name] is a 70 y.o. Caucasian male with medical history significant for coronary artery disease, status post PCI and stents, type 2 diabetes mellitus, stage IIIb chronic kidney disease, hypertension, ischemic cardiomyopathy, paroxysmal atrial fibrillation on Eliquis and sleep apnea, who presented to the emergency room with acute onset of worsening left foot swelling with associated pain and discoloration, warmth and tenderness after walking from a while shopping on Saturday.   Subjective:  Had hypoglycemia this am  Left  foot pain and discoloration has improved  No fever , hemodynamically stable   Assessment & Plan:   Active Problems:   Cellulitis in diabetic foot (HCC)   Foot pain, left   Cellulitis   Hematoma of left foot   Left foot hematoma,  infection  -Blood culture no growth, foot x-ray/mri  did not show osteomyelitis -did have lactic acidosis on presentation, lactic acid 2.7 -podiatry consulted,s/p I/d on 4/29, culture no growth, per podiatry op note, wound does not appear infected -was on iV Vanco and cefepime, then linezolid and cefepime, finished total of 7 days treatment -vascular surgery consulted as well, plan for angiogram on Tuesday, need to hold eliquis on Monday and Tuesday prior to procedure, resume when ok with vascular -podiatry/vascular surgery input appreciated   Insulin-dependent type 2 diabetes, uncontrolled, A1c 7.4, with hyperglycemia and hypoglycemia -he does not want to discontinue glipizide, he agreed to discontinue lantus and continue the rest of diabetic meds -monitor    Paroxysmal A. Fib Currently in sinus rhythm, continue Coreg, amiodarone, Eliquis (need to hold for vascular procedure on monday)  Chronic systolic heart  failure Euvolemic Continue home medication Coreg, Jardiance, losartan -continue home diuretic torsemide and metolazone  Hypokalemia  K replaced   CKD 3B Renal function appears at baseline  Constipation Senokot MiraLAX    The patient's BMI is: Body mass index is 33.91 kg/m.[name]      Unresulted Labs (From admission, onward)          Start     Ordered   10/03/20 0500  Creatinine, serum  (enoxaparin (LOVENOX)    CrCl >/= 30 ml/min)  Weekly,   STAT     Comments: while on enoxaparin therapy    09/26/20 2021   10/03/20 0500  ESR  Tomorrow morning,   R       Question:  Specimen collection method  Answer:  Lab=Lab collect   10/02/20 1612   10/03/20 0500  C-reactive protein  Tomorrow morning,   R       Question:  Specimen collection method  Answer:  Lab=Lab collect   10/02/20 1612   10/03/20 0500  CBC  Tomorrow morning,   R       Question:  Specimen collection method  Answer:  Lab=Lab collect   10/02/20 1612   10/01/20 6144  Basic metabolic panel  Daily,   R     Question:  Specimen collection method  Answer:  Lab=Lab collect   09/30/20 1736            DVT prophylaxis:  apixaban (ELIQUIS) tablet 5 mg   Code Status: Full Family Communication: Roommate at bedside with permission on 4/28 Disposition:   Status is: Inpatient   Dispo: The patient is from: Home  Anticipated d/c is to: Home              Anticipated d/c date is: To be determined, need podiatry and vascular surgery clearance                Consultants:   Podiatry  Vascular surgery  Procedures:   I/D left foot on 4/29  Antimicrobials:   Anti-infectives (From admission, onward)   Start     Dose/Rate Route Frequency Ordered Stop   09/30/20 2200  linezolid (ZYVOX) tablet 600 mg  Status:  Discontinued        600 mg Oral Every 12 hours 09/30/20 1112 10/02/20 1218   09/27/20 2000  vancomycin (VANCOCIN) IVPB 1000 mg/200 mL premix  Status:  Discontinued        1,000 mg 200 mL/hr over 60  Minutes Intravenous Every 24 hours 09/26/20 2040 09/30/20 1112   09/26/20 2200  ceFEPIme (MAXIPIME) 2 g in sodium chloride 0.9 % 100 mL IVPB  Status:  Discontinued        2 g 200 mL/hr over 30 Minutes Intravenous Every 12 hours 09/26/20 2025 10/02/20 1218   09/26/20 2030  vancomycin (VANCOCIN) IVPB 1000 mg/200 mL premix  Status:  Discontinued        1,000 mg 200 mL/hr over 60 Minutes Intravenous  Once 09/26/20 2021 09/26/20 2024   09/26/20 2030  ceFEPIme (MAXIPIME) 2 g in sodium chloride 0.9 % 100 mL IVPB  Status:  Discontinued        2 g 200 mL/hr over 30 Minutes Intravenous  Once 09/26/20 2021 09/26/20 2025   09/26/20 2030  vancomycin (VANCOREADY) IVPB 1500 mg/300 mL        1,500 mg 150 mL/hr over 120 Minutes Intravenous  Once 09/26/20 2023 09/27/20 0132   09/26/20 1845  vancomycin (VANCOCIN) IVPB 1000 mg/200 mL premix        1,000 mg 200 mL/hr over 60 Minutes Intravenous  Once 09/26/20 1839 09/26/20 2000          Objective: Vitals:   10/01/20 2309 10/02/20 0505 10/02/20 0820 10/02/20 1146  BP: 99/61 104/60 (!) 102/54 114/67  Pulse: 68 67 71 73  Resp: '18 18 17 18  ' Temp: 98.5 F (36.9 C) 98.7 F (37.1 C)  97.6 F (36.4 C)  TempSrc: Oral   Oral  SpO2: 96% 100% 99% 100%  Weight:      Height:        Intake/Output Summary (Last 24 hours) at 10/02/2020 1613 Last data filed at 10/02/2020 1328 Gross per 24 hour  Intake 640 ml  Output 1790 ml  Net -1150 ml   Filed Weights   09/26/20 1714 09/30/20 1133  Weight: 113.4 kg 113.4 kg    Examination:  General exam: calm, NAD Respiratory system: Clear to auscultation. Respiratory effort normal. Cardiovascular system: S1 & S2 heard, RRR. No JVD, no murmur, No pedal edema. Gastrointestinal system: Abdomen is nondistended, soft and nontender. Normal bowel sounds heard. Central nervous system: Alert and oriented. No focal neurological deficits. Extremities: left foot postop changes, chronic venous stasis skin changes, no leg  edema Skin: No rashes, lesions or ulcers Psychiatry: Judgement and insight appear normal. Mood & affect appropriate.     Data Reviewed: I have personally reviewed following labs and imaging studies  CBC: Recent Labs  Lab 09/26/20 1717 09/27/20 0600 09/28/20 0534 09/29/20 0433 09/30/20 0504  WBC 7.0 8.1 7.7 6.9 6.5  NEUTROABS 4.6  --   --  4.3 3.7  HGB 12.0* 13.2 12.9* 11.7* 12.3*  HCT 36.6* 39.8 39.3 35.6* 36.3*  MCV 96.6 98.0 96.6 96.7 96.8  PLT 147* 142* 136* 123* 125*    Basic Metabolic Panel: Recent Labs  Lab 09/28/20 0534 09/29/20 0433 09/30/20 0504 10/01/20 0429 10/02/20 0506  NA 139 137 138 137 136  K 4.2 3.1* 3.3* 4.0 4.2  CL 96* 96* 97* 100 100  CO2 '31 29 29 27 27  ' GLUCOSE 125* 156* 146* 90 89  BUN 45* 51* 60* 62* 75*  CREATININE 2.04* 1.97* 1.99* 1.99* 2.17*  CALCIUM 9.1 8.8* 8.9 8.9 8.7*  MG 2.3 2.1  --  2.3  --     GFR: Estimated Creatinine Clearance: 42.4 mL/min (A) (by C-G formula based on SCr of 2.17 mg/dL (H)).  Liver Function Tests: Recent Labs  Lab 09/26/20 1717  AST 23  ALT 20  ALKPHOS 86  BILITOT 1.0  PROT 7.8  ALBUMIN 3.9    CBG: Recent Labs  Lab 10/01/20 1200 10/01/20 1655 10/01/20 2127 10/02/20 0757 10/02/20 1148  GLUCAP 221* 157* 149* 61* 196*     Recent Results (from the past 240 hour(s))  Resp Panel by RT-PCR (Flu A&B, Covid) Nasopharyngeal Swab     Status: None   Collection Time: 09/26/20  8:02 PM   Specimen: Nasopharyngeal Swab; Nasopharyngeal(NP) swabs in vial transport medium  Result Value Ref Range Status   SARS Coronavirus 2 by RT PCR NEGATIVE NEGATIVE Final    Comment: (NOTE) SARS-CoV-2 target nucleic acids are NOT DETECTED.  The SARS-CoV-2 RNA is generally detectable in upper respiratory specimens during the acute phase of infection. The lowest concentration of SARS-CoV-2 viral copies this assay can detect is 138 copies/mL. A negative result does not preclude SARS-Cov-2 infection and should not be  used as the sole basis for treatment or other patient management decisions. A negative result may occur with  improper specimen collection/handling, submission of specimen other than nasopharyngeal swab, presence of viral mutation(s) within the areas targeted by this assay, and inadequate number of viral copies(<138 copies/mL). A negative result must be combined with clinical observations, patient history, and epidemiological information. The expected result is Negative.  Fact Sheet for Patients:  EntrepreneurPulse.com.au  Fact Sheet for Healthcare Providers:  IncredibleEmployment.be  This test is no t yet approved or cleared by the [place] FDA and  has been authorized for detection and/or diagnosis of SARS-CoV-2 by FDA under an Emergency Use Authorization (EUA). This EUA will remain  in effect (meaning this test can be used) for the duration of the COVID-19 declaration under Section 564(b)(1) of the Act, 21 U.S.C.section 360bbb-3(b)(1), unless the authorization is terminated  or revoked sooner.       Influenza A by PCR NEGATIVE NEGATIVE Final   Influenza B by PCR NEGATIVE NEGATIVE Final    Comment: (NOTE) The Xpert Xpress SARS-CoV-2/FLU/RSV plus assay is intended as an aid in the diagnosis of influenza from Nasopharyngeal swab specimens and should not be used as a sole basis for treatment. Nasal washings and aspirates are unacceptable for Xpert Xpress SARS-CoV-2/FLU/RSV testing.  Fact Sheet for Patients: EntrepreneurPulse.com.au  Fact Sheet for Healthcare Providers: IncredibleEmployment.be  This test is not yet approved or cleared by the [place] FDA and has been authorized for detection and/or diagnosis of SARS-CoV-2 by FDA under an Emergency Use Authorization (EUA). This EUA will remain in effect (meaning this test can be used) for the duration of the COVID-19 declaration under Section  564(b)(1) of the Act, 21  U.S.C. section 360bbb-3(b)(1), unless the authorization is terminated or revoked.  Performed at [facility], [address] [place], [place] [address]   Culture, blood (routine x 2)     Status: None   Collection Time: 09/26/20 11:07 PM   Specimen: BLOOD  Result Value Ref Range Status   Specimen Description BLOOD RIGHT ANTECUBITAL  Final   Special Requests   Final    BOTTLES DRAWN AEROBIC AND ANAEROBIC Blood Culture results may not be optimal due to an inadequate volume of blood received in culture bottles   Culture   Final    NO GROWTH 5 DAYS Performed at [facility], [address], [place], [place] [address]    Report Status 10/01/2020 FINAL  Final  Culture, blood (routine x 2)     Status: None   Collection Time: 09/26/20 11:07 PM   Specimen: BLOOD  Result Value Ref Range Status   Specimen Description BLOOD BLOOD LEFT FOREARM  Final   Special Requests   Final    BOTTLES DRAWN AEROBIC AND ANAEROBIC Blood Culture adequate volume   Culture   Final    NO GROWTH 5 DAYS Performed at [facility], [address], [place], [place] [address]    Report Status 10/01/2020 FINAL  Final  Aerobic/Anaerobic Culture w Gram Stain (surgical/deep wound)     Status: None (Preliminary result)   Collection Time: 09/30/20  1:26 PM   Specimen: PATH Other; Tissue  Result Value Ref Range Status   Specimen Description   Final    FOOT hematoma left foot Performed at [facility], [address], [place], [place] [address]    Special Requests   Final    NONE Performed at [facility], [address] [place], [place] [address]    Gram Stain NO WBC SEEN NO ORGANISMS SEEN   Final   Culture   Final    NO GROWTH 2 DAYS NO ANAEROBES ISOLATED; CULTURE IN PROGRESS FOR 5 DAYS Performed at [facility], [place] [address], [place], [place] [address]    Report Status PENDING  Incomplete         Radiology Studies: No  results found.      Scheduled Meds: . amiodarone  200 mg Oral Daily  . apixaban  5 mg Oral BID  . carvedilol  6.25 mg Oral BID WC  . clonazePAM  1 mg Oral QHS  . empagliflozin  10 mg Oral QPM  . glipiZIDE  10 mg Oral BID  . insulin aspart  0-5 Units Subcutaneous QHS  . insulin aspart  0-9 Units Subcutaneous TID WC  . levothyroxine  50 mcg Oral Q0600  . losartan  12.5 mg Oral Daily  . magnesium oxide  400 mg Oral Daily  . metolazone  2.5 mg Oral Q M,W,F  . polyethylene glycol  17 g Oral BID  . potassium chloride SA  40 mEq Oral Daily  . psyllium  1 packet Oral Daily  . rosuvastatin  20 mg Oral QPM  . senna-docusate  1 tablet Oral BID  . torsemide  40 mg Oral Daily   Continuous Infusions:    LOS: 6 days   Time spent: 49mns Greater than 50% of this time was spent in counseling, explanation of diagnosis, planning of further management, and coordination of care.   Voice Recognition /Viviann Sparedictation system was used to create this note, attempts have been made to correct errors. Please contact the author with questions and/or  clarifications.   Florencia Reasons, MD PhD FACP [facility]  Available via Epic secure chat 7am-7pm for nonurgent issues Please page for urgent issues To page the attending provider between 7A-7P or the covering provider during after hours 7P-7A, please log into the web site [email or web address] and access using universal [place] password for that web site. If you do not have the password, please call the hospital operator.    10/02/2020, 4:13 PM

## 2020-10-02 NOTE — Plan of Care (Signed)

## 2020-10-02 NOTE — TOC Progression Note (Signed)
Transition of Care [facility]) - Progression Note    Patient Details  Name: Geovanny Sartin MRN: [id number] Date of Birth: 1950-09-14  Transition of [facility] CM/SW [place], LCSW Phone Number: 10/02/2020, 12:59 PM  Clinical Narrative:   Per RN in rounds patient would benefit from [facility] for wound care, CSW confirmed with MD. CSW spoke with patient who says he has had a bad experience in the past with [facility] agency wanting him to lock up his dogs which he refuses to do. Offered to try different agencies, patient refused. He says his roommate can do wound care for him.     Expected Discharge Plan: Home/Self Care Barriers to Discharge: Continued Medical Work up  Expected Discharge Plan and Services Expected Discharge Plan: Home/Self Care       Living arrangements for the past 2 months: Single Family Home                                       Social Determinants of Health (SDOH) Interventions    Readmission Risk Interventions Readmission Risk Prevention Plan 09/28/2020 12/11/2019  Transportation Screening Complete Complete  PCP or Specialist Appt within 3-5 Days Complete Complete  Social Work Consult for [employer] Planning/Counseling Complete Complete  Palliative Care Screening Not Applicable Not Applicable  Medication Review Press photographer) Complete Complete  Some recent data might be hidden

## 2020-10-02 NOTE — Plan of Care (Signed)
Pt calm throughout the night. Prn pain adm for leg pain. Pt refuses dressing change to leg stating that he would like to speak with MD about pain med options before dressing changesand would rather wait for MD change it. Safety measures in place. Will continue to monitor.  Problem: Education: Goal: Knowledge of General Education information will improve Description: Including pain rating scale, medication(s)/side effects and non-pharmacologic comfort measures Outcome: Progressing   Problem: Health Behavior/Discharge Planning: Goal: Ability to manage health-related needs will improve Outcome: Progressing   Problem: Clinical Measurements: Goal: Ability to maintain clinical measurements within normal limits will improve Outcome: Progressing Goal: Will remain free from infection Outcome: Progressing Goal: Diagnostic test results will improve Outcome: Progressing Goal: Respiratory complications will improve Outcome: Progressing Goal: Cardiovascular complication will be avoided Outcome: Progressing   Problem: Nutrition: Goal: Adequate nutrition will be maintained Outcome: Progressing   Problem: Coping: Goal: Level of anxiety will decrease Outcome: Progressing

## 2020-10-02 NOTE — Progress Notes (Signed)
Patient refused juice offered for blood sugar at 63

## 2020-10-02 NOTE — Progress Notes (Signed)
  Subjective:  Patient ID: [name], male    DOB: Oct 08, 1950,  MRN: [id number]  A 70 y.o. male presents with left foot hematoma to the dorsal foot status post incision drainage and removal of hematoma.  Patient states he is doing well.  His pain is pretty well controlled.  He denies any nausea fever chills vomiting.  The redness is improving. Objective:   Vitals:   10/02/20 0505 10/02/20 0820  BP: 104/60 (!) 102/54  Pulse: 67 71  Resp: 18 17  Temp: 98.7 F (37.1 C)   SpO2: 100% 99%   General AA&O x3. Normal mood and affect.  Vascular Dorsalis pedis and posterior tibial pulses 2/4 bilat. Brisk capillary refill to all digits. Pedal hair present.  Neurologic Epicritic sensation grossly intact.  Dermatologic  left dorsal forefoot incision.  No further hematoma was decompressed.  Redness is regressing.  Orthopedic: MMT 5/5 in dorsiflexion, plantarflexion, inversion, and eversion. Normal joint ROM without pain or crepitus.        Assessment & Plan:  Patient was evaluated and treated and all questions answered. Left foot hematoma status post incision and drainage washout and evacuation of hematoma -All questions and concerns were discussed with the patient extensive detail -Continue doing local wound care with saline washout and iodoform packing followed by 4 x 4 gauze Kerlix Ace bandage. -Nursing dressing changes orders orders are in the system for daily dressing changes -Awaiting vascular surgery for restoration of flow.  [name] scheduled on Tuesday. -Weightbearing as tolerated with a surgical shoe.  Felipa Furnace, DPM  Accessible via secure chat for questions or concerns.

## 2020-10-02 NOTE — Progress Notes (Signed)
Notified Dr.Duncan of large bloody stool,new orders placed for SCDs and hemoglobin level check. Patient has leg pain and cannot tolerate scds.

## 2020-10-02 NOTE — Progress Notes (Deleted)
Notified Dr.Duncan of large bloody stool,new orders placed for SCDs and hemoglobin level check. Patient has leg pain and cannot tolerate scds.

## 2020-10-02 NOTE — Evaluation (Signed)
Physical Therapy Evaluation Patient Details Name: [name] MRN: [id number] DOB: 01-13-1951 Today's Date: 10/02/2020   History of Present Illness  [name] is a 70 y.o. Caucasian male with medical history significant for coronary artery disease, status post PCI and stents, type 2 diabetes mellitus, stage IIIb chronic kidney disease, hypertension, ischemic cardiomyopathy, paroxysmal atrial fibrillation on Eliquis and sleep apnea, who presented to the emergency room with acute onset of worsening left foot swelling with associated pain and discoloration, warmth and tenderness after walking from a while shopping on Saturday.  Clinical Impression  Pt seen for PT evaluation with pt very verbose throughout session. Pt reluctantly agreeable to participation with PT educating him on need to wear post-op shoe for mobility & pt encouraged to ambulate into hallway but pt feeling very unstable with post-op shoe donned without another shoe on RLE. Pt agreeable to ambulating in room & ambulates around bed & back with supervision, initially holding to bed rail/furniture for support but then able to ambulate without UE support. PT educated pt on benefits of acute PT & need for further gait trials & stair negotiation with post-op shoe to increase pt's comfort & ensure he's safe with mobility prior to d/c home. Will attempt to f/u with pt prior to d/c.     Follow Up Recommendations Home health PT;Supervision - Intermittent    Equipment Recommendations  Rolling walker with 5" wheels    Recommendations for Other Services       Precautions / Restrictions Precautions Precautions: Fall Required Braces or Orthoses: Other Brace Other Brace: LLE post op shoe Restrictions Weight Bearing Restrictions: Yes LLE Weight Bearing: Weight bearing as tolerated Other Position/Activity Restrictions: LLE post op shoe      Mobility  Bed Mobility Overal bed mobility: Modified Independent              General bed mobility comments: supine<>sit with HOB slightly elevated    Transfers Overall transfer level: Modified independent   Transfers: Sit to/from Stand           General transfer comment: Pt elects to pull up to standing by holding to counter/sink  Ambulation/Gait Ambulation/Gait assistance: Supervision Gait Distance (Feet): 18 Feet Assistive device: None Gait Pattern/deviations: Decreased step length - left;Decreased step length - right;Decreased stride length Gait velocity: decreased   General Gait Details: Pt initially furniture walking, holding to bed, tray, etc then able to ambulate a few steps without UE support & supervision.  Stairs            Wheelchair Mobility    Modified Rankin (Stroke Patients Only)       Balance Overall balance assessment: Needs assistance Sitting-balance support: Feet supported;No upper extremity supported Sitting balance-Leahy Scale: Normal     Standing balance support: No upper extremity supported Standing balance-Leahy Scale: Fair                               Pertinent Vitals/Pain Pain Assessment: No/denies pain    Home Living Family/patient expects to be discharged to:: Private residence Living Arrangements: Non-relatives/Friends ("housemate") Available Help at Discharge: Available 24 hours/day ("housemate") Type of Home: House Home Access: Stairs to enter Entrance Stairs-Rails: Right;Left (wideset) Entrance Stairs-Number of Steps: 4 Home Layout: One level Home Equipment: Cane - single point;Wheelchair - Insurance claims handler - 4 wheels      Prior Function Level of Independence: Independent with assistive device(s)  Comments: Reports he uses a SPC "when I need it"     Hand Dominance        Extremity/Trunk Assessment   Upper Extremity Assessment Upper Extremity Assessment: Overall WFL for tasks assessed    Lower Extremity Assessment Lower Extremity Assessment:  Generalized weakness       Communication   Communication: No difficulties  Cognition Arousal/Alertness: Awake/alert Behavior During Therapy: WFL for tasks assessed/performed Overall Cognitive Status: Within Functional Limits for tasks assessed                                 General Comments: Verbose      General Comments General comments (skin integrity, edema, etc.): Pt on supplemental O2 while lying in bed - reports he's using this instead of CPAP while in hospital, no O2 needs otherwise & pt does not use it when ambulating/sitting during session    Exercises     Assessment/Plan    PT Assessment Patient needs continued PT services  PT Problem List Decreased mobility;Decreased balance;Decreased knowledge of use of DME;Decreased activity tolerance       PT Treatment Interventions DME instruction;Therapeutic activities;Modalities;Gait training;Therapeutic exercise;Balance training;Stair training;Patient/family education;Functional mobility training;Neuromuscular re-education;Manual techniques    PT Goals (Current goals can be found in the Care Plan section)  Acute Rehab PT Goals Patient Stated Goal: none stated PT Goal Formulation: With patient Time For Goal Achievement: 10/16/20 Potential to Achieve Goals: Good    Frequency Min 2X/week   Barriers to discharge        Co-evaluation               AM-PAC PT "6 Clicks" Mobility  Outcome Measure Help needed turning from your back to your side while in a flat bed without using bedrails?: None Help needed moving from lying on your back to sitting on the side of a flat bed without using bedrails?: None Help needed moving to and from a bed to a chair (including a wheelchair)?: None Help needed standing up from a chair using your arms (e.g., wheelchair or bedside chair)?: None Help needed to walk in hospital room?: A Little Help needed climbing 3-5 steps with a railing? : A Little 6 Click Score: 22     End of Session Equipment Utilized During Treatment: Gait belt Activity Tolerance: Other (comment) (pt self limits due to not having shoe & uneveness with L post op shoe donned) Patient left: with call bell/phone within reach (sitting EOB) Nurse Communication: Mobility status PT Visit Diagnosis: Unsteadiness on feet (R26.81);Muscle weakness (generalized) (M62.81)    Time: 3149-7026 PT Time Calculation (min) (ACUTE ONLY): 22 min   Charges:   PT Evaluation $PT Eval Low Complexity: [place], PT, DPT 10/02/20, 3:50 PM   Waunita Schooner 10/02/2020, 3:39 PM

## 2020-10-03 ENCOUNTER — Other Ambulatory Visit (INDEPENDENT_AMBULATORY_CARE_PROVIDER_SITE_OTHER): Payer: Self-pay | Admitting: Vascular Surgery

## 2020-10-03 DIAGNOSIS — M79672 Pain in left foot: Secondary | ICD-10-CM

## 2020-10-03 DIAGNOSIS — I10 Essential (primary) hypertension: Secondary | ICD-10-CM

## 2020-10-03 DIAGNOSIS — S9032XA Contusion of left foot, initial encounter: Secondary | ICD-10-CM

## 2020-10-03 DIAGNOSIS — E118 Type 2 diabetes mellitus with unspecified complications: Secondary | ICD-10-CM

## 2020-10-03 LAB — CBC
HCT: 31.1 % — ABNORMAL LOW (ref 39.0–52.0)
Hemoglobin: 10 g/dL — ABNORMAL LOW (ref 13.0–17.0)
MCH: 31.7 pg (ref 26.0–34.0)
MCHC: 32.2 g/dL (ref 30.0–36.0)
MCV: 98.7 fL (ref 80.0–100.0)
Platelets: 123 10*3/uL — ABNORMAL LOW (ref 150–400)
RBC: 3.15 MIL/uL — ABNORMAL LOW (ref 4.22–5.81)
RDW: 16 % — ABNORMAL HIGH (ref 11.5–15.5)
WBC: 5.6 10*3/uL (ref 4.0–10.5)
nRBC: 0 % (ref 0.0–0.2)

## 2020-10-03 LAB — BASIC METABOLIC PANEL
Anion gap: 10 (ref 5–15)
BUN: 82 mg/dL — ABNORMAL HIGH (ref 8–23)
CO2: 29 mmol/L (ref 22–32)
Calcium: 8.9 mg/dL (ref 8.9–10.3)
Chloride: 97 mmol/L — ABNORMAL LOW (ref 98–111)
Creatinine, Ser: 2.51 mg/dL — ABNORMAL HIGH (ref 0.61–1.24)
GFR, Estimated: 27 mL/min — ABNORMAL LOW (ref >60)
Glucose, Bld: 200 mg/dL — ABNORMAL HIGH (ref 70–99)
Potassium: 4.1 mmol/L (ref 3.5–5.1)
Sodium: 136 mmol/L (ref 135–145)

## 2020-10-03 LAB — SEDIMENTATION RATE: Sed Rate: 86 mm/hr — ABNORMAL HIGH (ref 0–20)

## 2020-10-03 LAB — GLUCOSE, CAPILLARY
Glucose-Capillary: 120 mg/dL — ABNORMAL HIGH (ref 70–99)
Glucose-Capillary: 207 mg/dL — ABNORMAL HIGH (ref 70–99)
Glucose-Capillary: 130 mg/dL — ABNORMAL HIGH (ref 70–99)
Glucose-Capillary: 150 mg/dL — ABNORMAL HIGH (ref 70–99)

## 2020-10-03 MED ORDER — GLIPIZIDE 10 MG PO TABS
10.0000 mg | ORAL_TABLET | Freq: Every day | ORAL | Status: DC
  Filled 2020-10-03 (×2): qty 1

## 2020-10-03 MED ORDER — BISACODYL 10 MG RE SUPP
10.0000 mg | Freq: Once | RECTAL | Status: AC
  Administered 2020-10-03: 10 mg via RECTAL
  Filled 2020-10-03: qty 1

## 2020-10-03 MED ORDER — FLEET ENEMA 7-19 GM/118ML RE ENEM: 1.0000 | ENEMA | Freq: Every day | RECTAL | Status: DC | PRN

## 2020-10-03 MED ORDER — SODIUM CHLORIDE 0.9 % IV SOLN: INTRAVENOUS | Status: DC

## 2020-10-03 NOTE — Progress Notes (Signed)
Patient declined to have wound cleaned this shift.

## 2020-10-03 NOTE — Progress Notes (Signed)
  Subjective:  Patient ID: [name], male    DOB: 01-19-1951,  MRN: [id number]  Feeling well minimal pain  Denies fevers chills nausea vomiting Objective:   Vitals:   10/03/20 0729 10/03/20 1115  BP: 104/60 112/68  Pulse: 64 66  Resp: 20 16  Temp: (!) 97.5 F (36.4 C) 97.8 F (36.6 C)  SpO2: 100% 99%   General AA&O x3. Normal mood and affect.  Vascular Dorsalis pedis and posterior tibial pulses nonpalpable significant edema  Neurologic Epicritic sensation grossly reduced.  Dermatologic  incisions well coapted there is some blistering over the dorsal skin bridge but no necrosis identified, still has persistent erythema  Orthopedic:           Assessment & Plan:  Patient was evaluated and treated and all questions answered.   -WBAT in surgical shoe to LLE -Can discontinue packing and change daily with gauze ABD Kerlix and Ace wrap -I do think 7 to 10 days worth of an oral antibiotic would be advisable with his persistent erythema and his culture was taken while he was on broad-spectrum antibiotics for several days before that -Continue dressing changes at home after discharge -No further surgical plans -Follow-up with me 1 week after discharge at my [place] office  Criselda Peaches, DPM  Accessible via secure chat for questions or concerns.

## 2020-10-03 NOTE — Plan of Care (Signed)

## 2020-10-03 NOTE — Progress Notes (Signed)
Inpatient Diabetes Program Recommendations  AACE/ADA: New Consensus Statement on Inpatient Glycemic Control (2015)  Target Ranges:  Prepandial:   less than 140 mg/dL      Peak postprandial:   less than 180 mg/dL (1-2 hours)      Critically ill patients:  140 - 180 mg/dL   Lab Results  Component Value Date   GLUCAP 120 (H) 10/03/2020   HGBA1C 7.4 (H) 09/27/2020    Review of Glycemic Control Results for [name], [name] (MRN [id number]) as of 10/03/2020 11:08  Ref. Range 10/02/2020 07:57 10/02/2020 11:48 10/02/2020 16:34 10/02/2020 21:11 10/03/2020 07:30  Glucose-Capillary Latest Ref Range: 70 - 99 mg/dL 61 (L) 196 (H) 184 (H) 199 (H) 120 (H)   Diabetes history: DM 2 Outpatient Diabetes medications:  Dulaglutide 1.5 mg weekly, Glipizide 10 mg bid, Toujeo 28 units daily, Jardiance 10 mg q PM Current orders for Inpatient glycemic control:  Novolog sensitive tid with meals and HS Jardiance 10 mg daily Glucotrol 10 mg bid Inpatient Diabetes Program Recommendations:   Note low fasting CBG.  Consider d/c of PM dose of Glucotrol?    Thanks,  Adah Perl, RN, BC-ADM Inpatient Diabetes Coordinator Pager [phone] (8a-5p)

## 2020-10-03 NOTE — TOC Progression Note (Signed)
Transition of Care [facility]) - Progression Note    Patient Details  Name: [name] MRN: [id number] Date of Birth: Nov 08, 1950  Transition of Care [facility]) CM/SW [place], LCSW Phone Number: 10/03/2020, 11:32 AM  Clinical Narrative: Per RN, patient declined DME recommendation for rolling walker.    Expected Discharge Plan: Home/Self Care Barriers to Discharge: Continued Medical Work up  Expected Discharge Plan and Services Expected Discharge Plan: Home/Self Care       Living arrangements for the past 2 months: Single Family Home                                       Social Determinants of Health (SDOH) Interventions    Readmission Risk Interventions Readmission Risk Prevention Plan 09/28/2020 12/11/2019  Transportation Screening Complete Complete  PCP or Specialist Appt within 3-5 Days Complete Complete  Social Work Consult for [place] Planning/Counseling Complete Complete  Palliative Care Screening Not Applicable Not Applicable  Medication Review [employer] photographer) Complete Complete  Some recent data might be hidden

## 2020-10-03 NOTE — Care Management Important Message (Signed)
Important Message  Patient Details  Name: Bingham Millette MRN: [id number] Date of Birth: 1951-02-09   Medicare Important Message Given:  Yes     Dannette Barbara 10/03/2020, 11:08 AM

## 2020-10-03 NOTE — Progress Notes (Signed)
[facility] [place] NAME: [name]    MR#:  [id number]  DATE OF BIRTH:  1950-11-23  SUBJECTIVE:   Patient denies any new complaints. He sitting at the edge of the bed eating lunch. Earlier seen by podiatrist Dr. Sherryle Lis. Patient is undergoing angiogram of his left foot tomorrow. REVIEW OF SYSTEMS:   Review of Systems  Constitutional: Negative for chills, fever and weight loss.  HENT: Negative for ear discharge, ear pain and nosebleeds.   Eyes: Negative for blurred vision, pain and discharge.  Respiratory: Negative for sputum production, shortness of breath, wheezing and stridor.   Cardiovascular: Negative for chest pain, palpitations, orthopnea and PND.  Gastrointestinal: Negative for abdominal pain, diarrhea, nausea and vomiting.  Genitourinary: Negative for frequency and urgency.  Musculoskeletal: Negative for back pain and joint pain.  Neurological: Negative for sensory change, speech change, focal weakness and weakness.  Psychiatric/Behavioral: Negative for depression and hallucinations. The patient is not nervous/anxious.    Tolerating Diet:yes Tolerating PT: HHPT  DRUG ALLERGIES:   Allergies  Allergen Reactions  . Other Hives and Other (See Comments)    Blue cheese Blue cheese Blue cheese    VITALS:  Blood pressure 112/68, pulse 66, temperature 97.8 F (36.6 C), temperature source Oral, resp. rate 16, height 6' (1.829 m), weight 113.4 kg, SpO2 99 %.  PHYSICAL EXAMINATION:   Physical Exam  GENERAL:  70 y.o.-year-old patient lying in the bed with no acute distress.  LUNGS: Normal breath sounds bilaterally, no wheezing, rales, rhonchi. No use of accessory muscles of respiration.  CARDIOVASCULAR: S1, S2 normal. No murmurs, rubs, or gallops.  ABDOMEN: Soft, nontender, nondistended. Bowel sounds present. No organomegaly or mass.  EXTREMITIES:      NEUROLOGIC: Cranial nerves II through XII are intact. No focal Motor or  sensory deficits b/l.   PSYCHIATRIC:  patient is alert and oriented x 3.  SKIN:as above  LABORATORY PANEL:  CBC Recent Labs  Lab 10/03/20 0359  WBC 5.6  HGB 10.0*  HCT 31.1*  PLT 123*    Chemistries  Recent Labs  Lab 09/26/20 1717 09/27/20 0600 10/01/20 0429 10/02/20 0506 10/03/20 0359  NA 135   < > 137   < > 136  K 3.9   < > 4.0   < > 4.1  CL 94*   < > 100   < > 97*  CO2 27   < > 27   < > 29  GLUCOSE 171*   < > 90   < > 200*  BUN 38*   < > 62*   < > 82*  CREATININE 1.97*   < > 1.99*   < > 2.51*  CALCIUM 9.1   < > 8.9   < > 8.9  MG  --    < > 2.3  --   --   AST 23  --   --   --   --   ALT 20  --   --   --   --   ALKPHOS 86  --   --   --   --   BILITOT 1.0  --   --   --   --    < > = values in this interval not displayed.   Cardiac Enzymes No results for input(s): TROPONINI in the last 168 hours. RADIOLOGY:  No results found. ASSESSMENT AND PLAN:  Cedarius Kersh [name] a 70 y.o.Caucasian malewith medical history significant forcoronary  artery disease, status post PCI and stents, type 2 diabetes mellitus, stage IIIb chronic kidney disease, hypertension, ischemic cardiomyopathy, paroxysmal atrial fibrillation on Eliquis and sleep apnea, who presented to the emergency room with acute onset of worsening leftfootswelling with associated pain and discoloration, warmth and tenderness after walking fromawh[place] shopping on Saturday.   Left foot hematoma with inflammation and possible  infection  -Blood culture no growth, foot x-ray/mri  did not show osteomyelitis -did have lactic acidosis on presentation, lactic acid 2.7>> 1.4 -podiatry consulted,s/p I/d on 4/29, culture no growth, per podiatry op note, wound does not appear infected -was on iV Vanco and cefepime, then linezolid and cefepime, finished total of 7 days treatment -vascular surgery consulted as well, plan for angiogram on Tuesday, need to hold eliquis on Monday and Tuesday prior to procedure, resume when  ok with vascular -podiatry/vascular surgery input appreciated   Insulin-dependent type 2 diabetes, uncontrolled, A1c 7.4, with hyperglycemia and hypoglycemia -he does not want to discontinue glipizide, he agreed to discontinue lantus and continue the rest of diabetic meds--for now cont jardiance and glipzide  Paroxysmal A. Fib Currently in sinus rhythm, continue Coreg, amiodarone, Eliquis (need to hold for vascular procedure on monday)  Chronic systolic heart failure Euvolemic Continue home medication Coreg, Jardiance, losartan -continue home diuretic torsemide and metolazone  Hypokalemia  K replaced   CKD 3B Renal function appears at baseline  Constipation Senokot MiraLAX    The patient's BMI is: Body mass index is 33.91 kg/m.[name]   Procedures: I and D left foot--evacuation of hematoma Family communication :none today Consults :POdiatry, vascular CODE STATUS: Full DVT Prophylaxis :eliquis (on hold) Level of care: Med-Surg Status is: Inpatient  Remains inpatient appropriate because:Inpatient level of care appropriate due to severity of illness   Dispo: The patient is from: Home              Anticipated d/c is to: Home              Patient currently is medically stable to d/c.   Difficult to place patient No  Discharge after Angiogram tomorrow      TOTAL TIME TAKING CARE OF THIS PATIENT: 25 minutes.  >50% time spent on counselling and coordination of care  Note: This dictation was prepared with Dragon dictation along with smaller phrase technology. Any transcriptional errors that result from this process are unintentional.  Fritzi Mandes M.D    [facility]   CC: Primary care physician; Vidal Schwalbe, MDPatient ID: Rosalie Doctor, male   DOB: 05-18-1951, 70 y.o.   MRN: [id number]

## 2020-10-03 NOTE — Anesthesia Postprocedure Evaluation (Signed)
Anesthesia Post Note  Patient: [name]  Procedure(s) Performed: IRRIGATION AND DEBRIDEMENT FOOT (Left )  Patient location during evaluation: PACU Anesthesia Type: General Level of consciousness: awake and alert and oriented Pain management: pain level controlled Vital Signs Assessment: post-procedure vital signs reviewed and stable Respiratory status: spontaneous breathing Cardiovascular status: blood pressure returned to baseline Anesthetic complications: no   No complications documented.   Last Vitals:  Vitals:   10/03/20 0525 10/03/20 0729  BP: 115/79 104/60  Pulse: 66 64  Resp: 18 20  Temp: 36.6 C (!) 36.4 C  SpO2: 100% 100%    Last Pain:  Vitals:   10/03/20 0612  TempSrc:   PainSc: 0-No pain                 Shelia Magallon

## 2020-10-04 ENCOUNTER — Encounter: Payer: Self-pay | Admitting: Vascular Surgery

## 2020-10-04 ENCOUNTER — Encounter
Admission: EM | Disposition: A | Discharge status: Home / Self Care | Payer: Self-pay | Source: Home / Self Care | Attending: Internal Medicine

## 2020-10-04 DIAGNOSIS — I70245 Atherosclerosis of native arteries of left leg with ulceration of other part of foot: Secondary | ICD-10-CM

## 2020-10-04 HISTORY — PX: LOWER EXTREMITY ANGIOGRAPHY: CATH118251

## 2020-10-04 LAB — BASIC METABOLIC PANEL
Anion gap: 13 (ref 5–15)
BUN: 87 mg/dL — ABNORMAL HIGH (ref 8–23)
CO2: 31 mmol/L (ref 22–32)
Calcium: 9 mg/dL (ref 8.9–10.3)
Chloride: 93 mmol/L — ABNORMAL LOW (ref 98–111)
Creatinine, Ser: 2.4 mg/dL — ABNORMAL HIGH (ref 0.61–1.24)
GFR, Estimated: 29 mL/min — ABNORMAL LOW (ref >60)
Glucose, Bld: 229 mg/dL — ABNORMAL HIGH (ref 70–99)
Potassium: 3.1 mmol/L — ABNORMAL LOW (ref 3.5–5.1)
Sodium: 137 mmol/L (ref 135–145)

## 2020-10-04 LAB — CBC
HCT: 32.6 % — ABNORMAL LOW (ref 39.0–52.0)
Hemoglobin: 10.8 g/dL — ABNORMAL LOW (ref 13.0–17.0)
MCH: 31.8 pg (ref 26.0–34.0)
MCHC: 33.1 g/dL (ref 30.0–36.0)
MCV: 95.9 fL (ref 80.0–100.0)
Platelets: 166 10*3/uL (ref 150–400)
RBC: 3.4 MIL/uL — ABNORMAL LOW (ref 4.22–5.81)
RDW: 15.9 % — ABNORMAL HIGH (ref 11.5–15.5)
WBC: 5.7 10*3/uL (ref 4.0–10.5)
nRBC: 0 % (ref 0.0–0.2)

## 2020-10-04 LAB — GLUCOSE, CAPILLARY
Glucose-Capillary: 112 mg/dL — ABNORMAL HIGH (ref 70–99)
Glucose-Capillary: 84 mg/dL (ref 70–99)
Glucose-Capillary: 99 mg/dL (ref 70–99)

## 2020-10-04 LAB — C-REACTIVE PROTEIN: CRP: 9.3 mg/dL — ABNORMAL HIGH (ref <1.0)

## 2020-10-04 SURGERY — LOWER EXTREMITY ANGIOGRAPHY
Anesthesia: Moderate Sedation | Laterality: Left

## 2020-10-04 MED ORDER — SODIUM CHLORIDE 0.9 % IV SOLN: INTRAVENOUS | Status: AC

## 2020-10-04 MED ORDER — ONDANSETRON HCL 4 MG/2ML IJ SOLN: 4.0000 mg | Freq: Four times a day (QID) | INTRAMUSCULAR | Status: DC | PRN

## 2020-10-04 MED ORDER — MIDAZOLAM HCL 5 MG/5ML IJ SOLN
INTRAMUSCULAR | Status: AC
  Filled 2020-10-04: qty 5

## 2020-10-04 MED ORDER — FENTANYL CITRATE (PF) 100 MCG/2ML IJ SOLN
INTRAMUSCULAR | Status: AC
  Filled 2020-10-04: qty 2

## 2020-10-04 MED ORDER — SODIUM CHLORIDE 0.9% FLUSH: 3.0000 mL | INTRAVENOUS | Status: DC | PRN

## 2020-10-04 MED ORDER — DIPHENHYDRAMINE HCL 50 MG/ML IJ SOLN: 50.0000 mg | Freq: Once | INTRAMUSCULAR | Status: DC | PRN

## 2020-10-04 MED ORDER — HEPARIN SODIUM (PORCINE) 1000 UNIT/ML IJ SOLN
INTRAMUSCULAR | Status: AC
  Filled 2020-10-04: qty 1

## 2020-10-04 MED ORDER — SODIUM CHLORIDE 0.9 % IV SOLN: INTRAVENOUS | Status: DC

## 2020-10-04 MED ORDER — SODIUM CHLORIDE 0.9% FLUSH: 3.0000 mL | Freq: Two times a day (BID) | INTRAVENOUS | Status: DC

## 2020-10-04 MED ORDER — MIDAZOLAM HCL 2 MG/ML PO SYRP
8.0000 mg | ORAL_SOLUTION | Freq: Once | ORAL | Status: DC | PRN
  Filled 2020-10-04: qty 4

## 2020-10-04 MED ORDER — IODIXANOL 320 MG/ML IV SOLN
INTRAVENOUS | Status: DC | PRN
  Administered 2020-10-04: 40 mL

## 2020-10-04 MED ORDER — FAMOTIDINE 20 MG PO TABS: 40.0000 mg | ORAL_TABLET | Freq: Once | ORAL | Status: DC | PRN

## 2020-10-04 MED ORDER — MIDAZOLAM HCL 2 MG/2ML IJ SOLN
INTRAMUSCULAR | Status: DC | PRN
  Administered 2020-10-04: 1 mg via INTRAVENOUS
  Administered 2020-10-04: 2 mg via INTRAVENOUS

## 2020-10-04 MED ORDER — GLIPIZIDE 10 MG PO TABS
10.0000 mg | ORAL_TABLET | Freq: Every day | ORAL | 0 refills | Status: DC
Start: 2020-10-04 — End: 2020-10-27

## 2020-10-04 MED ORDER — POTASSIUM CHLORIDE 10 MEQ/100ML IV SOLN
10.0000 meq | INTRAVENOUS | Status: AC
  Administered 2020-10-04: 10 meq via INTRAVENOUS
  Filled 2020-10-04: qty 100

## 2020-10-04 MED ORDER — HYDROMORPHONE HCL 1 MG/ML IJ SOLN: 1.0000 mg | Freq: Once | INTRAMUSCULAR | Status: DC | PRN

## 2020-10-04 MED ORDER — SODIUM CHLORIDE 0.9 % IV SOLN
1.0000 g | Freq: Once | INTRAVENOUS | Status: DC
  Filled 2020-10-04: qty 10

## 2020-10-04 MED ORDER — FENTANYL CITRATE (PF) 100 MCG/2ML IJ SOLN
INTRAMUSCULAR | Status: DC | PRN
  Administered 2020-10-04: 25 ug via INTRAVENOUS
  Administered 2020-10-04: 50 ug via INTRAVENOUS

## 2020-10-04 MED ORDER — SODIUM CHLORIDE 0.9 % IV SOLN
250.0000 mL | INTRAVENOUS | Status: DC | PRN
Start: 2020-10-04 — End: 2020-10-04

## 2020-10-04 MED ORDER — METHYLPREDNISOLONE SODIUM SUCC 125 MG IJ SOLR: 125.0000 mg | Freq: Once | INTRAMUSCULAR | Status: DC | PRN

## 2020-10-04 MED ORDER — OXYCODONE HCL 5 MG PO TABS: 5.0000 mg | ORAL_TABLET | ORAL | Status: DC | PRN

## 2020-10-04 MED ORDER — MORPHINE SULFATE (PF) 4 MG/ML IV SOLN: 2.0000 mg | INTRAVENOUS | Status: DC | PRN

## 2020-10-04 SURGICAL SUPPLY — 13 items
CANNULA 5F STIFF (CANNULA) ×1 IMPLANT
CATH ANGIO 5F PIGTAIL 65CM (CATHETERS) ×1 IMPLANT
COVER PROBE U/S 5X48 (MISCELLANEOUS) ×1 IMPLANT
DEVICE STARCLOSE SE CLOSURE (Vascular Products) ×1 IMPLANT
DRAPE BRACHIAL (DRAPES) IMPLANT
GLIDEWIRE ADV .035X260CM (WIRE) ×1 IMPLANT
PACK ANGIOGRAPHY (CUSTOM PROCEDURE TRAY) ×2 IMPLANT
SHEATH BRITE TIP 5FRX11 (SHEATH) ×1 IMPLANT
SHEATH BRITE TIP 6FRX5.5 (SHEATH) IMPLANT
SUT MNCRL AB 4-0 PS2 18 (SUTURE) IMPLANT
SYR MEDRAD MARK 7 150ML (SYRINGE) ×1 IMPLANT
TUBING CONTRAST HIGH PRESS 48 (TUBING) ×2 IMPLANT
WIRE GUIDERIGHT .035X150 (WIRE) ×2 IMPLANT

## 2020-10-04 NOTE — Discharge Instructions (Addendum)
PEr Dr Eual Fines: Can discontinue packing and change daily with gauze ABD Kerlix and Ace wrap. F/u [employer] office next [employer]

## 2020-10-04 NOTE — Discharge Summary (Signed)
[facility] NAME: [name]    MR#:  [id number]  DATE OF BIRTH:  07/28/1950  DATE OF ADMISSION:  09/26/2020 ADMITTING PHYSICIAN: Jim Mormon, MD  DATE OF DISCHARGE: 10/04/2020  PRIMARY CARE PHYSICIAN: Jim Schwalbe, MD    ADMISSION DIAGNOSIS:  Cellulitis in diabetic foot ([place]) [E11.628, O67.124] Foot pain, left [M79.672] Cellulitis, unspecified cellulitis site [L03.90]  DISCHARGE DIAGNOSIS:  Left foot Hematoma s/p I and D and evacuation of blood clot with washout Left foot cellulitis--completed abxs CKD -IIIB Paroxysmal Afib on po eliquis SECONDARY DIAGNOSIS:   Past Medical History:  Diagnosis Date  . CKD (chronic kidney disease) stage 3, GFR 30-59 ml/min (HCC)   . Coronary artery disease    a. 1998 s/p ACS Multi-link BMS to the prox LAD ([place]); b. 07/2015 Cath: LM nl, LAD patent stent, LCX min irregs, OM1/2 min irregs, OM3 nl, RCA min irregs; c. 07/2020 Cath: LM nl, LAD 50p/m, 70d, D2 70, LCX 40p/41m, OM3 40, RCA 60d->Med Rx.  . Diabetes mellitus type 2, uncontrolled ([place])    a. A1c 11.0 in 07/2015.  [name] HFrEF    a. EF 25% by cath in 2013; b. Echo in 07/2015 showing EF of 15-20%, moderate MR, moderate Pulm HTN, severely dilated IVC; c. 11/2016 Echo: EF 55-60%, no rwma, Gr1 DD, mildly dil LA, nl RV fxn. d. 12/2019 Echo: EF 40-45%, e. 07/2020 Echo: EF 20-25%, sev red RV fxn, mod dil LA, mild-mod MR.  [name] History of kidney stones   . Hypertension   . Ischemic cardiomyopathy   . Kidney stones    Left  . Paroxysmal atrial fibrillation ([place])    a. Dx 07/2015; b. CHA2DS2VASc = 5-->eliquis. Rhythm control w/ amiodarone.  . Pollen allergies 09-21-15   pt called and stated that he woke up and had some drainage-pt states he did not see the color of the drainage-pt denies running a fever and this only happened once-pt instructed to call [name] office if he starts running a fever or if the color of drainage becomes yellow/green   . Psoriasis   . Pulmonary hypertension ([place])   . Renal disorder    kidney stone  . Sleep apnea     HOSPITAL COURSE:  [name] a 70 y.o.Caucasian malewith medical history significant forcoronary artery disease, status post PCI and stents, type 2 diabetes mellitus, stage IIIb chronic kidney disease, hypertension, ischemic cardiomyopathy, paroxysmal atrial fibrillation on Eliquis and sleep apnea, who presented to the emergency room with acute onset of worsening leftfootswelling with associated pain and discoloration, warmth and tenderness after walking fromawhile shopping on Saturday.   Left foot hematoma with inflammation and possibleinfection  -Blood culture no growth, foot x-ray/mri did not show osteomyelitis -did have lactic acidosis on presentation, lactic acid 2.7>> 1.4 -podiatry consulted,s/p I/d on 4/29, culture no growth, per podiatry op note, wound does not appear infected -was on iV Vanco and cefepime, then linezolid and cefepime, finished total of 7 days treatment -vascular surgery input appreicated--diagnostic Angigraom did not show any blockage. Ok to resume eliquis -podiatry/vascular surgeryinput appreciated --f/u [name] and f/u dressing instructions. Pt DOE not want HH  Insulin-dependent type 2 diabetes, uncontrolled, A1c 7.4, with hyperglycemiaand hypoglycemia -- he agreed to discontinue lantus and continue the rest of diabetic meds--for now cont jardiance and glipzide  Paroxysmal A. Fib Currently in sinus rhythm, continue Coreg, amiodarone, Eliquis--now resumed at d/c  Chronic systolic heart failure Euvolemic -Continue  home medication Coreg, Jardiance, losartan -continue home diuretic torsemide , metolazone  Hypokalemia K being replaced  CKD 3B Renal function appears between 1.9--2.4 --good UOP --f/u Jim [name] nephrology at out pt  Constipation Senokot MiraLAX, dulcolax    The patient's BMI is:Body mass index  is 33.91 kg/m.[name]   Procedures: I and D left foot--evacuation of hematoma Left LE angiogram Family communication :none today Consults :POdiatry, vascular CODE STATUS: Full DVT Prophylaxis :eliquis (on hold) Level of care: Med-Surg Status is: Inpatient    Dispo: The patient is from: Home  Anticipated d/c is to: Home  Patient currently is medically stable to d/c.              Difficult to place patient No  D/c plans d/w pt and in agreement with it   CONSULTS OBTAINED:  Treatment Team:  Jim Cabal, MD  DRUG ALLERGIES:   Allergies  Allergen Reactions  . Other Hives and Other (See Comments)    Blue cheese Blue cheese Blue cheese    DISCHARGE MEDICATIONS:   Allergies as of 10/04/2020      Reactions   Other Hives, Other (See Comments)   Blue cheese Blue cheese Blue cheese      Medication List    STOP taking these medications   insulin glargine (2 Unit Dial) 300 UNIT/ML Solostar Pen Commonly known as: TOUJEO MAX     TAKE these medications   amiodarone 200 MG tablet Commonly known as: PACERONE Take 1 tablet (200 mg total) by mouth daily.   apixaban 5 MG Tabs tablet Commonly known as: Eliquis Take 1 tablet (5 mg total) 2 (two) times daily by mouth.   BROMELAIN PO Take 2 capsules by mouth daily.   carvedilol 6.25 MG tablet Commonly known as: COREG Take 1 tablet (6.25 mg total) by mouth 2 (two) times daily with a meal.   CINNAMON PO Take 1,200 mg by mouth in the morning and at bedtime. [place] Cinnamon 1200mg    clonazePAM 1 MG tablet Commonly known as: KLONOPIN Take 1 mg by mouth See admin instructions. Take 1 tablet (1 mg) by mouth scheduled at bedtime, may take an additional dose during the day if needed for anxiety   CoQ10 400 MG Caps Take 400 mg by mouth 2 (two) times daily.   docusate sodium 100 MG capsule Commonly known as: COLACE Take 300 mg in the am & 200 mg in the pm.   Dulaglutide 1.5 MG/0.5ML  Sopn Inject 1.5 mg into the skin once a week. (Monday Nights)   glipiZIDE 10 MG tablet Commonly known as: GLUCOTROL Take 1 tablet (10 mg total) by mouth daily before breakfast. What changed: when to take this   HAWTHORNE BERRY PO Take 565 mg by mouth at bedtime.   Jardiance 10 MG Tabs tablet Generic drug: empagliflozin Take 10 mg by mouth every evening.   levothyroxine 50 MCG tablet Commonly known as: SYNTHROID Take by mouth daily at 12 noon.   losartan 25 MG tablet Commonly known as: COZAAR Take 0.5 tablets (12.5 mg total) by mouth daily.   magnesium oxide 400 (241.3 Mg) MG tablet Commonly known as: MAG-OX Take 1 tablet (400 mg total) by mouth daily.   MELATONIN-THEANINE PO Take 2 capsules by mouth at bedtime. Olly Sleep Gummies   metolazone 2.5 MG tablet Commonly known as: ZAROXOLYN Take 1 tablet (2.5 mg total) by mouth every morning. Along with your morning dose of Torsemide. What changed: when to take this   potassium chloride  SA 20 MEQ tablet Commonly known as: KLOR-CON Take 2 tablets (40 mEq total) by mouth daily.   PRESERVISION AREDS 2 PO Take 1 tablet by mouth in the morning and at bedtime.   psyllium 0.52 g capsule Commonly known as: REGULOID Take 2.6 g by mouth 2 (two) times daily. 5 capsules in the morning and 3 capsule at night time   rosuvastatin 20 MG tablet Commonly known as: CRESTOR Take 20 mg by mouth every evening.   Torsemide 40 MG Tabs Take 40 mg by mouth daily.   valACYclovir 1000 MG tablet Commonly known as: VALTREX Take 1,000 mg by mouth daily as needed.   vitamin C 500 MG tablet Commonly known as: ASCORBIC ACID Take 500 mg by mouth at bedtime.            Discharge Care Instructions  (From admission, onward)         Start     Ordered   10/04/20 0000  Discharge wound care:       Comments: Dressing change with gauze daily and Ace wrap --per Podiatry   10/04/20 1227          If you experience worsening of your  admission symptoms, develop shortness of breath, life threatening emergency, suicidal or homicidal thoughts you must seek medical attention immediately by calling 911 or calling your MD immediately  if symptoms less severe.  You Must read complete instructions/literature along with all the possible adverse reactions/side effects for all the Medicines you take and that have been prescribed to you. Take any new Medicines after you have completely understood and accept all the possible adverse reactions/side effects.   Please note  You were cared for by a hospitalist during your hospital stay. If you have any questions about your discharge medications or the care you received while you were in the hospital after you are discharged, you can call the unit and asked to speak with the hospitalist on call if the hospitalist that took care of you is not available. Once you are discharged, your primary care physician will handle any further medical issues. Please note that NO REFILLS for any discharge medications will be authorized once you are discharged, as it is imperative that you return to your primary care physician (or establish a relationship with a primary care physician if you do not have one) for your aftercare needs so that they can reassess your need for medications and monitor your lab values. Today   SUBJECTIVE   No new complaints  VITAL SIGNS:  Blood pressure 113/60, pulse 64, temperature 97.6 F (36.4 C), temperature source Oral, resp. rate 16, height 6' (1.829 m), weight 113.4 kg, SpO2 100 %.  I/O:    Intake/Output Summary (Last 24 hours) at 10/04/2020 1230 Last data filed at 10/04/2020 1144 Gross per 24 hour  Intake 960 ml  Output 2930 ml  Net -1970 ml    PHYSICAL EXAMINATION:  GENERAL:  70 y.o.-year-old patient lying in the bed with no acute distress.  LUNGS: Normal breath sounds bilaterally, no wheezing, rales,rhonchi or crepitation. No use of accessory muscles of respiration.   CARDIOVASCULAR: S1, S2 normal. No murmurs, rubs, or gallops.  ABDOMEN: Soft, non-tender, non-distended. Bowel sounds present. No organomegaly or mass.  EXTREMITIES: foot 2 days a  go NEUROLOGIC: Cranial nerves II through XII are intact. Muscle strength 5/5 in all extremities. Sensation intact. Gait not checked.  PSYCHIATRIC: The patient is alert and oriented x 3.  SKIN: as above  DATA  REVIEW:   CBC  Recent Labs  Lab 10/04/20 0442  WBC 5.7  HGB 10.8*  HCT 32.6*  PLT 166    Chemistries  Recent Labs  Lab 10/01/20 0429 10/02/20 0506 10/04/20 0442  NA 137   < > 137  K 4.0   < > 3.1*  CL 100   < > 93*  CO2 27   < > 31  GLUCOSE 90   < > 229*  BUN 62*   < > 87*  CREATININE 1.99*   < > 2.40*  CALCIUM 8.9   < > 9.0  MG 2.3  --   --    < > = values in this interval not displayed.    Microbiology Results   Recent Results (from the past 240 hour(s))  Resp Panel by RT-PCR (Flu A&B, Covid) Nasopharyngeal Swab     Status: None   Collection Time: 09/26/20  8:02 PM   Specimen: Nasopharyngeal Swab; Nasopharyngeal(NP) swabs in vial transport medium  Result Value Ref Range Status   SARS Coronavirus 2 by RT PCR NEGATIVE NEGATIVE Final    Comment: (NOTE) SARS-CoV-2 target nucleic acids are NOT DETECTED.  The SARS-CoV-2 RNA is generally detectable in upper respiratory specimens during the acute phase of infection. The lowest concentration of SARS-CoV-2 viral copies this assay can detect is 138 copies/mL. A negative result does not preclude SARS-Cov-2 infection and should not be used as the sole basis for treatment or other patient management decisions. A negative result may occur with  improper specimen collection/handling, submission of specimen other than nasopharyngeal swab, presence of viral mutation(s) within the areas targeted by this assay, and inadequate number of viral copies(<138 copies/mL). A negative result must be combined with clinical observations, patient  history, and epidemiological information. The expected result is Negative.  Fact Sheet for Patients:  EntrepreneurPulse.com.au  Fact Sheet for Healthcare Providers:  IncredibleEmployment.be  This test is no t yet approved or cleared by the [place] FDA and  has been authorized for detection and/or diagnosis of SARS-CoV-2 by FDA under an Emergency Use Authorization (EUA). This EUA will remain  in effect (meaning this test can be used) for the duration of the COVID-19 declaration under Section 564(b)(1) of the Act, 21 U.S.C.section 360bbb-3(b)(1), unless the authorization is terminated  or revoked sooner.       Influenza A by PCR NEGATIVE NEGATIVE Final   Influenza B by PCR NEGATIVE NEGATIVE Final    Comment: (NOTE) The Xpert Xpress SARS-CoV-2/FLU/RSV plus assay is intended as an aid in the diagnosis of influenza from Nasopharyngeal swab specimens and should not be used as a sole basis for treatment. Nasal washings and aspirates are unacceptable for Xpert Xpress SARS-CoV-2/FLU/RSV testing.  Fact Sheet for Patients: EntrepreneurPulse.com.au  Fact Sheet for Healthcare Providers: IncredibleEmployment.be  This test is not yet approved or cleared by the [place] FDA and has been authorized for detection and/or diagnosis of SARS-CoV-2 by FDA under an Emergency Use Authorization (EUA). This EUA will remain in effect (meaning this test can be used) for the duration of the COVID-19 declaration under Section 564(b)(1) of the Act, 21 U.S.C. section 360bbb-3(b)(1), unless the authorization is terminated or revoked.  Performed at [facility], [address] [name], [place] [address]   Culture, blood (routine x 2)     Status: None   Collection Time: 09/26/20 11:07 PM   Specimen: BLOOD  Result Value Ref Range Status   Specimen Description BLOOD RIGHT ANTECUBITAL  Final  Special Requests    Final    BOTTLES DRAWN AEROBIC AND ANAEROBIC Blood Culture results may not be optimal due to an inadequate volume of blood received in culture bottles   Culture   Final    NO GROWTH 5 DAYS Performed at [facility], [address] [place], [place] [address]    Report Status 10/01/2020 FINAL  Final  Culture, blood (routine x 2)     Status: None   Collection Time: 09/26/20 11:07 PM   Specimen: BLOOD  Result Value Ref Range Status   Specimen Description BLOOD BLOOD LEFT FOREARM  Final   Special Requests   Final    BOTTLES DRAWN AEROBIC AND ANAEROBIC Blood Culture adequate volume   Culture   Final    NO GROWTH 5 DAYS Performed at [facility], [address] [place], [place] [address]    Report Status 10/01/2020 FINAL  Final  Aerobic/Anaerobic Culture w Gram Stain (surgical/deep wound)     Status: None (Preliminary result)   Collection Time: 09/30/20  1:26 PM   Specimen: PATH Other; Tissue  Result Value Ref Range Status   Specimen Description   Final    FOOT hematoma left foot Performed at [facility], [address], [place], [place] [address]    Special Requests   Final    NONE Performed at [facility], [address] [place], [place] [address]    Gram Stain NO WBC SEEN NO ORGANISMS SEEN   Final   Culture   Final    NO GROWTH 3 DAYS NO ANAEROBES ISOLATED; CULTURE IN PROGRESS FOR 5 DAYS Performed at [facility], [place] [address] [place], [place] [address]    Report Status PENDING  Incomplete    RADIOLOGY:  PERIPHERAL VASCULAR CATHETERIZATION  Result Date: 10/04/2020 See op note    CODE STATUS:     Code Status Orders  (From admission, onward)         Start     Ordered   09/26/20 2017  Full code  Continuous        09/26/20 2021        Code Status History    Date Active Date Inactive Code Status Order ID Comments User Context   07/12/2020 1256 07/19/2020 1642 Full Code [id number]  Nelva Bush, MD Inpatient    12/10/2019 1358 12/14/2019 1523 Full Code [id number]  Hollice Espy, MD Inpatient   11/12/2019 1546 11/18/2019 2125 Full Code [id number]  Collier Bullock, MD ED   10/25/2015 1118 10/26/2015 1537 Full Code [id number]  Hollice Espy, MD Inpatient   07/15/2015 2354 08/01/2015 1816 Full Code [id number]  Rust-Chester, Domingo Pulse, RN Inpatient   07/08/2015 2228 07/15/2015 2354 Full Code [id number]  Demetrios Loll, MD Inpatient   Advance Care Planning Activity       TOTAL TIME TAKING CARE OF THIS PATIENT: *40* minutes.    Fritzi Mandes M.D  [facility]    CC: Primary care physician; Jim Schwalbe, MD

## 2020-10-04 NOTE — Op Note (Signed)
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  Abdominal aortogram  2.  Left lower extremity distal runoff third order catheter placement  3.  Star close right common femoral   Anesthesia: Conscious sedation was administered by the interventional radiology RN under my direct supervision. IV Versed plus fentanyl were utilized. Continuous ECG, pulse oximetry and blood pressure was monitored throughout the entire procedure.  Conscious sedation was administered for a total of 27 minutes and 8 seconds.  Sheath: 11 cm 5 French Pinnacle sheath right common femoral retrograde  Contrast: 40 cc   Fluoroscopy Time: 2.9 minutes  Indications:  The patient presents to [facility] with ulceration of the left foot.  Pedal pulses are nonpalpable bilaterally suggesting atherosclerotic occlusive disease.  The risks and benefits as well as alternative therapies for lower extremity revascularization are reviewed with the patient all questions are answered the patient agrees to proceed.  The patient is therefore undergoing angiography with the hope for intervention for limb salvage.   Procedure:  [name] a 70 y.o. male who was identified and appropriate procedural time out was performed.  The patient was then placed supine on the table and prepped and draped in the usual sterile fashion.  Ultrasound was used to evaluate the right common  femoral artery.  It was echolucent and pulsatile indicating it is patent .  An ultrasound image was acquired for the permanent record.  A micropuncture needle was used to access the right common femoral artery under direct ultrasound guidance.  The microwire was then advanced under fluoroscopic guidance without difficulty followed by the micro-sheath.  A 0.035 J wire was advanced without resistance and a 5Fr sheath was placed.    Pigtail catheter was then advanced to the level of T12 and AP projection of the aorta was obtained. Pigtail catheter was then repositioned to above the bifurcation and RAO view of the pelvis was obtained. Stiff angled Glidewire and pigtail catheter was then used across the bifurcation and the catheter was positioned in the distal external iliac artery.  LAO of the left groin was then obtained. Wire was reintroduced and negotiated into the SFA and the catheter was advanced into the SFA. Distal runoff was then performed.  After review of the images the catheter was removed over wire and an RAO view of the groin was obtained. StarClose device was deployed without difficulty.   Findings:   Aortogram: Abdominal aorta is opacified with a bolus injection contrast.  There are mild atherosclerotic changes but there are no hemodynamically significant stenoses.  The right common iliac artery is mildly dilated.  Bilateral common iliac arteries are free of hemodynamically significant stenosis.  The external iliac arteries are free of hemodynamically significant stenosis.  Bilateral internal iliac arteries are free of hemodynamically significant stenosis.  Right Lower Extremity: The right common femoral and visualized portions of the SFA and profunda femoris demonstrate mild atherosclerotic changes but there are no hemodynamically significant stenoses.  Left Lower Extremity: The left common femoral profundofemoral and superficial femoral-popliteal arteries are free of hemodynamically  significant stenosis.  There are mild atherosclerotic changes noted.  Trifurcation is patent.  Anterior tibial is widely patent down to the foot filling the dorsalis pedis and the pedal arch.  The posterior tibial is widely patent its proximal two thirds.  Distally it becomes somewhat small but it is patent.  The peroneal is widely patent from its origin down to its terminus with a large collateral that connects directly to the distal posterior tibial which then fills the plantar vessels and the pedal arch.  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   Disposition: Patient was taken to the recovery room in stable condition having tolerated the procedure well.  Belenda Cruise Doris Gruhn 10/04/2020,9:41 AM

## 2020-10-04 NOTE — Plan of Care (Signed)
  Problem: Education: Goal: Knowledge of General Education information will improve Description: Including pain rating scale, medication(s)/side effects and non-pharmacologic comfort measures 10/04/2020 0751 by Vivien Rota, RN Outcome: Progressing 10/03/2020 1845 by Vivien Rota, RN Outcome: Progressing   Problem: Health Behavior/Discharge Planning: Goal: Ability to manage health-related needs will improve 10/04/2020 0751 by Vivien Rota, RN Outcome: Progressing 10/03/2020 1845 by Vivien Rota, RN Outcome: Progressing   Problem: Clinical Measurements: Goal: Ability to maintain clinical measurements within normal limits will improve 10/04/2020 0751 by Vivien Rota, RN Outcome: Progressing 10/03/2020 1845 by Vivien Rota, RN Outcome: Progressing Goal: Will remain free from infection 10/04/2020 0751 by Vivien Rota, RN Outcome: Progressing 10/03/2020 1845 by Vivien Rota, RN Outcome: Progressing Goal: Diagnostic test results will improve 10/04/2020 0751 by Vivien Rota, RN Outcome: Progressing 10/03/2020 1845 by Vivien Rota, RN Outcome: Progressing Goal: Respiratory complications will improve 10/04/2020 0751 by Vivien Rota, RN Outcome: Progressing 10/03/2020 1845 by Vivien Rota, RN Outcome: Progressing Goal: Cardiovascular complication will be avoided 10/04/2020 0751 by Vivien Rota, RN Outcome: Progressing 10/03/2020 1845 by Vivien Rota, RN Outcome: Progressing   Problem: Activity: Goal: Risk for activity intolerance will decrease 10/04/2020 0751 by Vivien Rota, RN Outcome: Progressing 10/03/2020 1845 by Vivien Rota, RN Outcome: Progressing   Problem: Nutrition: Goal: Adequate nutrition will be maintained 10/04/2020 0751 by Vivien Rota, RN Outcome: Progressing 10/03/2020 1845 by Vivien Rota, RN Outcome: Progressing   Problem: Coping: Goal: Level  of anxiety will decrease 10/04/2020 0751 by Vivien Rota, RN Outcome: Progressing 10/03/2020 1845 by Vivien Rota, RN Outcome: Progressing   Problem: Elimination: Goal: Will not experience complications related to bowel motility 10/04/2020 0751 by Vivien Rota, RN Outcome: Progressing 10/03/2020 1845 by Vivien Rota, RN Outcome: Progressing Goal: Will not experience complications related to urinary retention 10/04/2020 0751 by Vivien Rota, RN Outcome: Progressing 10/03/2020 1845 by Vivien Rota, RN Outcome: Progressing   Problem: Pain Managment: Goal: General experience of comfort will improve 10/04/2020 0751 by Vivien Rota, RN Outcome: Progressing 10/03/2020 1845 by Vivien Rota, RN Outcome: Progressing   Problem: Safety: Goal: Ability to remain free from injury will improve 10/04/2020 0751 by Vivien Rota, RN Outcome: Progressing 10/03/2020 1845 by Vivien Rota, RN Outcome: Progressing   Problem: Skin Integrity: Goal: Risk for impaired skin integrity will decrease 10/04/2020 0751 by Vivien Rota, RN Outcome: Progressing 10/03/2020 1845 by Vivien Rota, RN Outcome: Progressing   Problem: Education: Goal: Ability to demonstrate management of disease process will improve 10/04/2020 0751 by Vivien Rota, RN Outcome: Progressing 10/03/2020 1845 by Vivien Rota, RN Outcome: Progressing Goal: Ability to verbalize understanding of medication therapies will improve 10/04/2020 0751 by Vivien Rota, RN Outcome: Progressing 10/03/2020 1845 by Vivien Rota, RN Outcome: Progressing Goal: Individualized Educational Video(s) 10/04/2020 0751 by Vivien Rota, RN Outcome: Progressing 10/03/2020 1845 by Vivien Rota, RN Outcome: Progressing   Problem: Activity: Goal: Capacity to carry out activities will improve 10/04/2020 0751 by Vivien Rota, RN Outcome:  Progressing 10/03/2020 1845 by Vivien Rota, RN Outcome: Progressing   Problem: Cardiac: Goal: Ability to achieve and maintain adequate cardiopulmonary perfusion will improve 10/04/2020 0751 by Vivien Rota, RN Outcome: Progressing 10/03/2020 1845 by Vivien Rota, RN Outcome: Progressing

## 2020-10-04 NOTE — Interval H&P Note (Signed)
History and Physical Interval Note:  10/04/2020 7:56 AM  [name]  has presented today for surgery, with the diagnosis of Atherosclerotic disease with ulceration.  The various methods of treatment have been discussed with the patient and family. After consideration of risks, benefits and other options for treatment, the patient has consented to  Procedure(s): Lower Extremity Angiography (Left) as a surgical intervention.  The patient's history has been reviewed, patient examined, no change in status, stable for surgery.  I have reviewed the patient's chart and labs.  Questions were answered to the patient's satisfaction.     [name]

## 2020-10-04 NOTE — Plan of Care (Signed)
Problem: Education: Goal: Knowledge of General Education information will improve Description: Including pain rating scale, medication(s)/side effects and non-pharmacologic comfort measures 10/04/2020 1259 by Vivien Rota, RN Outcome: Adequate for Discharge 10/04/2020 0751 by Vivien Rota, RN Outcome: Progressing   Problem: Health Behavior/Discharge Planning: Goal: Ability to manage health-related needs will improve 10/04/2020 1259 by Toshua Honsinger, Winifred Olive, RN Outcome: Adequate for Discharge 10/04/2020 0751 by Vivien Rota, RN Outcome: Progressing   Problem: Clinical Measurements: Goal: Ability to maintain clinical measurements within normal limits will improve 10/04/2020 1259 by Vivien Rota, RN Outcome: Adequate for Discharge 10/04/2020 0751 by Vivien Rota, RN Outcome: Progressing Goal: Will remain free from infection 10/04/2020 1259 by Vivien Rota, RN Outcome: Adequate for Discharge 10/04/2020 0751 by Vivien Rota, RN Outcome: Progressing Goal: Diagnostic test results will improve 10/04/2020 1259 by Vivien Rota, RN Outcome: Adequate for Discharge 10/04/2020 0751 by Vivien Rota, RN Outcome: Progressing Goal: Respiratory complications will improve 10/04/2020 1259 by Vivien Rota, RN Outcome: Adequate for Discharge 10/04/2020 0751 by Vivien Rota, RN Outcome: Progressing Goal: Cardiovascular complication will be avoided 10/04/2020 1259 by Vivien Rota, RN Outcome: Adequate for Discharge 10/04/2020 0751 by Vivien Rota, RN Outcome: Progressing   Problem: Activity: Goal: Risk for activity intolerance will decrease 10/04/2020 1259 by Vivien Rota, RN Outcome: Adequate for Discharge 10/04/2020 0751 by Vivien Rota, RN Outcome: Progressing   Problem: Nutrition: Goal: Adequate nutrition will be maintained 10/04/2020 1259 by Vivien Rota, RN Outcome: Adequate for  Discharge 10/04/2020 0751 by Vivien Rota, RN Outcome: Progressing   Problem: Coping: Goal: Level of anxiety will decrease 10/04/2020 1259 by Vivien Rota, RN Outcome: Adequate for Discharge 10/04/2020 0751 by Vivien Rota, RN Outcome: Progressing   Problem: Elimination: Goal: Will not experience complications related to bowel motility 10/04/2020 1259 by Vivien Rota, RN Outcome: Adequate for Discharge 10/04/2020 0751 by Vivien Rota, RN Outcome: Progressing Goal: Will not experience complications related to urinary retention 10/04/2020 1259 by Vivien Rota, RN Outcome: Adequate for Discharge 10/04/2020 0751 by Vivien Rota, RN Outcome: Progressing   Problem: Pain Managment: Goal: General experience of comfort will improve 10/04/2020 1259 by Vivien Rota, RN Outcome: Adequate for Discharge 10/04/2020 0751 by Vivien Rota, RN Outcome: Progressing   Problem: Safety: Goal: Ability to remain free from injury will improve 10/04/2020 1259 by Vivien Rota, RN Outcome: Adequate for Discharge 10/04/2020 0751 by Vivien Rota, RN Outcome: Progressing   Problem: Skin Integrity: Goal: Risk for impaired skin integrity will decrease 10/04/2020 1259 by Vivien Rota, RN Outcome: Adequate for Discharge 10/04/2020 0751 by Vivien Rota, RN Outcome: Progressing   Problem: Education: Goal: Ability to demonstrate management of disease process will improve 10/04/2020 1259 by Vivien Rota, RN Outcome: Adequate for Discharge 10/04/2020 0751 by Vivien Rota, RN Outcome: Progressing Goal: Ability to verbalize understanding of medication therapies will improve 10/04/2020 1259 by Vivien Rota, RN Outcome: Adequate for Discharge 10/04/2020 0751 by Vivien Rota, RN Outcome: Progressing Goal: Individualized Educational Video(s) 10/04/2020 1259 by Vivien Rota, RN Outcome: Adequate for  Discharge 10/04/2020 0751 by Vivien Rota, RN Outcome: Progressing   Problem: Activity: Goal: Capacity to carry out activities will improve 10/04/2020 1259 by Jacqeline Broers, Winifred Olive, RN Outcome: Adequate for Discharge 10/04/2020 0751 by Vivien Rota, RN Outcome: Progressing   Problem: Cardiac: Goal: Ability to achieve and maintain adequate cardiopulmonary perfusion  will improve 10/04/2020 1259 by Coltyn Hanning, Winifred Olive, RN Outcome: Adequate for Discharge 10/04/2020 0751 by Vivien Rota, RN Outcome: Progressing

## 2020-10-04 NOTE — Progress Notes (Signed)
[facility] NAME: [name]    MR#:  [id number]  DATE OF BIRTH:  February 23, 1951  SUBJECTIVE:   Patient denies any new complaints.Patient is undergoing angiogram of his left LE today by Dr schnier  REVIEW OF SYSTEMS:   Review of Systems  Constitutional: Negative for chills, fever and weight loss.  HENT: Negative for ear discharge, ear pain and nosebleeds.   Eyes: Negative for blurred vision, pain and discharge.  Respiratory: Negative for sputum production, shortness of breath, wheezing and stridor.   Cardiovascular: Negative for chest pain, palpitations, orthopnea and PND.  Gastrointestinal: Negative for abdominal pain, diarrhea, nausea and vomiting.  Genitourinary: Negative for frequency and urgency.  Musculoskeletal: Negative for back pain and joint pain.  Neurological: Negative for sensory change, speech change, focal weakness and weakness.  Psychiatric/Behavioral: Negative for depression and hallucinations. The patient is not nervous/anxious.    Tolerating Diet:yes Tolerating PT: HHPT  DRUG ALLERGIES:   Allergies  Allergen Reactions  . Other Hives and Other (See Comments)    Blue cheese Blue cheese Blue cheese    VITALS:  Blood pressure 122/72, pulse 67, temperature 97.6 F (36.4 C), temperature source Oral, resp. rate 18, height 6' (1.829 m), weight 113.4 kg, SpO2 100 %.  PHYSICAL EXAMINATION:   Physical Exam  GENERAL:  70 y.o.-year-old patient lying in the bed with no acute distress.  LUNGS: Normal breath sounds bilaterally, no wheezing, rales, rhonchi. No use of accessory muscles of respiration.  CARDIOVASCULAR: S1, S2 normal. No murmurs, rubs, or gallops.  ABDOMEN: Soft, nontender, nondistended. Bowel sounds present. No organomegaly or mass.  EXTREMITIES:      NEUROLOGIC: Cranial nerves II through XII are intact. No focal Motor or sensory deficits b/l.   PSYCHIATRIC:  patient is alert and oriented x 3.   SKIN:as above  LABORATORY PANEL:  CBC Recent Labs  Lab 10/04/20 0442  WBC 5.7  HGB 10.8*  HCT 32.6*  PLT 166    Chemistries  Recent Labs  Lab 10/01/20 0429 10/02/20 0506 10/04/20 0442  NA 137   < > 137  K 4.0   < > 3.1*  CL 100   < > 93*  CO2 27   < > 31  GLUCOSE 90   < > 229*  BUN 62*   < > 87*  CREATININE 1.99*   < > 2.40*  CALCIUM 8.9   < > 9.0  MG 2.3  --   --    < > = values in this interval not displayed.   Cardiac Enzymes No results for input(s): TROPONINI in the last 168 hours. RADIOLOGY:  No results found. ASSESSMENT AND PLAN:  [name] a 70 y.o.Caucasian malewith medical history significant forcoronary artery disease, status post PCI and stents, type 2 diabetes mellitus, stage IIIb chronic kidney disease, hypertension, ischemic cardiomyopathy, paroxysmal atrial fibrillation on Eliquis and sleep apnea, who presented to the emergency room with acute onset of worsening leftfootswelling with associated pain and discoloration, warmth and tenderness after walking fromawhile shopping on Saturday.   Left foot hematoma with inflammation and possible  infection  -Blood culture no growth, foot x-ray/mri  did not show osteomyelitis -did have lactic acidosis on presentation, lactic acid 2.7>> 1.4 -podiatry consulted,s/p I/d on 4/29, culture no growth, per podiatry op note, wound does not appear infected -was on iV Vanco and cefepime, then linezolid and cefepime, finished total of 7 days treatment -vascular surgery consulted as well,  plan for angiogram today -podiatry/vascular surgery input appreciated   Insulin-dependent type 2 diabetes, uncontrolled, A1c 7.4, with hyperglycemia and hypoglycemia -he does not want to discontinue glipizide, he agreed to discontinue lantus and continue the rest of diabetic meds--for now cont jardiance and glipzide  Paroxysmal A. Fib Currently in sinus rhythm, continue Coreg, amiodarone, Eliquis (need to hold for  vascular procedure on tuesday)  Chronic systolic heart failure Euvolemic -Continue home medication Coreg, Jardiance, losartan -continue home diuretic torsemide  --holding metolazone due to creat  Hypokalemia  K being replaced   CKD 3B Renal function appears between 1.9--2.4 --good UOP  Constipation Senokot MiraLAX, dulcolax    The patient's BMI is: Body mass index is 33.91 kg/m.[name]   Procedures: I and D left foot--evacuation of hematoma Family communication :none today Consults :POdiatry, vascular CODE STATUS: Full DVT Prophylaxis :eliquis (on hold) Level of care: Med-Surg Status is: Inpatient  Remains inpatient appropriate because:Inpatient level of care appropriate due to severity of illness   Dispo: The patient is from: Home              Anticipated d/c is to: Home              Patient currently is medically stable to d/c.   Difficult to place patient No  Discharge plan pending Angiogram today      TOTAL TIME TAKING CARE OF THIS PATIENT: 25 minutes.  >50% time spent on counselling and coordination of care  Note: This dictation was prepared with Dragon dictation along with smaller phrase technology. Any transcriptional errors that result from this process are unintentional.  Fritzi Mandes M.D    [facility]   CC: Primary care physician; Vidal Schwalbe, MDPatient ID: [name], male   DOB: [birth year], 70 y.o.   MRN: [id number]

## 2020-10-04 NOTE — TOC Transition Note (Signed)
Transition of Care [facility]) - CM/SW Discharge Note   Patient Details  Name: [name] MRN: [id number] Date of Birth: 06/22/1950  [facility] CM/SW Contact:  Candie Chroman, LCSW Phone Number: 10/04/2020, 1:01 PM   Clinical Narrative: Patient has orders to discharge home today. No further concerns. CSW signing off.    Final next level of care: Home/Self Care Barriers to Discharge: Barriers Resolved   Patient Goals and CMS Choice        Discharge Placement                    Patient and family notified of of transfer: 10/04/20  Discharge Plan and Services                                     Social Determinants of Health (SDOH) Interventions     Readmission Risk Interventions Readmission Risk Prevention Plan 09/28/2020 12/11/2019  Transportation Screening Complete Complete  PCP or Specialist Appt within 3-5 Days Complete Complete  Social Work Consult for [place] Planning/Counseling Complete Complete  Palliative Care Screening Not Applicable Not Applicable  Medication Review Press photographer) Complete Complete  Some recent data might be hidden

## 2020-10-04 NOTE — Progress Notes (Signed)
IV site removed - reviewed discharge instructions and verbalized understanding.

## 2020-10-04 NOTE — Progress Notes (Signed)
PT Cancellation Note  Patient Details Name: [name] MRN: [id number] DOB: 04-07-1951   Cancelled Treatment:     PT attempt. Pt having angiogram. Will continue to follow and progress per acute PT protocols.    Willette Pa 10/04/2020, 8:22 AM

## 2020-10-04 NOTE — Plan of Care (Signed)
Axox4. Calm and cooperative.. NPO since MN for angiograph. Safety measures in place. Will continue to monitor.  Problem: Health Behavior/Discharge Planning: Goal: Ability to manage health-related needs will improve Outcome: Progressing   Problem: Clinical Measurements: Goal: Ability to maintain clinical measurements within normal limits will improve Outcome: Progressing Goal: Will remain free from infection Outcome: Progressing Goal: Diagnostic test results will improve Outcome: Progressing Goal: Respiratory complications will improve Outcome: Progressing Goal: Cardiovascular complication will be avoided Outcome: Progressing   Problem: Activity: Goal: Risk for activity intolerance will decrease Outcome: Progressing   Problem: Nutrition: Goal: Adequate nutrition will be maintained Outcome: Progressing   Problem: Coping: Goal: Level of anxiety will decrease Outcome: Progressing   Problem: Skin Integrity: Goal: Risk for impaired skin integrity will decrease Outcome: Progressing   Problem: Education: Goal: Ability to demonstrate management of disease process will improve Outcome: Progressing Goal: Ability to verbalize understanding of medication therapies will improve Outcome: Progressing Goal: Individualized Educational Video(s) Outcome: Progressing   Problem: Activity: Goal: Capacity to carry out activities will improve Outcome: Progressing   Problem: Cardiac: Goal: Ability to achieve and maintain adequate cardiopulmonary perfusion will improve Outcome: Progressing

## 2020-10-04 NOTE — Progress Notes (Signed)
gfr 29 scheduled for le angio today dr. Delana Meyer notified and ns ordered at 173mll/hr

## 2020-10-04 NOTE — Progress Notes (Signed)
IV site removed.  Verbalized understanding  of discharge instructions

## 2020-10-05 ENCOUNTER — Telehealth: Payer: Self-pay | Admitting: Podiatry

## 2020-10-05 ENCOUNTER — Telehealth (INDEPENDENT_AMBULATORY_CARE_PROVIDER_SITE_OTHER): Payer: Self-pay

## 2020-10-05 LAB — AEROBIC/ANAEROBIC CULTURE W GRAM STAIN (SURGICAL/DEEP WOUND)
Culture: NO GROWTH
Gram Stain: NONE SEEN

## 2020-10-05 MED ORDER — CEPHALEXIN 500 MG PO CAPS: 500.0000 mg | ORAL_CAPSULE | Freq: Three times a day (TID) | ORAL | 0 refills | Status: AC

## 2020-10-05 NOTE — Telephone Encounter (Signed)
Patient called in with a question asking how to care for the bandage in his groin for the leg angio with Dr. Delana Meyer on 10/04/20. Patient was advised that if no drainage was happening that he can remove the bandage, and if he does have some drainage a dry dressing in the groin area to cover.

## 2020-10-05 NOTE — Telephone Encounter (Signed)
Patient called stating he was discharged yesterday and didn't receive prescription for antibiotics, was told he would take an oral antibiotics. Has requested prescription be sent over to pharmacy. The following information has been updated in the chart to reflect, Please Advise

## 2020-10-05 NOTE — Addendum Note (Signed)
Addended bySherryle Lis, Klein Willcox R on: 10/05/2020 11:46 AM   Modules accepted: Orders

## 2020-10-11 ENCOUNTER — Ambulatory Visit (INDEPENDENT_AMBULATORY_CARE_PROVIDER_SITE_OTHER): Payer: Medicare Other | Admitting: Podiatry

## 2020-10-11 ENCOUNTER — Other Ambulatory Visit: Payer: Self-pay

## 2020-10-11 DIAGNOSIS — S9032XA Contusion of left foot, initial encounter: Secondary | ICD-10-CM

## 2020-10-11 DIAGNOSIS — Z9889 Other specified postprocedural states: Secondary | ICD-10-CM

## 2020-10-11 NOTE — Progress Notes (Signed)
   Subjective:  Patient presents today status post left foot incision and drainage of hematoma inpatient. DOS: 09/30/2020.  Patient states that he is doing well.  No pain.  No new complaints at this time.  Past Medical History:  Diagnosis Date  . CKD (chronic kidney disease) stage 3, GFR 30-59 ml/min (HCC)   . Coronary artery disease    a. 1998 s/p ACS Multi-link BMS to the prox LAD ([place]); b. 07/2015 Cath: LM nl, LAD patent stent, LCX min irregs, OM1/2 min irregs, OM3 nl, RCA min irregs; c. 07/2020 Cath: LM nl, LAD 50p/m, 70d, D2 70, LCX 40p/65m, OM3 40, RCA 60d->Med Rx.  . Diabetes mellitus type 2, uncontrolled ([place])    a. A1c 11.0 in 07/2015.  [name] HFrEF    a. EF 25% by cath in 2013; b. Echo in 07/2015 showing EF of 15-20%, moderate MR, moderate Pulm HTN, severely dilated IVC; c. 11/2016 Echo: EF 55-60%, no rwma, Gr1 DD, mildly dil LA, nl RV fxn. d. 12/2019 Echo: EF 40-45%, e. 07/2020 Echo: EF 20-25%, sev red RV fxn, mod dil LA, mild-mod MR.  [name] History of kidney stones   . Hypertension   . Ischemic cardiomyopathy   . Kidney stones    Left  . Paroxysmal atrial fibrillation ([place])    a. Dx 07/2015; b. CHA2DS2VASc = 5-->eliquis. Rhythm control w/ amiodarone.  . Pollen allergies 09-21-15   pt called and stated that he woke up and had some drainage-pt states he did not see the color of the drainage-pt denies running a fever and this only happened once-pt instructed to call Dr Audree Bane office if he starts running a fever or if the color of drainage becomes yellow/green  . Psoriasis   . Pulmonary hypertension ([place])   . Renal disorder    kidney stone  . Sleep apnea       Objective/Physical Exam Neurovascular status intact.  Skin incisions appear to be coapted with sutures intact. No sign of infectious process noted. No dehiscence. No active bleeding noted. Moderate edema noted to the surgical extremity.  There is some serous drainage noted to the incision sites however the sutures appear  to be intact and the skin incisions are somewhat coapted..  No purulence.   Assessment: 1. s/p incision and drainage of hematoma left foot. DOS: 09/30/2020   Plan of Care:  1. Patient was evaluated.  2.  Dressings changed today 3.  Recommend Betadine soaked gauze overlying the incision sites with dry sterile dressing 4.  Continue postsurgical shoe.  A new postsurgical shoe was dispensed in the office today 5.  Return to clinic in 1 week   Edrick Kins, DPM [facility]  Dr. Edrick Kins, DPM    [address], [place] [address]                Office [phone]  Fax [phone]

## 2020-10-12 ENCOUNTER — Telehealth: Payer: Self-pay

## 2020-10-12 ENCOUNTER — Ambulatory Visit: Payer: Medicare Other | Admitting: Podiatry

## 2020-10-12 DIAGNOSIS — I5023 Acute on chronic systolic (congestive) heart failure: Secondary | ICD-10-CM

## 2020-10-12 NOTE — Telephone Encounter (Signed)
Called patient and gave him the following recommendation from Dr. Saunders Revel:  Let's have [name] get a BMP when he is at [facility] on 5/24 follow-up of his acute kidney injury superimposed on chronic kidney disease and HFrEF. Thanks.

## 2020-10-19 ENCOUNTER — Ambulatory Visit (INDEPENDENT_AMBULATORY_CARE_PROVIDER_SITE_OTHER): Payer: Medicare Other | Admitting: Podiatry

## 2020-10-19 ENCOUNTER — Other Ambulatory Visit: Payer: Self-pay

## 2020-10-19 DIAGNOSIS — Z9889 Other specified postprocedural states: Secondary | ICD-10-CM

## 2020-10-19 DIAGNOSIS — S9032XA Contusion of left foot, initial encounter: Secondary | ICD-10-CM

## 2020-10-19 DIAGNOSIS — L97522 Non-pressure chronic ulcer of other part of left foot with fat layer exposed: Secondary | ICD-10-CM | POA: Diagnosis not present

## 2020-10-19 NOTE — Patient Instructions (Signed)
Add collagen to the wound every 2-3 days

## 2020-10-21 ENCOUNTER — Other Ambulatory Visit: Payer: Self-pay

## 2020-10-21 ENCOUNTER — Other Ambulatory Visit (INDEPENDENT_AMBULATORY_CARE_PROVIDER_SITE_OTHER): Payer: Medicare Other

## 2020-10-21 DIAGNOSIS — I5023 Acute on chronic systolic (congestive) heart failure: Secondary | ICD-10-CM

## 2020-10-22 LAB — BASIC METABOLIC PANEL
BUN/Creatinine Ratio: 30 — ABNORMAL HIGH (ref 10–24)
BUN: 66 mg/dL — ABNORMAL HIGH (ref 8–27)
CO2: 26 mmol/L (ref 20–29)
Calcium: 8.8 mg/dL (ref 8.6–10.2)
Chloride: 91 mmol/L — ABNORMAL LOW (ref 96–106)
Creatinine, Ser: 2.17 mg/dL — ABNORMAL HIGH (ref 0.76–1.27)
Glucose: 253 mg/dL — ABNORMAL HIGH (ref 65–99)
Potassium: 4.3 mmol/L (ref 3.5–5.2)
Sodium: 137 mmol/L (ref 134–144)
eGFR: 32 mL/min/{1.73_m2} — ABNORMAL LOW (ref >59)

## 2020-10-23 ENCOUNTER — Encounter: Payer: Self-pay | Admitting: Podiatry

## 2020-10-23 NOTE — Progress Notes (Signed)
  Subjective:  Patient ID: [name], male    DOB: [birth year],  MRN: [id number]  Chief Complaint  Patient presents with  . Routine Post Op     WOUND, DOS   09/30/20 left foot    DOS: 09/30/2020 Procedure: I&D of left foot hematoma  70 y.o. male returns for post-op check.  Overall doing well  Review of Systems: Negative except as noted in the HPI. Denies N/V/F/Ch.   Objective:  There were no vitals filed for this visit. There is no height or weight on file to calculate BMI. Constitutional Well developed. Well nourished.  Vascular Foot warm and well perfused. Capillary refill normal to all digits.   Neurologic Normal speech. Oriented to person, place, and time. Epicritic sensation to light touch grossly present bilaterally.  Dermatologic  medial sutures intact and well coapted.  Small superficial dehiscence of the lateral incision  Orthopedic: Tenderness to palpation noted about the surgical site.    Assessment:   1. Hematoma of left foot   2. Status post left foot surgery   3. Ulcer of left foot with fat layer exposed ([place])    Plan:  Patient was evaluated and treated and all questions answered.  S/p foot surgery left -Progressing as expected post-operatively. -WB Status: WBAT -Sutures: Plan to remove next visit -Foot redressed.  Prisma applied to lateral incision -May need continued wound care for lateral portion  Return in about 2 weeks (around 11/02/2020) for wound care.

## 2020-10-27 ENCOUNTER — Ambulatory Visit (INDEPENDENT_AMBULATORY_CARE_PROVIDER_SITE_OTHER): Payer: Medicare Other | Admitting: Internal Medicine

## 2020-10-27 ENCOUNTER — Other Ambulatory Visit: Payer: Self-pay

## 2020-10-27 ENCOUNTER — Encounter: Payer: Self-pay | Admitting: Internal Medicine

## 2020-10-27 VITALS — BP 100/50 | HR 80 | Ht 72.0 in | Wt 259.0 lb

## 2020-10-27 DIAGNOSIS — I480 Paroxysmal atrial fibrillation: Secondary | ICD-10-CM

## 2020-10-27 DIAGNOSIS — E785 Hyperlipidemia, unspecified: Secondary | ICD-10-CM

## 2020-10-27 DIAGNOSIS — E1169 Type 2 diabetes mellitus with other specified complication: Secondary | ICD-10-CM | POA: Diagnosis not present

## 2020-10-27 DIAGNOSIS — I2510 Atherosclerotic heart disease of native coronary artery without angina pectoris: Secondary | ICD-10-CM

## 2020-10-27 DIAGNOSIS — I5022 Chronic systolic (congestive) heart failure: Secondary | ICD-10-CM

## 2020-10-27 NOTE — Progress Notes (Signed)
Follow-up Outpatient Visit Date: 10/27/2020  Primary Care Provider: Vidal Schwalbe, MD [address] [place] [address]  Chief Complaint: Follow-up heart failure  HPI:  Mr. Rittenhouse is a 70 y.o. male with history of chronic HFrEF, CAD status post PCI to the proximal LAD, paroxysmal atrial fibrillation, chronic kidney disease, hypertension, and type 2 diabetes mellitus, who presents for follow-up of chronic HFrEF.  I last saw him in late March, at which time he reported feeling a little bit more short of breath with increasing weight.  We agreed to add metolazone to his standing regimen of torsemide, which resulted in vigorous diuresis but acute kidney injury superimposed on his chronic kidney disease.  Torsemide was held and the patient subsequently admitted due to lower extremity cellulitis ultimately requiring surgical debridement due to hematoma of the left foot.  Lower extremity arteriogram did not show significant occlusive disease.  It was felt that venous insufficiency and stasis dermatitis had contributed to his infection.  Today, Mr. Main reports that he is feeling very well.  His weights have been fairly stable between 202 155 pounds.  He has minimal exertional dyspnea and no chest pain.  Leg swelling has also been well controlled.  He notes that his left foot wounds are healing, with continued follow-up through podiatry.  He currently is taking torsemide 40 mg daily and metolazone 2.5 mg on Mondays, Wednesdays, and Fridays.  He reports only rare dizziness.  --------------------------------------------------------------------------------------------------  Past Medical History:  Diagnosis Date  . CKD (chronic kidney disease) stage 3, GFR 30-59 ml/min (HCC)   . Coronary artery disease    a. 1998 s/p ACS Multi-link BMS to the prox LAD ([place], [place]); b. 07/2015 Cath: LM nl, LAD patent stent, LCX min irregs, OM1/2 min irregs, OM3 nl, RCA min irregs; c. 07/2020 Cath: LM nl, LAD  50p/m, 70d, D2 70, LCX 40p/31m, OM3 40, RCA 60d->Med Rx.  . Diabetes mellitus type 2, uncontrolled ([place])    a. A1c 11.0 in 07/2015.  [name] HFrEF    a. EF 25% by cath in 2013; b. Echo in 07/2015 showing EF of 15-20%, moderate MR, moderate Pulm HTN, severely dilated IVC; c. 11/2016 Echo: EF 55-60%, no rwma, Gr1 DD, mildly dil LA, nl RV fxn. d. 12/2019 Echo: EF 40-45%, e. 07/2020 Echo: EF 20-25%, sev red RV fxn, mod dil LA, mild-mod MR.  [name] History of kidney stones   . Hypertension   . Ischemic cardiomyopathy   . Kidney stones    Left  . Paroxysmal atrial fibrillation ([place])    a. Dx 07/2015; b. CHA2DS2VASc = 5-->eliquis. Rhythm control w/ amiodarone.  . Pollen allergies 09-21-15   pt called and stated that he woke up and had some drainage-pt states he did not see the color of the drainage-pt denies running a fever and this only happened once-pt instructed to call Dr Audree Bane office if he starts running a fever or if the color of drainage becomes yellow/green  . Psoriasis   . Pulmonary hypertension ([place])   . Renal disorder    kidney stone  . Sleep apnea    Past Surgical History:  Procedure Laterality Date  . CARDIAC CATHETERIZATION N/A 07/25/2015   Procedure: Right/Left Heart Cath and Coronary Angiography;  Surgeon: Wellington Hampshire, MD;  Location: [facility];  Service: Cardiovascular;  Laterality: N/A;  . CARDIAC CATHETERIZATION     [place]  . [place], [place]  WITH STENT PLACEMENT    . CYSTOSCOPY WITH STENT PLACEMENT Left 09/07/2015   Procedure: CYSTOSCOPY WITH STENT PLACEMENT;  Surgeon: Hollice Espy, MD;  Location: [facility];  Service: Urology;  Laterality: Left;  . CYSTOSCOPY/URETEROSCOPY/HOLMIUM LASER/STENT PLACEMENT Left 10/25/2015   Procedure: CYSTOSCOPY/URETEROSCOPY/HOLMIUM LASER/STENT EXCHANGE;  Surgeon: Hollice Espy, MD;  Location: [facility];  Service: Urology;  Laterality: Left;  . CYSTOSCOPY/URETEROSCOPY/HOLMIUM  LASER/STENT PLACEMENT Left 05/11/2019   Procedure: CYSTOSCOPY/URETEROSCOPY/HOLMIUM LASER/STENT PLACEMENT;  Surgeon: Hollice Espy, MD;  Location: [facility];  Service: Urology;  Laterality: Left;  . EXTRACORPOREAL SHOCK WAVE LITHOTRIPSY Left 04/02/2019   Procedure: EXTRACORPOREAL SHOCK WAVE LITHOTRIPSY (ESWL);  Surgeon: Hollice Espy, MD;  Location: [facility];  Service: Urology;  Laterality: Left;  . EXTRACORPOREAL SHOCK WAVE LITHOTRIPSY Left 03/05/2019   Procedure: EXTRACORPOREAL SHOCK WAVE LITHOTRIPSY (ESWL);  Surgeon: Abbie Sons, MD;  Location: [facility];  Service: Urology;  Laterality: Left;  . EYE SURGERY Bilateral    LASER FOR GLAUCOMA  . IRRIGATION AND DEBRIDEMENT FOOT Left 09/30/2020   Procedure: IRRIGATION AND DEBRIDEMENT FOOT;  Surgeon: Criselda Peaches, DPM;  Location: [facility];  Service: Podiatry;  Laterality: Left;  . KIDNEY SURGERY    . LOWER EXTREMITY ANGIOGRAPHY Left 10/04/2020   Procedure: Lower Extremity Angiography;  Surgeon: Katha Cabal, MD;  Location: [facility];  Service: Cardiovascular;  Laterality: Left;  . NEPHROSTOMY TUBE PLACEMENT ([place] HX) Left 11/2019  . RIGHT/LEFT HEART CATH AND CORONARY ANGIOGRAPHY N/A 07/12/2020   Procedure: RIGHT/LEFT HEART CATH AND CORONARY ANGIOGRAPHY;  Surgeon: Nelva Bush, MD;  Location: [facility];  Service: Cardiovascular;  Laterality: N/A;  . ROBOT ASSISTED LAPAROSCOPIC NEPHRECTOMY Left 12/10/2019   Procedure: XI ROBOTIC ASSISTED LAPAROSCOPIC NEPHRECTOMY;  Surgeon: Hollice Espy, MD;  Location: [facility];  Service: Urology;  Laterality: Left;  . URETEROSCOPY WITH HOLMIUM LASER LITHOTRIPSY Left 09/07/2015   Procedure: URETEROSCOPY WITH HOLMIUM LASER LITHOTRIPSY;  Surgeon: Hollice Espy, MD;  Location: [facility];  Service: Urology;  Laterality: Left;    Current Meds  Medication Sig  . amiodarone (PACERONE) 200 MG tablet Take 1 tablet (200 mg total) by mouth daily.  [name] apixaban (ELIQUIS) 5 MG TABS tablet Take 1  tablet (5 mg total) 2 (two) times daily by mouth.  . Bromelains (BROMELAIN PO) Take 2 capsules by mouth daily.  . carvedilol (COREG) 6.25 MG tablet Take 1 tablet (6.25 mg total) by mouth 2 (two) times daily with a meal.  . CINNAMON PO Take 1,200 mg by mouth in the morning and at bedtime. [place] Cinnamon 1200mg   . clonazePAM (KLONOPIN) 1 MG tablet Take 1 mg by mouth See admin instructions. Take 1 tablet (1 mg) by mouth scheduled at bedtime, may take an additional dose during the day if needed for anxiety  . Coenzyme Q10 (COQ10) 400 MG CAPS Take 400 mg by mouth 2 (two) times daily.  [name] docusate sodium (COLACE) 100 MG capsule Take 300 mg in the am & 200 mg in the pm.  . Dulaglutide 1.5 MG/0.5ML SOPN Inject 1.5 mg into the skin once a week. (Monday Nights)  . glipiZIDE (GLUCOTROL) 10 MG tablet Take 10 mg by mouth 2 (two) times daily before a meal.  . HAWTHORNE BERRY PO Take 565 mg by mouth at bedtime.  . Insulin Glargine (TOUJEO SOLOSTAR [place]) Inject 28 Units into the skin as needed.  [name] JARDIANCE 10 MG TABS tablet Take 10 mg by mouth every evening.   [name] levothyroxine (SYNTHROID) 50 MCG tablet Take by mouth  daily at 12 noon.  [name] losartan (COZAAR) 25 MG tablet Take 0.5 tablets (12.5 mg total) by mouth daily.  . magnesium oxide (MAG-OX) 400 (241.3 Mg) MG tablet Take 1 tablet (400 mg total) by mouth daily.  [name] MELATONIN-THEANINE PO Take 2 capsules by mouth at bedtime. [place] Sleep Gummies  . metolazone (ZAROXOLYN) 2.5 MG tablet Take 2.5 mg by mouth every Monday, Wednesday, and Friday.  . Multiple Vitamins-Minerals (PRESERVISION AREDS 2 PO) Take 1 tablet by mouth in the morning and at bedtime.  . potassium chloride SA (KLOR-CON) 20 MEQ tablet Take 2 tablets (40 mEq total) by mouth daily.  . psyllium (REGULOID) 0.52 g capsule Take 2.6 g by mouth 2 (two) times daily. 5 capsules in the morning and 3 capsule at night time  . rosuvastatin (CRESTOR) 20 MG tablet Take 20 mg by mouth every evening.   . Torsemide 40 MG  TABS Take 40 mg by mouth daily.  . valACYclovir (VALTREX) 1000 MG tablet Take 1,000 mg by mouth daily as needed.  . vitamin C (ASCORBIC ACID) 500 MG tablet Take 500 mg by mouth at bedtime.    Allergies: Other  Social History   Tobacco Use  . Smoking status: Never Smoker  . Smokeless tobacco: Never Used  Vaping Use  . Vaping Use: Never used  Substance Use Topics  . Alcohol use: Yes    Alcohol/week: 0.0 standard drinks    Comment: occasionally  . Drug use: No    Types: Marijuana, Cocaine, Opium, LSD    Comment: Past, greater than 10 years ago    Family History  Problem Relation Age of Onset  . Heart failure Father   . Heart attack Brother   . Kidney cancer Neg Hx   . Bladder Cancer Neg Hx   . Prostate cancer Neg Hx     Review of Systems: A 12-system review of systems was performed and was negative except as noted in the HPI.  --------------------------------------------------------------------------------------------------  Physical Exam: BP (!) 100/50 (BP Location: Left Arm, Patient Position: Sitting, Cuff Size: Large)   Pulse 80   Ht 6' (1.829 m)   Wt 259 lb (117.5 kg)   SpO2 96%   BMI 35.13 kg/m   General:  NAD. Neck: No JVD or HJR, though evaluation is limited by body habitus and facial hair. Lungs: Clear to auscultation bilaterally without wheezes or crackles. Heart: Regular rate and rhythm without murmurs, rubs, or gallops. Abdomen: Soft, nontender, nondistended. Extremities: Trace pretibial edema bilaterally.  Left foot and ankle are covered with dressing.  EKG: Normal sinus rhythm with first-degree AV block (PR interval 238 ms), left axis deviation, nonspecific intraventricular conduction delay, and poor R wave progression.  No significant change from prior tracing on 09/13/2020.  Lab Results  Component Value Date   WBC 5.7 10/04/2020   HGB 10.8 (L) 10/04/2020   HCT 32.6 (L) 10/04/2020   MCV 95.9 10/04/2020   PLT 166 10/04/2020    Lab Results   Component Value Date   NA 137 10/21/2020   K 4.3 10/21/2020   CL 91 (L) 10/21/2020   CO2 26 10/21/2020   BUN 66 (H) 10/21/2020   CREATININE 2.17 (H) 10/21/2020   GLUCOSE 253 (H) 10/21/2020   ALT 20 09/26/2020    Lab Results  Component Value Date   CHOL 153 07/09/2015   HDL 34 (L) 07/09/2015   LDLCALC 85 07/09/2015   TRIG 168 (H) 07/09/2015   CHOLHDL 4.5 07/09/2015    --------------------------------------------------------------------------------------------------  ASSESSMENT AND PLAN: Chronic HFrEF: Mr. Saindon is maintaining relatively euvolemic status though his weight has been trending up slightly the last few days (most recently 256 pounds at home).  I would like to target a weight between 202 155 pounds.  We will continue with torsemide 40 mg daily and metolazone 2.5 mg on Mondays, Wednesdays, and Fridays.  However, if his weight is consistently above 255 pounds, I have asked him to take metolazone 2.5 mg daily until his weight drops below 255 pounds again.  Sodium and fluid restriction will continue.  We will also continue his current GDMT consisting of carvedilol 6.25 mg twice daily, empagliflozin 10 mg daily, and losartan 12.5 mg daily.  Borderline low blood pressure precludes escalation at this time.  We will plan to obtain a limited echo to reevaluate LVEF on the day of Mr. Arenson follow-up visit in 4 to 6 weeks.  Paroxysmal atrial fibrillation: Mr. Kerney is maintaining sinus rhythm.  We will continue amiodarone and apixaban.  Coronary artery disease: No angina reported.  Recent catheterization showed no obstructive CAD with patent LAD stent.  Continue secondary prevention including statin therapy and apixaban in lieu of aspirin given history of PAF.  Hyperlipidemia associated with type 2 diabetes mellitus: Continue rosuvastatin 20 mg daily for target LDL less than 70.  Ongoing management of diabetes mellitus per PCP and endocrinology.  Cellulitis: Improving per Mr.  Mehring report.  Continue close follow-up with podiatry.  Recent angiogram without evidence of obstructive PAD.  Morbid obesity: BMI remains at 35 with multiple comorbidities.  Weight loss encouraged through diet and exercise.  Follow-up: Return to clinic in 4 to 6 weeks with follow-up echo at that time.  Nelva Bush, MD 10/28/2020 10:12 AM

## 2020-10-27 NOTE — Patient Instructions (Signed)
Medication Instructions:  No changes at this time.   *If you need a refill on your cardiac medications before your next appointment, please call your pharmacy*   Lab Work: None  If you have labs (blood work) drawn today and your tests are completely normal, you will receive your results only by: [name] MyChart Message (if you have MyChart) OR . A paper copy in the mail If you have any lab test that is abnormal or we need to change your treatment, we will call you to review the results.   Testing/Procedures: Your physician has requested that you have an echocardiogram before next appointment.   Echocardiography is a painless test that uses sound waves to create images of your heart. It provides your doctor with information about the size and shape of your heart and how well your heart's chambers and valves are working. This procedure takes approximately one hour. There are no restrictions for this procedure.     Follow-Up: [facility] you and your health needs are our priority.  As part of our continuing mission to provide you with exceptional heart care, we have created designated Provider Care Teams.  These Care Teams include your primary Cardiologist (physician) and Advanced Practice Providers (APPs -  Physician Assistants and Nurse Practitioners) who all work together to provide you with the care you need, when you need it.   Your next appointment:   4-6  week(s)  The format for your next appointment:   In Person  Provider:   Nelva Bush, MD or Christell Faith, PA-C

## 2020-10-28 ENCOUNTER — Encounter: Payer: Self-pay | Admitting: Internal Medicine

## 2020-11-02 ENCOUNTER — Ambulatory Visit (INDEPENDENT_AMBULATORY_CARE_PROVIDER_SITE_OTHER): Payer: Medicare Other | Admitting: Podiatry

## 2020-11-02 ENCOUNTER — Other Ambulatory Visit: Payer: Self-pay

## 2020-11-02 DIAGNOSIS — L97522 Non-pressure chronic ulcer of other part of left foot with fat layer exposed: Secondary | ICD-10-CM | POA: Diagnosis not present

## 2020-11-03 ENCOUNTER — Encounter: Payer: Self-pay | Admitting: Podiatry

## 2020-11-03 ENCOUNTER — Telehealth: Payer: Self-pay

## 2020-11-03 NOTE — Telephone Encounter (Signed)
Demographics, office note and orders for wound care faxed to [facility].  Wound measures 2.0 cm x 0.3 cm x 0.5 cm full thickness 90 days supply Collagen dressing every 3 days Silicone bordered foam every 3 days Saline Gloves

## 2020-11-03 NOTE — Progress Notes (Signed)
  Subjective:  Patient ID: [name], male    DOB: 1950/06/22,  MRN: [id number]  Follow up from surgery, has been applying collagen  DOS: 09/30/2020 Procedure: I&D of left foot hematoma  70 y.o. male returns for post-op check.  Overall doing well  Review of Systems: Negative except as noted in the HPI. Denies N/V/F/Ch.   Objective:  There were no vitals filed for this visit. There is no height or weight on file to calculate BMI. Constitutional Well developed. Well nourished.  Vascular Foot warm and well perfused. Capillary refill normal to all digits.   Neurologic Normal speech. Oriented to person, place, and time. Epicritic sensation to light touch grossly present bilaterally.  Dermatologic  Open ulcer measures 2.0 x 0.3 x 0.cm  from lateral incision, granular wound bed no signs of infection  Orthopedic: Tenderness to palpation noted about the surgical site.    Assessment:   1. Ulcer of left foot with fat layer exposed ([place])    Plan:  Patient was evaluated and treated and all questions answered.  S/p foot surgery left  Ulcer left foot -Debridement as below. -Dressed with collagen silver, silicone foam border dressing  Procedure: Excisional Debridement of Wound Rationale: Removal of non-viable soft tissue from the wound to promote healing.  Anesthesia: none Post-Debridement Wound Measurements: 2.0 x 0.3 x 0.5 cm  Type of Debridement: Sharp Excisional Tissue Removed: Non-viable soft tissue Depth of Debridement: subcutaneous tissue. Technique: Sharp excisional debridement to bleeding, viable wound base.  Dressing: Dry, sterile, compression dressing. Disposition: Patient tolerated procedure well.      Return in about 3 weeks (around 11/23/2020) for wound care.

## 2020-11-04 ENCOUNTER — Other Ambulatory Visit (INDEPENDENT_AMBULATORY_CARE_PROVIDER_SITE_OTHER): Payer: Self-pay | Admitting: Vascular Surgery

## 2020-11-04 DIAGNOSIS — I872 Venous insufficiency (chronic) (peripheral): Secondary | ICD-10-CM

## 2020-11-04 DIAGNOSIS — L97529 Non-pressure chronic ulcer of other part of left foot with unspecified severity: Secondary | ICD-10-CM

## 2020-11-07 ENCOUNTER — Ambulatory Visit (INDEPENDENT_AMBULATORY_CARE_PROVIDER_SITE_OTHER): Payer: Medicare Other

## 2020-11-07 ENCOUNTER — Ambulatory Visit (INDEPENDENT_AMBULATORY_CARE_PROVIDER_SITE_OTHER): Payer: Medicare Other | Admitting: Vascular Surgery

## 2020-11-07 ENCOUNTER — Other Ambulatory Visit: Payer: Self-pay

## 2020-11-07 ENCOUNTER — Encounter (INDEPENDENT_AMBULATORY_CARE_PROVIDER_SITE_OTHER): Payer: Self-pay | Admitting: Vascular Surgery

## 2020-11-07 VITALS — BP 105/63 | HR 74 | Ht 72.0 in | Wt 257.0 lb

## 2020-11-07 DIAGNOSIS — L97529 Non-pressure chronic ulcer of other part of left foot with unspecified severity: Secondary | ICD-10-CM

## 2020-11-07 DIAGNOSIS — I2510 Atherosclerotic heart disease of native coronary artery without angina pectoris: Secondary | ICD-10-CM | POA: Diagnosis not present

## 2020-11-07 DIAGNOSIS — I10 Essential (primary) hypertension: Secondary | ICD-10-CM

## 2020-11-07 DIAGNOSIS — I480 Paroxysmal atrial fibrillation: Secondary | ICD-10-CM

## 2020-11-07 DIAGNOSIS — I739 Peripheral vascular disease, unspecified: Secondary | ICD-10-CM

## 2020-11-07 DIAGNOSIS — I872 Venous insufficiency (chronic) (peripheral): Secondary | ICD-10-CM | POA: Diagnosis not present

## 2020-11-08 ENCOUNTER — Encounter (INDEPENDENT_AMBULATORY_CARE_PROVIDER_SITE_OTHER): Payer: Self-pay | Admitting: Vascular Surgery

## 2020-11-08 DIAGNOSIS — I739 Peripheral vascular disease, unspecified: Secondary | ICD-10-CM | POA: Insufficient documentation

## 2020-11-08 DIAGNOSIS — I872 Venous insufficiency (chronic) (peripheral): Secondary | ICD-10-CM | POA: Insufficient documentation

## 2020-11-08 NOTE — Progress Notes (Signed)
MRN : [id number]  [name] is a 70 y.o. ([birth year]) male who presents with chief complaint of  Chief Complaint  Patient presents with  . Follow-up    1 Mo [facility] post LE angio U/S  .  History of Present Illness:   Patient returns the office for follow-up evaluation of his lower extremity vascular disease.  He was admitted toward the end of April 2022 with a ulceration of the left foot he was noted to have fairly severe venous changes to both lower extremities in addition to nonpalpable pedal pulses.  Angiogram was performed Oct 04, 2020: This study did not identify any atherosclerotic hemodynamically significant lesions his anterior tibial is his dominant runoff filling the dorsalis pedis and the pedal arch.  No intervention was indicated.  He returns today for follow-up regarding the venous changes of his lower extremities.  He notes his wound has been healing quite nicely he denies pain at the site.  He denies drainage.  Denies fever chills.  Duplex ultrasound of the venous system done today demonstrates patent deep venous system no deep reflux is identified.  The deep veins are compressible with phasic flow.  Superficial veins are patent without reflux.  Current Meds  Medication Sig  . amiodarone (PACERONE) 200 MG tablet Take 1 tablet (200 mg total) by mouth daily.  [name] apixaban (ELIQUIS) 5 MG TABS tablet Take 1 tablet (5 mg total) 2 (two) times daily by mouth.  . Bromelains (BROMELAIN PO) Take 2 capsules by mouth daily.  . carvedilol (COREG) 6.25 MG tablet Take 1 tablet (6.25 mg total) by mouth 2 (two) times daily with a meal.  . CINNAMON PO Take 1,200 mg by mouth in the morning and at bedtime. [place] Cinnamon 1200mg   . clonazePAM (KLONOPIN) 1 MG tablet Take 1 mg by mouth See admin instructions. Take 1 tablet (1 mg) by mouth scheduled at bedtime, may take an additional dose during the day if needed for anxiety  . Coenzyme Q10 (COQ10) 400 MG CAPS Take 400 mg by mouth 2 (two)  times daily.  [name] docusate sodium (COLACE) 100 MG capsule Take 300 mg in the am & 200 mg in the pm.  . Dulaglutide 1.5 MG/0.5ML SOPN Inject 1.5 mg into the skin once a week. (Monday Nights)  . glipiZIDE (GLUCOTROL) 10 MG tablet Take 10 mg by mouth 2 (two) times daily before a meal.  . HAWTHORNE BERRY PO Take 565 mg by mouth at bedtime.  . Insulin Glargine (TOUJEO SOLOSTAR [place]) Inject 28 Units into the skin as needed.  [name] JARDIANCE 10 MG TABS tablet Take 10 mg by mouth every evening.   [name] levothyroxine (SYNTHROID) 50 MCG tablet Take by mouth daily at 12 noon.  [name] losartan (COZAAR) 25 MG tablet Take 0.5 tablets (12.5 mg total) by mouth daily.  . magnesium oxide (MAG-OX) 400 (241.3 Mg) MG tablet Take 1 tablet (400 mg total) by mouth daily.  [name] MELATONIN-THEANINE PO Take 2 capsules by mouth at bedtime. [place] Sleep Gummies  . metolazone (ZAROXOLYN) 2.5 MG tablet Take 2.5 mg by mouth every Monday, Wednesday, and Friday.  . Multiple Vitamins-Minerals (PRESERVISION AREDS 2 PO) Take 1 tablet by mouth in the morning and at bedtime.  . potassium chloride SA (KLOR-CON) 20 MEQ tablet Take 2 tablets (40 mEq total) by mouth daily.  . psyllium (REGULOID) 0.52 g capsule Take 2.6 g by mouth 2 (two) times daily. 5 capsules in the morning and 3 capsule at night time  .  rosuvastatin (CRESTOR) 20 MG tablet Take 20 mg by mouth every evening.   . Torsemide 40 MG TABS Take 40 mg by mouth daily.  . valACYclovir (VALTREX) 1000 MG tablet Take 1,000 mg by mouth daily as needed.  . vitamin C (ASCORBIC ACID) 500 MG tablet Take 500 mg by mouth at bedtime.    Past Medical History:  Diagnosis Date  . CKD (chronic kidney disease) stage 3, GFR 30-59 ml/min (HCC)   . Coronary artery disease    a. 1998 s/p ACS Multi-link BMS to the prox LAD ([place], [place]); b. 07/2015 Cath: LM nl, LAD patent stent, LCX min irregs, OM1/2 min irregs, OM3 nl, RCA min irregs; c. 07/2020 Cath: LM nl, LAD 50p/m, 70d, D2 70, LCX 40p/22m, OM3 40, RCA  60d->Med Rx.  . Diabetes mellitus type 2, uncontrolled ([place])    a. A1c 11.0 in 07/2015.  [name] HFrEF    a. EF 25% by cath in 2013; b. Echo in 07/2015 showing EF of 15-20%, moderate MR, moderate Pulm HTN, severely dilated IVC; c. 11/2016 Echo: EF 55-60%, no rwma, Gr1 DD, mildly dil LA, nl RV fxn. d. 12/2019 Echo: EF 40-45%, e. 07/2020 Echo: EF 20-25%, sev red RV fxn, mod dil LA, mild-mod MR.  [name] History of kidney stones   . Hypertension   . Ischemic cardiomyopathy   . Kidney stones    Left  . Paroxysmal atrial fibrillation ([place])    a. Dx 07/2015; b. CHA2DS2VASc = 5-->eliquis. Rhythm control w/ amiodarone.  . Pollen allergies 09-21-15   pt called and stated that he woke up and had some drainage-pt states he did not see the color of the drainage-pt denies running a fever and this only happened once-pt instructed to call Dr Audree Bane office if he starts running a fever or if the color of drainage becomes yellow/green  . Psoriasis   . Pulmonary hypertension ([place])   . Renal disorder    kidney stone  . Sleep apnea     Past Surgical History:  Procedure Laterality Date  . CARDIAC CATHETERIZATION N/A 07/25/2015   Procedure: Right/Left Heart Cath and Coronary Angiography;  Surgeon: Wellington Hampshire, MD;  Location: [facility];  Service: Cardiovascular;  Laterality: N/A;  . CARDIAC CATHE[place]IZ[place]     [place]  . CARDIAC CATHETERIZATION     [facility], [place]    . CYSTOSCOPY WITH STENT PLACEMENT Left 09/07/2015   Procedure: CYSTOSCOPY WITH STENT PLACEMENT;  Surgeon: Hollice Espy, MD;  Location: [facility];  Service: Urology;  Laterality: Left;  . CYSTOSCOPY/URETEROSCOPY/HOLMIUM LASER/STENT PLACEMENT Left 10/25/2015   Procedure: CYSTOSCOPY/URETEROSCOPY/HOLMIUM LASER/STENT EXCHANGE;  Surgeon: Hollice Espy, MD;  Location: [facility];  Service: Urology;  Laterality: Left;  . CYSTOSCOPY/URETEROSCOPY/HOLMIUM LASER/STENT PLACEMENT Left 05/11/2019    Procedure: CYSTOSCOPY/URETEROSCOPY/HOLMIUM LASER/STENT PLACEMENT;  Surgeon: Hollice Espy, MD;  Location: [facility];  Service: Urology;  Laterality: Left;  . EXTRACORPOREAL SHOCK WAVE LITHOTRIPSY Left 04/02/2019   Procedure: EXTRACORPOREAL SHOCK WAVE LITHOTRIPSY (ESWL);  Surgeon: Hollice Espy, MD;  Location: [facility];  Service: Urology;  Laterality: Left;  . EXTRACORPOREAL SHOCK WAVE LITHOTRIPSY Left 03/05/2019   Procedure: EXTRACORPOREAL SHOCK WAVE LITHOTRIPSY (ESWL);  Surgeon: Abbie Sons, MD;  Location: [facility];  Service: Urology;  Laterality: Left;  . EYE SURGERY Bilateral    LASER FOR GLAUCOMA  . IRRIGATION AND DEBRIDEMENT FOOT Left 09/30/2020   Procedure: IRRIGATION AND DEBRIDEMENT FOOT;  Surgeon: Criselda Peaches, DPM;  Location: [facility];  Service: Podiatry;  Laterality: Left;  . KIDNEY SURGERY    . LOWER EXTREMITY ANGIOGRAPHY Left 10/04/2020   Procedure: Lower Extremity Angiography;  Surgeon: Katha Cabal, MD;  Location: [facility];  Service: Cardiovascular;  Laterality: Left;  . NEPHROSTOMY TUBE PLACEMENT ([place] [facility]) Left 11/2019  . RIGHT/LEFT HEART CATH AND CORONARY ANGIOGRAPHY N/A 07/12/2020   Procedure: RIGHT/LEFT HEART CATH AND CORONARY ANGIOGRAPHY;  Surgeon: Nelva Bush, MD;  Location: [facility];  Service: Cardiovascular;  Laterality: N/A;  . ROBOT ASSISTED LAPAROSCOPIC NEPHRECTOMY Left 12/10/2019   Procedure: XI ROBOTIC ASSISTED LAPAROSCOPIC NEPHRECTOMY;  Surgeon: Hollice Espy, MD;  Location: [facility];  Service: Urology;  Laterality: Left;  . URETEROSCOPY WITH HOLMIUM LASER LITHOTRIPSY Left 09/07/2015   Procedure: URETEROSCOPY WITH HOLMIUM LASER LITHOTRIPSY;  Surgeon: Hollice Espy, MD;  Location: [facility];  Service: Urology;  Laterality: Left;    Social History Social History   Tobacco Use  . Smoking status: Never Smoker  . Smokeless tobacco: Never Used  Vaping Use  . Vaping Use: Never used  Substance Use Topics  . Alcohol use: Yes     Alcohol/week: 0.0 standard drinks    Comment: occasionally  . Drug use: No    Types: Marijuana, Cocaine, Opium, LSD    Comment: Past, greater than 10 years ago    Family History Family History  Problem Relation Age of Onset  . Heart failure Father   . Heart attack Brother   . Kidney cancer Neg Hx   . Bladder Cancer Neg Hx   . Prostate cancer Neg Hx     Allergies  Allergen Reactions  . Other Hives and Other (See Comments)    Blue cheese Blue cheese Blue cheese     REVIEW OF SYSTEMS (Negative unless checked)  Constitutional: [] Weight loss  [] Fever  [] Chills Cardiac: [] Chest pain   [] Chest pressure   [] Palpitations   [] Shortness of breath when laying flat   [] Shortness of breath with exertion. Vascular:  [] Pain in legs with walking   [] Pain in legs at rest  [] History of DVT   [] Phlebitis   [x] Swelling in legs   [] Varicose veins   [x] Healing ulcers Pulmonary:   [] Uses home oxygen   [] Productive cough   [] Hemoptysis   [] Wheeze  [] COPD   [] Asthma Neurologic:  [] Dizziness   [] Seizures   [] History of stroke   [] History of TIA  [] Aphasia   [] Vissual changes   [] Weakness or numbness in arm   [] Weakness or numbness in leg Musculoskeletal:   [] Joint swelling   [] Joint pain   [] Low back pain Hematologic:  [] Easy bruising  [] Easy bleeding   [] Hypercoagulable state   [] Anemic Gastrointestinal:  [] Diarrhea   [] Vomiting  [] Gastroesophageal reflux/heartburn   [] Difficulty swallowing. Genitourinary:  [] Chronic kidney disease   [] Difficult urination  [] Frequent urination   [] Blood in urine Skin:  [] Rashes   [] Ulcers  Psychological:  [] History of anxiety   []  History of major depression.  Physical Examination  Vitals:   11/07/20 1414  BP: 105/63  Pulse: 74  Weight: 257 lb (116.6 kg)  Height: 6' (1.829 m)   Body mass index is 34.86 kg/m. Gen: WD/WN, NAD Head: [place]/AT, No temporalis wasting.  Ear/Nose/Throat: Hearing grossly intact, nares w/o erythema or drainage Eyes: PER, EOMI, sclera  nonicteric.  Neck: Supple, no large masses.   Pulmonary:  Good air movement, no audible wheezing bilaterally, no use of accessory muscles.  Cardiac: RRR, no JVD Vascular: scattered varicosities present bilaterally.  Severe  venous stasis changes to the legs bilaterally.  1+ non-pitting edema Vessel Right Left  Radial Palpable Palpable  PT Not Palpable Not Palpable  DP Not Palpable Not Palpable  Gastrointestinal: Non-distended. No guarding/no peritoneal signs.  Musculoskeletal: M/S 5/5 throughout.  No deformity or atrophy.  Neurologic: CN 2-12 intact. Symmetrical.  Speech is fluent. Motor exam as listed above. Psychiatric: Judgment intact, Mood & affect appropriate for pt's clinical situation. Dermatologic: Severe venous rashes with left foot ulcer noted.  No changes consistent with cellulitis. Lymph : No lichenification or skin changes of chronic lymphedema.  CBC Lab Results  Component Value Date   WBC 5.7 10/04/2020   HGB 10.8 (L) 10/04/2020   HCT 32.6 (L) 10/04/2020   MCV 95.9 10/04/2020   PLT 166 10/04/2020    BMET    Component Value Date/Time   NA 137 10/21/2020 1202   K 4.3 10/21/2020 1202   CL 91 (L) 10/21/2020 1202   CO2 26 10/21/2020 1202   GLUCOSE 253 (H) 10/21/2020 1202   GLUCOSE 229 (H) 10/04/2020 0442   BUN 66 (H) 10/21/2020 1202   CREATININE 2.17 (H) 10/21/2020 1202   CALCIUM 8.8 10/21/2020 1202   GFRNONAA 29 (L) 10/04/2020 0442   GFRAA 36 (L) 07/08/2020 1525   Estimated Creatinine Clearance: 42.9 mL/min (A) (by C-G formula based on SCr of 2.17 mg/dL (H)).  COAG Lab Results  Component Value Date   INR 1.1 12/08/2019   INR 1.5 (H) 04/10/2019   INR 1.18 09/07/2015    Radiology VAS [place] LOWER EXTREMITY VENOUS REFLUX  Result Date: 11/07/2020  Lower Venous Reflux Study Patient Name:  [name]  Date of Exam:   11/07/2020 Medical Rec #: [id number]          Accession #:    [id number] Date of Birth: 1950/11/11          Patient Gender: M Patient Age:   70Y  Exam Location:  [facility] Procedure:      VAS [place] LOWER EXTREMITY VENOUS REFLUX Referring Phys: [id number] Dolores Lory [place] --------------------------------------------------------------------------------  Indications: Venous Stasis Lt Leg.  Performing Technologist: Almira Coaster RVS  Examination Guidelines: A complete evaluation includes B-mode imaging, spectral Doppler, color Doppler, and power Doppler as needed of all accessible portions of each vessel. Bilateral testing is considered an integral part of a complete examination. Limited examinations for reoccurring indications may be performed as noted. The reflux portion of the exam is performed with the patient in reverse Trendelenburg. Significant venous reflux is defined as >500 ms in the superficial venous system, and >1 second in the deep venous system.  +--------------+---------+------+-----------+------------+--------+ LEFT          Reflux NoRefluxReflux TimeDiameter cmsComments                         Yes                                  +--------------+---------+------+-----------+------------+--------+ CFV           no                                             +--------------+---------+------+-----------+------------+--------+ FV prox       no                                             +--------------+---------+------+-----------+------------+--------+  FV mid        no                                             +--------------+---------+------+-----------+------------+--------+ FV dist       no                                             +--------------+---------+------+-----------+------------+--------+ Popliteal     no                                             +--------------+---------+------+-----------+------------+--------+ GSV at SFJ    no                            .58              +--------------+---------+------+-----------+------------+--------+ GSV prox thighno                             .42              +--------------+---------+------+-----------+------------+--------+ GSV mid thigh no                            .61              +--------------+---------+------+-----------+------------+--------+ GSV dist thighno                            .64              +--------------+---------+------+-----------+------------+--------+ GSV at knee   no                            .56              +--------------+---------+------+-----------+------------+--------+ GSV prox calf no                            .64              +--------------+---------+------+-----------+------------+--------+ SSV Pop Fossa no                            .24              +--------------+---------+------+-----------+------------+--------+   Summary: Left: - No evidence of deep vein thrombosis seen in the left lower extremity, from the common femoral through the popliteal veins.  - No evidence of superficial venous thrombosis in the left lower extremity.  - There is no evidence of venous reflux seen in the left lower extremity.  - No evidence of superficial venous reflux seen in the left greater saphenous vein.  - No evidence of superficial venous reflux seen in the left short saphenous vein.  *See table(s) above for measurements and observations. Electronically signed by Hortencia Pilar MD on 11/07/2020 at 2:52:02 PM.    Final      Assessment/Plan 1. Chronic  venous insufficiency No surgery or intervention at this point in time.    I have had a long discussion with the patient regarding venous insufficiency and why it  causes symptoms. I have discussed with the patient the chronic skin changes that accompany venous insufficiency and the long term sequela such as infection and ulceration.  Patient should wear graduated compression stockings or compression wraps on a daily basis a prescription was given. The patient is instructed to put the garments on first thing in the  morning and removing them in the evening. The patient is instructed specifically not to sleep in the stockings.    In addition, behavioral modification including several periods of elevation of the lower extremities during the day will be continued. I have demonstrated that proper elevation is a position with the ankles at heart level.  The patient is instructed to begin routine exercise, especially walking on a daily basis.  I have requested he follow-up with me on an as-needed basis should his symptoms worsen or a new ulceration develop particularly in the ankle area   2. PAD (peripheral artery disease) (HCC) Recommend:  I do not find evidence of life style limiting vascular disease. The patient specifically denies life style limitation.  Previous noninvasive studies including ABI's of the legs do not identify critical vascular problems.  The patient should continue walking and begin a more formal exercise program. The patient should continue his antiplatelet therapy and aggressive treatment of the lipid abnormalities.  The patient should begin wearing graduated compression socks 15-20 mmHg strength to control her mild edema.  Patient will follow-up with me on a PRN basis   3. Coronary artery disease involving native coronary artery of native heart without angina pectoris Continue cardiac and antihypertensive medications as already ordered and reviewed, no changes at this time.  Continue statin as ordered and reviewed, no changes at this time  Nitrates PRN for chest pain   4. Paroxysmal atrial fibrillation (HCC) Continue antiarrhythmia medications as already ordered, these medications have been reviewed and there are no changes at this time.  Continue anticoagulation as ordered by Cardiology Service   5. Primary hypertension Continue antihypertensive medications as already ordered, these medications have been reviewed and there are no changes at this time.    Hortencia Pilar, MD  11/08/2020 5:26 PM

## 2020-11-23 ENCOUNTER — Ambulatory Visit (INDEPENDENT_AMBULATORY_CARE_PROVIDER_SITE_OTHER): Payer: Medicare Other | Admitting: Podiatry

## 2020-11-23 ENCOUNTER — Encounter: Payer: Self-pay | Admitting: Podiatry

## 2020-11-23 ENCOUNTER — Other Ambulatory Visit: Payer: Self-pay

## 2020-11-23 DIAGNOSIS — L97522 Non-pressure chronic ulcer of other part of left foot with fat layer exposed: Secondary | ICD-10-CM

## 2020-11-23 DIAGNOSIS — S9032XA Contusion of left foot, initial encounter: Secondary | ICD-10-CM

## 2020-11-28 ENCOUNTER — Encounter: Payer: Self-pay | Admitting: Podiatry

## 2020-11-28 NOTE — Progress Notes (Signed)
  Subjective:  Patient ID: [name], male    DOB: 09-08-1950,  MRN: [id number]  Follow up from surgery, has been applying collagen  DOS: 09/30/2020 Procedure: I&D of left foot hematoma  70 y.o. male returns for post-op check.  Overall doing well  Review of Systems: Negative except as noted in the HPI. Denies N/V/F/Ch.   Objective:  There were no vitals filed for this visit. There is no height or weight on file to calculate BMI. Constitutional Well developed. Well nourished.  Vascular Foot warm and well perfused. Capillary refill normal to all digits.   Neurologic Normal speech. Oriented to person, place, and time. Epicritic sensation to light touch grossly present bilaterally.  Dermatologic  Open ulcer measures 1.2 x 0.2 x 0.3cm  from lateral incision, granular wound bed no signs of infection  Orthopedic: Tenderness to palpation noted about the surgical site.    Assessment:   1. Ulcer of left foot with fat layer exposed ([place])   2. Hematoma of left foot    Plan:  Patient was evaluated and treated and all questions answered.  S/p foot surgery left  Ulcer left foot -Debridement as below. -Dressed with collagen silver, silicone foam border dressing.  Continue packing collagen every 72 hours at home  Procedure: Excisional Debridement of Wound Rationale: Removal of non-viable soft tissue from the wound to promote healing.  Anesthesia: none Post-Debridement Wound Measurements: 1.2 x 0.2 x 0.3cm Type of Debridement: Sharp Excisional Tissue Removed: Non-viable soft tissue Depth of Debridement: subcutaneous tissue. Technique: Sharp excisional debridement to bleeding, viable wound base.  Dressing: Dry, sterile, compression dressing. Disposition: Patient tolerated procedure well.      Return in about 4 weeks (around 12/21/2020) for wound care.

## 2020-12-06 ENCOUNTER — Other Ambulatory Visit: Payer: Self-pay | Admitting: Internal Medicine

## 2020-12-16 ENCOUNTER — Ambulatory Visit: Payer: Medicare Other | Admitting: Physician Assistant

## 2020-12-16 ENCOUNTER — Other Ambulatory Visit: Payer: Medicare Other

## 2020-12-21 ENCOUNTER — Ambulatory Visit: Payer: Medicare Other | Admitting: Podiatry

## 2021-01-08 NOTE — Progress Notes (Signed)
MRN : [id number]  [name] is a 70 y.o. (01-14-1951) male who presents with chief complaint of vein problems.  History of Present Illness:   Patient returns the office for follow-up evaluation of his lower extremity vascular disease.   He was admitted toward the end of April 2022 with a ulceration of the left foot he was noted to have fairly severe venous changes to both lower extremities in addition to nonpalpable pedal pulses.   Angiogram was performed Oct 04, 2020: This study did not identify any atherosclerotic hemodynamically significant lesions.  His anterior tibial is his dominant runoff filling the dorsalis pedis and the pedal arch.  No intervention was indicated.   He returns today for follow-up regarding the venous changes of his lower extremities.  He notes his wound has completely healed.   Duplex ultrasound of the venous system done today demonstrates patent deep venous system no deep reflux is identified.  The deep veins are compressible with phasic flow.  Superficial veins are patent without reflux.  No outpatient medications have been marked as taking for the 01/09/21 encounter (Appointment) with Delana Meyer, Dolores Lory, MD.    Past Medical History:  Diagnosis Date   CKD (chronic kidney disease) stage 3, GFR 30-59 ml/min (HCC)    Coronary artery disease    a. 1998 s/p ACS Multi-link BMS to the prox LAD ([place], [place]); b. 07/2015 Cath: LM nl, LAD patent stent, LCX min irregs, OM1/2 min irregs, OM3 nl, RCA min irregs; c. 07/2020 Cath: LM nl, LAD 50p/m, 70d, D2 70, LCX 40p/21m, OM3 40, RCA 60d->Med Rx.   Diabetes mellitus type 2, uncontrolled ([place])    a. A1c 11.0 in 07/2015.   HFrEF    a. EF 25% by cath in 2013; b. Echo in 07/2015 showing EF of 15-20%, moderate MR, moderate Pulm HTN, severely dilated IVC; c. 11/2016 Echo: EF 55-60%, no rwma, Gr1 DD, mildly dil LA, nl RV fxn. d. 12/2019 Echo: EF 40-45%, e. 07/2020 Echo: EF 20-25%, sev red RV fxn, mod dil LA, mild-mod MR.    History of kidney stones    Hypertension    Ischemic cardiomyopathy    Kidney stones    Left   Paroxysmal atrial fibrillation ([place])    a. Dx 07/2015; b. CHA2DS2VASc = 5-->eliquis. Rhythm control w/ amiodarone.   Pollen allergies 09-21-15   pt called and stated that he woke up and had some drainage-pt states he did not see the color of the drainage-pt denies running a fever and this only happened once-pt instructed to call Dr Audree Bane office if he starts running a fever or if the color of drainage becomes yellow/green   Psoriasis    Pulmonary hypertension ([place])    Renal disorder    kidney stone   Sleep apnea     Past Surgical History:  Procedure Laterality Date   CARDIAC CATHETERIZATION N/A 07/25/2015   Procedure: Right/Left Heart Cath and Coronary Angiography;  Surgeon: Wellington Hampshire, MD;  Location: [facility];  Service: Cardiovascular;  Laterality: N/A;   CARDIAC CATHETERIZATION     [place], [place]   CORONARY ANGIOPLASTY WITH STENT PLACEMENT     CYSTOSCOPY WITH STENT PLACEMENT Left 09/07/2015   Procedure: CYSTOSCOPY WITH STENT PLACEMENT;  Surgeon: Hollice Espy, MD;  Location: [facility];  Service: Urology;  Laterality: Left;   CYSTOSCOPY/URETEROSCOPY/HOLMIUM LASER/STENT PLACEMENT Left 10/25/2015   Procedure: CYSTOSCOPY/URETEROSCOPY/HOLMIUM LASER/STENT EXCHANGE;  Surgeon: Caryl Pina  Erlene Quan, MD;  Location: [facility];  Service: Urology;  Laterality: Left;   CYSTOSCOPY/URETEROSCOPY/HOLMIUM LASER/STENT PLACEMENT Left 05/11/2019   Procedure: CYSTOSCOPY/URETEROSCOPY/HOLMIUM LASER/STENT PLACEMENT;  Surgeon: Hollice Espy, MD;  Location: [facility];  Service: Urology;  Laterality: Left;   EXTRACORPOREAL SHOCK WAVE LITHOTRIPSY Left 04/02/2019   Procedure: EXTRACORPOREAL SHOCK WAVE LITHOTRIPSY (ESWL);  Surgeon: Hollice Espy, MD;  Location: [facility];  Service: Urology;  Laterality: Left;   EXTRACORPOREAL SHOCK WAVE LITHOTRIPSY Left  03/05/2019   Procedure: EXTRACORPOREAL SHOCK WAVE LITHOTRIPSY (ESWL);  Surgeon: Abbie Sons, MD;  Location: [facility];  Service: Urology;  Laterality: Left;   EYE SURGERY Bilateral    LASER FOR GLAUCOMA   IRRIGATION AND DEBRIDEMENT FOOT Left 09/30/2020   Procedure: IRRIGATION AND DEBRIDEMENT FOOT;  Surgeon: Criselda Peaches, DPM;  Location: [facility];  Service: Podiatry;  Laterality: Left;   KIDNEY SURGERY     LOWER EXTREMITY ANGIOGRAPHY Left 10/04/2020   Procedure: Lower Extremity Angiography;  Surgeon: Katha Cabal, MD;  Location: [facility];  Service: Cardiovascular;  Laterality: Left;   NEPHROSTOMY TUBE PLACEMENT ([place] [facility]) Left 11/2019   RIGHT/LEFT HEART CATH AND CORONARY ANGIOGRAPHY N/A 07/12/2020   Procedure: RIGHT/LEFT HEART CATH AND CORONARY ANGIOGRAPHY;  Surgeon: Nelva Bush, MD;  Location: [facility];  Service: Cardiovascular;  Laterality: N/A;   ROBOT ASSISTED LAPAROSCOPIC NEPHRECTOMY Left 12/10/2019   Procedure: XI ROBOTIC ASSISTED LAPAROSCOPIC NEPHRECTOMY;  Surgeon: Hollice Espy, MD;  Location: [facility];  Service: Urology;  Laterality: Left;   URETEROSCOPY WITH HOLMIUM LASER LITHOTRIPSY Left 09/07/2015   Procedure: URETEROSCOPY WITH HOLMIUM LASER LITHOTRIPSY;  Surgeon: Hollice Espy, MD;  Location: [facility];  Service: Urology;  Laterality: Left;    Social History Social History   Tobacco Use   Smoking status: Never   Smokeless tobacco: Never  Vaping Use   Vaping Use: Never used  Substance Use Topics   Alcohol use: Yes    Alcohol/week: 0.0 standard drinks    Comment: occasionally   Drug use: No    Types: Marijuana, Cocaine, Opium, LSD    Comment: Past, greater than 10 years ago    Family History Family History  Problem Relation Age of Onset   Heart failure Father    Heart attack Brother    Kidney cancer Neg Hx    Bladder Cancer Neg Hx    Prostate cancer Neg Hx     Allergies  Allergen Reactions   Other Hives and Other (See  Comments)    Blue cheese Blue cheese Blue cheese     REVIEW OF SYSTEMS (Negative unless checked)  Constitutional: [] Weight loss  [] Fever  [] Chills Cardiac: [] Chest pain   [] Chest pressure   [] Palpitations   [] Shortness of breath when laying flat   [] Shortness of breath with exertion. Vascular:  [] Pain in legs with walking   [] Pain in legs at rest  [] History of DVT   [] Phlebitis   [x] Swelling in legs   [] Varicose veins   [] Non-healing ulcers Pulmonary:   [] Uses home oxygen   [] Productive cough   [] Hemoptysis   [] Wheeze  [] COPD   [] Asthma Neurologic:  [] Dizziness   [] Seizures   [] History of stroke   [] History of TIA  [] Aphasia   [] Vissual changes   [] Weakness or numbness in arm   [] Weakness or numbness in leg Musculoskeletal:   [] Joint swelling   [] Joint pain   [] Low back pain Hematologic:  [] Easy bruising  [] Easy bleeding   [] Hypercoagulable state   [] Anemic Gastrointestinal:  []   Diarrhea   [] Vomiting  [] Gastroesophageal reflux/heartburn   [] Difficulty swallowing. Genitourinary:  [] Chronic kidney disease   [] Difficult urination  [] Frequent urination   [] Blood in urine Skin:  [x] Rashes   [] Ulcers  Psychological:  [] History of anxiety   []  History of major depression.  Physical Examination  There were no vitals filed for this visit. There is no height or weight on file to calculate BMI. Gen: WD/WN, NAD Head: [place]/AT, No temporalis wasting.  Ear/Nose/Throat: Hearing grossly intact, nares w/o erythema or drainage Eyes: PER, EOMI, sclera nonicteric.  Neck: Supple, no masses.  No bruit or JVD.  Pulmonary:  Good air movement, no audible wheezing, no use of accessory muscles.  Cardiac: RRR, normal S1, S2, no Murmurs. Vascular:   Severe venous stasis changes to the legs bilaterally.  1-2+ soft pitting edema  Vessel Right Left  Radial Palpable Palpable  PT Palpable Palpable  DP Palpable Palpable  Gastrointestinal: soft, non-distended. No guarding/no peritoneal signs.  Musculoskeletal: M/S 5/5  throughout.  No visible deformity.  Neurologic: CN 2-12 intact. Pain and light touch intact in extremities.  Symmetrical.  Speech is fluent. Motor exam as listed above. Psychiatric: Judgment intact, Mood & affect appropriate for pt's clinical situation. Dermatologic: No rashes or ulcers noted.  No changes consistent with cellulitis.   CBC Lab Results  Component Value Date   WBC 5.7 10/04/2020   HGB 10.8 (L) 10/04/2020   HCT 32.6 (L) 10/04/2020   MCV 95.9 10/04/2020   PLT 166 10/04/2020    BMET    Component Value Date/Time   NA 137 10/21/2020 1202   K 4.3 10/21/2020 1202   CL 91 (L) 10/21/2020 1202   CO2 26 10/21/2020 1202   GLUCOSE 253 (H) 10/21/2020 1202   GLUCOSE 229 (H) 10/04/2020 0442   BUN 66 (H) 10/21/2020 1202   CREATININE 2.17 (H) 10/21/2020 1202   CALCIUM 8.8 10/21/2020 1202   GFRNONAA 29 (L) 10/04/2020 0442   GFRAA 36 (L) 07/08/2020 1525   CrCl cannot be calculated (Patient's most recent lab result is older than the maximum 21 days allowed.).  COAG Lab Results  Component Value Date   INR 1.1 12/08/2019   INR 1.5 (H) 04/10/2019   INR 1.18 09/07/2015    Radiology No results found.   Assessment/Plan 1. Chronic venous insufficiency No surgery or intervention at this point in time.    I have reviewed my discussion with the patient regarding venous insufficiency and secondary lymph edema and why it  causes symptoms. I have discussed with the patient the chronic skin changes that accompany these problems and the long term sequela such as ulceration and infection.  Patient will continue wearing graduated compression on a daily basis a prescription was given to the patient to keep this updated. The patient will  put the stockings on first thing in the morning and removing them in the evening. The patient is instructed specifically not to sleep in the stockings.  In addition, behavioral modification including elevation during the day will be continued.  Diet and salt  restriction was also discussed.  Previous duplex ultrasound of the lower extremities shows normal deep venous system, superficial reflux was not present.   Following the review of the ultrasound the patient will follow up in 12 months to reassess the degree of swelling and the control that graduated compression is offering.   The patient can be assessed for a Lymph Pump at that time.  However, at this time the patient states they are  satisfied with the control compression and elevation is yielding.    2. PAD (peripheral artery disease) (HCC) Recommend:  I do not find evidence of life style limiting vascular disease. The patient specifically denies life style limitation.  Previous noninvasive studies including ABI's of the legs do not identify critical vascular problems.  The patient should continue walking and begin a more formal exercise program. The patient should continue his antiplatelet therapy and aggressive treatment of the lipid abnormalities.  3. Hyperlipidemia due to type 2 diabetes mellitus ([place]) Continue statin as ordered and reviewed, no changes at this time   4. DM (diabetes mellitus), type 2 with complications ([place]) Continue hypoglycemic medications as already ordered, these medications have been reviewed and there are no changes at this time.  Hgb A1C to be monitored as already arranged by primary service     Hortencia Pilar, MD  01/08/2021 2:41 PM

## 2021-01-09 ENCOUNTER — Ambulatory Visit (INDEPENDENT_AMBULATORY_CARE_PROVIDER_SITE_OTHER): Payer: Medicare Other | Admitting: Podiatry

## 2021-01-09 ENCOUNTER — Other Ambulatory Visit: Payer: Self-pay

## 2021-01-09 ENCOUNTER — Encounter (INDEPENDENT_AMBULATORY_CARE_PROVIDER_SITE_OTHER): Payer: Self-pay | Admitting: Vascular Surgery

## 2021-01-09 ENCOUNTER — Ambulatory Visit (INDEPENDENT_AMBULATORY_CARE_PROVIDER_SITE_OTHER): Payer: Medicare Other | Admitting: Vascular Surgery

## 2021-01-09 VITALS — BP 124/69 | HR 79 | Ht 72.0 in | Wt 268.0 lb

## 2021-01-09 DIAGNOSIS — E785 Hyperlipidemia, unspecified: Secondary | ICD-10-CM

## 2021-01-09 DIAGNOSIS — M79675 Pain in left toe(s): Secondary | ICD-10-CM | POA: Diagnosis not present

## 2021-01-09 DIAGNOSIS — B351 Tinea unguium: Secondary | ICD-10-CM

## 2021-01-09 DIAGNOSIS — I872 Venous insufficiency (chronic) (peripheral): Secondary | ICD-10-CM | POA: Diagnosis not present

## 2021-01-09 DIAGNOSIS — L97522 Non-pressure chronic ulcer of other part of left foot with fat layer exposed: Secondary | ICD-10-CM

## 2021-01-09 DIAGNOSIS — I2510 Atherosclerotic heart disease of native coronary artery without angina pectoris: Secondary | ICD-10-CM

## 2021-01-09 DIAGNOSIS — I739 Peripheral vascular disease, unspecified: Secondary | ICD-10-CM | POA: Diagnosis not present

## 2021-01-09 DIAGNOSIS — E1169 Type 2 diabetes mellitus with other specified complication: Secondary | ICD-10-CM | POA: Diagnosis not present

## 2021-01-09 DIAGNOSIS — E118 Type 2 diabetes mellitus with unspecified complications: Secondary | ICD-10-CM | POA: Diagnosis not present

## 2021-01-09 DIAGNOSIS — M79674 Pain in right toe(s): Secondary | ICD-10-CM | POA: Diagnosis not present

## 2021-01-10 NOTE — Progress Notes (Signed)
  Subjective:  Patient ID: [name], male    DOB: 08/18/1950,  MRN: [id number]  Follow up from surgery, wound has healed   DOS: 09/30/2020 Procedure: I&D of left foot hematoma  70 y.o. male returns for post-op check.  Overall doing well.  Since last visit the wound is healed and his nails are thickened and elongated, he often bleeds when he tries to cut them himself  Review of Systems: Negative except as noted in the HPI. Denies N/V/F/Ch.   Objective:  There were no vitals filed for this visit. There is no height or weight on file to calculate BMI. Constitutional Well developed. Well nourished.  Vascular Foot warm and well perfused. Capillary refill normal to all digits.   Neurologic Normal speech. Oriented to person, place, and time. Epicritic sensation to light touch grossly present bilaterally.  Dermatologic Wound is healed.  He has thickened elongated toenails x10 bilaterally with subungual brain yellow discoloration  Orthopedic: Tenderness to palpation noted about the surgical site.    Assessment:   1. Ulcer of left foot with fat layer exposed ([place])   2. Pain due to onychomycosis of toenails of both feet   3. DM (diabetes mellitus), type 2 with complications [facility])    Plan:  Patient was evaluated and treated and all questions answered.  S/p foot surgery left  Ulcer left foot -Wound is healed he is doing well overall can resume regular bathing here which she has done and apply lotion along the prior scar.  Patient educated on diabetes. Discussed proper diabetic foot care and discussed risks and complications of disease. Educated patient in depth on reasons to return to the office immediately should he/she discover anything concerning or new on the feet. All questions answered. Discussed proper shoes as well.   D3U and metabolic panel ordered he has not had this checked recently  Discussed the etiology and treatment options for the condition in detail with the  patient. Educated patient on the topical and oral treatment options for mycotic nails. Recommended debridement of the nails today. Sharp and mechanical debridement performed of all painful and mycotic nails today. Nails debrided in length and thickness using a nail nipper to level of comfort. Discussed treatment options including appropriate shoe gear. Follow up as needed for painful nails.       Return in about 3 months (around 04/11/2021) for at risk diabetic foot care.

## 2021-01-12 LAB — BASIC METABOLIC PANEL
BUN/Creatinine Ratio: 27 — ABNORMAL HIGH (ref 10–24)
BUN: 66 mg/dL — ABNORMAL HIGH (ref 8–27)
CO2: 34 mmol/L — ABNORMAL HIGH (ref 20–29)
Calcium: 9.6 mg/dL (ref 8.6–10.2)
Chloride: 85 mmol/L — ABNORMAL LOW (ref 96–106)
Creatinine, Ser: 2.45 mg/dL — ABNORMAL HIGH (ref 0.76–1.27)
Glucose: 496 mg/dL — ABNORMAL HIGH (ref 65–99)
Potassium: 3.9 mmol/L (ref 3.5–5.2)
Sodium: 137 mmol/L (ref 134–144)
eGFR: 28 mL/min/{1.73_m2} — ABNORMAL LOW (ref >59)

## 2021-01-12 LAB — HEMOGLOBIN A1C
Est. average glucose Bld gHb Est-mCnc: 246 mg/dL
Hgb A1c MFr Bld: 10.2 % — ABNORMAL HIGH (ref 4.8–5.6)

## 2021-01-20 ENCOUNTER — Other Ambulatory Visit: Payer: Self-pay

## 2021-01-20 ENCOUNTER — Telehealth (INDEPENDENT_AMBULATORY_CARE_PROVIDER_SITE_OTHER): Payer: Medicare Other | Admitting: Urology

## 2021-01-20 DIAGNOSIS — N183 Chronic kidney disease, stage 3 unspecified: Secondary | ICD-10-CM | POA: Diagnosis not present

## 2021-01-20 DIAGNOSIS — I2510 Atherosclerotic heart disease of native coronary artery without angina pectoris: Secondary | ICD-10-CM

## 2021-01-20 DIAGNOSIS — Z905 Acquired absence of kidney: Secondary | ICD-10-CM | POA: Diagnosis not present

## 2021-01-20 NOTE — Progress Notes (Signed)
This service is provided via telemedicine   No vital signs collected/recorded due to the encounter was a telemedicine visit.     Patient consents to a telephone visit:  yes    Names of all persons participating in the telemedicine service and their role in the encounter:  Fonnie Jarvis, CMA & Hollice Espy, MD

## 2021-01-22 NOTE — Progress Notes (Signed)
Virtual Visit via Video Note  I connected with [name] on 01/20/21 at  2:00 PM EDT by a video enabled telemedicine application and verified that I am speaking with the correct person using two identifiers.  Location: Patient: Home Provider: [place] office   I discussed the limitations of evaluation and management by telemedicine and the availability of in person appointments. The patient expressed understanding and agreed to proceed.  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  Today, he reports after his nephrectomy in 12/2019, he is done extremely well.  He no longer has any UTIs, flank pain, or any other issues.  He has been following closely with nephrology and has a new nephrologist with whom he is very pleased.  He occasionally has a twinge in his left lower quadrant and does not fact have a retained stone in his ureteral stone but this not been causing infection, dysuria, hematuria, or any other problems.  [name] not even sure that the twinge he occasionally gets in his left lower quadrant is related to the stone or not.  Since last year, he has had some issues with wound healing after foot trauma and has been followed by vascular surgery as well as podiatry.  [name] also seeing cardiology regularly.  He denies any voiding complaints.    Observations/Objective: Pleasant, interactive  Assessment and Plan:  1. S/p nephrectomy No further issues with stones, infections, bleeding, or any other sequela following left-sided nephrectomy  Right kidney is normal without stones  No issues from his retained left distal ureteral stump stone, will return if he has any issues related to this although do not suspect he will become symptomatic or have sequela from  this  Solitary kidney precautions reviewed again today  At this point, given that he has no further GU issues, we will see him as needed.  [name] agreeable this plan.  2. Stage 3 chronic kidney disease, unspecified whether stage 3a or 3b CKD ([place]) Most recent creatinine 2.45, GFR 28 which fluctuates  Recommend continued close follow-up with nephrology   Follow Up Instructions: As needed   I discussed the assessment and treatment plan with the patient. The patient was provided an opportunity to ask questions and all were answered. The patient agreed with the plan and demonstrated an understanding of the instructions.   The patient was advised to call back or seek an in-person evaluation if the symptoms worsen or if the condition fails to improve as anticipated.  I provided 15 minutes of non-face-to-face time during this encounter.   Hollice Espy, MD

## 2021-01-30 ENCOUNTER — Other Ambulatory Visit: Payer: Self-pay | Admitting: Internal Medicine

## 2021-02-02 ENCOUNTER — Other Ambulatory Visit: Payer: Self-pay

## 2021-02-02 ENCOUNTER — Ambulatory Visit (INDEPENDENT_AMBULATORY_CARE_PROVIDER_SITE_OTHER): Payer: Medicare Other | Admitting: Internal Medicine

## 2021-02-02 ENCOUNTER — Ambulatory Visit (INDEPENDENT_AMBULATORY_CARE_PROVIDER_SITE_OTHER): Payer: Medicare Other

## 2021-02-02 VITALS — BP 112/60 | HR 77 | Ht 72.0 in | Wt 269.0 lb

## 2021-02-02 DIAGNOSIS — I480 Paroxysmal atrial fibrillation: Secondary | ICD-10-CM | POA: Diagnosis not present

## 2021-02-02 DIAGNOSIS — N1832 Chronic kidney disease, stage 3b: Secondary | ICD-10-CM

## 2021-02-02 DIAGNOSIS — I5022 Chronic systolic (congestive) heart failure: Secondary | ICD-10-CM

## 2021-02-02 DIAGNOSIS — I2510 Atherosclerotic heart disease of native coronary artery without angina pectoris: Secondary | ICD-10-CM | POA: Diagnosis not present

## 2021-02-02 DIAGNOSIS — E785 Hyperlipidemia, unspecified: Secondary | ICD-10-CM

## 2021-02-02 DIAGNOSIS — E1169 Type 2 diabetes mellitus with other specified complication: Secondary | ICD-10-CM

## 2021-02-02 LAB — ECHOCARDIOGRAM LIMITED
Calc EF: 47.6 %
S' Lateral: 4.5 cm
Single Plane A2C EF: 38.1 %
Single Plane A4C EF: 54.7 %

## 2021-02-02 MED ORDER — PERFLUTREN LIPID MICROSPHERE
1.0000 mL | INTRAVENOUS | Status: AC | PRN
Start: 2021-02-02 — End: 2021-02-02
  Administered 2021-02-02: 4 mL via INTRAVENOUS

## 2021-02-02 MED ORDER — CARVEDILOL 12.5 MG PO TABS: 12.5000 mg | ORAL_TABLET | Freq: Two times a day (BID) | ORAL | 1 refills | Status: DC

## 2021-02-02 NOTE — Patient Instructions (Signed)
Medication Instructions:   Your physician has recommended you make the following change in your medication:   INCREASE Carvedilol 12.5 mg TWICE daily  *If you need a refill on your cardiac medications before your next appointment, please call your pharmacy*   Lab Work:  None ordered  Testing/Procedures:  None ordered   Follow-Up: At [facility], you and your health needs are our priority.  As part of our continuing mission to provide you with exceptional heart care, we have created designated Provider Care Teams.  These Care Teams include your primary Cardiologist (physician) and Advanced Practice Providers (APPs -  Physician Assistants and Nurse Practitioners) who all work together to provide you with the care you need, when you need it.  We recommend signing up for the patient portal called "MyChart".  Sign up information is provided on this After Visit Summary.  MyChart is used to connect with patients for Virtual Visits (Telemedicine).  Patients are able to view lab/test results, encounter notes, upcoming appointments, etc.  Non-urgent messages can be sent to your provider as well.   To learn more about what you can do with MyChart, go to [email or web address].com.    Your next appointment:   3 month(s)  The format for your next appointment:   In Person  Provider:   You may see Christopher End, MD or one of the following Advanced Practice Providers on your designated Care Team:   Christopher Berge, NP Ryan Dunn, PA-C Jacquelyn Visser, PA-C Cadence Furth, PA-C   

## 2021-02-02 NOTE — Progress Notes (Signed)
Follow-up Outpatient Visit Date: 02/02/2021  Primary Care Provider: Vidal Schwalbe, MD [address] [place] [address]  Chief Complaint: Follow-up heart failure  HPI:  [name] is a 70 y.o. male with history of chronic HFrEF, CAD status post PCI to the proximal LAD, paroxysmal atrial fibrillation, chronic kidney disease, hypertension, and type 2 diabetes mellitus, who presents for follow-up of heart failure.  I last saw him in May, at which time he was feeling well with relatively stable weights around 255 pounds.  He noted minimal exertional dyspnea and absence of chest pain.  Today, Mr. Toral reports feeling fairly well.  He has noticed his weight trending up, which he attributes to being less active.  He does not believe that he is retaining much fluid though he has increased his diuretic regimen to torsemide 40 mg daily and metolazone 2.5 mg daily (had been taking metolazone just 3 days a week).  Recent labs showed slight worsening of his renal function (creatinine 2.2 -> 2.5).  [name] scheduled for follow-up with his nephrologist next week.  He has not had any chest pain, palpitations, or lightheadedness.  Leg edema has been minimal.  His breathing has been "okay.  He reports a fall a few weeks ago due to slipping while going to get the mail.  He had multiple abrasions on his arms and the legs but no serious injury.  --------------------------------------------------------------------------------------------------  Past Medical History:  Diagnosis Date   CKD (chronic kidney disease) stage 3, GFR 30-59 ml/min (HCC)    Coronary artery disease    a. 1998 s/p ACS Multi-link BMS to the prox LAD ([place], [place]); b. 07/2015 Cath: LM nl, LAD patent stent, LCX min irregs, OM1/2 min irregs, OM3 nl, RCA min irregs; c. 07/2020 Cath: LM nl, LAD 50p/m, 70d, D2 70, LCX 40p/58m, OM3 40, RCA 60d->Med Rx.   Diabetes mellitus type 2, uncontrolled ([place])    a. A1c 11.0 in 07/2015.   HFrEF    a. EF  25% by cath in 2013; b. Echo in 07/2015 showing EF of 15-20%, moderate MR, moderate Pulm HTN, severely dilated IVC; c. 11/2016 Echo: EF 55-60%, no rwma, Gr1 DD, mildly dil LA, nl RV fxn. d. 12/2019 Echo: EF 40-45%, e. 07/2020 Echo: EF 20-25%, sev red RV fxn, mod dil LA, mild-mod MR.   History of kidney stones    Hypertension    Ischemic cardiomyopathy    Kidney stones    Left   Paroxysmal atrial fibrillation ([place])    a. Dx 07/2015; b. CHA2DS2VASc = 5-->eliquis. Rhythm control w/ amiodarone.   Pollen allergies 09-21-15   pt called and stated that he woke up and had some drainage-pt states he did not see the color of the drainage-pt denies running a fever and this only happened once-pt instructed to call Dr Audree Bane office if he starts running a fever or if the color of drainage becomes yellow/green   Psoriasis    Pulmonary hypertension ([place])    Renal disorder    kidney stone   Sleep apnea    Past Surgical History:  Procedure Laterality Date   CARDIAC CATHETERIZATION N/A 07/25/2015   Procedure: Right/Left Heart Cath and Coronary Angiography;  Surgeon: Wellington Hampshire, MD;  Location: [facility];  Service: Cardiovascular;  Laterality: N/A;   [place], [place]   CORONARY ANGIOPLASTY WITH STENT PLACEMENT  CYSTOSCOPY WITH STENT PLACEMENT Left 09/07/2015   Procedure: CYSTOSCOPY WITH STENT PLACEMENT;  Surgeon: Hollice Espy, MD;  Location: [facility];  Service: Urology;  Laterality: Left;   CYSTOSCOPY/URETEROSCOPY/HOLMIUM LASER/STENT PLACEMENT Left 10/25/2015   Procedure: CYSTOSCOPY/URETEROSCOPY/HOLMIUM LASER/STENT EXCHANGE;  Surgeon: Hollice Espy, MD;  Location: [facility];  Service: Urology;  Laterality: Left;   CYSTOSCOPY/URETEROSCOPY/HOLMIUM LASER/STENT PLACEMENT Left 05/11/2019   Procedure: CYSTOSCOPY/URETEROSCOPY/HOLMIUM LASER/STENT PLACEMENT;  Surgeon: Hollice Espy, MD;  Location: [facility];  Service: Urology;   Laterality: Left;   EXTRACORPOREAL SHOCK WAVE LITHOTRIPSY Left 04/02/2019   Procedure: EXTRACORPOREAL SHOCK WAVE LITHOTRIPSY (ESWL);  Surgeon: Hollice Espy, MD;  Location: [facility];  Service: Urology;  Laterality: Left;   EXTRACORPOREAL SHOCK WAVE LITHOTRIPSY Left 03/05/2019   Procedure: EXTRACORPOREAL SHOCK WAVE LITHOTRIPSY (ESWL);  Surgeon: Abbie Sons, MD;  Location: [facility];  Service: Urology;  Laterality: Left;   EYE SURGERY Bilateral    LASER FOR GLAUCOMA   IRRIGATION AND DEBRIDEMENT FOOT Left 09/30/2020   Procedure: IRRIGATION AND DEBRIDEMENT FOOT;  Surgeon: Criselda Peaches, DPM;  Location: [facility];  Service: Podiatry;  Laterality: Left;   KIDNEY SURGERY     LOWER EXTREMITY ANGIOGRAPHY Left 10/04/2020   Procedure: Lower Extremity Angiography;  Surgeon: Katha Cabal, MD;  Location: [place] [facility];  Service: Cardiovascular;  Laterality: Left;   NEPHROSTOMY TUBE PLACEMENT ([place] [facility]) Left 11/2019   RIGHT/LEFT HEART CATH AND CORONARY ANGIOGRAPHY N/A 07/12/2020   Procedure: RIGHT/LEFT HEART CATH AND CORONARY ANGIOGRAPHY;  Surgeon: Nelva Bush, MD;  Location: [place] [facility];  Service: Cardiovascular;  Laterality: N/A;   ROBOT ASSISTED LAPAROSCOPIC NEPHRECTOMY Left 12/10/2019   Procedure: XI ROBOTIC ASSISTED LAPAROSCOPIC NEPHRECTOMY;  Surgeon: Hollice Espy, MD;  Location: [facility];  Service: Urology;  Laterality: Left;   URETEROSCOPY WITH HOLMIUM LASER LITHOTRIPSY Left 09/07/2015   Procedure: URETEROSCOPY WITH HOLMIUM LASER LITHOTRIPSY;  Surgeon: Hollice Espy, MD;  Location: [facility];  Service: Urology;  Laterality: Left;    Current Meds  Medication Sig   amiodarone (PACERONE) 200 MG tablet Take 1 tablet (200 mg total) by mouth daily.   apixaban (ELIQUIS) 5 MG TABS tablet Take 1 tablet (5 mg total) 2 (two) times daily by mouth.   Bromelains (BROMELAIN PO) Take 2 capsules by mouth daily.   CINNAMON PO Take 1,200 mg by mouth in the morning and at bedtime. [place]  Cinnamon 1200mg    clonazePAM (KLONOPIN) 1 MG tablet Take 1 mg by mouth See admin instructions. Take 1 tablet (1 mg) by mouth scheduled at bedtime, may take an additional dose during the day if needed for anxiety   Coenzyme Q10 (COQ10) 400 MG CAPS Take 400 mg by mouth 2 (two) times daily.   docusate sodium (COLACE) 100 MG capsule Take 300 mg in the am & 200 mg in the pm.   Dulaglutide 3 MG/0.5ML SOPN Inject into the skin.   glipiZIDE (GLUCOTROL) 10 MG tablet Take 10 mg by mouth 2 (two) times daily before a meal.   HAWTHORNE BERRY PO Take 565 mg by mouth at bedtime.   Insulin Glargine (TOUJEO SOLOSTAR [place]) Inject 28 Units into the skin as needed.   JARDIANCE 10 MG TABS tablet Take 10 mg by mouth every evening.    levothyroxine (SYNTHROID) 50 MCG tablet Take by mouth daily at 12 noon.   losartan (COZAAR) 25 MG tablet Take 0.5 tablets (12.5 mg total) by mouth daily.   magnesium oxide (MAG-OX) 400 (241.3 Mg) MG tablet Take 1 tablet (400 mg total)  by mouth daily.   MELATONIN-THEANINE PO Take 2 capsules by mouth at bedtime. Ol[place] Sleep Gummies   metolazone (ZAROXOLYN) 2.5 MG tablet TAKE 1 TABLET BY MOUTH ONCE DAILY IN THE MORNING. TAKE ALONG WITH YOUR MORNING DOSE OF TORSEMIDE.   Multiple Vitamins-Minerals (PRESERVISION AREDS 2 PO) Take 1 tablet by mouth in the morning and at bedtime.   potassium chloride SA (KLOR-CON) 20 MEQ tablet Take 2 tablets (40 mEq total) by mouth daily.   rosuvastatin (CRESTOR) 20 MG tablet Take 20 mg by mouth every evening.    Torsemide 40 MG TABS Take 40 mg by mouth daily.   valACYclovir (VALTREX) 1000 MG tablet Take 1,000 mg by mouth daily as needed.   vitamin C (ASCORBIC ACID) 500 MG tablet Take 500 mg by mouth at bedtime.   [DISCONTINUED] carvedilol (COREG) 6.25 MG tablet Take 1 tablet (6.25 mg total) by mouth 2 (two) times daily with a meal.   [DISCONTINUED] torsemide (DEMADEX) 20 MG tablet TAKE (2) TABLETS BY MOUTH TWICE DAILY.    Allergies: Other  Social History    Tobacco Use   Smoking status: Never   Smokeless tobacco: Never  Vaping Use   Vaping Use: Never used  Substance Use Topics   Alcohol use: Yes    Alcohol/week: 0.0 standard drinks    Comment: occasionally   Drug use: No    Types: Marijuana, Cocaine, Opium, LSD    Comment: Past, greater than 10 years ago    Family History  Problem Relation Age of Onset   Heart failure Father    Heart attack Brother    Kidney cancer Neg Hx    Bladder Cancer Neg Hx    Prostate cancer Neg Hx     Review of Systems: A 12-system review of systems was performed and was negative except as noted in the HPI.  --------------------------------------------------------------------------------------------------  Physical Exam: BP 112/60   Pulse 77   Ht 6' (1.829 m)   Wt 269 lb (122 kg)   SpO2 97%   BMI 36.48 kg/m   General:  NAD. Neck: No gross JVD, though body habitus limits evaluation Lungs: Clear to auscultation bilaterally without wheezes or crackles. Heart: Regular rate and rhythm without murmurs, rubs, or gallops. Abdomen: Obese and firm abdomen.  Nontender. Extremities: Trace pretibial edema bilaterally.  EKG: Normal sinus rhythm with first-degree AV block, left axis deviation, nonspecific intraventricular conduction delay, poor R wave progression, and borderline LVH.  No significant change from prior tracing on 10/27/2020.  Lab Results  Component Value Date   WBC 5.7 10/04/2020   HGB 10.8 (L) 10/04/2020   HCT 32.6 (L) 10/04/2020   MCV 95.9 10/04/2020   PLT 166 10/04/2020    Lab Results  Component Value Date   NA 137 01/11/2021   K 3.9 01/11/2021   CL 85 (L) 01/11/2021   CO2 34 (H) 01/11/2021   BUN 66 (H) 01/11/2021   CREATININE 2.45 (H) 01/11/2021   GLUCOSE 496 (H) 01/11/2021   ALT 20 09/26/2020    Lab Results  Component Value Date   CHOL 153 07/09/2015   HDL 34 (L) 07/09/2015   LDLCALC 85 07/09/2015   TRIG 168 (H) 07/09/2015   CHOLHDL 4.5 07/09/2015     --------------------------------------------------------------------------------------------------  ASSESSMENT AND PLAN: Chronic HFrEF: Mr. Eddie Dibbles denies worsening symptoms though his weight has trended up 10 pounds since our last visit in May.  This is occurred despite increasing frequency of metolazone.  His renal function has also worsened slightly since May.  I suspect that he is retaining some additional fluid though I am reluctant to further escalate his diuretic regimen.  Notably, his LVEF has improved since February, now 40-45%.  We have discussed further optimization of his goal-directed medical therapy and have agreed to increase carvedilol to 12.5 mg twice daily.  We will need to continue to monitor his mild first-degree AV block closely.  If renal function stabilizes, further escalation of losartan will need to be considered.  We will also continue current dose of empagliflozin.  I am reluctant to add spironolactone in the setting of CKD and creatinine around 2.5 on last check.  Paroxysmal atrial fibrillation: EKG today again shows sinus rhythm.  We will continue with amiodarone and apixaban.  Coronary artery disease: No angina reported.  Continue current medications for secondary prevention.  Chronic kidney disease stage 3b: Continue current diuretic regimen as well as low-dose losartan.  Mr. Kana should continue to follow closely with his PCP and nephrologist.  Hyperlipidemia associated with type 2 diabetes mellitus: Continue rosuvastatin 20 mg daily with ongoing monitoring by Dr. Bartolo Darter (goal LDL < 70).  Morbid obesity: Weight loss encouraged through diet and exercise.  Follow-up: Return to clinic in 3 months.  Nelva Bush, MD 02/03/2021 6:52 AM

## 2021-02-03 ENCOUNTER — Encounter: Payer: Self-pay | Admitting: Internal Medicine

## 2021-03-28 ENCOUNTER — Other Ambulatory Visit: Payer: Self-pay | Admitting: Internal Medicine

## 2021-04-12 ENCOUNTER — Other Ambulatory Visit: Payer: Self-pay

## 2021-04-12 ENCOUNTER — Ambulatory Visit (INDEPENDENT_AMBULATORY_CARE_PROVIDER_SITE_OTHER): Payer: Medicare Other | Admitting: Podiatry

## 2021-04-12 DIAGNOSIS — M79675 Pain in left toe(s): Secondary | ICD-10-CM

## 2021-04-12 DIAGNOSIS — M79674 Pain in right toe(s): Secondary | ICD-10-CM | POA: Diagnosis not present

## 2021-04-12 DIAGNOSIS — B351 Tinea unguium: Secondary | ICD-10-CM | POA: Diagnosis not present

## 2021-04-12 DIAGNOSIS — E118 Type 2 diabetes mellitus with unspecified complications: Secondary | ICD-10-CM

## 2021-04-16 ENCOUNTER — Encounter: Payer: Self-pay | Admitting: Podiatry

## 2021-04-16 NOTE — Progress Notes (Signed)
  Subjective:  Patient ID: [name], male    DOB: 10-21-1950,  MRN: [id number]    70 y.o. male returns for at risk diabetic foot care overall doing well.  Since last visit the wound is healed and his nails are thickened and elongated, he often bleeds when he tries to cut them himself  Review of Systems: Negative except as noted in the HPI. Denies N/V/F/Ch.   Objective:  There were no vitals filed for this visit. There is no height or weight on file to calculate BMI. Constitutional Well developed. Well nourished.  Vascular Foot warm and well perfused. Capillary refill normal to all digits.   Neurologic Normal speech. Oriented to person, place, and time. Epicritic sensation to light touch grossly present bilaterally.  Dermatologic Wound is healed.  He has thickened elongated toenails x10 bilaterally with subungual brain yellow discoloration.  Left hallux nail is loose and easily removable  Orthopedic: Tenderness to palpation noted about the surgical site.    Assessment:   1. Pain due to onychomycosis of toenails of both feet   2. DM (diabetes mellitus), type 2 with complications [facility])     Plan:  Patient was evaluated and treated and all questions answered.   Patient educated on diabetes. Discussed proper diabetic foot care and discussed risks and complications of disease. Educated patient in depth on reasons to return to the office immediately should he/she discover anything concerning or new on the feet. All questions answered. Discussed proper shoes as well.   Last A1c 10.2%, advised him to work on his blood sugar and he will discuss this with his endocrinologist at his next visit  Discussed the etiology and treatment options for the condition in detail with the patient.  Recommended debridement of the nails today. Sharp and mechanical debridement performed of all painful and mycotic nails today. Nails debrided in length and thickness using a nail nipper to level of  comfort. Discussed treatment options including appropriate shoe gear. Follow up as needed for painful nails.       Return in about 3 months (around 07/13/2021) for diabetic nail and callus trim.

## 2021-04-25 ENCOUNTER — Ambulatory Visit (LOCAL_COMMUNITY_HEALTH_CENTER): Payer: Self-pay

## 2021-04-25 ENCOUNTER — Other Ambulatory Visit: Payer: Self-pay

## 2021-04-25 DIAGNOSIS — Z719 Counseling, unspecified: Secondary | ICD-10-CM

## 2021-04-25 DIAGNOSIS — Z23 Encounter for immunization: Secondary | ICD-10-CM

## 2021-04-25 NOTE — Progress Notes (Signed)
Have you been in close physical contact with someone diagnosed with MonkeyPox in the past 14 days. NO For men who have sex with men (MSM), or transgender individuals, did you in the last 90 days:  Have multiple or anonymous sex partners? NO Receive a diagnosis of a sexually transmitted infection? NO Receive HIV pre-exposure prophylaxis (PrEP)? NO Are you having any symptoms such as a new rash, lesions, or bumps? NO Do you have allergies to egg protein, gentamicin, or ciprofloxacin? NO Do you currently take Deflazacort (Calcort)? NO Do you currently take any of the precautionary medications listed: Saphnelo (Anifrolumab-fnia) Ilaris (Canakinumab) Dupixent (Dupilumab) Gilenya (Fingolimed) Taltz (Ixekizumab) Zeposia (Ozanimod) Ponvory (Ponesimod) Cosentix (Secukinumab) Mayzent (Siponimod) Adbry (Tralokinumab-Idrm) Lupkynis (Voclosporin) Have you had a previous dose of Smallpox vaccine in the last 3 years? NO Do you have a history of Keloid scarring? NO Jynneos vaccine is a two-dose series. Which dose is this? First      Patient in nurse clinic for Monkeypox vaccine first dose. Tolerated well. [facility] copy given to patient. Ranae Palms, RN

## 2021-05-18 ENCOUNTER — Ambulatory Visit (INDEPENDENT_AMBULATORY_CARE_PROVIDER_SITE_OTHER): Payer: Medicare Other | Admitting: Internal Medicine

## 2021-05-18 ENCOUNTER — Encounter: Payer: Self-pay | Admitting: Internal Medicine

## 2021-05-18 ENCOUNTER — Other Ambulatory Visit: Payer: Self-pay

## 2021-05-18 VITALS — BP 100/60 | HR 75 | Ht 72.0 in | Wt 279.0 lb

## 2021-05-18 DIAGNOSIS — E785 Hyperlipidemia, unspecified: Secondary | ICD-10-CM

## 2021-05-18 DIAGNOSIS — E1169 Type 2 diabetes mellitus with other specified complication: Secondary | ICD-10-CM

## 2021-05-18 DIAGNOSIS — N1832 Chronic kidney disease, stage 3b: Secondary | ICD-10-CM | POA: Diagnosis not present

## 2021-05-18 DIAGNOSIS — I480 Paroxysmal atrial fibrillation: Secondary | ICD-10-CM | POA: Diagnosis not present

## 2021-05-18 DIAGNOSIS — I2510 Atherosclerotic heart disease of native coronary artery without angina pectoris: Secondary | ICD-10-CM | POA: Diagnosis not present

## 2021-05-18 DIAGNOSIS — I5022 Chronic systolic (congestive) heart failure: Secondary | ICD-10-CM | POA: Diagnosis not present

## 2021-05-18 DIAGNOSIS — Z79899 Other long term (current) drug therapy: Secondary | ICD-10-CM

## 2021-05-18 MED ORDER — METOLAZONE 2.5 MG PO TABS
2.5000 mg | ORAL_TABLET | Freq: Every day | ORAL | Status: DC
Start: 2021-05-18 — End: 2021-07-24

## 2021-05-18 NOTE — Patient Instructions (Signed)
Medication Instructions:   Your physician has recommended you make the following change in your medication:   TAKE Metolazone 2.5 mg daily   *If you need a refill on your cardiac medications before your next appointment, please call your pharmacy*   Lab Work:  CMET, CBC, TSH, Free T4 - Printed lab orders given to be completed with PCP ([facility])  Testing/Procedures:  None ordered   Follow-Up: At [facility], you and your health needs are our priority.  As part of our continuing mission to provide you with exceptional heart care, we have created designated Provider Care Teams.  These Care Teams include your primary Cardiologist (physician) and Advanced Practice Providers (APPs -  Physician Assistants and Nurse Practitioners) who all work together to provide you with the care you need, when you need it.  We recommend signing up for the patient portal called "MyChart".  Sign up information is provided on this After Visit Summary.  MyChart is used to connect with patients for Virtual Visits (Telemedicine).  Patients are able to view lab/test results, encounter notes, upcoming appointments, etc.  Non-urgent messages can be sent to your provider as well.   To learn more about what you can do with MyChart, go to [email or web address].    Your next appointment:   3 month(s)  The format for your next appointment:   In Person  Provider:   You may see Nelva Bush, MD or one of the following Advanced Practice Providers on your designated Care Team:   Murray Hodgkins, NP Christell Faith, PA-C Cadence Kathlen Mody, [place]

## 2021-05-18 NOTE — Progress Notes (Signed)
Follow-up Outpatient Visit Date: 05/18/2021  Primary Care Provider: Vidal Schwalbe, MD [address] [place]  [address]  Chief Complaint: Follow-up coronary artery disease and chronic HFrEF  HPI:  [name] is a 69 y.o. male with history of chronic HFrEF, CAD status post PCI to the proximal LAD, paroxysmal atrial fibrillation, chronic kidney disease, hypertension, and type 2 diabetes mellitus, who presents for follow-up of heart failure.  I last saw him in September, at which time he was feeling fairly well though his weight was beginning to creep up.  Today, [name] continues to be without significant complaints including shortness of breath, chest pain, palpitations, and edema.  He notes occasional transient lightheadedness but has not fallen or passed out.  His weight has continued to trend up gradually and is now usually in the low 270s.  Due to constipation, he decreased metolazone frequency to 4 times a week.  He notes that his Trulicity was increased last month at his endocrinology visit.  --------------------------------------------------------------------------------------------------  Past Medical History:  Diagnosis Date   CKD (chronic kidney disease) stage 3, GFR 30-59 ml/min (HCC)    Coronary artery disease    a. 1998 s/p ACS Multi-link BMS to the prox LAD ([place], [place]); b. 07/2015 Cath: LM nl, LAD patent stent, LCX min irregs, OM1/2 min irregs, OM3 nl, RCA min irregs; c. 07/2020 Cath: LM nl, LAD 50p/m, 70d, D2 70, LCX 40p/72m, OM3 40, RCA 60d->Med Rx.   Diabetes mellitus type 2, uncontrolled    a. A1c 11.0 in 07/2015.   HFrEF    a. EF 25% by cath in 2013; b. Echo in 07/2015 showing EF of 15-20%, moderate MR, moderate Pulm HTN, severely dilated IVC; c. 11/2016 Echo: EF 55-60%, no rwma, Gr1 DD, mildly dil LA, nl RV fxn. d. 12/2019 Echo: EF 40-45%, e. 07/2020 Echo: EF 20-25%, sev red RV fxn, mod dil LA, mild-mod MR.   History of kidney stones    Hypertension     Ischemic cardiomyopathy    Kidney stones    Left   Paroxysmal atrial fibrillation ([place])    a. Dx 07/2015; b. CHA2DS2VASc = 5-->eliquis. Rhythm control w/ amiodarone.   Pollen allergies 09-21-15   pt called and stated that he woke up and had some drainage-pt states he did not see the color of the drainage-pt denies running a fever and this only happened once-pt instructed to call Dr Audree Bane office if he starts running a fever or if the color of drainage becomes yellow/green   Psoriasis    Pulmonary hypertension ([place])    Renal disorder    kidney stone   Sleep apnea    Past Surgical History:  Procedure Laterality Date   CARDIAC CATHETERIZATION N/A 07/25/2015   Procedure: Right/Left Heart Cath and Coronary Angiography;  Surgeon: Wellington Hampshire, MD;  Location: [facility];  Service: Cardiovascular;  Laterality: N/A;   CARDIAC CATHETERIZATION     [place], [place]   CORONARY ANGIOPLASTY WITH STENT PLACEMENT     CYSTOSCOPY WITH STENT PLACEMENT Left 09/07/2015   Procedure: CYSTOSCOPY WITH STENT PLACEMENT;  Surgeon: Hollice Espy, MD;  Location: [facility];  Service: Urology;  Laterality: Left;   CYSTOSCOPY/URETEROSCOPY/HOLMIUM LASER/STENT PLACEMENT Left 10/25/2015   Procedure: CYSTOSCOPY/URETEROSCOPY/HOLMIUM LASER/STENT EXCHANGE;  Surgeon: Hollice Espy, MD;  Location: [facility];  Service: Urology;  Laterality: Left;   CYSTOSCOPY/URETEROSCOPY/HOLMIUM LASER/STENT PLACEMENT Left 05/11/2019   Procedure: CYSTOSCOPY/URETEROSCOPY/HOLMIUM LASER/STENT PLACEMENT;  Surgeon:  Hollice Espy, MD;  Location: [facility];  Service: Urology;  Laterality: Left;   EXTRACORPOREAL SHOCK WAVE LITHOTRIPSY Left 04/02/2019   Procedure: EXTRACORPOREAL SHOCK WAVE LITHOTRIPSY (ESWL);  Surgeon: Hollice Espy, MD;  Location: [facility];  Service: Urology;  Laterality: Left;   EXTRACORPOREAL SHOCK WAVE LITHOTRIPSY Left 03/05/2019   Procedure: EXTRACORPOREAL SHOCK WAVE  LITHOTRIPSY (ESWL);  Surgeon: Abbie Sons, MD;  Location: [facility];  Service: Urology;  Laterality: Left;   EYE SURGERY Bilateral    LASER FOR GLAUCOMA   IRRIGATION AND DEBRIDEMENT FOOT Left 09/30/2020   Procedure: IRRIGATION AND DEBRIDEMENT FOOT;  Surgeon: Criselda Peaches, DPM;  Location: [facility];  Service: Podiatry;  Laterality: Left;   KIDNEY SURGERY     LOWER EXTREMITY ANGIOGRAPHY Left 10/04/2020   Procedure: Lower Extremity Angiography;  Surgeon: Katha Cabal, MD;  Location: [facility];  Service: Cardiovascular;  Laterality: Left;   NEPHROSTOMY TUBE PLACEMENT ([place] [facility]) Left 11/2019   RIGHT/LEFT HEART CATH AND CORONARY ANGIOGRAPHY N/A 07/12/2020   Procedure: RIGHT/LEFT HEART CATH AND CORONARY ANGIOGRAPHY;  Surgeon: Nelva Bush, MD;  Location: [facility];  Service: Cardiovascular;  Laterality: N/A;   ROBOT ASSISTED LAPAROSCOPIC NEPHRECTOMY Left 12/10/2019   Procedure: XI ROBOTIC ASSISTED LAPAROSCOPIC NEPHRECTOMY;  Surgeon: Hollice Espy, MD;  Location: [facility];  Service: Urology;  Laterality: Left;   URETEROSCOPY WITH HOLMIUM LASER LITHOTRIPSY Left 09/07/2015   Procedure: URETEROSCOPY WITH HOLMIUM LASER LITHOTRIPSY;  Surgeon: Hollice Espy, MD;  Location: [facility];  Service: Urology;  Laterality: Left;    Current Meds  Medication Sig   amiodarone (PACERONE) 200 MG tablet Take 1 tablet (200 mg total) by mouth daily.   apixaban (ELIQUIS) 5 MG TABS tablet Take 1 tablet (5 mg total) 2 (two) times daily by mouth.   Bromelains (BROMELAIN PO) Take 2 capsules by mouth daily.   carvedilol (COREG) 12.5 MG tablet Take 1 tablet (12.5 mg total) by mouth 2 (two) times daily with a meal.   CINNAMON PO Take 1,200 mg by mouth in the morning and at bedtime. [place] Cinnamon 1200mg    clonazePAM (KLONOPIN) 1 MG tablet Take 1 mg by mouth See admin instructions. Take 1 tablet (1 mg) by mouth scheduled at bedtime, may take an additional dose during the day if needed for anxiety    Coenzyme Q10 (COQ10) 400 MG CAPS Take 400 mg by mouth 2 (two) times daily.   docusate sodium (COLACE) 100 MG capsule Take 300 mg in the am & 200 mg in the pm.   Dulaglutide 4.5 MG/0.5ML SOPN Inject into the skin once a week.   glipiZIDE (GLUCOTROL) 10 MG tablet Take 10 mg by mouth 2 (two) times daily before a meal.   HAWTHORNE BERRY PO Take 565 mg by mouth at bedtime.   Insulin Glargine (TOUJEO SOLOSTAR [place]) Inject 84 Units into the skin as needed.   JARDIANCE 10 MG TABS tablet Take 10 mg by mouth every evening.    levothyroxine (SYNTHROID) 50 MCG tablet Take by mouth daily at 12 noon.   losartan (COZAAR) 25 MG tablet Take 0.5 tablets (12.5 mg total) by mouth daily.   magnesium oxide (MAG-OX) 400 (241.3 Mg) MG tablet Take 1 tablet (400 mg total) by mouth daily.   MELATONIN-THEANINE PO Take 2 capsules by mouth at bedtime. Olly Sleep Gummies   Multiple Vitamins-Minerals (PRESERVISION AREDS 2 PO) Take 1 tablet by mouth in the morning and at bedtime.   potassium chloride SA (KLOR-CON) 20 MEQ tablet Take  2 tablets (40 mEq total) by mouth daily.   rosuvastatin (CRESTOR) 20 MG tablet Take 20 mg by mouth every evening.    Torsemide 40 MG TABS Take 40 mg by mouth daily.   valACYclovir (VALTREX) 1000 MG tablet Take 1,000 mg by mouth daily as needed.   vitamin C (ASCORBIC ACID) 500 MG tablet Take 500 mg by mouth at bedtime.   zinc gluconate 50 MG tablet Take 50 mg by mouth daily.   [DISCONTINUED] metolazone (ZAROXOLYN) 2.5 MG tablet TAKE 1 TABLET BY MOUTH ONCE DAILY IN THE MORNING. TAKE ALONG WITH YOUR MORNING DOSE OF TORSEMIDE.    Allergies: Other  Social History   Tobacco Use   Smoking status: Never   Smokeless tobacco: Never  Vaping Use   Vaping Use: Never used  Substance Use Topics   Alcohol use: Yes    Alcohol/week: 0.0 standard drinks    Comment: occasionally   Drug use: No    Types: Marijuana, Cocaine, Opium, LSD    Comment: Past, greater than 10 years ago    Family History   Problem Relation Age of Onset   Heart failure Father    Heart attack Brother    Kidney cancer Neg Hx    Bladder Cancer Neg Hx    Prostate cancer Neg Hx     Review of Systems: A 12-system review of systems was performed and was negative except as noted in the HPI.  --------------------------------------------------------------------------------------------------  Physical Exam: BP 100/60 (BP Location: Left Arm, Patient Position: Sitting, Cuff Size: Large)    Pulse 75    Ht 6' (1.829 m)    Wt 279 lb (126.6 kg)    SpO2 97%    BMI 37.84 kg/m   General:  NAD. Neck: No JVD or HJR, though body habitus limits evaluation. Lungs: Clear to auscultation bilaterally without wheezes or crackles. Heart: Regular rate and rhythm without murmurs, rubs, or gallops. Abdomen: Soft, nontender, nondistended. Extremities: Trace pretibial edema bilaterally  EKG: Normal sinus rhythm with first-degree AV block (PR interval 270 ms), PACs, left axis deviation, and left bundle branch block.  PACs are now present.  Otherwise, no significant interval change from 02/02/2021.  Lab Results  Component Value Date   WBC 5.7 10/04/2020   HGB 10.8 (L) 10/04/2020   HCT 32.6 (L) 10/04/2020   MCV 95.9 10/04/2020   PLT 166 10/04/2020    Lab Results  Component Value Date   NA 137 01/11/2021   K 3.9 01/11/2021   CL 85 (L) 01/11/2021   CO2 34 (H) 01/11/2021   BUN 66 (H) 01/11/2021   CREATININE 2.45 (H) 01/11/2021   GLUCOSE 496 (H) 01/11/2021   ALT 20 09/26/2020    Lab Results  Component Value Date   CHOL 153 07/09/2015   HDL 34 (L) 07/09/2015   LDLCALC 85 07/09/2015   TRIG 168 (H) 07/09/2015   CHOLHDL 4.5 07/09/2015    --------------------------------------------------------------------------------------------------  ASSESSMENT AND PLAN: Chronic HFrEF: [name] reports stable NYHA class II symptom with minimal edema on exam, though his weight continues to trend up.  Volume assessment has been  challenging in the past and I worry that he is gradually reaccumulating fluid (particularly with decreased frequency of metolazone).  I have recommended escalation of metolazone back to 2.5 mg daily with his standing torsemide 40 mg daily.  We will have labs drawn at his PCP's office tomorrow to reassess his renal function and electrolytes.  Continue current GDMT, including carvedilol, losatan, and empagliflozin.  PR  interval slightly longer today than in 02/2021; will need to monitor closely with ongoing carvedilol and amiodarone use.  Paroxysmal atrial fibrillation: No palpitations reported.  EKG shows NSR with PAC's.  We will check LFT's and TFT's with tomorrows labs.  Continue amiodarone and apixaban 5 mg twice daily.  Coronary artery disease: [name] denies angina.  Continue current medications for secondary prevention.  Chronic kidney disease stage 3b: Continue current medications with exception of daily dosing of metolazone.  We will obtain labs tomorrow, as outlined above.  Continue close follow-up with PCP and nephrology.  Hyperlipidemia associated with type 2 diabetes mellitus: Continue high-intensity statin therapy for targed LDL < 70.  Ongoing management of DM per PCP and endocrinology.  Morbid obesity: BMI above 35 with multiple comorbidities, including DM, CAD, and HF.  Weight trending up.  Weight loss encouraged through diet and exercise.  Follow-up: Return to clinic in 3 months.  Nelva Bush, MD 05/20/2021 2:07 PM

## 2021-05-20 ENCOUNTER — Encounter: Payer: Self-pay | Admitting: Internal Medicine

## 2021-05-23 ENCOUNTER — Encounter: Payer: Self-pay | Admitting: Internal Medicine

## 2021-05-23 ENCOUNTER — Other Ambulatory Visit: Payer: Self-pay | Admitting: Internal Medicine

## 2021-05-23 DIAGNOSIS — I480 Paroxysmal atrial fibrillation: Secondary | ICD-10-CM

## 2021-05-23 DIAGNOSIS — Z79899 Other long term (current) drug therapy: Secondary | ICD-10-CM

## 2021-05-23 DIAGNOSIS — I5022 Chronic systolic (congestive) heart failure: Secondary | ICD-10-CM

## 2021-05-23 DIAGNOSIS — I2510 Atherosclerotic heart disease of native coronary artery without angina pectoris: Secondary | ICD-10-CM

## 2021-06-15 ENCOUNTER — Ambulatory Visit: Payer: Commercial Managed Care - HMO

## 2021-07-19 ENCOUNTER — Other Ambulatory Visit: Payer: Self-pay

## 2021-07-19 ENCOUNTER — Encounter: Payer: Self-pay | Admitting: Podiatry

## 2021-07-19 ENCOUNTER — Ambulatory Visit (INDEPENDENT_AMBULATORY_CARE_PROVIDER_SITE_OTHER): Payer: Medicare Other | Admitting: Podiatry

## 2021-07-19 DIAGNOSIS — M79675 Pain in left toe(s): Secondary | ICD-10-CM

## 2021-07-19 DIAGNOSIS — E118 Type 2 diabetes mellitus with unspecified complications: Secondary | ICD-10-CM

## 2021-07-19 DIAGNOSIS — B351 Tinea unguium: Secondary | ICD-10-CM | POA: Diagnosis not present

## 2021-07-19 DIAGNOSIS — M79674 Pain in right toe(s): Secondary | ICD-10-CM | POA: Diagnosis not present

## 2021-07-20 NOTE — Progress Notes (Signed)
°  Subjective:  Patient ID: [name], male    DOB: August 07, 1950,  MRN: [id number]    71 y.o. male returns for at risk diabetic foot care overall doing well.  No new wounds.  His nails are thickened and elongated, he often bleeds when he tries to cut them himself.  [name] still on Eliquis  Review of Systems: Negative except as noted in the HPI. Denies N/V/F/Ch.   Objective:  There were no vitals filed for this visit. There is no height or weight on file to calculate BMI. Constitutional Well developed. Well nourished.  Vascular Foot warm and well perfused. Capillary refill normal to all digits.   Neurologic Normal speech. Oriented to person, place, and time. Epicritic sensation to light touch grossly reduced bilaterally.  Dermatologic No recurrence of wound or ulceration.  He has thickened elongated toenails x10 bilaterally with subungual brown and yellow discoloration.  Left hallux nail dystrophic and has not grown much since last visit  Orthopedic: Tenderness to palpation noted about the surgical site.    Assessment:   1. Pain due to onychomycosis of toenails of both feet   2. DM (diabetes mellitus), type 2 with complications [facility])      Plan:  Patient was evaluated and treated and all questions answered.   Patient educated on diabetes. Discussed proper diabetic foot care and discussed risks and complications of disease. Educated patient in depth on reasons to return to the office immediately should he/she discover anything concerning or new on the feet. All questions answered. Discussed proper shoes as well.   Discussed the etiology and treatment options for the condition in detail with the patient.  Recommended debridement of the nails today. Sharp and mechanical debridement performed of all painful and mycotic nails today. Nails debrided in length and thickness using a nail nipper to level of comfort. Discussed treatment options including appropriate shoe gear. Follow up as  needed for painful nails.       Return in about 3 months (around 10/16/2021) for at risk diabetic foot care.

## 2021-07-24 ENCOUNTER — Other Ambulatory Visit: Payer: Self-pay | Admitting: Internal Medicine

## 2021-08-16 ENCOUNTER — Encounter: Payer: Self-pay | Admitting: Nurse Practitioner

## 2021-08-16 ENCOUNTER — Other Ambulatory Visit: Payer: Self-pay

## 2021-08-16 ENCOUNTER — Ambulatory Visit (INDEPENDENT_AMBULATORY_CARE_PROVIDER_SITE_OTHER): Payer: Medicare Other | Admitting: Nurse Practitioner

## 2021-08-16 VITALS — BP 120/70 | HR 82 | Ht 72.0 in | Wt 284.5 lb

## 2021-08-16 DIAGNOSIS — N183 Chronic kidney disease, stage 3 unspecified: Secondary | ICD-10-CM

## 2021-08-16 DIAGNOSIS — I2510 Atherosclerotic heart disease of native coronary artery without angina pectoris: Secondary | ICD-10-CM

## 2021-08-16 DIAGNOSIS — I480 Paroxysmal atrial fibrillation: Secondary | ICD-10-CM | POA: Diagnosis not present

## 2021-08-16 DIAGNOSIS — I255 Ischemic cardiomyopathy: Secondary | ICD-10-CM | POA: Diagnosis not present

## 2021-08-16 DIAGNOSIS — I5022 Chronic systolic (congestive) heart failure: Secondary | ICD-10-CM | POA: Diagnosis not present

## 2021-08-16 DIAGNOSIS — E785 Hyperlipidemia, unspecified: Secondary | ICD-10-CM

## 2021-08-16 DIAGNOSIS — I10 Essential (primary) hypertension: Secondary | ICD-10-CM

## 2021-08-16 MED ORDER — TORSEMIDE 40 MG PO TABS: 40.0000 mg | ORAL_TABLET | ORAL | 3 refills | Status: DC

## 2021-08-16 NOTE — Patient Instructions (Signed)
Medication Instructions:  ?No changes at this time. ? ?*If you need a refill on your cardiac medications before your next appointment, please call your pharmacy* ? ? ?Lab Work: ?None ? ?If you have labs (blood work) drawn today and your tests are completely normal, you will receive your results only by: ?MyChart Message (if you have MyChart) OR ?A paper copy in the mail ?If you have any lab test that is abnormal or we need to change your treatment, we will call you to review the results. ? ? ?Testing/Procedures: ?None ? ? ?Follow-Up: ?At [facility], you and your health needs are our priority.  As part of our continuing mission to provide you with exceptional heart care, we have created designated Provider Care Teams.  These Care Teams include your primary Cardiologist (physician) and Advanced Practice Providers (APPs -  Physician Assistants and Nurse Practitioners) who all work together to provide you with the care you need, when you need it. ? ? ?Your next appointment:   ?8 week(s) ? ?The format for your next appointment:   ?In Person ? ?Provider:   ?Nelva Bush, MD or Murray Hodgkins, NP ?

## 2021-08-16 NOTE — Progress Notes (Signed)
Office Visit    Patient Name: [name] Date of Encounter: 08/16/2021  Primary Care Provider:  Smith Robert, MD Primary Cardiologist:  Yvonne Kendall, MD  Chief Complaint    71 year old male with a history of chronic HFrEF, ischemic cardiomyopathy, CAD status post remote PCI to the proximal LAD, paroxysmal atrial fibrillation, stage III chronic kidney disease, left nephrectomy, hypertension, diabetes, sleep apnea, hyperlipidemia, and pulmonary hypertension, who presents for follow-up related to CHF.  Past Medical History    Past Medical History:  Diagnosis Date   CKD (chronic kidney disease) stage 3, GFR 30-59 ml/min (HCC)    Coronary artery disease    a. 1998 s/p ACS Multi-link BMS to the prox LAD ([place], [place]); b. 07/2015 Cath: LM nl, LAD patent stent, LCX min irregs, OM1/2 min irregs, OM3 nl, RCA min irregs; c. 07/2020 Cath: LM nl, LAD 50p/m, 70d, D2 70, LCX 40p/52m, OM3 40, RCA 60d->Med Rx.   Diabetes mellitus type 2, uncontrolled    a. A1c 11.0 in 07/2015.   HFrEF    a. EF 25% by cath in 2013; b. Echo in 07/2015 showing EF of 15-20%, moderate MR, moderate Pulm HTN, severely dilated IVC; c. 11/2016 Echo: EF 55-60%, no rwma, Gr1 DD, mildly dil LA, nl RV fxn. d. 12/2019 Echo: EF 40-45%, e. 07/2020 Echo: EF 20-25%, sev red RV fxx; f. 02/2021 Echo: EF 40-45%, glob HK/signif sept HK. Nl RV fxn.   History of kidney stones    Hypertension    Ischemic cardiomyopathy    Kidney stones    Left   Paroxysmal atrial fibrillation (HCC)    a. Dx 07/2015; b. CHA2DS2VASc = 5-->eliquis. Rhythm control w/ amiodarone.   Pollen allergies 09/21/2015   pt called and stated that he woke up and had some drainage-pt states he did not see the color of the drainage-pt denies running a fever and this only happened once-pt instructed to call Dr Assunta Gambles office if he starts running a fever or if the color of drainage becomes yellow/green   Psoriasis    Pulmonary hypertension (HCC)    Sleep  apnea    Past Surgical History:  Procedure Laterality Date   CARDIAC CATHETERIZATION N/A 07/25/2015   Procedure: Right/Left Heart Cath and Coronary Angiography;  Surgeon: Iran Ouch, MD;  Location: [facility];  Service: Cardiovascular;  Laterality: N/A;   CARDIAC [place]     [place]   CARDIAC CATHETERIZATION     [place], [place]   CORONARY ANGIOPLASTY WITH STENT PLACEMENT     CYSTOSCOPY WITH STENT PLACEMENT Left 09/07/2015   Procedure: CYSTOSCOPY WITH STENT PLACEMENT;  Surgeon: Vanna Scotland, MD;  Location: [facility];  Service: Urology;  Laterality: Left;   CYSTOSCOPY/URETEROSCOPY/HOLMIUM LASER/STENT PLACEMENT Left 10/25/2015   Procedure: CYSTOSCOPY/URETEROSCOPY/HOLMIUM LASER/STENT EXCHANGE;  Surgeon: Vanna Scotland, MD;  Location: [facility];  Service: Urology;  Laterality: Left;   CYSTOSCOPY/URETEROSCOPY/HOLMIUM LASER/STENT PLACEMENT Left 05/11/2019   Procedure: CYSTOSCOPY/URETEROSCOPY/HOLMIUM LASER/STENT PLACEMENT;  Surgeon: Vanna Scotland, MD;  Location: [facility];  Service: Urology;  Laterality: Left;   EXTRACORPOREAL SHOCK WAVE LITHOTRIPSY Left 04/02/2019   Procedure: EXTRACORPOREAL SHOCK WAVE LITHOTRIPSY (ESWL);  Surgeon: Vanna Scotland, MD;  Location: [facility];  Service: Urology;  Laterality: Left;   EXTRACORPOREAL SHOCK WAVE LITHOTRIPSY Left 03/05/2019   Procedure: EXTRACORPOREAL SHOCK WAVE LITHOTRIPSY (ESWL);  Surgeon: Riki Altes, MD;  Location: [facility];  Service: Urology;  Laterality: Left;   EYE SURGERY Bilateral    LASER FOR GLAUCOMA   IRRIGATION  AND DEBRIDEMENT FOOT Left 09/30/2020   Procedure: IRRIGATION AND DEBRIDEMENT FOOT;  Surgeon: Edwin Cap, DPM;  Location: [facility];  Service: Podiatry;  Laterality: Left;   KIDNEY SURGERY     LOWER EXTREMITY ANGIOGRAPHY Left 10/04/2020   Procedure: Lower Extremity Angiography;  Surgeon: Renford Dills, MD;  Location: [facility];  Service: Cardiovascular;  Laterality: Left;   NEPHROSTOMY  TUBE PLACEMENT ([facility]) Left 11/2019   RIGHT/LEFT HEART CATH AND CORONARY ANGIOGRAPHY N/A 07/12/2020   Procedure: RIGHT/LEFT HEART CATH AND CORONARY ANGIOGRAPHY;  Surgeon: Yvonne Kendall, MD;  Location: [facility];  Service: Cardiovascular;  Laterality: N/A;   ROBOT ASSISTED LAPAROSCOPIC NEPHRECTOMY Left 12/10/2019   Procedure: XI ROBOTIC ASSISTED LAPAROSCOPIC NEPHRECTOMY;  Surgeon: Vanna Scotland, MD;  Location: [facility];  Service: Urology;  Laterality: Left;   URETEROSCOPY WITH HOLMIUM LASER LITHOTRIPSY Left 09/07/2015   Procedure: URETEROSCOPY WITH HOLMIUM LASER LITHOTRIPSY;  Surgeon: Vanna Scotland, MD;  Location: [facility];  Service: Urology;  Laterality: Left;    Allergies  Allergies  Allergen Reactions   Other Hives and Other (See Comments)    Blue cheese Blue cheese Blue cheese    History of Present Illness    71 year old male with the above past medical history including chronic HFrEF, ischemic cardiomyopathy, CAD status post remote PCI to the proximal LAD, pulmonary hypertension, paroxysmal atrial fibrillation, stage III chronic kidney disease, left nephrectomy, hypertension, hyperlipidemia, diabetes, and sleep apnea.  Atrial fibrillation was diagnosed November 2017, and has been managed with amiodarone and Eliquis.  History of cardiomyopathy dates back to at least 2013, at which time EF was 25%.  In February 2022 he had worsening lower extremity edema despite titration of outpatient torsemide dosing.  Echocardiogram showed severe biventricular failure with an EF of 20 to 25% and severely reduced RV function.  Right and left heart cardiac catheterization showed moderate, nonobstructive CAD, similar to prior study in 2017, as well as significantly elevated right heart pressures.  He was admitted and subsequently discharged after 11 L net negative diuresis and subsequently placed on metolazone, which resulted in significant ongoing outpatient diuresis and elevation in creatinine  prompting de-escalation of diuretic therapy.  In May 2022, he was admitted with left lower extremity cellulitis and left foot hematoma requiring I&D, along with antibiotic therapy.  A follow-up echocardiogram in September 2022 showed slight improvement in EF to 40 to 45% with global hypokinesis and significant septal hypokinesis.  Imaging was challenging and RV function was reported as being normal.  Mr. Kilkenny was last seen in cardiology clinic in December 2022, at which time his weight had been trending up.  Metolazone was increased to 2.5 mg daily in addition to torsemide 40 mg daily.  Follow-up labs on March 7 showed stable creatinine at 2.08.  Over the past few months, he thinks he has gained some abdominal fat as his abdomen has been more bloated.  His weight on his home scales been trending around 279-280.  [name] 284.8 on our scale today.  He had previously been as low as 250 pounds in the spring 2022.  Despite weight gain, he has not had any change in his baseline level of dyspnea on exertion and denies lower extremity edema, PND, orthopnea, or early satiety.  He also denies chest pain, palpitations, dizziness, or syncope.  Home Medications    Current Outpatient Medications  Medication Sig Dispense Refill   amiodarone (PACERONE) 200 MG tablet Take 1 tablet (200 mg total) by mouth  daily. 30 tablet 0   apixaban (ELIQUIS) 5 MG TABS tablet Take 1 tablet (5 mg total) 2 (two) times daily by mouth. 180 tablet 2   Bromelains (BROMELAIN PO) Take 2 capsules by mouth daily.     carvedilol (COREG) 12.5 MG tablet TAKE (1) TABLET TWICE A DAY WITH FOOD---BREAKFAST AND SUPPER. 180 tablet 11   CINNAMON PO Take 1,200 mg by mouth in the morning and at bedtime. [place] Cinnamon 1200mg      clonazePAM (KLONOPIN) 1 MG tablet Take 1 mg by mouth See admin instructions. Take 1 tablet (1 mg) by mouth scheduled at bedtime, may take an additional dose during the day if needed for anxiety  4   Coenzyme Q10 (COQ10) 400 MG CAPS  Take 400 mg by mouth 2 (two) times daily.     docusate sodium (COLACE) 100 MG capsule Take 300 mg in the am & 200 mg in the pm.     Dulaglutide 4.5 MG/0.5ML SOPN Inject into the skin once a week.     glipiZIDE (GLUCOTROL) 10 MG tablet Take 10 mg by mouth 2 (two) times daily before a meal.     HAWTHORNE BERRY PO Take 565 mg by mouth at bedtime.     Insulin Glargine (TOUJEO SOLOSTAR [place]) Inject 84 Units into the skin as needed.     JARDIANCE 10 MG TABS tablet Take 10 mg by mouth every evening.   6   levothyroxine (SYNTHROID) 50 MCG tablet Take by mouth daily at 12 noon.     losartan (COZAAR) 25 MG tablet Take 0.5 tablets (12.5 mg total) by mouth daily. 30 tablet 6   magnesium oxide (MAG-OX) 400 (241.3 Mg) MG tablet Take 1 tablet (400 mg total) by mouth daily. 30 tablet 6   MELATONIN-THEANINE PO Take 2 capsules by mouth at bedtime. Olly Sleep Gummies     metolazone (ZAROXOLYN) 2.5 MG tablet TAKE 1 TABLET BY MOUTH ONCE DAILY IN THE MORNING. TAKE ALONG WITH YOUR MORNING DOSE OF TORSEMIDE. 30 tablet 0   Multiple Vitamins-Minerals (PRESERVISION AREDS 2 PO) Take 1 tablet by mouth in the morning and at bedtime.     potassium chloride SA (KLOR-CON) 20 MEQ tablet Take 2 tablets (40 mEq total) by mouth daily. 30 tablet 6   rosuvastatin (CRESTOR) 20 MG tablet Take 20 mg by mouth every evening.      valACYclovir (VALTREX) 1000 MG tablet Take 1,000 mg by mouth daily as needed.     vitamin C (ASCORBIC ACID) 500 MG tablet Take 500 mg by mouth at bedtime.     zinc gluconate 50 MG tablet Take 50 mg by mouth daily.     Torsemide 40 MG TABS Take 40 mg by mouth as directed. Take 40 mg once daily with extra 20 mg as needed for weight greater than 275 60 tablet 3   No current facility-administered medications for this visit.     Review of Systems    He denies chest pain, dyspnea, palpitations, PND, orthopnea, dizziness, syncope, edema, or early satiety.  All other systems reviewed and are otherwise negative except  as noted above.    Physical Exam    VS:  BP 120/70 (BP Location: Left Arm, Patient Position: Sitting, Cuff Size: Normal)   Pulse 82   Ht 6' (1.829 m)   Wt 284 lb 8 oz (129 kg)   SpO2 99%   BMI 38.59 kg/m  , BMI Body mass index is 38.59 kg/m.     GEN: Obese, in  no acute distress. HEENT: normal. Neck: Supple, obese, difficult to gauge JVP.  No carotid bruits or masses. Cardiac: RRR, no murmurs, rubs, or gallops. No clubbing, cyanosis, edema.  Radials/PT 2+ and equal bilaterally.  Respiratory:  Respirations regular and unlabored, clear to auscultation bilaterally. GI: Soft, nontender, nondistended, BS + x 4. MS: no deformity or atrophy. Skin: warm and dry, no rash. Neuro:  Strength and sensation are intact. Psych: Normal affect.  Accessory Clinical Findings    ECG personally reviewed by me today -regular sinus rhythm, first-degree AV block, left axis deviation, poor R wave progression- no acute changes.  Labs dated May 22, 2021  Hemoglobin 13.1, hematocrit 37.8, WBC 7.1, platelets 130 Sodium 140, potassium 3.5, chloride 97, CO2 29, BUN 48, creatinine 2.06 TSH 4.82, free T41.2  Labs dated August 08, 2021 from care everywhere  Hemoglobin 14.1 Sodium 136, potassium 3.4, chloride 93, CO2 33, BUN 38, creatinine 2.08, glucose 200 Calcium 9.6, phosphorus 3.5  Labs dated April 24, 2021 from care everywhere   Hemoglobin A1c 9.7  Assessment & Plan    1.  Chronic heart failure with midrange ejection fraction/ischemic cardiomyopathy: EF previously as low as 15 to 20% with subsequent improvement to 40 to 45% by most recent echo in September 2022.  In the setting of weight gain, his metolazone dose was increased to 2.5 mg daily in December 2022, on the background of torsemide 40 mg daily.  Despite this, his weight is up about 5 pounds since his last visit.  He has not had any significant change in symptoms and denies lower extremity edema, though he thinks his abdomen might be  more bloated.  Recent creatinine was 2.08 on March 7.  We agreed that he can try taking an additional torsemide 20 mg daily in the afternoon for weight above 275 pounds though I cautioned that if he does not see his significant response and ends up taking more frequent doses, he is to contact either [place] or his nephrologist as he will require lab work and likely adjustment of his diuretics further.  His heart rate and blood pressure are stable.  He remains on beta-blocker, ARB, and SGLT2 inhibitor.  2.  Coronary artery disease: Status post prior bare-metal stenting of the proximal LAD in 1998 with most recent catheterization in February 2022 showing moderate, nonobstructive CAD.  He has been feeling well without chest pain or dyspnea.  He remains on beta-blocker and statin therapy.  No aspirin in the setting of Eliquis.  3.  Essential hypertension: Stable.  4.  Hyperlipidemia: He remains on statin therapy, and lipids have been followed by primary care.  Goal LDL less than 70.  5.  Paroxysmal atrial fibrillation: Maintaining sinus rhythm on amiodarone with Eliquis for anticoagulation.  Stable LFTs and TFTs in December.  6.  Stage III chronic kidney disease/solitary kidney: Creatinine was stable at 2.08 earlier this month.  7.  Morbid obesity: BMI 38.5.  Encourage increase activity and caloric restriction with a goal of weight loss.  8.  Disposition: Follow-up in clinic in 8 weeks or sooner if necessary.   Nicolasa Ducking, NP 08/16/2021, 6:19 PM

## 2021-08-22 ENCOUNTER — Telehealth: Payer: Self-pay | Admitting: Nurse Practitioner

## 2021-08-22 NOTE — Telephone Encounter (Signed)
PCP office is calling to discuss D/C of Cozaar. She is not sure if patient should be taking or not. Please call to discuss.

## 2021-08-22 NOTE — Telephone Encounter (Signed)
Clarified with pt's PCP that pt was last seen in our office recently on 08/16/21 and no med changes made. We have pt taking Losartan 12.5 mg daily. PCP had noted documentation that med was discontinued then received a refill request and just needed to clarify that pt is to continue taking. No further needs at this time.  ?

## 2021-08-29 ENCOUNTER — Ambulatory Visit: Payer: Medicare Other | Admitting: Podiatry

## 2021-10-11 ENCOUNTER — Ambulatory Visit (INDEPENDENT_AMBULATORY_CARE_PROVIDER_SITE_OTHER): Payer: Medicare Other | Admitting: Nurse Practitioner

## 2021-10-11 ENCOUNTER — Encounter: Payer: Self-pay | Admitting: Nurse Practitioner

## 2021-10-11 ENCOUNTER — Other Ambulatory Visit
Admission: RE | Admit: 2021-10-11 | Discharge: 2021-10-11 | Disposition: A | Discharge status: Home / Self Care | Payer: Medicare Other | Attending: Nurse Practitioner | Admitting: Nurse Practitioner

## 2021-10-11 VITALS — BP 100/64 | HR 67 | Ht 72.0 in | Wt 280.5 lb

## 2021-10-11 DIAGNOSIS — I255 Ischemic cardiomyopathy: Secondary | ICD-10-CM

## 2021-10-11 DIAGNOSIS — I480 Paroxysmal atrial fibrillation: Secondary | ICD-10-CM

## 2021-10-11 DIAGNOSIS — Z79899 Other long term (current) drug therapy: Secondary | ICD-10-CM | POA: Insufficient documentation

## 2021-10-11 DIAGNOSIS — I2510 Atherosclerotic heart disease of native coronary artery without angina pectoris: Secondary | ICD-10-CM

## 2021-10-11 DIAGNOSIS — E785 Hyperlipidemia, unspecified: Secondary | ICD-10-CM

## 2021-10-11 DIAGNOSIS — N183 Chronic kidney disease, stage 3 unspecified: Secondary | ICD-10-CM | POA: Diagnosis not present

## 2021-10-11 DIAGNOSIS — Z794 Long term (current) use of insulin: Secondary | ICD-10-CM

## 2021-10-11 DIAGNOSIS — I10 Essential (primary) hypertension: Secondary | ICD-10-CM

## 2021-10-11 DIAGNOSIS — I5022 Chronic systolic (congestive) heart failure: Secondary | ICD-10-CM

## 2021-10-11 DIAGNOSIS — E1122 Type 2 diabetes mellitus with diabetic chronic kidney disease: Secondary | ICD-10-CM

## 2021-10-11 LAB — HEPATIC FUNCTION PANEL
ALT: 20 U/L (ref 0–44)
AST: 26 U/L (ref 15–41)
Albumin: 4.3 g/dL (ref 3.5–5.0)
Alkaline Phosphatase: 64 U/L (ref 38–126)
Bilirubin, Direct: <0.1 mg/dL (ref 0.0–0.2)
Total Bilirubin: 0.8 mg/dL (ref 0.3–1.2)
Total Protein: 7.8 g/dL (ref 6.5–8.1)

## 2021-10-11 LAB — TSH: TSH: 3.465 u[IU]/mL (ref 0.350–4.500)

## 2021-10-11 NOTE — Progress Notes (Signed)
? ? ?Office Visit  ?  ?Patient Name: [name] ?Date of Encounter: 10/11/2021 ? ?Primary Care Provider:  Vidal Schwalbe, MD ?Primary Cardiologist:  Nelva Bush, MD ? ?Chief Complaint  ?  ?71 year old male with history of chronic HFrEF, ischemic cardiomyopathy, CAD status post remote PCI to the proximal LAD, paroxysmal atrial fibrillation, stage III chronic kidney disease, left nephrectomy, hypertension, diabetes, sleep apnea, hyperlipidemia, and pulmonary hypertension, who presents for follow-up related to CHF. ? ?Past Medical History  ?  ?Past Medical History:  ?Diagnosis Date  ? CKD (chronic kidney disease) stage 3, GFR 30-59 ml/min (HCC)   ? Coronary artery disease   ? a. 1998 s/p ACS Multi-link BMS to the prox LAD ([place], [place]); b. 07/2015 Cath: LM nl, LAD patent stent, LCX min irregs, OM1/2 min irregs, OM3 nl, RCA min irregs; c. 07/2020 Cath: LM nl, LAD 50p/m, 70d, D2 70, LCX 40p/71m OM3 40, RCA 60d->Med Rx.  ? Diabetes mellitus type 2, uncontrolled   ? a. A1c 11.0 in 07/2015.  ? HFrEF   ? a. EF 25% by cath in 2013; b. Echo in 07/2015 showing EF of 15-20%, moderate MR, moderate Pulm HTN, severely dilated IVC; c. 11/2016 Echo: EF 55-60%, no rwma, Gr1 DD, mildly dil LA, nl RV fxn. d. 12/2019 Echo: EF 40-45%, e. 07/2020 Echo: EF 20-25%, sev red RV fxx; f. 02/2021 Echo: EF 40-45%, glob HK/signif sept HK. Nl RV fxn.  ? History of kidney stones   ? Hypertension   ? Ischemic cardiomyopathy   ? Kidney stones   ? Left  ? Paroxysmal atrial fibrillation (HCC)   ? a. Dx 07/2015; b. CHA2DS2VASc = 5-->eliquis. Rhythm control w/ amiodarone.  ? Pollen allergies 09/21/2015  ? pt called and stated that he woke up and had some drainage-pt states he did not see the color of the drainage-pt denies running a fever and this only happened once-pt instructed to call Dr BAudree Baneoffice if he starts running a fever or if the color of drainage becomes yellow/green  ? Psoriasis   ? Pulmonary hypertension ([place]   ? Sleep apnea    ? ?Past Surgical History:  ?Procedure Laterality Date  ? CARDIAC CATHETERIZATION N/A 07/25/2015  ? Procedure: Right/Left Heart Cath and Coronary Angiography;  Surgeon: MWellington Hampshire MD;  [facility];  Service: Cardiovascular;  Laterality: N/A;  ? CARDIAC CATHE[place]    ? [place]  ? [place] CORONARY ANGIOPLASTY WITH STENT PLACEMENT    ? CYSTOSCOPY WITH STENT PLACEMENT Left 09/07/2015  ? Procedure: CYSTOSCOPY WITH STENT PLACEMENT;  Surgeon: AHollice Espy MD;  Location: [facility];  Service: Urology;  Laterality: Left;  ? CYSTOSCOPY/URETEROSCOPY/HOLMIUM LASER/STENT PLACEMENT Left 10/25/2015  ? Procedure: CYSTOSCOPY/URETEROSCOPY/HOLMIUM LASER/STENT EXCHANGE;  Surgeon: AHollice Espy MD;  Location: [facility];  Service: Urology;  Laterality: Left;  ? CYSTOSCOPY/URETEROSCOPY/HOLMIUM LASER/STENT PLACEMENT Left 05/11/2019  ? Procedure: CYSTOSCOPY/URETEROSCOPY/HOLMIUM LASER/STENT PLACEMENT;  Surgeon: BHollice Espy MD;  Location: [facility];  Service: Urology;  Laterality: Left;  ? EXTRACORPOREAL SHOCK WAVE LITHOTRIPSY Left 04/02/2019  ? Procedure: EXTRACORPOREAL SHOCK WAVE LITHOTRIPSY (ESWL);  Surgeon: BHollice Espy MD;  Location: [facility];  Service: Urology;  Laterality: Left;  ? EXTRACORPOREAL SHOCK WAVE LITHOTRIPSY Left 03/05/2019  ? Procedure: EXTRACORPOREAL SHOCK WAVE LITHOTRIPSY (ESWL);  Surgeon: SAbbie Sons MD;  Location: [facility];  Service: Urology;  Laterality: Left;  ? EYE SURGERY Bilateral   ? LASER FOR GLAUCOMA  ? IRRIGATION AND  DEBRIDEMENT FOOT Left 09/30/2020  ? Procedure: IRRIGATION AND DEBRIDEMENT FOOT;  Surgeon: Criselda Peaches, DPM;  Location: [facility];  Service: Podiatry;  Laterality: Left;  ? KIDNEY SURGERY    ? LOWER EXTREMITY ANGIOGRAPHY Left 10/04/2020  ? Procedure: Lower Extremity Angiography;  Surgeon: Katha Cabal, MD;  Location: [place] [facility];  Service: Cardiovascular;  Laterality: Left;  ? NEPHROSTOMY TUBE  PLACEMENT ([place] HX) Left 11/2019  ? RIGHT/LEFT HEART CATH AND CORONARY ANGIOGRAPHY N/A 07/12/2020  ? Procedure: RIGHT/LEFT HEART CATH AND CORONARY ANGIOGRAPHY;  Surgeon: Nelva Bush, MD;  Location: [place] [facility];  Service: Cardiovascular;  Laterality: N/A;  ? ROBOT ASSISTED LAPAROSCOPIC NEPHRECTOMY Left 12/10/2019  ? Procedure: XI ROBOTIC ASSISTED LAPAROSCOPIC NEPHRECTOMY;  Surgeon: Hollice Espy, MD;  Location: [facility];  Service: Urology;  Laterality: Left;  ? URETEROSCOPY WITH HOLMIUM LASER LITHOTRIPSY Left 09/07/2015  ? Procedure: URETEROSCOPY WITH HOLMIUM LASER LITHOTRIPSY;  Surgeon: Hollice Espy, MD;  Location: [facility];  Service: Urology;  Laterality: Left;  ? ? ?Allergies ? ?Allergies  ?Allergen Reactions  ? Other Hives and Other (See Comments)  ?  Blue cheese ?Blue cheese ?Blue cheese  ? ? ?History of Present Illness  ?  ?71 year old male with above past medical history including chronic HFrEF, ischemic cardiomyopathy, CAD status post remote PCI to the proximal LAD, pulmonary hypertension, paroxysmal atrial fibrillation, stage III chronic kidney disease, left nephrectomy, hypertension, hyperlipidemia, diabetes, and sleep apnea.  Atrial fibrillation was diagnosed in November 2017, and has been managed with amiodarone and Eliquis.  History of cardiomyopathy dates back to at least 2013, at which time EF was 25%.  In February 2022, he had worsening lower extremity edema despite titration of outpatient torsemide dosing.  Echocardiogram showed severe biventricular failure with an EF of 20 to 25% and severely reduced RV function.  Right and left heart cardiac catheterization showed moderate, nonobstructive CAD, similar to prior study in 2017, as well as significantly elevated right heart pressures.  He was admitted and subsequently discharged after 11 L net negative diuresis, and subsequently placed on metolazone, which resulted in significant ongoing outpatient diuresis and elevation in creatinine  prompting de-escalation of diuretic therapy.  In May 2022, he was admitted with left lower extremity cellulitis and left foot hematoma requiring I&D, along with antibiotic therapy.  Echocardiogram in September 2022 showed slight improvement in EF to 40 to 45% with global hypokinesis and significant septal hypokinesis.  Imaging was challenging and RV function was reported as being normal. ? ?After clinic visit in December 2022, weight was trending up and metolazone was increased to 2.5 mg daily with addition of torsemide 40 mg daily.  Renal function remained stable.  At office follow-up on March 15, he reported weight gain, which she thought was largely related to abdominal fat, but was otherwise feeling well.  Over the past 45 days, [name] has remained relatively stable.  A few weeks ago, in the setting of mild weight gain, he started taking an additional 2.5 mg a few times a week to try and maintain his weight around 275 pounds.  He has not required this in about 2 weeks.  He has not been having any significant dyspnea and denies chest pain, palpitations, PND, orthopnea, dizziness, syncope, edema, or early satiety. ? ?Home Medications  ?  ?Current Outpatient Medications  ?Medication Sig Dispense Refill  ? amiodarone (PACERONE) 200 MG tablet Take 1 tablet (200 mg total) by mouth daily. 30 tablet 0  ? apixaban (ELIQUIS) 5  MG TABS tablet Take 1 tablet (5 mg total) 2 (two) times daily by mouth. 180 tablet 2  ? Bromelains (BROMELAIN PO) Take 2 capsules by mouth daily.    ? carvedilol (COREG) 12.5 MG tablet TAKE (1) TABLET TWICE A DAY WITH FOOD---BREAKFAST AND SUPPER. 180 tablet 11  ? CINNAMON PO Take 1,200 mg by mouth in the morning and at bedtime. [place] Cinnamon '1200mg'$     ? clonazePAM (KLONOPIN) 1 MG tablet Take 1 mg by mouth See admin instructions. Take 1 tablet (1 mg) by mouth scheduled at bedtime, may take an additional dose during the day if needed for anxiety  4  ? Coenzyme Q10 (COQ10) 400 MG CAPS Take 400 mg  by mouth 2 (two) times daily.    ? docusate sodium (COLACE) 100 MG capsule Take 300 mg in the am & 200 mg in the pm.    ? Dulaglutide 4.5 MG/0.5ML SOPN Inject into the skin once a week.    ? glipiZIDE (GLU

## 2021-10-11 NOTE — Patient Instructions (Signed)
Medication Instructions:  ?No changes at this time.  ? ?*If you need a refill on your cardiac medications before your next appointment, please call your pharmacy* ? ? ?Lab Work: ?TSH & LFT today over at the [facility] entrance and check in at registration.  ? ?If you have labs (blood work) drawn today and your tests are completely normal, you will receive your results only by: ?MyChart Message (if you have MyChart) OR ?A paper copy in the mail ?If you have any lab test that is abnormal or we need to change your treatment, we will call you to review the results. ? ? ?Testing/Procedures: ?None ? ? ?Follow-Up: ?At [facility], you and your health needs are our priority.  As part of our continuing mission to provide you with exceptional heart care, we have created designated Provider Care Teams.  These Care Teams include your primary Cardiologist (physician) and Advanced Practice Providers (APPs -  Physician Assistants and Nurse Practitioners) who all work together to provide you with the care you need, when you need it. ? ? ?Your next appointment:   ?3 month(s) ? ?The format for your next appointment:   ?In Person ? ?Provider:   ?Nelva Bush, MD or Murray Hodgkins, NP  ? ? ? ? ? ?Important Information About Sugar ? ? ? ? ?  ?

## 2021-10-16 ENCOUNTER — Ambulatory Visit (INDEPENDENT_AMBULATORY_CARE_PROVIDER_SITE_OTHER): Payer: Medicare Other | Admitting: Podiatry

## 2021-10-16 DIAGNOSIS — B351 Tinea unguium: Secondary | ICD-10-CM

## 2021-10-16 DIAGNOSIS — E118 Type 2 diabetes mellitus with unspecified complications: Secondary | ICD-10-CM | POA: Diagnosis not present

## 2021-10-16 DIAGNOSIS — M7661 Achilles tendinitis, right leg: Secondary | ICD-10-CM

## 2021-10-16 DIAGNOSIS — L84 Corns and callosities: Secondary | ICD-10-CM | POA: Diagnosis not present

## 2021-10-16 DIAGNOSIS — M79674 Pain in right toe(s): Secondary | ICD-10-CM

## 2021-10-16 DIAGNOSIS — M79675 Pain in left toe(s): Secondary | ICD-10-CM

## 2021-10-16 NOTE — Progress Notes (Signed)
?  Subjective:  ?Patient ID: [name], male    DOB: Sep 13, 1950,  MRN: [id number] ? ? ? ?71 y.o. male returns for at risk diabetic foot care overall doing well.  No new wounds.  His nails are thickened and elongated, he often bleeds when he tries to cut them himself.  [name] still on Eliquis.  There is thickening of the skin near the fifth toe now.  He recently had significant Achilles tendinitis on the right side, had scheduled appointment to see Dr. Posey Pronto but is started to improve so he canceled the appointment.  Still intermittently painful ? ?Review of Systems: Negative except as noted in the HPI. Denies N/V/F/Ch. ? ? ?Objective:  ?There were no vitals filed for this visit. ?There is no height or weight on file to calculate BMI. ?Constitutional Well developed. ?Well nourished.  ?Vascular Foot warm and well perfused. ?Capillary refill normal to all digits.   ?Neurologic Normal speech. ?Oriented to person, place, and time. ?Epicritic sensation to light touch grossly reduced bilaterally.  ?Dermatologic No recurrence of wound or ulceration.  He has thickened elongated toenails x10 bilaterally with subungual brown and yellow discoloration.  Left hallux nail dystrophic and has not grown much since last visit  ?Orthopedic: Tenderness to palpation at the his Achilles insertion on the right side, there is some edema here.  No discontinuity of the tendon.  Good 5 out of 5 strength.  ? ? ?Assessment:  ? ?1. Achilles tendinitis of right lower extremity   ?2. Pain due to onychomycosis of toenails of both feet   ?3. Callus of foot   ?4. DM (diabetes mellitus), type 2 with complications ([place])   ? ? ? ?Plan:  ?Patient was evaluated and treated and all questions answered. ? ? ?Patient educated on diabetes. Discussed proper diabetic foot care and discussed risks and complications of disease. Educated patient in depth on reasons to return to the office immediately should he/she discover anything concerning or new on the feet.  All questions answered. Discussed proper shoes as well.  ? ?Discussed the etiology and treatment options for the condition in detail with the patient.  Recommended debridement of the nails today. Sharp and mechanical debridement performed of all painful and mycotic nails today. Nails debrided in length and thickness using a nail nipper to level of comfort. Discussed treatment options including appropriate shoe gear. Follow up as needed for painful nails. ? ?All symptomatic hyperkeratoses were safely debrided with a sterile #15 blade to patient's level of comfort without incident. We discussed preventative and palliative care of these lesions including supportive and accommodative shoegear, padding, prefabricated and custom molded accommodative orthoses, use of a pumice stone and lotions/creams daily. ? ?Discussed the etiology and treatment options for Achilles tendinitis including stretching, formal physical therapy with an eccentric exercises therapy plan, supportive shoegears such as a running shoe or sneaker, heel lifts, topical and oral medications.  We are limited with oral medications due to his diabetes and anticoagulants.  We also discussed that I do not routinely perform injections in this area because of the risk of an increased damage or rupture of the tendon.  ? ?-XR reviewed with patient ?-Educated on stretching and icing of the affected limb. ?-Recommended Voltaren gel he also has a homemade capsaicin type cream that he has been using and this is helpful as well. ?  ? ? ?Return in about 3 months (around 01/16/2022) for at risk diabetic foot care.  ?

## 2021-10-16 NOTE — Patient Instructions (Addendum)
Look for Voltaren gel at the pharmacy over the counter or online (also known as diclofenac 1% gel). Apply to the painful areas 3-4x daily with the supplied dosing card. Allow to dry for 10 minutes before going into socks/shoes ? ? ? ?Achilles Tendinitis  ?with Rehab ?Achilles tendinitis is a disorder of the Achilles tendon. The Achilles tendon connects the large calf muscles (Gastrocnemius and Soleus) to the heel bone (calcaneus). This tendon is sometimes called the heel cord. It is important for pushing-off and standing on your toes and is important for walking, running, or jumping. Tendinitis is often caused by overuse and repetitive microtrauma. ?SYMPTOMS ?Pain, tenderness, swelling, warmth, and redness may occur over the Achilles tendon even at rest. ?Pain with pushing off, or flexing or extending the ankle. ?Pain that is worsened after or during activity. ?CAUSES  ?Overuse sometimes seen with rapid increase in exercise programs or in sports requiring running and jumping. ?Poor physical conditioning (strength and flexibility or endurance). ?Running sports, especially training running down hills. ?Inadequate warm-up before practice or play or failure to stretch before participation. ?Injury to the tendon. ?PREVENTION  ?Warm up and stretch before practice or competition. ?Allow time for adequate rest and recovery between practices and competition. ?Keep up conditioning. ?Keep up ankle and leg flexibility. ?Improve or keep muscle strength and endurance. ?Improve cardiovascular fitness. ?Use proper technique. ?Use proper equipment (shoes, skates). ?To help prevent recurrence, taping, protective strapping, or an adhesive bandage may be recommended for several weeks after healing is complete. ?PROGNOSIS  ?Recovery may take weeks to several months to heal. ?Longer recovery is expected if symptoms have been prolonged. ?Recovery is usually quicker if the inflammation is due to a direct blow as compared with overuse or  sudden strain. ?RELATED COMPLICATIONS  ?Healing time will be prolonged if the condition is not correctly treated. The injury must be given plenty of time to heal. ?Symptoms can reoccur if activity is resumed too soon. ?Untreated, tendinitis may increase the risk of tendon rupture requiring additional time for recovery and possibly surgery. ?TREATMENT  ?The first treatment consists of rest anti-inflammatory medication, and ice to relieve the pain. ?Stretching and strengthening exercises after resolution of pain will likely help reduce the risk of recurrence. Referral to a physical therapist or athletic trainer for further evaluation and treatment may be helpful. ?A walking boot or cast may be recommended to rest the Achilles tendon. This can help break the cycle of inflammation and microtrauma. ?Arch supports (orthotics) may be prescribed or recommended by your caregiver as an adjunct to therapy and rest. ?Surgery to remove the inflamed tendon lining or degenerated tendon tissue is rarely necessary and has shown less than predictable results. ?MEDICATION  ?Nonsteroidal anti-inflammatory medications, such as aspirin and ibuprofen, may be used for pain and inflammation relief. Do not take within 7 days before surgery. Take these as directed by your caregiver. Contact your caregiver immediately if any bleeding, stomach upset, or signs of allergic reaction occur. Other minor pain relievers, such as acetaminophen, may also be used. ?Pain relievers may be prescribed as necessary by your caregiver. Do not take prescription pain medication for longer than 4 to 7 days. Use only as directed and only as much as you need. ?Cortisone injections are rarely indicated. Cortisone injections may weaken tendons and predispose to rupture. It is better to give the condition more time to heal than to use them. ?HEAT AND COLD ?Cold is used to relieve pain and reduce inflammation for acute and chronic  Achilles tendinitis. Cold should be  applied for 10 to 15 minutes every 2 to 3 hours for inflammation and pain and immediately after any activity that aggravates your symptoms. Use ice packs or an ice massage. ?Heat may be used before performing stretching and strengthening activities prescribed by your caregiver. Use a heat pack or a warm soak. ?SEEK MEDICAL CARE IF: ?Symptoms get worse or do not improve in 2 weeks despite treatment. ?New, unexplained symptoms develop. Drugs used in treatment may produce side effects. ? ?EXERCISES: ? ?RANGE OF MOTION (ROM) AND STRETCHING EXERCISES - Achilles Tendinitis  ?These exercises may help you when beginning to rehabilitate your injury. Your symptoms may resolve with or without further involvement from your physician, physical therapist or athletic trainer. While completing these exercises, remember:  ?Restoring tissue flexibility helps normal motion to return to the joints. This allows healthier, less painful movement and activity. ?An effective stretch should be held for at least 30 seconds. ?A stretch should never be painful. You should only feel a gentle lengthening or release in the stretched tissue. ? ?STRETCH  Gastroc, Standing  ?Place hands on wall. ?Extend right / left leg, keeping the front knee somewhat bent. ?Slightly point your toes inward on your back foot. ?Keeping your right / left heel on the floor and your knee straight, shift your weight toward the wall, not allowing your back to arch. ?You should feel a gentle stretch in the right / left calf. Hold this position for 10 seconds. ?Repeat 3 times. Complete this stretch 2 times per day. ? ?STRETCH  Soleus, Standing  ?Place hands on wall. ?Extend right / left leg, keeping the other knee somewhat bent. ?Slightly point your toes inward on your back foot. ?Keep your right / left heel on the floor, bend your back knee, and slightly shift your weight over the back leg so that you feel a gentle stretch deep in your back calf. ?Hold this position for 10  seconds. ?Repeat 3 times. Complete this stretch 2 times per day. ? ?[employer], Standing  ?Note: This exercise can place a lot of stress on your foot and ankle. Please complete this exercise only if specifically instructed by your caregiver.  ?Place the ball of your right / left foot on a step, keeping your other foot firmly on the same step. ?Hold on to the wall or a rail for balance. ?Slowly lift your other foot, allowing your body weight to press your heel down over the edge of the step. ?You should feel a stretch in your right / left calf. ?Hold this position for 10 seconds. ?Repeat this exercise with a slight bend in your knee. ?Repeat 3 times. Complete this stretch 2 times per day.  ? ?STRENGTHENING EXERCISES - Achilles Tendinitis ?These exercises may help you when beginning to rehabilitate your injury. They may resolve your symptoms with or without further involvement from your physician, physical therapist or athletic trainer. While completing these exercises, remember:  ?Muscles can gain both the endurance and the strength needed for everyday activities through controlled exercises. ?Complete these exercises as instructed by your physician, physical therapist or athletic trainer. Progress the resistance and repetitions only as guided. ?You may experience muscle soreness or fatigue, but the pain or discomfort you are trying to eliminate should never worsen during these exercises. If this pain does worsen, stop and make certain you are following the directions exactly. If the pain is still present after adjustments, discontinue the exercise until you can discuss  the trouble with your clinician. ? ?STRENGTH - Plantar-flexors  ?Sit with your right / left leg extended. Holding onto both ends of a rubber exercise band/tubing, loop it around the ball of your foot. Keep a slight tension in the band. ?Slowly push your toes away from you, pointing them downward. ?Hold this position for 10 seconds. Return  slowly, controlling the tension in the band/tubing. ?Repeat 3 times. Complete this exercise 2 times per day.  ? ?STRENGTH - Plantar-flexors  ?Stand with your feet shoulder width apart. Steady yourself with

## 2021-10-17 ENCOUNTER — Other Ambulatory Visit: Payer: Self-pay | Admitting: Internal Medicine

## 2022-01-08 ENCOUNTER — Encounter (INDEPENDENT_AMBULATORY_CARE_PROVIDER_SITE_OTHER): Payer: Self-pay | Admitting: Vascular Surgery

## 2022-01-08 ENCOUNTER — Ambulatory Visit (INDEPENDENT_AMBULATORY_CARE_PROVIDER_SITE_OTHER): Payer: Medicare Other | Admitting: Vascular Surgery

## 2022-01-08 VITALS — BP 97/59 | HR 67 | Resp 16 | Wt 274.8 lb

## 2022-01-08 DIAGNOSIS — I480 Paroxysmal atrial fibrillation: Secondary | ICD-10-CM

## 2022-01-08 DIAGNOSIS — I890 Lymphedema, not elsewhere classified: Secondary | ICD-10-CM | POA: Diagnosis not present

## 2022-01-08 DIAGNOSIS — I872 Venous insufficiency (chronic) (peripheral): Secondary | ICD-10-CM

## 2022-01-08 DIAGNOSIS — I10 Essential (primary) hypertension: Secondary | ICD-10-CM

## 2022-01-08 DIAGNOSIS — I2510 Atherosclerotic heart disease of native coronary artery without angina pectoris: Secondary | ICD-10-CM | POA: Diagnosis not present

## 2022-01-10 ENCOUNTER — Encounter (INDEPENDENT_AMBULATORY_CARE_PROVIDER_SITE_OTHER): Payer: Self-pay | Admitting: Vascular Surgery

## 2022-01-10 DIAGNOSIS — I890 Lymphedema, not elsewhere classified: Secondary | ICD-10-CM | POA: Insufficient documentation

## 2022-01-10 NOTE — Progress Notes (Signed)
MRN : [id number]  [name] is a 71 y.o. ([birth year]) male who presents with chief complaint of legs swell.  History of Present Illness:    The patient returns to the office for followup evaluation regarding leg swelling.  The swelling has persisted but with the lymph pump the patient states the swelling is much better controlled. The pain associated with swelling is essentially eliminated. There have not been any interval development of a ulcerations or wounds.  No episodes of cellulitis or infection over the past 12 months  The patient denies problems with the pump, noting it is working well and the leggings are in good condition.  Since the previous visit the patient has been wearing graduated compression stockings and using the lymph pump on a routine basis and  has noted significant improvement in the lymphedema.   Patient stated the lymph pump has been helpful in treating the lymphedema.    Current Meds  Medication Sig   amiodarone (PACERONE) 200 MG tablet Take 1 tablet (200 mg total) by mouth daily.   apixaban (ELIQUIS) 5 MG TABS tablet Take 1 tablet (5 mg total) 2 (two) times daily by mouth.   Bromelains (BROMELAIN PO) Take 2 capsules by mouth daily.   carvedilol (COREG) 12.5 MG tablet TAKE (1) TABLET TWICE A DAY WITH FOOD---BREAKFAST AND SUPPER.   CINNAMON PO Take 1,200 mg by mouth in the morning and at bedtime. [place] Cinnamon '1200mg'$    clonazePAM (KLONOPIN) 1 MG tablet Take 1 mg by mouth See admin instructions. Take 1 tablet (1 mg) by mouth scheduled at bedtime, may take an additional dose during the day if needed for anxiety   Coenzyme Q10 (COQ10) 400 MG CAPS Take 400 mg by mouth 2 (two) times daily.   docusate sodium (COLACE) 100 MG capsule Take 300 mg in the am & 200 mg in the pm.   Dulaglutide 4.5 MG/0.5ML SOPN Inject into the skin once a week.   glipiZIDE (GLUCOTROL) 10 MG tablet Take 10 mg by mouth 2 (two) times daily before a meal.   HAWTHORNE BERRY PO Take  565 mg by mouth at bedtime.   Insulin Glargine (TOUJEO SOLOSTAR [place]) Inject 100 Units into the skin as needed.   JARDIANCE 10 MG TABS tablet Take 10 mg by mouth every evening.    losartan (COZAAR) 25 MG tablet Take 0.5 tablets (12.5 mg total) by mouth daily.   magnesium oxide (MAG-OX) 400 (241.3 Mg) MG tablet Take 1 tablet (400 mg total) by mouth daily.   MELATONIN-THEANINE PO Take 2 capsules by mouth at bedtime. Olly Sleep Gummies   metolazone (ZAROXOLYN) 2.5 MG tablet TAKE 1 TABLET BY MOUTH ONCE DAILY IN THE MORNING. TAKE ALONG WITH YOUR MORNING DOSE OF TORSEMIDE.   Multiple Vitamins-Minerals (PRESERVISION AREDS 2 PO) Take 1 tablet by mouth in the morning and at bedtime.   potassium chloride SA (KLOR-CON) 20 MEQ tablet Take 2 tablets (40 mEq total) by mouth daily.   rosuvastatin (CRESTOR) 20 MG tablet Take 20 mg by mouth every evening.    tirzepatide [employer]) 12.5 MG/0.5ML Pen Inject into the skin once a week.   Torsemide 40 MG TABS Take 40 mg by mouth as directed. Take 40 mg once daily with extra 20 mg as needed for weight greater than 275   vitamin C (ASCORBIC ACID) 500 MG tablet Take 500 mg by mouth at bedtime.   zinc gluconate 50 MG tablet Take 50 mg by mouth daily.  Past Medical History:  Diagnosis Date   CKD (chronic kidney disease) stage 3, GFR 30-59 ml/min (HCC)    Coronary artery disease    a. 1998 s/p ACS Multi-link BMS to the prox LAD ([place]); b. 07/2015 Cath: LM nl, LAD patent stent, LCX min irregs, OM1/2 min irregs, OM3 nl, RCA min irregs; c. 07/2020 Cath: LM nl, LAD 50p/m, 70d, D2 70, LCX 40p/72m OM3 40, RCA 60d->Med Rx.   Diabetes mellitus type 2, uncontrolled    a. A1c 11.0 in 07/2015.   HFrEF    a. EF 25% by cath in 2013; b. Echo in 07/2015 showing EF of 15-20%, moderate MR, moderate Pulm HTN, severely dilated IVC; c. 11/2016 Echo: EF 55-60%, no rwma, Gr1 DD, mildly dil LA, nl RV fxn. d. 12/2019 Echo: EF 40-45%, e. 07/2020 Echo: EF 20-25%, sev red RV fxx; f.  02/2021 Echo: EF 40-45%, glob HK/signif sept HK. Nl RV fxn.   History of kidney stones    Hypertension    Ischemic cardiomyopathy    Kidney stones    Left   Paroxysmal atrial fibrillation ([place]    a. Dx 07/2015; b. CHA2DS2VASc = 5-->eliquis. Rhythm control w/ amiodarone.   Pollen allergies 09/21/2015   pt called and stated that he woke up and had some drainage-pt states he did not see the color of the drainage-pt denies running a fever and this only happened once-pt instructed to call Dr BAudree Baneoffice if he starts running a fever or if the color of drainage becomes yellow/green   Psoriasis    Pulmonary hypertension ([facility]    Sleep apnea     Past Surgical History:  Procedure Laterality Date   CARDIAC CATHETERIZATION N/A 07/25/2015   Procedure: Right/Left Heart Cath and Coronary Angiography;  Surgeon: MWellington Hampshire MD;  [facility];  Service: Cardiovascular;  Laterality: N/A;   CARDIAC CATHETERIZATION     [place]  CORONARY ANGIOPLASTY WITH STENT PLACEMENT     CYSTOSCOPY WITH STENT PLACEMENT Left 09/07/2015   Procedure: CYSTOSCOPY WITH STENT PLACEMENT;  Surgeon: AHollice Espy MD;  Location: [facility];  Service: Urology;  Laterality: Left;   CYSTOSCOPY/URETEROSCOPY/HOLMIUM LASER/STENT PLACEMENT Left 10/25/2015   Procedure: CYSTOSCOPY/URETEROSCOPY/HOLMIUM LASER/STENT EXCHANGE;  Surgeon: AHollice Espy MD;  Location: [facility];  Service: Urology;  Laterality: Left;   CYSTOSCOPY/URETEROSCOPY/HOLMIUM LASER/STENT PLACEMENT Left 05/11/2019   Procedure: CYSTOSCOPY/URETEROSCOPY/HOLMIUM LASER/STENT PLACEMENT;  Surgeon: BHollice Espy MD;  Location: [facility];  Service: Urology;  Laterality: Left;   EXTRACORPOREAL SHOCK WAVE LITHOTRIPSY Left 04/02/2019   Procedure: EXTRACORPOREAL SHOCK WAVE LITHOTRIPSY (ESWL);  Surgeon: BHollice Espy MD;  Location: [facility];  Service: Urology;  Laterality: Left;   EXTRACORPOREAL SHOCK  WAVE LITHOTRIPSY Left 03/05/2019   Procedure: EXTRACORPOREAL SHOCK WAVE LITHOTRIPSY (ESWL);  Surgeon: SAbbie Sons MD;  Location: [facility];  Service: Urology;  Laterality: Left;   EYE SURGERY Bilateral    LASER FOR GLAUCOMA   IRRIGATION AND DEBRIDEMENT FOOT Left 09/30/2020   Procedure: IRRIGATION AND DEBRIDEMENT FOOT;  Surgeon: MCriselda Peaches DPM;  Location: [facility];  Service: Podiatry;  Laterality: Left;   KIDNEY SURGERY     LOWER EXTREMITY ANGIOGRAPHY Left 10/04/2020   Procedure: Lower Extremity Angiography;  Surgeon: SKatha Cabal MD;  [facility];  Service: Cardiovascular;  Laterality: Left;   NEPHROSTOMY TUBE PLACEMENT ([place]) Left 11/2019   RIGHT/LEFT HEART CATH AND CORONARY ANGIOGRAPHY N/A 07/12/2020  Procedure: RIGHT/LEFT HEART CATH AND CORONARY ANGIOGRAPHY;  Surgeon: Nelva Bush, MD;  Location[facility];  Service: Cardiovascular;  Laterality: N/A;   ROBOT ASSISTED LAPAROSCOPIC NEPHRECTOMY Left 12/10/2019   Procedure: XI ROBOTIC ASSISTED LAPAROSCOPIC NEPHRECTOMY;  Surgeon: Hollice Espy, MD;  Location: [facility];  Service: Urology;  Laterality: Left;   URETEROSCOPY WITH HOLMIUM LASER LITHOTRIPSY Left 09/07/2015   Procedure: URETEROSCOPY WITH HOLMIUM LASER LITHOTRIPSY;  Surgeon: Hollice Espy, MD;  Location: [facility];  Service: Urology;  Laterality: Left;    Social History Social History   Tobacco Use   Smoking status: Never   Smokeless tobacco: Never  Vaping Use   Vaping Use: Never used  Substance Use Topics   Alcohol use: Yes    Alcohol/week: 0.0 standard drinks of alcohol    Comment: occasionally   Drug use: No    Types: Marijuana, Cocaine, Opium, LSD    Comment: Past, greater than 10 years ago    Family History Family History  Problem Relation Age of Onset   Heart failure Father    Heart attack Brother    Kidney cancer Neg Hx    Bladder Cancer Neg Hx    Prostate cancer Neg Hx     Allergies  Allergen Reactions    Other Hives and Other (See Comments)    Blue cheese Blue cheese Blue cheese     REVIEW OF SYSTEMS (Negative unless checked)  Constitutional: '[]'$ Weight loss  '[]'$ Fever  '[]'$ Chills Cardiac: '[]'$ Chest pain   '[]'$ Chest pressure   '[]'$ Palpitations   '[]'$ Shortness of breath when laying flat   '[]'$ Shortness of breath with exertion. Vascular:  '[]'$ Pain in legs with walking   '[x]'$ Pain in legs with standing  '[]'$ History of DVT   '[]'$ Phlebitis   '[x]'$ Swelling in legs   '[]'$ Varicose veins   '[]'$ Non-healing ulcers Pulmonary:   '[]'$ Uses home oxygen   '[]'$ Productive cough   '[]'$ Hemoptysis   '[]'$ Wheeze  '[]'$ COPD   '[]'$ Asthma Neurologic:  '[]'$ Dizziness   '[]'$ Seizures   '[]'$ History of stroke   '[]'$ History of TIA  '[]'$ Aphasia   '[]'$ Vissual changes   '[]'$ Weakness or numbness in arm   '[]'$ Weakness or numbness in leg Musculoskeletal:   '[]'$ Joint swelling   '[]'$ Joint pain   '[]'$ Low back pain Hematologic:  '[]'$ Easy bruising  '[]'$ Easy bleeding   '[]'$ Hypercoagulable state   '[]'$ Anemic Gastrointestinal:  '[]'$ Diarrhea   '[]'$ Vomiting  '[]'$ Gastroesophageal reflux/heartburn   '[]'$ Difficulty swallowing. Genitourinary:  '[]'$ Chronic kidney disease   '[]'$ Difficult urination  '[]'$ Frequent urination   '[]'$ Blood in urine Skin:  '[]'$ Rashes   '[]'$ Ulcers  Psychological:  '[]'$ History of anxiety   '[]'$  History of major depression.  Physical Examination  Vitals:   01/08/22 1334  BP: (!) 97/59  Pulse: 67  Resp: 16  Weight: 274 lb 12.8 oz (124.6 kg)   Body mass index is 37.27 kg/m. Gen: WD/WN, NAD Head: [place]/AT, No temporalis wasting.  Ear/Nose/Throat: Hearing grossly intact, nares w/o erythema or drainage, pinna without lesions Eyes: PER, EOMI, sclera nonicteric.  Neck: Supple, no gross masses.  No JVD.  Pulmonary:  Good air movement, no audible wheezing, no use of accessory muscles.  Cardiac: RRR, precordium not hyperdynamic. Vascular:  Severe venous stasis changes to the legs bilaterally.  2+ soft pitting edema  Vessel Right Left  Radial Palpable Palpable  Gastrointestinal: soft, non-distended. No guarding/no  peritoneal signs.  Musculoskeletal: M/S 5/5 throughout.  No deformity.  Neurologic: CN 2-12 intact. Pain and light touch intact in extremities.  Symmetrical.  Speech is fluent. Motor exam as listed above. Psychiatric: Judgment  intact, Mood & affect appropriate for pt's clinical situation. Dermatologic: Venous rashes no ulcers noted.  No changes consistent with cellulitis. Lymph : No lichenification or skin changes of chronic lymphedema.  CBC Lab Results  Component Value Date   WBC 5.7 10/04/2020   HGB 10.8 (L) 10/04/2020   HCT 32.6 (L) 10/04/2020   MCV 95.9 10/04/2020   PLT 166 10/04/2020    BMET    Component Value Date/Time   NA 137 01/11/2021 1531   K 3.9 01/11/2021 1531   CL 85 (L) 01/11/2021 1531   CO2 34 (H) 01/11/2021 1531   GLUCOSE 496 (H) 01/11/2021 1531   GLUCOSE 229 (H) 10/04/2020 0442   BUN 66 (H) 01/11/2021 1531   CREATININE 2.45 (H) 01/11/2021 1531   CALCIUM 9.6 01/11/2021 1531   GFRNONAA 29 (L) 10/04/2020 0442   GFRAA 36 (L) 07/08/2020 1525   CrCl cannot be calculated (Patient's most recent lab result is older than the maximum 21 days allowed.).  COAG Lab Results  Component Value Date   INR 1.1 12/08/2019   INR 1.5 (H) 04/10/2019   INR 1.18 09/07/2015    Radiology No results found.   Assessment/Plan 1. Lymphedema Recommend:  No surgery or intervention at this point in time.    I have reviewed my discussion with the patient regarding lymphedema and why it  causes symptoms.  Patient will continue wearing graduated compression on a daily basis. The patient should put the compression on first thing in the morning and removing them in the evening. The patient should not sleep in the compression.   In addition, behavioral modification throughout the day will be continued.  This will include frequent elevation (such as in a recliner), use of over the counter pain medications as needed and exercise such as walking.  The systemic causes for chronic edema  such as liver, kidney and cardiac etiologies does not appear to have significant changed over the past year.    The patient will continue aggressive use of the  lymph pump.  This will continue to improve the edema control and prevent sequela such as ulcers and infections.   The patient will follow-up with me on a PRNl basis.    2. Chronic venous insufficiency Recommend:  No surgery or intervention at this point in time.    I have reviewed my discussion with the patient regarding lymphedema and why it  causes symptoms.  Patient will continue wearing graduated compression on a daily basis. The patient should put the compression on first thing in the morning and removing them in the evening. The patient should not sleep in the compression.   In addition, behavioral modification throughout the day will be continued.  This will include frequent elevation (such as in a recliner), use of over the counter pain medications as needed and exercise such as walking.  The systemic causes for chronic edema such as liver, kidney and cardiac etiologies does not appear to have significant changed over the past year.    The patient will continue aggressive use of the  lymph pump.  This will continue to improve the edema control and prevent sequela such as ulcers and infections.   The patient will follow-up with me on a PRN basis.    3. Coronary artery disease involving native coronary artery of native heart without angina pectoris Continue cardiac and antihypertensive medications as already ordered and reviewed, no changes at this time.  Continue statin as ordered and reviewed, no changes at this  time  Nitrates PRN for chest pain   4. Paroxysmal atrial fibrillation (HCC) Continue antiarrhythmia medications as already ordered, these medications have been reviewed and there are no changes at this time.  Continue anticoagulation as ordered by Cardiology Service   5. Primary hypertension Continue  antihypertensive medications as already ordered, these medications have been reviewed and there are no changes at this time.     Hortencia Pilar, MD  01/10/2022 9:46 AM

## 2022-01-17 ENCOUNTER — Ambulatory Visit (INDEPENDENT_AMBULATORY_CARE_PROVIDER_SITE_OTHER): Payer: Medicare Other | Admitting: Podiatry

## 2022-01-17 DIAGNOSIS — B351 Tinea unguium: Secondary | ICD-10-CM

## 2022-01-17 DIAGNOSIS — M79674 Pain in right toe(s): Secondary | ICD-10-CM

## 2022-01-17 DIAGNOSIS — M79675 Pain in left toe(s): Secondary | ICD-10-CM

## 2022-01-18 ENCOUNTER — Encounter: Payer: Self-pay | Admitting: Nurse Practitioner

## 2022-01-18 ENCOUNTER — Ambulatory Visit (INDEPENDENT_AMBULATORY_CARE_PROVIDER_SITE_OTHER): Payer: Medicare Other | Admitting: Nurse Practitioner

## 2022-01-18 VITALS — BP 100/58 | HR 72 | Ht 72.0 in | Wt 275.2 lb

## 2022-01-18 DIAGNOSIS — I10 Essential (primary) hypertension: Secondary | ICD-10-CM

## 2022-01-18 DIAGNOSIS — I5022 Chronic systolic (congestive) heart failure: Secondary | ICD-10-CM | POA: Diagnosis not present

## 2022-01-18 DIAGNOSIS — I480 Paroxysmal atrial fibrillation: Secondary | ICD-10-CM

## 2022-01-18 DIAGNOSIS — I2510 Atherosclerotic heart disease of native coronary artery without angina pectoris: Secondary | ICD-10-CM

## 2022-01-18 DIAGNOSIS — N183 Chronic kidney disease, stage 3 unspecified: Secondary | ICD-10-CM

## 2022-01-18 DIAGNOSIS — E785 Hyperlipidemia, unspecified: Secondary | ICD-10-CM

## 2022-01-18 NOTE — Patient Instructions (Signed)
Medication Instructions:  Your physician recommends that you continue on your current medications as directed. Please refer to the Current Medication list given to you today.  *If you need a refill on your cardiac medications before your next appointment, please call your pharmacy*   Lab Work: None  If you have labs (blood work) drawn today and your tests are completely normal, you will receive your results only by: [place] (if you have MyChart) OR A paper copy in the mail If you have any lab test that is abnormal or we need to change your treatment, we will call you to review the results.   Testing/Procedures: None   Follow-Up: At [facility], you and your health needs are our priority.  As part of our continuing mission to provide you with exceptional heart care, we have created designated Provider Care Teams.  These Care Teams include your primary Cardiologist (physician) and Advanced Practice Providers (APPs -  Physician Assistants and Nurse Practitioners) who all work together to provide you with the care you need, when you need it.   Your next appointment:   3 month(s)  The format for your next appointment:   In Person  Provider:   Nelva Bush, MD or Murray Hodgkins, NP       Important Information About Sugar

## 2022-01-18 NOTE — Progress Notes (Signed)
Office Visit    Patient Name: [name] Date of Encounter: 01/18/2022  Primary Care Provider:  Vidal Schwalbe, MD Primary Cardiologist:  Nelva Bush, MD  Chief Complaint    71 y/o ? w/ a h/o chronic HFrEF, ICM, CAD status post remote PCI to the proximal LAD, paroxysmal atrial fibrillation, stage III chronic kidney disease, left nephrectomy, hypertension, diabetes, sleep apnea, hyperlipidemia, and pulmonary hypertension, who presents for follow-up related to CHF.  Past Medical History    Past Medical History:  Diagnosis Date   CKD (chronic kidney disease) stage 3, GFR 30-59 ml/min (HCC)    Coronary artery disease    a. 1998 s/p ACS Multi-link BMS to the prox LAD ([place], [place]); b. 07/2015 Cath: LM nl, LAD patent stent, LCX min irregs, OM1/2 min irregs, OM3 nl, RCA min irregs; c. 07/2020 Cath: LM nl, LAD 50p/m, 70d, D2 70, LCX 40p/53m OM3 40, RCA 60d->Med Rx.   Diabetes mellitus type 2, uncontrolled    a. A1c 11.0 in 07/2015.   HFrEF    a. EF 25% by cath in 2013; b. Echo in 07/2015 showing EF of 15-20%, moderate MR, moderate Pulm HTN, severely dilated IVC; c. 11/2016 Echo: EF 55-60%, no rwma, Gr1 DD, mildly dil LA, nl RV fxn. d. 12/2019 Echo: EF 40-45%, e. 07/2020 Echo: EF 20-25%, sev red RV fxx; f. 02/2021 Echo: EF 40-45%, glob HK/signif sept HK. Nl RV fxn.   History of kidney stones    Hypertension    Ischemic cardiomyopathy    Kidney stones    Left   Paroxysmal atrial fibrillation ([place]    a. Dx 07/2015; b. CHA2DS2VASc = 5-->eliquis. Rhythm control w/ amiodarone.   Pollen allergies 09/21/2015   pt called and stated that he woke up and had some drainage-pt states he did not see the color of the drainage-pt denies running a fever and this only happened once-pt instructed to call Dr BAudree Baneoffice if he starts running a fever or if the color of drainage becomes yellow/green   Psoriasis    Pulmonary hypertension ([place]    Sleep apnea    Past Surgical History:   Procedure Laterality Date   CARDIAC CATHETERIZATION N/A 07/25/2015   Procedure: Right/Left Heart Cath and Coronary Angiography;  Surgeon: MWellington Hampshire MD;  [facility];  Service: Cardiovascular;  Laterality: N/A;   CARDIAC CATHETERIZATION     [place]  CORONARY ANGIOPLASTY WITH STENT PLACEMENT     CYSTOSCOPY WITH STENT PLACEMENT Left 09/07/2015   Procedure: CYSTOSCOPY WITH STENT PLACEMENT;  Surgeon: AHollice Espy MD;  Location: [facility];  Service: Urology;  Laterality: Left;   CYSTOSCOPY/URETEROSCOPY/HOLMIUM LASER/STENT PLACEMENT Left 10/25/2015   Procedure: CYSTOSCOPY/URETEROSCOPY/HOLMIUM LASER/STENT EXCHANGE;  Surgeon: AHollice Espy MD;  Location: [facility];  Service: Urology;  Laterality: Left;   CYSTOSCOPY/URETEROSCOPY/HOLMIUM LASER/STENT PLACEMENT Left 05/11/2019   Procedure: CYSTOSCOPY/URETEROSCOPY/HOLMIUM LASER/STENT PLACEMENT;  Surgeon: BHollice Espy MD;  Location: [facility];  Service: Urology;  Laterality: Left;   EXTRACORPOREAL SHOCK WAVE LITHOTRIPSY Left 04/02/2019   Procedure: EXTRACORPOREAL SHOCK WAVE LITHOTRIPSY (ESWL);  Surgeon: BHollice Espy MD;  Location: [facility];  Service: Urology;  Laterality: Left;   EXTRACORPOREAL SHOCK WAVE LITHOTRIPSY Left 03/05/2019   Procedure: EXTRACORPOREAL SHOCK WAVE LITHOTRIPSY (ESWL);  Surgeon: SAbbie Sons MD;  Location: [facility];  Service: Urology;  Laterality: Left;   EYE SURGERY Bilateral    LASER FOR GLAUCOMA   IRRIGATION AND  DEBRIDEMENT FOOT Left 09/30/2020   Procedure: IRRIGATION AND DEBRIDEMENT FOOT;  Surgeon: Criselda Peaches, DPM;  Location: [facility];  Service: Podiatry;  Laterality: Left;   KIDNEY SURGERY     LOWER EXTREMITY ANGIOGRAPHY Left 10/04/2020   Procedure: Lower Extremity Angiography;  Surgeon: Katha Cabal, MD;  Location: [facility];  Service: Cardiovascular;  Laterality: Left;   NEPHROSTOMY TUBE PLACEMENT ([place] [facility]) Left  11/2019   RIGHT/LEFT HEART CATH AND CORONARY ANGIOGRAPHY N/A 07/12/2020   Procedure: RIGHT/LEFT HEART CATH AND CORONARY ANGIOGRAPHY;  Surgeon: Nelva Bush, MD;  Location: [facility];  Service: Cardiovascular;  Laterality: N/A;   ROBOT ASSISTED LAPAROSCOPIC NEPHRECTOMY Left 12/10/2019   Procedure: XI ROBOTIC ASSISTED LAPAROSCOPIC NEPHRECTOMY;  Surgeon: Hollice Espy, MD;  Location: [facility];  Service: Urology;  Laterality: Left;   URETEROSCOPY WITH HOLMIUM LASER LITHOTRIPSY Left 09/07/2015   Procedure: URETEROSCOPY WITH HOLMIUM LASER LITHOTRIPSY;  Surgeon: Hollice Espy, MD;  Location: [facility];  Service: Urology;  Laterality: Left;    Allergies  Allergies  Allergen Reactions   Other Hives and Other (See Comments)    Blue cheese Blue cheese Blue cheese    History of Present Illness    71 year old male with the above past medical history including chronic HFrEF, ischemic cardiomyopathy, CAD status post remote PCI to the proximal LAD, pulmonary hypertension, paroxysmal atrial fibrillation, stage III chronic kidney disease, left nephrectomy, hypertension, hyperlipidemia, diabetes, and sleep apnea.  Atrial fibrillation was diagnosed in November 2017, and he has been managed with amiodarone and Eliquis.  History of cardiomyopathy dates back to at least 2013, at which time EF was 25%.  In February 2022, he had worsening lower extremity edema despite titration of outpatient torsemide dosing.  Echocardiogram showed severe biventricular failure with an EF of 20 to 25% and severely reduced RV function.  Right and left heart cardiac catheterization showed moderate nonobstructive CAD, similar to prior study in 2017, as well as significantly elevated right heart pressures.  He was admitted and subsequently discharged after 11 L net negative diuresis, and subsequently placed on metolazone, which resulted in significant ongoing outpatient diuresis and elevation in creatinine prompting de-escalation  of diuretic therapy.  In May 2022, he was admitted with left lower extremity cellulitis and left foot hematoma requiring I&D along with antibiotic therapy.  Echocardiogram in September 2022 showed slight improvement in EF to 40-45% with global hypokinesis and significant septal hypokinesis.  Imaging was challenging and RV function was reported as being normal.  In December 2022, weight was trending up and metolazone was increased to 2.5 mg daily with addition of torsemide 40 mg daily.  Renal function remained stable.  He was noted to have some weight gain at March 15 follow-up, which he attributed to increasing abdominal fat.  At cardiology visit in May 10, he reported taking an additional 2.5 mg of metolazone a few times a week to try and maintain a dry weight of approximately 275 pounds.  At that time, he had not required metolazone in approximately 2 weeks.  Since his last visit, he has continued to do well.  His weight has been stable at approximately 272 to 273 pounds on his home scale and he has been taking torsemide 40 mg daily with an extra 20 mg twice a week, along with metolazone 2.5 mg daily.  [name] no longer taking any additional metolazone.  He has been feeling well without significant dyspnea.  He has chronic, mild lower extremity swelling which in  addition to diuretics, he managed with compression socks.  He denies chest pain, palpitations, PND, orthopnea, dizziness, syncope, or early satiety.  Home Medications    Current Outpatient Medications  Medication Sig Dispense Refill   amiodarone (PACERONE) 200 MG tablet Take 1 tablet (200 mg total) by mouth daily. 30 tablet 0   apixaban (ELIQUIS) 5 MG TABS tablet Take 1 tablet (5 mg total) 2 (two) times daily by mouth. 180 tablet 2   Bromelains (BROMELAIN PO) Take 2 capsules by mouth daily.     carvedilol (COREG) 12.5 MG tablet TAKE (1) TABLET TWICE A DAY WITH FOOD---BREAKFAST AND SUPPER. 180 tablet 11   CINNAMON PO Take 1,200 mg by mouth in the  morning and at bedtime. [place] Cinnamon '1200mg'$      clonazePAM (KLONOPIN) 1 MG tablet Take 1 mg by mouth See admin instructions. Take 1 tablet (1 mg) by mouth scheduled at bedtime, may take an additional dose during the day if needed for anxiety  4   Coenzyme Q10 (COQ10) 400 MG CAPS Take 400 mg by mouth 2 (two) times daily.     docusate sodium (COLACE) 100 MG capsule Take 300 mg in the am & 200 mg in the pm.     Dulaglutide 4.5 MG/0.5ML SOPN Inject into the skin once a week.     glipiZIDE (GLUCOTROL) 10 MG tablet Take 10 mg by mouth 2 (two) times daily before a meal.     HAWTHORNE BERRY PO Take 565 mg by mouth at bedtime.     Insulin Glargine (TOUJEO SOLOSTAR [place]) Inject 100 Units into the skin as needed.     JARDIANCE 10 MG TABS tablet Take 10 mg by mouth every evening.   6   levothyroxine (SYNTHROID) 50 MCG tablet Take by mouth daily at 12 noon.     losartan (COZAAR) 25 MG tablet Take 0.5 tablets (12.5 mg total) by mouth daily. 30 tablet 6   magnesium oxide (MAG-OX) 400 (241.3 Mg) MG tablet Take 1 tablet (400 mg total) by mouth daily. 30 tablet 6   MELATONIN-THEANINE PO Take 2 capsules by mouth at bedtime. Olly Sleep Gummies     metolazone (ZAROXOLYN) 2.5 MG tablet TAKE 1 TABLET BY MOUTH ONCE DAILY IN THE MORNING. TAKE ALONG WITH YOUR MORNING DOSE OF TORSEMIDE. 30 tablet 0   Multiple Vitamins-Minerals (PRESERVISION AREDS 2 PO) Take 1 tablet by mouth in the morning and at bedtime.     potassium chloride SA (KLOR-CON) 20 MEQ tablet Take 2 tablets (40 mEq total) by mouth daily. 30 tablet 6   rosuvastatin (CRESTOR) 20 MG tablet Take 20 mg by mouth every evening.      tirzepatide [facility]) 12.5 MG/0.5ML Pen Inject into the skin once a week.     Torsemide 40 MG TABS Take 40 mg by mouth as directed. Take 40 mg once daily with extra 20 mg as needed for weight greater than 275 60 tablet 3   valACYclovir (VALTREX) 1000 MG tablet Take 1,000 mg by mouth daily as needed.     vitamin C (ASCORBIC ACID) 500 MG  tablet Take 500 mg by mouth at bedtime.     zinc gluconate 50 MG tablet Take 50 mg by mouth daily.     No current facility-administered medications for this visit.     Review of Systems    Overall feeling well since his last visit.  He denies chest pain, dyspnea, palpitations, PND, orthopnea, dizziness, syncope, or early satiety.  His chronic, mild lower extremity  swelling.  All other systems reviewed and are otherwise negative except as noted above.    Physical Exam    VS:  BP (!) 100/58 (BP Location: Left Arm, Patient Position: Sitting, Cuff Size: Normal)   Pulse 72   Ht 6' (1.829 m)   Wt 275 lb 4 oz (124.9 kg)   SpO2 96%   BMI 37.33 kg/m  , BMI Body mass index is 37.33 kg/m.     GEN: Obese, in no acute distress. HEENT: normal. Neck: Supple, obese, difficult to gauge JVP.  No bruits or masses.   Cardiac: RRR, no murmurs, rubs, or gallops. No clubbing, cyanosis, trace bilateral lower extremity edema.  Radials/DP/PT 2+ and equal bilaterally.  Respiratory:  Respirations regular and unlabored, clear to auscultation bilaterally. GI: Obese, nontender, nondistended, BS + x 4. MS: no deformity or atrophy. Skin: warm and dry, no rash. Neuro:  Strength and sensation are intact. Psych: Normal affect.  Accessory Clinical Findings    ECG personally reviewed by me today -regular sinus rhythm, 72, first-degree AV block, left axis deviation, right bundle branch block- no acute changes.  Labs from primary care provider September 21, 2021  Hemoglobin A1c 8.0  Sodium 137, potassium 3.4, chloride 91, CO2 34, BUN 54, creatinine 2.27, glucose 206 Calcium 10.0 Hemoglobin 14.3, hematocrit 42.3, WBC 7.9, platelets 168  Lab Results  Component Value Date   TSH 3.465 10/11/2021    Lab Results  Component Value Date   ALT 20 10/11/2021   AST 26 10/11/2021   ALKPHOS 64 10/11/2021   BILITOT 0.8 10/11/2021    Assessment & Plan    1.  Chronic heart failure with midrange ejection  fraction/ischemic cardiomyopathy: EF previously as low as 15 to 20% with subsequent improvement to 40-45% by most recent echo in September 2022.  He has been taking torsemide 40 mg daily along with metolazone 2.5 mg daily.  Twice a week, he has been taking an additional torsemide 20 mg daily.  With this regimen, his weight has been stable at approximately 270 to 273 pounds on his home scale and he has been feeling well.  He remains on beta-blocker, ARB, and SGLT2 inhibitor.  In the setting of chronically soft blood pressures (100/58 today), I do not think he would tolerate Entresto.  I offered to follow-up basic metabolic panel today however, he prefers to have this performed through his endocrinologist office in the next couple weeks.  2.  Coronary artery disease: Status post prior bare-metal stenting of the proximal LAD 1998 with diagnostic catheterization from 2022 showing moderate, nonobstructive CAD.  He has not been experiencing chest pain or dyspnea and remains on beta-blocker and statin therapy.  No aspirin in the setting of Eliquis.  3.  Essential hypertension: Stable to soft.  4.  Hyperlipidemia: He remains on statin therapy with lipids followed by primary care.  5.  Paroxysmal atrial fibrillation: Maintaining sinus rhythm on amiodarone.  Normal LFTs and TSH in May.  [name] anticoagulated on Eliquis with normal H&H in April.  6.  Stage III chronic kidney disease/solitary kidney: Stable creatinine at 2.27 in April.  We discussed repeating labs today however, he would prefer to have this performed through his endocrinologist office as he is pending visit in 2 weeks.  7.  Morbid obesity: No recent weight gain.  Not currently exercising.  8.  Disposition: Patient will have follow-up labs with endocrinology/primary care.  Follow-up in clinic in 3 months or sooner if necessary.   Murray Hodgkins,  NP 01/18/2022, 4:25 PM

## 2022-01-21 NOTE — Progress Notes (Signed)
  Subjective:  Patient ID: [name], male    DOB: [birth year],  MRN: [id number]    71 y.o. male returns for at risk diabetic foot care overall doing well.  No new wounds.  His nails are thickened and elongated, he often bleeds when he tries to cut them himself.  [name] still on Eliquis.    Review of Systems: Negative except as noted in the HPI. Denies N/V/F/Ch.   Objective:  There were no vitals filed for this visit. There is no height or weight on file to calculate BMI. Constitutional Well developed. Well nourished.  Vascular Foot warm and well perfused. Capillary refill normal to all digits.   Neurologic Normal speech. Oriented to person, place, and time. Epicritic sensation to light touch grossly reduced bilaterally.  Dermatologic No recurrence of wound or ulceration.  He has thickened elongated toenails x10 bilaterally with subungual brown and yellow discoloration.   Orthopedic: No pain in the Achilles tendon now    Assessment:   1. Pain due to onychomycosis of toenails of both feet      Plan:  Patient was evaluated and treated and all questions answered.   Patient educated on diabetes. Discussed proper diabetic foot care and discussed risks and complications of disease. Educated patient in depth on reasons to return to the office immediately should he/she discover anything concerning or new on the feet. All questions answered. Discussed proper shoes as well.   Discussed the etiology and treatment options for the condition in detail with the patient.  Recommended debridement of the nails today. Sharp and mechanical debridement performed of all painful and mycotic nails today. Nails debrided in length and thickness using a nail nipper to level of comfort. Discussed treatment options including appropriate shoe gear. Follow up as needed for painful nails.  All symptomatic hyperkeratoses were safely debrided with a sterile #15 blade to patient's level of comfort without  incident. We discussed preventative and palliative care of these lesions including supportive and accommodative shoegear, padding, prefabricated and custom molded accommodative orthoses, use of a pumice stone and lotions/creams daily.       Return in about 3 months (around 04/19/2022) for at risk diabetic foot care.

## 2022-01-29 ENCOUNTER — Ambulatory Visit (INDEPENDENT_AMBULATORY_CARE_PROVIDER_SITE_OTHER): Payer: Medicare Other | Admitting: Dermatology

## 2022-01-29 DIAGNOSIS — D692 Other nonthrombocytopenic purpura: Secondary | ICD-10-CM | POA: Diagnosis not present

## 2022-01-29 DIAGNOSIS — I872 Venous insufficiency (chronic) (peripheral): Secondary | ICD-10-CM | POA: Diagnosis not present

## 2022-01-29 DIAGNOSIS — L821 Other seborrheic keratosis: Secondary | ICD-10-CM

## 2022-01-29 DIAGNOSIS — L219 Seborrheic dermatitis, unspecified: Secondary | ICD-10-CM

## 2022-01-29 DIAGNOSIS — Z79899 Other long term (current) drug therapy: Secondary | ICD-10-CM

## 2022-01-29 DIAGNOSIS — L409 Psoriasis, unspecified: Secondary | ICD-10-CM

## 2022-01-29 DIAGNOSIS — L570 Actinic keratosis: Secondary | ICD-10-CM | POA: Diagnosis not present

## 2022-01-29 DIAGNOSIS — L578 Other skin changes due to chronic exposure to nonionizing radiation: Secondary | ICD-10-CM

## 2022-01-29 DIAGNOSIS — Z1283 Encounter for screening for malignant neoplasm of skin: Secondary | ICD-10-CM | POA: Diagnosis not present

## 2022-01-29 DIAGNOSIS — L814 Other melanin hyperpigmentation: Secondary | ICD-10-CM

## 2022-01-29 DIAGNOSIS — Z5111 Encounter for antineoplastic chemotherapy: Secondary | ICD-10-CM

## 2022-01-29 DIAGNOSIS — D1800 Hemangioma unspecified site: Secondary | ICD-10-CM

## 2022-01-29 DIAGNOSIS — D229 Melanocytic nevi, unspecified: Secondary | ICD-10-CM

## 2022-01-29 MED ORDER — KETOCONAZOLE 2 % EX SHAM: 1.0000 | MEDICATED_SHAMPOO | CUTANEOUS | 3 refills | Status: DC

## 2022-01-29 MED ORDER — MOMETASONE FUROATE 0.1 % EX CREA: 1.0000 | TOPICAL_CREAM | Freq: Every day | CUTANEOUS | 11 refills | Status: DC

## 2022-01-29 NOTE — Patient Instructions (Signed)
Instructions for [facility] Medications  One or more of your medications was sent to the [facility] mail order compounding pharmacy. You will receive an email from them and can purchase the medicine through that link. It will then be mailed to your home at the address you confirmed. If for any reason you do not receive an email from them, please check your spam folder. If you still do not find the email, please let us know. [facility] phone number is [phone].    Cryotherapy Aftercare  Wash gently with soap and water everyday.   Apply Vaseline and Band-Aid daily until healed.   Due to recent changes in healthcare laws, you may see results of your pathology and/or laboratory studies on MyChart before the doctors have had a chance to review them. We understand that in some cases there may be results that are confusing or concerning to you. Please understand that not all results are received at the same time and often the doctors may need to interpret multiple results in order to provide you with the best plan of care or course of treatment. Therefore, we ask that you please give us 2 business days to thoroughly review all your results before contacting the office for clarification. Should we see a critical lab result, you will be contacted sooner.   If You Need Anything After Your Visit  If you have any questions or concerns for your doctor, please call our main line at [phone] and press option 4 to reach your doctor's medical assistant. If no one answers, please leave a voicemail as directed and we will return your call as soon as possible. Messages left after 4 pm will be answered the following business day.   You may also send us a message via MyChart. We typically respond to MyChart messages within 1-2 business days.  For prescription refills, please ask your pharmacy to contact our office. Our fax number is [phone].  If you have an urgent issue when the clinic is  closed that cannot wait until the next business day, you can page your doctor at the number below.    Please note that while we do our best to be available for urgent issues outside of office hours, we are not available 24/7.   If you have an urgent issue and are unable to reach us, you may choose to seek medical care at your doctor's office, retail clinic, urgent care center, or emergency room.  If you have a medical emergency, please immediately call 911 or go to the emergency department.  Pager Numbers  - Dr. Kowalski: [phone]  - Dr. Moye: [phone]  - Dr. Stewart: [phone]  In the event of inclement weather, please call our main line at [phone] for an update on the status of any delays or closures.  Dermatology Medication Tips: Please keep the boxes that topical medications come in in order to help keep track of the instructions about where and how to use these. Pharmacies typically print the medication instructions only on the boxes and not directly on the medication tubes.   If your medication is too expensive, please contact our office at [phone] option 4 or send us a message through MyChart.   We are unable to tell what your co-pay for medications will be in advance as this is different depending on your insurance coverage. However, we may be able to find a substitute medication at lower cost or fill out paperwork to get insurance to cover a   needed medication.   If a prior authorization is required to get your medication covered by your insurance company, please allow us 1-2 business days to complete this process.  Drug prices often vary depending on where the prescription is filled and some pharmacies may offer cheaper prices.  The website www.goodrx.com contains coupons for medications through different pharmacies. The prices here do not account for what the cost may be with help from insurance (it may be cheaper with your insurance), but the website can  give you the price if you did not use any insurance.  - You can print the associated coupon and take it with your prescription to the pharmacy.  - You may also stop by our office during regular business hours and pick up a GoodRx coupon card.  - If you need your prescription sent electronically to a different pharmacy, notify our office through [facility] MyChart or by phone at [phone] option 4.     Si Usted Necesita Algo Despus de Su Visita  Tambin puede enviarnos un mensaje a travs de MyChart. Por lo general respondemos a los mensajes de MyChart en el transcurso de 1 a 2 das hbiles.  Para renovar recetas, por favor pida a su farmacia que se ponga en contacto con nuestra oficina. Nuestro nmero de fax es el [phone].  Si tiene un asunto urgente cuando la clnica est cerrada y que no puede esperar hasta el siguiente da hbil, puede llamar/localizar a su doctor(a) al nmero que aparece a continuacin.   Por favor, tenga en cuenta que aunque hacemos todo lo posible para estar disponibles para asuntos urgentes fuera del horario de oficina, no estamos disponibles las 24 horas del da, los 7 das de la semana.   Si tiene un problema urgente y no puede comunicarse con nosotros, puede optar por buscar atencin mdica  en el consultorio de su doctor(a), en una clnica privada, en un centro de atencin urgente o en una sala de emergencias.  Si tiene una emergencia mdica, por favor llame inmediatamente al 911 o vaya a la sala de emergencias.  Nmeros de bper  - Dr. Kowalski: [phone]  - Dra. Moye: [phone]  - Dra. Stewart: [phone]  En caso de inclemencias del tiempo, por favor llame a nuestra lnea principal al [phone] para una actualizacin sobre el estado de cualquier retraso o cierre.  Consejos para la medicacin en dermatologa: Por favor, guarde las cajas en las que vienen los medicamentos de uso tpico para ayudarle a seguir las instrucciones sobre  dnde y cmo usarlos. Las farmacias generalmente imprimen las instrucciones del medicamento slo en las cajas y no directamente en los tubos del medicamento.   Si su medicamento es muy caro, por favor, pngase en contacto con nuestra oficina llamando al [phone] y presione la opcin 4 o envenos un mensaje a travs de MyChart.   No podemos decirle cul ser su copago por los medicamentos por adelantado ya que esto es diferente dependiendo de la cobertura de su seguro. Sin embargo, es posible que podamos encontrar un medicamento sustituto a menor costo o llenar un formulario para que el seguro cubra el medicamento que se considera necesario.   Si se requiere una autorizacin previa para que su compaa de seguros cubra su medicamento, por favor permtanos de 1 a 2 das hbiles para completar este proceso.  Los precios de los medicamentos varan con frecuencia dependiendo del lugar de dnde se surte la receta y alguna farmacias pueden ofrecer precios ms baratos.    El sitio web www.goodrx.com tiene cupones para medicamentos de diferentes farmacias. Los precios aqu no tienen en cuenta lo que podra costar con la ayuda del seguro (puede ser ms barato con su seguro), pero el sitio web puede darle el precio si no utiliz ningn seguro.  - Puede imprimir el cupn correspondiente y llevarlo con su receta a la farmacia.  - Tambin puede pasar por nuestra oficina durante el horario de atencin regular y recoger una tarjeta de cupones de GoodRx.  - Si necesita que su receta se enve electrnicamente a una farmacia diferente, informe a nuestra oficina a travs de MyChart de [place] o por telfono llamando al [phone] y presione la opcin 4.  

## 2022-01-29 NOTE — Progress Notes (Unsigned)
New Patient Visit  Subjective  [name] is a 71 y.o. male who presents for the following: Other (New patient - desires TBSE - no history of skin cancer or abnormal moles. The patient presents for Total-Body Skin Exam (TBSE) for skin cancer screening and mole check.  The patient has spots, moles and lesions to be evaluated, some may be new or changing and the patient has concerns that these could be cancer./).  The following portions of the chart were reviewed this encounter and updated as appropriate:   Tobacco  Allergies  Meds  Problems  Med Hx  Surg Hx  Fam Hx     Review of Systems:  No other skin or systemic complaints except as noted in HPI or Assessment and Plan.  Objective  Well appearing patient in no apparent distress; mood and affect are within normal limits.  A full examination was performed including scalp, head, eyes, ears, nose, lips, neck, chest, axillae, abdomen, back, buttocks, bilateral upper extremities, bilateral lower extremities, hands, feet, fingers, toes, fingernails, and toenails. All findings within normal limits unless otherwise noted below.  Trunk, legs Psoriatic plaques  Scalp, forehead (2) Hyperkeratotic pink papules     Face Pinkness and scale  Legs Stasis changes  Legs Purpura   Assessment & Plan   Lentigines - Scattered tan macules - Due to sun exposure - Benign-appearing, observe - Recommend daily broad spectrum sunscreen SPF 30+ to sun-exposed areas, reapply every 2 hours as needed. - Call for any changes  Seborrheic Keratoses - Stuck-on, waxy, tan-brown papules and/or plaques  - Benign-appearing - Discussed benign etiology and prognosis. - Observe - Call for any changes  Melanocytic Nevi - Tan-brown and/or pink-flesh-colored symmetric macules and papules - Benign appearing on exam today - Observation - Call clinic for new or changing moles - Recommend daily use of broad spectrum spf 30+ sunscreen to  sun-exposed areas.   Hemangiomas - Red papules - Discussed benign nature - Observe - Call for any changes  Actinic Damage - Chronic condition, secondary to cumulative UV/sun exposure - diffuse scaly erythematous macules with underlying dyspigmentation - Recommend daily broad spectrum sunscreen SPF 30+ to sun-exposed areas, reapply every 2 hours as needed.  - Staying in the shade or wearing long sleeves, sun glasses (UVA+UVB protection) and wide brim hats (4-inch brim around the entire circumference of the hat) are also recommended for sun protection.  - Call for new or changing lesions.  Skin cancer screening performed today.  AK (actinic keratosis) (2) Scalp, forehead Hypertrophic Aks - LN2 x 2 of scalp today. Will plan to treat more on follow up as needed  Actinic Damage - Severe, confluent actinic changes with pre-cancerous actinic keratoses  - Severe, chronic, not at goal, secondary to cumulative UV radiation exposure over time - diffuse scaly erythematous macules and papules with underlying dyspigmentation - Discussed Prescription "Field Treatment" for Severe, Chronic Confluent Actinic Changes with Pre-Cancerous Actinic Keratoses Field treatment involves treatment of an entire area of skin that has confluent Actinic Changes (Sun/ Ultraviolet light damage) and PreCancerous Actinic Keratoses by method of PhotoDynamic Therapy (PDT) and/or prescription Topical Chemotherapy agents such as 5-fluorouracil, 5-fluorouracil/calcipotriene, and/or imiquimod.  The purpose is to decrease the number of clinically evident and subclinical PreCancerous lesions to prevent progression to development of skin cancer by chemically destroying early precancer changes that may or may not be visible.  It has been shown to reduce the risk of developing skin cancer in the treated area. As a  result of treatment, redness, scaling, crusting, and open sores may occur during treatment course. One or more than one of  these methods may be used and may have to be used several times to control, suppress and eliminate the PreCancerous changes. Discussed treatment course, expected reaction, and possible side effects. - Recommend daily broad spectrum sunscreen SPF 30+ to sun-exposed areas, reapply every 2 hours as needed.  - Staying in the shade or wearing long sleeves, sun glasses (UVA+UVB protection) and wide brim hats (4-inch brim around the entire circumference of the hat) are also recommended. - Call for new or changing lesions.  - Start 5-fluorouracil/calcipotriene cream twice a day for 10 days to affected areas including scalp. Prescription sent to [facility]. Patient advised they will receive an email to purchase the medication online and have it sent to their home. Patient provided with handout reviewing treatment course and side effects and advised to call or message [place] on MyChart with any concerns.   Destruction of lesion - Scalp, forehead Complexity: simple   Destruction method: cryotherapy   Informed consent: discussed and consent obtained   Timeout:  patient name, date of birth, surgical site, and procedure verified Lesion destroyed using liquid nitrogen: Yes   Region frozen until ice ball extended beyond lesion: Yes   Outcome: patient tolerated procedure well with no complications   Post-procedure details: wound care instructions given    Seborrheic dermatitis/SEBO psoriasis Face Seborrheic Dermatitis  -  is a chronic persistent rash characterized by pinkness and scaling most commonly of the mid face but also can occur on the scalp (dandruff), ears; mid chest, mid back and groin.  It tends to be exacerbated by stress and cooler weather.  People who have neurologic disease may experience new onset or exacerbation of existing seborrheic dermatitis.  The condition is not curable but treatable and can be controlled.  Start ketoconazole 2% shampoo Wash beard and face 2 times  per week. May continue Tea Tree oil shampoo on the other days  ketoconazole (NIZORAL) 2 % shampoo - Face Apply 1 Application topically 2 (two) times a week.  Venous stasis dermatitis of both lower extremities Legs Stasis in the legs causes chronic leg swelling, which may result in itchy or painful rashes, skin discoloration, skin texture changes, and sometimes ulceration.  Recommend daily graduated compression hose/stockings- easiest to put on first thing in morning, remove at bedtime.  Elevate legs as much as possible. Avoid salt/sodium rich foods.  Purpura ([place]) Legs Purpura - Chronic; persistent and recurrent.  Treatable, but not curable. - Violaceous macules and patches - Benign - Related to trauma, age, sun damage and/or use of blood thinners, chronic use of topical and/or oral steroids - Observe - Can use OTC arnica containing moisturizer such as Dermend Bruise Formula if desired - Call for worsening or other concerns  Psoriasis Trunk, legs Psoriasis is a chronic non-curable, but treatable genetic/hereditary disease that may have other systemic features affecting other organ systems such as joints (Psoriatic Arthritis). It is associated with an increased risk of inflammatory bowel disease, heart disease, non-alcoholic fatty liver disease, and depression.    Start Mometasone cream qd to affected areas  mometasone (ELOCON) 0.1 % cream - Trunk, legs Apply 1 Application topically daily. To areas of psoriasis  Return in about 3 months (around 05/01/2022) for AK follow up.  I, Ashok Cordia, CMA, am acting as scribe for Sarina Ser, MD . Documentation: I have reviewed the above documentation for accuracy and  completeness, and I agree with the above.  Sarina Ser, MD

## 2022-01-30 ENCOUNTER — Encounter: Payer: Self-pay | Admitting: Dermatology

## 2022-02-14 ENCOUNTER — Other Ambulatory Visit: Payer: Self-pay | Admitting: *Deleted

## 2022-02-14 ENCOUNTER — Encounter: Payer: Self-pay | Admitting: Internal Medicine

## 2022-02-14 DIAGNOSIS — I5022 Chronic systolic (congestive) heart failure: Secondary | ICD-10-CM

## 2022-02-14 DIAGNOSIS — E876 Hypokalemia: Secondary | ICD-10-CM

## 2022-02-14 DIAGNOSIS — I480 Paroxysmal atrial fibrillation: Secondary | ICD-10-CM

## 2022-02-14 MED ORDER — POTASSIUM CHLORIDE CRYS ER 20 MEQ PO TBCR: 40.0000 meq | EXTENDED_RELEASE_TABLET | Freq: Two times a day (BID) | ORAL | 2 refills | Status: DC

## 2022-02-14 NOTE — Telephone Encounter (Signed)
Please let [name] know that I agree with Dr. Murrell Converse thoughts that his diuretics are likely contributing to his low potassium.  I recommend that we have him increase potassium chloride to 40 mEq twice daily with repeat BMP in about a week.  He can have this done through our office or his PCP.  If he elects to have it done through his PCP, please have them fax the results to [place].  Nelva Bush, MD [facility]

## 2022-02-22 ENCOUNTER — Other Ambulatory Visit
Admission: RE | Admit: 2022-02-22 | Discharge: 2022-02-22 | Disposition: A | Discharge status: Home / Self Care | Payer: Medicare Other | Attending: Internal Medicine | Admitting: Internal Medicine

## 2022-02-22 DIAGNOSIS — I5022 Chronic systolic (congestive) heart failure: Secondary | ICD-10-CM | POA: Diagnosis present

## 2022-02-22 DIAGNOSIS — E876 Hypokalemia: Secondary | ICD-10-CM | POA: Insufficient documentation

## 2022-02-22 DIAGNOSIS — I480 Paroxysmal atrial fibrillation: Secondary | ICD-10-CM | POA: Diagnosis present

## 2022-02-22 LAB — BASIC METABOLIC PANEL
Anion gap: 12 (ref 5–15)
BUN: 57 mg/dL — ABNORMAL HIGH (ref 8–23)
CO2: 30 mmol/L (ref 22–32)
Calcium: 9.1 mg/dL (ref 8.9–10.3)
Chloride: 90 mmol/L — ABNORMAL LOW (ref 98–111)
Creatinine, Ser: 2.37 mg/dL — ABNORMAL HIGH (ref 0.61–1.24)
GFR, Estimated: 29 mL/min — ABNORMAL LOW (ref >60)
Glucose, Bld: 112 mg/dL — ABNORMAL HIGH (ref 70–99)
Potassium: 2.9 mmol/L — ABNORMAL LOW (ref 3.5–5.1)
Sodium: 132 mmol/L — ABNORMAL LOW (ref 135–145)

## 2022-02-23 ENCOUNTER — Telehealth: Payer: Self-pay

## 2022-02-23 DIAGNOSIS — I5022 Chronic systolic (congestive) heart failure: Secondary | ICD-10-CM

## 2022-02-23 MED ORDER — POTASSIUM CHLORIDE CRYS ER 20 MEQ PO TBCR: 40.0000 meq | EXTENDED_RELEASE_TABLET | Freq: Three times a day (TID) | ORAL | 1 refills | Status: DC

## 2022-02-23 NOTE — Telephone Encounter (Signed)
Called and discussed result note with patient. He verbalized understanding and agreed with plan.

## 2022-02-23 NOTE — Telephone Encounter (Signed)
-----   Message from Nelva Bush, MD sent at 02/23/2022  6:28 AM EDT ----- Please let [name] know that his creatinine is stable.  His potassium is still low at 2.9.  I recommend that we have him hold his metolazone for 1 day and increase his standing potassium chloride to 40 mEq 3 times daily.  He should have a repeat basic metabolic panel through [place] or his PCP in about a week.

## 2022-03-02 ENCOUNTER — Encounter: Payer: Self-pay | Admitting: Internal Medicine

## 2022-03-02 ENCOUNTER — Other Ambulatory Visit
Admission: RE | Admit: 2022-03-02 | Discharge: 2022-03-02 | Disposition: A | Discharge status: Home / Self Care | Payer: Medicare Other | Attending: "Endocrinology | Admitting: "Endocrinology

## 2022-03-02 ENCOUNTER — Other Ambulatory Visit
Admission: RE | Admit: 2022-03-02 | Discharge: 2022-03-02 | Disposition: A | Discharge status: Home / Self Care | Payer: Medicare Other | Attending: Internal Medicine | Admitting: Internal Medicine

## 2022-03-02 DIAGNOSIS — I5022 Chronic systolic (congestive) heart failure: Secondary | ICD-10-CM | POA: Insufficient documentation

## 2022-03-02 LAB — HEPATIC FUNCTION PANEL
ALT: 25 U/L (ref 0–44)
AST: 26 U/L (ref 15–41)
Albumin: 4 g/dL (ref 3.5–5.0)
Alkaline Phosphatase: 76 U/L (ref 38–126)
Bilirubin, Direct: <0.1 mg/dL (ref 0.0–0.2)
Total Bilirubin: 0.8 mg/dL (ref 0.3–1.2)
Total Protein: 7.7 g/dL (ref 6.5–8.1)

## 2022-03-02 LAB — LIPID PANEL
Cholesterol: 107 mg/dL (ref 0–200)
HDL: 30 mg/dL — ABNORMAL LOW (ref >40)
LDL Cholesterol: 0 mg/dL (ref 0–99)
Total CHOL/HDL Ratio: 3.6 RATIO
Triglycerides: 386 mg/dL — ABNORMAL HIGH (ref <150)
VLDL: 77 mg/dL — ABNORMAL HIGH (ref 0–40)

## 2022-03-02 LAB — BASIC METABOLIC PANEL
Anion gap: 11 (ref 5–15)
BUN: 63 mg/dL — ABNORMAL HIGH (ref 8–23)
CO2: 29 mmol/L (ref 22–32)
Calcium: 8.8 mg/dL — ABNORMAL LOW (ref 8.9–10.3)
Chloride: 95 mmol/L — ABNORMAL LOW (ref 98–111)
Creatinine, Ser: 2.35 mg/dL — ABNORMAL HIGH (ref 0.61–1.24)
GFR, Estimated: 29 mL/min — ABNORMAL LOW (ref >60)
Glucose, Bld: 176 mg/dL — ABNORMAL HIGH (ref 70–99)
Potassium: 3.2 mmol/L — ABNORMAL LOW (ref 3.5–5.1)
Sodium: 135 mmol/L (ref 135–145)

## 2022-03-02 LAB — TSH: TSH: 2.645 u[IU]/mL (ref 0.350–4.500)

## 2022-03-03 LAB — MICROALBUMIN, URINE: Microalb, Ur: 4.8 ug/mL — ABNORMAL HIGH

## 2022-04-02 ENCOUNTER — Encounter (INDEPENDENT_AMBULATORY_CARE_PROVIDER_SITE_OTHER): Payer: Self-pay

## 2022-04-13 ENCOUNTER — Telehealth: Payer: Self-pay | Admitting: Internal Medicine

## 2022-04-13 NOTE — Telephone Encounter (Signed)
Patient stated he would prefer to have a tele-visit with C. Sharolyn Douglas instead of his in-office f/u visit on 11/22.

## 2022-04-13 NOTE — Telephone Encounter (Signed)
A telephone visit should be fine.

## 2022-04-18 ENCOUNTER — Ambulatory Visit: Payer: Medicare Other | Admitting: Podiatry

## 2022-04-25 ENCOUNTER — Ambulatory Visit: Payer: Medicare Other | Admitting: Cardiology

## 2022-04-25 ENCOUNTER — Ambulatory Visit: Payer: Medicare Other | Admitting: Nurse Practitioner

## 2022-04-30 ENCOUNTER — Other Ambulatory Visit: Payer: Self-pay | Admitting: Internal Medicine

## 2022-05-03 ENCOUNTER — Ambulatory Visit: Payer: Medicare Other | Admitting: Dermatology

## 2022-05-15 ENCOUNTER — Ambulatory Visit: Payer: Medicare Other | Admitting: Cardiology

## 2022-05-29 ENCOUNTER — Other Ambulatory Visit: Payer: Self-pay | Admitting: Internal Medicine

## 2022-06-04 ENCOUNTER — Encounter: Payer: Self-pay | Admitting: Internal Medicine

## 2022-06-05 ENCOUNTER — Ambulatory Visit: Payer: Medicare Other | Admitting: Cardiology

## 2022-06-05 NOTE — Progress Notes (Deleted)
Cardiology Clinic Note   Patient Name: [name] Date of Encounter: 06/05/2022  Primary Care Provider:  Vidal Schwalbe, MD Primary Cardiologist:  Nelva Bush, MD  Patient Profile    72 year old male with a history of HFrEF, ICM, CAD status post PCI to the proximal LAD, paroxysmal atrial fibrillation, stage III chronic kidney disease, left nephrectomy, hypertension, diabetes, sleep apnea, hyperlipidemia, pulmonary hypertension, who presents today for follow-up related to HFrEF.  Past Medical History    Past Medical History:  Diagnosis Date   CKD (chronic kidney disease) stage 3, GFR 30-59 ml/min (HCC)    Coronary artery disease    a. 1998 s/p ACS Multi-link BMS to the prox LAD ([place], [place]); b. 07/2015 Cath: LM nl, LAD patent stent, LCX min irregs, OM1/2 min irregs, OM3 nl, RCA min irregs; c. 07/2020 Cath: LM nl, LAD 50p/m, 70d, D2 70, LCX 40p/35m OM3 40, RCA 60d->Med Rx.   Diabetes mellitus type 2, uncontrolled    a. A1c 11.0 in 07/2015.   HFrEF    a. EF 25% by cath in 2013; b. Echo in 07/2015 showing EF of 15-20%, moderate MR, moderate Pulm HTN, severely dilated IVC; c. 11/2016 Echo: EF 55-60%, no rwma, Gr1 DD, mildly dil LA, nl RV fxn. d. 12/2019 Echo: EF 40-45%, e. 07/2020 Echo: EF 20-25%, sev red RV fxx; f. 02/2021 Echo: EF 40-45%, glob HK/signif sept HK. Nl RV fxn.   History of kidney stones    Hypertension    Ischemic cardiomyopathy    Kidney stones    Left   Paroxysmal atrial fibrillation ([place]    a. Dx 07/2015; b. CHA2DS2VASc = 5-->eliquis. Rhythm control w/ amiodarone.   Pollen allergies 09/21/2015   pt called and stated that he woke up and had some drainage-pt states he did not see the color of the drainage-pt denies running a fever and this only happened once-pt instructed to call Dr BAudree Baneoffice if he starts running a fever or if the color of drainage becomes yellow/green   Psoriasis    Pulmonary hypertension ([place]    Sleep apnea    Past Surgical  History:  Procedure Laterality Date   CARDIAC CATHETERIZATION N/A 07/25/2015   Procedure: Right/Left Heart Cath and Coronary Angiography;  Surgeon: MWellington Hampshire MD;  [facility];  Service: Cardiovascular;  Laterality: N/A;   C[place] CATHETERIZATION     [place]  CORONARY ANGIOPLASTY WITH STENT PLACEMENT     CYSTOSCOPY WITH STENT PLACEMENT Left 09/07/2015   Procedure: CYSTOSCOPY WITH STENT PLACEMENT;  Surgeon: AHollice Espy MD;  Location: [facility];  Service: Urology;  Laterality: Left;   CYSTOSCOPY/URETEROSCOPY/HOLMIUM LASER/STENT PLACEMENT Left 10/25/2015   Procedure: CYSTOSCOPY/URETEROSCOPY/HOLMIUM LASER/STENT EXCHANGE;  Surgeon: AHollice Espy MD;  Location: [facility];  Service: Urology;  Laterality: Left;   CYSTOSCOPY/URETEROSCOPY/HOLMIUM LASER/STENT PLACEMENT Left 05/11/2019   Procedure: CYSTOSCOPY/URETEROSCOPY/HOLMIUM LASER/STENT PLACEMENT;  Surgeon: BHollice Espy MD;  Location: [facility];  Service: Urology;  Laterality: Left;   EXTRACORPOREAL SHOCK WAVE LITHOTRIPSY Left 04/02/2019   Procedure: EXTRACORPOREAL SHOCK WAVE LITHOTRIPSY (ESWL);  Surgeon: BHollice Espy MD;  Location: [facility];  Service: Urology;  Laterality: Left;   EXTRACORPOREAL SHOCK WAVE LITHOTRIPSY Left 03/05/2019   Procedure: EXTRACORPOREAL SHOCK WAVE LITHOTRIPSY (ESWL);  Surgeon: SAbbie Sons MD;  Location: [facility];  Service: Urology;  Laterality: Left;   EYE SURGERY Bilateral    LASER FOR GLAUCOMA   IRRIGATION AND DEBRIDEMENT  FOOT Left 09/30/2020   Procedure: IRRIGATION AND DEBRIDEMENT FOOT;  Surgeon: Criselda Peaches, DPM;  Location: [facility];  Service: Podiatry;  Laterality: Left;   KIDNEY SURGERY     LOWER EXTREMITY ANGIOGRAPHY Left 10/04/2020   Procedure: Lower Extremity Angiography;  Surgeon: Katha Cabal, MD;  Location: [facility];  Service: Cardiovascular;  Laterality: Left;   NEPHROSTOMY TUBE PLACEMENT ([place] [facility])  Left 11/2019   RIGHT/LEFT HEART CATH AND CORONARY ANGIOGRAPHY N/A 07/12/2020   Procedure: RIGHT/LEFT HEART CATH AND CORONARY ANGIOGRAPHY;  Surgeon: Nelva Bush, MD;  Location: [facility];  Service: Cardiovascular;  Laterality: N/A;   ROBOT ASSISTED LAPAROSCOPIC NEPHRECTOMY Left 12/10/2019   Procedure: XI ROBOTIC ASSISTED LAPAROSCOPIC NEPHRECTOMY;  Surgeon: Hollice Espy, MD;  Location: [facility];  Service: Urology;  Laterality: Left;   URETEROSCOPY WITH HOLMIUM LASER LITHOTRIPSY Left 09/07/2015   Procedure: URETEROSCOPY WITH HOLMIUM LASER LITHOTRIPSY;  Surgeon: Hollice Espy, MD;  Location: [facility];  Service: Urology;  Laterality: Left;    Allergies  Allergies  Allergen Reactions   Other Hives and Other (See Comments)    Blue cheese Blue cheese Blue cheese    History of Present Illness    [name] is a 72 year old male with previously mentioned past medical history including chronic HFrEF, ischemic cardiomyopathy, CAD status post remote PCI to proximal LAD, pulmonary hypertension, paroxysmal atrial fibrillation, stage III chronic kidney disease, left nephrectomy, hypertension, hyperlipidemia, diabetes, and sleep apnea.  He was originally diagnosed with atrial fibrillation in November 2017, and he is managed with amiodarone and apixaban.  History of cardiac myopathy dates back to at least 2013 at that time his EF was 25%.  In February 2022 with worsening lower extremity edema despite titration of outpatient furosemide dosing.  Echocardiogram showed severe biventricular failure with EF of 20-25% and severely reduced RV function.  Right and left heart catheterization showed moderate nonobstructive coronary artery disease, similar to prior study in 2017 he also had significantly elevated right heart pressures.  He was admitted and subsequently discharged after he was diuresed approximately 11 L.  He was placed on metolazone which resulted in significant outpatient diuresis and  elevation of creatinine prompting de-escalation of diuretic therapy.  Repeat echocardiogram done in September 2020 showed slight improvement in EF of 40-45% with global hypokinesis and significant septal hypokinesis.  In December 2022 his weight was trending up, telepsych was increased to 2.5 mg daily with the addition of torsemide 40 mg daily.  Renal function remained stable.  He was last seen in clinic 01/18/2022 at that time he continues to do well.  His weight has been stable approximately 270 to 273 pounds on his home scale and he has been taking torsemide 40 mg daily with an extra 20 mg twice a week along with metolazone 2.5 mg daily.  He was not taking any additional metolazone at that time.  He did have blood work that was ordered at that appointment he was noted to be hypokalemic.  Potassium was increased to 40 mEq with the request to repeat within 1 week.  He returns to clinic today  Home Medications    Current Outpatient Medications  Medication Sig Dispense Refill   amiodarone (PACERONE) 200 MG tablet Take 1 tablet (200 mg total) by mouth daily. 30 tablet 0   apixaban (ELIQUIS) 5 MG TABS tablet Take 1 tablet (5 mg total) 2 (two) times daily by mouth. 180 tablet 2   Bromelains (BROMELAIN PO) Take 2 capsules by  mouth daily.     carvedilol (COREG) 12.5 MG tablet TAKE (1) TABLET TWICE A DAY WITH FOOD---BREAKFAST AND SUPPER. 180 tablet 0   CINNAMON PO Take 1,200 mg by mouth in the morning and at bedtime. [place] Cinnamon '1200mg'$      clonazePAM (KLONOPIN) 1 MG tablet Take 1 mg by mouth See admin instructions. Take 1 tablet (1 mg) by mouth scheduled at bedtime, may take an additional dose during the day if needed for anxiety  4   Coenzyme Q10 (COQ10) 400 MG CAPS Take 400 mg by mouth 2 (two) times daily.     docusate sodium (COLACE) 100 MG capsule Take 300 mg in the am & 200 mg in the pm.     Dulaglutide 4.5 MG/0.5ML SOPN Inject into the skin once a week.     glipiZIDE (GLUCOTROL) 10 MG tablet  Take 10 mg by mouth 2 (two) times daily before a meal.     HAWTHORNE BERRY PO Take 565 mg by mouth at bedtime.     Insulin Glargine (TOUJEO SOLOSTAR [place]) Inject 100 Units into the skin as needed.     JARDIANCE 10 MG TABS tablet Take 10 mg by mouth every evening.   6   ketoconazole (NIZORAL) 2 % shampoo Apply 1 Application topically 2 (two) times a week. 120 mL 3   levothyroxine (SYNTHROID) 50 MCG tablet Take by mouth daily at 12 noon.     losartan (COZAAR) 25 MG tablet Take 0.5 tablets (12.5 mg total) by mouth daily. 30 tablet 6   magnesium oxide (MAG-OX) 400 (241.3 Mg) MG tablet Take 1 tablet (400 mg total) by mouth daily. 30 tablet 6   MELATONIN-THEANINE PO Take 2 capsules by mouth at bedtime. Olly Sleep Gummies     metolazone (ZAROXOLYN) 2.5 MG tablet TAKE 1 TABLET BY MOUTH ONCE DAILY IN THE MORNING. TAKE ALONG WITH YOUR MORNING DOSE OF TORSEMIDE. 30 tablet 0   mometasone (ELOCON) 0.1 % cream Apply 1 Application topically daily. To areas of psoriasis 45 g 11   Multiple Vitamins-Minerals (PRESERVISION AREDS 2 PO) Take 1 tablet by mouth in the morning and at bedtime.     potassium chloride SA (KLOR-CON M) 20 MEQ tablet TAKE TWO TABLETS BY MOUTH THREE TIMES DAILY 180 tablet 0   rosuvastatin (CRESTOR) 20 MG tablet Take 20 mg by mouth every evening.      tirzepatide [facility]) 12.5 MG/0.5ML Pen Inject into the skin once a week.     Torsemide 40 MG TABS Take 40 mg by mouth as directed. Take 40 mg once daily with extra 20 mg as needed for weight greater than 275 60 tablet 3   valACYclovir (VALTREX) 1000 MG tablet Take 1,000 mg by mouth daily as needed.     vitamin C (ASCORBIC ACID) 500 MG tablet Take 500 mg by mouth at bedtime.     zinc gluconate 50 MG tablet Take 50 mg by mouth daily.     No current facility-administered medications for this visit.     Family History    Family History  Problem Relation Age of Onset   Heart failure Father    Heart attack Brother    Kidney cancer Neg Hx     Bladder Cancer Neg Hx    Prostate cancer Neg Hx    He indicated that his mother is deceased. He indicated that his father is deceased. He indicated that his brother is alive. He indicated that the status of his neg hx is unknown.  Social History    Social History   Socioeconomic History   Marital status: Single    Spouse name: Not on file   Number of children: Not on file   Years of education: Not on file   Highest education level: Not on file  Occupational History   Not on file  Tobacco Use   Smoking status: Never   Smokeless tobacco: Never  Vaping Use   Vaping Use: Never used  Substance and Sexual Activity   Alcohol use: Yes    Alcohol/week: 0.0 standard drinks of alcohol    Comment: occasionally   Drug use: No    Types: Marijuana, Cocaine, Opium, LSD    Comment: Past, greater than 10 years ago   Sexual activity: Not on file  Other Topics Concern   Not on file  Social History Narrative   Not on file   Social Determinants of Health   Financial Resource Strain: Not on file  Food Insecurity: Not on file  Transportation Needs: Not on file  Physical Activity: Not on file  Stress: Not on file  Social Connections: Not on file  Intimate Partner Violence: Not on file     Review of Systems    General:  No chills, fever, night sweats or weight changes.  Cardiovascular:  No chest pain, dyspnea on exertion, edema, orthopnea, palpitations, paroxysmal nocturnal dyspnea. Dermatological: No rash, lesions/masses Respiratory: No cough, dyspnea Urologic: No hematuria, dysuria Abdominal:   No nausea, vomiting, diarrhea, bright red blood per rectum, melena, or hematemesis Neurologic:  No visual changes, wkns, changes in mental status. All other systems reviewed and are otherwise negative except as noted above.     Physical Exam    VS:  There were no vitals taken for this visit. , BMI There is no height or weight on file to calculate BMI.     GEN: Well nourished, well  developed, in no acute distress. HEENT: normal. Neck: Supple, no JVD, carotid bruits, or masses. Cardiac: RRR, no murmurs, rubs, or gallops. No clubbing, cyanosis, edema.  Radials 2+/PT 2+ and equal bilaterally.  Respiratory:  Respirations regular and unlabored, clear to auscultation bilaterally. GI: Soft, nontender, nondistended, BS + x 4. MS: no deformity or atrophy. Skin: warm and dry, no rash. Neuro:  Strength and sensation are intact. Psych: Normal affect.  Accessory Clinical Findings    ECG personally reviewed by me today- *** - No acute changes  Lab Results  Component Value Date   WBC 5.7 10/04/2020   HGB 10.8 (L) 10/04/2020   HCT 32.6 (L) 10/04/2020   MCV 95.9 10/04/2020   PLT 166 10/04/2020   Lab Results  Component Value Date   CREATININE 2.35 (H) 03/02/2022   BUN 63 (H) 03/02/2022   NA 135 03/02/2022   K 3.2 (L) 03/02/2022   CL 95 (L) 03/02/2022   CO2 29 03/02/2022   Lab Results  Component Value Date   ALT 25 03/02/2022   AST 26 03/02/2022   ALKPHOS 76 03/02/2022   BILITOT 0.8 03/02/2022   Lab Results  Component Value Date   CHOL 107 03/02/2022   HDL 30 (L) 03/02/2022   LDLCALC 0 03/02/2022   TRIG 386 (H) 03/02/2022   CHOLHDL 3.6 03/02/2022    Lab Results  Component Value Date   HGBA1C 10.2 (H) 01/11/2021    Assessment & Plan   1.  ***  Maty Zeisler, NP 06/05/2022, 1:21 PM

## 2022-06-11 ENCOUNTER — Ambulatory Visit: Payer: Medicare Other | Admitting: Podiatry

## 2022-07-16 ENCOUNTER — Ambulatory Visit (INDEPENDENT_AMBULATORY_CARE_PROVIDER_SITE_OTHER): Payer: 59 | Admitting: Internal Medicine

## 2022-07-16 ENCOUNTER — Encounter: Payer: Self-pay | Admitting: Internal Medicine

## 2022-07-16 VITALS — BP 116/62 | HR 80 | Temp 97.4°F | Resp 18 | Ht 72.0 in | Wt 260.8 lb

## 2022-07-16 DIAGNOSIS — Z794 Long term (current) use of insulin: Secondary | ICD-10-CM

## 2022-07-16 DIAGNOSIS — Z905 Acquired absence of kidney: Secondary | ICD-10-CM

## 2022-07-16 DIAGNOSIS — F419 Anxiety disorder, unspecified: Secondary | ICD-10-CM

## 2022-07-16 DIAGNOSIS — E039 Hypothyroidism, unspecified: Secondary | ICD-10-CM

## 2022-07-16 DIAGNOSIS — I10 Essential (primary) hypertension: Secondary | ICD-10-CM | POA: Diagnosis not present

## 2022-07-16 DIAGNOSIS — I2510 Atherosclerotic heart disease of native coronary artery without angina pectoris: Secondary | ICD-10-CM

## 2022-07-16 DIAGNOSIS — I5042 Chronic combined systolic (congestive) and diastolic (congestive) heart failure: Secondary | ICD-10-CM

## 2022-07-16 DIAGNOSIS — I480 Paroxysmal atrial fibrillation: Secondary | ICD-10-CM

## 2022-07-16 DIAGNOSIS — E782 Mixed hyperlipidemia: Secondary | ICD-10-CM

## 2022-07-16 DIAGNOSIS — E1165 Type 2 diabetes mellitus with hyperglycemia: Secondary | ICD-10-CM

## 2022-07-16 DIAGNOSIS — G4733 Obstructive sleep apnea (adult) (pediatric): Secondary | ICD-10-CM

## 2022-07-16 DIAGNOSIS — N184 Chronic kidney disease, stage 4 (severe): Secondary | ICD-10-CM

## 2022-07-16 NOTE — Patient Instructions (Addendum)
It was great seeing you today!  Plan discussed at today's visit: -Klonopin taper: decrease to 0.5 mg daily for 2 weeks, then decrease further to 0.25 mg for 2 weeks. Plan to be off medication in 1 month. If you decide you want something for anxiety in the meantime, let me know.   Follow up in: 4 weeks  Take care and let us know if you have any questions or concerns prior to your next visit.  Dr. Rosana Berger

## 2022-07-16 NOTE — Progress Notes (Signed)
New Patient Office Visit  Subjective    Patient ID: [name], male    DOB: February 11, 1951  Age: 72 y.o. MRN: [id number]  CC:  Chief Complaint  Patient presents with   [place], eye dr. Rob Hickman, endocrine-[facility], Podiatry-[facility], Cardio-[facility], uroloogy-[place] [facility]    HPI [name] presents to establish care.  Hypertension/CHF/A.Fib/NICM/OSA: -Medications: Amiodarone 200 mg, Eliquis 5 mg BID, Coreg 12.5 mg BID, Losartan 12.5 mg, Jardiance 10 mg, KCl 40 meQ TID, Torsemide 40 mg daily (extra 20 mg as needed), Metolazone 2.5 mg -Patient is compliant with above medications and reports no side effects. -Checking BP at home (average): not checking -Denies any SOB, CP, vision changes, LE edema or symptoms of hypotension -Following with Cardiology, last seen 01/18/22 -Last echo 9/22 with improved EF to 40-45% -Compliant with CPAP  HLD/Hx of MI/CAD: -Medications: Crestor 20 mg -Patient is compliant with above medications and reports no side effects.  -Had one bare-metal cardiac stent placed in 1998 -Last cardiac catheterization in 2022 with moderate and nonobstructive CAD -Last lipid panel: Lipid Panel     Component Value Date/Time   CHOL 107 03/02/2022 1530   TRIG 386 (H) 03/02/2022 1530   HDL 30 (L) 03/02/2022 1530   CHOLHDL 3.6 03/02/2022 1530   VLDL 77 (H) 03/02/2022 1530   LDLCALC 0 03/02/2022 1530    Diabetes, Type 2: -Follows with Dr. Honor Junes, last seen 03/02/22 -Last A1c 6.9% -Medications: Glipizide 10 mg BID, Jardiance 10 mg, Mounjaro 15.0 mg, Toujeo 100 units daily  -Patient is compliant with the above medications and reports no side effects.  -Checking BG at home: Dexcom 7  -Eye exam: UTD - follows with Ophthalmology at [facility], 2/24, follows with her twice a year. 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   -Foot exam: UTD, follows with podiatry  -Microalbumin: UTD 9/23 -Statin: yes -PNA vaccine:  UTD, Prevnar 20 at pharmacy  -Denies symptoms of hypoglycemia, polyuria, polydipsia, numbness extremities, foot ulcers/trauma.   CKD3/History of Nephrolithiasis: -Last labs from 9/23 showing creatinine 2.35, GFR 29 -Following with Nephrology, last seen 03/12/22 -History of left nephrectomy in 2019 - due to hydronephrosis from obstruction of stones    Hypothyroidism: -Currently on Levothyroxine 50 mcg  -TSH 9/23 2.645  Recurrent Fever Blisters: -Currently on Valtrex PRN  Anxiety: -Currently on Klonopin 1 mg at night - has been on for 5 years  Health Maintenance: -Blood work UTD   Outpatient Encounter Medications as of 07/16/2022  Medication Sig   amiodarone (PACERONE) 200 MG tablet Take 1 tablet (200 mg total) by mouth daily.   apixaban (ELIQUIS) 5 MG TABS tablet Take 1 tablet (5 mg total) 2 (two) times daily by mouth.   Bromelains (BROMELAIN PO) Take 2 capsules by mouth daily.   carvedilol (COREG) 12.5 MG tablet TAKE (1) TABLET TWICE A DAY WITH FOOD---BREAKFAST AND SUPPER.   CINNAMON PO Take 1,200 mg by mouth in the morning and at bedtime. [place] Cinnamon 1270m   clonazePAM (KLONOPIN) 1 MG tablet Take 1 mg by mouth See admin instructions. Take 1 tablet (1 mg) by mouth scheduled at bedtime, may take an additional dose during the day if needed for anxiety   Coenzyme Q10 (COQ10) 400 MG CAPS Take 400 mg by mouth 2 (two) times daily.   docusate sodium (COLACE) 100 MG capsule Take 300 mg in the am & 200 mg in the pm.   glipiZIDE (GLUCOTROL) 10 MG  tablet Take 10 mg by mouth 2 (two) times daily before a meal.   HAWTHORNE BERRY PO Take 565 mg by mouth at bedtime.   Insulin Glargine (TOUJEO SOLOSTAR [place]) Inject 100 Units into the skin as needed.   JARDIANCE 10 MG TABS tablet Take 10 mg by mouth every evening.    ketoconazole (NIZORAL) 2 % shampoo Apply 1 Application topically 2 (two) times a week.   levothyroxine (SYNTHROID) 50 MCG tablet Take by mouth daily at 12 noon.   losartan (COZAAR)  25 MG tablet Take 0.5 tablets (12.5 mg total) by mouth daily.   magnesium oxide (MAG-OX) 400 (241.3 Mg) MG tablet Take 1 tablet (400 mg total) by mouth daily.   MELATONIN-THEANINE PO Take 2 capsules by mouth at bedtime. Olly Sleep Gummies   metolazone (ZAROXOLYN) 2.5 MG tablet TAKE 1 TABLET BY MOUTH ONCE DAILY IN THE MORNING. TAKE ALONG WITH YOUR MORNING DOSE OF TORSEMIDE.   mometasone (ELOCON) 0.1 % cream Apply 1 Application topically daily. To areas of psoriasis   Multiple Vitamins-Minerals (PRESERVISION AREDS 2 PO) Take 1 tablet by mouth in the morning and at bedtime.   potassium chloride SA (KLOR-CON M) 20 MEQ tablet TAKE TWO TABLETS BY MOUTH THREE TIMES DAILY   rosuvastatin (CRESTOR) 20 MG tablet Take 20 mg by mouth every evening.    tirzepatide [facility]) 12.5 MG/0.5ML Pen Inject into the skin once a week.   Torsemide 40 MG TABS Take 40 mg by mouth as directed. Take 40 mg once daily with extra 20 mg as needed for weight greater than 275   valACYclovir (VALTREX) 1000 MG tablet Take 1,000 mg by mouth daily as needed.   vitamin C (ASCORBIC ACID) 500 MG tablet Take 500 mg by mouth at bedtime.   zinc gluconate 50 MG tablet Take 50 mg by mouth daily.   [DISCONTINUED] Dulaglutide 4.5 MG/0.5ML SOPN Inject into the skin once a week.   No facility-administered encounter medications on file as of 07/16/2022.    Past Medical History:  Diagnosis Date   Allergy [phone]   Cataract    early stage   CHF (congestive heart failure) (HCC)    CKD (chronic kidney disease) stage 3, GFR 30-59 ml/min (HCC)    Coronary artery disease    a. 1998 s/p ACS Multi-link BMS to the prox LAD ([place], [place]); b. 07/2015 Cath: LM nl, LAD patent stent, LCX min irregs, OM1/2 min irregs, OM3 nl, RCA min irregs; c. 07/2020 Cath: LM nl, LAD 50p/m, 70d, D2 70, LCX 40p/89m OM3 40, RCA 60d->Med Rx.   Depression    Intermiteant   Diabetes mellitus type 2, uncontrolled    a. A1c 11.0 in 07/2015.   Glaucoma    Resolved with  laser   HFrEF    a. EF 25% by cath in 2013; b. Echo in 07/2015 showing EF of 15-20%, moderate MR, moderate Pulm HTN, severely dilated IVC; c. 11/2016 Echo: EF 55-60%, no rwma, Gr1 DD, mildly dil LA, nl RV fxn. d. 12/2019 Echo: EF 40-45%, e. 07/2020 Echo: EF 20-25%, sev red RV fxx; f. 02/2021 Echo: EF 40-45%, glob HK/signif sept HK. Nl RV fxn.   History of kidney stones    Hypertension    Ischemic cardiomyopathy    Kidney stones    Left   Myocardial infarction ([facility]    Paroxysmal atrial fibrillation ([place]    a. Dx 07/2015; b. CHA2DS2VASc = 5-->eliquis. Rhythm control w/ amiodarone.   Pollen allergies 09/21/2015   pt called  and stated that he woke up and had some drainage-pt states he did not see the color of the drainage-pt denies running a fever and this only happened once-pt instructed to call Dr Audree Bane office if he starts running a fever or if the color of drainage becomes yellow/green   Psoriasis    Pulmonary hypertension ([place])    Sleep apnea     Past Surgical History:  Procedure Laterality Date   CARDIAC CATHETERIZATION N/A 07/25/2015   Procedure: Right/Left Heart Cath and Coronary Angiography;  Surgeon: Wellington Hampshire, MD;  Location: [place] [facility];  Service: Cardiovascular;  Laterality: N/A;   [place], [place]   CORONARY ANGIOPLASTY WITH STENT PLACEMENT     CYSTOSCOPY WITH STENT PLACEMENT Left 09/07/2015   Procedure: CYSTOSCOPY WITH STENT PLACEMENT;  Surgeon: Hollice Espy, MD;  Location: [facility];  Service: Urology;  Laterality: Left;   CYSTOSCOPY/URETEROSCOPY/HOLMIUM LASER/STENT PLACEMENT Left 10/25/2015   Procedure: CYSTOSCOPY/URETEROSCOPY/HOLMIUM LASER/STENT EXCHANGE;  Surgeon: Hollice Espy, MD;  Location: [facility];  Service: Urology;  Laterality: Left;   CYSTOSCOPY/URETEROSCOPY/HOLMIUM LASER/STENT PLACEMENT Left 05/11/2019   Procedure: CYSTOSCOPY/URETEROSCOPY/HOLMIUM LASER/STENT PLACEMENT;   Surgeon: Hollice Espy, MD;  Location: [facility];  Service: Urology;  Laterality: Left;   EXTRACORPOREAL SHOCK WAVE LITHOTRIPSY Left 04/02/2019   Procedure: EXTRACORPOREAL SHOCK WAVE LITHOTRIPSY (ESWL);  Surgeon: Hollice Espy, MD;  Location: [facility];  Service: Urology;  Laterality: Left;   EXTRACORPOREAL SHOCK WAVE LITHOTRIPSY Left 03/05/2019   Procedure: EXTRACORPOREAL SHOCK WAVE LITHOTRIPSY (ESWL);  Surgeon: Abbie Sons, MD;  Location: [facility];  Service: Urology;  Laterality: Left;   EYE SURGERY Bilateral    LASER FOR GLAUCOMA   IRRIGATION AND DEBRIDEMENT FOOT Left 09/30/2020   Procedure: IRRIGATION AND DEBRIDEMENT FOOT;  Surgeon: Criselda Peaches, DPM;  Location: [facility];  Service: Podiatry;  Laterality: Left;   KIDNEY SURGERY     LOWER EXTREMITY ANGIOGRAPHY Left 10/04/2020   Procedure: Lower Extremity Angiography;  Surgeon: Katha Cabal, MD;  Location: [place] [facility];  Service: Cardiovascular;  Laterality: Left;   NEPHROSTOMY TUBE PLACEMENT ([place] H[facility]) Left 11/2019   RIGHT/LEFT HEART CATH AND CORONARY ANGIOGRAPHY N/A 07/12/2020   Procedure: RIGHT/LEFT HEART CATH AND CORONARY ANGIOGRAPHY;  Surgeon: Nelva Bush, MD;  Location: [place] [facility];  Service: Cardiovascular;  Laterality: N/A;   ROBOT ASSISTED LAPAROSCOPIC NEPHRECTOMY Left 12/10/2019   Procedure: XI ROBOTIC ASSISTED LAPAROSCOPIC NEPHRECTOMY;  Surgeon: Hollice Espy, MD;  Location: [facility];  Service: Urology;  Laterality: Left;   URETEROSCOPY WITH HOLMIUM LASER LITHOTRIPSY Left 09/07/2015   Procedure: URETEROSCOPY WITH HOLMIUM LASER LITHOTRIPSY;  Surgeon: Hollice Espy, MD;  Location: [facility];  Service: Urology;  Laterality: Left;    Family History  Problem Relation Age of Onset   Heart failure Father    Arthritis Father    Heart disease Father    Hypertension Father    Heart attack Brother    Diabetes Brother    Heart disease Brother    Hypertension Brother    Birth defects Sister     Early death Sister    Early death Brother    Heart disease Brother    Hypertension Brother    Kidney cancer Neg Hx    Bladder Cancer Neg Hx    Prostate cancer Neg Hx     Social History   Socioeconomic History   Marital status: Single    Spouse name: Not on  file   Number of children: Not on file   Years of education: Not on file   Highest education level: Not on file  Occupational History   Not on file  Tobacco Use   Smoking status: Never   Smokeless tobacco: Never  Vaping Use   Vaping Use: Never used  Substance and Sexual Activity   Alcohol use: Yes    Comment: less than 1   Drug use: Not Currently    Types: Cocaine, LSD, Marijuana, Opium    Comment: Past, greater than 10 years ago   Sexual activity: Not Currently    Birth control/protection: Condom  Other Topics Concern   Not on file  Social History Narrative   Not on file   Social Determinants of Health   Financial Resource Strain: Not on file  Food Insecurity: Not on file  Transportation Needs: Not on file  Physical Activity: Not on file  Stress: Not on file  Social Connections: Not on file  Intimate Partner Violence: Not on file    Review of Systems  Constitutional:  Negative for chills and fever.  Eyes:  Negative for blurred vision.  Respiratory:  Negative for shortness of breath.   Cardiovascular:  Negative for chest pain.  Gastrointestinal:  Negative for abdominal pain.      Objective    BP 116/62   Pulse 80   Temp (!) 97.4 F (36.3 C)   Resp 18   Ht 6' (1.829 m)   Wt 260 lb 12.8 oz (118.3 kg)   SpO2 96%   BMI 35.37 kg/m   Physical Exam Constitutional:      Appearance: Normal appearance.  HENT:     Head: Normocephalic and atraumatic.  Eyes:     Conjunctiva/sclera: Conjunctivae normal.  Cardiovascular:     Rate and Rhythm: Normal rate and regular rhythm.  Pulmonary:     Effort: Pulmonary effort is normal.     Breath sounds: Normal breath sounds.  Musculoskeletal:     Right lower  leg: No edema.     Left lower leg: No edema.  Skin:    General: Skin is warm and dry.  Neurological:     General: No focal deficit present.     Mental Status: [name] alert. Mental status is at baseline.  Psychiatric:        Mood and Affect: Mood normal.        Behavior: Behavior normal.         Assessment & Plan:   1. Hypertension, unspecified type/Chronic combined systolic and diastolic congestive heart failure (HCC)/Paroxysmal atrial fibrillation (HCC)/OSA (obstructive sleep apnea): Following with Cardiology, last note from 01/18/22 reviewed. No changes to medications, continue Amiodarone 200 mg, Eliquis 5 mg BID, Coreg 12.5 mg BID, Losartan 12.5 mg, Jardiance 10 mg, KCl 40 meQ TID, Torsemide 40 mg daily (extra 20 mg as needed), Metolazone 2.5 mg.  2. Mixed hyperlipidemia/Coronary artery disease involving native coronary artery of native heart without angina pectoris: Reviewed cardiac catheterization from 2022, medically treated with Crestor 20 mg. Will need an up dated lipid panel.   3. Type 2 diabetes mellitus with hyperglycemia, with long-term current use of insulin ([place]): Last A1c controlled, following with Endocrinology, note from 03/02/22 reviewed. Continue current regimen which consists of Glipizide 10 mg BID, Jardiance 10 mg, Mounjaro 15.0 mg, Toujeo 100 units daily.  4. Stage 4 chronic kidney disease (HCC)/S/p nephrectomy: Following with Nephrology, reviewed note and labs from 03/12/22.   5. Hypothyroidism, unspecified type: Stable,  continue Levothyroxine 50 mcg daily.   6. Anxiety: Currently on Klonopin 1 mg daily, discussed how I do not write for chronic benzodiazepines. I offered to refer to Psychiatry or taper off medication, patient agrees to taper off medication. Start 0.5 mg at night for 2 weeks, then go down to 0.25 mg nightly for 2 weeks. Follow up here in 1 month, may require every other day dosing to completely taper off. Discussed starting Celexa or Lexapro, patient  does not want anything else for anxiety right now. Continue to monitor and discuss again at follow up.    Patient expressed understanding and is in agreement with plan as outlined above. All questions were answered. Today, I spent 50 minutes on patient care, >50% of time was spent face-to-face with the patient.    Return in 4 weeks (on 08/13/2022).   Teodora Medici, DO

## 2022-07-18 ENCOUNTER — Other Ambulatory Visit: Payer: Self-pay | Admitting: Internal Medicine

## 2022-07-18 NOTE — Telephone Encounter (Signed)
Requested medication (s) are due for refill today:For review  Requested medication (s) are on the active medication list: yes all    Last refill:    Magnesium  06/22/22  #30  0 refills, failed due to labs                       Eliquis  04/17/17  180  2 refills, historical provider                      Glipizide 10/27/20  Amount not specified,, Historical provider                     Metolazone  10/17/21  #30  0 refills. Historical provider                     Potassium Chloride 05/29/22  #180  0 refills   Historical provider  Future visit scheduled yes  Notes to clinic:Please review, thank you.  Requested Prescriptions  Pending Prescriptions Disp Refills   magnesium oxide (MAG-OX) 400 (240 Mg) MG tablet [Pharmacy Med Name: magnesium oxide 400 mg (241.3 mg magnesium) tablet] 30 tablet 0    Sig: TAKE ONE TABLET BY MOUTH ONCE DAILY AS NEEDED     Endocrinology:  Minerals - Magnesium Supplementation Failed - 07/18/2022  2:34 PM      Failed - Mg Level in normal range and within 360 days    Magnesium  Date Value Ref Range Status  10/01/2020 2.3 1.7 - 2.4 mg/dL Final    Comment:    Performed at [facility], [address] [place], [place] [address]         Failed - Cr in normal range and within 360 days    Creatinine, Ser  Date Value Ref Range Status  03/02/2022 2.35 (H) 0.61 - 1.24 mg/dL Final         Passed - Valid encounter within last 12 months    Recent Outpatient Visits           2 days ago Hypertension, unspecified type   [facility] Teodora Medici, DO       Future Appointments             In 3 weeks Teodora Medici, [facility]             ELIQUIS 5 MG TABS tablet [Pharmacy Med Name: Eliquis 5 mg tablet] 60 tablet 0    Sig: TAKE ONE TABLET BY MOUTH TWICE DAILY     Hematology:  Anticoagulants - apixaban Failed - 07/18/2022  2:34 PM      Failed - PLT in normal range and within 360  days    Platelets  Date Value Ref Range Status  10/04/2020 166 150 - 400 K/uL Final  07/08/2020 144 (L) 150 - 450 x10E3/uL Final         Failed - HGB in normal range and within 360 days    Hemoglobin  Date Value Ref Range Status  10/04/2020 10.8 (L) 13.0 - 17.0 g/dL Final  07/08/2020 13.1 13.0 - 17.7 g/dL Final         Failed - HCT in normal range and within 360 days    HCT  Date Value Ref Range Status  10/04/2020 32.6 (L) 39.0 - 52.0 % Final   Hematocrit  Date Value Ref Range Status  07/08/2020 40.3 37.5 -  51.0 % Final         Failed - Cr in normal range and within 360 days    Creatinine, Ser  Date Value Ref Range Status  03/02/2022 2.35 (H) 0.61 - 1.24 mg/dL Final         Passed - AST in normal range and within 360 days    AST  Date Value Ref Range Status  03/02/2022 26 15 - 41 U/L Final         Passed - ALT in normal range and within 360 days    ALT  Date Value Ref Range Status  03/02/2022 25 0 - 44 U/L Final         Passed - Valid encounter within last 12 months    Recent Outpatient Visits           2 days ago Hypertension, unspecified type   [facility] Teodora Medici, DO       Future Appointments             In 3 weeks Teodora Medici, [facility], [facility]             glipiZIDE (GLUCOTROL) 10 MG tablet [Pharmacy Med Name: glipizide 10 mg tablet] 60 tablet 0    Sig: TAKE ONE TABLET BY MOUTH TWICE DAILY     Endocrinology:  Diabetes - Sulfonylureas Failed - 07/18/2022  2:34 PM      Failed - HBA1C is between 0 and 7.9 and within 180 days    Hgb A1c MFr Bld  Date Value Ref Range Status  01/11/2021 10.2 (H) 4.8 - 5.6 % Final    Comment:             Prediabetes: 5.7 - 6.4          Diabetes: >6.4          Glycemic control for adults with diabetes: <7.0          Failed - Cr in normal range and within 360 days    Creatinine, Ser  Date Value Ref Range Status  03/02/2022 2.35 (H) 0.61  - 1.24 mg/dL Final         Passed - Valid encounter within last 6 months    Recent Outpatient Visits           2 days ago Hypertension, unspecified type   [facility] Teodora Medici, DO       Future Appointments             In 3 weeks Teodora Medici, [facility], [facility]             metolazone (ZAROXOLYN) 2.5 MG tablet [Pharmacy Med Name: metolazone 2.5 mg tablet] 30 tablet 0    Sig: TAKE ONE TABLET BY MOUTH EVERY MORNING take along with torsemide     Cardiovascular:  Diuretics - Thiazide - metolazone Failed - 07/18/2022  2:34 PM      Failed - K in normal range and within 180 days    Potassium  Date Value Ref Range Status  03/02/2022 3.2 (L) 3.5 - 5.1 mmol/L Final         Failed - Cr in normal range and within 180 days    Creatinine, Ser  Date Value Ref Range Status  03/02/2022 2.35 (H) 0.61 - 1.24 mg/dL Final         Passed - Na in normal range and within 180  days    Sodium  Date Value Ref Range Status  03/02/2022 135 135 - 145 mmol/L Final  01/11/2021 137 134 - 144 mmol/L Final         Passed - Last BP in normal range    BP Readings from Last 1 Encounters:  07/16/22 116/62         Passed - Valid encounter within last 6 months    Recent Outpatient Visits           2 days ago Hypertension, unspecified type   [facility] Teodora Medici, DO       Future Appointments             In 3 weeks Teodora Medici, [facility], [facility]             potassium chloride SA (KLOR-CON M) 20 MEQ tablet [place] Med Name: potassium chloride ER 20 mEq tablet,extended release(part/cryst)] 180 tablet 0    Sig: TAKE TWO TABLETS BY [place]     Endocrinology:  Minerals - Potassium Supplementation Failed - 07/18/2022  2:34 PM      Failed - K in normal range and within 360 days    Potassium  Date Value Ref Range Status  03/02/2022  3.2 (L) 3.5 - 5.1 mmol/L Final         Failed - Cr in normal range and within 360 days    Creatinine, Ser  Date Value Ref Range Status  03/02/2022 2.35 (H) 0.61 - 1.24 mg/dL Final         Passed - Valid encounter within last 12 months    Recent Outpatient Visits           2 days ago Hypertension, unspecified type   [facility] Teodora Medici, DO       Future Appointments             In 3 weeks Teodora Medici, [facility], [facility]

## 2022-08-13 NOTE — Progress Notes (Signed)
Established Patient Office Visit  Subjective    Patient ID: [name], male    DOB: [birth year]  Age: 72 y.o. MRN: [id number]  CC:  No chief complaint on file.   HPI [name] presents to follow up on chronic medical conditions.  Hypertension/CHF/A.Fib/NICM/OSA: -Medications: Amiodarone 200 mg, Eliquis 5 mg BID, Coreg 12.5 mg BID, Losartan 12.5 mg, Jardiance 10 mg, KCl 40 meQ TID, Torsemide 40 mg daily (extra 20 mg as needed), Metolazone 2.5 mg -Patient is compliant with above medications and reports no side effects. -Checking BP at home (average): not checking -Denies any SOB, CP, vision changes, LE edema or symptoms of hypotension -Following with Cardiology, last seen 01/18/22 -Last echo 9/22 with improved EF to 40-45% -Compliant with CPAP  HLD/Hx of MI/CAD: -Medications: Crestor 20 mg -Patient is compliant with above medications and reports no side effects.  -Had one bare-metal cardiac stent placed in 1998 -Last cardiac catheterization in 2022 with moderate and nonobstructive CAD -Last lipid panel: Lipid Panel     Component Value Date/Time   CHOL 107 03/02/2022 1530   TRIG 386 (H) 03/02/2022 1530   HDL 30 (L) 03/02/2022 1530   CHOLHDL 3.6 03/02/2022 1530   VLDL 77 (H) 03/02/2022 1530   LDLCALC 0 03/02/2022 1530    Diabetes, Type 2: -Follows with Dr. Honor Junes, last seen 03/02/22 -Last A1c 6.9% -Medications: Glipizide 10 mg BID, Jardiance 10 mg, Mounjaro 15.0 mg, Toujeo 100 units daily  -Patient is compliant with the above medications and reports no side effects.  -Checking BG at home: Dexcom 7  -Eye exam: UTD - follows with Ophthalmology at [facility], 2/24, follows with her twice a year. 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   -Foot exam: UTD, follows with podiatry  -Microalbumin: UTD 9/23 -Statin: yes -PNA vaccine: UTD, Prevnar 20 at pharmacy  -Denies symptoms of hypoglycemia, polyuria, polydipsia, numbness extremities,  foot ulcers/trauma.   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 At [place] gave patient weaning plan. Today he states that   Health Maintenance: -Blood work UTD   Outpatient Encounter Medications as of 08/14/2022  Medication Sig   amiodarone (PACERONE) 200 MG tablet Take 1 tablet (200 mg total) by mouth daily.   apixaban (ELIQUIS) 5 MG TABS tablet TAKE ONE TABLET BY MOUTH TWICE DAILY   Bromelains (BROMELAIN PO) Take 2 capsules by mouth daily.   carvedilol (COREG) 12.5 MG tablet TAKE (1) TABLET TWICE A DAY WITH FOOD---BREAKFAST AND SUPPER.   CINNAMON PO Take 1,200 mg by mouth in the morning and at bedtime. Ceylon Cinnamon '1200mg'$    clonazePAM (KLONOPIN) 1 MG tablet Take 1 mg by mouth See admin instructions. Take 1 tablet (1 mg) by mouth scheduled at bedtime, may take an additional dose during the day if needed for anxiety   Coenzyme Q10 (COQ10) 400 MG CAPS Take 400 mg by mouth 2 (two) times daily.   docusate sodium (COLACE) 100 MG capsule Take 300 mg in the am & 200 mg in the pm.   glipiZIDE (GLUCOTROL) 10 MG tablet TAKE ONE TABLET BY MOUTH TWICE DAILY  HAWTHORNE BERRY PO Take 565 mg by mouth at bedtime.   Insulin Glargine (TOUJEO SOLOSTAR [place]) Inject 100 Units into the skin as needed.   JARDIANCE 10 MG TABS tablet Take 10 mg by mouth every evening.    ketoconazole (NIZORAL) 2 % shampoo Apply 1 Application topically 2 (two) times a week.   levothyroxine (SYNTHROID) 50 MCG tablet Take by mouth daily at 12 noon.   losartan (COZAAR) 25 MG tablet Take 0.5 tablets (12.5 mg total) by mouth daily.   magnesium oxide (MAG-OX)  400 (240 Mg) MG tablet TAKE ONE TABLET BY MOUTH ONCE DAILY AS NEEDED   magnesium oxide (MAG-OX) 400 (241.3 Mg) MG tablet Take 1 tablet (400 mg total) by mouth daily.   MELATONIN-THEANINE PO Take 2 capsules by mouth at bedtime. Olly Sleep Gummies   metolazone (ZAROXOLYN) 2.5 MG tablet TAKE ONE TABLET BY MOUTH EVERY MORNING take along with torsemide   mometasone (ELOCON) 0.1 % cream Apply 1 Application topically daily. To areas of psoriasis   Multiple Vitamins-Minerals (PRESERVISION AREDS 2 PO) Take 1 tablet by mouth in the morning and at bedtime.   potassium chloride SA (KLOR-CON M) 20 MEQ tablet TAKE TWO TABLETS BY MOUTH THREE TIMES DAILY   rosuvastatin (CRESTOR) 20 MG tablet Take 20 mg by mouth every evening.    tirzepatide [facility]) 12.5 MG/0.5ML Pen Inject into the skin once a week.   Torsemide 40 MG TABS Take 40 mg by mouth as directed. Take 40 mg once daily with extra 20 mg as needed for weight greater than 275   valACYclovir (VALTREX) 1000 MG tablet Take 1,000 mg by mouth daily as needed.   vitamin C (ASCORBIC ACID) 500 MG tablet Take 500 mg by mouth at bedtime.   zinc gluconate 50 MG tablet Take 50 mg by mouth daily.   No facility-administered encounter medications on file as of 08/14/2022.    Past Medical History:  Diagnosis Date   Allergy [phone]   Cataract    early stage   CHF (congestive heart failure) (HCC)    CKD (chronic kidney disease) stage 3, GFR 30-59 ml/min (HCC)    Coronary artery disease    a. 1998 s/p ACS Multi-link BMS to the prox LAD ([place], [place]); b. 07/2015 Cath: LM nl, LAD patent stent, LCX min irregs, OM1/2 min irregs, OM3 nl, RCA min irregs; c. 07/2020 Cath: LM nl, LAD 50p/m, 70d, D2 70, LCX 40p/66m OM3 40, RCA 60d->Med Rx.   Depression    Intermiteant   Diabetes mellitus type 2, uncontrolled    a. A1c 11.0 in 07/2015.   Glaucoma    Resolved with laser   HFrEF    a. EF 25% by cath in 2013; b. Echo in 07/2015 showing EF of 15-20%, moderate MR,  moderate Pulm HTN, severely dilated IVC; c. 11/2016 Echo: EF 55-60%, no rwma, Gr1 DD, mildly dil LA, nl RV fxn. d. 12/2019 Echo: EF 40-45%, e. 07/2020 Echo: EF 20-25%, sev red RV fxx; f. 02/2021 Echo: EF 40-45%, glob HK/signif sept HK. Nl RV fxn.   History of kidney stones    Hypertension    Ischemic cardiomyopathy    Kidney stones    Left   Myocardial infarction ([facility]    [place]    a. Dx 07/2015; b. CHA2DS2VASc = 5-->eliquis. Rhythm control w/ amiodarone.   Pollen allergies 09/21/2015   pt called and stated that he woke up and had some drainage-pt states he did not see the color  of the drainage-pt denies running a fever and this only happened once-pt instructed to call Dr Audree Bane office if he starts running a fever or if the color of drainage becomes yellow/green   Psoriasis    Pulmonary hypertension ([place])    Sleep apnea     Past Surgical History:  Procedure Laterality Date   CARDIAC CATHETERIZATION N/A 07/25/2015   Procedure: Right/Left Heart Cath and Coronary Angiography;  Surgeon: Wellington Hampshire, MD;  Location: [place] [facility];  Service: Cardiovascular;  Laterality: N/A;   [place], [place]   CORONARY ANGIOPLASTY WITH STENT PLACEMENT     CYSTOSCOPY WITH STENT PLACEMENT Left 09/07/2015   Procedure: CYSTOSCOPY WITH STENT PLACEMENT;  Surgeon: Hollice Espy, MD;  Location: [facility];  Service: Urology;  Laterality: Left;   CYSTOSCOPY/URETEROSCOPY/HOLMIUM LASER/STENT PLACEMENT Left 10/25/2015   Procedure: CYSTOSCOPY/URETEROSCOPY/HOLMIUM LASER/STENT EXCHANGE;  Surgeon: Hollice Espy, MD;  Location: [facility];  Service: Urology;  Laterality: Left;   CYSTOSCOPY/URETEROSCOPY/HOLMIUM LASER/STENT PLACEMENT Left 05/11/2019   Procedure: CYSTOSCOPY/URETEROSCOPY/HOLMIUM LASER/STENT PLACEMENT;  Surgeon: Hollice Espy, MD;  Location: [facility];  Service: Urology;  Laterality: Left;    EXTRACORPOREAL SHOCK WAVE LITHOTRIPSY Left 04/02/2019   Procedure: EXTRACORPOREAL SHOCK WAVE LITHOTRIPSY (ESWL);  Surgeon: Hollice Espy, MD;  Location: [facility];  Service: Urology;  Laterality: Left;   EXTRACORPOREAL SHOCK WAVE LITHOTRIPSY Left 03/05/2019   Procedure: EXTRACORPOREAL SHOCK WAVE LITHOTRIPSY (ESWL);  Surgeon: Abbie Sons, MD;  Location: [facility];  Service: Urology;  Laterality: Left;   EYE SURGERY Bilateral    LASER FOR GLAUCOMA   IRRIGATION AND DEBRIDEMENT FOOT Left 09/30/2020   Procedure: IRRIGATION AND DEBRIDEMENT FOOT;  Surgeon: Criselda Peaches, DPM;  Location: [facility];  Service: Podiatry;  Laterality: Left;   KIDNEY SURGERY     LOWER EXTREMITY ANGIOGRAPHY Left 10/04/2020   Procedure: Lower Extremity Angiography;  Surgeon: Katha Cabal, MD;  Location: [place] [facility];  Service: Cardiovascular;  Laterality: Left;   NEPHROSTOMY TUBE PLACEMENT ([place] HX) Left 11/2019   RIGHT/LEFT HEART CATH AND CORONARY ANGIOGRAPHY N/A 07/12/2020   Procedure: RIGHT/LEFT HEART CATH AND CORONARY ANGIOGRAPHY;  Surgeon: Nelva Bush, MD;  Location: [place] [facility];  Service: Cardiovascular;  Laterality: N/A;   ROBOT ASSISTED LAPAROSCOPIC NEPHRECTOMY Left 12/10/2019   Procedure: XI ROBOTIC ASSISTED LAPAROSCOPIC NEPHRECTOMY;  Surgeon: Hollice Espy, MD;  Location: [facility];  Service: Urology;  Laterality: Left;   URETEROSCOPY WITH HOLMIUM LASER LITHOTRIPSY Left 09/07/2015   Procedure: URETEROSCOPY WITH HOLMIUM LASER LITHOTRIPSY;  Surgeon: Hollice Espy, MD;  Location: [facility];  Service: Urology;  Laterality: Left;    Family History  Problem Relation Age of Onset   Heart failure Father    Arthritis Father    Heart disease Father    Hypertension Father    Heart attack Brother    Diabetes Brother    Heart disease Brother    Hypertension Brother    Birth defects Sister    Early death Sister    Early death Brother    Heart disease Brother    Hypertension Brother     Kidney cancer Neg Hx    Bladder Cancer Neg Hx    Prostate cancer Neg Hx     Social History   Socioeconomic History   Marital status: Single    Spouse name: Not on file   Number of children: Not on file   Years of education: Not on file  Highest education level: Not on file  Occupational History   Not on file  Tobacco Use   Smoking status: Never   Smokeless tobacco: Never  Vaping Use   Vaping Use: Never used  Substance and Sexual Activity   Alcohol use: Yes    Comment: less than 1   Drug use: Not Currently    Types: Cocaine, LSD, Marijuana, Opium    Comment: Past, greater than 10 years ago   Sexual activity: Not Currently    Birth control/protection: Condom  Other Topics Concern   Not on file  Social History Narrative   Not on file   Social Determinants of Health   Financial Resource Strain: Not on file  Food Insecurity: Not on file  Transportation Needs: Not on file  Physical Activity: Not on file  Stress: Not on file  Social Connections: Not on file  Intimate Partner Violence: Not on file    Review of Systems  Constitutional:  Negative for chills and fever.  Eyes:  Negative for blurred vision.  Respiratory:  Negative for shortness of breath.   Cardiovascular:  Negative for chest pain.  Gastrointestinal:  Negative for abdominal pain.      Objective    There were no vitals taken for this visit.  Physical Exam Constitutional:      Appearance: Normal appearance.  HENT:     Head: Normocephalic and atraumatic.  Eyes:     Conjunctiva/sclera: Conjunctivae normal.  Cardiovascular:     Rate and Rhythm: Normal rate and regular rhythm.  Pulmonary:     Effort: Pulmonary effort is normal.     Breath sounds: Normal breath sounds.  Musculoskeletal:     Right lower leg: No edema.     Left lower leg: No edema.  Skin:    General: Skin is warm and dry.  Neurological:     General: No focal deficit present.     Mental Status: [name] alert. Mental status is at  baseline.  Psychiatric:        Mood and Affect: Mood normal.        Behavior: Behavior normal.         Assessment & Plan:   1. Hypertension, unspecified type/Chronic combined systolic and diastolic congestive heart failure (HCC)/Paroxysmal atrial fibrillation (HCC)/OSA (obstructive sleep apnea): Following with Cardiology, last note from 01/18/22 reviewed. No changes to medications, continue Amiodarone 200 mg, Eliquis 5 mg BID, Coreg 12.5 mg BID, Losartan 12.5 mg, Jardiance 10 mg, KCl 40 meQ TID, Torsemide 40 mg daily (extra 20 mg as needed), Metolazone 2.5 mg.  2. Mixed hyperlipidemia/Coronary artery disease involving native coronary artery of native heart without angina pectoris: Reviewed cardiac catheterization from 2022, medically treated with Crestor 20 mg. Will need an up dated lipid panel.   3. Type 2 diabetes mellitus with hyperglycemia, with long-term current use of insulin ([place]): Last A1c controlled, following with Endocrinology, note from 03/02/22 reviewed. Continue current regimen which consists of Glipizide 10 mg BID, Jardiance 10 mg, Mounjaro 15.0 mg, Toujeo 100 units daily.  4. Stage 4 chronic kidney disease (HCC)/S/p nephrectomy: Following with Nephrology, reviewed note and labs from 03/12/22.   5. Hypothyroidism, unspecified type: Stable, continue Levothyroxine 50 mcg daily.   6. Anxiety: Currently on Klonopin 1 mg daily, discussed how I do not write for chronic benzodiazepines. I offered to refer to Psychiatry or taper off medication, patient agrees to taper off medication. Start 0.5 mg at night for 2 weeks, then go down to 0.25 mg  nightly for 2 weeks. Follow up here in 1 month, may require every other day dosing to completely taper off. Discussed starting Celexa or Lexapro, patient does not want anything else for anxiety right now. Continue to monitor and discuss again at follow up.    Patient expressed understanding and is in agreement with plan as outlined above. All  questions were answered. Today, I spent 50 minutes on patient care, >50% of time was spent face-to-face with the patient.    No follow-ups on file.   Teodora Medici, DO

## 2022-08-14 ENCOUNTER — Encounter: Payer: Self-pay | Admitting: Internal Medicine

## 2022-08-14 ENCOUNTER — Ambulatory Visit (INDEPENDENT_AMBULATORY_CARE_PROVIDER_SITE_OTHER): Payer: 59 | Admitting: Internal Medicine

## 2022-08-14 VITALS — BP 104/60 | HR 85 | Temp 97.7°F | Resp 18 | Ht 72.0 in | Wt 259.1 lb

## 2022-08-14 DIAGNOSIS — F419 Anxiety disorder, unspecified: Secondary | ICD-10-CM | POA: Diagnosis not present

## 2022-08-14 DIAGNOSIS — G4709 Other insomnia: Secondary | ICD-10-CM | POA: Diagnosis not present

## 2022-08-14 MED ORDER — TRAZODONE HCL 50 MG PO TABS: 50.0000 mg | ORAL_TABLET | Freq: Every evening | ORAL | 3 refills | Status: DC

## 2022-08-14 NOTE — Patient Instructions (Signed)
It was great seeing you today!  Plan discussed at today's visit: -Start Trazodone 50 mg at night for sleep, can double dose to 100 mg if needed  Follow up in: 6 months   Take care and let us know if you have any questions or concerns prior to your next visit.  Dr. Rosana Berger

## 2022-08-15 ENCOUNTER — Other Ambulatory Visit: Payer: Self-pay | Admitting: Internal Medicine

## 2022-08-16 NOTE — Telephone Encounter (Signed)
Requested medication (s) are due for refill today - unsure  Requested medication (s) are on the active medication list -yes  Future visit scheduled -yes  Last refill: 08/01/15  Notes to clinic: non delegated Rx  Requested Prescriptions  Pending Prescriptions Disp Refills   amiodarone (PACERONE) 200 MG tablet [Pharmacy Med Name: amiodarone 200 mg tablet] 30 tablet 4    Sig: TAKE 1 TABLET BY MOUTH ONCE DAILY.     Not Delegated - Cardiovascular: Antiarrhythmic Agents - amiodarone Failed - 08/15/2022 12:08 PM      Failed - This refill cannot be delegated      Failed - Manual Review: Eye exam recommended every 12 months      Failed - Mg Level in normal range and within 360 days    Magnesium  Date Value Ref Range Status  10/01/2020 2.3 1.7 - 2.4 mg/dL Final    Comment:    Performed at [facility], [address] [place], [place] [address]         Failed - K in normal range and within 180 days    Potassium  Date Value Ref Range Status  03/02/2022 3.2 (L) 3.5 - 5.1 mmol/L Final         Failed - Patient had ECG in the last 180 days      Failed - Patient had chest x-ray within the last 6 months      Passed - TSH in normal range and within 360 days    TSH  Date Value Ref Range Status  03/02/2022 2.645 0.350 - 4.500 uIU/mL Final    Comment:    Performed by a 3rd Generation assay with a functional sensitivity of <=0.01 uIU/mL. Performed at [facility], [address] [place], [place] [address]   08/26/2020 14.400 (H) 0.450 - 4.500 uIU/mL Final         Passed - AST in normal range and within 180 days    AST  Date Value Ref Range Status  03/02/2022 26 15 - 41 U/L Final         Passed - ALT in normal range and within 180 days    ALT  Date Value Ref Range Status  03/02/2022 25 0 - 44 U/L Final         Passed - Patient is not pregnant      Passed - Last BP in normal range    BP Readings from Last 1 Encounters:  08/14/22 104/60         Passed -  Last Heart Rate in normal range    Pulse Readings from Last 1 Encounters:  08/14/22 85         Passed - Valid encounter within last 6 months    Recent Outpatient Visits           2 days ago Other insomnia   [place], DO   1 month ago Hypertension, unspecified type   [facility] Teodora Medici, DO       Future Appointments             In 1 month  [facility], [place]   In 6 months Teodora Medici, [facility], [facility]               Requested Prescriptions  Pending Prescriptions Disp Refills   amiodarone (PACERONE) 200 MG tablet [Pharmacy Med Name: amiodarone 200 mg tablet] 30  tablet 4    Sig: TAKE 1 TABLET BY MOUTH ONCE DAILY.     Not Delegated - Cardiovascular: Antiarrhythmic Agents - amiodarone Failed - 08/15/2022 12:08 PM      Failed - This refill cannot be delegated      Failed - Manual Review: Eye exam recommended every 12 months      Failed - Mg Level in normal range and within 360 days    Magnesium  Date Value Ref Range Status  10/01/2020 2.3 1.7 - 2.4 mg/dL Final    Comment:    Performed at [facility], [address] [place], [place] [address]         Failed - K in normal range and within 180 days    Potassium  Date Value Ref Range Status  03/02/2022 3.2 (L) 3.5 - 5.1 mmol/L Final         Failed - Patient had ECG in the last 180 days      Failed - Patient had chest x-ray within the last 6 months      Passed - TSH in normal range and within 360 days    TSH  Date Value Ref Range Status  03/02/2022 2.645 0.350 - 4.500 uIU/mL Final    Comment:    Performed by a 3rd Generation assay with a functional sensitivity of <=0.01 uIU/mL. Performed at [facility], [address] [place],  [address]   08/26/2020 14.400 (H) 0.450 - 4.500 uIU/mL Final         Passed - AST in normal range and  within 180 days    AST  Date Value Ref Range Status  03/02/2022 26 15 - 41 U/L Final         Passed - ALT in normal range and within 180 days    ALT  Date Value Ref Range Status  03/02/2022 25 0 - 44 U/L Final         Passed - Patient is not pregnant      Passed - Last BP in normal range    BP Readings from Last 1 Encounters:  08/14/22 104/60         Passed - Last Heart Rate in normal range    Pulse Readings from Last 1 Encounters:  08/14/22 85         Passed - Valid encounter within last 6 months    Recent Outpatient Visits           2 days ago Other insomnia   [place], DO   1 month ago Hypertension, unspecified type   [facility] Teodora Medici, DO       Future Appointments             In 1 month  [facility], [place]   In 6 months Teodora Medici, [facility], [facility]

## 2022-08-21 ENCOUNTER — Other Ambulatory Visit: Payer: Self-pay | Admitting: Internal Medicine

## 2022-08-22 NOTE — Telephone Encounter (Signed)
Requested medication (s) are due for refill today: yes  Requested medication (s) are on the active medication list: yes  Last refill:  multiple dates  Future visit scheduled: yes  Notes to clinic:  Unable to refill per protocol, last refill by another provider.  Historical medication, routing for approval.     Requested Prescriptions  Pending Prescriptions Disp Refills   torsemide (DEMADEX) 20 MG tablet [Pharmacy Med Name: torsemide 20 mg tablet] 120 tablet 11    Sig: TAKE (2) TABLETS BY MOUTH TWICE DAILY.     Cardiovascular:  Diuretics - Loop Failed - 08/21/2022 10:01 AM      Failed - K in normal range and within 180 days    Potassium  Date Value Ref Range Status  03/02/2022 3.2 (L) 3.5 - 5.1 mmol/L Final         Failed - Ca in normal range and within 180 days    Calcium  Date Value Ref Range Status  03/02/2022 8.8 (L) 8.9 - 10.3 mg/dL Final         Failed - Cr in normal range and within 180 days    Creatinine, Ser  Date Value Ref Range Status  03/02/2022 2.35 (H) 0.61 - 1.24 mg/dL Final         Failed - Cl in normal range and within 180 days    Chloride  Date Value Ref Range Status  03/02/2022 95 (L) 98 - 111 mmol/L Final         Failed - Mg Level in normal range and within 180 days    Magnesium  Date Value Ref Range Status  10/01/2020 2.3 1.7 - 2.4 mg/dL Final    Comment:    Performed at [facility], [address] [place], [place] [address]         Passed - Na in normal range and within 180 days    Sodium  Date Value Ref Range Status  03/02/2022 135 135 - 145 mmol/L Final  01/11/2021 137 134 - 144 mmol/L Final         Passed - Last BP in normal range    BP Readings from Last 1 Encounters:  08/14/22 104/60         Passed - Valid encounter within last 6 months    Recent Outpatient Visits           1 week ago Other insomnia   [place], DO   1 month ago Hypertension, unspecified type    [facility] Teodora Medici, DO       Future Appointments             In 1 month  [facility], [place]   In 5 months Teodora Medici, [facility], [facility]             rosuvastatin (CRESTOR) 20 MG tablet [Pharmacy Med Name: rosuvastatin 20 mg tablet] 90 tablet 1    Sig: TAKE 1 TABLET BY MOUTH ONCE DAILY.     Cardiovascular:  Antilipid - Statins 2 Failed - 08/21/2022 10:01 AM      Failed - Cr in normal range and within 360 days    Creatinine, Ser  Date Value Ref Range Status  03/02/2022 2.35 (H) 0.61 - 1.24 mg/dL Final         Failed - Lipid Panel in normal range within the last 12 months    Cholesterol  Date Value Ref Range Status  03/02/2022 107 0 - 200 mg/dL Final   LDL Cholesterol  Date Value Ref Range Status  03/02/2022 0 0 - 99 mg/dL Final    Comment:           Total Cholesterol/HDL:CHD Risk Coronary Heart Disease Risk Table                     Men   Women  1/2 Average Risk   3.4   3.3  Average Risk       5.0   4.4  2 X Average Risk   9.6   7.1  3 X Average Risk  23.4   11.0        Use the calculated Patient Ratio above and the CHD Risk Table to determine the patient's CHD Risk.        ATP III CLASSIFICATION (LDL):  <100     mg/dL   Optimal  100-129  mg/dL   Near or Above                    Optimal  130-159  mg/dL   Borderline  160-189  mg/dL   High  >190     mg/dL   Very High Performed at [facility], [address], [place] [address]    HDL  Date Value Ref Range Status  03/02/2022 30 (L) >40 mg/dL Final   Triglycerides  Date Value Ref Range Status  03/02/2022 386 (H) <150 mg/dL Final         Passed - Patient is not pregnant      Passed - Valid encounter within last 12 months    Recent Outpatient Visits           1 week ago Other insomnia   [facility] Teodora Medici, DO   1 month ago Hypertension,  unspecified type   [facility] Teodora Medici, DO       Future Appointments             In 1 month  [facility], [place]   In 5 months Teodora Medici, [facility], [facility]

## 2022-08-27 ENCOUNTER — Ambulatory Visit (INDEPENDENT_AMBULATORY_CARE_PROVIDER_SITE_OTHER): Payer: 59 | Admitting: Podiatry

## 2022-08-27 ENCOUNTER — Encounter: Payer: Self-pay | Admitting: Podiatry

## 2022-08-27 VITALS — BP 98/58 | HR 87

## 2022-08-27 DIAGNOSIS — M79674 Pain in right toe(s): Secondary | ICD-10-CM

## 2022-08-27 DIAGNOSIS — B351 Tinea unguium: Secondary | ICD-10-CM | POA: Diagnosis not present

## 2022-08-27 DIAGNOSIS — M79675 Pain in left toe(s): Secondary | ICD-10-CM

## 2022-08-27 DIAGNOSIS — E118 Type 2 diabetes mellitus with unspecified complications: Secondary | ICD-10-CM

## 2022-08-28 ENCOUNTER — Other Ambulatory Visit: Payer: Self-pay | Admitting: Internal Medicine

## 2022-08-28 NOTE — Telephone Encounter (Signed)
Please contact pt for future appointment. Pt overdue for f/u needing refills.

## 2022-08-28 NOTE — Telephone Encounter (Signed)
Please refill if appropriate tomorrow during his appointment. Thank you!

## 2022-08-29 ENCOUNTER — Encounter: Payer: Self-pay | Admitting: Nurse Practitioner

## 2022-08-29 ENCOUNTER — Ambulatory Visit: Payer: 59 | Attending: Nurse Practitioner | Admitting: Nurse Practitioner

## 2022-08-29 VITALS — BP 100/62 | HR 84 | Ht 72.0 in | Wt 259.6 lb

## 2022-08-29 DIAGNOSIS — Z794 Long term (current) use of insulin: Secondary | ICD-10-CM | POA: Diagnosis not present

## 2022-08-29 DIAGNOSIS — E876 Hypokalemia: Secondary | ICD-10-CM | POA: Diagnosis not present

## 2022-08-29 DIAGNOSIS — I2510 Atherosclerotic heart disease of native coronary artery without angina pectoris: Secondary | ICD-10-CM | POA: Diagnosis not present

## 2022-08-29 DIAGNOSIS — Z79899 Other long term (current) drug therapy: Secondary | ICD-10-CM | POA: Diagnosis not present

## 2022-08-29 DIAGNOSIS — I10 Essential (primary) hypertension: Secondary | ICD-10-CM

## 2022-08-29 DIAGNOSIS — E1122 Type 2 diabetes mellitus with diabetic chronic kidney disease: Secondary | ICD-10-CM

## 2022-08-29 DIAGNOSIS — I255 Ischemic cardiomyopathy: Secondary | ICD-10-CM | POA: Diagnosis not present

## 2022-08-29 DIAGNOSIS — E785 Hyperlipidemia, unspecified: Secondary | ICD-10-CM | POA: Diagnosis not present

## 2022-08-29 DIAGNOSIS — I5022 Chronic systolic (congestive) heart failure: Secondary | ICD-10-CM

## 2022-08-29 DIAGNOSIS — N183 Chronic kidney disease, stage 3 unspecified: Secondary | ICD-10-CM

## 2022-08-29 DIAGNOSIS — I480 Paroxysmal atrial fibrillation: Secondary | ICD-10-CM

## 2022-08-29 MED ORDER — CARVEDILOL 12.5 MG PO TABS: ORAL_TABLET | ORAL | 1 refills | Status: DC

## 2022-08-29 NOTE — Patient Instructions (Signed)
Medication Instructions:  Your physician recommends that you continue on your current medications as directed. Please refer to the Current Medication list given to you today.  *If you need a refill on your cardiac medications before your next appointment, please call your pharmacy*  Lab Work: CMP, TSH, and CBC to be drawn at [employer] If you have labs (blood work) drawn today and your tests are completely normal, you will receive your results only by: [place] (if you have MyChart) OR A paper copy in the mail If you have any lab test that is abnormal or we need to change your treatment, we will call you to review the results.   Testing/Procedures: No testing ordered    Follow-Up: At [facility], you and your health needs are our priority.  As part of our continuing mission to provide you with exceptional heart care, we have created designated Provider Care Teams.  These Care Teams include your primary Cardiologist (physician) and Advanced Practice Providers (APPs -  Physician Assistants and Nurse Practitioners) who all work together to provide you with the care you need, when you need it.  We recommend signing up for the patient portal called "MyChart".  Sign up information is provided on this After Visit Summary.  MyChart is used to connect with patients for Virtual Visits (Telemedicine).  Patients are able to view lab/test results, encounter notes, upcoming appointments, etc.  Non-urgent messages can be sent to your provider as well.   To learn more about what you can do with MyChart, go to [email or web address].    Your next appointment:   6 month(s)  Provider:   You may see Nelva Bush, MD or one of the following Advanced Practice Providers on your designated Care Team:   Murray Hodgkins, NP Christell Faith, PA-C Cadence Kathlen Mody, PA-C Gerrie Nordmann, NP

## 2022-08-29 NOTE — Progress Notes (Signed)
Office Visit    Patient Name: [name] Date of Encounter: 08/29/2022  Primary Care Provider:  Teodora Medici, DO Primary Cardiologist:  Nelva Bush, MD  Chief Complaint    72 year old male with a history of chronic HFrEF, ischemic cardiomyopathy, CAD status post remote PCI to the proximal LAD, paroxysmal atrial fibrillation, stage III chronic kidney disease, left nephrectomy, hypertension, diabetes, sleep apnea, hyperlipidemia, and pulmonary hypertension, who presents for follow-up related to CHF.  Past Medical History    Past Medical History:  Diagnosis Date   Allergy [phone]   Cataract    early stage   CHF (congestive heart failure) (HCC)    CKD (chronic kidney disease) stage 3, GFR 30-59 ml/min (HCC)    Coronary artery disease    a. 1998 s/p ACS Multi-link BMS to the prox LAD ([place], [place]); b. 07/2015 Cath: LM nl, LAD patent stent, LCX min irregs, OM1/2 min irregs, OM3 nl, RCA min irregs; c. 07/2020 Cath: LM nl, LAD 50p/m, 70d, D2 70, LCX 40p/72m, OM3 40, RCA 60d->Med Rx.   Depression    Intermiteant   Diabetes mellitus type 2, uncontrolled    a. A1c 11.0 in 07/2015.   Glaucoma    Resolved with laser   HFrEF    a. EF 25% by cath in 2013; b. Echo in 07/2015 showing EF of 15-20%, moderate MR, moderate Pulm HTN, severely dilated IVC; c. 11/2016 Echo: EF 55-60%, no rwma, Gr1 DD, mildly dil LA, nl RV fxn. d. 12/2019 Echo: EF 40-45%, e. 07/2020 Echo: EF 20-25%, sev red RV fxx; f. 02/2021 Echo: EF 40-45%, glob HK/signif sept HK. Nl RV fxn.   History of kidney stones    Hypertension    Ischemic cardiomyopathy    Kidney stones    Left   Myocardial infarction [facility])    Paroxysmal atrial fibrillation ([place])    a. Dx 07/2015; b. CHA2DS2VASc = 5-->eliquis. Rhythm control w/ amiodarone.   Pollen allergies 09/21/2015   pt called and stated that he woke up and had some drainage-pt states he did not see the color of the drainage-pt denies running a fever and this only  happened once-pt instructed to call Dr Audree Bane office if he starts running a fever or if the color of drainage becomes yellow/green   Psoriasis    Pulmonary hypertension ([place])    Sleep apnea    Past Surgical History:  Procedure Laterality Date   CARDIAC CATHETERIZATION N/A 07/25/2015   Procedure: Right/Left Heart Cath and Coronary Angiography;  Surgeon: Wellington Hampshire, MD;  Location: [place] [facility];  Service: Cardiovascular;  Laterality: N/A;   [place], [place]   CORONARY ANGIOPLASTY WITH STENT PLACEMENT     CYSTOSCOPY WITH STENT PLACEMENT Left 09/07/2015   Procedure: CYSTOSCOPY WITH STENT PLACEMENT;  Surgeon: Hollice Espy, MD;  Location: [facility];  Service: Urology;  Laterality: Left;   CYSTOSCOPY/URETEROSCOPY/HOLMIUM LASER/STENT PLACEMENT Left 10/25/2015   Procedure: CYSTOSCOPY/URETEROSCOPY/HOLMIUM LASER/STENT EXCHANGE;  Surgeon: Hollice Espy, MD;  Location: [facility];  Service: Urology;  Laterality: Left;   CYSTOSCOPY/URETEROSCOPY/HOLMIUM LASER/STENT PLACEMENT Left 05/11/2019   Procedure: CYSTOSCOPY/URETEROSCOPY/HOLMIUM LASER/STENT PLACEMENT;  Surgeon: Hollice Espy, MD;  Location: [facility];  Service: Urology;  Laterality: Left;   EXTRACORPOREAL SHOCK WAVE LITHOTRIPSY Left 04/02/2019   Procedure: EXTRACORPOREAL SHOCK WAVE LITHOTRIPSY (ESWL);  Surgeon: Hollice Espy, MD;  Location: [facility];  Service: Urology;  Laterality: Left;   EXTRACORPOREAL SHOCK  WAVE LITHOTRIPSY Left 03/05/2019   Procedure: EXTRACORPOREAL SHOCK WAVE LITHOTRIPSY (ESWL);  Surgeon: Abbie Sons, MD;  Location: [facility];  Service: Urology;  Laterality: Left;   EYE SURGERY Bilateral    LASER FOR GLAUCOMA   IRRIGATION AND DEBRIDEMENT FOOT Left 09/30/2020   Procedure: IRRIGATION AND DEBRIDEMENT FOOT;  Surgeon: Criselda Peaches, DPM;  Location: [facility];  Service: Podiatry;  Laterality: Left;   KIDNEY SURGERY     LOWER EXTREMITY  ANGIOGRAPHY Left 10/04/2020   Procedure: Lower Extremity Angiography;  Surgeon: Katha Cabal, MD;  Location: [place] [facility];  Service: Cardiovascular;  Laterality: Left;   NEPHROSTOMY TUBE PLACEMENT ([place] [facility]) Left 11/2019   RIGHT/LEFT HEART CATH AND CORONARY ANGIOGRAPHY N/A 07/12/2020   Procedure: RIGHT/LEFT HEART CATH AND CORONARY ANGIOGRAPHY;  Surgeon: Nelva Bush, MD;  Location: [place] [facility];  Service: Cardiovascular;  Laterality: N/A;   ROBOT ASSISTED LAPAROSCOPIC NEPHRECTOMY Left 12/10/2019   Procedure: XI ROBOTIC ASSISTED LAPAROSCOPIC NEPHRECTOMY;  Surgeon: Hollice Espy, MD;  Location: [facility];  Service: Urology;  Laterality: Left;   URETEROSCOPY WITH HOLMIUM LASER LITHOTRIPSY Left 09/07/2015   Procedure: URETEROSCOPY WITH HOLMIUM LASER LITHOTRIPSY;  Surgeon: Hollice Espy, MD;  Location: [facility];  Service: Urology;  Laterality: Left;    Allergies  Allergies  Allergen Reactions   Other Hives and Other (See Comments)    Blue cheese Blue cheese Blue cheese    History of Present Illness    72 year old male with above past medical history including chronic HFrEF, ischemic cardiomyopathy, CAD status post remote PCI to the proximal LAD, pulmonary hypertension, paroxysmal atrial fibrillation, stage III chronic kidney disease, left nephrectomy, hypertension, hyperlipidemia, diabetes, and sleep apnea.  Atrial fibrillation was diagnosed in November 2017, and he has been managed with amiodarone and Eliquis.  History of cardiomyopathy dates back to at least 2013, at which time EF was 25%.  There were 2022, he had worsening lower extremity edema despite outpatient titration of torsemide dosing.  Echo showed severe biventricular failure with an EF of 20 to 25% and severely reduced RV function.  Right and left heart cardiac catheterization showed moderate, nonobstructive CAD, similar to study in 2017, as well as significantly elevated right heart pressures.  He was  admitted and diuresed and required de-escalation of diuretic therapy in the outpatient setting due to acute kidney injury.  Repeat echocardiogram in September 2022 showed slight improvement in EF to 40-45% with global hypokinesis and significant septal hypokinesis.  [name] was last seen in cardiology clinic in August 2023, at which time his weight was stable and he was taking torsemide 40 mg daily with an extra 20 mg about twice a week along with daily metolazone.  Over the past 6+ months, he has done well from a heart failure standpoint.  He no longer requires any additional torsemide in the afternoon.  His house mate was recently dx w/ stage IV pancreatic cancer and Mr. Borey has been grieving some.  He has noted a reduced appetite and his wt is down 16 lbs since last August (also on GLP1 agonist).  He denies chest pain, palpitations, dyspnea, pnd, orthopnea, n, v, dizziness, syncope, weight gain, or early satiety.   He does have mild lower ext edema at the end of the day, but this typically resolves w/ leg elevation.  Home Medications    Current Outpatient Medications  Medication Sig Dispense Refill   amiodarone (PACERONE) 200 MG tablet TAKE 1 TABLET BY MOUTH ONCE DAILY. 30 tablet  4   apixaban (ELIQUIS) 5 MG TABS tablet TAKE ONE TABLET BY MOUTH TWICE DAILY 60 tablet 2   Bromelains (BROMELAIN PO) Take 2 capsules by mouth daily.     CINNAMON PO Take 1,200 mg by mouth in the morning and at bedtime. [place] Cinnamon 1200mg      Coenzyme Q10 (COQ10) 400 MG CAPS Take 400 mg by mouth 2 (two) times daily.     docusate sodium (COLACE) 100 MG capsule 2 (two) times daily. Take 300 mg in the pm & 200 mg in the am.     glipiZIDE (GLUCOTROL) 10 MG tablet TAKE ONE TABLET BY MOUTH TWICE DAILY (Patient taking differently: Take 20 mg by mouth once.) 60 tablet 2   HAWTHORNE BERRY PO Take 565 mg by mouth at bedtime.     Insulin Glargine (TOUJEO SOLOSTAR Elsie) Inject 100 Units into the skin as needed.     JARDIANCE 10  MG TABS tablet Take 10 mg by mouth every evening.   6   ketoconazole (NIZORAL) 2 % shampoo Apply 1 Application topically 2 (two) times a week. 120 mL 3   levothyroxine (SYNTHROID) 50 MCG tablet Take 50 mcg by mouth daily before breakfast.     losartan (COZAAR) 25 MG tablet Take 0.5 tablets (12.5 mg total) by mouth daily. 30 tablet 6   magnesium oxide (MAG-OX) 400 (241.3 Mg) MG tablet Take 1 tablet (400 mg total) by mouth daily. 30 tablet 6   MELATONIN-THEANINE PO Take 2 capsules by mouth at bedtime. Ol[place] Sleep Gummies     metolazone (ZAROXOLYN) 2.5 MG tablet TAKE ONE TABLET BY MOUTH EVERY MORNING take along with torsemide 30 tablet 2   mometasone (ELOCON) 0.1 % cream Apply 1 Application topically daily. To areas of psoriasis 45 g 11   Multiple Vitamins-Minerals (PRESERVISION AREDS 2 PO) Take 1 tablet by mouth in the morning and at bedtime.     potassium chloride SA (KLOR-CON M) 20 MEQ tablet TAKE TWO TABLETS BY MOUTH THREE TIMES DAILY (Patient taking differently: Take 60 mEq by mouth 3 (three) times daily.) 180 tablet 0   rosuvastatin (CRESTOR) 20 MG tablet TAKE 1 TABLET BY MOUTH ONCE DAILY. 90 tablet 1   tirzepatide (MOUNJARO) 12.5 MG/0.5ML Pen Inject into the skin once a week.     torsemide (DEMADEX) 20 MG tablet TAKE (2) TABLETS BY MOUTH TWICE DAILY. 90 tablet 1   Torsemide 40 MG TABS Take 40 mg by mouth as directed. Take 40 mg once daily with extra 20 mg as needed for weight greater than 275 60 tablet 3   traZODone (DESYREL) 50 MG tablet Take 1 tablet (50 mg total) by mouth at bedtime. 30 tablet 3   valACYclovir (VALTREX) 1000 MG tablet Take 1,000 mg by mouth daily as needed.     vitamin C (ASCORBIC ACID) 500 MG tablet Take 500 mg by mouth at bedtime.     zinc gluconate 50 MG tablet Take 50 mg by mouth daily.     carvedilol (COREG) 12.5 MG tablet TAKE (1) TABLET TWICE A DAY WITH FOOD---BREAKFAST AND SUPPER. 180 tablet 1   magnesium oxide (MAG-OX) 400 (240 Mg) MG tablet TAKE ONE TABLET BY MOUTH  ONCE DAILY AS NEEDED (Patient not taking: Reported on 08/27/2022) 30 tablet 2   No current facility-administered medications for this visit.     Review of Systems    Mild lower extremity dependent edema.  He denies chest pain, palpitations, dyspnea, pnd, orthopnea, n, v, dizziness, syncope, weight gain, or  early satiety.  All other systems reviewed and are otherwise negative except as noted above.    Physical Exam    VS:  BP 100/62 (BP Location: Left Arm, Patient Position: Sitting, Cuff Size: Normal)   Pulse 84   Ht 6' (1.829 m)   Wt 259 lb 9.6 oz (117.8 kg)   SpO2 96%   BMI 35.21 kg/m  , BMI Body mass index is 35.21 kg/m.     GEN: Well nourished, well developed, in no acute distress. HEENT: normal. Neck: Supple, no JVD, carotid bruits, or masses. Cardiac: RRR, no murmurs, rubs, or gallops. No clubbing, cyanosis, trace bilat lower ext woody edema.  Radials 2+/PT 2+ and equal bilaterally.  Respiratory:  Respirations regular and unlabored, clear to auscultation bilaterally. GI: Soft, nontender, nondistended, BS + x 4. MS: no deformity or atrophy. Skin: warm and dry, no rash. Neuro:  Strength and sensation are intact. Psych: Normal affect.  Accessory Clinical Findings    ECG personally reviewed by me today - Sinus rhythm w/ sinus arrhythmia, 84, 1st deg AVB, RBBB, LAD - no acute changes.  Lab Results  Component Value Date   WBC 5.7 10/04/2020   HGB 10.8 (L) 10/04/2020   HCT 32.6 (L) 10/04/2020   MCV 95.9 10/04/2020   PLT 166 10/04/2020   Lab Results  Component Value Date   ALT 25 03/02/2022   AST 26 03/02/2022   ALKPHOS 76 03/02/2022   BILITOT 0.8 03/02/2022   Lab Results  Component Value Date   CHOL 107 03/02/2022   HDL 30 (L) 03/02/2022   LDLCALC 0 03/02/2022   TRIG 386 (H) 03/02/2022   CHOLHDL 3.6 03/02/2022    Labs from care everywhere dated July 27, 2022:  Sodium 136, potassium 3.2, chloride 94, CO2 33.2, BUN 44, creatinine 2.4, glucose 183  A1c 6.3   _____________   Assessment & Plan    1.  Chronic HF w/ midrange EF/ICM: EF previously as low as 15 to 20% with subsequent proven to 40-45% on most recent echo in September 2022.  He has done well over the past 6+ months and has lost roughly 16 pounds on GLP-1 agonist.  In the setting, he has not been having any significant lower extremity swelling and he is euvolemic on examination today.  He has been taking torsemide 40 mg once a day as well as metolazone 2.5 mg daily, and has not required any additional torsemide in the afternoons.  He recently had lab work on February 23, showing stable renal function with a creatinine of 2.4.  His potassium was low at 3.2 and we will arrange for follow-up.  He remains on beta-blocker, ARB, SGLT2 inhibitor, and GLP-1 agonist.  Blood pressure remains soft not setting, I do not think he'd tolerate transitioning to [facility] or addition of MRA (also CKD).  2.  Coronary artery disease: Status post prior bare-metal stenting of the proximal LAD in 1998 with diagnostic catheterization in 2022 showing moderate, nonobstructive CAD.  He has not been experiencing chest pain and dyspnea is stable to improved.  He remains on beta-blocker and statin therapy.  No aspirin in setting of Eliquis.  3.  Essential hypertension: Blood pressure is chronically soft and remains so today at 100/62.  No changes to medications today.  4.  Hyperlipidemia: Remains on statin therapy with total cholesterol 107 in September.  5.  DMII:  A1c 6.3.  Managed by PCP.  On jardiance, mounjaro, glipizide, ARB, and statin.  6.  Paroxysmal atrial  fibrillation: Maintaining sinus rhythm with first-degree block on amiodarone and carvedilol.  [name] anticoagulated with Eliquis.  Arranging for follow-up complete metabolic panel, TSH, and CBC.  7.  Stage III chronic kidney disease/solitary kidney: Creatinine relatively stable at 2.4 in February.  He will have a follow-up complete metabolic panel in the setting  of hypokalemia, in the next week or so.  8.  Morbid obesity:  Continues to lose weight on GLP-1 agonist.  Also experiencing some grief and reduced appetite in the setting of his housemate's illness.  9.  Hypokalemia:  K 3.2 on eval in Feb.  Repeat.  10.  Disposition:  F/u cbc, cmet, TSH. F/u in clinic in 6 mos or sooner if necessary.  Murray Hodgkins, NP 08/29/2022, 3:51 PM

## 2022-08-30 NOTE — Progress Notes (Signed)
  Subjective:  Patient ID: [name], male    DOB: September 14, 1950,  MRN: [id number]    72 y.o. male returns for at risk diabetic foot care overall doing well.  Nails are thickened elongated once again.  Blood sugar continues to go up and down  Review of Systems: Negative except as noted in the HPI. Denies N/V/F/Ch.   Objective:   Vitals:   08/27/22 1553  BP: (!) 98/58  Pulse: 87   There is no height or weight on file to calculate BMI. Constitutional Well developed. Well nourished.  Vascular Foot warm and well perfused. Capillary refill normal to all digits.   Neurologic Normal speech. Oriented to person, place, and time. Epicritic sensation to light touch grossly reduced bilaterally.  Dermatologic No recurrence of wound or ulceration.  He has thickened elongated toenails x10 bilaterally with subungual brown and yellow discoloration.   Orthopedic: Good smooth range of motion of foot joints    Assessment:   1. Pain due to onychomycosis of toenails of both feet   2. DM (diabetes mellitus), type 2 with complications [facility])      Plan:  Patient was evaluated and treated and all questions answered.   Patient educated on diabetes. Discussed proper diabetic foot care and discussed risks and complications of disease. Educated patient in depth on reasons to return to the office immediately should he/she discover anything concerning or new on the feet. All questions answered. Discussed proper shoes as well.   Discussed the etiology and treatment options for the condition in detail with the patient.  Recommended debridement of the nails today. Sharp and mechanical debridement performed of all painful and mycotic nails today. Nails debrided in length and thickness using a nail nipper to level of comfort. Discussed treatment options including appropriate shoe gear. Follow up as needed for painful nails.         Return in about 10 weeks (around 11/05/2022) for at risk diabetic foot  care.

## 2022-09-01 DIAGNOSIS — E119 Type 2 diabetes mellitus without complications: Secondary | ICD-10-CM | POA: Diagnosis not present

## 2022-09-01 DIAGNOSIS — Z794 Long term (current) use of insulin: Secondary | ICD-10-CM | POA: Diagnosis not present

## 2022-09-03 ENCOUNTER — Ambulatory Visit: Payer: 59 | Admitting: Urology

## 2022-09-07 ENCOUNTER — Ambulatory Visit
Admission: RE | Admit: 2022-09-07 | Discharge: 2022-09-07 | Disposition: A | Discharge status: Home / Self Care | Payer: 59 | Attending: Urology | Admitting: Urology

## 2022-09-07 ENCOUNTER — Other Ambulatory Visit
Admission: RE | Admit: 2022-09-07 | Discharge: 2022-09-07 | Disposition: A | Discharge status: Home / Self Care | Payer: 59 | Source: Home / Self Care | Attending: Urology | Admitting: Urology

## 2022-09-07 ENCOUNTER — Ambulatory Visit (INDEPENDENT_AMBULATORY_CARE_PROVIDER_SITE_OTHER): Payer: 59 | Admitting: Urology

## 2022-09-07 ENCOUNTER — Other Ambulatory Visit
Admission: RE | Admit: 2022-09-07 | Discharge: 2022-09-07 | Disposition: A | Discharge status: Home / Self Care | Payer: 59 | Source: Home / Self Care | Attending: Nurse Practitioner | Admitting: Nurse Practitioner

## 2022-09-07 ENCOUNTER — Ambulatory Visit
Admission: RE | Admit: 2022-09-07 | Discharge: 2022-09-07 | Disposition: A | Discharge status: Home / Self Care | Payer: 59 | Source: Ambulatory Visit | Attending: Urology | Admitting: Urology

## 2022-09-07 ENCOUNTER — Other Ambulatory Visit: Payer: Self-pay

## 2022-09-07 VITALS — BP 100/61 | HR 86 | Ht 72.0 in | Wt 257.4 lb

## 2022-09-07 DIAGNOSIS — I480 Paroxysmal atrial fibrillation: Secondary | ICD-10-CM | POA: Insufficient documentation

## 2022-09-07 DIAGNOSIS — I5032 Chronic diastolic (congestive) heart failure: Secondary | ICD-10-CM | POA: Insufficient documentation

## 2022-09-07 DIAGNOSIS — Z87442 Personal history of urinary calculi: Secondary | ICD-10-CM

## 2022-09-07 DIAGNOSIS — K5900 Constipation, unspecified: Secondary | ICD-10-CM | POA: Diagnosis not present

## 2022-09-07 DIAGNOSIS — R109 Unspecified abdominal pain: Secondary | ICD-10-CM | POA: Insufficient documentation

## 2022-09-07 DIAGNOSIS — Z79899 Other long term (current) drug therapy: Secondary | ICD-10-CM | POA: Insufficient documentation

## 2022-09-07 LAB — URINALYSIS, COMPLETE (UACMP) WITH MICROSCOPIC
Bacteria, UA: NONE SEEN
Bilirubin Urine: NEGATIVE
Glucose, UA: 250 mg/dL — AB
Hgb urine dipstick: NEGATIVE
Ketones, ur: NEGATIVE mg/dL
Nitrite: NEGATIVE
Protein, ur: NEGATIVE mg/dL
Specific Gravity, Urine: 1.015 (ref 1.005–1.030)
pH: 7 (ref 5.0–8.0)

## 2022-09-07 LAB — CBC
HCT: 36.1 % — ABNORMAL LOW (ref 39.0–52.0)
Hemoglobin: 12.6 g/dL — ABNORMAL LOW (ref 13.0–17.0)
MCH: 35.3 pg — ABNORMAL HIGH (ref 26.0–34.0)
MCHC: 34.9 g/dL (ref 30.0–36.0)
MCV: 101.1 fL — ABNORMAL HIGH (ref 80.0–100.0)
Platelets: 156 10*3/uL (ref 150–400)
RBC: 3.57 MIL/uL — ABNORMAL LOW (ref 4.22–5.81)
RDW: 16.3 % — ABNORMAL HIGH (ref 11.5–15.5)
WBC: 8.4 10*3/uL (ref 4.0–10.5)
nRBC: 0 % (ref 0.0–0.2)

## 2022-09-07 LAB — TSH: TSH: 0.555 u[IU]/mL (ref 0.350–4.500)

## 2022-09-07 LAB — COMPREHENSIVE METABOLIC PANEL
ALT: 24 U/L (ref 0–44)
AST: 26 U/L (ref 15–41)
Albumin: 3.9 g/dL (ref 3.5–5.0)
Alkaline Phosphatase: 61 U/L (ref 38–126)
Anion gap: 14 (ref 5–15)
BUN: 37 mg/dL — ABNORMAL HIGH (ref 8–23)
CO2: 29 mmol/L (ref 22–32)
Calcium: 9 mg/dL (ref 8.9–10.3)
Chloride: 92 mmol/L — ABNORMAL LOW (ref 98–111)
Creatinine, Ser: 2.13 mg/dL — ABNORMAL HIGH (ref 0.61–1.24)
GFR, Estimated: 33 mL/min — ABNORMAL LOW (ref >60)
Glucose, Bld: 164 mg/dL — ABNORMAL HIGH (ref 70–99)
Potassium: 3.7 mmol/L (ref 3.5–5.1)
Sodium: 135 mmol/L (ref 135–145)
Total Bilirubin: 0.6 mg/dL (ref 0.3–1.2)
Total Protein: 7.4 g/dL (ref 6.5–8.1)

## 2022-09-07 NOTE — Progress Notes (Signed)
Marcelle Overlie Plume,acting as a scribe for Vanna Scotland, MD.,have documented all relevant documentation on the behalf of Vanna Scotland, MD,as directed by  Vanna Scotland, MD while in the presence of Vanna Scotland, MD.  09/07/2022 10:14 AM   [name] [facility] [name], [name] [phone]  Referring provider: Margarita Mail, DO [address] [place],  [place] [address]  Chief Complaint  Patient presents with   Flank Pain    HPI: 72 year old male with a complex history of nephrolithiasis and a left XGP kidney status post nephrectomy in 2021 who returns today with concerns of potentially having another kidney stone. He has not been seen since 2022.   He reports experiencing lower right back flank pain, reminiscent of his first kidney stone episode, which was on the right side.     His most recent creatinine level was 2.13 on 09/07/2022 , with additional labs showing a normal CBC today. His urinalysis is also negative.    Today, he reports experiencing lower right back flank pain, reminiscent of his first kidney stone episode, which was on the right side. Subsequent stones have been primarily on his left side. Since scheduling the appointment, he notes the pain has lessened, suggesting it might not be a kidney stone.    Results for orders placed or performed during the hospital encounter of Love/05/24  CBC  Result Value Ref Range   WBC 8.4 4.0 - 10.5 K/uL   RBC 3.57 (L) 4.22 - 5.81 MIL/uL   Hemoglobin 12.6 (L) 13.0 - 17.0 g/dL   HCT 50.5 (L) 69.7 - 94.8 %   MCV 101.1 (H) 80.0 - 100.0 fL   MCH 35.3 (H) 26.0 - 34.0 pg   MCHC 34.9 30.0 - 36.0 g/dL   RDW 01.6 (H) 55.3 - 74.8 %   Platelets 156 150 - 400 K/uL   nRBC 0.0 0.0 - 0.2 %  Comprehensive metabolic panel  Result Value Ref Range   Sodium 135 135 - 145 mmol/L   Potassium 3.7 3.5 - 5.1 mmol/L   Chloride 92 (L) 98 - 111 mmol/L   CO2 29 22 - 32 mmol/L   Glucose, Bld 164 (H) 70 - 99 mg/dL   BUN 37 (H) 8 - 23 mg/dL    Creatinine, Ser 2.70 (H) 0.61 - 1.24 mg/dL   Calcium 9.0 8.9 - 78.6 mg/dL   Total Protein 7.4 6.5 - 8.1 g/dL   Albumin 3.9 3.5 - 5.0 g/dL   AST 26 15 - 41 U/L   ALT 24 0 - 44 U/L   Alkaline Phosphatase 61 38 - 126 U/L   Total Bilirubin 0.6 0.3 - 1.2 mg/dL   GFR, Estimated 33 (L) >60 mL/min   Anion gap 14 5 - 15  Results for orders placed or performed during the hospital encounter of Love/05/24  Urinalysis, Complete w Microscopic -  Result Value Ref Range   Color, Urine YELLOW YELLOW   APPearance CLEAR CLEAR   Specific Gravity, Urine 1.015 1.005 - 1.030   pH 7.0 5.0 - 8.0   Glucose, UA 250 (A) NEGATIVE mg/dL   Hgb urine dipstick NEGATIVE NEGATIVE   Bilirubin Urine NEGATIVE NEGATIVE   Ketones, ur NEGATIVE NEGATIVE mg/dL   Protein, ur NEGATIVE NEGATIVE mg/dL   Nitrite NEGATIVE NEGATIVE   Leukocytes,Ua TRACE (A) NEGATIVE   Squamous Epithelial / HPF 0-5 0 - 5 /HPF   WBC, UA 0-5 0 - 5 WBC/hpf   RBC / HPF 0-5 0 - 5 RBC/hpf  Bacteria, UA NONE SEEN NONE SEEN      PMH: Past Medical History:  Diagnosis Date   Allergy 520 112 [id number]   Cataract    early stage   CHF (congestive heart failure) (HCC)    CKD (chronic kidney disease) stage 3, GFR 30-59 ml/min (HCC)    Coronary artery disease    a. 1998 s/p ACS [place] b. 07/2015 Cath: LM nl, LAD patent stent, LCX min irregs, OM1/2 min irregs, OM3 nl, RCA min irregs; c. 07/2020 Cath: LM nl, LAD 50p/m, 70d, D2 70, LCX 40p/5332m, OM3 40, RCA 60d->Med Rx.   Depression    Intermiteant   Diabetes mellitus type 2, uncontrolled    a. A1c 11.0 in 07/2015.   Glaucoma    Resolved with laser   HFrEF    a. EF 25% by cath in 2013; b. Echo in 07/2015 showing EF of 15-20%, moderate MR, moderate Pulm HTN, severely dilated IVC; c. 11/2016 Echo: EF 55-60%, no rwma, Gr1 DD, mildly dil LA, nl RV fxn. d. 12/2019 Echo: EF 40-45%, e. 07/2020 Echo: EF 20-25%, sev red RV fxx; f. 02/2021 Echo: EF 40-45%, glob HK/signif sept HK. Nl RV  fxn.   History of kidney stones    Hypertension    Ischemic cardiomyopathy    Kidney stones    Left   Myocardial infarction [facility](HCC)    Paroxysmal atrial fibrillation (HCC)    a. Dx 07/2015; b. CHA2DS2VASc = 5-->eliquis. Rhythm control w/ amiodarone.   Pollen allergies Love/19/2017   pt called and stated that he woke up and had some drainage-pt states he did not see the color of the drainage-pt denies running a fever and this only happened once-pt instructed to call Dr Assunta GamblesBrandons office if he starts running a fever or if the color of drainage becomes yellow/green   Psoriasis    Pulmonary hypertension (HCC)    Sleep apnea     Surgical History: Past Surgical History:  Procedure Laterality Date   CARDIAC CATHETERIZATION N/A 07/25/2015   Procedure: Right/Left Heart Cath and Coronary Angiography;  Surgeon: Iran OuchMuhammad A Arida, MD;  Location: [facility] INV[facility]IVE [facility];  Service: Cardiovascular;  Laterality: N/A;   CARDIAC [place]     [place]   [place]   CORONARY ANGIOPLASTY WITH STENT PLACEMENT     CYSTOSCOPY WITH STENT PLACEMENT Left Love/10/2015   Procedure: CYSTOSCOPY WITH STENT PLACEMENT;  Surgeon: Vanna ScotlandAshley Keilee Denman, MD;  Location: [facility] ORS;  Service: Urology;  Laterality: Left;   CYSTOSCOPY/URETEROSCOPY/HOLMIUM LASER/STENT PLACEMENT Left 10/25/2015   Procedure: CYSTOSCOPY/URETEROSCOPY/HOLMIUM LASER/STENT EXCHANGE;  Surgeon: Vanna ScotlandAshley Dmiya Malphrus, MD;  Location: [facility];  Service: Urology;  Laterality: Left;   CYSTOSCOPY/URETEROSCOPY/HOLMIUM LASER/STENT PLACEMENT Left 05/11/2019   Procedure: CYSTOSCOPY/URETEROSCOPY/HOLMIUM LASER/STENT PLACEMENT;  Surgeon: Vanna ScotlandBrandon, Opal Dinning, MD;  Location: [facility];  Service: Urology;  Laterality: Left;   EXTRACORPOREAL SHOCK WAVE LITHOTRIPSY Left 04/02/2019   Procedure: EXTRACORPOREAL SHOCK WAVE LITHOTRIPSY (ESWL);  Surgeon: Vanna ScotlandBrandon, Kasra Melvin, MD;  Location: [facility];  Service: Urology;  Laterality: Left;   EXTRACORPOREAL SHOCK  WAVE LITHOTRIPSY Left 03/05/2019   Procedure: EXTRACORPOREAL SHOCK WAVE LITHOTRIPSY (ESWL);  Surgeon: Riki AltesStoioff, Scott C, MD;  Location: [facility];  Service: Urology;  Laterality: Left;   EYE SURGERY Bilateral    LASER FOR GLAUCOMA   IRRIGATION AND DEBRIDEMENT FOOT Left Love/29/2022   Procedure: IRRIGATION AND DEBRIDEMENT FOOT;  Surgeon: Edwin CapMcDonald, Adam R, DPM;  Location: [facility];  Service: Podiatry;  Laterality: Left;  KIDNEY SURGERY     LOWER EXTREMITY ANGIOGRAPHY Left 10/04/2020   Procedure: Lower Extremity Angiography;  Surgeon: Renford Dills, MD;  Location: [facility];  Service: Cardiovascular;  Laterality: Left;   NEPHROSTOMY TUBE PLACEMENT ([facility] HX) Left 11/2019   RIGHT/LEFT HEART CATH AND CORONARY ANGIOGRAPHY N/A 07/12/2020   Procedure: RIGHT/LEFT HEART CATH AND CORONARY ANGIOGRAPHY;  Surgeon: Yvonne Kendall, MD;  Location: [facility];  Service: Cardiovascular;  Laterality: N/A;   ROBOT ASSISTED LAPAROSCOPIC NEPHRECTOMY Left 12/10/2019   Procedure: XI ROBOTIC ASSISTED LAPAROSCOPIC NEPHRECTOMY;  Surgeon: Vanna Scotland, MD;  Location: [facility];  Service: Urology;  Laterality: Left;   URETEROSCOPY WITH HOLMIUM LASER LITHOTRIPSY Left Love/10/2015   Procedure: URETEROSCOPY WITH HOLMIUM LASER LITHOTRIPSY;  Surgeon: Vanna Scotland, MD;  Location: [facility];  Service: Urology;  Laterality: Left;    Home Medications:  Allergies as of 09/07/2022       Reactions   Other Hives, Other (See Comments)   Blue cheese Blue cheese Blue cheese        Medication List        Accurate as of September 07, 2022 10:14 AM. If you have any questions, ask your nurse or doctor.          amiodarone 200 MG tablet Commonly known as: PACERONE TAKE 1 TABLET BY MOUTH ONCE DAILY.   ascorbic acid 500 MG tablet Commonly known as: VITAMIN C Take 500 mg by mouth at bedtime.   BROMELAIN PO Take 2 capsules by mouth daily.   carvedilol 12.5 MG tablet Commonly known as: COREG TAKE (1)  TABLET TWICE A DAY WITH FOOD---BREAKFAST AND SUPPER.   CINNAMON PO Take 1,200 mg by mouth in the morning and at bedtime. [place] Cinnamon    CoQ10 400 MG Caps Take 400 mg by mouth 2 (two) times daily.   docusate sodium 100 MG capsule Commonly known as: COLACE 2 (two) times daily. Take 300 mg in the pm & 200 mg in the am.   Eliquis 5 MG Tabs tablet Generic drug: apixaban TAKE ONE TABLET BY MOUTH TWICE DAILY   glipiZIDE 10 MG tablet Commonly known as: GLUCOTROL TAKE ONE TABLET BY MOUTH TWICE DAILY What changed:  how much to take when to take this   HAWTHORNE BERRY PO Take 565 mg by mouth at bedtime.   Jardiance 10 MG Tabs tablet Generic drug: empagliflozin Take 10 mg by mouth every evening.   ketoconazole 2 % shampoo Commonly known as: NIZORAL Apply 1 Application topically 2 (two) times a week.   levothyroxine 50 MCG tablet Commonly known as: SYNTHROID Take 50 mcg by mouth daily before breakfast.   losartan 25 MG tablet Commonly known as: COZAAR Take 0.5 tablets (12.5 mg total) by mouth daily.   magnesium oxide 400 (241.3 Mg) MG tablet Commonly known as: MAG-OX Take 1 tablet (400 mg total) by mouth daily.   magnesium oxide 400 (240 Mg) MG tablet Commonly known as: MAG-OX TAKE ONE TABLET BY MOUTH ONCE DAILY AS NEEDED   MELATONIN-THEANINE PO Take 2 capsules by mouth at bedtime. Olly Sleep Gummies   metolazone 2.5 MG tablet Commonly known as: ZAROXOLYN TAKE ONE TABLET BY MOUTH EVERY MORNING take along with torsemide   mometasone 0.1 % cream Commonly known as: ELOCON Apply 1 Application topically daily. To areas of psoriasis   potassium chloride SA 20 MEQ tablet Commonly known as: KLOR-CON M TAKE TWO TABLETS BY MOUTH THREE TIMES DAILY What changed: how much to take  PRESERVISION AREDS 2 PO Take 1 tablet by mouth in the morning and at bedtime.   rosuvastatin 20 MG tablet Commonly known as: CRESTOR TAKE 1 TABLET BY MOUTH ONCE DAILY.   tirzepatide  12.5 MG/0.5ML Pen Commonly known as: MOUNJARO Inject into the skin once a week.   Torsemide 40 MG Tabs Take 40 mg by mouth as directed. Take 40 mg once daily with extra 20 mg as needed for weight greater than 275   torsemide 20 MG tablet Commonly known as: DEMADEX TAKE (2) TABLETS BY MOUTH TWICE DAILY.   TOUJEO SOLOSTAR [place] Inject 100 Units into the skin as needed.   traZODone 50 MG tablet Commonly known as: DESYREL Take 1 tablet (50 mg total) by mouth at bedtime.   valACYclovir 1000 MG tablet Commonly known as: VALTREX Take 1,000 mg by mouth daily as needed.   zinc gluconate 50 MG tablet Take 50 mg by mouth daily.        Allergies:  Allergies  Allergen Reactions   Other Hives and Other (See Comments)    Blue cheese Blue cheese Blue cheese    Family History: Family History  Problem Relation Age of Onset   Heart failure Father    Arthritis Father    Heart disease Father    Hypertension Father    Heart attack Brother    Diabetes Brother    Heart disease Brother    Hypertension Brother    Birth defects Sister    Early death Sister    Early death Brother    Heart disease Brother    Hypertension Brother    Kidney cancer Neg Hx    Bladder Cancer Neg Hx    Prostate cancer Neg Hx     Social History:  reports that he has never smoked. He has never used smokeless tobacco. He reports current alcohol use. He reports current drug use. Drugs: Cocaine, LSD, Marijuana, and Opium.   Physical Exam: BP 100/61   Pulse 86   Ht 6' (1.829 m)   Wt 257 lb 6 oz (116.7 kg)   BMI 34.91 kg/m   Constitutional:  Alert and oriented, No acute distress. HEENT:  AT, moist mucus membranes.  Trachea midline, no masses. Neurologic: Grossly intact, no focal deficits, moving all 4 extremities. Psychiatric: Normal mood and affect.  Assessment & Plan:    1. RIght flank pain/ History of kidney stones - His pain is improved and is unlikely to be stone related. -Negative  urinalysis - Creatinine is stable and reassuring - KUB for reassurance   Return if symptoms worsen or fail to improve.  I have reviewed the above documentation for accuracy and completeness, and I agree with the above.   Vanna Scotland, MD   [facility] [address] [place], [place] [address] [phone]

## 2022-09-07 NOTE — Addendum Note (Signed)
Addended by: Calli Bashor V on: 09/07/2022 08:12 AM   Modules accepted: Orders  

## 2022-09-13 ENCOUNTER — Other Ambulatory Visit: Payer: Self-pay | Admitting: Internal Medicine

## 2022-09-13 NOTE — Telephone Encounter (Signed)
Requested medications are due for refill today.  unsure  Requested medications are on the active medications list.  yes  Last refill. 08/24/2022 #90 1 rf  Future visit scheduled.   yes  Notes to clinic.  Pt has Rx for both 20 and 40 mg. Sig is unclear. Please review    Requested Prescriptions  Pending Prescriptions Disp Refills   torsemide (DEMADEX) 20 MG tablet [Pharmacy Med Name: torsemide 20 mg tablet] 90 tablet 1    Sig: TAKE TWO TABLETS BY MOUTH TWICE DAILY     Cardiovascular:  Diuretics - Loop Failed - 09/13/2022  8:04 AM      Failed - Cr in normal range and within 180 days    Creatinine, Ser  Date Value Ref Range Status  09/07/2022 2.13 (H) 0.61 - 1.24 mg/dL Final         Failed - Cl in normal range and within 180 days    Chloride  Date Value Ref Range Status  09/07/2022 92 (L) 98 - 111 mmol/L Final         Failed - Mg Level in normal range and within 180 days    Magnesium  Date Value Ref Range Status  10/01/2020 2.3 1.7 - 2.4 mg/dL Final    Comment:    Performed at [facility], [address] [place], [place] [address]         Passed - K in normal range and within 180 days    Potassium  Date Value Ref Range Status  09/07/2022 3.7 3.5 - 5.1 mmol/L Final         Passed - Ca in normal range and within 180 days    Calcium  Date Value Ref Range Status  09/07/2022 9.0 8.9 - 10.3 mg/dL Final         Passed - Na in normal range and within 180 days    Sodium  Date Value Ref Range Status  09/07/2022 135 135 - 145 mmol/L Final  01/11/2021 137 134 - 144 mmol/L Final         Passed - Last BP in normal range    BP Readings from Last 1 Encounters:  09/07/22 100/61         Passed - Valid encounter within last 6 months    Recent Outpatient Visits           1 month ago Other insomnia   [facility] Margarita Mail, DO   1 month ago Hypertension, unspecified type   [facility] Margarita Mail, DO       Future Appointments             In 2 weeks  [facility]   In 5 months Margarita Mail, DO [facility], [facility]   In 5 months End, Cristal Deer, MD [facility]

## 2022-09-14 ENCOUNTER — Other Ambulatory Visit: Payer: Self-pay | Admitting: Internal Medicine

## 2022-09-14 NOTE — Telephone Encounter (Signed)
Requested Prescriptions  Pending Prescriptions Disp Refills   potassium chloride SA (KLOR-CON M) 20 MEQ tablet [Pharmacy Med Name: potassium chloride ER 20 mEq tablet,extended release(part/cryst)] 270 tablet 2    Sig: Take 3 tablets (60 mEq total) by mouth 3 (three) times daily.     Endocrinology:  Minerals - Potassium Supplementation Failed - 09/14/2022 10:23 AM      Failed - Cr in normal range and within 360 days    Creatinine, Ser  Date Value Ref Range Status  09/07/2022 2.13 (H) 0.61 - 1.24 mg/dL Final         Passed - K in normal range and within 360 days    Potassium  Date Value Ref Range Status  09/07/2022 3.7 3.5 - 5.1 mmol/L Final         Passed - Valid encounter within last 12 months    Recent Outpatient Visits           1 month ago Other insomnia   [facility] Margarita Mail, DO   2 months ago Hypertension, unspecified type   [facility] Margarita Mail, DO       Future Appointments             In 1 week  [facility]   In 5 months Margarita Mail, DO [facility]   In 5 months End, Cristal Deer, MD [facility]

## 2022-09-27 ENCOUNTER — Ambulatory Visit (INDEPENDENT_AMBULATORY_CARE_PROVIDER_SITE_OTHER): Payer: 59

## 2022-09-27 VITALS — BP 132/70 | Ht 72.0 in | Wt 255.9 lb

## 2022-09-27 DIAGNOSIS — Z0000 Encounter for general adult medical examination without abnormal findings: Secondary | ICD-10-CM | POA: Diagnosis not present

## 2022-09-27 NOTE — Patient Instructions (Addendum)
[name] , Thank you for taking time to come for your Medicare Wellness Visit. I appreciate your ongoing commitment to your health goals. Please review the following plan we discussed and let me know if I can assist you in the future.   These are the goals we discussed:  Goals   None     This is a list of the screening recommended for you and due dates:  Health Maintenance  Topic Date Due   Eye exam for diabetics  Never done   Yearly kidney health urinalysis for diabetes  Never done   Hepatitis C Screening: USPSTF Recommendation to screen - Ages 8-79 yo.  Never done   Colon Cancer Screening  Never done   Pneumonia Vaccine (2 of 2 - PCV) 10/25/2016   Hemoglobin A1C  07/14/2021   COVID-19 Vaccine (6 - 2023-24 season) 02/02/2022   Zoster (Shingles) Vaccine (1 of 2) 10/14/2022*   Complete foot exam   10/17/2022   Flu Shot  01/03/2023   Yearly kidney function blood test for diabetes  09/07/2023   Medicare Annual Wellness Visit  09/27/2023   HPV Vaccine  Aged Out   DTaP/Tdap/Td vaccine  Discontinued  *Topic was postponed. The date shown is not the original due date.    Advanced directives: yes  Conditions/risks identified: low falls risk  Next appointment: Follow up in one year for your annual wellness visit. 10/03/2023  in person  Preventive Care 65 Years and Older, Male  Preventive care refers to lifestyle choices and visits with your health care provider that can promote health and wellness. What does preventive care include? A yearly physical exam. This is also called an annual well check. Dental exams once or twice a year. Routine eye exams. Ask your health care provider how often you should have your eyes checked. Personal lifestyle choices, including: Daily care of your teeth and gums. Regular physical activity. Eating a healthy diet. Avoiding tobacco and drug use. Limiting alcohol use. Practicing safe sex. Taking low doses of aspirin every day. Taking vitamin  and mineral supplements as recommended by your health care provider. What happens during an annual well check? The services and screenings done by your health care provider during your annual well check will depend on your age, overall health, lifestyle risk factors, and family history of disease. Counseling  Your health care provider may ask you questions about your: Alcohol use. Tobacco use. Drug use. Emotional well-being. Home and relationship well-being. Sexual activity. Eating habits. History of falls. Memory and ability to understand (cognition). Work and work Astronomer. Screening  You may have the following tests or measurements: Height, weight, and BMI. Blood pressure. Lipid and cholesterol levels. These may be checked every 5 years, or more frequently if you are over 57 years old. Skin check. Lung cancer screening. You may have this screening every year starting at age 62 if you have a 30-pack-year history of smoking and currently smoke or have quit within the past 15 years. Fecal occult blood test (FOBT) of the stool. You may have this test every year starting at age 55. Flexible sigmoidoscopy or colonoscopy. You may have a sigmoidoscopy every 5 years or a colonoscopy every 10 years starting at age 64. Prostate cancer screening. Recommendations will vary depending on your family history and other risks. Hepatitis C blood test. Hepatitis B blood test. Sexually transmitted disease (STD) testing. Diabetes screening. This is done by checking your blood sugar (glucose) after you have not eaten for a while (  fasting). You may have this done every 1-3 years. Abdominal aortic aneurysm (AAA) screening. You may need this if you are a current or former smoker. Osteoporosis. You may be screened starting at age 82 if you are at high risk. Talk with your health care provider about your test results, treatment options, and if necessary, the need for more tests. Vaccines  Your health care  provider may recommend certain vaccines, such as: Influenza vaccine. This is recommended every year. Tetanus, diphtheria, and acellular pertussis (Tdap, Td) vaccine. You may need a Td booster every 10 years. Zoster vaccine. You may need this after age 66. Pneumococcal 13-valent conjugate (PCV13) vaccine. One dose is recommended after age 59. Pneumococcal polysaccharide (PPSV23) vaccine. One dose is recommended after age 53. Talk to your health care provider about which screenings and vaccines you need and how often you need them. This information is not intended to replace advice given to you by your health care provider. Make sure you discuss any questions you have with your health care provider. Document Released: 06/17/2015 Document Revised: 02/08/2016 Document Reviewed: 03/22/2015 Elsevier Interactive Patient Education  2017 [employer] Prevention in the Home Falls can cause injuries. They can happen to people of all ages. There are many things you can do to make your home safe and to help prevent falls. What can I do on the outside of my home? Regularly fix the edges of walkways and driveways and fix any cracks. Remove anything that might make you trip as you walk through a door, such as a raised step or threshold. Trim any bushes or trees on the path to your home. Use bright outdoor lighting. Clear any walking paths of anything that might make someone trip, such as rocks or tools. Regularly check to see if handrails are loose or broken. Make sure that both sides of any steps have handrails. Any raised decks and porches should have guardrails on the edges. Have any leaves, snow, or ice cleared regularly. Use sand or salt on walking paths during winter. Clean up any spills in your garage right away. This includes oil or grease spills. What can I do in the bathroom? Use night lights. Install grab bars by the toilet and in the tub and shower. Do not use towel bars as grab  bars. Use non-skid mats or decals in the tub or shower. If you need to sit down in the shower, use a plastic, non-slip stool. Keep the floor dry. Clean up any water that spills on the floor as soon as it happens. Remove soap buildup in the tub or shower regularly. Attach bath mats securely with double-sided non-slip rug tape. Do not have throw rugs and other things on the floor that can make you trip. What can I do in the bedroom? Use night lights. Make sure that you have a light by your bed that is easy to reach. Do not use any sheets or blankets that are too big for your bed. They should not hang down onto the floor. Have a firm chair that has side arms. You can use this for support while you get dressed. Do not have throw rugs and other things on the floor that can make you trip. What can I do in the kitchen? Clean up any spills right away. Avoid walking on wet floors. Keep items that you use a lot in easy-to-reach places. If you need to reach something above you, use a strong step stool that has a grab bar.  Keep electrical cords out of the way. Do not use floor polish or wax that makes floors slippery. If you must use wax, use non-skid floor wax. Do not have throw rugs and other things on the floor that can make you trip. What can I do with my stairs? Do not leave any items on the stairs. Make sure that there are handrails on both sides of the stairs and use them. Fix handrails that are broken or loose. Make sure that handrails are as long as the stairways. Check any carpeting to make sure that it is firmly attached to the stairs. Fix any carpet that is loose or worn. Avoid having throw rugs at the top or bottom of the stairs. If you do have throw rugs, attach them to the floor with carpet tape. Make sure that you have a light switch at the top of the stairs and the bottom of the stairs. If you do not have them, ask someone to add them for you. What else can I do to help prevent  falls? Wear shoes that: Do not have high heels. Have rubber bottoms. Are comfortable and fit you well. Are closed at the toe. Do not wear sandals. If you use a stepladder: Make sure that it is fully opened. Do not climb a closed stepladder. Make sure that both sides of the stepladder are locked into place. Ask someone to hold it for you, if possible. Clearly mark and make sure that you can see: Any grab bars or handrails. First and last steps. Where the edge of each step is. Use tools that help you move around (mobility aids) if they are needed. These include: Canes. Walkers. Scooters. Crutches. Turn on the lights when you go into a dark area. Replace any light bulbs as soon as they burn out. Set up your furniture so you have a clear path. Avoid moving your furniture around. If any of your floors are uneven, fix them. If there are any pets around you, be aware of where they are. Review your medicines with your doctor. Some medicines can make you feel dizzy. This can increase your chance of falling. Ask your doctor what other things that you can do to help prevent falls. This information is not intended to replace advice given to you by your health care provider. Make sure you discuss any questions you have with your health care provider. Document Released: 03/17/2009 Document Revised: 10/27/2015 Document Reviewed: 06/25/2014 Elsevier Interactive Patient Education  2017 [employer].

## 2022-09-27 NOTE — Progress Notes (Signed)
Subjective:   [name] is a 72 y.o. male who presents for Medicare Annual/Subsequent preventive examination.  Review of Systems    Cardiac Risk Factors include: advanced age (>42men, >90 women);diabetes mellitus;dyslipidemia;hypertension;male gender;obesity (BMI >30kg/m2);sedentary lifestyle    Objective:    Today's Vitals   09/27/22 1352  BP: 132/70  Weight: 255 lb 14.4 oz (116.1 kg)  Height: 6' (1.829 m)   Body mass index is 34.71 kg/m.     09/27/2022    2:13 PM 09/30/2020   11:56 AM 09/27/2020    7:05 PM 09/27/2020    7:00 PM 07/12/2020    1:25 PM 07/12/2020    8:57 AM 12/10/2019    2:00 PM  Advanced Directives  Does Patient Have a Medical Advance Directive? Yes   No  No No  Would patient like information on creating a medical advance directive?  No - Patient declined No - Patient declined  No - Patient declined  No - Patient declined    Current Medications (verified) Outpatient Encounter Medications as of 09/27/2022  Medication Sig   amiodarone (PACERONE) 200 MG tablet TAKE 1 TABLET BY MOUTH ONCE DAILY.   apixaban (ELIQUIS) 5 MG TABS tablet TAKE ONE TABLET BY MOUTH TWICE DAILY   Bromelains (BROMELAIN PO) Take 2 capsules by mouth daily.   carvedilol (COREG) 12.5 MG tablet TAKE (1) TABLET TWICE A DAY WITH FOOD---BREAKFAST AND SUPPER.   CINNAMON PO Take 1,200 mg by mouth in the morning and at bedtime. [place] Cinnamon 1200mg    Coenzyme Q10 (COQ10) 400 MG CAPS Take 400 mg by mouth 2 (two) times daily.   docusate sodium (COLACE) 100 MG capsule 2 (two) times daily. Take 300 mg in the pm & 200 mg in the am.   glipiZIDE (GLUCOTROL) 10 MG tablet TAKE ONE TABLET BY MOUTH TWICE DAILY (Patient taking differently: Take 20 mg by mouth once.)   HAWTHORNE BERRY PO Take 565 mg by mouth at bedtime.   Insulin Glargine (TOUJEO SOLOSTAR [place]) Inject 100 Units into the skin as needed.   JARDIANCE 10 MG TABS tablet Take 10 mg by mouth every evening.    ketoconazole (NIZORAL) 2 % shampoo  Apply 1 Application topically 2 (two) times a week.   levothyroxine (SYNTHROID) 50 MCG tablet Take 50 mcg by mouth daily before breakfast.   losartan (COZAAR) 25 MG tablet Take 0.5 tablets (12.5 mg total) by mouth daily.   magnesium oxide (MAG-OX) 400 (240 Mg) MG tablet TAKE ONE TABLET BY MOUTH ONCE DAILY AS NEEDED   magnesium oxide (MAG-OX) 400 (241.3 Mg) MG tablet Take 1 tablet (400 mg total) by mouth daily.   MELATONIN-THEANINE PO Take 2 capsules by mouth at bedtime. Olly Sleep Gummies   metolazone (ZAROXOLYN) 2.5 MG tablet TAKE ONE TABLET BY MOUTH EVERY MORNING take along with torsemide   mometasone (ELOCON) 0.1 % cream Apply 1 Application topically daily. To areas of psoriasis   Multiple Vitamins-Minerals (PRESERVISION AREDS 2 PO) Take 1 tablet by mouth in the morning and at bedtime.   potassium chloride SA (KLOR-CON M) 20 MEQ tablet Take 3 tablets (60 mEq total) by mouth 3 (three) times daily.   rosuvastatin (CRESTOR) 20 MG tablet TAKE 1 TABLET BY MOUTH ONCE DAILY.   tirzepatide [facility]) 12.5 MG/0.5ML Pen Inject into the skin once a week.   torsemide (DEMADEX) 20 MG tablet TAKE (2) TABLETS BY MOUTH TWICE DAILY.   Torsemide 40 MG TABS Take 40 mg by mouth as directed. Take 40 mg once  daily with extra 20 mg as needed for weight greater than 275   traZODone (DESYREL) 50 MG tablet Take 1 tablet (50 mg total) by mouth at bedtime.   valACYclovir (VALTREX) 1000 MG tablet Take 1,000 mg by mouth daily as needed.   vitamin C (ASCORBIC ACID) 500 MG tablet Take 500 mg by mouth at bedtime.   zinc gluconate 50 MG tablet Take 50 mg by mouth daily.   No facility-administered encounter medications on file as of 09/27/2022.    Allergies (verified) Other   History: Past Medical History:  Diagnosis Date   Allergy [phone]   Anxiety    Mild - Intermiteant   Cataract    early stage   CHF (congestive heart failure)    CKD (chronic kidney disease) stage 3, GFR 30-59 ml/min    Coronary artery disease     a. 1998 s/p ACS Multi-link BMS to the prox LAD ([place], [place]); b. 07/2015 Cath: LM nl, LAD patent stent, LCX min irregs, OM1/2 min irregs, OM3 nl, RCA min irregs; c. 07/2020 Cath: LM nl, LAD 50p/m, 70d, D2 70, LCX 40p/61m, OM3 40, RCA 60d->Med Rx.   Depression    Intermiteant   Diabetes mellitus type 2, uncontrolled    a. A1c 11.0 in 07/2015.   Glaucoma    Resolved with laser   HFrEF    a. EF 25% by cath in 2013; b. Echo in 07/2015 showing EF of 15-20%, moderate MR, moderate Pulm HTN, severely dilated IVC; c. 11/2016 Echo: EF 55-60%, no rwma, Gr1 DD, mildly dil LA, nl RV fxn. d. 12/2019 Echo: EF 40-45%, e. 07/2020 Echo: EF 20-25%, sev red RV fxx; f. 02/2021 Echo: EF 40-45%, glob HK/signif sept HK. Nl RV fxn.   History of kidney stones    Hypertension    Ischemic cardiomyopathy    Kidney stones    Left   Myocardial infarction    Paroxysmal atrial fibrillation    a. Dx 07/2015; b. CHA2DS2VASc = 5-->eliquis. Rhythm control w/ amiodarone.   Pollen allergies 09/21/2015   pt called and stated that he woke up and had some drainage-pt states he did not see the color of the drainage-pt denies running a fever and this only happened once-pt instructed to call Dr Assunta Gambles office if he starts running a fever or if the color of drainage becomes yellow/green   Psoriasis    Pulmonary hypertension    Sleep apnea    Past Surgical History:  Procedure Laterality Date   CARDIAC CATHETERIZATION N/A 07/25/2015   Procedure: Right/Left Heart Cath and Coronary Angiography;  Surgeon: Iran Ouch, MD;  Location: [facility] INVASIVE CV LAB;  Service: Cardiovascular;  Laterality: N/A;   CARDIAC [place]     [place]   CARDIAC CATHETERIZATION     [place], [place]   CORONARY ANGIOPLASTY WITH STENT PLACEMENT     CYSTOSCOPY WITH STENT PLACEMENT Left 09/07/2015   Procedure: CYSTOSCOPY WITH STENT PLACEMENT;  Surgeon: Vanna Scotland, MD;  Location: [facility] ORS;  Service: Urology;  Laterality: Left;    CYSTOSCOPY/URETEROSCOPY/HOLMIUM LASER/STENT PLACEMENT Left 10/25/2015   Procedure: CYSTOSCOPY/URETEROSCOPY/HOLMIUM LASER/STENT EXCHANGE;  Surgeon: Vanna Scotland, MD;  Location: [facility] ORS;  Service: Urology;  Laterality: Left;   CYSTOSCOPY/URETEROSCOPY/HOLMIUM LASER/STENT PLACEMENT Left 05/11/2019   Procedure: CYSTOSCOPY/URETEROSCOPY/HOLMIUM LASER/STENT PLACEMENT;  Surgeon: Vanna Scotland, MD;  Location: [facility] ORS;  Service: Urology;  Laterality: Left;   EXTRACORPOREAL SHOCK WAVE LITHOTRIPSY Left 04/02/2019   Procedure: EXTRACORPOREAL SHOCK WAVE LITHOTRIPSY (ESWL);  Surgeon: Vanna Scotland, MD;  Location: [facility]  ORS;  Service: Urology;  Laterality: Left;   EXTRACORPOREAL SHOCK WAVE LITHOTRIPSY Left 03/05/2019   Procedure: EXTRACORPOREAL SHOCK WAVE LITHOTRIPSY (ESWL);  Surgeon: Riki Altes, MD;  Location: [facility] ORS;  Service: Urology;  Laterality: Left;   EYE SURGERY Bilateral    LASER FOR GLAUCOMA   IRRIGATION AND DEBRIDEMENT FOOT Left 09/30/2020   Procedure: IRRIGATION AND DEBRIDEMENT FOOT;  Surgeon: Edwin Cap, DPM;  Location: [facility] ORS;  Service: Podiatry;  Laterality: Left;   KIDNEY SURGERY     LOWER EXTREMITY ANGIOGRAPHY Left 10/04/2020   Procedure: Lower Extremity Angiography;  Surgeon: Renford Dills, MD;  Location: [facility];  Service: Cardiovascular;  Laterality: Left;   NEPHROSTOMY TUBE PLACEMENT ([facility] HX) Left 11/2019   RIGHT/LEFT HEART CATH AND CORONARY ANGIOGRAPHY N/A 07/12/2020   Procedure: RIGHT/LEFT HEART CATH AND CORONARY ANGIOGRAPHY;  Surgeon: Yvonne Kendall, MD;  Location: [facility];  Service: Cardiovascular;  Laterality: N/A;   ROBOT ASSISTED LAPAROSCOPIC NEPHRECTOMY Left 12/10/2019   Procedure: XI ROBOTIC ASSISTED LAPAROSCOPIC NEPHRECTOMY;  Surgeon: Vanna Scotland, MD;  Location: [facility] OR[facility];  Service: Urology;  Laterality: Left;   URETEROSCOPY WITH HOLMIUM LASER LITHOTRIPSY Left 09/07/2015   Procedure: URETEROSCOPY WITH HOLMIUM LASER  LITHOTRIPSY;  Surgeon: Vanna Scotland, MD;  Location: [facility] OR[facility];  Service: Urology;  Laterality: Left;   Family History  Problem Relation Age of Onset   Heart failure Father    Arthritis Father    Heart disease Father    Hypertension Father    Heart attack Brother    Diabetes Brother    Heart disease Brother    Hypertension Brother    Birth defects Sister    Early death Sister    Early death Brother    Heart disease Brother    Hypertension Brother    Kidney cancer Neg Hx    Bladder Cancer Neg Hx    Prostate cancer Neg Hx    Social History   Socioeconomic History   Marital status: Single    Spouse name: Not on file   Number of children: Not on file   Years of education: Not on file   Highest education level: Not on file  Occupational History   Not on file  Tobacco Use   Smoking status: Never   Smokeless tobacco: Never  Vaping Use   Vaping Use: Never used  Substance and Sexual Activity   Alcohol use: Yes    Comment: less than 1   Drug use: Yes    Types: Cocaine, LSD, Marijuana, Opium    Comment: with the exception of marijuana nothing used in 30 years!   Sexual activity: Not Currently    Birth control/protection: Condom  Other Topics Concern   Not on file  Social History Narrative   Not on file   Social Determinants of Health   Financial Resource Strain: Low Risk  (09/27/2022)   Overall Financial Resource Strain (CARDIA)    Difficulty of Paying Living Expenses: Not hard at all  Food Insecurity: No Food Insecurity (09/27/2022)   Hunger Vital Sign    Worried About Running Out of Food in the Last Year: Never true    Ran Out of Food in the Last Year: Never true  Transportation Needs: No Transportation Needs (09/27/2022)   PRAPARE - Administrator, Civil Service (Medical): No    Lack of Transportation (Non-Medical): No  Physical Activity: Inactive (09/27/2022)   Exercise Vital Sign    Days of Exercise per Week:  0 days    Minutes of Exercise per Session:  0 min  Stress: No Stress Concern Present (09/27/2022)   [employer] of Occupational Health - Occupational Stress Questionnaire    Feeling of Stress : Only a little  Social Connections: Moderately Isolated (09/27/2022)   Social Connection and Isolation Panel [NHANES]    Frequency of Communication with Friends and Family: More than three times a week    Frequency of Social Gatherings with Friends and Family: Never    Attends Religious Services: Never    Database administrator or Organizations: No    Attends Engineer, structural: Never    Marital Status: Married    Tobacco Counseling Counseling given: Not Answered   Clinical Intake:  Pre-visit preparation completed: Yes  Pain : No/denies pain     BMI - recorded: 34.71 Nutritional Status: BMI > 30  Obese Nutritional Risks: None Diabetes: Yes CBG done?: No Did pt. bring in CBG monitor from home?: No  How often do you need to have someone help you when you read instructions, pamphlets, or other written materials from your doctor or pharmacy?: 1 - Never  Diabetic?yes  Interpreter Needed?: No  Comments: lives with house mate Information entered by :: B.Aasiyah Auerbach,LPN   Activities of Daily Living    09/27/2022    2:14 PM 08/14/2022   12:43 PM  In your present state of health, do you have any difficulty performing the following activities:  Hearing? 1 1  Vision? 1 1  Difficulty concentrating or making decisions? 0 0  Walking or climbing stairs? 1 1  Dressing or bathing? 0 0  Doing errands, shopping? 0 0  Preparing Food and eating ? N   Using the Toilet? N   In the past six months, have you accidently leaked urine? N   Do you have problems with loss of bowel control? N   Managing your Medications? N   Managing your Finances? N   Housekeeping or managing your Housekeeping? N     Patient Care Team: Margarita Mail, DO as PCP - General (Internal Medicine) End, Cristal Deer, MD as PCP - Cardiology  (Cardiology) Delma Freeze, FNP as Nurse Practitioner (Family Medicine) Vanna Scotland, MD as Consulting Physician (Urology) Kinnie Feil, MD (Nephrology)  Indicate any recent Medical Services you may have received from other than [facility] providers in the past year (date may be approximate).     Assessment:   This is a routine wellness examination for Erbie.  Hearing/Vision screen Hearing Screening - Comments:: Adequate hearing Vision Screening - Comments:: Adequate vision w/glasses [facility]  Dietary issues and exercise activities discussed: Current Exercise Habits: The patient does not participate in regular exercise at present, Exercise limited by: orthopedic condition(s)   Goals Addressed   None    Depression Screen    09/27/2022    2:09 PM 08/14/2022   12:43 PM 07/16/2022    1:17 PM 03/21/2016   11:18 AM 12/23/2015   10:46 AM 09/22/2015    9:17 AM 08/08/2015    9:26 AM  PHQ 2/9 Scores  PHQ - 2 Score 2 2 2  0 2 0 0  PHQ- 9 Score 3 2 2  7       Fall Risk    09/27/2022    1:57 PM 08/14/2022   12:39 PM 07/16/2022    1:16 PM 07/29/2020    1:18 PM 03/21/2016   11:18 AM  Fall Risk   Falls in the past year? 1 1  1 0 No  Number falls in past yr: 0 1 1 0   Injury with Fall? 0 0 0 0   Risk for fall due to : No Fall Risks      Follow up Falls prevention discussed;Education provided   Falls evaluation completed     FALL RISK PREVENTION PERTAINING TO THE HOME:  Any stairs in or around the home? Yes ramp If so, are there any without handrails? Yes  Home free of loose throw rugs in walkways, pet beds, electrical cords, etc? Yes  Adequate lighting in your home to reduce risk of falls? Yes   ASSISTIVE DEVICES UTILIZED TO PREVENT FALLS:  Life alert? No  Use of a cane, walker or w/c? Yes cane Grab bars in the bathroom? Yes  Shower chair or bench in shower? Yes  Elevated toilet seat or a handicapped toilet? Yes   TIMED UP AND GO:  Was the test performed? Yes .  Length of  time to ambulate 10 feet: 12 sec.   Gait slow and steady with assistive device  Cognitive Function:        09/27/2022    2:16 PM  6CIT Screen  What Year? 0 points  What month? 0 points  What time? 0 points  Count back from 20 0 points  Months in reverse 0 points  Repeat phrase 0 points  Total Score 0 points    Immunizations Immunization History  Administered Date(s) Administered   Moderna Covid-19 Vaccine Bivalent Booster 86yrs & up 09/06/2020, 02/22/2021   Moderna Sars-Covid-2 Vaccination 07/13/2019, 08/05/2019, 03/08/2020   Pneumococcal Polysaccharide-23 10/26/2015   Vaccinia,smallpox Monkeypox Vaccine Live,pf 04/25/2021    TDAP status: Due, Education has been provided regarding the importance of this vaccine. Advised may receive this vaccine at local pharmacy or Health Dept. Aware to provide a copy of the vaccination record if obtained from local pharmacy or Health Dept. Verbalized acceptance and understanding.  Flu Vaccine status: Declined, Education has been provided regarding the importance of this vaccine but patient still declined. Advised may receive this vaccine at local pharmacy or Health Dept. Aware to provide a copy of the vaccination record if obtained from local pharmacy or Health Dept. Verbalized acceptance and understanding.  Pneumococcal vaccine status: Up to date  Covid-19 vaccine status: Completed vaccines  Qualifies for Shingles Vaccine? Yes   Zostavax completed No   Shingrix Completed?: No.    Education has been provided regarding the importance of this vaccine. Patient has been advised to call insurance company to determine out of pocket expense if they have not yet received this vaccine. Advised may also receive vaccine at local pharmacy or Health Dept. Verbalized acceptance and understanding.  Screening Tests Health Maintenance  Topic Date Due   OPHTHALMOLOGY EXAM  Never done   Diabetic kidney evaluation - Urine ACR  Never done   Hepatitis C  Screening  Never done   COLONOSCOPY (Pts 45-50yrs Insurance coverage will need to be confirmed)  Never done   Pneumonia Vaccine 98+ Years old (2 of 2 - PCV) 10/25/2016   HEMOGLOBIN A1C  07/14/2021   COVID-19 Vaccine (6 - 2023-24 season) 02/02/2022   Zoster Vaccines- Shingrix (1 of 2) 10/14/2022 (Originally 06/28/1969)   FOOT EXAM  10/17/2022   INFLUENZA VACCINE  01/03/2023   Diabetic kidney evaluation - eGFR measurement  09/07/2023   Medicare Annual Wellness (AWV)  09/27/2023   HPV VACCINES  Aged Out   DTaP/Tdap/Td  Discontinued    Health Maintenance  Health Maintenance  Due  Topic Date Due   OPHTHALMOLOGY EXAM  Never done   Diabetic kidney evaluation - Urine ACR  Never done   Hepatitis C Screening  Never done   COLONOSCOPY (Pts 45-37yrs Insurance coverage will need to be confirmed)  Never done   Pneumonia Vaccine 43+ Years old (2 of 2 - PCV) 10/25/2016   HEMOGLOBIN A1C  07/14/2021   COVID-19 Vaccine (6 - 2023-24 season) 02/02/2022    Colorectal cancer screening: Type of screening: Colonoscopy. Completed no. Repeat every 5-10 years pt declines  Lung Cancer Screening: (Low Dose CT Chest recommended if Age 61-80 years, 30 pack-year currently smoking OR have quit w/in 15years.) does not qualify.   Lung Cancer Screening Referral: no  Additional Screening:  Hepatitis C Screening: does not qualify; Completed no  Vision Screening: Recommended annual ophthalmology exams for early detection of glaucoma and other disorders of the eye. Is the patient up to date with their annual eye exam?  Yes  Who is the provider or what is the name of the office in which the patient attends annual eye exams? [facility] If pt is not established with a provider, would they like to be referred to a provider to establish care? No .   Dental Screening: Recommended annual dental exams for proper oral hygiene  Community Resource Referral / Chronic Care Management: CRR required this visit?  No   CCM  required this visit?  No     Plan:     I have personally reviewed and noted the following in the patient's chart:   Medical and social history Use of alcohol, tobacco or illicit drugs  Current medications and supplements including opioid prescriptions. Patient is not currently taking opioid prescriptions. Functional ability and status Nutritional status Physical activity Advanced directives List of other physicians Hospitalizations, surgeries, and ER visits in previous 12 months Vitals Screenings to include cognitive, depression, and falls Referrals and appointments  In addition, I have reviewed and discussed with patient certain preventive protocols, quality metrics, and best practice recommendations. A written personalized care plan for preventive services as well as general preventive health recommendations were provided to patient.     Sue Lush, LPN   [id number]   Nurse Notes: Pt presents w/cane slowly with house mate. 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 He states he is maintaining at his baseline. He has no concerns or questions at this time.

## 2022-09-30 ENCOUNTER — Other Ambulatory Visit: Payer: Self-pay | Admitting: Internal Medicine

## 2022-10-02 DIAGNOSIS — N1832 Chronic kidney disease, stage 3b: Secondary | ICD-10-CM | POA: Diagnosis not present

## 2022-10-02 DIAGNOSIS — G4733 Obstructive sleep apnea (adult) (pediatric): Secondary | ICD-10-CM | POA: Diagnosis not present

## 2022-10-02 DIAGNOSIS — I10 Essential (primary) hypertension: Secondary | ICD-10-CM | POA: Diagnosis not present

## 2022-10-02 DIAGNOSIS — R600 Localized edema: Secondary | ICD-10-CM | POA: Diagnosis not present

## 2022-10-02 NOTE — Telephone Encounter (Signed)
Requested Prescriptions  Pending Prescriptions Disp Refills   torsemide (DEMADEX) 20 MG tablet [Pharmacy Med Name: torsemide 20 mg tablet] 90 tablet 1    Sig: TAKE TWO TABLETS BY MOUTH TWICE DAILY     Cardiovascular:  Diuretics - Loop Failed - 09/30/2022  8:03 AM      Failed - Cr in normal range and within 180 days    Creatinine, Ser  Date Value Ref Range Status  09/07/2022 2.13 (H) 0.61 - 1.24 mg/dL Final         Failed - Cl in normal range and within 180 days    Chloride  Date Value Ref Range Status  09/07/2022 92 (L) 98 - 111 mmol/L Final         Failed - Mg Level in normal range and within 180 days    Magnesium  Date Value Ref Range Status  10/01/2020 2.3 1.7 - 2.4 mg/dL Final    Comment:    Performed at [facility], [address] [place], [place] [address]         Passed - K in normal range and within 180 days    Potassium  Date Value Ref Range Status  09/07/2022 3.7 3.5 - 5.1 mmol/L Final         Passed - Ca in normal range and within 180 days    Calcium  Date Value Ref Range Status  09/07/2022 9.0 8.9 - 10.3 mg/dL Final         Passed - Na in normal range and within 180 days    Sodium  Date Value Ref Range Status  09/07/2022 135 135 - 145 mmol/L Final  01/11/2021 137 134 - 144 mmol/L Final         Passed - Last BP in normal range    BP Readings from Last 1 Encounters:  09/27/22 132/70         Passed - Valid encounter within last 6 months    Recent Outpatient Visits           1 month ago Other insomnia   [facility] Margarita Mail, DO   2 months ago Hypertension, unspecified type   [facility] Margarita Mail, DO       Future Appointments             In 4 months Margarita Mail, DO [place] [facility]   In 4 months End, Cristal Deer, MD [facility]

## 2022-10-23 ENCOUNTER — Other Ambulatory Visit: Payer: Self-pay | Admitting: Internal Medicine

## 2022-10-23 NOTE — Telephone Encounter (Signed)
Requested Prescriptions  Pending Prescriptions Disp Refills   magnesium oxide (MAG-OX) 400 (240 Mg) MG tablet [Pharmacy Med Name: magnesium oxide 400 mg (241.3 mg magnesium) tablet] 30 tablet 2    Sig: TAKE ONE TABLET BY MOUTH ONCE DAILY AS NEEDED     Endocrinology:  Minerals - Magnesium Supplementation Failed - 10/23/2022 12:11 PM      Failed - Mg Level in normal range and within 360 days    Magnesium  Date Value Ref Range Status  10/01/2020 2.3 1.7 - 2.4 mg/dL Final    Comment:    Performed at [facility], [address] [place], [place] [address]         Failed - Cr in normal range and within 360 days    Creatinine, Ser  Date Value Ref Range Status  09/07/2022 2.13 (H) 0.61 - 1.24 mg/dL Final         Passed - Valid encounter within last 12 months    Recent Outpatient Visits           2 months ago Other insomnia   [facility] Margarita Mail, DO   3 months ago Hypertension, unspecified type   [facility] Margarita Mail, DO       Future Appointments             In 3 months Margarita Mail, DO [facility]   In 4 months End, Cristal Deer, MD [place] [facility] 5 MG TABS tablet [Pharmacy Med Name: Eliquis 5 mg tablet] 60 tablet 2    Sig: TAKE ONE TABLET BY MOUTH TWICE DAILY     Hematology:  Anticoagulants - apixaban Failed - 10/23/2022 12:11 PM      Failed - HGB in normal range and within 360 days    Hemoglobin  Date Value Ref Range Status  09/07/2022 12.6 (L) 13.0 - 17.0 g/dL Final  19/14/7829 56.2 13.0 - 17.7 g/dL Final         Failed - HCT in normal range and within 360 days    HCT  Date Value Ref Range Status  09/07/2022 36.1 (L) 39.0 - 52.0 % Final   Hematocrit  Date Value Ref Range Status  07/08/2020 40.3 37.5 - 51.0 % Final         Failed - Cr in normal range and within 360 days    Creatinine, Ser  Date  Value Ref Range Status  09/07/2022 2.13 (H) 0.61 - 1.24 mg/dL Final         Passed - PLT in normal range and within 360 days    Platelets  Date Value Ref Range Status  09/07/2022 156 150 - 400 K/uL Final  07/08/2020 144 (L) 150 - 450 x10E3/uL Final         Passed - AST in normal range and within 360 days    AST  Date Value Ref Range Status  09/07/2022 26 15 - 41 U/L Final         Passed - ALT in normal range and within 360 days    ALT  Date Value Ref Range Status  09/07/2022 24 0 - 44 U/L Final         Passed - Valid encounter within last 12 months    Recent Outpatient Visits           2 months ago Other  insomnia   [facility] Margarita Mail, DO   3 months ago Hypertension, unspecified type   [facility] Margarita Mail, DO       Future Appointments             In 3 months Margarita Mail, DO [name]   In 4 months End, Cristal Deer, MD [facility]             glipiZIDE (GLUCOTROL) 10 MG tablet [Pharmacy Med Name: glipizide 10 mg tablet] 60 tablet 2    Sig: TAKE ONE TABLET BY MOUTH TWICE DAILY     Endocrinology:  Diabetes - Sulfonylureas Failed - 10/23/2022 12:11 PM      Failed - HBA1C is between 0 and 7.9 and within 180 days    Hgb A1c MFr Bld  Date Value Ref Range Status  01/11/2021 10.2 (H) 4.8 - 5.6 % Final    Comment:             Prediabetes: 5.7 - 6.4          Diabetes: >6.4          Glycemic control for adults with diabetes: <7.0          Failed - Cr in normal range and within 360 days    Creatinine, Ser  Date Value Ref Range Status  09/07/2022 2.13 (H) 0.61 - 1.24 mg/dL Final         Passed - Valid encounter within last 6 months    Recent Outpatient Visits           2 months ago Other insomnia   [facility] Margarita Mail, DO   3 months ago Hypertension, unspecified type   [facility] Margarita Mail, DO       Future Appointments             In 3 months Margarita Mail, DO [facility], [facility]   In 4 months End, Cristal Deer, MD [facility]             metolazone (ZAROXOLYN) 2.5 MG tablet [Pharmacy Med Name: metolazone 2.5 mg tablet] 30 tablet 2    Sig: TAKE ONE TABLET BY MOUTH EVERY MORNING take along with torsemide     Cardiovascular:  Diuretics - Thiazide - metolazone Failed - 10/23/2022 12:11 PM      Failed - Cr in normal range and within 180 days    Creatinine, Ser  Date Value Ref Range Status  09/07/2022 2.13 (H) 0.61 - 1.24 mg/dL Final         Passed - Na in normal range and within 180 days    Sodium  Date Value Ref Range Status  09/07/2022 135 135 - 145 mmol/L Final  01/11/2021 137 134 - 144 mmol/L Final         Passed - K in normal range and within 180 days    Potassium  Date Value Ref Range Status  09/07/2022 3.7 3.5 - 5.1 mmol/L Final         Passed - Last BP in normal range    BP Readings from Last 1 Encounters:  09/27/22 132/70         Passed - Valid encounter within last 6 months    Recent Outpatient Visits           2 months ago Other insomnia  [facility] Margarita Mail, DO   3 months ago Hypertension, unspecified type   [facility] Margarita Mail, DO       Future Appointments             In 3 months Margarita Mail, DO [facility]   In 4 months End, Cristal Deer, MD [facility]

## 2022-10-23 NOTE — Telephone Encounter (Signed)
Requested medication (s) are due for refill today: Yes  Requested medication (s) are on the active medication list: yes    Last refill: 07/20/22  #30  2 refills  Future visit scheduled yes 02/14/23  Notes to clinic:Failed due to labs, please review. Thank you.  Requested Prescriptions  Pending Prescriptions Disp Refills   magnesium oxide (MAG-OX) 400 (240 Mg) MG tablet [Pharmacy Med Name: magnesium oxide 400 mg (241.3 mg magnesium) tablet] 30 tablet 2    Sig: TAKE ONE TABLET BY MOUTH ONCE DAILY AS NEEDED     Endocrinology:  Minerals - Magnesium Supplementation Failed - 10/23/2022 12:11 PM      Failed - Mg Level in normal range and within 360 days    Magnesium  Date Value Ref Range Status  10/01/2020 2.3 1.7 - 2.4 mg/dL Final    Comment:    Performed at [facility], [address] [place], [place] [address]         Failed - Cr in normal range and within 360 days    Creatinine, Ser  Date Value Ref Range Status  09/07/2022 2.13 (H) 0.61 - 1.24 mg/dL Final         Passed - Valid encounter within last 12 months    Recent Outpatient Visits           2 months ago Other insomnia   [place] [facility] Margarita Mail, DO   3 months ago Hypertension, unspecified type   [facility] Margarita Mail, DO       Future Appointments             In 3 months Margarita Mail, DO [place] [facility]   In 4 months End, Cristal Deer, MD [facility] at [facility]            Signed Prescriptions Disp Refills   ELIQUIS 5 MG TABS tablet 60 tablet 2    Sig: TAKE ONE TABLET BY MOUTH TWICE DAILY     Hematology:  Anticoagulants - apixaban Failed - 10/23/2022 12:11 PM      Failed - HGB in normal range and within 360 days    Hemoglobin  Date Value Ref Range Status  09/07/2022 12.6 (L) 13.0 - 17.0 g/dL Final  60/45/4098 11.9 13.0 - 17.7 g/dL Final         Failed - HCT in normal range and  within 360 days    HCT  Date Value Ref Range Status  09/07/2022 36.1 (L) 39.0 - 52.0 % Final   Hematocrit  Date Value Ref Range Status  07/08/2020 40.3 37.5 - 51.0 % Final         Failed - Cr in normal range and within 360 days    Creatinine, Ser  Date Value Ref Range Status  09/07/2022 2.13 (H) 0.61 - 1.24 mg/dL Final         Passed - PLT in normal range and within 360 days    Platelets  Date Value Ref Range Status  09/07/2022 156 150 - 400 K/uL Final  07/08/2020 144 (L) 150 - 450 x10E3/uL Final         Passed - AST in normal range and within 360 days    AST  Date Value Ref Range Status  09/07/2022 26 15 - 41 U/L Final         Passed - ALT in normal range and within 360 days    ALT  Date Value  Ref Range Status  09/07/2022 24 0 - 44 U/L Final         Passed - Valid encounter within last 12 months    Recent Outpatient Visits           2 months ago Other insomnia   [facility] Marg[facility] Mail, DO   3 months ago Hypertension, unspecified type   [facility] Margarita Mail, DO       Future Appointments             In 3 months Margarita Mail, DO [facility]   In 4 months End, Cristal Deer, MD [place] [facility]             glipiZIDE (GLUCOTROL) 10 MG tablet 60 tablet 2    Sig: TAKE ONE TABLET BY MOUTH TWICE DAILY     Endocrinology:  Diabetes - Sulfonylureas Failed - 10/23/2022 12:11 PM      Failed - HBA1C is between 0 and 7.9 and within 180 days    Hgb A1c MFr Bld  Date Value Ref Range Status  01/11/2021 10.2 (H) 4.8 - 5.6 % Final    Comment:             Prediabetes: 5.7 - 6.4          Diabetes: >6.4          Glycemic control for adults with diabetes: <7.0          Failed - Cr in normal range and within 360 days    Creatinine, Ser  Date Value Ref Range Status  09/07/2022 2.13 (H) 0.61 - 1.24 mg/dL Final         Passed - Valid  encounter within last 6 months    Recent Outpatient Visits           2 months ago Other insomnia   [facility] Margarita Mail, DO   3 months ago Hypertension, unspecified type   [facility] Margarita Mail, DO       Future Appointments             In 3 months Margarita Mail, DO [facility]   In 4 months End, Cristal Deer, MD [place] [facility]             metolazone (ZAROXOLYN) 2.5 MG tablet 30 tablet 2    Sig: TAKE ONE TABLET BY MOUTH EVERY MORNING take along with torsemide     Cardiovascular:  Diuretics - Thiazide - metolazone Failed - 10/23/2022 12:11 PM      Failed - Cr in normal range and within 180 days    Creatinine, Ser  Date Value Ref Range Status  09/07/2022 2.13 (H) 0.61 - 1.24 mg/dL Final         Passed - Na in normal range and within 180 days    Sodium  Date Value Ref Range Status  09/07/2022 135 135 - 145 mmol/L Final  01/11/2021 137 134 - 144 mmol/L Final         Passed - K in normal range and within 180 days    Potassium  Date Value Ref Range Status  09/07/2022 3.7 3.5 - 5.1 mmol/L Final         Passed - Last BP in normal range    BP Readings from Last 1 Encounters:  09/27/22 132/70  Passed - Valid encounter within last 6 months    Recent Outpatient Visits           2 months ago Other insomnia   [facility] Margarita Mail, DO   3 months ago Hypertension, unspecified type   [facility] Margarita Mail, DO       Future Appointments             In 3 months Margarita Mail, DO [facility]   In 4 months End, Cristal Deer, MD [facility]

## 2022-11-07 ENCOUNTER — Ambulatory Visit (INDEPENDENT_AMBULATORY_CARE_PROVIDER_SITE_OTHER): Payer: 59 | Admitting: Podiatry

## 2022-11-07 DIAGNOSIS — M79674 Pain in right toe(s): Secondary | ICD-10-CM

## 2022-11-07 DIAGNOSIS — M79675 Pain in left toe(s): Secondary | ICD-10-CM

## 2022-11-07 DIAGNOSIS — B351 Tinea unguium: Secondary | ICD-10-CM

## 2022-11-11 ENCOUNTER — Encounter: Payer: Self-pay | Admitting: Podiatry

## 2022-11-11 NOTE — Progress Notes (Signed)
  Subjective:  Patient ID: [name], male    DOB: [birth year],  MRN: [id number]    72 y.o. male returns for at risk diabetic foot care overall doing well.  Nails are thickened elongated once again.    Review of Systems: Negative except as noted in the HPI. Denies N/V/F/Ch.   Objective:   There were no vitals filed for this visit.  There is no height or weight on file to calculate BMI. Constitutional Well developed. Well nourished.  Vascular Foot warm and well perfused. Capillary refill normal to all digits.   Neurologic Normal speech. Oriented to person, place, and time. Epicritic sensation to light touch grossly reduced bilaterally.  Dermatologic No recurrence of wound or ulceration.  He has thickened elongated toenails x10 bilaterally with subungual brown and yellow discoloration.   Orthopedic: Good smooth range of motion of foot joints    Assessment:   1. Pain due to onychomycosis of toenails of both feet      Plan:  Patient was evaluated and treated and all questions answered.   Patient educated on diabetes. Discussed proper diabetic foot care and discussed risks and complications of disease. Educated patient in depth on reasons to return to the office immediately should he/she discover anything concerning or new on the feet. All questions answered. Discussed proper shoes as well.   Discussed the etiology and treatment options for the condition in detail with the patient.  Recommended debridement of the nails today. Sharp and mechanical debridement performed of all painful and mycotic nails today. Nails debrided in length and thickness using a nail nipper to level of comfort. Discussed treatment options including appropriate shoe gear. Follow up as needed for painful nails.         Return in about 10 weeks (around 01/16/2023) for at risk diabetic foot care.

## 2022-12-01 ENCOUNTER — Other Ambulatory Visit: Payer: Self-pay | Admitting: Internal Medicine

## 2022-12-03 NOTE — Telephone Encounter (Signed)
Requested medications are due for refill today.  yes  Requested medications are on the active medications list.  yes  Last refill. 10/02/2022 #90 1 rf  Future visit scheduled.   yes  Notes to clinic.  Expired magnesium lab.    Requested Prescriptions  Pending Prescriptions Disp Refills   torsemide (DEMADEX) 20 MG tablet [Pharmacy Med Name: torsemide 20 mg tablet] 90 tablet 1    Sig: TAKE TWO TABLETS BY MOUTH TWICE DAILY     Cardiovascular:  Diuretics - Loop Failed - 12/01/2022  8:01 AM      Failed - Cr in normal range and within 180 days    Creatinine, Ser  Date Value Ref Range Status  09/07/2022 2.13 (H) 0.61 - 1.24 mg/dL Final         Failed - Cl in normal range and within 180 days    Chloride  Date Value Ref Range Status  09/07/2022 92 (L) 98 - 111 mmol/L Final         Failed - Mg Level in normal range and within 180 days    Magnesium  Date Value Ref Range Status  10/01/2020 2.3 1.7 - 2.4 mg/dL Final    Comment:    Performed at [facility], [address] [place], [place] [address]         Passed - K in normal range and within 180 days    Potassium  Date Value Ref Range Status  09/07/2022 3.7 3.5 - 5.1 mmol/L Final         Passed - Ca in normal range and within 180 days    Calcium  Date Value Ref Range Status  09/07/2022 9.0 8.9 - 10.3 mg/dL Final         Passed - Na in normal range and within 180 days    Sodium  Date Value Ref Range Status  09/07/2022 135 135 - 145 mmol/L Final  01/11/2021 137 134 - 144 mmol/L Final         Passed - Last BP in normal range    BP Readings from Last 1 Encounters:  09/27/22 132/70         Passed - Valid encounter within last 6 months    Recent Outpatient Visits           3 months ago Other insomnia   [facility] Margarita Mail, DO   4 months ago Hypertension, unspecified type   [facility] Margarita Mail, DO       Future Appointments              In 2 months Margarita Mail, DO [facility]   In 2 months End, Cristal Deer, MD [facility]

## 2022-12-24 ENCOUNTER — Other Ambulatory Visit: Payer: Self-pay | Admitting: Internal Medicine

## 2022-12-24 DIAGNOSIS — G4709 Other insomnia: Secondary | ICD-10-CM

## 2022-12-24 DIAGNOSIS — F419 Anxiety disorder, unspecified: Secondary | ICD-10-CM

## 2022-12-25 NOTE — Telephone Encounter (Signed)
Requested Prescriptions  Pending Prescriptions Disp Refills   traZODone (DESYREL) 50 MG tablet [Pharmacy Med Name: trazodone 50 mg tablet] 90 tablet 0    Sig: TAKE ONE TABLET BY MOUTH AT BEDTIME     Psychiatry: Antidepressants - Serotonin Modulator Passed - 12/24/2022  2:55 PM      Passed - Valid encounter within last 6 months    Recent Outpatient Visits           4 months ago Other insomnia   [facility] Margarita Mail, DO   5 months ago Hypertension, unspecified type   [facility] Margarita Mail, DO       Future Appointments             In 1 month Margarita Mail, DO [facility]   In 2 months End, Cristal Deer, MD [facility]

## 2022-12-28 DIAGNOSIS — E1169 Type 2 diabetes mellitus with other specified complication: Secondary | ICD-10-CM | POA: Diagnosis not present

## 2022-12-28 DIAGNOSIS — E1142 Type 2 diabetes mellitus with diabetic polyneuropathy: Secondary | ICD-10-CM | POA: Diagnosis not present

## 2022-12-28 DIAGNOSIS — E1159 Type 2 diabetes mellitus with other circulatory complications: Secondary | ICD-10-CM | POA: Diagnosis not present

## 2022-12-28 DIAGNOSIS — E039 Hypothyroidism, unspecified: Secondary | ICD-10-CM | POA: Diagnosis not present

## 2022-12-28 DIAGNOSIS — G4733 Obstructive sleep apnea (adult) (pediatric): Secondary | ICD-10-CM | POA: Diagnosis not present

## 2022-12-28 DIAGNOSIS — I152 Hypertension secondary to endocrine disorders: Secondary | ICD-10-CM | POA: Diagnosis not present

## 2022-12-28 DIAGNOSIS — E785 Hyperlipidemia, unspecified: Secondary | ICD-10-CM | POA: Diagnosis not present

## 2023-01-03 ENCOUNTER — Other Ambulatory Visit: Payer: Self-pay | Admitting: Internal Medicine

## 2023-01-03 NOTE — Telephone Encounter (Signed)
Requested medications are due for refill today.  yes  Requested medications are on the active medications list.  yes  Last refill. 10/2022 for all but amiodarone  - refilled 08/16/2022 #30 [phone] rf  Future visit scheduled.   yes  Notes to clinic.  Missing expired labs - Amiodarone is not delegated.    Requested Prescriptions  Pending Prescriptions Disp Refills   magnesium oxide (MAG-OX) 400 (240 Mg) MG tablet [Pharmacy Med Name: magnesium oxide 400 mg (241.3 mg magnesium) tablet] 30 tablet 2    Sig: TAKE ONE TABLET BY MOUTH ONCE DAILY AS NEEDED     Endocrinology:  Minerals - Magnesium Supplementation Failed - 01/03/2023  8:01 AM      Failed - Mg Level in normal range and within 360 days    Magnesium  Date Value Ref Range Status  10/01/2020 2.3 1.7 - 2.4 mg/dL Final    Comment:    Performed at [facility], [address] [place], [place] [address]         Failed - Cr in normal range and within 360 days    Creatinine, Ser  Date Value Ref Range Status  09/07/2022 2.13 (H) 0.61 - 1.24 mg/dL Final         Passed - Valid encounter within last 12 months    Recent Outpatient Visits           4 months ago Other insomnia   [facility] Margarita Mail, DO   5 months ago Hypertension, unspecified type   [facility] Margarita Mail, DO       Future Appointments             In 1 month Margarita Mail, DO [facility]   In 1 month End, Cristal Deer, MD [place] [facility] 5 MG TABS tablet [Pharmacy Med Name: Eliquis 5 mg tablet] 60 tablet 2    Sig: TAKE ONE TABLET BY MOUTH TWICE DAILY     Hematology:  Anticoagulants - apixaban Failed - 01/03/2023  8:01 AM      Failed - HGB in normal range and within 360 days    Hemoglobin  Date Value Ref Range Status  09/07/2022 12.6 (L) 13.0 - 17.0 g/dL Final  30/16/0109 32.3 13.0 - 17.7 g/dL Final          Failed - HCT in normal range and within 360 days    HCT  Date Value Ref Range Status  09/07/2022 36.1 (L) 39.0 - 52.0 % Final   Hematocrit  Date Value Ref Range Status  07/08/2020 40.3 37.5 - 51.0 % Final         Failed - Cr in normal range and within 360 days    Creatinine, Ser  Date Value Ref Range Status  09/07/2022 2.13 (H) 0.61 - 1.24 mg/dL Final         Passed - PLT in normal range and within 360 days    Platelets  Date Value Ref Range Status  09/07/2022 156 150 - 400 K/uL Final  07/08/2020 144 (L) 150 - 450 x10E3/uL Final         Passed - AST in normal range and within 360 days    AST  Date Value Ref Range Status  09/07/2022 26 15 - 41 U/L Final         Passed - ALT in  normal range and within 360 days    ALT  Date Value Ref Range Status  09/07/2022 24 0 - 44 U/L Final         Passed - Valid encounter within last 12 months    Recent Outpatient Visits           4 months ago Other insomnia   [facility] Margarita Mail, DO   5 months ago Hypertension, unspecified type   [facility] Margarita Mail, DO       Future Appointments             In 1 month Margarita Mail, DO [facility] [facility]   In 1 month End, Cristal Deer, MD [place] [facility]             glipiZIDE (GLUCOTROL) 10 MG tablet [Pharmacy Med Name: glipizide 10 mg tablet] 60 tablet 2    Sig: TAKE ONE TABLET BY MOUTH TWICE DAILY     Endocrinology:  Diabetes - Sulfonylureas Failed - 01/03/2023  8:01 AM      Failed - HBA1C is between 0 and 7.9 and within 180 days    Hgb A1c MFr Bld  Date Value Ref Range Status  01/11/2021 10.2 (H) 4.8 - 5.6 % Final    Comment:             Prediabetes: 5.7 - 6.4          Diabetes: >6.4          Glycemic control for adults with diabetes: <7.0          Failed - Cr in normal range and within 360 days    Creatinine, Ser  Date Value Ref Range Status   09/07/2022 2.13 (H) 0.61 - 1.24 mg/dL Final         Passed - Valid encounter within last 6 months    Recent Outpatient Visits           4 months ago Other insomnia   [facility] Margarita Mail, DO   5 months ago Hypertension, unspecified type   [facility] Margarita Mail, DO       Future Appointments             In 1 month Margarita Mail, DO [place] [facility]   In 1 month End, Cristal Deer, MD [place] [facility]             metolazone (ZAROXOLYN) 2.5 MG tablet [Pharmacy Med Name: metolazone 2.5 mg tablet] 30 tablet 2    Sig: TAKE ONE TABLET BY MOUTH EVERY MORNING take along with torsemide     Cardiovascular:  Diuretics - Thiazide - metolazone Failed - 01/03/2023  8:01 AM      Failed - Cr in normal range and within 180 days    Creatinine, Ser  Date Value Ref Range Status  09/07/2022 2.13 (H) 0.61 - 1.24 mg/dL Final         Passed - Na in normal range and within 180 days    Sodium  Date Value Ref Range Status  09/07/2022 135 135 - 145 mmol/L Final  01/11/2021 137 134 - 144 mmol/L Final         Passed - K in normal range and within 180 days    Potassium  Date Value Ref Range Status  09/07/2022 3.7 3.5 - 5.1 mmol/L Final  Passed - Last BP in normal range    BP Readings from Last 1 Encounters:  09/27/22 132/70         Passed - Valid encounter within last 6 months    Recent Outpatient Visits           4 months ago Other insomnia   [facility] Margarita Mail, DO   5 months ago Hypertension, unspecified type   [facility] Mail, DO       Future Appointments             In 1 month Margarita Mail, DO [place]   In 1 month End, Cristal Deer, MD [place] [facility]             amiodarone (PACERONE) 200 MG tablet  [Pharmacy Med Name: amiodarone 200 mg tablet] 30 tablet 4    Sig: TAKE 1 TABLET BY MOUTH ONCE DAILY.     Not Delegated - Cardiovascular: Antiarrhythmic Agents - amiodarone Failed - 01/03/2023  8:01 AM      Failed - This refill cannot be delegated      Failed - Manual Review: Eye exam recommended every 12 months      Failed - Mg Level in normal range and within 360 days    Magnesium  Date Value Ref Range Status  10/01/2020 2.3 1.7 - 2.4 mg/dL Final    Comment:    Performed at [facility], [address] [place], [place] [address]         Failed - Patient had chest x-ray within the last 6 months      Passed - TSH in normal range and within 360 days    TSH  Date Value Ref Range Status  09/07/2022 0.555 0.350 - 4.500 uIU/mL Final    Comment:    Performed by a 3rd Generation assay with a functional sensitivity of <=0.01 uIU/mL. Performed at [facility], [address] [place], [place] [address]   08/26/2020 14.400 (H) 0.450 - 4.500 uIU/mL Final         Passed - K in normal range and within 180 days    Potassium  Date Value Ref Range Status  09/07/2022 3.7 3.5 - 5.1 mmol/L Final         Passed - AST in normal range and within 180 days    AST  Date Value Ref Range Status  09/07/2022 26 15 - 41 U/L Final         Passed - ALT in normal range and within 180 days    ALT  Date Value Ref Range Status  09/07/2022 24 0 - 44 U/L Final         Passed - Patient had ECG in the last 180 days      Passed - Patient is not pregnant      Passed - Last BP in normal range    BP Readings from Last 1 Encounters:  09/27/22 132/70         Passed - Last Heart Rate in normal range    Pulse Readings from Last 1 Encounters:  09/07/22 86         Passed - Valid encounter within last 6 months    Recent Outpatient Visits           4 months ago Other insomnia   [facility] Marg[facility]ita Mail, DO   5 months  ago Hypertension, unspecified  type   [facility] Margarita Mail, DO       Future Appointments             In 1 month Margarita Mail, DO [facility]   In 1 month End, Cristal Deer, MD [facility]

## 2023-01-05 ENCOUNTER — Other Ambulatory Visit: Payer: Self-pay | Admitting: Internal Medicine

## 2023-01-07 NOTE — Telephone Encounter (Signed)
Requested medications are due for refill today.  unsure  Requested medications are on the active medications list.  no  Last refill. See note  Future visit scheduled.   yes  Notes to clinic.  Request is for 10 mEq. On med list dosage is . Please review.    Requested Prescriptions  Pending Prescriptions Disp Refills   potassium chloride (KLOR-CON) 10 MEQ tablet [Pharmacy Med Name: potassium chloride ER 10 mEq tablet,extended release] 540 tablet 2    Sig: TAKE SIX TABLETS BY MOUTH THREE TIMES DAILY     Endocrinology:  Minerals - Potassium Supplementation Failed - 01/05/2023  8:03 AM      Failed - Cr in normal range and within 360 days    Creatinine, Ser  Date Value Ref Range Status  09/07/2022 2.13 (H) 0.61 - 1.24 mg/dL Final         Passed - K in normal range and within 360 days    Potassium  Date Value Ref Range Status  09/07/2022 3.7 3.5 - 5.1 mmol/L Final         Passed - Valid encounter within last 12 months    Recent Outpatient Visits           4 months ago Other insomnia   [facility] Mail, DO   5 months ago Hypertension, unspecified type   [facility] Margarita Mail, DO       Future Appointments             In 1 month Margarita Mail, DO [place] [facility]   In 1 month End, Cristal Deer, MD [facility]

## 2023-01-13 ENCOUNTER — Other Ambulatory Visit: Payer: Self-pay | Admitting: Internal Medicine

## 2023-01-15 NOTE — Telephone Encounter (Signed)
Requested Prescriptions  Pending Prescriptions Disp Refills   torsemide (DEMADEX) 20 MG tablet [Pharmacy Med Name: torsemide 20 mg tablet] 180 tablet 0    Sig: TAKE TWO TABLETS BY MOUTH TWICE DAILY     Cardiovascular:  Diuretics - Loop Failed - 01/13/2023  8:04 AM      Failed - Cr in normal range and within 180 days    Creatinine, Ser  Date Value Ref Range Status  09/07/2022 2.13 (H) 0.61 - 1.24 mg/dL Final         Failed - Cl in normal range and within 180 days    Chloride  Date Value Ref Range Status  09/07/2022 92 (L) 98 - 111 mmol/L Final         Failed - Mg Level in normal range and within 180 days    Magnesium  Date Value Ref Range Status  10/01/2020 2.3 1.7 - 2.4 mg/dL Final    Comment:    Performed at [facility], [address] [place], [place] [address]         Passed - K in normal range and within 180 days    Potassium  Date Value Ref Range Status  09/07/2022 3.7 3.5 - 5.1 mmol/L Final         Passed - Ca in normal range and within 180 days    Calcium  Date Value Ref Range Status  09/07/2022 9.0 8.9 - 10.3 mg/dL Final         Passed - Na in normal range and within 180 days    Sodium  Date Value Ref Range Status  09/07/2022 135 135 - 145 mmol/L Final  01/11/2021 137 134 - 144 mmol/L Final         Passed - Last BP in normal range    BP Readings from Last 1 Encounters:  09/27/22 132/70         Passed - Valid encounter within last 6 months    Recent Outpatient Visits           5 months ago Other insomnia   [facility] Margarita Mail, DO   6 months ago Hypertension, unspecified type   [facility] Margarita Mail, DO       Future Appointments             In 1 month Margarita Mail, DO [facility]   In 1 month End, Cristal Deer, MD [facility]

## 2023-01-16 ENCOUNTER — Encounter: Payer: Self-pay | Admitting: Podiatry

## 2023-01-16 ENCOUNTER — Ambulatory Visit (INDEPENDENT_AMBULATORY_CARE_PROVIDER_SITE_OTHER): Payer: 59 | Admitting: Podiatry

## 2023-01-16 DIAGNOSIS — B351 Tinea unguium: Secondary | ICD-10-CM | POA: Diagnosis not present

## 2023-01-16 DIAGNOSIS — M79674 Pain in right toe(s): Secondary | ICD-10-CM | POA: Diagnosis not present

## 2023-01-16 DIAGNOSIS — M79675 Pain in left toe(s): Secondary | ICD-10-CM | POA: Diagnosis not present

## 2023-01-18 DIAGNOSIS — H401131 Primary open-angle glaucoma, bilateral, mild stage: Secondary | ICD-10-CM | POA: Diagnosis not present

## 2023-01-18 DIAGNOSIS — E119 Type 2 diabetes mellitus without complications: Secondary | ICD-10-CM | POA: Diagnosis not present

## 2023-01-18 DIAGNOSIS — H2513 Age-related nuclear cataract, bilateral: Secondary | ICD-10-CM | POA: Diagnosis not present

## 2023-01-18 LAB — HM DIABETES EYE EXAM

## 2023-01-20 ENCOUNTER — Encounter: Payer: Self-pay | Admitting: Podiatry

## 2023-01-20 NOTE — Progress Notes (Signed)
  Subjective:  Patient ID: [name], male    DOB: 23-Mar-1951,  MRN: [id number]    72 y.o. male returns for at risk diabetic foot care overall doing well.  Nails are thickened elongated once again.    Review of Systems: Negative except as noted in the HPI. Denies N/V/F/Ch.   Objective:   There were no vitals filed for this visit.  There is no height or weight on file to calculate BMI. Constitutional Well developed. Well nourished.  Vascular Foot warm and well perfused. Capillary refill normal to all digits.   Neurologic Normal speech. Oriented to person, place, and time. Epicritic sensation to light touch grossly reduced bilaterally.  Dermatologic No recurrence of wound or ulceration.  He has thickened elongated toenails x10 bilaterally with subungual brown and yellow discoloration.   Orthopedic: Good smooth range of motion of foot joints    Assessment:   1. Pain due to onychomycosis of toenails of both feet      Plan:  Patient was evaluated and treated and all questions answered.    Discussed the etiology and treatment options for the condition in detail with the patient.  Recommended debridement of the nails today. Sharp and mechanical debridement performed of all painful and mycotic nails today. Nails debrided in length and thickness using a nail nipper to level of comfort. Discussed treatment options including appropriate shoe gear. Follow up as needed for painful nails.         Return in about 3 months (around 04/18/2023).

## 2023-01-21 ENCOUNTER — Other Ambulatory Visit: Payer: Self-pay | Admitting: Internal Medicine

## 2023-01-21 DIAGNOSIS — G4709 Other insomnia: Secondary | ICD-10-CM

## 2023-01-21 DIAGNOSIS — F419 Anxiety disorder, unspecified: Secondary | ICD-10-CM

## 2023-01-22 NOTE — Telephone Encounter (Signed)
Requested Prescriptions  Pending Prescriptions Disp Refills   traZODone (DESYREL) 50 MG tablet [Pharmacy Med Name: trazodone 50 mg tablet] 90 tablet 0    Sig: TAKE ONE TABLET BY MOUTH AT BEDTIME     Psychiatry: Antidepressants - Serotonin Modulator Passed - 01/21/2023  8:30 AM      Passed - Valid encounter within last 6 months    Recent Outpatient Visits           5 months ago Other insomnia   [facility] Mail, DO   6 months ago Hypertension, unspecified type   [facility] Margarita Mail, DO       Future Appointments             In 3 weeks Margarita Mail, DO [place] [facility]   In 1 month End, Cristal Deer, MD [facility]

## 2023-01-30 ENCOUNTER — Telehealth: Payer: Self-pay | Admitting: Internal Medicine

## 2023-01-30 NOTE — Telephone Encounter (Signed)
Copied from CRM [id number]. Topic: General - Other >> Jan 30, 2023 12:01 PM Franchot Heidelberg wrote: Reason for CRM: Pt called reporting that his pharmacy has submitted refill requests, he cannot name the meds that he needs but he know he will be out tomorrow.  [facility] - [place], [place] - [address] [place] [address] Phone: [phone] Fax: [phone]

## 2023-01-30 NOTE — Telephone Encounter (Signed)
Refills sent to provider.

## 2023-02-10 ENCOUNTER — Other Ambulatory Visit: Payer: Self-pay | Admitting: Internal Medicine

## 2023-02-12 ENCOUNTER — Other Ambulatory Visit: Payer: Self-pay | Admitting: Internal Medicine

## 2023-02-12 NOTE — Telephone Encounter (Signed)
Requested medication (s) are due for refill today: Yes  Requested medication (s) are on the active medication list: Yes  Last refill:    Future visit scheduled: Yes  Notes to clinic:  Amiodarone not delegated.    Requested Prescriptions  Pending Prescriptions Disp Refills   amiodarone (PACERONE) 200 MG tablet [Pharmacy Med Name: amiodarone 200 mg tablet] 30 tablet 0    Sig: TAKE ONE TABLET BY MOUTH ONCE DAILY     Not Delegated - Cardiovascular: Antiarrhythmic Agents - amiodarone Failed - 02/10/2023  8:03 AM      Failed - This refill cannot be delegated      Failed - Manual Review: Eye exam recommended every 12 months      Failed - Mg Level in normal range and within 360 days    Magnesium  Date Value Ref Range Status  10/01/2020 2.3 1.7 - 2.4 mg/dL Final    Comment:    Performed at [facility], [address] [place], [place] [address]         Failed - Patient had chest x-ray within the last 6 months      Passed - TSH in normal range and within 360 days    TSH  Date Value Ref Range Status  09/07/2022 0.555 0.350 - 4.500 uIU/mL Final    Comment:    Performed by a 3rd Generation assay with a functional sensitivity of <=0.01 uIU/mL. Performed at [facility], [address] [place], [place] [address]   08/26/2020 14.400 (H) 0.450 - 4.500 uIU/mL Final         Passed - K in normal range and within 180 days    Potassium  Date Value Ref Range Status  09/07/2022 3.7 3.5 - 5.1 mmol/L Final         Passed - AST in normal range and within 180 days    AST  Date Value Ref Range Status  09/07/2022 26 15 - 41 U/L Final         Passed - ALT in normal range and within 180 days    ALT  Date Value Ref Range Status  09/07/2022 24 0 - 44 U/L Final         Passed - Patient had ECG in the last 180 days      Passed - Patient is not pregnant      Passed - Last BP in normal range    BP Readings from Last 1 Encounters:  09/27/22 132/70         Passed -  Last Heart Rate in normal range    Pulse Readings from Last 1 Encounters:  09/07/22 86         Passed - Valid encounter within last 6 months    Recent Outpatient Visits           6 months ago Other insomnia   [facility] Margarita Mail, DO   7 months ago Hypertension, unspecified type   [facility] Margarita Mail, DO       Future Appointments             In 2 days Margarita Mail, DO [facility]   In 2 weeks End, Cristal Deer, MD [place] [facility]             metolazone (ZAROXOLYN) 2.5 MG tablet [Pharmacy Med Name: metolazone 2.5 mg tablet] 30 tablet 0    Sig: TAKE  ONE TABLET BY MOUTH IN THE MORNING, TAKE WITH TORSEMIDE.     Cardiovascular:  Diuretics - Thiazide - metolazone Failed - 02/10/2023  8:03 AM      Failed - Cr in normal range and within 180 days    Creatinine, Ser  Date Value Ref Range Status  09/07/2022 2.13 (H) 0.61 - 1.24 mg/dL Final         Passed - Na in normal range and within 180 days    Sodium  Date Value Ref Range Status  09/07/2022 135 135 - 145 mmol/L Final  01/11/2021 137 134 - 144 mmol/L Final         Passed - K in normal range and within 180 days    Potassium  Date Value Ref Range Status  09/07/2022 3.7 3.5 - 5.1 mmol/L Final         Passed - Last BP in normal range    BP Readings from Last 1 Encounters:  09/27/22 132/70         Passed - Valid encounter within last 6 months    Recent Outpatient Visits           6 months ago Other insomnia   [name] Marg[facility]ita Mail, DO   7 months ago Hypertension, unspecified type   [name] Margarita Mail, DO       Future Appointments             In 2 days Margarita Mail, DO [place] [facility]   In 2 weeks End, Cristal Deer, MD [facility]

## 2023-02-12 NOTE — Progress Notes (Unsigned)
Established Patient Office Visit  Subjective    Patient ID: [name], male    DOB: 07-09-1950  Age: 72 y.o. MRN: [id number]  CC:  No chief complaint on file.   HPI [name] presents to follow up on anxiety and medication changes.   Hypertension/CHF/A.Fib/NICM/OSA: -Medications: Amiodarone 200 mg, Eliquis 5 mg BID, Coreg 12.5 mg BID, Losartan 12.5 mg, Jardiance 10 mg, KCl 40 meQ TID, Torsemide 40 mg daily (extra 20 mg as needed), Metolazone 2.5 mg -Patient is compliant with above medications and reports no side effects. -Checking BP at home (average): not checking -Denies any SOB, CP, vision changes, LE edema or symptoms of hypotension -Following with Cardiology, last seen 01/18/22 -Last echo 9/22 with improved EF to 40-45% -Compliant with CPAP  HLD/Hx of MI/CAD: -Medications: Crestor 20 mg -Patient is compliant with above medications and reports no side effects.  -Had one bare-metal cardiac stent placed in 1998 -Last cardiac catheterization in 2022 with moderate and nonobstructive CAD -Last lipid panel: Lipid Panel     Component Value Date/Time   CHOL 107 03/02/2022 1530   TRIG 386 (H) 03/02/2022 1530   HDL 30 (L) 03/02/2022 1530   CHOLHDL 3.6 03/02/2022 1530   VLDL 77 (H) 03/02/2022 1530   LDLCALC 0 03/02/2022 1530    Diabetes, Type 2: -Follows with Dr. Gershon Crane, last seen 07/27/22 -Last A1c 5.9% 7/24 -Medications: Glipizide 10 mg BID, Jardiance 10 mg, Mounjaro 15.0 mg, Toujeo 100 units daily  -Patient is compliant with the above medications and reports no side effects.  -Checking BG at home: Dexcom 7  -Eye exam: UTD - follows with Ophthalmology at [facility], 2/24, follows with her twice a year. 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   -Foot exam: UTD, follows with podiatry  -Microalbumin: UTD 9/23 -Statin: yes -PNA vaccine: UTD, Prevnar 20 at pharmacy  -Denies symptoms of hypoglycemia, polyuria, polydipsia, numbness  extremities, foot ulcers/trauma.   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 At LOV gave patient weaning plan. Today he states that he feels "terrible" but is successfully off the medication. Unfortunately anxiety is now uncontrolled but he is willing to discuss other treatment options.  -States now that sleep cycle is disruptive and that he is sleeping on and off about 3-4 hours a day.   Health Maintenance: -Blood work UTD   Outpatient Encounter Medications as of 02/14/2023  Medication Sig   amiodarone (PACERONE) 200 MG tablet TAKE 1 TABLET BY MOUTH ONCE DAILY.   Bromelains (BROMELAIN PO) Take 2 capsules by mouth daily.   carvedilol (COREG) 12.5 MG tablet TAKE (1) TABLET TWICE A DAY WITH FOOD---BREAKFAST AND SUPPER.   CINNAMON PO Take 1,200 mg by mouth in the morning and at bedtime. [place] Cinnamon 1200mg    Coenzyme Q10 (COQ10) 400 MG CAPS Take 400 mg by mouth 2 (two) times daily.   docusate sodium (COLACE) 100 MG capsule 2 (two) times daily. Take 300 mg in the pm & 200 mg in the am.   ELIQUIS 5 MG TABS tablet TAKE ONE TABLET BY MOUTH TWICE DAILY   glipiZIDE (GLUCOTROL)  10 MG tablet TAKE ONE TABLET BY MOUTH TWICE DAILY   HAWTHORNE BERRY PO Take 565 mg by mouth at bedtime.   Insulin Glargine (TOUJEO SOLOSTAR [place]) Inject 100 Units into the skin as needed.   JARDIANCE 10 MG TABS tablet Take 10 mg by mouth every evening.    ketoconazole (NIZORAL) 2 % shampoo Apply 1 Application topically 2 (two) times a week.   levothyroxine (SYNTHROID) 50 MCG tablet Take 50 mcg by mouth daily before breakfast.   losartan (COZAAR) 25  MG tablet Take 0.5 tablets (12.5 mg total) by mouth daily.   magnesium oxide (MAG-OX) 400 (240 Mg) MG tablet TAKE ONE TABLET BY MOUTH ONCE DAILY AS NEEDED   MELATONIN-THEANINE PO Take 2 capsules by mouth at bedtime. Olly Sleep Gummies   metolazone (ZAROXOLYN) 2.5 MG tablet TAKE ONE TABLET BY MOUTH EVERY MORNING take along with torsemide   mometasone (ELOCON) 0.1 % cream Apply 1 Application topically daily. To areas of psoriasis   Multiple Vitamins-Minerals (PRESERVISION AREDS 2 PO) Take 1 tablet by mouth in the morning and at bedtime.   potassium chloride (KLOR-CON) 10 MEQ tablet TAKE SIX TABLETS BY MOUTH THREE TIMES DAILY   rosuvastatin (CRESTOR) 20 MG tablet TAKE 1 TABLET BY MOUTH ONCE DAILY.   tirzepatide [facility]) 12.5 MG/0.5ML Pen Inject into the skin once a week.   torsemide (DEMADEX) 20 MG tablet TAKE TWO TABLETS BY MOUTH TWICE DAILY   Torsemide 40 MG TABS Take 40 mg by mouth as directed. Take 40 mg once daily with extra 20 mg as needed for weight greater than 275   traZODone (DESYREL) 50 MG tablet TAKE ONE TABLET BY MOUTH AT BEDTIME   valACYclovir (VALTREX) 1000 MG tablet Take 1,000 mg by mouth daily as needed.   vitamin C (ASCORBIC ACID) 500 MG tablet Take 500 mg by mouth at bedtime.   zinc gluconate 50 MG tablet Take 50 mg by mouth daily.   No facility-administered encounter medications on file as of 02/14/2023.    Past Medical History:  Diagnosis Date   Allergy [phone]   Anxiety    Mild - Intermiteant   Cataract    early stage   CHF (congestive heart failure) (HCC)    CKD (chronic kidney disease) stage 3, GFR 30-59 ml/min (HCC)    Coronary artery disease    a. 1998 s/p ACS Multi-link BMS to the prox LAD ([place], [place]); b. 07/2015 Cath: LM nl, LAD patent stent, LCX min irregs, OM1/2 min irregs, OM3 nl, RCA min irregs; c. 07/2020 Cath: LM nl, LAD 50p/m, 70d, D2 70, LCX 40p/63m, OM3 40, RCA 60d->Med Rx.   Depression    Intermiteant   Diabetes mellitus type 2, uncontrolled     a. A1c 11.0 in 07/2015.   Glaucoma    Resolved with laser   HFrEF    a. EF 25% by cath in 2013; b. Echo in 07/2015 showing EF of 15-20%, moderate MR, moderate Pulm HTN, severely dilated IVC; c. 11/2016 Echo: EF 55-60%, no rwma, Gr1 DD, mildly dil LA, nl RV fxn. d. 12/2019 Echo: EF 40-45%, e. 07/2020 Echo: EF 20-25%, sev red RV fxx; f. 02/2021 Echo: EF 40-45%, glob HK/signif sept HK. Nl RV fxn.   History of kidney stones    Hypertension    Ischemic cardiomyopathy    Kidney stones    Left   Myocardial infarction [facility])    Paroxysmal atrial fibrillation (HCC)    a. Dx 07/2015; b. CHA2DS2VASc = 5-->eliquis. Rhythm control  w/ amiodarone.   Pollen allergies 09/21/2015   pt called and stated that he woke up and had some drainage-pt states he did not see the color of the drainage-pt denies running a fever and this only happened once-pt instructed to call Dr Assunta Gambles office if he starts running a fever or if the color of drainage becomes yellow/green   Psoriasis    Pulmonary hypertension (HCC)    Sleep apnea     Past Surgical History:  Procedure Laterality Date   CARDIAC CATHETERIZATION N/A 07/25/2015   Procedure: Right/Left Heart Cath and Coronary Angiography;  Surgeon: Iran Ouch, MD;  Location: [facility] INVASIVE CV LAB;  Service: Cardiovascular;  Laterality: N/A;   CARDIAC CATHE[place]ATION     [place]   CARDIAC CATHETERIZATION     [place], [place]   CORONARY ANGIOPLASTY WITH STENT PLACEMENT     CYSTOSCOPY WITH STENT PLACEMENT Left 09/07/2015   Procedure: CYSTOSCOPY WITH STENT PLACEMENT;  Surgeon: Vanna Scotland, MD;  Location: [facility] ORS;  Service: Urology;  Laterality: Left;   CYSTOSCOPY/URETEROSCOPY/HOLMIUM LASER/STENT PLACEMENT Left 10/25/2015   Procedure: CYSTOSCOPY/URETEROSCOPY/HOLMIUM LASER/STENT EXCHANGE;  Surgeon: Vanna Scotland, MD;  Location: [facility] ORS;  Service: Urology;  Laterality: Left;   CYSTOSCOPY/URETEROSCOPY/HOLMIUM LASER/STENT PLACEMENT Left 05/11/2019   Procedure:  CYSTOSCOPY/URETEROSCOPY/HOLMIUM LASER/STENT PLACEMENT;  Surgeon: Vanna Scotland, MD;  Location: [facility] ORS;  Service: Urology;  Laterality: Left;   EXTRACORPOREAL SHOCK WAVE LITHOTRIPSY Left 04/02/2019   Procedure: EXTRACORPOREAL SHOCK WAVE LITHOTRIPSY (ESWL);  Surgeon: Vanna Scotland, MD;  Location: [facility] ORS;  Service: Urology;  Laterality: Left;   EXTRACORPOREAL SHOCK WAVE LITHOTRIPSY Left 03/05/2019   Procedure: EXTRACORPOREAL SHOCK WAVE LITHOTRIPSY (ESWL);  Surgeon: Riki Altes, MD;  Location: [facility] ORS;  Service: Urology;  Laterality: Left;   EYE SURGERY Bilateral    LASER FOR GLAUCOMA   IRRIGATION AND DEBRIDEMENT FOOT Left 09/30/2020   Procedure: IRRIGATION AND DEBRIDEMENT FOOT;  Surgeon: Edwin Cap, DPM;  Location: [facility] ORS;  Service: Podiatry;  Laterality: Left;   KIDNEY SURGERY     LOWER EXTREMITY ANGIOGRAPHY Left 10/04/2020   Procedure: Lower Extremity Angiography;  Surgeon: Renford Dills, MD;  Location: [facility];  Service: Cardiovascular;  Laterality: Left;   NEPHROSTOMY TUBE PLACEMENT ([facility] HX) Left 11/2019   RIGHT/LEFT HEART CATH AND CORONARY ANGIOGRAPHY N/A 07/12/2020   Procedure: RIGHT/LEFT HEART CATH AND CORONARY ANGIOGRAPHY;  Surgeon: Yvonne Kendall, MD;  Location: [facility] CV LAB;  Service: Cardiovascular;  Laterality: N/A;   ROBOT ASSISTED LAPAROSCOPIC NEPHRECTOMY Left 12/10/2019   Procedure: XI ROBOTIC ASSISTED LAPAROSCOPIC NEPHRECTOMY;  Surgeon: Vanna Scotland, MD;  Location: [facility];  Service: Urology;  Laterality: Left;   URETEROSCOPY WITH HOLMIUM LASER LITHOTRIPSY Left 09/07/2015   Procedure: URETEROSCOPY WITH HOLMIUM LASER LITHOTRIPSY;  Surgeon: Vanna Scotland, MD;  Location: [facility] OR[facility];  Service: Urology;  Laterality: Left;    Family History  Problem Relation Age of Onset   Heart failure Father    Arthritis Father    Heart disease Father    Hypertension Father    Heart attack Brother    Diabetes Brother    Heart disease Brother     Hypertension Brother    Birth defects Sister    Early death Sister    Early death Brother    Heart disease Brother    Hypertension Brother    Kidney cancer Neg Hx    Bladder Cancer Neg Hx    Prostate cancer Neg Hx     Social History   Socioeconomic History  Marital status: Single    Spouse name: Not on file   Number of children: Not on file   Years of education: Not on file   Highest education level: Not on file  Occupational History   Not on file  Tobacco Use   Smoking status: Never   Smokeless tobacco: Never  Vaping Use   Vaping status: Never Used  Substance and Sexual Activity   Alcohol use: Yes    Comment: less than 1   Drug use: Yes    Types: Cocaine, LSD, Marijuana, Opium    Comment: with the exception of marijuana nothing used in 30 years!   Sexual activity: Not Currently    Birth control/protection: Condom  Other Topics Concern   Not on file  Social History Narrative   Not on file   Social Determinants of Health   Financial Resource Strain: Low Risk  (09/27/2022)   Overall Financial Resource Strain (CARDIA)    Difficulty of Paying Living Expenses: Not hard at all  Food Insecurity: No Food Insecurity (09/27/2022)   Hunger Vital Sign    Worried About Running Out of Food in the Last Year: Never true    Ran Out of Food in the Last Year: Never true  Transportation Needs: No Transportation Needs (09/27/2022)   PRAPARE - Administrator, Civil Service (Medical): No    Lack of Transportation (Non-Medical): No  Physical Activity: Inactive (09/27/2022)   Exercise Vital Sign    Days of Exercise per Week: 0 days    Minutes of Exercise per Session: 0 min  Stress: No Stress Concern Present (09/27/2022)   [employer] of Occupational Health - Occupational Stress Questionnaire    Feeling of Stress : Only a little  Social Connections: Moderately Isolated (09/27/2022)   Social Connection and Isolation Panel [NHANES]    Frequency of Communication with  Friends and Family: More than three times a week    Frequency of Social Gatherings with Friends and Family: Never    Attends Religious Services: Never    Database administrator or Organizations: No    Attends Banker Meetings: Never    Marital Status: Married  Catering manager Violence: Not At Risk (09/27/2022)   Humiliation, Afraid, Rape, and Kick questionnaire    Fear of Current or Ex-Partner: No    Emotionally Abused: No    Physically Abused: No    Sexually Abused: No    Review of Systems  Constitutional:  Negative for chills and fever.  Eyes:  Negative for blurred vision.  Respiratory:  Negative for shortness of breath.   Cardiovascular:  Negative for chest pain.  Gastrointestinal:  Negative for abdominal pain.  Psychiatric/Behavioral:  The patient is nervous/anxious and has insomnia.       Objective    There were no vitals taken for this visit.  Physical Exam Constitutional:      Appearance: Normal appearance.  HENT:     Head: Normocephalic and atraumatic.  Eyes:     Conjunctiva/sclera: Conjunctivae normal.  Cardiovascular:     Rate and Rhythm: Normal rate and regular rhythm.  Pulmonary:     Effort: Pulmonary effort is normal.     Breath sounds: Normal breath sounds.  Musculoskeletal:     Right lower leg: No edema.     Left lower leg: No edema.  Skin:    General: Skin is warm and dry.  Neurological:     General: No focal deficit present.  Mental Status: [name] alert. Mental status is at baseline.  Psychiatric:        Mood and Affect: Mood normal.        Behavior: Behavior normal.         Assessment & Plan:   1. Anxiety/Other insomnia: Off Klonopin, start Trazodone 50 mg at night for sleep and anxiety. Follow up in 6 months or sooner as needed.   - traZODone (DESYREL) 50 MG tablet; Take 1 tablet (50 mg total) by mouth at bedtime.  Dispense: 30 tablet; Refill: 3   No follow-ups on file.   Margarita Mail, DO

## 2023-02-13 NOTE — Telephone Encounter (Signed)
Requested medication (s) are due for refill today: Due 03/03/23  Requested medication (s) are on the active medication list: yes    Last refill: 01/31/23  #30  0 refills  Future visit scheduled yes 02/14/23  Notes to clinic:Not delegated, please review. Thank you.  Requested Prescriptions  Pending Prescriptions Disp Refills   amiodarone (PACERONE) 200 MG tablet [Pharmacy Med Name: amiodarone 200 mg tablet] 30 tablet 0    Sig: TAKE ONE TABLET BY MOUTH ONCE DAILY     Not Delegated - Cardiovascular: Antiarrhythmic Agents - amiodarone Failed - 02/12/2023  2:40 PM      Failed - This refill cannot be delegated      Failed - Manual Review: Eye exam recommended every 12 months      Failed - Mg Level in normal range and within 360 days    Magnesium  Date Value Ref Range Status  10/01/2020 2.3 1.7 - 2.4 mg/dL Final    Comment:    Performed at [facility], [address] [place], [place] [address]         Failed - Patient had chest x-ray within the last 6 months      Passed - TSH in normal range and within 360 days    TSH  Date Value Ref Range Status  09/07/2022 0.555 0.350 - 4.500 uIU/mL Final    Comment:    Performed by a 3rd Generation assay with a functional sensitivity of <=0.01 uIU/mL. Performed at [facility], [address] [place], [place] [address]   08/26/2020 14.400 (H) 0.450 - 4.500 uIU/mL Final         Passed - K in normal range and within 180 days    Potassium  Date Value Ref Range Status  09/07/2022 3.7 3.5 - 5.1 mmol/L Final         Passed - AST in normal range and within 180 days    AST  Date Value Ref Range Status  09/07/2022 26 15 - 41 U/L Final         Passed - ALT in normal range and within 180 days    ALT  Date Value Ref Range Status  09/07/2022 24 0 - 44 U/L Final         Passed - Patient had ECG in the last 180 days      Passed - Patient is not pregnant      Passed - Last BP in normal range    BP Readings from Last 1  Encounters:  09/27/22 132/70         Passed - Last Heart Rate in normal range    Pulse Readings from Last 1 Encounters:  09/07/22 86         Passed - Valid encounter within last 6 months    Recent Outpatient Visits           6 months ago Other insomnia   [facility] Margarita Mail, DO   7 months ago Hypertension, unspecified type   [facility] Margarita Mail, DO       Future Appointments             Tomorrow Margarita Mail, DO [facility]   In 2 weeks End, Cristal Deer, MD [facility]             metolazone (ZAROXOLYN) 2.5 MG tablet [Pharmacy Med Name: metolazone 2.5 mg tablet] 30 tablet 0  Sig: TAKE ONE TABLET BY MOUTH IN THE MORNING, TAKE WITH TORSEMIDE.     Cardiovascular:  Diuretics - Thiazide - metolazone Failed - 02/12/2023  2:40 PM      Failed - Cr in normal range and within 180 days    Creatinine, Ser  Date Value Ref Range Status  09/07/2022 2.13 (H) 0.61 - 1.24 mg/dL Final         Passed - Na in normal range and within 180 days    Sodium  Date Value Ref Range Status  09/07/2022 135 135 - 145 mmol/L Final  01/11/2021 137 134 - 144 mmol/L Final         Passed - K in normal range and within 180 days    Potassium  Date Value Ref Range Status  09/07/2022 3.7 3.5 - 5.1 mmol/L Final         Passed - Last BP in normal range    BP Readings from Last 1 Encounters:  09/27/22 132/70         Passed - Valid encounter within last 6 months    Recent Outpatient Visits           6 months ago Other insomnia   [facility] Margarita Mail, DO   7 months ago Hypertension, unspecified type   [facility] Margarita Mail, DO       Future Appointments             Tomorrow Margarita Mail, DO [place] [facility], [facility]   In 2 weeks End, Cristal Deer, MD [facility] at [facility]

## 2023-02-14 ENCOUNTER — Other Ambulatory Visit: Payer: Self-pay | Admitting: Internal Medicine

## 2023-02-14 ENCOUNTER — Encounter: Payer: Self-pay | Admitting: Internal Medicine

## 2023-02-14 ENCOUNTER — Other Ambulatory Visit: Payer: Self-pay | Admitting: Nurse Practitioner

## 2023-02-14 ENCOUNTER — Ambulatory Visit (INDEPENDENT_AMBULATORY_CARE_PROVIDER_SITE_OTHER): Payer: 59 | Admitting: Internal Medicine

## 2023-02-14 VITALS — BP 110/64 | HR 92 | Temp 97.3°F | Ht 72.0 in | Wt 240.7 lb

## 2023-02-14 DIAGNOSIS — I480 Paroxysmal atrial fibrillation: Secondary | ICD-10-CM

## 2023-02-14 DIAGNOSIS — I10 Essential (primary) hypertension: Secondary | ICD-10-CM

## 2023-02-14 DIAGNOSIS — I5022 Chronic systolic (congestive) heart failure: Secondary | ICD-10-CM

## 2023-02-14 DIAGNOSIS — G4709 Other insomnia: Secondary | ICD-10-CM | POA: Diagnosis not present

## 2023-02-14 DIAGNOSIS — E039 Hypothyroidism, unspecified: Secondary | ICD-10-CM | POA: Diagnosis not present

## 2023-02-14 DIAGNOSIS — Z7984 Long term (current) use of oral hypoglycemic drugs: Secondary | ICD-10-CM

## 2023-02-14 DIAGNOSIS — F419 Anxiety disorder, unspecified: Secondary | ICD-10-CM

## 2023-02-14 DIAGNOSIS — I2510 Atherosclerotic heart disease of native coronary artery without angina pectoris: Secondary | ICD-10-CM

## 2023-02-14 DIAGNOSIS — E118 Type 2 diabetes mellitus with unspecified complications: Secondary | ICD-10-CM | POA: Diagnosis not present

## 2023-02-14 DIAGNOSIS — F4321 Adjustment disorder with depressed mood: Secondary | ICD-10-CM

## 2023-02-14 MED ORDER — LOSARTAN POTASSIUM 25 MG PO TABS
12.5000 mg | ORAL_TABLET | Freq: Every day | ORAL | 2 refills | Status: DC
Start: 2023-02-14 — End: 2023-07-22

## 2023-02-14 MED ORDER — TIRZEPATIDE 12.5 MG/0.5ML ~~LOC~~ SOAJ
12.5000 mg | SUBCUTANEOUS | 1 refills | Status: DC
Start: 2023-02-14 — End: 2024-02-14

## 2023-02-14 MED ORDER — JARDIANCE 10 MG PO TABS
10.0000 mg | ORAL_TABLET | Freq: Every evening | ORAL | 1 refills | Status: DC
Start: 2023-02-14 — End: 2024-03-25

## 2023-02-14 MED ORDER — AMIODARONE HCL 200 MG PO TABS
200.0000 mg | ORAL_TABLET | Freq: Every day | ORAL | 1 refills | Status: DC
Start: 2023-02-14 — End: 2023-06-19

## 2023-02-14 MED ORDER — CARVEDILOL 12.5 MG PO TABS
ORAL_TABLET | ORAL | 1 refills | Status: DC
Start: 2023-02-14 — End: 2023-02-28

## 2023-02-14 MED ORDER — APIXABAN 5 MG PO TABS
5.0000 mg | ORAL_TABLET | Freq: Two times a day (BID) | ORAL | 5 refills | Status: DC
Start: 2023-02-14 — End: 2023-07-22

## 2023-02-14 MED ORDER — TRAZODONE HCL 50 MG PO TABS
50.0000 mg | ORAL_TABLET | Freq: Every day | ORAL | 1 refills | Status: DC
Start: 2023-02-14 — End: 2023-08-15

## 2023-02-14 MED ORDER — ROSUVASTATIN CALCIUM 20 MG PO TABS
20.0000 mg | ORAL_TABLET | Freq: Every day | ORAL | 1 refills | Status: DC
Start: 2023-02-14 — End: 2023-08-02

## 2023-02-14 MED ORDER — GLIPIZIDE 10 MG PO TABS
10.0000 mg | ORAL_TABLET | Freq: Two times a day (BID) | ORAL | 5 refills | Status: DC
Start: 2023-02-14 — End: 2023-07-22

## 2023-02-14 MED ORDER — LEVOTHYROXINE SODIUM 50 MCG PO TABS
50.0000 ug | ORAL_TABLET | Freq: Every day | ORAL | 1 refills | Status: AC
Start: 2023-02-14 — End: ?

## 2023-02-14 NOTE — Telephone Encounter (Signed)
Medication Refill - Medication: metolazone (ZAROXOLYN) 2.5 MG tablet [[id number]] , amiodarone (PACERONE) 200 MG tablet [[id number]], Torsemide 40 MG TABS [[id number]], tirzepatide (MOUNJARO) 15 MG/0.5ML Pen [[id number]]   Has the patient contacted their pharmacy? Yes.     (Agent: If yes, when and what did the pharmacy advise?) Pharmacy calling in refill   Preferred Pharmacy (with phone number or street name): [facility] - [place], [place] - [address]   Has the patient been seen for an appointment in the last year OR does the patient have an upcoming appointment? Yes.    Agent: Please be advised that RX refills may take up to 3 business days. We ask that you follow-up with your pharmacy.

## 2023-02-14 NOTE — Telephone Encounter (Signed)
Requested Prescriptions  Pending Prescriptions Disp Refills   metolazone (ZAROXOLYN) 2.5 MG tablet [Pharmacy Med Name: metolazone 2.5 mg tablet] 90 tablet 1    Sig: TAKE ONE TABLET BY MOUTH IN THE MORNING, TAKE WITH TORSEMIDE.     Cardiovascular:  Diuretics - Thiazide - metolazone Failed - 02/14/2023  4:43 PM      Failed - Cr in normal range and within 180 days    Creatinine, Ser  Date Value Ref Range Status  09/07/2022 2.13 (H) 0.61 - 1.24 mg/dL Final         Passed - Na in normal range and within 180 days    Sodium  Date Value Ref Range Status  09/07/2022 135 135 - 145 mmol/L Final  01/11/2021 137 134 - 144 mmol/L Final         Passed - K in normal range and within 180 days    Potassium  Date Value Ref Range Status  09/07/2022 3.7 3.5 - 5.1 mmol/L Final         Passed - Last BP in normal range    BP Readings from Last 1 Encounters:  02/14/23 110/64         Passed - Valid encounter within last 6 months    Recent Outpatient Visits           Today Hypertension, unspecified type   [facility] Margarita Mail, DO   6 months ago Other insomnia   [facility] Margarita Mail, DO   7 months ago Hypertension, unspecified type   [facility] Margarita Mail, DO       Future Appointments             In 1 week End, Cristal Deer, MD [facility] [place]   In 6 months Margarita Mail, DO [facility], [facility]             torsemide (DEMADEX) 20 MG tablet [Pharmacy Med Name: torsemide 20 mg tablet] 180 tablet 1    Sig: TAKE TWO TABLETS BY MOUTH TWICE DAILY     Cardiovascular:  Diuretics - Loop Failed - 02/14/2023  4:43 PM      Failed - Cr in normal range and within 180 days    Creatinine, Ser  Date Value Ref Range Status  09/07/2022 2.13 (H) 0.61 - 1.24 mg/dL Final         Failed - Cl in normal range and within 180 days    Chloride  Date  Value Ref Range Status  09/07/2022 92 (L) 98 - 111 mmol/L Final         Failed - Mg Level in normal range and within 180 days    Magnesium  Date Value Ref Range Status  10/01/2020 2.3 1.7 - 2.4 mg/dL Final    Comment:    Performed at [facility], [address] [place], [place] [address]         Passed - K in normal range and within 180 days    Potassium  Date Value Ref Range Status  09/07/2022 3.7 3.5 - 5.1 mmol/L Final         Passed - Ca in normal range and within 180 days    Calcium  Date Value Ref Range Status  09/07/2022 9.0 8.9 - 10.3 mg/dL Final         Passed - Na in normal range and within 180 days    Sodium  Date Value Ref Range Status  09/07/2022 135 135 - 145 mmol/L Final  01/11/2021 137 134 - 144 mmol/L Final         Passed - Last BP in normal range    BP Readings from Last 1 Encounters:  02/14/23 110/64         Passed - Valid encounter within last 6 months    Recent Outpatient Visits           Today Hypertension, unspecified type   [facility] Margarita Mail, DO   6 months ago Other insomnia   [facility] Margarita Mail, DO   7 months ago Hypertension, unspecified type   [facility] Margarita Mail, DO       Future Appointments             In 1 week End, Cristal Deer, MD [facility] at [place]   In 6 months Margarita Mail, DO [facility], [facility]            Refused Prescriptions Disp Refills   amiodarone (PACERONE) 200 MG tablet [Pharmacy Med Name: amiodarone 200 mg tablet] 30 tablet 0    Sig: TAKE ONE TABLET BY MOUTH ONCE DAILY     Not Delegated - Cardiovascular: Antiarrhythmic Agents - amiodarone Failed - 02/14/2023  4:43 PM      Failed - This refill cannot be delegated      Failed - Manual Review: Eye exam recommended every 12 months      Failed - Mg Level in normal range and within 360  days    Magnesium  Date Value Ref Range Status  10/01/2020 2.3 1.7 - 2.4 mg/dL Final    Comment:    Performed at [facility], [address] [place], [place] [address]         Failed - Patient had chest x-ray within the last 6 months      Passed - TSH in normal range and within 360 days    TSH  Date Value Ref Range Status  09/07/2022 0.555 0.350 - 4.500 uIU/mL Final    Comment:    Performed by a 3rd Generation assay with a functional sensitivity of <=0.01 uIU/mL. Performed at [facility], [address] [place], [place] [address]   08/26/2020 14.400 (H) 0.450 - 4.500 uIU/mL Final         Passed - K in normal range and within 180 days    Potassium  Date Value Ref Range Status  09/07/2022 3.7 3.5 - 5.1 mmol/L Final         Passed - AST in normal range and within 180 days    AST  Date Value Ref Range Status  09/07/2022 26 15 - 41 U/L Final         Passed - ALT in normal range and within 180 days    ALT  Date Value Ref Range Status  09/07/2022 24 0 - 44 U/L Final         Passed - Patient had ECG in the last 180 days      Passed - Patient is not pregnant      Passed - Last BP in normal range    BP Readings from Last 1 Encounters:  02/14/23 110/64         Passed - Last Heart Rate in normal range    Pulse Readings from Last 1 Encounters:  02/14/23 92  Passed - Valid encounter within last 6 months    Recent Outpatient Visits           Today Hypertension, unspecified type   [facility] Margarita Mail, DO   6 months ago Other insomnia   [facility] Margarita Mail, DO   7 months ago Hypertension, unspecified type   [facility] Margarita Mail, DO       Future Appointments             In 1 week End, Cristal Deer, MD [facility] [place]   In 6 months Margarita Mail, DO [facility]

## 2023-02-14 NOTE — Telephone Encounter (Addendum)
Already ordered today in a separate refill encounter: Amiodarone, Moundaro, and Torsemide. Metalozone already requested today by the pharmacy in a separate refill encounter.

## 2023-02-14 NOTE — Telephone Encounter (Signed)
Already ordered amiodarone 02/14/23, refusing this request.

## 2023-02-15 ENCOUNTER — Other Ambulatory Visit: Payer: Self-pay | Admitting: Internal Medicine

## 2023-02-18 ENCOUNTER — Other Ambulatory Visit: Payer: Self-pay | Admitting: Internal Medicine

## 2023-02-18 NOTE — Telephone Encounter (Signed)
Requested medication (s) are due for refill today:   Not sure  Requested medication (s) are on the active medication list:   Yes  Future visit scheduled:   Yes 08/15/2023   Last ordered: 02/14/2023 6 ml, 1 refill  Returned because there is not a protocol assigned to this med.  Duplicate request.     Requested Prescriptions  Pending Prescriptions Disp Refills   MOUNJARO 15 MG/0.5ML Pen [Pharmacy Med Name: Mounjaro 15 mg/0.5 mL subcutaneous pen injector] 2 mL 5    Sig: INJECT 15MG  SUBCUTANEOUSLY ONCE A WEEK     Off-Protocol Failed - 02/15/2023 10:43 AM      Failed - Medication not assigned to a protocol, review manually.      Passed - Valid encounter within last 12 months    Recent Outpatient Visits           4 days ago Hypertension, unspecified type   [facility] Margarita Mail, DO   6 months ago Other insomnia   [facility] Margarita Mail, DO   7 months ago Hypertension, unspecified type   [facility] Margarita Mail, DO       Future Appointments             In 1 week End, Cristal Deer, MD [facility] at [place]   In 5 months Margarita Mail, DO [facility], [facility]

## 2023-02-19 NOTE — Telephone Encounter (Signed)
Requested medications are due for refill today.  unsure  Requested medications are on the active medications list.  no  Last refill. none  Future visit scheduled.   yes  Notes to clinic.  Request is for 15mg , on med sheet pt is taking 12.5. Last refill of 12.5 was 02/14/2023 6mL 1 rf. Please review for refill.    Requested Prescriptions  Pending Prescriptions Disp Refills   MOUNJARO 15 MG/0.5ML Pen [Pharmacy Med Name: Mounjaro 15 mg/0.5 mL subcutaneous pen injector] 2 mL 5    Sig: INJECT 15MG  SUBCUTANEOUSLY ONCE A WEEK     Off-Protocol Failed - 02/18/2023  2:39 PM      Failed - Medication not assigned to a protocol, review manually.      Passed - Valid encounter within last 12 months    Recent Outpatient Visits           5 days ago Hypertension, unspecified type   [facility] Margarita Mail, DO   6 months ago Other insomnia   [facility] Margarita Mail, DO   7 months ago Hypertension, unspecified type   [facility] Margarita Mail, DO       Future Appointments             In 1 week End, Cristal Deer, MD [facility] at [place]   In 5 months Margarita Mail, DO [facility] Health [facility], [facility]

## 2023-02-27 ENCOUNTER — Encounter: Payer: Self-pay | Admitting: Internal Medicine

## 2023-02-27 ENCOUNTER — Other Ambulatory Visit
Admission: RE | Admit: 2023-02-27 | Discharge: 2023-02-27 | Disposition: A | Discharge status: Home / Self Care | Payer: 59 | Attending: Internal Medicine | Admitting: Internal Medicine

## 2023-02-27 ENCOUNTER — Ambulatory Visit: Payer: 59 | Attending: Internal Medicine | Admitting: Internal Medicine

## 2023-02-27 VITALS — BP 100/60 | HR 74 | Ht 72.0 in | Wt 239.8 lb

## 2023-02-27 DIAGNOSIS — E1169 Type 2 diabetes mellitus with other specified complication: Secondary | ICD-10-CM | POA: Diagnosis not present

## 2023-02-27 DIAGNOSIS — N184 Chronic kidney disease, stage 4 (severe): Secondary | ICD-10-CM | POA: Diagnosis not present

## 2023-02-27 DIAGNOSIS — I453 Trifascicular block: Secondary | ICD-10-CM | POA: Diagnosis not present

## 2023-02-27 DIAGNOSIS — I5022 Chronic systolic (congestive) heart failure: Secondary | ICD-10-CM | POA: Diagnosis not present

## 2023-02-27 DIAGNOSIS — E785 Hyperlipidemia, unspecified: Secondary | ICD-10-CM

## 2023-02-27 DIAGNOSIS — Z79899 Other long term (current) drug therapy: Secondary | ICD-10-CM | POA: Diagnosis not present

## 2023-02-27 DIAGNOSIS — I2510 Atherosclerotic heart disease of native coronary artery without angina pectoris: Secondary | ICD-10-CM

## 2023-02-27 DIAGNOSIS — I480 Paroxysmal atrial fibrillation: Secondary | ICD-10-CM

## 2023-02-27 LAB — CBC
HCT: 37.3 % — ABNORMAL LOW (ref 39.0–52.0)
Hemoglobin: 12.5 g/dL — ABNORMAL LOW (ref 13.0–17.0)
MCH: 32.3 pg (ref 26.0–34.0)
MCHC: 33.5 g/dL (ref 30.0–36.0)
MCV: 96.4 fL (ref 80.0–100.0)
Platelets: 168 10*3/uL (ref 150–400)
RBC: 3.87 MIL/uL — ABNORMAL LOW (ref 4.22–5.81)
RDW: 14.5 % (ref 11.5–15.5)
WBC: 7 10*3/uL (ref 4.0–10.5)
nRBC: 0 % (ref 0.0–0.2)

## 2023-02-27 LAB — LIPID PANEL
Cholesterol: 138 mg/dL (ref 0–200)
HDL: 32 mg/dL — ABNORMAL LOW (ref >40)
LDL Cholesterol: 40 mg/dL (ref 0–99)
Total CHOL/HDL Ratio: 4.3 RATIO
Triglycerides: 329 mg/dL — ABNORMAL HIGH (ref <150)
VLDL: 66 mg/dL — ABNORMAL HIGH (ref 0–40)

## 2023-02-27 LAB — COMPREHENSIVE METABOLIC PANEL
ALT: 21 U/L (ref 0–44)
AST: 22 U/L (ref 15–41)
Albumin: 3.9 g/dL (ref 3.5–5.0)
Alkaline Phosphatase: 54 U/L (ref 38–126)
Anion gap: 13 (ref 5–15)
BUN: 49 mg/dL — ABNORMAL HIGH (ref 8–23)
CO2: 29 mmol/L (ref 22–32)
Calcium: 9.6 mg/dL (ref 8.9–10.3)
Chloride: 98 mmol/L (ref 98–111)
Creatinine, Ser: 2.36 mg/dL — ABNORMAL HIGH (ref 0.61–1.24)
GFR, Estimated: 29 mL/min — ABNORMAL LOW (ref >60)
Glucose, Bld: 66 mg/dL — ABNORMAL LOW (ref 70–99)
Potassium: 3.9 mmol/L (ref 3.5–5.1)
Sodium: 140 mmol/L (ref 135–145)
Total Bilirubin: 0.6 mg/dL (ref 0.3–1.2)
Total Protein: 8 g/dL (ref 6.5–8.1)

## 2023-02-27 LAB — TSH: TSH: 0.224 u[IU]/mL — ABNORMAL LOW (ref 0.350–4.500)

## 2023-02-27 NOTE — Progress Notes (Unsigned)
Cardiology Office Note:  .   Date:  02/28/2023  ID:  Chryl Heck, DOB 08-18-1950, MRN [id number] PCP: Margarita Mail, DO  [facility] Providers Cardiologist:  Yvonne Kendall, MD     History of Present Illness: .   [name] is a 72 y.o. 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  He was last seen in March by Ward Givens, NP, at which time he was doing well from a heart failure standpoint.  He was no longer requiring additional afternoon doses of torsemide to maintain his weight.  He reported that his housemate was recently diagnosed with stage IV pancreatic cancer, which had taken a toll on him.  His weight was down 16 pounds since 01/2022, likely due to a combination of stress as well as addition of GLP-1 agonist.  Today, [name] reports that he has been feeling fairly well.  He reports that his longtime roommate passed away in 12/20/22.  [name] was his primary caregiver during his Arnita Koons-of-life care and has still been working to settle his estate.  On account of this, his diet has changed such that he has continued to lose weight.  [name] feeling better as his weight continues to drop.  [name] currently only using torsemide once a day, as he feels like this is controlling his fluid well.  [name] also only taking metolazone 3 times a week to maintain his volume status.  His chronic exertional dyspnea is stable.  He denies chest pain, palpitations, and lightheadedness.  ROS: See HPI  Studies Reviewed: [name]   EKG Interpretation Date/Time:  Wednesday February 27 2023 16:25:08 EDT Ventricular Rate:   74 PR Interval:  250 QRS Duration:  168 QT Interval:  470 QTC Calculation: 521 R Axis:   -86  Text Interpretation: Sinus rhythm with 1st degree A-V block Right bundle branch block Left anterior fascicular block Bifascicular block Possible Lateral infarct (cited on or before 10-Apr-2019) When compared with ECG of 29-Aug-2022 No significant change was found Confirmed by Gayna Braddy [phone]) on 02/28/2023 7:43:21 AM    Risk Assessment/Calculations:    CHA2DS2-VASc Score = 5   This indicates a 7.2% annual risk of stroke. The patient's score is based upon: CHF History: 1 HTN History: 1 Diabetes History: 1 Stroke History: 0 Vascular Disease History: 1 Age Score: 1 Gender Score: 0            Physical Exam:   VS:  BP 100/60 (BP Location: Left Arm, Patient Position: Sitting, Cuff Size: Normal)   Pulse 74   Ht 6' (1.829 m)   Wt 239 lb 12.8 oz (108.8 kg)   SpO2 98%   BMI 32.52 kg/m    Wt Readings from Last 3 Encounters:  02/27/23 239 lb 12.8 oz (108.8 kg)  02/14/23 240 lb 11.2 oz (109.2 kg)  09/27/22 255 lb 14.4 oz (116.1 kg)    General:  NAD. Neck: Unable to assess HJR due to facial hair. Lungs: Clear to auscultation bilaterally without wheezes or crackles. Heart: Regular rate and rhythm without murmurs, rubs, or gallops. Abdomen: Soft, nontender, nondistended. Extremities: Trace pretibial edema bilaterally with varicose veins noted.  ASSESSMENT AND PLAN: .  Chronic HFrEF: [name] seems to be controlling his volume status well and is currently only using torsemide once a day and metolazone 3 days a week.  I suspect his weight loss through diet changes is contributing to better volume management as well.  We will continue his current diuretic regimen with [name] able to titrate based on his weight and edema.  Continue his current GDMT consisting of carvedilol, empagliflozin, and losartan.  I am reluctant to escalate therapy or add an aldosterone antagonist at this time  given his persistent borderline low blood pressure as well as underlying CKD.  I will check a CMP today.  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  Though ongoing beta-blocker therapy in the setting is not ideal, given stability of his conduction disease and lack of symptoms suggesting high-grade AV block, I am reluctant to stop it altogether.  We will decrease carvedilol to 6.25 mg twice daily and reassess his PR interval when he returns for follow-up in 6 months.  Coronary artery disease: No angina reported.  Continue apixaban and lieu of aspirin for secondary prevention as well as rosuvastatin.  I will check a CMP and lipid panel today.  Hyperlipidemia associated with type 2 diabetes mellitus: Continue rosuvastatin with follow-up CMP and lipid panel today.  Ongoing management of DM per Dr. Gershon Crane.  Paroxysmal atrial fibrillation: No signs or symptoms to suggest recurrent atrial fibrillation.  EKG today again shows sinus rhythm.  We will plan to continue apixaban 5 mg twice daily as well as amiodarone 200 mg daily.  We will decrease carvedilol, as above.  I will check a CMP, CBC, and TSH today.  Chronic kidney disease stage IV: Check CMP today and continue close outpatient follow-up with nephrology.  Avoid nephrotoxic drugs.    Dispo: Return to clinic in 6 months.  Signed, Yvonne Kendall, MD

## 2023-02-27 NOTE — Patient Instructions (Signed)
Medication Instructions:  Your physician recommends that you continue on your current medications as directed. Please refer to the Current Medication list given to you today.   *If you need a refill on your cardiac medications before your next appointment, please call your pharmacy*   Lab Work: Your provider would like for you to have following labs drawn today (CBC, CMP, TSH, Lipid).     Testing/Procedures: No test ordered today    Follow-Up: At [facility], you and your health needs are our priority.  As part of our continuing mission to provide you with exceptional heart care, we have created designated Provider Care Teams.  These Care Teams include your primary Cardiologist (physician) and Advanced Practice Providers (APPs -  Physician Assistants and Nurse Practitioners) who all work together to provide you with the care you need, when you need it.  We recommend signing up for the patient portal called "MyChart".  Sign up information is provided on this After Visit Summary.  MyChart is used to connect with patients for Virtual Visits (Telemedicine).  Patients are able to view lab/test results, encounter notes, upcoming appointments, etc.  Non-urgent messages can be sent to your provider as well.   To learn more about what you can do with MyChart, go to [email or web address].    Your next appointment:   6 month(s)  Provider:   You may see Yvonne Kendall, MD or one of the following Advanced Practice Providers on your designated Care Team:   Nicolasa Ducking, NP Eula Listen, PA-C Cadence Fransico Michael, PA-C Charlsie Quest, NP

## 2023-02-28 ENCOUNTER — Other Ambulatory Visit: Payer: Self-pay

## 2023-02-28 DIAGNOSIS — I10 Essential (primary) hypertension: Secondary | ICD-10-CM

## 2023-02-28 DIAGNOSIS — I453 Trifascicular block: Secondary | ICD-10-CM | POA: Insufficient documentation

## 2023-02-28 DIAGNOSIS — I480 Paroxysmal atrial fibrillation: Secondary | ICD-10-CM

## 2023-02-28 DIAGNOSIS — I5022 Chronic systolic (congestive) heart failure: Secondary | ICD-10-CM

## 2023-02-28 MED ORDER — CARVEDILOL 6.25 MG PO TABS: ORAL_TABLET | ORAL | 3 refills | Status: DC

## 2023-03-07 ENCOUNTER — Other Ambulatory Visit: Payer: Self-pay | Admitting: Internal Medicine

## 2023-03-07 ENCOUNTER — Other Ambulatory Visit: Payer: Self-pay

## 2023-03-07 DIAGNOSIS — E039 Hypothyroidism, unspecified: Secondary | ICD-10-CM

## 2023-03-07 MED ORDER — LEVOTHYROXINE SODIUM 25 MCG PO TABS
25.0000 ug | ORAL_TABLET | Freq: Every day | ORAL | 1 refills | Status: DC
Start: 2023-03-07 — End: 2023-04-18

## 2023-03-26 DIAGNOSIS — N184 Chronic kidney disease, stage 4 (severe): Secondary | ICD-10-CM | POA: Diagnosis not present

## 2023-03-26 DIAGNOSIS — R600 Localized edema: Secondary | ICD-10-CM | POA: Diagnosis not present

## 2023-03-26 DIAGNOSIS — I10 Essential (primary) hypertension: Secondary | ICD-10-CM | POA: Diagnosis not present

## 2023-04-08 ENCOUNTER — Other Ambulatory Visit: Payer: Self-pay | Admitting: Internal Medicine

## 2023-04-09 NOTE — Telephone Encounter (Signed)
Requested Prescriptions  Pending Prescriptions Disp Refills   potassium chloride (KLOR-CON) 10 MEQ tablet [Pharmacy Med Name: potassium chloride ER 10 mEq tablet,extended release] 540 tablet 2    Sig: TAKE SIX TABLETS BY MOUTH THREE TIMES DAILY     Endocrinology:  Minerals - Potassium Supplementation Failed - 04/08/2023  8:03 AM      Failed - Cr in normal range and within 360 days    Creatinine, Ser  Date Value Ref Range Status  02/27/2023 2.36 (H) 0.61 - 1.24 mg/dL Final         Passed - K in normal range and within 360 days    Potassium  Date Value Ref Range Status  02/27/2023 3.9 3.5 - 5.1 mmol/L Final         Passed - Valid encounter within last 12 months    Recent Outpatient Visits           1 month ago Hypertension, unspecified type   [facility] Margarita Mail, DO   7 months ago Other insomnia   [facility] Margarita Mail, DO   8 months ago Hypertension, unspecified type   [facility] Margarita Mail, DO       Future Appointments             In 4 months Margarita Mail, DO [facility], [facility]

## 2023-04-16 ENCOUNTER — Other Ambulatory Visit: Payer: Self-pay | Admitting: Internal Medicine

## 2023-04-16 DIAGNOSIS — E039 Hypothyroidism, unspecified: Secondary | ICD-10-CM

## 2023-04-17 ENCOUNTER — Other Ambulatory Visit: Payer: Self-pay

## 2023-04-17 DIAGNOSIS — E039 Hypothyroidism, unspecified: Secondary | ICD-10-CM

## 2023-04-17 NOTE — Telephone Encounter (Signed)
Requested medication (s) are due for refill today:no  Requested medication (s) are on the active medication list:yes  Last refill:  03/07/23 #30 1 RF  Future visit scheduled: yes  Notes to clinic:  abnormal result   Requested Prescriptions  Pending Prescriptions Disp Refills   levothyroxine (SYNTHROID) 25 MCG tablet [Pharmacy Med Name: levothyroxine 25 mcg tablet] 30 tablet 11    Sig: TAKE ONE TABLET BY MOUTH ONCE DAILY     Endocrinology:  Hypothyroid Agents Failed - 04/16/2023  4:09 PM      Failed - TSH in normal range and within 360 days    TSH  Date Value Ref Range Status  02/27/2023 0.224 (L) 0.350 - 4.500 uIU/mL Final    Comment:    Performed by a 3rd Generation assay with a functional sensitivity of <=0.01 uIU/mL. Performed at [facility], [address] [place], [place] [address]   08/26/2020 14.400 (H) 0.450 - 4.500 uIU/mL Final         Passed - Valid encounter within last 12 months    Recent Outpatient Visits           2 months ago Hypertension, unspecified type   [facility] Margarita Mail, DO   8 months ago Other insomnia   [facility] Margarita Mail, DO   9 months ago Hypertension, unspecified type   [facility] Margarita Mail, DO       Future Appointments             In 4 months Margarita Mail, DO [facility], [facility]

## 2023-04-24 ENCOUNTER — Ambulatory Visit (INDEPENDENT_AMBULATORY_CARE_PROVIDER_SITE_OTHER): Payer: 59 | Admitting: Podiatry

## 2023-04-24 ENCOUNTER — Encounter: Payer: Self-pay | Admitting: Podiatry

## 2023-04-24 DIAGNOSIS — E118 Type 2 diabetes mellitus with unspecified complications: Secondary | ICD-10-CM | POA: Diagnosis not present

## 2023-04-24 DIAGNOSIS — M79675 Pain in left toe(s): Secondary | ICD-10-CM

## 2023-04-24 DIAGNOSIS — L84 Corns and callosities: Secondary | ICD-10-CM | POA: Diagnosis not present

## 2023-04-24 DIAGNOSIS — B351 Tinea unguium: Secondary | ICD-10-CM | POA: Diagnosis not present

## 2023-04-24 DIAGNOSIS — M79674 Pain in right toe(s): Secondary | ICD-10-CM | POA: Diagnosis not present

## 2023-04-28 ENCOUNTER — Encounter: Payer: Self-pay | Admitting: Podiatry

## 2023-04-28 NOTE — Progress Notes (Signed)
  Subjective:  Patient ID: [name], male    DOB: [birth year],  MRN: [id number]    72 y.o. male returns for at risk diabetic foot care overall doing well. Blood sugar has been elevated some  Review of Systems: Negative except as noted in the HPI. Denies N/V/F/Ch.   Objective:  There were no vitals filed for this visit. There is no height or weight on file to calculate BMI. Constitutional Well developed. Well nourished.  Vascular Foot warm and well perfused. Capillary refill normal to all digits.   Neurologic Normal speech. Oriented to person, place, and time. Epicritic sensation to light touch grossly reduced bilaterally.  Dermatologic No recurrence of wound or ulceration.  He has thickened elongated toenails x10 bilaterally with subungual brown and yellow discoloration. Submetatarsal 5 callus  bilateral  Orthopedic: Good strength and ROM    Assessment:   1. Pain due to onychomycosis of toenails of both feet   2. Callus of foot   3. DM (diabetes mellitus), type 2 with complications [facility])      Plan:  Patient was evaluated and treated and all questions answered.   Discussed the etiology and treatment options for the condition in detail with the patient.  Recommended debridement of the nails today. Sharp and mechanical debridement performed of all painful and mycotic nails today. Nails debrided in length and thickness using a nail nipper to level of comfort. Discussed treatment options including appropriate shoe gear. Follow up as needed for painful nails.  All symptomatic hyperkeratoses were safely debrided with a sterile #15 blade to patient's level of comfort without incident. We discussed preventative and palliative care of these lesions including supportive and accommodative shoegear, padding, prefabricated and custom molded accommodative orthoses, use of a pumice stone and lotions/creams daily.       No follow-ups on file.

## 2023-05-20 NOTE — Telephone Encounter (Signed)
Abstracted results and Care Team updated

## 2023-05-31 DIAGNOSIS — I152 Hypertension secondary to endocrine disorders: Secondary | ICD-10-CM | POA: Diagnosis not present

## 2023-05-31 DIAGNOSIS — E039 Hypothyroidism, unspecified: Secondary | ICD-10-CM | POA: Diagnosis not present

## 2023-05-31 DIAGNOSIS — E1159 Type 2 diabetes mellitus with other circulatory complications: Secondary | ICD-10-CM | POA: Diagnosis not present

## 2023-05-31 DIAGNOSIS — E1142 Type 2 diabetes mellitus with diabetic polyneuropathy: Secondary | ICD-10-CM | POA: Diagnosis not present

## 2023-05-31 DIAGNOSIS — E1169 Type 2 diabetes mellitus with other specified complication: Secondary | ICD-10-CM | POA: Diagnosis not present

## 2023-05-31 DIAGNOSIS — E785 Hyperlipidemia, unspecified: Secondary | ICD-10-CM | POA: Diagnosis not present

## 2023-05-31 LAB — HEMOGLOBIN A1C: Hemoglobin A1C: 5.4

## 2023-06-02 ENCOUNTER — Other Ambulatory Visit: Payer: Self-pay | Admitting: Internal Medicine

## 2023-06-06 NOTE — Telephone Encounter (Signed)
 Requested medication (s) are due for refill today:   Yes   Requested medication (s) are on the active medication list:   Yes  Future visit scheduled:   Yes.   08/15/2023 with Dr. Bernardo   Last ordered: 02/14/2023 #180, 1 refill  Unable to refill due to a mag. Being due per protocol.     Requested Prescriptions  Pending Prescriptions Disp Refills   torsemide  (DEMADEX ) 20 MG tablet [Pharmacy Med Name: torsemide  20 mg tablet] 180 tablet 1    Sig: TAKE TWO TABLETS BY MOUTH TWICE DAILY     Cardiovascular:  Diuretics - Loop Failed - 06/06/2023  1:43 PM      Failed - Cr in normal range and within 180 days    Creatinine, Ser  Date Value Ref Range Status  02/27/2023 2.36 (H) 0.61 - 1.24 mg/dL Final         Failed - Mg Level in normal range and within 180 days    Magnesium   Date Value Ref Range Status  10/01/2020 2.3 1.7 - 2.4 mg/dL Final    Comment:    Performed at [facility], [address] [place], [place] [address]         Passed - K in normal range and within 180 days    Potassium  Date Value Ref Range Status  02/27/2023 3.9 3.5 - 5.1 mmol/L Final         Passed - Ca in normal range and within 180 days    Calcium   Date Value Ref Range Status  02/27/2023 9.6 8.9 - 10.3 mg/dL Final         Passed - Na in normal range and within 180 days    Sodium  Date Value Ref Range Status  02/27/2023 140 135 - 145 mmol/L Final  01/11/2021 137 134 - 144 mmol/L Final         Passed - Cl in normal range and within 180 days    Chloride  Date Value Ref Range Status  02/27/2023 98 98 - 111 mmol/L Final         Passed - Last BP in normal range    BP Readings from Last 1 Encounters:  02/27/23 100/60         Passed - Valid encounter within last 6 months    Recent Outpatient Visits           3 months ago Hypertension, unspecified type   [facility] Bernardo Fend, DO   9 months ago Other insomnia   [facility]  Bernardo Fend, DO   10 months ago Hypertension, unspecified type   [facility] Bernardo Fend, DO       Future Appointments             In 2 months Bernardo Fend, DO [facility], [facility]

## 2023-06-13 ENCOUNTER — Other Ambulatory Visit: Payer: Self-pay | Admitting: Internal Medicine

## 2023-06-13 DIAGNOSIS — I5022 Chronic systolic (congestive) heart failure: Secondary | ICD-10-CM

## 2023-06-13 DIAGNOSIS — I480 Paroxysmal atrial fibrillation: Secondary | ICD-10-CM

## 2023-06-17 NOTE — Telephone Encounter (Signed)
 Requested medication (s) are due for refill today: no  Requested medication (s) are on the active medication list: yes  Last refill:  02/14/23 #90 1 RF  Future visit scheduled: yes  Notes to clinic:  med not delegated to NT to RF//abnormal lab work//overdue lab work    Requested Prescriptions  Pending Prescriptions Disp Refills   amiodarone  (PACERONE ) 200 MG tablet [Pharmacy Med Name: amiodarone  200 mg tablet] 90 tablet 1    Sig: TAKE ONE TABLET BY MOUTH ONCE DAILY     Not Delegated - Cardiovascular: Antiarrhythmic Agents - amiodarone  Failed - 06/17/2023 10:55 AM      Failed - This refill cannot be delegated      Failed - Manual Review: Eye exam recommended every 12 months      Failed - TSH in normal range and within 360 days    TSH  Date Value Ref Range Status  02/27/2023 0.224 (L) 0.350 - 4.500 uIU/mL Final    Comment:    Performed by a 3rd Generation assay with a functional sensitivity of <=0.01 uIU/mL. Performed at [facility], [address] [place], [place] [address]   08/26/2020 14.400 (H) 0.450 - 4.500 uIU/mL Final         Failed - Mg Level in normal range and within 360 days    Magnesium   Date Value Ref Range Status  10/01/2020 2.3 1.7 - 2.4 mg/dL Final    Comment:    Performed at [facility], [address] [place], [place] [address]         Failed - Patient had chest x-ray within the last 6 months      Passed - K in normal range and within 180 days    Potassium  Date Value Ref Range Status  02/27/2023 3.9 3.5 - 5.1 mmol/L Final         Passed - AST in normal range and within 180 days    AST  Date Value Ref Range Status  02/27/2023 22 15 - 41 U/L Final         Passed - ALT in normal range and within 180 days    ALT  Date Value Ref Range Status  02/27/2023 21 0 - 44 U/L Final         Passed - Patient had ECG in the last 180 days      Passed - Patient is not pregnant      Passed - Last BP in normal range    BP Readings  from Last 1 Encounters:  02/27/23 100/60         Passed - Last Heart Rate in normal range    Pulse Readings from Last 1 Encounters:  02/27/23 74         Passed - Valid encounter within last 6 months    Recent Outpatient Visits           4 months ago Hypertension, unspecified type   [facility] Bernardo Fend, DO   10 months ago Other insomnia   [facility] Bernardo Fend, DO   11 months ago Hypertension, unspecified type   [facility] Bernardo Fend, DO       Future Appointments             In 1 month Bernardo Fend, DO [facility] Health [facility], [facility]

## 2023-07-01 ENCOUNTER — Other Ambulatory Visit: Payer: Self-pay | Admitting: Internal Medicine

## 2023-07-03 NOTE — Telephone Encounter (Signed)
Refused potassium chloride 10 meq because it's being requested too soon.

## 2023-07-04 ENCOUNTER — Other Ambulatory Visit: Payer: Self-pay | Admitting: Internal Medicine

## 2023-07-05 NOTE — Telephone Encounter (Signed)
Requested Prescriptions  Pending Prescriptions Disp Refills   potassium chloride (KLOR-CON) 10 MEQ tablet [Pharmacy Med Name: potassium chloride ER 10 mEq tablet,extended release] 540 tablet 2    Sig: TAKE SIX TABLETS BY MOUTH THREE TIMES DAILY     Endocrinology:  Minerals - Potassium Supplementation Failed - 07/05/2023  2:03 PM      Failed - Cr in normal range and within 360 days    Creatinine, Ser  Date Value Ref Range Status  02/27/2023 2.36 (H) 0.61 - 1.24 mg/dL Final         Passed - K in normal range and within 360 days    Potassium  Date Value Ref Range Status  02/27/2023 3.9 3.5 - 5.1 mmol/L Final         Passed - Valid encounter within last 12 months    Recent Outpatient Visits           4 months ago Hypertension, unspecified type   [facility] Margarita Mail, DO   10 months ago Other insomnia   [facility] Margarita Mail, DO   11 months ago Hypertension, unspecified type   [facility] Margarita Mail, DO       Future Appointments             In 1 month Margarita Mail, DO [facility], [facility]

## 2023-07-16 DIAGNOSIS — E119 Type 2 diabetes mellitus without complications: Secondary | ICD-10-CM | POA: Diagnosis not present

## 2023-07-20 ENCOUNTER — Other Ambulatory Visit: Payer: Self-pay | Admitting: Internal Medicine

## 2023-07-20 DIAGNOSIS — E118 Type 2 diabetes mellitus with unspecified complications: Secondary | ICD-10-CM

## 2023-07-20 DIAGNOSIS — I5022 Chronic systolic (congestive) heart failure: Secondary | ICD-10-CM

## 2023-07-20 DIAGNOSIS — I10 Essential (primary) hypertension: Secondary | ICD-10-CM

## 2023-07-20 DIAGNOSIS — I480 Paroxysmal atrial fibrillation: Secondary | ICD-10-CM

## 2023-07-22 NOTE — Telephone Encounter (Signed)
Abnormal outside labs- patient odes have appointment schedule 1 month. Courtesy 1 month refills given Requested Prescriptions  Pending Prescriptions Disp Refills   losartan (COZAAR) 25 MG tablet [Pharmacy Med Name: losartan 25 mg tablet] 30 tablet 2    Sig: TAKE 1/2 TABLET BY MOUTH DAILY     Cardiovascular:  Angiotensin Receptor Blockers Failed - 07/22/2023 12:59 PM      Failed - Cr in normal range and within 180 days    Creatinine, Ser  Date Value Ref Range Status  02/27/2023 2.36 (H) 0.61 - 1.24 mg/dL Final         Passed - K in normal range and within 180 days    Potassium  Date Value Ref Range Status  02/27/2023 3.9 3.5 - 5.1 mmol/L Final         Passed - Patient is not pregnant      Passed - Last BP in normal range    BP Readings from Last 1 Encounters:  02/27/23 100/60         Passed - Valid encounter within last 6 months    Recent Outpatient Visits           5 months ago Hypertension, unspecified type   [facility] Margarita Mail, DO   11 months ago Other insomnia   [facility] Margarita Mail, DO   1 year ago Hypertension, unspecified type   [facility] Margarita Mail, DO       Future Appointments             In 3 weeks Margarita Mail, DO [facility]             ELIQUIS 5 MG TABS tablet [Pharmacy Med Name: Eliquis 5 mg tablet] 60 tablet 5    Sig: TAKE ONE TABLET BY MOUTH TWICE DAILY     Hematology:  Anticoagulants - apixaban Failed - 07/22/2023 12:59 PM      Failed - HGB in normal range and within 360 days    Hemoglobin  Date Value Ref Range Status  02/27/2023 12.5 (L) 13.0 - 17.0 g/dL Final  09/81/1914 78.2 13.0 - 17.7 g/dL Final         Failed - HCT in normal range and within 360 days    HCT  Date Value Ref Range Status  02/27/2023 37.3 (L) 39.0 - 52.0 % Final   Hematocrit  Date Value Ref Range Status  07/08/2020  40.3 37.5 - 51.0 % Final         Failed - Cr in normal range and within 360 days    Creatinine, Ser  Date Value Ref Range Status  02/27/2023 2.36 (H) 0.61 - 1.24 mg/dL Final         Passed - PLT in normal range and within 360 days    Platelets  Date Value Ref Range Status  02/27/2023 168 150 - 400 K/uL Final  07/08/2020 144 (L) 150 - 450 x10E3/uL Final         Passed - AST in normal range and within 360 days    AST  Date Value Ref Range Status  02/27/2023 22 15 - 41 U/L Final         Passed - ALT in normal range and within 360 days    ALT  Date Value Ref Range Status  02/27/2023 21 0 - 44 U/L Final         Passed -  Valid encounter within last 12 months    Recent Outpatient Visits           5 months ago Hypertension, unspecified type   [facility] Margarita Mail, DO   11 months ago Other insomnia   [facility] Margarita Mail, DO   1 year ago Hypertension, unspecified type   [facility] Margarita Mail, DO       Future Appointments             In 3 weeks Margarita Mail, DO [facility], [facility]             glipiZIDE (GLUCOTROL) 10 MG tablet [Pharmacy Med Name: glipizide 10 mg tablet] 60 tablet 5    Sig: TAKE ONE TABLET BY MOUTH TWICE DAILY     Endocrinology:  Diabetes - Sulfonylureas Failed - 07/22/2023 12:59 PM      Failed - HBA1C is between 0 and 7.9 and within 180 days    Hgb A1c MFr Bld  Date Value Ref Range Status  01/11/2021 10.2 (H) 4.8 - 5.6 % Final    Comment:             Prediabetes: 5.7 - 6.4          Diabetes: >6.4          Glycemic control for adults with diabetes: <7.0          Failed - Cr in normal range and within 360 days    Creatinine, Ser  Date Value Ref Range Status  02/27/2023 2.36 (H) 0.61 - 1.24 mg/dL Final         Passed - Valid encounter within last 6 months    Recent Outpatient Visits           5  months ago Hypertension, unspecified type   [facility] Margarita Mail, DO   11 months ago Other insomnia   [facility] Margarita Mail, DO   1 year ago Hypertension, unspecified type   [facility] Margarita Mail, DO       Future Appointments             In 3 weeks Margarita Mail, DO [place], [facility]

## 2023-07-24 ENCOUNTER — Ambulatory Visit: Payer: 59 | Admitting: Podiatry

## 2023-08-01 ENCOUNTER — Other Ambulatory Visit: Payer: Self-pay | Admitting: Internal Medicine

## 2023-08-01 DIAGNOSIS — I2510 Atherosclerotic heart disease of native coronary artery without angina pectoris: Secondary | ICD-10-CM

## 2023-08-02 NOTE — Telephone Encounter (Signed)
 Labs in date.  Requested Prescriptions  Pending Prescriptions Disp Refills   rosuvastatin (CRESTOR) 20 MG tablet [Pharmacy Med Name: rosuvastatin 20 mg tablet] 90 tablet 0    Sig: TAKE ONE TABLET BY MOUTH ONCE DAILY     Cardiovascular:  Antilipid - Statins 2 Failed - 08/02/2023  8:48 AM      Failed - Cr in normal range and within 360 days    Creatinine, Ser  Date Value Ref Range Status  02/27/2023 2.36 (H) 0.61 - 1.24 mg/dL Final         Failed - Lipid Panel in normal range within the last 12 months    Cholesterol  Date Value Ref Range Status  02/27/2023 138 0 - 200 mg/dL Final   LDL Cholesterol  Date Value Ref Range Status  02/27/2023 40 0 - 99 mg/dL Final    Comment:           Total Cholesterol/HDL:CHD Risk Coronary Heart Disease Risk Table                     Men   Women  1/2 Average Risk   3.4   3.3  Average Risk       5.0   4.4  2 X Average Risk   9.6   7.1  3 X Average Risk  23.4   11.0        Use the calculated Patient Ratio above and the CHD Risk Table to determine the patient's CHD Risk.        ATP III CLASSIFICATION (LDL):  <100     mg/dL   Optimal  147-829  mg/dL   Near or Above                    Optimal  130-159  mg/dL   Borderline  562-130  mg/dL   High  >865     mg/dL   Very High Performed at [facility], [address] [place], [place] [address]    HDL  Date Value Ref Range Status  02/27/2023 32 (L) >40 mg/dL Final   Triglycerides  Date Value Ref Range Status  02/27/2023 329 (H) <150 mg/dL Final         Passed - Patient is not pregnant      Passed - Valid encounter within last 12 months    Recent Outpatient Visits           5 months ago Hypertension, unspecified type   [facility] Margarita Mail, DO   11 months ago Other insomnia   [facility] Margarita Mail, DO   1 year ago Hypertension, unspecified type   [facility] Margarita Mail, DO       Future Appointments             In 1 week Margarita Mail, DO [facility], [facility]

## 2023-08-07 ENCOUNTER — Ambulatory Visit (INDEPENDENT_AMBULATORY_CARE_PROVIDER_SITE_OTHER): Payer: 59 | Admitting: Podiatry

## 2023-08-07 ENCOUNTER — Encounter: Payer: Self-pay | Admitting: Podiatry

## 2023-08-07 DIAGNOSIS — M79674 Pain in right toe(s): Secondary | ICD-10-CM | POA: Diagnosis not present

## 2023-08-07 DIAGNOSIS — L84 Corns and callosities: Secondary | ICD-10-CM

## 2023-08-07 DIAGNOSIS — B351 Tinea unguium: Secondary | ICD-10-CM

## 2023-08-07 DIAGNOSIS — E118 Type 2 diabetes mellitus with unspecified complications: Secondary | ICD-10-CM | POA: Diagnosis not present

## 2023-08-07 DIAGNOSIS — M79675 Pain in left toe(s): Secondary | ICD-10-CM

## 2023-08-07 NOTE — Progress Notes (Signed)
  Subjective:  Patient ID: [name], male    DOB: 07/02/1950,  MRN: [id number]    73 y.o. male returns for at risk diabetic foot care overall doing well. Blood sugar has been up and down due to dietary changes and recent grieving  Review of Systems: Negative except as noted in the HPI. Denies N/V/F/Ch.   Objective:  There were no vitals filed for this visit. There is no height or weight on file to calculate BMI. Constitutional Well developed. Well nourished.  Vascular Foot warm and well perfused. Capillary refill normal to all digits.   Neurologic Normal speech. Oriented to person, place, and time. Epicritic sensation to light touch grossly reduced bilaterally.  Dermatologic No recurrence of wound or ulceration.  He has thickened elongated toenails x10 bilaterally with subungual brown and yellow discoloration. Submetatarsal 5 callus  bilateral  Orthopedic: Good strength and ROM    Assessment:   1. Pain due to onychomycosis of toenails of both feet   2. Callus of foot   3. DM (diabetes mellitus), type 2 with complications [name])      Plan:  Patient was evaluated and treated and all questions answered.   Discussed the etiology and treatment options for the condition in detail with the patient.  Recommended debridement of the nails today. Sharp and mechanical debridement performed of all painful and mycotic nails today. Nails debrided in length and thickness using a nail nipper to level of comfort. Discussed treatment options including appropriate shoe gear. Follow up as needed for painful nails.  All symptomatic hyperkeratoses were safely debrided with a sterile #15 blade to patient's level of comfort without incident. We discussed preventative and palliative care of these lesions including supportive and accommodative shoegear, padding, prefabricated and custom molded accommodative orthoses, use of a pumice stone and lotions/creams daily.  Left callus had some iatrogenic  bleeding dressed with a bandage today.       Return in about 12 weeks (around 10/30/2023) for at risk diabetic foot care.

## 2023-08-15 ENCOUNTER — Other Ambulatory Visit: Payer: Self-pay

## 2023-08-15 ENCOUNTER — Other Ambulatory Visit: Payer: Self-pay | Admitting: Internal Medicine

## 2023-08-15 ENCOUNTER — Ambulatory Visit: Payer: 59 | Admitting: Internal Medicine

## 2023-08-15 VITALS — BP 126/72 | HR 73 | Temp 97.9°F | Resp 16 | Ht 72.0 in | Wt 245.3 lb

## 2023-08-15 DIAGNOSIS — N184 Chronic kidney disease, stage 4 (severe): Secondary | ICD-10-CM | POA: Diagnosis not present

## 2023-08-15 DIAGNOSIS — I10 Essential (primary) hypertension: Secondary | ICD-10-CM | POA: Diagnosis not present

## 2023-08-15 DIAGNOSIS — I2510 Atherosclerotic heart disease of native coronary artery without angina pectoris: Secondary | ICD-10-CM | POA: Diagnosis not present

## 2023-08-15 DIAGNOSIS — I480 Paroxysmal atrial fibrillation: Secondary | ICD-10-CM | POA: Diagnosis not present

## 2023-08-15 DIAGNOSIS — E118 Type 2 diabetes mellitus with unspecified complications: Secondary | ICD-10-CM

## 2023-08-15 DIAGNOSIS — G4709 Other insomnia: Secondary | ICD-10-CM

## 2023-08-15 DIAGNOSIS — E1169 Type 2 diabetes mellitus with other specified complication: Secondary | ICD-10-CM

## 2023-08-15 DIAGNOSIS — E785 Hyperlipidemia, unspecified: Secondary | ICD-10-CM

## 2023-08-15 DIAGNOSIS — I5022 Chronic systolic (congestive) heart failure: Secondary | ICD-10-CM

## 2023-08-15 DIAGNOSIS — F419 Anxiety disorder, unspecified: Secondary | ICD-10-CM | POA: Insufficient documentation

## 2023-08-15 DIAGNOSIS — E039 Hypothyroidism, unspecified: Secondary | ICD-10-CM | POA: Diagnosis not present

## 2023-08-15 HISTORY — DX: Hypothyroidism, unspecified: E03.9

## 2023-08-15 MED ORDER — LOSARTAN POTASSIUM 25 MG PO TABS
12.5000 mg | ORAL_TABLET | Freq: Every day | ORAL | 1 refills | Status: AC
Start: 2023-08-15 — End: ?

## 2023-08-15 MED ORDER — APIXABAN 5 MG PO TABS
5.0000 mg | ORAL_TABLET | Freq: Two times a day (BID) | ORAL | 5 refills | Status: DC
Start: 2023-08-15 — End: 2024-02-21

## 2023-08-15 MED ORDER — ROSUVASTATIN CALCIUM 20 MG PO TABS
20.0000 mg | ORAL_TABLET | Freq: Every day | ORAL | 0 refills | Status: DC
Start: 2023-08-15 — End: 2024-01-21

## 2023-08-15 MED ORDER — TRAZODONE HCL 50 MG PO TABS
50.0000 mg | ORAL_TABLET | Freq: Every day | ORAL | 1 refills | Status: DC
Start: 2023-08-15 — End: 2024-02-14

## 2023-08-15 MED ORDER — METOLAZONE 2.5 MG PO TABS
2.5000 mg | ORAL_TABLET | Freq: Every day | ORAL | 1 refills | Status: DC
Start: 2023-08-15 — End: 2024-02-12

## 2023-08-15 NOTE — Assessment & Plan Note (Addendum)
 Last GFR 29, following with nephrology.  On Jardiance.

## 2023-08-15 NOTE — Assessment & Plan Note (Signed)
 Blood pressure stable here today, no changes made to medications and appropriate refills sent to pharmacy.

## 2023-08-15 NOTE — Assessment & Plan Note (Signed)
 Thyroid well-controlled, continue current dose of levothyroxine.

## 2023-08-15 NOTE — Progress Notes (Signed)
 Established Patient Office Visit  Subjective    Patient ID: [name], male    DOB: 10/26/1950  Age: 73 y.o. MRN: [id number]  CC:  Chief Complaint  Patient presents with   Medical Management of Chronic Issues    6 month follow up    HPI [name] presents to follow up on chronic medical conditions. Overall he has been doing well since our last visit.   Hypertension/CHF/A.Fib/NICM/OSA: -Medications: Amiodarone 200 mg, Eliquis 5 mg BID, Coreg 6.25 mg BID, Losartan 12.5 mg, Jardiance 10 mg, KCl 40 meQ TID, Torsemide 40 mg daily (extra 20 mg as needed), Metolazone 2.5 mg -Patient is compliant with above medications and reports no side effects. -Checking BP at home (average): not checking -Denies any SOB, CP, vision changes, LE edema or symptoms of hypotension -Following with Cardiology -Last echo 9/22 with improved EF to 40-45% -Compliant with CPAP  HLD/Hx of MI/CAD: -Medications: Crestor 20 mg -Patient is compliant with above medications and reports no side effects.  -Had one bare-metal cardiac stent placed in 1998 -Last cardiac catheterization in 2022 with moderate and nonobstructive CAD -Last lipid panel: Lipid Panel     Component Value Date/Time   CHOL 138 02/27/2023 1746   TRIG 329 (H) 02/27/2023 1746   HDL 32 (L) 02/27/2023 1746   CHOLHDL 4.3 02/27/2023 1746   VLDL 66 (H) 02/27/2023 1746   LDLCALC 40 02/27/2023 1746    Diabetes, Type 2: -Follows with Dr. Gershon Crane -Last A1c 5.4% 12/24 -Medications: Glipizide 20 mg once daily, Jardiance 10 mg, Mounjaro 15 mg weekly, Toujeo 70 units daily  -Patient is compliant with the above medications and reports no side effects.  -Checking BG at home: Dexcom 7  -Eye exam: UTD - follows with Ophthalmology at [facility], usually follows with her twice a year. Also has a history of glaucoma but was fixed surgically with lasers.   -Foot exam: UTD 3/25, follows with podiatry Dr. Lilian Kapur -Microalbumin: Due -Statin:  yes -PNA vaccine: UTD, Prevnar 20 at pharmacy  -Denies symptoms of hypoglycemia, polyuria, polydipsia, numbness extremities, foot ulcers/trauma.   CKD4/History of Nephrolithiasis: -Last labs from 9/24 creatinine 2.36, GFR 29 -Following with Nephrology -History of left nephrectomy in 2019 - due to hydronephrosis from obstruction of stones    Hypothyroidism: -Currently on Levothyroxine 50 mcg  -TSH 12/24 4.7525  Recurrent Fever Blisters: -Currently on Valtrex PRN  Anxiety: -Had been on Klonopin 1 mg at night - has been on for 5 years weaned off successfully -Now on Trazodone 50 mg for sleep  Health Maintenance: -Blood work UTD   Outpatient Encounter Medications as of 08/15/2023  Medication Sig   amiodarone (PACERONE) 200 MG tablet TAKE ONE TABLET BY MOUTH ONCE DAILY   Bromelains (BROMELAIN PO) Take 2 capsules by mouth daily.   carvedilol (COREG) 6.25 MG tablet TAKE ONE TABLET BY MOUTH TWICE DAILY WITH FOOD   CINNAMON PO Take 1,200 mg by mouth in the morning and at bedtime. [place] Cinnamon 1200mg    Coenzyme Q10 (COQ10) 400 MG CAPS Take 400 mg by mouth 2 (two) times daily.   docusate sodium (COLACE) 100 MG capsule 2 (two) times daily. Take 300 mg in the pm & 200 mg in the am.   glipiZIDE (GLUCOTROL) 10 MG tablet TAKE ONE TABLET BY MOUTH TWICE DAILY   HAWTHORNE BERRY PO Take 565 mg by mouth at bedtime.   Insulin Glargine (TOUJEO SOLOSTAR [place]) Inject 100 Units into the skin as needed.   JARDIANCE 10 MG  TABS tablet Take 1 tablet (10 mg total) by mouth every evening.   ketoconazole (NIZORAL) 2 % shampoo Apply 1 Application topically 2 (two) times a week.   levothyroxine (SYNTHROID) 25 MCG tablet TAKE ONE TABLET BY MOUTH ONCE DAILY   magnesium oxide (MAG-OX) 400 (240 Mg) MG tablet TAKE ONE TABLET BY MOUTH ONCE DAILY AS NEEDED   MELATONIN-THEANINE PO Take 2 capsules by mouth at bedtime. Olly Sleep Gummies   mometasone (ELOCON) 0.1 % cream Apply 1 Application topically daily. To areas of  psoriasis   Multiple Vitamins-Minerals (PRESERVISION AREDS 2 PO) Take 1 tablet by mouth in the morning and at bedtime.   potassium chloride (KLOR-CON) 10 MEQ tablet TAKE SIX TABLETS BY MOUTH THREE TIMES DAILY   tirzepatide (MOUNJARO) 12.5 MG/0.5ML Pen Inject 12.5 mg into the skin once a week.   torsemide (DEMADEX) 20 MG tablet TAKE TWO TABLETS BY MOUTH TWICE DAILY   Torsemide 40 MG TABS Take 40 mg by mouth as directed. Take 40 mg once daily with extra 20 mg as needed for weight greater than 275   valACYclovir (VALTREX) 1000 MG tablet Take 1,000 mg by mouth daily as needed.   vitamin C (ASCORBIC ACID) 500 MG tablet Take 500 mg by mouth at bedtime.   zinc gluconate 50 MG tablet Take 50 mg by mouth daily.   [DISCONTINUED] apixaban (ELIQUIS) 5 MG TABS tablet TAKE ONE TABLET BY MOUTH TWICE DAILY   [DISCONTINUED] losartan (COZAAR) 25 MG tablet TAKE 1/2 TABLET BY MOUTH DAILY   [DISCONTINUED] metolazone (ZAROXOLYN) 2.5 MG tablet TAKE ONE TABLET BY MOUTH IN THE MORNING, TAKE WITH TORSEMIDE. (Patient taking differently: Take 2.5 mg by mouth daily.)   [DISCONTINUED] rosuvastatin (CRESTOR) 20 MG tablet TAKE ONE TABLET BY MOUTH ONCE DAILY   [DISCONTINUED] traZODone (DESYREL) 50 MG tablet Take 1 tablet (50 mg total) by mouth at bedtime.   apixaban (ELIQUIS) 5 MG TABS tablet Take 1 tablet (5 mg total) by mouth 2 (two) times daily.   B-D ULTRAFINE III SHORT PEN 31G X 8 MM MISC 2 (two) times daily as needed. (Patient not taking: Reported on 08/15/2023)   losartan (COZAAR) 25 MG tablet Take 0.5 tablets (12.5 mg total) by mouth daily.   metolazone (ZAROXOLYN) 2.5 MG tablet Take 1 tablet (2.5 mg total) by mouth daily.   rosuvastatin (CRESTOR) 20 MG tablet Take 1 tablet (20 mg total) by mouth daily.   traZODone (DESYREL) 50 MG tablet Take 1 tablet (50 mg total) by mouth at bedtime.   No facility-administered encounter medications on file as of 08/15/2023.    Past Medical History:  Diagnosis Date   Allergy [phone]    Anxiety    Mild - Intermiteant   Cataract    early stage   CHF (congestive heart failure) (HCC)    CKD (chronic kidney disease) stage 3, GFR 30-59 ml/min (HCC)    Coronary artery disease    a. 1998 s/p ACS Multi-link BMS to the prox LAD ([place], [place]); b. 07/2015 Cath: LM nl, LAD patent stent, LCX min irregs, OM1/2 min irregs, OM3 nl, RCA min irregs; c. 07/2020 Cath: LM nl, LAD 50p/m, 70d, D2 70, LCX 40p/87m, OM3 40, RCA 60d->Med Rx.   Depression    Intermiteant   Diabetes mellitus type 2, uncontrolled    a. A1c 11.0 in 07/2015.   Glaucoma    Resolved with laser   HFrEF    a. EF 25% by cath in 2013; b. Echo in 07/2015 showing EF  of 15-20%, moderate MR, moderate Pulm HTN, severely dilated IVC; c. 11/2016 Echo: EF 55-60%, no rwma, Gr1 DD, mildly dil LA, nl RV fxn. d. 12/2019 Echo: EF 40-45%, e. 07/2020 Echo: EF 20-25%, sev red RV fxx; f. 02/2021 Echo: EF 40-45%, glob HK/signif sept HK. Nl RV fxn.   History of kidney stones    Hypertension    Hypothyroidism 08/15/2023   Ischemic cardiomyopathy    Kidney stones    Left   Myocardial infarction [facility])    Paroxysmal atrial fibrillation (HCC)    a. Dx 07/2015; b. CHA2DS2VASc = 5-->eliquis. Rhythm control w/ amiodarone.   Pollen allergies 09/21/2015   pt called and stated that he woke up and had some drainage-pt states he did not see the color of the drainage-pt denies running a fever and this only happened once-pt instructed to call Dr Assunta Gambles office if he starts running a fever or if the color of drainage becomes yellow/green   Psoriasis    Pulmonary hypertension (HCC)    Sleep apnea     Past Surgical History:  Procedure Laterality Date   CARDIAC CATHETERIZATION N/A 07/25/2015   Procedure: Right/Left Heart Cath and Coronary Angiography;  Surgeon: Iran Ouch, MD;  Location: [facility] INVASIVE CV LAB;  Service: Cardiovascular;  Laterality: N/A;   CARDIAC CATHE[place]IZATION     [place]   CARDIAC CATHETERIZATION     [place],  [place]   CORONARY ANGIOPLASTY WITH STENT PLACEMENT     CYSTOSCOPY WITH STENT PLACEMENT Left 09/07/2015   Procedure: CYSTOSCOPY WITH STENT PLACEMENT;  Surgeon: Vanna Scotland, MD;  Location: [facility] ORS;  Service: Urology;  Laterality: Left;   CYSTOSCOPY/URETEROSCOPY/HOLMIUM LASER/STENT PLACEMENT Left 10/25/2015   Procedure: CYSTOSCOPY/URETEROSCOPY/HOLMIUM LASER/STENT EXCHANGE;  Surgeon: Vanna Scotland, MD;  Location: [facility];  Service: Urology;  Laterality: Left;   CYSTOSCOPY/URETEROSCOPY/HOLMIUM LASER/STENT PLACEMENT Left 05/11/2019   Procedure: CYSTOSCOPY/URETEROSCOPY/HOLMIUM LASER/STENT PLACEMENT;  Surgeon: Vanna Scotland, MD;  Location: [facility];  Service: Urology;  Laterality: Left;   EXTRACORPOREAL SHOCK WAVE LITHOTRIPSY Left 04/02/2019   Procedure: EXTRACORPOREAL SHOCK WAVE LITHOTRIPSY (ESWL);  Surgeon: Vanna Scotland, MD;  Location: [facility] OR[facility];  Service: Urology;  Laterality: Left;   EXTRACORPOREAL SHOCK WAVE LITHOTRIPSY Left 03/05/2019   Procedure: EXTRACORPOREAL SHOCK WAVE LITHOTRIPSY (ESWL);  Surgeon: Riki Altes, MD;  Location: [facility];  Service: Urology;  Laterality: Left;   EYE SURGERY Bilateral    LASER FOR GLAUCOMA   IRRIGATION AND DEBRIDEMENT FOOT Left 09/30/2020   Procedure: IRRIGATION AND DEBRIDEMENT FOOT;  Surgeon: Edwin Cap, DPM;  Location: [facility];  Service: Podiatry;  Laterality: Left;   KIDNEY SURGERY     LOWER EXTREMITY ANGIOGRAPHY Left 10/04/2020   Procedure: Lower Extremity Angiography;  Surgeon: Renford Dills, MD;  Location: [facility];  Service: Cardiovascular;  Laterality: Left;   NEPHROSTOMY TUBE PLACEMENT ([facility] HX) Left 11/2019   RIGHT/LEFT HEART CATH AND CORONARY ANGIOGRAPHY N/A 07/12/2020   Procedure: RIGHT/LEFT HEART CATH AND CORONARY ANGIOGRAPHY;  Surgeon: Yvonne Kendall, MD;  Location: [facility];  Service: Cardiovascular;  Laterality: N/A;   ROBOT ASSISTED LAPAROSCOPIC NEPHRECTOMY Left 12/10/2019   Procedure: XI ROBOTIC  ASSISTED LAPAROSCOPIC NEPHRECTOMY;  Surgeon: Vanna Scotland, MD;  Location: [facility];  Service: Urology;  Laterality: Left;   URETEROSCOPY WITH HOLMIUM LASER LITHOTRIPSY Left 09/07/2015   Procedure: URETEROSCOPY WITH HOLMIUM LASER LITHOTRIPSY;  Surgeon: Vanna Scotland, MD;  Location: [facility];  Service: Urology;  Laterality: Left;    Family History  Problem Relation Age of Onset  Heart failure Father    Arthritis Father    Heart disease Father    Hypertension Father    Heart attack Brother    Diabetes Brother    Heart disease Brother    Hypertension Brother    Birth defects Sister    Early death Sister    Early death Brother    Heart disease Brother    Hypertension Brother    Kidney cancer Neg Hx    Bladder Cancer Neg Hx    Prostate cancer Neg Hx     Social History   Socioeconomic History   Marital status: Single    Spouse name: Not on file   Number of children: Not on file   Years of education: Not on file   Highest education level: Some college, no degree  Occupational History   Not on file  Tobacco Use   Smoking status: Never   Smokeless tobacco: Never  Vaping Use   Vaping status: Never Used  Substance and Sexual Activity   Alcohol use: Not Currently    Comment: less than 1   Drug use: Yes    Types: Cocaine, LSD, Marijuana, Opium    Comment: with the exception of marijuana nothing used in 30 years!   Sexual activity: Not Currently    Birth control/protection: Condom  Other Topics Concern   Not on file  Social History Narrative   Not on file   Social Drivers of Health   Financial Resource Strain: Medium Risk (08/15/2023)   Overall Financial Resource Strain (CARDIA)    Difficulty of Paying Living Expenses: Somewhat hard  Food Insecurity: Food Insecurity Present (08/15/2023)   Hunger Vital Sign    Worried About Running Out of Food in the Last Year: Sometimes true    Ran Out of Food in the Last Year: Never true  Transportation Needs: No Transportation  Needs (08/15/2023)   PRAPARE - Administrator, Civil Service (Medical): No    Lack of Transportation (Non-Medical): No  Physical Activity: Insufficiently Active (08/15/2023)   Exercise Vital Sign    Days of Exercise per Week: 1 day    Minutes of Exercise per Session: 20 min  Stress: No Stress Concern Present (08/15/2023)   [employer] of Occupational Health - Occupational Stress Questionnaire    Feeling of Stress : Only a little  Social Connections: Socially Isolated (08/15/2023)   Social Connection and Isolation Panel [NHANES]    Frequency of Communication with Friends and Family: Twice a week    Frequency of Social Gatherings with Friends and Family: Once a week    Attends Religious Services: Never    Database administrator or Organizations: No    Attends Banker Meetings: Never    Marital Status: Divorced  Catering manager Violence: Not At Risk (09/27/2022)   Humiliation, Afraid, Rape, and Kick questionnaire    Fear of Current or Ex-Partner: No    Emotionally Abused: No    Physically Abused: No    Sexually Abused: No    Review of Systems  All other systems reviewed and are negative.     Objective    BP 126/72   Pulse 73   Temp 97.9 F (36.6 C) (Oral)   Resp 16   Ht 6' (1.829 m)   Wt 245 lb 4.8 oz (111.3 kg)   SpO2 98%   BMI 33.27 kg/m   Physical Exam Constitutional:      Appearance: Normal appearance.  HENT:  Head: Normocephalic and atraumatic.  Eyes:     Conjunctiva/sclera: Conjunctivae normal.  Cardiovascular:     Rate and Rhythm: Normal rate and regular rhythm.  Pulmonary:     Effort: Pulmonary effort is normal.     Breath sounds: Normal breath sounds.  Skin:    General: Skin is warm and dry.  Neurological:     General: No focal deficit present.     Mental Status: [name] alert. Mental status is at baseline.  Psychiatric:        Mood and Affect: Mood normal.        Behavior: Behavior normal.         Assessment &  Plan:   Hypertension, unspecified type Assessment & Plan: Blood pressure stable here today, no changes made to medications and appropriate refills sent to pharmacy.    Orders: -     Losartan Potassium; Take 0.5 tablets (12.5 mg total) by mouth daily.  Dispense: 90 tablet; Refill: 1  Chronic HFrEF (heart failure with reduced ejection fraction) (HCC) Assessment & Plan: Following with cardiology, currently on diuretic and beta-blocker.  Orders: -     Apixaban; Take 1 tablet (5 mg total) by mouth 2 (two) times daily.  Dispense: 60 tablet; Refill: 5 -     Losartan Potassium; Take 0.5 tablets (12.5 mg total) by mouth daily.  Dispense: 90 tablet; Refill: 1 -     metOLazone; Take 1 tablet (2.5 mg total) by mouth daily.  Dispense: 90 tablet; Refill: 1  Paroxysmal atrial fibrillation (HCC) Assessment & Plan: Continue Eliquis, beta-blocker and amiodarone, following with cardiology.  Orders: -     Apixaban; Take 1 tablet (5 mg total) by mouth 2 (two) times daily.  Dispense: 60 tablet; Refill: 5  Hyperlipidemia associated with type 2 diabetes mellitus (HCC) Assessment & Plan: Stable, continue statin.   Coronary artery disease involving native coronary artery of native heart without angina pectoris Assessment & Plan: LDL controlled on labs from September continue Crestor 20 mg.  Orders: -     Rosuvastatin Calcium; Take 1 tablet (20 mg total) by mouth daily.  Dispense: 90 tablet; Refill: 0  DM (diabetes mellitus), type 2 with complications [facility]) Assessment & Plan: Following with endocrinology, A1c controlled at 5.4%.  Medications reviewed, no changes at this time.  Microalbumin due.  Orders: -     Microalbumin / creatinine urine ratio  Hypothyroidism, unspecified type Assessment & Plan: Thyroid well-controlled, continue current dose of levothyroxine.   Stage 4 chronic kidney disease (HCC) Assessment & Plan: Last GFR 29, following with nephrology.  On Jardiance.   Other  insomnia Assessment & Plan: Stable on trazodone, refilled.  Orders: -     traZODone HCl; Take 1 tablet (50 mg total) by mouth at bedtime.  Dispense: 90 tablet; Refill: 1     Return in about 6 months (around 02/15/2024).   Margarita Mail, DO

## 2023-08-15 NOTE — Assessment & Plan Note (Signed)
Stable on trazodone, refilled.

## 2023-08-15 NOTE — Telephone Encounter (Signed)
 Requested Prescriptions  Pending Prescriptions Disp Refills   glipiZIDE (GLUCOTROL) 10 MG tablet [Pharmacy Med Name: glipizide 10 mg tablet] 180 tablet 0    Sig: TAKE ONE TABLET BY MOUTH TWICE DAILY     Endocrinology:  Diabetes - Sulfonylureas Failed - 08/15/2023  4:07 PM      Failed - Cr in normal range and within 360 days    Creatinine, Ser  Date Value Ref Range Status  02/27/2023 2.36 (H) 0.61 - 1.24 mg/dL Final         Failed - Valid encounter within last 6 months    Recent Outpatient Visits           6 months ago Hypertension, unspecified type   [facility] Margarita Mail, DO   1 year ago Other insomnia   [facility] Margarita Mail, DO   1 year ago Hypertension, unspecified type   [facility] Margarita Mail, DO       Future Appointments             In 6 months Margarita Mail, DO [place] [facility]            Passed - HBA1C is between 0 and 7.9 and within 180 days    Hemoglobin A1C  Date Value Ref Range Status  05/31/2023 5.4  Final          ELIQUIS 5 MG TABS tablet [Pharmacy Med Name: Eliquis 5 mg tablet] 60 tablet 0    Sig: TAKE ONE TABLET BY MOUTH TWICE DAILY     Hematology:  Anticoagulants - apixaban Failed - 08/15/2023  4:07 PM      Failed - HGB in normal range and within 360 days    Hemoglobin  Date Value Ref Range Status  02/27/2023 12.5 (L) 13.0 - 17.0 g/dL Final  08/65/7846 96.2 13.0 - 17.7 g/dL Final         Failed - HCT in normal range and within 360 days    HCT  Date Value Ref Range Status  02/27/2023 37.3 (L) 39.0 - 52.0 % Final   Hematocrit  Date Value Ref Range Status  07/08/2020 40.3 37.5 - 51.0 % Final         Failed - Cr in normal range and within 360 days    Creatinine, Ser  Date Value Ref Range Status  02/27/2023 2.36 (H) 0.61 - 1.24 mg/dL Final         Passed - PLT in normal range and within 360 days     Platelets  Date Value Ref Range Status  02/27/2023 168 150 - 400 K/uL Final  07/08/2020 144 (L) 150 - 450 x10E3/uL Final         Passed - AST in normal range and within 360 days    AST  Date Value Ref Range Status  02/27/2023 22 15 - 41 U/L Final         Passed - ALT in normal range and within 360 days    ALT  Date Value Ref Range Status  02/27/2023 21 0 - 44 U/L Final         Passed - Valid encounter within last 12 months    Recent Outpatient Visits           6 months ago Hypertension, unspecified type   [facility] Margarita Mail, DO   1 year ago Other insomnia   [place]  [facility] Margarita Mail, DO   1 year ago Hypertension, unspecified type   [facility] Margarita Mail, DO       Future Appointments             In 6 months Margarita Mail, DO [facility]

## 2023-08-15 NOTE — Assessment & Plan Note (Signed)
 Continue Eliquis, beta-blocker and amiodarone, following with cardiology.

## 2023-08-15 NOTE — Assessment & Plan Note (Signed)
 Following with endocrinology, A1c controlled at 5.4%.  Medications reviewed, no changes at this time.  Microalbumin due.

## 2023-08-15 NOTE — Assessment & Plan Note (Signed)
 LDL controlled on labs from September continue Crestor 20 mg.

## 2023-08-15 NOTE — Assessment & Plan Note (Signed)
 Stable, continue statin

## 2023-08-15 NOTE — Assessment & Plan Note (Signed)
 Following with cardiology, currently on diuretic and beta-blocker.

## 2023-08-16 LAB — MICROALBUMIN / CREATININE URINE RATIO
Creatinine, Urine: 38 mg/dL (ref 20–320)
Microalb Creat Ratio: 11 mg/g{creat} (ref <30)
Microalb, Ur: 0.4 mg/dL

## 2023-08-30 ENCOUNTER — Other Ambulatory Visit: Payer: Self-pay | Admitting: Internal Medicine

## 2023-08-30 DIAGNOSIS — I5022 Chronic systolic (congestive) heart failure: Secondary | ICD-10-CM

## 2023-08-30 DIAGNOSIS — E039 Hypothyroidism, unspecified: Secondary | ICD-10-CM

## 2023-08-30 DIAGNOSIS — I480 Paroxysmal atrial fibrillation: Secondary | ICD-10-CM

## 2023-09-09 DIAGNOSIS — N184 Chronic kidney disease, stage 4 (severe): Secondary | ICD-10-CM | POA: Diagnosis not present

## 2023-09-09 DIAGNOSIS — I10 Essential (primary) hypertension: Secondary | ICD-10-CM | POA: Diagnosis not present

## 2023-09-09 DIAGNOSIS — R600 Localized edema: Secondary | ICD-10-CM | POA: Diagnosis not present

## 2023-09-15 ENCOUNTER — Other Ambulatory Visit: Payer: Self-pay | Admitting: Internal Medicine

## 2023-09-16 NOTE — Telephone Encounter (Signed)
 Requested Prescriptions  Refused Prescriptions Disp Refills   potassium chloride (KLOR-CON) 10 MEQ tablet [Pharmacy Med Name: potassium chloride ER 10 mEq tablet,extended release] 540 tablet 2    Sig: TAKE SIX TABLETS BY MOUTH THREE TIMES DAILY     Endocrinology:  Minerals - Potassium Supplementation Failed - 09/16/2023  5:44 PM      Failed - Cr in normal range and within 360 days    Creatinine, Ser  Date Value Ref Range Status  02/27/2023 2.36 (H) 0.61 - 1.24 mg/dL Final   Creatinine, Urine  Date Value Ref Range Status  08/15/2023 38 20 - 320 mg/dL Final         Passed - K in normal range and within 360 days    Potassium  Date Value Ref Range Status  02/27/2023 3.9 3.5 - 5.1 mmol/L Final         Passed - Valid encounter within last 12 months    Recent Outpatient Visits           1 month ago Hypertension, unspecified type   [facility] Rockney Cid, DO       Future Appointments             In 5 months Rockney Cid, DO [facility]

## 2023-10-07 ENCOUNTER — Other Ambulatory Visit: Payer: Self-pay | Admitting: Internal Medicine

## 2023-10-07 DIAGNOSIS — E039 Hypothyroidism, unspecified: Secondary | ICD-10-CM

## 2023-10-07 DIAGNOSIS — I5022 Chronic systolic (congestive) heart failure: Secondary | ICD-10-CM

## 2023-10-07 DIAGNOSIS — I480 Paroxysmal atrial fibrillation: Secondary | ICD-10-CM

## 2023-10-07 DIAGNOSIS — E118 Type 2 diabetes mellitus with unspecified complications: Secondary | ICD-10-CM

## 2023-10-09 NOTE — Telephone Encounter (Signed)
 Requested medication (s) are due for refill today: yes except glipizide   Requested medication (s) are on the active medication list: yes  Last refill:  torsemide : 06/11/23 #180 1 RF Levothyroxine : 04/18/23 #90 1 RF Glipizide : 08/15/23 #180  Future visit scheduled: yes  Notes to clinic:  overdue labs   Requested Prescriptions  Pending Prescriptions Disp Refills   potassium chloride  (KLOR-CON ) 10 MEQ tablet [Pharmacy Med Name: potassium chloride  ER 10 mEq tablet,extended release] 540 tablet     Sig: TAKE SIX TABLETS BY MOUTH THREE TIMES DAILY     Endocrinology:  Minerals - Potassium Supplementation Failed - 10/09/2023 10:22 AM      Failed - Cr in normal range and within 360 days    Creatinine, Ser  Date Value Ref Range Status  02/27/2023 2.36 (H) 0.61 - 1.24 mg/dL Final   Creatinine, Urine  Date Value Ref Range Status  08/15/2023 38 20 - 320 mg/dL Final         Passed - K in normal range and within 360 days    Potassium  Date Value Ref Range Status  02/27/2023 3.9 3.5 - 5.1 mmol/L Final         Passed - Valid encounter within last 12 months    Recent Outpatient Visits           1 month ago Hypertension, unspecified type   [facility] Rockney Cid, DO       Future Appointments             In 4 months Rockney Cid, DO [facility]             torsemide  (DEMADEX ) 20 MG tablet [Pharmacy Med Name: torsemide  20 mg tablet] 180 tablet     Sig: TAKE TWO TABLETS BY MOUTH TWICE DAILY     Cardiovascular:  Diuretics - Loop Failed - 10/09/2023 10:22 AM      Failed - K in normal range and within 180 days    Potassium  Date Value Ref Range Status  02/27/2023 3.9 3.5 - 5.1 mmol/L Final         Failed - Ca in normal range and within 180 days    Calcium   Date Value Ref Range Status  02/27/2023 9.6 8.9 - 10.3 mg/dL Final         Failed - Na in normal range and within 180 days    Sodium  Date Value Ref  Range Status  02/27/2023 140 135 - 145 mmol/L Final  01/11/2021 137 134 - 144 mmol/L Final         Failed - Cr in normal range and within 180 days    Creatinine, Ser  Date Value Ref Range Status  02/27/2023 2.36 (H) 0.61 - 1.24 mg/dL Final   Creatinine, Urine  Date Value Ref Range Status  08/15/2023 38 20 - 320 mg/dL Final         Failed - Cl in normal range and within 180 days    Chloride  Date Value Ref Range Status  02/27/2023 98 98 - 111 mmol/L Final         Failed - Mg Level in normal range and within 180 days    Magnesium   Date Value Ref Range Status  10/01/2020 2.3 1.7 - 2.4 mg/dL Final    Comment:    Performed at [facility], [address] [place], [place] [address]         Passed -  Last BP in normal range    BP Readings from Last 1 Encounters:  08/15/23 126/72         Passed - Valid encounter within last 6 months    Recent Outpatient Visits           1 month ago Hypertension, unspecified type   [facility] Rockney Cid, DO       Future Appointments             In 4 months Rockney Cid, DO [place] [facility], [facility]             levothyroxine  (SYNTHROID ) 25 MCG tablet [Pharmacy Med Name: levothyroxine  25 mcg tablet] 90 tablet     Sig: TAKE ONE TABLET BY MOUTH ONCE DAILY     Endocrinology:  Hypothyroid Agents Failed - 10/09/2023 10:22 AM      Failed - TSH in normal range and within 360 days    TSH  Date Value Ref Range Status  02/27/2023 0.224 (L) 0.350 - 4.500 uIU/mL Final    Comment:    Performed by a 3rd Generation assay with a functional sensitivity of <=0.01 uIU/mL. Performed at [facility], [address] [place], [place] [address]   08/26/2020 14.400 (H) 0.450 - 4.500 uIU/mL Final         Passed - Valid encounter within last 12 months    Recent Outpatient Visits           1 month ago Hypertension, unspecified type   [facility] Rockney Cid, DO       Future Appointments             In 4 months Rockney Cid, DO [facility], [facility]             glipiZIDE  (GLUCOTROL ) 10 MG tablet [Pharmacy Med Name: glipizide  10 mg tablet] 180 tablet     Sig: TAKE ONE TABLET BY MOUTH TWICE DAILY     Endocrinology:  Diabetes - Sulfonylureas Failed - 10/09/2023 10:22 AM      Failed - Cr in normal range and within 360 days    Creatinine, Ser  Date Value Ref Range Status  02/27/2023 2.36 (H) 0.61 - 1.24 mg/dL Final   Creatinine, Urine  Date Value Ref Range Status  08/15/2023 38 20 - 320 mg/dL Final         Passed - HBA1C is between 0 and 7.9 and within 180 days    Hemoglobin A1C  Date Value Ref Range Status  05/31/2023 5.4  Final         Passed - Valid encounter within last 6 months    Recent Outpatient Visits           1 month ago Hypertension, unspecified type   [facility] Rockney Cid, DO       Future Appointments             In 4 months Rockney Cid, DO [place] [facility], [facility]            Refused Prescriptions Disp Refills   apixaban  (ELIQUIS ) 5 MG TABS tablet [Pharmacy Med Name: Eliquis  5 mg tablet] 60 tablet     Sig: TAKE ONE TABLET BY MOUTH TWICE DAILY     Hematology:  Anticoagulants - apixaban  Failed - 10/09/2023 10:22 AM      Failed - HGB in normal range and within 360 days  Hemoglobin  Date Value Ref Range Status  02/27/2023 12.5 (L) 13.0 - 17.0 g/dL Final  16/03/9603 54.0 13.0 - 17.7 g/dL Final         Failed - HCT in normal range and within 360 days    HCT  Date Value Ref Range Status  02/27/2023 37.3 (L) 39.0 - 52.0 % Final   Hematocrit  Date Value Ref Range Status  07/08/2020 40.3 37.5 - 51.0 % Final         Failed - Cr in normal range and within 360 days    Creatinine, Ser  Date Value Ref Range Status  02/27/2023 2.36 (H) 0.61 - 1.24 mg/dL Final   Creatinine, Urine   Date Value Ref Range Status  08/15/2023 38 20 - 320 mg/dL Final         Passed - PLT in normal range and within 360 days    Platelets  Date Value Ref Range Status  02/27/2023 168 150 - 400 K/uL Final  07/08/2020 144 (L) 150 - 450 x10E3/uL Final         Passed - AST in normal range and within 360 days    AST  Date Value Ref Range Status  02/27/2023 22 15 - 41 U/L Final         Passed - ALT in normal range and within 360 days    ALT  Date Value Ref Range Status  02/27/2023 21 0 - 44 U/L Final         Passed - Valid encounter within last 12 months    Recent Outpatient Visits           1 month ago Hypertension, unspecified type   [facility] Rockney Cid, DO       Future Appointments             In 4 months Rockney Cid, DO [facility], [facility]

## 2023-10-09 NOTE — Telephone Encounter (Signed)
 Requested Prescriptions  Pending Prescriptions Disp Refills   potassium chloride  (KLOR-CON ) 10 MEQ tablet [Pharmacy Med Name: potassium chloride  ER 10 mEq tablet,extended release] 540 tablet     Sig: TAKE SIX TABLETS BY MOUTH THREE TIMES DAILY     Endocrinology:  Minerals - Potassium Supplementation Failed - 10/09/2023 10:20 AM      Failed - Cr in normal range and within 360 days    Creatinine, Ser  Date Value Ref Range Status  02/27/2023 2.36 (H) 0.61 - 1.24 mg/dL Final   Creatinine, Urine  Date Value Ref Range Status  08/15/2023 38 20 - 320 mg/dL Final         Passed - K in normal range and within 360 days    Potassium  Date Value Ref Range Status  02/27/2023 3.9 3.5 - 5.1 mmol/L Final         Passed - Valid encounter within last 12 months    Recent Outpatient Visits           1 month ago Hypertension, unspecified type   [facility] Rockney Cid, DO       Future Appointments             In 4 months Rockney Cid, DO  [facility]             torsemide  (DEMADEX ) 20 MG tablet [Pharmacy Med Name: torsemide  20 mg tablet] 180 tablet     Sig: TAKE TWO TABLETS BY MOUTH TWICE DAILY     Cardiovascular:  Diuretics - Loop Failed - 10/09/2023 10:20 AM      Failed - K in normal range and within 180 days    Potassium  Date Value Ref Range Status  02/27/2023 3.9 3.5 - 5.1 mmol/L Final         Failed - Ca in normal range and within 180 days    Calcium   Date Value Ref Range Status  02/27/2023 9.6 8.9 - 10.3 mg/dL Final         Failed - Na in normal range and within 180 days    Sodium  Date Value Ref Range Status  02/27/2023 140 135 - 145 mmol/L Final  01/11/2021 137 134 - 144 mmol/L Final         Failed - Cr in normal range and within 180 days    Creatinine, Ser  Date Value Ref Range Status  02/27/2023 2.36 (H) 0.61 - 1.24 mg/dL Final   Creatinine, Urine  Date Value Ref Range Status  08/15/2023  38 20 - 320 mg/dL Final         Failed - Cl in normal range and within 180 days    Chloride  Date Value Ref Range Status  02/27/2023 98 98 - 111 mmol/L Final         Failed - Mg Level in normal range and within 180 days    Magnesium   Date Value Ref Range Status  10/01/2020 2.3 1.7 - 2.4 mg/dL Final    Comment:    Performed at [facility], [address] [place], [place] [address]         Passed - Last BP in normal range    BP Readings from Last 1 Encounters:  08/15/23 126/72         Passed - Valid encounter within last 6 months    Recent Outpatient Visits           1 month ago  Hypertension, unspecified type   [facility] Rockney Cid, DO       Future Appointments             In 4 months Rockney Cid, DO [facility], [facility]             levothyroxine  (SYNTHROID ) 25 MCG tablet [Pharmacy Med Name: levothyroxine  25 mcg tablet] 90 tablet     Sig: TAKE ONE TABLET BY MOUTH ONCE DAILY     Endocrinology:  Hypothyroid Agents Failed - 10/09/2023 10:20 AM      Failed - TSH in normal range and within 360 days    TSH  Date Value Ref Range Status  02/27/2023 0.224 (L) 0.350 - 4.500 uIU/mL Final    Comment:    Performed by a 3rd Generation assay with a functional sensitivity of <=0.01 uIU/mL. Performed at [facility], [address] [place], [place] [address]   08/26/2020 14.400 (H) 0.450 - 4.500 uIU/mL Final         Passed - Valid encounter within last 12 months    Recent Outpatient Visits           1 month ago Hypertension, unspecified type   [facility] Rockney Cid, DO       Future Appointments             In 4 months Rockney Cid, DO [place] [facility], [facility]             glipiZIDE  (GLUCOTROL ) 10 MG tablet [Pharmacy Med Name: glipizide  10 mg tablet] 180 tablet     Sig: TAKE ONE TABLET BY MOUTH TWICE DAILY      Endocrinology:  Diabetes - Sulfonylureas Failed - 10/09/2023 10:20 AM      Failed - Cr in normal range and within 360 days    Creatinine, Ser  Date Value Ref Range Status  02/27/2023 2.36 (H) 0.61 - 1.24 mg/dL Final   Creatinine, Urine  Date Value Ref Range Status  08/15/2023 38 20 - 320 mg/dL Final         Passed - HBA1C is between 0 and 7.9 and within 180 days    Hemoglobin A1C  Date Value Ref Range Status  05/31/2023 5.4  Final         Passed - Valid encounter within last 6 months    Recent Outpatient Visits           1 month ago Hypertension, unspecified type   [facility] Rockney Cid, DO       Future Appointments             In 4 months Rockney Cid, DO  [facility], [facility]            Refused Prescriptions Disp Refills   apixaban  (ELIQUIS ) 5 MG TABS tablet [Pharmacy Med Name: Eliquis  5 mg tablet] 60 tablet     Sig: TAKE ONE TABLET BY MOUTH TWICE DAILY     Hematology:  Anticoagulants - apixaban  Failed - 10/09/2023 10:20 AM      Failed - HGB in normal range and within 360 days    Hemoglobin  Date Value Ref Range Status  02/27/2023 12.5 (L) 13.0 - 17.0 g/dL Final  53/66/4403 47.4 13.0 - 17.7 g/dL Final         Failed - HCT in normal range and within 360 days    HCT  Date Value Ref  Range Status  02/27/2023 37.3 (L) 39.0 - 52.0 % Final   Hematocrit  Date Value Ref Range Status  07/08/2020 40.3 37.5 - 51.0 % Final         Failed - Cr in normal range and within 360 days    Creatinine, Ser  Date Value Ref Range Status  02/27/2023 2.36 (H) 0.61 - 1.24 mg/dL Final   Creatinine, Urine  Date Value Ref Range Status  08/15/2023 38 20 - 320 mg/dL Final         Passed - PLT in normal range and within 360 days    Platelets  Date Value Ref Range Status  02/27/2023 168 150 - 400 K/uL Final  07/08/2020 144 (L) 150 - 450 x10E3/uL Final         Passed - AST in normal range and within 360 days     AST  Date Value Ref Range Status  02/27/2023 22 15 - 41 U/L Final         Passed - ALT in normal range and within 360 days    ALT  Date Value Ref Range Status  02/27/2023 21 0 - 44 U/L Final         Passed - Valid encounter within last 12 months    Recent Outpatient Visits           1 month ago Hypertension, unspecified type   [facility] Rockney Cid, DO       Future Appointments             In 4 months Rockney Cid, DO [facility], [facility]

## 2023-10-10 DIAGNOSIS — E119 Type 2 diabetes mellitus without complications: Secondary | ICD-10-CM | POA: Diagnosis not present

## 2023-10-11 ENCOUNTER — Encounter: Payer: Self-pay | Admitting: Medical

## 2023-10-11 ENCOUNTER — Ambulatory Visit: Attending: Medical | Admitting: Medical

## 2023-10-11 VITALS — BP 118/56 | HR 65 | Ht 72.0 in | Wt 242.0 lb

## 2023-10-11 DIAGNOSIS — I453 Trifascicular block: Secondary | ICD-10-CM

## 2023-10-11 DIAGNOSIS — E1122 Type 2 diabetes mellitus with diabetic chronic kidney disease: Secondary | ICD-10-CM | POA: Diagnosis not present

## 2023-10-11 DIAGNOSIS — I2510 Atherosclerotic heart disease of native coronary artery without angina pectoris: Secondary | ICD-10-CM | POA: Diagnosis not present

## 2023-10-11 DIAGNOSIS — I10 Essential (primary) hypertension: Secondary | ICD-10-CM | POA: Diagnosis not present

## 2023-10-11 DIAGNOSIS — I255 Ischemic cardiomyopathy: Secondary | ICD-10-CM | POA: Diagnosis not present

## 2023-10-11 DIAGNOSIS — N183 Chronic kidney disease, stage 3 unspecified: Secondary | ICD-10-CM

## 2023-10-11 DIAGNOSIS — I5022 Chronic systolic (congestive) heart failure: Secondary | ICD-10-CM | POA: Diagnosis not present

## 2023-10-11 DIAGNOSIS — I480 Paroxysmal atrial fibrillation: Secondary | ICD-10-CM | POA: Diagnosis not present

## 2023-10-11 DIAGNOSIS — Z794 Long term (current) use of insulin: Secondary | ICD-10-CM

## 2023-10-11 NOTE — Patient Instructions (Signed)
 Medication Instructions:  Your Physician recommend you continue on your current medication as directed.    *If you need a refill on your cardiac medications before your next appointment, please call your pharmacy*  Lab Work: No labs ordered today  If you have labs (blood work) drawn today and your tests are completely normal, you will receive your results only by: MyChart Message (if you have MyChart) OR A paper copy in the mail If you have any lab test that is abnormal or we need to change your treatment, we will call you to review the results.  Testing/Procedures: No test ordered today   Follow-Up: At [facility], you and your health needs are our priority.  As part of our continuing mission to provide you with exceptional heart care, our providers are all part of one team.  This team includes your primary Cardiologist (physician) and Advanced Practice Providers or APPs (Physician Assistants and Nurse Practitioners) who all work together to provide you with the care you need, when you need it.  Your next appointment:   6 month(s)  Provider:   You may see Sammy Crisp, MD or one of the following Advanced Practice Providers on your designated Care Team:   Laneta Pintos, NP Gildardo Labrador, PA-C Varney Gentleman, PA-C Cadence [place], PA-C Ronald Cockayne, NP Morey Ar, NP

## 2023-10-11 NOTE — Progress Notes (Unsigned)
 Cardiology Office Note:  .   Date:  10/11/2023  ID:  [name], DOB 10-09-1950, MRN [id number] PCP: Rockney Cid, DO  [facility] Providers Cardiologist:  Sammy Crisp, MD {  History of Present Illness: .   [name] is a 73 y.o. male with a history of chronic HFrEF, ischemic cardiomyopathy, CAD status post remote PCI to the proximal LAD, paroxysmal atrial fibrillation, CKD stage III, left nephrectomy, hypertension, diabetes, sleep apnea on CPAP, hyperlipidemia, pulmonary hypertension who presents for follow-up for CHF.  Atrial fibrillation was diagnosed in November 2017, has been managed with amiodarone  and Eliquis .  He has a history of cardiomyopathy that dates back to at least 2013, at which time EF was 25%.  In 2022 he had worsening lower extremity edema despite outpatient titration of torsemide .  Echo showed severe biventricular failure with an EF of 20 to 25% and severely reduced RV function.  Right and left heart cath showed moderate, nonobstructive CAD, similar to study in 2018, as well as significantly elevated right heart pressures.  He was admitted and diuresed.  Repeat echocardiogram in September 2022 showed slight improvement of EF to 40 to 45%, global hypokinesis and significant septal hypokinesis.  Patient has been on torsemide  and metolazone  for volume management.  Patient was last seen 02/27/2023 and was overall feeling well.  He was set due to his long-term roommate passing away.  Carvedilol  was decreased to 2 trifascicular block.  Today, the patient reports he is overall OK. He denies chest pain. Occasional SOB when he walks or does something. He denies heart racing, palpitations. Says fluid levels are good. Has minimal dependent  lower leg edema during the day. He has rare lightheadedness and dizziness. He denies any bleeding issues with Eliquis . He takes 3 metolazone  weekly. May drop it down to 2.   EKG review shows NSR with 1st degree AV block,  RBBB and LAFB. Appears PRI is 274ms-300ms at times.   Studies Reviewed: Aaron Aas   EKG Interpretation Date/Time:  Friday Oct 11 2023 14:56:58 EDT Ventricular Rate:  65 PR Interval:    QRS Duration:  180 QT Interval:  488 QTC Calculation: 507 R Axis:   -85  Text Interpretation: Normal sinus rhythm First degree A-V block Right bundle branch block Left anterior fascicular block Bifascicular block Possible Lateral infarct (cited on or before 10-Apr-2019) When compared with ECG of 27-Feb-2023 16:25, Current undetermined rhythm precludes rhythm comparison, needs review Questionable change in initial forces of Lateral leads Confirmed by Gennaro Khat, Sabriel Borromeo ([id number]) on 10/11/2023 4:21:01 PM    Echo 02/2021  1. Challenging image quality   2. Left ventricular ejection fraction, by estimation, is 40 to 45%. The  left ventricle has moderately decreased function. The left ventricle  demonstrates global hypokinesis, with significant septal hypokinesis  (possibly secondary to bundle branch block)   .   3. Right ventricular systolic function is normal. The right ventricular  size is normal.   4. The mitral valve is normal in structure. No evidence of mitral valve  regurgitation. No evidence of mitral stenosis.   R/L heart cath 07/2020 Conclusions: Moderate noncritical coronary artery disease involving multiple vessels, similar to the prior catheterization from 2017. Severely elevated left heart, right heart, and pulmonary artery pressures. Mildly reduced Fick cardiac output/index.   Recommendations: Admit for diuresis and optimization of goal-directed medical therapy. Continue secondary prevention of coronary artery disease.   Sammy Crisp, MD [facility]   Recommendations  Antiplatelet/Anticoag Recommend to resume Apixaban , at  currently prescribed dose and frequency on 07/12/2020. Concurrent antiplatelet therapy not recommended.  Discharge Date In the absence of any other complications or medical  issues, we expect the patient to be ready for discharge from a cath perspective on 07/14/2020.   Echo 07/2020 1. Left ventricular ejection fraction, by estimation, is 20 to 25%. The  left ventricle has severely decreased function. The left ventricle  demonstrates global hypokinesis. The left ventricular internal cavity size  was moderately dilated. Left  ventricular diastolic parameters are indeterminate.   2. Right ventricular systolic function is severely reduced. The right  ventricular size is severely enlarged. There is normal pulmonary artery  systolic pressure.   3. Left atrial size was moderately dilated.   4. Mild to moderate mitral valve regurgitation.      Physical Exam:   VS:  BP (!) 118/56 (BP Location: Left Arm, Patient Position: Sitting, Cuff Size: Large)   Pulse 65   Ht 6' (1.829 m)   Wt 242 lb (109.8 kg)   SpO2 97%   BMI 32.82 kg/m    Wt Readings from Last 3 Encounters:  10/11/23 242 lb (109.8 kg)  08/15/23 245 lb 4.8 oz (111.3 kg)  02/27/23 239 lb 12.8 oz (108.8 kg)    GEN: Well nourished, well developed in no acute distress NECK: No JVD; No carotid bruits CARDIAC: RRR, no murmurs, rubs, gallops RESPIRATORY:  Clear to auscultation without rales, wheezing or rhonchi  ABDOMEN: Soft, non-tender, non-distended EXTREMITIES:  trace lower leg edema; No deformity   ASSESSMENT AND PLAN: .    Chronic HFrEF Patient denies lower leg edema or shortness of breath, however he is sedentary at baseline.  Patient takes torsemide  daily and metolazone  3 times a week.  He may drop the metolazone  down to twice weekly. On exam, he has trace edema. He eats a low salt diet. Echo from 2022 showed LVEF 40045%, global HK, septal HK, normal RVSF.   Trifascicular block Prior EKG showed normal sinus rhythm with first-degree AV block, right bundle blanch block, and left anterior fascicular block and Coreg  was decreased.  EKG today shows possible worsening PRI, right bundle branch block, and  left anterior fascicular block. PTRI appears to be 243ms-300ms. Recommended decrease Coreg , but patient would like Dr. Colby Daub recommendations.  Coronary artery disease Patient denies chest pain, but has shortness of breath with exertion.  Suspect this is due to deconditioning as patient has sedentary lifestyle.  No further workup at this time.  Continue Crestor .  No aspirin  given Eliquis .  Paroxysmal A-fib EKG shows normal sinus rhythm.  Continue Eliquis  5 mg twice daily and amiodarone  200 mg daily.  Would like to decrease Coreg  as above.  CKD stage IV Serum creatinine baseline around 2.2.  He follows with nephrology as outpatient.  DM2 A1C 5.4       Dispo: Follow-up in 6 months  Signed, Atari Novick Rebekah Canada, PA-C

## 2023-10-14 MED ORDER — CARVEDILOL 3.125 MG PO TABS: ORAL_TABLET | ORAL | 3 refills | Status: DC

## 2023-10-14 NOTE — Addendum Note (Signed)
 Addended by: Malikhi Ogan D on: 10/14/2023 04:13 PM   Modules accepted: Orders

## 2023-10-14 NOTE — Progress Notes (Signed)
 Called patient to advise on recommendation to decrease Coreg  to 3.125 mg BID - patient verbalized understanding and agreement with plan, new RX sent to pharmacy on file    Furth, Cadence H, PA-C  P Cv Div Burl Triage; Chapman Commodore, RN Please call patient and let him know Dr. Nolan Battle recommend decreasing Coreg  to 3.125mg  BID.

## 2023-10-18 DIAGNOSIS — H401131 Primary open-angle glaucoma, bilateral, mild stage: Secondary | ICD-10-CM | POA: Diagnosis not present

## 2023-10-27 ENCOUNTER — Other Ambulatory Visit: Payer: Self-pay | Admitting: Internal Medicine

## 2023-10-30 ENCOUNTER — Ambulatory Visit (INDEPENDENT_AMBULATORY_CARE_PROVIDER_SITE_OTHER): Admitting: Podiatry

## 2023-10-30 ENCOUNTER — Encounter: Payer: Self-pay | Admitting: Podiatry

## 2023-10-30 DIAGNOSIS — M79675 Pain in left toe(s): Secondary | ICD-10-CM | POA: Diagnosis not present

## 2023-10-30 DIAGNOSIS — M79674 Pain in right toe(s): Secondary | ICD-10-CM

## 2023-10-30 DIAGNOSIS — B351 Tinea unguium: Secondary | ICD-10-CM | POA: Diagnosis not present

## 2023-10-30 NOTE — Telephone Encounter (Signed)
 Requested medication (s) are due for refill today: yes  Requested medication (s) are on the active medication list: yes  Last refill:  10/11/23 #180  Future visit scheduled: yes  Notes to clinic:  overdue lab work    Requested Prescriptions  Pending Prescriptions Disp Refills   torsemide  (DEMADEX ) 20 MG tablet [Pharmacy Med Name: torsemide  20 mg tablet] 180 tablet 0    Sig: TAKE TWO TABLETS BY MOUTH TWICE DAILY     Cardiovascular:  Diuretics - Loop Failed - 10/30/2023  1:10 PM      Failed - K in normal range and within 180 days    Potassium  Date Value Ref Range Status  02/27/2023 3.9 3.5 - 5.1 mmol/L Final         Failed - Ca in normal range and within 180 days    Calcium   Date Value Ref Range Status  02/27/2023 9.6 8.9 - 10.3 mg/dL Final         Failed - Na in normal range and within 180 days    Sodium  Date Value Ref Range Status  02/27/2023 140 135 - 145 mmol/L Final  01/11/2021 137 134 - 144 mmol/L Final         Failed - Cr in normal range and within 180 days    Creatinine, Ser  Date Value Ref Range Status  02/27/2023 2.36 (H) 0.61 - 1.24 mg/dL Final   Creatinine, Urine  Date Value Ref Range Status  08/15/2023 38 20 - 320 mg/dL Final         Failed - Cl in normal range and within 180 days    Chloride  Date Value Ref Range Status  02/27/2023 98 98 - 111 mmol/L Final         Failed - Mg Level in normal range and within 180 days    Magnesium   Date Value Ref Range Status  10/01/2020 2.3 1.7 - 2.4 mg/dL Final    Comment:    Performed at [facility], [address] [place], [place] [address]         Passed - Last BP in normal range    BP Readings from Last 1 Encounters:  10/11/23 (!) 118/56         Passed - Valid encounter within last 6 months    Recent Outpatient Visits           2 months ago Hypertension, unspecified type   [facility] Rockney Cid, DO       Future Appointments             In 3  months Rockney Cid, DO [facility], [facility]             potassium chloride  (KLOR-CON ) 10 MEQ tablet [Pharmacy Med Name: potassium chloride  ER 10 mEq tablet,extended release] 540 tablet 0    Sig: TAKE SIX TABLETS BY MOUTH THREE TIMES DAILY     Endocrinology:  Minerals - Potassium Supplementation Failed - 10/30/2023  1:10 PM      Failed - Cr in normal range and within 360 days    Creatinine, Ser  Date Value Ref Range Status  02/27/2023 2.36 (H) 0.61 - 1.24 mg/dL Final   Creatinine, Urine  Date Value Ref Range Status  08/15/2023 38 20 - 320 mg/dL Final         Passed - K in normal range and within 360 days    Potassium  Date Value Ref  Range Status  02/27/2023 3.9 3.5 - 5.1 mmol/L Final         Passed - Valid encounter within last 12 months    Recent Outpatient Visits           2 months ago Hypertension, unspecified type   [facility] Rockney Cid, DO       Future Appointments             In 3 months Rockney Cid, DO [facility], [facility]

## 2023-11-01 ENCOUNTER — Encounter

## 2023-11-02 NOTE — Progress Notes (Signed)
  Subjective:  Patient ID: [name], male    DOB: [birth year],  MRN: [id number]    73 y.o. male returns for at risk diabetic foot care overall doing well.  Doing well no new issues nails are thickened elongated causing pain and discomfort.  Review of Systems: Negative except as noted in the HPI. Denies N/V/F/Ch.   Objective:  There were no vitals filed for this visit. There is no height or weight on file to calculate BMI. Constitutional Well developed. Well nourished.  Vascular Foot warm and well perfused. Capillary refill normal to all digits.   Neurologic Normal speech. Oriented to person, place, and time. Epicritic sensation to light touch grossly reduced bilaterally.  Dermatologic No recurrence of wound or ulceration.  He has thickened elongated toenails x10 bilaterally with subungual brown and yellow discoloration.  Minimal callus today  Orthopedic: Good strength and ROM    Assessment:   1. Pain due to onychomycosis of toenails of both feet       Plan:  Patient was evaluated and treated and all questions answered.   Discussed the etiology and treatment options for the condition in detail with the patient.  Recommended debridement of the nails today. Sharp and mechanical debridement performed of all painful and mycotic nails today. Nails debrided in length and thickness using a nail nipper to level of comfort. Discussed treatment options including appropriate shoe gear. Follow up as needed for painful nails.  Some iatrogenic bleeding on debridement today these were dressed with Neosporin and bandage          Return in about 10 weeks (around 01/08/2024) for at risk diabetic foot care.

## 2023-11-07 ENCOUNTER — Telehealth: Payer: Self-pay | Admitting: Internal Medicine

## 2023-11-07 ENCOUNTER — Ambulatory Visit

## 2023-11-07 NOTE — Telephone Encounter (Signed)
 Copied from CRM [id number]. Topic: Appointments - Scheduling Inquiry for Clinic >> Nov 07, 2023  3:52 PM Bearl Botts E wrote: Reason for CRM: Pt needs to reschedule his missed appt for [facility].

## 2023-11-21 NOTE — Telephone Encounter (Signed)
 Called patient to reschedule awv. No answer. Left vm w/ direct dial  info. Sally Crazier, CMA  [facility] AWV Team Direct Dial : [phone]

## 2023-11-27 ENCOUNTER — Other Ambulatory Visit: Payer: Self-pay | Admitting: Internal Medicine

## 2023-11-28 NOTE — Telephone Encounter (Signed)
 Requested medication (s) are due for refill today:   Yes  Requested medication (s) are on the active medication list:   Yes  Future visit scheduled:   Yes 02/14/2024   LOV 08/15/2023     Appt on 5/30 and 6/5 were No Shows   Last ordered: 10/11/2023 #180, 0 refills  Unable to refill because labs are due.   Requested Prescriptions  Pending Prescriptions Disp Refills   torsemide  (DEMADEX ) 20 MG tablet [Pharmacy Med Name: torsemide  20 mg tablet] 180 tablet 0    Sig: TAKE TWO TABLETS BY MOUTH TWICE DAILY     Cardiovascular:  Diuretics - Loop Failed - 11/28/2023  2:24 PM      Failed - K in normal range and within 180 days    Potassium  Date Value Ref Range Status  02/27/2023 3.9 3.5 - 5.1 mmol/L Final         Failed - Ca in normal range and within 180 days    Calcium   Date Value Ref Range Status  02/27/2023 9.6 8.9 - 10.3 mg/dL Final         Failed - Na in normal range and within 180 days    Sodium  Date Value Ref Range Status  02/27/2023 140 135 - 145 mmol/L Final  01/11/2021 137 134 - 144 mmol/L Final         Failed - Cr in normal range and within 180 days    Creatinine, Ser  Date Value Ref Range Status  02/27/2023 2.36 (H) 0.61 - 1.24 mg/dL Final   Creatinine, Urine  Date Value Ref Range Status  08/15/2023 38 20 - 320 mg/dL Final         Failed - Cl in normal range and within 180 days    Chloride  Date Value Ref Range Status  02/27/2023 98 98 - 111 mmol/L Final         Failed - Mg Level in normal range and within 180 days    Magnesium   Date Value Ref Range Status  10/01/2020 2.3 1.7 - 2.4 mg/dL Final    Comment:    Performed at [facility], [address] [place], [place] [address]         Passed - Last BP in normal range    BP Readings from Last 1 Encounters:  10/11/23 (!) 118/56         Passed - Valid encounter within last 6 months    Recent Outpatient Visits           3 months ago Hypertension, unspecified type   [facility] Bernardo Fend, DO       Future Appointments             In 2 months Bernardo Fend, DO [facility], [facility]

## 2023-11-28 NOTE — Telephone Encounter (Signed)
 Attempted to schedule missed AWV. No answer. Left vm with call back info. Sent patient a Clinical cytogeneticist message which included direct dial  info Stefano ORN, CMA  [facility] AWV Team Direct Dial : [phone]

## 2023-11-29 DIAGNOSIS — I152 Hypertension secondary to endocrine disorders: Secondary | ICD-10-CM | POA: Diagnosis not present

## 2023-11-29 DIAGNOSIS — E1159 Type 2 diabetes mellitus with other circulatory complications: Secondary | ICD-10-CM | POA: Diagnosis not present

## 2023-11-29 DIAGNOSIS — E1169 Type 2 diabetes mellitus with other specified complication: Secondary | ICD-10-CM | POA: Diagnosis not present

## 2023-11-29 DIAGNOSIS — E039 Hypothyroidism, unspecified: Secondary | ICD-10-CM | POA: Diagnosis not present

## 2023-11-29 DIAGNOSIS — E1142 Type 2 diabetes mellitus with diabetic polyneuropathy: Secondary | ICD-10-CM | POA: Diagnosis not present

## 2023-11-29 DIAGNOSIS — E785 Hyperlipidemia, unspecified: Secondary | ICD-10-CM | POA: Diagnosis not present

## 2023-12-14 ENCOUNTER — Other Ambulatory Visit: Payer: Self-pay | Admitting: Internal Medicine

## 2023-12-16 NOTE — Telephone Encounter (Signed)
 Called pharmacy to cancel refill - refilled in error. Labs are overdue. Refill is cancelled. Will forward to provider to review.

## 2023-12-16 NOTE — Telephone Encounter (Signed)
 Requested Prescriptions  Pending Prescriptions Disp Refills   torsemide  (DEMADEX ) 20 MG tablet [Pharmacy Med Name: torsemide  20 mg tablet] 180 tablet 0    Sig: TAKE TWO TABLETS BY MOUTH TWICE DAILY     Cardiovascular:  Diuretics - Loop Failed - 12/16/2023  3:24 PM      Failed - K in normal range and within 180 days    Potassium  Date Value Ref Range Status  02/27/2023 3.9 3.5 - 5.1 mmol/L Final         Failed - Ca in normal range and within 180 days    Calcium   Date Value Ref Range Status  02/27/2023 9.6 8.9 - 10.3 mg/dL Final         Failed - Na in normal range and within 180 days    Sodium  Date Value Ref Range Status  02/27/2023 140 135 - 145 mmol/L Final  01/11/2021 137 134 - 144 mmol/L Final         Failed - Cr in normal range and within 180 days    Creatinine, Ser  Date Value Ref Range Status  02/27/2023 2.36 (H) 0.61 - 1.24 mg/dL Final   Creatinine, Urine  Date Value Ref Range Status  08/15/2023 38 20 - 320 mg/dL Final         Failed - Cl in normal range and within 180 days    Chloride  Date Value Ref Range Status  02/27/2023 98 98 - 111 mmol/L Final         Failed - Mg Level in normal range and within 180 days    Magnesium   Date Value Ref Range Status  10/01/2020 2.3 1.7 - 2.4 mg/dL Final    Comment:    Performed at [facility], [address] [place], [place] [address]         Passed - Last BP in normal range    BP Readings from Last 1 Encounters:  10/11/23 (!) 118/56         Passed - Valid encounter within last 6 months    Recent Outpatient Visits           4 months ago Hypertension, unspecified type   [facility] Bernardo Fend, DO       Future Appointments             In 2 months Bernardo Fend, DO [facility], [facility]

## 2024-01-08 ENCOUNTER — Ambulatory Visit (INDEPENDENT_AMBULATORY_CARE_PROVIDER_SITE_OTHER): Admitting: Podiatry

## 2024-01-08 ENCOUNTER — Other Ambulatory Visit: Payer: Self-pay | Admitting: Internal Medicine

## 2024-01-08 VITALS — Ht 72.0 in | Wt 242.0 lb

## 2024-01-08 DIAGNOSIS — B351 Tinea unguium: Secondary | ICD-10-CM

## 2024-01-08 DIAGNOSIS — M79675 Pain in left toe(s): Secondary | ICD-10-CM

## 2024-01-08 DIAGNOSIS — M79674 Pain in right toe(s): Secondary | ICD-10-CM | POA: Diagnosis not present

## 2024-01-10 DIAGNOSIS — E119 Type 2 diabetes mellitus without complications: Secondary | ICD-10-CM | POA: Diagnosis not present

## 2024-01-10 NOTE — Telephone Encounter (Signed)
 Too soon for refill, LRF 12/16/23 for 90 days.  Requested Prescriptions  Pending Prescriptions Disp Refills   torsemide  (DEMADEX ) 20 MG tablet [Pharmacy Med Name: torsemide  20 mg tablet] 180 tablet 0    Sig: TAKE TWO TABLETS BY MOUTH TWICE DAILY     Cardiovascular:  Diuretics - Loop Failed - 01/10/2024 11:34 AM      Failed - K in normal range and within 180 days    Potassium  Date Value Ref Range Status  02/27/2023 3.9 3.5 - 5.1 mmol/L Final         Failed - Ca in normal range and within 180 days    Calcium   Date Value Ref Range Status  02/27/2023 9.6 8.9 - 10.3 mg/dL Final         Failed - Na in normal range and within 180 days    Sodium  Date Value Ref Range Status  02/27/2023 140 135 - 145 mmol/L Final  01/11/2021 137 134 - 144 mmol/L Final         Failed - Cr in normal range and within 180 days    Creatinine, Ser  Date Value Ref Range Status  02/27/2023 2.36 (H) 0.61 - 1.24 mg/dL Final   Creatinine, Urine  Date Value Ref Range Status  08/15/2023 38 20 - 320 mg/dL Final         Failed - Cl in normal range and within 180 days    Chloride  Date Value Ref Range Status  02/27/2023 98 98 - 111 mmol/L Final         Failed - Mg Level in normal range and within 180 days    Magnesium   Date Value Ref Range Status  10/01/2020 2.3 1.7 - 2.4 mg/dL Final    Comment:    Performed at [facility], [address] [place], [place] [address]         Passed - Last BP in normal range    BP Readings from Last 1 Encounters:  10/11/23 (!) 118/56         Passed - Valid encounter within last 6 months    Recent Outpatient Visits           4 months ago Hypertension, unspecified type   [facility] Bernardo Fend, DO       Future Appointments             In 1 month Bernardo Fend, DO [facility], [facility]

## 2024-01-10 NOTE — Progress Notes (Signed)
  Subjective:  Patient ID: [name], male    DOB: [birth year],  MRN: [id number]    73 y.o. male returns for at risk diabetic foot care overall doing well.  Doing well no new issues nails are thickened elongated causing pain and discomfort.  Review of Systems: Negative except as noted in the HPI. Denies N/V/F/Ch.   Objective:  There were no vitals filed for this visit. Body mass index is 32.82 kg/m. Constitutional Well developed. Well nourished.  Vascular Foot warm and well perfused. Capillary refill normal to all digits.   Neurologic Normal speech. Oriented to person, place, and time. Epicritic sensation to light touch grossly reduced bilaterally.  Dermatologic No recurrence of wound or ulceration.  He has thickened elongated toenails x10 bilaterally with subungual brown and yellow discoloration.  Minimal callus today  Orthopedic: Good strength and ROM    Assessment:   1. Pain due to onychomycosis of toenails of both feet       Plan:  Patient was evaluated and treated and all questions answered.   Discussed the etiology and treatment options for the condition in detail with the patient.  Recommended debridement of the nails today. Sharp and mechanical debridement performed of all painful and mycotic nails today. Nails debrided in length and thickness using a nail nipper to level of comfort. Discussed treatment options including appropriate shoe gear. Follow up as needed for painful nails.  Some iatrogenic bleeding on debridement dressed with Neosporin and bandage          Return in about 9 weeks (around 03/11/2024) for at risk diabetic foot care.

## 2024-01-19 ENCOUNTER — Other Ambulatory Visit: Payer: Self-pay | Admitting: Internal Medicine

## 2024-01-19 DIAGNOSIS — I2510 Atherosclerotic heart disease of native coronary artery without angina pectoris: Secondary | ICD-10-CM

## 2024-01-21 NOTE — Telephone Encounter (Signed)
 2nd request

## 2024-01-21 NOTE — Telephone Encounter (Signed)
 Labs in date plus has appt coming up in Sept. 2025.     Requested Prescriptions  Pending Prescriptions Disp Refills   rosuvastatin  (CRESTOR ) 20 MG tablet [Pharmacy Med Name: rosuvastatin  20 mg tablet] 90 tablet 0    Sig: TAKE ONE TABLET BY MOUTH ONCE DAILY     Cardiovascular:  Antilipid - Statins 2 Failed - 01/21/2024  3:48 PM      Failed - Cr in normal range and within 360 days    Creatinine, Ser  Date Value Ref Range Status  02/27/2023 2.36 (H) 0.61 - 1.24 mg/dL Final   Creatinine, Urine  Date Value Ref Range Status  08/15/2023 38 20 - 320 mg/dL Final         Failed - Lipid Panel in normal range within the last 12 months    Cholesterol  Date Value Ref Range Status  02/27/2023 138 0 - 200 mg/dL Final   LDL Cholesterol  Date Value Ref Range Status  02/27/2023 40 0 - 99 mg/dL Final    Comment:           Total Cholesterol/HDL:CHD Risk Coronary Heart Disease Risk Table                     Men   Women  1/2 Average Risk   3.4   3.3  Average Risk       5.0   4.4  2 X Average Risk   9.6   7.1  3 X Average Risk  23.4   11.0        Use the calculated Patient Ratio above and the CHD Risk Table to determine the patient's CHD Risk.        ATP III CLASSIFICATION (LDL):  <100     mg/dL   Optimal  899-870  mg/dL   Near or Above                    Optimal  130-159  mg/dL   Borderline  839-810  mg/dL   High  >809     mg/dL   Very High Performed at [facility], [address] [place], [place] [address]    HDL  Date Value Ref Range Status  02/27/2023 32 (L) >40 mg/dL Final   Triglycerides  Date Value Ref Range Status  02/27/2023 329 (H) <150 mg/dL Final         Passed - Patient is not pregnant      Passed - Valid encounter within last 12 months    Recent Outpatient Visits           5 months ago Hypertension, unspecified type   [facility] Bernardo Fend, DO       Future Appointments             In 3 weeks Bernardo Fend, DO [facility], [facility]

## 2024-01-24 NOTE — Progress Notes (Signed)
 Pharmacy Quality Measure Review  This patient is appearing on a report for being at risk of failing the adherence measure for hypertension (ACEi/ARB) medications this calendar year.   Medication: losartan  25 mg Last fill date: 01/21/24 for 30 day supply  Insurance report was not up to date. No action needed at this time.   Eisley Barber E. Marsh, PharmD Clinical Pharmacist [facility] [phone]

## 2024-02-05 ENCOUNTER — Other Ambulatory Visit: Payer: Self-pay | Admitting: Internal Medicine

## 2024-02-05 NOTE — Telephone Encounter (Signed)
 Requested medication (s) are due for refill today:   Yes  Requested medication (s) are on the active medication list:   Yes  Future visit scheduled:   Yes 9/12   Last ordered: 12/16/2023 #180, 0 refills  Unable to refill because labs are due   Requested Prescriptions  Pending Prescriptions Disp Refills   torsemide  (DEMADEX ) 20 MG tablet [Pharmacy Med Name: torsemide  20 mg tablet] 180 tablet 0    Sig: TAKE TWO TABLETS BY MOUTH TWICE DAILY     Cardiovascular:  Diuretics - Loop Failed - 02/05/2024  3:36 PM      Failed - K in normal range and within 180 days    Potassium  Date Value Ref Range Status  02/27/2023 3.9 3.5 - 5.1 mmol/L Final         Failed - Ca in normal range and within 180 days    Calcium   Date Value Ref Range Status  02/27/2023 9.6 8.9 - 10.3 mg/dL Final         Failed - Na in normal range and within 180 days    Sodium  Date Value Ref Range Status  02/27/2023 140 135 - 145 mmol/L Final  01/11/2021 137 134 - 144 mmol/L Final         Failed - Cr in normal range and within 180 days    Creatinine, Ser  Date Value Ref Range Status  02/27/2023 2.36 (H) 0.61 - 1.24 mg/dL Final   Creatinine, Urine  Date Value Ref Range Status  08/15/2023 38 20 - 320 mg/dL Final         Failed - Cl in normal range and within 180 days    Chloride  Date Value Ref Range Status  02/27/2023 98 98 - 111 mmol/L Final         Failed - Mg Level in normal range and within 180 days    Magnesium   Date Value Ref Range Status  10/01/2020 2.3 1.7 - 2.4 mg/dL Final    Comment:    Performed at [facility], [address] [place], [place] [address]         Passed - Last BP in normal range    BP Readings from Last 1 Encounters:  10/11/23 (!) 118/56         Passed - Valid encounter within last 6 months    Recent Outpatient Visits           5 months ago Hypertension, unspecified type   [facility] Bernardo Fend, DO       Future  Appointments             In 1 week Bernardo Fend, DO [facility], [place]

## 2024-02-11 ENCOUNTER — Other Ambulatory Visit: Payer: Self-pay | Admitting: Internal Medicine

## 2024-02-11 DIAGNOSIS — I5022 Chronic systolic (congestive) heart failure: Secondary | ICD-10-CM

## 2024-02-11 DIAGNOSIS — I480 Paroxysmal atrial fibrillation: Secondary | ICD-10-CM

## 2024-02-12 NOTE — Telephone Encounter (Signed)
 Requested Prescriptions  Pending Prescriptions Disp Refills   metolazone  (ZAROXOLYN ) 2.5 MG tablet [Pharmacy Med Name: metolazone  2.5 mg tablet] 90 tablet 0    Sig: TAKE ONE TABLET BY MOUTH ONCE DAILY     Cardiovascular:  Diuretics - Thiazide - metolazone  Failed - 02/12/2024 10:59 AM      Failed - Na in normal range and within 180 days    Sodium  Date Value Ref Range Status  02/27/2023 140 135 - 145 mmol/L Final  01/11/2021 137 134 - 144 mmol/L Final         Failed - K in normal range and within 180 days    Potassium  Date Value Ref Range Status  02/27/2023 3.9 3.5 - 5.1 mmol/L Final         Failed - Cr in normal range and within 180 days    Creatinine, Ser  Date Value Ref Range Status  02/27/2023 2.36 (H) 0.61 - 1.24 mg/dL Final   Creatinine, Urine  Date Value Ref Range Status  08/15/2023 38 20 - 320 mg/dL Final         Failed - Valid encounter within last 6 months    Recent Outpatient Visits           6 months ago Hypertension, unspecified type   [facility] Bernardo Fend, DO       Future Appointments             In 2 days Bernardo Fend, DO [facility], [place]            Passed - Last BP in normal range    BP Readings from Last 1 Encounters:  10/11/23 (!) 118/56          amiodarone  (PACERONE ) 200 MG tablet [Pharmacy Med Name: amiodarone  200 mg tablet] 90 tablet 0    Sig: TAKE ONE TABLET BY MOUTH ONCE DAILY     Not Delegated - Cardiovascular: Antiarrhythmic Agents - amiodarone  Failed - 02/12/2024 10:59 AM      Failed - This refill cannot be delegated      Failed - Manual Review: Eye exam recommended every 12 months      Failed - TSH in normal range and within 360 days    TSH  Date Value Ref Range Status  02/27/2023 0.224 (L) 0.350 - 4.500 uIU/mL Final    Comment:    Performed by a 3rd Generation assay with a functional sensitivity of <=0.01 uIU/mL. Performed at [facility], [address] [place], [place] [address]   08/26/2020 14.400 (H) 0.450 - 4.500 uIU/mL Final         Failed - Mg Level in normal range and within 360 days    Magnesium   Date Value Ref Range Status  10/01/2020 2.3 1.7 - 2.4 mg/dL Final    Comment:    Performed at [facility], [address] [place], [place] [address]         Failed - K in normal range and within 180 days    Potassium  Date Value Ref Range Status  02/27/2023 3.9 3.5 - 5.1 mmol/L Final         Failed - AST in normal range and within 180 days    AST  Date Value Ref Range Status  02/27/2023 22 15 - 41 U/L Final         Failed - ALT in normal range and within 180 days  ALT  Date Value Ref Range Status  02/27/2023 21 0 - 44 U/L Final         Failed - Valid encounter within last 6 months    Recent Outpatient Visits           6 months ago Hypertension, unspecified type   [facility] Bernardo Fend, DO       Future Appointments             In 2 days Bernardo Fend, DO  [facility], [place]            Failed - Patient had chest x-ray within the last 6 months      Passed - Patient had ECG in the last 180 days      Passed - Patient is not pregnant      Passed - Last BP in normal range    BP Readings from Last 1 Encounters:  10/11/23 (!) 118/56         Passed - Last Heart Rate in normal range    Pulse Readings from Last 1 Encounters:  10/11/23 65

## 2024-02-12 NOTE — Telephone Encounter (Signed)
 Requested medication (s) are due for refill today: yes  Requested medication (s) are on the active medication list: yes  Last refill:  06/19/23  Future visit scheduled: yes  Notes to clinic:  Unable to refill per protocol, cannot delegate.      Requested Prescriptions  Pending Prescriptions Disp Refills   amiodarone  (PACERONE ) 200 MG tablet [Pharmacy Med Name: amiodarone  200 mg tablet] 90 tablet 0    Sig: TAKE ONE TABLET BY MOUTH ONCE DAILY     Not Delegated - Cardiovascular: Antiarrhythmic Agents - amiodarone  Failed - 02/12/2024 11:00 AM      Failed - This refill cannot be delegated      Failed - Manual Review: Eye exam recommended every 12 months      Failed - TSH in normal range and within 360 days    TSH  Date Value Ref Range Status  02/27/2023 0.224 (L) 0.350 - 4.500 uIU/mL Final    Comment:    Performed by a 3rd Generation assay with a functional sensitivity of <=0.01 uIU/mL. Performed at [facility], [address] [place], [place] [address]   08/26/2020 14.400 (H) 0.450 - 4.500 uIU/mL Final         Failed - Mg Level in normal range and within 360 days    Magnesium   Date Value Ref Range Status  10/01/2020 2.3 1.7 - 2.4 mg/dL Final    Comment:    Performed at [facility], [address] [place], [place] [address]         Failed - K in normal range and within 180 days    Potassium  Date Value Ref Range Status  02/27/2023 3.9 3.5 - 5.1 mmol/L Final         Failed - AST in normal range and within 180 days    AST  Date Value Ref Range Status  02/27/2023 22 15 - 41 U/L Final         Failed - ALT in normal range and within 180 days    ALT  Date Value Ref Range Status  02/27/2023 21 0 - 44 U/L Final         Failed - Valid encounter within last 6 months    Recent Outpatient Visits           6 months ago Hypertension, unspecified type   [facility] Bernardo Fend, DO       Future Appointments              In 2 days Bernardo Fend, DO [place] [facility], [place]            Failed - Patient had chest x-ray within the last 6 months      Passed - Patient had ECG in the last 180 days      Passed - Patient is not pregnant      Passed - Last BP in normal range    BP Readings from Last 1 Encounters:  10/11/23 (!) 118/56         Passed - Last Heart Rate in normal range    Pulse Readings from Last 1 Encounters:  10/11/23 65         Signed Prescriptions Disp Refills   metolazone  (ZAROXOLYN ) 2.5 MG tablet 90 tablet 0    Sig: TAKE ONE TABLET BY MOUTH ONCE DAILY     Cardiovascular:  Diuretics - Thiazide - metolazone  Failed - 02/12/2024 11:00 AM  Failed - Na in normal range and within 180 days    Sodium  Date Value Ref Range Status  02/27/2023 140 135 - 145 mmol/L Final  01/11/2021 137 134 - 144 mmol/L Final         Failed - K in normal range and within 180 days    Potassium  Date Value Ref Range Status  02/27/2023 3.9 3.5 - 5.1 mmol/L Final         Failed - Cr in normal range and within 180 days    Creatinine, Ser  Date Value Ref Range Status  02/27/2023 2.36 (H) 0.61 - 1.24 mg/dL Final   Creatinine, Urine  Date Value Ref Range Status  08/15/2023 38 20 - 320 mg/dL Final         Failed - Valid encounter within last 6 months    Recent Outpatient Visits           6 months ago Hypertension, unspecified type   [facility] Bernardo Fend, DO       Future Appointments             In 2 days Bernardo Fend, DO [place] [facility], [place]            Passed - Last BP in normal range    BP Readings from Last 1 Encounters:  10/11/23 (!) 118/56

## 2024-02-14 ENCOUNTER — Ambulatory Visit: Admitting: Internal Medicine

## 2024-02-14 ENCOUNTER — Other Ambulatory Visit: Payer: Self-pay

## 2024-02-14 VITALS — BP 124/82 | HR 84 | Resp 18 | Ht 72.0 in | Wt 224.2 lb

## 2024-02-14 DIAGNOSIS — M545 Low back pain, unspecified: Secondary | ICD-10-CM

## 2024-02-14 DIAGNOSIS — E118 Type 2 diabetes mellitus with unspecified complications: Secondary | ICD-10-CM

## 2024-02-14 DIAGNOSIS — L603 Nail dystrophy: Secondary | ICD-10-CM | POA: Diagnosis not present

## 2024-02-14 DIAGNOSIS — N184 Chronic kidney disease, stage 4 (severe): Secondary | ICD-10-CM

## 2024-02-14 DIAGNOSIS — I480 Paroxysmal atrial fibrillation: Secondary | ICD-10-CM

## 2024-02-14 DIAGNOSIS — E039 Hypothyroidism, unspecified: Secondary | ICD-10-CM

## 2024-02-14 DIAGNOSIS — Z794 Long term (current) use of insulin: Secondary | ICD-10-CM | POA: Diagnosis not present

## 2024-02-14 DIAGNOSIS — Z23 Encounter for immunization: Secondary | ICD-10-CM | POA: Diagnosis not present

## 2024-02-14 DIAGNOSIS — I272 Pulmonary hypertension, unspecified: Secondary | ICD-10-CM

## 2024-02-14 DIAGNOSIS — G4700 Insomnia, unspecified: Secondary | ICD-10-CM

## 2024-02-14 DIAGNOSIS — I5032 Chronic diastolic (congestive) heart failure: Secondary | ICD-10-CM | POA: Diagnosis not present

## 2024-02-14 LAB — POCT URINALYSIS DIPSTICK
Bilirubin, UA: NEGATIVE
Blood, UA: NEGATIVE
Glucose, UA: POSITIVE — AB
Ketones, UA: NEGATIVE
Nitrite, UA: NEGATIVE
Protein, UA: NEGATIVE
Spec Grav, UA: 1.02 (ref 1.010–1.025)
Urobilinogen, UA: 0.2 U/dL
pH, UA: 5 (ref 5.0–8.0)

## 2024-02-14 MED ORDER — TRAZODONE HCL 100 MG PO TABS: 100.0000 mg | ORAL_TABLET | Freq: Every day | ORAL | 1 refills | Status: AC

## 2024-02-14 MED ORDER — TIZANIDINE HCL 4 MG PO TABS: 4.0000 mg | ORAL_TABLET | Freq: Every evening | ORAL | 0 refills | Status: DC | PRN

## 2024-02-14 NOTE — Progress Notes (Signed)
 Established Patient Office Visit  Subjective    Patient ID: [name], male    DOB: [birth year]  Age: 73 y.o. MRN: [id number]  CC:  Chief Complaint  Patient presents with   Medical Management of Chronic Issues    6 month recheck    HPI Dontai Pember [facility] presents to follow up on chronic medical conditions.   Discussed the use of AI scribe software for clinical note transcription with the patient, who gave verbal consent to proceed.  History of Present Illness [name] is a 73 year old male who presents with acute low back pain.  Two days ago, he experienced severe, localized low back pain after lifting a five-gallon water  jug. He used a heating pad and topical creams, including capsaicin and a frankincense and myrrh foot cream, without relief. Cyclobenzaprine  was ineffective. He suspects a kidney stone due to similar pain localization and response to heat as previous episodes.  He notes darker urine over the past two to three days, attributing it to decreased water  intake. There is no hematuria.  Current medications include Coreg  3.125 mg twice daily, losartan , Jardiance  10 mg, torsemide  40 mg once daily, and levothyroxine  25 mcg. He reduced Toujeo  insulin  from 100 units to 30 units daily due to improved blood sugar control. [name] part of a clinical trial for a sleep medication, taking 50 mg at bedtime. He has a history of fluid retention managed with torsemide  and metaxalone.   Hypertension/CHF/A.Fib/NICM/OSA: -Medications: Amiodarone  200 mg, Eliquis  5 mg BID, Coreg  3.125 mg BID, Losartan  12.5 mg, Jardiance  10 mg, KCl 40 meQ TID, Torsemide  40 mg daily (extra 20 mg as needed), Metolazone  2.5 mg -Patient is compliant with above medications and reports no side effects. -Checking BP at home (average): not checking -Denies any SOB, CP, vision changes, LE edema or symptoms of hypotension -Following with Cardiology -Last echo 9/22 with improved EF to 40-45% -Compliant  with CPAP  HLD/Hx of MI/CAD: -Medications: Crestor  20 mg -Patient is compliant with above medications and reports no side effects.  -Had one bare-metal cardiac stent placed in 1998 -Last cardiac catheterization in 2022 with moderate and nonobstructive CAD -Last lipid panel: 6/25 TC  110, triglycerides 288, LDL 15, HDL 37  Diabetes, Type 2: -Follows with Dr. Cherilyn -Last A1c 5.3% 6/25 -Medications: Glipizide  20 mg once daily, Jardiance  10 mg, Mounjaro  15 mg weekly, Toujeo  decreased to 30 units daily  -Patient is compliant with the above medications and reports no side effects.  -Checking BG at home: Dexcom 7  -Eye exam: UTD - follows with Ophthalmology at [facility], usually follows with her twice a year. Also has a history of glaucoma but was fixed surgically with lasers.   -Foot exam: UTD 3/25, follows with podiatry Dr. Silva -Microalbumin: Due -Statin: yes -PNA vaccine: UTD, Prevnar 20 at pharmacy  -Denies symptoms of hypoglycemia, polyuria, polydipsia, numbness extremities, foot ulcers/trauma.   CKD4/History of Nephrolithiasis: -Last labs from 6/25 2.3, GFR 30 -Following with Nephrology -History of left nephrectomy in 2019 - due to hydronephrosis from obstruction of stones    Hypothyroidism: -Currently on Levothyroxine  decreased to 25 mcg  -TSH 6/25 5.207  Recurrent Fever Blisters: -Currently on Valtrex PRN  Anxiety: -Had been on Klonopin  1 mg at night - has been on for 5 years weaned off successfully -Now on Trazodone  50 mg for sleep  Health Maintenance: -Blood work UTD   Outpatient Encounter Medications as of 02/14/2024  Medication Sig   amiodarone  (PACERONE ) 200 MG  tablet TAKE ONE TABLET BY MOUTH ONCE DAILY   apixaban  (ELIQUIS ) 5 MG TABS tablet Take 1 tablet (5 mg total) by mouth 2 (two) times daily.   Bromelains (BROMELAIN PO) Take 2 capsules by mouth daily.   carvedilol  (COREG ) 3.125 MG tablet TAKE ONE TABLET BY MOUTH TWICE DAILY WITH FOOD   CINNAMON  PO Take  1,200 mg by mouth in the morning and at bedtime. [place] Cinnamon  1200mg    Coenzyme Q10 (COQ10) 400 MG CAPS Take 400 mg by mouth 2 (two) times daily.   docusate sodium  (COLACE) 100 MG capsule 2 (two) times daily. Take 300 mg in the pm & 200 mg in the am.   glipiZIDE  (GLUCOTROL ) 10 MG tablet TAKE ONE TABLET BY MOUTH TWICE DAILY   HAWTHORNE BERRY PO Take 565 mg by mouth at bedtime.   Insulin  Glargine (TOUJEO  SOLOSTAR [place]) Inject 100 Units into the skin as needed.   JARDIANCE  10 MG TABS tablet Take 1 tablet (10 mg total) by mouth every evening.   ketoconazole  (NIZORAL ) 2 % shampoo Apply 1 Application topically 2 (two) times a week.   levothyroxine  (SYNTHROID ) 25 MCG tablet TAKE ONE TABLET BY MOUTH ONCE DAILY   losartan  (COZAAR ) 25 MG tablet Take 0.5 tablets (12.5 mg total) by mouth daily.   magnesium  oxide (MAG-OX) 400 (240 Mg) MG tablet TAKE ONE TABLET BY MOUTH ONCE DAILY AS NEEDED   MELATONIN-THEANINE PO Take 2 capsules by mouth at bedtime. Olly Sleep Gummies   metolazone  (ZAROXOLYN ) 2.5 MG tablet TAKE ONE TABLET BY MOUTH ONCE DAILY   mometasone  (ELOCON ) 0.1 % cream Apply 1 Application topically daily. To areas of psoriasis   Multiple Vitamins-Minerals (PRESERVISION AREDS 2 PO) Take 1 tablet by mouth in the morning and at bedtime.   potassium chloride  (KLOR-CON ) 10 MEQ tablet TAKE SIX TABLETS BY MOUTH THREE TIMES DAILY   rosuvastatin  (CRESTOR ) 20 MG tablet TAKE ONE TABLET BY MOUTH ONCE DAILY   tirzepatide  (MOUNJARO ) 12.5 MG/0.5ML Pen Inject 12.5 mg into the skin once a week.   torsemide  (DEMADEX ) 20 MG tablet TAKE TWO TABLETS BY MOUTH TWICE DAILY   traZODone  (DESYREL ) 50 MG tablet Take 1 tablet (50 mg total) by mouth at bedtime.   valACYclovir (VALTREX) 1000 MG tablet Take 1,000 mg by mouth daily as needed.   vitamin C  (ASCORBIC ACID ) 500 MG tablet Take 500 mg by mouth at bedtime.   zinc gluconate 50 MG tablet Take 50 mg by mouth daily.   B-D ULTRAFINE III SHORT PEN 31G X 8 MM MISC 2 (two) times  daily as needed.   Torsemide  40 MG TABS Take 40 mg by mouth as directed. Take 40 mg once daily with extra 20 mg as needed for weight greater than 275   No facility-administered encounter medications on file as of 02/14/2024.    Past Medical History:  Diagnosis Date   Allergy [phone]   Anxiety    Mild - Intermiteant   Cataract    early stage   CHF (congestive heart failure) (HCC)    CKD (chronic kidney disease) stage 3, GFR 30-59 ml/min (HCC)    Coronary artery disease    a. 1998 s/p ACS Multi-link BMS to the prox LAD ([place]); b. 07/2015 Cath: LM nl, LAD patent stent, LCX min irregs, OM1/2 min irregs, OM3 nl, RCA min irregs; c. 07/2020 Cath: LM nl, LAD 50p/m, 70d, D2 70, LCX 40p/15m, OM3 40, RCA 60d->Med Rx.   Depression    Intermiteant   Diabetes mellitus  type 2, uncontrolled    a. A1c 11.0 in 07/2015.   Glaucoma    Resolved with laser   HFrEF    a. EF 25% by cath in 2013; b. Echo in 07/2015 showing EF of 15-20%, moderate MR, moderate Pulm HTN, severely dilated IVC; c. 11/2016 Echo: EF 55-60%, no rwma, Gr1 DD, mildly dil LA, nl RV fxn. d. 12/2019 Echo: EF 40-45%, e. 07/2020 Echo: EF 20-25%, sev red RV fxx; f. 02/2021 Echo: EF 40-45%, glob HK/signif sept HK. Nl RV fxn.   History of kidney stones    Hypertension    Hypothyroidism 08/15/2023   Ischemic cardiomyopathy    Kidney stones    Left   Myocardial infarction [facility])    Paroxysmal atrial fibrillation (HCC)    a. Dx 07/2015; b. CHA2DS2VASc = 5-->eliquis . Rhythm control w/ amiodarone .   Pollen allergies 09/21/2015   pt called and stated that he woke up and had some drainage-pt states he did not see the color of the drainage-pt denies running a fever and this only happened once-pt instructed to call Dr Geneva office if he starts running a fever or if the color of drainage becomes yellow/green   Psoriasis    Pulmonary hypertension (HCC)    Sleep apnea     Past Surgical History:  Procedure Laterality Date   CARDIAC  CATHETERIZATION N/A 07/25/2015   Procedure: Right/Left Heart Cath and Coronary Angiography;  Surgeon: Deatrice DELENA Cage, MD;  Location: [facility] INVASIVE [facility];  Service: Cardiovascular;  Laterality: N/A;   CARDIAC CATHE[place]ATION     [place]   CARDIAC CATHETERIZATION     [place], [place]   CORONARY ANGIOPLASTY WITH STENT PLACEMENT     CYSTOSCOPY WITH STENT PLACEMENT Left 09/07/2015   Procedure: CYSTOSCOPY WITH STENT PLACEMENT;  Surgeon: Rosina Riis, MD;  Location: [facility] OR[facility];  Service: Urology;  Laterality: Left;   CYSTOSCOPY/URETEROSCOPY/HOLMIUM LASER/STENT PLACEMENT Left 10/25/2015   Procedure: CYSTOSCOPY/URETEROSCOPY/HOLMIUM LASER/STENT EXCHANGE;  Surgeon: Rosina Riis, MD;  Location: [facility] OR[facility];  Service: Urology;  Laterality: Left;   CYSTOSCOPY/URETEROSCOPY/HOLMIUM LASER/STENT PLACEMENT Left 05/11/2019   Procedure: CYSTOSCOPY/URETEROSCOPY/HOLMIUM LASER/STENT PLACEMENT;  Surgeon: Riis Rosina, MD;  Location: [facility];  Service: Urology;  Laterality: Left;   EXTRACORPOREAL SHOCK WAVE LITHOTRIPSY Left 04/02/2019   Procedure: EXTRACORPOREAL SHOCK WAVE LITHOTRIPSY (ESWL);  Surgeon: Riis Rosina, MD;  Location: [facility];  Service: Urology;  Laterality: Left;   EXTRACORPOREAL SHOCK WAVE LITHOTRIPSY Left 03/05/2019   Procedure: EXTRACORPOREAL SHOCK WAVE LITHOTRIPSY (ESWL);  Surgeon: Twylla Glendia BROCKS, MD;  Location: [facility];  Service: Urology;  Laterality: Left;   EYE SURGERY Bilateral    LASER FOR GLAUCOMA   IRRIGATION AND DEBRIDEMENT FOOT Left 09/30/2020   Procedure: IRRIGATION AND DEBRIDEMENT FOOT;  Surgeon: Silva Juliene SAUNDERS, DPM;  Location: [facility];  Service: Podiatry;  Laterality: Left;   KIDNEY SURGERY     LOWER EXTREMITY ANGIOGRAPHY Left 10/04/2020   Procedure: Lower Extremity Angiography;  Surgeon: Jama Cordella MATSU, MD;  Location: [facility];  Service: Cardiovascular;  Laterality: Left;   NEPHROSTOMY TUBE PLACEMENT ([facility] HX) Left 11/2019   RIGHT/LEFT HEART CATH  AND CORONARY ANGIOGRAPHY N/A 07/12/2020   Procedure: RIGHT/LEFT HEART CATH AND CORONARY ANGIOGRAPHY;  Surgeon: Mady Bruckner, MD;  Location: [facility];  Service: Cardiovascular;  Laterality: N/A;   ROBOT ASSISTED LAPAROSCOPIC NEPHRECTOMY Left 12/10/2019   Procedure: XI ROBOTIC ASSISTED LAPAROSCOPIC NEPHRECTOMY;  Surgeon: Riis Rosina, MD;  Location: [facility];  Service: Urology;  Laterality: Left;   URETEROSCOPY WITH HOLMIUM  LASER LITHOTRIPSY Left 09/07/2015   Procedure: URETEROSCOPY WITH HOLMIUM LASER LITHOTRIPSY;  Surgeon: Rosina Riis, MD;  Location: [facility];  Service: Urology;  Laterality: Left;    Family History  Problem Relation Age of Onset   Heart failure Father    Arthritis Father    Heart disease Father    Hypertension Father    Heart attack Brother    Diabetes Brother    Heart disease Brother    Hypertension Brother    Birth defects Sister    Early death Sister    Early death Brother    Heart disease Brother    Hypertension Brother    Kidney cancer Neg Hx    Bladder Cancer Neg Hx    Prostate cancer Neg Hx     Social History   Socioeconomic History   Marital status: Single    Spouse name: Not on file   Number of children: Not on file   Years of education: Not on file   Highest education level: Some college, no degree  Occupational History   Not on file  Tobacco Use   Smoking status: Never   Smokeless tobacco: Never  Vaping Use   Vaping status: Never Used  Substance and Sexual Activity   Alcohol  use: Not Currently    Comment: less than 1   Drug use: Yes    Types: Cocaine, LSD, Marijuana, Opium     Comment: with the exception of marijuana nothing used in 30 years!   Sexual activity: Not Currently    Birth control/protection: Condom  Other Topics Concern   Not on file  Social History Narrative   Not on file   Social Drivers of Health   Financial Resource Strain: Low Risk  (02/14/2024)   Overall Financial Resource Strain (CARDIA)     Difficulty of Paying Living Expenses: Not very hard  Food Insecurity: No Food Insecurity (02/14/2024)   Hunger Vital Sign    Worried About Running Out of Food in the Last Year: Never true    Ran Out of Food in the Last Year: Never true  Transportation Needs: No Transportation Needs (02/14/2024)   PRAPARE - Administrator, Civil Service (Medical): No    Lack of Transportation (Non-Medical): No  Physical Activity: Insufficiently Active (02/14/2024)   Exercise Vital Sign    Days of Exercise per Week: 1 day    Minutes of Exercise per Session: 20 min  Stress: Stress Concern Present (02/14/2024)   [employer] of Occupational Health - Occupational Stress Questionnaire    Feeling of Stress: To some extent  Social Connections: Moderately Isolated (02/14/2024)   Social Connection and Isolation Panel    Frequency of Communication with Friends and Family: Twice a week    Frequency of Social Gatherings with Friends and Family: Once a week    Attends Religious Services: 1 to 4 times per year    Active Member of [employer] or Organizations: No    Attends Banker Meetings: Not on file    Marital Status: Divorced  Intimate Partner Violence: Not At Risk (09/27/2022)   Humiliation, Afraid, Rape, and Kick questionnaire    Fear of Current or Ex-Partner: No    Emotionally Abused: No    Physically Abused: No    Sexually Abused: No    Review of Systems  Genitourinary:  Positive for flank pain. Negative for dysuria, frequency, hematuria and urgency.  All other systems reviewed and are negative.     Objective  BP 124/82 (Cuff Size: Large)   Pulse 84   Resp 18   Ht 6' (1.829 m)   Wt 224 lb 3.2 oz (101.7 kg)   SpO2 98%   BMI 30.41 kg/m   Physical Exam Constitutional:      Appearance: Normal appearance.  HENT:     Head: Normocephalic and atraumatic.  Eyes:     Conjunctiva/sclera: Conjunctivae normal.  Cardiovascular:     Rate and Rhythm: Normal rate and regular  rhythm.  Pulmonary:     Effort: Pulmonary effort is normal.     Breath sounds: Normal breath sounds.  Skin:    General: Skin is warm and dry.  Neurological:     General: No focal deficit present.     Mental Status: [name] alert. Mental status is at baseline.  Psychiatric:        Mood and Affect: Mood normal.        Behavior: Behavior normal.         Assessment & Plan:   Assessment & Plan Low back pain Acute musculoskeletal strain likely due to lifting heavy objects. Differential includes nephrolithiasis, but darker urine may be due to decreased fluid intake. - Order urine test for hematuria (negative for hematuria or signs of infection). - Prescribe tizanidine  at bedtime. - Advise heating pad and gentle stretching. - Continue topical medications.  Type 2 diabetes mellitus Well-managed with good A1c levels. Insulin  reduced due to effective management with Mounjaro . Endocrinologist pleased with management. - Continue current diabetes management.  Heart failure Well-managed. Fluid retention controlled with diuretics. Metaxalone dosage adjustable based on symptoms. - Continue heart failure management. - Monitor for fluid retention.  Chronic kidney disease Well-managed. Current medications include torsemide  and metaxalone. - Continue kidney disease management.  Hypothyroidism Managed with levothyroxine  25 mcg after dosage adjustment from 50 mcg. Nail splitting may be related. - Continue levothyroxine  25 mcg daily.  Insomnia Managed with trazodone  50 mg at bedtime. Effectiveness uncertain, plan to increase dose. - Increase trazodone  to 100 mg at bedtime.  Paronychia, right finger Likely due to frequent finger sticks. No infection signs. Managed with Neosporin. - Continue Neosporin.  Onychoschizia (nail splitting) Possibly related to thyroid  disease or nutritional deficiency. No multivitamin taken. - Consider starting multivitamin. - Advise moisturizing nails. -  Consider nail strengthening products.  - POCT Urinalysis Dipstick - tiZANidine  (ZANAFLEX ) 4 MG tablet; Take 1 tablet (4 mg total) by mouth at bedtime as needed.  Dispense: 30 tablet; Refill: 0 - tirzepatide  (MOUNJARO ) 12.5 MG/0.5ML Pen; Inject 15 mg into the skin once a week. - insulin  glargine, 1 Unit Dial , (TOUJEO  SOLOSTAR) 300 UNIT/ML Solostar Pen; Inject 30 Units into the skin as needed. - torsemide  (DEMADEX ) 20 MG tablet; Take 2 tablets (40 mg total) by mouth daily. - traZODone  (DESYREL ) 100 MG tablet; Take 1 tablet (100 mg total) by mouth at bedtime.  Dispense: 90 tablet; Refill: 1 - Flu vaccine HIGH DOSE PF(Fluzone Trivalent)   Return in about 6 months (around 08/13/2024).   Sharyle Fischer, DO

## 2024-02-17 ENCOUNTER — Other Ambulatory Visit: Payer: Self-pay | Admitting: Internal Medicine

## 2024-02-17 DIAGNOSIS — E039 Hypothyroidism, unspecified: Secondary | ICD-10-CM

## 2024-02-18 NOTE — Telephone Encounter (Signed)
 Requested Prescriptions  Refused Prescriptions Disp Refills   levothyroxine  (SYNTHROID ) 50 MCG tablet [Pharmacy Med Name: levothyroxine  50 mcg tablet] 90 tablet 1    Sig: TAKE ONE TABLET BY MOUTH ONCE DAILY 30-60 MINUTES BEFORE BREAKFAST     Endocrinology:  Hypothyroid Agents Failed - 02/18/2024  3:33 PM      Failed - TSH in normal range and within 360 days    TSH  Date Value Ref Range Status  02/27/2023 0.224 (L) 0.350 - 4.500 uIU/mL Final    Comment:    Performed by a 3rd Generation assay with a functional sensitivity of <=0.01 uIU/mL. Performed at [facility], [address] [place], [place] [address]   08/26/2020 14.400 (H) 0.450 - 4.500 uIU/mL Final         Passed - Valid encounter within last 12 months    Recent Outpatient Visits           4 days ago Lumbar pain   [facility] Bernardo Fend, DO   6 months ago Hypertension, unspecified type   [facility] Bernardo Fend, [place]

## 2024-02-21 ENCOUNTER — Other Ambulatory Visit: Payer: Self-pay | Admitting: Internal Medicine

## 2024-02-21 DIAGNOSIS — I480 Paroxysmal atrial fibrillation: Secondary | ICD-10-CM

## 2024-02-21 DIAGNOSIS — I5022 Chronic systolic (congestive) heart failure: Secondary | ICD-10-CM

## 2024-02-21 NOTE — Telephone Encounter (Signed)
 Requested Prescriptions  Pending Prescriptions Disp Refills   ELIQUIS  5 MG TABS tablet [Pharmacy Med Name: Eliquis  5 mg tablet] 60 tablet 5    Sig: TAKE ONE TABLET BY MOUTH TWICE DAILY     Hematology:  Anticoagulants - apixaban  Failed - 02/21/2024  3:36 PM      Failed - HGB in normal range and within 360 days    Hemoglobin  Date Value Ref Range Status  02/27/2023 12.5 (L) 13.0 - 17.0 g/dL Final  97/95/7977 86.8 13.0 - 17.7 g/dL Final         Failed - HCT in normal range and within 360 days    HCT  Date Value Ref Range Status  02/27/2023 37.3 (L) 39.0 - 52.0 % Final   Hematocrit  Date Value Ref Range Status  07/08/2020 40.3 37.5 - 51.0 % Final         Failed - Cr in normal range and within 360 days    Creatinine, Ser  Date Value Ref Range Status  02/27/2023 2.36 (H) 0.61 - 1.24 mg/dL Final   Creatinine, Urine  Date Value Ref Range Status  08/15/2023 38 20 - 320 mg/dL Final         Passed - PLT in normal range and within 360 days    Platelets  Date Value Ref Range Status  02/27/2023 168 150 - 400 K/uL Final  07/08/2020 144 (L) 150 - 450 x10E3/uL Final         Passed - AST in normal range and within 360 days    AST  Date Value Ref Range Status  02/27/2023 22 15 - 41 U/L Final         Passed - ALT in normal range and within 360 days    ALT  Date Value Ref Range Status  02/27/2023 21 0 - 44 U/L Final         Passed - Valid encounter within last 12 months    Recent Outpatient Visits           1 week ago Lumbar pain   [facility] Bernardo Fend, DO   6 months ago Hypertension, unspecified type   [facility] Bernardo Fend, [place]

## 2024-02-24 ENCOUNTER — Other Ambulatory Visit (INDEPENDENT_AMBULATORY_CARE_PROVIDER_SITE_OTHER): Admitting: Pharmacist

## 2024-02-24 DIAGNOSIS — E118 Type 2 diabetes mellitus with unspecified complications: Secondary | ICD-10-CM

## 2024-02-24 NOTE — Progress Notes (Signed)
 Pharmacy Quality Measure Review  This patient is appearing on a report for being at risk of failing the Glycemic Status Assessment in Diabetes measure this calendar year.    Last documented A1c  5.3% on 11/29/23 at [facility].   No action needed at this time.   Catie [facility], PharmD, [facility] Clinical Pharmacist [phone]

## 2024-03-05 ENCOUNTER — Other Ambulatory Visit: Payer: Self-pay | Admitting: Internal Medicine

## 2024-03-05 DIAGNOSIS — I480 Paroxysmal atrial fibrillation: Secondary | ICD-10-CM

## 2024-03-05 DIAGNOSIS — I5022 Chronic systolic (congestive) heart failure: Secondary | ICD-10-CM

## 2024-03-05 DIAGNOSIS — I2510 Atherosclerotic heart disease of native coronary artery without angina pectoris: Secondary | ICD-10-CM

## 2024-03-06 NOTE — Telephone Encounter (Signed)
 Requested Prescriptions  Pending Prescriptions Disp Refills   amiodarone  (PACERONE ) 200 MG tablet [Pharmacy Med Name: amiodarone  200 mg tablet] 30 tablet 0    Sig: TAKE ONE TABLET BY MOUTH ONCE DAILY     Not Delegated - Cardiovascular: Antiarrhythmic Agents - amiodarone  Failed - 03/06/2024 12:58 PM      Failed - This refill cannot be delegated      Failed - Manual Review: Eye exam recommended every 12 months      Failed - TSH in normal range and within 360 days    TSH  Date Value Ref Range Status  02/27/2023 0.224 (L) 0.350 - 4.500 uIU/mL Final    Comment:    Performed by a 3rd Generation assay with a functional sensitivity of <=0.01 uIU/mL. Performed at [facility], [address] [place], [place] [address]   08/26/2020 14.400 (H) 0.450 - 4.500 uIU/mL Final         Failed - Mg Level in normal range and within 360 days    Magnesium   Date Value Ref Range Status  10/01/2020 2.3 1.7 - 2.4 mg/dL Final    Comment:    Performed at [facility], [address] [place], [place] [address]         Failed - K in normal range and within 180 days    Potassium  Date Value Ref Range Status  02/27/2023 3.9 3.5 - 5.1 mmol/L Final         Failed - AST in normal range and within 180 days    AST  Date Value Ref Range Status  02/27/2023 22 15 - 41 U/L Final         Failed - ALT in normal range and within 180 days    ALT  Date Value Ref Range Status  02/27/2023 21 0 - 44 U/L Final         Failed - Patient had chest x-ray within the last 6 months      Passed - Patient had ECG in the last 180 days      Passed - Patient is not pregnant      Passed - Last BP in normal range    BP Readings from Last 1 Encounters:  02/14/24 124/82         Passed - Last Heart Rate in normal range    Pulse Readings from Last 1 Encounters:  02/14/24 84         Passed - Valid encounter within last 6 months    Recent Outpatient Visits           3 weeks ago Lumbar pain   [facility] Bernardo [facility]end, [place]   6 months ago Hypertension, unspecified type   [facility] Bernardo Fend, [place]              Refused Prescriptions Disp Refills   rosuvastatin  (CRESTOR ) 20 MG tablet [Pharmacy Med Name: rosuvastatin  20 mg tablet] 90 tablet 0    Sig: TAKE ONE TABLET BY MOUTH ONCE DAILY     Cardiovascular:  Antilipid - Statins 2 Failed - 03/06/2024 12:58 PM      Failed - Cr in normal range and within 360 days    Creatinine, Ser  Date Value Ref Range Status  02/27/2023 2.36 (H) 0.61 - 1.24 mg/dL Final   Creatinine, Urine  Date Value Ref Range Status  08/15/2023 38 20 - 320 mg/dL Final  Failed - Lipid Panel in normal range within the last 12 months    Cholesterol  Date Value Ref Range Status  02/27/2023 138 0 - 200 mg/dL Final   LDL Cholesterol  Date Value Ref Range Status  02/27/2023 40 0 - 99 mg/dL Final    Comment:           Total Cholesterol/HDL:CHD Risk Coronary Heart Disease Risk Table                     Men   Women  1/2 Average Risk   3.4   3.3  Average Risk       5.0   4.4  2 X Average Risk   9.6   7.1  3 X Average Risk  23.4   11.0        Use the calculated Patient Ratio above and the CHD Risk Table to determine the patient's CHD Risk.        ATP III CLASSIFICATION (LDL):  <100     mg/dL   Optimal  899-870  mg/dL   Near or Above                    Optimal  130-159  mg/dL   Borderline  839-810  mg/dL   High  >809     mg/dL   Very High Performed at [facility], [address] [place], [place] [address]    HDL  Date Value Ref Range Status  02/27/2023 32 (L) >40 mg/dL Final   Triglycerides  Date Value Ref Range Status  02/27/2023 329 (H) <150 mg/dL Final         Passed - Patient is not pregnant      Passed - Valid encounter within last 12 months    Recent Outpatient Visits           3 weeks ago Lumbar pain   [facility]  Bernardo Fend, DO   6 months ago Hypertension, unspecified type   [facility] Bernardo Fend, [place]

## 2024-03-06 NOTE — Telephone Encounter (Signed)
 Requested medications are due for refill today.  yes  Requested medications are on the active medications list.  yes  Last refill. 02/12/2024 #30 0 rf  Future visit scheduled.   yes  Notes to clinic.  Labs are expired.    Requested Prescriptions  Pending Prescriptions Disp Refills   amiodarone  (PACERONE ) 200 MG tablet [Pharmacy Med Name: amiodarone  200 mg tablet] 30 tablet 0    Sig: TAKE ONE TABLET BY MOUTH ONCE DAILY     Not Delegated - Cardiovascular: Antiarrhythmic Agents - amiodarone  Failed - 03/06/2024 12:58 PM      Failed - This refill cannot be delegated      Failed - Manual Review: Eye exam recommended every 12 months      Failed - TSH in normal range and within 360 days    TSH  Date Value Ref Range Status  02/27/2023 0.224 (L) 0.350 - 4.500 uIU/mL Final    Comment:    Performed by a 3rd Generation assay with a functional sensitivity of <=0.01 uIU/mL. Performed at [facility], [address] [place], [place] [address]   08/26/2020 14.400 (H) 0.450 - 4.500 uIU/mL Final         Failed - Mg Level in normal range and within 360 days    Magnesium   Date Value Ref Range Status  10/01/2020 2.3 1.7 - 2.4 mg/dL Final    Comment:    Performed at [facility], [address] [place], [place] [address]         Failed - K in normal range and within 180 days    Potassium  Date Value Ref Range Status  02/27/2023 3.9 3.5 - 5.1 mmol/L Final         Failed - AST in normal range and within 180 days    AST  Date Value Ref Range Status  02/27/2023 22 15 - 41 U/L Final         Failed - ALT in normal range and within 180 days    ALT  Date Value Ref Range Status  02/27/2023 21 0 - 44 U/L Final         Failed - Patient had chest x-ray within the last 6 months      Passed - Patient had ECG in the last 180 days      Passed - Patient is not pregnant      Passed - Last BP in normal range    BP Readings from Last 1 Encounters:  02/14/24 124/82          Passed - Last Heart Rate in normal range    Pulse Readings from Last 1 Encounters:  02/14/24 84         Passed - Valid encounter within last 6 months    Recent Outpatient Visits           3 weeks ago Lumbar pain   [facility] Bern[facility]end, DO   6 months ago Hypertension, unspecified type   [facility] Bernardo Fend, [place]              Refused Prescriptions Disp Refills   rosuvastatin  (CRESTOR ) 20 MG tablet [Pharmacy Med Name: rosuvastatin  20 mg tablet] 90 tablet 0    Sig: TAKE ONE TABLET BY MOUTH ONCE DAILY     Cardiovascular:  Antilipid - Statins 2 Failed - 03/06/2024 12:58 PM      Failed - Cr in normal range and within  360 days    Creatinine, Ser  Date Value Ref Range Status  02/27/2023 2.36 (H) 0.61 - 1.24 mg/dL Final   Creatinine, Urine  Date Value Ref Range Status  08/15/2023 38 20 - 320 mg/dL Final         Failed - Lipid Panel in normal range within the last 12 months    Cholesterol  Date Value Ref Range Status  02/27/2023 138 0 - 200 mg/dL Final   LDL Cholesterol  Date Value Ref Range Status  02/27/2023 40 0 - 99 mg/dL Final    Comment:           Total Cholesterol/HDL:CHD Risk Coronary Heart Disease Risk Table                     Men   Women  1/2 Average Risk   3.4   3.3  Average Risk       5.0   4.4  2 X Average Risk   9.6   7.1  3 X Average Risk  23.4   11.0        Use the calculated Patient Ratio above and the CHD Risk Table to determine the patient's CHD Risk.        ATP III CLASSIFICATION (LDL):  <100     mg/dL   Optimal  899-870  mg/dL   Near or Above                    Optimal  130-159  mg/dL   Borderline  839-810  mg/dL   High  >809     mg/dL   Very High Performed at [facility], [address] [place], [place] [address]    HDL  Date Value Ref Range Status  02/27/2023 32 (L) >40 mg/dL Final   Triglycerides  Date Value Ref Range Status  02/27/2023 329  (H) <150 mg/dL Final         Passed - Patient is not pregnant      Passed - Valid encounter within last 12 months    Recent Outpatient Visits           3 weeks ago Lumbar pain   [facility] Bernardo Fend, DO   6 months ago Hypertension, unspecified type   [facility] Bernardo Fend, [place]

## 2024-03-08 ENCOUNTER — Other Ambulatory Visit: Payer: Self-pay | Admitting: Internal Medicine

## 2024-03-08 DIAGNOSIS — I5032 Chronic diastolic (congestive) heart failure: Secondary | ICD-10-CM

## 2024-03-10 NOTE — Telephone Encounter (Signed)
 Requested medication (s) are due for refill today: routing for review  Requested medication (s) are on the active medication list: yes  Last refill:  02/14/24  Future visit scheduled: no  Notes to clinic:  historical medication     Requested Prescriptions  Pending Prescriptions Disp Refills   torsemide  (DEMADEX ) 20 MG tablet [Pharmacy Med Name: torsemide  20 mg tablet] 180 tablet 0    Sig: TAKE TWO TABLETS BY MOUTH TWICE DAILY     Cardiovascular:  Diuretics - Loop Failed - 03/10/2024 10:07 AM      Failed - K in normal range and within 180 days    Potassium  Date Value Ref Range Status  02/27/2023 3.9 3.5 - 5.1 mmol/L Final         Failed - Ca in normal range and within 180 days    Calcium   Date Value Ref Range Status  02/27/2023 9.6 8.9 - 10.3 mg/dL Final         Failed - Na in normal range and within 180 days    Sodium  Date Value Ref Range Status  02/27/2023 140 135 - 145 mmol/L Final  01/11/2021 137 134 - 144 mmol/L Final         Failed - Cr in normal range and within 180 days    Creatinine, Ser  Date Value Ref Range Status  02/27/2023 2.36 (H) 0.61 - 1.24 mg/dL Final   Creatinine, Urine  Date Value Ref Range Status  08/15/2023 38 20 - 320 mg/dL Final         Failed - Cl in normal range and within 180 days    Chloride  Date Value Ref Range Status  02/27/2023 98 98 - 111 mmol/L Final         Failed - Mg Level in normal range and within 180 days    Magnesium   Date Value Ref Range Status  10/01/2020 2.3 1.7 - 2.4 mg/dL Final    Comment:    Performed at [facility], [address] [place], [place] [address]         Passed - Last BP in normal range    BP Readings from Last 1 Encounters:  02/14/24 124/82         Passed - Valid encounter within last 6 months    Recent Outpatient Visits           3 weeks ago Lumbar pain   [facility] Bernardo Fend, DO   6 months ago Hypertension, unspecified type   [facility] Bernardo [facility]end, DO       Future Appointments             In 2 months End, Lonni, MD [facility] at [facility]

## 2024-03-11 ENCOUNTER — Ambulatory Visit: Admitting: Podiatry

## 2024-03-11 VITALS — Ht 72.0 in | Wt 224.2 lb

## 2024-03-11 DIAGNOSIS — M79674 Pain in right toe(s): Secondary | ICD-10-CM

## 2024-03-11 DIAGNOSIS — B351 Tinea unguium: Secondary | ICD-10-CM | POA: Diagnosis not present

## 2024-03-11 DIAGNOSIS — M79675 Pain in left toe(s): Secondary | ICD-10-CM | POA: Diagnosis not present

## 2024-03-11 NOTE — Progress Notes (Signed)
  Subjective:  Patient ID: [name], male    DOB: 09/08/1950,  MRN: [id number]    73 y.o. male returns for at risk diabetic foot care overall doing well.  Doing well no new issues nails are thickened elongated causing pain and discomfort.  Review of Systems: Negative except as noted in the HPI. Denies N/V/F/Ch.   Objective:  There were no vitals filed for this visit. Body mass index is 30.41 kg/m. Constitutional Well developed. Well nourished.  Vascular Foot warm and well perfused. Capillary refill normal to all digits.   Neurologic Normal speech. Oriented to person, place, and time. Epicritic sensation to light touch grossly reduced bilaterally.  Dermatologic No recurrence of wound or ulceration.  He has thickened elongated toenails x10 bilaterally with subungual brown and yellow discoloration.  Minimal callus today  Orthopedic: Good strength and ROM    Assessment:   1. Pain due to onychomycosis of toenails of both feet        Plan:  Patient was evaluated and treated and all questions answered.   Discussed the etiology and treatment options for the condition in detail with the patient.  Recommended debridement of the nails today. Sharp and mechanical debridement performed of all painful and mycotic nails today. Nails debrided in length and thickness using a nail nipper and burr to level of comfort. Discussed treatment options including appropriate shoe gear. Follow up as needed for painful nails.  Some iatrogenic bleeding on debridement dressed with Neosporin and bandage          Return in about 3 months (around 06/11/2024) for at risk diabetic foot care.

## 2024-03-17 NOTE — Progress Notes (Signed)
 [name]                                          MRN: [id number]   03/17/2024   The [facility] Quality Team Specialist reviewed this patient medical record for the purposes of chart review for care gap closure. The following were reviewed: chart review for care gap closure-colorectal cancer screening.    [facility] Quality Team

## 2024-03-19 ENCOUNTER — Other Ambulatory Visit: Payer: Self-pay | Admitting: Internal Medicine

## 2024-03-19 DIAGNOSIS — E118 Type 2 diabetes mellitus with unspecified complications: Secondary | ICD-10-CM

## 2024-03-19 DIAGNOSIS — I5032 Chronic diastolic (congestive) heart failure: Secondary | ICD-10-CM

## 2024-03-19 DIAGNOSIS — M545 Low back pain, unspecified: Secondary | ICD-10-CM

## 2024-03-20 NOTE — Telephone Encounter (Signed)
 Requested medication (s) are due for refill today: yes  Requested medication (s) are on the active medication list: yes  Last refill:  02/14/24  Future visit scheduled: yes  Notes to clinic:  Unable to refill per protocol, cannot delegate.      Requested Prescriptions  Pending Prescriptions Disp Refills   tiZANidine  (ZANAFLEX ) 4 MG tablet [Pharmacy Med Name: tizanidine  4 mg tablet] 30 tablet 0    Sig: TAKE ONE TABLET BY MOUTH AT BEDTIME AS NEEDED     Not Delegated - Cardiovascular:  Alpha-2 Agonists - tizanidine  Failed - 03/20/2024  3:23 PM      Failed - This refill cannot be delegated      Passed - Valid encounter within last 6 months    Recent Outpatient Visits           1 month ago Lumbar pain   [name], [name]   7 months ago Hypertension, unspecified type   [name], [name]       Future Appointments             In 1 month [name], Love, [name] [name]             torsemide  (DEMADEX ) 20 MG tablet [Pharmacy Med Name: torsemide  20 mg tablet] 180 tablet 0    Sig: TAKE TWO TABLETS BY MOUTH TWICE DAILY     Cardiovascular:  Diuretics - Loop Failed - 03/20/2024  3:23 PM      Failed - K in normal range and within 180 days    Potassium  Date Value Ref Range Status  02/27/2023 3.9 3.5 - 5.1 mmol/L Final         Failed - Ca in normal range and within 180 days    Calcium   Date Value Ref Range Status  02/27/2023 9.6 8.9 - 10.3 mg/dL Final         Failed - Na in normal range and within 180 days    Sodium  Date Value Ref Range Status  02/27/2023 140 135 - 145 mmol/L Final  01/11/2021 137 134 - 144 mmol/L Final         Failed - Cr in normal range and within 180 days    Creatinine, Ser  Date Value Ref Range Status  02/27/2023 2.36 (H) 0.61 - 1.24 mg/dL Final   Creatinine, Urine  Date Value Ref Range Status  08/15/2023 38 20 - 320 mg/dL Final          Failed - Cl in normal range and within 180 days    Chloride  Date Value Ref Range Status  02/27/2023 98 98 - 111 mmol/L Final         Failed - Mg Level in normal range and within 180 days    Magnesium   Date Value Ref Range Status  10/01/2020 2.3 1.7 - 2.4 mg/dL Final    Comment:    Performed at [facility], [address] [place], [place] [address]         Passed - Last BP in normal range    BP Readings from Last 1 Encounters:  02/14/24 124/82         Passed - Valid encounter within last 6 months    Recent Outpatient Visits           1 month ago Lumbar pain   [name], [name]   7  months ago Hypertension, unspecified type   [name], [name]       Future Appointments             In 1 month [name], Love, [name] [name]             glipiZIDE  (GLUCOTROL ) 10 MG tablet [Pharmacy Med Name: glipizide  10 mg tablet] 180 tablet 0    Sig: TAKE ONE TABLET BY MOUTH TWICE DAILY     Endocrinology:  Diabetes - Sulfonylureas Failed - 03/20/2024  3:23 PM      Failed - HBA1C is between 0 and 7.9 and within 180 days    Hemoglobin A1C  Date Value Ref Range Status  05/31/2023 5.4  Final         Failed - Cr in normal range and within 360 days    Creatinine, Ser  Date Value Ref Range Status  02/27/2023 2.36 (H) 0.61 - 1.24 mg/dL Final   Creatinine, Urine  Date Value Ref Range Status  08/15/2023 38 20 - 320 mg/dL Final         Passed - Valid encounter within last 6 months    Recent Outpatient Visits           1 month ago Lumbar pain   [facility] Bernardo Love, [name]   7 months ago Hypertension, unspecified type   [facility] Bernardo Love, [name]       Future Appointments             In 1 month [name], Love, [name] [facility] at [facility]

## 2024-03-23 ENCOUNTER — Other Ambulatory Visit: Payer: Self-pay | Admitting: Internal Medicine

## 2024-03-23 DIAGNOSIS — I5022 Chronic systolic (congestive) heart failure: Secondary | ICD-10-CM

## 2024-03-23 DIAGNOSIS — E118 Type 2 diabetes mellitus with unspecified complications: Secondary | ICD-10-CM

## 2024-03-24 NOTE — Telephone Encounter (Signed)
 Requested medications are due for refill today.  yes  Requested medications are on the active medications list.  yes  Last refill. 02/14/2023 #90 1 rf  Future visit scheduled.   yes  Notes to clinic.  Labs are expired.    Requested Prescriptions  Pending Prescriptions Disp Refills   JARDIANCE  10 MG TABS tablet [Pharmacy Med Name: Jardiance  10 mg tablet] 90 tablet 1    Sig: TAKE ONE TABLET BY MOUTH EVERY EVENING     Endocrinology:  Diabetes - SGLT2 Inhibitors Failed - 03/24/2024  3:43 PM      Failed - Cr in normal range and within 360 days    Creatinine, Ser  Date Value Ref Range Status  02/27/2023 2.36 (H) 0.61 - 1.24 mg/dL Final   Creatinine, Urine  Date Value Ref Range Status  08/15/2023 38 20 - 320 mg/dL Final         Failed - HBA1C is between 0 and 7.9 and within 180 days    Hemoglobin A1C  Date Value Ref Range Status  05/31/2023 5.4  Final         Failed - eGFR in normal range and within 360 days    GFR calc Af Amer  Date Value Ref Range Status  07/08/2020 36 (L) >59 mL/min/1.73 Final    Comment:    **In accordance with recommendations from the NKF-ASN Task force,**   [facility] is in the process of updating its eGFR calculation to the   2021 CKD-EPI creatinine equation that estimates kidney function   without a race variable.    GFR, Estimated  Date Value Ref Range Status  02/27/2023 29 (L) >60 mL/min Final    Comment:    (NOTE) Calculated using the CKD-EPI Creatinine Equation (2021)    eGFR  Date Value Ref Range Status  01/11/2021 28 (L) >59 mL/min/1.73 Final         Passed - Valid encounter within last 6 months    Recent Outpatient Visits           1 month ago Lumbar pain   [facility] Bern[facility]end, DO   7 months ago Hypertension, unspecified type   [facility] Bernardo Fend, DO       Future Appointments             In 1 month End, Lonni, MD [facility] at  [facility]

## 2024-04-01 ENCOUNTER — Other Ambulatory Visit: Payer: Self-pay | Admitting: Internal Medicine

## 2024-04-02 NOTE — Telephone Encounter (Signed)
 Requested Prescriptions  Pending Prescriptions Disp Refills   potassium chloride  (KLOR-CON ) 10 MEQ tablet [Pharmacy Med Name: potassium chloride  ER 10 mEq tablet,extended release] 540 tablet 0    Sig: TAKE SIX TABLETS BY MOUTH THREE TIMES DAILY     Endocrinology:  Minerals - Potassium Supplementation Failed - 04/02/2024  3:58 PM      Failed - K in normal range and within 360 days    Potassium  Date Value Ref Range Status  02/27/2023 3.9 3.5 - 5.1 mmol/L Final         Failed - Cr in normal range and within 360 days    Creatinine, Ser  Date Value Ref Range Status  02/27/2023 2.36 (H) 0.61 - 1.24 mg/dL Final   Creatinine, Urine  Date Value Ref Range Status  08/15/2023 38 20 - 320 mg/dL Final         Passed - Valid encounter within last 12 months    Recent Outpatient Visits           1 month ago Lumbar pain   [facility] Bernardo Fend, DO   7 months ago Hypertension, unspecified type   [facility] Bernardo Fend, DO       Future Appointments             In 1 month End, Lonni, MD [facility] at [facility]

## 2024-04-03 NOTE — Progress Notes (Signed)
 Pharmacy Quality Measure Review  This patient is appearing on a report for being at risk of failing the adherence measure for hypertension (ACEi/ARB) medications this calendar year.   Medication: losartan  Last fill date: 03/19/24 for 30 day supply  Insurance report was not up to date. No action needed at this time.   Ronnald Shedden E. Marsh, PharmD, CPP Clinical Pharmacist [facility] [phone]

## 2024-04-06 ENCOUNTER — Other Ambulatory Visit: Payer: Self-pay | Admitting: Medical

## 2024-04-06 ENCOUNTER — Other Ambulatory Visit: Payer: Self-pay | Admitting: Internal Medicine

## 2024-04-06 DIAGNOSIS — E039 Hypothyroidism, unspecified: Secondary | ICD-10-CM

## 2024-04-06 DIAGNOSIS — I5022 Chronic systolic (congestive) heart failure: Secondary | ICD-10-CM

## 2024-04-06 DIAGNOSIS — I10 Essential (primary) hypertension: Secondary | ICD-10-CM

## 2024-04-06 DIAGNOSIS — I480 Paroxysmal atrial fibrillation: Secondary | ICD-10-CM

## 2024-04-07 NOTE — Telephone Encounter (Signed)
 Requested medications are due for refill today.  yes  Requested medications are on the active medications list.  yes  Last refill. 10/11/2023 #90 1 rf  Future visit scheduled.   A wellness visit upcoming and OV next year  Notes to clinic.  Labs are abnormal and expired.    Requested Prescriptions  Pending Prescriptions Disp Refills   levothyroxine  (SYNTHROID ) 25 MCG tablet [Pharmacy Med Name: levothyroxine  25 mcg tablet] 90 tablet 1    Sig: TAKE ONE TABLET BY MOUTH ONCE DAILY     Endocrinology:  Hypothyroid Agents Failed - 04/07/2024  4:55 PM      Failed - TSH in normal range and within 360 days    TSH  Date Value Ref Range Status  02/27/2023 0.224 (L) 0.350 - 4.500 uIU/mL Final    Comment:    Performed by a 3rd Generation assay with a functional sensitivity of <=0.01 uIU/mL. Performed at [facility], [address] [place], [place] [address]   08/26/2020 14.400 (H) 0.450 - 4.500 uIU/mL Final         Passed - Valid encounter within last 12 months    Recent Outpatient Visits           1 month ago Lumbar pain   [facility] Bernardo Fend, DO   7 months ago Hypertension, unspecified type   [facility] Bernardo Fend, DO       Future Appointments             In 1 month End, Lonni, MD [facility] at [facility]

## 2024-04-08 NOTE — Telephone Encounter (Signed)
 Pt had TSH checked at endo

## 2024-04-10 ENCOUNTER — Ambulatory Visit (INDEPENDENT_AMBULATORY_CARE_PROVIDER_SITE_OTHER)

## 2024-04-10 VITALS — BP 124/82 | Ht 72.0 in | Wt 224.0 lb

## 2024-04-10 DIAGNOSIS — Z0000 Encounter for general adult medical examination without abnormal findings: Secondary | ICD-10-CM | POA: Diagnosis not present

## 2024-04-10 NOTE — Progress Notes (Signed)
 I connected with  [name] on 04/10/24 by a audio enabled telemedicine application and verified that I am speaking with the correct person using two identifiers.  Patient Location: Home  Provider Location: Home Office  Persons Participating in Visit: Patient.  I discussed the limitations of evaluation and management by telemedicine. The patient expressed understanding and agreed to proceed.  Vital Signs: Because this visit was a virtual/telehealth visit, some criteria may be missing or patient reported. Any vitals not documented were not able to be obtained and vitals that have been documented are patient reported.   Because this visit was a virtual/telehealth visit,  certain criteria was not obtained, such a blood pressure, CBG if applicable, and timed get up and go. Any medications not marked as taking were not mentioned during the medication reconciliation part of the visit. Any vitals not documented were not able to be obtained due to this being a telehealth visit or patient was unable to self-report a recent blood pressure reading due to a lack of equipment at home via telehealth. Vitals that have been documented are verbally provided by the patient.   This visit was performed by a medical professional under my direct supervision. I was immediately available for consultation/collaboration. I have reviewed and agree with the Annual Wellness Visit documentation.  Subjective:   [name] is a 73 y.o. male who presents for a Medicare Annual Wellness Visit.  Allergies (verified) Other   History: Past Medical History:  Diagnosis Date   Allergy [phone]   Anxiety    Mild - Intermiteant   Cataract    early stage   CHF (congestive heart failure) (HCC)    CKD (chronic kidney disease) stage 3, GFR 30-59 ml/min (HCC)    Coronary artery disease    a. 1998 s/p ACS Multi-link BMS to the prox LAD ([place], [place]); b. 07/2015 Cath: LM nl, LAD patent stent, LCX min irregs,  OM1/2 min irregs, OM3 nl, RCA min irregs; c. 07/2020 Cath: LM nl, LAD 50p/m, 70d, D2 70, LCX 40p/59m, OM3 40, RCA 60d->Med Rx.   Depression    Intermiteant   Diabetes mellitus type 2, uncontrolled    a. A1c 11.0 in 07/2015.   Glaucoma    Resolved with laser   HFrEF    a. EF 25% by cath in 2013; b. Echo in 07/2015 showing EF of 15-20%, moderate MR, moderate Pulm HTN, severely dilated IVC; c. 11/2016 Echo: EF 55-60%, no rwma, Gr1 DD, mildly dil LA, nl RV fxn. d. 12/2019 Echo: EF 40-45%, e. 07/2020 Echo: EF 20-25%, sev red RV fxx; f. 02/2021 Echo: EF 40-45%, glob HK/signif sept HK. Nl RV fxn.   History of kidney stones    Hypertension    Hypothyroidism 08/15/2023   Ischemic cardiomyopathy    Kidney stones    Left   Myocardial infarction [facility])    Paroxysmal atrial fibrillation (HCC)    a. Dx 07/2015; b. CHA2DS2VASc = 5-->eliquis . Rhythm control w/ amiodarone .   Pollen allergies 09/21/2015   pt called and stated that he woke up and had some drainage-pt states he did not see the color of the drainage-pt denies running a fever and this only happened once-pt instructed to call Dr Geneva office if he starts running a fever or if the color of drainage becomes yellow/green   Psoriasis    Pulmonary hypertension (HCC)    Sleep apnea    Past Surgical History:  Procedure Laterality Date   CARDIAC CATHETERIZATION N/A 07/25/2015  Procedure: Right/Left Heart Cath and Coronary Angiography;  Surgeon: Deatrice DELENA Cage, MD;  Location: [facility];  Service: Cardiovascular;  Laterality: N/A;   CARDIAC CATHE[place]IZATION     [place]   CARDIAC CATHETERIZATION     [place], [place]   CORONARY ANGIOPLASTY WITH STENT PLACEMENT     CYSTOSCOPY WITH STENT PLACEMENT Left 09/07/2015   Procedure: CYSTOSCOPY WITH STENT PLACEMENT;  Surgeon: Rosina Riis, MD;  Location: [facility];  Service: Urology;  Laterality: Left;   CYSTOSCOPY/URETEROSCOPY/HOLMIUM LASER/STENT PLACEMENT Left 10/25/2015   Procedure:  CYSTOSCOPY/URETEROSCOPY/HOLMIUM LASER/STENT EXCHANGE;  Surgeon: Rosina Riis, MD;  Location: [facility];  Service: Urology;  Laterality: Left;   CYSTOSCOPY/URETEROSCOPY/HOLMIUM LASER/STENT PLACEMENT Left 05/11/2019   Procedure: CYSTOSCOPY/URETEROSCOPY/HOLMIUM LASER/STENT PLACEMENT;  Surgeon: Riis Rosina, MD;  Location: [facility];  Service: Urology;  Laterality: Left;   EXTRACORPOREAL SHOCK WAVE LITHOTRIPSY Left 04/02/2019   Procedure: EXTRACORPOREAL SHOCK WAVE LITHOTRIPSY (ESWL);  Surgeon: Riis Rosina, MD;  Location: [facility];  Service: Urology;  Laterality: Left;   EXTRACORPOREAL SHOCK WAVE LITHOTRIPSY Left 03/05/2019   Procedure: EXTRACORPOREAL SHOCK WAVE LITHOTRIPSY (ESWL);  Surgeon: Twylla Glendia BROCKS, MD;  Location: [facility];  Service: Urology;  Laterality: Left;   EYE SURGERY Bilateral    LASER FOR GLAUCOMA   IRRIGATION AND DEBRIDEMENT FOOT Left 09/30/2020   Procedure: IRRIGATION AND DEBRIDEMENT FOOT;  Surgeon: Silva Juliene SAUNDERS, DPM;  Location: [facility];  Service: Podiatry;  Laterality: Left;   KIDNEY SURGERY     LOWER EXTREMITY ANGIOGRAPHY Left 10/04/2020   Procedure: Lower Extremity Angiography;  Surgeon: Jama Cordella MATSU, MD;  Location: [facility];  Service: Cardiovascular;  Laterality: Left;   NEPHROSTOMY TUBE PLACEMENT ([facility]) Left 11/2019   RIGHT/LEFT HEART CATH AND CORONARY ANGIOGRAPHY N/A 07/12/2020   Procedure: RIGHT/LEFT HEART CATH AND CORONARY ANGIOGRAPHY;  Surgeon: Mady Bruckner, MD;  Location: [facility];  Service: Cardiovascular;  Laterality: N/A;   ROBOT ASSISTED LAPAROSCOPIC NEPHRECTOMY Left 12/10/2019   Procedure: XI ROBOTIC ASSISTED LAPAROSCOPIC NEPHRECTOMY;  Surgeon: Riis Rosina, MD;  Location: [facility];  Service: Urology;  Laterality: Left;   URETEROSCOPY WITH HOLMIUM LASER LITHOTRIPSY Left 09/07/2015   Procedure: URETEROSCOPY WITH HOLMIUM LASER LITHOTRIPSY;  Surgeon: Rosina Riis, MD;  Location: [facility];  Service: Urology;   Laterality: Left;   Family History  Problem Relation Age of Onset   Heart failure Father    Arthritis Father    Heart disease Father    Hypertension Father    Heart attack Brother    Diabetes Brother    Heart disease Brother    Hypertension Brother    Birth defects Sister    Early death Sister    Early death Brother    Heart disease Brother    Hypertension Brother    Kidney cancer Neg Hx    Bladder Cancer Neg Hx    Prostate cancer Neg Hx    Social History   Occupational History   Not on file  Tobacco Use   Smoking status: Never   Smokeless tobacco: Never  Vaping Use   Vaping status: Never Used  Substance and Sexual Activity   Alcohol  use: Not Currently    Comment: less than 1   Drug use: Yes    Types: Cocaine, LSD, Marijuana, Opium     Comment: with the exception of marijuana nothing used in 30 years!   Sexual activity: Not Currently    Birth control/protection: Condom   Tobacco Counseling Counseling given: Not Answered  SDOH Screenings  Food Insecurity: No Food Insecurity (04/10/2024)  Housing: Low Risk  (04/10/2024)  Transportation Needs: No Transportation Needs (04/10/2024)  Utilities: Not At Risk (04/10/2024)  Alcohol  Screen: Low Risk  (02/14/2024)  Depression (PHQ2-9): Low Risk  (04/10/2024)  Financial Resource Strain: Low Risk  (02/14/2024)  Physical Activity: Insufficiently Active (04/10/2024)  Social Connections: Moderately Isolated (04/10/2024)  Stress: No Stress Concern Present (04/10/2024)  Recent Concern: Stress - Stress Concern Present (02/14/2024)  Tobacco Use: Low Risk  (04/10/2024)  Health Literacy: Adequate Health Literacy (04/10/2024)   Depression Screen    04/10/2024   11:56 AM 08/15/2023    1:12 PM 02/14/2023    1:36 PM 09/27/2022    2:09 PM 08/14/2022   12:43 PM 07/16/2022    1:17 PM 03/21/2016   11:18 AM  PHQ 2/9 Scores  PHQ - 2 Score 0 0 3 2 2 2  0  PHQ- 9 Score 0  10  3  2  2        Data saved with a previous flowsheet row definition      Goals Addressed             This Visit's Progress    Patient Stated       Patient would like to win the lottery        Visit info / Clinical Intake: Medicare Wellness Visit Type:: Subsequent Annual Wellness Visit Medicare Wellness Visit Mode:: Telephone If telephone:: video declined If telephone or video:: pt reported vitals Interpreter Needed?: No Pre-visit prep was completed: yes AWV questionnaire completed by patient prior to visit?: no Living arrangements:: (!) lives alone Patient's Overall Health Status Rating: very good Typical amount of pain: none Does pain affect daily life?: no Are you currently prescribed opioids?: no  Dietary Habits and Nutritional Risks How many meals a day?: 3 Eats fruit and vegetables daily?: yes Most meals are obtained by: preparing own meals; eating out In the last 2 weeks, have you had any of the following?: -- (none) Diabetic:: (!) yes Any non-healing wounds?: no How often do you check your BS?: continuous glucose monitor Would you like to be referred to a Nutritionist or for Diabetic Management? : no  Functional Status Activities of Daily Living (to include ambulation/medication): Independent Ambulation: Independent with device- listed below Home Assistive Devices/Equipment: Cane Medication Administration: Independent Home Management: Independent Manage your own finances?: yes Primary transportation is: driving Concerns about vision?: no *vision screening is required for WTM* Concerns about hearing?: no  Fall Screening Falls in the past year?: 1 Number of falls in past year: 0 Was there an injury with Fall?: 0 Fall Risk Category Calculator: 1 Patient Fall Risk Level: Low Fall Risk  Fall Risk Patient at Risk for Falls Due to: History of fall(s); Impaired balance/gait Fall risk Follow up: Falls evaluation completed; Education provided  Home and Transportation Safety: All rugs have non-skid backing?: N/A, no rugs All stairs  or steps have railings?: N/A, no stairs Grab bars in the bathtub or shower?: yes Have non-skid surface in bathtub or shower?: yes Good home lighting?: yes Regular seat belt use?: yes Hospital stays in the last year:: no  Cognitive Assessment Difficulty concentrating, remembering, or making decisions? : no Will 6CIT or Mini Cog be Completed: no 6CIT or Mini Cog Declined: patient alert, oriented, able to answer questions appropriately and recall recent events  Advance Directives (For Healthcare) Does Patient Have a Medical Advance Directive?: No Would patient like information on creating a medical advance directive?: No - Patient  declined  Reviewed/Updated  Reviewed/Updated: All        Objective:    Today's Vitals   04/10/24 1148  BP: 124/82  Weight: 224 lb (101.6 kg)  Height: 6' (1.829 m)   Body mass index is 30.38 kg/m.  Current Medications (verified) Outpatient Encounter Medications as of 04/10/2024  Medication Sig   amiodarone  (PACERONE ) 200 MG tablet TAKE ONE TABLET BY MOUTH ONCE DAILY   B-D ULTRAFINE III SHORT PEN 31G X 8 MM MISC 2 (two) times daily as needed.   Bromelains (BROMELAIN PO) Take 2 capsules by mouth daily.   carvedilol  (COREG ) 3.125 MG tablet TAKE ONE TABLET BY MOUTH TWICE DAILY WITH FOOD   CINNAMON  PO Take 1,200 mg by mouth in the morning and at bedtime. [place] Cinnamon  1200mg    Coenzyme Q10 (COQ10) 400 MG CAPS Take 400 mg by mouth 2 (two) times daily.   docusate sodium  (COLACE) 100 MG capsule 2 (two) times daily. Take 300 mg in the pm & 200 mg in the am.   ELIQUIS  5 MG TABS tablet TAKE ONE TABLET BY MOUTH TWICE DAILY   glipiZIDE  (GLUCOTROL ) 10 MG tablet TAKE ONE TABLET BY MOUTH TWICE DAILY   HAWTHORNE BERRY PO Take 565 mg by mouth at bedtime.   insulin  glargine, 1 Unit Dial , (TOUJEO  SOLOSTAR) 300 UNIT/ML Solostar Pen Inject 30 Units into the skin as needed.   JARDIANCE  10 MG TABS tablet TAKE ONE TABLET BY MOUTH EVERY EVENING   ketoconazole  (NIZORAL ) 2  % shampoo Apply 1 Application topically 2 (two) times a week.   levothyroxine  (SYNTHROID ) 25 MCG tablet TAKE ONE TABLET BY MOUTH ONCE DAILY   losartan  (COZAAR ) 25 MG tablet Take 0.5 tablets (12.5 mg total) by mouth daily.   magnesium  oxide (MAG-OX) 400 (240 Mg) MG tablet TAKE ONE TABLET BY MOUTH ONCE DAILY AS NEEDED   MELATONIN-THEANINE PO Take 2 capsules by mouth at bedtime. Olly Sleep Gummies   metolazone  (ZAROXOLYN ) 2.5 MG tablet TAKE ONE TABLET BY MOUTH ONCE DAILY   mometasone  (ELOCON ) 0.1 % cream Apply 1 Application topically daily. To areas of psoriasis   Multiple Vitamins-Minerals (PRESERVISION AREDS 2 PO) Take 1 tablet by mouth in the morning and at bedtime.   potassium chloride  (KLOR-CON ) 10 MEQ tablet TAKE SIX TABLETS BY MOUTH THREE TIMES DAILY   rosuvastatin  (CRESTOR ) 20 MG tablet TAKE ONE TABLET BY MOUTH ONCE DAILY   tirzepatide  (MOUNJARO ) 12.5 MG/0.5ML Pen Inject 15 mg into the skin once a week.   tiZANidine  (ZANAFLEX ) 4 MG tablet TAKE ONE TABLET BY MOUTH AT BEDTIME AS NEEDED   torsemide  (DEMADEX ) 20 MG tablet TAKE TWO TABLETS BY MOUTH TWICE DAILY   traZODone  (DESYREL ) 100 MG tablet Take 1 tablet (100 mg total) by mouth at bedtime.   valACYclovir (VALTREX) 1000 MG tablet Take 1,000 mg by mouth daily as needed.   vitamin C  (ASCORBIC ACID ) 500 MG tablet Take 500 mg by mouth at bedtime.   zinc gluconate 50 MG tablet Take 50 mg by mouth daily.   No facility-administered encounter medications on file as of 04/10/2024.   Hearing/Vision screen Hearing Screening - Comments:: No difficulties  Vision Screening - Comments:: Wears glasses  Immunizations and Health Maintenance Health Maintenance  Topic Date Due   Hepatitis C Screening  Never done   Zoster Vaccines- Shingrix (1 of 2) Never done   Colonoscopy  Never done   FOOT EXAM  10/17/2022   Medicare Annual Wellness (AWV)  09/27/2023   HEMOGLOBIN A1C  11/29/2023  OPHTHALMOLOGY EXAM  01/18/2024   COVID-19 Vaccine (7 - 2025-26  season) 02/03/2024   Diabetic kidney evaluation - eGFR measurement  02/27/2024   Diabetic kidney evaluation - Urine ACR  08/14/2024   Pneumococcal Vaccine: 50+ Years  Completed   Influenza Vaccine  Completed   Meningococcal B Vaccine  Aged Out   DTaP/Tdap/Td  Discontinued        Assessment/Plan:  This is a routine wellness examination for Chucky.  Patient Care Team: Bernardo Fend, DO as PCP - General (Internal Medicine) End, Lonni, MD as PCP - Cardiology (Cardiology) Donette Ellouise LABOR, FNP as Nurse Practitioner (Family Medicine) Penne Knee, MD (Inactive) as Consulting Physician (Urology) Leavy Aloysius LABOR, MD (Nephrology) Lesia Latisha Hauser, MD as Referring Physician (Ophthalmology)  I have personally reviewed and noted the following in the patient's chart:   Medical and social history Use of alcohol , tobacco or illicit drugs  Current medications and supplements including opioid prescriptions. Functional ability and status Nutritional status Physical activity Advanced directives List of other physicians Hospitalizations, surgeries, and ER visits in previous 12 months Vitals Screenings to include cognitive, depression, and falls Referrals and appointments  No orders of the defined types were placed in this encounter.  In addition, I have reviewed and discussed with patient certain preventive protocols, quality metrics, and best practice recommendations. A written personalized care plan for preventive services as well as general preventive health recommendations were provided to patient.   Lyle MARLA Right, [place]   04/10/2024   No follow-ups on file.  After Visit Summary: (MyChart) Due to this being a telephonic visit, the after visit summary with patients personalized plan was offered to patient via MyChart   Nurse Notes:Nothing to report

## 2024-04-10 NOTE — Patient Instructions (Signed)
 [name],  Thank you for taking the time for your Medicare Wellness Visit. I appreciate your continued commitment to your health goals. Please review the care plan we discussed, and feel free to reach out if I can assist you further.  Please note that Annual Wellness Visits do not include a physical exam. Some assessments may be limited, especially if the visit was conducted virtually. If needed, we may recommend an in-person follow-up with your provider.  Ongoing Care Seeing your primary care provider every 3 to 6 months helps us  monitor your health and provide consistent, personalized care.   Referrals If a referral was made during today's visit and you haven't received any updates within two weeks, please contact the referred provider directly to check on the status.  Recommended Screenings:  Health Maintenance  Topic Date Due   Hepatitis C Screening  Never done   Zoster (Shingles) Vaccine (1 of 2) Never done   Colon Cancer Screening  Never done   Complete foot exam   10/17/2022   Medicare Annual Wellness Visit  09/27/2023   Hemoglobin A1C  11/29/2023   Eye exam for diabetics  01/18/2024   COVID-19 Vaccine (7 - 2025-26 season) 02/03/2024   Yearly kidney function blood test for diabetes  02/27/2024   Yearly kidney health urinalysis for diabetes  08/14/2024   Pneumococcal Vaccine for age over 66  Completed   Flu Shot  Completed   Meningitis B Vaccine  Aged Out   DTaP/Tdap/Td vaccine  Discontinued       04/10/2024   11:51 AM  Advanced Directives  Does Patient Have a Medical Advance Directive? No  Would patient like information on creating a medical advance directive? No - Patient declined    Vision: Annual vision screenings are recommended for early detection of glaucoma, cataracts, and diabetic retinopathy. These exams can also reveal signs of chronic conditions such as diabetes and high blood pressure.  Dental: Annual dental screenings help detect early signs of oral  cancer, gum disease, and other conditions linked to overall health, including heart disease and diabetes.  Please see the attached documents for additional preventive care recommendations.

## 2024-05-07 ENCOUNTER — Other Ambulatory Visit: Payer: Self-pay | Admitting: Internal Medicine

## 2024-05-07 DIAGNOSIS — I2510 Atherosclerotic heart disease of native coronary artery without angina pectoris: Secondary | ICD-10-CM

## 2024-05-09 NOTE — Telephone Encounter (Signed)
 Requested medications are due for refill today.  yes  Requested medications are on the active medications list.  yes  Last refill. 01/21/2024 #90 0 rf  Future visit scheduled.   yes  Notes to clinic.  Expired labs    Requested Prescriptions  Pending Prescriptions Disp Refills   rosuvastatin  (CRESTOR ) 20 MG tablet [Pharmacy Med Name: rosuvastatin  20 mg tablet] 90 tablet 0    Sig: TAKE ONE TABLET BY MOUTH ONCE DAILY     Cardiovascular:  Antilipid - Statins 2 Failed - 05/09/2024  9:00 PM      Failed - Cr in normal range and within 360 days    Creatinine, Ser  Date Value Ref Range Status  02/27/2023 2.36 (H) 0.61 - 1.24 mg/dL Final   Creatinine, Urine  Date Value Ref Range Status  08/15/2023 38 20 - 320 mg/dL Final         Failed - Lipid Panel in normal range within the last 12 months    Cholesterol  Date Value Ref Range Status  02/27/2023 138 0 - 200 mg/dL Final   LDL Cholesterol  Date Value Ref Range Status  02/27/2023 40 0 - 99 mg/dL Final    Comment:           Total Cholesterol/HDL:CHD Risk Coronary Heart Disease Risk Table                     Men   Women  1/2 Average Risk   3.4   3.3  Average Risk       5.0   4.4  2 X Average Risk   9.6   7.1  3 X Average Risk  23.4   11.0        Use the calculated Patient Ratio above and the CHD Risk Table to determine the patient's CHD Risk.        ATP III CLASSIFICATION (LDL):  <100     mg/dL   Optimal  899-870  mg/dL   Near or Above                    Optimal  130-159  mg/dL   Borderline  839-810  mg/dL   High  >809     mg/dL   Very High Performed at [facility], [address] [place], [place] [address]    HDL  Date Value Ref Range Status  02/27/2023 32 (L) >40 mg/dL Final   Triglycerides  Date Value Ref Range Status  02/27/2023 329 (H) <150 mg/dL Final         Passed - Patient is not pregnant      Passed - Valid encounter within last 12 months    Recent Outpatient Visits           2 months  ago Lumbar pain   [facility] Bernardo Fend, DO   8 months ago Hypertension, unspecified type   [facility] Bernardo Fend, DO       Future Appointments             In 4 days End, Lonni, MD [facility]

## 2024-05-13 ENCOUNTER — Ambulatory Visit: Attending: Internal Medicine | Admitting: Internal Medicine

## 2024-05-13 VITALS — BP 140/66 | HR 66 | Ht 72.0 in | Wt 214.4 lb

## 2024-05-13 DIAGNOSIS — I2510 Atherosclerotic heart disease of native coronary artery without angina pectoris: Secondary | ICD-10-CM | POA: Diagnosis not present

## 2024-05-13 DIAGNOSIS — I5022 Chronic systolic (congestive) heart failure: Secondary | ICD-10-CM | POA: Diagnosis not present

## 2024-05-13 DIAGNOSIS — Z79899 Other long term (current) drug therapy: Secondary | ICD-10-CM | POA: Diagnosis not present

## 2024-05-13 DIAGNOSIS — I480 Paroxysmal atrial fibrillation: Secondary | ICD-10-CM | POA: Diagnosis not present

## 2024-05-13 DIAGNOSIS — I10 Essential (primary) hypertension: Secondary | ICD-10-CM | POA: Diagnosis not present

## 2024-05-13 DIAGNOSIS — I255 Ischemic cardiomyopathy: Secondary | ICD-10-CM

## 2024-05-13 DIAGNOSIS — I453 Trifascicular block: Secondary | ICD-10-CM

## 2024-05-13 NOTE — Patient Instructions (Signed)
 Medication Instructions:   Your physician recommends that you continue on your current medications as directed. Please refer to the Current Medication list given to you today.    *If you need a refill on your cardiac medications before your next appointment, please call your pharmacy*  Lab Work: Your provider would like for you to have following labs drawn today CBC and CMP.   If you have labs (blood work) drawn today and your tests are completely normal, you will receive your results only by: MyChart Message (if you have MyChart) OR A paper copy in the mail If you have any lab test that is abnormal or we need to change your treatment, we will call you to review the results.  Testing/Procedures: No test ordered today   Follow-Up: At [facility], you and your health needs are our priority.  As part of our continuing mission to provide you with exceptional heart care, our providers are all part of one team.  This team includes your primary Cardiologist (physician) and Advanced Practice Providers or APPs (Physician Assistants and Nurse Practitioners) who all work together to provide you with the care you need, when you need it.  Your next appointment:   6 month(s)  Provider:   You may see Lonni Hanson, MD or one of the following Advanced Practice Providers on your designated Care Team:   Lonni Meager, NP Lesley Maffucci, PA-C Bernardino Bring, PA-C Cadence Crayne, PA-C Tylene Lunch, NP Barnie Hila, NP    We recommend signing up for the patient portal called MyChart.  Sign up information is provided on this After Visit Summary.  MyChart is used to connect with patients for Virtual Visits (Telemedicine).  Patients are able to view lab/test results, encounter notes, upcoming appointments, etc.  Non-urgent messages can be sent to your provider as well.   To learn more about what you can do with MyChart, go to [email or web address].

## 2024-05-13 NOTE — Progress Notes (Unsigned)
 Cardiology Office Note:  .   Date:  05/15/2024  ID:  [name], DOB 1950-07-01, MRN [id number] PCP: Jim Fend, DO  [facility] Providers Cardiologist:  Lonni Hanson, MD     History of Present Illness: .   [name] is a 73 y.o. 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  He was last seen in May by [name], [place], at which time he was feeling okay.  He noted occasional shortness of breath with walking or doing other activities.  Today, [name] reports that he has been doing fairly well.  He notes that he has lost quite a bit of weight and is now only using his metolazone  as needed for edema.  He has not taken it in about 6 weeks.  [name] not weighing himself regularly at home.  He has had a few episodes of right sided chest pain with an associated knot under the skin.  He was recently on a course of cephalexin  for a finger infection, which also seems to have helped the pain and knot in the right chest.  He has occasional lightheadedness but no syncope.  He denies shortness of breath and palpitations.  He remains on 90 mEq of supplemental potassium a day given history of chronic hypokalemia.  ROS: See HPI  Studies Reviewed: SABRA   EKG Interpretation Date/Time:  Wednesday May 13 2024 15:41:01 EST Ventricular Rate:  66 PR Interval:  304 QRS Duration:  164 QT Interval:  486 QTC Calculation: 509 R Axis:   269  Text Interpretation: Sinus rhythm with 1st degree A-V block Right bundle branch block Abnormal ECG When compared with ECG of 11-Oct-2023 14:56, PR interval has  increased Confirmed by Goble Fudala, Lonni [phone]) on 05/15/2024 10:27:20 AM    Risk Assessment/Calculations:         Physical Exam:   VS:  BP (!) 140/66 (BP Location: Left Arm, Patient Position: Sitting, Cuff Size: Normal)   Pulse 66   Ht 6' (1.829 m)   Wt 214 lb 6.4 oz (97.3 kg)   SpO2 99%   BMI 29.08 kg/m    Wt Readings from Last 3 Encounters:  05/13/24 214 lb 6.4 oz (97.3 kg)  04/10/24 224 lb (101.6 kg)  03/11/24 224 lb 3.2 oz (101.7 kg)    General:  NAD. Neck: No JVD or HJR. Lungs: Clear to auscultation bilaterally without wheezes or crackles. Heart: Regular rate and rhythm without murmurs, rubs, or gallops. Abdomen: Soft, nontender, nondistended. Extremities: No lower extremity edema.  ASSESSMENT AND PLAN: .    Chronic heart failure with mild reduced ejection fraction: [name] appears euvolemic on examination today.  [name] only using his as needed metolazone  sparingly, as his weight loss through lifestyle modifications has helped him control his edema better.  I will check a BMP today to ensure that his potassium and renal function are stable in the setting of continued aggressive potassium supplementation as well as chronic kidney disease.  Though he has not had any symptoms of high-grade AV block, I am concerned by his continued lengthening PR interval and RBBB.  We will hold his carvedilol  going forward and repeat an EKG when he returns in  6 months.  Continue current doses of losartan  and empagliflozin  pending repeat labs.  Coronary artery disease: No angina reported.  Right sided chest pain seems musculoskeletal and may have been due to a superficial skin infection.  Exam today is benign.  No further workup recommended at this time.  Paroxysmal atrial fibrillation: [name] maintaining sinus rhythm.  He has not had any palpitations to suggest PAF.  As above, we will hold carvedilol .  Continue apixaban  5 mg twice daily and amiodarone .  LFTs and TSH normal on last check in  11/2023.  Chronic kidney disease stage IV: Repeat BMP today.  Avoid nephrotoxic agents.  Follow-up with nephrology as previously arranged.  Hypertension: Blood pressure mildly elevated today but typically better.  As above, we will hold carvedilol .  Otherwise, continue current medications.    Dispo: Return to clinic in 6 months.  Signed, Lonni Hanson, MD

## 2024-05-14 ENCOUNTER — Ambulatory Visit: Payer: Self-pay | Admitting: Internal Medicine

## 2024-05-14 LAB — COMPREHENSIVE METABOLIC PANEL WITH GFR
ALT: 21 IU/L (ref 0–44)
AST: 26 IU/L (ref 0–40)
Albumin: 4.2 g/dL (ref 3.8–4.8)
Alkaline Phosphatase: 85 IU/L (ref 47–123)
BUN/Creatinine Ratio: 19 (ref 10–24)
BUN: 45 mg/dL — ABNORMAL HIGH (ref 8–27)
Bilirubin Total: 0.3 mg/dL (ref 0.0–1.2)
CO2: 24 mmol/L (ref 20–29)
Calcium: 9.9 mg/dL (ref 8.6–10.2)
Chloride: 104 mmol/L (ref 96–106)
Creatinine, Ser: 2.34 mg/dL — ABNORMAL HIGH (ref 0.76–1.27)
Globulin, Total: 3.2 g/dL (ref 1.5–4.5)
Glucose: 84 mg/dL (ref 70–99)
Potassium: 5.6 mmol/L — ABNORMAL HIGH (ref 3.5–5.2)
Sodium: 142 mmol/L (ref 134–144)
Total Protein: 7.4 g/dL (ref 6.0–8.5)
eGFR: 29 mL/min/1.73 — ABNORMAL LOW (ref >59)

## 2024-05-14 LAB — CBC
Hematocrit: 31.1 % — ABNORMAL LOW (ref 37.5–51.0)
Hemoglobin: 9.3 g/dL — ABNORMAL LOW (ref 13.0–17.7)
MCH: 26.4 pg — ABNORMAL LOW (ref 26.6–33.0)
MCHC: 29.9 g/dL — ABNORMAL LOW (ref 31.5–35.7)
MCV: 88 fL (ref 79–97)
Platelets: 253 x10E3/uL (ref 150–450)
RBC: 3.52 x10E6/uL — ABNORMAL LOW (ref 4.14–5.80)
RDW: 15.7 % — ABNORMAL HIGH (ref 11.6–15.4)
WBC: 9.3 x10E3/uL (ref 3.4–10.8)

## 2024-05-14 NOTE — Telephone Encounter (Signed)
 Patient was returning call. Please advise ?

## 2024-05-15 ENCOUNTER — Encounter: Payer: Self-pay | Admitting: Internal Medicine

## 2024-05-15 MED ORDER — POTASSIUM CHLORIDE ER 10 MEQ PO TBCR: 40.0000 meq | EXTENDED_RELEASE_TABLET | Freq: Every day | ORAL | 3 refills | Status: AC

## 2024-05-20 ENCOUNTER — Other Ambulatory Visit: Payer: Self-pay | Admitting: Internal Medicine

## 2024-05-20 DIAGNOSIS — I5022 Chronic systolic (congestive) heart failure: Secondary | ICD-10-CM

## 2024-05-22 NOTE — Telephone Encounter (Signed)
 Requested Prescriptions  Pending Prescriptions Disp Refills   metolazone  (ZAROXOLYN ) 2.5 MG tablet [Pharmacy Med Name: metolazone  2.5 mg tablet] 90 tablet 0    Sig: TAKE ONE TABLET BY MOUTH ONCE DAILY     Cardiovascular:  Diuretics - Thiazide - metolazone  Failed - 05/22/2024  2:26 PM      Failed - K in normal range and within 180 days    Potassium  Date Value Ref Range Status  05/13/2024 5.6 (H) 3.5 - 5.2 mmol/L Final         Failed - Cr in normal range and within 180 days    Creatinine, Ser  Date Value Ref Range Status  05/13/2024 2.34 (H) 0.76 - 1.27 mg/dL Final   Creatinine, Urine  Date Value Ref Range Status  08/15/2023 38 20 - 320 mg/dL Final         Failed - Last BP in normal range    BP Readings from Last 1 Encounters:  05/13/24 (!) 140/66         Passed - Na in normal range and within 180 days    Sodium  Date Value Ref Range Status  05/13/2024 142 134 - 144 mmol/L Final         Passed - Valid encounter within last 6 months    Recent Outpatient Visits           3 months ago Lumbar pain   [facility] Bernardo Fend, DO   9 months ago Hypertension, unspecified type   [facility] Bernardo Fend, [place]

## 2024-05-29 ENCOUNTER — Telehealth: Payer: Self-pay

## 2024-05-29 ENCOUNTER — Encounter: Payer: Self-pay | Admitting: Internal Medicine

## 2024-05-29 NOTE — Telephone Encounter (Signed)
 Copied from CRM #[id number]. Topic: Appointments - Scheduling Inquiry for Clinic >> May 29, 2024  2:55 PM Antony S wrote: Reason for CRM: his endocrinologist said he needs an appt to see his pcp within the next week since his cbc numbers keep falling but no appts un til 1/21 [phone]

## 2024-05-29 NOTE — Progress Notes (Addendum)
 HPI:  [name] is a 73 y.o. male who presents for follow up Diabetes, hyperlipidemia.  He was diagnosed in the 20s.  I last saw him in 6/25.  At that time, I decreased Toujeo . In 7/25, he messaged having lows so I cut his Toujeo  again. He continued to cut it and went off all together more than a month ago.  He also stopped his mounjaro  due to hypoglycemia as well.  He had an episode where he got low in the store parking lot.  EMS had to come to revive him.  He asks for a few labs that his cardiologist wants repeated.  [name] currently on glipizide  20 mg (two 10 mg  tabs qam), and Jardiance  10 mg daily. [name] off Toujeo /Mounjaro  as above.  He uses the Dexcom G7 for glucose monitoring.  It was downloaded and reviewed.  The average was 142 with a standard deviation of 64.  His time in range is 73%. [name] below range 5% (1% below 54).   He tries to watch the carbohydrate in his diet, though he admits to eating ginger gummies. He does not exercise.  His weight is down 6 pounds since last visit.  [name] concerned about his diabetes.   ROS:  No chest pain.  No shortness of breath.  Medical History: Past Medical History:  Diagnosis Date   Broken wrist    greenstick left wrist   Chicken pox    Diabetes (CMS/HHS-HCC)    German measles    Glaucoma (increased eye pressure)    Heart attack (CMS/HHS-HCC)    High cholesterol    Hypertension    Kidney stones    Psoriasis    STD (male)     Surgical History: Past Surgical History:  Procedure Laterality Date   TOOTH EXTRACTION  1976   wisdom teeth x4   OTHER SURGERY  1998   stent cardiac   GLAUCOMA EYE SURGERY Bilateral 2007   ANGIOGRAPHY LOWER EXTREMITY Left    CIRCUMCISION     hematoma removal Left    left foot anterior, Dr. Silva.   hemoblastoma Left    LITHOTRIPSY      Social History:  reports that he quit smoking about 44 years ago. His smoking use included cigarettes. He has never used smokeless tobacco. He  reports current alcohol  use. He reports that he does not currently use drugs.  [name] retired.  He has dressed as Santa at times.  Family History: family history includes Alzheimer's disease in his mother; Congenital heart disease in his brother, father, and paternal grandfather; Dementia in his mother; Diabetes in his brother; Heart disease in his father; Hiatal hernia in his mother; Myocardial Infarction (Heart attack) in his brother; No Known Problems in his maternal grandfather, maternal grandmother, and paternal grandmother; Other in his sister.  Medications: Current Outpatient Medications  Medication Sig Dispense Refill   AMIOdarone  (PACERONE ) 200 MG tablet Take 200 mg by mouth once daily.     apixaban  (ELIQUIS ) 5 mg tablet Take 5 mg by mouth every 12 (twelve) hours.     ascorbic acid , vitamin C , (VITAMIN C ) 500 MG tablet Take 500 mg by mouth once daily.     b complex vitamins tablet Take 1 tablet by mouth once daily     baclofen  (LIORESAL ) 10 MG tablet Take 10 mg by mouth 3 (three) times daily     bromelains (BROMELAIN MISC) Use 2 (two) times daily  carvediloL  (COREG ) 3.125 MG tablet Take 1 tablet by mouth 2 (two) times daily with meals     cholecalciferol  (VITAMIN D3) 1000 unit tablet Take 2,000 Units by mouth once daily     cinnamon  bark (CINNAMON  ORAL) Take 1,200 mg by mouth 2 (two) times daily     CLOBETASOL PROPIONATE, BULK, MISC Use 1 Application 2 (two) times daily as needed     clonazePAM  (KLONOPIN ) 1 MG tablet Take 1 mg by mouth once daily     CRANBERRY FRUIT CONCENTRATE (CRAN-MAX 500 MG ORAL) Take 1,000 mg by mouth once daily     docusate (COLACE) 100 MG capsule Take by mouth     glipiZIDE  (GLUCOTROL ) 10 MG tablet Take 10 mg by mouth 2 (two) times daily before meals.     hawthorn 500 mg Cap Take 550 mg by mouth once daily.     insulin  glargine U-300 conc (TOUJEO  MAX U-300 SOLOSTAR) 300 unit/mL (3 mL) InPn Inject 80 Units subcutaneously once daily (TITRATE  TO MAX DAILY DOSE OF 120 UNITS) 12 mL 12   JARDIANCE  10 mg tablet TAKE 1 TABLET BY MOUTH ONCE DAILY. 30 tablet 11   levothyroxine  (SYNTHROID ) 25 MCG tablet Take 25 mcg by mouth once daily     losartan  (COZAAR ) 25 MG tablet Take 12.5 mg by mouth once daily     magnesium  oxide (MAG-OX) 400 mg (241.3 mg magnesium ) tablet Take by mouth     medium chain triglycerides (MCT OIL ORAL) Take 1 tablet by mouth once daily     metOLazone  (ZAROXOLYN ) 2.5 MG tablet Take 2.5 mg by mouth once daily     MOUNJARO  15 mg/0.5 mL pen injector INJECT 0.5MLS (15MG ) SUBCUTANEOUSLY WEEKLY 2 mL 12   MOUNJARO  15 mg/0.5 mL PnIj INJECT 15MG  SUBCUTANEOUSLY ONCE A WEEK 2 mL 5   pen needle, diabetic (BD ULTRA-FINE SHORT PEN NEEDLE) 31 gauge x 5/16 needle USE TWICE DAILY AS NEEDED. 200 each 11   potassium chloride  (KLOR-CON ) 20 MEQ ER tablet Take 20 mEq by mouth 3 (three) times daily     rosuvastatin  (CRESTOR ) 20 MG tablet Take 20 mg by mouth once daily     sodium phosphate -potassium phosphate  (K-PHOS NEUTRAL) 250 mg tablet Take 1 tablet by mouth once daily     TORsemide  (DEMADEX ) 20 MG tablet Take 40 mg by mouth     UBIDECARENONE (COQ-10 ORAL) Take 400 mg by mouth 2 (two) times daily.       valACYclovir  (VALTREX ) 1000 MG tablet Take 1,000 mg by mouth 3 (three) times daily     vit A/vit C/vit E/zinc/copper (PRESERVISION AREDS ORAL) Take by mouth 2 (two) times daily     zinc gluconate 50 mg tablet Take 50 mg by mouth once daily     tirzepatide  (MOUNJARO ) 12.5 mg/0.5 mL PnIj Inject 12.5 mg subcutaneously once a week (Patient not taking: Reported on 10/18/2023) 2 mL 12   No current facility-administered medications for this visit.    Allergies: Allergies  Allergen Reactions   Other Hives    Blue cheese    Physical Exam: Vitals:   05/29/24 1310  BP: 100/62  Pulse: 68  SpO2: 97%  Weight: (!) 106.6 kg (235 lb)  Height: 182.9 cm (6')    Body mass index is 31.87 kg/m.   Gen:  WDWN in NAD   Physical exam otherwise deferred due to coronavirus precautions.   Labs: 04/02/16: A1c = 8.6.  K/Cr/Ca = 5.1/1.43/10.8.  Microalbumin = 0.5. 08/08/16: A1c = 6.4.  K/Cr/Ca = 4.7/1.38/10.0.  Cholesterol = 133/339/40/57 11/06/16: A1c = 6.5.  K/Cr/Ca = 4.8/1.51/10.0.  Cholesterol = 136/342/42/58.  LFTs normal.  TSH = 1.71.  Free T4 = 1.4. 02/27/17: POC A1c = 7.2. 06/28/17:  A1c=8.1 09/27/17:  A1c=8.4 11/28/17:  K/Cr/Ca=4.5/2.659.4.  LFTs nl. 12/12/17:  K/Cr/Ca=4.2/2.55/9.8 12/30/17:  A1c=8.5.  MA=16.  Chol=172/361/42.82. 01/15/18:  K/Cr/Ca=4.3/1.62/9.4. 02/04/18:  A1c=9.4.  K/Cr/Ca = 4.6/1.77/9.7, eGFR=39. PTH=32.  D=16.  SPEP/UPEP with faint band on urine.  Kappa/lambda chains elevated.  LFTs nl. UA=7.4 (4-8). 03/04/2018: K/Cr/Ca = 4.3/1.19/9.4, eGFR = 64.  Urine protein/creatinine ratio = 333 (22-128) 06/17/18:  K/Cr/Ca=3.9/1.67/10.0.  Urine P/C=0.2 (0.022-0.128). 08/04/2018: A1c = 8.8 11/10/2018: A1c = 9.8 12/02/18:  Chol=180/266/47/95.  MA=113.   01/09/19:  A1c=8.8.  K/Cr/Ca=4.1/2.16/10.1, eGFR=31.  Chol=204/352/47/110.  LFTs nl.   04/10/2019: K/Cr/Ca = 4.0/1.58/9.3.  LFTs nl.   06/19/2019: A1c = 6.7 10/23/2019: A1c = 5.8 02/22/2020: A1c = 4.9.  K/Cr/Ca = 3.5/1.66/9.1.  MA = 215.  Chol. = 127/150/46/58.  LFTs nl.  07/14/2020: TSH 12.7 07/22/2020 TSH =10.716 07/29/2020: K/Cr/Ca= 4.2/1.95/8.8 08/26/2020: A1c = 7.0.  K/Cr/Ca = 5.0/1.82/9.1, eGFR = 40.  TSH = 14.4 09/27/2020: A1c = 7.4 10/04/2020: K/Cr/Ca = 3.1/2.4/9.0 01/11/2021: A1c = 10.2, K/Cr/Ca = 3.9/2.45/9.6, Glucose = 496, eGFR = 28 04/24/2021:  A1c = 9.7  08/08/2021:  K/Cr/Ca = 3.4/2.08/9.6.  MA = 16.3.  09/21/2021:  A1c = 8.0.   K/Cr/Ca = 3.4/2.27/10.0, eGFR = 30.  MA = 19.  02/08/2022.  A1c=6.9.  K/Cr/Ca= 2.9/2.26/9.5.  PTH=67.  MA=17. 03/02/2022: K/Cr/Ca= 3.2/2.35/8.8.  MA=4.8.  Chol=107/386/30/0.  LFTs nl.  TSH=2.645  07/27/2022:  A1c = 6.3.  K/Cr/Ca= 3.2/2.4/8.7, eGFR=28. 09/07/2022:  K/Cr/Ca = 3.7/2.13/9.0, eGFR = 33.  LFTs nl.  TSH = 0.555 12/28/2022:   A1c = 5.9 02/27/23:  K/Cr/Ca= 3.9/2.36/9.6, eGFR=29.  Chol=138/329/32/66.  LFTs nl. UDY=9.775. 05/31/23:  A1c=5.4.  TSH=4.725.  09/09/2023: K/Cr/Ca = 3.5/2.2/9.8.  D = 45.3.  11/29/2023: A1c = 5.3. K/Cr/Ca= 4.0/2.3/9.2.  Chol=110/288/37/15.  TSH=5.207.  05/13/2024: K/Cr/Ca = 5.6/2.34/9.9,eGFR = 29  LFTs nl. 05/29/2024: A1c = 5.3.  Assessment/Plan: 1.  Type 2 diabetes.  His A1c today is 5.3.  Based on review of his CGM showing nocturnal hypoglycemia, I will cut his glipizide  to 10mg  daily instead of 20mg  daily. I told him if lows continue to stop the glipizide . I also told him to stay off Toujeo  and Mounjaro .   If he ends up stopping the glipizide  and sugars go up, we will plan to restart Mounjaro .   I encouraged lifestyle modifications.   2.  Hypoglycemia.  He had a severe hypoglycemic event in November where EMS had to be called.  [name] now off insulin  and we are cutting his glipizide .  He still has dangerous lows on the glipizide  so I told him we need to continue his CGM.  2.  Hypertension/chronic kidney disease associated with diabetes.  [name] on multiple blood pressure agents.  [name] being followed at [facility].  His most recent creatinine in 12/25 was 2.34, eGFR was 29. I will recheck today.  He last saw nephrology in 10/25.   3.  Hyperlipidemia associated with diabetes.  His LDL in 6/25 was 15, though triglyceride was elevated on rosuvastatin  20 mg daily.   4.  Cardiovascular disease.  He has CAD and CHF.  [name] on amiodarone .  [name] followed by cardiology.  He was last seen in 12/25.  5.  Neuropathy/foot ulcer.  We discussed foot care previously.  He sees podiatry (Dr. Silva, [facility]) on a regular basis (last in 11/24).  His previously had an ulcer which healed.  6.  Elevated calcium .  His calcium  was elevated in 10/17, but it was normal multiple times since that time.  Most recently it was normal in 12/25.  7. Hypothyroidism. TSH was significantly elevated in 2/22 and 3/22, so  I started him in Levothyroxine  at that time. His amiodarone  may be impacting this.  His most recent TSH was nl in 6/25.  He continues on 25 mcg daily.  I will recheck TSH today and make adjustments as necessary.  8.  Prophylaxis.  I will defer a foot exam as he sees podiatry regularly (recently in 10/25) and he also has not wanted to take his shoes off when I have tried to exam his feet.  He last saw Dr. Lesia at [facility] on 10/18/23 and had no retinopathy.  He has mild cataracts and is a glaucoma suspect so they see him every 6 months (though he cancelled earlier this month due to weather).    9.  He will return to clinic in 6 months.  He does not like to have visit summary printed.   Addendum:  05/29/2024 :  K/Cr/Ca= 5.3/2.5/8.8, eGFR=27.  LFTs nl.  TSH=7.678.  H/H=7.1/24.7.   Results sent via MyChart.  Will increase LT4 to 50 mcg daily.  Called and d/w H/H with him on 12/26.  He called pcp and cardiologist.  Cardiologist rec going to ER.  Hgb was 6.9 so txfused.  Get upper and lower scopes on 06/02/24.  06/10/24:  Pt messaged.  Since in hospital no glipizide  and sugars have been good.  Will monitor without.  Told him fine and could restart if constantly become elevated.  06/30/24:  Pt messaged.  Sugars have been high.  Started back on glipizide  once a day and then twice a day.  Today, changed to 2 q am.  Told him to let me know if that helps.  If not, consider adding back Mounjaro  or Toujeo .  This note is partially prepared by Earla Daria Messier, Scribe, in the presence of and acting as the scribe of Dr. Debby Breaker , MD.    [facility], MD

## 2024-05-30 ENCOUNTER — Other Ambulatory Visit: Payer: Self-pay

## 2024-05-30 ENCOUNTER — Emergency Department

## 2024-05-30 ENCOUNTER — Inpatient Hospital Stay
Admission: EM | Admit: 2024-05-30 | Discharge: 2024-06-02 | DRG: 378 | Disposition: A | Discharge status: Home / Self Care | Attending: Internal Medicine | Admitting: Internal Medicine

## 2024-05-30 DIAGNOSIS — D123 Benign neoplasm of transverse colon: Secondary | ICD-10-CM | POA: Diagnosis present

## 2024-05-30 DIAGNOSIS — N1832 Chronic kidney disease, stage 3b: Secondary | ICD-10-CM | POA: Diagnosis present

## 2024-05-30 DIAGNOSIS — E1122 Type 2 diabetes mellitus with diabetic chronic kidney disease: Secondary | ICD-10-CM | POA: Diagnosis present

## 2024-05-30 DIAGNOSIS — Z8249 Family history of ischemic heart disease and other diseases of the circulatory system: Secondary | ICD-10-CM | POA: Diagnosis not present

## 2024-05-30 DIAGNOSIS — D120 Benign neoplasm of cecum: Secondary | ICD-10-CM | POA: Diagnosis present

## 2024-05-30 DIAGNOSIS — Z79899 Other long term (current) drug therapy: Secondary | ICD-10-CM

## 2024-05-30 DIAGNOSIS — Z6831 Body mass index (BMI) 31.0-31.9, adult: Secondary | ICD-10-CM

## 2024-05-30 DIAGNOSIS — K2289 Other specified disease of esophagus: Secondary | ICD-10-CM | POA: Diagnosis present

## 2024-05-30 DIAGNOSIS — E039 Hypothyroidism, unspecified: Secondary | ICD-10-CM | POA: Diagnosis present

## 2024-05-30 DIAGNOSIS — D649 Anemia, unspecified: Principal | ICD-10-CM

## 2024-05-30 DIAGNOSIS — I5022 Chronic systolic (congestive) heart failure: Secondary | ICD-10-CM | POA: Diagnosis present

## 2024-05-30 DIAGNOSIS — I252 Old myocardial infarction: Secondary | ICD-10-CM

## 2024-05-30 DIAGNOSIS — L409 Psoriasis, unspecified: Secondary | ICD-10-CM | POA: Diagnosis present

## 2024-05-30 DIAGNOSIS — E1169 Type 2 diabetes mellitus with other specified complication: Secondary | ICD-10-CM

## 2024-05-30 DIAGNOSIS — Z79890 Hormone replacement therapy (postmenopausal): Secondary | ICD-10-CM

## 2024-05-30 DIAGNOSIS — K621 Rectal polyp: Secondary | ICD-10-CM | POA: Diagnosis present

## 2024-05-30 DIAGNOSIS — Z905 Acquired absence of kidney: Secondary | ICD-10-CM

## 2024-05-30 DIAGNOSIS — I2510 Atherosclerotic heart disease of native coronary artery without angina pectoris: Secondary | ICD-10-CM | POA: Diagnosis present

## 2024-05-30 DIAGNOSIS — Z7985 Long-term (current) use of injectable non-insulin antidiabetic drugs: Secondary | ICD-10-CM

## 2024-05-30 DIAGNOSIS — Z1152 Encounter for screening for COVID-19: Secondary | ICD-10-CM

## 2024-05-30 DIAGNOSIS — D122 Benign neoplasm of ascending colon: Secondary | ICD-10-CM | POA: Diagnosis present

## 2024-05-30 DIAGNOSIS — D509 Iron deficiency anemia, unspecified: Secondary | ICD-10-CM | POA: Diagnosis present

## 2024-05-30 DIAGNOSIS — K921 Melena: Principal | ICD-10-CM | POA: Diagnosis present

## 2024-05-30 DIAGNOSIS — I255 Ischemic cardiomyopathy: Secondary | ICD-10-CM | POA: Diagnosis present

## 2024-05-30 DIAGNOSIS — K5730 Diverticulosis of large intestine without perforation or abscess without bleeding: Secondary | ICD-10-CM | POA: Diagnosis present

## 2024-05-30 DIAGNOSIS — E785 Hyperlipidemia, unspecified: Secondary | ICD-10-CM | POA: Diagnosis present

## 2024-05-30 DIAGNOSIS — K922 Gastrointestinal hemorrhage, unspecified: Secondary | ICD-10-CM | POA: Diagnosis not present

## 2024-05-30 DIAGNOSIS — Z91018 Allergy to other foods: Secondary | ICD-10-CM

## 2024-05-30 DIAGNOSIS — Z8261 Family history of arthritis: Secondary | ICD-10-CM

## 2024-05-30 DIAGNOSIS — I480 Paroxysmal atrial fibrillation: Secondary | ICD-10-CM | POA: Diagnosis present

## 2024-05-30 DIAGNOSIS — Z833 Family history of diabetes mellitus: Secondary | ICD-10-CM

## 2024-05-30 DIAGNOSIS — I272 Pulmonary hypertension, unspecified: Secondary | ICD-10-CM | POA: Diagnosis present

## 2024-05-30 DIAGNOSIS — Z794 Long term (current) use of insulin: Secondary | ICD-10-CM

## 2024-05-30 DIAGNOSIS — Z7984 Long term (current) use of oral hypoglycemic drugs: Secondary | ICD-10-CM | POA: Diagnosis not present

## 2024-05-30 DIAGNOSIS — D62 Acute posthemorrhagic anemia: Secondary | ICD-10-CM | POA: Diagnosis present

## 2024-05-30 DIAGNOSIS — D128 Benign neoplasm of rectum: Secondary | ICD-10-CM | POA: Diagnosis present

## 2024-05-30 DIAGNOSIS — Z7901 Long term (current) use of anticoagulants: Secondary | ICD-10-CM

## 2024-05-30 DIAGNOSIS — E669 Obesity, unspecified: Secondary | ICD-10-CM | POA: Diagnosis present

## 2024-05-30 DIAGNOSIS — Z955 Presence of coronary angioplasty implant and graft: Secondary | ICD-10-CM

## 2024-05-30 DIAGNOSIS — I130 Hypertensive heart and chronic kidney disease with heart failure and stage 1 through stage 4 chronic kidney disease, or unspecified chronic kidney disease: Secondary | ICD-10-CM | POA: Diagnosis present

## 2024-05-30 DIAGNOSIS — R7401 Elevation of levels of liver transaminase levels: Secondary | ICD-10-CM | POA: Diagnosis present

## 2024-05-30 HISTORY — DX: Iron deficiency anemia, unspecified: D50.9

## 2024-05-30 LAB — IRON AND TIBC
Iron: 20 ug/dL — ABNORMAL LOW (ref 45–182)
Saturation Ratios: 5 % — ABNORMAL LOW (ref 17.9–39.5)
TIBC: 389 ug/dL (ref 250–450)
UIBC: 369 ug/dL

## 2024-05-30 LAB — COMPREHENSIVE METABOLIC PANEL WITH GFR
ALT: 49 U/L — ABNORMAL HIGH (ref 0–44)
AST: 47 U/L — ABNORMAL HIGH (ref 15–41)
Albumin: 3.7 g/dL (ref 3.5–5.0)
Alkaline Phosphatase: 90 U/L (ref 38–126)
Anion gap: 14 (ref 5–15)
BUN: 40 mg/dL — ABNORMAL HIGH (ref 8–23)
CO2: 23 mmol/L (ref 22–32)
Calcium: 8.8 mg/dL — ABNORMAL LOW (ref 8.9–10.3)
Chloride: 98 mmol/L (ref 98–111)
Creatinine, Ser: 2.17 mg/dL — ABNORMAL HIGH (ref 0.61–1.24)
GFR, Estimated: 32 mL/min — ABNORMAL LOW
Glucose, Bld: 88 mg/dL (ref 70–99)
Potassium: 3.8 mmol/L (ref 3.5–5.1)
Sodium: 135 mmol/L (ref 135–145)
Total Bilirubin: 0.3 mg/dL (ref 0.0–1.2)
Total Protein: 7 g/dL (ref 6.5–8.1)

## 2024-05-30 LAB — CBC
HCT: 22.8 % — ABNORMAL LOW (ref 39.0–52.0)
Hemoglobin: 6.8 g/dL — ABNORMAL LOW (ref 13.0–17.0)
MCH: 26.5 pg (ref 26.0–34.0)
MCHC: 29.8 g/dL — ABNORMAL LOW (ref 30.0–36.0)
MCV: 88.7 fL (ref 80.0–100.0)
Platelets: 232 K/uL (ref 150–400)
RBC: 2.57 MIL/uL — ABNORMAL LOW (ref 4.22–5.81)
RDW: 19.6 % — ABNORMAL HIGH (ref 11.5–15.5)
WBC: 6.5 K/uL (ref 4.0–10.5)
nRBC: 0 % (ref 0.0–0.2)

## 2024-05-30 LAB — RESP PANEL BY RT-PCR (RSV, FLU A&B, COVID)  RVPGX2
Influenza A by PCR: NEGATIVE
Influenza B by PCR: NEGATIVE
Resp Syncytial Virus by PCR: NEGATIVE
SARS Coronavirus 2 by RT PCR: NEGATIVE

## 2024-05-30 LAB — TSH: TSH: 5.77 u[IU]/mL — ABNORMAL HIGH (ref 0.350–4.500)

## 2024-05-30 LAB — TROPONIN T, HIGH SENSITIVITY
Troponin T High Sensitivity: 57 ng/L — ABNORMAL HIGH (ref 0–19)
Troponin T High Sensitivity: 49 ng/L — ABNORMAL HIGH (ref 0–19)

## 2024-05-30 LAB — FERRITIN: Ferritin: 76 ng/mL (ref 24–336)

## 2024-05-30 LAB — MAGNESIUM: Magnesium: 2.2 mg/dL (ref 1.7–2.4)

## 2024-05-30 LAB — PREPARE RBC (CROSSMATCH)

## 2024-05-30 MED ORDER — PANTOPRAZOLE SODIUM 40 MG IV SOLR
40.0000 mg | Freq: Once | INTRAVENOUS | Status: DC
  Filled 2024-05-30: qty 10

## 2024-05-30 MED ORDER — ACETAMINOPHEN 325 MG PO TABS: 650.0000 mg | ORAL_TABLET | Freq: Four times a day (QID) | ORAL | Status: DC | PRN

## 2024-05-30 MED ORDER — ONDANSETRON HCL 4 MG PO TABS: 4.0000 mg | ORAL_TABLET | Freq: Four times a day (QID) | ORAL | Status: DC | PRN

## 2024-05-30 MED ORDER — PANTOPRAZOLE SODIUM 40 MG IV SOLR
40.0000 mg | Freq: Two times a day (BID) | INTRAVENOUS | Status: DC
  Administered 2024-05-30 – 2024-06-02 (×6): 40 mg via INTRAVENOUS
  Filled 2024-05-30 (×3): qty 10

## 2024-05-30 MED ORDER — ONDANSETRON HCL 4 MG/2ML IJ SOLN: 4.0000 mg | Freq: Four times a day (QID) | INTRAMUSCULAR | Status: DC | PRN

## 2024-05-30 MED ORDER — SODIUM CHLORIDE 0.9 % IV SOLN: 10.0000 mL/h | Freq: Once | INTRAVENOUS | Status: AC

## 2024-05-30 MED ORDER — ACETAMINOPHEN 650 MG RE SUPP: 650.0000 mg | Freq: Four times a day (QID) | RECTAL | Status: DC | PRN

## 2024-05-30 MED ORDER — ROSUVASTATIN CALCIUM 10 MG PO TABS
20.0000 mg | ORAL_TABLET | Freq: Every day | ORAL | Status: DC
  Administered 2024-05-30 – 2024-06-02 (×3): 20 mg via ORAL
  Filled 2024-05-30: qty 2
  Filled 2024-05-30: qty 1

## 2024-05-30 MED ORDER — AMIODARONE HCL 200 MG PO TABS
200.0000 mg | ORAL_TABLET | Freq: Every day | ORAL | Status: DC
  Administered 2024-05-30 – 2024-06-02 (×3): 200 mg via ORAL
  Filled 2024-05-30 (×2): qty 1

## 2024-05-30 MED ORDER — INSULIN ASPART 100 UNIT/ML IJ SOLN: 0.0000 [IU] | Freq: Every day | INTRAMUSCULAR | Status: DC

## 2024-05-30 MED ORDER — INSULIN ASPART 100 UNIT/ML IJ SOLN
0.0000 [IU] | Freq: Three times a day (TID) | INTRAMUSCULAR | Status: DC
  Filled 2024-05-30: qty 2

## 2024-05-30 MED ORDER — LEVOTHYROXINE SODIUM 50 MCG PO TABS
25.0000 ug | ORAL_TABLET | Freq: Every day | ORAL | Status: DC
  Administered 2024-05-30 – 2024-06-02 (×4): 25 ug via ORAL
  Filled 2024-05-30 (×2): qty 1

## 2024-05-30 NOTE — ED Notes (Signed)
 Pt c/o SOB that started 4-6 weeks ago. Pt reports seeing cardiologist early in December and states Hematocrit was low and recommended re checking in a few weeks. Pt reports seeing endocrinolgist yesterday and reports hematocrit dropped to 7.1. Pt states cardiologist recommended coming to the ED. Pt denies pain at this time. Pt A&Ox4.

## 2024-05-30 NOTE — ED Notes (Signed)
 Called [facility] to add pt to monitoring.

## 2024-05-30 NOTE — ED Notes (Signed)
 Placed in 2l per Pt request

## 2024-05-30 NOTE — Consult Note (Signed)
 " Consultation  Referring Provider:  Hospitalist    Admit date: 05/30/2024 Consult date: 05/30/2024         Reason for Consultation: IDA              HPI:   [name] is a 73 y.o. y/o gentleman with history of ischemic cardiomyopathy, HFrEF with recovered EF, CKD (GFR 32), DM II, hypothyroidism, and a. Fib on eliquis  (last dose yesterday morning) here with IDA. Has been having increasing shortness of breath over the past few weeks and hemoglobin has dropped from 9 a few weeks ago at his cardiology office to 7.1 yesterday to 6. 8 today. He was called to come to the ED for a blood transfusion. No overt GI bleeding. He endorses weight loss but mostly intentional. No alcohol  use currently. No known family history of GI malignancies. [name] colonoscopy naive. History of partial nephrectomy. He endorses some ice cravings. No NSAIDS.  Past Medical History:  Diagnosis Date   Allergy [phone]   Anxiety    Mild - Intermiteant   Cataract    early stage   CHF (congestive heart failure) (HCC)    CKD (chronic kidney disease) stage 3, GFR 30-59 ml/min (HCC)    Coronary artery disease    a. 1998 s/p ACS Multi-link BMS to the prox LAD ([place], [place]); b. 07/2015 Cath: LM nl, LAD patent stent, LCX min irregs, OM1/2 min irregs, OM3 nl, RCA min irregs; c. 07/2020 Cath: LM nl, LAD 50p/m, 70d, D2 70, LCX 40p/25m, OM3 40, RCA 60d->Med Rx.   Depression    Intermiteant   Diabetes mellitus type 2, uncontrolled    a. A1c 11.0 in 07/2015.   Glaucoma    Resolved with laser   HFrEF    a. EF 25% by cath in 2013; b. Echo in 07/2015 showing EF of 15-20%, moderate MR, moderate Pulm HTN, severely dilated IVC; c. 11/2016 Echo: EF 55-60%, no rwma, Gr1 DD, mildly dil LA, nl RV fxn. d. 12/2019 Echo: EF 40-45%, e. 07/2020 Echo: EF 20-25%, sev red RV fxx; f. 02/2021 Echo: EF 40-45%, glob HK/signif sept HK. Nl RV fxn.   History of kidney stones    Hypertension    Hypothyroidism 08/15/2023   Ischemic cardiomyopathy     Kidney stones    Left   Myocardial infarction [facility])    Paroxysmal atrial fibrillation (HCC)    a. Dx 07/2015; b. CHA2DS2VASc = 5-->eliquis . Rhythm control w/ amiodarone .   Pollen allergies 09/21/2015   pt called and stated that he woke up and had some drainage-pt states he did not see the color of the drainage-pt denies running a fever and this only happened once-pt instructed to call Dr Geneva office if he starts running a fever or if the color of drainage becomes yellow/green   Psoriasis    Pulmonary hypertension (HCC)    Sleep apnea     Past Surgical History:  Procedure Laterality Date   CARDIAC CATHETERIZATION N/A 07/25/2015   Procedure: Right/Left Heart Cath and Coronary Angiography;  Surgeon: Deatrice DELENA Cage, MD;  Location: [facility];  Service: Cardiovascular;  Laterality: N/A;   CARDIAC CATHE[place]     [place]   CARDIAC CATHETERIZATION     [place], [place]   CORONARY ANGIOPLASTY WITH STENT PLACEMENT     CYSTOSCOPY WITH STENT PLACEMENT Left 09/07/2015   Procedure: CYSTOSCOPY WITH STENT PLACEMENT;  Surgeon: Rosina Riis, MD;  Location: [facility];  Service: Urology;  Laterality: Left;  CYSTOSCOPY/URETEROSCOPY/HOLMIUM LASER/STENT PLACEMENT Left 10/25/2015   Procedure: CYSTOSCOPY/URETEROSCOPY/HOLMIUM LASER/STENT EXCHANGE;  Surgeon: Rosina Riis, MD;  Location: [facility];  Service: Urology;  Laterality: Left;   CYSTOSCOPY/URETEROSCOPY/HOLMIUM LASER/STENT PLACEMENT Left 05/11/2019   Procedure: CYSTOSCOPY/URETEROSCOPY/HOLMIUM LASER/STENT PLACEMENT;  Surgeon: Riis Rosina, MD;  Location: [facility];  Service: Urology;  Laterality: Left;   EXTRACORPOREAL SHOCK WAVE LITHOTRIPSY Left 04/02/2019   Procedure: EXTRACORPOREAL SHOCK WAVE LITHOTRIPSY (ESWL);  Surgeon: Riis Rosina, MD;  Location: [facility];  Service: Urology;  Laterality: Left;   EXTRACORPOREAL SHOCK WAVE LITHOTRIPSY Left 03/05/2019   Procedure: EXTRACORPOREAL SHOCK WAVE LITHOTRIPSY (ESWL);  Surgeon:  Twylla Glendia BROCKS, MD;  Location: [facility];  Service: Urology;  Laterality: Left;   EYE SURGERY Bilateral    LASER FOR GLAUCOMA   IRRIGATION AND DEBRIDEMENT FOOT Left 09/30/2020   Procedure: IRRIGATION AND DEBRIDEMENT FOOT;  Surgeon: Silva Juliene SAUNDERS, DPM;  Location: [facility];  Service: Podiatry;  Laterality: Left;   KIDNEY SURGERY     LOWER EXTREMITY ANGIOGRAPHY Left 10/04/2020   Procedure: Lower Extremity Angiography;  Surgeon: Jama Cordella MATSU, MD;  Location: [facility];  Service: Cardiovascular;  Laterality: Left;   NEPHRECTOMY Left    NEPHROSTOMY TUBE PLACEMENT ([facility]) Left 11/2019   RIGHT/LEFT HEART CATH AND CORONARY ANGIOGRAPHY N/A 07/12/2020   Procedure: RIGHT/LEFT HEART CATH AND CORONARY ANGIOGRAPHY;  Surgeon: Mady Bruckner, MD;  Location: [facility];  Service: Cardiovascular;  Laterality: N/A;   ROBOT ASSISTED LAPAROSCOPIC NEPHRECTOMY Left 12/10/2019   Procedure: XI ROBOTIC ASSISTED LAPAROSCOPIC NEPHRECTOMY;  Surgeon: Riis Rosina, MD;  Location: [facility];  Service: Urology;  Laterality: Left;   URETEROSCOPY WITH HOLMIUM LASER LITHOTRIPSY Left 09/07/2015   Procedure: URETEROSCOPY WITH HOLMIUM LASER LITHOTRIPSY;  Surgeon: Rosina Riis, MD;  Location: [facility];  Service: Urology;  Laterality: Left;    Family History  Problem Relation Age of Onset   Heart failure Father    Arthritis Father    Heart disease Father    Hypertension Father    Heart attack Brother    Diabetes Brother    Heart disease Brother    Hypertension Brother    Birth defects Sister    Early death Sister    Early death Brother    Heart disease Brother    Hypertension Brother    Kidney cancer Neg Hx    Bladder Cancer Neg Hx    Prostate cancer Neg Hx     Social History[1]  Prior to Admission medications  Medication Sig Start Date End Date Taking? Authorizing Provider  amiodarone  (PACERONE ) 200 MG tablet TAKE ONE TABLET BY MOUTH ONCE DAILY 03/06/24  Yes Bernardo Fend,  DO  Bromelains (BROMELAIN PO) Take 2 capsules by mouth daily.   Yes [provider]  CINNAMON  PO Take 1,200 mg by mouth in the morning and at bedtime. [place] Cinnamon  1200mg    Yes [provider]  Coenzyme Q10 (COQ10) 400 MG CAPS Take 400 mg by mouth 2 (two) times daily.   Yes [provider]  docusate sodium  (COLACE) 100 MG capsule 300 mg every evening. Take 300 mg in the pm & 200 mg in the am.   Yes [provider]  ELIQUIS  5 MG TABS tablet TAKE ONE TABLET BY MOUTH TWICE DAILY 02/21/24  Yes Bernardo Fend, DO  glipiZIDE  (GLUCOTROL ) 10 MG tablet TAKE ONE TABLET BY MOUTH TWICE DAILY Patient taking differently: Take 20 mg by mouth daily. 03/23/24  Yes Bernardo Fend, DO  HAWTHORNE BERRY PO Take 565 mg by  mouth at bedtime.   Yes [provider]  JARDIANCE  10 MG TABS tablet TAKE ONE TABLET BY MOUTH EVERY EVENING 03/25/24  Yes Pender, Julie F, FNP  levothyroxine  (SYNTHROID ) 25 MCG tablet TAKE ONE TABLET BY MOUTH ONCE DAILY 04/08/24  Yes Bernardo Fend, DO  losartan  (COZAAR ) 25 MG tablet Take 0.5 tablets (12.5 mg total) by mouth daily. 08/15/23  Yes Bernardo Fend, DO  magnesium  oxide (MAG-OX) 400 (240 Mg) MG tablet TAKE ONE TABLET BY MOUTH ONCE DAILY AS NEEDED Patient taking differently: Take 400 mg by mouth daily. 01/31/23  Yes Bernardo Fend, DO  Multiple Vitamins-Minerals (PRESERVISION AREDS 2 PO) Take 1 tablet by mouth in the morning and at bedtime.   Yes [provider]  potassium chloride  (KLOR-CON ) 10 MEQ tablet Take 4 tablets (40 mEq total) by mouth daily. 05/15/24  Yes End, Lonni, MD  rosuvastatin  (CRESTOR ) 20 MG tablet TAKE ONE TABLET BY MOUTH ONCE DAILY 05/11/24  Yes Bernardo Fend, DO  tiZANidine  (ZANAFLEX ) 4 MG tablet TAKE ONE TABLET BY MOUTH AT BEDTIME AS NEEDED 03/23/24  Yes Bernardo Fend, DO  torsemide  (DEMADEX ) 20 MG tablet TAKE TWO TABLETS BY MOUTH TWICE DAILY Patient taking differently: Take 40 mg by  mouth daily. 03/23/24  Yes Bernardo Fend, DO  vitamin C  (ASCORBIC ACID ) 500 MG tablet Take 1,000 mg by mouth 2 (two) times daily.   Yes [provider]  zinc gluconate 50 MG tablet Take 75 mg by mouth daily.   Yes [provider]  B-D ULTRAFINE III SHORT PEN 31G X 8 MM MISC 2 (two) times daily as needed. 01/21/23   [provider]  insulin  glargine, 1 Unit Dial , (TOUJEO  SOLOSTAR) 300 UNIT/ML Solostar Pen Inject 30 Units into the skin as needed. Patient not taking: Reported on 05/30/2024 02/14/24   Bernardo Fend, DO  ketoconazole  (NIZORAL ) 2 % shampoo Apply 1 Application topically 2 (two) times a week. Patient not taking: Reported on 05/30/2024 01/29/22   Hester Alm BROCKS, MD  MELATONIN-THEANINE PO Take 2 capsules by mouth at bedtime. Olly Sleep Gummies Patient not taking: Reported on 05/30/2024    [provider]  metolazone  (ZAROXOLYN ) 2.5 MG tablet TAKE ONE TABLET BY MOUTH ONCE DAILY Patient not taking: Reported on 05/30/2024 05/22/24   Bernardo Fend, DO  mometasone  (ELOCON ) 0.1 % cream Apply 1 Application topically daily. To areas of psoriasis Patient not taking: Reported on 05/30/2024 01/29/22   Hester Alm BROCKS, MD  tirzepatide  (MOUNJARO ) 12.5 MG/0.5ML Pen Inject 15 mg into the skin once a week. Patient not taking: Reported on 05/30/2024 02/14/24   Bernardo Fend, DO  traZODone  (DESYREL ) 100 MG tablet Take 1 tablet (100 mg total) by mouth at bedtime. 02/14/24   Bernardo Fend, DO  valACYclovir (VALTREX) 1000 MG tablet Take 1,000 mg by mouth daily as needed. Patient not taking: Reported on 05/30/2024 08/01/20   [provider]    Current Facility-Administered Medications  Medication Dose Route Frequency Provider Last Rate Last Admin   0.9 %  sodium chloride  infusion  10 mL/hr Intravenous Once Smith, Dylan, MD       acetaminophen  (TYLENOL ) tablet 650 mg  650 mg Oral Q6H PRN Pahwani, Rinka R, MD       Or   acetaminophen   (TYLENOL ) suppository 650 mg  650 mg Rectal Q6H PRN Pahwani, Rinka R, MD       amiodarone  (PACERONE ) tablet 200 mg  200 mg Oral Daily Pahwani, Rinka R, MD   200 mg at 05/30/24 1731  insulin  aspart (novoLOG ) injection 0-15 Units  0-15 Units Subcutaneous TID WC Pahwani, Rinka R, MD       insulin  aspart (novoLOG ) injection 0-5 Units  0-5 Units Subcutaneous QHS Pahwani, Rinka R, MD       levothyroxine  (SYNTHROID ) tablet 25 mcg  25 mcg Oral Daily Pahwani, Rinka R, MD   25 mcg at 05/30/24 1731   ondansetron  (ZOFRAN ) tablet 4 mg  4 mg Oral Q6H PRN Pahwani, Rinka R, MD       Or   ondansetron  (ZOFRAN ) injection 4 mg  4 mg Intravenous Q6H PRN Pahwani, Rinka R, MD       pantoprazole  (PROTONIX ) injection 40 mg  40 mg Intravenous Q12H Pahwani, Rinka R, MD       pantoprazole  (PROTONIX ) injection 40 mg  40 mg Intravenous Once Park, Leonor BROCKS, RPH       rosuvastatin  (CRESTOR ) tablet 20 mg  20 mg Oral Daily Pahwani, Rinka R, MD   20 mg at 05/30/24 1731   Current Outpatient Medications  Medication Sig Dispense Refill   amiodarone  (PACERONE ) 200 MG tablet TAKE ONE TABLET BY MOUTH ONCE DAILY 90 tablet 0   Bromelains (BROMELAIN PO) Take 2 capsules by mouth daily.     CINNAMON  PO Take 1,200 mg by mouth in the morning and at bedtime. [place] Cinnamon  1200mg      Coenzyme Q10 (COQ10) 400 MG CAPS Take 400 mg by mouth 2 (two) times daily.     docusate sodium  (COLACE) 100 MG capsule 300 mg every evening. Take 300 mg in the pm & 200 mg in the am.     ELIQUIS  5 MG TABS tablet TAKE ONE TABLET BY MOUTH TWICE DAILY 60 tablet 5   glipiZIDE  (GLUCOTROL ) 10 MG tablet TAKE ONE TABLET BY MOUTH TWICE DAILY (Patient taking differently: Take 20 mg by mouth daily.) 180 tablet 0   HAWTHORNE BERRY PO Take 565 mg by mouth at bedtime.     JARDIANCE  10 MG TABS tablet TAKE ONE TABLET BY MOUTH EVERY EVENING 90 tablet 1   levothyroxine  (SYNTHROID ) 25 MCG tablet TAKE ONE TABLET BY MOUTH ONCE DAILY 90 tablet 1   losartan  (COZAAR ) 25 MG tablet  Take 0.5 tablets (12.5 mg total) by mouth daily. 90 tablet 1   magnesium  oxide (MAG-OX) 400 (240 Mg) MG tablet TAKE ONE TABLET BY MOUTH ONCE DAILY AS NEEDED (Patient taking differently: Take 400 mg by mouth daily.) 30 tablet 2   Multiple Vitamins-Minerals (PRESERVISION AREDS 2 PO) Take 1 tablet by mouth in the morning and at bedtime.     potassium chloride  (KLOR-CON ) 10 MEQ tablet Take 4 tablets (40 mEq total) by mouth daily. 336 tablet 3   rosuvastatin  (CRESTOR ) 20 MG tablet TAKE ONE TABLET BY MOUTH ONCE DAILY 90 tablet 0   tiZANidine  (ZANAFLEX ) 4 MG tablet TAKE ONE TABLET BY MOUTH AT BEDTIME AS NEEDED 30 tablet 0   torsemide  (DEMADEX ) 20 MG tablet TAKE TWO TABLETS BY MOUTH TWICE DAILY (Patient taking differently: Take 40 mg by mouth daily.) 180 tablet 0   vitamin C  (ASCORBIC ACID ) 500 MG tablet Take 1,000 mg by mouth 2 (two) times daily.     zinc gluconate 50 MG tablet Take 75 mg by mouth daily.     B-D ULTRAFINE III SHORT PEN 31G X 8 MM MISC 2 (two) times daily as needed.     insulin  glargine, 1 Unit Dial , (TOUJEO  SOLOSTAR) 300 UNIT/ML Solostar Pen Inject 30 Units into the skin as needed. (Patient not  taking: Reported on 05/30/2024)     ketoconazole  (NIZORAL ) 2 % shampoo Apply 1 Application topically 2 (two) times a week. (Patient not taking: Reported on 05/30/2024) 120 mL 3   MELATONIN-THEANINE PO Take 2 capsules by mouth at bedtime. Olly Sleep Gummies (Patient not taking: Reported on 05/30/2024)     metolazone  (ZAROXOLYN ) 2.5 MG tablet TAKE ONE TABLET BY MOUTH ONCE DAILY (Patient not taking: Reported on 05/30/2024) 90 tablet 0   mometasone  (ELOCON ) 0.1 % cream Apply 1 Application topically daily. To areas of psoriasis (Patient not taking: Reported on 05/30/2024) 45 g 11   tirzepatide  (MOUNJARO ) 12.5 MG/0.5ML Pen Inject 15 mg into the skin once a week. (Patient not taking: Reported on 05/30/2024)     traZODone  (DESYREL ) 100 MG tablet Take 1 tablet (100 mg total) by mouth at bedtime. 90 tablet 1    valACYclovir (VALTREX) 1000 MG tablet Take 1,000 mg by mouth daily as needed. (Patient not taking: Reported on 05/30/2024)      Allergies as of 05/30/2024 - Review Complete 05/30/2024  Allergen Reaction Noted   Other Hives and Other (See Comments) 04/02/2019     Review of Systems:    All systems reviewed and negative except where noted in HPI.  Review of Systems  Constitutional:  Positive for malaise/fatigue. Negative for chills and fever.  Respiratory:  Positive for shortness of breath.   Cardiovascular:  Negative for chest pain.  Gastrointestinal:  Negative for abdominal pain, blood in stool and melena.  Skin:  Negative for rash.  Neurological:  Negative for focal weakness.  Psychiatric/Behavioral:  Negative for substance abuse.   All other systems reviewed and are negative.     Physical Exam:  Vital signs in last 24 hours: Temp:  [97.4 F (36.3 C)-98.1 F (36.7 C)] 97.4 F (36.3 C) (12/27 1645) Pulse Rate:  [72-85] 72 (12/27 1549) Resp:  [16-20] 16 (12/27 1549) BP: (117-119)/(52-56) 119/52 (12/27 1549) SpO2:  [95 %-100 %] 100 % (12/27 1549) Weight:  [97.3 kg] 97.3 kg (12/27 1338)   General:   Pleasant in NAD Head:  Normocephalic and atraumatic. Eyes:   No icterus.   Conjunctiva pink. Ears:  Normal auditory acuity. Mouth: Mucosa pink moist, no lesions. Neck:  Supple; no masses felt Lungs: No respiratory distress Heart:  S1S2, RRR, no MRG. No edema. Abdomen:   Flat, soft, nondistended, nontender Msk:   Strength 5/5. Symmetrical without gross deformities. Neurologic:  Alert and  oriented x4; No focal deficits Skin:  Warm, dry, pink without significant lesions or rashes. Psych:  Alert and cooperative. Normal affect.  LAB RESULTS: Recent Labs    05/30/24 1340  WBC 6.5  HGB 6.8*  HCT 22.8*  PLT 232   BMET Recent Labs    05/30/24 1340  NA 135  K 3.8  CL 98  CO2 23  GLUCOSE 88  BUN 40*  CREATININE 2.17*  CALCIUM  8.8*   LFT Recent Labs     05/30/24 1340  PROT 7.0  ALBUMIN 3.7  AST 47*  ALT 49*  ALKPHOS 90  BILITOT 0.3   PT/INR No results for input(s): LABPROT, INR in the last 72 hours.  STUDIES: DG Chest Port 1 View Result Date: 05/30/2024 CLINICAL DATA:  Shortness of breath.  Fatigue. EXAM: PORTABLE CHEST - 1 VIEW COMPARISON:  07/16/2015 FINDINGS: Heart size at upper limits of normal. No pulmonary vascular congestion. Mediastinum within normal limits. Lungs are clear. IMPRESSION: Borderline cardiomegaly. Electronically Signed   By: Aliene Katha HERO.D.  On: 05/30/2024 15:32       Impression / Plan:   73 y.o. y/o gentleman with history of ischemic cardiomyopathy, HFrEF with recovered EF, CKD (GFR 32), DM II, hypothyroidism, and a. Fib on eliquis  (last dose yesterday morning) here with IDA. Needs EGD/Colonoscopy but will need to wait for wash out of his DOAC  - ok for diet tonight, clear liquids tomorrow - will tentatively plan for EGD/Colon on Monday - avoid any heparin  products - maintain active type and screen - transfuse for hemoglobin < 7 - correct electrolytes  Please call with any questions or concerns.  Frederic Schick MD, MPH [facility]      [1]  Social History Tobacco Use   Smoking status: Never   Smokeless tobacco: Never  Vaping Use   Vaping status: Never Used  Substance Use Topics   Alcohol  use: Not Currently    Comment: less than 1   Drug use: Yes    Types: Cocaine, LSD, Marijuana, Opium     Comment: with the exception of marijuana nothing used in 30 years!   "

## 2024-05-30 NOTE — ED Notes (Signed)
 Pt is adamant about not having his finger stuck for bgl and would like his Dexcom to be used, Dexcom is 140. He also mentions that the Glipizide  controls his sugars well and does not need the insulin  and he just had a visit with his endocrinologist discussing that.

## 2024-05-30 NOTE — ED Triage Notes (Signed)
 Pt to ED for fatigue and SOB since 2 weeks. Also low hgb. Hgb was 9's 2 weeks ago and then 7.1 yesterday. Pt is pale.  Pt takes Eliquis  BID, last dose yesterday AM. Has not noticed bleeding from anywhere and no abdominal pain. Has 1 kidney (R). Respirations are unlabored. Pt is ambulatory with cane.

## 2024-05-30 NOTE — H&P (View-Only) (Signed)
 " Consultation  Referring Provider:  Hospitalist    Admit date: 05/30/2024 Consult date: 05/30/2024         Reason for Consultation: IDA              HPI:   [name] is a 73 y.o. y/o gentleman with history of ischemic cardiomyopathy, HFrEF with recovered EF, CKD (GFR 32), DM II, hypothyroidism, and a. Fib on eliquis  (last dose yesterday morning) here with IDA. Has been having increasing shortness of breath over the past few weeks and hemoglobin has dropped from 9 a few weeks ago at his cardiology office to 7.1 yesterday to 6. 8 today. He was called to come to the ED for a blood transfusion. No overt GI bleeding. He endorses weight loss but mostly intentional. No alcohol  use currently. No known family history of GI malignancies. [name] colonoscopy naive. History of partial nephrectomy. He endorses some ice cravings. No NSAIDS.  Past Medical History:  Diagnosis Date   Allergy [phone]   Anxiety    Mild - Intermiteant   Cataract    early stage   CHF (congestive heart failure) (HCC)    CKD (chronic kidney disease) stage 3, GFR 30-59 ml/min (HCC)    Coronary artery disease    a. 1998 s/p ACS Multi-link BMS to the prox LAD ([place], [place]); b. 07/2015 Cath: LM nl, LAD patent stent, LCX min irregs, OM1/2 min irregs, OM3 nl, RCA min irregs; c. 07/2020 Cath: LM nl, LAD 50p/m, 70d, D2 70, LCX 40p/25m, OM3 40, RCA 60d->Med Rx.   Depression    Intermiteant   Diabetes mellitus type 2, uncontrolled    a. A1c 11.0 in 07/2015.   Glaucoma    Resolved with laser   HFrEF    a. EF 25% by cath in 2013; b. Echo in 07/2015 showing EF of 15-20%, moderate MR, moderate Pulm HTN, severely dilated IVC; c. 11/2016 Echo: EF 55-60%, no rwma, Gr1 DD, mildly dil LA, nl RV fxn. d. 12/2019 Echo: EF 40-45%, e. 07/2020 Echo: EF 20-25%, sev red RV fxx; f. 02/2021 Echo: EF 40-45%, glob HK/signif sept HK. Nl RV fxn.   History of kidney stones    Hypertension    Hypothyroidism 08/15/2023   Ischemic cardiomyopathy     Kidney stones    Left   Myocardial infarction [facility])    Paroxysmal atrial fibrillation (HCC)    a. Dx 07/2015; b. CHA2DS2VASc = 5-->eliquis . Rhythm control w/ amiodarone .   Pollen allergies 09/21/2015   pt called and stated that he woke up and had some drainage-pt states he did not see the color of the drainage-pt denies running a fever and this only happened once-pt instructed to call Dr Geneva office if he starts running a fever or if the color of drainage becomes yellow/green   Psoriasis    Pulmonary hypertension (HCC)    Sleep apnea     Past Surgical History:  Procedure Laterality Date   CARDIAC CATHETERIZATION N/A 07/25/2015   Procedure: Right/Left Heart Cath and Coronary Angiography;  Surgeon: Deatrice DELENA Cage, MD;  Location: [facility];  Service: Cardiovascular;  Laterality: N/A;   CARDIAC CATHE[place]     [place]   CARDIAC CATHETERIZATION     [place], [place]   CORONARY ANGIOPLASTY WITH STENT PLACEMENT     CYSTOSCOPY WITH STENT PLACEMENT Left 09/07/2015   Procedure: CYSTOSCOPY WITH STENT PLACEMENT;  Surgeon: Rosina Riis, MD;  Location: [facility];  Service: Urology;  Laterality: Left;  CYSTOSCOPY/URETEROSCOPY/HOLMIUM LASER/STENT PLACEMENT Left 10/25/2015   Procedure: CYSTOSCOPY/URETEROSCOPY/HOLMIUM LASER/STENT EXCHANGE;  Surgeon: Rosina Riis, MD;  Location: [facility];  Service: Urology;  Laterality: Left;   CYSTOSCOPY/URETEROSCOPY/HOLMIUM LASER/STENT PLACEMENT Left 05/11/2019   Procedure: CYSTOSCOPY/URETEROSCOPY/HOLMIUM LASER/STENT PLACEMENT;  Surgeon: Riis Rosina, MD;  Location: [facility];  Service: Urology;  Laterality: Left;   EXTRACORPOREAL SHOCK WAVE LITHOTRIPSY Left 04/02/2019   Procedure: EXTRACORPOREAL SHOCK WAVE LITHOTRIPSY (ESWL);  Surgeon: Riis Rosina, MD;  Location: [facility];  Service: Urology;  Laterality: Left;   EXTRACORPOREAL SHOCK WAVE LITHOTRIPSY Left 03/05/2019   Procedure: EXTRACORPOREAL SHOCK WAVE LITHOTRIPSY (ESWL);  Surgeon:  Twylla Glendia BROCKS, MD;  Location: [facility];  Service: Urology;  Laterality: Left;   EYE SURGERY Bilateral    LASER FOR GLAUCOMA   IRRIGATION AND DEBRIDEMENT FOOT Left 09/30/2020   Procedure: IRRIGATION AND DEBRIDEMENT FOOT;  Surgeon: Silva Juliene SAUNDERS, DPM;  Location: [facility];  Service: Podiatry;  Laterality: Left;   KIDNEY SURGERY     LOWER EXTREMITY ANGIOGRAPHY Left 10/04/2020   Procedure: Lower Extremity Angiography;  Surgeon: Jama Cordella MATSU, MD;  Location: [facility];  Service: Cardiovascular;  Laterality: Left;   NEPHRECTOMY Left    NEPHROSTOMY TUBE PLACEMENT ([facility]) Left 11/2019   RIGHT/LEFT HEART CATH AND CORONARY ANGIOGRAPHY N/A 07/12/2020   Procedure: RIGHT/LEFT HEART CATH AND CORONARY ANGIOGRAPHY;  Surgeon: Mady Bruckner, MD;  Location: [facility];  Service: Cardiovascular;  Laterality: N/A;   ROBOT ASSISTED LAPAROSCOPIC NEPHRECTOMY Left 12/10/2019   Procedure: XI ROBOTIC ASSISTED LAPAROSCOPIC NEPHRECTOMY;  Surgeon: Riis Rosina, MD;  Location: [facility];  Service: Urology;  Laterality: Left;   URETEROSCOPY WITH HOLMIUM LASER LITHOTRIPSY Left 09/07/2015   Procedure: URETEROSCOPY WITH HOLMIUM LASER LITHOTRIPSY;  Surgeon: Rosina Riis, MD;  Location: [facility];  Service: Urology;  Laterality: Left;    Family History  Problem Relation Age of Onset   Heart failure Father    Arthritis Father    Heart disease Father    Hypertension Father    Heart attack Brother    Diabetes Brother    Heart disease Brother    Hypertension Brother    Birth defects Sister    Early death Sister    Early death Brother    Heart disease Brother    Hypertension Brother    Kidney cancer Neg Hx    Bladder Cancer Neg Hx    Prostate cancer Neg Hx     Social History[1]  Prior to Admission medications  Medication Sig Start Date End Date Taking? Authorizing Provider  amiodarone  (PACERONE ) 200 MG tablet TAKE ONE TABLET BY MOUTH ONCE DAILY 03/06/24  Yes Bernardo Fend,  DO  Bromelains (BROMELAIN PO) Take 2 capsules by mouth daily.   Yes [provider]  CINNAMON  PO Take 1,200 mg by mouth in the morning and at bedtime. [place] Cinnamon  1200mg    Yes [provider]  Coenzyme Q10 (COQ10) 400 MG CAPS Take 400 mg by mouth 2 (two) times daily.   Yes [provider]  docusate sodium  (COLACE) 100 MG capsule 300 mg every evening. Take 300 mg in the pm & 200 mg in the am.   Yes [provider]  ELIQUIS  5 MG TABS tablet TAKE ONE TABLET BY MOUTH TWICE DAILY 02/21/24  Yes Bernardo Fend, DO  glipiZIDE  (GLUCOTROL ) 10 MG tablet TAKE ONE TABLET BY MOUTH TWICE DAILY Patient taking differently: Take 20 mg by mouth daily. 03/23/24  Yes Bernardo Fend, DO  HAWTHORNE BERRY PO Take 565 mg by  mouth at bedtime.   Yes [provider]  JARDIANCE  10 MG TABS tablet TAKE ONE TABLET BY MOUTH EVERY EVENING 03/25/24  Yes Pender, Julie F, FNP  levothyroxine  (SYNTHROID ) 25 MCG tablet TAKE ONE TABLET BY MOUTH ONCE DAILY 04/08/24  Yes Bernardo Fend, DO  losartan  (COZAAR ) 25 MG tablet Take 0.5 tablets (12.5 mg total) by mouth daily. 08/15/23  Yes Bernardo Fend, DO  magnesium  oxide (MAG-OX) 400 (240 Mg) MG tablet TAKE ONE TABLET BY MOUTH ONCE DAILY AS NEEDED Patient taking differently: Take 400 mg by mouth daily. 01/31/23  Yes Bernardo Fend, DO  Multiple Vitamins-Minerals (PRESERVISION AREDS 2 PO) Take 1 tablet by mouth in the morning and at bedtime.   Yes [provider]  potassium chloride  (KLOR-CON ) 10 MEQ tablet Take 4 tablets (40 mEq total) by mouth daily. 05/15/24  Yes End, Lonni, MD  rosuvastatin  (CRESTOR ) 20 MG tablet TAKE ONE TABLET BY MOUTH ONCE DAILY 05/11/24  Yes Bernardo Fend, DO  tiZANidine  (ZANAFLEX ) 4 MG tablet TAKE ONE TABLET BY MOUTH AT BEDTIME AS NEEDED 03/23/24  Yes Bernardo Fend, DO  torsemide  (DEMADEX ) 20 MG tablet TAKE TWO TABLETS BY MOUTH TWICE DAILY Patient taking differently: Take 40 mg by  mouth daily. 03/23/24  Yes Bernardo Fend, DO  vitamin C  (ASCORBIC ACID ) 500 MG tablet Take 1,000 mg by mouth 2 (two) times daily.   Yes [provider]  zinc gluconate 50 MG tablet Take 75 mg by mouth daily.   Yes [provider]  B-D ULTRAFINE III SHORT PEN 31G X 8 MM MISC 2 (two) times daily as needed. 01/21/23   [provider]  insulin  glargine, 1 Unit Dial , (TOUJEO  SOLOSTAR) 300 UNIT/ML Solostar Pen Inject 30 Units into the skin as needed. Patient not taking: Reported on 05/30/2024 02/14/24   Bernardo Fend, DO  ketoconazole  (NIZORAL ) 2 % shampoo Apply 1 Application topically 2 (two) times a week. Patient not taking: Reported on 05/30/2024 01/29/22   Hester Alm BROCKS, MD  MELATONIN-THEANINE PO Take 2 capsules by mouth at bedtime. Olly Sleep Gummies Patient not taking: Reported on 05/30/2024    [provider]  metolazone  (ZAROXOLYN ) 2.5 MG tablet TAKE ONE TABLET BY MOUTH ONCE DAILY Patient not taking: Reported on 05/30/2024 05/22/24   Bernardo Fend, DO  mometasone  (ELOCON ) 0.1 % cream Apply 1 Application topically daily. To areas of psoriasis Patient not taking: Reported on 05/30/2024 01/29/22   Hester Alm BROCKS, MD  tirzepatide  (MOUNJARO ) 12.5 MG/0.5ML Pen Inject 15 mg into the skin once a week. Patient not taking: Reported on 05/30/2024 02/14/24   Bernardo Fend, DO  traZODone  (DESYREL ) 100 MG tablet Take 1 tablet (100 mg total) by mouth at bedtime. 02/14/24   Bernardo Fend, DO  valACYclovir (VALTREX) 1000 MG tablet Take 1,000 mg by mouth daily as needed. Patient not taking: Reported on 05/30/2024 08/01/20   [provider]    Current Facility-Administered Medications  Medication Dose Route Frequency Provider Last Rate Last Admin   0.9 %  sodium chloride  infusion  10 mL/hr Intravenous Once Smith, Dylan, MD       acetaminophen  (TYLENOL ) tablet 650 mg  650 mg Oral Q6H PRN Pahwani, Rinka R, MD       Or   acetaminophen   (TYLENOL ) suppository 650 mg  650 mg Rectal Q6H PRN Pahwani, Rinka R, MD       amiodarone  (PACERONE ) tablet 200 mg  200 mg Oral Daily Pahwani, Rinka R, MD   200 mg at 05/30/24 1731  insulin  aspart (novoLOG ) injection 0-15 Units  0-15 Units Subcutaneous TID WC Pahwani, Rinka R, MD       insulin  aspart (novoLOG ) injection 0-5 Units  0-5 Units Subcutaneous QHS Pahwani, Rinka R, MD       levothyroxine  (SYNTHROID ) tablet 25 mcg  25 mcg Oral Daily Pahwani, Rinka R, MD   25 mcg at 05/30/24 1731   ondansetron  (ZOFRAN ) tablet 4 mg  4 mg Oral Q6H PRN Pahwani, Rinka R, MD       Or   ondansetron  (ZOFRAN ) injection 4 mg  4 mg Intravenous Q6H PRN Pahwani, Rinka R, MD       pantoprazole  (PROTONIX ) injection 40 mg  40 mg Intravenous Q12H Pahwani, Rinka R, MD       pantoprazole  (PROTONIX ) injection 40 mg  40 mg Intravenous Once Park, Leonor BROCKS, RPH       rosuvastatin  (CRESTOR ) tablet 20 mg  20 mg Oral Daily Pahwani, Rinka R, MD   20 mg at 05/30/24 1731   Current Outpatient Medications  Medication Sig Dispense Refill   amiodarone  (PACERONE ) 200 MG tablet TAKE ONE TABLET BY MOUTH ONCE DAILY 90 tablet 0   Bromelains (BROMELAIN PO) Take 2 capsules by mouth daily.     CINNAMON  PO Take 1,200 mg by mouth in the morning and at bedtime. [place] Cinnamon  1200mg      Coenzyme Q10 (COQ10) 400 MG CAPS Take 400 mg by mouth 2 (two) times daily.     docusate sodium  (COLACE) 100 MG capsule 300 mg every evening. Take 300 mg in the pm & 200 mg in the am.     ELIQUIS  5 MG TABS tablet TAKE ONE TABLET BY MOUTH TWICE DAILY 60 tablet 5   glipiZIDE  (GLUCOTROL ) 10 MG tablet TAKE ONE TABLET BY MOUTH TWICE DAILY (Patient taking differently: Take 20 mg by mouth daily.) 180 tablet 0   HAWTHORNE BERRY PO Take 565 mg by mouth at bedtime.     JARDIANCE  10 MG TABS tablet TAKE ONE TABLET BY MOUTH EVERY EVENING 90 tablet 1   levothyroxine  (SYNTHROID ) 25 MCG tablet TAKE ONE TABLET BY MOUTH ONCE DAILY 90 tablet 1   losartan  (COZAAR ) 25 MG tablet  Take 0.5 tablets (12.5 mg total) by mouth daily. 90 tablet 1   magnesium  oxide (MAG-OX) 400 (240 Mg) MG tablet TAKE ONE TABLET BY MOUTH ONCE DAILY AS NEEDED (Patient taking differently: Take 400 mg by mouth daily.) 30 tablet 2   Multiple Vitamins-Minerals (PRESERVISION AREDS 2 PO) Take 1 tablet by mouth in the morning and at bedtime.     potassium chloride  (KLOR-CON ) 10 MEQ tablet Take 4 tablets (40 mEq total) by mouth daily. 336 tablet 3   rosuvastatin  (CRESTOR ) 20 MG tablet TAKE ONE TABLET BY MOUTH ONCE DAILY 90 tablet 0   tiZANidine  (ZANAFLEX ) 4 MG tablet TAKE ONE TABLET BY MOUTH AT BEDTIME AS NEEDED 30 tablet 0   torsemide  (DEMADEX ) 20 MG tablet TAKE TWO TABLETS BY MOUTH TWICE DAILY (Patient taking differently: Take 40 mg by mouth daily.) 180 tablet 0   vitamin C  (ASCORBIC ACID ) 500 MG tablet Take 1,000 mg by mouth 2 (two) times daily.     zinc gluconate 50 MG tablet Take 75 mg by mouth daily.     B-D ULTRAFINE III SHORT PEN 31G X 8 MM MISC 2 (two) times daily as needed.     insulin  glargine, 1 Unit Dial , (TOUJEO  SOLOSTAR) 300 UNIT/ML Solostar Pen Inject 30 Units into the skin as needed. (Patient not  taking: Reported on 05/30/2024)     ketoconazole  (NIZORAL ) 2 % shampoo Apply 1 Application topically 2 (two) times a week. (Patient not taking: Reported on 05/30/2024) 120 mL 3   MELATONIN-THEANINE PO Take 2 capsules by mouth at bedtime. Olly Sleep Gummies (Patient not taking: Reported on 05/30/2024)     metolazone  (ZAROXOLYN ) 2.5 MG tablet TAKE ONE TABLET BY MOUTH ONCE DAILY (Patient not taking: Reported on 05/30/2024) 90 tablet 0   mometasone  (ELOCON ) 0.1 % cream Apply 1 Application topically daily. To areas of psoriasis (Patient not taking: Reported on 05/30/2024) 45 g 11   tirzepatide  (MOUNJARO ) 12.5 MG/0.5ML Pen Inject 15 mg into the skin once a week. (Patient not taking: Reported on 05/30/2024)     traZODone  (DESYREL ) 100 MG tablet Take 1 tablet (100 mg total) by mouth at bedtime. 90 tablet 1    valACYclovir (VALTREX) 1000 MG tablet Take 1,000 mg by mouth daily as needed. (Patient not taking: Reported on 05/30/2024)      Allergies as of 05/30/2024 - Review Complete 05/30/2024  Allergen Reaction Noted   Other Hives and Other (See Comments) 04/02/2019     Review of Systems:    All systems reviewed and negative except where noted in HPI.  Review of Systems  Constitutional:  Positive for malaise/fatigue. Negative for chills and fever.  Respiratory:  Positive for shortness of breath.   Cardiovascular:  Negative for chest pain.  Gastrointestinal:  Negative for abdominal pain, blood in stool and melena.  Skin:  Negative for rash.  Neurological:  Negative for focal weakness.  Psychiatric/Behavioral:  Negative for substance abuse.   All other systems reviewed and are negative.     Physical Exam:  Vital signs in last 24 hours: Temp:  [97.4 F (36.3 C)-98.1 F (36.7 C)] 97.4 F (36.3 C) (12/27 1645) Pulse Rate:  [72-85] 72 (12/27 1549) Resp:  [16-20] 16 (12/27 1549) BP: (117-119)/(52-56) 119/52 (12/27 1549) SpO2:  [95 %-100 %] 100 % (12/27 1549) Weight:  [97.3 kg] 97.3 kg (12/27 1338)   General:   Pleasant in NAD Head:  Normocephalic and atraumatic. Eyes:   No icterus.   Conjunctiva pink. Ears:  Normal auditory acuity. Mouth: Mucosa pink moist, no lesions. Neck:  Supple; no masses felt Lungs: No respiratory distress Heart:  S1S2, RRR, no MRG. No edema. Abdomen:   Flat, soft, nondistended, nontender Msk:   Strength 5/5. Symmetrical without gross deformities. Neurologic:  Alert and  oriented x4; No focal deficits Skin:  Warm, dry, pink without significant lesions or rashes. Psych:  Alert and cooperative. Normal affect.  LAB RESULTS: Recent Labs    05/30/24 1340  WBC 6.5  HGB 6.8*  HCT 22.8*  PLT 232   BMET Recent Labs    05/30/24 1340  NA 135  K 3.8  CL 98  CO2 23  GLUCOSE 88  BUN 40*  CREATININE 2.17*  CALCIUM  8.8*   LFT Recent Labs     05/30/24 1340  PROT 7.0  ALBUMIN 3.7  AST 47*  ALT 49*  ALKPHOS 90  BILITOT 0.3   PT/INR No results for input(s): LABPROT, INR in the last 72 hours.  STUDIES: DG Chest Port 1 View Result Date: 05/30/2024 CLINICAL DATA:  Shortness of breath.  Fatigue. EXAM: PORTABLE CHEST - 1 VIEW COMPARISON:  07/16/2015 FINDINGS: Heart size at upper limits of normal. No pulmonary vascular congestion. Mediastinum within normal limits. Lungs are clear. IMPRESSION: Borderline cardiomegaly. Electronically Signed   By: Aliene Katha HERO.D.  On: 05/30/2024 15:32       Impression / Plan:   73 y.o. y/o gentleman with history of ischemic cardiomyopathy, HFrEF with recovered EF, CKD (GFR 32), DM II, hypothyroidism, and a. Fib on eliquis  (last dose yesterday morning) here with IDA. Needs EGD/Colonoscopy but will need to wait for wash out of his DOAC  - ok for diet tonight, clear liquids tomorrow - will tentatively plan for EGD/Colon on Monday - avoid any heparin  products - maintain active type and screen - transfuse for hemoglobin < 7 - correct electrolytes  Please call with any questions or concerns.  Frederic Schick MD, MPH [facility]      [1]  Social History Tobacco Use   Smoking status: Never   Smokeless tobacco: Never  Vaping Use   Vaping status: Never Used  Substance Use Topics   Alcohol  use: Not Currently    Comment: less than 1   Drug use: Yes    Types: Cocaine, LSD, Marijuana, Opium     Comment: with the exception of marijuana nothing used in 30 years!   "

## 2024-05-30 NOTE — H&P (Addendum)
 " History and Physical    [name]:[id number] DOB: [birth year] DOA: 05/30/2024  PCP: Bernardo Fend, DO  Patient coming from: Home  I have personally briefly reviewed patient's old medical records in [facility]  Chief Complaint: Not feeling well  HPI: [name] is a 73 y.o. male with medical history significant of ischemic cardiomyopathy, CAD, paroxysmal A-fib on Eliquis , CKD status post left nephrectomy, type 2 diabetes, OSA presented with shortness of breath with exertion, fatigue since 2 weeks.  His hemoglobin dropped from 9.3-7.1 at cardiology office.  Patient denies history of per rectal bleeding, malena-he got call from his cardiologist this morning and was recommended ED evaluation and blood transfusion.  He was recommended to hold off Eliquis  until further workup done for his severe anemia.  Patient reports that he has worsening shortness of breath with exertion and feeling tired for the past 6 weeks however getting worse since 2 weeks.  No chest pain, palpitations, lightheadedness, dizziness, presyncope or syncopal episode.  Patient denies any chest pain, lightheadedness, dizziness, abdominal pain, nausea, vomiting, poor appetite, generalized weakness or lethargy.  Does not take over-the-counter NSAIDs.  His last dose of Eliquis  was yesterday  No history of tobacco abuse, alcohol  abuse however uses marijuana occasionally.  Using cane for ambulation.  Lives alone and independent on daily life activities.  ED Course: Upon arrival to ED: Patient afebrile, pulse 85, BP 117/56, maintaining oxygen saturation on room air.  NA: 135, K: 3.8, BUN: 40, creatinine 2.17, GFR 32, AST 47, ALT 49, WBC 6.5, H&H 6.8/22.8, PLT 232.  EDP ordered 1 PRBC transfusion.  Prior hospitalist consulted for admission.  Review of Systems: As per HPI otherwise negative.    Past Medical History:  Diagnosis Date   Allergy [phone]   Anxiety    Mild - Intermiteant   Cataract    early  stage   CHF (congestive heart failure) (HCC)    CKD (chronic kidney disease) stage 3, GFR 30-59 ml/min (HCC)    Coronary artery disease    a. 1998 s/p ACS Multi-link BMS to the prox LAD ([place], [place]); b. 07/2015 Cath: LM nl, LAD patent stent, LCX min irregs, OM1/2 min irregs, OM3 nl, RCA min irregs; c. 07/2020 Cath: LM nl, LAD 50p/m, 70d, D2 70, LCX 40p/51m, OM3 40, RCA 60d->Med Rx.   Depression    Intermiteant   Diabetes mellitus type 2, uncontrolled    a. A1c 11.0 in 07/2015.   Glaucoma    Resolved with laser   HFrEF    a. EF 25% by cath in 2013; b. Echo in 07/2015 showing EF of 15-20%, moderate MR, moderate Pulm HTN, severely dilated IVC; c. 11/2016 Echo: EF 55-60%, no rwma, Gr1 DD, mildly dil LA, nl RV fxn. d. 12/2019 Echo: EF 40-45%, e. 07/2020 Echo: EF 20-25%, sev red RV fxx; f. 02/2021 Echo: EF 40-45%, glob HK/signif sept HK. Nl RV fxn.   History of kidney stones    Hypertension    Hypothyroidism 08/15/2023   Ischemic cardiomyopathy    Kidney stones    Left   Myocardial infarction [facility])    Paroxysmal atrial fibrillation (HCC)    a. Dx 07/2015; b. CHA2DS2VASc = 5-->eliquis . Rhythm control w/ amiodarone .   Pollen allergies 09/21/2015   pt called and stated that he woke up and had some drainage-pt states he did not see the color of the drainage-pt denies running a fever and this only happened once-pt instructed to call Dr Geneva office  if he starts running a fever or if the color of drainage becomes yellow/green   Psoriasis    Pulmonary hypertension (HCC)    Sleep apnea     Past Surgical History:  Procedure Laterality Date   CARDIAC CATHETERIZATION N/A 07/25/2015   Procedure: Right/Left Heart Cath and Coronary Angiography;  Surgeon: Deatrice DELENA Cage, MD;  Location: [facility]IVE [facility];  Service: Cardiovascular;  Laterality: N/A;   CARDIAC CATHE[place]IZATION     [place]   CARDIAC CATHETERIZATION     [place], [place]   CORONARY ANGIOPLASTY WITH STENT PLACEMENT      CYSTOSCOPY WITH STENT PLACEMENT Left 09/07/2015   Procedure: CYSTOSCOPY WITH STENT PLACEMENT;  Surgeon: Rosina Riis, MD;  Location: [facility] OR[facility];  Service: Urology;  Laterality: Left;   CYSTOSCOPY/URETEROSCOPY/HOLMIUM LASER/STENT PLACEMENT Left 10/25/2015   Procedure: CYSTOSCOPY/URETEROSCOPY/HOLMIUM LASER/STENT EXCHANGE;  Surgeon: Rosina Riis, MD;  Location: [facility];  Service: Urology;  Laterality: Left;   CYSTOSCOPY/URETEROSCOPY/HOLMIUM LASER/STENT PLACEMENT Left 05/11/2019   Procedure: CYSTOSCOPY/URETEROSCOPY/HOLMIUM LASER/STENT PLACEMENT;  Surgeon: Riis Rosina, MD;  Location: [facility];  Service: Urology;  Laterality: Left;   EXTRACORPOREAL SHOCK WAVE LITHOTRIPSY Left 04/02/2019   Procedure: EXTRACORPOREAL SHOCK WAVE LITHOTRIPSY (ESWL);  Surgeon: Riis Rosina, MD;  Location: [facility];  Service: Urology;  Laterality: Left;   EXTRACORPOREAL SHOCK WAVE LITHOTRIPSY Left 03/05/2019   Procedure: EXTRACORPOREAL SHOCK WAVE LITHOTRIPSY (ESWL);  Surgeon: Twylla Glendia BROCKS, MD;  Location: [facility];  Service: Urology;  Laterality: Left;   EYE SURGERY Bilateral    LASER FOR GLAUCOMA   IRRIGATION AND DEBRIDEMENT FOOT Left 09/30/2020   Procedure: IRRIGATION AND DEBRIDEMENT FOOT;  Surgeon: Silva Juliene SAUNDERS, DPM;  Location: [facility];  Service: Podiatry;  Laterality: Left;   KIDNEY SURGERY     LOWER EXTREMITY ANGIOGRAPHY Left 10/04/2020   Procedure: Lower Extremity Angiography;  Surgeon: Jama Cordella MATSU, MD;  Location: [facility];  Service: Cardiovascular;  Laterality: Left;   NEPHRECTOMY Left    NEPHROSTOMY TUBE PLACEMENT ([facility] HX) Left 11/2019   RIGHT/LEFT HEART CATH AND CORONARY ANGIOGRAPHY N/A 07/12/2020   Procedure: RIGHT/LEFT HEART CATH AND CORONARY ANGIOGRAPHY;  Surgeon: Mady Bruckner, MD;  Location: [facility];  Service: Cardiovascular;  Laterality: N/A;   ROBOT ASSISTED LAPAROSCOPIC NEPHRECTOMY Left 12/10/2019   Procedure: XI ROBOTIC ASSISTED LAPAROSCOPIC  NEPHRECTOMY;  Surgeon: Riis Rosina, MD;  Location: [facility];  Service: Urology;  Laterality: Left;   URETEROSCOPY WITH HOLMIUM LASER LITHOTRIPSY Left 09/07/2015   Procedure: URETEROSCOPY WITH HOLMIUM LASER LITHOTRIPSY;  Surgeon: Rosina Riis, MD;  Location: [facility];  Service: Urology;  Laterality: Left;     reports that he has never smoked. He has never used smokeless tobacco. He reports that he does not currently use alcohol . He reports current drug use. Drugs: Cocaine, LSD, Marijuana, and Opium .  Allergies[1]  Family History  Problem Relation Age of Onset   Heart failure Father    Arthritis Father    Heart disease Father    Hypertension Father    Heart attack Brother    Diabetes Brother    Heart disease Brother    Hypertension Brother    Birth defects Sister    Early death Sister    Early death Brother    Heart disease Brother    Hypertension Brother    Kidney cancer Neg Hx    Bladder Cancer Neg Hx    Prostate cancer Neg Hx     Prior to Admission medications  Medication Sig Start Date End Date Taking?  Authorizing Provider  amiodarone  (PACERONE ) 200 MG tablet TAKE ONE TABLET BY MOUTH ONCE DAILY 03/06/24   Bernardo Fend, DO  B-D ULTRAFINE III SHORT PEN 31G X 8 MM MISC 2 (two) times daily as needed. 01/21/23   [provider]  Bromelains (BROMELAIN PO) Take 2 capsules by mouth daily.    [provider]  CINNAMON  PO Take 1,200 mg by mouth in the morning and at bedtime. [place] Cinnamon  1200mg     [provider]  Coenzyme Q10 (COQ10) 400 MG CAPS Take 400 mg by mouth 2 (two) times daily.    [provider]  docusate sodium  (COLACE) 100 MG capsule 2 (two) times daily. Take 300 mg in the pm & 200 mg in the am.    [provider]  ELIQUIS  5 MG TABS tablet TAKE ONE TABLET BY MOUTH TWICE DAILY 02/21/24   Bernardo Fend, DO  glipiZIDE  (GLUCOTROL ) 10 MG tablet TAKE ONE TABLET BY MOUTH TWICE DAILY 03/23/24   Bernardo Fend, DO   HAWTHORNE BERRY PO Take 565 mg by mouth at bedtime.    [provider]  insulin  glargine, 1 Unit Dial , (TOUJEO  SOLOSTAR) 300 UNIT/ML Solostar Pen Inject 30 Units into the skin as needed. 02/14/24   Bernardo Fend, DO  JARDIANCE  10 MG TABS tablet TAKE ONE TABLET BY MOUTH EVERY EVENING 03/25/24   Pender, Julie F, FNP  ketoconazole  (NIZORAL ) 2 % shampoo Apply 1 Application topically 2 (two) times a week. 01/29/22   Hester Alm BROCKS, MD  levothyroxine  (SYNTHROID ) 25 MCG tablet TAKE ONE TABLET BY MOUTH ONCE DAILY 04/08/24   Bernardo Fend, DO  losartan  (COZAAR ) 25 MG tablet Take 0.5 tablets (12.5 mg total) by mouth daily. 08/15/23   Bernardo Fend, DO  magnesium  oxide (MAG-OX) 400 (240 Mg) MG tablet TAKE ONE TABLET BY MOUTH ONCE DAILY AS NEEDED 01/31/23   Bernardo Fend, DO  MELATONIN-THEANINE PO Take 2 capsules by mouth at bedtime. Olly Sleep Gummies    [provider]  metolazone  (ZAROXOLYN ) 2.5 MG tablet TAKE ONE TABLET BY MOUTH ONCE DAILY 05/22/24   Bernardo Fend, DO  mometasone  (ELOCON ) 0.1 % cream Apply 1 Application topically daily. To areas of psoriasis 01/29/22   Hester Alm BROCKS, MD  Multiple Vitamins-Minerals (PRESERVISION AREDS 2 PO) Take 1 tablet by mouth in the morning and at bedtime.    [provider]  potassium chloride  (KLOR-CON ) 10 MEQ tablet Take 4 tablets (40 mEq total) by mouth daily. 05/15/24   End, Lonni, MD  rosuvastatin  (CRESTOR ) 20 MG tablet TAKE ONE TABLET BY MOUTH ONCE DAILY 05/11/24   Bernardo Fend, DO  tirzepatide  (MOUNJARO ) 12.5 MG/0.5ML Pen Inject 15 mg into the skin once a week. 02/14/24   Bernardo Fend, DO  tiZANidine  (ZANAFLEX ) 4 MG tablet TAKE ONE TABLET BY MOUTH AT BEDTIME AS NEEDED 03/23/24   Bernardo Fend, DO  torsemide  (DEMADEX ) 20 MG tablet TAKE TWO TABLETS BY MOUTH TWICE DAILY 03/23/24   Bernardo Fend, DO  traZODone  (DESYREL ) 100 MG tablet Take 1 tablet (100 mg total) by mouth at bedtime.  02/14/24   Bernardo Fend, DO  valACYclovir (VALTREX) 1000 MG tablet Take 1,000 mg by mouth daily as needed. 08/01/20   [provider]  vitamin C  (ASCORBIC ACID ) 500 MG tablet Take 500 mg by mouth at bedtime.    [provider]  zinc gluconate 50 MG tablet Take 50 mg by mouth daily.    [provider]    Physical Exam: Vitals:   05/30/24 1335  05/30/24 1338  BP: (!) 117/56   Pulse: 85   Resp: 20   Temp: 98.1 F (36.7 C)   TempSrc: Oral   SpO2: 95%   Weight:  97.3 kg  Height:  6' (1.829 m)    Constitutional: NAD, calm, comfortable, appears pale, on room air, communicating well, obese Eyes: PERRL, lids and conjunctivae normal ENMT: Mucous membranes are moist. Posterior pharynx clear of any exudate or lesions.Normal dentition.  Neck: normal, supple, no masses, no thyromegaly Respiratory: clear to auscultation bilaterally, no wheezing, no crackles. Normal respiratory effort. No accessory muscle use.  Cardiovascular: Systolic murmur noted on right upper sternal border.  No rubs or gallop.  Bilateral trace pitting edema positive.   Abdomen abdomen obese.  Nontender.  Bowel sounds positive Musculoskeletal: no clubbing / cyanosis. No joint deformity upper and lower extremities. Good ROM, no contractures. Normal muscle tone.  Skin: no rashes, lesions, ulcers. No induration Neurologic: CN 2-12 grossly intact. Sensation intact, DTR normal. Strength 5/5 in all 4.  Psychiatric: Normal judgment and insight. Alert and oriented x 3. Normal mood.    Labs on Admission: I have personally reviewed following labs and imaging studies  CBC: Recent Labs  Lab 05/30/24 1340  WBC 6.5  HGB 6.8*  HCT 22.8*  MCV 88.7  PLT 232   Basic Metabolic Panel: Recent Labs  Lab 05/30/24 1340  NA 135  K 3.8  CL 98  CO2 23  GLUCOSE 88  BUN 40*  CREATININE 2.17*  CALCIUM  8.8*   GFR: Estimated Creatinine Clearance: 37.8 mL/min (A) (by C-G formula based on SCr of 2.17 mg/dL  (H)). Liver Function Tests: Recent Labs  Lab 05/30/24 1340  AST 47*  ALT 49*  ALKPHOS 90  BILITOT 0.3  PROT 7.0  ALBUMIN 3.7   No results for input(s): LIPASE, AMYLASE in the last 168 hours. No results for input(s): AMMONIA in the last 168 hours. Coagulation Profile: No results for input(s): INR, PROTIME in the last 168 hours. Cardiac Enzymes: No results for input(s): CKTOTAL, CKMB, CKMBINDEX, TROPONINI in the last 168 hours. BNP (last 3 results) No results for input(s): PROBNP in the last 8760 hours. HbA1C: No results for input(s): HGBA1C in the last 72 hours. CBG: No results for input(s): GLUCAP in the last 168 hours. Lipid Profile: No results for input(s): CHOL, HDL, LDLCALC, TRIG, CHOLHDL, LDLDIRECT in the last 72 hours. Thyroid  Function Tests: No results for input(s): TSH, T4TOTAL, FREET4, T3FREE, THYROIDAB in the last 72 hours. Anemia Panel: No results for input(s): VITAMINB12, FOLATE, FERRITIN, TIBC, IRON, RETICCTPCT in the last 72 hours. Urine analysis:    Component Value Date/Time   COLORURINE YELLOW 09/07/2022 0911   APPEARANCEUR CLEAR 09/07/2022 0911   APPEARANCEUR Cloudy (A) 12/02/2019 1304   LABSPEC 1.015 09/07/2022 0911   PHURINE 7.0 09/07/2022 0911   GLUCOSEU 250 (A) 09/07/2022 0911   HGBUR NEGATIVE 09/07/2022 0911   BILIRUBINUR negative 02/14/2024 1350   BILIRUBINUR Negative 12/02/2019 1304   KETONESUR NEGATIVE 09/07/2022 0911   PROTEINUR Negative 02/14/2024 1350   PROTEINUR NEGATIVE 09/07/2022 0911   UROBILINOGEN 0.2 02/14/2024 1350   NITRITE negative 02/14/2024 1350   NITRITE NEGATIVE 09/07/2022 0911   LEUKOCYTESUR Trace (A) 02/14/2024 1350   LEUKOCYTESUR TRACE (A) 09/07/2022 0911    Radiological Exams on Admission: No results found.  EKG: Independently reviewed.  Sinus rhythm.  Prolonged PR interval.  No ST elevation or depression.  Assessment/Plan  GI bleed: Symptomatic anemia: -  Presented with shortness of breath and  fatigue.  His H&H dropped from 9.3-6.8 in just 2 weeks.  He denies bleeding per rectum. - Will admit patient on the floor.  Will hold on Eliquis . - EDP ordered 1 unit PRBC. - Monitor H&H.  Avoid NSAIDs. - Check iron panel and ferritin.  Start PPI IV twice daily - Consult GI-Dr. Maryruth  Type 2 diabetes: - Check A1c.  Hold on home medication.  Start sliding scale insulin .  Coronary artery disease: CHF -Appears euvolemic on exam.   - Followed by cardiology outpatient. - Blood pressure is currently soft.  Will hold off metolazone  at this time.  Paroxysmal A-fib: - Will hold on Eliquis  as above.  Continue amiodarone   Hypertension: Blood pressure is soft.  Will hold off losartan  at this time  Hyperlipidemia: Continue statin  Kidney stage IIIb: CR: 2.17.  At baseline.  Continue to monitor closely  Elevated liver enzyme: - Will repeat CMP tomorrow. - Patient denies alcohol  abuse  Hypothyroidism: Check TSH.  Will continue current dose of levothyroxine    DVT prophylaxis: SCD, hold Eliquis  in the setting of GI bleeding Code Status: Full code Family Communication: None present at bedside.  Plan of care discussed with patient in length and he verbalized understanding and agreed with it. Disposition Plan: To be determined Consults called: GI Admission status: Inpatient   Velna JONELLE Skeeter MD [facility]  If 7PM-7AM, please contact night-coverage [email or web address]  05/30/2024, 2:22 PM        [1]  Allergies Allergen Reactions   Other Hives and Other (See Comments)    Blue cheese Blue cheese Blue cheese   "

## 2024-05-30 NOTE — ED Notes (Signed)
 Pt would like to have the monitor and blood pressure off per his request, willing to keep Spo2 on

## 2024-05-30 NOTE — Telephone Encounter (Signed)
 Lab drawn by Dr. Cherilyn yesterday were reviewed.  Most notable finding is a hemoglobin of 7.1, down from 9.3 when I last saw [name] on 05/13/2024.  He denies bleeding but reports feeling poorly with significant dyspnea on exertion and fatigue.  I have recommended that he go to the ED for further evaluation and PRBC transfusion if his hemoglobin is confirmed to be this low.  He should also hold his apixaban  pending further workup of his severe anemia.  He voices understanding of this plan and will go to the ED for evaluation.  Lonni Hanson, MD [facility]

## 2024-05-30 NOTE — ED Provider Notes (Signed)
 "  [facility] Provider Note    Event Date/Time   First MD Initiated Contact with Patient 05/30/24 1350     (approximate)   History   Fatigue, Shortness of Breath, and low hemoglobin   HPI  [name] is a 73 y.o. male who presents to the ED for evaluation of Fatigue, Shortness of Breath, and low hemoglobin   Review of cardiology clinic visit from 12/10.  Ischemic cardiomyopathy, CAD, paroxysmal A-fib, CKD s/p left nephrectomy, DM, OSA.  Anticoagulated on Eliquis   Patient presents with progressively worsening subacute fatigue and exertional dyspnea.  No bleeding symptoms.  Cannot recall any endoscopies in the past 30 years   Physical Exam   Triage Vital Signs: ED Triage Vitals  Encounter Vitals Group     BP 05/30/24 1335 (!) 117/56     Girls Systolic BP Percentile --      Girls Diastolic BP Percentile --      Boys Systolic BP Percentile --      Boys Diastolic BP Percentile --      Pulse Rate 05/30/24 1335 85     Resp 05/30/24 1335 20     Temp 05/30/24 1335 98.1 F (36.7 C)     Temp Source 05/30/24 1335 Oral     SpO2 05/30/24 1335 95 %     Weight 05/30/24 1338 214 lb 6.4 oz (97.3 kg)     Height 05/30/24 1338 6' (1.829 m)     Head Circumference --      Peak Flow --      Pain Score 05/30/24 1336 0     Pain Loc --      Pain Education --      Exclude from Growth Chart --     Most recent vital signs: Vitals:   05/30/24 1335  BP: (!) 117/56  Pulse: 85  Resp: 20  Temp: 98.1 F (36.7 C)  SpO2: 95%    General: Awake, no distress.  CV:  Good peripheral perfusion.  Resp:  Normal effort.  Abd:  No distention.  MSK:  No deformity noted.  Neuro:  No focal deficits appreciated. Other:     ED Results / Procedures / Treatments   Labs (all labs ordered are listed, but only abnormal results are displayed) Labs Reviewed  COMPREHENSIVE METABOLIC PANEL WITH GFR - Abnormal; Notable for the following components:      Result Value   BUN  40 (*)    Creatinine, Ser 2.17 (*)    Calcium  8.8 (*)    AST 47 (*)    ALT 49 (*)    GFR, Estimated 32 (*)    All other components within normal limits  CBC - Abnormal; Notable for the following components:   RBC 2.57 (*)    Hemoglobin 6.8 (*)    HCT 22.8 (*)    MCHC 29.8 (*)    RDW 19.6 (*)    All other components within normal limits  TROPONIN T, HIGH SENSITIVITY - Abnormal; Notable for the following components:   Troponin T High Sensitivity 57 (*)    All other components within normal limits  RESP PANEL BY RT-PCR (RSV, FLU A&B, COVID)  RVPGX2  MAGNESIUM   IRON AND TIBC  FERRITIN  HEMOGLOBIN A1C  TSH  POC OCCULT BLOOD, ED  TYPE AND SCREEN  PREPARE RBC (CROSSMATCH)    EKG Sinus rhythm with a rate of 75 bpm, right bundle left anterior fascicular blocks, no STEMI  RADIOLOGY CXR interpreted  by me without evidence of acute cardiopulmonary pathology.  Official radiology report(s): No results found.  PROCEDURES and INTERVENTIONS:  .Critical Care  Performed by: Claudene Rover, MD Authorized by: Claudene Rover, MD   Critical care provider statement:    Critical care time (minutes):  30   Critical care time was exclusive of:  Separately billable procedures and treating other patients   Critical care was necessary to treat or prevent imminent or life-threatening deterioration of the following conditions:  Circulatory failure   Critical care was time spent personally by me on the following activities:  Development of treatment plan with patient or surrogate, discussions with consultants, evaluation of patient's response to treatment, examination of patient, ordering and review of laboratory studies, ordering and review of radiographic studies, ordering and performing treatments and interventions, pulse oximetry, re-evaluation of patient's condition and review of old charts .1-3 Lead EKG Interpretation  Performed by: Claudene Rover, MD Authorized by: Claudene Rover, MD      Interpretation: normal     ECG rate:  80   ECG rate assessment: normal     Rhythm: sinus rhythm     Ectopy: none     Conduction: normal     Medications  0.9 %  sodium chloride  infusion (has no administration in time range)  acetaminophen  (TYLENOL ) tablet 650 mg (has no administration in time range)    Or  acetaminophen  (TYLENOL ) suppository 650 mg (has no administration in time range)  ondansetron  (ZOFRAN ) tablet 4 mg (has no administration in time range)    Or  ondansetron  (ZOFRAN ) injection 4 mg (has no administration in time range)  insulin  aspart (novoLOG ) injection 0-15 Units (has no administration in time range)  insulin  aspart (novoLOG ) injection 0-5 Units (has no administration in time range)     IMPRESSION / MDM / ASSESSMENT AND PLAN / ED COURSE  I reviewed the triage vital signs and the nursing notes.  Differential diagnosis includes, but is not limited to, ACS, PTX, PNA, muscle strain/spasm, PE, dissection, anxiety, pleural effusion  {Patient presents with symptoms of an acute illness or injury that is potentially life-threatening.  Patient presents with symptomatic anemia requiring PRBC transfusion and medical admission.  No particular bleeding symptoms, high suspicion for occult GI losses.  Downtrending hemoglobin is normocytic and ordered 1 unit PRBC.  He otherwise looks well.  Renal dysfunction at baseline.  Consult medicine for admission      FINAL CLINICAL IMPRESSION(S) / ED DIAGNOSES   Final diagnoses:  Symptomatic anemia     Rx / DC Orders   ED Discharge Orders     None        Note:  This document was prepared using Dragon voice recognition software and may include unintentional dictation errors.   Claudene Rover, MD 05/30/24 [phone]  "

## 2024-05-31 DIAGNOSIS — K922 Gastrointestinal hemorrhage, unspecified: Secondary | ICD-10-CM

## 2024-05-31 LAB — COMPREHENSIVE METABOLIC PANEL WITH GFR
ALT: 56 U/L — ABNORMAL HIGH (ref 0–44)
AST: 47 U/L — ABNORMAL HIGH (ref 15–41)
Albumin: 3.7 g/dL (ref 3.5–5.0)
Alkaline Phosphatase: 90 U/L (ref 38–126)
Anion gap: 11 (ref 5–15)
BUN: 38 mg/dL — ABNORMAL HIGH (ref 8–23)
CO2: 24 mmol/L (ref 22–32)
Calcium: 9 mg/dL (ref 8.9–10.3)
Chloride: 100 mmol/L (ref 98–111)
Creatinine, Ser: 2.22 mg/dL — ABNORMAL HIGH (ref 0.61–1.24)
GFR, Estimated: 31 mL/min — ABNORMAL LOW
Glucose, Bld: 120 mg/dL — ABNORMAL HIGH (ref 70–99)
Potassium: 3.8 mmol/L (ref 3.5–5.1)
Sodium: 136 mmol/L (ref 135–145)
Total Bilirubin: 0.4 mg/dL (ref 0.0–1.2)
Total Protein: 6.9 g/dL (ref 6.5–8.1)

## 2024-05-31 LAB — CBC
HCT: 25.4 % — ABNORMAL LOW (ref 39.0–52.0)
Hemoglobin: 7.5 g/dL — ABNORMAL LOW (ref 13.0–17.0)
MCH: 25.5 pg — ABNORMAL LOW (ref 26.0–34.0)
MCHC: 29.5 g/dL — ABNORMAL LOW (ref 30.0–36.0)
MCV: 86.4 fL (ref 80.0–100.0)
Platelets: 225 K/uL (ref 150–400)
RBC: 2.94 MIL/uL — ABNORMAL LOW (ref 4.22–5.81)
RDW: 19.7 % — ABNORMAL HIGH (ref 11.5–15.5)
WBC: 5.5 K/uL (ref 4.0–10.5)
nRBC: 0 % (ref 0.0–0.2)

## 2024-05-31 LAB — HEMOGLOBIN A1C
Hgb A1c MFr Bld: 5.8 % — ABNORMAL HIGH (ref 4.8–5.6)
Mean Plasma Glucose: 119.76 mg/dL

## 2024-05-31 MED ORDER — PEG 3350-KCL-NA BICARB-NACL 420 G PO SOLR
4000.0000 mL | Freq: Once | ORAL | Status: AC
  Administered 2024-05-31: 4000 mL via ORAL
  Filled 2024-05-31: qty 4000

## 2024-05-31 NOTE — Progress Notes (Addendum)
" °   05/31/24 0449  Assess: MEWS Score  Level of Consciousness Alert  Assess: MEWS Score  MEWS Temp 0  MEWS Systolic 0  MEWS Pulse 0  MEWS RR 0  MEWS LOC 0  MEWS Score 0  MEWS Score Color Green  Notify: Charge Nurse/RN  Name of Charge Nurse/RN Notified [facility] RN  Provider Notification  Provider Name/Title Donati-Garmon NP  Date Provider Notified 05/31/24  Time Provider Notified 0430  Method of Notification Page  Notification Reason Medical equipment refusal (Refuses tele.)  Provider response No new orders (put in documentation)  Date of Provider Response 05/31/24  Time of Provider Response [phone]   Patient is alert and oriented x 4 and able to make his needs known. Patient refuses tele box, states all his recent EKG's have been normal. Denies pain or discomfort at this time. Call light and personal items within reach.  NO SCD's available at this time. "

## 2024-05-31 NOTE — Progress Notes (Signed)
 " PROGRESS NOTE    [facility]  FMW:[id number] DOB: Mar 28, 1951 DOA: 05/30/2024 PCP: Bernardo Fend, DO   Assessment & Plan:   Principal Problem:   GI bleed  Assessment and Plan: GI bleed: etiology unclear. Will go for likely EGD and colonoscopy tomorrow as per GI. S/p 1 unit of pRBCs transfused. Continue on PPI   Symptomatic anemia: secondary to above.  Secondary to above. S/p 1 unit of pRBCs transfused so far    DM2: well controlled, HbA1c 5.8. Continue on SSI w/ accuchecks   Hx of CAD: holding home dose of losartan  in setting on GI bleed.   CHF: unknown systolic vs diastolic vs combined. Holding home dose of losartan , torsemide . Monitor I/Os   PAF: continue on home dose of amio. Holding home eliquis    HTN: holding home dose of losartan  in setting of GI bleed   HLD: continue on statin    CKD IIIb: Cr is labile. Avoid nephrotoxic meds    Transaminitis: etiology unclear. Labile. Will continue to monitor    Hypothyroidism: continue on home dose of levothyroxine   Obesity: BMI 31.1. Complicates overall care & prognosis      DVT prophylaxis: SCDs Code Status: full  Family Communication:  Disposition Plan: likely d/c back home   Level of care: Telemetry  Status is: Inpatient Remains inpatient appropriate because: severity of illness    Consultants:  GI   Procedures:   Antimicrobials:   Subjective: Pt c/o fatigue   Objective: Vitals:   05/31/24 0204 05/31/24 0233 05/31/24 0415 05/31/24 0841  BP: (!) 109/49  (!) 112/57 (!) 115/58  Pulse: 68  80 72  Resp: 16  17 18   Temp:  98.2 F (36.8 C) 98 F (36.7 C) 98 F (36.7 C)  TempSrc:  Oral    SpO2: 100%  98% 98%  Weight:   104.2 kg   Height:   6' (1.829 m)     Intake/Output Summary (Last 24 hours) at 05/31/2024 1025 Last data filed at 05/31/2024 0843 Gross per 24 hour  Intake --  Output 1650 ml  Net -1650 ml   Filed Weights   05/30/24 1338 05/31/24 0415  Weight: 97.3 kg 104.2 kg     Examination:  General exam: Appears calm and comfortable  Respiratory system: Clear to auscultation. Respiratory effort normal. Cardiovascular system: S1 & S2+. No rubs, gallops or clicks.  Gastrointestinal system: Abdomen is obese, soft and nontender. Normal bowel sounds heard. Central nervous system: Alert and oriented. Moves all extremities Psychiatry: Judgement and insight appear normal. Mood & affect appropriate.     Data Reviewed: I have personally reviewed following labs and imaging studies  CBC: Recent Labs  Lab 05/30/24 1340 05/31/24 0434  WBC 6.5 5.5  HGB 6.8* 7.5*  HCT 22.8* 25.4*  MCV 88.7 86.4  PLT 232 225   Basic Metabolic Panel: Recent Labs  Lab 05/30/24 1340 05/31/24 0434  NA 135 136  K 3.8 3.8  CL 98 100  CO2 23 24  GLUCOSE 88 120*  BUN 40* 38*  CREATININE 2.17* 2.22*  CALCIUM  8.8* 9.0  MG 2.2  --    GFR: Estimated Creatinine Clearance: 38.1 mL/min (A) (by C-G formula based on SCr of 2.22 mg/dL (H)). Liver Function Tests: Recent Labs  Lab 05/30/24 1340 05/31/24 0434  AST 47* 47*  ALT 49* 56*  ALKPHOS 90 90  BILITOT 0.3 0.4  PROT 7.0 6.9  ALBUMIN 3.7 3.7   No results for input(s): LIPASE, AMYLASE in the  last 168 hours. No results for input(s): AMMONIA in the last 168 hours. Coagulation Profile: No results for input(s): INR, PROTIME in the last 168 hours. Cardiac Enzymes: No results for input(s): CKTOTAL, CKMB, CKMBINDEX, TROPONINI in the last 168 hours. BNP (last 3 results) No results for input(s): PROBNP in the last 8760 hours. HbA1C: Recent Labs    05/30/24 2134  HGBA1C 5.8*   CBG: No results for input(s): GLUCAP in the last 168 hours. Lipid Profile: No results for input(s): CHOL, HDL, LDLCALC, TRIG, CHOLHDL, LDLDIRECT in the last 72 hours. Thyroid  Function Tests: Recent Labs    05/30/24 1340  TSH 5.770*   Anemia Panel: Recent Labs    05/30/24 1340  FERRITIN 76  TIBC 389  IRON  20*   Sepsis Labs: No results for input(s): PROCALCITON, LATICACIDVEN in the last 168 hours.  Recent Results (from the past 240 hours)  Resp panel by RT-PCR (RSV, Flu A&B, Covid) Anterior Nasal Swab     Status: None   Collection Time: 05/30/24  2:45 PM   Specimen: Anterior Nasal Swab  Result Value Ref Range Status   SARS Coronavirus 2 by RT PCR NEGATIVE NEGATIVE Final    Comment: (NOTE) SARS-CoV-2 target nucleic acids are NOT DETECTED.  The SARS-CoV-2 RNA is generally detectable in upper respiratory specimens during the acute phase of infection. The lowest concentration of SARS-CoV-2 viral copies this assay can detect is 138 copies/mL. A negative result does not preclude SARS-Cov-2 infection and should not be used as the sole basis for treatment or other patient management decisions. A negative result may occur with  improper specimen collection/handling, submission of specimen other than nasopharyngeal swab, presence of viral mutation(s) within the areas targeted by this assay, and inadequate number of viral copies(<138 copies/mL). A negative result must be combined with clinical observations, patient history, and epidemiological information. The expected result is Negative.  Fact Sheet for Patients:  bloggercourse.com  Fact Sheet for Healthcare Providers:  seriousbroker.it  This test is no t yet approved or cleared by the [place]  FDA and  has been authorized for detection and/or diagnosis of SARS-CoV-2 by FDA under an Emergency Use Authorization (EUA). This EUA will remain  in effect (meaning this test can be used) for the duration of the COVID-19 declaration under Section 564(b)(1) of the Act, 21 U.S.C.section 360bbb-3(b)(1), unless the authorization is terminated  or revoked sooner.       Influenza A by PCR NEGATIVE NEGATIVE Final   Influenza B by PCR NEGATIVE NEGATIVE Final    Comment: (NOTE) The Xpert  Xpress SARS-CoV-2/FLU/RSV plus assay is intended as an aid in the diagnosis of influenza from Nasopharyngeal swab specimens and should not be used as a sole basis for treatment. Nasal washings and aspirates are unacceptable for Xpert Xpress SARS-CoV-2/FLU/RSV testing.  Fact Sheet for Patients: bloggercourse.com  Fact Sheet for Healthcare Providers: seriousbroker.it  This test is not yet approved or cleared by the [place]  FDA and has been authorized for detection and/or diagnosis of SARS-CoV-2 by FDA under an Emergency Use Authorization (EUA). This EUA will remain in effect (meaning this test can be used) for the duration of the COVID-19 declaration under Section 564(b)(1) of the Act, 21 U.S.C. section 360bbb-3(b)(1), unless the authorization is terminated or revoked.     Resp Syncytial Virus by PCR NEGATIVE NEGATIVE Final    Comment: (NOTE) Fact Sheet for Patients: bloggercourse.com  Fact Sheet for Healthcare Providers: seriousbroker.it  This test is not yet approved or cleared by  the [place]  FDA and has been authorized for detection and/or diagnosis of SARS-CoV-2 by FDA under an Emergency Use Authorization (EUA). This EUA will remain in effect (meaning this test can be used) for the duration of the COVID-19 declaration under Section 564(b)(1) of the Act, 21 U.S.C. section 360bbb-3(b)(1), unless the authorization is terminated or revoked.  Performed at [facility], [address], [place], [place] [address]          Radiology Studies: [facility] Chest [place] 1 View Result Date: 05/30/2024 CLINICAL DATA:  Shortness of breath.  Fatigue. EXAM: PORTABLE CHEST - 1 VIEW COMPARISON:  07/16/2015 FINDINGS: Heart size at upper limits of normal. No pulmonary vascular congestion. Mediastinum within normal limits. Lungs are clear. IMPRESSION: Borderline cardiomegaly.  Electronically Signed   By: Aliene Lloyd M.D.   On: 05/30/2024 15:32        Scheduled Meds:  amiodarone   200 mg Oral Daily   insulin  aspart  0-15 Units Subcutaneous TID WC   insulin  aspart  0-5 Units Subcutaneous QHS   levothyroxine   25 mcg Oral Daily   pantoprazole  (PROTONIX ) IV  40 mg Intravenous Q12H   pantoprazole  (PROTONIX ) IV  40 mg Intravenous Once   rosuvastatin   20 mg Oral Daily   Continuous Infusions:  sodium chloride        LOS: 1 day    Anthony CHRISTELLA Pouch, MD [facility] Pager [phone]  If 7PM-7AM, please contact night-coverage [email or web address] 05/31/2024, 10:25 AM   "

## 2024-05-31 NOTE — Plan of Care (Signed)

## 2024-05-31 NOTE — Progress Notes (Signed)
 Patient refuses blood sugar checks from nurse tech. Prefers to check using his Dexcom on his left arm to tell us  his blood sugar. MD made aware.

## 2024-06-01 ENCOUNTER — Encounter
Admission: EM | Disposition: A | Discharge status: Home / Self Care | Payer: Self-pay | Source: Home / Self Care | Attending: Internal Medicine

## 2024-06-01 ENCOUNTER — Inpatient Hospital Stay: Admitting: Anesthesiology

## 2024-06-01 ENCOUNTER — Encounter: Payer: Self-pay | Admitting: Internal Medicine

## 2024-06-01 DIAGNOSIS — K922 Gastrointestinal hemorrhage, unspecified: Secondary | ICD-10-CM | POA: Diagnosis not present

## 2024-06-01 HISTORY — PX: POLYPECTOMY: SHX149

## 2024-06-01 HISTORY — PX: ESOPHAGOGASTRODUODENOSCOPY: SHX5428

## 2024-06-01 HISTORY — PX: COLONOSCOPY: SHX5424

## 2024-06-01 HISTORY — PX: RECTAL BIOPSY: SHX2303

## 2024-06-01 LAB — CBC
HCT: 22.1 % — ABNORMAL LOW (ref 39.0–52.0)
Hemoglobin: 6.6 g/dL — ABNORMAL LOW (ref 13.0–17.0)
MCH: 26.1 pg (ref 26.0–34.0)
MCHC: 29.9 g/dL — ABNORMAL LOW (ref 30.0–36.0)
MCV: 87.4 fL (ref 80.0–100.0)
Platelets: 212 K/uL (ref 150–400)
RBC: 2.53 MIL/uL — ABNORMAL LOW (ref 4.22–5.81)
RDW: 19.3 % — ABNORMAL HIGH (ref 11.5–15.5)
WBC: 4.5 K/uL (ref 4.0–10.5)
nRBC: 0 % (ref 0.0–0.2)

## 2024-06-01 LAB — PREPARE RBC (CROSSMATCH)

## 2024-06-01 SURGERY — EGD (ESOPHAGOGASTRODUODENOSCOPY)
Anesthesia: General

## 2024-06-01 MED ORDER — SODIUM CHLORIDE 0.9% IV SOLUTION: Freq: Once | INTRAVENOUS | Status: AC

## 2024-06-01 MED ORDER — PROPOFOL 10 MG/ML IV BOLUS
INTRAVENOUS | Status: DC | PRN
  Administered 2024-06-01: 80 mg via INTRAVENOUS

## 2024-06-01 MED ORDER — PROPOFOL 1000 MG/100ML IV EMUL
INTRAVENOUS | Status: AC
  Filled 2024-06-01: qty 100

## 2024-06-01 MED ORDER — PROPOFOL 500 MG/50ML IV EMUL
INTRAVENOUS | Status: DC | PRN
  Administered 2024-06-01: 100 ug/kg/min via INTRAVENOUS

## 2024-06-01 MED ORDER — SPOT INK MARKER SYRINGE KIT
PACK | SUBMUCOSAL | Status: DC | PRN
  Administered 2024-06-01: 5 mL via SUBMUCOSAL

## 2024-06-01 MED ORDER — LIDOCAINE HCL (CARDIAC) PF 100 MG/5ML IV SOSY
PREFILLED_SYRINGE | INTRAVENOUS | Status: DC | PRN
  Administered 2024-06-01: 100 mg via INTRAVENOUS

## 2024-06-01 MED ORDER — SODIUM CHLORIDE 0.9 % IV SOLN: INTRAVENOUS | Status: DC

## 2024-06-01 NOTE — Interval H&P Note (Signed)
 History and Physical Interval Note: Consult note from 06/01/2024 was reviewed. Pt's hgb this morning was 6.6 so another unit of blood was administered prior to the procedure. There was no other interval change after seeing and examining the patient.  Written consent was obtained from the patient after discussion of risks, benefits, and alternatives. Patient has consented to proceed with Esophagogastroduodenoscopy and Colonoscopy with possible intervention   06/01/2024 4:18 PM  [facility]  has presented today for surgery, with the diagnosis of IDA.  The various methods of treatment have been discussed with the patient and family. After consideration of risks, benefits and other options for treatment, the patient has consented to  Procedures: EGD (ESOPHAGOGASTRODUODENOSCOPY) (N/A) COLONOSCOPY (N/A) as a surgical intervention.  The patient's history has been reviewed, patient examined, no change in status, stable for surgery.  I have reviewed the patient's chart and labs.  Questions were answered to the patient's satisfaction.     [name]

## 2024-06-01 NOTE — Transfer of Care (Signed)
 Immediate Anesthesia Transfer of Care Note  Patient: [name]  Procedure(s) Performed: EGD (ESOPHAGOGASTRODUODENOSCOPY) COLONOSCOPY POLYPECTOMY, INTESTINE BIOPSY, duodenum gastric and esophagus  Patient Location: PACU  Anesthesia Type:General  Level of Consciousness: awake  Airway & Oxygen Therapy: Patient Spontanous Breathing  Post-op Assessment: Report given to RN  Post vital signs: Reviewed and stable  Last Vitals:  Vitals Value Taken Time  BP 92/54 06/01/24 17:30  Temp    Pulse 59 06/01/24 17:31  Resp 9 06/01/24 17:31  SpO2 97 % 06/01/24 17:31  Vitals shown include unfiled device data.  Last Pain:  Vitals:   06/01/24 1605  TempSrc: Oral  PainSc:          Complications: No notable events documented.

## 2024-06-01 NOTE — Anesthesia Preprocedure Evaluation (Addendum)
 "                                  Anesthesia Evaluation  Patient identified by MRN, date of birth, ID band Patient awake    Reviewed: Allergy & Precautions, H&P , NPO status , Patient's Chart, lab work & pertinent test results  Airway Mallampati: II  TM Distance: >3 FB Neck ROM: full    Dental no notable dental hx.    Pulmonary shortness of breath and with exertion, sleep apnea , Patient abstained from smoking.   Pulmonary exam normal        Cardiovascular hypertension, pulmonary hypertension+ CAD, + Cardiac Stents, + Peripheral Vascular Disease and +CHF  + dysrhythmias (Rhythm control w/ amiodarone ) Atrial Fibrillation   02/2021 Echo: EF 40-45%, glob HK/signif sept HK. Nl RV fxn.  Cath 2022: 1. Moderate noncritical coronary artery disease involving multiple vessels, similar to the prior catheterization from 2017. 2. Severely elevated left heart, right heart, and pulmonary artery pressures. 3. Mildly reduced Fick cardiac output/index.    Neuro/Psych negative neurological ROS  negative psych ROS   GI/Hepatic negative GI ROS, Neg liver ROS,,,  Endo/Other  diabetes, Type 2Hypothyroidism    Renal/GU Renal InsufficiencyRenal diseaseS/p left nephrectomy   negative genitourinary   Musculoskeletal   Abdominal   Peds  Hematology  (+) Blood dyscrasia, anemia   Anesthesia Other Findings Troponemia stable  GIB: S/p 1 unit of pRBCs today for Hbh 6.6. Pt asymptomatic.   Past Medical History: [id number]: Allergy No date: Anxiety     Comment:  Mild - Intermiteant No date: Cataract     Comment:  early stage No date: CHF (congestive heart failure) (HCC) No date: CKD (chronic kidney disease) stage 3, GFR 30-59 ml/min (HCC) No date: Coronary artery disease     Comment:  a. 1998 s/p ACS Multi-link BMS to the prox LAD [place] b. 07/2015 Cath: LM nl, LAD patent stent,              LCX min irregs, OM1/2 min irregs, OM3 nl, RCA min irregs;               c. 07/2020 Cath: LM nl, LAD 50p/m, 70d, D2 70, LCX               40p/20m, OM3 40, RCA 60d->Med Rx. No date: Depression     Comment:  Intermiteant No date: Diabetes mellitus type 2, uncontrolled     Comment:  a. A1c 11.0 in 07/2015. No date: Glaucoma     Comment:  Resolved with laser No date: HFrEF     Comment:  a. EF 25% by cath in 2013; b. Echo in 07/2015 showing EF              of 15-20%, moderate MR, moderate Pulm HTN, severely               dilated IVC; c. 11/2016 Echo: EF 55-60%, no rwma, Gr1 DD,               mildly dil LA, nl RV fxn. d. 12/2019 Echo: EF 40-45%, e.               07/2020 Echo: EF 20-25%, sev red RV fxx; f. 02/2021 Echo:  EF 40-45%, glob HK/signif sept HK. Nl RV fxn. No date: History of kidney stones No date: Hypertension 08/15/2023: Hypothyroidism No date: Ischemic cardiomyopathy No date: Kidney stones     Comment:  Left No date: Myocardial infarction [facility]) No date: Paroxysmal atrial fibrillation (HCC)     Comment:  a. Dx 07/2015; b. CHA2DS2VASc = 5-->eliquis . Rhythm               control w/ amiodarone . 09/21/2015: Pollen allergies     Comment:  pt called and stated that he woke up and had some               drainage-pt states he did not see the color of the               drainage-pt denies running a fever and this only happened              once-pt instructed to call Dr Geneva office if he               starts running a fever or if the color of drainage               becomes yellow/green No date: Psoriasis No date: Pulmonary hypertension (HCC) No date: Sleep apnea  Past Surgical History: 07/25/2015: CARDIAC CATHETERIZATION; N/A     Comment:  Procedure: Right/Left Heart Cath and Coronary               Angiography;  Surgeon: Deatrice DELENA Cage, MD;  Location:               [facility] INVASIVE [facility];  Service: Cardiovascular;                Laterality: N/A; No date: CARDIAC CATHETERIZATION     Comment[place]  [place] No date: CARDIAC  CATHETERIZATION     Comment:  [place] No date: CORONARY ANGIOPLASTY WITH STENT PLACEMENT 09/07/2015: CYSTOSCOPY WITH STENT PLACEMENT; Left     Comment:  Procedure: CYSTOSCOPY WITH STENT PLACEMENT;  Surgeon:               Rosina Riis, MD;  Location: [facility] ORS;  Service:               Urology;  Laterality: Left; 10/25/2015: CYSTOSCOPY/URETEROSCOPY/HOLMIUM LASER/STENT PLACEMENT;  Left     Comment:  Procedure: CYSTOSCOPY/URETEROSCOPY/HOLMIUM LASER/STENT               EXCHANGE;  Surgeon: Rosina Riis, MD;  Location: [facility]               ORS;  Service: Urology;  Laterality: Left; 05/11/2019: CYSTOSCOPY/URETEROSCOPY/HOLMIUM LASER/STENT PLACEMENT;  Left     Comment:  Procedure: CYSTOSCOPY/URETEROSCOPY/HOLMIUM LASER/STENT               PLACEMENT;  Surgeon: Riis Rosina, MD;  Location: [facility]              ORS;  Service: Urology;  Laterality: Left; 04/02/2019: EXTRACORPOREAL SHOCK WAVE LITHOTRIPSY; Left     Comment:  Procedure: EXTRACORPOREAL SHOCK WAVE LITHOTRIPSY (ESWL);              Surgeon: Riis Rosina, MD;  Location: [facility] ORS;                Service: Urology;  Laterality: Left; 03/05/2019: EXTRACORPOREAL SHOCK WAVE LITHOTRIPSY; Left     Comment:  Procedure: EXTRACORPOREAL SHOCK WAVE LITHOTRIPSY (ESWL);              Surgeon: Twylla,  Glendia BROCKS, MD;  Location: [facility] ORS;                Service: Urology;  Laterality: Left; No date: EYE SURGERY; Bilateral     Comment:  LASER FOR GLAUCOMA 09/30/2020: IRRIGATION AND DEBRIDEMENT FOOT; Left     Comment:  Procedure: IRRIGATION AND DEBRIDEMENT FOOT;  Surgeon:               Silva Juliene SAUNDERS, DPM;  Location: [facility] ORS;  Service:               Podiatry;  Laterality: Left; No date: KIDNEY SURGERY 10/04/2020: LOWER EXTREMITY ANGIOGRAPHY; Left     Comment:  Procedure: Lower Extremity Angiography;  Surgeon:               Jama Cordella MATSU, MD;  Location: [facility];               Service: Cardiovascular;  Laterality: Left; No  date: NEPHRECTOMY; Left 11/2019: NEPHROSTOMY TUBE PLACEMENT ([facility] HX); Left 07/12/2020: RIGHT/LEFT HEART CATH AND CORONARY ANGIOGRAPHY; N/A     Comment:  Procedure: RIGHT/LEFT HEART CATH AND CORONARY               ANGIOGRAPHY;  Surgeon: Mady Bruckner, MD;  Location:               [facility];  Service: Cardiovascular;                Laterality: N/A; 12/10/2019: ROBOT ASSISTED LAPAROSCOPIC NEPHRECTOMY; Left     Comment:  Procedure: XI ROBOTIC ASSISTED LAPAROSCOPIC NEPHRECTOMY;              Surgeon: Penne Knee, MD;  Location: [facility] ORS;                Service: Urology;  Laterality: Left; 09/07/2015: URETEROSCOPY WITH HOLMIUM LASER LITHOTRIPSY; Left     Comment:  Procedure: URETEROSCOPY WITH HOLMIUM LASER LITHOTRIPSY;               Surgeon: Knee Penne, MD;  Location: [facility] ORS;                Service: Urology;  Laterality: Left;  BMI    Body Mass Index: 31.16 kg/m      Reproductive/Obstetrics negative OB ROS                              Anesthesia Physical Anesthesia Plan  ASA: 3  Anesthesia Plan: General   Post-op Pain Management:    Induction:   PONV Risk Score and Plan: Propofol  infusion and TIVA  Airway Management Planned: Natural Airway and Simple Face Mask  Additional Equipment:   Intra-op Plan:   Post-operative Plan:   Informed Consent: I have reviewed the patients History and Physical, chart, labs and discussed the procedure including the risks, benefits and alternatives for the proposed anesthesia with the patient or authorized representative who has indicated his/her understanding and acceptance.     Dental Advisory Given  Plan Discussed with: CRNA and Surgeon  Anesthesia Plan Comments:          Anesthesia Quick Evaluation  "

## 2024-06-01 NOTE — Progress Notes (Signed)
 " PROGRESS NOTE    [name] [facility]  FMW:[id number] DOB: 1951-05-16 DOA: 05/30/2024 PCP: Bernardo Fend, DO   Assessment & Plan:   Principal Problem:   GI bleed  Assessment and Plan: GI bleed: etiology unclear. Will go for EGD & colonoscopy today. Will transfuse 1 unit more of pRBCs as Hb dropped to 6.6. S/p 1 unit of pRBCs transfused. Continue on PPI   Symptomatic anemia: secondary to above. Will transfuse 1 unit more of pRBCs as Hb dropped to 6.6. S/p 1 unit of pRBCs transfused so far    DM2: well controlled, HbA1c 5.8. Diet controlled  Hx of CAD: holding home dose of losartan  in setting of GI bleed  CHF: unknown systolic vs diastolic vs combined. Holding home dose of torsemide , lasix . Monitor I/Os   PAF: continue on home dose of amio. Holding home eliquis    HTN: holding home dose of losartan  in setting of GI bleed   HLD: continue on statin     CKD IIIb: Cr is trending up from day prior. Avoid nephrotoxic meds    Transaminitis: etiology unclear. Labile. Will continue to monitor    Hypothyroidism: continue on home dose of levothyroxine    Obesity: BMI 31.1. Complicates overall care & prognosis      DVT prophylaxis: SCDs Code Status: full  Family Communication:  Disposition Plan: likely d/c back home   Level of care: Telemetry  Status is: Inpatient Remains inpatient appropriate because: severity of illness, requiring 1 unit of pRBCs again today     Consultants:  GI   Procedures:   Antimicrobials:   Subjective: Pt c/o being hungry and angry.   Objective: Vitals:   06/01/24 1154 06/01/24 1304 06/01/24 1325 06/01/24 1605  BP: 114/61 120/60 125/64 120/79  Pulse: 70 69 70 66  Resp: 16 18 18 18   Temp: (!) 97 F (36.1 C) 97.8 F (36.6 C) 97.8 F (36.6 C) 97.6 F (36.4 C)  TempSrc: Temporal Oral Oral Oral  SpO2: 98% 100% 96% 99%  Weight:      Height:        Intake/Output Summary (Last 24 hours) at 06/01/2024 1610 Last data filed at 06/01/2024  1605 Gross per 24 hour  Intake 840 ml  Output 350 ml  Net 490 ml   Filed Weights   05/30/24 1338 05/31/24 0415  Weight: 97.3 kg 104.2 kg    Examination:  General exam: appears frustrated Respiratory system: clear breath sounds b/l  Cardiovascular system: S1/S2+. No rubs or clicks  Gastrointestinal system: Abd is soft, NT, obese & hypoactive bowel sounds  Central nervous system: alert & oriented. Moves all extremities Psychiatry: judgement and insight appears at baseline. Frustrated mood and affect    Data Reviewed: I have personally reviewed following labs and imaging studies  CBC: Recent Labs  Lab 05/30/24 1340 05/31/24 0434 06/01/24 1114  WBC 6.5 5.5 4.5  HGB 6.8* 7.5* 6.6*  HCT 22.8* 25.4* 22.1*  MCV 88.7 86.4 87.4  PLT 232 225 212   Basic Metabolic Panel: Recent Labs  Lab 05/30/24 1340 05/31/24 0434  NA 135 136  K 3.8 3.8  CL 98 100  CO2 23 24  GLUCOSE 88 120*  BUN 40* 38*  CREATININE 2.17* 2.22*  CALCIUM  8.8* 9.0  MG 2.2  --    GFR: Estimated Creatinine Clearance: 38.1 mL/min (A) (by C-G formula based on SCr of 2.22 mg/dL (H)). Liver Function Tests: Recent Labs  Lab 05/30/24 1340 05/31/24 0434  AST 47* 47*  ALT 49*  56*  ALKPHOS 90 90  BILITOT 0.3 0.4  PROT 7.0 6.9  ALBUMIN 3.7 3.7   No results for input(s): LIPASE, AMYLASE in the last 168 hours. No results for input(s): AMMONIA in the last 168 hours. Coagulation Profile: No results for input(s): INR, PROTIME in the last 168 hours. Cardiac Enzymes: No results for input(s): CKTOTAL, CKMB, CKMBINDEX, TROPONINI in the last 168 hours. BNP (last 3 results) No results for input(s): PROBNP in the last 8760 hours. HbA1C: Recent Labs    05/30/24 2134  HGBA1C 5.8*   CBG: No results for input(s): GLUCAP in the last 168 hours. Lipid Profile: No results for input(s): CHOL, HDL, LDLCALC, TRIG, CHOLHDL, LDLDIRECT in the last 72 hours. Thyroid  Function  Tests: Recent Labs    05/30/24 1340  TSH 5.770*   Anemia Panel: Recent Labs    05/30/24 1340  FERRITIN 76  TIBC 389  IRON 20*   Sepsis Labs: No results for input(s): PROCALCITON, LATICACIDVEN in the last 168 hours.  Recent Results (from the past 240 hours)  Resp panel by RT-PCR (RSV, Flu A&B, Covid) Anterior Nasal Swab     Status: None   Collection Time: 05/30/24  2:45 PM   Specimen: Anterior Nasal Swab  Result Value Ref Range Status   SARS Coronavirus 2 by RT PCR NEGATIVE NEGATIVE Final    Comment: (NOTE) SARS-CoV-2 target nucleic acids are NOT DETECTED.  The SARS-CoV-2 RNA is generally detectable in upper respiratory specimens during the acute phase of infection. The lowest concentration of SARS-CoV-2 viral copies this assay can detect is 138 copies/mL. A negative result does not preclude SARS-Cov-2 infection and should not be used as the sole basis for treatment or other patient management decisions. A negative result may occur with  improper specimen collection/handling, submission of specimen other than nasopharyngeal swab, presence of viral mutation(s) within the areas targeted by this assay, and inadequate number of viral copies(<138 copies/mL). A negative result must be combined with clinical observations, patient history, and epidemiological information. The expected result is Negative.  Fact Sheet for Patients:  bloggercourse.com  Fact Sheet for Healthcare Providers:  seriousbroker.it  This test is no t yet approved or cleared by the [place]  FDA and  has been authorized for detection and/or diagnosis of SARS-CoV-2 by FDA under an Emergency Use Authorization (EUA). This EUA will remain  in effect (meaning this test can be used) for the duration of the COVID-19 declaration under Section 564(b)(1) of the Act, 21 U.S.C.section 360bbb-3(b)(1), unless the authorization is terminated  or revoked sooner.        Influenza A by PCR NEGATIVE NEGATIVE Final   Influenza B by PCR NEGATIVE NEGATIVE Final    Comment: (NOTE) The Xpert Xpress SARS-CoV-2/FLU/RSV plus assay is intended as an aid in the diagnosis of influenza from Nasopharyngeal swab specimens and should not be used as a sole basis for treatment. Nasal washings and aspirates are unacceptable for Xpert Xpress SARS-CoV-2/FLU/RSV testing.  Fact Sheet for Patients: bloggercourse.com  Fact Sheet for Healthcare Providers: seriousbroker.it  This test is not yet approved or cleared by the [place]  FDA and has been authorized for detection and/or diagnosis of SARS-CoV-2 by FDA under an Emergency Use Authorization (EUA). This EUA will remain in effect (meaning this test can be used) for the duration of the COVID-19 declaration under Section 564(b)(1) of the Act, 21 U.S.C. section 360bbb-3(b)(1), unless the authorization is terminated or revoked.     Resp Syncytial Virus by PCR NEGATIVE NEGATIVE Final  Comment: (NOTE) Fact Sheet for Patients: bloggercourse.com  Fact Sheet for Healthcare Providers: seriousbroker.it  This test is not yet approved or cleared by the [place]  FDA and has been authorized for detection and/or diagnosis of SARS-CoV-2 by FDA under an Emergency Use Authorization (EUA). This EUA will remain in effect (meaning this test can be used) for the duration of the COVID-19 declaration under Section 564(b)(1) of the Act, 21 U.S.C. section 360bbb-3(b)(1), unless the authorization is terminated or revoked.  Performed at [facility], [address], [place], [place] [address]          Radiology Studies: No results found.       Scheduled Meds:  amiodarone   200 mg Oral Daily   levothyroxine   25 mcg Oral Daily   pantoprazole  (PROTONIX ) IV  40 mg Intravenous Q12H   pantoprazole   (PROTONIX ) IV  40 mg Intravenous Once   rosuvastatin   20 mg Oral Daily   Continuous Infusions:  sodium chloride        LOS: 2 days    Anthony CHRISTELLA Pouch, MD [facility] Pager [phone]  If 7PM-7AM, please contact night-coverage [email or web address] 06/01/2024, 4:10 PM   "

## 2024-06-01 NOTE — Op Note (Addendum)
 [facility] Gastroenterology Patient Name: [name] Procedure Date: 06/01/2024 12:16 PM MRN: [id number] Account #: [id number] Date of Birth: February 10, 1951 Admit Type: Inpatient Age: 73 [facility] END[facility] ROOM 2 Gender: Male Note Status: Finalized Instrument Name: Endoscope [id number] Procedure:             Upper GI endoscopy Indications:           iron deficiency anemia Providers:             Elspeth Ozell Onita ROSALEA, DO Referring MD:          Sharyle Fischer (Referring MD) Medicines:             Monitored Anesthesia Care Complications:         No immediate complications. Estimated blood loss:                         Minimal. Procedure:             Pre-Anesthesia Assessment:                        - Prior to the procedure, a History and Physical was                         performed, and patient medications and allergies were                         reviewed. The patient is competent. The risks and                         benefits of the procedure and the sedation options and                         risks were discussed with the patient. All questions                         were answered and informed consent was obtained.                         Patient identification and proposed procedure were                         verified by the physician, the nurse, the anesthetist                         and the technician in the endoscopy suite. Mental                         Status Examination: alert and oriented. Airway                         Examination: normal oropharyngeal airway and neck                         mobility. Respiratory Examination: clear to                         auscultation. CV Examination: RRR, no murmurs, no S3  or S4. Prophylactic Antibiotics: The patient does not                         require prophylactic antibiotics. Prior                         Anticoagulants: The patient has taken Eliquis                           (apixaban ), last dose was 2 days prior to procedure.                         ASA Grade Assessment: III - A patient with severe                         systemic disease. After reviewing the risks and                         benefits, the patient was deemed in satisfactory                         condition to undergo the procedure. The anesthesia                         plan was to use monitored anesthesia care (MAC).                         Immediately prior to administration of medications,                         the patient was re-assessed for adequacy to receive                         sedatives. The heart rate, respiratory rate, oxygen                         saturations, blood pressure, adequacy of pulmonary                         ventilation, and response to care were monitored                         throughout the procedure. The physical status of the                         patient was re-assessed after the procedure.                        After obtaining informed consent, the endoscope was                         passed under direct vision. Throughout the procedure,                         the patient's blood pressure, pulse, and oxygen                         saturations were monitored continuously. The Endoscope  was introduced through the mouth, and advanced to the                         third part of duodenum. The upper GI endoscopy was                         accomplished without difficulty. The patient tolerated                         the procedure well. Findings:      The duodenal bulb, first portion of the duodenum, second portion of the       duodenum and third portion of the duodenum were normal. Biopsies for       histology were taken with a cold forceps for evaluation of celiac       disease. Estimated blood loss was minimal.      The entire examined stomach was normal. Biopsies were taken with a cold       forceps for Helicobacter pylori  testing. Estimated blood loss was       minimal.      Esophagogastric landmarks were identified: the gastroesophageal junction       was found at 42 cm from the incisors.      The Z-line was irregular. Biopsies were taken with a cold forceps for       histology. Estimated blood loss was minimal.      The exam of the esophagus was otherwise normal. Impression:            - Normal duodenal bulb, first portion of the duodenum,                         second portion of the duodenum and third portion of                         the duodenum. Biopsied.                        - Normal stomach. Biopsied.                        - Esophagogastric landmarks identified.                        - Z-line irregular. Biopsied. Recommendation:        - Patient has a contact number available for                         emergencies. The signs and symptoms of potential                         delayed complications were discussed with the patient.                         Return to normal activities tomorrow. Written                         discharge instructions were provided to the patient.                        - Return patient to hospital  ward for ongoing care.                        - Resume previous diet.                        - Continue present medications.                        - Await pathology results.                        - Return to GI clinic at appointment to be scheduled.                        - The findings and recommendations were discussed with                         the patient.                        - The findings and recommendations were discussed with                         the referring physician. Procedure Code(s):     --- Professional ---                        [phone], Esophagogastroduodenoscopy, flexible,                         transoral; with biopsy, single or multiple Diagnosis Code(s):     --- Professional ---                        K22.89, Other specified disease of  esophagus CPT copyright 2022 American Medical Association. All rights reserved. The codes documented in this report are preliminary and upon coder review may  be revised to meet current compliance requirements. Attending Participation:      I personally performed the entire procedure. Elspeth Jungling, DO [name] DO, DO 06/01/2024 4:39:08 PM This report has been signed electronically. Number of Addenda: 0 Note Initiated On: 06/01/2024 12:16 PM Estimated Blood Loss:  Estimated blood loss was minimal.      [facility]

## 2024-06-01 NOTE — Anesthesia Postprocedure Evaluation (Signed)
"   Anesthesia Post Note  Patient: Trevone Prestwood [facility]  Procedure(s) Performed: EGD (ESOPHAGOGASTRODUODENOSCOPY) COLONOSCOPY POLYPECTOMY, INTESTINE BIOPSY, duodenum gastric and esophagus  Patient location during evaluation: Endoscopy Anesthesia Type: General Level of consciousness: awake and alert Pain management: pain level controlled Vital Signs Assessment: post-procedure vital signs reviewed and stable Respiratory status: spontaneous breathing, nonlabored ventilation and respiratory function stable Cardiovascular status: blood pressure returned to baseline and stable Postop Assessment: no apparent nausea or vomiting Anesthetic complications: no   No notable events documented.   Last Vitals:  Vitals:   06/01/24 1739 06/01/24 1749  BP: (!) 95/54 104/63  Pulse: (!) 59 62  Resp: 15 18  Temp:    SpO2: 98% 99%    Last Pain:  Vitals:   06/01/24 1749  TempSrc:   PainSc: 0-No pain                 Camellia Merilee Louder      "

## 2024-06-01 NOTE — Plan of Care (Signed)

## 2024-06-01 NOTE — Telephone Encounter (Signed)
Pt has been admitted into the hospital.   

## 2024-06-01 NOTE — Progress Notes (Signed)
 Brief GI Post op note See reports for full details EGD normal- bx taken; irregular z line appreciated  CSY- difficult 2/2 looping/body habitus/redundancy No fresh or old blood appreciated. Multiple polyps 12 were resected, multiple others remain including two larger polyps that appeared to cross over fold.  Will make outpatient referral to advanced endoscopy.  Restart eliquis  in 2 days. Avoid aspirin  and NSAIDs (ibuprofen, Motrin, Advil, naproxen, Aleve, Naprosyn, Goody's powder, & BC powder). For 5 days Will expect some blood in the stool given the number of polyps removed today.  OK to resume previously tolerated diet.  D/w Hospitalist and patient.  GI to sign off. Available as needed. Please do not hesitate to call regarding questions or concerns.  Jim EMERSON Jungling, DO [facility] Gastroenterology

## 2024-06-01 NOTE — Plan of Care (Signed)
  Problem: Coping: Goal: Ability to adjust to condition or change in health will improve Outcome: Progressing   Problem: Pain Managment: Goal: General experience of comfort will improve and/or be controlled Outcome: Progressing   Problem: Safety: Goal: Ability to remain free from injury will improve Outcome: Progressing   Problem: Skin Integrity: Goal: Risk for impaired skin integrity will decrease Outcome: Progressing

## 2024-06-01 NOTE — Progress Notes (Signed)
" ° ° °  PROCEDURAL EXPEDITER PROGRESS NOTE  Patient Name: [name]  DOB:02-Apr-1951 Date of Admission: 05/30/2024  Date of Assessment:06/01/2024   -------------------------------------------------------------------------------------------------------------------   Brief clinical summary: pt scheduled for EGD/ colonoscopy for today.  Pt has a Hx of ischemic cardiomyopathy, CAD, A-Fib on eliquis , CkD pest left nephrectomy type 2 DM, OSA and SOBwith exertion.   Orders in place:  Yes   Communication with surgical team if no orders: n/a  Labs, test, and orders reviewed: Y  Requires surgical clearance:  No  What type of clearance: n/a  Clearance received: n/a  Barriers noted:n/a   Intervention provided by [facility] team: n/a  Barrier resolved:  not applicable   -------------------------------------------------------------------------------------------------------------------  [employer], Ronal DELENA Bald Please contact us  directly via secure chat (search for [facility]) or by calling us  at [phone] [facility]).  "

## 2024-06-01 NOTE — Op Note (Addendum)
 [facility] Patient Name: [name] Procedure Date: 06/01/2024 12:14 PM MRN: [id number] Account #: [id number] Date of Birth: [birth year] Admit Type: Inpatient Age: 73 Room: [facility] 2 Gender: Male Note Status: Finalized Instrument Name: Colon Scope [id number] Procedure:             Colonoscopy Indications:           Iron deficiency anemia Providers:             Elspeth Ozell Onita ROSALEA, DO Referring MD:          Sharyle Fischer (Referring MD) Medicines:             Monitored Anesthesia Care Complications:         No immediate complications. Estimated blood loss:                         Minimal. Procedure:             Pre-Anesthesia Assessment:                        - Prior to the procedure, a History and Physical was                         performed, and patient medications and allergies were                         reviewed. The patient is competent. The risks and                         benefits of the procedure and the sedation options and                         risks were discussed with the patient. All questions                         were answered and informed consent was obtained.                         Patient identification and proposed procedure were                         verified by the physician, the nurse, the anesthetist                         and the technician in the endoscopy suite. Mental                         Status Examination: alert and oriented. Airway                         Examination: normal oropharyngeal airway and neck                         mobility. Respiratory Examination: clear to                         auscultation. CV Examination: RRR, no murmurs, no S3  or S4. Prophylactic Antibiotics: The patient does not                         require prophylactic antibiotics. Prior                         Anticoagulants: The patient has taken Eliquis                           (apixaban ), last dose was 2 days prior to procedure.                         ASA Grade Assessment: III - A patient with severe                         systemic disease. After reviewing the risks and                         benefits, the patient was deemed in satisfactory                         condition to undergo the procedure. The anesthesia                         plan was to use monitored anesthesia care (MAC).                         Immediately prior to administration of medications,                         the patient was re-assessed for adequacy to receive                         sedatives. The heart rate, respiratory rate, oxygen                         saturations, blood pressure, adequacy of pulmonary                         ventilation, and response to care were monitored                         throughout the procedure. The physical status of the                         patient was re-assessed after the procedure.                        After obtaining informed consent, the colonoscope was                         passed under direct vision. Throughout the procedure,                         the patient's blood pressure, pulse, and oxygen                         saturations were monitored continuously. The  Colonoscope was introduced through the anus and                         advanced to the the cecum, identified by the                         appendiceal orifice, ileocecal valve and palpation.                         The colonoscopy was performed with moderate difficulty                         due to a redundant colon, significant looping and the                         patient's body habitus. Successful completion of the                         procedure was aided by changing the patient to a prone                         position, straightening and shortening the scope to                         obtain bowel loop reduction, using scope torsion,                          applying abdominal pressure and lavage. The patient                         tolerated the procedure well. The quality of the bowel                         preparation was evaluated using the BBPS [facility] Bowel                         Preparation Scale) with scores of: Right Colon = 2                         (minor amount of residual staining, small fragments of                         stool and/or opaque liquid, but mucosa seen well),                         Transverse Colon = 2 (minor amount of residual                         staining, small fragments of stool and/or opaque                         liquid, but mucosa seen well) and Left Colon = 2                         (minor amount of residual staining, small fragments of  stool and/or opaque liquid, but mucosa seen well). The                         total BBPS score equals 6. Fair Prep. The ileocecal                         valve, appendiceal orifice, and rectum were                         photographed. Findings:      The perianal and digital rectal examinations were normal. Pertinent       negatives include normal sphincter tone.      Scattered small-mouthed diverticula were found in the entire colon.       Estimated blood loss: none.      11 sessile polyps were found in the rectum, transverse colon and       ascending colon. The polyps were 3 to 10 mm in size. These polyps were       removed with a cold snare. Resection and retrieval were complete.       Estimated blood loss was minimal.      A 12 to 15 mm polyp was found in the cecum. The polyp was sessile. The       polyp was removed with a hot snare. Resection and retrieval were       complete. Estimated blood loss was minimal.      Multiple sessile polyps were found in the entire colon. These were       appreciated throughout the colon. Given constraints of exam and numerous       polyps and bleeding risk, further polypectomy was  deferred to repeat       date.      Two of these polyps were larger and appeared to cross over at least 2       folds. Tattoos were placed on the opposite wall and just distal to the       polyp respectively.      Given the polyp size/spread, difficulty with his body habitus and       previously formed loop, it was difficult to hold position to attempt       this polypectomy.      Recommend to undergo repeat colonoscopy with advanced endoscopy to at       least have these larger two polyps removed and potentially the other       smaller polyps given the number.      The exam was otherwise without abnormality on direct and retroflexion       views. Impression:            - Diverticulosis in the entire examined colon.                        - 11 3 to 10 mm polyps in the rectum, in the                         transverse colon and in the ascending colon, removed                         with a cold snare. Resected and retrieved.                        -  One 12 to 15 mm polyp in the cecum, removed with a                         hot snare. Resected and retrieved.                        - Multiple polyps in the entire colon.                        - The examination was otherwise normal on direct and                         retroflexion views. Recommendation:        - Patient has a contact number available for                         emergencies. The signs and symptoms of potential                         delayed complications were discussed with the patient.                         Return to normal activities tomorrow. Written                         discharge instructions were provided to the patient.                        - Return patient to hospital ward for ongoing care.                        - Resume previous diet.                        - Continue present medications.                        - No aspirin , ibuprofen, naproxen, or other                         non-steroidal  anti-inflammatory drugs for 5 days after                         polyp removal.                        - Resume Eliquis  (apixaban ) at prior dose in 2 days.                         Refer to managing physician for further adjustment of                         therapy.                        - Refer to Advanced Endoscopist at appointment to be                         scheduled.                        -  Await pathology results.                        - The findings and recommendations were discussed with                         the patient.                        - The findings and recommendations were discussed with                         the referring physician. Procedure Code(s):     --- Professional ---                        [phone], Colonoscopy, flexible; with removal of                         tumor(s), polyp(s), or other lesion(s) by snare                         technique Diagnosis Code(s):     --- Professional ---                        D12.8, Benign neoplasm of rectum                        D12.3, Benign neoplasm of transverse colon (hepatic                         flexure or splenic flexure)                        D12.2, Benign neoplasm of ascending colon                        D12.0, Benign neoplasm of cecum                        K57.30, Diverticulosis of large intestine without                         perforation or abscess without bleeding CPT copyright 2022 [employer]. All rights reserved. The codes documented in this report are preliminary and upon coder review may  be revised to meet current compliance requirements. Attending Participation:      I personally performed the entire procedure. Elspeth Jungling, DO [name] DO, DO 06/01/2024 5:39:55 PM This report has been signed electronically. Number of Addenda: 0 Note Initiated On: 06/01/2024 12:14 PM Scope Withdrawal Time: 0 hours 22 minutes 0 seconds  Total Procedure Duration: 0 hours 44  minutes 40 seconds  Estimated Blood Loss:  Estimated blood loss was minimal.      [facility]

## 2024-06-02 ENCOUNTER — Encounter: Payer: Self-pay | Admitting: Gastroenterology

## 2024-06-02 DIAGNOSIS — K922 Gastrointestinal hemorrhage, unspecified: Secondary | ICD-10-CM | POA: Diagnosis not present

## 2024-06-02 LAB — COMPREHENSIVE METABOLIC PANEL WITH GFR
ALT: 45 U/L — ABNORMAL HIGH (ref 0–44)
AST: 27 U/L (ref 15–41)
Albumin: 3.4 g/dL — ABNORMAL LOW (ref 3.5–5.0)
Alkaline Phosphatase: 83 U/L (ref 38–126)
Anion gap: 9 (ref 5–15)
BUN: 29 mg/dL — ABNORMAL HIGH (ref 8–23)
CO2: 24 mmol/L (ref 22–32)
Calcium: 9 mg/dL (ref 8.9–10.3)
Chloride: 103 mmol/L (ref 98–111)
Creatinine, Ser: 1.94 mg/dL — ABNORMAL HIGH (ref 0.61–1.24)
GFR, Estimated: 36 mL/min — ABNORMAL LOW
Glucose, Bld: 88 mg/dL (ref 70–99)
Potassium: 3.7 mmol/L (ref 3.5–5.1)
Sodium: 136 mmol/L (ref 135–145)
Total Bilirubin: 0.5 mg/dL (ref 0.0–1.2)
Total Protein: 6.4 g/dL — ABNORMAL LOW (ref 6.5–8.1)

## 2024-06-02 LAB — CBC
HCT: 26 % — ABNORMAL LOW (ref 39.0–52.0)
Hemoglobin: 8 g/dL — ABNORMAL LOW (ref 13.0–17.0)
MCH: 26.5 pg (ref 26.0–34.0)
MCHC: 30.8 g/dL (ref 30.0–36.0)
MCV: 86.1 fL (ref 80.0–100.0)
Platelets: 216 K/uL (ref 150–400)
RBC: 3.02 MIL/uL — ABNORMAL LOW (ref 4.22–5.81)
RDW: 18.6 % — ABNORMAL HIGH (ref 11.5–15.5)
WBC: 6.3 K/uL (ref 4.0–10.5)
nRBC: 0 % (ref 0.0–0.2)

## 2024-06-02 NOTE — Discharge Summary (Addendum)
 Physician Discharge Summary  [name] FMW:[id number] DOB: 01/24/1951 DOA: 05/30/2024  PCP: Jim Fend, DO  Admit date: 05/30/2024 Discharge date: 06/02/2024  Admitted From: home Disposition:  home  Recommendations for Outpatient Follow-up:  Follow up with PCP in 1 week Get CBC w/in 1 week to check H&H F/u outpatient w/ GI at [facility]: no  Equipment/Devices:  Discharge Condition: stable  CODE STATUS: full  Diet recommendation: Carb Modified   Brief/Interim Summary: HPI was taken from Dr. Vernon:  [name] is a 73 y.o. male with medical history significant of ischemic cardiomyopathy, CAD, paroxysmal A-fib on Eliquis , CKD status post left nephrectomy, type 2 diabetes, OSA presented with shortness of breath with exertion, fatigue since 2 weeks.   His hemoglobin dropped from 9.3-7.1 at cardiology office.  Patient denies history of per rectal bleeding, malena-he got call from his cardiologist this morning and was recommended ED evaluation and blood transfusion.  He was recommended to hold off Eliquis  until further workup done for his severe anemia.   Patient reports that he has worsening shortness of breath with exertion and feeling tired for the past 6 weeks however getting worse since 2 weeks.  No chest pain, palpitations, lightheadedness, dizziness, presyncope or syncopal episode.   Patient denies any chest pain, lightheadedness, dizziness, abdominal pain, nausea, vomiting, poor appetite, generalized weakness or lethargy.  Does not take over-the-counter NSAIDs.  His last dose of Eliquis  was yesterday   No history of tobacco abuse, alcohol  abuse however uses marijuana occasionally.  Using cane for ambulation.  Lives alone and independent on daily life activities.   ED Course: Upon arrival to ED: Patient afebrile, pulse 85, BP 117/56, maintaining oxygen saturation on room air.  NA: 135, K: 3.8, BUN: 40, creatinine 2.17, GFR 32, AST 47, ALT 49, WBC 6.5,  H&H 6.8/22.8, PLT 232.  EDP ordered 1 PRBC transfusion.  Prior hospitalist consulted for admission.  Discharge Diagnoses:  Principal Problem:   GI bleed  GI bleed:  s/p EGD and colonoscopy which showed  z-line irregular(biopsied), normal duodenum, normal stomach and diverticulosis in the entire examined colon, 12 polyps removed and multiple polyps in the entire colon. S/p 2 units of pRBCs transfused   Symptomatic anemia: secondary to above. S/p 2 units of pRBCs transfused. H&H are trending up  DM2: well controlled, HbA1c 5.8. Diet controlled  Hx of CAD: restart home dose of losartan    CHF: unknown systolic vs diastolic vs combined. Restart home dose of torsemide , lasix . Monitor I/Os   PAF: continue on home dose of amio. Holding home eliquis  x 2 days more as per GI    HTN: restart home dose of losartan     HLD: continue on statin     CKD IIIb: Cr is trending up from day prior. Avoid nephrotoxic meds    Transaminitis: etiology unclear. Labile. Will continue to monitor    Hypothyroidism: continue on home dose of levothyroxine    Obesity: BMI 31.1. Complicates overall care & prognosis   Discharge Instructions  Discharge Instructions     Diet Carb Modified   Complete by: As directed    Discharge instructions   Complete by: As directed    F/u w/ PCP in 1 week. Get CBC within 1 week to check hemoglobin. F/u w/ GI at [facility] and referral will be made by Dr. Onita.   Increase activity slowly   Complete by: As directed       Allergies as of 06/02/2024  Reactions   Other Hives, Other (See Comments)   Blue cheese Blue cheese Blue cheese        Medication List     PAUSE taking these medications    Eliquis  5 MG Tabs tablet Wait to take this until: June 04, 2024 Generic drug: apixaban  TAKE ONE TABLET BY MOUTH TWICE DAILY       STOP taking these medications    Toujeo  SoloStar 300 UNIT/ML Solostar Pen Generic drug: insulin  glargine (1 Unit Dial )       TAKE  these medications    amiodarone  200 MG tablet Commonly known as: PACERONE  TAKE ONE TABLET BY MOUTH ONCE DAILY   ascorbic acid  500 MG tablet Commonly known as: VITAMIN C  Take 1,000 mg by mouth 2 (two) times daily.   B-D ULTRAFINE III SHORT PEN 31G X 8 MM Misc Generic drug: Insulin  Pen Needle 2 (two) times daily as needed.   BROMELAIN PO Take 2 capsules by mouth daily.   CINNAMON  PO Take 1,200 mg by mouth in the morning and at bedtime. [place] Cinnamon  1200mg    CoQ10 400 MG Caps Take 400 mg by mouth 2 (two) times daily.   docusate sodium  100 MG capsule Commonly known as: COLACE 300 mg every evening. Take 300 mg in the pm & 200 mg in the am.   glipiZIDE  10 MG tablet Commonly known as: GLUCOTROL  TAKE ONE TABLET BY MOUTH TWICE DAILY What changed:  how much to take when to take this   HAWTHORNE BERRY PO Take 565 mg by mouth at bedtime.   Jardiance  10 MG Tabs tablet Generic drug: empagliflozin  TAKE ONE TABLET BY MOUTH EVERY EVENING   levothyroxine  25 MCG tablet Commonly known as: SYNTHROID  TAKE ONE TABLET BY MOUTH ONCE DAILY   losartan  25 MG tablet Commonly known as: COZAAR  Take 0.5 tablets (12.5 mg total) by mouth daily.   metolazone  2.5 MG tablet Commonly known as: ZAROXOLYN  TAKE ONE TABLET BY MOUTH ONCE DAILY   potassium chloride  10 MEQ tablet Commonly known as: KLOR-CON  Take 4 tablets (40 mEq total) by mouth daily.   PRESERVISION AREDS 2 PO Take 1 tablet by mouth in the morning and at bedtime.   rosuvastatin  20 MG tablet Commonly known as: CRESTOR  TAKE ONE TABLET BY MOUTH ONCE DAILY   tiZANidine  4 MG tablet Commonly known as: ZANAFLEX  TAKE ONE TABLET BY MOUTH AT BEDTIME AS NEEDED   torsemide  20 MG tablet Commonly known as: DEMADEX  TAKE TWO TABLETS BY MOUTH TWICE DAILY What changed: when to take this   traZODone  100 MG tablet Commonly known as: DESYREL  Take 1 tablet (100 mg total) by mouth at bedtime.   zinc gluconate 50 MG tablet Take 75 mg by  mouth daily.        Allergies[1]  Consultations: GI    Procedures/Studies: DG Chest Port 1 View Result Date: 05/30/2024 CLINICAL DATA:  Shortness of breath.  Fatigue. EXAM: PORTABLE CHEST - 1 VIEW COMPARISON:  07/16/2015 FINDINGS: Heart size at upper limits of normal. No pulmonary vascular congestion. Mediastinum within normal limits. Lungs are clear. IMPRESSION: Borderline cardiomegaly. Electronically Signed   By: Aliene Lloyd M.D.   On: 05/30/2024 15:32   (Echo, Carotid, EGD, Colonoscopy, ERCP)    Subjective:   Discharge Exam: Vitals:   06/02/24 0405 06/02/24 0814  BP: 121/69 (!) 144/77  Pulse: 73 73  Resp: 20 17  Temp: 97.9 F (36.6 C) 97.9 F (36.6 C)  SpO2: 99% 97%   Vitals:   06/01/24 1749 06/01/24  2040 06/02/24 0405 06/02/24 0814  BP: 104/63 128/69 121/69 (!) 144/77  Pulse: 62 78 73 73  Resp: 18 16 20 17   Temp:  97.6 F (36.4 C) 97.9 F (36.6 C) 97.9 F (36.6 C)  TempSrc:    Oral  SpO2: 99% 100% 99% 97%  Weight:      Height:        General: Pt is alert, awake, not in acute distress Cardiovascular:  S1/S2 +, no rubs, no gallops Respiratory: CTA bilaterally, no wheezing, no rhonchi Abdominal: Soft, NT, obese, bowel sounds + Extremities:  no cyanosis    The results of significant diagnostics from this hospitalization (including imaging, microbiology, ancillary and laboratory) are listed below for reference.     Microbiology: Recent Results (from the past 240 hours)  Resp panel by RT-PCR (RSV, Flu A&B, Covid) Anterior Nasal Swab     Status: None   Collection Time: 05/30/24  2:45 PM   Specimen: Anterior Nasal Swab  Result Value Ref Range Status   SARS Coronavirus 2 by RT PCR NEGATIVE NEGATIVE Final    Comment: (NOTE) SARS-CoV-2 target nucleic acids are NOT DETECTED.  The SARS-CoV-2 RNA is generally detectable in upper respiratory specimens during the acute phase of infection. The lowest concentration of SARS-CoV-2 viral copies this assay can  detect is 138 copies/mL. A negative result does not preclude SARS-Cov-2 infection and should not be used as the sole basis for treatment or other patient management decisions. A negative result may occur with  improper specimen collection/handling, submission of specimen other than nasopharyngeal swab, presence of viral mutation(s) within the areas targeted by this assay, and inadequate number of viral copies(<138 copies/mL). A negative result must be combined with clinical observations, patient history, and epidemiological information. The expected result is Negative.  Fact Sheet for Patients:  bloggercourse.com  Fact Sheet for Healthcare Providers:  seriousbroker.it  This test is no t yet approved or cleared by the [place]  FDA and  has been authorized for detection and/or diagnosis of SARS-CoV-2 by FDA under an Emergency Use Authorization (EUA). This EUA will remain  in effect (meaning this test can be used) for the duration of the COVID-19 declaration under Section 564(b)(1) of the Act, 21 U.S.C.section 360bbb-3(b)(1), unless the authorization is terminated  or revoked sooner.       Influenza A by PCR NEGATIVE NEGATIVE Final   Influenza B by PCR NEGATIVE NEGATIVE Final    Comment: (NOTE) The Xpert Xpress SARS-CoV-2/FLU/RSV plus assay is intended as an aid in the diagnosis of influenza from Nasopharyngeal swab specimens and should not be used as a sole basis for treatment. Nasal washings and aspirates are unacceptable for Xpert Xpress SARS-CoV-2/FLU/RSV testing.  Fact Sheet for Patients: bloggercourse.com  Fact Sheet for Healthcare Providers: seriousbroker.it  This test is not yet approved or cleared by the [place]  FDA and has been authorized for detection and/or diagnosis of SARS-CoV-2 by FDA under an Emergency Use Authorization (EUA). This EUA will remain in  effect (meaning this test can be used) for the duration of the COVID-19 declaration under Section 564(b)(1) of the Act, 21 U.S.C. section 360bbb-3(b)(1), unless the authorization is terminated or revoked.     Resp Syncytial Virus by PCR NEGATIVE NEGATIVE Final    Comment: (NOTE) Fact Sheet for Patients: bloggercourse.com  Fact Sheet for Healthcare Providers: seriousbroker.it  This test is not yet approved or cleared by the [place]  FDA and has been authorized for detection and/or diagnosis of SARS-CoV-2 by FDA under  an Emergency Use Authorization (EUA). This EUA will remain in effect (meaning this test can be used) for the duration of the COVID-19 declaration under Section 564(b)(1) of the Act, 21 U.S.C. section 360bbb-3(b)(1), unless the authorization is terminated or revoked.  Performed at [facility], [address] [place], [place] [address]      Labs: BNP (last 3 results) No results for input(s): BNP in the last 8760 hours. Basic Metabolic Panel: Recent Labs  Lab 05/30/24 1340 05/31/24 0434 06/02/24 0540  NA 135 136 136  K 3.8 3.8 3.7  CL 98 100 103  CO2 23 24 24   GLUCOSE 88 120* 88  BUN 40* 38* 29*  CREATININE 2.17* 2.22* 1.94*  CALCIUM  8.8* 9.0 9.0  MG 2.2  --   --    Liver Function Tests: Recent Labs  Lab 05/30/24 1340 05/31/24 0434 06/02/24 0540  AST 47* 47* 27  ALT 49* 56* 45*  ALKPHOS 90 90 83  BILITOT 0.3 0.4 0.5  PROT 7.0 6.9 6.4*  ALBUMIN 3.7 3.7 3.4*   No results for input(s): LIPASE, AMYLASE in the last 168 hours. No results for input(s): AMMONIA in the last 168 hours. CBC: Recent Labs  Lab 05/30/24 1340 05/31/24 0434 06/01/24 1114 06/02/24 0540  WBC 6.5 5.5 4.5 6.3  HGB 6.8* 7.5* 6.6* 8.0*  HCT 22.8* 25.4* 22.1* 26.0*  MCV 88.7 86.4 87.4 86.1  PLT 232 225 212 216   Cardiac Enzymes: No results for input(s): CKTOTAL, CKMB, CKMBINDEX, TROPONINI in  the last 168 hours. BNP: Invalid input(s): POCBNP CBG: No results for input(s): GLUCAP in the last 168 hours. D-Dimer No results for input(s): DDIMER in the last 72 hours. Hgb A1c Recent Labs    05/30/24 2134  HGBA1C 5.8*   Lipid Profile No results for input(s): CHOL, HDL, LDLCALC, TRIG, CHOLHDL, LDLDIRECT in the last 72 hours. Thyroid  function studies Recent Labs    05/30/24 1340  TSH 5.770*   Anemia work up Recent Labs    05/30/24 1340  FERRITIN 76  TIBC 389  IRON 20*   Urinalysis    Component Value Date/Time   COLORURINE YELLOW 09/07/2022 0911   APPEARANCEUR CLEAR 09/07/2022 0911   APPEARANCEUR Cloudy (A) 12/02/2019 1304   LABSPEC 1.015 09/07/2022 0911   PHURINE 7.0 09/07/2022 0911   GLUCOSEU 250 (A) 09/07/2022 0911   HGBUR NEGATIVE 09/07/2022 0911   BILIRUBINUR negative 02/14/2024 1350   BILIRUBINUR Negative 12/02/2019 1304   KETONESUR NEGATIVE 09/07/2022 0911   PROTEINUR Negative 02/14/2024 1350   PROTEINUR NEGATIVE 09/07/2022 0911   UROBILINOGEN 0.2 02/14/2024 1350   NITRITE negative 02/14/2024 1350   NITRITE NEGATIVE 09/07/2022 0911   LEUKOCYTESUR Trace (A) 02/14/2024 1350   LEUKOCYTESUR TRACE (A) 09/07/2022 0911   Sepsis Labs Recent Labs  Lab 05/30/24 1340 05/31/24 0434 06/01/24 1114 06/02/24 0540  WBC 6.5 5.5 4.5 6.3   Microbiology Recent Results (from the past 240 hours)  Resp panel by RT-PCR (RSV, Flu A&B, Covid) Anterior Nasal Swab     Status: None   Collection Time: 05/30/24  2:45 PM   Specimen: Anterior Nasal Swab  Result Value Ref Range Status   SARS Coronavirus 2 by RT PCR NEGATIVE NEGATIVE Final    Comment: (NOTE) SARS-CoV-2 target nucleic acids are NOT DETECTED.  The SARS-CoV-2 RNA is generally detectable in upper respiratory specimens during the acute phase of infection. The lowest concentration of SARS-CoV-2 viral copies this assay can detect is 138 copies/mL. A negative result does not  preclude  SARS-Cov-2 infection and should not be used as the sole basis for treatment or other patient management decisions. A negative result may occur with  improper specimen collection/handling, submission of specimen other than nasopharyngeal swab, presence of viral mutation(s) within the areas targeted by this assay, and inadequate number of viral copies(<138 copies/mL). A negative result must be combined with clinical observations, patient history, and epidemiological information. The expected result is Negative.  Fact Sheet for Patients:  bloggercourse.com  Fact Sheet for Healthcare Providers:  seriousbroker.it  This test is no t yet approved or cleared by the [place]  FDA and  has been authorized for detection and/or diagnosis of SARS-CoV-2 by FDA under an Emergency Use Authorization (EUA). This EUA will remain  in effect (meaning this test can be used) for the duration of the COVID-19 declaration under Section 564(b)(1) of the Act, 21 U.S.C.section 360bbb-3(b)(1), unless the authorization is terminated  or revoked sooner.       Influenza A by PCR NEGATIVE NEGATIVE Final   Influenza B by PCR NEGATIVE NEGATIVE Final    Comment: (NOTE) The Xpert Xpress SARS-CoV-2/FLU/RSV plus assay is intended as an aid in the diagnosis of influenza from Nasopharyngeal swab specimens and should not be used as a sole basis for treatment. Nasal washings and aspirates are unacceptable for Xpert Xpress SARS-CoV-2/FLU/RSV testing.  Fact Sheet for Patients: bloggercourse.com  Fact Sheet for Healthcare Providers: seriousbroker.it  This test is not yet approved or cleared by the [place]  FDA and has been authorized for detection and/or diagnosis of SARS-CoV-2 by FDA under an Emergency Use Authorization (EUA). This EUA will remain in effect (meaning this test can be used) for the duration of  the COVID-19 declaration under Section 564(b)(1) of the Act, 21 U.S.C. section 360bbb-3(b)(1), unless the authorization is terminated or revoked.     Resp Syncytial Virus by PCR NEGATIVE NEGATIVE Final    Comment: (NOTE) Fact Sheet for Patients: bloggercourse.com  Fact Sheet for Healthcare Providers: seriousbroker.it  This test is not yet approved or cleared by the [place]  FDA and has been authorized for detection and/or diagnosis of SARS-CoV-2 by FDA under an Emergency Use Authorization (EUA). This EUA will remain in effect (meaning this test can be used) for the duration of the COVID-19 declaration under Section 564(b)(1) of the Act, 21 U.S.C. section 360bbb-3(b)(1), unless the authorization is terminated or revoked.  Performed at [facility], [address], [place], [place] [address]      Time coordinating discharge: 37 minutes  SIGNED:   Anthony CHRISTELLA Pouch, MD  [facility] 06/02/2024, 12:22 PM Pager   If 7PM-7AM, please contact night-coverage [email or web address]     [1]  Allergies Allergen Reactions   Other Hives and Other (See Comments)    Blue cheese Blue cheese Blue cheese

## 2024-06-02 NOTE — Care Management Important Message (Signed)
 Important Message  Patient Details  Name: [name] MRN: [id number] Date of Birth: 22-Oct-1950   Important Message Given:  Yes - Medicare IM     [name] 06/02/2024, 2:03 PM

## 2024-06-02 NOTE — TOC CM/SW Note (Signed)
 Transition of Care [facility]) - Inpatient Brief Assessment   Patient Details  Name: [name] MRN: [id number] Date of Birth: 1950/06/29  Transition of Care [facility]) CM/SW Contact:    Corean ONEIDA Haddock, RN Phone Number: 06/02/2024, 1:40 PM   Clinical Narrative: Transition of Care Department [facility]) has reviewed patient and no TOC needs have been identified at this time.  If new patient transition needs arise, please place a TOC consult.    Transition of Care Asessment: Insurance and Status: Insurance coverage has been reviewed Patient has primary care physician: Yes     Prior/Current Home Services: No current home services Social Drivers of Health Review: SDOH reviewed no interventions necessary Readmission risk has been reviewed: Yes Transition of care needs: no transition of care needs at this time

## 2024-06-03 LAB — TYPE AND SCREEN
ABO/RH(D): O POS
Antibody Screen: NEGATIVE
Unit division: 0
Unit division: 0
Unit division: 0
Unit division: 0

## 2024-06-03 LAB — BPAM RBC
Blood Product Expiration Date: 202601252359
Blood Product Expiration Date: 202601252359
Blood Product Expiration Date: 202601262359
Blood Product Expiration Date: 202601262359
ISSUE DATE / TIME: 202512271552
ISSUE DATE / TIME: 202512291253
Unit Type and Rh: 5100
Unit Type and Rh: 5100
Unit Type and Rh: 5100
Unit Type and Rh: 5100

## 2024-06-03 LAB — SURGICAL PATHOLOGY

## 2024-06-05 ENCOUNTER — Other Ambulatory Visit: Payer: Self-pay | Admitting: Internal Medicine

## 2024-06-05 DIAGNOSIS — I480 Paroxysmal atrial fibrillation: Secondary | ICD-10-CM

## 2024-06-05 DIAGNOSIS — I5022 Chronic systolic (congestive) heart failure: Secondary | ICD-10-CM

## 2024-06-05 NOTE — Telephone Encounter (Signed)
 Requested medication (s) are due for refill today: yes  Requested medication (s) are on the active medication list: yes  Last refill:  03/06/24  Future visit scheduled: yes  Notes to clinic:  Unable to refill per protocol, cannot delegate.      Requested Prescriptions  Pending Prescriptions Disp Refills   amiodarone  (PACERONE ) 200 MG tablet [Pharmacy Med Name: amiodarone  200 mg tablet] 90 tablet 3    Sig: TAKE ONE TABLET BY MOUTH ONCE DAILY     Not Delegated - Cardiovascular: Antiarrhythmic Agents - amiodarone  Failed - 06/05/2024  2:40 PM      Failed - This refill cannot be delegated      Failed - Manual Review: Eye exam recommended every 12 months      Failed - TSH in normal range and within 360 days    TSH  Date Value Ref Range Status  05/30/2024 5.770 (H) 0.350 - 4.500 uIU/mL Final    Comment:    Performed at [facility], [address] [place], [place] [address]  08/26/2020 14.400 (H) 0.450 - 4.500 uIU/mL Final         Failed - ALT in normal range and within 180 days    ALT  Date Value Ref Range Status  06/02/2024 45 (H) 0 - 44 U/L Final         Failed - Last BP in normal range    BP Readings from Last 1 Encounters:  06/02/24 (!) 144/77         Failed - Patient had chest x-ray within the last 6 months      Passed - Mg Level in normal range and within 360 days    Magnesium   Date Value Ref Range Status  05/30/2024 2.2 1.7 - 2.4 mg/dL Final    Comment:    Performed at [facility], [address] [place], [place] [address]         Passed - K in normal range and within 180 days    Potassium  Date Value Ref Range Status  06/02/2024 3.7 3.5 - 5.1 mmol/L Final         Passed - AST in normal range and within 180 days    AST  Date Value Ref Range Status  06/02/2024 27 15 - 41 U/L Final         Passed - Patient had ECG in the last 180 days      Passed - Patient is not pregnant      Passed - Last Heart Rate in normal range    Pulse  Readings from Last 1 Encounters:  06/02/24 73         Passed - Valid encounter within last 6 months    Recent Outpatient Visits           3 months ago Lumbar pain   [facility] Bernardo Fend, DO   9 months ago Hypertension, unspecified type   [facility] Bernardo Fend, [place]

## 2024-06-10 ENCOUNTER — Ambulatory Visit: Admitting: Family Medicine

## 2024-06-10 ENCOUNTER — Encounter: Payer: Self-pay | Admitting: Family Medicine

## 2024-06-10 ENCOUNTER — Ambulatory Visit: Admitting: Podiatry

## 2024-06-10 VITALS — Ht 72.0 in | Wt 229.0 lb

## 2024-06-10 VITALS — BP 108/66 | HR 81 | Resp 16 | Ht 72.0 in | Wt 214.8 lb

## 2024-06-10 DIAGNOSIS — M79674 Pain in right toe(s): Secondary | ICD-10-CM

## 2024-06-10 DIAGNOSIS — B351 Tinea unguium: Secondary | ICD-10-CM | POA: Diagnosis not present

## 2024-06-10 DIAGNOSIS — M79675 Pain in left toe(s): Secondary | ICD-10-CM | POA: Diagnosis not present

## 2024-06-10 DIAGNOSIS — B001 Herpesviral vesicular dermatitis: Secondary | ICD-10-CM

## 2024-06-10 DIAGNOSIS — D649 Anemia, unspecified: Secondary | ICD-10-CM | POA: Diagnosis not present

## 2024-06-10 DIAGNOSIS — W5301XA Bitten by mouse, initial encounter: Secondary | ICD-10-CM | POA: Diagnosis not present

## 2024-06-10 DIAGNOSIS — Z09 Encounter for follow-up examination after completed treatment for conditions other than malignant neoplasm: Secondary | ICD-10-CM

## 2024-06-10 LAB — CBC WITH DIFFERENTIAL/PLATELET
Absolute Lymphocytes: 764 {cells}/uL — ABNORMAL LOW (ref 850–3900)
Absolute Monocytes: 813 {cells}/uL (ref 200–950)
Basophils Absolute: 17 {cells}/uL (ref 0–200)
Basophils Relative: 0.2 %
Eosinophils Absolute: 33 {cells}/uL (ref 15–500)
Eosinophils Relative: 0.4 %
HCT: 30.9 % — ABNORMAL LOW (ref 39.4–51.1)
Hemoglobin: 9.1 g/dL — ABNORMAL LOW (ref 13.2–17.1)
MCH: 25.3 pg — ABNORMAL LOW (ref 27.0–33.0)
MCHC: 29.4 g/dL — ABNORMAL LOW (ref 31.6–35.4)
MCV: 85.8 fL (ref 81.4–101.7)
MPV: 10.9 fL (ref 7.5–12.5)
Monocytes Relative: 9.8 %
Neutro Abs: 6673 {cells}/uL (ref 1500–7800)
Neutrophils Relative %: 80.4 %
Platelets: 278 Thousand/uL (ref 140–400)
RBC: 3.6 Million/uL — ABNORMAL LOW (ref 4.20–5.80)
RDW: 16.6 % — ABNORMAL HIGH (ref 11.0–15.0)
Total Lymphocyte: 9.2 %
WBC: 8.3 Thousand/uL (ref 3.8–10.8)

## 2024-06-10 MED ORDER — VALACYCLOVIR HCL 1 G PO TABS: 1000.0000 mg | ORAL_TABLET | Freq: Two times a day (BID) | ORAL | 0 refills | Status: AC | PRN

## 2024-06-10 MED ORDER — DOXYCYCLINE HYCLATE 100 MG PO TABS: 100.0000 mg | ORAL_TABLET | Freq: Two times a day (BID) | ORAL | 0 refills | Status: AC

## 2024-06-10 MED ORDER — DOXYCYCLINE HYCLATE 100 MG PO TABS: 100.0000 mg | ORAL_TABLET | Freq: Two times a day (BID) | ORAL | 0 refills | Status: DC

## 2024-06-10 MED ORDER — AMOXICILLIN-POT CLAVULANATE 875-125 MG PO TABS: 1.0000 | ORAL_TABLET | Freq: Two times a day (BID) | ORAL | 0 refills | Status: DC

## 2024-06-10 NOTE — Addendum Note (Signed)
 Addended by: GLENARD MIRE F on: 06/10/2024 04:44 PM   Modules accepted: Orders

## 2024-06-10 NOTE — Progress Notes (Signed)
"  °  Subjective:  Patient ID: [name], male    DOB: 10-17-1950,  MRN: [id number]    74 y.o. male returns for at risk diabetic foot care overall doing well.  Doing well no new issues nails are thickened elongated causing pain and discomfort.  Was hospitalized in December for anemia and possible GI bleed  Review of Systems: Negative except as noted in the HPI. Denies N/V/F/Ch.   Objective:  There were no vitals filed for this visit. Body mass index is 31.06 kg/m. Constitutional Well developed. Well nourished.  Vascular Foot warm and well perfused. Capillary refill normal to all digits.   Neurologic Normal speech. Oriented to person, place, and time. Epicritic sensation to light touch grossly reduced bilaterally.  Dermatologic No recurrence of wound or ulceration.  He has thickened elongated toenails x10 bilaterally with subungual brown and yellow discoloration.  Minimal callus today  Orthopedic: Good strength and ROM    Assessment:   1. Pain due to onychomycosis of toenails of both feet         Plan:  Patient was evaluated and treated and all questions answered.   Discussed the etiology and treatment options for the condition in detail with the patient.  Recommended debridement of the nails today. Sharp and mechanical debridement performed of all painful and mycotic nails today. Nails debrided in length and thickness using a nail nipper and burr to level of comfort. Discussed treatment options including appropriate shoe gear. Follow up as needed for painful nails.  Some iatrogenic bleeding on debridement dressed with Neosporin and bandage          Return in about 4 months (around 10/08/2024) for at risk diabetic foot care.  "

## 2024-06-10 NOTE — Progress Notes (Signed)
 Name: [name]   MRN: [id number]    DOB: 04-04-1951   Date:06/10/2024       Progress Note  Subjective  Chief Complaint  Chief Complaint  Patient presents with   Hospitalization Follow-up    Feeling a little better   Discussed the use of AI scribe software for clinical note transcription with the patient, who gave verbal consent to proceed.  History of Present Illness [name] is a 74 year old male with anemia who presents for hospital discharge follow up  Admission date 05/30/2024 Discharge date 06/02/2024   He was seen by cardiologist on December 4th. A CBC revealed low hemoglobin levels, prompting a repeat test during a visit to his endocrinologist on December 26th, which showed a further decrease. Subsequent tests on December 27th and at the ER showed hemoglobin levels of 6.9, leading to a blood transfusion. Further investigation included a colonoscopy and endoscopy, which revealed multiple polyps ,normal stomach and duodenum.   He experiences shortness of breath and fatigue, which improved during hospitalization but worsened upon returning home. He attributes this to a mouse infestation in his home, which he believes is affecting his breathing. He has been attempting to control the infestation with rodenticides.  He reports a possible mouse bite on his finger, which occurred shortly after his hospital discharge, leading to an infection. He describes waking up with blood on his fingertip and suspects a mouse bite due to the infestation. He experiences pain at the site and requests antibiotics.  He has a history of hypothyroidism, with recent adjustments to his thyroid  medication dosage. He was previously on 50 mg, reduced to 25 mg, and then increased back to 50 mg after a low TSH level was noted. He also takes amiodarone , which may affect thyroid  function.  He reports a cold with phlegm and runny nose for about a month, contributing to his shortness of breath. He also  experiences recurrent cold sores, for which he requests valacyclovir .    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   Lymphedema 01/10/2022   Chronic venous insufficiency 11/08/2020   PAD (peripheral artery disease) 11/08/2020   Hematoma of left foot    Left foot pain    Cellulitis    Cellulitis in diabetic foot (HCC) 09/26/2020   Abnormal LFTs 07/19/2020   Stage 4 chronic kidney disease (HCC) 07/08/2020   Left nephrolithiasis 12/10/2019   Acute pyelonephritis 11/12/2019   Chronic heart failure with mildly reduced ejection fraction (HFmrEF) (HCC) 05/12/2018   Chronic heart failure with preserved ejection fraction (HFpEF) (HCC) 12/05/2017   NICM (nonischemic cardiomyopathy) (HCC) 04/18/2017   Non-rheumatic mitral regurgitation 04/18/2017   Stage 3b chronic kidney disease (HCC) 04/18/2017   DM (diabetes mellitus), 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/2017   Hyperlipidemia due to type 2 diabetes mellitus (HCC) 02/27/2017   Left ureteral stone 10/25/2015   Coronary artery disease involving native coronary artery of native heart without angina pectoris 09/22/2015   Acute on chronic HFrEF (heart failure with reduced ejection fraction) (HCC) 09/22/2015   First degree AV block 09/22/2015   Hypertension 09/22/2015   Hyperlipidemia associated with type 2 diabetes mellitus (HCC) 09/22/2015   Hypotension 08/09/2015   Hydronephrosis with renal and ureteral calculus obstruction    Kidney stone on left side    OSA (obstructive sleep apnea)    Hyponatremia  07/13/2015   Uncontrolled type 2 diabetes mellitus 07/13/2015   Pulmonary hypertension (HCC)    Cardiorenal syndrome    Morbid obesity (HCC)    Paroxysmal atrial fibrillation [facility])     Past Surgical History:  Procedure Laterality Date   CARDIAC CATHETERIZATION N/A 07/25/2015   Procedure:  Right/Left Heart Cath and Coronary Angiography;  Surgeon: Deatrice DELENA Cage, MD;  Location: [facility] INVASIVE CV LAB;  Service: Cardiovascular;  Laterality: N/A;   CARDIAC CATHETERIZATION     [facility]   CARDIAC CATHETERIZATION     [place], [place]   COLONOSCOPY N/A 06/01/2024   Procedure: COLONOSCOPY;  Surgeon: Onita Elspeth Sharper, DO;  Location: [facility] ENDOSCOPY;  Service: Gastroenterology;  Laterality: N/A;   CORONARY ANGIOPLASTY WITH STENT PLACEMENT     CYSTOSCOPY WITH STENT PLACEMENT Left 09/07/2015   Procedure: CYSTOSCOPY WITH STENT PLACEMENT;  Surgeon: Rosina Riis, MD;  Location: [facility] ORS;  Service: Urology;  Laterality: Left;   CYSTOSCOPY/URETEROSCOPY/HOLMIUM LASER/STENT PLACEMENT Left 10/25/2015   Procedure: CYSTOSCOPY/URETEROSCOPY/HOLMIUM LASER/STENT EXCHANGE;  Surgeon: Rosina Riis, MD;  Location: [facility] ORS;  Service: Urology;  Laterality: Left;   CYSTOSCOPY/URETEROSCOPY/HOLMIUM LASER/STENT PLACEMENT Left 05/11/2019   Procedure: CYSTOSCOPY/URETEROSCOPY/HOLMIUM LASER/STENT PLACEMENT;  Surgeon: Riis Rosina, MD;  Location: [facility] ORS;  Service: Urology;  Laterality: Left;   ESOPHAGOGASTRODUODENOSCOPY N/A 06/01/2024   Procedure: EGD (ESOPHAGOGASTRODUODENOSCOPY);  Surgeon: Onita Elspeth Sharper, DO;  Location: [facility] ENDOSCOPY;  Service: Gastroenterology;  Laterality: N/A;   EXTRACORPOREAL SHOCK WAVE LITHOTRIPSY Left 04/02/2019   Procedure: EXTRACORPOREAL SHOCK WAVE LITHOTRIPSY (ESWL);  Surgeon: Riis Rosina, MD;  Location: [facility] ORS;  Service: Urology;  Laterality: Left;   EXTRACORPOREAL SHOCK WAVE LITHOTRIPSY Left 03/05/2019   Procedure: EXTRACORPOREAL SHOCK WAVE LITHOTRIPSY (ESWL);  Surgeon: Twylla Glendia BROCKS, MD;  Location: [facility] ORS;  Service: Urology;  Laterality: Left;   EYE SURGERY Bilateral    LASER FOR GLAUCOMA   IRRIGATION AND DEBRIDEMENT FOOT Left 09/30/2020   Procedure: IRRIGATION AND DEBRIDEMENT FOOT;  Surgeon: Silva Juliene SAUNDERS, DPM;  Location: [facility] ORS;  Service:  Podiatry;  Laterality: Left;   KIDNEY SURGERY     LOWER EXTREMITY ANGIOGRAPHY Left 10/04/2020   Procedure: Lower Extremity Angiography;  Surgeon: Jama Cordella MATSU, MD;  Location: [facility] INVASIVE CV LAB;  Service: Cardiovascular;  Laterality: Left;   NEPHRECTOMY Left    NEPHROSTOMY TUBE PLACEMENT ([facility] HX) Left 11/2019   POLYPECTOMY  06/01/2024   Procedure: POLYPECTOMY, INTESTINE;  Surgeon: Onita Elspeth Sharper, DO;  Location[facility] END[facility]COPY;  Service: Gastroenterology;;   RECTAL BIOPSY  06/01/2024   Procedure: BIOPSY, duodenum gastric and esophagus;  Surgeon: Onita Elspeth Sharper, DO;  Location: [facility] ENDOSCOPY;  Service: Gastroenterology;;   RIGHT/LEFT HEART CATH AND CORONARY ANGIOGRAPHY N/A 07/12/2020   Procedure: RIGHT/LEFT HEART CATH AND CORONARY ANGIOGRAPHY;  Surgeon: Mady Bruckner, MD;  Location: [facility] INVASIVE CV LAB;  Service: Cardiovascular;  Laterality: N/A;   ROBOT ASSISTED LAPAROSCOPIC NEPHRECTOMY Left 12/10/2019   Procedure: XI ROBOTIC ASSISTED LAPAROSCOPIC NEPHRECTOMY;  Surgeon: Riis Rosina, MD;  Location: [facility] ORS;  Service: Urology;  Laterality: Left;   URETEROSCOPY WITH HOLMIUM LASER LITHOTRIPSY Left 09/07/2015   Procedure: URETEROSCOPY WITH HOLMIUM LASER LITHOTRIPSY;  Surgeon: Rosina Riis, MD;  Location: [facility] ORS;  Service: Urology;  Laterality: Left;    Family History  Problem Relation Age of Onset   Heart failure Father    Arthritis Father    Heart disease Father    Hypertension Father    Heart attack Brother    Diabetes Brother    Heart disease Brother  Hypertension Brother    Birth defects Sister    Early death Sister    Early death Brother    Heart disease Brother    Hypertension Brother    Kidney cancer Neg Hx    Bladder Cancer Neg Hx    Prostate cancer Neg Hx     Social History   Tobacco Use   Smoking status: Never   Smokeless tobacco: Never  Substance Use Topics   Alcohol  use: Not Currently    Comment: less than 1    Current  Medications[1]  Allergies[2]  I personally reviewed active problem list, medication list, allergies with the patient/caregiver today.   ROS  Ten systems reviewed and is negative except as mentioned in HPI    Objective Physical Exam CONSTITUTIONAL: Patient appears well-developed and well-nourished.  No distress. HEENT: Head atraumatic, normocephalic, neck supple. CARDIOVASCULAR: Normal rate, regular rhythm and normal heart sounds.  No murmur heard. 2 plus  BLE edema, erythema lower extremities . PULMONARY: Effort normal and breath sounds normal. No respiratory distress. MUSCULOSKELETAL: slow gait, uses a cane PSYCHIATRIC: Patient has a normal mood and affect. behavior is normal. Judgment and thought content normal.     Vitals:   06/10/24 1514  BP: 108/66  Pulse: 81  Resp: 16  SpO2: 96%  Weight: 214 lb 12.8 oz (97.4 kg)  Height: 6' (1.829 m)    Body mass index is 29.13 kg/m.  Recent Results (from the past 2160 hours)  CBC     Status: Abnormal   Collection Time: 05/13/24  4:09 PM  Result Value Ref Range   WBC 9.3 3.4 - 10.8 x10E3/uL   RBC 3.52 (L) 4.14 - 5.80 x10E6/uL   Hemoglobin 9.3 (L) 13.0 - 17.7 g/dL   Hematocrit 68.8 (L) 62.4 - 51.0 %   MCV 88 79 - 97 fL   MCH 26.4 (L) 26.6 - 33.0 pg   MCHC 29.9 (L) 31.5 - 35.7 g/dL   RDW 84.2 (H) 88.3 - 84.5 %   Platelets 253 150 - 450 x10E3/uL  Comprehensive metabolic panel with GFR     Status: Abnormal   Collection Time: 05/13/24  4:09 PM  Result Value Ref Range   Glucose 84 70 - 99 mg/dL   BUN 45 (H) 8 - 27 mg/dL   Creatinine, Ser 7.65 (H) 0.76 - 1.27 mg/dL   eGFR 29 (L) >40 fO/fpw/8.26   BUN/Creatinine Ratio 19 10 - 24   Sodium 142 134 - 144 mmol/L   Potassium 5.6 (H) 3.5 - 5.2 mmol/L   Chloride 104 96 - 106 mmol/L   CO2 24 20 - 29 mmol/L   Calcium  9.9 8.6 - 10.2 mg/dL   Total Protein 7.4 6.0 - 8.5 g/dL   Albumin 4.2 3.8 - 4.8 g/dL   Globulin, Total 3.2 1.5 - 4.5 g/dL   Bilirubin Total 0.3 0.0 - 1.2 mg/dL    Alkaline Phosphatase 85 47 - 123 IU/L   AST 26 0 - 40 IU/L   ALT 21 0 - 44 IU/L  Comprehensive metabolic panel     Status: Abnormal   Collection Time: 05/30/24  1:40 PM  Result Value Ref Range   Sodium 135 135 - 145 mmol/L   Potassium 3.8 3.5 - 5.1 mmol/L   Chloride 98 98 - 111 mmol/L   CO2 23 22 - 32 mmol/L   Glucose, Bld 88 70 - 99 mg/dL    Comment: Glucose reference range applies only to samples taken after fasting for at  least 8 hours.   BUN 40 (H) 8 - 23 mg/dL   Creatinine, Ser 7.82 (H) 0.61 - 1.24 mg/dL   Calcium  8.8 (L) 8.9 - 10.3 mg/dL   Total Protein 7.0 6.5 - 8.1 g/dL   Albumin 3.7 3.5 - 5.0 g/dL   AST 47 (H) 15 - 41 U/L   ALT 49 (H) 0 - 44 U/L   Alkaline Phosphatase 90 38 - 126 U/L   Total Bilirubin 0.3 0.0 - 1.2 mg/dL   GFR, Estimated 32 (L) >60 mL/min    Comment: (NOTE) Calculated using the CKD-EPI Creatinine Equation (2021)    Anion gap 14 5 - 15    Comment: Performed at [facility], [address] [place], [place] [address]  CBC     Status: Abnormal   Collection Time: 05/30/24  1:40 PM  Result Value Ref Range   WBC 6.5 4.0 - 10.5 K/uL   RBC 2.57 (L) 4.22 - 5.81 MIL/uL   Hemoglobin 6.8 (L) 13.0 - 17.0 g/dL   HCT 77.1 (L) 60.9 - 47.9 %   MCV 88.7 80.0 - 100.0 fL   MCH 26.5 26.0 - 34.0 pg   MCHC 29.8 (L) 30.0 - 36.0 g/dL   RDW 80.3 (H) 88.4 - 84.4 %   Platelets 232 150 - 400 K/uL   nRBC 0.0 0.0 - 0.2 %    Comment: Performed at [facility], [address] [place], [place] [address]  Type and screen [facility]     Status: None   Collection Time: 05/30/24  1:40 PM  Result Value Ref Range   ABO/RH(D) O POS    Antibody Screen NEG    Sample Expiration 06/02/2024,2359    Unit Number [id number]    Blood Component Type RED CELLS,LR    Unit division 00    Status of Unit ISSUED,FINAL    Transfusion Status OK TO TRANSFUSE    Crossmatch Result COMPATIBLE    Unit Number [id number]    Blood Component Type RED  CELLS,LR    Unit division 00    Status of Unit ISSUED,FINAL    Transfusion Status OK TO TRANSFUSE    Crossmatch Result COMPATIBLE    Unit Number [id number]    Blood Component Type RED CELLS,LR    Unit division 00    Status of Unit REL FROM [facility]    Transfusion Status OK TO TRANSFUSE    Crossmatch Result COMPATIBLE    Unit Number [id number]    Blood Component Type RED CELLS,LR    Unit division 00    Status of Unit REL FROM [facility]    Transfusion Status OK TO TRANSFUSE    Crossmatch Result COMPATIBLE   Troponin T, High Sensitivity     Status: Abnormal   Collection Time: 05/30/24  1:40 PM  Result Value Ref Range   Troponin T High Sensitivity 57 (H) 0 - 19 ng/L    Comment: (NOTE) Biotin concentrations > 1000 ng/mL falsely decrease TnT results.  Serial cardiac troponin measurements are suggested.  Refer to the Links section for chest pain algorithms and additional  guidance. Performed at [facility], [address] [place], [place] [address]   Magnesium      Status: None   Collection Time: 05/30/24  1:40 PM  Result Value Ref Range   Magnesium  2.2 1.7 - 2.4 mg/dL    Comment: Performed at [facility], [address] [place], [place] [address]  Iron and TIBC  Status: Abnormal   Collection Time: 05/30/24  1:40 PM  Result Value Ref Range   Iron 20 (L) 45 - 182 ug/dL   TIBC 610 749 - 549 ug/dL   Saturation Ratios 5 (L) 17.9 - 39.5 %   UIBC 369 ug/dL    Comment: Performed at [facility], [address] [place], [place] [address]  Ferritin     Status: None   Collection Time: 05/30/24  1:40 PM  Result Value Ref Range   Ferritin 76 24 - 336 ng/mL    Comment: Performed at [facility], [address] [place], [place] [address]  TSH     Status: Abnormal   Collection Time: 05/30/24  1:40 PM  Result Value Ref Range   TSH 5.770 (H) 0.350 - 4.500 uIU/mL    Comment: Performed at [facility], [address]  [place], [place] [address]  BPAM RBC     Status: None   Collection Time: 05/30/24  1:40 PM  Result Value Ref Range   ISSUE DATE / TIME 797487728447    Blood Product Unit Number [id number]    PRODUCT CODE [id number]    Unit Type and Rh 5100    Blood Product Expiration Date 797398737640    ISSUE DATE / TIME 797487708746    Blood Product Unit Number [id number]    PRODUCT CODE [id number]    Unit Type and Rh 5100    Blood Product Expiration Date 797398737640    Blood Product Unit Number [id number]    PRODUCT CODE [id number]    Unit Type and Rh 5100    Blood Product Expiration Date 797398747640    Blood Product Unit Number [id number]    PRODUCT CODE [id number]    Unit Type and Rh 5100    Blood Product Expiration Date 797398747640   Prepare RBC (crossmatch)     Status: None   Collection Time: 05/30/24  2:30 PM  Result Value Ref Range   Order Confirmation      ORDER PROCESSED BY BLOOD BANK Performed at [facility], [address] [place], [place] [address]   Resp panel by RT-PCR (RSV, Flu A&B, Covid) Anterior Nasal Swab     Status: None   Collection Time: 05/30/24  2:45 PM   Specimen: Anterior Nasal Swab  Result Value Ref Range   SARS Coronavirus 2 by RT PCR NEGATIVE NEGATIVE    Comment: (NOTE) SARS-CoV-2 target nucleic acids are NOT DETECTED.  The SARS-CoV-2 RNA is generally detectable in upper respiratory specimens during the acute phase of infection. The lowest concentration of SARS-CoV-2 viral copies this assay can detect is 138 copies/mL. A negative result does not preclude SARS-Cov-2 infection and should not be used as the sole basis for treatment or other patient management decisions. A negative result may occur with  improper specimen collection/handling, submission of specimen other than nasopharyngeal swab, presence of viral mutation(s) within the areas targeted by this assay, and inadequate number of viral copies(<138 copies/mL). A negative result must be  combined with clinical observations, patient history, and epidemiological information. The expected result is Negative.  Fact Sheet for Patients:  bloggercourse.com  Fact Sheet for Healthcare Providers:  seriousbroker.it  This test is no t yet approved or cleared by the [place]  FDA and  has been authorized for detection and/or diagnosis of SARS-CoV-2 by FDA under an Emergency Use Authorization (EUA). This EUA will remain  in effect (meaning this test can be used) for the duration of the COVID-19  declaration under Section 564(b)(1) of the Act, 21 U.S.C.section 360bbb-3(b)(1), unless the authorization is terminated  or revoked sooner.       Influenza A by PCR NEGATIVE NEGATIVE   Influenza B by PCR NEGATIVE NEGATIVE    Comment: (NOTE) The Xpert Xpress SARS-CoV-2/FLU/RSV plus assay is intended as an aid in the diagnosis of influenza from Nasopharyngeal swab specimens and should not be used as a sole basis for treatment. Nasal washings and aspirates are unacceptable for Xpert Xpress SARS-CoV-2/FLU/RSV testing.  Fact Sheet for Patients: bloggercourse.com  Fact Sheet for Healthcare Providers: seriousbroker.it  This test is not yet approved or cleared by the [place]  FDA and has been authorized for detection and/or diagnosis of SARS-CoV-2 by FDA under an Emergency Use Authorization (EUA). This EUA will remain in effect (meaning this test can be used) for the duration of the COVID-19 declaration under Section 564(b)(1) of the Act, 21 U.S.C. section 360bbb-3(b)(1), unless the authorization is terminated or revoked.     Resp Syncytial Virus by PCR NEGATIVE NEGATIVE    Comment: (NOTE) Fact Sheet for Patients: bloggercourse.com  Fact Sheet for Healthcare Providers: seriousbroker.it  This test is not yet approved or cleared  by the [place]  FDA and has been authorized for detection and/or diagnosis of SARS-CoV-2 by FDA under an Emergency Use Authorization (EUA). This EUA will remain in effect (meaning this test can be used) for the duration of the COVID-19 declaration under Section 564(b)(1) of the Act, 21 U.S.C. section 360bbb-3(b)(1), unless the authorization is terminated or revoked.  Performed at [facility], [address] [place], [place] [address]   Hemoglobin A1c     Status: Abnormal   Collection Time: 05/30/24  9:34 PM  Result Value Ref Range   Hgb A1c MFr Bld 5.8 (H) 4.8 - 5.6 %    Comment: (NOTE) Diagnosis of Diabetes The following HbA1c ranges recommended by the American Diabetes Association (ADA) may be used as an aid in the diagnosis of diabetes mellitus.  Hemoglobin             Suggested A1C NGSP%              Diagnosis  <5.7                   Non Diabetic  5.7-6.4                Pre-Diabetic  >6.4                   Diabetic  <7.0                   Glycemic control for                       adults with diabetes.     Mean Plasma Glucose 119.76 mg/dL    Comment: Performed at [facility], [address], [place], [place] [address]  Troponin T, High Sensitivity     Status: Abnormal   Collection Time: 05/30/24  9:34 PM  Result Value Ref Range   Troponin T High Sensitivity 49 (H) 0 - 19 ng/L    Comment: (NOTE) Biotin concentrations > 1000 ng/mL falsely decrease TnT results.  Serial cardiac troponin measurements are suggested.  Refer to the Links section for chest pain algorithms and additional  guidance. Performed at [facility], [address], [place], [place] [address]   CBC     Status:  Abnormal   Collection Time: 05/31/24  4:34 AM  Result Value Ref Range   WBC 5.5 4.0 - 10.5 K/uL   RBC 2.94 (L) 4.22 - 5.81 MIL/uL   Hemoglobin 7.5 (L) 13.0 - 17.0 g/dL   HCT 74.5 (L) 60.9 - 47.9 %   MCV 86.4 80.0 - 100.0 fL   MCH 25.5 (L) 26.0 - 34.0  pg   MCHC 29.5 (L) 30.0 - 36.0 g/dL   RDW 80.2 (H) 88.4 - 84.4 %   Platelets 225 150 - 400 K/uL   nRBC 0.0 0.0 - 0.2 %    Comment: Performed at [facility], [address], [place], [place] [address]  Comprehensive metabolic panel     Status: Abnormal   Collection Time: 05/31/24  4:34 AM  Result Value Ref Range   Sodium 136 135 - 145 mmol/L   Potassium 3.8 3.5 - 5.1 mmol/L   Chloride 100 98 - 111 mmol/L   CO2 24 22 - 32 mmol/L   Glucose, Bld 120 (H) 70 - 99 mg/dL    Comment: Glucose reference range applies only to samples taken after fasting for at least 8 hours.   BUN 38 (H) 8 - 23 mg/dL   Creatinine, Ser 7.77 (H) 0.61 - 1.24 mg/dL   Calcium  9.0 8.9 - 10.3 mg/dL   Total Protein 6.9 6.5 - 8.1 g/dL   Albumin 3.7 3.5 - 5.0 g/dL   AST 47 (H) 15 - 41 U/L   ALT 56 (H) 0 - 44 U/L   Alkaline Phosphatase 90 38 - 126 U/L   Total Bilirubin 0.4 0.0 - 1.2 mg/dL   GFR, Estimated 31 (L) >60 mL/min    Comment: (NOTE) Calculated using the CKD-EPI Creatinine Equation (2021)    Anion gap 11 5 - 15    Comment: Performed at [facility], [address] [place], [place] [address]  Surgical pathology     Status: None   Collection Time: 06/01/24 12:00 AM  Result Value Ref Range   SURGICAL PATHOLOGY      SURGICAL PATHOLOGY [facility] [address] [place], [place] [address] Telephone [phone] or [phone] Fax [phone]  REPORT OF SURGICAL PATHOLOGY   Accession #: [id number] Patient Name: [name], [name] Visit # : [id number]  MRN: [id number] Physician: Onita Standing DOB/Age [birth year] (Age: 74) Gender: M Collected Date: 06/01/2024 Received Date: 06/02/2024  FINAL DIAGNOSIS       1. Duodenum, Biopsy, cbx :       REACTIVE DUODENAL MUCOSA SHOWING FOCAL GASTRIC METAPLASIA COMPATIBLE WITH PEPTIC      DUODENITIS       2. Stomach, biopsy, cbx random :       REACTIVE GASTROPATHY WITH MINIMAL CHRONIC GASTRITIS      NEGATIVE FOR  H. PYLORI, INTESTINAL METAPLASIA, DYSPLASIA AND CARCINOMA       3. Esophagogastric junction, biopsy, cbx :       REACTIVE SQUAMOUS MUCOSA WITH CHANGES SUGGESTIVE OF REFLUX ESOPHAGITIS      MILD CHRONIC GASTRITIS WITH REACTIVE EPITHELIAL CHANGES      NEGATIVE FOR INTESTINAL METAPLASIA, DYSPLASIA AND C ARCINOMA       4. Rectum, polyp(s), cold snare x3 :       HYPERPLASTIC POLYPS, 3 FRAGMENTS      NEGATIVE FOR DYSPLASIA AND CARCINOMA       5. Transverse Colon Polyp, cold snare x6 :       TUBULAR ADENOMA, 14 FRAGMENTS  NEGATIVE FOR HIGH-GRADE DYSPLASIA AND CARCINOMA       6. Cecum Polyp, hot snare :       TUBULAR ADENOMA, MULTIPLE FRAGMENTS      NEGATIVE FOR HIGH-GRADE DYSPLASIA AND CARCINOMA       7. Ascending  Colon Polyp, cold snare x2 :       TUBULAR ADENOMA, 4 FRAGMENTS      NEGATIVE FOR HIGH-GRADE DYSPLASIA AND CARCINOMA       ELECTRONIC SIGNATURE : [place] Md, Fred , Sports Administrator, International Aid/development Worker  MICROSCOPIC DESCRIPTION  CASE COMMENTS STAINS USED IN DIAGNOSIS: H&E H&E H&E H&E H&E-2 H&E H&E H&E H&E H&E H&E H&E    CLINICAL HISTORY  SPECIMEN(S) OBTAINED 1. Duodenum, Biopsy, Cbx 2. Stomach, biopsy, Cbx Random 3. Esophagogastric junction, biopsy, Cbx 4. Rectum, polyp(s), Cold Snare X3 5. Transverse Colon Polyp, Cold Snar e X6 6. Cecum Polyp, Hot Snare 7. Ascending  Colon Polyp, Cold Snare X2  SPECIMEN COMMENTS: 1. r/o celiac disease 2. r/o h pylori 3. r/o Barrett's disease SPECIMEN CLINICAL INFORMATION: 1. IDA.Irreg.Z line, colon polyps, diverticulosis    Gross Description 1. Received in formalin are tan, soft tissue fragments that are submitted in toto.Number:  3  Size:  0.4 cm,  1 block. 2. Received in formalin are tan, soft tissue fragments that are submitted in toto.Number:  4  Size:  0.5 cm,  1 block. 3. Received in formalin are tan, soft tissue fragments that are submitted in toto.Number:  3  Size:  0.4 cm,  1 block. 4.  Received in formalin are tan, soft tissue fragments that are submitted in toto.Number:  4  Size:  1.7 cm,  1 block. 5. Received in formalin are tan, soft tissue fragments that are submitted in toto.Number:  multiple  Size:  0.7 cm,  3 blocks. 6. Received in formalin are tan, soft tissue fragments that are submitted in toto.Number:  multiple  Size:  0.6 cm,   3 blocks. 7. Received in formalin are tan, soft tissue fragments that are submitted in toto.Number:  2  Size:  0.4 cm,  1 block.mb 06-02-24        Report signed out from the following location(s) [facility] [address] [place], [place] [address] CLIA #: [id number]  [facility] [address] [place], [place] [address] CLIA #: [id number]   CBC     Status: Abnormal   Collection Time: 06/01/24 11:14 AM  Result Value Ref Range   WBC 4.5 4.0 - 10.5 K/uL   RBC 2.53 (L) 4.22 - 5.81 MIL/uL   Hemoglobin 6.6 (L) 13.0 - 17.0 g/dL   HCT 77.8 (L) 60.9 - 47.9 %   MCV 87.4 80.0 - 100.0 fL   MCH 26.1 26.0 - 34.0 pg   MCHC 29.9 (L) 30.0 - 36.0 g/dL   RDW 80.6 (H) 88.4 - 84.4 %   Platelets 212 150 - 400 K/uL   nRBC 0.0 0.0 - 0.2 %    Comment: Performed at [facility], [address], [place], [place] [address]  Prepare RBC (crossmatch)     Status: None   Collection Time: 06/01/24 12:25 PM  Result Value Ref Range   Order Confirmation      ORDER PROCESSED BY BLOOD BANK Performed at [facility], [address] [place], [place] [address]   CBC     Status: Abnormal   Collection Time: 06/02/24  5:40 AM  Result Value Ref Range  WBC 6.3 4.0 - 10.5 K/uL   RBC 3.02 (L) 4.22 - 5.81 MIL/uL   Hemoglobin 8.0 (L) 13.0 - 17.0 g/dL   HCT 73.9 (L) 60.9 - 47.9 %   MCV 86.1 80.0 - 100.0 fL   MCH 26.5 26.0 - 34.0 pg   MCHC 30.8 30.0 - 36.0 g/dL   RDW 81.3 (H) 88.4 - 84.4 %   Platelets 216 150 - 400 K/uL   nRBC 0.0 0.0 - 0.2 %    Comment: Performed at [facility], [address] [place], [place] [address]  Comprehensive metabolic panel     Status: Abnormal   Collection Time: 06/02/24  5:40 AM  Result Value Ref Range   Sodium 136 135 - 145 mmol/L   Potassium 3.7 3.5 - 5.1 mmol/L   Chloride 103 98 - 111 mmol/L   CO2 24 22 - 32 mmol/L   Glucose, Bld 88 70 - 99 mg/dL    Comment: Glucose reference range applies only to samples taken after fasting for at least 8 hours.   BUN 29 (H) 8 - 23 mg/dL   Creatinine, Ser 8.05 (H) 0.61 - 1.24 mg/dL   Calcium  9.0 8.9 - 10.3 mg/dL   Total Protein 6.4 (L) 6.5 - 8.1 g/dL   Albumin 3.4 (L) 3.5 - 5.0 g/dL   AST 27 15 - 41 U/L   ALT 45 (H) 0 - 44 U/L   Alkaline Phosphatase 83 38 - 126 U/L   Total Bilirubin 0.5 0.0 - 1.2 mg/dL   GFR, Estimated 36 (L) >60 mL/min    Comment: (NOTE) Calculated using the CKD-EPI Creatinine Equation (2021)    Anion gap 9 5 - 15    Comment: Performed at [facility], [address] [place], [place] [address]     PHQ2/9:    06/10/2024    3:14 PM 04/10/2024   11:56 AM 08/15/2023    1:12 PM 02/14/2023    1:36 PM 09/27/2022    2:09 PM  Depression screen PHQ 2/9  Decreased Interest 0 0 0 2 1  Down, Depressed, Hopeless 0 0 0 1 1  PHQ - 2 Score 0 0 0 3 2  Altered sleeping  0  3 0  Tired, decreased energy  0  3 1  Change in appetite  0  0 0  Feeling bad or failure about yourself   0  1 0  Trouble concentrating  0  0 0  Moving slowly or fidgety/restless  0  0 0  Suicidal thoughts  0  0 0  PHQ-9 Score  0  10  3   Difficult doing work/chores  Not difficult at all  Not difficult at all Not difficult at all     Data saved with a previous flowsheet row definition    phq 9 is negative  Fall Risk:    06/10/2024    3:14 PM 04/10/2024   11:51 AM 10/29/2023   11:38 PM 08/15/2023    1:12 PM 02/14/2023    1:36 PM  Fall Risk   Falls in the past year? 1 1 0  0 1  Number falls in past yr: 0 0 0  0 0  Injury with Fall? 0 0  0   0  0   Risk for fall due to : Impaired balance/gait  History of fall(s);Impaired balance/gait  No Fall Risks   Follow up Falls evaluation completed Falls evaluation completed;Education provided  Falls evaluation completed  Manually entered by patient   Data saved with a previous flowsheet row definition      Assessment & Plan Symptomatic anemia/hospital discharge follow up Hemoglobin decreased from 12.5 to 6.9, requiring transfusions. Polyps found on colonoscopy and endoscopy, no bleeding source identified. Possible small intestine bleeding. Symptoms improved in hospital, declined post-discharge. - Checked CBC to monitor hemoglobin. - Follow up with GI specialists at [facility] main campus for polyps management. - Consider capsule endoscopy if bleeding source undetermined. - Monitor anemia symptoms, consider transfusion if hemoglobin <7.  Mouse bite with secondary skin infection Mouse bite on finger led to infection, likely due to home infestation. - Prescribed Doxy. Currently not having any fever.  - he  lives alone and may have to discuss help with child psychotherapist. Advised to discuss with his pcp  Hypothyroidism Managed with thyroid  medication. Recent low TSH led to increased dosage. Amiodarone  may affect thyroid  function. - Continue thyroid  medication at 50 mg.        [1]  Current Outpatient Medications:    amiodarone  (PACERONE ) 200 MG tablet, TAKE ONE TABLET BY MOUTH ONCE DAILY, Disp: 90 tablet, Rfl: 0   B-D ULTRAFINE III SHORT PEN 31G X 8 MM MISC, 2 (two) times daily as needed., Disp: , Rfl:    Bromelains (BROMELAIN PO), Take 2 capsules by mouth daily., Disp: , Rfl:    CINNAMON  PO, Take 1,200 mg by mouth in the morning and at bedtime. [place] Cinnamon  1200mg , Disp: , Rfl:    Coenzyme Q10 (COQ10) 400 MG CAPS, Take 400 mg by mouth 2 (two) times daily., Disp: , Rfl:    docusate sodium  (COLACE) 100 MG capsule, 300 mg every evening. Take 300 mg in the pm & 200 mg in the am., Disp: , Rfl:    ELIQUIS  5 MG TABS tablet, TAKE ONE TABLET  BY MOUTH TWICE DAILY, Disp: 60 tablet, Rfl: 5   glipiZIDE  (GLUCOTROL ) 10 MG tablet, TAKE ONE TABLET BY MOUTH TWICE DAILY (Patient taking differently: Take 20 mg by mouth daily.), Disp: 180 tablet, Rfl: 0   HAWTHORNE BERRY PO, Take 565 mg by mouth at bedtime., Disp: , Rfl:    JARDIANCE  10 MG TABS tablet, TAKE ONE TABLET BY MOUTH EVERY EVENING, Disp: 90 tablet, Rfl: 1   levothyroxine  (SYNTHROID ) 25 MCG tablet, TAKE ONE TABLET BY MOUTH ONCE DAILY, Disp: 90 tablet, Rfl: 1   losartan  (COZAAR ) 25 MG tablet, Take 0.5 tablets (12.5 mg total) by mouth daily., Disp: 90 tablet, Rfl: 1   metolazone  (ZAROXOLYN ) 2.5 MG tablet, TAKE ONE TABLET BY MOUTH ONCE DAILY, Disp: 90 tablet, Rfl: 0   Multiple Vitamins-Minerals (PRESERVISION AREDS 2 PO), Take 1 tablet by mouth in the morning and at bedtime., Disp: , Rfl:    potassium chloride  (KLOR-CON ) 10 MEQ tablet, Take 4 tablets (40 mEq total) by mouth daily., Disp: 336 tablet, Rfl: 3   rosuvastatin  (CRESTOR ) 20 MG tablet, TAKE ONE TABLET BY MOUTH ONCE DAILY, Disp: 90 tablet, Rfl: 0   tiZANidine  (ZANAFLEX ) 4 MG tablet, TAKE ONE TABLET BY MOUTH AT BEDTIME AS NEEDED, Disp: 30 tablet, Rfl: 0   torsemide  (DEMADEX ) 20 MG tablet, TAKE TWO TABLETS BY MOUTH TWICE DAILY (Patient taking differently: Take 40 mg by mouth daily.), Disp: 180 tablet, Rfl: 0   traZODone  (DESYREL ) 100 MG tablet, Take 1 tablet (100 mg total) by mouth at bedtime., Disp: 90 tablet, Rfl: 1   vitamin C  (ASCORBIC ACID ) 500 MG tablet, Take 1,000 mg by mouth 2 (two) times daily., Disp: ,  Rfl:    zinc gluconate 50 MG tablet, Take 75 mg by mouth daily., Disp: , Rfl:  [2]  Allergies Allergen Reactions   Other Hives and Other (See Comments)    Blue cheese Blue cheese Blue cheese

## 2024-06-11 ENCOUNTER — Ambulatory Visit: Payer: Self-pay | Admitting: Family Medicine

## 2024-07-07 DIAGNOSIS — Z905 Acquired absence of kidney: Secondary | ICD-10-CM | POA: Insufficient documentation

## 2024-07-07 NOTE — Assessment & Plan Note (Signed)
 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

## 2024-07-09 ENCOUNTER — Ambulatory Visit: Admitting: Urology

## 2024-07-09 VITALS — BP 78/44 | HR 73 | Ht 72.0 in | Wt 227.0 lb

## 2024-07-09 DIAGNOSIS — Z905 Acquired absence of kidney: Secondary | ICD-10-CM

## 2024-07-09 NOTE — Assessment & Plan Note (Addendum)
 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

## 2024-07-09 NOTE — Progress Notes (Signed)
" ° °  07/09/2024 3:36 PM   Alm Mcardle [facility] [birth year] [phone]  Reason for visit: Follow up nephrolithiasis, solitary right kidney   HPI: 74 y.o. male, initial follow up with me today, previously seen by Dr. Penne in Apr 2024  Monitoring solitary right kidney, history of chronic nephrolithiasis Today was our first meeting, very pleasant man 6-8 weeks of subacute right flank pain -he wants to know if passing a kidney stone Denies any LUTS No GH  Ongoing workup for anemia of unclear etiology requiring transfusion, SOB, fatigue  Prior HPI: History of chronic nephrolithiasis  History of left XGP kidney  - s/p Left nephrectomy in 2021    Physical Exam: BP (!) 78/44   Pulse 73   Ht 6' (1.829 m)   Wt 227 lb (103 kg)   BMI 30.79 kg/m    Constitutional:  Alert and oriented, No acute distress.  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

## 2024-07-09 NOTE — Patient Instructions (Signed)
 Schedule u/s  [phone]

## 2024-07-10 ENCOUNTER — Encounter: Payer: Self-pay | Admitting: Internal Medicine

## 2024-07-10 DIAGNOSIS — Z79899 Other long term (current) drug therapy: Secondary | ICD-10-CM

## 2024-07-10 NOTE — Telephone Encounter (Signed)
 Please check a CBC and BMP at that time.  Thanks.  Lonni Hanson, MD [employer]

## 2024-08-12 ENCOUNTER — Other Ambulatory Visit

## 2024-08-13 ENCOUNTER — Ambulatory Visit: Admitting: Internal Medicine

## 2024-08-19 ENCOUNTER — Ambulatory Visit: Admitting: Urology

## 2024-10-07 ENCOUNTER — Ambulatory Visit: Admitting: Podiatry
